# Patient Record
Sex: Female | Born: 1965
Health system: Southern US, Community
[De-identification: ages and names within clinical notes are randomized; demographics above are authoritative.]

## PROBLEM LIST (undated history)

## (undated) ENCOUNTER — Emergency Department (HOSPITAL_COMMUNITY): Discharge status: Against Medical Advice | Payer: Medicare Other

## (undated) DIAGNOSIS — I739 Peripheral vascular disease, unspecified: Secondary | ICD-10-CM

## (undated) DIAGNOSIS — F419 Anxiety disorder, unspecified: Secondary | ICD-10-CM

## (undated) DIAGNOSIS — K760 Fatty (change of) liver, not elsewhere classified: Secondary | ICD-10-CM

## (undated) DIAGNOSIS — J189 Pneumonia, unspecified organism: Secondary | ICD-10-CM

## (undated) DIAGNOSIS — R51 Headache: Secondary | ICD-10-CM

## (undated) DIAGNOSIS — K5792 Diverticulitis of intestine, part unspecified, without perforation or abscess without bleeding: Secondary | ICD-10-CM

## (undated) DIAGNOSIS — R451 Restlessness and agitation: Secondary | ICD-10-CM

## (undated) DIAGNOSIS — E119 Type 2 diabetes mellitus without complications: Secondary | ICD-10-CM

## (undated) DIAGNOSIS — T4145XA Adverse effect of unspecified anesthetic, initial encounter: Secondary | ICD-10-CM

## (undated) DIAGNOSIS — C801 Malignant (primary) neoplasm, unspecified: Secondary | ICD-10-CM

## (undated) DIAGNOSIS — K219 Gastro-esophageal reflux disease without esophagitis: Secondary | ICD-10-CM

## (undated) DIAGNOSIS — T8859XA Other complications of anesthesia, initial encounter: Secondary | ICD-10-CM

## (undated) DIAGNOSIS — E785 Hyperlipidemia, unspecified: Secondary | ICD-10-CM

## (undated) DIAGNOSIS — I10 Essential (primary) hypertension: Secondary | ICD-10-CM

## (undated) DIAGNOSIS — Z87442 Personal history of urinary calculi: Secondary | ICD-10-CM

## (undated) DIAGNOSIS — F32A Depression, unspecified: Secondary | ICD-10-CM

## (undated) DIAGNOSIS — J449 Chronic obstructive pulmonary disease, unspecified: Secondary | ICD-10-CM

## (undated) DIAGNOSIS — I639 Cerebral infarction, unspecified: Secondary | ICD-10-CM

## (undated) HISTORY — DX: Peripheral vascular disease, unspecified: I73.9

## (undated) HISTORY — PX: REPLACEMENT TOTAL KNEE BILATERAL: SUR1225

## (undated) HISTORY — PX: HERNIA REPAIR: SHX51

## (undated) HISTORY — PX: BACK SURGERY: SHX140

## (undated) HISTORY — DX: Hyperlipidemia, unspecified: E78.5

## (undated) HISTORY — PX: ABDOMINAL HYSTERECTOMY: SHX81

## (undated) HISTORY — PX: EYE SURGERY: SHX253

## (undated) HISTORY — PX: JOINT REPLACEMENT: SHX530

## (undated) HISTORY — DX: Depression, unspecified: F32.A

## (undated) SURGERY — Surgical Case
Anesthesia: *Unknown

## (undated) SURGICAL SUPPLY — 11 items
BALLN LUTONIX 018 6X40X130 (BALLOONS) ×1 IMPLANT
BALLN LUTONIX DCB 4X100X130 (BALLOONS) ×1 IMPLANT
BALLN LUTONIX DCB 5X80X130 (BALLOONS) ×1 IMPLANT
DRSG SURGICEL FIBRILLAR 1X2 (HEMOSTASIS) ×1 IMPLANT
ELECT REM PT RETURN 9FT ADLT (ELECTROSURGICAL) ×1 IMPLANT
GLOVE SURG SYN 8.0 (GLOVE) ×1 IMPLANT
GRAFT VASC PATCH XENOSURE 1X14 (Vascular Products) IMPLANT
KIT STIMULAN RAPID CURE 5CC (Orthopedic Implant) IMPLANT
NDL HYPO 18GX1.5 BLUNT FILL (NEEDLE) ×1 IMPLANT
SUT ETHILON 3-0 FS-10 30 BLK (SUTURE) ×1 IMPLANT
TAG SUTURE CLAMP YLW 5PR (MISCELLANEOUS) ×1 IMPLANT

---

## 1999-07-15 ENCOUNTER — Emergency Department (HOSPITAL_COMMUNITY): Admission: EM | Admit: 1999-07-15 | Discharge: 1999-07-15 | Payer: Self-pay | Admitting: Emergency Medicine

## 1999-07-15 ENCOUNTER — Encounter: Payer: Self-pay | Admitting: Emergency Medicine

## 2001-01-15 ENCOUNTER — Encounter: Payer: Self-pay | Admitting: Emergency Medicine

## 2001-01-15 ENCOUNTER — Emergency Department (HOSPITAL_COMMUNITY): Admission: EM | Admit: 2001-01-15 | Discharge: 2001-01-15 | Payer: Self-pay | Admitting: Emergency Medicine

## 2001-06-21 ENCOUNTER — Emergency Department (HOSPITAL_COMMUNITY): Admission: EM | Admit: 2001-06-21 | Discharge: 2001-06-21 | Payer: Self-pay | Admitting: Emergency Medicine

## 2001-06-21 ENCOUNTER — Encounter: Payer: Self-pay | Admitting: Emergency Medicine

## 2001-06-23 ENCOUNTER — Encounter: Payer: Self-pay | Admitting: Emergency Medicine

## 2001-06-23 ENCOUNTER — Emergency Department (HOSPITAL_COMMUNITY): Admission: EM | Admit: 2001-06-23 | Discharge: 2001-06-23 | Payer: Self-pay | Admitting: Emergency Medicine

## 2003-11-16 ENCOUNTER — Ambulatory Visit: Payer: Self-pay | Admitting: Internal Medicine

## 2003-12-14 ENCOUNTER — Ambulatory Visit: Payer: Self-pay | Admitting: Internal Medicine

## 2004-04-22 ENCOUNTER — Emergency Department: Payer: Self-pay | Admitting: Emergency Medicine

## 2005-03-27 ENCOUNTER — Inpatient Hospital Stay: Payer: Self-pay

## 2005-03-27 ENCOUNTER — Other Ambulatory Visit: Payer: Self-pay

## 2005-05-13 ENCOUNTER — Other Ambulatory Visit: Payer: Self-pay

## 2005-05-13 ENCOUNTER — Emergency Department: Payer: Self-pay | Admitting: Emergency Medicine

## 2005-10-29 ENCOUNTER — Emergency Department: Payer: Self-pay | Admitting: General Practice

## 2005-11-15 ENCOUNTER — Other Ambulatory Visit: Payer: Self-pay

## 2005-11-15 ENCOUNTER — Emergency Department: Payer: Self-pay | Admitting: Emergency Medicine

## 2006-02-26 ENCOUNTER — Encounter: Admission: RE | Admit: 2006-02-26 | Discharge: 2006-02-26 | Payer: Self-pay | Admitting: Family Medicine

## 2006-03-12 ENCOUNTER — Emergency Department: Payer: Self-pay | Admitting: Emergency Medicine

## 2006-04-09 ENCOUNTER — Encounter: Payer: Self-pay | Admitting: Neurosurgery

## 2006-04-14 ENCOUNTER — Encounter: Payer: Self-pay | Admitting: Neurosurgery

## 2006-06-30 ENCOUNTER — Encounter: Payer: Self-pay | Admitting: Orthopedic Surgery

## 2006-07-14 ENCOUNTER — Encounter: Payer: Self-pay | Admitting: Orthopedic Surgery

## 2006-08-26 ENCOUNTER — Ambulatory Visit: Payer: Self-pay | Admitting: Pain Medicine

## 2007-08-14 ENCOUNTER — Encounter: Payer: Self-pay | Admitting: *Deleted

## 2008-01-11 ENCOUNTER — Emergency Department (HOSPITAL_COMMUNITY): Admission: EM | Admit: 2008-01-11 | Discharge: 2008-01-11 | Payer: Self-pay | Admitting: Emergency Medicine

## 2008-04-06 ENCOUNTER — Inpatient Hospital Stay (HOSPITAL_COMMUNITY): Admission: RE | Admit: 2008-04-06 | Discharge: 2008-04-09 | Payer: Self-pay | Admitting: Neurosurgery

## 2008-07-05 ENCOUNTER — Encounter: Payer: Self-pay | Admitting: Neurosurgery

## 2008-07-13 ENCOUNTER — Encounter: Payer: Self-pay | Admitting: Neurosurgery

## 2008-07-20 ENCOUNTER — Encounter: Admission: RE | Admit: 2008-07-20 | Discharge: 2008-07-20 | Payer: Self-pay | Admitting: Neurosurgery

## 2008-09-13 ENCOUNTER — Encounter: Payer: Self-pay | Admitting: Neurosurgery

## 2008-10-13 ENCOUNTER — Encounter: Payer: Self-pay | Admitting: Neurosurgery

## 2009-03-02 ENCOUNTER — Encounter: Admission: RE | Admit: 2009-03-02 | Discharge: 2009-03-02 | Payer: Self-pay | Admitting: Neurosurgery

## 2009-06-01 ENCOUNTER — Ambulatory Visit (HOSPITAL_COMMUNITY): Admission: RE | Admit: 2009-06-01 | Discharge: 2009-06-01 | Payer: Self-pay | Admitting: Neurosurgery

## 2009-06-30 ENCOUNTER — Encounter: Admission: RE | Admit: 2009-06-30 | Discharge: 2009-06-30 | Payer: Self-pay | Admitting: Neurosurgery

## 2009-11-07 ENCOUNTER — Emergency Department: Payer: Self-pay | Admitting: Emergency Medicine

## 2010-01-16 ENCOUNTER — Encounter: Payer: Self-pay | Admitting: Anesthesiology

## 2010-01-24 ENCOUNTER — Ambulatory Visit: Payer: Self-pay

## 2010-01-28 ENCOUNTER — Ambulatory Visit: Payer: Self-pay | Admitting: Unknown Physician Specialty

## 2010-01-29 ENCOUNTER — Ambulatory Visit: Payer: Self-pay | Admitting: Unknown Physician Specialty

## 2010-02-03 ENCOUNTER — Encounter: Payer: Self-pay | Admitting: Neurosurgery

## 2010-02-13 ENCOUNTER — Encounter: Payer: Self-pay | Admitting: Anesthesiology

## 2010-03-14 ENCOUNTER — Encounter: Payer: Self-pay | Admitting: Surgery

## 2010-04-01 LAB — GLUCOSE, CAPILLARY: Glucose-Capillary: 370 mg/dL — ABNORMAL HIGH (ref 70–99)

## 2010-04-01 LAB — GLUCOSE, RANDOM: Glucose, Bld: 462 mg/dL — ABNORMAL HIGH (ref 70–99)

## 2010-04-14 ENCOUNTER — Encounter: Payer: Self-pay | Admitting: Surgery

## 2010-04-25 LAB — GLUCOSE, CAPILLARY
Glucose-Capillary: 120 mg/dL — ABNORMAL HIGH (ref 70–99)
Glucose-Capillary: 128 mg/dL — ABNORMAL HIGH (ref 70–99)
Glucose-Capillary: 149 mg/dL — ABNORMAL HIGH (ref 70–99)
Glucose-Capillary: 156 mg/dL — ABNORMAL HIGH (ref 70–99)
Glucose-Capillary: 177 mg/dL — ABNORMAL HIGH (ref 70–99)
Glucose-Capillary: 182 mg/dL — ABNORMAL HIGH (ref 70–99)
Glucose-Capillary: 218 mg/dL — ABNORMAL HIGH (ref 70–99)
Glucose-Capillary: 248 mg/dL — ABNORMAL HIGH (ref 70–99)
Glucose-Capillary: 84 mg/dL (ref 70–99)

## 2010-04-25 LAB — BASIC METABOLIC PANEL
Calcium: 8.4 mg/dL (ref 8.4–10.5)
Calcium: 9.3 mg/dL (ref 8.4–10.5)
Chloride: 97 mEq/L (ref 96–112)
Creatinine, Ser: 0.76 mg/dL (ref 0.4–1.2)
GFR calc Af Amer: >60 mL/min (ref >60)
GFR calc Af Amer: >60 mL/min (ref >60)
GFR calc non Af Amer: >60 mL/min (ref >60)
GFR calc non Af Amer: >60 mL/min (ref >60)
Sodium: 135 mEq/L (ref 135–145)

## 2010-04-25 LAB — DIFFERENTIAL
Basophils Absolute: 0.1 10*3/uL (ref 0.0–0.1)
Lymphocytes Relative: 35 % (ref 12–46)
Monocytes Absolute: 0.8 10*3/uL (ref 0.1–1.0)
Monocytes Relative: 6 % (ref 3–12)
Neutro Abs: 7.6 10*3/uL (ref 1.7–7.7)

## 2010-04-25 LAB — CBC
Hemoglobin: 14.9 g/dL (ref 12.0–15.0)
Platelets: 269 10*3/uL (ref 150–400)
RBC: 4.31 MIL/uL (ref 3.87–5.11)
RBC: 4.98 MIL/uL (ref 3.87–5.11)
RDW: 13.8 % (ref 11.5–15.5)
WBC: 16 10*3/uL — ABNORMAL HIGH (ref 4.0–10.5)

## 2010-04-25 LAB — ABO/RH: ABO/RH(D): A POS

## 2010-04-25 LAB — TYPE AND SCREEN: Antibody Screen: NEGATIVE

## 2010-05-28 NOTE — Op Note (Signed)
[name], [name]                ACCOUNT NO.:  [id number]   MEDICAL RECORD NO.:  [id number]          PATIENT TYPE:  INP   LOCATION:  3009                         FACILITY:  [facility]   PHYSICIAN:  Cristi Loron, M.D.DATE OF BIRTH:  November 17, 1965   DATE OF PROCEDURE:  04/06/2008  DATE OF DISCHARGE:                               OPERATIVE REPORT   BRIEF HISTORY:  The patient is a 45 year old white female who I have  seen in the past secondary to chronic back pain.  She failed extensive  nonsurgical management and was recently referred back to me for back  pain and ruptured disk at L4-L5.  I discussed the various treatments  with the patient including surgery.  She has weighed the risks,  benefits, and alternatives of surgery and decided to proceed with a L4-  L5 interbody fusion, instrumentation, etc.   PREOPERATIVE DIAGNOSES:  L4-L5 herniated nucleus pulposus, spinal  stenosis, degenerative disk disease, lumbar radiculopathy, and lumbago.   POSTOPERATIVE DIAGNOSES:  L4-L5 herniated nucleus pulposus, spinal  stenosis, degenerative disk disease, lumbar radiculopathy, and lumbago.   PROCEDURE:  Right L4-L5 transforaminal lumbar interbody fusion with  local morselized autograft bone and Actifuse bone graft extender;  insertion of L4-L5 interbody prosthesis (Capstone PEEK interbody  prosthesis); L4-L5 posterior nonsegmental instrumentation with legacy  titanium pedicle screws and rods; L4-L5 posterolateral arthrodesis with  local morselized autograft bone, Actifuse, and Vitoss bone graft  extenders.   SURGEON:  Cristi Loron, MD   ASSISTANT:  Hilda Lias, MD   ANESTHESIA:  General endotracheal.   ESTIMATED BLOOD LOSS:  200 mL.   SPECIMENS:  None.   DRAINS:  None.   COMPLICATIONS:  None.   DESCRIPTION OF PROCEDURE:  The patient was brought to the operating room  by the Anesthesia Team.  General endotracheal anesthesia was induced.  The patient was turned on the [place]  frame.  Her lumbosacral region was  then prepared with Betadine scrub and Betadine solution.  Sterile drapes  were applied.  I injected the area to be incised with Marcaine with  epinephrine solution.  I used a scalpel to make a linear midline  incision over the L4-L5 interspace.  I used electrocautery to perform a  bilateral subperiosteal dissection exposing the spinous process and  lamina of L3, L4, L5, and the upper sacrum.  We obtained intraoperative  radiograph to confirm our location.  We then inserted the first  retractor for exposure.   We then used a high-speed drill to perform a right L4 laminotomy.  We  removed the medial aspect of the right L4-L5 facet joint.  We removed  the L4-L5 ligamentum flavum.  We performed foraminotomies about the  right L4 and L5 nerve roots.  This gave [place] lateral exposure to the L4-L5  intervertebral disk.  We incised this with a #15 blade scalpel and  performed a partial intervertebral discectomy with pituitary forceps and  curettes.  We prepared the vertebral endplates for the fusion by using  curettes to remove the soft tissue.  We then used the trial spacers and  determined to use  a 10 x 26 mm PEEK interbody prosthesis.  We prefilled  this prosthesis with local morselized autograft bone and Actifuse bone  graft extender.  Dr. Jeral Fruit then gently retracted the thecal sac  medially and I inserted the prosthesis into the interspace.  I then  turned it laterally using the bone tamps.  I should also mention we  filled anterior, posterior, and laterally to the prosthesis with a  combination of local morselized autograft bone and Actifuse bone graft  extender.  This completed the posterior lumbar interbody fusion.   We now turned our attention to posterior instrumentation.  Under  fluoroscopic guidance, we cannulated the bilateral L4-L5 pedicles with  bone probe.  We tapped the pedicles with a 6.5 mm tap and then probed  inside the tapped pedicles with  a ball probe to rule out cortical  breaches.  We then inserted 7.5 x 45 mm pedicle screws bilaterally at  the L4 and L5 pedicles under fluoroscopic guidance.  We palpated along  the aspect of the L4-L5 pedicles and noticed no cortical breeches.  We  then connected the lateral pedicle screws with lordotic rod.  We then  secured the rod in place with caps which were tightened appropriately.  This completed the instrumentation.   We now turned our attention to the posterolateral arthrodesis.  We used  high-speed drill to decorticate the remainder of the left L4-L5 facets  pars transverse process center.  We laid a combination of local  morselized autograft bone and Actifuse bone graft extender and Vitoss  bone graft extender over these decorticated posterolateral structures.  This completed the posterolateral arthrodesis.   We then obtained hemostasis using bipolar electrocautery.  We irrigated  the wound out with bacitracin solution.  We then inspected the thecal  sac and right L4-L5 nerve roots and they were well decompressed.  We  then removed the retractor and reapproximated the patient's  thoracolumbar fascia with interrupted #1 Vicryl suture, subcutaneous  tissue with interrupted 2-0 Vicryl suture, and the skin with Steri-  Strips and benzoin.  The wound was then coated with bacitracin ointment.  A sterile dressing was applied.  The drapes were removed.  The patient  was subsequently returned to supine position where she was extubated by  the Anesthesia Team and transported to the Postanesthesia Care Unit in  stable condition.  All sponge, instrument, and needle counts were  correct at the end of this case.      Cristi Loron, M.D.  Electronically Signed     JDJ/MEDQ  D:  04/06/2008  T:  04/07/2008  Job:  [id number]

## 2010-05-28 NOTE — Discharge Summary (Signed)
[name], [name]                ACCOUNT NO.:  [id number]   MEDICAL RECORD NO.:  [id number]          PATIENT TYPE:  INP   LOCATION:  3009                         FACILITY:  [facility]   PHYSICIAN:  Payton Doughty, M.D.      DATE OF BIRTH:  24-Jan-1965   DATE OF ADMISSION:  04/06/2008  DATE OF DISCHARGE:  04/09/2008                               DISCHARGE SUMMARY   ADMITTING DIAGNOSIS:  Spondylosis at L4-5, L5-S1.   DISCHARGE DIAGNOSIS:  Spondylosis at L4-5, L5-S1.   OPERATIVE PROCEDURES:  L4-5 fusion.   ATTENDING DOCTOR:  Cristi Loron, M.D.   COMPLICATIONS:  None.   DISCHARGE STATUS:  Alive and well.   BODY OF TEXT:  This is a 45 year old girl's history and physical is  recounted in the chart by Dr. Lovell Sheehan.  She had spondylosis at L4-5.  She was admitted and after ascertaining normal laboratory values  underwent L4-5 laminectomy and fusion.  Postoperatively, she has had  some back pain and leg pain seems better.  Her strength is full.  Her  incision is dry.  She had small amount of redness at the top of it.  She  had little bit of hypotension probably due to excessive medication use  which resolved when her medications were revised.  She is going to be  discharged on Percocet and Valium, also small amount of ciprofloxacin  for skin coverage.  Her followup will be in the [facility] in a  week with Dr. Lovell Sheehan.           ______________________________  Payton Doughty, M.D.     MWR/MEDQ  D:  04/09/2008  T:  04/09/2008  Job:  [id number]

## 2010-07-01 ENCOUNTER — Emergency Department: Payer: Self-pay | Admitting: *Deleted

## 2010-07-05 ENCOUNTER — Emergency Department: Payer: Self-pay | Admitting: Emergency Medicine

## 2010-07-12 ENCOUNTER — Ambulatory Visit: Payer: Self-pay | Admitting: Gastroenterology

## 2010-07-16 ENCOUNTER — Emergency Department: Payer: Self-pay | Admitting: Internal Medicine

## 2010-08-01 ENCOUNTER — Ambulatory Visit: Payer: Self-pay | Admitting: Unknown Physician Specialty

## 2010-08-08 ENCOUNTER — Inpatient Hospital Stay: Payer: Self-pay | Admitting: Unknown Physician Specialty

## 2010-09-19 ENCOUNTER — Encounter: Payer: Self-pay | Admitting: Unknown Physician Specialty

## 2010-10-14 ENCOUNTER — Encounter: Payer: Self-pay | Admitting: Unknown Physician Specialty

## 2010-11-14 ENCOUNTER — Encounter: Payer: Self-pay | Admitting: Unknown Physician Specialty

## 2010-12-14 ENCOUNTER — Emergency Department: Payer: Self-pay | Admitting: Emergency Medicine

## 2011-02-03 ENCOUNTER — Emergency Department: Payer: Self-pay | Admitting: Emergency Medicine

## 2011-02-21 ENCOUNTER — Other Ambulatory Visit: Payer: Self-pay | Admitting: Neurosurgery

## 2011-02-21 DIAGNOSIS — M549 Dorsalgia, unspecified: Secondary | ICD-10-CM

## 2011-02-28 ENCOUNTER — Ambulatory Visit
Admission: RE | Admit: 2011-02-28 | Discharge: 2011-02-28 | Disposition: A | Discharge status: Home / Self Care | Payer: Medicare Other | Source: Ambulatory Visit | Attending: Neurosurgery | Admitting: Neurosurgery

## 2011-02-28 DIAGNOSIS — M549 Dorsalgia, unspecified: Secondary | ICD-10-CM

## 2011-02-28 MED ORDER — GADOBENATE DIMEGLUMINE 529 MG/ML IV SOLN
20.0000 mL | Freq: Once | INTRAVENOUS | Status: AC | PRN
  Administered 2011-02-28: 20 mL via INTRAVENOUS

## 2011-04-07 ENCOUNTER — Ambulatory Visit: Payer: Self-pay | Admitting: Anesthesiology

## 2011-04-07 LAB — BASIC METABOLIC PANEL
BUN: 11 mg/dL (ref 7–18)
Calcium, Total: 8.7 mg/dL (ref 8.5–10.1)
Co2: 28 mmol/L (ref 21–32)
Creatinine: 0.65 mg/dL (ref 0.60–1.30)
EGFR (African American): >60
Glucose: 298 mg/dL — ABNORMAL HIGH (ref 65–99)
Potassium: 4 mmol/L (ref 3.5–5.1)

## 2011-04-10 ENCOUNTER — Ambulatory Visit: Payer: Self-pay | Admitting: Unknown Physician Specialty

## 2011-05-22 ENCOUNTER — Encounter: Payer: Self-pay | Admitting: Unknown Physician Specialty

## 2011-05-23 ENCOUNTER — Ambulatory Visit: Payer: Self-pay | Admitting: Orthopedic Surgery

## 2011-06-21 ENCOUNTER — Emergency Department: Payer: Self-pay | Admitting: Unknown Physician Specialty

## 2011-06-26 ENCOUNTER — Encounter: Payer: Self-pay | Admitting: Unknown Physician Specialty

## 2011-07-15 ENCOUNTER — Encounter: Payer: Self-pay | Admitting: Unknown Physician Specialty

## 2011-07-29 ENCOUNTER — Ambulatory Visit: Payer: Self-pay | Admitting: Orthopedic Surgery

## 2011-08-14 ENCOUNTER — Encounter: Payer: Self-pay | Admitting: Unknown Physician Specialty

## 2011-08-25 ENCOUNTER — Ambulatory Visit: Payer: Self-pay | Admitting: Orthopedic Surgery

## 2011-08-25 DIAGNOSIS — I10 Essential (primary) hypertension: Secondary | ICD-10-CM

## 2011-08-25 LAB — BASIC METABOLIC PANEL
BUN: 10 mg/dL (ref 7–18)
Calcium, Total: 8.9 mg/dL (ref 8.5–10.1)
Chloride: 101 mmol/L (ref 98–107)
Creatinine: 0.8 mg/dL (ref 0.60–1.30)
EGFR (African American): >60
EGFR (Non-African Amer.): >60
Glucose: 177 mg/dL — ABNORMAL HIGH (ref 65–99)
Osmolality: 277 (ref 275–301)
Potassium: 3.9 mmol/L (ref 3.5–5.1)

## 2011-08-25 LAB — PROTIME-INR
INR: 0.9
Prothrombin Time: 12.5 secs (ref 11.5–14.7)

## 2011-08-25 LAB — MRSA PCR SCREENING

## 2011-08-25 LAB — CBC
HGB: 14.8 g/dL (ref 12.0–16.0)
MCH: 29.5 pg (ref 26.0–34.0)
RBC: 5 10*6/uL (ref 3.80–5.20)
WBC: 15.6 10*3/uL — ABNORMAL HIGH (ref 3.6–11.0)

## 2011-08-25 LAB — APTT: Activated PTT: 33.9 secs (ref 23.6–35.9)

## 2011-09-04 ENCOUNTER — Inpatient Hospital Stay: Payer: Self-pay

## 2011-09-05 LAB — BASIC METABOLIC PANEL
Anion Gap: 5 — ABNORMAL LOW (ref 7–16)
BUN: 3 mg/dL — ABNORMAL LOW (ref 7–18)
EGFR (African American): >60
EGFR (Non-African Amer.): >60
Glucose: 202 mg/dL — ABNORMAL HIGH (ref 65–99)
Osmolality: 269 (ref 275–301)
Potassium: 3.4 mmol/L — ABNORMAL LOW (ref 3.5–5.1)

## 2011-09-05 LAB — PLATELET COUNT: Platelet: 274 10*3/uL (ref 150–440)

## 2011-09-05 LAB — HEMOGLOBIN: HGB: 13 g/dL (ref 12.0–16.0)

## 2011-09-06 LAB — BASIC METABOLIC PANEL
BUN: 8 mg/dL (ref 7–18)
Creatinine: 0.84 mg/dL (ref 0.60–1.30)
EGFR (African American): >60
Glucose: 134 mg/dL — ABNORMAL HIGH (ref 65–99)
Potassium: 3.6 mmol/L (ref 3.5–5.1)
Sodium: 137 mmol/L (ref 136–145)

## 2012-01-05 ENCOUNTER — Ambulatory Visit: Payer: Self-pay | Admitting: Family Medicine

## 2012-03-03 ENCOUNTER — Other Ambulatory Visit: Payer: Self-pay | Admitting: Neurosurgery

## 2012-03-09 ENCOUNTER — Encounter (HOSPITAL_COMMUNITY)
Admission: RE | Admit: 2012-03-09 | Discharge: 2012-03-09 | Disposition: A | Discharge status: Home / Self Care | Payer: Medicare Other | Source: Ambulatory Visit | Attending: Neurosurgery | Admitting: Neurosurgery

## 2012-03-09 ENCOUNTER — Encounter (HOSPITAL_COMMUNITY): Payer: Self-pay

## 2012-03-09 HISTORY — DX: Adverse effect of unspecified anesthetic, initial encounter: T41.45XA

## 2012-03-09 HISTORY — DX: Anxiety disorder, unspecified: F41.9

## 2012-03-09 HISTORY — DX: Type 2 diabetes mellitus without complications: E11.9

## 2012-03-09 HISTORY — DX: Headache: R51

## 2012-03-09 HISTORY — DX: Chronic obstructive pulmonary disease, unspecified: J44.9

## 2012-03-09 HISTORY — DX: Other complications of anesthesia, initial encounter: T88.59XA

## 2012-03-09 HISTORY — DX: Restlessness and agitation: R45.1

## 2012-03-09 LAB — BASIC METABOLIC PANEL
BUN: 9 mg/dL (ref 6–23)
CO2: 24 mEq/L (ref 19–32)
Calcium: 9.6 mg/dL (ref 8.4–10.5)
Creatinine, Ser: 0.63 mg/dL (ref 0.50–1.10)
Glucose, Bld: 319 mg/dL — ABNORMAL HIGH (ref 70–99)

## 2012-03-09 LAB — CBC
MCH: 29.4 pg (ref 26.0–34.0)
MCV: 85.3 fL (ref 78.0–100.0)
Platelets: 342 10*3/uL (ref 150–400)
RDW: 13.9 % (ref 11.5–15.5)

## 2012-03-09 NOTE — Pre-Procedure Instructions (Signed)
ANEVAY CAMPANELLA  03/09/2012   Your procedure is scheduled on:  03/11/12  Report to [facility] at 1230 AM.  Call this number if you have problems the morning of surgery: [phone]   Remember:   Do not eat food or drink liquids after midnight.   Take these medicines the morning of surgery with A SIP OF WATER: hydrocodone,xanax,neurontin   Do not wear jewelry, make-up or nail polish.  Do not wear lotions, powders, or perfumes. You may wear deodorant.  Do not shave 48 hours prior to surgery. Men may shave face and neck.  Do not bring valuables to the hospital.  Contacts, dentures or bridgework may not be worn into surgery.  Leave suitcase in the car. After surgery it may be brought to your room.  For patients admitted to the hospital, checkout time is 11:00 AM the day of  discharge.   Patients discharged the day of surgery will not be allowed to drive  home.  Name and phone number of your driver: family  Special Instructions: Shower using CHG 2 nights before surgery and the night before surgery.  If you shower the day of surgery use CHG.  Use special wash - you have one bottle of CHG for all showers.  You should use approximately 1/3 of the bottle for each shower.   Please read over the following fact sheets that you were given: Pain Booklet, Coughing and Deep Breathing, MRSA Information and Surgical Site Infection Prevention

## 2012-03-10 MED ORDER — CEFAZOLIN SODIUM-DEXTROSE 2-3 GM-% IV SOLR: 2.0000 g | INTRAVENOUS | Status: DC

## 2012-03-10 MED ORDER — CEFAZOLIN SODIUM 10 G IJ SOLR
3.0000 g | INTRAMUSCULAR | Status: AC
  Administered 2012-03-11: 3 g via INTRAVENOUS
  Filled 2012-03-10: qty 3000

## 2012-03-10 NOTE — Progress Notes (Addendum)
Anesthesia chart review: Patient is a 47 year old female scheduled for C5-6 ACDF by Dr. Lovell Sheehan on 03/11/2012. History includes L4-5 fusion '10, morbid obesity, diabetes mellitus type 2, smoking, COPD, obstructive sleep apnea, anxiety, headaches.  EKG on 03/08/2012 showed normal sinus rhythm  Chest x-ray on 03/09/2012 showed no acute cardiopulmonary abnormality.  Preoperative labs noted. Nonfasting glucose 319, WBC 16.7.  I attempted to call patient to inquire more about her home fasting glucose readings, but the number listed is no longer in service.  Glucose and WBC results were called to Darl Pikes at [facility].  Patient will get a CBG on arrival.  Defer additional orders, if any, to Dr. Lovell Sheehan (Update: he requested a UA to be done on arrival).  If her glucose level tomorrow is reasonable then would anticipate she could proceed from an anesthesia standpoint.  Shonna Chock, PA-C 03/10/12 [phone]

## 2012-03-11 ENCOUNTER — Ambulatory Visit (HOSPITAL_COMMUNITY): Payer: Medicare Other | Admitting: Anesthesiology

## 2012-03-11 ENCOUNTER — Encounter (HOSPITAL_COMMUNITY)
Admission: RE | Disposition: A | Discharge status: Home / Self Care | Payer: Self-pay | Source: Ambulatory Visit | Attending: Neurosurgery

## 2012-03-11 ENCOUNTER — Inpatient Hospital Stay (HOSPITAL_COMMUNITY)
Admission: RE | Admit: 2012-03-11 | Discharge: 2012-03-12 | DRG: 473 | Disposition: A | Discharge status: Home / Self Care | Payer: Medicare Other | Source: Ambulatory Visit | Attending: Neurosurgery | Admitting: Neurosurgery

## 2012-03-11 ENCOUNTER — Encounter (HOSPITAL_COMMUNITY): Payer: Self-pay | Admitting: *Deleted

## 2012-03-11 ENCOUNTER — Encounter (HOSPITAL_COMMUNITY): Payer: Self-pay | Admitting: Vascular Surgery

## 2012-03-11 ENCOUNTER — Ambulatory Visit (HOSPITAL_COMMUNITY): Payer: Medicare Other

## 2012-03-11 DIAGNOSIS — M5000 Cervical disc disorder with myelopathy, unspecified cervical region: Secondary | ICD-10-CM

## 2012-03-11 DIAGNOSIS — J449 Chronic obstructive pulmonary disease, unspecified: Secondary | ICD-10-CM | POA: Diagnosis present

## 2012-03-11 DIAGNOSIS — M5020 Other cervical disc displacement, unspecified cervical region: Principal | ICD-10-CM | POA: Diagnosis present

## 2012-03-11 DIAGNOSIS — M47812 Spondylosis without myelopathy or radiculopathy, cervical region: Secondary | ICD-10-CM | POA: Diagnosis present

## 2012-03-11 DIAGNOSIS — Z01810 Encounter for preprocedural cardiovascular examination: Secondary | ICD-10-CM

## 2012-03-11 DIAGNOSIS — Z01812 Encounter for preprocedural laboratory examination: Secondary | ICD-10-CM

## 2012-03-11 DIAGNOSIS — Z79899 Other long term (current) drug therapy: Secondary | ICD-10-CM

## 2012-03-11 DIAGNOSIS — Z01818 Encounter for other preprocedural examination: Secondary | ICD-10-CM

## 2012-03-11 DIAGNOSIS — Z96659 Presence of unspecified artificial knee joint: Secondary | ICD-10-CM

## 2012-03-11 DIAGNOSIS — F17200 Nicotine dependence, unspecified, uncomplicated: Secondary | ICD-10-CM | POA: Diagnosis present

## 2012-03-11 DIAGNOSIS — G4730 Sleep apnea, unspecified: Secondary | ICD-10-CM | POA: Diagnosis present

## 2012-03-11 DIAGNOSIS — E119 Type 2 diabetes mellitus without complications: Secondary | ICD-10-CM | POA: Diagnosis present

## 2012-03-11 DIAGNOSIS — J4489 Other specified chronic obstructive pulmonary disease: Secondary | ICD-10-CM | POA: Diagnosis present

## 2012-03-11 DIAGNOSIS — M1990 Unspecified osteoarthritis, unspecified site: Secondary | ICD-10-CM | POA: Diagnosis present

## 2012-03-11 DIAGNOSIS — F411 Generalized anxiety disorder: Secondary | ICD-10-CM | POA: Diagnosis present

## 2012-03-11 HISTORY — PX: ANTERIOR CERVICAL DECOMP/DISCECTOMY FUSION: SHX1161

## 2012-03-11 LAB — GLUCOSE, CAPILLARY
Glucose-Capillary: 282 mg/dL — ABNORMAL HIGH (ref 70–99)
Glucose-Capillary: 351 mg/dL — ABNORMAL HIGH (ref 70–99)
Glucose-Capillary: 360 mg/dL — ABNORMAL HIGH (ref 70–99)

## 2012-03-11 LAB — URINE MICROSCOPIC-ADD ON

## 2012-03-11 LAB — URINALYSIS, ROUTINE W REFLEX MICROSCOPIC
Bilirubin Urine: NEGATIVE
Ketones, ur: 15 mg/dL — AB
Nitrite: NEGATIVE
Urobilinogen, UA: 0.2 mg/dL (ref 0.0–1.0)
pH: 5.5 (ref 5.0–8.0)

## 2012-03-11 SURGERY — ANTERIOR CERVICAL DECOMPRESSION/DISCECTOMY FUSION 1 LEVEL
Anesthesia: General | Site: Neck | Wound class: Clean

## 2012-03-11 MED ORDER — BACITRACIN 50000 UNITS IM SOLR
INTRAMUSCULAR | Status: AC
  Filled 2012-03-11: qty 1

## 2012-03-11 MED ORDER — THROMBIN 5000 UNITS EX SOLR
CUTANEOUS | Status: DC | PRN
  Administered 2012-03-11 (×2): 5000 [IU] via TOPICAL

## 2012-03-11 MED ORDER — ONDANSETRON HCL 4 MG/2ML IJ SOLN
INTRAMUSCULAR | Status: DC | PRN
  Administered 2012-03-11: 4 mg via INTRAVENOUS

## 2012-03-11 MED ORDER — 0.9 % SODIUM CHLORIDE (POUR BTL) OPTIME
TOPICAL | Status: DC | PRN
  Administered 2012-03-11: 1000 mL

## 2012-03-11 MED ORDER — OXYCODONE HCL 5 MG PO TABS
5.0000 mg | ORAL_TABLET | Freq: Once | ORAL | Status: AC | PRN
Start: 2012-03-11 — End: 2012-03-11
  Administered 2012-03-11: 5 mg via ORAL

## 2012-03-11 MED ORDER — HEMOSTATIC AGENTS (NO CHARGE) OPTIME
TOPICAL | Status: DC | PRN
  Administered 2012-03-11: 1 via TOPICAL

## 2012-03-11 MED ORDER — LIDOCAINE HCL 4 % MT SOLN
OROMUCOSAL | Status: DC | PRN
  Administered 2012-03-11: 4 mL via TOPICAL

## 2012-03-11 MED ORDER — OXYCODONE HCL 5 MG/5ML PO SOLN: 5.0000 mg | Freq: Once | ORAL | Status: AC | PRN

## 2012-03-11 MED ORDER — METFORMIN HCL 500 MG PO TABS
500.0000 mg | ORAL_TABLET | Freq: Two times a day (BID) | ORAL | Status: DC
  Administered 2012-03-12: 500 mg via ORAL
  Filled 2012-03-11 (×4): qty 1

## 2012-03-11 MED ORDER — GABAPENTIN 600 MG PO TABS
600.0000 mg | ORAL_TABLET | Freq: Four times a day (QID) | ORAL | Status: DC
  Administered 2012-03-11 – 2012-03-12 (×2): 600 mg via ORAL
  Filled 2012-03-11 (×5): qty 1

## 2012-03-11 MED ORDER — LIDOCAINE HCL (CARDIAC) 20 MG/ML IV SOLN
INTRAVENOUS | Status: DC | PRN
  Administered 2012-03-11: 100 mg via INTRAVENOUS

## 2012-03-11 MED ORDER — ALUM & MAG HYDROXIDE-SIMETH 200-200-20 MG/5ML PO SUSP: 30.0000 mL | Freq: Four times a day (QID) | ORAL | Status: DC | PRN

## 2012-03-11 MED ORDER — CEFAZOLIN SODIUM-DEXTROSE 2-3 GM-% IV SOLR
2.0000 g | Freq: Three times a day (TID) | INTRAVENOUS | Status: AC
  Administered 2012-03-11 – 2012-03-12 (×2): 2 g via INTRAVENOUS
  Filled 2012-03-11 (×2): qty 50

## 2012-03-11 MED ORDER — MORPHINE SULFATE 2 MG/ML IJ SOLN
1.0000 mg | INTRAMUSCULAR | Status: DC | PRN
  Administered 2012-03-11: 2 mg via INTRAVENOUS
  Administered 2012-03-11 – 2012-03-12 (×3): 4 mg via INTRAVENOUS
  Filled 2012-03-11 (×2): qty 2
  Filled 2012-03-11: qty 1
  Filled 2012-03-11: qty 2

## 2012-03-11 MED ORDER — NEOSTIGMINE METHYLSULFATE 1 MG/ML IJ SOLN
INTRAMUSCULAR | Status: DC | PRN
  Administered 2012-03-11: 4 mg via INTRAVENOUS

## 2012-03-11 MED ORDER — BUPIVACAINE-EPINEPHRINE PF 0.5-1:200000 % IJ SOLN
INTRAMUSCULAR | Status: DC | PRN
  Administered 2012-03-11: 10 mL

## 2012-03-11 MED ORDER — HYDROMORPHONE HCL PF 1 MG/ML IJ SOLN
INTRAMUSCULAR | Status: AC
  Administered 2012-03-11: 0.5 mg via INTRAVENOUS
  Filled 2012-03-11: qty 1

## 2012-03-11 MED ORDER — DIAZEPAM 5 MG PO TABS
ORAL_TABLET | ORAL | Status: AC
  Filled 2012-03-11: qty 1

## 2012-03-11 MED ORDER — HYDROMORPHONE HCL PF 1 MG/ML IJ SOLN
0.2500 mg | INTRAMUSCULAR | Status: DC | PRN
  Administered 2012-03-11 (×3): 0.5 mg via INTRAVENOUS

## 2012-03-11 MED ORDER — SODIUM CHLORIDE 0.9 % IV SOLN
INTRAVENOUS | Status: AC
  Filled 2012-03-11: qty 500

## 2012-03-11 MED ORDER — ALPRAZOLAM 0.5 MG PO TABS
1.0000 mg | ORAL_TABLET | Freq: Three times a day (TID) | ORAL | Status: DC | PRN
  Administered 2012-03-11 – 2012-03-12 (×2): 1 mg via ORAL
  Filled 2012-03-11 (×2): qty 2

## 2012-03-11 MED ORDER — HYDROMORPHONE HCL PF 1 MG/ML IJ SOLN
INTRAMUSCULAR | Status: AC
  Filled 2012-03-11: qty 1

## 2012-03-11 MED ORDER — DOCUSATE SODIUM 100 MG PO CAPS
100.0000 mg | ORAL_CAPSULE | Freq: Two times a day (BID) | ORAL | Status: DC
  Administered 2012-03-11 – 2012-03-12 (×2): 100 mg via ORAL
  Filled 2012-03-11 (×2): qty 1

## 2012-03-11 MED ORDER — PROPOFOL 10 MG/ML IV BOLUS
INTRAVENOUS | Status: DC | PRN
  Administered 2012-03-11: 200 mg via INTRAVENOUS

## 2012-03-11 MED ORDER — FENTANYL CITRATE 0.05 MG/ML IJ SOLN
INTRAMUSCULAR | Status: DC | PRN
  Administered 2012-03-11 (×2): 50 ug via INTRAVENOUS
  Administered 2012-03-11 (×2): 100 ug via INTRAVENOUS
  Administered 2012-03-11: 50 ug via INTRAVENOUS

## 2012-03-11 MED ORDER — ROCURONIUM BROMIDE 100 MG/10ML IV SOLN
INTRAVENOUS | Status: DC | PRN
  Administered 2012-03-11: 50 mg via INTRAVENOUS

## 2012-03-11 MED ORDER — INSULIN ASPART 100 UNIT/ML ~~LOC~~ SOLN: 5.0000 [IU] | Freq: Once | SUBCUTANEOUS | Status: AC

## 2012-03-11 MED ORDER — PROMETHAZINE HCL 25 MG/ML IJ SOLN: 6.2500 mg | INTRAMUSCULAR | Status: DC | PRN

## 2012-03-11 MED ORDER — DIAZEPAM 5 MG PO TABS
5.0000 mg | ORAL_TABLET | Freq: Four times a day (QID) | ORAL | Status: DC | PRN
  Administered 2012-03-11 – 2012-03-12 (×2): 5 mg via ORAL
  Filled 2012-03-11: qty 1

## 2012-03-11 MED ORDER — ACETAMINOPHEN 650 MG RE SUPP: 650.0000 mg | RECTAL | Status: DC | PRN

## 2012-03-11 MED ORDER — SIMVASTATIN 20 MG PO TABS
20.0000 mg | ORAL_TABLET | Freq: Every day | ORAL | Status: DC
  Administered 2012-03-11: 20 mg via ORAL
  Filled 2012-03-11 (×2): qty 1

## 2012-03-11 MED ORDER — LACTATED RINGERS IV SOLN
INTRAVENOUS | Status: DC | PRN
  Administered 2012-03-11 (×2): via INTRAVENOUS

## 2012-03-11 MED ORDER — SODIUM CHLORIDE 0.9 % IR SOLN
Status: DC | PRN
  Administered 2012-03-11: 16:00:00

## 2012-03-11 MED ORDER — ARTIFICIAL TEARS OP OINT
TOPICAL_OINTMENT | OPHTHALMIC | Status: DC | PRN
  Administered 2012-03-11: 1 via OPHTHALMIC

## 2012-03-11 MED ORDER — PHENOL 1.4 % MT LIQD
1.0000 | OROMUCOSAL | Status: DC | PRN
  Administered 2012-03-12 (×2): 1 via OROMUCOSAL
  Filled 2012-03-11: qty 177

## 2012-03-11 MED ORDER — MEPERIDINE HCL 25 MG/ML IJ SOLN: 6.2500 mg | INTRAMUSCULAR | Status: DC | PRN

## 2012-03-11 MED ORDER — LACTATED RINGERS IV SOLN: INTRAVENOUS | Status: DC

## 2012-03-11 MED ORDER — GLYBURIDE 2.5 MG PO TABS
2.5000 mg | ORAL_TABLET | Freq: Two times a day (BID) | ORAL | Status: DC
  Administered 2012-03-12: 2.5 mg via ORAL
  Filled 2012-03-11 (×4): qty 1

## 2012-03-11 MED ORDER — GLYCOPYRROLATE 0.2 MG/ML IJ SOLN
INTRAMUSCULAR | Status: DC | PRN
  Administered 2012-03-11: .4 mg via INTRAVENOUS

## 2012-03-11 MED ORDER — MIDAZOLAM HCL 5 MG/5ML IJ SOLN
INTRAMUSCULAR | Status: DC | PRN
  Administered 2012-03-11: 2 mg via INTRAVENOUS

## 2012-03-11 MED ORDER — GLYBURIDE-METFORMIN 2.5-500 MG PO TABS: 2.0000 | ORAL_TABLET | Freq: Two times a day (BID) | ORAL | Status: DC

## 2012-03-11 MED ORDER — MENTHOL 3 MG MT LOZG
1.0000 | LOZENGE | OROMUCOSAL | Status: DC | PRN
  Filled 2012-03-11: qty 9

## 2012-03-11 MED ORDER — DEXAMETHASONE SODIUM PHOSPHATE 4 MG/ML IJ SOLN
INTRAMUSCULAR | Status: DC | PRN
  Administered 2012-03-11: 4 mg via INTRAVENOUS

## 2012-03-11 MED ORDER — SIMVASTATIN 20 MG PO TABS: 20.0000 mg | ORAL_TABLET | Freq: Every day | ORAL | Status: DC

## 2012-03-11 MED ORDER — ONDANSETRON HCL 4 MG/2ML IJ SOLN: 4.0000 mg | INTRAMUSCULAR | Status: DC | PRN

## 2012-03-11 MED ORDER — ALBUTEROL SULFATE HFA 108 (90 BASE) MCG/ACT IN AERS: 2.0000 | INHALATION_SPRAY | Freq: Four times a day (QID) | RESPIRATORY_TRACT | Status: DC | PRN

## 2012-03-11 MED ORDER — OXYCODONE-ACETAMINOPHEN 5-325 MG PO TABS
1.0000 | ORAL_TABLET | ORAL | Status: DC | PRN
  Administered 2012-03-11: 1 via ORAL
  Administered 2012-03-12 (×3): 2 via ORAL
  Filled 2012-03-11 (×3): qty 2
  Filled 2012-03-11: qty 1

## 2012-03-11 MED ORDER — BACITRACIN ZINC 500 UNIT/GM EX OINT
TOPICAL_OINTMENT | CUTANEOUS | Status: DC | PRN
  Administered 2012-03-11: 1 via TOPICAL

## 2012-03-11 MED ORDER — HYDROCODONE-ACETAMINOPHEN 5-325 MG PO TABS: 1.0000 | ORAL_TABLET | ORAL | Status: DC | PRN

## 2012-03-11 MED ORDER — INSULIN ASPART 100 UNIT/ML ~~LOC~~ SOLN
SUBCUTANEOUS | Status: AC
  Administered 2012-03-11: 5 [IU] via SUBCUTANEOUS
  Filled 2012-03-11: qty 1

## 2012-03-11 MED ORDER — ACETAMINOPHEN 325 MG PO TABS: 650.0000 mg | ORAL_TABLET | ORAL | Status: DC | PRN

## 2012-03-11 MED ORDER — OXYCODONE HCL 5 MG PO TABS
ORAL_TABLET | ORAL | Status: AC
  Filled 2012-03-11: qty 1

## 2012-03-11 SURGICAL SUPPLY — 60 items
APL SKNCLS STERI-STRIP NONHPOA (GAUZE/BANDAGES/DRESSINGS) ×1
BAG DECANTER FOR FLEXI CONT (MISCELLANEOUS) ×2 IMPLANT
BENZOIN TINCTURE PRP APPL 2/3 (GAUZE/BANDAGES/DRESSINGS) ×2 IMPLANT
BIT DRILL NEURO 2X3.1 SFT TUCH (MISCELLANEOUS) ×1 IMPLANT
BLADE SURG 15 STRL LF DISP TIS (BLADE) ×1 IMPLANT
BLADE SURG 15 STRL SS (BLADE)
BLADE ULTRA TIP 2M (BLADE) ×2 IMPLANT
BRUSH SCRUB EZ PLAIN DRY (MISCELLANEOUS) ×2 IMPLANT
BUR BARREL STRAIGHT FLUTE 4.0 (BURR) ×2 IMPLANT
BUR MATCHSTICK NEURO 3.0X3.8 (BURR) ×1 IMPLANT
CANISTER SUCTION 2500CC (MISCELLANEOUS) ×2 IMPLANT
CLOTH BEACON ORANGE TIMEOUT ST (SAFETY) ×2 IMPLANT
CONT SPEC 4OZ CLIKSEAL STRL BL (MISCELLANEOUS) ×2 IMPLANT
COVER MAYO STAND STRL (DRAPES) ×2 IMPLANT
DRAPE LAPAROTOMY 100X72 PEDS (DRAPES) ×2 IMPLANT
DRAPE MICROSCOPE LEICA (MISCELLANEOUS) IMPLANT
DRAPE POUCH INSTRU U-SHP 10X18 (DRAPES) ×2 IMPLANT
DRAPE SURG 17X23 STRL (DRAPES) ×4 IMPLANT
DRILL NEURO 2X3.1 SOFT TOUCH (MISCELLANEOUS) ×2
ELECT REM PT RETURN 9FT ADLT (ELECTROSURGICAL) ×2
ELECTRODE REM PT RTRN 9FT ADLT (ELECTROSURGICAL) ×1 IMPLANT
GAUZE SPONGE 4X4 16PLY XRAY LF (GAUZE/BANDAGES/DRESSINGS) IMPLANT
GLOVE BIO SURGEON STRL SZ8.5 (GLOVE) ×2 IMPLANT
GLOVE BIOGEL PI IND STRL 8.5 (GLOVE) IMPLANT
GLOVE BIOGEL PI INDICATOR 8.5 (GLOVE) ×1
GLOVE EXAM NITRILE LRG STRL (GLOVE) IMPLANT
GLOVE EXAM NITRILE MD LF STRL (GLOVE) IMPLANT
GLOVE EXAM NITRILE XL STR (GLOVE) IMPLANT
GLOVE EXAM NITRILE XS STR PU (GLOVE) IMPLANT
GLOVE SS BIOGEL STRL SZ 8 (GLOVE) ×1 IMPLANT
GLOVE SUPERSENSE BIOGEL SZ 8 (GLOVE) ×1
GLOVE SURG SS PI 8.0 STRL IVOR (GLOVE) ×2 IMPLANT
GOWN BRE IMP SLV AUR LG STRL (GOWN DISPOSABLE) ×1 IMPLANT
GOWN BRE IMP SLV AUR XL STRL (GOWN DISPOSABLE) ×3 IMPLANT
KIT BASIN OR (CUSTOM PROCEDURE TRAY) ×2 IMPLANT
KIT ROOM TURNOVER OR (KITS) ×2 IMPLANT
MARKER SKIN DUAL TIP RULER LAB (MISCELLANEOUS) ×2 IMPLANT
NDL SPNL 18GX3.5 QUINCKE PK (NEEDLE) ×1 IMPLANT
NEEDLE HYPO 22GX1.5 SAFETY (NEEDLE) ×2 IMPLANT
NEEDLE SPNL 18GX3.5 QUINCKE PK (NEEDLE) ×2 IMPLANT
NS IRRIG 1000ML POUR BTL (IV SOLUTION) ×2 IMPLANT
PACK LAMINECTOMY NEURO (CUSTOM PROCEDURE TRAY) ×2 IMPLANT
PEEK VISTA 14X14X7MM (Peek) ×1 IMPLANT
PIN DISTRACTION 14MM (PIN) ×4 IMPLANT
PLATE ANT CERV XTEND 1 LV 14 (Plate) ×2 IMPLANT
PUTTY ABX ACTIFUSE 1.5ML (Putty) ×1 IMPLANT
RUBBERBAND STERILE (MISCELLANEOUS) IMPLANT
SCREW XTD VAR 4.2 SELF TAP 12 (Screw) ×4 IMPLANT
SPONGE GAUZE 4X4 12PLY (GAUZE/BANDAGES/DRESSINGS) ×2 IMPLANT
SPONGE INTESTINAL PEANUT (DISPOSABLE) ×4 IMPLANT
SPONGE SURGIFOAM ABS GEL SZ50 (HEMOSTASIS) ×2 IMPLANT
STRIP CLOSURE SKIN 1/2X4 (GAUZE/BANDAGES/DRESSINGS) ×2 IMPLANT
SUT VIC AB 0 CT1 27 (SUTURE) ×2
SUT VIC AB 0 CT1 27XBRD ANTBC (SUTURE) ×1 IMPLANT
SUT VIC AB 3-0 SH 8-18 (SUTURE) ×2 IMPLANT
SYR 20ML ECCENTRIC (SYRINGE) ×2 IMPLANT
TAPE CLOTH SURG 4X10 WHT LF (GAUZE/BANDAGES/DRESSINGS) ×2 IMPLANT
TOWEL OR 17X24 6PK STRL BLUE (TOWEL DISPOSABLE) ×2 IMPLANT
TOWEL OR 17X26 10 PK STRL BLUE (TOWEL DISPOSABLE) ×2 IMPLANT
WATER STERILE IRR 1000ML POUR (IV SOLUTION) ×2 IMPLANT

## 2012-03-11 NOTE — Progress Notes (Signed)
Subjective:  The patient is alert and pleasant. She is appropriately sore. She looks well.  Objective: Vital signs in last 24 hours: Temp:  [97.5 F (36.4 C)-97.6 F (36.4 C)] 97.6 F (36.4 C) (02/27 1635) Pulse Rate:  [93] 93 (02/27 1154) Resp:  [20] 20 (02/27 1154) BP: (125)/(88) 125/88 mmHg (02/27 1154) SpO2:  [95 %-98 %] 98 % (02/27 1635)  Intake/Output from previous day:   Intake/Output this shift: Total I/O In: 1000 [I.V.:1000] Out: 50 [Blood:50]  Physical exam the patient is alert and oriented. She is moving all 4 extremities well. Her dressing is clean and dry. There is no evidence of hematoma or shift.  Lab Results:  Recent Labs  03/09/12 1410  WBC 16.7*  HGB 16.8*  HCT 48.8*  PLT 342   BMET  Recent Labs  03/09/12 1410  NA 130*  K 4.1  CL 91*  CO2 24  GLUCOSE 319*  BUN 9  CREATININE 0.63  CALCIUM 9.6    Studies/Results: Dg Cervical Spine 2-3 Views  03/11/2012  *RADIOLOGY REPORT*  Clinical Data: Neck pain  CERVICAL SPINE - 2-3 VIEW  Comparison: Multiple priors  Findings: Film #1 demonstrates a needle at C3-C4.  Film #2 is insufficiently penetrated to show the surgical site.  IMPRESSION: As above.   Original Report Authenticated By: Davonna Belling, M.D.     Assessment/Plan: The patient is doing well.  LOS: 0 days     [name] D 03/11/2012, 4:58 PM

## 2012-03-11 NOTE — Progress Notes (Signed)
Patient blood sugar 379 upon arrival to short stay. Dr. Jacklynn Bue notified and orders received for 5 units of novolog SQ.

## 2012-03-11 NOTE — Anesthesia Postprocedure Evaluation (Signed)
  Anesthesia Post-op Note  Patient: [name]  Procedure(s) Performed: Procedure(s) with comments: ANTERIOR CERVICAL DECOMPRESSION/DISCECTOMY FUSION 1 LEVEL (N/A) - Cervical five-six anterior cervical decompression with fusion interbody prothesis plating and bonegraft  Patient Location: PACU  Anesthesia Type:General  Level of Consciousness: awake  Airway and Oxygen Therapy: Patient Spontanous Breathing  Post-op Pain: mild  Post-op Assessment: Post-op Vital signs reviewed  Post-op Vital Signs: stable  Complications: No apparent anesthesia complications

## 2012-03-11 NOTE — Preoperative (Signed)
Beta Blockers   Reason not to administer Beta Blockers:Not Applicable 

## 2012-03-11 NOTE — H&P (Signed)
Subjective: The patient is a 47 year old white female who has complained of neck and left arm pain. She has failed medical management and was worked up with a cervical MRI. This demonstrated a herniated disc at C5-6 on the left. I discussed the various treatment options with the patient including surgery. She has weighed the risks, benefits, and alternatives surgery and decided proceed with a C5-6 anterior cervical discectomy, fusion, and plating.   Past Medical History  Diagnosis Date  . Diabetes mellitus without complication   . Anxiety   . COPD (chronic obstructive pulmonary disease)   . Sleep apnea     neg test  . Restless   . Complication of anesthesia     restless,easily upset  . Headache     Past Surgical History  Procedure Laterality Date  . Abdominal hysterectomy    . Hernia repair    . Eye surgery    . Back surgery    . Joint replacement      bil knees    No Known Allergies  History  Substance Use Topics  . Smoking status: Current Every Day Smoker -- 0.50 packs/day for 20 years    Types: Cigarettes  . Smokeless tobacco: Not on file  . Alcohol Use: No    History reviewed. No pertinent family history. Prior to Admission medications   Medication Sig Start Date End Date Taking? Authorizing Provider  albuterol (PROVENTIL HFA;VENTOLIN HFA) 108 (90 BASE) MCG/ACT inhaler Inhale 2 puffs into the lungs every 6 (six) hours as needed for wheezing.   Yes Historical Provider, MD  ALPRAZolam Prudy Feeler) 1 MG tablet Take 1 mg by mouth 3 (three) times daily as needed for anxiety.   Yes Historical Provider, MD  gabapentin (NEURONTIN) 600 MG tablet Take 600 mg by mouth 4 (four) times daily.   Yes Historical Provider, MD  glyBURIDE-metformin (GLUCOVANCE) 2.5-500 MG per tablet Take 2 tablets by mouth 2 (two) times daily with a meal.   Yes Historical Provider, MD  HYDROcodone-acetaminophen (NORCO) 10-325 MG per tablet Take 1 tablet by mouth every 6 (six) hours as needed for pain.   Yes  Historical Provider, MD  ibuprofen (ADVIL,MOTRIN) 200 MG tablet Take 600 mg by mouth every 6 (six) hours as needed for pain or headache.   Yes Historical Provider, MD  simvastatin (ZOCOR) 20 MG tablet Take 20 mg by mouth every evening.   Yes Historical Provider, MD     Review of Systems  Positive ROS: As above  All other systems have been reviewed and were otherwise negative with the exception of those mentioned in the HPI and as above.  Objective: Vital signs in last 24 hours: Temp:  [97.5 F (36.4 C)] 97.5 F (36.4 C) (02/27 1154) Pulse Rate:  [93] 93 (02/27 1154) Resp:  [20] 20 (02/27 1154) BP: (125)/(88) 125/88 mmHg (02/27 1154) SpO2:  [95 %] 95 % (02/27 1154)  General Appearance: Alert, cooperative, no distress, appears stated age Head: Normocephalic, without obvious abnormality, atraumatic Eyes: PERRL, conjunctiva/corneas clear, EOM's intact, fundi benign, both eyes      Ears: Normal TM's and external ear canals, both ears Throat: Lips, mucosa, and tongue normal; teeth and gums normal Neck: Supple, symmetrical, trachea midline, no adenopathy; thyroid: No enlargement/tenderness/nodules; no carotid bruit or JVD Back: Symmetric, no curvature, ROM normal, no CVA tenderness. The patient's lumbar incision is well-healed. Lungs: Clear to auscultation bilaterally, respirations unlabored Heart: Regular rate and rhythm, S1 and S2 normal, no murmur, rub or gallop Abdomen: Soft, non-tender,  bowel sounds active all four quadrants, no masses, no organomegaly Extremities: Extremities normal, atraumatic, no cyanosis or edema Pulses: 2+ and symmetric all extremities Skin: Skin color, texture, turgor normal, no rashes or lesions  NEUROLOGIC:   Mental status: alert and oriented, no aphasia, good attention span, Fund of knowledge/ memory ok Motor Exam - grossly normal Sensory Exam - grossly normal Reflexes:  Coordination - grossly normal Gait - grossly normal Balance - grossly  normal Cranial Nerves: I: smell Not tested  II: visual acuity  OS: Normal    OD: Normal   II: visual fields Full to confrontation  II: pupils Equal, round, reactive to light  III,VII: ptosis None  III,IV,VI: extraocular muscles  Full ROM  V: mastication Normal  V: facial light touch sensation  Normal  V,VII: corneal reflex  Present  VII: facial muscle function - upper  Normal  VII: facial muscle function - lower Normal  VIII: hearing Not tested  IX: soft palate elevation  Normal  IX,X: gag reflex Present  XI: trapezius strength  5/5  XI: sternocleidomastoid strength 5/5  XI: neck flexion strength  5/5  XII: tongue strength  Normal    Data Review Lab Results  Component Value Date   WBC 16.7* 03/09/2012   HGB 16.8* 03/09/2012   HCT 48.8* 03/09/2012   MCV 85.3 03/09/2012   PLT 342 03/09/2012   Lab Results  Component Value Date   NA 130* 03/09/2012   K 4.1 03/09/2012   CL 91* 03/09/2012   CO2 24 03/09/2012   BUN 9 03/09/2012   CREATININE 0.63 03/09/2012   GLUCOSE 319* 03/09/2012   No results found for this basename: INR, PROTIME    Assessment/Plan: C5-6 herniated disc, cervicalgia, cervical radiculopathy: I discussed the situation with the patient. I have reviewed her imaging studies with her and pointed out the abnormalities. We have discussed the various treatment options including a C5-6 anterior cervical discectomy, fusion, and plating. I described the surgery to her. I've shown her surgical models. I discussed the risks, benefits, alternatives, and likelihood of achieving our goals with surgery. I have answered all the patient's questions. She has decided to proceed with surgery.   Seanpatrick Maisano D 03/11/2012 2:04 PM

## 2012-03-11 NOTE — Anesthesia Preprocedure Evaluation (Addendum)
Anesthesia Evaluation  Patient identified by MRN, date of birth, ID band Patient awake, Patient confused and Patient unresponsive    Reviewed: Allergy & Precautions, H&P , NPO status , Patient's Chart, lab work & pertinent test results  Airway Mallampati: I  Neck ROM: Full    Dental  (+) Edentulous Upper, Dental Advisory Given and Edentulous Lower   Pulmonary sleep apnea , COPDCurrent Smoker,  breath sounds clear to auscultation        Cardiovascular negative cardio ROS  Rhythm:Regular Rate:Normal     Neuro/Psych  Headaches, Anxiety  Neuromuscular disease (pain, weakness, numbness LUE>RUE)    GI/Hepatic negative GI ROS, Neg liver ROS,   Endo/Other  diabetes, Poorly Controlled, Oral Hypoglycemic AgentsMorbid obesity  Renal/GU negative Renal ROS     Musculoskeletal  (+) Arthritis -, Osteoarthritis,    Abdominal (+) + obese,   Peds  Hematology  (+) Blood dyscrasia, , polycythemic   Anesthesia Other Findings   Reproductive/Obstetrics                         Anesthesia Physical Anesthesia Plan  ASA: III  Anesthesia Plan: General   Post-op Pain Management:    Induction: Intravenous  Airway Management Planned: Oral ETT  Additional Equipment:   Intra-op Plan:   Post-operative Plan: Extubation in OR  Informed Consent: I have reviewed the patients History and Physical, chart, labs and discussed the procedure including the risks, benefits and alternatives for the proposed anesthesia with the patient or authorized representative who has indicated his/her understanding and acceptance.   Dental advisory given  Plan Discussed with:   Anesthesia Plan Comments:         Anesthesia Quick Evaluation

## 2012-03-11 NOTE — Anesthesia Procedure Notes (Signed)
Procedure Name: Intubation Date/Time: 03/11/2012 2:40 PM Performed by: Leona Singleton A Pre-anesthesia Checklist: Patient identified Patient Re-evaluated:Patient Re-evaluated prior to inductionOxygen Delivery Method: Circle system utilized Preoxygenation: Pre-oxygenation with 100% oxygen Intubation Type: IV induction Ventilation: Mask ventilation without difficulty Laryngoscope Size: Miller and 2 Grade View: Grade I Tube type: Oral Tube size: 7.0 mm Number of attempts: 1 Airway Equipment and Method: Stylet and LTA kit utilized Placement Confirmation: ETT inserted through vocal cords under direct vision,  positive ETCO2,  CO2 detector and breath sounds checked- equal and bilateral Secured at: 22 cm Tube secured with: Tape Dental Injury: Teeth and Oropharynx as per pre-operative assessment

## 2012-03-11 NOTE — Transfer of Care (Signed)
Immediate Anesthesia Transfer of Care Note  Patient: [name]  Procedure(s) Performed: Procedure(s) with comments: ANTERIOR CERVICAL DECOMPRESSION/DISCECTOMY FUSION 1 LEVEL (N/A) - Cervical five-six anterior cervical decompression with fusion interbody prothesis plating and bonegraft  Patient Location: PACU  Anesthesia Type:General  Level of Consciousness: awake, alert , oriented and patient cooperative  Airway & Oxygen Therapy: Patient Spontanous Breathing and Patient connected to face mask oxygen  Post-op Assessment: Report given to PACU RN, Post -op Vital signs reviewed and stable and Patient moving all extremities X 4  Post vital signs: Reviewed and stable  Complications: No apparent anesthesia complications

## 2012-03-11 NOTE — Plan of Care (Signed)
Problem: Consults Goal: Diagnosis - Spinal Surgery Outcome: Completed/Met Date Met:  03/11/12 Cervical Spine Fusion

## 2012-03-11 NOTE — Op Note (Signed)
Brief history: The patient is a 47 year old white female who has complained of neck and left arm pain. She has failed medical management and was worked up with a cervical MRI. This demonstrated a herniated disc at C5-C6. I discussed situation with the patient. She has weighed the risks, benefits, and alternatives surgery and decided proceed with a C5-6 anterior cervicectomy fusion and plating.  Preoperative diagnosis: C5-C6 herniated disc, cervical adenopathy, cervicalgia  Postoperative diagnosis: The same  Procedure: C5-6 Anterior cervical discectomy/decompression; C5-C6 interbody arthrodesis with local morcellized autograft bone and Actifuse bone graft extender; insertion of interbody prosthesis at C5-6 (Zimmer peek interbody prosthesis); anterior cervical plating from C5-6 with globus titanium plate  Surgeon: Dr. Delma Officer  Asst.: Dr. Orbie Hurst  Anesthesia: Gen. endotracheal  Estimated blood loss: 50 cc  Drains: None  Complications: None  Description of procedure: The patient was brought to the operating room by the anesthesia team. General endotracheal anesthesia was induced. A roll was placed under the patient's shoulders to keep the neck in the neutral position. The patient's anterior cervical region was then prepared with Betadine scrub and Betadine solution. Sterile drapes were applied.  The area to be incised was then injected with Marcaine with epinephrine solution. I then used a scalpel to make a transverse incision in the patient's left anterior neck. I used the Metzenbaum scissors to divide the platysmal muscle and then to dissect medial to the sternocleidomastoid muscle, jugular vein, and carotid artery. I carefully dissected down towards the anterior cervical spine identifying the esophagus and retracting it medially. Then using Kitner swabs to clear soft tissue from the anterior cervical spine. We then inserted a bent spinal needle into the upper exposed intervertebral disc  space. We then obtained intraoperative radiographs confirm our location.  I then used electrocautery to detach the medial border of the longus colli muscle bilaterally from the C5-6 intervertebral disc spaces. I then inserted the Caspar self-retaining retractor underneath the longus colli muscle bilaterally to provide exposure.  We then incised the intervertebral disc at C5-6. We then performed a partial intervertebral discectomy with a pituitary forceps and the Karlin curettes. I then inserted distraction screws into the vertebral bodies at C5 and C6. We then distracted the interspace. We then used the high-speed drill to decorticate the vertebral endplates at C5-6, to drill away the remainder of the intervertebral disc, to drill away some posterior spondylosis, and to thin out the posterior longitudinal ligament. I then incised ligament with the arachnoid knife. We then removed the ligament with a Kerrison punches undercutting the vertebral endplates and decompressing the thecal sac. We then performed foraminotomies about the bilateral C6 nerve roots. This completed the decompression at this level.   We now turned our to attention to the interbody fusion. We used the trial spacers to determine the appropriate size for the interbody prosthesis. We then pre-filled prosthesis with a combination of local morcellized autograft bone that we obtained during decompression as well as Actifuse bone graft extender. We then inserted the prosthesis into the distracted interspace at C5-6. We then removed the distraction screws. There was a good snug fit of the prosthesis in the interspace.   Having completed the fusion we now turned attention to the anterior spinal instrumentation. We used the high-speed drill to drill away some anterior spondylosis at the disc spaces so that the plate lay down flat. We selected the appropriate length titanium anterior cervical plate. We laid it along the anterior aspect of the  vertebral bodies from  C5-C6. We then drilled 12 mm holes at C5 and C6. We then secured the plate to the vertebral bodies by placing two 12 mm self-tapping screws at C5 and C6. We then obtained intraoperative radiograph. The demonstrating good position of the instrumentation. We therefore secured the screws the plate the locking each cam. This completed the instrumentation.  We then obtained hemostasis using bipolar electrocautery. We irrigated the wound out with bacitracin solution. We then removed the retractor. We inspected the esophagus for any damage. There was none apparent. We then reapproximated patient's platysmal muscle with interrupted 3-0 Vicryl suture. We then reapproximated the subcutaneous tissue with interrupted 3-0 Vicryl suture. The skin was reapproximated with Steri-Strips and benzoin. The wound was then covered with bacitracin ointment. A sterile dressing was applied. The drapes were removed. Patient was subsequently extubated by the anesthesia team and transported to the post anesthesia care unit in stable condition. All sponge instrument and needle counts were reportedly correct at the end of this case.

## 2012-03-12 LAB — GLUCOSE, CAPILLARY: Glucose-Capillary: 230 mg/dL — ABNORMAL HIGH (ref 70–99)

## 2012-03-12 MED ORDER — DSS 100 MG PO CAPS: 100.0000 mg | ORAL_CAPSULE | Freq: Two times a day (BID) | ORAL | Status: DC

## 2012-03-12 MED ORDER — OXYCODONE HCL 15 MG PO TABS: 15.0000 mg | ORAL_TABLET | ORAL | Status: DC | PRN

## 2012-03-12 MED ORDER — OXYCODONE HCL 5 MG PO TABS: 15.0000 mg | ORAL_TABLET | ORAL | Status: DC | PRN

## 2012-03-12 MED ORDER — DIAZEPAM 5 MG PO TABS: 5.0000 mg | ORAL_TABLET | Freq: Four times a day (QID) | ORAL | Status: DC | PRN

## 2012-03-12 NOTE — Discharge Summary (Signed)
Physician Discharge Summary  Patient ID: [name] MRN: [id number] DOB/AGE: 1965/10/05 47 y.o.  Admit date: 03/11/2012 Discharge date: 03/12/2012  Admission Diagnoses: C5-6 herniated disc, cervical decomp, cervicalgia  Discharge Diagnoses: The same Active Problems:   * No active hospital problems. *   Discharged Condition: good  Hospital Course: I admitted the patient to [facility] on 03/11/2012. On that day I performed a C5-6 anterior cervical discectomy, fusion, and plating. The surgery went well.  The patient's postoperative course was remarkable only for some difficulty with pain management. The patient has been on pain medications chronically and has a high tolerance for narcotics. We decided to send her home on Roxicodone.  On postop day #1 the patient requested discharge to home. She, and her husband, was given oral and discharge instructions. All her questions were answered.  Consults: None Significant Diagnostic Studies: None Treatments: C5-C6 anterior cervical discectomy, fusion, and plating Discharge Exam: Blood pressure 114/78, pulse 86, temperature 99.1 F (37.3 C), temperature source Oral, resp. rate 18, SpO2 99.00%. The patient is alert and pleasant. She looks well. Her dressing is clean and dry. There is no evidence of hematoma or shift. Her strength is normal. Her arm pain is gone.  Disposition: Home  Discharge Orders   Future Orders Complete By Expires     Call MD for:  difficulty breathing, headache or visual disturbances  As directed     Call MD for:  extreme fatigue  As directed     Call MD for:  hives  As directed     Call MD for:  persistant dizziness or light-headedness  As directed     Call MD for:  persistant nausea and vomiting  As directed     Call MD for:  redness, tenderness, or signs of infection (pain, swelling, redness, odor or green/yellow discharge around incision site)  As directed     Call MD for:  severe uncontrolled pain  As  directed     Call MD for:  temperature >100.4  As directed     Diet - low sodium heart healthy  As directed     Discharge instructions  As directed     Comments:      Call [phone] for a followup appointment. Take a stool softener while you are using pain medications.    Driving Restrictions  As directed     Comments:      Do not drive for 2 weeks.    Increase activity slowly  As directed     Lifting restrictions  As directed     Comments:      Do not lift more than 5 pounds. No excessive bending or twisting.    May shower / Bathe  As directed     Comments:      He may shower after the pain she is removed 3 days after surgery. Leave the incision alone.    Remove dressing in 48 hours  As directed     Comments:      Your stitches are under the scan and will dissolve by themselves. The Steri-Strips will fall off after you take a few showers. Do not rub back or pick at the wound, Leave the wound alone.        Medication List    STOP taking these medications       ALPRAZolam 1 MG tablet  Commonly known as:  XANAX     HYDROcodone-acetaminophen 10-325 MG per tablet  Commonly known  as:  NORCO     ibuprofen 200 MG tablet  Commonly known as:  ADVIL,MOTRIN      TAKE these medications       albuterol 108 (90 BASE) MCG/ACT inhaler  Commonly known as:  PROVENTIL HFA;VENTOLIN HFA  Inhale 2 puffs into the lungs every 6 (six) hours as needed for wheezing.     diazepam 5 MG tablet  Commonly known as:  VALIUM  Take 1 tablet (5 mg total) by mouth every 6 (six) hours as needed.     DSS 100 MG Caps  Take 100 mg by mouth 2 (two) times daily.     gabapentin 600 MG tablet  Commonly known as:  NEURONTIN  Take 600 mg by mouth 4 (four) times daily.     glyBURIDE-metformin 2.5-500 MG per tablet  Commonly known as:  GLUCOVANCE  Take 2 tablets by mouth 2 (two) times daily with a meal.     oxyCODONE 15 MG immediate release tablet  Commonly known as:  ROXICODONE  Take 1 tablet (15 mg  total) by mouth every 4 (four) hours as needed.     simvastatin 20 MG tablet  Commonly known as:  ZOCOR  Take 20 mg by mouth every evening.         SignedCristi Loron 03/12/2012, 7:52 AM

## 2012-03-12 NOTE — Progress Notes (Signed)
UR COMPLETED  

## 2012-03-14 ENCOUNTER — Encounter (HOSPITAL_COMMUNITY): Payer: Self-pay | Admitting: Neurosurgery

## 2012-03-16 ENCOUNTER — Inpatient Hospital Stay (HOSPITAL_COMMUNITY)
Admission: EM | Admit: 2012-03-16 | Discharge: 2012-03-17 | DRG: 392 | Disposition: A | Discharge status: Home / Self Care | Payer: Medicare Other | Source: Ambulatory Visit | Attending: Neurosurgery | Admitting: Neurosurgery

## 2012-03-16 ENCOUNTER — Ambulatory Visit (HOSPITAL_COMMUNITY)
Admission: EM | Admit: 2012-03-16 | Discharge: 2012-03-16 | Disposition: A | Discharge status: Home / Self Care | Payer: Medicare Other | Source: Ambulatory Visit | Attending: Neurosurgery | Admitting: Neurosurgery

## 2012-03-16 ENCOUNTER — Encounter (HOSPITAL_COMMUNITY): Payer: Self-pay | Admitting: Neurosurgery

## 2012-03-16 ENCOUNTER — Encounter (HOSPITAL_COMMUNITY): Payer: Self-pay

## 2012-03-16 ENCOUNTER — Other Ambulatory Visit (HOSPITAL_COMMUNITY): Payer: Self-pay | Admitting: Neurosurgery

## 2012-03-16 ENCOUNTER — Ambulatory Visit (HOSPITAL_COMMUNITY)
Admission: RE | Admit: 2012-03-16 | Discharge: 2012-03-16 | Disposition: A | Discharge status: Home / Self Care | Payer: Medicare Other | Source: Ambulatory Visit | Attending: Neurosurgery | Admitting: Neurosurgery

## 2012-03-16 ENCOUNTER — Other Ambulatory Visit: Payer: Self-pay | Admitting: Neurosurgery

## 2012-03-16 DIAGNOSIS — E119 Type 2 diabetes mellitus without complications: Secondary | ICD-10-CM | POA: Diagnosis present

## 2012-03-16 DIAGNOSIS — F411 Generalized anxiety disorder: Secondary | ICD-10-CM | POA: Diagnosis present

## 2012-03-16 DIAGNOSIS — T148XXA Other injury of unspecified body region, initial encounter: Secondary | ICD-10-CM

## 2012-03-16 DIAGNOSIS — R1310 Dysphagia, unspecified: Principal | ICD-10-CM | POA: Diagnosis present

## 2012-03-16 DIAGNOSIS — J4489 Other specified chronic obstructive pulmonary disease: Secondary | ICD-10-CM | POA: Diagnosis present

## 2012-03-16 DIAGNOSIS — G4730 Sleep apnea, unspecified: Secondary | ICD-10-CM | POA: Diagnosis present

## 2012-03-16 DIAGNOSIS — J449 Chronic obstructive pulmonary disease, unspecified: Secondary | ICD-10-CM | POA: Diagnosis present

## 2012-03-16 DIAGNOSIS — F17200 Nicotine dependence, unspecified, uncomplicated: Secondary | ICD-10-CM | POA: Diagnosis present

## 2012-03-16 NOTE — H&P (Signed)
[name] is an 47 y.o. female.   Chief Complaint: shortness of breath HPI: patient who had a cervical 5-6 fusion 5 days ago by dr Lovell Sheehan. She called me tonite about 945pm complaining of difficulty breathing, neck pain and swelling. By phone i was able to hear that she was hoarse and with changes in her voice. i brought her to the hospital and met her in the waiting area where a ct neck was done right away which showed prevertebral swelling but not hematoma. She is being admitted to the neuro icu  Past Medical History  Diagnosis Date  . Diabetes mellitus without complication   . Anxiety   . COPD (chronic obstructive pulmonary disease)   . Sleep apnea     neg test  . Restless   . Complication of anesthesia     restless,easily upset  . Headache     Past Surgical History  Procedure Laterality Date  . Abdominal hysterectomy    . Hernia repair    . Eye surgery    . Back surgery    . Joint replacement      bil knees  . Anterior cervical decomp/discectomy fusion N/A 03/11/2012    Procedure: ANTERIOR CERVICAL DECOMPRESSION/DISCECTOMY FUSION 1 LEVEL;  Surgeon: Cristi Loron, MD;  Location: [facility];  Service: Neurosurgery;  Laterality: N/A;  Cervical five-six anterior cervical decompression with fusion interbody prothesis plating and bonegraft    No family history on file. Social History:  reports that she has been smoking Cigarettes.  She has a 10 pack-year smoking history. She does not have any smokeless tobacco history on file. She reports that she does not drink alcohol or use illicit drugs.  Allergies: No Known Allergies   (Not in a hospital admission)  No results found for this or any previous visit (from the past 48 hour(s)). No results found.  Review of Systems  Constitutional: Negative.   HENT: Positive for neck pain.   Eyes: Negative.   Respiratory: Positive for cough, shortness of breath and stridor.   Cardiovascular: Negative.   Gastrointestinal:  Negative.   Genitourinary: Negative.   Skin: Negative.   Neurological: Negative.   Endo/Heme/Allergies: Negative.   Psychiatric/Behavioral: Negative.     There were no vitals taken for this visit. Physical Exam hent, nl. Neck some tenderness in operative site. Because of her obesity is difficult to feel a mass. Trachea in midline. Cv, nl. Lungs some rales. Abdomen, nl. Extremities, grade 1 edema. Neuro, normal. Ct neck shows prevertebral edema with open airway  Assessment/Plan To neuro icu for obsevation and treatment. No need for surgery. Spoke with her and son  Karn Cassis 03/16/2012, 11:47 PM

## 2012-03-17 ENCOUNTER — Encounter (HOSPITAL_COMMUNITY): Payer: Self-pay | Admitting: *Deleted

## 2012-03-17 MED ORDER — PHENOL 1.4 % MT LIQD: 1.0000 | OROMUCOSAL | Status: DC | PRN

## 2012-03-17 MED ORDER — DEXAMETHASONE 4 MG PO TABS
4.0000 mg | ORAL_TABLET | Freq: Four times a day (QID) | ORAL | Status: DC
  Administered 2012-03-17 (×2): 4 mg via ORAL
  Filled 2012-03-17 (×6): qty 1

## 2012-03-17 MED ORDER — ACETAMINOPHEN 325 MG PO TABS: 650.0000 mg | ORAL_TABLET | ORAL | Status: DC | PRN

## 2012-03-17 MED ORDER — DEXAMETHASONE SODIUM PHOSPHATE 4 MG/ML IJ SOLN
4.0000 mg | Freq: Four times a day (QID) | INTRAMUSCULAR | Status: DC
  Filled 2012-03-17 (×4): qty 1

## 2012-03-17 MED ORDER — OXYCODONE HCL 5 MG PO TABS
15.0000 mg | ORAL_TABLET | ORAL | Status: DC | PRN
  Administered 2012-03-17 (×2): 15 mg via ORAL
  Filled 2012-03-17 (×2): qty 3

## 2012-03-17 MED ORDER — SODIUM CHLORIDE 0.9 % IJ SOLN
3.0000 mL | Freq: Two times a day (BID) | INTRAMUSCULAR | Status: DC
  Administered 2012-03-17: 3 mL via INTRAVENOUS

## 2012-03-17 MED ORDER — SODIUM CHLORIDE 0.9 % IV SOLN
INTRAVENOUS | Status: DC
  Administered 2012-03-17: 02:00:00 via INTRAVENOUS

## 2012-03-17 MED ORDER — MENTHOL 3 MG MT LOZG: 1.0000 | LOZENGE | OROMUCOSAL | Status: DC | PRN

## 2012-03-17 MED ORDER — SODIUM CHLORIDE 0.9 % IV SOLN: 250.0000 mL | INTRAVENOUS | Status: DC

## 2012-03-17 MED ORDER — INSULIN ASPART 100 UNIT/ML ~~LOC~~ SOLN
0.0000 [IU] | Freq: Three times a day (TID) | SUBCUTANEOUS | Status: DC
  Administered 2012-03-17: 20 [IU] via SUBCUTANEOUS

## 2012-03-17 MED ORDER — SODIUM CHLORIDE 0.9 % IJ SOLN: 3.0000 mL | INTRAMUSCULAR | Status: DC | PRN

## 2012-03-17 MED ORDER — ACETAMINOPHEN 650 MG RE SUPP: 650.0000 mg | RECTAL | Status: DC | PRN

## 2012-03-17 MED ORDER — ONDANSETRON HCL 4 MG/2ML IJ SOLN: 4.0000 mg | INTRAMUSCULAR | Status: DC | PRN

## 2012-03-17 MED ORDER — MORPHINE SULFATE 2 MG/ML IJ SOLN
2.0000 mg | INTRAMUSCULAR | Status: DC | PRN
  Administered 2012-03-17: 2 mg via INTRAVENOUS
  Filled 2012-03-17: qty 1

## 2012-03-17 NOTE — Discharge Summary (Signed)
Physician Discharge Summary  Patient ID: [name] MRN: [id number] DOB/AGE: 09/05/1965 47 y.o.  Admit date: 03/16/2012 Discharge date: 03/17/2012  Admission Diagnoses: Dysphagia  Discharge Diagnoses: The same Active Problems:   * No active hospital problems. *   Discharged Condition: good  Hospital Course: Dr. Jeral Fruit admitted the patient to [facility] on 03/16/2012 with a diagnosis of dysphagia after A. anterior cervical discectomy fusion and plating. She was worked up with a neck CT which demonstrated some diffuse swelling but no evidence of infection or hematoma, etc.  The patient was observed in the ICU. On 03/17/2012 the patient looked and felt better. She requested discharge to home. She said her swallowing was much better. She was given oral and written discharge instructions. All her questions were answered.  Consults: None Significant Diagnostic Studies: Cervical CT Treatments: Observation Discharge Exam: Blood pressure 117/82, pulse 89, temperature 97.3 F (36.3 C), temperature source Oral, resp. rate 20, height 5\' 6"  (1.676 m), weight 128.7 kg (283 lb 11.7 oz), SpO2 94.00%. The patient is alert and pleasant. She looks well. Her wound is healing well without signs of infection. There is no evidence of hematoma or midline shift. Her strength is normal in all 4 extremities.  Disposition: Home  Discharge Orders   Future Orders Complete By Expires     Call MD for:  difficulty breathing, headache or visual disturbances  As directed     Call MD for:  extreme fatigue  As directed     Call MD for:  hives  As directed     Call MD for:  persistant dizziness or light-headedness  As directed     Call MD for:  persistant nausea and vomiting  As directed     Call MD for:  redness, tenderness, or signs of infection (pain, swelling, redness, odor or green/yellow discharge around incision site)  As directed     Call MD for:  severe uncontrolled pain  As directed     Call MD  for:  temperature >100.4  As directed     Diet - low sodium heart healthy  As directed     Discharge instructions  As directed     Comments:      Call [phone] for a followup appointment. Take a stool softener while you are using pain medications.    Driving Restrictions  As directed     Comments:      Do not drive for 2 weeks.    Increase activity slowly  As directed     Lifting restrictions  As directed     Comments:      Do not lift more than 5 pounds. No excessive bending or twisting.    May shower / Bathe  As directed     Comments:      He may shower after the pain she is removed 3 days after surgery. Leave the incision alone.    No dressing needed  As directed         Medication List    TAKE these medications       albuterol 108 (90 BASE) MCG/ACT inhaler  Commonly known as:  PROVENTIL HFA;VENTOLIN HFA  Inhale 2 puffs into the lungs every 6 (six) hours as needed for wheezing.     diazepam 5 MG tablet  Commonly known as:  VALIUM  Take 1 tablet (5 mg total) by mouth every 6 (six) hours as needed.     DSS 100 MG Caps  Take  100 mg by mouth 2 (two) times daily.     gabapentin 600 MG tablet  Commonly known as:  NEURONTIN  Take 600 mg by mouth 4 (four) times daily.     glyBURIDE-metformin 2.5-500 MG per tablet  Commonly known as:  GLUCOVANCE  Take 2 tablets by mouth 2 (two) times daily with a meal.     oxyCODONE 15 MG immediate release tablet  Commonly known as:  ROXICODONE  Take 1 tablet (15 mg total) by mouth every 4 (four) hours as needed.     simvastatin 20 MG tablet  Commonly known as:  ZOCOR  Take 20 mg by mouth every evening.         SignedCristi Loron 03/17/2012, 9:44 AM

## 2012-07-26 ENCOUNTER — Encounter: Payer: Self-pay | Admitting: Neurosurgery

## 2012-08-13 ENCOUNTER — Encounter: Payer: Self-pay | Admitting: Neurosurgery

## 2012-08-28 ENCOUNTER — Emergency Department: Payer: Self-pay | Admitting: Emergency Medicine

## 2012-08-28 LAB — BASIC METABOLIC PANEL
Calcium, Total: 9 mg/dL (ref 8.5–10.1)
Chloride: 98 mmol/L (ref 98–107)
Co2: 27 mmol/L (ref 21–32)
Creatinine: 0.77 mg/dL (ref 0.60–1.30)
EGFR (African American): >60
EGFR (Non-African Amer.): >60
Potassium: 3.9 mmol/L (ref 3.5–5.1)
Sodium: 130 mmol/L — ABNORMAL LOW (ref 136–145)

## 2012-08-28 LAB — CBC
HCT: 45.8 % (ref 35.0–47.0)
HGB: 15.9 g/dL (ref 12.0–16.0)
MCH: 29.6 pg (ref 26.0–34.0)
RBC: 5.37 10*6/uL — ABNORMAL HIGH (ref 3.80–5.20)
RDW: 14 % (ref 11.5–14.5)

## 2012-08-28 LAB — HEPATIC FUNCTION PANEL A (ARMC)
Albumin: 3.2 g/dL — ABNORMAL LOW (ref 3.4–5.0)
Bilirubin, Direct: <0.1 mg/dL (ref 0.00–0.20)
Total Protein: 7.8 g/dL (ref 6.4–8.2)

## 2012-08-28 LAB — PROTIME-INR: INR: 0.9

## 2012-08-28 LAB — LIPASE, BLOOD: Lipase: 140 U/L (ref 73–393)

## 2012-08-28 LAB — TROPONIN I: Troponin-I: <0.02 ng/mL

## 2012-08-28 LAB — CK TOTAL AND CKMB (NOT AT ARMC): CK, Total: 33 U/L (ref 21–215)

## 2012-09-22 ENCOUNTER — Other Ambulatory Visit: Payer: Self-pay | Admitting: Neurosurgery

## 2012-09-22 DIAGNOSIS — M542 Cervicalgia: Secondary | ICD-10-CM

## 2012-09-30 ENCOUNTER — Ambulatory Visit
Admission: RE | Admit: 2012-09-30 | Discharge: 2012-09-30 | Disposition: A | Discharge status: Home / Self Care | Payer: Medicare Other | Source: Ambulatory Visit | Attending: Neurosurgery | Admitting: Neurosurgery

## 2012-09-30 DIAGNOSIS — M542 Cervicalgia: Secondary | ICD-10-CM

## 2012-10-06 ENCOUNTER — Inpatient Hospital Stay: Payer: Self-pay | Admitting: Internal Medicine

## 2012-10-06 LAB — URINALYSIS, COMPLETE
Leukocyte Esterase: NEGATIVE
Nitrite: NEGATIVE
Protein: NEGATIVE
RBC,UR: 5 /HPF (ref 0–5)
Specific Gravity: 1.029 (ref 1.003–1.030)
Squamous Epithelial: 4
WBC UR: <1 /HPF (ref 0–5)

## 2012-10-06 LAB — COMPREHENSIVE METABOLIC PANEL
Anion Gap: 7 (ref 7–16)
BUN: 15 mg/dL (ref 7–18)
Bilirubin,Total: 0.4 mg/dL (ref 0.2–1.0)
Calcium, Total: 9.1 mg/dL (ref 8.5–10.1)
Chloride: 102 mmol/L (ref 98–107)
Creatinine: 0.7 mg/dL (ref 0.60–1.30)
EGFR (African American): >60
EGFR (Non-African Amer.): >60
Glucose: 181 mg/dL — ABNORMAL HIGH (ref 65–99)
SGOT(AST): 21 U/L (ref 15–37)
Sodium: 133 mmol/L — ABNORMAL LOW (ref 136–145)
Total Protein: 7.8 g/dL (ref 6.4–8.2)

## 2012-10-06 LAB — CBC
HGB: 16 g/dL (ref 12.0–16.0)
Platelet: 365 10*3/uL (ref 150–440)
RBC: 5.35 10*6/uL — ABNORMAL HIGH (ref 3.80–5.20)
RDW: 16.2 % — ABNORMAL HIGH (ref 11.5–14.5)

## 2012-10-06 LAB — TROPONIN I: Troponin-I: <0.02 ng/mL

## 2012-10-07 LAB — LIPID PANEL
HDL Cholesterol: 25 mg/dL — ABNORMAL LOW (ref 40–60)
Triglycerides: 273 mg/dL — ABNORMAL HIGH (ref 0–200)
VLDL Cholesterol, Calc: 55 mg/dL — ABNORMAL HIGH (ref 5–40)

## 2012-10-11 ENCOUNTER — Encounter (HOSPITAL_COMMUNITY): Payer: Self-pay | Admitting: Adult Health

## 2012-10-11 ENCOUNTER — Emergency Department (HOSPITAL_COMMUNITY): Payer: Medicare Other

## 2012-10-11 ENCOUNTER — Inpatient Hospital Stay (HOSPITAL_COMMUNITY)
Admission: EM | Admit: 2012-10-11 | Discharge: 2012-10-13 | DRG: 065 | Disposition: A | Discharge status: Home / Self Care | Payer: Medicare Other | Attending: Internal Medicine | Admitting: Internal Medicine

## 2012-10-11 DIAGNOSIS — E669 Obesity, unspecified: Secondary | ICD-10-CM | POA: Diagnosis present

## 2012-10-11 DIAGNOSIS — G4730 Sleep apnea, unspecified: Secondary | ICD-10-CM | POA: Diagnosis present

## 2012-10-11 DIAGNOSIS — F411 Generalized anxiety disorder: Secondary | ICD-10-CM | POA: Diagnosis present

## 2012-10-11 DIAGNOSIS — I6340 Cerebral infarction due to embolism of unspecified cerebral artery: Principal | ICD-10-CM | POA: Diagnosis present

## 2012-10-11 DIAGNOSIS — F1910 Other psychoactive substance abuse, uncomplicated: Secondary | ICD-10-CM | POA: Diagnosis present

## 2012-10-11 DIAGNOSIS — Z23 Encounter for immunization: Secondary | ICD-10-CM

## 2012-10-11 DIAGNOSIS — F17200 Nicotine dependence, unspecified, uncomplicated: Secondary | ICD-10-CM | POA: Diagnosis present

## 2012-10-11 DIAGNOSIS — J449 Chronic obstructive pulmonary disease, unspecified: Secondary | ICD-10-CM | POA: Diagnosis present

## 2012-10-11 DIAGNOSIS — Z96659 Presence of unspecified artificial knee joint: Secondary | ICD-10-CM

## 2012-10-11 DIAGNOSIS — I639 Cerebral infarction, unspecified: Secondary | ICD-10-CM

## 2012-10-11 DIAGNOSIS — E119 Type 2 diabetes mellitus without complications: Secondary | ICD-10-CM | POA: Diagnosis present

## 2012-10-11 DIAGNOSIS — G8929 Other chronic pain: Secondary | ICD-10-CM | POA: Diagnosis present

## 2012-10-11 DIAGNOSIS — M1990 Unspecified osteoarthritis, unspecified site: Secondary | ICD-10-CM | POA: Diagnosis present

## 2012-10-11 DIAGNOSIS — Z6841 Body Mass Index (BMI) 40.0 and over, adult: Secondary | ICD-10-CM

## 2012-10-11 DIAGNOSIS — G8190 Hemiplegia, unspecified affecting unspecified side: Secondary | ICD-10-CM | POA: Diagnosis present

## 2012-10-11 DIAGNOSIS — G4734 Idiopathic sleep related nonobstructive alveolar hypoventilation: Secondary | ICD-10-CM | POA: Diagnosis present

## 2012-10-11 DIAGNOSIS — Z720 Tobacco use: Secondary | ICD-10-CM | POA: Diagnosis present

## 2012-10-11 DIAGNOSIS — J4489 Other specified chronic obstructive pulmonary disease: Secondary | ICD-10-CM | POA: Diagnosis present

## 2012-10-11 DIAGNOSIS — Z794 Long term (current) use of insulin: Secondary | ICD-10-CM

## 2012-10-11 DIAGNOSIS — M549 Dorsalgia, unspecified: Secondary | ICD-10-CM | POA: Diagnosis present

## 2012-10-11 DIAGNOSIS — Z8673 Personal history of transient ischemic attack (TIA), and cerebral infarction without residual deficits: Secondary | ICD-10-CM

## 2012-10-11 DIAGNOSIS — G459 Transient cerebral ischemic attack, unspecified: Secondary | ICD-10-CM | POA: Diagnosis present

## 2012-10-11 DIAGNOSIS — E785 Hyperlipidemia, unspecified: Secondary | ICD-10-CM | POA: Diagnosis present

## 2012-10-11 DIAGNOSIS — Z79899 Other long term (current) drug therapy: Secondary | ICD-10-CM

## 2012-10-11 DIAGNOSIS — Z981 Arthrodesis status: Secondary | ICD-10-CM

## 2012-10-11 HISTORY — DX: Malignant (primary) neoplasm, unspecified: C80.1

## 2012-10-11 HISTORY — DX: Cerebral infarction, unspecified: I63.9

## 2012-10-11 LAB — CBC
HCT: 45.8 % (ref 36.0–46.0)
MCH: 29.1 pg (ref 26.0–34.0)
MCV: 85.9 fL (ref 78.0–100.0)
RBC: 5.33 MIL/uL — ABNORMAL HIGH (ref 3.87–5.11)
WBC: 15.7 10*3/uL — ABNORMAL HIGH (ref 4.0–10.5)

## 2012-10-11 LAB — DIFFERENTIAL
Eosinophils Absolute: 0.2 10*3/uL (ref 0.0–0.7)
Eosinophils Relative: 1 % (ref 0–5)
Lymphocytes Relative: 32 % (ref 12–46)
Lymphs Abs: 5 10*3/uL — ABNORMAL HIGH (ref 0.7–4.0)
Monocytes Absolute: 0.9 10*3/uL (ref 0.1–1.0)
Monocytes Relative: 5 % (ref 3–12)

## 2012-10-11 LAB — COMPREHENSIVE METABOLIC PANEL
ALT: 15 U/L (ref 0–35)
BUN: 15 mg/dL (ref 6–23)
CO2: 21 mEq/L (ref 19–32)
Calcium: 9.1 mg/dL (ref 8.4–10.5)
Creatinine, Ser: 0.62 mg/dL (ref 0.50–1.10)
GFR calc Af Amer: >90 mL/min (ref >90)
GFR calc non Af Amer: >90 mL/min (ref >90)
Glucose, Bld: 220 mg/dL — ABNORMAL HIGH (ref 70–99)
Sodium: 134 mEq/L — ABNORMAL LOW (ref 135–145)

## 2012-10-11 LAB — TROPONIN I: Troponin I: <0.3 ng/mL (ref <0.30)

## 2012-10-11 LAB — POCT I-STAT TROPONIN I

## 2012-10-11 MED ORDER — ENOXAPARIN SODIUM 40 MG/0.4ML ~~LOC~~ SOLN
40.0000 mg | SUBCUTANEOUS | Status: DC
  Administered 2012-10-11 – 2012-10-12 (×2): 40 mg via SUBCUTANEOUS
  Filled 2012-10-11 (×3): qty 0.4

## 2012-10-11 MED ORDER — PIOGLITAZONE HCL 45 MG PO TABS
45.0000 mg | ORAL_TABLET | Freq: Every day | ORAL | Status: DC
  Administered 2012-10-12 – 2012-10-13 (×2): 45 mg via ORAL
  Filled 2012-10-11 (×3): qty 1

## 2012-10-11 MED ORDER — FLUCONAZOLE 100 MG PO TABS
100.0000 mg | ORAL_TABLET | Freq: Every day | ORAL | Status: AC
  Administered 2012-10-12: 100 mg via ORAL
  Filled 2012-10-11: qty 1

## 2012-10-11 MED ORDER — GABAPENTIN 600 MG PO TABS
600.0000 mg | ORAL_TABLET | Freq: Four times a day (QID) | ORAL | Status: DC
  Administered 2012-10-11 – 2012-10-13 (×8): 600 mg via ORAL
  Filled 2012-10-11 (×10): qty 1

## 2012-10-11 MED ORDER — INSULIN ASPART 100 UNIT/ML ~~LOC~~ SOLN
0.0000 [IU] | Freq: Every day | SUBCUTANEOUS | Status: DC
  Administered 2012-10-11: 3 [IU] via SUBCUTANEOUS

## 2012-10-11 MED ORDER — ASPIRIN 325 MG PO TABS
325.0000 mg | ORAL_TABLET | Freq: Every day | ORAL | Status: DC
  Administered 2012-10-12 – 2012-10-13 (×2): 325 mg via ORAL
  Filled 2012-10-11 (×3): qty 1

## 2012-10-11 MED ORDER — PANTOPRAZOLE SODIUM 40 MG PO TBEC
40.0000 mg | DELAYED_RELEASE_TABLET | Freq: Every day | ORAL | Status: DC
  Administered 2012-10-12 – 2012-10-13 (×2): 40 mg via ORAL
  Filled 2012-10-11 (×2): qty 1

## 2012-10-11 MED ORDER — SITAGLIPTIN PHOS-METFORMIN HCL 50-1000 MG PO TABS: 1.0000 | ORAL_TABLET | Freq: Every day | ORAL | Status: DC

## 2012-10-11 MED ORDER — LINAGLIPTIN 5 MG PO TABS
5.0000 mg | ORAL_TABLET | Freq: Every day | ORAL | Status: DC
  Administered 2012-10-12 – 2012-10-13 (×2): 5 mg via ORAL
  Filled 2012-10-11 (×3): qty 1

## 2012-10-11 MED ORDER — SIMVASTATIN 20 MG PO TABS
20.0000 mg | ORAL_TABLET | Freq: Every evening | ORAL | Status: DC
  Administered 2012-10-11: 20 mg via ORAL
  Filled 2012-10-11 (×2): qty 1

## 2012-10-11 MED ORDER — METFORMIN HCL 500 MG PO TABS
1000.0000 mg | ORAL_TABLET | Freq: Every day | ORAL | Status: DC
  Administered 2012-10-13: 1000 mg via ORAL
  Filled 2012-10-11 (×3): qty 2

## 2012-10-11 MED ORDER — ALBUTEROL SULFATE HFA 108 (90 BASE) MCG/ACT IN AERS: 2.0000 | INHALATION_SPRAY | Freq: Four times a day (QID) | RESPIRATORY_TRACT | Status: DC | PRN

## 2012-10-11 MED ORDER — DAPAGLIFLOZIN PROPANEDIOL 10 MG PO TABS
10.0000 mg | ORAL_TABLET | Freq: Every day | ORAL | Status: DC
  Administered 2012-10-12 – 2012-10-13 (×2): 10 mg via ORAL
  Filled 2012-10-11 (×2): qty 1

## 2012-10-11 MED ORDER — HYDROMORPHONE HCL PF 1 MG/ML IJ SOLN
1.0000 mg | Freq: Once | INTRAMUSCULAR | Status: AC
  Administered 2012-10-11: 1 mg via INTRAVENOUS
  Filled 2012-10-11: qty 1

## 2012-10-11 MED ORDER — HYDROCODONE-ACETAMINOPHEN 10-325 MG PO TABS
1.0000 | ORAL_TABLET | Freq: Four times a day (QID) | ORAL | Status: DC
  Administered 2012-10-11 – 2012-10-12 (×3): 1 via ORAL
  Filled 2012-10-11 (×3): qty 1

## 2012-10-11 MED ORDER — INSULIN ASPART 100 UNIT/ML ~~LOC~~ SOLN
0.0000 [IU] | Freq: Three times a day (TID) | SUBCUTANEOUS | Status: DC
  Administered 2012-10-12: 3 [IU] via SUBCUTANEOUS
  Administered 2012-10-12: 5 [IU] via SUBCUTANEOUS
  Administered 2012-10-12: 3 [IU] via SUBCUTANEOUS
  Administered 2012-10-13: 5 [IU] via SUBCUTANEOUS
  Administered 2012-10-13: 8 [IU] via SUBCUTANEOUS

## 2012-10-11 MED ORDER — FLUCONAZOLE 100 MG PO TABS: 100.0000 mg | ORAL_TABLET | Freq: Every day | ORAL | Status: DC

## 2012-10-11 MED ORDER — INSULIN DETEMIR 100 UNIT/ML ~~LOC~~ SOLN
60.0000 [IU] | Freq: Every day | SUBCUTANEOUS | Status: DC
  Administered 2012-10-12 – 2012-10-13 (×2): 60 [IU] via SUBCUTANEOUS
  Filled 2012-10-11 (×2): qty 0.6

## 2012-10-11 MED ORDER — ALPRAZOLAM 0.5 MG PO TABS
1.0000 mg | ORAL_TABLET | Freq: Two times a day (BID) | ORAL | Status: DC
  Administered 2012-10-11 – 2012-10-13 (×4): 1 mg via ORAL
  Filled 2012-10-11 (×4): qty 2

## 2012-10-11 NOTE — ED Provider Notes (Signed)
CSN: [id number]     Arrival date & time 10/11/12  1505 History   First MD Initiated Contact with Patient 10/11/12 1528     Chief Complaint  Patient presents with  . Stroke Symptoms   (Consider location/radiation/quality/duration/timing/severity/associated sxs/prior Treatment) Patient is a 47 y.o. female presenting with neurologic complaint.  Neurologic Problem This is a recurrent problem. The current episode started today. The problem occurs constantly. The problem has been waxing and waning. Associated symptoms include numbness and weakness. Pertinent negatives include no abdominal pain, chest pain, chills, congestion, coughing, fever, nausea, rash, sore throat or vomiting. Nothing aggravates the symptoms. She has tried nothing for the symptoms. The treatment provided no relief.    Past Medical History  Diagnosis Date  . Diabetes mellitus without complication   . Anxiety   . COPD (chronic obstructive pulmonary disease)   . Sleep apnea     neg test  . Restless   . Complication of anesthesia     restless,easily upset  . Headache(784.0)   . Stroke   . Cancer     a spot on liver and treated    Past Surgical History  Procedure Laterality Date  . Abdominal hysterectomy    . Hernia repair    . Eye surgery    . Back surgery    . Joint replacement      bil knees  . Anterior cervical decomp/discectomy fusion N/A 03/11/2012    Procedure: ANTERIOR CERVICAL DECOMPRESSION/DISCECTOMY FUSION 1 LEVEL;  Surgeon: Cristi Loron, MD;  Location: [facility];  Service: Neurosurgery;  Laterality: N/A;  Cervical five-six anterior cervical decompression with fusion interbody prothesis plating and bonegraft   Family History  Problem Relation Age of Onset  . Diabetes Mother   . Hypertension Mother   . Hypertension Father    History  Substance Use Topics  . Smoking status: Current Every Day Smoker -- 0.50 packs/day for 20 years    Types: Cigarettes  . Smokeless tobacco: Not on file  .  Alcohol Use: No   OB History   Grav Para Term Preterm Abortions TAB SAB Ect Mult Living                 Review of Systems  Constitutional: Negative for fever and chills.  HENT: Negative for congestion, sore throat and rhinorrhea.   Eyes: Negative for photophobia and visual disturbance.  Respiratory: Negative for cough and shortness of breath.   Cardiovascular: Negative for chest pain and leg swelling.  Gastrointestinal: Negative for nausea, vomiting, abdominal pain, diarrhea and constipation.  Endocrine: Negative for polyphagia and polyuria.  Genitourinary: Negative for dysuria, flank pain, vaginal bleeding, vaginal discharge and enuresis.  Musculoskeletal: Negative for back pain and gait problem.  Skin: Negative for color change and rash.  Neurological: Positive for weakness and numbness. Negative for dizziness, syncope and light-headedness.  Hematological: Negative for adenopathy. Does not bruise/bleed easily.  All other systems reviewed and are negative.    Allergies  Tramadol  Home Medications   No current outpatient prescriptions on file. BP 97/63  Pulse 83  Temp(Src) 98.3 F (36.8 C) (Oral)  Resp 17  Ht 5\' 6"  (1.676 m)  Wt 250 lb (113.399 kg)  BMI 40.37 kg/m2  SpO2 100% Physical Exam  Vitals reviewed. Constitutional: She is oriented to person, place, and time. She appears well-developed and well-nourished.  HENT:  Head: Normocephalic and atraumatic.  Right Ear: External ear normal.  Left Ear: External ear normal.  Eyes: Conjunctivae and EOM  are normal. Pupils are equal, round, and reactive to light.  Neck: Normal range of motion. Neck supple.  Cardiovascular: Normal rate, regular rhythm, normal heart sounds and intact distal pulses.   Pulmonary/Chest: Effort normal and breath sounds normal.  Abdominal: Soft. Bowel sounds are normal. There is no tenderness.  Musculoskeletal: Normal range of motion.  Neurological: She is alert and oriented to person, place, and  time. A sensory deficit is present. No cranial nerve deficit. She exhibits abnormal muscle tone. GCS eye subscore is 4. GCS verbal subscore is 5. GCS motor subscore is 6.  Reflex Scores:      Patellar reflexes are 0 on the right side and 0 on the left side.      Achilles reflexes are 1+ on the right side and 1+ on the left side. Motor 3/5 on right upper and lower extremity, pt claims sensory loss throughout R face with some weakness, pupils PERL  Skin: Skin is warm and dry.    ED Course  Procedures (including critical care time) Labs Review Labs Reviewed  CBC - Abnormal; Notable for the following:    WBC 15.7 (*)    RBC 5.33 (*)    Hemoglobin 15.5 (*)    All other components within normal limits  DIFFERENTIAL - Abnormal; Notable for the following:    Neutro Abs 9.6 (*)    Lymphs Abs 5.0 (*)    All other components within normal limits  COMPREHENSIVE METABOLIC PANEL - Abnormal; Notable for the following:    Sodium 134 (*)    Glucose, Bld 220 (*)    Albumin 3.3 (*)    Total Bilirubin 0.1 (*)    All other components within normal limits  GLUCOSE, CAPILLARY - Abnormal; Notable for the following:    Glucose-Capillary 264 (*)    All other components within normal limits  PROTIME-INR  APTT  TROPONIN I  HEMOGLOBIN A1C  LIPID PANEL  URINE RAPID DRUG SCREEN (HOSP PERFORMED)  CBC  CREATININE, SERUM  POCT I-STAT TROPONIN I   Imaging Review Ct Head (brain) Wo Contrast  10/11/2012   *RADIOLOGY REPORT*  Clinical Data: code stroke:  patient had stroke seen at [facility] on wednesday with right side affected, complaining of right side tingling today  CT HEAD WITHOUT CONTRAST  Technique:  Contiguous axial images were obtained from the base of the skull through the vertex without contrast.  Comparison: None.  Findings: There is a 12 x 14 mm ovoid focus of low attenuation in the deep periventricular white matter on the left, adjacent to the body of the left lateral ventricle.  This abnormality  extends from the level of the posterior limb of the internal capsule cranially to nearly the upper edge of the ventricles.  There are no other areas of abnormal attenuation to suggest hemorrhage or infarct elsewhere.  There is no hydrocephalus.  There is mild diffuse atrophy.  The calvarium is intact.  IMPRESSION: Consistent with history, there is evidence of a subacute lacunar infarct in the left deep periventricular white matter.  Critical Value/emergent results were called by telephone at the time of interpretation on 10/11/2012 at 3:42 p.m. to Dr. Thad Ranger, who verbally acknowledged these results.   Original Report Authenticated By: Esperanza Heir, M.D.    MDM   1. CVA (cerebral infarction)   2. Brain TIA   3. Diabetes   4. Hyperlipidemia    47 y.o. female  with pertinent PMH of capsular CVA 5 days ago presents with increased R facial  numbness and weakness.  She has residual R sided weakness and sensory loss which are unchanged.  Code stroke called on arrival, no acute intervention necessitating, and medical admission recommended for rest of stroke wu, which was apparently not performed at the outside facility.  Exam as above without acute findings.  Consulted medicine and pt admitted. .    Labs and imaging as above reviewed by myself and attending,Dr. Ranae Palms, with whom case was discussed.   1. CVA (cerebral infarction)   2. Brain TIA   3. Diabetes   4. Hyperlipidemia         Noel Gerold, MD 10/11/12 [phone]

## 2012-10-11 NOTE — ED Notes (Signed)
MD at bedside. 

## 2012-10-11 NOTE — ED Notes (Signed)
Report called to Joyce Gross, RN unit 4N.

## 2012-10-11 NOTE — Consult Note (Signed)
Referring Physician: Ranae Palms    Chief Complaint: Stroke  HPI:                                                                                                                                         [name] is an 47 y.o. female who was recently seen on 10-06-12 in [facility] for right arm and leg weakness. Patient had confirmed left IC CVA.  While in [place] patient did have Carotid dopplers and blood tests but was discharged prior to having a Echocardiogram of her heart. Patient had residual right arm and leg weakness due to previous stroke, however at 11 AM today she noted decreased sensation of her right face which prompted patients husband to bring her to ED.  Currently she continues to have decreased sensation of her right face but no worsening of her right arm or leg weakness. tPA was not given due to recent CVA.  Patient was not on ASA prior to hospitalization in [place] and states she has been taking ASA daily since discharge.   In [place]  --LDL 127  Date last known well: Date: 10/11/2012 Time last known well: Time: 11:00 tPA Given: No: recent stroke on 10-06-12  Past Medical History  Diagnosis Date  . Diabetes mellitus without complication   . Anxiety   . COPD (chronic obstructive pulmonary disease)   . Sleep apnea     neg test  . Restless   . Complication of anesthesia     restless,easily upset  . RUEAVWUJ(811.9)     Past Surgical History  Procedure Laterality Date  . Abdominal hysterectomy    . Hernia repair    . Eye surgery    . Back surgery    . Joint replacement      bil knees  . Anterior cervical decomp/discectomy fusion N/A 03/11/2012    Procedure: ANTERIOR CERVICAL DECOMPRESSION/DISCECTOMY FUSION 1 LEVEL;  Surgeon: Cristi Loron, MD;  Location: [facility];  Service: Neurosurgery;  Laterality: N/A;  Cervical five-six anterior cervical decompression with fusion interbody prothesis plating and bonegraft    Family History  Problem Relation  Age of Onset  . Diabetes Mother   . Hypertension Mother   . Hypertension Father    Social History:  reports that she has been smoking Cigarettes.  She has a 10 pack-year smoking history. She does not have any smokeless tobacco history on file. She reports that she does not drink alcohol or use illicit drugs.  Allergies:  Allergies  Allergen Reactions  . Morphine And Related   . Tramadol     Medications:  No current facility-administered medications for this encounter.   Current Outpatient Prescriptions  Medication Sig Dispense Refill  . albuterol (PROVENTIL HFA;VENTOLIN HFA) 108 (90 BASE) MCG/ACT inhaler Inhale 2 puffs into the lungs every 6 (six) hours as needed for wheezing.      Marland Kitchen ALPRAZolam (XANAX) 1 MG tablet Take 1 mg by mouth 3 (three) times daily as needed for sleep or anxiety.      . diazepam (VALIUM) 5 MG tablet Take 1 tablet (5 mg total) by mouth every 6 (six) hours as needed.  50 tablet  1  . docusate sodium 100 MG CAPS Take 100 mg by mouth 2 (two) times daily.  60 capsule  1  . gabapentin (NEURONTIN) 600 MG tablet Take 600 mg by mouth 4 (four) times daily.      Marland Kitchen glyBURIDE-metformin (GLUCOVANCE) 2.5-500 MG per tablet Take 2 tablets by mouth 2 (two) times daily with a meal.      . oxyCODONE (ROXICODONE) 15 MG immediate release tablet Take 1 tablet (15 mg total) by mouth every 4 (four) hours as needed.  100 tablet  0  . simvastatin (ZOCOR) 20 MG tablet Take 20 mg by mouth every evening.         ROS:                                                                                                                                       History obtained from the patient  General ROS: negative for - chills, fatigue, fever, night sweats, weight gain or weight loss Psychological ROS: negative for - behavioral disorder, hallucinations, memory difficulties, mood  swings or suicidal ideation Ophthalmic ROS: negative for - blurry vision, double vision, eye pain or loss of vision ENT ROS: negative for - epistaxis, nasal discharge, oral lesions, sore throat, tinnitus or vertigo Allergy and Immunology ROS: negative for - hives or itchy/watery eyes Hematological and Lymphatic ROS: negative for - bleeding problems, bruising or swollen lymph nodes Endocrine ROS: negative for - galactorrhea, hair pattern changes, polydipsia/polyuria or temperature intolerance Respiratory ROS: negative for - cough, hemoptysis, shortness of breath or wheezing Cardiovascular ROS: negative for - chest pain, dyspnea on exertion, edema or irregular heartbeat Gastrointestinal ROS: negative for - abdominal pain, diarrhea, hematemesis, nausea/vomiting or stool incontinence Genito-Urinary ROS: negative for - dysuria, hematuria, incontinence or urinary frequency/urgency Musculoskeletal ROS: negative for - joint swelling or muscular weakness Neurological ROS: as noted in HPI Dermatological ROS: negative for rash and skin lesion changes  Neurologic Examination:  Blood pressure 116/76, pulse 108, temperature 98.4 F (36.9 C), temperature source Oral, resp. rate 21, weight 123.832 kg (273 lb), SpO2 96.00%.  Mental Status: Alert, oriented, thought content appropriate.  Speech fluent without evidence of aphasia.  Able to follow 3 step commands without difficulty. Cranial Nerves: II: Discs flat bilaterally; Visual fields grossly normal, pupils equal, round, reactive to light and accommodation III,IV, VI: ptosis not present, extra-ocular motions intact bilaterally V,VII: smile symmetric, facial light touch sensation decreased on the right VIII: hearing normal bilaterally IX,X: gag reflex present XI: bilateral shoulder shrug XII: midline tongue extension Motor: Right : Upper extremity    3/5    Left:     Upper extremity   5/5  Lower extremity   3/5     Lower extremity   5/5 --able to hold arm antigravity but no strength against resistance --difficulty taking part in heel to shin but able to bend leg at knee, not able keep leg of bed without falling to bed.  Tone and bulk:normal tone throughout; no atrophy noted Sensory: Pinprick and light touch intact throughout, bilaterally Deep Tendon Reflexes:  Right: Upper Extremity   Left: Upper extremity   biceps (C-5 to C-6) 2/4   biceps (C-5 to C-6) 2/4 tricep (C7) 2/4    triceps (C7) 2/4 Brachioradialis (C6) 2/4  Brachioradialis (C6) 2/4  Lower Extremity Lower Extremity  quadriceps (L-2 to L-4) 1/4   quadriceps (L-2 to L-4) 1/4 Achilles (S1) 0/4   Achilles (S1) 0/4  Plantars: Mute bilaterally Cerebellar: normal finger-to-nose on the left with dysmetria on the right secondary to strength,  normal heel-to-shin on the left but unable to perform on the right due to strength Gait: not assessed CV: pulses palpable throughout    No results found for this or any previous visit (from the past 48 hour(s)). Ct Head (brain) Wo Contrast  10/11/2012   *RADIOLOGY REPORT*  Clinical Data: code stroke:  patient had stroke seen at [facility] on wednesday with right side affected, complaining of right side tingling today  CT HEAD WITHOUT CONTRAST  Technique:  Contiguous axial images were obtained from the base of the skull through the vertex without contrast.  Comparison: None.  Findings: There is a 12 x 14 mm ovoid focus of low attenuation in the deep periventricular white matter on the left, adjacent to the body of the left lateral ventricle.  This abnormality extends from the level of the posterior limb of the internal capsule cranially to nearly the upper edge of the ventricles.  There are no other areas of abnormal attenuation to suggest hemorrhage or infarct elsewhere.  There is no hydrocephalus.  There is mild diffuse atrophy.  The calvarium is  intact.  IMPRESSION: Consistent with history, there is evidence of a subacute lacunar infarct in the left deep periventricular white matter.  Critical Value/emergent results were called by telephone at the time of interpretation on 10/11/2012 at 3:42 p.m. to Dr. Thad Ranger, who verbally acknowledged these results.   Original Report Authenticated By: Esperanza Heir, M.D.    Felicie Morn PA-C [facility] [phone]  10/11/2012, 4:02 PM   Patient seen and examined.  Clinical course and management discussed.  Necessary edits performed.  I agree with the above.  Assessment and plan of care developed and discussed below.  Assessment: 47 y.o. female with recent infarct, discharged from [facility] on 9/26 without full work up, who presents with right sided weakness. Not a tPA candidate due to recent infarct.  Patient at  high risk for a recurrent event.  Taking ASA at home, just recently started.  CT reviewed and shows evidence of her recent infarct but no acute changes.    Stroke Risk Factors - diabetes mellitus, hyperlipidemia, hypertension and smoking  Plan: 1.  MRI of the brain without contrast 2.  Echocardiogram 3.  Recent work up shows evidence of elevated lipids (patient on Zocor) and elevated blood sugar (recently started on insulin).   4.  Will need to follow up results of carotid doppler 5.  Smoking cessation couseling 6.  Continue ASA  Thana Farr, MD [facility] [phone]  10/11/2012  4:48 PM

## 2012-10-11 NOTE — H&P (Signed)
[facility] History and Physical  [name]:[id number] DOB: 10-Jan-1966    PCP:  None  Chief Complaint: Right facial numbness.  HPI: [name] is an 47 y.o. female with hx of tobacco abuse, hyperlipidemia, DM, sleep apnea, obesity, presents to the ER at [facility] complaining of right facial numbness today.  Of note, she was diagnosed with internal capsule CVA about 5 days ago and was admitted to [facility], where she reportedly had carotid US, but no cardiac ECHO, and discharged on ASA along with a statin.  She has been compliant with these medications. She has residual right sided weakness and this hadn't changed, but she experienced facial numbness with no slurred speech or worsening weakness.  She didn't have any HA.  Code stroke was called, but she wasn't a TPA candidate because of recent CVA.  She was seen in consultation with Dr Jodi Mourning of neurology and hospitalist was asked to admit for completing the stroke work up.  Rewiew of Systems:  Constitutional: Negative for malaise, fever and chills. No significant weight loss or weight gain Eyes: Negative for eye pain, redness and discharge, diplopia, visual changes, or flashes of light. ENMT: Negative for ear pain, hoarseness, nasal congestion, sinus pressure and sore throat. No headaches; tinnitus, drooling, or problem swallowing. Cardiovascular: Negative for chest pain, palpitations, diaphoresis, dyspnea and peripheral edema. ; No orthopnea, PND Respiratory: Negative for cough, hemoptysis, wheezing and stridor. No pleuritic chestpain. Gastrointestinal: Negative for nausea, vomiting, diarrhea, constipation, abdominal pain, melena, blood in stool, hematemesis, jaundice and rectal bleeding.    Genitourinary: Negative for frequency, dysuria, incontinence,flank pain and hematuria; Musculoskeletal: Negative for back pain and neck pain. Negative for swelling and trauma.;  Skin: . Negative for pruritus, rash, abrasions,  bruising and skin lesion.; ulcerations Neuro: Negative for headache, lightheadedness and neck stiffness. Negative for weakness, altered level of consciousness , altered mental status, burning feet, involuntary movement, seizure and syncope.  Psych: negative for depression, insomnia, tearfulness, panic attacks, hallucinations, paranoia, suicidal or homicidal ideation    Past Medical History  Diagnosis Date  . Diabetes mellitus without complication   . Anxiety   . COPD (chronic obstructive pulmonary disease)   . Sleep apnea     neg test  . Restless   . Complication of anesthesia     restless,easily upset  . NFAOZHYQ(657.8)     Past Surgical History  Procedure Laterality Date  . Abdominal hysterectomy    . Hernia repair    . Eye surgery    . Back surgery    . Joint replacement      bil knees  . Anterior cervical decomp/discectomy fusion N/A 03/11/2012    Procedure: ANTERIOR CERVICAL DECOMPRESSION/DISCECTOMY FUSION 1 LEVEL;  Surgeon: Cristi Loron, MD;  Location: [facility];  Service: Neurosurgery;  Laterality: N/A;  Cervical five-six anterior cervical decompression with fusion interbody prothesis plating and bonegraft    Medications:  HOME MEDS: Prior to Admission medications   Medication Sig Start Date End Date Taking? Authorizing Provider  albuterol (PROVENTIL HFA;VENTOLIN HFA) 108 (90 BASE) MCG/ACT inhaler Inhale 2 puffs into the lungs every 6 (six) hours as needed for wheezing.   Yes Historical Provider, MD  ALPRAZolam Prudy Feeler) 1 MG tablet Take 1 mg by mouth 2 (two) times daily.    Yes Historical Provider, MD  FARXIGA 10 MG TABS Take 10 mg by mouth daily. 09/29/12  Yes Historical Provider, MD  fluconazole (DIFLUCAN) 100 MG tablet Take 100 mg by mouth daily.  For 10 days, start 9.20.14 END 9.30.14 09/07/12  Yes Historical Provider, MD  gabapentin (NEURONTIN) 600 MG tablet Take 600 mg by mouth 4 (four) times daily.   Yes Historical Provider, MD  HYDROcodone-acetaminophen  (NORCO) 10-325 MG per tablet Take 1 tablet by mouth 4 (four) times daily.   Yes Historical Provider, MD  insulin detemir (LEVEMIR) 100 UNIT/ML injection Inject 60 Units into the skin daily.   Yes Historical Provider, MD  lisinopril (PRINIVIL,ZESTRIL) 5 MG tablet Take 5 mg by mouth daily.   Yes Historical Provider, MD  pantoprazole (PROTONIX) 40 MG tablet Take 40 mg by mouth daily. 09/29/12  Yes Historical Provider, MD  pioglitazone (ACTOS) 45 MG tablet Take 45 mg by mouth daily.   Yes Historical Provider, MD  simvastatin (ZOCOR) 20 MG tablet Take 20 mg by mouth every evening.   Yes Historical Provider, MD  sitaGLIPtin-metformin (JANUMET) 50-1000 MG per tablet Take 1 tablet by mouth daily.   Yes Historical Provider, MD     Allergies:  Allergies  Allergen Reactions  . Tramadol Nausea And Vomiting and Other (See Comments)    Increase blood pressure    Social History:   reports that she has been smoking Cigarettes.  She has a 10 pack-year smoking history. She does not have any smokeless tobacco history on file. She reports that she does not drink alcohol or use illicit drugs.  Family History: Family History  Problem Relation Age of Onset  . Diabetes Mother   . Hypertension Mother   . Hypertension Father      Physical Exam: Filed Vitals:   10/11/12 1930 10/11/12 1937 10/11/12 1945 10/11/12 2109  BP: 90/59  106/66 97/63  Pulse: 83  85 83  Temp:  97.8 F (36.6 C)  98.3 F (36.8 C)  TempSrc:    Oral  Resp: 12  16 17   Height:    5\' 6"  (1.676 m)  Weight:    113.399 kg (250 lb)  SpO2: 96%  96% 100%   Blood pressure 97/63, pulse 83, temperature 98.3 F (36.8 C), temperature source Oral, resp. rate 17, height 5\' 6"  (1.676 m), weight 113.399 kg (250 lb), SpO2 100.00%.  GEN:  Pleasant patient lying in the stretcher in no acute distress; cooperative with exam. PSYCH:  alert and oriented x4; does not appear anxious or depressed; affect is appropriate. HEENT: Mucous membranes pink and  anicteric; PERRLA; EOM intact; no cervical lymphadenopathy nor thyromegaly or carotid bruit; no JVD; There were no stridor. Neck is very supple. She has right facial droop. Breasts:: Not examined CHEST WALL: No tenderness CHEST: Normal respiration, clear to auscultation bilaterally.  HEART: Regular rate and rhythm.  There are no murmur, rub, or gallops.   BACK: No kyphosis or scoliosis; no CVA tenderness ABDOMEN: soft and non-tender; no masses, no organomegaly, normal abdominal bowel sounds; no pannus; no intertriginous candida. There is no rebound and no distention. Rectal Exam: Not done EXTREMITIES: No bone or joint deformity; age-appropriate arthropathy of the hands and knees; no edema; no ulcerations.  There is no calf tenderness. Genitalia: not examined PULSES: 2+ and symmetric SKIN: Normal hydration no rash or ulceration CNS: Cranial nerves 2-12 grossly intact no focal lateralizing neurologic deficit.  Speech is fluent; uvula elevated with phonation, facial asymmetry  and tongue midline. DTR are normal bilaterally, cerebella exam is intact, barbinski is negative and right side is significantly weaker than left.  Decreased in right facial area.   Labs on Admission:  Basic Metabolic Panel:  Recent Labs Lab 10/11/12 1542  NA 134*  K 3.9  CL 97  CO2 21  GLUCOSE 220*  BUN 15  CREATININE 0.62  CALCIUM 9.1   Liver Function Tests:  Recent Labs Lab 10/11/12 1542  AST 12  ALT 15  ALKPHOS 71  BILITOT 0.1*  PROT 7.6  ALBUMIN 3.3*   No results found for this basename: LIPASE, AMYLASE,  in the last 168 hours No results found for this basename: AMMONIA,  in the last 168 hours CBC:  Recent Labs Lab 10/11/12 1542  WBC 15.7*  NEUTROABS 9.6*  HGB 15.5*  HCT 45.8  MCV 85.9  PLT 279   Cardiac Enzymes:  Recent Labs Lab 10/11/12 1542  TROPONINI <0.30    CBG: No results found for this basename: GLUCAP,  in the last 168 hours   Radiological Exams on Admission: Ct Head  (brain) Wo Contrast  10/11/2012   *RADIOLOGY REPORT*  Clinical Data: code stroke:  patient had stroke seen at [facility] on wednesday with right side affected, complaining of right side tingling today  CT HEAD WITHOUT CONTRAST  Technique:  Contiguous axial images were obtained from the base of the skull through the vertex without contrast.  Comparison: None.  Findings: There is a 12 x 14 mm ovoid focus of low attenuation in the deep periventricular white matter on the left, adjacent to the body of the left lateral ventricle.  This abnormality extends from the level of the posterior limb of the internal capsule cranially to nearly the upper edge of the ventricles.  There are no other areas of abnormal attenuation to suggest hemorrhage or infarct elsewhere.  There is no hydrocephalus.  There is mild diffuse atrophy.  The calvarium is intact.  IMPRESSION: Consistent with history, there is evidence of a subacute lacunar infarct in the left deep periventricular white matter.  Critical Value/emergent results were called by telephone at the time of interpretation on 10/11/2012 at 3:42 p.m. to Dr. Thad Ranger, who verbally acknowledged these results.   Original Report Authenticated By: Esperanza Heir, M.D.    Assessment/Plan Present on Admission:  . Brain TIA . Hyperlipidemia . Sleep apnea . Diabetes . Tobacco abuse . COPD (chronic obstructive pulmonary disease)  PLAN:  Will admit her for completing the CVA work up.  As per recommendation of Dr Jodi Mourning, will obtain MRI and ECHO.  She had carotid US and please obtain result.  If she had another stroke, would consider changing ASA to Plavix.  She should also continue her statin, and I have recommended cig cessation.  For her DM, will continue her meds, and use SSI.  Will offer CPAP for her sleep apnea.  She is stable, full code, and will be admitted to [facility] service.  Thank you for asking me to participate in her care.  Other plans as per orders.  Code Status: FULL  Unk Lightning, MD. [facility] Pager [phone] 7pm to 7am.  10/11/2012, 10:14 PM

## 2012-10-11 NOTE — ED Notes (Signed)
Presents with right sided facial numbness and right sided facial droop. Reports, "I was admitted to [facility] on Weds and Dx with a stroke, I have right sided arm paralysis since then. The facial numbness has been off and on since weds but not as bed as it got today at 11am and now it is not going away" alert, oriented, answers all questions appropriately c/o headache. BP 116/76.

## 2012-10-11 NOTE — ED Notes (Signed)
Attempted to call report to unit 4N.

## 2012-10-11 NOTE — Code Documentation (Signed)
47 yo female arriving to the hospital via private vehicle at 1505.  Code Stroke called at 1527 for new onset right facial numbness.  Per patient, she was seen at [facility] last Wednesday for stroke, patient has paperwork at bedside.  Patient was discharged before her work up was completed with right sided weakness.  At 1100 this morning she had new onset of right facial numbness.  CT completed and shows "consistent with history, there is evidence of a subacute lacunar infarct in the left deep periventricular white matter".  Dr. Thad Ranger at bedside and aware of results.  NIHSS 4 on arrival for right sided weakness and decreased sensation on the right.  Patient is contraindicated for tPA administration due to recent stroke.  No acute stroke intervention at this time.  Patient and family updated on plan of care at bedside.  Handoff with ED RN Tresa Endo at bedside.

## 2012-10-12 ENCOUNTER — Observation Stay (HOSPITAL_COMMUNITY): Payer: Medicare Other

## 2012-10-12 ENCOUNTER — Encounter (HOSPITAL_COMMUNITY): Payer: Self-pay

## 2012-10-12 DIAGNOSIS — I6350 Cerebral infarction due to unspecified occlusion or stenosis of unspecified cerebral artery: Secondary | ICD-10-CM

## 2012-10-12 DIAGNOSIS — I517 Cardiomegaly: Secondary | ICD-10-CM

## 2012-10-12 DIAGNOSIS — E785 Hyperlipidemia, unspecified: Secondary | ICD-10-CM

## 2012-10-12 DIAGNOSIS — J449 Chronic obstructive pulmonary disease, unspecified: Secondary | ICD-10-CM

## 2012-10-12 DIAGNOSIS — E119 Type 2 diabetes mellitus without complications: Secondary | ICD-10-CM

## 2012-10-12 LAB — RAPID URINE DRUG SCREEN, HOSP PERFORMED
Barbiturates: NOT DETECTED
Benzodiazepines: NOT DETECTED
Cocaine: NOT DETECTED
Opiates: POSITIVE — AB

## 2012-10-12 LAB — LIPID PANEL
Cholesterol: 228 mg/dL — ABNORMAL HIGH (ref 0–200)
Total CHOL/HDL Ratio: 10.4 RATIO
VLDL: 75 mg/dL — ABNORMAL HIGH (ref 0–40)

## 2012-10-12 LAB — GLUCOSE, CAPILLARY
Glucose-Capillary: 183 mg/dL — ABNORMAL HIGH (ref 70–99)
Glucose-Capillary: 146 mg/dL — ABNORMAL HIGH (ref 70–99)

## 2012-10-12 LAB — HEMOGLOBIN A1C
Hgb A1c MFr Bld: 10.5 % — ABNORMAL HIGH (ref <5.7)
Mean Plasma Glucose: 255 mg/dL — ABNORMAL HIGH (ref <117)

## 2012-10-12 MED ORDER — LORAZEPAM 2 MG/ML IJ SOLN
1.0000 mg | Freq: Once | INTRAMUSCULAR | Status: AC
  Administered 2012-10-12: 1 mg via INTRAVENOUS
  Filled 2012-10-12: qty 1

## 2012-10-12 MED ORDER — OXYCODONE HCL 5 MG PO TABS
5.0000 mg | ORAL_TABLET | Freq: Four times a day (QID) | ORAL | Status: DC
  Administered 2012-10-12 (×2): 5 mg via ORAL
  Filled 2012-10-12 (×2): qty 1

## 2012-10-12 MED ORDER — NICOTINE 21 MG/24HR TD PT24
21.0000 mg | MEDICATED_PATCH | Freq: Every day | TRANSDERMAL | Status: DC
  Administered 2012-10-12 – 2012-10-13 (×2): 21 mg via TRANSDERMAL
  Filled 2012-10-12 (×2): qty 1

## 2012-10-12 MED ORDER — OXYCODONE HCL 5 MG PO TABS
5.0000 mg | ORAL_TABLET | Freq: Once | ORAL | Status: AC
  Administered 2012-10-12: 5 mg via ORAL
  Filled 2012-10-12: qty 1

## 2012-10-12 MED ORDER — LORAZEPAM BOLUS VIA INFUSION: 1.0000 mg | INTRAVENOUS | Status: DC | PRN

## 2012-10-12 MED ORDER — OXYCODONE-ACETAMINOPHEN 5-325 MG PO TABS
1.0000 | ORAL_TABLET | Freq: Four times a day (QID) | ORAL | Status: DC
  Administered 2012-10-12 (×2): 1 via ORAL
  Filled 2012-10-12 (×2): qty 1

## 2012-10-12 MED ORDER — LORAZEPAM 2 MG/ML IJ SOLN
1.0000 mg | INTRAMUSCULAR | Status: DC | PRN
  Administered 2012-10-12: 1 mg via INTRAVENOUS

## 2012-10-12 MED ORDER — OXYCODONE HCL 5 MG PO TABS: 5.0000 mg | ORAL_TABLET | Freq: Once | ORAL | Status: AC | PRN

## 2012-10-12 MED ORDER — PNEUMOCOCCAL VAC POLYVALENT 25 MCG/0.5ML IJ INJ
0.5000 mL | INJECTION | INTRAMUSCULAR | Status: AC
  Administered 2012-10-13: 0.5 mL via INTRAMUSCULAR
  Filled 2012-10-12: qty 0.5

## 2012-10-12 MED ORDER — SIMVASTATIN 40 MG PO TABS
40.0000 mg | ORAL_TABLET | Freq: Every evening | ORAL | Status: DC
  Administered 2012-10-12 – 2012-10-13 (×2): 40 mg via ORAL
  Filled 2012-10-12 (×2): qty 1

## 2012-10-12 NOTE — Progress Notes (Signed)
Chaplain responded to page to be with family and pt.  Upon arrival, pt was visibly emotional due to the chance that she might have to go to rehab.  Chaplain spoke with family and pt regarding her concerns with rehab and her condition.  CH also provided a meal ticket for pt's husband as it had been sometime since he had eaten.  CH administered emotional support, hospitality, grief support, and ministry of presence.  Visit was ended in prayer.    10/12/12 1130  Clinical Encounter Type  Visited With Patient and family together  Visit Type Initial;Spiritual support  Referral From Chaplain  Consult/Referral To Chaplain  Spiritual Encounters  Spiritual Needs Emotional;Grief support;Prayer  Stress Factors  Patient Stress Factors Major life changes;Financial concerns  Family Stress Factors Financial concerns  [facility] Sheral Apley  [phone]

## 2012-10-12 NOTE — Progress Notes (Signed)
*  PRELIMINARY RESULTS* Echocardiogram 2D Echocardiogram has been performed.  [name] 10/12/2012, 3:41 PM

## 2012-10-12 NOTE — Progress Notes (Signed)
Stroke Team Progress Note  HISTORY [name] is an 47 y.o. female who was recently seen on 10-06-12 in [place] [facility] for right arm and leg weakness. Patient had confirmed left IC CVA. While in [place] patient did have Carotid dopplers and blood tests but was discharged prior to having a Echocardiogram of her heart. Patient had residual right arm and leg weakness due to previous stroke, however at 11 AM today she noted decreased sensation of her right face which prompted patients husband to bring her to ED. Currently she continues to have decreased sensation of her right face but no worsening of her right arm or leg weakness. tPA was not given due to recent CVA. Patient was not on ASA prior to hospitalization in [place] and states she has been taking ASA daily since discharge. LDL at [facility] noted to be 127.   She was admitted for further evaluation and treatment.  SUBJECTIVE Her family is at the bedside.  Overall she feels her condition is gradually improving. She is tearful and does not want to thing about participating in any type of rehabilitation venue other than outpatient as was set up last week during her initial stroke.  OBJECTIVE Most recent Vital Signs: Filed Vitals:   10/12/12 0430 10/12/12 0700 10/12/12 0844 10/12/12 1335  BP: 105/66 106/79 115/66 101/62  Pulse: 87 98 82 75  Temp: 97.7 F (36.5 C) 97.9 F (36.6 C) 97.7 F (36.5 C) 98.1 F (36.7 C)  TempSrc: Oral Oral Oral Oral  Resp: 18 22 20 20   Height:      Weight:      SpO2: 96% 97% 97% 97%   CBG (last 3)   Recent Labs  10/11/12 2255 10/12/12 0651 10/12/12 1155  GLUCAP 264* 183* 242*    IV Fluid Intake:     MEDICATIONS  . ALPRAZolam  1 mg Oral BID  . aspirin  325 mg Oral Daily  . Dapagliflozin Propanediol  10 mg Oral Daily  . enoxaparin (LOVENOX) injection  40 mg Subcutaneous Q24H  . gabapentin  600 mg Oral QID  . HYDROcodone-acetaminophen  1 tablet Oral QID  . insulin aspart  0-15 Units Subcutaneous  TID WC  . insulin aspart  0-5 Units Subcutaneous QHS  . insulin detemir  60 Units Subcutaneous Daily  . linagliptin  5 mg Oral Q breakfast   And  . metFORMIN  1,000 mg Oral Q breakfast  . nicotine  21 mg Transdermal Daily  . pantoprazole  40 mg Oral Daily  . pioglitazone  45 mg Oral Q breakfast  . [START ON 10/13/2012] pneumococcal 23 valent vaccine  0.5 mL Intramuscular Tomorrow-1000  . simvastatin  40 mg Oral QPM   PRN:  albuterol, LORazepam  Diet:  Carb Control thin liquids Activity:  Bathroom privileges with assistance DVT Prophylaxis:  lovenox  CLINICALLY SIGNIFICANT STUDIES Basic Metabolic Panel:  Recent Labs Lab 10/11/12 1542  NA 134*  K 3.9  CL 97  CO2 21  GLUCOSE 220*  BUN 15  CREATININE 0.62  CALCIUM 9.1   Liver Function Tests:  Recent Labs Lab 10/11/12 1542  AST 12  ALT 15  ALKPHOS 71  BILITOT 0.1*  PROT 7.6  ALBUMIN 3.3*   CBC:  Recent Labs Lab 10/11/12 1542  WBC 15.7*  NEUTROABS 9.6*  HGB 15.5*  HCT 45.8  MCV 85.9  PLT 279   Coagulation:  Recent Labs Lab 10/11/12 1542  LABPROT 12.7  INR 0.97   Cardiac Enzymes:  Recent Labs  Lab 10/11/12 1542  TROPONINI <0.30   Urinalysis: No results found for this basename: COLORURINE, APPERANCEUR, LABSPEC, PHURINE, GLUCOSEU, HGBUR, BILIRUBINUR, KETONESUR, PROTEINUR, UROBILINOGEN, NITRITE, LEUKOCYTESUR,  in the last 168 hours Lipid Panel    Component Value Date/Time   CHOL 228* 10/12/2012 0845   TRIG 374* 10/12/2012 0845   HDL 22* 10/12/2012 0845   CHOLHDL 10.4 10/12/2012 0845   VLDL 75* 10/12/2012 0845   LDLCALC 131* 10/12/2012 0845   HgbA1C  No results found for this basename: HGBA1C    Urine Drug Screen:     Component Value Date/Time   LABOPIA POSITIVE* 10/12/2012 0731   COCAINSCRNUR NONE DETECTED 10/12/2012 0731   LABBENZ NONE DETECTED 10/12/2012 0731   AMPHETMU NONE DETECTED 10/12/2012 0731   THCU POSITIVE* 10/12/2012 0731   LABBARB NONE DETECTED 10/12/2012 0731    Alcohol Level: No results  found for this basename: ETH,  in the last 168 hours  Ct Head (brain) Wo Contrast 10/11/2012  Consistent with history, there is evidence of a subacute lacunar infarct in the left deep periventricular white matter.    MRI of the brain    MRA of the brain    2D Echocardiogram    Carotid Doppler    EKG  normal sinus rhythm.   Therapy Recommendations   Physical Exam   Alert, oriented, thought content appropriate. Speech fluent without evidence of aphasia. Able to follow 3 step commands without difficulty.  Cranial Nerves:  II: Discs flat bilaterally; Visual fields grossly normal, pupils equal, round, reactive to light and accommodation  III,IV, VI: ptosis not present, extra-ocular motions intact bilaterally  V,VII: smile symmetric, facial light touch sensation decreased on the right  VIII: hearing normal bilaterally  IX,X: gag reflex present  XI: bilateral shoulder shrug  XII: midline tongue extension  Motor:  Right : Upper extremity 3/5 Left: Upper extremity 5/5  Lower extremity 3/5 Lower extremity 4/5  --able to hold arm antigravity but no strength against resistance  --difficulty taking part in heel to shin but able to bend leg at knee, not able keep leg of bed without falling to bed.  Tone and bulk:normal tone throughout; no atrophy noted  Sensory: Pinprick and light touch intact throughout, bilaterally  Deep Tendon Reflexes:  Right: Upper Extremity Left: Upper extremity  biceps (C-5 to C-6) 2/4 biceps (C-5 to C-6) 2/4  tricep (C7) 2/4 triceps (C7) 2/4  Brachioradialis (C6) 2/4 Brachioradialis (C6) 2/4  Lower Extremity Lower Extremity  quadriceps (L-2 to L-4) 1/4 quadriceps (L-2 to L-4) 1/4  Achilles (S1) 0/4 Achilles (S1) 0/4  Plantars:  Mute bilaterally  Cerebellar:  normal finger-to-nose on the left with dysmetria on the right secondary to strength, normal heel-to-shin on the left but unable to perform on the right due to strength  Gait: not assessed  CV: pulses  palpable throughout  ASSESSMENT [name] is a 47 y.o. female presenting with decreased facial sensation. In addition, she has continued to have right sided hemiparesis from stroke last week (was at [facility]). Imaging confirms a subacute lacunar infarct in the left deep periventricular white matter. Infarct felt to be embolic secondary to unknown source.  On aspirin 325 mg orally every day prior to admission. Now on aspirin 325 mg orally every day for secondary stroke prevention. Patient with resultant right sided hemiparesis, right face sensory loss. Work up underway.   Diabetes mellitus, A1C pending.  Sleep apnea  Hyperlipidemia, LDL 131, goal < 70 in diabetics  Polysubstance abuse  Hospital day # 1   TREATMENT/PLAN  Continue aspirin 325 mg orally every day for secondary stroke prevention.  Statin therapy  Polysubstance abuse counseling  Chaplain counseling  Will ask for social work to see as well  MR/echo pending. Carotid doppler results pending.  Gwendolyn Lima. Manson Passey, [facility], MBA, MHA [facility] Pager: [phone] 10/12/2012 2:50 PM  I have personally obtained a history, examined the patient, evaluated imaging results, and formulated the assessment and plan of care. I agree with the above. Delia Heady, MD

## 2012-10-12 NOTE — Progress Notes (Signed)
Pt stated at 1630 that pt not happy about pain management and will like to see MD . MD notified.  New orders received for pain and noted out pharmacy also notified. Pt was medicated and verbalized some relief of pain rated 5/10at 1852  . Needed assist given no acute change in status .  MRI still pending as pt was not cooperative during imaging post 2 mg ativan given Iv

## 2012-10-12 NOTE — Evaluation (Signed)
Physical Therapy Evaluation Patient Details Name: [name] MRN: [id number] DOB: 1965/08/10 Today's Date: 10/12/2012 Time: 4098-1191 PT Time Calculation (min): 29 min  PT Assessment / Plan / Recommendation History of Present Illness  47 y.o. female admitted to [facility] on 10/11/12 with hx of tobacco abuse, hyperlipidemia, DM, sleep apnea, obesity, presents to the ER at [facility] complaining of right facial numbness today.  Of note, she was diagnosed with internal capsule CVA about 5 days ago and was admitted to [facility], where she reportedly had carotid US, but no cardiac ECHO, and discharged on ASA along with a statin.  She has been compliant with these medications. She has residual right sided weakness and this hadn't changed, but she experienced facial numbness with no slurred speech or worsening weakness.  She didn't have any HA.  Code stroke was called, but she wasn't a TPA candidate because of recent CVA.    Clinical Impression  The pt is moving suprisingly well despite right upper and lower extremity weakness.  She is very strong on her left arm and leg which is very helpful.  I believe she is ready for pre-gait and gait activities with 2 person assist and either hemi walker or R PFRW.  PT will follow acutely and recommend she resume HHPT and have HHOT ordered for d/c (she was active with PT, but not OT and I am not sure why-she is appropriate for HHOT as well).      PT Assessment  Patient needs continued PT services    Follow Up Recommendations  Home health PT;Supervision for mobility/OOB;Other (comment) (HHOT as well)    Does the patient have the potential to tolerate intense rehabilitation     Yes  Barriers to Discharge Other (comment) (None) None    Equipment Recommendations  None recommended by PT    Recommendations for Other Services   None  Frequency Min 4X/week    Precautions / Restrictions Precautions Precautions: Fall Precaution Comments: right hemiperesis    Pertinent Vitals/Pain See vitals flow sheet.      Mobility  Bed Mobility Bed Mobility: Supine to Sit;Sitting - Scoot to [place] of Bed;Sit to Supine Supine to Sit: 4: Min assist;With rails;HOB elevated Sitting - Scoot to [place] of Bed: 4: Min assist;With rail Sit to Supine: 4: Min assist;With rail;HOB flat Details for Bed Mobility Assistance: min assist to help progress right leg into and out of bed and to help pt protect her right arm during mobility.  She is able to pull her trunk upright with her left arm and the use of the railing.   Transfers Transfers: Sit to Stand;Stand to [employer] Transfers Sit to Stand: 3: Mod assist;With upper extremity assist;With armrests;From bed;From chair/3-in-1 Stand to Sit: 3: Mod assist;With upper extremity assist;With armrests;To bed;To chair/3-in-1 Stand Pivot Transfers: 3: Mod assist;From elevated surface;With armrests Details for Transfer Assistance: mod assist to steady pt for balance and provide her leverage to pull from with her left arm.  Pt was able once stabilized in standing to take a fewl pivotal steps around to both the right and the left sides to get to recliner, BSC, and back into the bed.  Verbal cues provided for her and her husband (ideas) to help make the transfers at home easier.   Ambulation/Gait Ambulation/Gait Assistance: Not tested (comment) (I believe she could with the right AD and +2) Modified Rankin (Stroke Patients Only) Pre-Morbid Rankin Score: Severe disability Modified Rankin: Severe disability  PT Diagnosis: Difficulty walking;Abnormality of gait;Generalized weakness;Acute pain;Hemiplegia dominant side  PT Problem List: Decreased strength;Decreased activity tolerance;Decreased balance;Decreased mobility;Decreased knowledge of use of DME;Impaired sensation;Obesity;Pain PT Treatment Interventions: DME instruction;Gait training;Functional mobility training;Therapeutic activities;Therapeutic exercise;Balance  training;Neuromuscular re-education;Patient/family education;Wheelchair mobility training;Modalities     PT Goals(Current goals can be found in the care plan section) Acute Rehab PT Goals Patient Stated Goal: to get stronger, more independent, start walking again.   PT Goal Formulation: With patient Time For Goal Achievement: 10/26/12 Potential to Achieve Goals: Good  Visit Information  Last PT Received On: 10/12/12 Assistance Needed: +2 (for gait, transfers +1) History of Present Illness: 47 y.o. female admitted to [facility] on 10/11/12 with hx of tobacco abuse, hyperlipidemia, DM, sleep apnea, obesity, presents to the ER at [facility] complaining of right facial numbness today.  Of note, she was diagnosed with internal capsule CVA about 5 days ago and was admitted to [facility], where she reportedly had carotid US, but no cardiac ECHO, and discharged on ASA along with a statin.  She has been compliant with these medications. She has residual right sided weakness and this hadn't changed, but she experienced facial numbness with no slurred speech or worsening weakness.  She didn't have any HA.  Code stroke was called, but she wasn't a TPA candidate because of recent CVA.         Prior Functioning  Home Living Family/patient expects to be discharged to:: Private residence Living Arrangements: Spouse/significant other Available Help at Discharge: Family;Available 24 hours/day Type of Home: House Home Equipment: Walker - 2 wheels;Cane - quad;Bedside commode;Shower seat;Wheelchair - manual Additional Comments: husband was assisting with all transfers.  She was active with HHPT, but not OT.   Prior Function Level of Independence: Needs assistance Gait / Transfers Assistance Needed: min assist to transfer into and out of WC, BSC at home ADL's / Homemaking Assistance Needed: max to total assist.   Communication / Swallowing Assistance Needed: none needed.   Communication Communication: No  difficulties    Cognition  Cognition Arousal/Alertness: Awake/alert Behavior During Therapy: WFL for tasks assessed/performed Overall Cognitive Status: Within Functional Limits for tasks assessed    Extremity/Trunk Assessment Upper Extremity Assessment Upper Extremity Assessment: Defer to OT evaluation Lower Extremity Assessment Lower Extremity Assessment: RLE deficits/detail RLE Deficits / Details: 0/5 ankle, 2+/5, knee 2+-3-/5 hip RLE Sensation: decreased light touch RLE Coordination: decreased fine motor;decreased gross motor (due to weakness) Cervical / Trunk Assessment Cervical / Trunk Assessment: Other exceptions Cervical / Trunk Exceptions: h/o cervical spine surgery with Dr. Lovell Sheehan.  It is really bothering her neck how much pushing and pulling she has to do with her left arm.     Balance Balance Balance Assessed: Yes Static Sitting Balance Static Sitting - Balance Support: Left upper extremity supported;Feet supported Static Sitting - Level of Assistance: 6: Modified independent (Device/Increase time) Static Standing Balance Static Standing - Balance Support: Left upper extremity supported Static Standing - Level of Assistance: 4: Min assist Dynamic Standing Balance Dynamic Standing - Balance Support: Left upper extremity supported Dynamic Standing - Level of Assistance: 3: Mod assist  End of Session PT - End of Session Equipment Utilized During Treatment: Gait belt Activity Tolerance: Patient limited by fatigue;Patient limited by pain Patient left: in bed;with call bell/phone within reach;with family/visitor present    Lurena Joiner B. Alle Difabio, PT, DPT [phone]   10/12/2012, 5:09 PM

## 2012-10-12 NOTE — Progress Notes (Signed)
[facility] PROGRESS NOTE  [name] AVW:[id number] DOB: December 15, 1965 DOA: 10/11/2012 PCP: Pcp Not In System  Assessment/Plan: Principal Problem:   CVA (cerebral infarction) Active Problems:   Brain TIA   Hyperlipidemia   Sleep apnea   Diabetes   Tobacco abuse   COPD (chronic obstructive pulmonary disease)    1. Brain TIA/Recent CVA: Patient was diagnosed with CVA on 10/08/12, at [facility]. She now represents, with new right facial dysesthesiae, concerning for recurrent CVA vs TIA. Head CT scan revealed evidence of a subacute lacunar infarct in the left deep periventricular white matter. Dr Thana Farr provided neurology consultation, and complete CVA work up is in progress. Managing with low dose ASA for now. Further management will depend on findings. Meanwhile, for PT/OT.  2. Hyperlipidemia: Lipid profile showed TC 228, TG 374, HDL 22., LDL 131. Patient was on Zocor 20 mg daily, pre-CVA. Will increase dose to 40 mg daily.  3. Query Sleep apnea: Per patient , she had a sleep study several years ago, which was reportedly negative. Unable to locate this in EMR. 4. Diabetes: Managing with pre-admission oral hypoglycemics, Levemir and SSI. HBA1C is pending.  5. Tobacco abuse: Patient smokes about a pack to a pack and half of cigarettes per day. Counseled and commenced on Nicoderm patch.  6. COPD (chronic obstructive pulmonary disease): Stable/Asymptomatic.     Code Status: Full Code.  Family Communication:  Disposition Plan: to be determined.    Brief narrative: 47 y.o. female with hx of tobacco abuse, anxiety, hyperlipidemia, DM-2, query OSA, OA, s/p bilateral knee replacements, DJD, s/p cervical diskectomy/fusion, presents to the ER at [facility] complaining of right facial numbness on 10/11/12. Of note, she was diagnosed with internal capsule CVA about 5 days ago and was admitted to [facility], where she reportedly had carotid US, but no cardiac ECHO,  and discharged on ASA along with a statin. She has been compliant with these medications. She has residual right sided weakness and this hadn't changed, but she experienced facial numbness with no slurred speech or worsening weakness. She didn't have any H/A. Code stroke was called, but she wasn't a TPA candidate because of recent CVA. Dr Thad Ranger provided neurology consultation, and hospitalist was asked to admit for completing the stroke work up.   Consultants:  Dr Thana Farr, neurologist.   Procedures:  Head CT Scan.   Antibiotics:  N/A.   HPI/Subjective: No new issues.   Objective: Vital signs in last 24 hours: Temp:  [97.7 F (36.5 C)-98.4 F (36.9 C)] 97.9 F (36.6 C) (09/30 0700) Pulse Rate:  [81-108] 98 (09/30 0700) Resp:  [9-22] 22 (09/30 0700) BP: (90-116)/(52-79) 106/79 mmHg (09/30 0700) SpO2:  [92 %-100 %] 97 % (09/30 0700) Weight:  [113.399 kg (250 lb)-123.832 kg (273 lb)] 113.399 kg (250 lb) (09/29 2109) Weight change:     Intake/Output from previous day: 09/29 0701 - 09/30 0700 In: 240 [P.O.:240] Out: -      Physical Exam: General: Comfortable, alert, communicative, fully oriented, not short of breath at rest.  HEENT:  No clinical pallor, no jaundice, no conjunctival injection or discharge. Hydration is fair. NECK:  Supple, JVP not seen, no carotid bruits, no palpable lymphadenopathy, no palpable goiter. CHEST:  Clinically clear to auscultation, no wheezes, no crackles. HEART:  Sounds 1 and 2 heard, normal, regular, no murmurs. ABDOMEN:  Morbidly obese, soft, non-tender, no palpable organomegaly, no palpable masses, normal bowel sounds. GENITALIA:  Not examined. LOWER EXTREMITIES:  No pitting edema, palpable peripheral pulses. MUSCULOSKELETAL SYSTEM:  Unremarkable. CENTRAL NERVOUS SYSTEM:  Mild right facial asymmetry,  And right-sided limb weakness.  Lab Results:  Recent Labs  10/11/12 1542  WBC 15.7*  HGB 15.5*  HCT 45.8  PLT 279     Recent Labs  10/11/12 1542  NA 134*  K 3.9  CL 97  CO2 21  GLUCOSE 220*  BUN 15  CREATININE 0.62  CALCIUM 9.1   No results found for this or any previous visit (from the past 240 hour(s)).   Studies/Results: Ct Head (brain) Wo Contrast  10/11/2012   *RADIOLOGY REPORT*  Clinical Data: code stroke:  patient had stroke seen at [facility] on wednesday with right side affected, complaining of right side tingling today  CT HEAD WITHOUT CONTRAST  Technique:  Contiguous axial images were obtained from the base of the skull through the vertex without contrast.  Comparison: None.  Findings: There is a 12 x 14 mm ovoid focus of low attenuation in the deep periventricular white matter on the left, adjacent to the body of the left lateral ventricle.  This abnormality extends from the level of the posterior limb of the internal capsule cranially to nearly the upper edge of the ventricles.  There are no other areas of abnormal attenuation to suggest hemorrhage or infarct elsewhere.  There is no hydrocephalus.  There is mild diffuse atrophy.  The calvarium is intact.  IMPRESSION: Consistent with history, there is evidence of a subacute lacunar infarct in the left deep periventricular white matter.  Critical Value/emergent results were called by telephone at the time of interpretation on 10/11/2012 at 3:42 p.m. to Dr. Thad Ranger, who verbally acknowledged these results.   Original Report Authenticated By: Esperanza Heir, M.D.    Medications: Scheduled Meds: . ALPRAZolam  1 mg Oral BID  . aspirin  325 mg Oral Daily  . Dapagliflozin Propanediol  10 mg Oral Daily  . enoxaparin (LOVENOX) injection  40 mg Subcutaneous Q24H  . fluconazole  100 mg Oral Daily  . gabapentin  600 mg Oral QID  . HYDROcodone-acetaminophen  1 tablet Oral QID  . insulin aspart  0-15 Units Subcutaneous TID WC  . insulin aspart  0-5 Units Subcutaneous QHS  . insulin detemir  60 Units Subcutaneous Daily  . linagliptin  5 mg Oral Q  breakfast   And  . metFORMIN  1,000 mg Oral Q breakfast  . oxyCODONE  5 mg Oral Once  . pantoprazole  40 mg Oral Daily  . pioglitazone  45 mg Oral Q breakfast  . [START ON 10/13/2012] pneumococcal 23 valent vaccine  0.5 mL Intramuscular Tomorrow-1000  . simvastatin  20 mg Oral QPM   Continuous Infusions:  PRN Meds:.albuterol    LOS: 1 day   Quantae Martel,CHRISTOPHER  [facility] Pager [phone]. If 8PM-8AM, please contact night-coverage at [email or web address], password [facility] 10/12/2012, 7:54 AM  LOS: 1 day

## 2012-10-12 NOTE — Progress Notes (Signed)
[facility]  Patient Status: Active (receiving services up to time of hospitalization)  [facility] is providing the following services: PT - seen x1 by PT at home before this admission  If patient discharges after hours, please call [phone].   Jodene Nam 10/12/2012, 7:44 PM

## 2012-10-13 DIAGNOSIS — G4730 Sleep apnea, unspecified: Secondary | ICD-10-CM

## 2012-10-13 DIAGNOSIS — F17200 Nicotine dependence, unspecified, uncomplicated: Secondary | ICD-10-CM

## 2012-10-13 LAB — BASIC METABOLIC PANEL
BUN: 15 mg/dL (ref 6–23)
CO2: 22 mEq/L (ref 19–32)
Calcium: 8.9 mg/dL (ref 8.4–10.5)
Chloride: 100 mEq/L (ref 96–112)
Creatinine, Ser: 0.66 mg/dL (ref 0.50–1.10)
Glucose, Bld: 175 mg/dL — ABNORMAL HIGH (ref 70–99)
Potassium: 3.8 mEq/L (ref 3.5–5.1)

## 2012-10-13 LAB — GLUCOSE, CAPILLARY: Glucose-Capillary: 273 mg/dL — ABNORMAL HIGH (ref 70–99)

## 2012-10-13 MED ORDER — OXYCODONE HCL 5 MG PO TABS
5.0000 mg | ORAL_TABLET | Freq: Four times a day (QID) | ORAL | Status: DC
  Administered 2012-10-13 (×3): 5 mg via ORAL
  Filled 2012-10-13 (×3): qty 1

## 2012-10-13 MED ORDER — OXYCODONE-ACETAMINOPHEN 5-325 MG PO TABS: 1.0000 | ORAL_TABLET | Freq: Four times a day (QID) | ORAL | Status: DC

## 2012-10-13 MED ORDER — ASPIRIN 325 MG PO TABS: 325.0000 mg | ORAL_TABLET | Freq: Every day | ORAL | Status: DC

## 2012-10-13 MED ORDER — LORAZEPAM 2 MG/ML IJ SOLN: 2.0000 mg | Freq: Once | INTRAMUSCULAR | Status: DC | PRN

## 2012-10-13 MED ORDER — SIMVASTATIN 40 MG PO TABS: 40.0000 mg | ORAL_TABLET | Freq: Every evening | ORAL | Status: DC

## 2012-10-13 MED ORDER — LORAZEPAM 2 MG/ML IJ SOLN: 3.0000 mg | Freq: Once | INTRAMUSCULAR | Status: DC | PRN

## 2012-10-13 MED ORDER — OXYCODONE-ACETAMINOPHEN 5-325 MG PO TABS
1.0000 | ORAL_TABLET | Freq: Four times a day (QID) | ORAL | Status: DC
  Administered 2012-10-13 (×3): 1 via ORAL
  Filled 2012-10-13 (×3): qty 1

## 2012-10-13 NOTE — Clinical Social Work Note (Signed)
CSW met with pt at bedside regarding consult received from RN. Pt stated that she is currently having difficulties at home with affording groceries on a weekly basis and rent. CSW offered emotional support to the pt. CSW offered to provide pt with local resources for the pt's community. Pt was receptive and accepting of information provided to pt. No other needs were expressed.  Darlyn Chamber, MSW, LCSWA Clinical Social Work [phone]

## 2012-10-13 NOTE — Discharge Summary (Signed)
Physician Discharge Summary  [name] ZOX:[id number] DOB: January 31, 1965 DOA: 10/11/2012  PCP: Pcp Not In System  Admit date: 10/11/2012 Discharge date: 10/13/2012  Time spent: > 35 minutes  Recommendations for Outpatient Follow-up:  1. Please be sure have patient obtain open MRI for further evaluation given her presenting complaint. 2. Also patient to f/u with Neurology (Dr. Pearlean Brownie) in 2 months  Discharge Diagnoses:  Principal Problem:   CVA (cerebral infarction) Active Problems:   Brain TIA   Hyperlipidemia   Sleep apnea   Diabetes   Tobacco abuse   COPD (chronic obstructive pulmonary disease)   Discharge Condition: stable  Diet recommendation: diabetic  Filed Weights   10/11/12 1513 10/11/12 2109  Weight: 123.832 kg (273 lb) 113.399 kg (250 lb)    History of present illness:  From original HPI:  [name] is an 47 y.o. female with hx of tobacco abuse, hyperlipidemia, DM, sleep apnea, obesity, presents to the ER at [facility] complaining of right facial numbness today. Of note, she was diagnosed with internal capsule CVA about 5 days ago and was admitted to [facility], where she reportedly had carotid US, but no cardiac ECHO, and discharged on ASA along with a statin. She has been compliant with these medications. She has residual right sided weakness and this hadn't changed, but she experienced facial numbness with no slurred speech or worsening weakness. She didn't have any HA. Code stroke was called, but she wasn't a TPA candidate because of recent CVA. She was seen in consultation with Dr Jodi Mourning of neurology and hospitalist was asked to admit for completing the stroke work up.  Hospital Course:   1. Brain TIA/Recent CVA: Patient was diagnosed with CVA on 10/08/12, at [facility]. She presented with complaint of new right facial dysesthesiae, concerning for recurrent CVA vs TIA. Head CT scan revealed evidence of a subacute lacunar infarct in the  left deep periventricular white matter.  - Evaluated by Neurology who recommended the following: Continue aspirin 325 mg orally every day for secondary stroke prevention. Statin therapy MRI. Patient unable to do because of claustrophobia. Will need to do with an open MRI scanner. Will need to see Dr. Pearlean Brownie as an outpatient within 2 months and this can be set up at that appointment.  2. Hyperlipidemia: Lipid profile showed TC 228, TG 374, HDL 22., LDL 131. Patient was on Zocor 20 mg daily, pre-CVA. - Increased dose to 40 mg daily.   3. Query Sleep apnea:  - Per patient , she had a sleep study several years ago, which was reportedly negative. Patient to follow up with her PCP/neurologist for further evaluation and recommendations.  4. Diabetes:  - diabetic diet - recommend continuing home regimen - Patient to continue monitoring blood sugars daily  5. Tobacco abuse:  - Patient has been recommended tobacco cessation.  6. COPD (chronic obstructive pulmonary disease):  - Stable/Asymptomatic.   7. Chronic pain - Patient states that she is out of her home medication for her chronic pain.  She is currently requesting percocet stating she took this medication before for her chronic back pain as well as discomfort related to the physical therapy sessions she has had. - I will provide a small amount of pain medication until patient is able to reach the physician she is to follow up with for her chronic pain medication evaluation and recommendations.  Procedures:  CT of head  Consultations:  Neurology: Dr. Pearlean Brownie  Discharge Exam: Filed Vitals:  10/13/12 1400  BP: 113/75  Pulse: 82  Temp: 98.5 F (36.9 C)  Resp: 20    General: Pt in NAD, Alert and Awake Cardiovascular: RRR, no MRG Respiratory: CTA BL, no wheezes  Discharge Instructions  Discharge Orders   Future Orders Complete By Expires   Call MD for:  persistant dizziness or light-headedness  As directed    Call MD for:   severe uncontrolled pain  As directed    Call MD for:  temperature >100.4  As directed    Diet - low sodium heart healthy  As directed    Discharge instructions  As directed    Comments:     Please follow up with your neurologist in 1-2 months. You will need to set up follow up appointment and plans should be to set up open MRI test for further evaluation given your recent hospitalization.   Increase activity slowly  As directed        Medication List    STOP taking these medications       fluconazole 100 MG tablet  Commonly known as:  DIFLUCAN     HYDROcodone-acetaminophen 10-325 MG per tablet  Commonly known as:  NORCO     lisinopril 5 MG tablet  Commonly known as:  PRINIVIL,ZESTRIL      TAKE these medications       albuterol 108 (90 BASE) MCG/ACT inhaler  Commonly known as:  PROVENTIL HFA;VENTOLIN HFA  Inhale 2 puffs into the lungs every 6 (six) hours as needed for wheezing.     ALPRAZolam 1 MG tablet  Commonly known as:  XANAX  Take 1 mg by mouth 2 (two) times daily.     aspirin 325 MG tablet  Take 1 tablet (325 mg total) by mouth daily.     FARXIGA 10 MG Tabs  Generic drug:  Dapagliflozin Propanediol  Take 10 mg by mouth daily.     gabapentin 600 MG tablet  Commonly known as:  NEURONTIN  Take 600 mg by mouth 4 (four) times daily.     insulin detemir 100 UNIT/ML injection  Commonly known as:  LEVEMIR  Inject 60 Units into the skin daily.     oxyCODONE-acetaminophen 5-325 MG per tablet  Commonly known as:  PERCOCET/ROXICET  Take 1 tablet by mouth every 6 (six) hours.     pantoprazole 40 MG tablet  Commonly known as:  PROTONIX  Take 40 mg by mouth daily.     pioglitazone 45 MG tablet  Commonly known as:  ACTOS  Take 45 mg by mouth daily.     simvastatin 40 MG tablet  Commonly known as:  ZOCOR  Take 40 mg by mouth every evening.     sitaGLIPtin-metformin 50-1000 MG per tablet  Commonly known as:  JANUMET  Take 1 tablet by mouth daily.        Allergies  Allergen Reactions  . Tramadol Nausea And Vomiting and Other (See Comments)    Increase blood pressure      The results of significant diagnostics from this hospitalization (including imaging, microbiology, ancillary and laboratory) are listed below for reference.    Significant Diagnostic Studies: Ct Head (brain) Wo Contrast  10/11/2012   *RADIOLOGY REPORT*  Clinical Data: code stroke:  patient had stroke seen at [facility] on wednesday with right side affected, complaining of right side tingling today  CT HEAD WITHOUT CONTRAST  Technique:  Contiguous axial images were obtained from the base of the skull through the vertex without contrast.  Comparison: None.  Findings: There is a 12 x 14 mm ovoid focus of low attenuation in the deep periventricular white matter on the left, adjacent to the body of the left lateral ventricle.  This abnormality extends from the level of the posterior limb of the internal capsule cranially to nearly the upper edge of the ventricles.  There are no other areas of abnormal attenuation to suggest hemorrhage or infarct elsewhere.  There is no hydrocephalus.  There is mild diffuse atrophy.  The calvarium is intact.  IMPRESSION: Consistent with history, there is evidence of a subacute lacunar infarct in the left deep periventricular white matter.  Critical Value/emergent results were called by telephone at the time of interpretation on 10/11/2012 at 3:42 p.m. to Dr. Thad Ranger, who verbally acknowledged these results.   Original Report Authenticated By: Esperanza Heir, M.D.   Mr Cervical Spine Wo Contrast  09/30/2012   CLINICAL DATA:  47 year old female with severe neck pain, left shoulder and upper extremity pain.  EXAM: MRI CERVICAL SPINE WITHOUT CONTRAST  TECHNIQUE: Multiplanar, multisequence MR imaging was performed. No intravenous contrast was administered.  COMPARISON:  [facility] cervical spine radiographs 04/06/2012.  FINDINGS: Sequelae of C5-C6  ACDF. Hardware susceptibility artifact. Vertebral height and alignment appears stable. No definite marrow edema or acute osseous abnormality.  Large body habitus. Visualized paraspinal soft tissues are within normal limits.  Cervicomedullary junction is within normal limits. Spinal cord signal is within normal limits at all visualized levels.  C2-C3: Negative.  C3-C4: Uncovertebral hypertrophy greater on the right. Mild right C4 foraminal stenosis.  C4-C5: Right uncovertebral hypertrophy. Mild to moderate right C5 foraminal stenosis.  C5-C6: ACDF. Ligament flavum hypertrophy. Borderline to mild left C6 foraminal stenosis related to facet and uncovertebral hypertrophy.  C6-C7: Negative.  C7-T1: Negative.  Minimal upper thoracic disc bulging.  IMPRESSION: 1. C5-C6 ACDF with no adverse features identified. Perhaps mild left C6 foraminal stenosis.  2. No cervical spinal stenosis.  3.  Mild to moderate right C4 and C5 foraminal stenosis.   Electronically Signed   By: Augusto Gamble M.D.   On: 09/30/2012 19:36    Microbiology: No results found for this or any previous visit (from the past 240 hour(s)).   Labs: Basic Metabolic Panel:  Recent Labs Lab 10/11/12 1542 10/13/12 0610  NA 134* 135  K 3.9 3.8  CL 97 100  CO2 21 22  GLUCOSE 220* 175*  BUN 15 15  CREATININE 0.62 0.66  CALCIUM 9.1 8.9   Liver Function Tests:  Recent Labs Lab 10/11/12 1542  AST 12  ALT 15  ALKPHOS 71  BILITOT 0.1*  PROT 7.6  ALBUMIN 3.3*   No results found for this basename: LIPASE, AMYLASE,  in the last 168 hours No results found for this basename: AMMONIA,  in the last 168 hours CBC:  Recent Labs Lab 10/11/12 1542  WBC 15.7*  NEUTROABS 9.6*  HGB 15.5*  HCT 45.8  MCV 85.9  PLT 279   Cardiac Enzymes:  Recent Labs Lab 10/11/12 1542  TROPONINI <0.30   BNP: BNP (last 3 results) No results found for this basename: PROBNP,  in the last 8760 hours CBG:  Recent Labs Lab 10/12/12 0651 10/12/12 1155  10/12/12 2309 10/13/12 0639 10/13/12 1105  GLUCAP 183* 242* 146* 239* 273*       Signed:  Penny Pia  [facility] 10/13/2012, 4:57 PM

## 2012-10-13 NOTE — Evaluation (Signed)
Occupational Therapy Evaluation Patient Details Name: [name] MRN: [id number] DOB: 03-Aug-1965 Today's Date: 10/13/2012 Time: 9147-8295 OT Time Calculation (min): 22 min  OT Assessment / Plan / Recommendation History of present illness 47 y.o. female admitted to [facility] on 10/11/12 with hx of tobacco abuse, hyperlipidemia, DM, sleep apnea, obesity, presents to the ER at [facility] complaining of right facial numbness today.  Of note, she was diagnosed with internal capsule CVA about 5 days ago and was admitted to [facility], where she reportedly had carotid US, but no cardiac ECHO, and discharged on ASA along with a statin.  She has been compliant with these medications. She has residual right sided weakness and this hadn't changed, but she experienced facial numbness with no slurred speech or worsening weakness.  She didn't have any HA.  Code stroke was called, but she wasn't a TPA candidate because of recent CVA.     Clinical Impression   PT admitted with internal capsule CVA. Pt currently with functional limitiations due to the deficits listed below (see OT problem list).  Pt will benefit from skilled OT to increase their independence and safety with adls and balance to allow discharge HHOT. Pt very concerned with financial impact and becomes upset with discussing equipment. Pt demonstrates ability to progress with platform RW and recommend for d/c.      OT Assessment  Patient needs continued OT Services    Follow Up Recommendations  Home health OT;Supervision/Assistance - 24 hour (pt can not transfer without (A))    Barriers to Discharge      Equipment Recommendations  None recommended by OT    Recommendations for Other Services    Frequency  Min 2X/week    Precautions / Restrictions Precautions Precautions: Fall Precaution Comments: right hemiperesis   Pertinent Vitals/Pain Reports cervical pain Very tearful    ADL  Eating/Feeding: Set up Where Assessed -  Eating/Feeding: Bed level (eating with non dominant hand) Upper Body Dressing: Moderate assistance Where Assessed - Upper Body Dressing: Unsupported sitting Lower Body Dressing: Moderate assistance Where Assessed - Lower Body Dressing: Supported sit to stand Equipment Used: Gait belt;Rolling walker Transfers/Ambulation Related to ADLs: Pt completed sit<>Stand from EOB with much encourage ment. Pt very tearful throughout session and states "i can't during session" Pt with incr step length with motivation to return to eOB sitting ADL Comments: Pt verbalized during session feeling anxious and very scared about Rt side of body not moving. Pt educated on the benefit of therapy and need to keep progressing. Pt fixated on the size of therapist and their ability to pick patient up off the floor. Pt terminating task after 2 steps forward in platform walker and taking two steps backwards. Pt reports needing pain medication. Pt s anxiety is a limiting factor to progression.     OT Diagnosis: Generalized weakness;Hemiplegia dominant side  OT Problem List: Decreased strength;Decreased activity tolerance;Impaired balance (sitting and/or standing);Decreased safety awareness;Decreased knowledge of use of DME or AE;Decreased knowledge of precautions;Impaired UE functional use;Pain;Obesity;Impaired sensation;Decreased range of motion OT Treatment Interventions: Self-care/ADL training;Therapeutic exercise;Neuromuscular education;DME and/or AE instruction;Therapeutic activities;Patient/family education;Balance training   OT Goals(Current goals can be found in the care plan section) Acute Rehab OT Goals Patient Stated Goal: to get stronger, more independent, start walking again.  She wants to be able to keep up with her grandson.   OT Goal Formulation: With patient Time For Goal Achievement: 10/27/12 Potential to Achieve Goals: Good ADL Goals Pt Will Perform Grooming: standing;with mod assist  Pt Will Transfer to  Toilet: with mod assist;bedside commode (using platform walker for transfer) Additional ADL Goal #1: Pt will demonstrate basic transfer with platform RW MIN (A) with patient initiating RT UE positioning  Visit Information  Last OT Received On: 10/13/12 Assistance Needed: +2 PT/OT Co-Evaluation/Treatment: Yes History of Present Illness: 47 y.o. female admitted to [facility] on 10/11/12 with hx of tobacco abuse, hyperlipidemia, DM, sleep apnea, obesity, presents to the ER at [facility] complaining of right facial numbness today.  Of note, she was diagnosed with internal capsule CVA about 5 days ago and was admitted to [facility], where she reportedly had carotid US, but no cardiac ECHO, and discharged on ASA along with a statin.  She has been compliant with these medications. She has residual right sided weakness and this hadn't changed, but she experienced facial numbness with no slurred speech or worsening weakness.  She didn't have any HA.  Code stroke was called, but she wasn't a TPA candidate because of recent CVA.         Prior Functioning     Home Living Family/patient expects to be discharged to:: Private residence Living Arrangements: Spouse/significant other Available Help at Discharge: Family;Available 24 hours/day Type of Home: House Home Equipment: Walker - 2 wheels;Cane - quad;Bedside commode;Shower seat;Wheelchair - manual Additional Comments: husband was assisting with all transfers.  She was active with HHPT, but not OT.   Prior Function Level of Independence: Needs assistance Gait / Transfers Assistance Needed: min assist to transfer into and out of WC, BSC at home ADL's / Homemaking Assistance Needed: max to total assist.   Communication / Swallowing Assistance Needed: none needed.   Communication Communication: No difficulties Dominant Hand: Right         Vision/Perception Vision - Assessment Vision Assessment: Vision not tested Additional Comments: Pts level of  anxiety with therapist on arrival visual assessment to be further addressed next session   Cognition  Cognition Arousal/Alertness: Awake/alert Behavior During Therapy: Anxious;Agitated Overall Cognitive Status: Within Functional Limits for tasks assessed    Extremity/Trunk Assessment Upper Extremity Assessment Upper Extremity Assessment: RUE deficits/detail RUE Deficits / Details: unable to shrug shoulder, no scapula elevation elbow flexion / extension 3 out 5  bicep 3 out 5 tricep 3- out 5 no wrist activation, grasp 2 out 5 RUE Sensation: decreased light touch RUE Coordination: decreased gross motor;decreased fine motor Lower Extremity Assessment Lower Extremity Assessment: Defer to PT evaluation Cervical / Trunk Assessment Cervical / Trunk Exceptions: h/o of cervical surg with incr pain with RT UE deficits     Mobility Bed Mobility Supine to Sit: 4: Min guard Sitting - Scoot to Edge of Bed: 4: Min assist Sit to Supine: 5: Supervision;With rail;HOB flat Details for Bed Mobility Assistance: Min guard and min assist to get to EOB to help progress right leg over and give a small boost with first scooting out.  PT using railing for leverage with her left arm.  She uses momentum to her advantage to move around to EOB.  It takes her multiple tries at times and encouragement, but she does well.  Used hook technique of her right leg with her stronger left leg and momentum to get back into the bed.   Transfers Sit to Stand: 4: Min assist;With upper extremity assist;With armrests;From bed Stand to Sit: 4: Min assist;With upper extremity assist;With armrests;To bed Details for Transfer Assistance: min assist to stabilize pt for balance and provide some support at her trunk for transitions.  Two people present for safety and assisting with positioning right arm in hemi walker once standing.       Exercise     Balance Balance Balance Assessed: Yes Static Sitting Balance Static Sitting -  Balance Support: Left upper extremity supported;Feet supported Static Sitting - Level of Assistance: 6: Modified independent (Device/Increase time) Static Standing Balance Static Standing - Balance Support: Bilateral upper extremity supported Static Standing - Level of Assistance: 1: +2 Total assist;Patient percentage (comment) (pt 70% ) Static Standing - Comment/# of Minutes: with R PFRW and legs supported against the bed.   Dynamic Standing Balance Dynamic Standing - Balance Support: Bilateral upper extremity supported Dynamic Standing - Level of Assistance: 1: +2 Total assist;Patient percentage (comment) (60%) Dynamic Standing - Comments: with R PFRW    End of Session OT - End of Session Activity Tolerance: Patient tolerated treatment well Patient left: in bed;with call bell/phone within reach Nurse Communication: Mobility status;Precautions  GO     Harolyn Rutherford 10/13/2012, 1:35 PM Pager: [phone]

## 2012-10-13 NOTE — Progress Notes (Signed)
Pt SBP 97 (<100), MD paged. Pt asym. Will continue to monitor

## 2012-10-13 NOTE — Progress Notes (Signed)
Physical Therapy Treatment Patient Details Name: [name] MRN: [id number] DOB: 1965-06-29 Today's Date: 10/13/2012 Time: 1012-1054 PT Time Calculation (min): 42 min  PT Assessment / Plan / Recommendation  History of Present Illness 47 y.o. female admitted to [facility] on 10/11/12 with hx of tobacco abuse, hyperlipidemia, DM, sleep apnea, obesity, presents to the ER at [facility] complaining of right facial numbness today.  Of note, she was diagnosed with internal capsule CVA about 5 days ago and was admitted to [facility], where she reportedly had carotid US, but no cardiac ECHO, and discharged on ASA along with a statin.  She has been compliant with these medications. She has residual right sided weakness and this hadn't changed, but she experienced facial numbness with no slurred speech or worsening weakness.  She didn't have any HA.  Code stroke was called, but she wasn't a TPA candidate because of recent CVA.     PT Comments   Pt continues to do well physically, but mentally is self limited by anxiety (fear of falling), grief (of the loss of her independence and function) and pain (chronic neck pain).  She was able to stand with the right PFRW today and take 2 steps forward and two steps back to the bed with two skilled therapists assisting (PT and OT).  She physically could have gone further, but her fear and anxiety kicked in and she refused to keep trying.  Her phrase of choice right now is, "I can't" even though she can do most things we ask of her with assist.  We will continue to follow pt acutely and recommend resumption of HHPT and addition of HHOT at discharge.    Follow Up Recommendations  Home health PT;Supervision for mobility/OOB     Does the patient have the potential to tolerate intense rehabilitation    Yes  Barriers to Discharge   None      Equipment Recommendations    None right now   Recommendations for Other Services   None  Frequency Min 4X/week   Progress  towards PT Goals Progress towards PT goals: Progressing toward goals  Plan Current plan remains appropriate    Precautions / Restrictions Precautions Precautions: Fall Precaution Comments: right hemiperesis   Pertinent Vitals/Pain See vitals flow sheet.     Mobility  Bed Mobility Supine to Sit: 4: Min guard Sitting - Scoot to Edge of Bed: 4: Min assist Sit to Supine: 5: Supervision;With rail;HOB flat Details for Bed Mobility Assistance: Min guard and min assist to get to EOB to help progress right leg over and give a small boost with first scooting out.  PT using railing for leverage with her left arm.  She uses momentum to her advantage to move around to EOB.  It takes her multiple tries at times and encouragement, but she does well.  Used hook technique of her right leg with her stronger left leg and momentum to get back into the bed.   Transfers Transfers: Sit to Stand;Stand to Sit Sit to Stand: 4: Min assist;With upper extremity assist;With armrests;From bed Stand to Sit: 4: Min assist;With upper extremity assist;With armrests;To bed Details for Transfer Assistance: min assist to stabilize pt for balance and provide some support at her trunk for transitions.  Two people present for safety and assisting with positioning right arm in hemi walker once standing.   Ambulation/Gait Ambulation/Gait Assistance: 1: +2 Total assist Ambulation/Gait: Patient Percentage: 60% Ambulation Distance (Feet): 4 Feet Assistive device: Right platform walker Ambulation/Gait  Assistance Details: assist needed to help progress right leg forward to take steps, to stabilize trunk for balance and support trunk when WB over right leg.  Pt very fearful of falling, max encouragement needed to participate and ultimately pt gave up before making it the the chair.  She took 2 steps forward and two steps backward with both legs.  She did better stepping back because her hip extensors are stronger than her hip flexors and  ankle DFs.   Gait Pattern: Step-to pattern;Decreased step length - right;Decreased stance time - right;Decreased hip/knee flexion - right;Decreased dorsiflexion - right;Decreased weight shift to right;Right steppage;Right genu recurvatum General Gait Details: Pt's anxiety, pain, and greiving of the loss of her independence is interfering with her ability to participate with PT/OT.  Long discussion had about her potential, her abilit to do what she says she can't do and having to stay focused on getting better.   Modified Rankin (Stroke Patients Only) Pre-Morbid Rankin Score: Severe disability Modified Rankin: Severe disability      PT Goals (current goals can now be found in the care plan section) Acute Rehab PT Goals Patient Stated Goal: to get stronger, more independent, start walking again.  She wants to be able to keep up with her grandson.    Visit Information  Last PT Received On: 10/13/12 Assistance Needed: +2 (for safety with gait) History of Present Illness: 47 y.o. female admitted to [facility] on 10/11/12 with hx of tobacco abuse, hyperlipidemia, DM, sleep apnea, obesity, presents to the ER at [facility] complaining of right facial numbness today.  Of note, she was diagnosed with internal capsule CVA about 5 days ago and was admitted to [facility], where she reportedly had carotid US, but no cardiac ECHO, and discharged on ASA along with a statin.  She has been compliant with these medications. She has residual right sided weakness and this hadn't changed, but she experienced facial numbness with no slurred speech or worsening weakness.  She didn't have any HA.  Code stroke was called, but she wasn't a TPA candidate because of recent CVA.      Subjective Data  Subjective: Pt continuing to report "I can't" throughout session.  She is very emotional, anxious because she doesn't believe she can walk.  She is also hurting and request that future visits be timed with when she gets her pain  medicine.   Patient Stated Goal: to get stronger, more independent, start walking again.  She wants to be able to keep up with her grandson.     Cognition  Cognition Arousal/Alertness: Awake/alert Behavior During Therapy: Anxious;Agitated Overall Cognitive Status: Within Functional Limits for tasks assessed    Balance  Balance Balance Assessed: Yes Static Sitting Balance Static Sitting - Balance Support: Left upper extremity supported;Feet supported Static Sitting - Level of Assistance: 6: Modified independent (Device/Increase time) Static Standing Balance Static Standing - Balance Support: Bilateral upper extremity supported Static Standing - Level of Assistance: 1: +2 Total assist;Patient percentage (comment) (pt 70% ) Static Standing - Comment/# of Minutes: with R PFRW and legs supported against the bed.   Dynamic Standing Balance Dynamic Standing - Balance Support: Bilateral upper extremity supported Dynamic Standing - Level of Assistance: 1: +2 Total assist;Patient percentage (comment) (60%) Dynamic Standing - Comments: with R PFRW   End of Session PT - End of Session Equipment Utilized During Treatment: Gait belt Activity Tolerance: Patient limited by fatigue;Patient limited by pain;Treatment limited secondary to agitation (limited by her own fear  and anxiety) Patient left: in bed;with call bell/phone within reach Nurse Communication: Mobility status     Lurena Joiner B. Geraldo Haris, PT, DPT [phone]   10/13/2012, 11:58 AM

## 2012-10-13 NOTE — Progress Notes (Signed)
Patient is discharged from room 4N22 at this time. Alert and in stable condition. IV site d/c'd and also tele. Instructions read to both patient and her husband and understanding verbalized. Left facility via wheelchair with belongings.

## 2012-10-13 NOTE — Progress Notes (Signed)
10/12/12 1700  PT G-Codes **NOT FOR INPATIENT CLASS**  Functional Assessment Tool Used assist level  Functional Limitation Mobility: Walking and moving around  Mobility: Walking and Moving Around Current Status (W0981) CK  Mobility: Walking and Moving Around Goal Status (X9147) CJ  late g-code entry for eval preformed 10/12/12.  See that note for details.   Rollene Rotunda Curtis Cain, PT, DPT [phone]

## 2012-10-13 NOTE — Progress Notes (Signed)
Stroke Team Progress Note  HISTORY [name] is an 47 y.o. female who was recently seen on 10-06-12 in [place] [facility] for right arm and leg weakness. Patient had confirmed left IC CVA. While in [place] patient did have Carotid dopplers and blood tests but was discharged prior to having a Echocardiogram of her heart. Patient had residual right arm and leg weakness due to previous stroke, however at 11 AM today she noted decreased sensation of her right face which prompted patients husband to bring her to ED. Currently she continues to have decreased sensation of her right face but no worsening of her right arm or leg weakness. tPA was not given due to recent CVA. Patient was not on ASA prior to hospitalization in [place] and states she has been taking ASA daily since discharge. LDL at [facility] noted to be 127.   She was admitted for further evaluation and treatment.  SUBJECTIVE Patient going to bathroom, family in room. Patient feels better. Less tearful  OBJECTIVE Most recent Vital Signs: Filed Vitals:   10/13/12 0201 10/13/12 0546 10/13/12 1020 10/13/12 1103  BP: 95/54 103/59 88/55 97/58   Pulse: 83 87 82 79  Temp: 97.8 F (36.6 C) 98.9 F (37.2 C) 98.5 F (36.9 C)   TempSrc: Oral Oral Oral   Resp: 18 18 19    Height:      Weight:      SpO2: 98% 96% 98%    CBG (last 3)   Recent Labs  10/12/12 2309 10/13/12 0639 10/13/12 1105  GLUCAP 146* 239* 273*    IV Fluid Intake:     MEDICATIONS  . ALPRAZolam  1 mg Oral BID  . aspirin  325 mg Oral Daily  . Dapagliflozin Propanediol  10 mg Oral Daily  . enoxaparin (LOVENOX) injection  40 mg Subcutaneous Q24H  . gabapentin  600 mg Oral QID  . insulin aspart  0-15 Units Subcutaneous TID WC  . insulin aspart  0-5 Units Subcutaneous QHS  . insulin detemir  60 Units Subcutaneous Daily  . linagliptin  5 mg Oral Q breakfast   And  . metFORMIN  1,000 mg Oral Q breakfast  . nicotine  21 mg Transdermal Daily  . oxyCODONE-acetaminophen   1 tablet Oral Q6H   And  . oxyCODONE  5 mg Oral Q6H  . pantoprazole  40 mg Oral Daily  . pioglitazone  45 mg Oral Q breakfast  . simvastatin  40 mg Oral QPM   PRN:  albuterol, LORazepam, LORazepam  Diet:  Carb Control thin liquids Activity:  Bathroom privileges with assistance DVT Prophylaxis:  lovenox  CLINICALLY SIGNIFICANT STUDIES Basic Metabolic Panel:   Recent Labs Lab 10/11/12 1542 10/13/12 0610  NA 134* 135  K 3.9 3.8  CL 97 100  CO2 21 22  GLUCOSE 220* 175*  BUN 15 15  CREATININE 0.62 0.66  CALCIUM 9.1 8.9   Liver Function Tests:   Recent Labs Lab 10/11/12 1542  AST 12  ALT 15  ALKPHOS 71  BILITOT 0.1*  PROT 7.6  ALBUMIN 3.3*   CBC:   Recent Labs Lab 10/11/12 1542  WBC 15.7*  NEUTROABS 9.6*  HGB 15.5*  HCT 45.8  MCV 85.9  PLT 279   Coagulation:   Recent Labs Lab 10/11/12 1542  LABPROT 12.7  INR 0.97   Cardiac Enzymes:   Recent Labs Lab 10/11/12 1542  TROPONINI <0.30   Urinalysis: No results found for this basename: COLORURINE, APPERANCEUR, LABSPEC, PHURINE, GLUCOSEU, HGBUR, BILIRUBINUR, KETONESUR,  PROTEINUR, UROBILINOGEN, NITRITE, LEUKOCYTESUR,  in the last 168 hours Lipid Panel    Component Value Date/Time   CHOL 228* 10/12/2012 0845   TRIG 374* 10/12/2012 0845   HDL 22* 10/12/2012 0845   CHOLHDL 10.4 10/12/2012 0845   VLDL 75* 10/12/2012 0845   LDLCALC 131* 10/12/2012 0845   HgbA1C  Lab Results  Component Value Date   HGBA1C 10.5* 10/12/2012    Urine Drug Screen:     Component Value Date/Time   LABOPIA POSITIVE* 10/12/2012 0731   COCAINSCRNUR NONE DETECTED 10/12/2012 0731   LABBENZ NONE DETECTED 10/12/2012 0731   AMPHETMU NONE DETECTED 10/12/2012 0731   THCU POSITIVE* 10/12/2012 0731   LABBARB NONE DETECTED 10/12/2012 0731    Alcohol Level: No results found for this basename: ETH,  in the last 168 hours  Ct Head (brain) Wo Contrast 10/11/2012  Consistent with history, there is evidence of a subacute lacunar infarct in the  left deep periventricular white matter.    MRI of the brain    MRA of the brain    2D Echocardiogram EF 55%, systolic function normal,   LA size normal.  Carotid Doppler    EKG  normal sinus rhythm.   Therapy Recommendations   Physical Exam   Alert, oriented, thought content appropriate. Speech fluent without evidence of aphasia. Able to follow 3 step commands without difficulty.  Cranial Nerves:  II: Discs flat bilaterally; Visual fields grossly normal, pupils equal, round, reactive to light and accommodation  III,IV, VI: ptosis not present, extra-ocular motions intact bilaterally  V,VII: smile symmetric, facial light touch sensation decreased on the right  VIII: hearing normal bilaterally  IX,X: gag reflex present  XI: bilateral shoulder shrug  XII: midline tongue extension  Motor:  Right : Upper extremity 3/5 Left: Upper extremity 5/5  Lower extremity 3/5 Lower extremity 4/5  --able to hold arm antigravity but no strength against resistance  --difficulty taking part in heel to shin but able to bend leg at knee, not able keep leg of bed without falling to bed.  Tone and bulk:normal tone throughout; no atrophy noted  Sensory: Pinprick and light touch intact throughout, bilaterally  Deep Tendon Reflexes:  Right: Upper Extremity Left: Upper extremity  biceps (C-5 to C-6) 2/4 biceps (C-5 to C-6) 2/4  tricep (C7) 2/4 triceps (C7) 2/4  Brachioradialis (C6) 2/4 Brachioradialis (C6) 2/4  Lower Extremity Lower Extremity  quadriceps (L-2 to L-4) 1/4 quadriceps (L-2 to L-4) 1/4  Achilles (S1) 0/4 Achilles (S1) 0/4  Plantars:  Mute bilaterally  Cerebellar:  normal finger-to-nose on the left with dysmetria on the right secondary to strength, normal heel-to-shin on the left but unable to perform on the right due to strength  Gait: not assessed  CV: pulses palpable throughout  ASSESSMENT [name] is a 47 y.o. female presenting with decreased facial sensation. In addition,  she has continued to have right sided hemiparesis from stroke last week (was at [facility]). Imaging confirms a subacute lacunar infarct in the left deep periventricular white matter. Infarct felt to be embolic secondary to unknown source.  On aspirin 325 mg orally every day prior to admission. Now on aspirin 325 mg orally every day for secondary stroke prevention. Patient with resultant right sided hemiparesis, right face sensory loss. Work up underway.   Diabetes mellitus, 10.5, goal < 6.5  Sleep apnea  Hyperlipidemia, LDL 131, goal < 70 in diabetics  Polysubstance abuse  Hospital day # 2   TREATMENT/PLAN  Continue  aspirin 325 mg orally every day for secondary stroke prevention.  Statin therapy  MRI. Patient unable to do because of claustrophobia. Will need to do with an open MRI scanner. Will need to see Dr. Pearlean Brownie as an outpatient within 2 months and this can be set up at that appointment.  Gwendolyn Lima. Manson Passey, [facility], MBA, MHA [facility] Pager: [phone] 10/13/2012 2:46 PM  I have personally obtained a history, examined the patient, evaluated imaging results, and formulated the assessment and plan of care. I agree with the above.  Delia Heady, MD

## 2012-10-13 NOTE — Progress Notes (Signed)
Pt O4x up in bed . Pt have chronic in neck , meds given. NIH 5, pt w/rt right side weakness. Will continue to monitor

## 2012-10-15 NOTE — ED Provider Notes (Signed)
I saw and evaluated the patient, reviewed the resident's note and I agree with the findings and plan. Pt eval'd by neuro on arrival. Appears to have worsening of previous deficits. VS stable. No tPA initiated. Medicine to admit.   Loren Racer, MD 10/15/12 [phone]

## 2012-10-18 LAB — GLUCOSE, CAPILLARY: Glucose-Capillary: 196 mg/dL — ABNORMAL HIGH (ref 70–99)

## 2012-11-08 ENCOUNTER — Emergency Department: Payer: Self-pay | Admitting: Emergency Medicine

## 2012-11-08 LAB — CBC
HGB: 15.5 g/dL (ref 12.0–16.0)
MCHC: 33.3 g/dL (ref 32.0–36.0)
RBC: 5.35 10*6/uL — ABNORMAL HIGH (ref 3.80–5.20)
RDW: 14.3 % (ref 11.5–14.5)
WBC: 16.2 10*3/uL — ABNORMAL HIGH (ref 3.6–11.0)

## 2012-11-08 LAB — CK TOTAL AND CKMB (NOT AT ARMC): CK-MB: <0.5 ng/mL — ABNORMAL LOW (ref 0.5–3.6)

## 2012-11-08 LAB — COMPREHENSIVE METABOLIC PANEL
Albumin: 3.2 g/dL — ABNORMAL LOW (ref 3.4–5.0)
Anion Gap: 9 (ref 7–16)
BUN: 18 mg/dL (ref 7–18)
Bilirubin,Total: 0.3 mg/dL (ref 0.2–1.0)
Calcium, Total: 9.4 mg/dL (ref 8.5–10.1)
Chloride: 106 mmol/L (ref 98–107)
Co2: 22 mmol/L (ref 21–32)
EGFR (African American): >60
Glucose: 116 mg/dL — ABNORMAL HIGH (ref 65–99)
SGPT (ALT): 17 U/L (ref 12–78)
Sodium: 137 mmol/L (ref 136–145)
Total Protein: 7.5 g/dL (ref 6.4–8.2)

## 2012-12-06 ENCOUNTER — Ambulatory Visit: Payer: Self-pay | Admitting: Unknown Physician Specialty

## 2013-03-24 ENCOUNTER — Encounter: Payer: Self-pay | Admitting: Family Medicine

## 2013-04-13 ENCOUNTER — Encounter: Payer: Self-pay | Admitting: Family Medicine

## 2013-05-13 ENCOUNTER — Encounter: Payer: Self-pay | Admitting: Family Medicine

## 2013-06-13 ENCOUNTER — Encounter: Payer: Self-pay | Admitting: Family Medicine

## 2013-07-13 ENCOUNTER — Ambulatory Visit: Payer: Self-pay | Admitting: Internal Medicine

## 2013-07-13 LAB — CBC CANCER CENTER
COMMENT - H1-COM1: NORMAL
Comment - H1-Com2: NORMAL
HCT: 42.9 % (ref 35.0–47.0)
HGB: 14.3 g/dL (ref 12.0–16.0)
Lymphocytes: 38 %
MCH: 29.6 pg (ref 26.0–34.0)
MCHC: 33.5 g/dL (ref 32.0–36.0)
MCV: 88 fL (ref 80–100)
MONOS PCT: 9 %
NRBC/100 WBC: 0 /100
Platelet: 356 x10 3/mm (ref 150–440)
RBC: 4.85 10*6/uL (ref 3.80–5.20)
RDW: 14.5 % (ref 11.5–14.5)
SEGMENTED NEUTROPHILS: 53 %
WBC: 12.8 x10 3/mm — ABNORMAL HIGH (ref 3.6–11.0)

## 2013-07-13 LAB — URINALYSIS, COMPLETE
BACTERIA: NONE SEEN
BILIRUBIN, UR: NEGATIVE
BLOOD: NEGATIVE
GLUCOSE, UR: NEGATIVE mg/dL (ref 0–75)
Ketone: NEGATIVE
Leukocyte Esterase: NEGATIVE
Nitrite: NEGATIVE
PH: 7 (ref 4.5–8.0)
PROTEIN: NEGATIVE
RBC,UR: 1 /HPF (ref 0–5)
Specific Gravity: 1.012 (ref 1.003–1.030)
Squamous Epithelial: 1
WBC UR: <1 /HPF (ref 0–5)

## 2013-07-13 LAB — SEDIMENTATION RATE: Erythrocyte Sed Rate: 17 mm/hr (ref 0–20)

## 2013-08-02 ENCOUNTER — Emergency Department: Payer: Self-pay | Admitting: Emergency Medicine

## 2013-08-02 LAB — CBC WITH DIFFERENTIAL/PLATELET
Basophil #: 0 10*3/uL (ref 0.0–0.1)
Basophil %: 0.3 %
Eosinophil #: 0.1 10*3/uL (ref 0.0–0.7)
Eosinophil %: 0.9 %
HCT: 44 % (ref 35.0–47.0)
HGB: 14.3 g/dL (ref 12.0–16.0)
LYMPHS PCT: 30.8 %
Lymphocyte #: 4.5 10*3/uL — ABNORMAL HIGH (ref 1.0–3.6)
MCH: 29.2 pg (ref 26.0–34.0)
MCHC: 32.5 g/dL (ref 32.0–36.0)
MCV: 90 fL (ref 80–100)
Monocyte #: 1.3 x10 3/mm — ABNORMAL HIGH (ref 0.2–0.9)
Monocyte %: 8.7 %
Neutrophil #: 8.6 10*3/uL — ABNORMAL HIGH (ref 1.4–6.5)
Neutrophil %: 59.3 %
Platelet: 287 10*3/uL (ref 150–440)
RBC: 4.92 10*6/uL (ref 3.80–5.20)
RDW: 14.7 % — AB (ref 11.5–14.5)
WBC: 14.5 10*3/uL — AB (ref 3.6–11.0)

## 2013-08-02 LAB — COMPREHENSIVE METABOLIC PANEL
ALBUMIN: 3 g/dL — AB (ref 3.4–5.0)
ALT: 16 U/L (ref 12–78)
AST: 20 U/L (ref 15–37)
Alkaline Phosphatase: 70 U/L
Anion Gap: 5 — ABNORMAL LOW (ref 7–16)
BUN: 12 mg/dL (ref 7–18)
Bilirubin,Total: 0.3 mg/dL (ref 0.2–1.0)
CO2: 29 mmol/L (ref 21–32)
Calcium, Total: 8.7 mg/dL (ref 8.5–10.1)
Chloride: 104 mmol/L (ref 98–107)
Creatinine: 0.91 mg/dL (ref 0.60–1.30)
EGFR (Non-African Amer.): >60
Glucose: 210 mg/dL — ABNORMAL HIGH (ref 65–99)
OSMOLALITY: 282 (ref 275–301)
POTASSIUM: 3.9 mmol/L (ref 3.5–5.1)
SODIUM: 138 mmol/L (ref 136–145)
TOTAL PROTEIN: 7.7 g/dL (ref 6.4–8.2)

## 2013-08-02 LAB — TROPONIN I: Troponin-I: <0.02 ng/mL

## 2013-08-02 LAB — URINALYSIS, COMPLETE
Bacteria: NONE SEEN
Bilirubin,UR: NEGATIVE
Blood: NEGATIVE
Glucose,UR: 50 mg/dL (ref 0–75)
Ketone: NEGATIVE
Leukocyte Esterase: NEGATIVE
NITRITE: NEGATIVE
PROTEIN: NEGATIVE
Ph: 5 (ref 4.5–8.0)
RBC,UR: 1 /HPF (ref 0–5)
Specific Gravity: 1.017 (ref 1.003–1.030)
WBC UR: 1 /HPF (ref 0–5)

## 2013-08-02 LAB — LIPASE, BLOOD: Lipase: 229 U/L (ref 73–393)

## 2013-08-13 ENCOUNTER — Ambulatory Visit: Payer: Self-pay | Admitting: Internal Medicine

## 2013-09-22 ENCOUNTER — Emergency Department: Payer: Self-pay | Admitting: Emergency Medicine

## 2013-09-22 LAB — CBC
HCT: 44.3 % (ref 35.0–47.0)
HGB: 14.6 g/dL (ref 12.0–16.0)
MCH: 29.6 pg (ref 26.0–34.0)
MCHC: 33 g/dL (ref 32.0–36.0)
MCV: 90 fL (ref 80–100)
Platelet: 300 10*3/uL (ref 150–440)
RBC: 4.93 10*6/uL (ref 3.80–5.20)
RDW: 13.8 % (ref 11.5–14.5)
WBC: 14.6 10*3/uL — ABNORMAL HIGH (ref 3.6–11.0)

## 2013-09-22 LAB — COMPREHENSIVE METABOLIC PANEL
Albumin: 2.6 g/dL — ABNORMAL LOW (ref 3.4–5.0)
Alkaline Phosphatase: 78 U/L
Anion Gap: 8 (ref 7–16)
BUN: 11 mg/dL (ref 7–18)
Bilirubin,Total: 0.2 mg/dL (ref 0.2–1.0)
Calcium, Total: 8.3 mg/dL — ABNORMAL LOW (ref 8.5–10.1)
Chloride: 100 mmol/L (ref 98–107)
Co2: 27 mmol/L (ref 21–32)
Creatinine: 1.05 mg/dL (ref 0.60–1.30)
EGFR (African American): >60
EGFR (Non-African Amer.): >60
Glucose: 335 mg/dL — ABNORMAL HIGH (ref 65–99)
Osmolality: 283 (ref 275–301)
Potassium: 3.8 mmol/L (ref 3.5–5.1)
SGOT(AST): 17 U/L (ref 15–37)
SGPT (ALT): 16 U/L
Sodium: 135 mmol/L — ABNORMAL LOW (ref 136–145)
Total Protein: 7.2 g/dL (ref 6.4–8.2)

## 2013-09-22 LAB — LIPASE, BLOOD: Lipase: 162 U/L (ref 73–393)

## 2013-09-23 LAB — URINALYSIS, COMPLETE
Bacteria: NONE SEEN
Bilirubin,UR: NEGATIVE
Blood: NEGATIVE
Glucose,UR: >=500 mg/dL (ref 0–75)
Ketone: NEGATIVE
LEUKOCYTE ESTERASE: NEGATIVE
NITRITE: NEGATIVE
PH: 5 (ref 4.5–8.0)
PROTEIN: NEGATIVE
SPECIFIC GRAVITY: 1.03 (ref 1.003–1.030)
Squamous Epithelial: <1

## 2013-09-28 ENCOUNTER — Emergency Department: Payer: Self-pay | Admitting: Emergency Medicine

## 2014-01-11 ENCOUNTER — Other Ambulatory Visit: Payer: Self-pay | Admitting: Pain Medicine

## 2014-01-11 DIAGNOSIS — M545 Low back pain, unspecified: Secondary | ICD-10-CM

## 2014-01-21 ENCOUNTER — Other Ambulatory Visit: Payer: Medicare Other

## 2014-02-01 ENCOUNTER — Ambulatory Visit
Admission: RE | Admit: 2014-02-01 | Discharge: 2014-02-01 | Disposition: A | Discharge status: Home / Self Care | Payer: Medicare Other | Source: Ambulatory Visit | Attending: Pain Medicine | Admitting: Pain Medicine

## 2014-02-01 DIAGNOSIS — M79604 Pain in right leg: Secondary | ICD-10-CM

## 2014-02-01 DIAGNOSIS — M545 Low back pain: Secondary | ICD-10-CM

## 2014-02-01 MED ORDER — GADOBENATE DIMEGLUMINE 529 MG/ML IV SOLN
20.0000 mL | Freq: Once | INTRAVENOUS | Status: AC | PRN
  Administered 2014-02-01: 20 mL via INTRAVENOUS

## 2014-03-30 ENCOUNTER — Emergency Department: Payer: Self-pay | Admitting: Emergency Medicine

## 2014-05-02 NOTE — Discharge Summary (Signed)
PATIENT NAME:  [name], [name] MR#:  [id number] DATE OF BIRTH:  08/31/1965  DATE OF ADMISSION:  09/04/2011 DATE OF DISCHARGE:  09/08/2011  ADMITTING DIAGNOSIS: Left knee osteoarthritis.   DISCHARGE DIAGNOSIS: Left knee osteoarthritis.   PROCEDURE: Left total knee replacement.   SURGEON: Laurene Footman, M.D.   ANESTHESIA: General.   ESTIMATED BLOOD LOSS: 50.   COMPLICATIONS: None.   SPECIMEN: Cut ends of bone.   TOURNIQUET TIME: 90 minutes at 300 mmHg.   HISTORY: The patient is 49 year old with significant left knee pain. She has been having locking and has fallen several times because of her knee pain. She has had significant osteoarthritis on x-rays and My Knee CT confirms this. Her My Knee CT was reviewed as well as surgical plan. She has debilitating pain and felt that she needed to have something done to continue being ambulatory.   PHYSICAL EXAMINATION: LUNGS: Occasional scattered wheezing. HEART: Regular rate and rhythm. HEENT: Remarkable for upper and lower dentures and pierced tongue. On exam range of motion is 10 to 100 without instability. There is some slight valgus abnormality. She has moderate effusion with [name] Baker's cyst present. She does have some diminished sensation to the foot. She does have trace dorsalis pedis pulse. Skin is intact about the knee.   HOSPITAL COURSE: The patient was admitted to the hospital on 09/04/2011. She had surgery that same day and was brought to the orthopedic floor from the PAC-U unit in stable condition. On postoperative day one the patient was in severe pain and was found to be hypokalemic. The patient's hemoglobin stayed stable and her glucose levels were monitored throughout her stay. Throughout the patient's stay, the patient continued to have increased pain. The patient's pain was slightly improved with oxycodone and then oxycodone extended release. On postoperative day four the patient had progressed well with physical therapy. She was  stable and ready for discharge to home with Home Health.   DISCHARGE INSTRUCTIONS:  1. The patient may gradually increase weightbearing on the affected extremity.  2. She is to elevate the affected foot and leg on 1 or 2 pillows with the foot higher than the knee. Knee-high TED hose on both legs and remove at bedtime. She is to replace when arising the next morning.  3. She may resume [name] regular diet.  4. She is to resume typical home medications and take Xarelto at 10 mg once [name] day until gone. She also can take for pain Tylenol 650 to 1000 mg every six hours as needed for pain and OxyContin 10 mg every 12 hours as needed for pain, oxycodone 5 to 10 mg every four hours as needed for pain.  5. Wound care: Continue using the Polar Care unit maintaining temperature between 40 and 50 degrees.  6. Do not get the dressing or bandage wet or dirty. Call [facility] orthopedics if the dressing gets water under it. Leave dressing on.  7. Call [facility] orthopedics if any of the following occur: Bright red bleeding from the incision wound, fever above 101.5 degrees, redness, swelling, or drainage at the incision. Call [facility] orthopedics if you experience any increased leg pain, numbness, or weakness in legs or bowel or bladder symptoms.  8. She is referred for home physical therapy. She needs to continue rehab program. Please call [facility] orthopedics if [name] therapist has not contacted her within 48 hours of her return home. [facility] nurse will evaluate need for additional services upon return  home.    FOLLOWUP APPOINTMENT: Call [facility] orthopedics for follow-up appointment in two weeks.   DISCHARGE MEDICATIONS:  1. Gabapentin 600 mg oral tablet, 1 tablet orally 3 times [name] day. 2. Glyburide/metformin 2.5 mg /500 mg oral tablet, 2 tabs orally 2 times [name] day. 3. Diclofenac sodium 75 mg oral delayed release tablet, 1 tablet orally 2 times [name] day.  4. Vitamin D3, 1000  international units oral tablet, 1 tablet orally every other day at bedtime. Alprazolam 1 mg oral tablet, 1 tablet orally 3 times [name] day. 5. Azithromycin five-day Dosepak, 250 mg oral tablet, 1 tablet orally once [name] day for 4 days Cheratussin AC 10 mg 100 mg per 5 mL, 10 mL oral every four hours as needed.  6. Ventolin HFA 2 puffs inhaled 3 times [name] day. 7. Diovan HCT 320 mg per 25 mg oral tablet, 1 tablet orally once [name] day.    ____________________________ Duanne Guess, PA-C tcg:bjt D: 09/11/2011 13:10:58 ET T: 09/12/2011 09:24:58 ET JOB#: [id number]  cc: Duanne Guess, PA-C, <Dictator> Duanne Guess PA ELECTRONICALLY SIGNED 09/22/2011 14:14

## 2014-05-02 NOTE — Op Note (Signed)
PATIENT NAME:  [name], [name] A MR#:  [id number] DATE OF BIRTH:  1965/07/19  DATE OF PROCEDURE:  09/04/2011  PREOPERATIVE DIAGNOSIS: Left knee osteoarthritis.   POSTOPERATIVE DIAGNOSIS: Left knee osteoarthritis.   PROCEDURE: Left total knee replacement.    SURGEON: Laurene Footman, MD.   ANESTHESIA: General.   DESCRIPTION OF PROCEDURE: The patient was brought to the Operating Room and after adequate general anesthesia was obtained, the left leg was prepped and draped in the usual sterile fashion after application of a sterile tourniquet to the upper thigh and the Alvarado leg holder. After prepping and draping in the usual sterile fashion, appropriate patient identification was carried out. The leg was exsanguinated with an Esmarch and the tourniquet raised to 300 mmHg. A midline skin incision was made followed by a medial parapatellar arthrotomy. Inspection revealed tricompartmental arthritis with no normal cartilage remaining and exposed bone in all compartments. The anterior cruciate ligament was excised along with the fat pad and the proximal tibia exposed to allow for application of the proximal tibia cutting guide. When this was adequately positioned, drill holes were made and the proximal tibia cut carried out. The femur was approached in a similar fashion with the distal cut made. The four cutting block applied. Anterior and posterior chamfer cuts were carried out. The four tibial trial was inserted in the appropriate rotation based on the initial cutting block and notch cut made after drilling the center hole. The 10 mm trial insert placed along with a 4 femur and full extension could be obtained with good mid flexion stability. The drill holes were made for the distal femur at this time along with a notch cut in the trochlea. These components were removed. The patella was cut with the cutting guide and measured a size two. Three drill holes were made for this as well. At this point, the bony  surfaces were thoroughly irrigated and dried, residual meniscus excised. The tibial component was cemented into place first after infiltration of the posterior knee with 30 mL of 0.25% Sensorcaine with epinephrine. Tibial component was cemented in place with excess cement removed followed by placement of the polyethylene component followed by the femoral component. The knee was held in extension while the knee set and the patellar button was clamped into place. Implants implanted were the Medacta GMK primary tibial tray fixed left four, a GMK primary femoral component size left four and standard, a size two resurfacing patella and a size four, 10-mm UC tibial insert. After all implants had been set and the knee thoroughly irrigated and excess cement removed, there was some lateral tracking of the patella and a lateral release was carried out. Ethibond was used to repair the medial retinaculum along with a heavy quill suture, 2-0 quill subcutaneously, skin staples, Xeroform, 4 x 4's, ABD, Webril, Ace wrap, and Polar Care. The patient was sent to the recovery room in stable condition.   ESTIMATED BLOOD LOSS: 50.  COMPLICATIONS: None.  SPECIMEN: Cut ends of bone.  TOURNIQUET TIME: 90 minutes at 300 mmHg.  ____________________________ Laurene Footman, MD mjm:ap D: 09/04/2011 21:06:57 ET T: 09/05/2011 09:00:48 ET JOB#: [id number]  cc: Laurene Footman, MD, <Dictator> Laurene Footman MD ELECTRONICALLY SIGNED 09/05/2011 10:00

## 2014-05-02 NOTE — Op Note (Signed)
PATIENT NAME:  [name], [name] MR#:  [id number] DATE OF BIRTH:  [birth year]  DATE OF PROCEDURE:  09/04/2011  PROCEDURE: Left femoral nerve block.   INDICATION: To help this patient with postoperative pain about to have [name] left total knee arthroplasty by Dr. Hessie Knows.   ATTENDING: Florencia Zaccaro P. Phineas Douglas, MD   DESCRIPTION OF PROCEDURE: The risks and benefits of general anesthesia, spinal block, and femoral nerve block were discussed with the patient in [facility] Day Surgery preoperatively and consent was obtained. She decided she wished to proceed with general anesthesia and [name] femoral nerve block. She states that she had this same type of anesthesia last year for her other knee and that she has had multilevel fusion in her back and does not want anybody doing anything to her back.   She was brought to the PAC-U preoperatively. The usual monitors were applied. She was placed on nasal cannula oxygen. She was given [name] total of 5 mg of Versed intravenously for [name] very mild sedation. She does take benzodiazepines at home. We did do [name] time-out. She had [name] Betadine prep of her left upper thigh and groin area x3 and [name] sterile technique was used. [name] Stimuplex monitor and [name] 22-gauge 2-inch Stimuplex needle was used with 0.7 mA of output. The left femoral artery was identified. The needle was advanced approximately 1.5 cm lateral to the left femoral artery pulsation. There was good left thigh movement on the first pass. There was no heme, CSF, or paresthesias obtained. She had [name] total of 30 mL 0.25% bupivacaine with 1:400,000 of epinephrine injected incrementally. She tolerated the block without problem or complication. I am hopeful that the block will give her significant pain relief for the duration of the block.   ____________________________ Werner Lean. Phineas Douglas, MD spp:drc D: 09/04/2011 09:40:51 ET T: 09/04/2011 10:05:07 ET JOB#: [id number]  cc: Nicki Reaper P. Phineas Douglas, MD, <Dictator> Lucilla Edin MD ELECTRONICALLY SIGNED 09/04/2011  12:01

## 2014-05-05 NOTE — Discharge Summary (Signed)
PATIENT NAME:  [name], [name] MR#:  [id number] DATE OF BIRTH:  [birth year]  DATE OF ADMISSION:  10/06/2012 DATE OF DISCHARGE:  10/07/2012  PRESENTING COMPLAINT: Right leg numbness and heaviness.   DISCHARGE DIAGNOSES: 1.  Acute cerebrovascular accident, left posterior limb of internal capsule cerebrovascular accident. 2.  Chronic pain syndrome.  3.  Chronic neuropathy.  4.  Hypertension.  5.  Chronic headaches.  6.  Chronic tobacco abuse.   CONDITION ON DISCHARGE: Fair.  CODE STATUS: FULL CODE.  DISCHARGE MEDICATIONS: 1.  Lisinopril 5 mg daily.  2.  Gabapentin 300 mg 2 tablets 4 times [name] day.  3.  Pioglitazone 45 mg daily.  4.  Pantoprazole 40 mg daily.  5.  Farxiga 10 mg p.o. daily.  6.  Levemir 60 units daily at bedtime.  7.  Acetaminophen/hydrocodone 325/10 mg 1/2 to 1 tablet every 6 hours as needed for pain.  8.  Glyburide/metformin 2.5/500 mg 2 tablets twice [name] day.  9.  Simvastatin 20 mg at bedtime.  10.  Aspirin 325 mg p.o. daily.  11.  Alprazolam 1 mg every 8 hours as needed.   DISCHARGE INSTRUCTIONS:   1.  Home health physical therapy.  2.  Follow up with Dr. Brunetta Genera in 1 to 2 weeks.   DISCHARGE DIET: Low sodium, low fat, low cholesterol, carbohydrate-controlled diet.   LABORATORY AND DIAGNOSTICS: Radiological studies: Ultrasound carotid Doppler: Minimal atherosclerotic disease. No evidence of hemodynamically significant stenosis. MRI of the brain shows focal acute infarct, posterior limb of left internal capsule. CT of the head shows focal area of ill-defined low attenuation on the posterior aspect of the basal ganglia on the left. This has the appearance of [name] subacute region of lacunar infarction. No further focal acute abnormalities appreciated.   Cholesterol is 207. HDL 25. LDL is 127. Cardiac enzymes x 3 negative. Glucose is 181 and creatinine is 0.7. LFTs within normal limits.   EKG shows normal sinus rhythm.   H and H are 16.0 and 43.6. White count is 15.2. UA  is negative for UTI.  CONSULTATIONS: Physical therapy.   BRIEF SUMMARY OF HOSPITAL COURSE: [name] is [name] with history of type 2 diabetes, hypertension and hyperlipidemia who came into the Emergency Room with gradual onset of weakness on the right side starting with right leg progressing to the arm following transient facial droop. The patient was admitted with:  1.  Acute CVA. She was started on aspirin. Her MRI showed left posterior limb of internal capsule acute CVA. Carotid Dopplers remain negative. She was continued on her statins. Her symptoms improved and physical therapy saw the patient and recommended rehab, however, the patient requested to go home with home health PT. The patient wanted to attend her son[place] the [place] over the weekend and hence requested to be released to go home. She did not have any progression in her neurological symptoms.  2.  Type 2 diabetes. Continued her home dose insulin.  3.  Hypertension. Initially her BP meds were held, permissive hypertension was allowed; however, prior to discharge she was resumed back on her blood pressure meds. 4.  DVT prophylaxis. SCDs were prescribed. The patient was ambulatory.  Hospital stay otherwise remained stable. The patient remained [name] FULL CODE.   TIME SPENT: 40 minutes. ____________________________ Hart Rochester Posey Pronto, MD sap:sb D: 10/08/2012 07:30:13 ET T: 10/08/2012 07:48:13 ET JOB#: [id number]  cc: [name]. Posey Pronto, MD, <Dictator> Ilda Basset MD ELECTRONICALLY SIGNED 10/12/2012 14:13

## 2014-05-05 NOTE — H&P (Signed)
PATIENT NAME:  [name], [name] MR#:  [id number] DATE OF BIRTH:  18-Nov-1965  DATE OF ADMISSION:  10/06/2012  REFERRING PHYSICIAN: Dr. Hinda Kehr.   PRIMARY CARE PHYSICIAN: Dr. Brunetta Genera.   Chief complaint: weakness  HISTORY OF PRESENT ILLNESS: Ms. Nessel is a 49 year old Caucasian female with a past medical history of COPD non-O2-dependent, hypertension, type 2 diabetes requiring insulin therapy, as well as cervical spondylitis status post neck surgery back in July. She is presenting with gradual weakness. She originally noted a right leg cramping sensation and weakness at 5 a.m. the day of admission when she awoke, stating the cramping was 2 to 4 out of 10 in intensity, nonradiating. No worsening or relieving factors. She went back to sleep feeling that her symptoms would reside. When she awoke later around 8 to 9 a.m., her leg cramping had indeed resolved but the weakness remained. She stated that her leg felt heavy, and it was somewhat difficult to walk. Symptoms progressed and around noon she note that she now had a right-handed clumsiness and difficulty performing fine motor skills as well as associated right-handed weakness and stating that her right arm was heavier and she was having difficulty moving it. Once again, she is now complaining of difficulty walking because of right leg heaviness. Her symptoms persisted, and she presented to [facility] Emergency Department for workup and evaluation. She was found to have a left basal ganglia area of low attenuation on CT head. Throughout this from around 9 a.m., she was complaining of a global headache which is similar to her chronic headaches. No worsening or relieving factors. Intensity was from around 2 out of 10 without radiation.    REVIEW OF SYSTEMS:  CONSTITUTIONAL: Denies fevers, chills, fatigue.  EYES: Denies blurred vision, double vision or eye pain.  ENT: Denies ear pain or discharge, dysphagia.  RESPIRATORY: Denies cough, wheeze, shortness of  breath.  CARDIOVASCULAR: Denies chest pain, palpitations, edema.  GASTROINTESTINAL: Denies nausea, vomiting, diarrhea, abdominal pain.  GENITOURINARY: Denies hematuria or dysuria.  ENDOCRINE: Denies nocturia or thyroid problems.  HEMATOLOGY AND LYMPHATIC: Denies easy bruising or bleeding.  SKIN: Denies any rashes or lesions.  MUSCULOSKELETAL: Leg cramping as described above and limited range of motion of right arm, hand and right leg as described above.  NEUROLOGIC: She is complaining of numbness and weakness as above. Denies any dysarthria or tremor or ataxia and headache as above.  PSYCHIATRIC: Denies any anxiety or depressive symptoms.   Otherwise, full review of systems performed by me is negative.   PAST MEDICAL HISTORY: Type 2 diabetes requiring insulin therapy, gastroesophageal reflux disease, COPD, cervical spondylitis status post neck surgery. She also has a history of knee osteoarthritis requiring replacements bilaterally and 2 back surgeries.   FAMILY HISTORY: She denies any cardiovascular or pulmonary disease. She denies any neurological disease including strokes.   SOCIAL HISTORY: She denies any alcohol usage, though she does smoke about 1-1/2 packs daily for many years.   ALLERGIES: MORPHINE, stating she cannot tolerate morphine, and TRAMADOL which causes nausea, vomiting and diarrhea.   HOME MEDICATIONS: Include acetaminophen/hydrocodone 325/10 mg 1 tab p.o. q.6 hours as needed for pain, Farxiga 10 mg p.o. daily, gabapentin 300 mg 2 tabs 4 times daily, glyburide/metformin 2.5/500 mg 2 tabs p.o. b.i.d., Levemir 60 units subcutaneous at bedtime, lisinopril 5 mg p.o. daily, pioglitazone 45 p.o. daily, simvastatin 25 mg p.o. at bedtime.   PHYSICAL EXAMINATION:  VITAL SIGNS: Temperature 97.6, heart rate 87, respirations 18, blood pressure 96/58, saturating 95%  on room air. Weight 123.4 kg, BMI of 43.9.  GENERAL: Well-nourished, obese, well-developed female in no acute distress.   HEAD: Normocephalic, atraumatic.  EYES: Pupils equal, round and reactive light. Extraocular muscles intact. There is no icterus.  MOUTH: Moist mucosal membranes. Dentition intact. No abscesses.  EARS, NOSE AND THROAT: Throat is clear without exudate. No external lesions noted.  NECK: Supple. No thyromegaly or nodules. No JVD.  CARDIOVASCULAR: S1, S2, regular rate and rhythm. No murmurs, rubs or gallops. No edema. Pedal pulses 2+ bilaterally.  PULMONARY: Clear to auscultation bilaterally without wheezes, rubs or rhonchi. No use of accessory muscles. Good respiratory effort.  CHEST: Nontender to palpation.  GASTROINTESTINAL: Soft, nontender, nondistended, obese. No masses palpated. Positive bowel sounds. No hepatosplenomegaly.  MUSCULOSKELETAL: There is no swelling, edema or clubbing. Range of motion limited on the right side in the upper and lower extremities. Otherwise, left extremities full range of motion.  NEUROLOGIC: She has a mild right-sided facial droop. Sensation is decreased on the right face, essentially entire right side of her body, right upper extremity, the lower extremity and the right face, more pronounced in the face than other regions. Once again, right-sided facial droop. Right upper extremity strength is 3 out of five in comparison to the left. Hand grip is 2 out of 5 in comparison to the left. Right lower extremity of all muscle groups strength is 2 out of 5 when compared to the left. Sensation is decreased as well.  PSYCHIATRIC: Mood and affect within normal limits. She is awake, alert and x 3. Insight and judgment are intact.   LABORATORY DATA: Sodium 133, potassium 4, chloride 102, bicarb 24, BUN 15, creatinine 0.7, glucose 181. Total protein 7.8, albumin 3.2, bili 0.4, alk phos 84, AST 21, ALT 18. Troponin I less than 0.02. WBC 15.2, hemoglobin 16, platelets 365. Urinalysis negative for infection. There is glucosuria greater than 500. EKG: Normal sinus rhythm, heart rate 94.  CT head with left basal ganglia area of low attenuation. Read as subacute lacunar infarct.    ASSESSMENT AND PLAN: A 49 year old female with history of chronic obstructive pulmonary disease, hypertension, type 2 diabetes who is insulin requiring, presenting with gradual onset of weakness, right-sided, starting with right leg, progressing to arm following facial droop. Stroke scale on arrival to the Emergency Room 7. Found to have left basal ganglia area of low attenuation on CT head.   1. Acute cerebrovascular accident: Give aspirin. Check lipids. Check an MRI of brain, carotid Dopplers. Also give statin for risk factor identification and modification. Every 4 hour neurologic checks as well as a bedside swallow evaluation. The patient is n.p.o. until that time.  2. Type 2 diabetes, insulin requiring: Give her 1/2 dose of her Levemir which would be 30 units at bedtime. Given her n.p.o. status, will hold all p.o. agents. Add insulin sliding scale. She may resume home dose Levemir when tolerating p.o.   3. Hypertension: She is currently relatively hypotensive. Will hold her ACE inhibitor. Allow systolic blood pressures up to 180 given acute cerebrovascular accident.  4. Deep vein thrombosis prophylaxis: Place on sequential compression devices.   She is FULL CODE.   TIME SPENT: 45 minutes.   ____________________________ Aaron Mose. Hower, MD dkh:gb D: 10/06/2012 23:08:23 ET T: 10/06/2012 23:28:57 ET JOB#: [id number]  cc: Aaron Mose. Hower, MD, <Dictator> DAVID Woodfin Ganja MD ELECTRONICALLY SIGNED 10/07/2012 0:11

## 2014-05-07 NOTE — Op Note (Signed)
PATIENT NAME:  [name], SORTINO A MR#:  [id number] DATE OF BIRTH:  06-May-1965  DATE OF PROCEDURE:  04/10/2011  PREOPERATIVE DIAGNOSIS: Internal derangement of left knee with early degenerative arthritis.   POSTOPERATIVE DIAGNOSES:  1. Complex tear of medial meniscus, left knee. 2. Complex tear of lateral meniscus, left knee.  3. Grade III chondral changes about the patellofemoral joint and medial compartment.   PROCEDURES PERFORMED: 1. Diagnostic and operative arthroscopy with partial medial and lateral meniscectomy, left knee.  2. Limited chondroplasty and shaving, left knee.   SURGEON: Alysia Penna. Youa Deloney, M.D.   ASSISTANT: None.  ANESTHESIA: General.   ESTIMATED BLOOD LOSS: Negligible.  BRIEF CLINICAL NOTE AND PATHOLOGY: The patient had persistent left knee pain with mechanical symptoms. Work-up showed evidence of early degenerative arthritis as well as internal derangement. Options, risks, and benefits were discussed. She elected to proceed with operative intervention. At the time of the procedure, partial medial and lateral meniscectomies were performed. Approximately 50% of the medial meniscus and 30% of the lateral meniscus were removed. There was very significant patellofemoral arthritis.   DESCRIPTION OF PROCEDURE: Preop antibiotics, adequate general anesthesia, supine position, routine prepping and draping. Appropriate time out was called. The arthroscope was introduced through the lateral joint line portal. Medial portal was made with the guidance of a spinal needle. The knee was inspected with the above-mentioned pathology found. Very limited chondroplasty and shaving was performed about the medial compartment. There were significant degenerative changes about the patellofemoral joint. Approximately 5 minutes was spent in this compartment.   The incision was thoroughly irrigated and reinspected. No other pathology was within the knee. The patella did track nicely.   The knee was again  thoroughly irrigated and drained of fluid. Portals were closed with subcuticular Vicryl. Skin glue was used.   The knee was injected with Marcaine, epinephrine, and morphine. A soft sterile dressing was applied. The patient was awakened and taken to the postanesthesia care unit having tolerated the procedure well.  ____________________________ Alysia Penna. Mauri Pole, MD jcc:slb D: 04/10/2011 10:40:15 ET T: 04/10/2011 11:29:26 ET JOB#: [id number]  cc: Alysia Penna. Mauri Pole, MD, <Dictator> Alysia Penna Aroush Chasse MD ELECTRONICALLY SIGNED 04/14/2011 23:44

## 2014-08-02 ENCOUNTER — Other Ambulatory Visit: Payer: Medicare Other

## 2014-08-02 ENCOUNTER — Ambulatory Visit: Payer: Medicare Other | Admitting: Internal Medicine

## 2014-08-02 ENCOUNTER — Inpatient Hospital Stay: Payer: Medicare Other | Admitting: Oncology

## 2014-08-02 ENCOUNTER — Inpatient Hospital Stay: Payer: Medicare Other

## 2014-09-08 ENCOUNTER — Inpatient Hospital Stay: Payer: Medicare Other | Admitting: Internal Medicine

## 2014-09-08 ENCOUNTER — Inpatient Hospital Stay: Payer: Medicare Other

## 2014-09-25 ENCOUNTER — Inpatient Hospital Stay: Payer: Medicare Other | Admitting: Internal Medicine

## 2014-09-25 ENCOUNTER — Inpatient Hospital Stay: Payer: Medicare Other | Attending: Internal Medicine

## 2014-10-15 ENCOUNTER — Encounter: Payer: Self-pay | Admitting: *Deleted

## 2014-10-16 ENCOUNTER — Telehealth: Payer: Self-pay | Admitting: *Deleted

## 2014-10-16 NOTE — Telephone Encounter (Signed)
Pt was last seen 07/27/13. Pt was no show 3 times for labs and 3 times for f/u appt 1 year later.  Due to this we have sent letter to pt. Closing her chart to our services. Mailed today 10/3

## 2015-07-26 ENCOUNTER — Other Ambulatory Visit: Payer: Self-pay | Admitting: Family Medicine

## 2015-07-26 DIAGNOSIS — M545 Low back pain: Secondary | ICD-10-CM

## 2015-08-09 ENCOUNTER — Other Ambulatory Visit: Payer: Self-pay | Admitting: Family Medicine

## 2015-08-09 DIAGNOSIS — M545 Low back pain: Secondary | ICD-10-CM

## 2015-08-09 DIAGNOSIS — M25551 Pain in right hip: Secondary | ICD-10-CM

## 2015-08-15 ENCOUNTER — Other Ambulatory Visit: Payer: Medicare Other

## 2015-08-15 ENCOUNTER — Other Ambulatory Visit: Payer: Self-pay | Admitting: Specialist

## 2015-08-15 DIAGNOSIS — M7989 Other specified soft tissue disorders: Secondary | ICD-10-CM

## 2015-08-21 ENCOUNTER — Other Ambulatory Visit: Payer: Medicare Other

## 2015-08-21 ENCOUNTER — Inpatient Hospital Stay
Admission: RE | Admit: 2015-08-21 | Discharge: 2015-08-21 | Disposition: A | Discharge status: Home / Self Care | Payer: Medicare Other | Source: Ambulatory Visit | Attending: Family Medicine | Admitting: Family Medicine

## 2015-08-29 ENCOUNTER — Other Ambulatory Visit: Payer: Medicare Other

## 2015-09-08 ENCOUNTER — Inpatient Hospital Stay: Admission: RE | Admit: 2015-09-08 | Payer: Medicare Other | Source: Ambulatory Visit

## 2015-09-08 ENCOUNTER — Other Ambulatory Visit: Payer: Medicare Other

## 2015-10-03 ENCOUNTER — Ambulatory Visit
Admission: RE | Admit: 2015-10-03 | Discharge: 2015-10-03 | Disposition: A | Discharge status: Home / Self Care | Payer: Medicare Other | Source: Ambulatory Visit | Attending: Family Medicine | Admitting: Family Medicine

## 2015-10-03 ENCOUNTER — Ambulatory Visit
Admission: RE | Admit: 2015-10-03 | Discharge: 2015-10-03 | Disposition: A | Discharge status: Home / Self Care | Payer: Medicare Other | Source: Ambulatory Visit | Attending: Specialist | Admitting: Specialist

## 2015-10-03 DIAGNOSIS — M545 Low back pain: Secondary | ICD-10-CM

## 2015-10-03 DIAGNOSIS — M7989 Other specified soft tissue disorders: Secondary | ICD-10-CM

## 2015-10-03 DIAGNOSIS — M25551 Pain in right hip: Secondary | ICD-10-CM

## 2015-11-07 ENCOUNTER — Other Ambulatory Visit: Payer: Self-pay | Admitting: Family Medicine

## 2015-11-07 DIAGNOSIS — M545 Low back pain, unspecified: Secondary | ICD-10-CM

## 2015-11-22 ENCOUNTER — Other Ambulatory Visit: Payer: Medicare Other

## 2015-11-29 ENCOUNTER — Other Ambulatory Visit: Payer: Medicare Other

## 2016-01-28 ENCOUNTER — Ambulatory Visit
Admission: RE | Admit: 2016-01-28 | Discharge: 2016-01-28 | Disposition: A | Discharge status: Home / Self Care | Payer: Medicare Other | Source: Ambulatory Visit | Attending: Family Medicine | Admitting: Family Medicine

## 2016-01-28 DIAGNOSIS — M545 Low back pain, unspecified: Secondary | ICD-10-CM

## 2016-01-28 MED ORDER — GADOBENATE DIMEGLUMINE 529 MG/ML IV SOLN
20.0000 mL | Freq: Once | INTRAVENOUS | Status: AC | PRN
  Administered 2016-01-28: 20 mL via INTRAVENOUS

## 2016-07-01 ENCOUNTER — Emergency Department
Admission: EM | Admit: 2016-07-01 | Discharge: 2016-07-01 | Disposition: A | Discharge status: Home / Self Care | Payer: Medicare Other | Attending: Emergency Medicine | Admitting: Emergency Medicine

## 2016-07-01 ENCOUNTER — Emergency Department: Payer: Medicare Other

## 2016-07-01 ENCOUNTER — Encounter: Payer: Self-pay | Admitting: Emergency Medicine

## 2016-07-01 DIAGNOSIS — E119 Type 2 diabetes mellitus without complications: Secondary | ICD-10-CM | POA: Diagnosis not present

## 2016-07-01 DIAGNOSIS — Z8505 Personal history of malignant neoplasm of liver: Secondary | ICD-10-CM | POA: Insufficient documentation

## 2016-07-01 DIAGNOSIS — Z79899 Other long term (current) drug therapy: Secondary | ICD-10-CM | POA: Insufficient documentation

## 2016-07-01 DIAGNOSIS — Z96653 Presence of artificial knee joint, bilateral: Secondary | ICD-10-CM | POA: Insufficient documentation

## 2016-07-01 DIAGNOSIS — Z8673 Personal history of transient ischemic attack (TIA), and cerebral infarction without residual deficits: Secondary | ICD-10-CM | POA: Insufficient documentation

## 2016-07-01 DIAGNOSIS — F17210 Nicotine dependence, cigarettes, uncomplicated: Secondary | ICD-10-CM | POA: Insufficient documentation

## 2016-07-01 DIAGNOSIS — Z7982 Long term (current) use of aspirin: Secondary | ICD-10-CM | POA: Diagnosis not present

## 2016-07-01 DIAGNOSIS — J441 Chronic obstructive pulmonary disease with (acute) exacerbation: Secondary | ICD-10-CM | POA: Insufficient documentation

## 2016-07-01 DIAGNOSIS — R0602 Shortness of breath: Secondary | ICD-10-CM | POA: Diagnosis present

## 2016-07-01 DIAGNOSIS — R079 Chest pain, unspecified: Secondary | ICD-10-CM

## 2016-07-01 LAB — BASIC METABOLIC PANEL
Anion gap: 10 (ref 5–15)
BUN: 15 mg/dL (ref 6–20)
CHLORIDE: 101 mmol/L (ref 101–111)
CO2: 24 mmol/L (ref 22–32)
CREATININE: 0.89 mg/dL (ref 0.44–1.00)
Calcium: 9.1 mg/dL (ref 8.9–10.3)
GFR calc Af Amer: >60 mL/min (ref >60)
GFR calc non Af Amer: >60 mL/min (ref >60)
GLUCOSE: 246 mg/dL — AB (ref 65–99)
POTASSIUM: 3.5 mmol/L (ref 3.5–5.1)
SODIUM: 135 mmol/L (ref 135–145)

## 2016-07-01 LAB — CBC
HCT: 43.6 % (ref 35.0–47.0)
Hemoglobin: 14.7 g/dL (ref 12.0–16.0)
MCH: 29.2 pg (ref 26.0–34.0)
MCHC: 33.7 g/dL (ref 32.0–36.0)
MCV: 86.7 fL (ref 80.0–100.0)
PLATELETS: 320 10*3/uL (ref 150–440)
RBC: 5.03 MIL/uL (ref 3.80–5.20)
RDW: 15 % — ABNORMAL HIGH (ref 11.5–14.5)
WBC: 11.9 10*3/uL — ABNORMAL HIGH (ref 3.6–11.0)

## 2016-07-01 LAB — TROPONIN I: Troponin I: <0.03 ng/mL (ref <0.03)

## 2016-07-01 MED ORDER — OXYCODONE-ACETAMINOPHEN 5-325 MG PO TABS
1.0000 | ORAL_TABLET | Freq: Once | ORAL | Status: AC
  Administered 2016-07-01: 1 via ORAL
  Filled 2016-07-01: qty 1

## 2016-07-01 NOTE — ED Triage Notes (Signed)
Pt arrived via ems from home. Pt reports have a stressful experience with her neighbor tonight that led to chest pain and shortness of breath. Upon arrival pt was on 4l of oxygen and ems reports giving 1 albuterol treatment in route. Pt's room air saturation 100%, alert & oriented x 4, and in no apparent distress.

## 2016-07-01 NOTE — ED Notes (Signed)
Pt. Verbalizes understanding of d/c instructions and follow-up. VS stable and pain controlled per pt.  Pt. In NAD at time of d/c and denies further concerns regarding this visit. Pt. Stable at the time of departure from the unit, departing unit by the safest and most appropriate manner per that pt condition and limitations. Pt advised to return to the ED at any time for emergent concerns, or for new/worsening symptoms.   

## 2016-07-01 NOTE — ED Provider Notes (Signed)
[facility] Emergency Department Provider Note    First MD Initiated Contact with Patient 07/01/16 [phone]     (approximate)  I have reviewed the triage vital signs and the nursing notes.   HISTORY  Chief Complaint Shortness of Breath and Chest Pain   HPI [name] is a 51 y.o. female presents with below list of chronic medical conditions presents to the emergency department with acute onset of shortness breath and chest pain while having a verbal altercation with her neighbor. Patient states that her neighbor has been "upsetting her all day and then tonight while having a verbal altercation with her neighbor she experienced acute onset of central 7 out of 10 nonradiating chest pain accompanied by nausea. Patient states since EMS arrival pain is improved currently 3 out of 10 still nonradiating. In addition the patient states her dyspnea is resolved along an albuterol breathing treatment while in route. Patient denies any previous history of heart disease. Patient denies any history of DVT or PE.   Past Medical History:  Diagnosis Date  . Anxiety   . Cancer ([place])    a spot on liver and treated   . Complication of anesthesia    restless,easily upset  . COPD (chronic obstructive pulmonary disease) ([place])   . Diabetes mellitus without complication ([place])   . Headache(784.0)   . Restless   . Sleep apnea    neg test  . Stroke [facility])     Patient Active Problem List   Diagnosis Date Noted  . CVA (cerebral infarction) 10/11/2012  . Brain TIA 10/11/2012  . Hyperlipidemia 10/11/2012  . Sleep apnea 10/11/2012  . Diabetes ([place]) 10/11/2012  . Tobacco abuse 10/11/2012  . COPD (chronic obstructive pulmonary disease) ([place]) 10/11/2012    Past Surgical History:  Procedure Laterality Date  . ABDOMINAL HYSTERECTOMY    . ANTERIOR CERVICAL DECOMP/DISCECTOMY FUSION N/A 03/11/2012   Procedure: ANTERIOR CERVICAL DECOMPRESSION/DISCECTOMY FUSION 1 LEVEL;  Surgeon:  Ophelia Charter, MD;  Location: [facility];  Service: Neurosurgery;  Laterality: N/A;  Cervical five-six anterior cervical decompression with fusion interbody prothesis plating and bonegraft  . BACK SURGERY    . EYE SURGERY    . HERNIA REPAIR    . JOINT REPLACEMENT     bil knees    Prior to Admission medications   Medication Sig Start Date End Date Taking? Authorizing Provider  albuterol (PROVENTIL HFA;VENTOLIN HFA) 108 (90 BASE) MCG/ACT inhaler Inhale 2 puffs into the lungs every 6 (six) hours as needed for wheezing.    [provider]  ALPRAZolam Duanne Moron) 1 MG tablet Take 1 mg by mouth 2 (two) times daily.     [provider]  aspirin 325 MG tablet Take 1 tablet (325 mg total) by mouth daily. 10/13/12   Velvet Bathe, MD  FARXIGA 10 MG TABS Take 10 mg by mouth daily. 09/29/12   [provider]  gabapentin (NEURONTIN) 600 MG tablet Take 600 mg by mouth 4 (four) times daily.    [provider]  insulin detemir (LEVEMIR) 100 UNIT/ML injection Inject 60 Units into the skin daily.    [provider]  oxyCODONE-acetaminophen (PERCOCET/ROXICET) 5-325 MG per tablet Take 1 tablet by mouth every 6 (six) hours. 10/13/12   Velvet Bathe, MD  pantoprazole (PROTONIX) 40 MG tablet Take 40 mg by mouth daily. 09/29/12   [provider]  pioglitazone (ACTOS) 45 MG tablet Take 45 mg by mouth daily.    [provider]  simvastatin (ZOCOR) 40 MG tablet Take 1 tablet (40 mg total) by mouth every evening. 10/13/12   Velvet Bathe, MD  sitaGLIPtin-metformin (JANUMET) 50-1000 MG per tablet Take 1 tablet by mouth daily.    [provider]    Allergies Tramadol and Augmentin [amoxicillin-pot clavulanate]  Family History  Problem Relation Age of Onset  . Diabetes Mother   . Hypertension Mother   . Hypertension Father     Social History Social History  Substance Use Topics  . Smoking status: Current Every Day Smoker    Packs/day: 0.50     Years: 20.00    Types: Cigarettes  . Smokeless tobacco: Never Used  . Alcohol use No    Review of Systems Constitutional: No fever/chills Eyes: No visual changes. ENT: No sore throat. Cardiovascular: Denies chest pain. Respiratory: Denies shortness of breath. Gastrointestinal: No abdominal pain.  No nausea, no vomiting.  No diarrhea.  No constipation. Genitourinary: Negative for dysuria. Musculoskeletal: Negative for neck pain.  Negative for back pain. Integumentary: Negative for rash. Neurological: Negative for headaches, focal weakness or numbness.   ____________________________________________   PHYSICAL EXAM:  VITAL SIGNS: ED Triage Vitals  Enc Vitals Group     BP 07/01/16 0051 (!) 148/94     Pulse Rate 07/01/16 0051 89     Resp 07/01/16 0051 13     Temp 07/01/16 0051 98.8 F (37.1 C)     Temp Source 07/01/16 0051 Oral     SpO2 07/01/16 0051 98 %     Weight 07/01/16 0050 127 kg (280 lb)     Height 07/01/16 0050 1.676 m (5\' 6" )     Head Circumference --      Peak Flow --      Pain Score --      Pain Loc --      Pain Edu? --      Excl. in [place]? --     Constitutional: Alert and oriented. Well appearing and in no acute distress. Eyes: Conjunctivae are normal. Head: Atraumatic. Mouth/Throat: Mucous membranes are moist.  Oropharynx non-erythematous. Neck: No stridor.   Cardiovascular: Normal rate, regular rhythm. Good peripheral circulation. Grossly normal heart sounds. Respiratory: Normal respiratory effort.  No retractions. Lungs CTAB. Gastrointestinal: Soft and nontender. No distention.  Musculoskeletal: No lower extremity tenderness nor edema. No gross deformities of extremities. Neurologic:  Normal speech and language. No gross focal neurologic deficits are appreciated.  Skin:  Skin is warm, dry and intact. No rash noted. Psychiatric: Mood and affect are normal. Speech and behavior are normal.  ____________________________________________   LABS (all labs  ordered are listed, but only abnormal results are displayed)  Labs Reviewed  CBC - Abnormal; Notable for the following:       Result Value   WBC 11.9 (*)    RDW 15.0 (*)    All other components within normal limits  BASIC METABOLIC PANEL - Abnormal; Notable for the following:    Glucose, Bld 246 (*)    All other components within normal limits  TROPONIN I  TROPONIN I   ____________________________________________  EKG  ED ECG REPORT I, [place] N Nathanael Krist, the attending physician, personally viewed and interpreted this ECG.   Date: 07/01/2016  EKG Time: 12:52 AM  Rate: 88  Rhythm: Normal sinus rhythm  Axis: Normal  Intervals: Normal  ST&T Change: None  ____________________________________________  RADIOLOGY I, [place] N Devona Holmes, personally viewed and evaluated these images (plain radiographs) as part of my medical decision making, as well  as reviewing the written report by the radiologist. CLINICAL DATA:  51 year old female with acute onset chest pain.  EXAM: PORTABLE CHEST 1 VIEW  COMPARISON:  Chest radiograph dated 03/30/2014  FINDINGS: The lungs are clear. There is no pleural effusion or pneumothorax. The cardiac silhouette is within normal limits. Lower cervical fixation plate and screws. No acute osseous pathology.  IMPRESSION: No active disease.   Electronically Signed   By: Anner Crete M.D.   On: 07/01/2016 01:15     Procedures   ____________________________________________   INITIAL IMPRESSION / ASSESSMENT AND PLAN / ED COURSE  Pertinent labs & imaging results that were available during my care of the patient were reviewed by me and considered in my medical decision making (see chart for details).   51 year old female presenting with above-mentioned symptoms. Troponin negative 2 EKG revealed no evidence of ischemia or infarction. Chest x-ray normal patient a symptomatically at this time      ____________________________________________  FINAL CLINICAL IMPRESSION(S) / ED DIAGNOSES  Final diagnoses:  Chest pain, unspecified type  COPD with acute exacerbation ([place])     MEDICATIONS GIVEN DURING THIS VISIT:  Medications  oxyCODONE-acetaminophen (PERCOCET/ROXICET) 5-325 MG per tablet 1 tablet (1 tablet Oral Given 07/01/16 0342)     NEW OUTPATIENT MEDICATIONS STARTED DURING THIS VISIT:  Discharge Medication List as of 07/01/2016  6:21 AM      Discharge Medication List as of 07/01/2016  6:21 AM      Discharge Medication List as of 07/01/2016  6:21 AM       Note:  This document was prepared using Dragon voice recognition software and may include unintentional dictation errors.    Gregor Hams, MD 07/04/16 [phone]

## 2016-08-21 ENCOUNTER — Ambulatory Visit: Payer: Medicare Other | Admitting: Physical Therapy

## 2016-08-26 ENCOUNTER — Ambulatory Visit: Payer: Medicare Other | Admitting: Physical Therapy

## 2016-08-28 ENCOUNTER — Ambulatory Visit: Payer: Medicare Other | Admitting: Physical Therapy

## 2016-09-04 ENCOUNTER — Ambulatory Visit: Payer: Medicare Other | Admitting: Physical Therapy

## 2016-09-09 ENCOUNTER — Encounter: Payer: Medicare Other | Admitting: Physical Therapy

## 2016-09-11 ENCOUNTER — Encounter: Payer: Medicare Other | Admitting: Physical Therapy

## 2017-03-26 ENCOUNTER — Other Ambulatory Visit: Payer: Self-pay | Admitting: Family Medicine

## 2017-03-26 DIAGNOSIS — R05 Cough: Secondary | ICD-10-CM

## 2017-03-26 DIAGNOSIS — R059 Cough, unspecified: Secondary | ICD-10-CM

## 2017-04-07 ENCOUNTER — Other Ambulatory Visit (HOSPITAL_COMMUNITY): Payer: Medicare Other

## 2017-04-07 ENCOUNTER — Observation Stay (HOSPITAL_COMMUNITY): Payer: Medicare Other

## 2017-04-07 ENCOUNTER — Observation Stay (HOSPITAL_COMMUNITY)
Admission: EM | Admit: 2017-04-07 | Discharge: 2017-04-08 | Disposition: A | Discharge status: Home / Self Care | Payer: Medicare Other | Attending: Internal Medicine | Admitting: Internal Medicine

## 2017-04-07 ENCOUNTER — Emergency Department (HOSPITAL_COMMUNITY): Payer: Medicare Other

## 2017-04-07 ENCOUNTER — Encounter (HOSPITAL_COMMUNITY): Payer: Self-pay | Admitting: Emergency Medicine

## 2017-04-07 ENCOUNTER — Other Ambulatory Visit: Payer: Self-pay

## 2017-04-07 DIAGNOSIS — I639 Cerebral infarction, unspecified: Secondary | ICD-10-CM | POA: Diagnosis present

## 2017-04-07 DIAGNOSIS — G459 Transient cerebral ischemic attack, unspecified: Principal | ICD-10-CM | POA: Diagnosis present

## 2017-04-07 DIAGNOSIS — E1159 Type 2 diabetes mellitus with other circulatory complications: Secondary | ICD-10-CM

## 2017-04-07 DIAGNOSIS — J449 Chronic obstructive pulmonary disease, unspecified: Secondary | ICD-10-CM | POA: Diagnosis not present

## 2017-04-07 DIAGNOSIS — R200 Anesthesia of skin: Secondary | ICD-10-CM

## 2017-04-07 DIAGNOSIS — F32A Depression, unspecified: Secondary | ICD-10-CM

## 2017-04-07 DIAGNOSIS — I6930 Unspecified sequelae of cerebral infarction: Secondary | ICD-10-CM

## 2017-04-07 DIAGNOSIS — Z7902 Long term (current) use of antithrombotics/antiplatelets: Secondary | ICD-10-CM | POA: Diagnosis not present

## 2017-04-07 DIAGNOSIS — G4734 Idiopathic sleep related nonobstructive alveolar hypoventilation: Secondary | ICD-10-CM | POA: Diagnosis present

## 2017-04-07 DIAGNOSIS — E785 Hyperlipidemia, unspecified: Secondary | ICD-10-CM | POA: Diagnosis present

## 2017-04-07 DIAGNOSIS — Z96653 Presence of artificial knee joint, bilateral: Secondary | ICD-10-CM | POA: Insufficient documentation

## 2017-04-07 DIAGNOSIS — Z79899 Other long term (current) drug therapy: Secondary | ICD-10-CM | POA: Insufficient documentation

## 2017-04-07 DIAGNOSIS — G43109 Migraine with aura, not intractable, without status migrainosus: Secondary | ICD-10-CM

## 2017-04-07 DIAGNOSIS — G43809 Other migraine, not intractable, without status migrainosus: Secondary | ICD-10-CM | POA: Diagnosis not present

## 2017-04-07 DIAGNOSIS — F419 Anxiety disorder, unspecified: Secondary | ICD-10-CM | POA: Diagnosis not present

## 2017-04-07 DIAGNOSIS — E119 Type 2 diabetes mellitus without complications: Secondary | ICD-10-CM | POA: Diagnosis not present

## 2017-04-07 DIAGNOSIS — F17210 Nicotine dependence, cigarettes, uncomplicated: Secondary | ICD-10-CM | POA: Insufficient documentation

## 2017-04-07 DIAGNOSIS — Z720 Tobacco use: Secondary | ICD-10-CM | POA: Diagnosis present

## 2017-04-07 DIAGNOSIS — Z8505 Personal history of malignant neoplasm of liver: Secondary | ICD-10-CM | POA: Diagnosis not present

## 2017-04-07 DIAGNOSIS — G4733 Obstructive sleep apnea (adult) (pediatric): Secondary | ICD-10-CM | POA: Insufficient documentation

## 2017-04-07 DIAGNOSIS — E1169 Type 2 diabetes mellitus with other specified complication: Secondary | ICD-10-CM

## 2017-04-07 DIAGNOSIS — Z8673 Personal history of transient ischemic attack (TIA), and cerebral infarction without residual deficits: Secondary | ICD-10-CM | POA: Diagnosis not present

## 2017-04-07 DIAGNOSIS — G43509 Persistent migraine aura without cerebral infarction, not intractable, without status migrainosus: Secondary | ICD-10-CM

## 2017-04-07 LAB — URINALYSIS, COMPLETE (UACMP) WITH MICROSCOPIC
Bilirubin Urine: NEGATIVE
GLUCOSE, UA: NEGATIVE mg/dL
Hgb urine dipstick: NEGATIVE
Ketones, ur: NEGATIVE mg/dL
Leukocytes, UA: NEGATIVE
Nitrite: NEGATIVE
Protein, ur: NEGATIVE mg/dL
Specific Gravity, Urine: >1.046 — ABNORMAL HIGH (ref 1.005–1.030)
pH: 5 (ref 5.0–8.0)

## 2017-04-07 LAB — COMPREHENSIVE METABOLIC PANEL
ALBUMIN: 3.3 g/dL — AB (ref 3.5–5.0)
ALK PHOS: 64 U/L (ref 38–126)
ALT: 12 U/L — AB (ref 14–54)
AST: 16 U/L (ref 15–41)
Anion gap: 11 (ref 5–15)
BUN: 13 mg/dL (ref 6–20)
CALCIUM: 8.8 mg/dL — AB (ref 8.9–10.3)
CO2: 25 mmol/L (ref 22–32)
CREATININE: 0.89 mg/dL (ref 0.44–1.00)
Chloride: 100 mmol/L — ABNORMAL LOW (ref 101–111)
GFR calc Af Amer: >60 mL/min (ref >60)
GFR calc non Af Amer: >60 mL/min (ref >60)
GLUCOSE: 217 mg/dL — AB (ref 65–99)
Potassium: 4.1 mmol/L (ref 3.5–5.1)
SODIUM: 136 mmol/L (ref 135–145)
Total Bilirubin: 0.4 mg/dL (ref 0.3–1.2)
Total Protein: 7.1 g/dL (ref 6.5–8.1)

## 2017-04-07 LAB — RAPID URINE DRUG SCREEN, HOSP PERFORMED
Amphetamines: NOT DETECTED
BENZODIAZEPINES: POSITIVE — AB
Barbiturates: NOT DETECTED
Cocaine: NOT DETECTED
Opiates: POSITIVE — AB
TETRAHYDROCANNABINOL: POSITIVE — AB

## 2017-04-07 LAB — GLUCOSE, CAPILLARY
GLUCOSE-CAPILLARY: 176 mg/dL — AB (ref 65–99)
Glucose-Capillary: 181 mg/dL — ABNORMAL HIGH (ref 65–99)
Glucose-Capillary: 187 mg/dL — ABNORMAL HIGH (ref 65–99)
Glucose-Capillary: 165 mg/dL — ABNORMAL HIGH (ref 65–99)
Glucose-Capillary: 174 mg/dL — ABNORMAL HIGH (ref 65–99)

## 2017-04-07 LAB — I-STAT TROPONIN, ED: Troponin i, poc: 0 ng/mL (ref 0.00–0.08)

## 2017-04-07 LAB — DIFFERENTIAL
Basophils Absolute: 0 10*3/uL (ref 0.0–0.1)
Basophils Relative: 0 %
Eosinophils Absolute: 0.2 10*3/uL (ref 0.0–0.7)
Eosinophils Relative: 2 %
LYMPHS PCT: 41 %
Lymphs Abs: 4.5 10*3/uL — ABNORMAL HIGH (ref 0.7–4.0)
Monocytes Absolute: 0.7 10*3/uL (ref 0.1–1.0)
Monocytes Relative: 7 %
NEUTROS ABS: 5.6 10*3/uL (ref 1.7–7.7)
NEUTROS PCT: 50 %

## 2017-04-07 LAB — T4, FREE: FREE T4: 0.68 ng/dL (ref 0.61–1.12)

## 2017-04-07 LAB — TSH: TSH: 5.336 u[IU]/mL — ABNORMAL HIGH (ref 0.350–4.500)

## 2017-04-07 LAB — I-STAT CHEM 8, ED
BUN: 15 mg/dL (ref 6–20)
CHLORIDE: 99 mmol/L — AB (ref 101–111)
Calcium, Ion: 1.17 mmol/L (ref 1.15–1.40)
Creatinine, Ser: 0.9 mg/dL (ref 0.44–1.00)
GLUCOSE: 198 mg/dL — AB (ref 65–99)
HCT: 45 % (ref 36.0–46.0)
Hemoglobin: 15.3 g/dL — ABNORMAL HIGH (ref 12.0–15.0)
Potassium: 4.2 mmol/L (ref 3.5–5.1)
Sodium: 138 mmol/L (ref 135–145)
TCO2: 29 mmol/L (ref 22–32)

## 2017-04-07 LAB — I-STAT BETA HCG BLOOD, ED (MC, WL, AP ONLY): I-stat hCG, quantitative: <5 m[IU]/mL (ref <5)

## 2017-04-07 LAB — CBC
HCT: 44 % (ref 36.0–46.0)
HEMOGLOBIN: 14.3 g/dL (ref 12.0–15.0)
MCH: 28.7 pg (ref 26.0–34.0)
MCHC: 32.5 g/dL (ref 30.0–36.0)
MCV: 88.2 fL (ref 78.0–100.0)
PLATELETS: 301 10*3/uL (ref 150–400)
RBC: 4.99 MIL/uL (ref 3.87–5.11)
RDW: 14.3 % (ref 11.5–15.5)
WBC: 11.1 10*3/uL — AB (ref 4.0–10.5)

## 2017-04-07 LAB — LIPID PANEL
CHOL/HDL RATIO: 4.4 ratio
Cholesterol: 136 mg/dL (ref 0–200)
HDL: 31 mg/dL — ABNORMAL LOW (ref >40)
LDL CALC: 67 mg/dL (ref 0–99)
TRIGLYCERIDES: 191 mg/dL — AB (ref <150)
VLDL: 38 mg/dL (ref 0–40)

## 2017-04-07 LAB — VITAMIN B12: VITAMIN B 12: 252 pg/mL (ref 180–914)

## 2017-04-07 LAB — PROTIME-INR
INR: 0.94
PROTHROMBIN TIME: 12.5 s (ref 11.4–15.2)

## 2017-04-07 LAB — CBG MONITORING, ED: GLUCOSE-CAPILLARY: 216 mg/dL — AB (ref 65–99)

## 2017-04-07 LAB — APTT: aPTT: 32 seconds (ref 24–36)

## 2017-04-07 LAB — HEMOGLOBIN A1C
Hgb A1c MFr Bld: 8.4 % — ABNORMAL HIGH (ref 4.8–5.6)
Mean Plasma Glucose: 194.38 mg/dL

## 2017-04-07 LAB — RPR: RPR Ser Ql: NONREACTIVE

## 2017-04-07 MED ORDER — DICYCLOMINE HCL 20 MG PO TABS
20.0000 mg | ORAL_TABLET | Freq: Two times a day (BID) | ORAL | Status: DC
  Administered 2017-04-07 – 2017-04-08 (×3): 20 mg via ORAL
  Filled 2017-04-07 (×3): qty 1

## 2017-04-07 MED ORDER — COLESEVELAM HCL 625 MG PO TABS: 1875.0000 mg | ORAL_TABLET | Freq: Two times a day (BID) | ORAL | Status: DC

## 2017-04-07 MED ORDER — SIMVASTATIN 40 MG PO TABS
40.0000 mg | ORAL_TABLET | Freq: Every day | ORAL | Status: DC
  Administered 2017-04-07: 40 mg via ORAL
  Filled 2017-04-07: qty 1

## 2017-04-07 MED ORDER — SENNOSIDES-DOCUSATE SODIUM 8.6-50 MG PO TABS
1.0000 | ORAL_TABLET | Freq: Every evening | ORAL | Status: DC | PRN
  Administered 2017-04-08: 1 via ORAL
  Filled 2017-04-07: qty 1

## 2017-04-07 MED ORDER — ENOXAPARIN SODIUM 40 MG/0.4ML ~~LOC~~ SOLN
40.0000 mg | SUBCUTANEOUS | Status: DC
  Administered 2017-04-07 – 2017-04-08 (×2): 40 mg via SUBCUTANEOUS
  Filled 2017-04-07 (×2): qty 0.4

## 2017-04-07 MED ORDER — TOPIRAMATE 25 MG PO TABS
50.0000 mg | ORAL_TABLET | Freq: Two times a day (BID) | ORAL | Status: DC
  Administered 2017-04-07 – 2017-04-08 (×2): 50 mg via ORAL
  Filled 2017-04-07 (×2): qty 2

## 2017-04-07 MED ORDER — GABAPENTIN 600 MG PO TABS
600.0000 mg | ORAL_TABLET | Freq: Four times a day (QID) | ORAL | Status: DC
  Administered 2017-04-07 – 2017-04-08 (×7): 600 mg via ORAL
  Filled 2017-04-07 (×7): qty 1

## 2017-04-07 MED ORDER — ONDANSETRON HCL 4 MG/2ML IJ SOLN: 4.0000 mg | Freq: Four times a day (QID) | INTRAMUSCULAR | Status: DC | PRN

## 2017-04-07 MED ORDER — IOPAMIDOL (ISOVUE-370) INJECTION 76%
INTRAVENOUS | Status: AC
  Administered 2017-04-07: 80 mL
  Filled 2017-04-07: qty 100

## 2017-04-07 MED ORDER — OXYCODONE HCL 5 MG PO TABS
10.0000 mg | ORAL_TABLET | Freq: Four times a day (QID) | ORAL | Status: DC | PRN
  Administered 2017-04-07 – 2017-04-08 (×4): 10 mg via ORAL
  Filled 2017-04-07 (×4): qty 2

## 2017-04-07 MED ORDER — CLOPIDOGREL BISULFATE 75 MG PO TABS: 75.0000 mg | ORAL_TABLET | Freq: Every day | ORAL | Status: DC

## 2017-04-07 MED ORDER — BISACODYL 10 MG RE SUPP: 10.0000 mg | Freq: Every day | RECTAL | Status: DC | PRN

## 2017-04-07 MED ORDER — INSULIN DETEMIR 100 UNIT/ML ~~LOC~~ SOLN: 60.0000 [IU] | Freq: Every day | SUBCUTANEOUS | Status: DC

## 2017-04-07 MED ORDER — IOPAMIDOL (ISOVUE-370) INJECTION 76%
50.0000 mL | Freq: Once | INTRAVENOUS | Status: AC | PRN
  Administered 2017-04-07: 50 mL via INTRAVENOUS

## 2017-04-07 MED ORDER — ASPIRIN 300 MG RE SUPP: 300.0000 mg | Freq: Every day | RECTAL | Status: DC

## 2017-04-07 MED ORDER — CLOPIDOGREL BISULFATE 75 MG PO TABS
75.0000 mg | ORAL_TABLET | Freq: Every day | ORAL | Status: DC
  Administered 2017-04-07 – 2017-04-08 (×2): 75 mg via ORAL
  Filled 2017-04-07 (×2): qty 1

## 2017-04-07 MED ORDER — ACETAMINOPHEN 650 MG RE SUPP: 650.0000 mg | Freq: Four times a day (QID) | RECTAL | Status: DC | PRN

## 2017-04-07 MED ORDER — OXYCODONE HCL ER 15 MG PO T12A
30.0000 mg | EXTENDED_RELEASE_TABLET | Freq: Two times a day (BID) | ORAL | Status: DC
  Administered 2017-04-07 – 2017-04-08 (×3): 30 mg via ORAL
  Filled 2017-04-07 (×3): qty 2

## 2017-04-07 MED ORDER — PROCHLORPERAZINE EDISYLATE 5 MG/ML IJ SOLN
10.0000 mg | Freq: Once | INTRAMUSCULAR | Status: AC
  Administered 2017-04-07: 10 mg via INTRAVENOUS
  Filled 2017-04-07: qty 2

## 2017-04-07 MED ORDER — DIPHENHYDRAMINE HCL 50 MG/ML IJ SOLN
25.0000 mg | Freq: Once | INTRAMUSCULAR | Status: AC
  Administered 2017-04-07: 25 mg via INTRAVENOUS
  Filled 2017-04-07: qty 1

## 2017-04-07 MED ORDER — INSULIN ASPART 100 UNIT/ML ~~LOC~~ SOLN: 0.0000 [IU] | Freq: Three times a day (TID) | SUBCUTANEOUS | Status: DC

## 2017-04-07 MED ORDER — CITALOPRAM HYDROBROMIDE 10 MG PO TABS: 40.0000 mg | ORAL_TABLET | Freq: Every day | ORAL | Status: DC

## 2017-04-07 MED ORDER — ASPIRIN EC 325 MG PO TBEC
325.0000 mg | DELAYED_RELEASE_TABLET | Freq: Every day | ORAL | Status: DC
  Administered 2017-04-07: 325 mg via ORAL
  Filled 2017-04-07: qty 1

## 2017-04-07 MED ORDER — LORAZEPAM 2 MG/ML IJ SOLN
1.0000 mg | Freq: Once | INTRAMUSCULAR | Status: AC
  Administered 2017-04-07: 1 mg via INTRAVENOUS
  Filled 2017-04-07: qty 1

## 2017-04-07 MED ORDER — STROKE: EARLY STAGES OF RECOVERY BOOK
Freq: Once | Status: AC
  Administered 2017-04-07: 1
  Filled 2017-04-07: qty 1

## 2017-04-07 MED ORDER — IOPAMIDOL (ISOVUE-370) INJECTION 76%
INTRAVENOUS | Status: AC
  Filled 2017-04-07: qty 50

## 2017-04-07 MED ORDER — ALPRAZOLAM 0.25 MG PO TABS
1.0000 mg | ORAL_TABLET | Freq: Three times a day (TID) | ORAL | Status: DC | PRN
  Administered 2017-04-07 – 2017-04-08 (×2): 1 mg via ORAL
  Filled 2017-04-07 (×2): qty 4

## 2017-04-07 MED ORDER — SIMVASTATIN 40 MG PO TABS: 40.0000 mg | ORAL_TABLET | Freq: Every evening | ORAL | Status: DC

## 2017-04-07 MED ORDER — NICOTINE 21 MG/24HR TD PT24
21.0000 mg | MEDICATED_PATCH | Freq: Every day | TRANSDERMAL | Status: DC
  Administered 2017-04-07 – 2017-04-08 (×2): 21 mg via TRANSDERMAL
  Filled 2017-04-07 (×2): qty 1

## 2017-04-07 MED ORDER — ACETAMINOPHEN 325 MG PO TABS
650.0000 mg | ORAL_TABLET | Freq: Four times a day (QID) | ORAL | Status: DC | PRN
  Administered 2017-04-07 – 2017-04-08 (×3): 650 mg via ORAL
  Filled 2017-04-07 (×3): qty 2

## 2017-04-07 MED ORDER — ATORVASTATIN CALCIUM 80 MG PO TABS: 80.0000 mg | ORAL_TABLET | Freq: Every day | ORAL | Status: DC

## 2017-04-07 MED ORDER — GABAPENTIN 600 MG PO TABS: 600.0000 mg | ORAL_TABLET | Freq: Four times a day (QID) | ORAL | Status: DC

## 2017-04-07 MED ORDER — HYDRALAZINE HCL 20 MG/ML IJ SOLN: 5.0000 mg | Freq: Three times a day (TID) | INTRAMUSCULAR | Status: DC | PRN

## 2017-04-07 MED ORDER — IPRATROPIUM-ALBUTEROL 0.5-2.5 (3) MG/3ML IN SOLN: 3.0000 mL | RESPIRATORY_TRACT | Status: DC | PRN

## 2017-04-07 MED ORDER — ONDANSETRON HCL 4 MG PO TABS: 4.0000 mg | ORAL_TABLET | Freq: Four times a day (QID) | ORAL | Status: DC | PRN

## 2017-04-07 MED ORDER — INSULIN ASPART 100 UNIT/ML ~~LOC~~ SOLN
0.0000 [IU] | Freq: Three times a day (TID) | SUBCUTANEOUS | Status: DC
  Administered 2017-04-07 (×2): 4 [IU] via SUBCUTANEOUS
  Administered 2017-04-08: 7 [IU] via SUBCUTANEOUS
  Administered 2017-04-08: 4 [IU] via SUBCUTANEOUS

## 2017-04-07 NOTE — ED Notes (Signed)
Cheral Marker, MD cancelled code stroke, changed to TIA alert. H[place], MD notified.

## 2017-04-07 NOTE — Evaluation (Signed)
Physical Therapy Evaluation Patient Details Name: [name] MRN: [id number] DOB: December 23, 1965 Today's Date: 04/07/2017   History of Present Illness  Patient is a 52 y/o female who presents with HA and right numbness/weakness. NIH:3. Head CT and MRI-unremarkable. PMH includes CVA with residual dysarthria, decreased sensation RUE/LE, DM, COPD.   Clinical Impression  Patient presents with headache, visual deficits, residual right sided weakness/numbness, impaired balance and impaired mobility s/p above. Tolerated gait training with Min guard-Min A for balance/safety. Pt with right ankle instability limiting safe mobility. Reports being ambulatory only household distances and uses w/c for community distances. Limited today secondary to worsened HA. Will follow acutely to maximize independence and mobility prior to return home.     Follow Up Recommendations Home health PT;Supervision - Intermittent    Equipment Recommendations  None recommended by PT    Recommendations for Other Services       Precautions / Restrictions Precautions Precautions: Fall Precaution Comments: Right ankle instability Restrictions Weight Bearing Restrictions: No      Mobility  Bed Mobility Overal bed mobility: Needs Assistance Bed Mobility: Supine to Sit;Sit to Supine     Supine to sit: Modified independent (Device/Increase time);HOB elevated Sit to supine: Modified independent (Device/Increase time);HOB elevated   General bed mobility comments: No assist needed.  Transfers Overall transfer level: Needs assistance Equipment used: None Transfers: Sit to/from Stand Sit to Stand: Min guard         General transfer comment: Min guard for safety. Stood from [employer], holding into rail for support.   Ambulation/Gait Ambulation/Gait assistance: Min guard;Min assist Ambulation Distance (Feet): 35 Feet Assistive device: Rolling walker (2 wheeled) Gait Pattern/deviations: Step-through pattern;Decreased  dorsiflexion - right;Trunk flexed;Decreased weight shift to right Gait velocity: decreased Gait velocity interpretation: Below normal speed for age/gender General Gait Details: Slow, unsteady gait with right ankle instability and foot drop- premorbid. Increased WB through [place].  Stairs            Wheelchair Mobility    Modified Rankin (Stroke Patients Only) Modified Rankin (Stroke Patients Only) Pre-Morbid Rankin Score: Moderate disability Modified Rankin: Moderate disability     Balance Overall balance assessment: Needs assistance Sitting-balance support: Feet supported;No upper extremity supported Sitting balance-Leahy Scale: Good     Standing balance support: During functional activity Standing balance-Leahy Scale: Fair Standing balance comment: Able to stand but requires at least 1 UE support.                              Pertinent Vitals/Pain Pain Assessment: Faces Faces Pain Scale: Hurts even more Pain Location: headache Pain Descriptors / Indicators: Headache Pain Intervention(s): Monitored during session;Repositioned;RN gave pain meds during session;Limited activity within patient's tolerance    Home Living Family/patient expects to be discharged to:: Private residence Living Arrangements: Spouse/significant other Available Help at Discharge: Family Type of Home: House Home Access: Level entry     Home Layout: One level Home Equipment: Environmental consultant - 2 wheels;Wheelchair - [employer];Shower seat;Cane - quad      Prior Function Level of Independence: Needs assistance   Gait / Transfers Assistance Needed: Walks short household distances, otherwise uses w/c for community ambulation.  ADL's / Homemaking Assistance Needed: Independent for ADLs. Spouse does all the cooking.        Hand Dominance   Dominant Hand: Right    Extremity/Trunk Assessment   Upper Extremity Assessment Upper Extremity Assessment: Defer to OT evaluation;RUE  deficits/detail RUE  Sensation: decreased light touch    Lower Extremity Assessment Lower Extremity Assessment: RLE deficits/detail RLE Deficits / Details: Grossly ~3+-4/5 throughout except 0/5 DF and eversion.  RLE Sensation: decreased light touch       Communication   Communication: No difficulties  Cognition Arousal/Alertness: Awake/alert Behavior During Therapy: WFL for tasks assessed/performed Overall Cognitive Status: Within Functional Limits for tasks assessed                                 General Comments: for basic mobility tasks, not formally assessed.      General Comments      Exercises     Assessment/Plan    PT Assessment Patient needs continued PT services  PT Problem List Decreased strength;Decreased mobility;Impaired sensation;Decreased balance;Decreased activity tolerance       PT Treatment Interventions Functional mobility training;Balance training;Patient/family education;Gait training;Therapeutic activities;Neuromuscular re-education;Therapeutic exercise    PT Goals (Current goals can be found in the Care Plan section)  Acute Rehab PT Goals Patient Stated Goal: to get my ankle to stop rolling PT Goal Formulation: With patient Time For Goal Achievement: 04/21/17 Potential to Achieve Goals: Good    Frequency Min 3X/week   Barriers to discharge        Co-evaluation               AM-PAC PT "6 Clicks" Daily Activity  Outcome Measure Difficulty turning over in bed (including adjusting bedclothes, sheets and blankets)?: None Difficulty moving from lying on back to sitting on the side of the bed? : None Difficulty sitting down on and standing up from a chair with arms (e.g., wheelchair, bedside commode, etc,.)?: None Help needed moving to and from a bed to chair (including a wheelchair)?: A Little Help needed walking in hospital room?: A Little Help needed climbing 3-5 steps with a railing? : A Lot 6 Click Score: 20    End  of Session Equipment Utilized During Treatment: Gait belt Activity Tolerance: Patient limited by pain(HA) Patient left: in bed;with call bell/phone within reach;with bed alarm set Nurse Communication: Mobility status PT Visit Diagnosis: Hemiplegia and hemiparesis;Difficulty in walking, not elsewhere classified (R26.2);Other abnormalities of gait and mobility (R26.89) Hemiplegia - Right/Left: Right Hemiplegia - dominant/non-dominant: Dominant    Time: 1341-1402 PT Time Calculation (min) (ACUTE ONLY): 21 min   Charges:   PT Evaluation $PT Eval Low Complexity: 1 Low     PT G CodesWray Kearns, PT, DPT [phone]    [name] 04/07/2017, 3:17 PM

## 2017-04-07 NOTE — ED Notes (Signed)
Per MRI, transporters coming for pt in 5 minutes.

## 2017-04-07 NOTE — Plan of Care (Signed)
  Problem: Education: Goal: Knowledge of General Education information will improve Outcome: Progressing   Problem: Health Behavior/Discharge Planning: Goal: Ability to manage health-related needs will improve Outcome: Progressing   Problem: Clinical Measurements: Goal: Ability to maintain clinical measurements within normal limits will improve Outcome: Progressing Goal: Will remain free from infection Outcome: Progressing Goal: Diagnostic test results will improve Outcome: Progressing Goal: Respiratory complications will improve Outcome: Progressing Goal: Cardiovascular complication will be avoided Outcome: Progressing   Problem: Activity: Goal: Risk for activity intolerance will decrease Outcome: Progressing   Problem: Nutrition: Goal: Adequate nutrition will be maintained Outcome: Progressing   Problem: Coping: Goal: Level of anxiety will decrease Outcome: Progressing   Problem: Elimination: Goal: Will not experience complications related to bowel motility Outcome: Progressing Goal: Will not experience complications related to urinary retention Outcome: Progressing   Problem: Pain Managment: Goal: General experience of comfort will improve Outcome: Progressing   Problem: Safety: Goal: Ability to remain free from injury will improve Outcome: Progressing   Problem: Skin Integrity: Goal: Risk for impaired skin integrity will decrease Outcome: Progressing   Problem: Education: Goal: Knowledge of disease or condition will improve Outcome: Progressing Goal: Knowledge of secondary prevention will improve Outcome: Progressing

## 2017-04-07 NOTE — ED Triage Notes (Signed)
Pt reports sudden onset of R sided numbness 2100. Pt also reports R sided weakness but does have hx of stroke and states she has some weakness from this.

## 2017-04-07 NOTE — H&P (Addendum)
History and Physical    [name]:[id number] DOB: 1965-05-17 DOA: 04/07/2017   PCP: Patient, No Pcp Per   Patient coming from:  Home    Chief Complaint: Right sided numbness  HPI: [name] is a 52 y.o. female with medical history significant for left basal ganglia-corona radiata lacunar infarction presenting with acute onset of worsening right sided weakness in a patient with prior right sided motor deficits.  Symptoms were present about 2 days ago, worse around 9:30 PM, for which she sought medical attention.  She also noted that there was tingling on the right side of her face, and blurred vision in the right eye.  She denies any speech difficulty, dysphagia, or other focal deficits.  Of note, the patient has a gait instability due to right lower extremity from her prior stroke 5 years ago.  She denies any chest pain or palpitations.  She does report a 2-day history of right-sided headache.  Of note, the patient does have a history of migraines, but she reports that this is different.  She denies any ataxia.  She denies any recent fever, chills, or neck stiffness.  She denies any recent infections.  She denies any shortness of breath or cough.  She continues to smoke about 1 pack of cigarettes a day.  No confusion is reported.  Patient denies any urine retention or incontinence.  Patient is compliant with her medications, she takes Plavix and aspirin on a daily basis.  She denies any alcohol or recreational drug use.  Was consulted by neurology, Dr. Cheral Marker.  No TPA was given, due to symptoms being in the same distribution as the deficits from a prior stroke.  She is NIH SS 3.   ED Course:  BP 132/79   Pulse 77   Temp 98.4 F (36.9 C)   Resp 19   Ht 5\' 6"  (1.676 m)   Wt 136.1 kg (300 lb)   SpO2 96%   BMI 48.42 kg/m   CT of the head without acute abnormalities.  Stable chronic left basal ganglia and corona radiata lacunar infarction noted. MRI of the brain no acute findings,  chronic small vessel ischemia, remote left corona radiata perforator infarct CT angios of the head and neck are pending Glucose 198 Creatinine 0.9 Troponin 0.0 PT 12.5, INR 0.94, PTT 32. White count 11.1 Hemoglobin 15.3 Platelets 301 Last 2D echo in 2014 showed EF 67-20%, normal systolic Other labs were ordered during the night by EDP, including TSH, RPR, urine drug screen, lipid panel, HIV, hemoglobin A 1C, B12, which are pending.  Review of Systems:  As per HPI otherwise all other systems reviewed and are negative  Past Medical History:  Diagnosis Date  . Anxiety   . Cancer ([place])    a spot on liver and treated   . Complication of anesthesia    restless,easily upset  . COPD (chronic obstructive pulmonary disease) ([place])   . Diabetes mellitus without complication ([place])   . Headache(784.0)   . Restless   . Sleep apnea    neg test  . Stroke [facility])     Past Surgical History:  Procedure Laterality Date  . ABDOMINAL HYSTERECTOMY    . ANTERIOR CERVICAL DECOMP/DISCECTOMY FUSION N/A 03/11/2012   Procedure: ANTERIOR CERVICAL DECOMPRESSION/DISCECTOMY FUSION 1 LEVEL;  Surgeon: Ophelia Charter, MD;  Location: [facility];  Service: Neurosurgery;  Laterality: N/A;  Cervical five-six anterior cervical decompression with fusion interbody prothesis plating and bonegraft  . BACK SURGERY    .  EYE SURGERY    . HERNIA REPAIR    . JOINT REPLACEMENT     bil knees    Social History Social History   Socioeconomic History  . Marital status: Divorced    Spouse name: Not on file  . Number of children: Not on file  . Years of education: Not on file  . Highest education level: Not on file  Occupational History  . Not on file  Social Needs  . Financial resource strain: Not on file  . Food insecurity:    Worry: Not on file    Inability: Not on file  . Transportation needs:    Medical: Not on file    Non-medical: Not on file  Tobacco Use  . Smoking status: Current Every Day Smoker     Packs/day: 0.50    Years: 20.00    Pack years: 10.00    Types: Cigarettes  . Smokeless tobacco: Never Used  Substance and Sexual Activity  . Alcohol use: No  . Drug use: Yes    Types: Marijuana  . Sexual activity: Not on file  Lifestyle  . Physical activity:    Days per week: Not on file    Minutes per session: Not on file  . Stress: Not on file  Relationships  . Social connections:    Talks on phone: Not on file    Gets together: Not on file    Attends religious service: Not on file    Active member of club or organization: Not on file    Attends meetings of clubs or organizations: Not on file    Relationship status: Not on file  . Intimate partner violence:    Fear of current or ex partner: Not on file    Emotionally abused: Not on file    Physically abused: Not on file    Forced sexual activity: Not on file  Other Topics Concern  . Not on file  Social History Narrative  . Not on file     Allergies  Allergen Reactions  . Tramadol Nausea And Vomiting and Other (See Comments)    Increase blood pressure  . Augmentin [Amoxicillin-Pot Clavulanate] Rash    Family History  Problem Relation Age of Onset  . Diabetes Mother   . Hypertension Mother   . Hypertension Father       Prior to Admission medications   Medication Sig Start Date End Date Taking? Authorizing Provider  ALPRAZolam Duanne Moron) 1 MG tablet Take 1 mg by mouth 3 (three) times daily as needed for anxiety.    Yes [provider]  BYDUREON BCISE 2 MG/0.85ML AUIJ Inject 2 mg into the skin once a week. On Thursday 03/03/17  Yes [provider]  citalopram (CELEXA) 40 MG tablet Take 40 mg by mouth daily. 03/23/17  Yes [provider]  clopidogrel (PLAVIX) 75 MG tablet Take 75 mg by mouth daily. 03/18/17  Yes [provider]  dicyclomine (BENTYL) 20 MG tablet Take 20 mg by mouth 2 (two) times daily. 03/06/17  Yes [provider]  gabapentin (NEURONTIN) 600 MG tablet Take  600 mg by mouth 4 (four) times daily.   Yes [provider]  linagliptin (TRADJENTA) 5 MG TABS tablet Take 5 mg by mouth daily. 07/08/13  Yes [provider]  MOVANTIK 25 MG TABS tablet Take 25 mg by mouth daily. 03/03/17  Yes [provider]  Oxycodone HCl 10 MG TABS Take 10 mg by mouth 4 (four) times daily. 03/15/17  Yes [provider]  OXYCONTIN 30 MG 12 hr tablet Take 30 mg by mouth 2 (two) times daily. 03/15/17  Yes [provider]  pioglitazone (ACTOS) 45 MG tablet Take 45 mg by mouth daily.   Yes [provider]  simvastatin (ZOCOR) 40 MG tablet Take 1 tablet (40 mg total) by mouth every evening. 10/13/12  Yes Velvet Bathe, MD  Vitamin D, Ergocalciferol, (DRISDOL) 50000 units CAPS capsule Take 50,000 Units by mouth every 7 (seven) days. On Wednesday   Yes [provider]  [facility] 625 MG tablet Take 1,875 mg by mouth 2 (two) times daily.  03/04/17  Yes [provider]  aspirin 325 MG tablet Take 1 tablet (325 mg total) by mouth daily. Patient not taking: Reported on 04/07/2017 10/13/12   Velvet Bathe, MD  insulin detemir (LEVEMIR) 100 UNIT/ML injection Inject 60 Units into the skin daily.    [provider]  oxyCODONE-acetaminophen (PERCOCET/ROXICET) 5-325 MG per tablet Take 1 tablet by mouth every 6 (six) hours. Patient not taking: Reported on 04/07/2017 10/13/12   Velvet Bathe, MD    Physical Exam:  Vitals:   04/07/17 0600 04/07/17 0615 04/07/17 0630 04/07/17 0645  BP: 123/79 130/77 136/81 132/79  Pulse: 75 76 77 77  Resp: 20 19 19 19   Temp:      TempSrc:      SpO2: 96% 96% 95% 96%  Weight:      Height:       Constitutional: NAD, calm, comfortable Eyes: PERRL,  conjunctivae normal, very mild right ptosis ENMT: Mucous membranes are moist, without exudate or lesions  Neck: normal, supple, no masses, no thyromegaly Respiratory: clear to auscultation bilaterally, no wheezing, no crackles. Normal respiratory  effort  Cardiovascular: Regular rate and rhythm,  murmur, rubs or gallops. No extremity edema. 2+ pedal pulses. No carotid bruits.  Abdomen: Severely obese soft, non tender, No hepatosplenomegaly. Bowel sounds positive.  Musculoskeletal: no clubbing / cyanosis. Moves all extremities Skin: no jaundice, No lesions.  Multiple tattoos Neurologic: Sensation decreased on the right, strength 5+ on the left, 4+ on the right  psychiatric:   Alert and oriented x 3. Normal mood.     Labs on Admission: I have personally reviewed following labs and imaging studies  CBC: Recent Labs  Lab 04/07/17 0051 04/07/17 0101  WBC 11.1*  --   NEUTROABS 5.6  --   HGB 14.3 15.3*  HCT 44.0 45.0  MCV 88.2  --   PLT 301  --     Basic Metabolic Panel: Recent Labs  Lab 04/07/17 0051 04/07/17 0101  NA 136 138  K 4.1 4.2  CL 100* 99*  CO2 25  --   GLUCOSE 217* 198*  BUN 13 15  CREATININE 0.89 0.90  CALCIUM 8.8*  --     GFR: Estimated Creatinine Clearance: 102.7 mL/min (by C-G formula based on SCr of 0.9 mg/dL).  Liver Function Tests: Recent Labs  Lab 04/07/17 0051  AST 16  ALT 12*  ALKPHOS 64  BILITOT 0.4  PROT 7.1  ALBUMIN 3.3*   No results for input(s): LIPASE, AMYLASE in the last 168 hours. No results for input(s): AMMONIA in the last 168 hours.  Coagulation Profile: Recent Labs  Lab 04/07/17 0051  INR 0.94    Cardiac Enzymes: No results for input(s): CKTOTAL, CKMB, CKMBINDEX, TROPONINI in the last 168 hours.  BNP (last 3 results) No results for input(s): PROBNP in the last 8760 hours.  HbA1C: No results for input(s):  HGBA1C in the last 72 hours.  CBG: Recent Labs  Lab 04/07/17 0041  GLUCAP 216*    Lipid Profile: No results for input(s): CHOL, HDL, LDLCALC, TRIG, CHOLHDL, LDLDIRECT in the last 72 hours.  Thyroid Function Tests: No results for input(s): TSH, T4TOTAL, FREET4, T3FREE, THYROIDAB in the last 72 hours.  Anemia Panel: No results for input(s):  VITAMINB12, FOLATE, FERRITIN, TIBC, IRON, RETICCTPCT in the last 72 hours.  Urine analysis:    Component Value Date/Time   COLORURINE Yellow 09/23/2013 0154   COLORURINE YELLOW 03/11/2012 1234   APPEARANCEUR Clear 09/23/2013 0154   LABSPEC 1.030 09/23/2013 0154   PHURINE 5.0 09/23/2013 0154   PHURINE 5.5 03/11/2012 1234   GLUCOSEU >=500 09/23/2013 0154   HGBUR Negative 09/23/2013 0154   HGBUR NEGATIVE 03/11/2012 1234   BILIRUBINUR Negative 09/23/2013 0154   KETONESUR Negative 09/23/2013 0154   KETONESUR 15 (A) 03/11/2012 1234   PROTEINUR Negative 09/23/2013 0154   PROTEINUR NEGATIVE 03/11/2012 1234   UROBILINOGEN 0.2 03/11/2012 1234   NITRITE Negative 09/23/2013 0154   NITRITE NEGATIVE 03/11/2012 1234   LEUKOCYTESUR Negative 09/23/2013 0154    Sepsis Labs: @LABRCNTIP (procalcitonin:4,lacticidven:4) )No results found for this or any previous visit (from the past 240 hour(s)).   Radiological Exams on Admission: Ct Head Code Stroke Wo Contrast  Result Date: 04/07/2017 CLINICAL DATA:  Code stroke. 52 year old female with right side numbness and weakness since 20/1 100 hours. EXAM: CT HEAD WITHOUT CONTRAST TECHNIQUE: Contiguous axial images were obtained from the base of the skull through the vertex without intravenous contrast. COMPARISON:  [facility] Head CTs without contrast 11/08/2012 and earlier. Brain MRI 10/07/2012. FINDINGS: Brain: Chronic lacunar infarct left corona radiata tracking to the posterior lentiform nuclei is stable since 2014. Gray-white matter differentiation elsewhere is stable and within normal limits. No midline shift, ventriculomegaly, mass effect, evidence of mass lesion, intracranial hemorrhage or evidence of cortically based acute infarction. No cortical encephalomalacia identified. Vascular: Mild Calcified atherosclerosis at the skull base. No suspicious intracranial vascular hyperdensity. Skull: Stable and negative. Sinuses/Orbits:  Visualized paranasal sinuses and mastoids are stable and well pneumatized. Other: Visualized orbits and scalp soft tissues are within normal limits. ASPECTS [facility] Stroke Program Early CT Score) 7 - Ganglionic level infarction (caudate, lentiform nuclei, internal capsule, insula, M1-M3 cortex): - Supraganglionic infarction (M4-M6 cortex): 3 Total score (0-10 with 10 being normal): 10 IMPRESSION: 1. Chronic small vessel disease in the left corona radiata and basal ganglia appears stable since 2014. 2. Stable and normal noncontrast CT appearance of the brain elsewhere. 3. ASPECTS is 10. 4. These results were communicated to Dr. Cheral Marker at [facility] 3/26/2019by text page via the [facility] messaging system. Electronically Signed   By: Genevie Ann M.D.   On: 04/07/2017 01:06    EKG: Independently reviewed.  Assessment/Plan Active Problems:   TIA (transient ischemic attack)   Cerebral infarction ([place])   Brain TIA   Hyperlipidemia   Sleep apnea   Diabetes ([place])   Tobacco abuse   COPD (chronic obstructive pulmonary disease) ([facility])   Anxiety    Acute on chronic R sided numbness in a patient with a History of stroke 5 years ago. LKN 9 pm  CT of the head without acute abnormalities.  Stable chronic left basal ganglia and corona radiata lacunar infarction noted. MRI of the brain no acute findings, chronic small vessel ischemia, remote left corona radiata perforator infarct CT angio of the head and neck are pending No TPA was given, due  to symptoms being in the same distribution as the deficits from a prior stroke.  She is NIH SS 3.   Admit to Tele Obs  Stroke order set  Follow CTA head and neck results  Allow permissive HTN  HH/Carb modified Echo  PT/OT/SLP  lipid panel A1C Aspirin  Lipitor 80 mg daily Neuro  Following, appreciate their involvement Other labs were ordered during the night by EDP, including TSH, RPR, urine drug screen, lipid panel, HIV, hemoglobin A 1C, B12, which are pending.  Type II  Diabetes with Neuropathy current blood sugar level is 198 Lab Results  Component Value Date   HGBA1C 10.5 (H) 10/12/2012  Hgb A1C Hold home oral diabetic medications.  SSI Continue Neurontin    Hypertension BP 119/71   Pulse 82    Patient was not on  home anti-hypertensive medications  Continue permissive HTN for now, and Hydralazine as above, for BP 210/110 Will need oral meds upon discharge   Hyperlipidemia Continue home statins Await lipid panel   Anxiety Continue home Xanax  Tobacco abuse with nicotine withdrawal   Nicotine patch   Counseled cessation  COPD without exacerbation Osats normal in RA   Continue nebs and O2 prn    OSA Continue CPAP nightly    DVT prophylaxis:  Lovenox Code Status:    Full  Family Communication:  Discussed with patient Disposition Plan: Expect patient to be discharged to home after condition improves Consults called:    Neuro, Dr. Cheral Marker Admission status: Tele Obs    Sharene Butters, PA-C [facility]   Amion text  [phone]   04/07/2017, 7:57 AM

## 2017-04-07 NOTE — Progress Notes (Addendum)
STROKE TEAM PROGRESS NOTE   SUBJECTIVE (INTERVAL HISTORY) No family is at the bedside.  She reports hx of HA x 2 days prior to worsening numbness and tingling on her L. She has a hx of migraines for which she takes ibuprofen and tylenol. She has never taken anything prophylactically and not sure she wants to. She wants to stop smoking. She finds it difficult to exercise. She has had OP sleep workup and has home O2 if she needs.   OBJECTIVE Vitals:   04/07/17 0630 04/07/17 0645 04/07/17 0825 04/07/17 1025  BP: 136/81 132/79 119/71 121/71  Pulse: 77 77 82 74  Resp: 19 19 16 18   Temp:   98.1 F (36.7 C) 98.4 F (36.9 C)  TempSrc:   Oral Oral  SpO2: 95% 96% 98% 96%  Weight:      Height:        CBC:  Recent Labs  Lab 04/07/17 0051 04/07/17 0101  WBC 11.1*  --   NEUTROABS 5.6  --   HGB 14.3 15.3*  HCT 44.0 45.0  MCV 88.2  --   PLT 301  --     Basic Metabolic Panel:  Recent Labs  Lab 04/07/17 0051 04/07/17 0101  NA 136 138  K 4.1 4.2  CL 100* 99*  CO2 25  --   GLUCOSE 217* 198*  BUN 13 15  CREATININE 0.89 0.90  CALCIUM 8.8*  --     Lipid Panel:     Component Value Date/Time   CHOL 136 04/07/2017 0828   CHOL 207 (H) 10/07/2012 0423   TRIG 191 (H) 04/07/2017 0828   TRIG 273 (H) 10/07/2012 0423   HDL 31 (L) 04/07/2017 0828   HDL 25 (L) 10/07/2012 0423   CHOLHDL 4.4 04/07/2017 0828   VLDL 38 04/07/2017 0828   VLDL 55 (H) 10/07/2012 0423   LDLCALC 67 04/07/2017 0828   LDLCALC 127 (H) 10/07/2012 0423   HgbA1c:  Lab Results  Component Value Date   HGBA1C 8.4 (H) 04/07/2017   Urine Drug Screen:     Component Value Date/Time   LABOPIA POSITIVE (A) 10/12/2012 0731   COCAINSCRNUR NONE DETECTED 10/12/2012 0731   LABBENZ NONE DETECTED 10/12/2012 0731   AMPHETMU NONE DETECTED 10/12/2012 0731   THCU POSITIVE (A) 10/12/2012 0731   LABBARB NONE DETECTED 10/12/2012 0731    Alcohol Level No results found for: ETH  IMAGING  Ct Angio Head W Or Wo  Contrast  Result Date: 04/07/2017 CLINICAL DATA:  Code stroke for right facial tingling and weakness EXAM: CT ANGIOGRAPHY HEAD AND NECK TECHNIQUE: Multidetector CT imaging of the head and neck was performed using the standard protocol during bolus administration of intravenous contrast. Multiplanar CT image reconstructions and MIPs were obtained to evaluate the vascular anatomy. Carotid stenosis measurements (when applicable) are obtained utilizing NASCET criteria, using the distal internal carotid diameter as the denominator. CONTRAST:  80mL ISOVUE-370 IOPAMIDOL (ISOVUE-370) INJECTION 76% COMPARISON:  Head CT and brain MRI from earlier today FINDINGS: CTA NECK FINDINGS Aortic arch: Mild atherosclerotic plaque. Three vessel branching. No acute finding. Right carotid system: Mild atheromatous plaque mainly at the bifurcation. No stenosis, ulceration, or beading. Left carotid system: Mild atheromatous plaque at the bifurcation. No stenosis, ulceration, or beading. Vertebral arteries: Atheromatous plaque without flow limiting stenosis at the proximal left subclavian. Strong right vertebral artery dominance. Most left vertebral flow is to the left PICA. Vertebral arteries are smooth and diffusely patent Skeleton: No acute or aggressive finding. C5-6 ACDF  with solid arthrodesis Other neck: No acute finding. Optic drusen. Right parotid calcification. Upper chest: Pulmonary nodules seemingly randomly distributed small measuring up to 6 mm. Multiple pulmonary nodules were described on a 2017 chest CT at [facility]. None of the described nodules measure larger than those seen today. Central airway thickening in this smoker. Saber shape of the trachea. Review of the MIP images confirms the above findings CTA HEAD FINDINGS Anterior circulation: The right ICA is larger than the left in the setting of asymmetric larger right posterior communicating artery. Mild atheromatous plaque on the carotid siphons. No branch occlusion, flow  limiting stenosis, or beading. Negative for aneurysm. Posterior circulation: Right vertebral artery dominance. Fetal type right PCA. Limited visualization of the PCAs due to venous contamination. There is likely mild to moderate atheromatous narrowing of the distal right P2 segment. Venous sinuses: Patent Anatomic variants: As above Delayed phase: No abnormal intracranial enhancement. Review of the MIP images confirms the above findings IMPRESSION: 1. Overall mild atherosclerosis in the head and neck without acute finding or flow limiting stenosis. 2. Multiple bilateral subcentimeter pulmonary nodules. Multiple pulmonary nodules and recommended follow-up chest CT were described on a [facility] chest CT report June 2017. Recommend PCP follow-up to ensure appropriate imaging has been obtained. Electronically Signed   By: Monte Fantasia M.D.   On: 04/07/2017 10:04   Ct Angio Neck W Or Wo Contrast  Result Date: 04/07/2017 CLINICAL DATA:  Code stroke for right facial tingling and weakness EXAM: CT ANGIOGRAPHY HEAD AND NECK TECHNIQUE: Multidetector CT imaging of the head and neck was performed using the standard protocol during bolus administration of intravenous contrast. Multiplanar CT image reconstructions and MIPs were obtained to evaluate the vascular anatomy. Carotid stenosis measurements (when applicable) are obtained utilizing NASCET criteria, using the distal internal carotid diameter as the denominator. CONTRAST:  16mL ISOVUE-370 IOPAMIDOL (ISOVUE-370) INJECTION 76% COMPARISON:  Head CT and brain MRI from earlier today FINDINGS: CTA NECK FINDINGS Aortic arch: Mild atherosclerotic plaque. Three vessel branching. No acute finding. Right carotid system: Mild atheromatous plaque mainly at the bifurcation. No stenosis, ulceration, or beading. Left carotid system: Mild atheromatous plaque at the bifurcation. No stenosis, ulceration, or beading. Vertebral arteries: Atheromatous plaque without flow limiting stenosis at  the proximal left subclavian. Strong right vertebral artery dominance. Most left vertebral flow is to the left PICA. Vertebral arteries are smooth and diffusely patent Skeleton: No acute or aggressive finding. C5-6 ACDF with solid arthrodesis Other neck: No acute finding. Optic drusen. Right parotid calcification. Upper chest: Pulmonary nodules seemingly randomly distributed small measuring up to 6 mm. Multiple pulmonary nodules were described on a 2017 chest CT at [facility]. None of the described nodules measure larger than those seen today. Central airway thickening in this smoker. Saber shape of the trachea. Review of the MIP images confirms the above findings CTA HEAD FINDINGS Anterior circulation: The right ICA is larger than the left in the setting of asymmetric larger right posterior communicating artery. Mild atheromatous plaque on the carotid siphons. No branch occlusion, flow limiting stenosis, or beading. Negative for aneurysm. Posterior circulation: Right vertebral artery dominance. Fetal type right PCA. Limited visualization of the PCAs due to venous contamination. There is likely mild to moderate atheromatous narrowing of the distal right P2 segment. Venous sinuses: Patent Anatomic variants: As above Delayed phase: No abnormal intracranial enhancement. Review of the MIP images confirms the above findings IMPRESSION: 1. Overall mild atherosclerosis in the head and neck without acute finding or flow limiting  stenosis. 2. Multiple bilateral subcentimeter pulmonary nodules. Multiple pulmonary nodules and recommended follow-up chest CT were described on a [facility] chest CT report June 2017. Recommend PCP follow-up to ensure appropriate imaging has been obtained. Electronically Signed   By: Monte Fantasia M.D.   On: 04/07/2017 10:04   Mr Brain Wo Contrast  Result Date: 04/07/2017 CLINICAL DATA:  TIA.  Acute onset of right facial paresthesia. EXAM: MRI HEAD WITHOUT CONTRAST TECHNIQUE: Multiplanar, multiecho pulse  sequences of the brain and surrounding structures were obtained without intravenous contrast. COMPARISON:  Head CT from earlier today.  Brain MRI 10/07/2012 FINDINGS: Brain: No acute infarction, hemorrhage, hydrocephalus, extra-axial collection or mass lesion. Remote perforator infarct in the left corona radiata posteriorly. Presumed mild chronic small vessel ischemic gliosis in the pons and cerebral white matter. Normal brain volume. Vascular: Normal flow voids. Skull and upper cervical spine: Normal marrow signal. Sinuses/Orbits: Negative. IMPRESSION: 1. No acute finding. 2. Chronic small vessel ischemia. Remote left corona radiata perforator infarct. Electronically Signed   By: Monte Fantasia M.D.   On: 04/07/2017 08:07   Ct Head Code Stroke Wo Contrast  Result Date: 04/07/2017 CLINICAL DATA:  Code stroke. 52 year old female with right side numbness and weakness since 20/1 100 hours. EXAM: CT HEAD WITHOUT CONTRAST TECHNIQUE: Contiguous axial images were obtained from the base of the skull through the vertex without intravenous contrast. COMPARISON:  [facility] Head CTs without contrast 11/08/2012 and earlier. Brain MRI 10/07/2012. FINDINGS: Brain: Chronic lacunar infarct left corona radiata tracking to the posterior lentiform nuclei is stable since 2014. Gray-white matter differentiation elsewhere is stable and within normal limits. No midline shift, ventriculomegaly, mass effect, evidence of mass lesion, intracranial hemorrhage or evidence of cortically based acute infarction. No cortical encephalomalacia identified. Vascular: Mild Calcified atherosclerosis at the skull base. No suspicious intracranial vascular hyperdensity. Skull: Stable and negative. Sinuses/Orbits: Visualized paranasal sinuses and mastoids are stable and well pneumatized. Other: Visualized orbits and scalp soft tissues are within normal limits. ASPECTS [facility] Stroke Program Early CT Score) 7 - Ganglionic level  infarction (caudate, lentiform nuclei, internal capsule, insula, M1-M3 cortex): - Supraganglionic infarction (M4-M6 cortex): 3 Total score (0-10 with 10 being normal): 10 IMPRESSION: 1. Chronic small vessel disease in the left corona radiata and basal ganglia appears stable since 2014. 2. Stable and normal noncontrast CT appearance of the brain elsewhere. 3. ASPECTS is 10. 4. These results were communicated to Dr. Cheral Marker at [facility] 3/26/2019by text page via the [facility] messaging system. Electronically Signed   By: Genevie Ann M.D.   On: 04/07/2017 01:06    PHYSICAL EXAM General: Morbidly obese HEENT: /AT Lungs: Respirations unlabored Ext: Warm and well perfused  Neurologic Examination: Mental Status: Intact to complex questions and commands Cranial Nerves: II:  Visual fields intact all 4 quadrants of each eye; no extinction to DSS. PERRL.  III,IV, VI: No ptosis. EOMI without nystagmus.   V,VII: Mildly decreased right NL fold at rest, with symmetric contraction bilaterally during smile and grimace. Decreased sensation R face VIII: hearing intact to voice IX,X: Palate rises symmetrically XI: Symmetric.  XII: midline tongue extension  Motor: Right :  Upper extremity   4+/5                                                Left:     Upper  extremity   5/5             Lower extremity   4+/5                                                Lower extremity   5/5 Normal tone throughout; no atrophy noted Sensory: Decreased temp and FT sensation RUE and RLE. No extinction to DSS.   Plantars: Right: Equivocal                                  Left: Equivocal Cerebellar: No ataxia disproportionate to weakness with FNF or HS on right - slowing noted.  Gait: Deferred   ASSESSMENT/PLAN [name] is a 52 y.o. female with history of stroke 5 yrs ago w/ resultant RLE weakness, dysarthria and sensory deficit, DB, OSA presenting with right facial paresthesia with sensory numbness, in conjunction with  worsening of her baseline RUE paresthesia, numbness and weakness and facial droop. She did not receive IV t-PA.   Complex Migraine, recrudescence of previous stroke symptoms, no stroke/TIA  Resultant  Subjective R facial numbness and tingling, R frontal/temporal HA  Code Stroke CT no acute stroke. Atrophy. Small vessel disease L CR and BG. Aspects 10.    MRI head no acute stroke. Small vessel disease. Old L CR infarct.  CTA head & neck mild atherosclerosis. Mult B subcentimeter pulm nodules.  2D Echo  pending   LDL 67  HgbA1c 8.4  Lovenox 40 mg sq daily for VTE prophylaxis  Fall precautions  Diet heart healthy/carb modified Room service appropriate? Yes; Fluid consistency: Thin  clopidogrel 75 mg daily prior to admission, now on aspirin 325 mg daily and clopidogrel 75 mg daily. No indication for dual antiplatelets. D/c home on plavix alone. Aspirin d/c'd  Patient counseled to be compliant with her antithrombotic medications  Given HA 2-3x week, started on topamax 50 bid   Ongoing aggressive stroke risk factor management  Therapy recommendations:  Assessment pending, no therapy needs identified  Disposition:  Return home   Hyperlipidemia  Home meds:  zocor 40, resumed in hospital  LDL 67, goal < 70  Continue statin at discharge  Diabetes type II  HgbA1c 8.4, goal < 7.0  Uncontrolled  Discussed better home control with patient  Other Stroke Risk Factors  Cigarette smoker 1.5 ppd. Patch one. Has access to free patches at home. Wants to quit, advised to stop smoking.  Denies ETOH use  UDS pending. Discussed risk of THC use (was positive during 2014 admission)  Morbid Obesity, Body mass index is 48.42 kg/m., recommend weight loss, diet and exercise as appropriate   Hx stroke/TIA  10/06/2012 L IC infarct d/t Small vessel disease.   obstructive sleep apnea. Has had formal testing. Has O2 for home use if needed.  Other Active Problems  COPD w/o  exacerbation  Hospital day # 0  Burnetta Sabin, NP [facility] [place] for Pager information 04/07/2017 2:01 PM   ATTENDING NOTE: I reviewed above note and agree with the assessment and plan. I have made any additions or clarifications directly to the above note. Pt was seen and examined.   52 year old female with history of diabetes, anxiety, COPD, cervical surgery C5-6 ACDF admitted for right-sided facial numbness and worsening  right upper extremity weakness and numbness with severe headache.  She had stroke in 09/2012 with right-sided weakness.  MRI showed left BG/CR infarct.  A1c 10.5 LDL 131.  Put on aspirin 325.   On this admission, patient stated that he had right facial numbness which was new, but also right-sided weakness worsened in the setting of an episode of severe headache.  She had a history of migraine headache with severe headache 2 times per months, photophobia, phonophobia, nausea vomiting and need to stay in a dark room lying down.  In addition, she has 3 days of mild headache every week.  She has been taking Tylenol, but no other preventive medication.  CT showed old left BG/CR infarct, MRI showed no acute infarct but old left BG/CR infarct.  CT head and neck no LVO but mild atherosclerosis.  LDL 67 and A1c 8.4.  UDS pending, 2D echo pending.  Patient current episode most likely due to complicated migraine versus recrudescence of old stroke in the setting of severe headache.  Patient does have migraine features, will need preventive medication due to frequency of headache episode.  She is obese, would recommend Topamax 50 mg twice daily.  Continue Plavix and Zocor for stroke prevention.  Stroke risk factor modification.  Neurology will sign off. Please call with questions. Pt will follow up with stroke clinic NP at [facility] in about 4 weeks. Thanks for the consult.   Rosalin Hawking, MD PhD Stroke Neurology 04/07/2017 4:03 PM   To contact Stroke Continuity provider,  please refer to [email or web address] After hours, contact General Neurology

## 2017-04-07 NOTE — ED Notes (Signed)
Patient transported to MRI 

## 2017-04-07 NOTE — ED Notes (Signed)
Nurse reported that pt.is in MRI AND WILL TRANSPORTED A ROOM UPSTAIRS

## 2017-04-07 NOTE — ED Notes (Signed)
Attempted to call report

## 2017-04-07 NOTE — Consult Note (Signed)
Referring Physician: Dr. Dina Rich    Chief Complaint: Right sided numbness.   HPI: [name] is an 52 y.o. female presenting with acute onset of right facial paresthesia with sensory numbness, in conjunction with worsening of her baseline RUE paresthesia, numbness and weakness. Symptom onset was 9 PM. Her husband also noted right facial droop, which prompted her to be seen in the ED. She denies vision changes, chewing/swallowing difficulty, confusion, difficulty with speech comprehension or new onset of ataxia.   At baseline she has an antalgic gait due to right lower extremity weakness from a prior stroke which occurred approximately 5 years ago. She also has mild dysarthria, RUE weakness and sensory disturbance at baseline from the prior stroke. She is on Plavix for secondary stroke prevention.   LSN: 9:00 PM tPA Given: No: New symptoms in the same distribution as deficits from prior stroke; relatively mild symptoms/signs with a risk/benefit profile not favoring tPA administration. Following discussion of risks/benefits, the patient endorsed that she felt that the risks of tPA administration outweighed potential benefits.   NIHSS: 3  Past Medical History:  Diagnosis Date  . Anxiety   . Cancer ([place])    a spot on liver and treated   . Complication of anesthesia    restless,easily upset  . COPD (chronic obstructive pulmonary disease) ([place])   . Diabetes mellitus without complication ([place])   . Headache(784.0)   . Restless   . Sleep apnea    neg test  . Stroke [facility])     Past Surgical History:  Procedure Laterality Date  . ABDOMINAL HYSTERECTOMY    . ANTERIOR CERVICAL DECOMP/DISCECTOMY FUSION N/A 03/11/2012   Procedure: ANTERIOR CERVICAL DECOMPRESSION/DISCECTOMY FUSION 1 LEVEL;  Surgeon: Ophelia Charter, MD;  Location: [place] [facility];  Service: Neurosurgery;  Laterality: N/A;  Cervical five-six anterior cervical decompression with fusion interbody prothesis plating and bonegraft  . BACK  SURGERY    . EYE SURGERY    . HERNIA REPAIR    . JOINT REPLACEMENT     bil knees    Family History  Problem Relation Age of Onset  . Diabetes Mother   . Hypertension Mother   . Hypertension Father    Social History:  reports that she has been smoking cigarettes.  She has a 10.00 pack-year smoking history. She has never used smokeless tobacco. She reports that she has current or past drug history. Drug: Marijuana. She reports that she does not drink alcohol.  Allergies:  Allergies  Allergen Reactions  . Tramadol Nausea And Vomiting and Other (See Comments)    Increase blood pressure  . Augmentin [Amoxicillin-Pot Clavulanate] Rash    Home Medications:   Per patient, she no longer takes aspirin  ROS: Positive for intermittent severe headache and neck pain. No CP or SOB. Has occasional abdominal discomfort.   Physical Examination: Blood pressure 122/77, pulse 76, temperature 98.4 F (36.9 C), resp. rate (!) 23, height 5' 6" (1.676 m), weight 136.1 kg (300 lb), SpO2 97 %.  General: Morbidly obese HEENT: [place]/AT Lungs: Respirations unlabored Ext: Warm and well perfused  Neurologic Examination: Mental Status: Intact to complex questions and commands Cranial Nerves: II:  Visual fields intact all 4 quadrants of each eye; no extinction to DSS. PERRL.  III,IV, VI: No ptosis. EOMI without nystagmus.   V,VII: Mildly decreased right NL fold, with symmetric contraction bilaterally during smile and grimace. Decreased temp sensation in V1-V3 on right.  VIII: hearing intact to voice IX,X: Palate rises symmetrically XI:  Symmetric.  XII: midline tongue extension  Motor: Right : Upper extremity   4+/5    Left:     Upper extremity   5/5  Lower extremity   4+/5    Lower extremity   5/5 Normal tone throughout; no atrophy noted Sensory: Decreased temp and FT sensation RUE and RLE. No extinction to DSS.   Deep Tendon Reflexes:  Right biceps and brachioradialis 3+ Left biceps and  brachioradialis 2+ Right patellar and achilles: 3+ Left patellar and achilles: 1+ Plantars: Right: Equivocal   Left: Equivocal Cerebellar: No ataxia disproportionate to weakness with FNF or HS on right - slowing noted.  Gait: Deferred   Results for orders placed or performed during the hospital encounter of 04/07/17 (from the past 48 hour(s))  CBG monitoring, ED     Status: Abnormal   Collection Time: 04/07/17 12:41 AM  Result Value Ref Range   Glucose-Capillary 216 (H) 65 - 99 mg/dL  Protime-INR     Status: None   Collection Time: 04/07/17 12:51 AM  Result Value Ref Range   Prothrombin Time 12.5 11.4 - 15.2 seconds   INR 0.94     Comment: Performed at [facility], [address] [place], [place] [address]  APTT     Status: None   Collection Time: 04/07/17 12:51 AM  Result Value Ref Range   aPTT 32 24 - 36 seconds    Comment: Performed at [facility], [address] [place], [place] [address]  CBC     Status: Abnormal   Collection Time: 04/07/17 12:51 AM  Result Value Ref Range   WBC 11.1 (H) 4.0 - 10.5 K/uL   RBC 4.99 3.87 - 5.11 MIL/uL   Hemoglobin 14.3 12.0 - 15.0 g/dL   HCT 44.0 36.0 - 46.0 %   MCV 88.2 78.0 - 100.0 fL   MCH 28.7 26.0 - 34.0 pg   MCHC 32.5 30.0 - 36.0 g/dL   RDW 14.3 11.5 - 15.5 %   Platelets 301 150 - 400 K/uL    Comment: Performed at [facility], [address] [place], [place] [address]  Differential     Status: Abnormal   Collection Time: 04/07/17 12:51 AM  Result Value Ref Range   Neutrophils Relative % 50 %   Neutro Abs 5.6 1.7 - 7.7 K/uL   Lymphocytes Relative 41 %   Lymphs Abs 4.5 (H) 0.7 - 4.0 K/uL   Monocytes Relative 7 %   Monocytes Absolute 0.7 0.1 - 1.0 K/uL   Eosinophils Relative 2 %   Eosinophils Absolute 0.2 0.0 - 0.7 K/uL   Basophils Relative 0 %   Basophils Absolute 0.0 0.0 - 0.1 K/uL    Comment: Performed at  Hospital [facility], [address] [place], [place] [address]  Comprehensive metabolic panel      Status: Abnormal   Collection Time: 04/07/17 12:51 AM  Result Value Ref Range   Sodium 136 135 - 145 mmol/L   Potassium 4.1 3.5 - 5.1 mmol/L   Chloride 100 (L) 101 - 111 mmol/L   CO2 25 22 - 32 mmol/L   Glucose, Bld 217 (H) 65 - 99 mg/dL   BUN 13 6 - 20 mg/dL   Creatinine, Ser 0.89 0.44 - 1.00 mg/dL   Calcium 8.8 (L) 8.9 - 10.3 mg/dL   Total Protein 7.1 6.5 - 8.1 g/dL   Albumin 3.3 (L) 3.5 - 5.0 g/dL   AST 16 15 - 41   U/L   ALT 12 (L) 14 - 54 U/L   Alkaline Phosphatase 64 38 - 126 U/L   Total Bilirubin 0.4 0.3 - 1.2 mg/dL   GFR calc non Af Amer >60 >60 mL/min   GFR calc Af Amer >60 >60 mL/min    Comment: (NOTE) The eGFR has been calculated using the CKD EPI equation. This calculation has not been validated in all clinical situations. eGFR's persistently <60 mL/min signify possible Chronic Kidney Disease.    Anion gap 11 5 - 15    Comment: Performed at [facility] [address] [place], [place] [address]  I-stat troponin, ED     Status: None   Collection Time: 04/07/17 12:59 AM  Result Value Ref Range   Troponin i, poc 0.00 0.00 - 0.08 ng/mL   Comment 3            Comment: Due to the release kinetics of cTnI, a negative result within the first hours of the onset of symptoms does not rule out myocardial infarction with certainty. If myocardial infarction is still suspected, repeat the test at appropriate intervals.   I-Stat beta hCG blood, ED     Status: None   Collection Time: 04/07/17 12:59 AM  Result Value Ref Range   I-stat hCG, quantitative <5.0 <5 mIU/mL   Comment 3            Comment:   GEST. AGE      CONC.  (mIU/mL)   <=1 WEEK        5 - 50     2 WEEKS       50 - 500     3 WEEKS       100 - 10,000     4 WEEKS     1,000 - 30,000        FEMALE AND NON-PREGNANT FEMALE:     LESS THAN 5 mIU/mL   I-Stat Chem 8, ED     Status: Abnormal   Collection Time: 04/07/17  1:01 AM  Result Value Ref Range   Sodium 138 135 - 145 mmol/L   Potassium 4.2 3.5 - 5.1  mmol/L   Chloride 99 (L) 101 - 111 mmol/L   BUN 15 6 - 20 mg/dL   Creatinine, Ser 0.90 0.44 - 1.00 mg/dL   Glucose, Bld 198 (H) 65 - 99 mg/dL   Calcium, Ion 1.17 1.15 - 1.40 mmol/L   TCO2 29 22 - 32 mmol/L   Hemoglobin 15.3 (H) 12.0 - 15.0 g/dL   HCT 45.0 36.0 - 46.0 %   Ct Head Code Stroke Wo Contrast  Result Date: 04/07/2017 CLINICAL DATA:  Code stroke. 52 year old female with right side numbness and weakness since 20/1 100 hours. EXAM: CT HEAD WITHOUT CONTRAST TECHNIQUE: Contiguous axial images were obtained from the base of the skull through the vertex without intravenous contrast. COMPARISON:  [facility] Head CTs without contrast 11/08/2012 and earlier. Brain MRI 10/07/2012. FINDINGS: Brain: Chronic lacunar infarct left corona radiata tracking to the posterior lentiform nuclei is stable since 2014. Gray-white matter differentiation elsewhere is stable and within normal limits. No midline shift, ventriculomegaly, mass effect, evidence of mass lesion, intracranial hemorrhage or evidence of cortically based acute infarction. No cortical encephalomalacia identified. Vascular: Mild Calcified atherosclerosis at the skull base. No suspicious intracranial vascular hyperdensity. Skull: Stable and negative. Sinuses/Orbits: Visualized paranasal sinuses and mastoids are stable and well pneumatized. Other: Visualized orbits and scalp soft tissues are within normal  limits. ASPECTS ([place] Stroke Program Early CT Score) 7 - Ganglionic level infarction (caudate, lentiform nuclei, internal capsule, insula, M1-M3 cortex): - Supraganglionic infarction (M4-M6 cortex): 3 Total score (0-10 with 10 being normal): 10 IMPRESSION: 1. Chronic small vessel disease in the left corona radiata and basal ganglia appears stable since 2014. 2. Stable and normal noncontrast CT appearance of the brain elsewhere. 3. ASPECTS is 10. 4. These results were communicated to Dr.  at 1:05 amon 3/26/2019by text  page via the [employer] messaging system. Electronically Signed   By: H  Hall M.D.   On: 04/07/2017 01:06    Assessment: 51 y.o. female presenting with acute onset of worsening of pre-existing deficits on her right side. Has a history of left basal ganglia/corona radiata lacunar infarction with right sided motor and sensory deficits.  1. Exam reveals some right sided sensory and motor deficits which are relatively mild, best localizable to the left cerebral hemisphere and possibly representing recrudescence of symptoms from her known prior lacunar infarction. Hyperreflexia on the right is most consistent with a chronic UMN deficit.  2. CT head: No acute abnormality. Stable chronic left basal ganglia and corona radiata lacunar infarction noted.  3. IV tPA Given - No: New symptoms in the same distribution as deficits from prior stroke; relatively mild symptoms/signs with a risk/benefit profile not favoring tPA administration. Following discussion of risks/benefits, the patient endorsed that she felt that the risks of tPA administration outweighed potential benefits.   4. Stroke Risk Factors - diabetes, prior stroke, morbid obesity, smoking  Recommendations: 1. HgbA1c, fasting lipid panel 2. MRI, MRA of the brain without contrast 3. PT consult, OT consult, Speech consult 4. Echocardiogram 5. MRA neck with contrast 6. Prophylactic therapy- Continue Plavix. May benefit from addition of ASA. Will defer to stroke team decision regarding possible DAPT following completion of stroke work up.  7. Risk factor modification 8. Telemetry monitoring 9. Frequent neuro checks 10. Permissive HTN x 24 hours 11. Glycemic control  12. Continue simvastatin  @Electronically signed: Dr.  @  04/07/2017, 2:43 AM    

## 2017-04-07 NOTE — ED Provider Notes (Signed)
[place] EMERGENCY DEPARTMENT Provider Note   CSN: [id number] Arrival date & time: 04/07/17  0028     History   Chief Complaint Chief Complaint  Patient presents with  . Code Stroke    HPI [name] is a 52 y.o. female.  HPI  12:46 AM Cleared for CT scan.  Reports severe headache and tingling of the right side of the face that began at 9:30 PM.  ABCs are intact.  This is a 52 year old female with a history of COPD, diabetes, stroke who presents with right facial numbness.  Patient reports at 9:30 PM she noted tingling over the right side of her upper and lower lip.  She wiped her mouth and it felt "different."  Since that time she has had progressive tingling of the whole right side of her face.  She denies any speech difficulty, extremity weakness, or other focal deficits.  Denies any chest pain.  She does report a 2-day history of headache.  She has a history of migraines.  Currently she rates her headache at 8 out of 10.  She has taken her normal pain medication with no relief.  She denies any recent fevers or neck stiffness.  Code stroke was initiated by triage nurse.  Patient evaluated by neurology.  Code stroke canceled given neuro exam.  Past Medical History:  Diagnosis Date  . Anxiety   . Cancer ([place])    a spot on liver and treated   . Complication of anesthesia    restless,easily upset  . COPD (chronic obstructive pulmonary disease) ([place])   . Diabetes mellitus without complication ([place])   . Headache(784.0)   . Restless   . Sleep apnea    neg test  . Stroke [facility])     Patient Active Problem List   Diagnosis Date Noted  . CVA (cerebral infarction) 10/11/2012  . Brain TIA 10/11/2012  . Hyperlipidemia 10/11/2012  . Sleep apnea 10/11/2012  . Diabetes ([place]) 10/11/2012  . Tobacco abuse 10/11/2012  . COPD (chronic obstructive pulmonary disease) ([place]) 10/11/2012    Past Surgical History:  Procedure Laterality Date  . ABDOMINAL  HYSTERECTOMY    . ANTERIOR CERVICAL DECOMP/DISCECTOMY FUSION N/A 03/11/2012   Procedure: ANTERIOR CERVICAL DECOMPRESSION/DISCECTOMY FUSION 1 LEVEL;  Surgeon: Ophelia Charter, MD;  Location: [place] [facility];  Service: Neurosurgery;  Laterality: N/A;  Cervical five-six anterior cervical decompression with fusion interbody prothesis plating and bonegraft  . BACK SURGERY    . EYE SURGERY    . HERNIA REPAIR    . JOINT REPLACEMENT     bil knees     OB History   None      Home Medications    Prior to Admission medications   Medication Sig Start Date End Date Taking? Authorizing Provider  ALPRAZolam Duanne Moron) 1 MG tablet Take 1 mg by mouth 3 (three) times daily as needed for anxiety.    Yes [provider]  BYDUREON BCISE 2 MG/0.85ML AUIJ Inject 2 mg into the skin once a week. On Thursday 03/03/17  Yes [provider]  citalopram (CELEXA) 40 MG tablet Take 40 mg by mouth daily. 03/23/17  Yes [provider]  clopidogrel (PLAVIX) 75 MG tablet Take 75 mg by mouth daily. 03/18/17  Yes [provider]  dicyclomine (BENTYL) 20 MG tablet Take 20 mg by mouth 2 (two) times daily. 03/06/17  Yes [provider]  gabapentin (NEURONTIN) 600 MG tablet Take 600 mg by mouth 4 (four) times  daily.   Yes [provider]  linagliptin (TRADJENTA) 5 MG TABS tablet Take 5 mg by mouth daily. 07/08/13  Yes [provider]  MOVANTIK 25 MG TABS tablet Take 25 mg by mouth daily. 03/03/17  Yes [provider]  Oxycodone HCl 10 MG TABS Take 10 mg by mouth 4 (four) times daily. 03/15/17  Yes [provider]  OXYCONTIN 30 MG 12 hr tablet Take 30 mg by mouth 2 (two) times daily. 03/15/17  Yes [provider]  pioglitazone (ACTOS) 45 MG tablet Take 45 mg by mouth daily.   Yes [provider]  simvastatin (ZOCOR) 40 MG tablet Take 1 tablet (40 mg total) by mouth every evening. 10/13/12  Yes Velvet Bathe, MD  Vitamin D, Ergocalciferol,  (DRISDOL) 50000 units CAPS capsule Take 50,000 Units by mouth every 7 (seven) days. On Wednesday   Yes [provider]  [facility] 625 MG tablet Take 1,875 mg by mouth 2 (two) times daily.  03/04/17  Yes [provider]  aspirin 325 MG tablet Take 1 tablet (325 mg total) by mouth daily. Patient not taking: Reported on 04/07/2017 10/13/12   Velvet Bathe, MD  insulin detemir (LEVEMIR) 100 UNIT/ML injection Inject 60 Units into the skin daily.    [provider]  oxyCODONE-acetaminophen (PERCOCET/ROXICET) 5-325 MG per tablet Take 1 tablet by mouth every 6 (six) hours. Patient not taking: Reported on 04/07/2017 10/13/12   Velvet Bathe, MD    Family History Family History  Problem Relation Age of Onset  . Diabetes Mother   . Hypertension Mother   . Hypertension Father     Social History Social History   Tobacco Use  . Smoking status: Current Every Day Smoker    Packs/day: 0.50    Years: 20.00    Pack years: 10.00    Types: Cigarettes  . Smokeless tobacco: Never Used  Substance Use Topics  . Alcohol use: No  . Drug use: Yes    Types: Marijuana     Allergies   Tramadol and Augmentin [amoxicillin-pot clavulanate]   Review of Systems Review of Systems  Constitutional: Negative for fever.  Respiratory: Negative for shortness of breath.   Cardiovascular: Negative for chest pain.  Gastrointestinal: Negative for abdominal pain, nausea and vomiting.  Genitourinary: Negative for dysuria.  Musculoskeletal: Negative for neck stiffness.  Neurological: Positive for numbness and headaches. Negative for weakness.  All other systems reviewed and are negative.    Physical Exam Updated Vital Signs BP 123/79   Pulse 75   Temp 98.4 F (36.9 C)   Resp 20   Ht 5\' 6"  (1.676 m)   Wt 136.1 kg (300 lb)   SpO2 96%   BMI 48.42 kg/m   Physical Exam  Constitutional: She is oriented to person, place, and time. She appears well-developed and well-nourished.  Morbidly  obese, smells of smoke  HENT:  Head: Normocephalic and atraumatic.  Cardiovascular: Normal rate, regular rhythm and normal heart sounds.  No murmur heard. Pulmonary/Chest: Effort normal. No respiratory distress. She has wheezes.  Abdominal: Soft. Bowel sounds are normal. There is no tenderness.  Neurological: She is alert and oriented to person, place, and time.  Cranial nerves II through XII intact, 5 out of 5 strength in all 4 extremities, no drift, subjective decreased sensation right face  Skin: Skin is warm and dry.  Psychiatric: She has a normal mood and affect.  Nursing note and vitals reviewed.    ED Treatments / Results  Labs (  all labs ordered are listed, but only abnormal results are displayed) Labs Reviewed  CBC - Abnormal; Notable for the following components:      Result Value   WBC 11.1 (*)    All other components within normal limits  DIFFERENTIAL - Abnormal; Notable for the following components:   Lymphs Abs 4.5 (*)    All other components within normal limits  COMPREHENSIVE METABOLIC PANEL - Abnormal; Notable for the following components:   Chloride 100 (*)    Glucose, Bld 217 (*)    Calcium 8.8 (*)    Albumin 3.3 (*)    ALT 12 (*)    All other components within normal limits  CBG MONITORING, ED - Abnormal; Notable for the following components:   Glucose-Capillary 216 (*)    All other components within normal limits  I-STAT CHEM 8, ED - Abnormal; Notable for the following components:   Chloride 99 (*)    Glucose, Bld 198 (*)    Hemoglobin 15.3 (*)    All other components within normal limits  PROTIME-INR  APTT  I-STAT TROPONIN, ED  I-STAT BETA HCG BLOOD, ED ([facility], [facility], [facility] ONLY)    EKG EKG Interpretation  Date/Time:  Tuesday April 07 2017 00:36:46 EDT Ventricular Rate:  87 PR Interval:  162 QRS Duration: 72 QT Interval:  380 QTC Calculation: 457 R Axis:   -7 Text Interpretation:  Normal sinus rhythm Normal ECG Confirmed by Thayer Jew [phone])  on 04/07/2017 12:45:17 AM   Radiology Ct Head Code Stroke Wo Contrast  Result Date: 04/07/2017 CLINICAL DATA:  Code stroke. 52 year old female with right side numbness and weakness since 20/1 100 hours. EXAM: CT HEAD WITHOUT CONTRAST TECHNIQUE: Contiguous axial images were obtained from the base of the skull through the vertex without intravenous contrast. COMPARISON:  [facility] Head CTs without contrast 11/08/2012 and earlier. Brain MRI 10/07/2012. FINDINGS: Brain: Chronic lacunar infarct left corona radiata tracking to the posterior lentiform nuclei is stable since 2014. Gray-white matter differentiation elsewhere is stable and within normal limits. No midline shift, ventriculomegaly, mass effect, evidence of mass lesion, intracranial hemorrhage or evidence of cortically based acute infarction. No cortical encephalomalacia identified. Vascular: Mild Calcified atherosclerosis at the skull base. No suspicious intracranial vascular hyperdensity. Skull: Stable and negative. Sinuses/Orbits: Visualized paranasal sinuses and mastoids are stable and well pneumatized. Other: Visualized orbits and scalp soft tissues are within normal limits. ASPECTS [facility] Stroke Program Early CT Score) 7 - Ganglionic level infarction (caudate, lentiform nuclei, internal capsule, insula, M1-M3 cortex): - Supraganglionic infarction (M4-M6 cortex): 3 Total score (0-10 with 10 being normal): 10 IMPRESSION: 1. Chronic small vessel disease in the left corona radiata and basal ganglia appears stable since 2014. 2. Stable and normal noncontrast CT appearance of the brain elsewhere. 3. ASPECTS is 10. 4. These results were communicated to Dr. Cheral Marker at [place] 3/26/2019by text page via the [facility] messaging system. Electronically Signed   By: Genevie Ann M.D.   On: 04/07/2017 01:06    Procedures Procedures (including critical care time)  Medications Ordered in ED Medications  LORazepam (ATIVAN) injection 1 mg (has  no administration in time range)  prochlorperazine (COMPAZINE) injection 10 mg (10 mg Intravenous Given 04/07/17 0228)  diphenhydrAMINE (BENADRYL) injection 25 mg (25 mg Intravenous Given 04/07/17 0227)     Initial Impression / Assessment and Plan / ED Course  I have reviewed the triage vital signs and the nursing notes.  Pertinent labs & imaging results that were available  during my care of the patient were reviewed by me and considered in my medical decision making (see chart for details).    Patient presents with right facial numbness and tingling.  She has had right-sided deficits from prior stroke.  She was evaluated initially as a code stroke.  Neurology evaluated patient.  Initially recommending MRI.  No TPA indicated.  Vital signs reassuring.  Patient also reports a headache.  I treated her for migraine.  No other red flags.  Basic lab work obtained and reassuring.  Patient is awaiting MRI.  Neurology consult note reviewed.  Actually recommending observation admission with stroke workup.  Will consult admitting hospitalist.  Final Clinical Impressions(s) / ED Diagnoses   Final diagnoses:  TIA (transient ischemic attack)  Numbness    ED Discharge Orders    None       Horton, Barbette Hair, MD 04/07/17 [phone]

## 2017-04-08 ENCOUNTER — Observation Stay (HOSPITAL_BASED_OUTPATIENT_CLINIC_OR_DEPARTMENT_OTHER): Payer: Medicare Other

## 2017-04-08 DIAGNOSIS — I6789 Other cerebrovascular disease: Secondary | ICD-10-CM | POA: Diagnosis not present

## 2017-04-08 DIAGNOSIS — E785 Hyperlipidemia, unspecified: Secondary | ICD-10-CM | POA: Diagnosis not present

## 2017-04-08 DIAGNOSIS — G459 Transient cerebral ischemic attack, unspecified: Secondary | ICD-10-CM | POA: Diagnosis not present

## 2017-04-08 DIAGNOSIS — G43109 Migraine with aura, not intractable, without status migrainosus: Secondary | ICD-10-CM | POA: Diagnosis not present

## 2017-04-08 DIAGNOSIS — Z8673 Personal history of transient ischemic attack (TIA), and cerebral infarction without residual deficits: Secondary | ICD-10-CM | POA: Diagnosis not present

## 2017-04-08 LAB — HIV ANTIBODY (ROUTINE TESTING W REFLEX): HIV SCREEN 4TH GENERATION: NONREACTIVE

## 2017-04-08 LAB — ECHOCARDIOGRAM COMPLETE
HEIGHTINCHES: 66 in
Weight: 4797.21 oz

## 2017-04-08 LAB — BASIC METABOLIC PANEL
ANION GAP: 9 (ref 5–15)
BUN: 13 mg/dL (ref 6–20)
CALCIUM: 8.6 mg/dL — AB (ref 8.9–10.3)
CHLORIDE: 105 mmol/L (ref 101–111)
CO2: 23 mmol/L (ref 22–32)
CREATININE: 0.83 mg/dL (ref 0.44–1.00)
GFR calc non Af Amer: >60 mL/min (ref >60)
Glucose, Bld: 205 mg/dL — ABNORMAL HIGH (ref 65–99)
Potassium: 4.2 mmol/L (ref 3.5–5.1)
Sodium: 137 mmol/L (ref 135–145)

## 2017-04-08 LAB — CBC
HCT: 44.9 % (ref 36.0–46.0)
HEMOGLOBIN: 14.4 g/dL (ref 12.0–15.0)
MCH: 28.7 pg (ref 26.0–34.0)
MCHC: 32.1 g/dL (ref 30.0–36.0)
MCV: 89.4 fL (ref 78.0–100.0)
PLATELETS: 266 10*3/uL (ref 150–400)
RBC: 5.02 MIL/uL (ref 3.87–5.11)
RDW: 14.3 % (ref 11.5–15.5)
WBC: 9 10*3/uL (ref 4.0–10.5)

## 2017-04-08 LAB — GLUCOSE, CAPILLARY
GLUCOSE-CAPILLARY: 200 mg/dL — AB (ref 65–99)
Glucose-Capillary: 229 mg/dL — ABNORMAL HIGH (ref 65–99)
Glucose-Capillary: 119 mg/dL — ABNORMAL HIGH (ref 65–99)

## 2017-04-08 MED ORDER — KETOROLAC TROMETHAMINE 30 MG/ML IJ SOLN
30.0000 mg | Freq: Once | INTRAMUSCULAR | Status: AC
  Administered 2017-04-08: 30 mg via INTRAVENOUS
  Filled 2017-04-08: qty 1

## 2017-04-08 MED ORDER — DIPHENHYDRAMINE HCL 50 MG/ML IJ SOLN
25.0000 mg | Freq: Once | INTRAMUSCULAR | Status: AC
  Administered 2017-04-08: 25 mg via INTRAVENOUS
  Filled 2017-04-08: qty 1

## 2017-04-08 MED ORDER — NICOTINE 21 MG/24HR TD PT24: 21.0000 mg | MEDICATED_PATCH | Freq: Every day | TRANSDERMAL | 0 refills | Status: DC

## 2017-04-08 MED ORDER — PROMETHAZINE HCL 25 MG/ML IJ SOLN
12.5000 mg | Freq: Once | INTRAMUSCULAR | Status: AC
  Administered 2017-04-08: 12.5 mg via INTRAVENOUS
  Filled 2017-04-08: qty 1

## 2017-04-08 MED ORDER — LINAGLIPTIN 5 MG PO TABS
5.0000 mg | ORAL_TABLET | Freq: Every day | ORAL | Status: DC
  Administered 2017-04-08: 5 mg via ORAL
  Filled 2017-04-08: qty 1

## 2017-04-08 MED ORDER — PERFLUTREN LIPID MICROSPHERE
1.0000 mL | INTRAVENOUS | Status: AC | PRN
  Filled 2017-04-08: qty 10

## 2017-04-08 MED ORDER — PROCHLORPERAZINE EDISYLATE 5 MG/ML IJ SOLN
10.0000 mg | Freq: Once | INTRAMUSCULAR | Status: AC
  Administered 2017-04-08: 10 mg via INTRAVENOUS
  Filled 2017-04-08: qty 2

## 2017-04-08 MED ORDER — OXYCODONE HCL 10 MG PO TABS: 10.0000 mg | ORAL_TABLET | Freq: Four times a day (QID) | ORAL | 0 refills | Status: DC | PRN

## 2017-04-08 MED ORDER — TOPIRAMATE 50 MG PO TABS: 50.0000 mg | ORAL_TABLET | Freq: Two times a day (BID) | ORAL | 0 refills | Status: DC

## 2017-04-08 MED ORDER — PIOGLITAZONE HCL 45 MG PO TABS
45.0000 mg | ORAL_TABLET | Freq: Every day | ORAL | Status: DC
  Administered 2017-04-08: 45 mg via ORAL
  Filled 2017-04-08: qty 1

## 2017-04-08 NOTE — Progress Notes (Signed)
SLP Cancellation Note  Patient Details Name: [name] MRN: [id number] DOB: Sep 02, 1965   Cancelled treatment:       Reason Eval/Treat Not Completed: SLP screened, no needs identified, will sign off   SLP interviewed pt with significant other present. Pt states that she has word finding difficulties but these are related to previous stroke and didn't inhibit her ability to communicate wants and needs during this screening. Pt's biggest compliant is headache, which nursing is aware of. No skilled ST identified by pt, significant other or SLP.    Opie Maclaughlin 04/08/2017, 1:52 PM

## 2017-04-08 NOTE — Discharge Summary (Signed)
Physician Discharge Summary  [name] ZYS:[id number] DOB: July 01, 1965 DOA: 04/07/2017  PCP: Remi Haggard, FNP  Admit date: 04/07/2017 Discharge date: 04/08/2017   Recommendations for Outpatient Follow-Up:   1. Neurology follow up for migraines 2. Home health   Discharge Diagnosis:   Active Problems:   Cerebral infarction ([place])   Brain TIA   Hyperlipidemia   Sleep apnea   Diabetes (HCC)   Tobacco abuse   COPD (chronic obstructive pulmonary disease) (HCC)   TIA (transient ischemic attack)   Anxiety   History of stroke   Complicated migraine   Discharge disposition:  Home  Discharge Condition: Improved.  Diet recommendation: Low sodium, heart healthy.  Carbohydrate-modified  Wound care: None.   History of Present Illness:   [name] is a 52 y.o. female with a Past Medical History of CVA in 9/15; OSA; DM; and COPD who presents with R-sided facial numbness and severe headache.  Code stroke was called in the ER and canceled after neurology consult.  Negative MRI for new stroke.  On exam, patient was pleasant and conversant in NAD.  Neuro exam significant for right-sided facial numbness in all CN 5 distributions and chronic R-sided weakness (difficult to tell if new weakness superimposed).  CTA also negative.   Hospital Course by Problem:   Per neuro: 52 year old female with history of diabetes, anxiety, COPD, cervical surgery C5-6 ACDF admitted for right-sided facial numbness and worsening right upper extremity weakness and numbness with severe headache.  She had stroke in 09/2012 with right-sided weakness.  MRI showed left BG/CR infarct.  A1c 10.5 LDL 131.  Put on aspirin 325.   On this admission, patient stated that he had right facial numbness which was new, but also right-sided weakness worsened in the setting of an episode of severe headache.  She had a history of migraine headache with severe headache 2 times per months, photophobia, phonophobia,  nausea vomiting and need to stay in a dark room lying down.  In addition, she has 3 days of mild headache every week.  She has been taking Tylenol, but no other preventive medication.  CT showed old left BG/CR infarct, MRI showed no acute infarct but old left BG/CR infarct.  CT head and neck no LVO but mild atherosclerosis.  LDL 67 and A1c 8.4.  UDS positive for benzos/opiates and THC, 2D echo stable  Patient current episode most likely due to complicated migraine versus recrudescence of old stroke in the setting of severe headache.  Patient does have migraine features, will need preventive medication due to frequency of headache episode.  She is obese, would recommend Topamax 50 mg twice daily.  Continue Plavix and Zocor for stroke prevention.  Stroke risk factor modification.      Medical Consultants:    Neuro   Discharge Exam:   Vitals:   04/08/17 0841 04/08/17 1215  BP: 116/60 109/86  Pulse: 73 81  Resp: 18   Temp: (!) 97.5 F (36.4 C) 97.9 F (36.6 C)  SpO2: 94% 94%   Vitals:   04/08/17 0012 04/08/17 0442 04/08/17 0841 04/08/17 1215  BP: 110/65 118/67 116/60 109/86  Pulse: 79 84 73 81  Resp: 18 18 18    Temp: 98.1 F (36.7 C) 98.6 F (37 C) (!) 97.5 F (36.4 C) 97.9 F (36.6 C)  TempSrc: Oral Oral Oral Oral  SpO2: 98% 98% 94% 94%  Weight: 136 kg (299 lb 13.2 oz)     Height:  Gen:  NAD    The results of significant diagnostics from this hospitalization (including imaging, microbiology, ancillary and laboratory) are listed below for reference.     Procedures and Diagnostic Studies:   Ct Angio Head W Or Wo Contrast  Result Date: 04/07/2017 CLINICAL DATA:  Code stroke for right facial tingling and weakness EXAM: CT ANGIOGRAPHY HEAD AND NECK TECHNIQUE: Multidetector CT imaging of the head and neck was performed using the standard protocol during bolus administration of intravenous contrast. Multiplanar CT image reconstructions and MIPs were obtained to  evaluate the vascular anatomy. Carotid stenosis measurements (when applicable) are obtained utilizing NASCET criteria, using the distal internal carotid diameter as the denominator. CONTRAST:  35mL ISOVUE-370 IOPAMIDOL (ISOVUE-370) INJECTION 76% COMPARISON:  Head CT and brain MRI from earlier today FINDINGS: CTA NECK FINDINGS Aortic arch: Mild atherosclerotic plaque. Three vessel branching. No acute finding. Right carotid system: Mild atheromatous plaque mainly at the bifurcation. No stenosis, ulceration, or beading. Left carotid system: Mild atheromatous plaque at the bifurcation. No stenosis, ulceration, or beading. Vertebral arteries: Atheromatous plaque without flow limiting stenosis at the proximal left subclavian. Strong right vertebral artery dominance. Most left vertebral flow is to the left PICA. Vertebral arteries are smooth and diffusely patent Skeleton: No acute or aggressive finding. C5-6 ACDF with solid arthrodesis Other neck: No acute finding. Optic drusen. Right parotid calcification. Upper chest: Pulmonary nodules seemingly randomly distributed small measuring up to 6 mm. Multiple pulmonary nodules were described on a 2017 chest CT at [facility] None of the described nodules measure larger than those seen today. Central airway thickening in this smoker. Saber shape of the trachea. Review of the MIP images confirms the above findings CTA HEAD FINDINGS Anterior circulation: The right ICA is larger than the left in the setting of asymmetric larger right posterior communicating artery. Mild atheromatous plaque on the carotid siphons. No branch occlusion, flow limiting stenosis, or beading. Negative for aneurysm. Posterior circulation: Right vertebral artery dominance. Fetal type right PCA. Limited visualization of the PCAs due to venous contamination. There is likely mild to moderate atheromatous narrowing of the distal right P2 segment. Venous sinuses: Patent Anatomic variants: As above Delayed phase: No  abnormal intracranial enhancement. Review of the MIP images confirms the above findings IMPRESSION: 1. Overall mild atherosclerosis in the head and neck without acute finding or flow limiting stenosis. 2. Multiple bilateral subcentimeter pulmonary nodules. Multiple pulmonary nodules and recommended follow-up chest CT were described on a [facility] chest CT report June 2017. Recommend PCP follow-up to ensure appropriate imaging has been obtained. Electronically Signed   By: Monte Fantasia M.D.   On: 04/07/2017 10:04   Ct Angio Neck W Or Wo Contrast  Result Date: 04/07/2017 CLINICAL DATA:  Code stroke for right facial tingling and weakness EXAM: CT ANGIOGRAPHY HEAD AND NECK TECHNIQUE: Multidetector CT imaging of the head and neck was performed using the standard protocol during bolus administration of intravenous contrast. Multiplanar CT image reconstructions and MIPs were obtained to evaluate the vascular anatomy. Carotid stenosis measurements (when applicable) are obtained utilizing NASCET criteria, using the distal internal carotid diameter as the denominator. CONTRAST:  48mL ISOVUE-370 IOPAMIDOL (ISOVUE-370) INJECTION 76% COMPARISON:  Head CT and brain MRI from earlier today FINDINGS: CTA NECK FINDINGS Aortic arch: Mild atherosclerotic plaque. Three vessel branching. No acute finding. Right carotid system: Mild atheromatous plaque mainly at the bifurcation. No stenosis, ulceration, or beading. Left carotid system: Mild atheromatous plaque at the bifurcation. No stenosis, ulceration, or beading. Vertebral arteries: Atheromatous  plaque without flow limiting stenosis at the proximal left subclavian. Strong right vertebral artery dominance. Most left vertebral flow is to the left PICA. Vertebral arteries are smooth and diffusely patent Skeleton: No acute or aggressive finding. C5-6 ACDF with solid arthrodesis Other neck: No acute finding. Optic drusen. Right parotid calcification. Upper chest: Pulmonary nodules  seemingly randomly distributed small measuring up to 6 mm. Multiple pulmonary nodules were described on a 2017 chest CT at [facility] None of the described nodules measure larger than those seen today. Central airway thickening in this smoker. Saber shape of the trachea. Review of the MIP images confirms the above findings CTA HEAD FINDINGS Anterior circulation: The right ICA is larger than the left in the setting of asymmetric larger right posterior communicating artery. Mild atheromatous plaque on the carotid siphons. No branch occlusion, flow limiting stenosis, or beading. Negative for aneurysm. Posterior circulation: Right vertebral artery dominance. Fetal type right PCA. Limited visualization of the PCAs due to venous contamination. There is likely mild to moderate atheromatous narrowing of the distal right P2 segment. Venous sinuses: Patent Anatomic variants: As above Delayed phase: No abnormal intracranial enhancement. Review of the MIP images confirms the above findings IMPRESSION: 1. Overall mild atherosclerosis in the head and neck without acute finding or flow limiting stenosis. 2. Multiple bilateral subcentimeter pulmonary nodules. Multiple pulmonary nodules and recommended follow-up chest CT were described on a [facility] chest CT report June 2017. Recommend PCP follow-up to ensure appropriate imaging has been obtained. Electronically Signed   By: Monte Fantasia M.D.   On: 04/07/2017 10:04   Mr Brain Wo Contrast  Result Date: 04/07/2017 CLINICAL DATA:  TIA.  Acute onset of right facial paresthesia. EXAM: MRI HEAD WITHOUT CONTRAST TECHNIQUE: Multiplanar, multiecho pulse sequences of the brain and surrounding structures were obtained without intravenous contrast. COMPARISON:  Head CT from earlier today.  Brain MRI 10/07/2012 FINDINGS: Brain: No acute infarction, hemorrhage, hydrocephalus, extra-axial collection or mass lesion. Remote perforator infarct in the left corona radiata posteriorly. Presumed mild chronic  small vessel ischemic gliosis in the pons and cerebral white matter. Normal brain volume. Vascular: Normal flow voids. Skull and upper cervical spine: Normal marrow signal. Sinuses/Orbits: Negative. IMPRESSION: 1. No acute finding. 2. Chronic small vessel ischemia. Remote left corona radiata perforator infarct. Electronically Signed   By: Monte Fantasia M.D.   On: 04/07/2017 08:07   Ct Head Code Stroke Wo Contrast  Result Date: 04/07/2017 CLINICAL DATA:  Code stroke. 52 year old female with right side numbness and weakness since 20/1 100 hours. EXAM: CT HEAD WITHOUT CONTRAST TECHNIQUE: Contiguous axial images were obtained from the base of the skull through the vertex without intravenous contrast. COMPARISON:  [facility] Head CTs without contrast 11/08/2012 and earlier. Brain MRI 10/07/2012. FINDINGS: Brain: Chronic lacunar infarct left corona radiata tracking to the posterior lentiform nuclei is stable since 2014. Gray-white matter differentiation elsewhere is stable and within normal limits. No midline shift, ventriculomegaly, mass effect, evidence of mass lesion, intracranial hemorrhage or evidence of cortically based acute infarction. No cortical encephalomalacia identified. Vascular: Mild Calcified atherosclerosis at the skull base. No suspicious intracranial vascular hyperdensity. Skull: Stable and negative. Sinuses/Orbits: Visualized paranasal sinuses and mastoids are stable and well pneumatized. Other: Visualized orbits and scalp soft tissues are within normal limits. ASPECTS [facility] Stroke Program Early CT Score) 7 - Ganglionic level infarction (caudate, lentiform nuclei, internal capsule, insula, M1-M3 cortex): - Supraganglionic infarction (M4-M6 cortex): 3 Total score (0-10 with 10 being normal): 10 IMPRESSION: 1.  Chronic small vessel disease in the left corona radiata and basal ganglia appears stable since 2014. 2. Stable and normal noncontrast CT appearance of the brain  elsewhere. 3. ASPECTS is 10. 4. These results were communicated to Dr. Cheral Marker at [place] 3/26/2019by text page via the [facility] messaging system. Electronically Signed   By: Genevie Ann M.D.   On: 04/07/2017 01:06     Labs:   Basic Metabolic Panel: Recent Labs  Lab 04/07/17 0051 04/07/17 0101 04/08/17 0845  NA 136 138 137  K 4.1 4.2 4.2  CL 100* 99* 105  CO2 25  --  23  GLUCOSE 217* 198* 205*  BUN 13 15 13   CREATININE 0.89 0.90 0.83  CALCIUM 8.8*  --  8.6*   GFR Estimated Creatinine Clearance: 111.4 mL/min (by C-G formula based on SCr of 0.83 mg/dL). Liver Function Tests: Recent Labs  Lab 04/07/17 0051  AST 16  ALT 12*  ALKPHOS 64  BILITOT 0.4  PROT 7.1  ALBUMIN 3.3*   No results for input(s): LIPASE, AMYLASE in the last 168 hours. No results for input(s): AMMONIA in the last 168 hours. Coagulation profile Recent Labs  Lab 04/07/17 0051  INR 0.94    CBC: Recent Labs  Lab 04/07/17 0051 04/07/17 0101 04/08/17 0845  WBC 11.1*  --  9.0  NEUTROABS 5.6  --   --   HGB 14.3 15.3* 14.4  HCT 44.0 45.0 44.9  MCV 88.2  --  89.4  PLT 301  --  266   Cardiac Enzymes: No results for input(s): CKTOTAL, CKMB, CKMBINDEX, TROPONINI in the last 168 hours. BNP: Invalid input(s): POCBNP CBG: Recent Labs  Lab 04/07/17 1605 04/07/17 1743 04/07/17 2135 04/08/17 0603 04/08/17 1135  GLUCAP 176* 165* 174* 200* 229*   D-Dimer No results for input(s): DDIMER in the last 72 hours. Hgb A1c Recent Labs    04/07/17 0828  HGBA1C 8.4*   Lipid Profile Recent Labs    04/07/17 0828  CHOL 136  HDL 31*  LDLCALC 67  TRIG 191*  CHOLHDL 4.4   Thyroid function studies Recent Labs    04/07/17 0828  TSH 5.336*   Anemia work up Recent Labs    04/07/17 [place] 252   Microbiology No results found for this or any previous visit (from the past 240 hour(s)).   Discharge Instructions:   Discharge Instructions    Ambulatory referral to Neurology   Complete by:   As directed    Follow up with stroke clinic NP (Zymere Patlan Vanschaick or Cecille Rubin, if both not available, consider Zachery Dauer, or [place]) at [facility] in about 4 weeks. Thanks.   Diet - low sodium heart healthy   Complete by:  As directed    Diet Carb Modified   Complete by:  As directed    Discharge instructions   Complete by:  As directed    Headache diary-- bring to next neurology appointment Consider speaking with PCP if last sleep study was prior to weight gain   Increase activity slowly   Complete by:  As directed      Allergies as of 04/08/2017      Reactions   Tramadol Nausea And Vomiting, Other (See Comments)   Increase blood pressure   Augmentin [amoxicillin-pot Clavulanate] Rash      Medication List    STOP taking these medications   aspirin 325 MG tablet   oxyCODONE-acetaminophen 5-325 MG tablet Commonly known as:  PERCOCET/ROXICET  TAKE these medications   ALPRAZolam 1 MG tablet Commonly known as:  XANAX Take 1 mg by mouth 3 (three) times daily as needed for anxiety.   BYDUREON BCISE 2 MG/0.85ML [place] Generic drug:  Exenatide ER Inject 2 mg into the skin once a week. On Thursday   citalopram 40 MG tablet Commonly known as:  CELEXA Take 40 mg by mouth daily.   clopidogrel 75 MG tablet Commonly known as:  PLAVIX Take 75 mg by mouth daily.   dicyclomine 20 MG tablet Commonly known as:  BENTYL Take 20 mg by mouth 2 (two) times daily.   gabapentin 600 MG tablet Commonly known as:  NEURONTIN Take 600 mg by mouth 4 (four) times daily.   linagliptin 5 MG Tabs tablet Commonly known as:  TRADJENTA Take 5 mg by mouth daily.   MOVANTIK 25 MG Tabs tablet Generic drug:  naloxegol oxalate Take 25 mg by mouth daily.   nicotine 21 mg/24hr patch Commonly known as:  NICODERM CQ - dosed in mg/24 hours Place 1 patch (21 mg total) onto the skin daily. Start taking on:  04/09/2017   OXYCONTIN 30 MG 12 hr tablet Generic drug:  oxyCODONE Take 30 mg by mouth  2 (two) times daily. What changed:  Another medication with the same name was changed. Make sure you understand how and when to take each.   Oxycodone HCl 10 MG Tabs Take 1 tablet (10 mg total) by mouth 4 (four) times daily as needed (pain). What changed:    when to take this  reasons to take this   pioglitazone 45 MG tablet Commonly known as:  ACTOS Take 45 mg by mouth daily.   simvastatin 40 MG tablet Commonly known as:  ZOCOR Take 1 tablet (40 mg total) by mouth every evening.   topiramate 50 MG tablet Commonly known as:  TOPAMAX Take 1 tablet (50 mg total) by mouth 2 (two) times daily.   Vitamin D (Ergocalciferol) 50000 units Caps capsule Commonly known as:  DRISDOL Take 50,000 Units by mouth every 7 (seven) days. On Wednesday   WELCHOL 625 MG tablet Generic drug:  colesevelam Take 1,875 mg by mouth 2 (two) times daily.      Follow-up Information    [facility] Follow up in 4 week(s).   Specialty:  Neurology Why:  for headache management Contact information: [place] [phone]       Remi Haggard, FNP Follow up in 1 week(s).   Specialty:  Family Medicine Contact information: [place] [address] [phone]            Time coordinating discharge: 35 min  Signed:  Geradine Girt   [facility] 04/08/2017, 2:50 PM

## 2017-04-08 NOTE — Progress Notes (Signed)
Patient seen for new lack of movement of RLE per nursing.   Exam:  Ment: Drowsy with fluent speech and intact comprehension.  CN: Unchanged.  Motor: She is able to wiggle toes, dorsiflex and plantar flex bilaterally, extend at knee and flex at knee and hip bilaterally, with 5/5 strength on the left and variable strength on the right with positive Hoover's sign, effort dependence and distractability.  Sensory: Endorses decreased sensation on the right to FT  A/R: Headache with right sided weakness 1. Has old lacunar infarction with associated right sided weakness 2. Subjectively worse RLE strength per patient in the context of headache. She stated that the worsening of her right lower extremity strength is typical of one of her complicated migraines, and she has a history of such. Exam is with variable effort on the right; no definite findings to suggest a new stroke.  3. Phenergan 12. 5 mg IV x 1 for headache.  4. Continue to monitor closely.   Electronically signed: Dr. Kerney Elbe

## 2017-04-08 NOTE — Progress Notes (Signed)
PT Cancellation Note  Patient Details Name: [name] MRN: [id number] DOB: 1965-07-08   Cancelled Treatment:    Reason Eval/Treat Not Completed: Patient at procedure or test/unavailable. Patient off unit receiving Echo. Will follow up as schedule allows.  Ellamae Sia, PT, DPT Acute Rehabilitation Services  Pager: [phone]    [name] 04/08/2017, 12:53 PM

## 2017-04-08 NOTE — Progress Notes (Signed)
Inpatient Diabetes Program Recommendations  AACE/ADA: New Consensus Statement on Inpatient Glycemic Control (2015)  Target Ranges:  Prepandial:   less than 140 mg/dL      Peak postprandial:   less than 180 mg/dL (1-2 hours)      Critically ill patients:  140 - 180 mg/dL   Lab Results  Component Value Date   GLUCAP 200 (H) 04/08/2017   HGBA1C 8.4 (H) 04/07/2017    Review of Glycemic Control Results for [name], [name] (MRN [id number]) as of 04/08/2017 11:26  Ref. Range 04/07/2017 11:48 04/07/2017 16:05 04/07/2017 17:43 04/07/2017 21:35 04/08/2017 06:03  Glucose-Capillary Latest Ref Range: 65 - 99 mg/dL 187 (H) 176 (H) 165 (H) 174 (H) 200 (H)   Diabetes history: Type 2 DM Outpatient Diabetes medications: Tradjenta 5 mg daily, Actos 45 mg daily  Current orders for Inpatient glycemic control:  Novolog resistant tid with meals, Actos 45 mg daily, Tradjenta 5 mg daily Inpatient Diabetes Program Recommendations:   May consider adding Lantus 15 units daily.   Thanks,  Adah Perl, RN, BC-ADM Inpatient Diabetes Coordinator Pager [phone] (8a-5p)

## 2017-04-08 NOTE — Progress Notes (Signed)
On assessment, noted rt LE weakness, Pt states, weakness is new, not present prior to admission. Paged to inform Neurology  on-call- MD E. Cheral Marker

## 2017-04-08 NOTE — Progress Notes (Signed)
Pt being discharged from hospital per orders from MD. Pt educated on discharge instructions. Pt verbalized understanding of instructions. All questions and concerns were addressed. Pt's IV was removed prior to discharge. Pt exited hospital via wheelchair accompanied by staff. 

## 2017-04-08 NOTE — Progress Notes (Signed)
Physical Therapy Treatment and d/c Patient Details Name: [name] MRN: [id number] DOB: [birth year] Today's Date: 04/08/2017    History of Present Illness Patient is a 52 y/o female who presents with HA and right numbness/weakness. NIH:3. Head CT and MRI-unremarkable. PMH includes CVA with residual dysarthria, decreased sensation RUE/LE, DM, COPD.     PT Comments    Patient currently at baseline in regards to functional mobility including bed mobility, transfers, and ambulation. Patient continues with right ankle instability and decreased RLE weightbearing during gait secondary to decreased eversion and dorsiflexion. Patient states she had a brace 5 years ago (from report, sounds like an AFO), but it no longer fits her. Patient could benefit from increased medial/lateral support for improved balance during gait. No further acute care PT needs at this time. Patient and patient husband have no further questions or concerns. Please re-consult if status changes.    Follow Up Recommendations  Home health PT;Supervision - Intermittent     Equipment Recommendations  None recommended by PT    Recommendations for Other Services       Precautions / Restrictions Precautions Precautions: Fall Precaution Comments: Right ankle instability Restrictions Weight Bearing Restrictions: No    Mobility  Bed Mobility Overal bed mobility: Needs Assistance Bed Mobility: Supine to Sit;Sit to Supine     Supine to sit: Supervision Sit to supine: Min assist;Supervision   General bed mobility comments: Increased time required for supine to sit  Transfers Overall transfer level: Needs assistance Equipment used: Rolling walker (2 wheeled) Transfers: Sit to/from Stand Sit to Stand: Supervision         General transfer comment: Supervision for sit to stand from bed and toilet  Ambulation/Gait Ambulation/Gait assistance: Supervision Ambulation Distance (Feet): 15 Feet Assistive device: Rolling  walker (2 wheeled) Gait Pattern/deviations: Step-through pattern;Decreased dorsiflexion - right;Trunk flexed;Decreased weight shift to right Gait velocity: decreased Gait velocity interpretation: Below normal speed for age/gender General Gait Details: Slow, unsteady gait with right ankle instability - premorbid. Increased WB through BUEs and decreased WB through RLE.   Stairs            Wheelchair Mobility    Modified Rankin (Stroke Patients Only)       Balance Overall balance assessment: Needs assistance Sitting-balance support: Feet supported;No upper extremity supported Sitting balance-Leahy Scale: Good     Standing balance support: During functional activity Standing balance-Leahy Scale: Fair Standing balance comment: Able to stand without UE support at sink                            Cognition Arousal/Alertness: Awake/alert Behavior During Therapy: WFL for tasks assessed/performed Overall Cognitive Status: Within Functional Limits for tasks assessed                                        Exercises Other Exercises Other Exercises: 3 sit to stands with bilateral hand support with focus on eccentric control upon descent Other Exercises: Seated ankle eversion AROM x 10 Other Exercises: Seated resisted ankle eversion AROM x 10 (orange theraband) Other Exercises: Seated ankle dorsiflexion AROM x 10 Other Exercises: Seated resisted ankle dorsiflexion x 10 (orange theraband)    General Comments General comments (skin integrity, edema, etc.): Husband present at beginning of session      Pertinent Vitals/Pain Pain Assessment: No/denies pain Pain Location: headache Pain Descriptors /  Indicators: Headache    Home Living                      Prior Function            PT Goals (current goals can now be found in the care plan section) Acute Rehab PT Goals Patient Stated Goal: Get back to normal PT Goal Formulation: With  patient Time For Goal Achievement: 04/21/17 Potential to Achieve Goals: Good Progress towards PT goals: Goals met/education completed, patient discharged from PT    Frequency    Min 3X/week      PT Plan Current plan remains appropriate    Co-evaluation              AM-PAC PT "6 Clicks" Daily Activity  Outcome Measure  Difficulty turning over in bed (including adjusting bedclothes, sheets and blankets)?: None Difficulty moving from lying on back to sitting on the side of the bed? : None Difficulty sitting down on and standing up from a chair with arms (e.g., wheelchair, bedside commode, etc,.)?: None Help needed moving to and from a bed to chair (including a wheelchair)?: A Little Help needed walking in hospital room?: A Little Help needed climbing 3-5 steps with a railing? : A Lot 6 Click Score: 20    End of Session   Activity Tolerance: Patient tolerated treatment well Patient left: in bed;with call bell/phone within reach;with bed alarm set   PT Visit Diagnosis: Hemiplegia and hemiparesis;Difficulty in walking, not elsewhere classified (R26.2);Other abnormalities of gait and mobility (R26.89) Hemiplegia - Right/Left: Right Hemiplegia - dominant/non-dominant: Dominant     Time: 1413-1430 PT Time Calculation (min) (ACUTE ONLY): 17 min  Charges:  $Therapeutic Exercise: 8-22 mins                    G Codes:       Ellamae Sia, PT, DPT Acute Rehabilitation Services  Pager: [phone]    Willy Eddy 04/08/2017, 3:28 PM

## 2017-04-08 NOTE — Evaluation (Signed)
Occupational Therapy Evaluation Patient Details Name: [name] MRN: [id number] DOB: 01-24-1965 Today's Date: 04/08/2017    History of Present Illness Patient is a 52 y/o female who presents with HA and right numbness/weakness. NIH:3. Head CT and MRI-unremarkable. PMH includes CVA with residual dysarthria, decreased sensation RUE/LE, DM, COPD.    Clinical Impression   PTA, pt was living with her husband and was independent with ADLs and husband completed all IADLs. Upon arrival, pt with urinary incontinence in bed. Pt performing functional mobility to and from bathroom with Min Guard and RW. Pt performing sponge bathe and toileting with Min A. Pt presenting with blurry vision in right eye, generalized weakness with residual right sided weakness. Pt and husband planning on moving at the beginning of April into a larger home to increase pt's mobility in home. Pt would benefit from further acute OT to facilitate safe dc. Recommend dc to home with HHOT for further OT to optimize safety, independence with ADLs, and return to PLOF.      Follow Up Recommendations  Home health OT;Supervision/Assistance - 24 hour ; follow up with eye MD for blurry vision   Equipment Recommendations  None recommended by OT    Recommendations for Other Services PT consult     Precautions / Restrictions Precautions Precautions: Fall Precaution Comments: Right ankle instability Restrictions Weight Bearing Restrictions: No      Mobility Bed Mobility Overal bed mobility: Needs Assistance Bed Mobility: Supine to Sit;Sit to Supine     Supine to sit: Min assist Sit to supine: Min assist   General bed mobility comments: Husband holding onto pt's arm and helping pull her into sitting. When asked, pt and husband reports that they do this at home and she has a trapeze bar at home  Transfers Overall transfer level: Needs assistance Equipment used: Rolling walker (2 wheeled) Transfers: Sit to/from Stand Sit  to Stand: Min assist;From elevated surface         General transfer comment: MIn A to power up into standing    Balance Overall balance assessment: Needs assistance Sitting-balance support: Feet supported;No upper extremity supported Sitting balance-Leahy Scale: Good     Standing balance support: During functional activity Standing balance-Leahy Scale: Fair Standing balance comment: Able to stand without UE support at sink                           ADL either performed or assessed with clinical judgement   ADL Overall ADL's : Needs assistance/impaired Eating/Feeding: Set up;Sitting   Grooming: Min guard;Wash/dry hands;Standing Grooming Details (indicate cue type and reason): Pt performing hand hygiene at sink with MIn Guard for safety Upper Body Bathing: Minimal assistance;Sitting Upper Body Bathing Details (indicate cue type and reason): MIn A for sponge bath and cleaning back. Pt able to complete rest of sponge bath while seated Lower Body Bathing: Minimal assistance;Sit to/from stand Lower Body Bathing Details (indicate cue type and reason): Min A for washing right side of bottom due to decreased ROM of RUE. Able to complete all other areas of LEs Upper Body Dressing : Set up;Sitting Upper Body Dressing Details (indicate cue type and reason): don new gown Lower Body Dressing: Min guard;Sit to/from stand Lower Body Dressing Details (indicate cue type and reason): Pt able to adjust socks by bringing ankles to knees. MI nGuard for safety in standing Toilet Transfer: Minimal assistance;Regular Toilet;Ambulation;Grab bars;RW Armed forces technical officer Details (indicate cue type and reason): Min A to power  up into standing from regular height toilet.  Toileting- Clothing Manipulation and Hygiene: Minimal assistance;Sit to/from stand Toileting - Clothing Manipulation Details (indicate cue type and reason): MIn A for cleaning right side. Pt able to complete clothing management and  clean peri area     Functional mobility during ADLs: Min guard;Rolling walker General ADL Comments: Pt performing ADLs at [employer] A level. Pt stating she feels weak and fatigued. When asked, pt states she feels is at 60% due to weakness     Vision Baseline Vision/History: Wears glasses Wears Glasses: Distance only Patient Visual Report: Blurring of vision;Other (comment)(Right eye only) Vision Assessment?: Yes Eye Alignment: Within Functional Limits Ocular Range of Motion: Within Functional Limits Alignment/Gaze Preference: Within Defined Limits Tracking/Visual Pursuits: Able to track stimulus in all quads without difficulty Convergence: Within functional limits Visual Fields: No apparent deficits Additional Comments: Pt able to perform tracking, convergence, occular ROM, and peripheral vision WFL. Reports right eye blurriness. Blurriness continues when she covered left eye and goes away when she covered right eye.      Perception     Praxis      Pertinent Vitals/Pain Pain Assessment: 0-10 Pain Score: 8  Pain Location: headache Pain Descriptors / Indicators: Headache Pain Intervention(s): Monitored during session;Repositioned;Patient requesting pain meds-RN notified     Hand Dominance Right   Extremity/Trunk Assessment Upper Extremity Assessment Upper Extremity Assessment: RUE deficits/detail RUE Deficits / Details: Residual weakness in RUE including grasp.  RUE Sensation: decreased light touch RUE Coordination: decreased gross motor;decreased fine motor   Lower Extremity Assessment Lower Extremity Assessment: Defer to PT evaluation   Cervical / Trunk Assessment Cervical / Trunk Assessment: Other exceptions Cervical / Trunk Exceptions: Increased body habitus   Communication Communication Communication: No difficulties   Cognition Arousal/Alertness: Awake/alert Behavior During Therapy: WFL for tasks assessed/performed Overall Cognitive Status: Within  Functional Limits for tasks assessed                                 General Comments: Upon arrival, pt having urinary incontiance at bed. Pt and husband reporting that pt has been very tired and hasnt slept well; feel she urinated in her sleep.   General Comments  Husband present throughout session    Exercises     Shoulder Instructions      Home Living Family/patient expects to be discharged to:: Private residence Living Arrangements: Spouse/significant other Available Help at Discharge: Family;Available 24 hours/day Type of Home: Mobile home Home Access: Level entry     Home Layout: One level     Bathroom Shower/Tub: Tub/shower unit   Bathroom Toilet: Standard(Uses BCS)     Home Equipment: Walker - 2 wheels;Wheelchair - manual;Bedside commode;Cane - quad;Tub bench          Prior Functioning/Environment Level of Independence: Needs assistance  Gait / Transfers Assistance Needed: Walks short household distances, otherwise uses w/c for community ambulation. ADL's / Homemaking Assistance Needed: Independent for ADLs. Spouse does all IADLs            OT Problem List: Decreased strength;Decreased range of motion;Decreased activity tolerance;Impaired balance (sitting and/or standing);Impaired vision/perception;Decreased cognition;Decreased safety awareness;Pain      OT Treatment/Interventions: Self-care/ADL training;Therapeutic exercise;Energy conservation;DME and/or AE instruction;Therapeutic activities;Patient/family education    OT Goals(Current goals can be found in the care plan section) Acute Rehab OT Goals Patient Stated Goal: Get back to normal OT Goal Formulation: With patient/family Time For  Goal Achievement: 04/22/17 Potential to Achieve Goals: Good  OT Frequency: Min 2X/week   Barriers to D/C:            Co-evaluation              AM-PAC PT "6 Clicks" Daily Activity     Outcome Measure Help from another person eating meals?:  None Help from another person taking care of personal grooming?: A Little Help from another person toileting, which includes using toliet, bedpan, or urinal?: A Little Help from another person bathing (including washing, rinsing, drying)?: A Little Help from another person to put on and taking off regular upper body clothing?: A Little Help from another person to put on and taking off regular lower body clothing?: A Little 6 Click Score: 19   End of Session Equipment Utilized During Treatment: Rolling walker Nurse Communication: Mobility status;Patient requests pain meds  Activity Tolerance: Patient tolerated treatment well Patient left: in bed;with call bell/phone within reach;with bed alarm set;with family/visitor present  OT Visit Diagnosis: Unsteadiness on feet (R26.81);Other abnormalities of gait and mobility (R26.89);Muscle weakness (generalized) (M62.81);Pain Pain - Right/Left: Right Pain - part of body: (Headache)                Time: 7915-0569 OT Time Calculation (min): 30 min Charges:  OT General Charges $OT Visit: 1 Visit OT Evaluation $OT Eval Moderate Complexity: 1 Mod OT Treatments $Self Care/Home Management : 8-22 mins G-Codes:     [place], OTR/L Acute Rehab Pager: [phone] Office: [place] 04/08/2017, 8:45 AM

## 2017-04-08 NOTE — Care Management Obs Status (Signed)
[facility] NOTIFICATION   Patient Details  Name: [name] MRN: [id number] Date of Birth: 1965/04/26   Medicare Observation Status Notification Given:  Yes    Pollie Friar, RN 04/08/2017, 11:45 AM

## 2017-04-08 NOTE — Progress Notes (Signed)
On assessment, noted rt LE weakness, Pt states, weakness is new, not present prior to admission. Paged to inform provider on-call- K. Schorr

## 2017-04-08 NOTE — Care Management Note (Signed)
Case Management Note  Patient Details  Name: DIAHANN GUAJARDO MRN: [id number] Date of Birth: [birth year]  Subjective/Objective:     Pt in with [facility]. She is from home with her spouse.              Action/Plan: Pt with orders for [facility] services. CM provided choice and they selected [facility]. Sharmon Revere with [facility] notified and accepted the referral.  Pt has supervision at home and transportation home.  Her PCP is Threasa Alpha, NP with [facility].   Expected Discharge Date:                  Expected Discharge Plan:  [place]  In-House Referral:     Discharge planning Services  CM Consult  Post Acute Care Choice:  Home Health Choice offered to:  Patient, Spouse  DME Arranged:    DME Agency:     HH Arranged:  PT, OT HH Agency:  [place]  Status of Service:  Completed, signed off  If discussed at [place] of Stay Meetings, dates discussed:    Additional Comments:  Pollie Friar, RN 04/08/2017, 11:43 AM

## 2017-04-08 NOTE — Progress Notes (Signed)
  Echocardiogram 2D Echocardiogram has been performed.  [name] 04/08/2017, 10:53 AM

## 2017-05-13 ENCOUNTER — Ambulatory Visit: Payer: Self-pay | Admitting: Adult Health

## 2017-05-13 ENCOUNTER — Encounter: Payer: Self-pay | Admitting: Adult Health

## 2017-05-14 ENCOUNTER — Telehealth: Payer: Self-pay

## 2017-05-14 NOTE — Telephone Encounter (Signed)
Pt no show for appt on 05/13/2017.

## 2017-05-20 ENCOUNTER — Ambulatory Visit (INDEPENDENT_AMBULATORY_CARE_PROVIDER_SITE_OTHER): Payer: Medicare Other | Admitting: Adult Health

## 2017-05-20 ENCOUNTER — Encounter: Payer: Self-pay | Admitting: Adult Health

## 2017-05-20 VITALS — BP 123/79 | HR 91 | Ht 66.0 in | Wt 321.0 lb

## 2017-05-20 DIAGNOSIS — E785 Hyperlipidemia, unspecified: Secondary | ICD-10-CM

## 2017-05-20 DIAGNOSIS — Z720 Tobacco use: Secondary | ICD-10-CM | POA: Diagnosis not present

## 2017-05-20 DIAGNOSIS — E1159 Type 2 diabetes mellitus with other circulatory complications: Secondary | ICD-10-CM | POA: Diagnosis not present

## 2017-05-20 DIAGNOSIS — I639 Cerebral infarction, unspecified: Secondary | ICD-10-CM

## 2017-05-20 DIAGNOSIS — G43611 Persistent migraine aura with cerebral infarction, intractable, with status migrainosus: Secondary | ICD-10-CM

## 2017-05-20 MED ORDER — TOPIRAMATE 100 MG PO TABS: 100.0000 mg | ORAL_TABLET | Freq: Two times a day (BID) | ORAL | 4 refills | Status: DC

## 2017-05-20 MED ORDER — GABAPENTIN 600 MG PO TABS: 600.0000 mg | ORAL_TABLET | Freq: Four times a day (QID) | ORAL | 3 refills | Status: DC

## 2017-05-20 NOTE — Progress Notes (Addendum)
[facility] [address] [place]. [place] [address] [phone]       OFFICE FOLLOW UP NOTE  [name] Date of Birth:  11/09/1965 Medical Record Number:  [id number]   Reason for Referral:  hospital TIA vs complicated migraine follow up  CHIEF COMPLAINT:  Chief Complaint  Patient presents with  . New Patient (Initial Visit)    stroke follow up. Patient reports that she still has headaches.     HPI: [name] is being seen today for initial visit in the office for TIA vs complicated migraine on 02/25/06. History obtained from patient, husband and chart review. Reviewed all radiology images and labs personally.  [name] is a 52 y.o. female with history of stroke 5 yrs ago w/ resultant RLE weakness, dysarthria and sensory deficit, DM, and OSA, presenting with right facial paresthesia with sensory numbness, in conjunction with worsening of her baseline RUE paresthesia, numbness and weakness and facial droop. She did not receive IV t-PA.  CT head reviewed and did show small vessel disease but negative for acute abnormality.  MRI head reviewed and showed no acute stroke.  CTA head and neck showed mild atherosclerosis and multiple bilateral subcentimeter pulmonary nodules.  2D echo showed an EF of 60 to 65%.  LDL 67 and A1c 8.4.  Patient was on Plavix 75 mg PTA and recommended to be discharged on Plavix.  Recommended to continue Zocor 40 mg for cholesterol management.  Discussed better home control for uncontrolled diabetes.  This current episode most likely due to complicated migraine versus recurrence of old stroke in the setting of severe headache.  Patient did have migraine features, and will need preventative medication due to frequency of headache episodes.  Recommended Topamax 50 mg twice daily.  Patient discharged home in stable condition.  Since discharge, she has been continuing to have daily headaches.  She was recently prescribed Maxalt by her PCP  and has taken approximately 4 in the past month.  She has been compliant with Topamax taking 50 mg twice a day.  She does however use BC powder 2 times per day.  Headaches are accompanied by photophobia, phonophobia, blurry vision and nausea/vomiting.  These headaches are debilitating to patient as she has to lay down in a dark room and close her eyes.  Patient does have a history of migraines as a teenager but states she has not been having an issue with these in the past 5 years.  For the past month, these headaches have been daily and getting worse.  Per patient, insurance is refusing to continue to pay for gabapentin as she was previously on 600 mg 4 times daily.  PCP recently cut this back to 1200 mg total.  Per patient, her diabetic neuropathy has worsened where she has increased in pain, numbness and tingling.  She has been receiving home PT.  She is currently sitting in a wheelchair she is unable to ambulate far distances due to residual right hemiparesis from previous stroke in 2014.  Patient has not undergone sleep study to assess for OSA at this time but is interested in having a sleep study done.  Patient does admit to snoring, daytime fatigue, apneic episodes heard by partner, obese, smoker and insomnia.   ROS:   14 system review of systems performed and negative with exception of fever/chills, fatigue, swelling legs, hearing loss, spinning sensation, trouble swallowing, moles, shortness of breath, coughing, snoring, incontinence, easy bruising, feeling hot, feeling cold,  increased thirst, joint pain, memory loss, confusion, headache, numbness, difficulty swallowing, depression, anxiety, not enough sleep, insomnia and snoring  PMH:  Past Medical History:  Diagnosis Date  . Anxiety   . Cancer ([place])    a spot on liver and treated   . Complication of anesthesia    restless,easily upset  . COPD (chronic obstructive pulmonary disease) ([place])   . Diabetes mellitus without complication ([place])   .  Headache(784.0)   . Restless   . Sleep apnea    neg test  . Stroke [facility])     PSH:  Past Surgical History:  Procedure Laterality Date  . ABDOMINAL HYSTERECTOMY    . ANTERIOR CERVICAL DECOMP/DISCECTOMY FUSION N/A 03/11/2012   Procedure: ANTERIOR CERVICAL DECOMPRESSION/DISCECTOMY FUSION 1 LEVEL;  Surgeon: Ophelia Charter, MD;  Location: [facility];  Service: Neurosurgery;  Laterality: N/A;  Cervical five-six anterior cervical decompression with fusion interbody prothesis plating and bonegraft  . BACK SURGERY    . EYE SURGERY    . HERNIA REPAIR    . JOINT REPLACEMENT     bil knees    Social History:  Social History   Socioeconomic History  . Marital status: Divorced    Spouse name: Not on file  . Number of children: Not on file  . Years of education: Not on file  . Highest education level: Not on file  Occupational History  . Not on file  Social Needs  . Financial resource strain: Not on file  . Food insecurity:    Worry: Not on file    Inability: Not on file  . Transportation needs:    Medical: Not on file    Non-medical: Not on file  Tobacco Use  . Smoking status: Current Every Day Smoker    Packs/day: 0.50    Years: 20.00    Pack years: 10.00    Types: Cigarettes  . Smokeless tobacco: Never Used  Substance and Sexual Activity  . Alcohol use: No  . Drug use: Yes    Types: Marijuana  . Sexual activity: Not on file  Lifestyle  . Physical activity:    Days per week: Not on file    Minutes per session: Not on file  . Stress: Not on file  Relationships  . Social connections:    Talks on phone: Not on file    Gets together: Not on file    Attends religious service: Not on file    Active member of club or organization: Not on file    Attends meetings of clubs or organizations: Not on file    Relationship status: Not on file  . Intimate partner violence:    Fear of current or ex partner: Not on file    Emotionally abused: Not on file    Physically abused:  Not on file    Forced sexual activity: Not on file  Other Topics Concern  . Not on file  Social History Narrative  . Not on file    Family History:  Family History  Problem Relation Age of Onset  . Diabetes Mother   . Hypertension Mother   . Hypertension Father     Medications:   Current Outpatient Medications on File Prior to Visit  Medication Sig Dispense Refill  . ALPRAZolam (XANAX) 1 MG tablet Take 1 mg by mouth 3 (three) times daily as needed for anxiety.     Marland Kitchen BYDUREON BCISE 2 MG/0.85ML AUIJ Inject 2 mg into the skin once a week.  On Thursday    . citalopram (CELEXA) 40 MG tablet Take 40 mg by mouth daily.    . clopidogrel (PLAVIX) 75 MG tablet Take 75 mg by mouth daily.    Marland Kitchen dicyclomine (BENTYL) 20 MG tablet Take 20 mg by mouth 2 (two) times daily.    Marland Kitchen linagliptin (TRADJENTA) 5 MG TABS tablet Take 5 mg by mouth daily.    Marland Kitchen MOVANTIK 25 MG TABS tablet Take 25 mg by mouth daily.    . nicotine (NICODERM CQ - DOSED IN MG/24 HOURS) 21 mg/24hr patch Place 1 patch (21 mg total) onto the skin daily. 28 patch 0  . Oxycodone HCl 10 MG TABS Take 1 tablet (10 mg total) by mouth 4 (four) times daily as needed (pain).  0  . OXYCONTIN 30 MG 12 hr tablet Take 30 mg by mouth 2 (two) times daily.    . pioglitazone (ACTOS) 45 MG tablet Take 45 mg by mouth daily.    . rizatriptan (MAXALT-MLT) 10 MG disintegrating tablet Take 10 mg by mouth as needed for migraine. May repeat in 2 hours if needed    . simvastatin (ZOCOR) 40 MG tablet Take 1 tablet (40 mg total) by mouth every evening. 30 tablet 0  . Vitamin D, Ergocalciferol, (DRISDOL) 50000 units CAPS capsule Take 50,000 Units by mouth every 7 (seven) days. On Wednesday    . WELCHOL 625 MG tablet Take 1,875 mg by mouth 2 (two) times daily.      No current facility-administered medications on file prior to visit.     Allergies:   Allergies  Allergen Reactions  . Tramadol Nausea And Vomiting and Other (See Comments)    Increase blood  pressure  . Augmentin [Amoxicillin-Pot Clavulanate] Rash     Physical Exam  Vitals:   05/20/17 1517  BP: 123/79  Pulse: 91  Weight: (!) 321 lb (145.6 kg)  Height: 5\' 6"  (1.676 m)   Body mass index is 51.81 kg/m. No exam data present  General: well developed, pleasant middle-aged obese Caucasian female, well nourished, seated, in no evident distress Head: head normocephalic and atraumatic.   Neck: supple with no carotid or supraclavicular bruits Cardiovascular: regular rate and rhythm, no murmurs Musculoskeletal: no deformity Skin:  no rash/petichiae Vascular:  Normal pulses all extremities  Neurologic Exam Mental Status: Awake and fully alert. Oriented to place and time. Recent and remote memory intact. Attention span, concentration and fund of knowledge appropriate. Mood and affect appropriate.  Cranial Nerves: Fundoscopic exam reveals sharp disc margins. Pupils equal, briskly reactive to light. Extraocular movements full without nystagmus. Visual fields full to confrontation. Hearing intact. Facial sensation intact. Face, tongue, palate moves normally and symmetrically.  Motor: Normal bulk and tone. Normal strength in all tested extremity muscles.  Mild chronic decreased sensation right hemiparesis  Sensory.:  Decreased sensation to temperature, vibratory and pinprick distally on both right upper and lower extremity Coordination: Rapid alternating movements normal in all extremities. Finger-to-nose and heel-to-shin performed accurately bilaterally.  Left hand orbital right hand. Gait and Station: Arises from chair without difficulty. Stance is normal. Gait demonstrates normal stride length and balance .  Patient currently sitting in wheelchair and is unable to ambulate long distances.  She is able to ambulate short distances without use of cane and at times walker. Reflexes: 1+ and symmetric. Toes downgoing.    NIHSS  1 Modified Rankin  2   Diagnostic Data (Labs, Imaging,  Testing)  CT HEAD WO CONTRAST Study date: 04/07/2017 IMPRESSION:  1. Chronic small vessel disease in the left corona radiata and basal ganglia appears stable since 2014. 2. Stable and normal noncontrast CT appearance of the brain elsewhere. 3. ASPECTS is 10. 4. These results were communicated to Dr. Cheral Marker at [facility] 3/26/2019by text page via the [facility] messaging system.  MR BRAIN WO CONTRAST Study date: 04/07/2017 IMPRESSION: 1. No acute finding. 2. Chronic small vessel ischemia. Remote left corona radiata perforator infarct.  CT ANGIO NECK W OR WO CONTRAST CT ANGIO HEAD W OR WO CONTRAST Study date: 04/07/2017 IMPRESSION: 1. Overall mild atherosclerosis in the head and neck without acute finding or flow limiting stenosis. 2. Multiple bilateral subcentimeter pulmonary nodules. Multiple pulmonary nodules and recommended follow-up chest CT were described on a [facility] chest CT report June 2017. Recommend PCP follow-up to ensure appropriate imaging has been obtained.  ECHO COMPLETE Study date: 04/08/2017 Impressions: - Normal LV size with EF 60-65%. Normal RV size and systolic   function. No significant valvular abnormalities    ASSESSMENT: [name] is a 52 y.o. year old female here with complicated migraine versus TIA on 04/07/2017. Vascular risk factors include HTN, HLD, smoking and previous stroke.    PLAN: -Continue clopidogrel 75 mg daily  and Zocor for secondary stroke prevention -Referral placed for sleep study -Increase Topamax from 50 mg twice daily 100 mg twice daily due to continuation of headaches -Increase gabapentin to 2400 mg total daily for increased neuropathy pain -Continue home PT -Advised patient to no longer use BC powder as this can cause rebound headaches.  Advised her to use Tylenol if needed for breakthrough pain -Advised patient to perform stress relaxation techniques -F/u with PCP regarding your HLD, HTN and DM management -continue to monitor BP  at home -Maintain strict control of hypertension with blood pressure goal below 130/90, diabetes with hemoglobin A1c goal below 6.5% and cholesterol with LDL cholesterol (bad cholesterol) goal below 70 mg/dL. I also advised the patient to eat a healthy diet with plenty of whole grains, cereals, fruits and vegetables, exercise regularly and maintain ideal body weight.  Follow up in 1 month or call earlier if needed  Greater than 50% time during this 25 minute consultation visit was spent on counseling and coordination of care about HTN, DM and HLD, discussion about risk benefit of anticoagulation and answering questions.   Venancio Poisson, AGNP-BC  [facility] [address] [place], [place] [address]  Phone [phone] Fax [phone]  I reviewed the above note and documentation by the Nurse Practitioner and agree with the history, physical exam, assessment and plan as outlined above. I was immediately available for face-to-face consultation. Star Age, MD, PhD [facility])

## 2017-05-20 NOTE — Patient Instructions (Signed)
Continue clopidogrel 75 mg daily  and zocor  for secondary stroke prevention  Continue to follow up with PCP regarding HLD, DM and HTN management   Referral for sleep study - we will call you to set up an appointment  Increase topamax from 50mg  twice a day to 100mg  twice a day  Increase Neurontin from 600mg  3 times per day to 600mg  4 times per day  Practice relaxation techniques  Continue to monitor blood pressure at home  Maintain strict control of hypertension with blood pressure goal below 130/90, diabetes with hemoglobin A1c goal below 6.5% and cholesterol with LDL cholesterol (bad cholesterol) goal below 70 mg/dL. I also advised the patient to eat a healthy diet with plenty of whole grains, cereals, fruits and vegetables, exercise regularly and maintain ideal body weight.  Followup in the future with me in 1 month or call earlier if needed       Thank you for coming to see [place] at [facility]. I hope we have been able to provide you high quality care today.  You may receive a patient satisfaction survey over the next few weeks. We would appreciate your feedback and comments so that we may continue to improve ourselves and the health of our patients.

## 2017-06-22 ENCOUNTER — Ambulatory Visit (INDEPENDENT_AMBULATORY_CARE_PROVIDER_SITE_OTHER): Payer: Medicare Other | Admitting: Adult Health

## 2017-06-22 ENCOUNTER — Encounter: Payer: Self-pay | Admitting: Adult Health

## 2017-06-22 VITALS — BP 124/78 | HR 84 | Ht 66.0 in | Wt 320.8 lb

## 2017-06-22 DIAGNOSIS — G459 Transient cerebral ischemic attack, unspecified: Secondary | ICD-10-CM

## 2017-06-22 DIAGNOSIS — G43109 Migraine with aura, not intractable, without status migrainosus: Secondary | ICD-10-CM

## 2017-06-22 DIAGNOSIS — E1159 Type 2 diabetes mellitus with other circulatory complications: Secondary | ICD-10-CM

## 2017-06-22 DIAGNOSIS — Z720 Tobacco use: Secondary | ICD-10-CM | POA: Diagnosis not present

## 2017-06-22 DIAGNOSIS — E785 Hyperlipidemia, unspecified: Secondary | ICD-10-CM | POA: Diagnosis not present

## 2017-06-22 NOTE — Progress Notes (Signed)
I agree with the above plan 

## 2017-06-22 NOTE — Progress Notes (Signed)
[facility] [address] [place]. [place] [address] [phone]       OFFICE FOLLOW UP NOTE  Ms. [name] Date of Birth:  1965/06/06 Medical Record Number:  [id number]   Reason for Referral:  hospital TIA vs complicated migraine follow up  CHIEF COMPLAINT:  Chief Complaint  Patient presents with  . Follow-up    Sroke follow up, pt is with Sonia Side husband, Heaven neice     HPI: [name] is being seen today for initial visit in the office for TIA vs complicated migraine on 0/08/67. History obtained from patient, husband and chart review. Reviewed all radiology images and labs personally.  [name] is a 52 y.o. female with history of stroke 5 yrs ago w/ resultant RLE weakness, dysarthria and sensory deficit, DM, and OSA, presenting with right facial paresthesia with sensory numbness, in conjunction with worsening of her baseline RUE paresthesia, numbness and weakness and facial droop. She did not receive IV t-PA.  CT head reviewed and did show small vessel disease but negative for acute abnormality.  MRI head reviewed and showed no acute stroke.  CTA head and neck showed mild atherosclerosis and multiple bilateral subcentimeter pulmonary nodules.  2D echo showed an EF of 60 to 65%.  LDL 67 and A1c 8.4.  Patient was on Plavix 75 mg PTA and recommended to be discharged on Plavix.  Recommended to continue Zocor 40 mg for cholesterol management.  Discussed better home control for uncontrolled diabetes.  This current episode most likely due to complicated migraine versus recurrence of old stroke in the setting of severe headache.  Patient did have migraine features, and will need preventative medication due to frequency of headache episodes.  Recommended Topamax 50 mg twice daily.  Patient discharged home in stable condition.   05/20/17 visit: Since discharge, she has been continuing to have daily headaches.  She was recently prescribed Maxalt by her PCP and has  taken approximately 4 in the past month.  She has been compliant with Topamax taking 50 mg twice a day.  She does however use BC powder 2 times per day.  Headaches are accompanied by photophobia, phonophobia, blurry vision and nausea/vomiting.  These headaches are debilitating to patient as she has to lay down in a dark room and close her eyes.  Patient does have a history of migraines as a teenager but states she has not been having an issue with these in the past 5 years.  For the past month, these headaches have been daily and getting worse.  Per patient, insurance is refusing to continue to pay for gabapentin as she was previously on 600 mg 4 times daily.  PCP recently cut this back to 1200 mg total.  Per patient, her diabetic neuropathy has worsened where she has increased in pain, numbness and tingling.  She has been receiving home PT.  She is currently sitting in a wheelchair she is unable to ambulate far distances due to residual right hemiparesis from previous stroke in 2014.  Patient has not undergone sleep study to assess for OSA at this time but is interested in having a sleep study done.  Patient does admit to snoring, daytime fatigue, apneic episodes heard by partner, obese, smoker and insomnia.   06/22/17 UPDATE: Patient returns today for one-month follow-up.  She did increase Neurontin up to 2400 mg daily in divided doses throughout the day which did help minimize nerve pain.  Also states with increase in Topamax,  her headaches have decreased to 4 headaches per week where 1 month ago, they were daily.  Her PCP recently started her on Aimovig 1 month ago and after this appointment she will be going to pick up her second injection.  She is unsure if she had positive effect from this medication but the patient was advised that it may take a couple administrations.  Her migraines continue to consist of photophobia, phonophobia and nausea and not debilitating.  She does have occasional dull headaches on  the days that she does not have migraines but they are not debilitating.  Since previous appointment, she has used [facility] powder 2 times but only when her headache was bad enough (previous appointment, BC powder use 3 times daily).  She did schedule appointment with Dr. Brett Fairy on 07/30/2017 to be assessed for sleep apnea.  She continues to take Plavix with mild bruising but no bleeding.  Continues to take Zocor without side effects of myalgias.  Blood pressure today satisfactory 124/78.  Patient denies new or worsening stroke/TIA symptoms.    ROS:   14 system review of systems performed and negative with exception of fatigue, ear pain, trouble swallowing, light sensitivity, cough, wheezing, shortness of breath, cold intolerance, excessive thirst, snoring, joint pain, back pain, neck pain, bruise easily, memory loss, dizziness, headache, agitation, depression, and nervous/anxious  PMH:  Past Medical History:  Diagnosis Date  . Anxiety   . Cancer ([place])    a spot on liver and treated   . Complication of anesthesia    restless,easily upset  . COPD (chronic obstructive pulmonary disease) ([place])   . Diabetes mellitus without complication ([place])   . Headache(784.0)   . Restless   . Sleep apnea    neg test  . Stroke [facility])     PSH:  Past Surgical History:  Procedure Laterality Date  . ABDOMINAL HYSTERECTOMY    . ANTERIOR CERVICAL DECOMP/DISCECTOMY FUSION N/A 03/11/2012   Procedure: ANTERIOR CERVICAL DECOMPRESSION/DISCECTOMY FUSION 1 LEVEL;  Surgeon: Ophelia Charter, MD;  Location: [place] [facility];  Service: Neurosurgery;  Laterality: N/A;  Cervical five-[name] anterior cervical decompression with fusion interbody prothesis plating and bonegraft  . BACK SURGERY    . EYE SURGERY    . HERNIA REPAIR    . JOINT REPLACEMENT     bil knees  . REPLACEMENT TOTAL KNEE BILATERAL      Social History:  Social History   Socioeconomic History  . Marital status: Divorced    Spouse name: Not on file  . Number  of children: Not on file  . Years of education: Not on file  . Highest education level: Not on file  Occupational History  . Not on file  Social Needs  . Financial resource strain: Not on file  . Food insecurity:    Worry: Not on file    Inability: Not on file  . Transportation needs:    Medical: Not on file    Non-medical: Not on file  Tobacco Use  . Smoking status: Current Every Day Smoker    Packs/day: 0.50    Years: 20.00    Pack years: 10.00    Types: Cigarettes  . Smokeless tobacco: Never Used  Substance and Sexual Activity  . Alcohol use: No  . Drug use: Yes    Types: Marijuana    Comment: occasionally  . Sexual activity: Not on file  Lifestyle  . Physical activity:    Days per week: Not on file  Minutes per session: Not on file  . Stress: Not on file  Relationships  . Social connections:    Talks on phone: Not on file    Gets together: Not on file    Attends religious service: Not on file    Active member of club or organization: Not on file    Attends meetings of clubs or organizations: Not on file    Relationship status: Not on file  . Intimate partner violence:    Fear of current or ex partner: Not on file    Emotionally abused: Not on file    Physically abused: Not on file    Forced sexual activity: Not on file  Other Topics Concern  . Not on file  Social History Narrative  . Not on file    Family History:  Family History  Problem Relation Age of Onset  . Diabetes Mother   . Hypertension Mother   . Hypertension Father     Medications:   Current Outpatient Medications on File Prior to Visit  Medication Sig Dispense Refill  . ALPRAZolam (XANAX) 1 MG tablet Take 1 mg by mouth 3 (three) times daily as needed for anxiety.     Marland Kitchen BYDUREON BCISE 2 MG/0.85ML AUIJ Inject 2 mg into the skin once a week. On Thursday    . citalopram (CELEXA) 40 MG tablet Take 40 mg by mouth daily.    . clopidogrel (PLAVIX) 75 MG tablet Take 75 mg by mouth daily.    Marland Kitchen  dicyclomine (BENTYL) 20 MG tablet Take 20 mg by mouth 2 (two) times daily.    Eduard Roux (AIMOVIG) 70 MG/ML SOAJ Inject 70 mg into the skin.    . furosemide (LASIX) 40 MG tablet Take 40 mg by mouth daily.     Marland Kitchen gabapentin (NEURONTIN) 600 MG tablet Take 1 tablet (600 mg total) by mouth 4 (four) times daily. 360 tablet 3  . insulin aspart (NOVOLOG) 100 UNIT/ML FlexPen Inject 30 Units into the skin at bedtime.    Marland Kitchen linagliptin (TRADJENTA) 5 MG TABS tablet Take 5 mg by mouth daily.    Marland Kitchen nystatin (MYCOSTATIN/NYSTOP) powder     . Oxycodone HCl 10 MG TABS Take 1 tablet (10 mg total) by mouth 4 (four) times daily as needed (pain).  0  . OXYCONTIN 30 MG 12 hr tablet Take 30 mg by mouth 2 (two) times daily.    . pantoprazole (PROTONIX) 20 MG tablet     . pioglitazone (ACTOS) 45 MG tablet Take 45 mg by mouth daily.    . rizatriptan (MAXALT-MLT) 10 MG disintegrating tablet Take 10 mg by mouth as needed for migraine. May repeat in 2 hours if needed    . simvastatin (ZOCOR) 40 MG tablet Take 1 tablet (40 mg total) by mouth every evening. 30 tablet 0  . topiramate (TOPAMAX) 100 MG tablet Take 1 tablet (100 mg total) by mouth 2 (two) times daily. 180 tablet 4  . VENTOLIN HFA 108 (90 Base) MCG/ACT inhaler     . Vitamin D, Ergocalciferol, (DRISDOL) 50000 units CAPS capsule Take 50,000 Units by mouth every 7 (seven) days. On Wednesday    . WELCHOL 625 MG tablet Take 1,875 mg by mouth 2 (two) times daily.      No current facility-administered medications on file prior to visit.     Allergies:   Allergies  Allergen Reactions  . Tramadol Nausea And Vomiting and Other (See Comments)    Increase blood pressure  .  Augmentin [Amoxicillin-Pot Clavulanate] Rash     Physical Exam  Vitals:   06/22/17 1433  BP: 124/78  Pulse: 84  Weight: (!) 320 lb 12.8 oz (145.5 kg)  Height: 5\' 6"  (1.676 m)   Body mass index is 51.78 kg/m. No exam data present  General: well developed, pleasant middle-aged obese  Caucasian female, well nourished, seated, in no evident distress Head: head normocephalic and atraumatic.   Neck: supple with no carotid or supraclavicular bruits Cardiovascular: regular rate and rhythm, no murmurs Musculoskeletal: no deformity Skin:  no rash/petichiae Vascular:  Normal pulses all extremities  Neurologic Exam Mental Status: Awake and fully alert. Oriented to place and time. Recent and remote memory intact. Attention span, concentration and fund of knowledge appropriate. Mood and affect appropriate.  Cranial Nerves: Fundoscopic exam reveals sharp disc margins. Pupils equal, briskly reactive to light. Extraocular movements full without nystagmus. Visual fields full to confrontation. Hearing intact. Facial sensation intact. Face, tongue, palate moves normally and symmetrically.  Motor: Normal bulk and tone. Normal strength in all tested extremity muscles.  Mild chronic right hemiparesis  Sensory.:  Decreased sensation to temperature, vibratory and pinprick distally on both right upper and lower extremity Coordination: Rapid alternating movements normal in all extremities. Finger-to-nose and heel-to-shin performed accurately bilaterally.  Left hand orbital right hand. Gait and Station: Arises from chair without difficulty. Stance is normal. Gait demonstrates normal stride length and balance .  Patient currently sitting in wheelchair and is unable to ambulate long distances.  She is able to ambulate short distances without use of cane and at times walker. Reflexes: 1+ and symmetric. Toes downgoing.     Diagnostic Data (Labs, Imaging, Testing)  CT HEAD WO CONTRAST Study date: 04/07/2017 IMPRESSION: 1. Chronic small vessel disease in the left corona radiata and basal ganglia appears stable since 2014. 2. Stable and normal noncontrast CT appearance of the brain elsewhere. 3. ASPECTS is 10. 4. These results were communicated to Dr. Cheral Marker at [place] 3/26/2019by text page via  the [facility] messaging system.  MR BRAIN WO CONTRAST Study date: 04/07/2017 IMPRESSION: 1. No acute finding. 2. Chronic small vessel ischemia. Remote left corona radiata perforator infarct.  CT ANGIO NECK W OR WO CONTRAST CT ANGIO HEAD W OR WO CONTRAST Study date: 04/07/2017 IMPRESSION: 1. Overall mild atherosclerosis in the head and neck without acute finding or flow limiting stenosis. 2. Multiple bilateral subcentimeter pulmonary nodules. Multiple pulmonary nodules and recommended follow-up chest CT were described on a [facility] chest CT report June 2017. Recommend PCP follow-up to ensure appropriate imaging has been obtained.  ECHO COMPLETE Study date: 04/08/2017 Impressions: - Normal LV size with EF 60-65%. Normal RV size and systolic   function. No significant valvular abnormalities    ASSESSMENT: [name] is a 52 y.o. year old female here with complicated migraine versus TIA on 04/07/2017. Vascular risk factors include HTN, HLD, smoking and previous stroke.    PLAN: -Continue clopidogrel 75 mg daily  and Zocor for secondary stroke prevention -f/u with Dr. Brett Fairy on 07/30/2017 for work-up of possible OSA -requested patient be placed on cancellation list -Continue current doses of Topamax and gabapentin for neuropathy and migraines -Continued to advised to avoid BC powder and use Tylenol as needed for breakthrough migraine -Advised patient to perform stress relaxation techniques -F/u with PCP regarding your HLD, HTN and DM management -continue to monitor BP at home -Maintain strict control of hypertension with blood pressure goal below 130/90, diabetes with hemoglobin A1c goal below  6.5% and cholesterol with LDL cholesterol (bad cholesterol) goal below 70 mg/dL. I also advised the patient to eat a healthy diet with plenty of whole grains, cereals, fruits and vegetables, exercise regularly and maintain ideal body weight.  Follow up as needed as patient will be following with Dr.  Brett Fairy or call earlier if needed  Greater than 50% time during this 25 minute consultation visit was spent on counseling and coordination of care about HTN, DM and HLD, discussion about risk benefit of anticoagulation and answering questions.   Venancio Poisson, AGNP-BC  [facility] [address] [place], [place] [address]  Phone [phone] Fax [phone]

## 2017-06-22 NOTE — Patient Instructions (Signed)
Continue clopidogrel 75 mg daily  and Zocor  for secondary stroke prevention  Continue to follow up with PCP regarding migraines,  HLD, DM and HTN management   Continue current dose of topamax and gabapentin for migraines and neuropathy pain  Continue to avoid use of BC powder  Follow up appointment with Dr. Brett Fairy 07/30/16 for sleep study consult  Continue to monitor blood pressure at home  Maintain strict control of hypertension with blood pressure goal below 130/90, diabetes with hemoglobin A1c goal below 6.5% and cholesterol with LDL cholesterol (bad cholesterol) goal below 70 mg/dL. I also advised the patient to eat a healthy diet with plenty of whole grains, cereals, fruits and vegetables, exercise regularly and maintain ideal body weight.  Followup in the future with me as needed or call earlier if needed          Thank you for coming to see [place] at [facility]. I hope we have been able to provide you high quality care today.  You may receive a patient satisfaction survey over the next few weeks. We would appreciate your feedback and comments so that we may continue to improve ourselves and the health of our patients.

## 2017-07-09 ENCOUNTER — Institutional Professional Consult (permissible substitution): Payer: Medicare Other | Admitting: Neurology

## 2017-07-29 ENCOUNTER — Other Ambulatory Visit: Payer: Self-pay | Admitting: Family Medicine

## 2017-07-29 DIAGNOSIS — Z1231 Encounter for screening mammogram for malignant neoplasm of breast: Secondary | ICD-10-CM

## 2017-07-30 ENCOUNTER — Encounter: Payer: Self-pay | Admitting: Neurology

## 2017-07-30 ENCOUNTER — Ambulatory Visit (INDEPENDENT_AMBULATORY_CARE_PROVIDER_SITE_OTHER): Payer: Medicare Other | Admitting: Neurology

## 2017-07-30 ENCOUNTER — Encounter

## 2017-07-30 VITALS — BP 139/84 | HR 85 | Ht 66.0 in | Wt 309.0 lb

## 2017-07-30 DIAGNOSIS — Z720 Tobacco use: Secondary | ICD-10-CM

## 2017-07-30 DIAGNOSIS — I639 Cerebral infarction, unspecified: Secondary | ICD-10-CM

## 2017-07-30 DIAGNOSIS — G4733 Obstructive sleep apnea (adult) (pediatric): Secondary | ICD-10-CM | POA: Diagnosis not present

## 2017-07-30 DIAGNOSIS — E662 Morbid (severe) obesity with alveolar hypoventilation: Secondary | ICD-10-CM

## 2017-07-30 DIAGNOSIS — Z9989 Dependence on other enabling machines and devices: Secondary | ICD-10-CM

## 2017-07-30 DIAGNOSIS — G459 Transient cerebral ischemic attack, unspecified: Secondary | ICD-10-CM

## 2017-07-30 DIAGNOSIS — G4734 Idiopathic sleep related nonobstructive alveolar hypoventilation: Secondary | ICD-10-CM

## 2017-07-30 DIAGNOSIS — G43611 Persistent migraine aura with cerebral infarction, intractable, with status migrainosus: Secondary | ICD-10-CM | POA: Diagnosis not present

## 2017-07-30 NOTE — Patient Instructions (Signed)
Obesity Hypoventilation Syndrome Obesity hypoventilation syndrome (OHS) means that you are not breathing well enough to get air in and out of your lungs efficiently (ventilation). This causes a low oxygen level and a high carbon dioxide level in your blood (hypoventilation). Having too much total body fat (obesity) is a significant risk factor for developing OHS. OHS makes it harder for your heart to pump oxygen-rich blood to your body. It can cause sleep disturbances and make you feel sleepy during the day. Over time, OHS can increase your risk for:  Heart disease.  High blood pressure (hypertension).  Reduced ability to absorb sugar from the bloodstream (insulin resistance).  Heart failure. Over time, OHS weakens your heart and can lead to heart failure.  What are the causes? The exact cause of OHS is not known. Possible causes include:  Pressure on the lungs from excess body weight.  Obesity-related changes in how much air the lungs can hold (lung capacity) and how much they can expand (lung compliance).  Failure of the brain to regulate oxygen and carbon dioxide levels properly.  Chemicals (hormones) produced by excess fat cells interfering with breathing regulation.  A breathing condition in which breathing pauses or becomes shallow during sleep (sleep apnea). This condition can eventually cause the body to ventilate poorly and to hold onto carbon dioxide during the day.  What increases the risk? You may have a greater risk for OHS if you:  Have a BMI of 30 or higher. BMI is an estimate of body fat that is calculated from height and weight. For adults, a BMI of 30 or higher is considered obese.  Are 40?54 years old.  Carry most of your excess weight around your waist.  Experience moderate symptoms of sleep apnea.  What are the signs or symptoms? The most common symptoms of OHS are:  Daytime sleepiness.  Lack of energy.  Shortness of breath.  Morning  headaches.  Sleep apnea.  Trouble concentrating.  Irritability, mood swings, or depression.  Swollen veins in the neck.  Swelling of the legs.  How is this diagnosed? Your health care provider may suspect OHS if you are obese and have poor breathing during the day and at night. Your health care provider will also do a physical exam. You may have tests to:  Measure your BMI.  Measure your blood oxygen level with a sensor placed on your finger (pulse oximetry).  Measure blood oxygen and carbon dioxide in a blood sample.  Measure the amount of red blood cells in a blood sample. OHS causes the number of red blood cells you have to increase (polycythemia).  Check your breathing ability (pulmonary function testing).  Check your breathing ability, breathing patterns, and oxygen level while you sleep (sleep study).  You may also have a chest X-ray to rule out other breathing problems. You may have an electrocardiogram (ECG) and or echocardiogram to check for signs of heart failure. How is this treated? Weight loss is the most important part of treatment for OHS, and it may be the only treatment that you need. Other treatments may include:  Using a device to open your airway while you sleep, such as a continuous positive airway pressure (CPAP) machine that delivers oxygen to your airway through a mask.  Surgery (gastric bypass surgery) to lower your BMI. This may be needed if: ? You are very obese. ? Other treatments have not worked for you. ? Your OHS is very severe and is causing organ damage, such as   heart failure.  Follow these instructions at home: Medicines  Take over-the-counter and prescription medicines only as told by your health care provider.  Ask your health care provider what medicines are safe for you. You may be told to avoid medicines that can impair breathing and make OHS worse, such as sedatives and narcotics. Sleeping habits  If you are prescribed a CPAP  machine, make sure you understand and use the machine as directed.  Try to get 8 hours of sleep every night.  Go to bed at the same time every night, and get up at the same time every day. General instructions  Work with your health care provider to make a diet and exercise plan that helps you reach and maintain a healthy weight.  Eat a healthy diet.  Avoid smoking.  Exercise regularly as told by your health care provider.  During the evening, do not drink caffeine and do not eat heavy meals.  Keep all follow-up visits as told by your health care provider. This is important. Contact a health care provider if:   You experience new or worsening shortness of breath.  You have chest pain.  You have an irregular heartbeat (palpitations).  You have dizziness.  You faint.  You develop a cough.  You have a fever.  You have chest pain when you breathe (pleurisy). This information is not intended to replace advice given to you by your health care provider. Make sure you discuss any questions you have with your health care provider. Document Released: 06/11/2015 Document Revised: 07/20/2015 Document Reviewed: 06/11/2015 [employer] Interactive Patient Education  2018 [employer]  

## 2017-07-30 NOTE — Progress Notes (Signed)
SLEEP MEDICINE CLINIC   Provider:  Larey Seat, M.D.   Primary Care Physician:  Remi Haggard, FNP   Referring Provider: Antony Contras, MD and Venancio Poisson ,NP.   Chief Complaint  Patient presents with  . New Patient (Initial Visit)    RM 59, with husband. pt has had a sleep study many years ago but has never had a cpap. pt endorses snoring.    HPI:  [name] is a 52 y.o. female , seen here  in a referral from Dr. Leonie Man for sleep related headaches, super-obeisty, tobacco abuse while carrying a diagnosis of COPD.  The patient has suffered a second  Stroke in March / 2019 without new deficits. Lives with her partner- witnessed her stopping breathing, wheezing, snoring.   Dr Leonie Man considered the patient suffering complicated Migraine , here is his assessment.    CHIEF COMPLAINT:  Chief Complaint  Patient presents with  . Follow-up    Sroke follow up, pt is with Sonia Side husband, Heaven neice     HPI: [name] is being seen today for initial visit in the office for TIA vs complicated migraine on 1/61/09. History obtained from patient, husband and chart review. Reviewed all radiology images and labs personally.  [name] is a 52 y.o. female with history of stroke 5 yrs ago w/ resultant RLE weakness, dysarthria and sensory deficit, DM, and OSA, presenting with right facial paresthesia with sensory numbness, in conjunction with worsening of her baseline RUE paresthesia, numbness and weakness and facial droop. She did not receive IV t-PA.  CT head reviewed and did show small vessel disease but negative for acute abnormality.  MRI head reviewed and showed no acute stroke.  CTA head and neck showed mild atherosclerosis and multiple bilateral subcentimeter pulmonary nodules.  2D echo showed an EF of 60 to 65%.  LDL 67 and A1c 8.4.  Patient was on Plavix 75 mg PTA and recommended to be discharged on Plavix.  Recommended to continue Zocor 40 mg for cholesterol  management.  Discussed better home control for uncontrolled diabetes.  This current episode most likely due to complicated migraine versus recurrence of old stroke in the setting of severe headache.  Patient did have migraine features, and will need preventative medication due to frequency of headache episodes.  Recommended Topamax 50 mg twice daily.  Patient discharged home in stable condition.   05/20/17 visit: Since discharge, she has been continuing to have daily headaches.  She was recently prescribed Maxalt by her PCP and has taken approximately 4 in the past month.  She has been compliant with Topamax taking 50 mg twice a day.  She does however use BC powder 2 times per day.  Headaches are accompanied by photophobia, phonophobia, blurry vision and nausea/vomiting.  These headaches are debilitating to patient as she has to lay down in a dark room and close her eyes.  Patient does have a history of migraines as a teenager but states she has not been having an issue with these in the past 5 years.  For the past month, these headaches have been daily and getting worse.  Per patient, insurance is refusing to continue to pay for gabapentin as she was previously on 600 mg 4 times daily.  PCP recently cut this back to 1200 mg total.  Per patient, her diabetic neuropathy has worsened where she has increased in pain, numbness and tingling.  She has been receiving home PT.  She is  currently sitting in a wheelchair she is unable to ambulate far distances due to residual right hemiparesis from previous stroke in 2014.  Patient has not undergone sleep study to assess for OSA at this time but is interested in having a sleep study done.  Patient does admit to snoring, daytime fatigue, apneic episodes heard by partner, obese, smoker and insomnia in the setting of CVA, DM, HTN and deprssion/ anxiety.    .  Sleep habits are as follows: no bedtime routine, sleep hygiene.  She may go to bed after 4 AM and other nights lays  awake for hours- chronic insomnia related to chronic pain.  The patient reports that her insomnia slowly progressed over the last for 5 years and has gotten worse.  She feels that she does not get enough sleep as she often initiate sleep so late in the morning hours that she will be up again after only for 5 hours of rest. Is there is a TV in the bedroom and the patient also states this was a current warm weather the bedroom is not nearly as cool as she likes it.  Once the TV is off she would consider the bedroom more conducive to sleep being quiet and dark.  She sleeps supine.  With one pillow for head support.  She uses an adjustable hospital bed.  She keeps the bed flat to be able to turn from side to side.  She reports a lot of pain and discomfort and pressure on her hips back and joints.  Sometimes she is able to sleep headaches all other times she will wake up with a headache or still carries a same headache she went to bed with.  The headache is worse in the morning and slightly improved as the day goes on.  There is a high suspicion that this could be a CO2 retention headache based on this prescription.  She had a proven cerebral infarction by MRI and status migrainosus.  Sleep medical history and family sleep history:  COPD, super obese, onh going smoking, vascular disease, CVA, DM and anxiety/ depression.    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  Lives with her partner- witnessed her stopping breathing, wheezing, snoring.    Review of Systems: Out of a complete 14 system review, the patient complains of only the following symptoms, and all other reviewed systems are negative.  Apnea, sleep, headaches,    Epworth score 2 , Fatigue severity score 33 , depression score N/a   Social History   Socioeconomic History  . Marital status: Divorced    Spouse name: Not on  file  . Number of children: Not on file  . Years of education: Not on file  . Highest education level: Not on file  Occupational History  . Not on file  Social Needs  . Financial resource strain: Not on file  . Food insecurity:    Worry: Not on file    Inability: Not on file  . Transportation needs:    Medical: Not on file    Non-medical: Not on file  Tobacco Use  . Smoking status: Current Every Day Smoker    Packs/day: 0.50    Years: 20.00    Pack years: 10.00    Types: Cigarettes  . Smokeless tobacco: Never Used  Substance and Sexual Activity  . Alcohol use: No  .  Drug use: Yes    Types: Marijuana    Comment: occasionally  . Sexual activity: Not on file  Lifestyle  . Physical activity:    Days per week: Not on file    Minutes per session: Not on file  . Stress: Not on file  Relationships  . Social connections:    Talks on phone: Not on file    Gets together: Not on file    Attends religious service: Not on file    Active member of club or organization: Not on file    Attends meetings of clubs or organizations: Not on file    Relationship status: Not on file  . Intimate partner violence:    Fear of current or ex partner: Not on file    Emotionally abused: Not on file    Physically abused: Not on file    Forced sexual activity: Not on file  Other Topics Concern  . Not on file  Social History Narrative  . Not on file    Family History  Problem Relation Age of Onset  . Diabetes Mother   . Hypertension Mother   . Hypertension Father     Past Medical History:  Diagnosis Date  . Anxiety   . Cancer ([place])    a spot on liver and treated   . Complication of anesthesia    restless,easily upset  . COPD (chronic obstructive pulmonary disease) ([place])   . Diabetes mellitus without complication ([place])   . Headache(784.0)   . Restless   . Sleep apnea    neg test  . Stroke [facility])     Past Surgical History:  Procedure Laterality Date  . ABDOMINAL HYSTERECTOMY      . ANTERIOR CERVICAL DECOMP/DISCECTOMY FUSION N/A 03/11/2012   Procedure: ANTERIOR CERVICAL DECOMPRESSION/DISCECTOMY FUSION 1 LEVEL;  Surgeon: Ophelia Charter, MD;  Location: [place] [facility];  Service: Neurosurgery;  Laterality: N/A;  Cervical five-six anterior cervical decompression with fusion interbody prothesis plating and bonegraft  . BACK SURGERY    . EYE SURGERY    . HERNIA REPAIR    . JOINT REPLACEMENT     bil knees  . REPLACEMENT TOTAL KNEE BILATERAL      Current Outpatient Medications  Medication Sig Dispense Refill  . ALPRAZolam (XANAX) 1 MG tablet Take 1 mg by mouth 3 (three) times daily as needed for anxiety.     Marland Kitchen BYDUREON BCISE 2 MG/0.85ML AUIJ Inject 2 mg into the skin once a week. On Thursday    . citalopram (CELEXA) 40 MG tablet Take 40 mg by mouth daily.    . clopidogrel (PLAVIX) 75 MG tablet Take 75 mg by mouth daily.    Marland Kitchen dicyclomine (BENTYL) 20 MG tablet Take 20 mg by mouth 2 (two) times daily.    Eduard Roux (AIMOVIG) 70 MG/ML SOAJ Inject 70 mg into the skin.    . furosemide (LASIX) 40 MG tablet Take 40 mg by mouth daily.     Marland Kitchen gabapentin (NEURONTIN) 600 MG tablet Take 1 tablet (600 mg total) by mouth 4 (four) times daily. 360 tablet 3  . insulin aspart (NOVOLOG) 100 UNIT/ML FlexPen Inject 30 Units into the skin at bedtime.    Marland Kitchen linagliptin (TRADJENTA) 5 MG TABS tablet Take 5 mg by mouth daily.    Marland Kitchen nystatin (MYCOSTATIN/NYSTOP) powder     . Oxycodone HCl 10 MG TABS Take 1 tablet (10 mg total) by mouth 4 (four) times daily as needed (pain).  0  .  OXYCONTIN 30 MG 12 hr tablet Take 30 mg by mouth 2 (two) times daily.    . pantoprazole (PROTONIX) 20 MG tablet     . pioglitazone (ACTOS) 45 MG tablet Take 45 mg by mouth daily.    . rizatriptan (MAXALT-MLT) 10 MG disintegrating tablet Take 10 mg by mouth as needed for migraine. May repeat in 2 hours if needed    . simvastatin (ZOCOR) 40 MG tablet Take 1 tablet (40 mg total) by mouth every evening. 30 tablet 0  .  topiramate (TOPAMAX) 100 MG tablet Take 1 tablet (100 mg total) by mouth 2 (two) times daily. 180 tablet 4  . VENTOLIN HFA 108 (90 Base) MCG/ACT inhaler     . Vitamin D, Ergocalciferol, (DRISDOL) 50000 units CAPS capsule Take 50,000 Units by mouth every 7 (seven) days. On Wednesday    . WELCHOL 625 MG tablet Take 1,875 mg by mouth 2 (two) times daily.      No current facility-administered medications for this visit.     Allergies as of 07/30/2017 - Review Complete 07/30/2017  Allergen Reaction Noted  . Tramadol Nausea And Vomiting and Other (See Comments) 10/11/2012  . Augmentin [amoxicillin-pot clavulanate] Rash 07/01/2016    Vitals: BP 139/84   Pulse 85   Ht 5\' 6"  (1.676 m)   Wt (!) 309 lb (140.2 kg)   BMI 49.87 kg/m  Last Weight:  Wt Readings from Last 1 Encounters:  07/30/17 (!) 309 lb (140.2 kg)   NFA:OZHY mass index is 49.87 kg/m.     Last Height:   Ht Readings from Last 1 Encounters:  07/30/17 5\' 6"  (1.676 m)    Physical exam:  General: The patient is awake, alert and appears not in acute distress. The patient is not well  groomed. Head: Normocephalic, atraumatic. Neck is supple. Mallampati 5- poor dentition,  neck circumference:17.5. Nasal airflow patent , hoarse ,  Retrognathia is seen.  Cardiovascular:  Regular rate and rhythm , without  murmurs or carotid bruit, and without distended neck veins. Respiratory: Lungs are clear to auscultation. Skin:  Without evidence of edema, or rash Trunk: BMI is 50. The patient is seated in a wheelchair.   Neurologic exam : The patient is awake and alert, oriented to place and time.    Attention span & concentration ability appears normal.  Speech is fluent,  with dysarthria, dysphonia or aphasia.  Mood and affect are appropriate.  Cranial nerves: Pupils are equal and briskly reactive to light. Funduscopic exam without  evidence of pallor or edema. blurred vision with migraine.  Extraocular movements  in vertical and  horizontal planes intact and without nystagmus.  Visual fields by finger perimetry are intact. Hearing to finger rub intact.   Facial sensation intact to fine touch.  Facial motor strength is symmetric and tongue  midline. Shoulder shrug was symmetrical.   Gait and station: Patient is seated in a wheelchair.  Deep tendon reflexes: in the upper and lower extremities are attenuated.  Babinski maneuver response is equivocal    Assessment:  After physical and neurologic examination, review of laboratory studies,  Personal review of imaging studies, reports of other /same  Imaging studies, results of polysomnography and / or neurophysiology testing and pre-existing records as far as provided in visit., my assessment is   1) reviewing Mrs. cheeks past medical history and current list of diagnoses I have to agree that she is at high risk of having obstructive sleep apnea.  Obstructive sleep apnea with hypercapnia  and hypoxemia is suspected, given that she has an established diagnosis of COPD and likely a component of obesity hypoventilation.  She has been witnessed to snore and to pause in her breathing at night.  She wakes up with dull headaches that were not present and she went to bed and then alleviate  during the day. She has suffered a stroke documented by brain imaging.  Daily headaches were initially a complaint that these have been a little bit improved on the topiramate, however she reminds this restless legs, diabetic neuropathy, fragmented sleep numbness and tingling, the inability to initiate sleep often into the morning hours, the inability to stay asleep.     If Mrs. cifelli would be able to initiate sleep around midnight it would be better to invite her for sleep study at the sleep lab.  That would also have the benefit of giving [place] an opportunity to treat with CPAP the same night if necessary, and if CPAP would not alleviate hypoxemia and hypercapnia we could give additionally oxygen.  The  alternative would be a home sleep test test to screen her for apnea of the degree of apnea and associated hypoxemia but it would not allow me to establish a diagnosis of hypoventilation and CO2 retention.   Both are valid options of home sleep test usually is followed by a CPAP titration if apnea with hypoxemia is found.  I like for her to elevate the head of the bed just by about 10 degrees as it may help her to breathe deeper.    I think that part of the chronic insomnia is anxiety or depression related and that chronic pain certainly plays a role in her inability to sleep longer and have restorative and refreshing sleep quality.    The patient was advised of the nature of the diagnosed disorder , the treatment options and the  risks for general health and wellness arising from not treating the condition.   I spent more than 45 minutes of face to face time with the patient.  Greater than 50% of time was spent in counseling and coordination of care. We have discussed the diagnosis and differential and I answered the patient's questions.    Plan:  Treatment plan and additional workup :  Patient will come to [facility] sleep for SPLIT night PSG with capnography. A SPLI at AHI 40/h. If hypoxemia is present and not alleviated by CPAP, give oxygen.!! Patient asks for a fan to be placed in the bedroom.     Larey Seat, MD 1/61/0960, 4:54 PM  Certified in Neurology by [employer]BP[employer] Certified in [place] by [facility] [address], [place], [place] [address]

## 2017-08-07 ENCOUNTER — Ambulatory Visit (INDEPENDENT_AMBULATORY_CARE_PROVIDER_SITE_OTHER): Payer: Medicare Other | Admitting: Neurology

## 2017-08-07 DIAGNOSIS — Z9989 Dependence on other enabling machines and devices: Secondary | ICD-10-CM

## 2017-08-07 DIAGNOSIS — G4733 Obstructive sleep apnea (adult) (pediatric): Secondary | ICD-10-CM

## 2017-08-07 DIAGNOSIS — G4734 Idiopathic sleep related nonobstructive alveolar hypoventilation: Secondary | ICD-10-CM

## 2017-08-07 DIAGNOSIS — I639 Cerebral infarction, unspecified: Secondary | ICD-10-CM

## 2017-08-07 DIAGNOSIS — E662 Morbid (severe) obesity with alveolar hypoventilation: Secondary | ICD-10-CM

## 2017-08-07 DIAGNOSIS — G43611 Persistent migraine aura with cerebral infarction, intractable, with status migrainosus: Secondary | ICD-10-CM

## 2017-08-07 DIAGNOSIS — G459 Transient cerebral ischemic attack, unspecified: Secondary | ICD-10-CM

## 2017-08-07 DIAGNOSIS — Z720 Tobacco use: Secondary | ICD-10-CM

## 2017-08-08 ENCOUNTER — Inpatient Hospital Stay
Admission: EM | Admit: 2017-08-08 | Discharge: 2017-08-11 | DRG: 493 | Disposition: A | Discharge status: Home Health | Payer: Medicare Other | Attending: Internal Medicine | Admitting: Internal Medicine

## 2017-08-08 ENCOUNTER — Emergency Department: Payer: Medicare Other

## 2017-08-08 DIAGNOSIS — Z881 Allergy status to other antibiotic agents status: Secondary | ICD-10-CM

## 2017-08-08 DIAGNOSIS — T148XXA Other injury of unspecified body region, initial encounter: Secondary | ICD-10-CM

## 2017-08-08 DIAGNOSIS — S82853A Displaced trimalleolar fracture of unspecified lower leg, initial encounter for closed fracture: Secondary | ICD-10-CM | POA: Diagnosis present

## 2017-08-08 DIAGNOSIS — G43109 Migraine with aura, not intractable, without status migrainosus: Secondary | ICD-10-CM | POA: Diagnosis present

## 2017-08-08 DIAGNOSIS — Z886 Allergy status to analgesic agent status: Secondary | ICD-10-CM

## 2017-08-08 DIAGNOSIS — Z6841 Body Mass Index (BMI) 40.0 and over, adult: Secondary | ICD-10-CM

## 2017-08-08 DIAGNOSIS — E1142 Type 2 diabetes mellitus with diabetic polyneuropathy: Secondary | ICD-10-CM | POA: Diagnosis present

## 2017-08-08 DIAGNOSIS — F329 Major depressive disorder, single episode, unspecified: Secondary | ICD-10-CM | POA: Diagnosis present

## 2017-08-08 DIAGNOSIS — S82851A Displaced trimalleolar fracture of right lower leg, initial encounter for closed fracture: Secondary | ICD-10-CM | POA: Diagnosis not present

## 2017-08-08 DIAGNOSIS — F419 Anxiety disorder, unspecified: Secondary | ICD-10-CM | POA: Diagnosis present

## 2017-08-08 DIAGNOSIS — F17210 Nicotine dependence, cigarettes, uncomplicated: Secondary | ICD-10-CM | POA: Diagnosis present

## 2017-08-08 DIAGNOSIS — Z7902 Long term (current) use of antithrombotics/antiplatelets: Secondary | ICD-10-CM

## 2017-08-08 DIAGNOSIS — M25579 Pain in unspecified ankle and joints of unspecified foot: Secondary | ICD-10-CM

## 2017-08-08 DIAGNOSIS — E785 Hyperlipidemia, unspecified: Secondary | ICD-10-CM | POA: Diagnosis present

## 2017-08-08 DIAGNOSIS — Z794 Long term (current) use of insulin: Secondary | ICD-10-CM

## 2017-08-08 DIAGNOSIS — Z79899 Other long term (current) drug therapy: Secondary | ICD-10-CM

## 2017-08-08 DIAGNOSIS — I10 Essential (primary) hypertension: Secondary | ICD-10-CM | POA: Diagnosis present

## 2017-08-08 DIAGNOSIS — Z79891 Long term (current) use of opiate analgesic: Secondary | ICD-10-CM

## 2017-08-08 DIAGNOSIS — Z96653 Presence of artificial knee joint, bilateral: Secondary | ICD-10-CM | POA: Diagnosis present

## 2017-08-08 DIAGNOSIS — J449 Chronic obstructive pulmonary disease, unspecified: Secondary | ICD-10-CM | POA: Diagnosis present

## 2017-08-08 DIAGNOSIS — W010XXA Fall on same level from slipping, tripping and stumbling without subsequent striking against object, initial encounter: Secondary | ICD-10-CM | POA: Diagnosis present

## 2017-08-08 DIAGNOSIS — Z833 Family history of diabetes mellitus: Secondary | ICD-10-CM

## 2017-08-08 DIAGNOSIS — M25571 Pain in right ankle and joints of right foot: Secondary | ICD-10-CM | POA: Diagnosis not present

## 2017-08-08 DIAGNOSIS — Z8673 Personal history of transient ischemic attack (TIA), and cerebral infarction without residual deficits: Secondary | ICD-10-CM

## 2017-08-08 MED ORDER — MORPHINE SULFATE (PF) 4 MG/ML IV SOLN
4.0000 mg | Freq: Once | INTRAVENOUS | Status: AC
  Administered 2017-08-09: 4 mg via INTRAVENOUS
  Filled 2017-08-08: qty 1

## 2017-08-08 MED ORDER — ONDANSETRON HCL 4 MG/2ML IJ SOLN
4.0000 mg | Freq: Once | INTRAMUSCULAR | Status: AC
  Administered 2017-08-09: 4 mg via INTRAVENOUS
  Filled 2017-08-08: qty 2

## 2017-08-08 NOTE — ED Provider Notes (Signed)
[facility] Emergency Department Provider Note   ____________________________________________   First MD Initiated Contact with Patient 08/08/17 2334     (approximate)  I have reviewed the triage vital signs and the nursing notes.   HISTORY  Chief Complaint Ankle Injury    HPI [name] is a 52 y.o. female who comes into the hospital today with some right ankle pain and deformity.  The patient was stepping into a vehicle.  She stepped in with her left leg and was about to bring in her right leg when her leg buckled beneath her and she felt a pop.  She now has some ankle pain and deformity.  She is never had this happen before.  The patient states that she has some right leg weakness from previous stroke.  She denies hitting her head.  She received 100 mcg of fentanyl from EMS.  She rates her pain a 7 out of 10 in intensity.   Past Medical History:  Diagnosis Date  . Anxiety   . Cancer ([place])    a spot on liver and treated   . Complication of anesthesia    restless,easily upset  . COPD (chronic obstructive pulmonary disease) ([place])   . Diabetes mellitus without complication ([place])   . Headache(784.0)   . Restless   . Sleep apnea    neg test  . Stroke [facility])     Patient Active Problem List   Diagnosis Date Noted  . Trimalleolar fracture 08/09/2017  . TIA (transient ischemic attack) 04/07/2017  . Anxiety 04/07/2017  . History of stroke   . Complicated migraine   . Cerebral infarction ([place]) 10/11/2012  . Brain TIA 10/11/2012  . Hyperlipidemia 10/11/2012  . Sleep apnea 10/11/2012  . Diabetes ([place]) 10/11/2012  . Tobacco abuse 10/11/2012  . COPD (chronic obstructive pulmonary disease) ([place]) 10/11/2012    Past Surgical History:  Procedure Laterality Date  . ABDOMINAL HYSTERECTOMY    . ANTERIOR CERVICAL DECOMP/DISCECTOMY FUSION N/A 03/11/2012   Procedure: ANTERIOR CERVICAL DECOMPRESSION/DISCECTOMY FUSION 1 LEVEL;  Surgeon: Ophelia Charter,  MD;  Location: [facility];  Service: Neurosurgery;  Laterality: N/A;  Cervical five-six anterior cervical decompression with fusion interbody prothesis plating and bonegraft  . BACK SURGERY    . EYE SURGERY    . HERNIA REPAIR    . JOINT REPLACEMENT     bil knees  . REPLACEMENT TOTAL KNEE BILATERAL      Prior to Admission medications   Medication Sig Start Date End Date Taking? Authorizing Provider  ALPRAZolam Duanne Moron) 1 MG tablet Take 1 mg by mouth 3 (three) times daily as needed for anxiety.     [provider]  BYDUREON BCISE 2 MG/0.85ML AUIJ Inject 2 mg into the skin once a week. On Thursday 03/03/17   [provider]  citalopram (CELEXA) 40 MG tablet Take 40 mg by mouth daily. 03/23/17   [provider]  clopidogrel (PLAVIX) 75 MG tablet Take 75 mg by mouth daily. 03/18/17   [provider]  dicyclomine (BENTYL) 20 MG tablet Take 20 mg by mouth 2 (two) times daily. 03/06/17   [provider]  Erenumab-aooe (AIMOVIG) 70 MG/ML SOAJ Inject 70 mg into the skin.    [provider]  furosemide (LASIX) 40 MG tablet Take 40 mg by mouth daily.  06/11/17   [provider]  gabapentin (NEURONTIN) 600 MG tablet Take 1 tablet (600 mg total) by mouth 4 (four) times daily. 05/20/17  Venancio Poisson, NP  insulin aspart (NOVOLOG) 100 UNIT/ML FlexPen Inject 30 Units into the skin at bedtime.    [provider]  linagliptin (TRADJENTA) 5 MG TABS tablet Take 5 mg by mouth daily. 07/08/13   [provider]  nystatin (MYCOSTATIN/NYSTOP) powder  06/16/17   [provider]  Oxycodone HCl 10 MG TABS Take 1 tablet (10 mg total) by mouth 4 (four) times daily as needed (pain). 04/08/17   Geradine Girt, DO  OXYCONTIN 30 MG 12 hr tablet Take 30 mg by mouth 2 (two) times daily. 03/15/17   [provider]  pantoprazole (PROTONIX) 20 MG tablet  06/16/17   [provider]  pioglitazone (ACTOS) 45 MG tablet Take 45 mg by  mouth daily.    [provider]  rizatriptan (MAXALT-MLT) 10 MG disintegrating tablet Take 10 mg by mouth as needed for migraine. May repeat in 2 hours if needed    [provider]  simvastatin (ZOCOR) 40 MG tablet Take 1 tablet (40 mg total) by mouth every evening. 10/13/12   Velvet Bathe, MD  topiramate (TOPAMAX) 100 MG tablet Take 1 tablet (100 mg total) by mouth 2 (two) times daily. 05/20/17   Venancio Poisson, NP  VENTOLIN HFA 108 (90 Base) MCG/ACT inhaler  06/16/17   [provider]  Vitamin D, Ergocalciferol, (DRISDOL) 50000 units CAPS capsule Take 50,000 Units by mouth every 7 (seven) days. On Wednesday    [provider]  [facility] 625 MG tablet Take 1,875 mg by mouth 2 (two) times daily.  03/04/17   [provider]    Allergies Tramadol and Augmentin [amoxicillin-pot clavulanate]  Family History  Problem Relation Age of Onset  . Diabetes Mother   . Hypertension Mother   . Hypertension Father     Social History Social History   Tobacco Use  . Smoking status: Current Every Day Smoker    Packs/day: 0.50    Years: 20.00    Pack years: 10.00    Types: Cigarettes  . Smokeless tobacco: Never Used  Substance Use Topics  . Alcohol use: No  . Drug use: Yes    Types: Marijuana    Comment: occasionally    Review of Systems  Constitutional: No fever/chills Eyes: No visual changes. ENT: No sore throat. Cardiovascular: Denies chest pain. Respiratory: Denies shortness of breath. Gastrointestinal: No abdominal pain.  No nausea, no vomiting.  No diarrhea.  No constipation. Genitourinary: Negative for dysuria. Musculoskeletal: Right ankle pain and deformity  Skin: Negative for rash. Neurological: Negative for headaches, focal weakness or numbness.   ____________________________________________   PHYSICAL EXAM:  VITAL SIGNS: ED Triage Vitals                    08/08/17  2352  Enc Vitals Group     BP 132/77     Pulse 93     Resp  19     Temp 98.6     Temp src      SpO2 96%     Weight      Height      Head Circumference      Peak Flow      Pain Score      Pain Loc      Pain Edu?      Excl. in [place]?     Constitutional: Alert and oriented. Well appearing and in moderate distress. Eyes: Conjunctivae are normal. PERRL. EOMI. Head: Atraumatic. Nose: No congestion/rhinnorhea. Mouth/Throat: Mucous membranes  are moist.  Oropharynx non-erythematous. Cardiovascular: Normal rate, regular rhythm. Grossly normal heart sounds.  Good peripheral circulation. Respiratory: Normal respiratory effort.  No retractions. Lungs CTAB. Gastrointestinal: Soft and nontender. No distention.  Positive bowel sounds Musculoskeletal: right ankle deformity and tenderness to palpation, Dopplerable pulse on the right DP, motion intact and some mildly intact sensation given the patient's history of neuropathy Neurologic:  Normal speech and language. No gross focal neurologic deficits are appreciated. No gait instability. Skin:  Skin is warm, dry and intact.  Psychiatric: Mood and affect are normal.   ____________________________________________   LABS (all labs ordered are listed, but only abnormal results are displayed)  Labs Reviewed  BASIC METABOLIC PANEL - Abnormal; Notable for the following components:      Result Value   Sodium 134 (*)    Glucose, Bld 349 (*)    Calcium 8.7 (*)    All other components within normal limits  CBC - Abnormal; Notable for the following components:   WBC 11.7 (*)    All other components within normal limits  GLUCOSE, CAPILLARY - Abnormal; Notable for the following components:   Glucose-Capillary 327 (*)    All other components within normal limits  TSH  HEMOGLOBIN A1C   ____________________________________________  EKG  ED ECG REPORT I, Loney Hering, the attending physician, personally viewed and interpreted this ECG.   Date: 08/08/2017  EKG Time: 2345  Rate: 94  Rhythm: normal sinus  rhythm  Axis: normal  Intervals:none  ST&T Change: none  ____________________________________________  RADIOLOGY  ED MD interpretation:  Right ankle xray  Official radiology report(s): Dg Ankle Complete Right  Result Date: 08/09/2017 CLINICAL DATA:  Fall today with right ankle pain and deformity. EXAM: RIGHT ANKLE - COMPLETE 3+ VIEW COMPARISON:  None. FINDINGS: Comminuted and displaced fracture of the distal tibia involves the posterior and medial malleolus and extends into the tibial talar joint. Mildly displaced distal fibular fracture just proximal to the ankle mortise. There is diffuse soft tissue edema about the ankle. IMPRESSION: Comminuted and displaced trimalleolar fracture. Electronically Signed   By: Jeb Levering M.D.   On: 08/09/2017 00:59    ____________________________________________   PROCEDURES  Procedure(s) performed: None  .Sedation Date/Time: 08/09/2017 4:20 AM Performed by: Loney Hering, MD Authorized by: Loney Hering, MD   Consent:    Consent obtained:  Written (electronic informed consent)   Consent given by:  Patient   Risks discussed:  Allergic reaction, dysrhythmia, inadequate sedation, nausea, vomiting, respiratory compromise necessitating ventilatory assistance and intubation, prolonged sedation necessitating reversal and prolonged hypoxia resulting in organ damage   Alternatives discussed:  Analgesia without sedation Universal protocol:    Procedure explained and questions answered to patient or proxy's satisfaction: yes     Relevant documents present and verified: yes     Test results available and properly labeled: yes     Imaging studies available: yes     Required blood products, implants, devices, and special equipment available: yes     Immediately prior to procedure a time out was called: yes     Patient identity confirmation method:  Arm band and verbally with patient Indications:    Procedure performed:  Fracture  reduction   Procedure necessitating sedation performed by:  Physician performing sedation   Intended level of sedation:  Moderate (conscious sedation) Pre-sedation assessment:    Time since last food or drink:  1700   ASA classification: class 3 - patient with severe systemic disease  Neck mobility: normal     Mouth opening:  2 finger widths   Thyromental distance:  3 finger widths   Mallampati score:  III - soft palate, base of uvula visible   Pre-sedation assessments completed and reviewed: airway patency, cardiovascular function, mental status, pain level and respiratory function     Pre-sedation assessments completed and reviewed: nausea/vomiting not reviewed     Pre-sedation assessment completed:  08/09/2017 3:20 AM Immediate pre-procedure details:    Reassessment: Patient reassessed immediately prior to procedure     Reviewed: vital signs, relevant labs/tests and NPO status     Verified: bag valve mask available, emergency equipment available, intubation equipment available, IV patency confirmed, oxygen available, reversal medications available and suction available   Procedure details (see MAR for exact dosages):    Preoxygenation:  Nasal cannula   Sedation:  Etomidate   Analgesia:  Hydromorphone   Intra-procedure monitoring:  Blood pressure monitoring, continuous pulse oximetry, cardiac monitor, frequent vital sign checks, frequent LOC assessments and continuous capnometry   Intra-procedure events: none     Total Provider sedation time (minutes):  8 Post-procedure details:    Post-sedation assessment completed:  08/09/2017 5:27 AM   Attendance: Constant attendance by certified staff until patient recovered     Recovery: Patient returned to pre-procedure baseline     Post-sedation assessments completed and reviewed: airway patency, cardiovascular function, hydration status, mental status and respiratory function     Patient is stable for discharge or admission: yes     Patient  tolerance:  Tolerated well, no immediate complications .Splint Application Date/Time: 5/70/1779 4:28 AM Performed by: Loney Hering, MD Authorized by: Loney Hering, MD   Consent:    Consent obtained:  Verbal   Consent given by:  Patient   Risks discussed:  Discoloration, numbness and pain Pre-procedure details:    Sensation:  Numbness   Skin color:  Pink Procedure details:    Laterality:  Right   Location:  Ankle   Cast type:  Short leg   Splint type:  Sugar tong and short leg   Supplies:  Ortho-Glass Post-procedure details:    Pain:  Unchanged   Sensation:  Numbness   Skin color:  Pink   Patient tolerance of procedure:  Tolerated well, no immediate complications    Critical Care performed: No  ____________________________________________   INITIAL IMPRESSION / ASSESSMENT AND PLAN / ED COURSE  As part of my medical decision making, I reviewed the following data within the electronic MEDICAL RECORD NUMBER Notes from prior ED visits and [place] Controlled Substance Database   This is a 52 year old female who comes into the hospital today with a fall injuring her right ankle.  Looking at the deformity I am concerned that the patient has a fracture with some possible dislocation.  I will send the patient for an x-ray and check some blood work.  She will be reassessed.  I will check some blood work as well as give the patient some morphine and Zofran.  I contacted Dr. Donivan Scull who is on-call for orthopedic surgery.  He did recommend reducing the fracture and sending the patient home to follow-up with orthopedic surgery.  We did perform a sedation and attempted to reduce the dislocation but given the fracture it is hard to keep it in place.  We did splint the area.  The patient did receive multiple doses of morphine as well as a dose of Dilaudid for her pain.  The patient only received 3 mL's  of etomidate.  Since the patient is still in pain and having discomfort we will admit her  to the hospitalist service for an orthopedic consult in the morning.      ____________________________________________   FINAL CLINICAL IMPRESSION(S) / ED DIAGNOSES  Final diagnoses:  Closed trimalleolar fracture of right ankle, initial encounter     ED Discharge Orders    None       Note:  This document was prepared using Dragon voice recognition software and may include unintentional dictation errors.    Loney Hering, MD 08/09/17 [phone]

## 2017-08-08 NOTE — ED Triage Notes (Signed)
Pt brought in by [facility] from son's house.  Per EMS, pt went to get into car to leave and put all of weight on R leg and then felt R ankle pop.  Noted deformity.  Pt received 173mcg of fentanyl en route with EMS.  Pt is A&Ox4, in pain, but NAD.

## 2017-08-09 ENCOUNTER — Other Ambulatory Visit: Payer: Self-pay

## 2017-08-09 ENCOUNTER — Encounter: Payer: Self-pay | Admitting: Anesthesiology

## 2017-08-09 ENCOUNTER — Encounter
Admission: EM | Disposition: A | Discharge status: Home Health | Payer: Self-pay | Source: Home / Self Care | Attending: Internal Medicine

## 2017-08-09 ENCOUNTER — Observation Stay: Payer: Medicare Other

## 2017-08-09 DIAGNOSIS — S82853A Displaced trimalleolar fracture of unspecified lower leg, initial encounter for closed fracture: Secondary | ICD-10-CM | POA: Diagnosis present

## 2017-08-09 LAB — CBC
HEMATOCRIT: 42.5 % (ref 35.0–47.0)
Hemoglobin: 14.2 g/dL (ref 12.0–16.0)
MCH: 29.3 pg (ref 26.0–34.0)
MCHC: 33.5 g/dL (ref 32.0–36.0)
MCV: 87.6 fL (ref 80.0–100.0)
Platelets: 285 10*3/uL (ref 150–440)
RBC: 4.85 MIL/uL (ref 3.80–5.20)
RDW: 14.5 % (ref 11.5–14.5)
WBC: 11.7 10*3/uL — ABNORMAL HIGH (ref 3.6–11.0)

## 2017-08-09 LAB — GLUCOSE, CAPILLARY
Glucose-Capillary: 327 mg/dL — ABNORMAL HIGH (ref 70–99)
Glucose-Capillary: 324 mg/dL — ABNORMAL HIGH (ref 70–99)
Glucose-Capillary: 300 mg/dL — ABNORMAL HIGH (ref 70–99)
Glucose-Capillary: 298 mg/dL — ABNORMAL HIGH (ref 70–99)
Glucose-Capillary: 290 mg/dL — ABNORMAL HIGH (ref 70–99)

## 2017-08-09 LAB — BASIC METABOLIC PANEL
ANION GAP: 7 (ref 5–15)
BUN: 14 mg/dL (ref 6–20)
CHLORIDE: 101 mmol/L (ref 98–111)
CO2: 26 mmol/L (ref 22–32)
Calcium: 8.7 mg/dL — ABNORMAL LOW (ref 8.9–10.3)
Creatinine, Ser: 0.76 mg/dL (ref 0.44–1.00)
GFR calc Af Amer: >60 mL/min (ref >60)
GLUCOSE: 349 mg/dL — AB (ref 70–99)
POTASSIUM: 4.1 mmol/L (ref 3.5–5.1)
Sodium: 134 mmol/L — ABNORMAL LOW (ref 135–145)

## 2017-08-09 LAB — TSH: TSH: 1.086 u[IU]/mL (ref 0.350–4.500)

## 2017-08-09 LAB — HEMOGLOBIN A1C
Hgb A1c MFr Bld: 9.2 % — ABNORMAL HIGH (ref 4.8–5.6)
Mean Plasma Glucose: 217.34 mg/dL

## 2017-08-09 SURGERY — OPEN REDUCTION INTERNAL FIXATION (ORIF) ANKLE FRACTURE
Anesthesia: General

## 2017-08-09 SURGERY — Surgical Case
Anesthesia: *Unknown

## 2017-08-09 MED ORDER — TETRACAINE HCL 0.5 % OP SOLN
OPHTHALMIC | Status: AC
  Administered 2017-08-09: 05:00:00
  Filled 2017-08-09: qty 4

## 2017-08-09 MED ORDER — ENOXAPARIN SODIUM 40 MG/0.4ML ~~LOC~~ SOLN: 40.0000 mg | Freq: Two times a day (BID) | SUBCUTANEOUS | Status: DC

## 2017-08-09 MED ORDER — ETOMIDATE 2 MG/ML IV SOLN
INTRAVENOUS | Status: AC
  Filled 2017-08-09: qty 10

## 2017-08-09 MED ORDER — INSULIN ASPART 100 UNIT/ML ~~LOC~~ SOLN
0.0000 [IU] | Freq: Three times a day (TID) | SUBCUTANEOUS | Status: DC
  Administered 2017-08-09: 3 [IU] via SUBCUTANEOUS
  Administered 2017-08-10: 8 [IU] via SUBCUTANEOUS
  Administered 2017-08-10: 5 [IU] via SUBCUTANEOUS
  Administered 2017-08-10 – 2017-08-11 (×2): 8 [IU] via SUBCUTANEOUS
  Administered 2017-08-11: 5 [IU] via SUBCUTANEOUS
  Filled 2017-08-09 (×6): qty 1

## 2017-08-09 MED ORDER — INSULIN ASPART 100 UNIT/ML ~~LOC~~ SOLN
4.0000 [IU] | Freq: Three times a day (TID) | SUBCUTANEOUS | Status: DC
  Administered 2017-08-09 – 2017-08-11 (×3): 4 [IU] via SUBCUTANEOUS
  Filled 2017-08-09 (×3): qty 1

## 2017-08-09 MED ORDER — HYDROMORPHONE HCL 1 MG/ML IJ SOLN
INTRAMUSCULAR | Status: AC
  Filled 2017-08-09: qty 1

## 2017-08-09 MED ORDER — ETOMIDATE 2 MG/ML IV SOLN: 0.1000 mg/kg | Freq: Once | INTRAVENOUS | Status: DC

## 2017-08-09 MED ORDER — HYDROMORPHONE HCL 1 MG/ML IJ SOLN
1.0000 mg | Freq: Once | INTRAMUSCULAR | Status: AC
  Administered 2017-08-09: 1 mg via INTRAVENOUS

## 2017-08-09 MED ORDER — INSULIN ASPART 100 UNIT/ML ~~LOC~~ SOLN
0.0000 [IU] | Freq: Three times a day (TID) | SUBCUTANEOUS | Status: DC
  Administered 2017-08-09: 7 [IU] via SUBCUTANEOUS
  Administered 2017-08-09: 5 [IU] via SUBCUTANEOUS
  Filled 2017-08-09 (×2): qty 1

## 2017-08-09 MED ORDER — DOCUSATE SODIUM 100 MG PO CAPS
100.0000 mg | ORAL_CAPSULE | Freq: Two times a day (BID) | ORAL | Status: DC
  Administered 2017-08-09 – 2017-08-10 (×3): 100 mg via ORAL
  Filled 2017-08-09 (×3): qty 1

## 2017-08-09 MED ORDER — INSULIN ASPART 100 UNIT/ML ~~LOC~~ SOLN
0.0000 [IU] | Freq: Every day | SUBCUTANEOUS | Status: DC
  Administered 2017-08-09: 3 [IU] via SUBCUTANEOUS
  Filled 2017-08-09: qty 1

## 2017-08-09 MED ORDER — ACETAMINOPHEN 650 MG RE SUPP: 650.0000 mg | Freq: Four times a day (QID) | RECTAL | Status: DC | PRN

## 2017-08-09 MED ORDER — ACETAMINOPHEN 325 MG PO TABS: 650.0000 mg | ORAL_TABLET | Freq: Four times a day (QID) | ORAL | Status: DC | PRN

## 2017-08-09 MED ORDER — POLYVINYL ALCOHOL 1.4 % OP SOLN
1.0000 [drp] | OPHTHALMIC | Status: DC | PRN
  Filled 2017-08-09: qty 15

## 2017-08-09 MED ORDER — ALPRAZOLAM 1 MG PO TABS
1.0000 mg | ORAL_TABLET | Freq: Three times a day (TID) | ORAL | Status: DC | PRN
  Administered 2017-08-09 – 2017-08-11 (×3): 1 mg via ORAL
  Filled 2017-08-09 (×3): qty 1

## 2017-08-09 MED ORDER — CITALOPRAM HYDROBROMIDE 20 MG PO TABS
40.0000 mg | ORAL_TABLET | Freq: Every day | ORAL | Status: DC
  Administered 2017-08-09 – 2017-08-11 (×3): 40 mg via ORAL
  Filled 2017-08-09 (×3): qty 2

## 2017-08-09 MED ORDER — ONDANSETRON HCL 4 MG/2ML IJ SOLN: 4.0000 mg | Freq: Four times a day (QID) | INTRAMUSCULAR | Status: DC | PRN

## 2017-08-09 MED ORDER — CLOPIDOGREL BISULFATE 75 MG PO TABS
75.0000 mg | ORAL_TABLET | Freq: Every day | ORAL | Status: DC
  Administered 2017-08-09: 75 mg via ORAL
  Filled 2017-08-09: qty 1

## 2017-08-09 MED ORDER — ETOMIDATE 2 MG/ML IV SOLN
INTRAVENOUS | Status: AC | PRN
  Administered 2017-08-09: 6 mg via INTRAVENOUS

## 2017-08-09 MED ORDER — FLUORESCEIN SODIUM 1 MG OP STRP
ORAL_STRIP | OPHTHALMIC | Status: AC
  Administered 2017-08-09: 05:00:00
  Filled 2017-08-09: qty 1

## 2017-08-09 MED ORDER — ONDANSETRON HCL 4 MG PO TABS: 4.0000 mg | ORAL_TABLET | Freq: Four times a day (QID) | ORAL | Status: DC | PRN

## 2017-08-09 MED ORDER — MORPHINE SULFATE (PF) 4 MG/ML IV SOLN
4.0000 mg | Freq: Once | INTRAVENOUS | Status: AC
  Administered 2017-08-09: 4 mg via INTRAVENOUS
  Filled 2017-08-09: qty 1

## 2017-08-09 MED ORDER — MORPHINE SULFATE (PF) 4 MG/ML IV SOLN
4.0000 mg | INTRAVENOUS | Status: DC | PRN
Start: 2017-08-09 — End: 2017-08-10
  Administered 2017-08-09 – 2017-08-10 (×5): 4 mg via INTRAVENOUS
  Filled 2017-08-09 (×5): qty 1

## 2017-08-09 MED ORDER — OXYCODONE HCL 5 MG PO TABS
5.0000 mg | ORAL_TABLET | Freq: Four times a day (QID) | ORAL | Status: DC | PRN
  Administered 2017-08-09 – 2017-08-10 (×2): 5 mg via ORAL
  Filled 2017-08-09 (×2): qty 1

## 2017-08-09 MED ORDER — OXYCODONE HCL ER 15 MG PO T12A
15.0000 mg | EXTENDED_RELEASE_TABLET | Freq: Two times a day (BID) | ORAL | Status: DC
  Administered 2017-08-09 (×2): 15 mg via ORAL
  Filled 2017-08-09 (×2): qty 1

## 2017-08-09 SURGICAL SUPPLY — 33 items
BANDAGE ACE 4X5 VEL STRL LF (GAUZE/BANDAGES/DRESSINGS) ×4 IMPLANT
BNDG COHESIVE 4X5 TAN STRL (GAUZE/BANDAGES/DRESSINGS) ×4 IMPLANT
CANISTER SUCT 1200ML W/VALVE (MISCELLANEOUS) ×4 IMPLANT
COOLER POLAR GLACIER W/PUMP (MISCELLANEOUS) ×3 IMPLANT
CUFF TOURN 24 STER (MISCELLANEOUS) IMPLANT
CUFF TOURN 30 STER DUAL PORT (MISCELLANEOUS) IMPLANT
DRAPE FLUOR MINI C-ARM 54X84 (DRAPES) ×4 IMPLANT
DRSG DERMACEA 8X12 NADH (GAUZE/BANDAGES/DRESSINGS) ×4 IMPLANT
DURAPREP 26ML APPLICATOR (WOUND CARE) ×3 IMPLANT
ELECT CAUTERY BLADE 6.4 (BLADE) ×4 IMPLANT
ELECT REM PT RETURN 9FT ADLT (ELECTROSURGICAL) ×3
ELECTRODE REM PT RTRN 9FT ADLT (ELECTROSURGICAL) ×1 IMPLANT
GAUZE SPONGE 4X4 12PLY STRL (GAUZE/BANDAGES/DRESSINGS) ×4 IMPLANT
GLOVE BIO SURGEON STRL SZ8.5 (GLOVE) ×4 IMPLANT
GLOVE BIOGEL PI IND STRL 8.5 (GLOVE) ×2 IMPLANT
GLOVE BIOGEL PI INDICATOR 8.5 (GLOVE) ×2
GOWN STRL REUS W/ TWL LRG LVL3 (GOWN DISPOSABLE) ×4 IMPLANT
GOWN STRL REUS W/TWL LRG LVL3 (GOWN DISPOSABLE) ×6
IMMOBILIZER SHDR MD LX WHT (SOFTGOODS) ×12 IMPLANT
KIT TURNOVER KIT A (KITS) ×4 IMPLANT
LABEL OR SOLS (LABEL) ×4 IMPLANT
PACK EXTREMITY ARMC (MISCELLANEOUS) ×3 IMPLANT
PAD ABD DERMACEA PRESS 5X9 (GAUZE/BANDAGES/DRESSINGS) ×3 IMPLANT
PAD PREP 24X41 OB/GYN DISP (PERSONAL CARE ITEMS) ×4 IMPLANT
PAD WRAPON POLAR ANKLE (MISCELLANEOUS) ×2 IMPLANT
SPLINT CAST 1 STEP 5X30 WHT (MISCELLANEOUS) ×4 IMPLANT
SPONGE LAP 18X18 RF (DISPOSABLE) ×4 IMPLANT
STAPLER SKIN PROX 35W (STAPLE) ×4 IMPLANT
STOCKINETTE STRL 6IN 960660 (GAUZE/BANDAGES/DRESSINGS) ×4 IMPLANT
SUT VIC AB 0 CT1 36 (SUTURE) ×8 IMPLANT
SUT VIC AB 2-0 SH 27 (SUTURE) ×3
SUT VIC AB 2-0 SH 27XBRD (SUTURE) ×2 IMPLANT
WRAPON POLAR PAD ANKLE (MISCELLANEOUS) ×3

## 2017-08-09 NOTE — Consult Note (Signed)
ORTHOPAEDIC CONSULTATION  PATIENT NAME: [name] DOB: August 14, 1965  MRN: [id number]  REQUESTING PHYSICIAN: Nicholes Mango, MD  Chief Complaint: fracture dislocation right ankle  HPI: [name] is a 52 y.o. female who was trying to get into her vehicle and somehow lost her footing and fell out of her car. She sustained a right ankle fracture dislocation. Patient has history of stroke and COPD and diabetes. She has morbid obesity and currently weighs 309 pounds. Patient was brought to the ER at [facility]. X-rays showed evidence of right ankle fracture dislocation. Patient underwent closed reduction in the ER and splinting.  Past Medical History:  Diagnosis Date  . Anxiety   . Cancer ([place])    a spot on liver and treated   . Complication of anesthesia    restless,easily upset  . COPD (chronic obstructive pulmonary disease) ([place])   . Diabetes mellitus without complication ([place])   . Headache(784.0)   . Restless   . Sleep apnea    neg test  . Stroke [facility])    Past Surgical History:  Procedure Laterality Date  . ABDOMINAL HYSTERECTOMY    . ANTERIOR CERVICAL DECOMP/DISCECTOMY FUSION N/A 03/11/2012   Procedure: ANTERIOR CERVICAL DECOMPRESSION/DISCECTOMY FUSION 1 LEVEL;  Surgeon: Ophelia Charter, MD;  Location: [place] [facility];  Service: Neurosurgery;  Laterality: N/A;  Cervical five-six anterior cervical decompression with fusion interbody prothesis plating and bonegraft  . BACK SURGERY    . EYE SURGERY    . HERNIA REPAIR    . JOINT REPLACEMENT     bil knees  . REPLACEMENT TOTAL KNEE BILATERAL     Social History   Socioeconomic History  . Marital status: Divorced    Spouse name: Not on file  . Number of children: Not on file  . Years of education: Not on file  . Highest education level: Not on file  Occupational History  . Not on file  Social Needs  . Financial resource strain: Not on file  . Food insecurity:    Worry: Not on file    Inability: Not  on file  . Transportation needs:    Medical: Not on file    Non-medical: Not on file  Tobacco Use  . Smoking status: Current Every Day Smoker    Packs/day: 0.50    Years: 20.00    Pack years: 10.00    Types: Cigarettes  . Smokeless tobacco: Never Used  Substance and Sexual Activity  . Alcohol use: No  . Drug use: Yes    Types: Marijuana    Comment: occasionally  . Sexual activity: Not on file  Lifestyle  . Physical activity:    Days per week: Not on file    Minutes per session: Not on file  . Stress: Not on file  Relationships  . Social connections:    Talks on phone: Not on file    Gets together: Not on file    Attends religious service: Not on file    Active member of club or organization: Not on file    Attends meetings of clubs or organizations: Not on file    Relationship status: Not on file  Other Topics Concern  . Not on file  Social History Narrative  . Not on file   Family History  Problem Relation Age of Onset  . Diabetes Mother   . Hypertension Mother   . Hypertension Father    Allergies  Allergen Reactions  . Tramadol Nausea And Vomiting  and Other (See Comments)    Increase blood pressure  . Augmentin [Amoxicillin-Pot Clavulanate] Rash   Prior to Admission medications   Medication Sig Start Date End Date Taking? Authorizing Provider  ALPRAZolam Duanne Moron) 1 MG tablet Take 1 mg by mouth 3 (three) times daily as needed for anxiety.     [provider]  BYDUREON BCISE 2 MG/0.85ML AUIJ Inject 2 mg into the skin once a week. On Thursday 03/03/17   [provider]  citalopram (CELEXA) 40 MG tablet Take 40 mg by mouth daily. 03/23/17   [provider]  clopidogrel (PLAVIX) 75 MG tablet Take 75 mg by mouth daily. 03/18/17   [provider]  dicyclomine (BENTYL) 20 MG tablet Take 20 mg by mouth 2 (two) times daily. 03/06/17   [provider]  Erenumab-aooe (AIMOVIG) 70 MG/ML SOAJ Inject 70 mg into the skin.    [provider]  furosemide (LASIX) 40 MG tablet Take 40 mg by mouth daily.  06/11/17   [provider]  gabapentin (NEURONTIN) 600 MG tablet Take 1 tablet (600 mg total) by mouth 4 (four) times daily. 05/20/17   Venancio Poisson, NP  insulin aspart (NOVOLOG) 100 UNIT/ML FlexPen Inject 30 Units into the skin at bedtime.    [provider]  linagliptin (TRADJENTA) 5 MG TABS tablet Take 5 mg by mouth daily. 07/08/13   [provider]  nystatin (MYCOSTATIN/NYSTOP) powder  06/16/17   [provider]  Oxycodone HCl 10 MG TABS Take 1 tablet (10 mg total) by mouth 4 (four) times daily as needed (pain). 04/08/17   Geradine Girt, DO  OXYCONTIN 30 MG 12 hr tablet Take 30 mg by mouth 2 (two) times daily. 03/15/17   [provider]  pantoprazole (PROTONIX) 20 MG tablet  06/16/17   [provider]  pioglitazone (ACTOS) 45 MG tablet Take 45 mg by mouth daily.    [provider]  rizatriptan (MAXALT-MLT) 10 MG disintegrating tablet Take 10 mg by mouth as needed for migraine. May repeat in 2 hours if needed    [provider]  simvastatin (ZOCOR) 40 MG tablet Take 1 tablet (40 mg total) by mouth every evening. 10/13/12   Velvet Bathe, MD  topiramate (TOPAMAX) 100 MG tablet Take 1 tablet (100 mg total) by mouth 2 (two) times daily. 05/20/17   Venancio Poisson, NP  VENTOLIN HFA 108 (90 Base) MCG/ACT inhaler  06/16/17   [provider]  Vitamin D, Ergocalciferol, (DRISDOL) 50000 units CAPS capsule Take 50,000 Units by mouth every 7 (seven) days. On Wednesday    [provider]  [facility] 625 MG tablet Take 1,875 mg by mouth 2 (two) times daily.  03/04/17   [provider]   Dg Ankle Complete Right  Result Date: 08/09/2017 CLINICAL DATA:  Fall today with right ankle pain and deformity. EXAM: RIGHT ANKLE - COMPLETE 3+ VIEW COMPARISON:  None. FINDINGS: Comminuted and displaced fracture of the distal tibia involves the posterior and  medial malleolus and extends into the tibial talar joint. Mildly displaced distal fibular fracture just proximal to the ankle mortise. There is diffuse soft tissue edema about the ankle. IMPRESSION: Comminuted and displaced trimalleolar fracture. Electronically Signed   By: Jeb Levering M.D.   On: 08/09/2017 00:59    Positive ROS: All other systems have been reviewed and were otherwise negative with the exception of those mentioned in the HPI and as above.  Physical Exam: General: Well developed, well nourished female  seen in no acute distress. HEENT: Atraumatic and normocephalic. Sclera are clear. Extraocular motion is intact. Oropharynx is clear with moist mucosa. Neck: Supple, nontender, good range of motion. No JVD or carotid bruits. Lungs: Clear to auscultation bilaterally. Cardiovascular: Regular rate and rhythm with normal S1 and S2. No murmurs. No gallops or rubs. Pedal pulses are palpable bilaterally. Homans test is negative bilaterally. No significant pretibial or ankle edema. Abdomen: Soft, nontender, and nondistended. Bowel sounds are present. Skin: No lesions in the area of chief complaint Neurologic: Awake, alert, and oriented. Sensory function is grossly intact. Motor strength is felt to be 5 over 5 bilaterally. No clonus or tremor. Good motor coordination. Lymphatic: No axillary or cervical lymphadenopathy  MUSCULOSKELETAL: right lower extremity is in a sugar tong splint. Patient maintains flexion and extension off her toes. There is no pain with passive range of motion off her toes.  Assessment: 52 years old female status post fall with right ankle fracture dislocation with medial and lateral malleolus fracture status post closed reduction in the ER.  Plan: 52 years old female with obesity status post fall with right ankle fracture dislocation. Patient underwent closed reduction in the ER.x-rays done this morning show maintenance of reduction. However based on the nature of  the injury patient is at risk of recurrent dislocation. Open reduction internal fixation of the medial and lateral malleolus is recommended.I described the procedure in detail as well as the postoperative recovery process. Patient agrees with the plan. She understands that she will be nonweightbearing on her right lower extremity for 6 weeks postoperatively.  Creig Hines,  M.D.

## 2017-08-09 NOTE — Care Management Note (Signed)
Case Management Note  Patient Details  Name: [name] MRN: [id number] Date of Birth: 1965-11-09  Subjective/Objective:  Patient admitted to [facility] under observation status for a trimalleolar fracture. RNCM consulted on patient to provide MOON letter and complete assessment. Patient currently lives at home with her husband [name] [phone]. Patient uses a wheelchair most of the time at home, also has a walker. Spouse able to assist with activities if daily living. Currently open with [facility] for PT. Spouse provides transport. PCP is Threasa Alpha, obtains medications from [facility] without issue.                    Action/Plan: RNCM to continue to follow for any needs. Will notify [facility] of admission.   Expected Discharge Date:  08/11/17               Expected Discharge Plan:     In-House Referral:     Discharge planning Services     Post Acute Care Choice:    Choice offered to:     DME Arranged:    DME Agency:     HH Arranged:    HH Agency:     Status of Service:     If discussed at [employer], dates discussed:    Additional Comments:  Latanya Maudlin, RN 08/09/2017, 8:44 AM

## 2017-08-09 NOTE — Care Management Obs Status (Signed)
[place] NOTIFICATION   Patient Details  Name: [name] MRN: [id number] Date of Birth: May 14, 1965   Medicare Observation Status Notification Given:  Yes    Kevona Lupinacci A Jerine Surles, RN 08/09/2017, 8:35 AM

## 2017-08-09 NOTE — Plan of Care (Signed)

## 2017-08-09 NOTE — ED Notes (Signed)
Assisted Iris, rn and dr. Dahlia Client with conscious sedation.

## 2017-08-09 NOTE — Progress Notes (Signed)
Lovenox changed to 40 mg BID for BMI >40 and CrCl >30. 

## 2017-08-09 NOTE — Plan of Care (Signed)
  Problem: Education: Goal: Knowledge of General Education information will improve Description Including pain rating scale, medication(s)/side effects and non-pharmacologic comfort measures 08/09/2017 1901 by Verdene Rio, RN Outcome: Progressing 08/09/2017 1854 by Verdene Rio, RN Outcome: Progressing   Problem: Health Behavior/Discharge Planning: Goal: Ability to manage health-related needs will improve 08/09/2017 1901 by Verdene Rio, RN Outcome: Progressing 08/09/2017 1854 by Verdene Rio, RN Outcome: Progressing   Problem: Clinical Measurements: Goal: Ability to maintain clinical measurements within normal limits will improve 08/09/2017 1901 by Verdene Rio, RN Outcome: Progressing 08/09/2017 1854 by Verdene Rio, RN Outcome: Progressing Goal: Will remain free from infection 08/09/2017 1901 by Verdene Rio, RN Outcome: Progressing 08/09/2017 1854 by Verdene Rio, RN Outcome: Progressing Goal: Diagnostic test results will improve 08/09/2017 1901 by Verdene Rio, RN Outcome: Progressing 08/09/2017 1854 by Verdene Rio, RN Outcome: Progressing Goal: Respiratory complications will improve 08/09/2017 1901 by Verdene Rio, RN Outcome: Progressing 08/09/2017 1854 by Verdene Rio, RN Outcome: Progressing Goal: Cardiovascular complication will be avoided 08/09/2017 1901 by Verdene Rio, RN Outcome: Progressing 08/09/2017 1854 by Verdene Rio, RN Outcome: Progressing   Problem: Activity: Goal: Risk for activity intolerance will decrease 08/09/2017 1901 by Verdene Rio, RN Outcome: Progressing 08/09/2017 1854 by Verdene Rio, RN Outcome: Progressing   Problem: Nutrition: Goal: Adequate nutrition will be maintained 08/09/2017 1901 by Verdene Rio, RN Outcome: Progressing 08/09/2017 1854 by Verdene Rio, RN Outcome: Progressing   Problem: Coping: Goal: Level of anxiety will decrease 08/09/2017 1901 by Verdene Rio, RN Outcome: Progressing 08/09/2017 1854 by Verdene Rio,  RN Outcome: Progressing   Problem: Elimination: Goal: Will not experience complications related to bowel motility 08/09/2017 1901 by Verdene Rio, RN Outcome: Progressing 08/09/2017 1854 by Verdene Rio, RN Outcome: Progressing Goal: Will not experience complications related to urinary retention 08/09/2017 1901 by Verdene Rio, RN Outcome: Progressing 08/09/2017 1854 by Verdene Rio, RN Outcome: Progressing   Problem: Pain Managment: Goal: General experience of comfort will improve 08/09/2017 1901 by Verdene Rio, RN Outcome: Progressing 08/09/2017 1854 by Verdene Rio, RN Outcome: Progressing   Problem: Safety: Goal: Ability to remain free from injury will improve 08/09/2017 1901 by Verdene Rio, RN Outcome: Progressing 08/09/2017 1854 by Verdene Rio, RN Outcome: Progressing   Problem: Skin Integrity: Goal: Risk for impaired skin integrity will decrease 08/09/2017 1901 by Verdene Rio, RN Outcome: Progressing 08/09/2017 1854 by Verdene Rio, RN Outcome: Progressing

## 2017-08-09 NOTE — ED Notes (Signed)
Pt alert and awake at this time.

## 2017-08-09 NOTE — H&P (Signed)
[name] is an 52 y.o. female.   Chief Complaint: Fall HPI: The patient with past medical history of stroke, COPD and diabetes presents to the emergency department after suffering a fall.  The patient states that she was getting into her vehicle when somehow she lost her footing and fell out of the car.  The patient denies loss of consciousness.  She immediately felt pain in her right ankle which she felt buckle underneath her weight.  X-ray of the family in the emergency department demonstrated severe fracture which prompted the emergency department staff to call the hospitalist service for admission.  Past Medical History:  Diagnosis Date  . Anxiety   . Cancer ([place])    a spot on liver and treated   . Complication of anesthesia    restless,easily upset  . COPD (chronic obstructive pulmonary disease) ([place])   . Diabetes mellitus without complication ([place])   . Headache(784.0)   . Restless   . Sleep apnea    neg test  . Stroke [facility])     Past Surgical History:  Procedure Laterality Date  . ABDOMINAL HYSTERECTOMY    . ANTERIOR CERVICAL DECOMP/DISCECTOMY FUSION N/A 03/11/2012   Procedure: ANTERIOR CERVICAL DECOMPRESSION/DISCECTOMY FUSION 1 LEVEL;  Surgeon: Ophelia Charter, MD;  Location: [facility];  Service: Neurosurgery;  Laterality: N/A;  Cervical five-six anterior cervical decompression with fusion interbody prothesis plating and bonegraft  . BACK SURGERY    . EYE SURGERY    . HERNIA REPAIR    . JOINT REPLACEMENT     bil knees  . REPLACEMENT TOTAL KNEE BILATERAL      Family History  Problem Relation Age of Onset  . Diabetes Mother   . Hypertension Mother   . Hypertension Father    Social History:  reports that she has been smoking cigarettes.  She has a 10.00 pack-year smoking history. She has never used smokeless tobacco. She reports that she has current or past drug history. Drug: Marijuana. She reports that she does not drink alcohol.  Allergies:  Allergies   Allergen Reactions  . Tramadol Nausea And Vomiting and Other (See Comments)    Increase blood pressure  . Augmentin [Amoxicillin-Pot Clavulanate] Rash    Medications Prior to Admission  Medication Sig Dispense Refill  . ALPRAZolam (XANAX) 1 MG tablet Take 1 mg by mouth 3 (three) times daily as needed for anxiety.     Marland Kitchen BYDUREON BCISE 2 MG/0.85ML AUIJ Inject 2 mg into the skin once a week. On Thursday    . citalopram (CELEXA) 40 MG tablet Take 40 mg by mouth daily.    . clopidogrel (PLAVIX) 75 MG tablet Take 75 mg by mouth daily.    Marland Kitchen dicyclomine (BENTYL) 20 MG tablet Take 20 mg by mouth 2 (two) times daily.    Eduard Roux (AIMOVIG) 70 MG/ML SOAJ Inject 70 mg into the skin.    . furosemide (LASIX) 40 MG tablet Take 40 mg by mouth daily.     Marland Kitchen gabapentin (NEURONTIN) 600 MG tablet Take 1 tablet (600 mg total) by mouth 4 (four) times daily. 360 tablet 3  . insulin aspart (NOVOLOG) 100 UNIT/ML FlexPen Inject 30 Units into the skin at bedtime.    Marland Kitchen linagliptin (TRADJENTA) 5 MG TABS tablet Take 5 mg by mouth daily.    Marland Kitchen nystatin (MYCOSTATIN/NYSTOP) powder     . Oxycodone HCl 10 MG TABS Take 1 tablet (10 mg total) by mouth 4 (four) times daily as needed (pain).  0  . OXYCONTIN 30 MG 12 hr tablet Take 30 mg by mouth 2 (two) times daily.    . pantoprazole (PROTONIX) 20 MG tablet     . pioglitazone (ACTOS) 45 MG tablet Take 45 mg by mouth daily.    . rizatriptan (MAXALT-MLT) 10 MG disintegrating tablet Take 10 mg by mouth as needed for migraine. May repeat in 2 hours if needed    . simvastatin (ZOCOR) 40 MG tablet Take 1 tablet (40 mg total) by mouth every evening. 30 tablet 0  . topiramate (TOPAMAX) 100 MG tablet Take 1 tablet (100 mg total) by mouth 2 (two) times daily. 180 tablet 4  . VENTOLIN HFA 108 (90 Base) MCG/ACT inhaler     . Vitamin D, Ergocalciferol, (DRISDOL) 50000 units CAPS capsule Take 50,000 Units by mouth every 7 (seven) days. On Wednesday    . WELCHOL 625 MG tablet Take  1,875 mg by mouth 2 (two) times daily.       Results for orders placed or performed during the hospital encounter of 08/08/17 (from the past 48 hour(s))  Basic metabolic panel     Status: Abnormal   Collection Time: 08/09/17  2:15 AM  Result Value Ref Range   Sodium 134 (L) 135 - 145 mmol/L   Potassium 4.1 3.5 - 5.1 mmol/L   Chloride 101 98 - 111 mmol/L   CO2 26 22 - 32 mmol/L   Glucose, Bld 349 (H) 70 - 99 mg/dL   BUN 14 6 - 20 mg/dL   Creatinine, Ser 0.76 0.44 - 1.00 mg/dL   Calcium 8.7 (L) 8.9 - 10.3 mg/dL   GFR calc non Af Amer >60 >60 mL/min   GFR calc Af Amer >60 >60 mL/min    Comment: (NOTE) The eGFR has been calculated using the CKD EPI equation. This calculation has not been validated in all clinical situations. eGFR's persistently <60 mL/min signify possible Chronic Kidney Disease.    Anion gap 7 5 - 15    Comment: Performed at [facility], [address] [place], [place] [address]  CBC     Status: Abnormal   Collection Time: 08/09/17  2:15 AM  Result Value Ref Range   WBC 11.7 (H) 3.6 - 11.0 K/uL   RBC 4.85 3.80 - 5.20 MIL/uL   Hemoglobin 14.2 12.0 - 16.0 g/dL   HCT 42.5 35.0 - 47.0 %   MCV 87.6 80.0 - 100.0 fL   MCH 29.3 26.0 - 34.0 pg   MCHC 33.5 32.0 - 36.0 g/dL   RDW 14.5 11.5 - 14.5 %   Platelets 285 150 - 440 K/uL    Comment: Performed at [facility], [address] [place], [place] [address]  TSH     Status: None   Collection Time: 08/09/17  2:15 AM  Result Value Ref Range   TSH 1.086 0.350 - 4.500 uIU/mL    Comment: Performed by a 3rd Generation assay with a functional sensitivity of <=0.01 uIU/mL. Performed at [facility], [address] [place], [place] [address]    Dg Ankle Complete Right  Result Date: 08/09/2017 CLINICAL DATA:  Fall today with right ankle pain and deformity. EXAM: RIGHT ANKLE - COMPLETE 3+ VIEW COMPARISON:  None. FINDINGS: Comminuted and displaced fracture of the distal tibia involves the  posterior and medial malleolus and extends into the tibial talar joint. Mildly displaced distal fibular fracture just proximal to the ankle mortise. There is diffuse soft tissue edema about the ankle.  IMPRESSION: Comminuted and displaced trimalleolar fracture. Electronically Signed   By: Jeb Levering M.D.   On: 08/09/2017 00:59    Review of Systems  Constitutional: Negative for chills and fever.  HENT: Negative for sore throat and tinnitus.   Eyes: Negative for blurred vision and redness.  Respiratory: Negative for cough and shortness of breath.   Cardiovascular: Negative for chest pain, palpitations, orthopnea and PND.  Gastrointestinal: Negative for abdominal pain, diarrhea, nausea and vomiting.  Genitourinary: Negative for dysuria, frequency and urgency.  Musculoskeletal: Positive for falls and joint pain. Negative for myalgias.  Skin: Negative for rash.       No lesions  Neurological: Negative for speech change, focal weakness and weakness.  Endo/Heme/Allergies: Does not bruise/bleed easily.       No temperature intolerance  Psychiatric/Behavioral: Negative for depression and suicidal ideas.    Blood pressure 111/71, pulse 93, temperature 97.8 F (36.6 C), temperature source Oral, resp. rate 18, height _0  (1.676 m), weight (!) 147.6 kg (325 lb 6.4 oz), SpO2 96 %. Physical Exam  Vitals reviewed. Constitutional: She is oriented to person, place, and time. She appears well-developed and well-nourished. No distress.  HENT:  Head: Normocephalic and atraumatic.  Mouth/Throat: Oropharynx is clear and moist.  Eyes: Pupils are equal, round, and reactive to light. Conjunctivae and EOM are normal. No scleral icterus.  Neck: Normal range of motion. Neck supple. No JVD present. No tracheal deviation present. No thyromegaly present.  Cardiovascular: Normal rate, regular rhythm and normal heart sounds. Exam reveals no gallop and no friction rub.  No murmur heard. Respiratory: Effort  normal and breath sounds normal.  GI: Soft. Bowel sounds are normal. She exhibits no distension. There is no tenderness.  Musculoskeletal: She exhibits edema and tenderness.  Swelling tenderness and decreased range of motion due to pain of right ankle  Lymphadenopathy:    She has no cervical adenopathy.  Neurological: She is alert and oriented to person, place, and time. No cranial nerve deficit. She exhibits normal muscle tone.  Skin: Skin is warm and dry. No rash noted. No erythema.  Psychiatric: She has a normal mood and affect. Her behavior is normal. Judgment and thought content normal.     Assessment/Plan This is a 52 year old female admitted for ankle fracture. 1.  Right ankle fracture: Trimalleolar; manage severe pain with IV morphine.  Orthopedic surgery to reduce and immobilize joint. 2.  Diabetes mellitus type 2: Hold oral hypoglycemic agents.  Sliding scale insulin while hospitalized 3.  Cerebrovascular disease: History of stroke; continue Plavix.  She is always had residual weakness on her right side since her stroke. 4.  Depression: Stable; continue Celexa and Ativan as needed 5.  DVT prophylaxis: Lovenox 6.  GI prophylaxis: None The patient is a full code.  Time spent on admission orders and patient care approximately 45 minutes  Harrie Foreman, MD 08/09/2017, 7:01 AM

## 2017-08-09 NOTE — Progress Notes (Addendum)
[facility] [place] NAME: [name]    MR#:  [id number]  DATE OF BIRTH:  [birth year]  SUBJECTIVE:  CHIEF COMPLAINT: Patient is resting comfortably.  Pain is okay.  Husband at bedside  REVIEW OF SYSTEMS:  CONSTITUTIONAL: No fever, fatigue or weakness.  EYES: No blurred or double vision.  EARS, NOSE, AND THROAT: No tinnitus or ear pain.  RESPIRATORY: No cough, shortness of breath, wheezing or hemoptysis.  CARDIOVASCULAR: No chest pain, orthopnea, edema.  GASTROINTESTINAL: No nausea, vomiting, diarrhea or abdominal pain.  GENITOURINARY: No dysuria, hematuria.  ENDOCRINE: No polyuria, nocturia,  HEMATOLOGY: No anemia, easy bruising or bleeding SKIN: No rash or lesion. MUSCULOSKELETAL: Right ankle pain  NEUROLOGIC: No tingling, numbness, weakness.  PSYCHIATRY: No anxiety or depression.   DRUG ALLERGIES:   Allergies  Allergen Reactions  . Tramadol Nausea And Vomiting and Other (See Comments)    Increase blood pressure  . Augmentin [Amoxicillin-Pot Clavulanate] Rash    VITALS:  Blood pressure 128/80, pulse 100, temperature 98.2 F (36.8 C), temperature source Oral, resp. rate 20, height 5\' 6"  (1.676 m), weight (!) 147.6 kg (325 lb 6.4 oz), SpO2 90 %.  PHYSICAL EXAMINATION:  GENERAL:  52 y.o.-year-old patient lying in the bed with no acute distress.  EYES: Pupils equal, round, reactive to light and accommodation. No scleral icterus. Extraocular muscles intact.  HEENT: Head atraumatic, normocephalic. Oropharynx and nasopharynx clear.  NECK:  Supple, no jugular venous distention. No thyroid enlargement, no tenderness.  LUNGS: Normal breath sounds bilaterally, no wheezing, rales,rhonchi or crepitation. No use of accessory muscles of respiration.  CARDIOVASCULAR: S1, S2 normal. No murmurs, rubs, or gallops.  ABDOMEN: Soft, nontender, nondistended. Bowel sounds present. No organomegaly or mass.  EXTREMITIES: Right ankle is tender in  the splint no pedal edema, cyanosis, or clubbing.  NEUROLOGIC: Cranial nerves II through XII are intact. Sensation intact. Gait not checked.  PSYCHIATRIC: The patient is alert and oriented x 3.  SKIN: No obvious rash, lesion, or ulcer.    LABORATORY PANEL:   CBC Recent Labs  Lab 08/09/17 0215  WBC 11.7*  HGB 14.2  HCT 42.5  PLT 285   ------------------------------------------------------------------------------------------------------------------  Chemistries  Recent Labs  Lab 08/09/17 0215  NA 134*  K 4.1  CL 101  CO2 26  GLUCOSE 349*  BUN 14  CREATININE 0.76  CALCIUM 8.7*   ------------------------------------------------------------------------------------------------------------------  Cardiac Enzymes No results for input(s): TROPONINI in the last 168 hours. ------------------------------------------------------------------------------------------------------------------  RADIOLOGY:  Dg Ankle 2 Views Right  Result Date: 08/09/2017 CLINICAL DATA:  Postreduction EXAM: RIGHT ANKLE - 2 VIEW COMPARISON:  Right ankle radiographs from 1 day prior FINDINGS: Overlying cast obscures fine bone detail. Nondisplaced lateral malleolus fracture. No significant displacement at the posterior or medial malleolar fractures. Slight widening at the medial ankle mortise without significant subluxation. No suspicious focal osseous lesions. No radiopaque foreign body. IMPRESSION: Nondisplaced trimalleolar right ankle fractures. Slight widening at the medial ankle mortise without significant subluxation. Electronically Signed   By: Ilona Sorrel M.D.   On: 08/09/2017 09:32   Dg Ankle Complete Right  Result Date: 08/09/2017 CLINICAL DATA:  Fall today with right ankle pain and deformity. EXAM: RIGHT ANKLE - COMPLETE 3+ VIEW COMPARISON:  None. FINDINGS: Comminuted and displaced fracture of the distal tibia involves the posterior and medial malleolus and extends into the tibial talar joint. Mildly  displaced distal fibular fracture just proximal to the ankle mortise. There is diffuse soft tissue edema  about the ankle. IMPRESSION: Comminuted and displaced trimalleolar fracture. Electronically Signed   By: Jeb Levering M.D.   On: 08/09/2017 00:59   Ct Ankle Right Wo Contrast  Result Date: 08/09/2017 CLINICAL DATA:  Evaluate right ankle fractures. EXAM: CT OF THE RIGHT ANKLE WITHOUT CONTRAST TECHNIQUE: Multidetector CT imaging of the right ankle was performed according to the standard protocol. Multiplanar CT image reconstructions were also generated. COMPARISON:  Radiographs 08/09/2017 FINDINGS: Advanced osteoporosis for the patient's age. Comminuted intra-articular fractures of the tibia. There are longitudinal and oblique components extending down to the articular surface. Maximum separation of the articular fragments is 6.5 mm. Slight depression of the posterior malleolar fracture up to 2.5 mm. Oblique fracture through the base of the medial malleolus without significant displacement. There is also a small avulsion type fracture involving the lateral aspect of the tibial epiphysis. Nondisplaced fracture involving the distal fibular shaft 3 cm above the ankle mortise. No fracture of the talar dome or neck. There is a small avulsion fracture along the lateral aspect of the talus. The ankle mortise is fairly well maintained. The subtalar joints are maintained. Os trigonum is noted. The sinus tarsi appears normal. IMPRESSION: 1. Complex comminuted intra-articular fractures of the distal tibia as described above. 2. Nondisplaced transverse fracture through the distal fibular shaft. 3. Small avulsion fracture involving the lateral talus. 4. Advanced osteoporosis for age. Electronically Signed   By: Marijo Sanes M.D.   On: 08/09/2017 11:01    EKG:   Orders placed or performed during the hospital encounter of 08/08/17  . ED EKG  . ED EKG  . EKG 12-Lead  . EKG 12-Lead    ASSESSMENT AND PLAN:     This is a 52 year old female admitted for ankle fracture. # .  Right ankle fracture: Trimalleolar; status post closed reduction in the emergency department manage severe pain with IV morphine.   Orthopedic surgery dr.Durrani is following the patient, Dr. Marry Guan might consider doing the surgery tomorrow as per my discussion with Dr. Donivan Scull. Patient's Plavix and Lovenox subcu are on hold CT scan has revealed 1. Complex comminuted intra-articular fractures of the distal tibia as described above. 2. Nondisplaced transverse fracture through the distal fibular shaft. 3. Small avulsion fracture involving the lateral talus.  # Diabetes mellitus type 2: Hold oral hypoglycemic agents.  Sliding scale insulin while hospitalized  #.  Cerebrovascular disease: History of stroke; holding  Plavix for now, will resume soon after surgery.  She is always had residual weakness on her right side since her stroke.  4.  Depression: Stable; continue Celexa and Ativan as needed  5.  DVT prophylaxis: scd  6.  GI prophylaxis: None The patient is a full code    All the records are reviewed and case discussed with Care Management/Social Workerr. Management plans discussed with the patient, family and they are in agreement.  CODE STATUS: Full code  TOTAL TIME TAKING CARE OF THIS PATIENT: 85minutes.   POSSIBLE D/C IN 2 DAYS, DEPENDING ON CLINICAL CONDITION.  Note: This dictation was prepared with Dragon dictation along with smaller phrase technology. Any transcriptional errors that result from this process are unintentional.   Nicholes Mango M.D on 08/09/2017 at 1:56 PM  Between 7am to 6pm - Pager - [phone] After 6pm go to [email or web address] - password [email or web address] [facility]  Office  [phone]  CC: Primary care physician; Remi Haggard, FNP

## 2017-08-10 ENCOUNTER — Observation Stay: Payer: Medicare Other | Admitting: Certified Registered Nurse Anesthetist

## 2017-08-10 ENCOUNTER — Encounter
Admission: EM | Disposition: A | Discharge status: Home Health | Payer: Self-pay | Source: Home / Self Care | Attending: Internal Medicine

## 2017-08-10 ENCOUNTER — Observation Stay: Payer: Medicare Other

## 2017-08-10 HISTORY — PX: EXTERNAL FIXATION LEG: SHX1549

## 2017-08-10 LAB — SURGICAL PCR SCREEN
MRSA, PCR: NEGATIVE
STAPHYLOCOCCUS AUREUS: POSITIVE — AB

## 2017-08-10 LAB — GLUCOSE, CAPILLARY
Glucose-Capillary: 289 mg/dL — ABNORMAL HIGH (ref 70–99)
Glucose-Capillary: 299 mg/dL — ABNORMAL HIGH (ref 70–99)
Glucose-Capillary: 237 mg/dL — ABNORMAL HIGH (ref 70–99)
Glucose-Capillary: 258 mg/dL — ABNORMAL HIGH (ref 70–99)

## 2017-08-10 SURGERY — EXTERNAL FIXATION, LOWER EXTREMITY
Anesthesia: General | Laterality: Right

## 2017-08-10 MED ORDER — FENTANYL CITRATE (PF) 100 MCG/2ML IJ SOLN
INTRAMUSCULAR | Status: DC | PRN
  Administered 2017-08-10: 50 ug via INTRAVENOUS
  Administered 2017-08-10: 100 ug via INTRAVENOUS
  Administered 2017-08-10: 50 ug via INTRAVENOUS

## 2017-08-10 MED ORDER — INSULIN GLARGINE 100 UNIT/ML ~~LOC~~ SOLN
15.0000 [IU] | Freq: Every day | SUBCUTANEOUS | Status: DC
  Filled 2017-08-10 (×2): qty 0.15

## 2017-08-10 MED ORDER — PROPOFOL 10 MG/ML IV BOLUS
INTRAVENOUS | Status: DC | PRN
  Administered 2017-08-10: 200 mg via INTRAVENOUS

## 2017-08-10 MED ORDER — FENTANYL CITRATE (PF) 100 MCG/2ML IJ SOLN
INTRAMUSCULAR | Status: AC
  Administered 2017-08-10: 25 ug via INTRAVENOUS
  Filled 2017-08-10: qty 2

## 2017-08-10 MED ORDER — PROPOFOL 10 MG/ML IV BOLUS
INTRAVENOUS | Status: AC
  Filled 2017-08-10: qty 20

## 2017-08-10 MED ORDER — ONDANSETRON HCL 4 MG/2ML IJ SOLN: 4.0000 mg | Freq: Once | INTRAMUSCULAR | Status: DC | PRN

## 2017-08-10 MED ORDER — ONDANSETRON HCL 4 MG/2ML IJ SOLN
INTRAMUSCULAR | Status: DC | PRN
  Administered 2017-08-10: 4 mg via INTRAVENOUS

## 2017-08-10 MED ORDER — FENTANYL CITRATE (PF) 100 MCG/2ML IJ SOLN
25.0000 ug | INTRAMUSCULAR | Status: AC | PRN
  Administered 2017-08-10 (×6): 25 ug via INTRAVENOUS

## 2017-08-10 MED ORDER — OXYCODONE HCL ER 15 MG PO T12A
30.0000 mg | EXTENDED_RELEASE_TABLET | Freq: Two times a day (BID) | ORAL | Status: DC
  Administered 2017-08-10 – 2017-08-11 (×3): 30 mg via ORAL
  Filled 2017-08-10 (×3): qty 2

## 2017-08-10 MED ORDER — MIDAZOLAM HCL 2 MG/2ML IJ SOLN
INTRAMUSCULAR | Status: AC
  Filled 2017-08-10: qty 2

## 2017-08-10 MED ORDER — SUGAMMADEX SODIUM 200 MG/2ML IV SOLN
INTRAVENOUS | Status: DC | PRN
  Administered 2017-08-10: 300 mg via INTRAVENOUS

## 2017-08-10 MED ORDER — LABETALOL HCL 5 MG/ML IV SOLN
INTRAVENOUS | Status: DC | PRN
  Administered 2017-08-10: 5 mg via INTRAVENOUS

## 2017-08-10 MED ORDER — CLINDAMYCIN PHOSPHATE 600 MG/50ML IV SOLN
600.0000 mg | INTRAVENOUS | Status: DC
  Filled 2017-08-10: qty 50

## 2017-08-10 MED ORDER — CLINDAMYCIN PHOSPHATE 600 MG/50ML IV SOLN
INTRAVENOUS | Status: DC | PRN
  Administered 2017-08-10: 600 mg via INTRAVENOUS

## 2017-08-10 MED ORDER — ALBUTEROL SULFATE HFA 108 (90 BASE) MCG/ACT IN AERS
INHALATION_SPRAY | RESPIRATORY_TRACT | Status: DC | PRN
  Administered 2017-08-10 (×2): 4 via RESPIRATORY_TRACT

## 2017-08-10 MED ORDER — MORPHINE SULFATE (PF) 4 MG/ML IV SOLN
4.0000 mg | Freq: Four times a day (QID) | INTRAVENOUS | Status: DC | PRN
  Administered 2017-08-10: 4 mg via INTRAVENOUS
  Filled 2017-08-10: qty 1

## 2017-08-10 MED ORDER — FENTANYL CITRATE (PF) 100 MCG/2ML IJ SOLN
INTRAMUSCULAR | Status: AC
  Filled 2017-08-10: qty 2

## 2017-08-10 MED ORDER — INSULIN GLARGINE 100 UNIT/ML ~~LOC~~ SOLN
30.0000 [IU] | Freq: Every day | SUBCUTANEOUS | Status: DC
  Filled 2017-08-10: qty 0.3

## 2017-08-10 MED ORDER — ALBUTEROL SULFATE (2.5 MG/3ML) 0.083% IN NEBU
INHALATION_SOLUTION | RESPIRATORY_TRACT | Status: AC
  Administered 2017-08-10: 2.5 mg via RESPIRATORY_TRACT
  Filled 2017-08-10: qty 3

## 2017-08-10 MED ORDER — ROCURONIUM BROMIDE 100 MG/10ML IV SOLN
INTRAVENOUS | Status: DC | PRN
  Administered 2017-08-10: 5 mg via INTRAVENOUS

## 2017-08-10 MED ORDER — ALBUTEROL SULFATE (2.5 MG/3ML) 0.083% IN NEBU
2.5000 mg | INHALATION_SOLUTION | Freq: Once | RESPIRATORY_TRACT | Status: AC
  Administered 2017-08-10: 2.5 mg via RESPIRATORY_TRACT

## 2017-08-10 MED ORDER — LACTATED RINGERS IV SOLN
INTRAVENOUS | Status: DC | PRN
  Administered 2017-08-10: 21:00:00 via INTRAVENOUS

## 2017-08-10 MED ORDER — MUPIROCIN 2 % EX OINT
1.0000 "application " | TOPICAL_OINTMENT | Freq: Two times a day (BID) | CUTANEOUS | Status: DC
  Administered 2017-08-10 – 2017-08-11 (×2): 1 via NASAL
  Filled 2017-08-10: qty 22

## 2017-08-10 MED ORDER — MIDAZOLAM HCL 2 MG/2ML IJ SOLN
INTRAMUSCULAR | Status: DC | PRN
  Administered 2017-08-10: 2 mg via INTRAVENOUS

## 2017-08-10 SURGICAL SUPPLY — 44 items
6 pin clamp ×1 IMPLANT
BANDAGE ACE 4X5 VEL STRL LF (GAUZE/BANDAGES/DRESSINGS) ×2 IMPLANT
BNDG COHESIVE 4X5 TAN STRL (GAUZE/BANDAGES/DRESSINGS) ×2 IMPLANT
BNDG ESMARK 6X12 TAN STRL LF (GAUZE/BANDAGES/DRESSINGS) ×1 IMPLANT
CLAMP LG COMBINATION (Clamp) ×8 IMPLANT
CLAMP PIN LRG 6 POSITION (Clamp) ×1 IMPLANT
CUFF TOURN 24 STER (MISCELLANEOUS) IMPLANT
CUFF TOURN 30 STER DUAL PORT (MISCELLANEOUS) IMPLANT
DRAPE FLUOR MINI C-ARM 54X84 (DRAPES) ×1 IMPLANT
DRAPE SHEET LG 3/4 BI-LAMINATE (DRAPES) ×2 IMPLANT
DRSG DERMACEA 8X12 NADH (GAUZE/BANDAGES/DRESSINGS) ×2 IMPLANT
DURAPREP 26ML APPLICATOR (WOUND CARE) ×2 IMPLANT
ELECT CAUTERY BLADE 6.4 (BLADE) IMPLANT
ELECT REM PT RETURN 9FT ADLT (ELECTROSURGICAL)
ELECTRODE REM PT RTRN 9FT ADLT (ELECTROSURGICAL) ×1 IMPLANT
GAUZE SPONGE 4X4 12PLY STRL (GAUZE/BANDAGES/DRESSINGS) ×1 IMPLANT
GLOVE BIOGEL M STRL SZ7.5 (GLOVE) ×4 IMPLANT
GLOVE INDICATOR 8.0 STRL GRN (GLOVE) ×4 IMPLANT
GOWN STRL REUS W/ TWL LRG LVL3 (GOWN DISPOSABLE) ×2 IMPLANT
GOWN STRL REUS W/TWL LRG LVL3 (GOWN DISPOSABLE) ×4
KIT TURNOVER KIT A (KITS) ×2 IMPLANT
LABEL OR SOLS (LABEL) ×1 IMPLANT
NS IRRIG 500ML POUR BTL (IV SOLUTION) ×2 IMPLANT
OUTRIG POST STR 11MM (MISCELLANEOUS) ×4
PACK EXTREMITY ARMC (MISCELLANEOUS) ×2 IMPLANT
PAD ABD DERMACEA PRESS 5X9 (GAUZE/BANDAGES/DRESSINGS) ×1 IMPLANT
PAD CAST CTTN 4X4 STRL (SOFTGOODS) IMPLANT
PAD PREP 24X41 OB/GYN DISP (PERSONAL CARE ITEMS) ×2 IMPLANT
PADDING CAST COTTON 4X4 STRL (SOFTGOODS) ×4
PIN STEINMAN THR 5.0 (PIN) ×1 IMPLANT
POST OUTRIGGER STR 11MM (MISCELLANEOUS) ×2 IMPLANT
SCREW SHANZ 5.0X175MM (Screw) ×2 IMPLANT
SLEEVE SCD COMPRESS THIGH MED (MISCELLANEOUS) ×1 IMPLANT
SOL PREP PVP 2OZ (MISCELLANEOUS)
SOLUTION PREP PVP 2OZ (MISCELLANEOUS) ×1 IMPLANT
SPONGE LAP 18X18 RF (DISPOSABLE) ×1 IMPLANT
STAPLER SKIN PROX 35W (STAPLE) IMPLANT
STOCKINETTE STRL 6IN 960660 (GAUZE/BANDAGES/DRESSINGS) ×2 IMPLANT
SUT VIC AB 0 CT1 36 (SUTURE) IMPLANT
SUT VIC AB 2-0 SH 27 (SUTURE)
SUT VIC AB 2-0 SH 27XBRD (SUTURE) IMPLANT
central thread transfixing pin ×2 IMPLANT
large combination clamp ×2 IMPLANT
straight outrigger clamp ×2 IMPLANT

## 2017-08-10 NOTE — Progress Notes (Signed)
[facility] [place] NAME: [name]    MR#:  [id number]  DATE OF BIRTH:  [birth year]  SUBJECTIVE:  CHIEF COMPLAINT: Patient is resting comfortably.  Pain is okay.  OxyContin 30 mg her more at home medication is added to the regimen   REVIEW OF SYSTEMS:  CONSTITUTIONAL: No fever, fatigue or weakness.  EYES: No blurred or double vision.  EARS, NOSE, AND THROAT: No tinnitus or ear pain.  RESPIRATORY: No cough, shortness of breath, wheezing or hemoptysis.  CARDIOVASCULAR: No chest pain, orthopnea, edema.  GASTROINTESTINAL: No nausea, vomiting, diarrhea or abdominal pain.  GENITOURINARY: No dysuria, hematuria.  ENDOCRINE: No polyuria, nocturia,  HEMATOLOGY: No anemia, easy bruising or bleeding SKIN: No rash or lesion. MUSCULOSKELETAL: Right ankle pain  NEUROLOGIC: No tingling, numbness, weakness.  PSYCHIATRY: No anxiety or depression.   DRUG ALLERGIES:   Allergies  Allergen Reactions  . Tramadol Nausea And Vomiting and Other (See Comments)    Increase blood pressure  . Augmentin [Amoxicillin-Pot Clavulanate] Rash    VITALS:  Blood pressure 129/84, pulse (!) 102, temperature 98.6 F (37 C), temperature source Oral, resp. rate 20, height 5\' 6"  (1.676 m), weight (!) 148 kg (326 lb 4.5 oz), SpO2 94 %.  PHYSICAL EXAMINATION:  GENERAL:  52 y.o.-year-old patient lying in the bed with no acute distress.  EYES: Pupils equal, round, reactive to light and accommodation. No scleral icterus. Extraocular muscles intact.  HEENT: Head atraumatic, normocephalic. Oropharynx and nasopharynx clear.  NECK:  Supple, no jugular venous distention. No thyroid enlargement, no tenderness.  LUNGS: Normal breath sounds bilaterally, no wheezing, rales,rhonchi or crepitation. No use of accessory muscles of respiration.  CARDIOVASCULAR: S1, S2 normal. No murmurs, rubs, or gallops.  ABDOMEN: Soft, nontender, nondistended. Bowel sounds present. No  organomegaly or mass.  EXTREMITIES: Right ankle is tender in the splint no pedal edema, cyanosis, or clubbing.  NEUROLOGIC: Cranial nerves II through XII are intact. Sensation intact. Gait not checked.  PSYCHIATRIC: The patient is alert and oriented x 3.  SKIN: No obvious rash, lesion, or ulcer.    LABORATORY PANEL:   CBC Recent Labs  Lab 08/09/17 0215  WBC 11.7*  HGB 14.2  HCT 42.5  PLT 285   ------------------------------------------------------------------------------------------------------------------  Chemistries  Recent Labs  Lab 08/09/17 0215  NA 134*  K 4.1  CL 101  CO2 26  GLUCOSE 349*  BUN 14  CREATININE 0.76  CALCIUM 8.7*   ------------------------------------------------------------------------------------------------------------------  Cardiac Enzymes No results for input(s): TROPONINI in the last 168 hours. ------------------------------------------------------------------------------------------------------------------  RADIOLOGY:  Dg Ankle 2 Views Right  Result Date: 08/09/2017 CLINICAL DATA:  Postreduction EXAM: RIGHT ANKLE - 2 VIEW COMPARISON:  Right ankle radiographs from 1 day prior FINDINGS: Overlying cast obscures fine bone detail. Nondisplaced lateral malleolus fracture. No significant displacement at the posterior or medial malleolar fractures. Slight widening at the medial ankle mortise without significant subluxation. No suspicious focal osseous lesions. No radiopaque foreign body. IMPRESSION: Nondisplaced trimalleolar right ankle fractures. Slight widening at the medial ankle mortise without significant subluxation. Electronically Signed   By: Ilona Sorrel M.D.   On: 08/09/2017 09:32   Dg Ankle Complete Right  Result Date: 08/09/2017 CLINICAL DATA:  Fall today with right ankle pain and deformity. EXAM: RIGHT ANKLE - COMPLETE 3+ VIEW COMPARISON:  None. FINDINGS: Comminuted and displaced fracture of the distal tibia involves the posterior and  medial malleolus and extends into the tibial talar joint. Mildly displaced distal fibular fracture  just proximal to the ankle mortise. There is diffuse soft tissue edema about the ankle. IMPRESSION: Comminuted and displaced trimalleolar fracture. Electronically Signed   By: Jeb Levering M.D.   On: 08/09/2017 00:59   Ct Ankle Right Wo Contrast  Result Date: 08/09/2017 CLINICAL DATA:  Evaluate right ankle fractures. EXAM: CT OF THE RIGHT ANKLE WITHOUT CONTRAST TECHNIQUE: Multidetector CT imaging of the right ankle was performed according to the standard protocol. Multiplanar CT image reconstructions were also generated. COMPARISON:  Radiographs 08/09/2017 FINDINGS: Advanced osteoporosis for the patient's age. Comminuted intra-articular fractures of the tibia. There are longitudinal and oblique components extending down to the articular surface. Maximum separation of the articular fragments is 6.5 mm. Slight depression of the posterior malleolar fracture up to 2.5 mm. Oblique fracture through the base of the medial malleolus without significant displacement. There is also a small avulsion type fracture involving the lateral aspect of the tibial epiphysis. Nondisplaced fracture involving the distal fibular shaft 3 cm above the ankle mortise. No fracture of the talar dome or neck. There is a small avulsion fracture along the lateral aspect of the talus. The ankle mortise is fairly well maintained. The subtalar joints are maintained. Os trigonum is noted. The sinus tarsi appears normal. IMPRESSION: 1. Complex comminuted intra-articular fractures of the distal tibia as described above. 2. Nondisplaced transverse fracture through the distal fibular shaft. 3. Small avulsion fracture involving the lateral talus. 4. Advanced osteoporosis for age. Electronically Signed   By: Marijo Sanes M.D.   On: 08/09/2017 11:01    EKG:   Orders placed or performed during the hospital encounter of 08/08/17  . ED EKG  . ED EKG   . EKG 12-Lead  . EKG 12-Lead    ASSESSMENT AND PLAN:    This is a 52 year old female admitted for ankle fracture. # .  Right ankle fracture: Trimalleolar; status post closed reduction in the emergency department manage severe pain with IV morphine as needed  Orthopedic surgery dr.Durrani is following the patient, Dr. Marry Guan is planning to do the surgery tomorrow  Patient's Plavix and Lovenox subcu are on hold OxyContin 30 mg p.o. every 12 hours is added to the regimen which is at home medication CT scan has revealed 1. Complex comminuted intra-articular fractures of the distal tibia as described above. 2. Nondisplaced transverse fracture through the distal fibular shaft. 3. Small avulsion fracture involving the lateral talus.  # Diabetes mellitus type 2: Hold oral hypoglycemic agents.  Sliding scale insulin while hospitalized.  Lantus 15 units is added to the regimen for basal coverage while patient is n.p.o. after midnight for surgery in a.m.  #.  Cerebrovascular disease: History of stroke; holding  Plavix for now, will resume soon after surgery.  She is always had residual weakness on her right side since her stroke.  4.  Depression: Stable; continue Celexa and Ativan as needed  5.  DVT prophylaxis: scd  6.  GI prophylaxis: None The patient is a full code    All the records are reviewed and case discussed with Care Management/Social Workerr. Management plans discussed with the patient, family and they are in agreement.  CODE STATUS: Full code  TOTAL TIME TAKING CARE OF THIS PATIENT: 56minutes.   POSSIBLE D/C IN 2 DAYS, DEPENDING ON CLINICAL CONDITION.  Note: This dictation was prepared with Dragon dictation along with smaller phrase technology. Any transcriptional errors that result from this process are unintentional.   Nicholes Mango M.D on 08/10/2017 at 3:27 PM  Between 7am to 6pm - Pager - [phone] After 6pm go to [email or web address] - password [email or web address] [facility]  Office  [phone]  CC: Primary care physician; Remi Haggard, FNP

## 2017-08-10 NOTE — Progress Notes (Signed)
Subjective :Patient is a 2 admission 4 fracture dislocation right ankle. Patient reports pain as moderate to severe.   no nausea and no vomiting Denies any chest pains or shortness of breath.  Also having some pain to the left foot now.    Objective: Vital signs in last 24 hours: Temp:  [98.6 F (37 C)-99.6 F (37.6 C)] 98.6 F (37 C) (07/29 0834) Pulse Rate:  [98-104] 102 (07/29 0834) Resp:  [18-20] 20 (07/28 2059) BP: (122-132)/(77-84) 129/84 (07/29 0834) SpO2:  [90 %-94 %] 94 % (07/29 0834) Weight:  [148 kg (326 lb 4.5 oz)] 148 kg (326 lb 4.5 oz) (07/29 0500) Patient right lower extremity is elevated on 2 pillows.  Ice packs initially were not applied but are there now.  Patient does have diabetic peripheral neuropathy.  Moving toes well.  Capillary refill appeared to be within normal limits.  Intake/Output from previous day: 07/28 0701 - 07/29 0700 In: -  Out: 1575  Intake/Output this shift: No intake/output data recorded.  Recent Labs    08/09/17 0215  HGB 14.2   Recent Labs    08/09/17 0215  WBC 11.7*  RBC 4.85  HCT 42.5  PLT 285   Recent Labs    08/09/17 0215  NA 134*  K 4.1  CL 101  CO2 26  BUN 14  CREATININE 0.76  GLUCOSE 349*  CALCIUM 8.7*   No results for input(s): LABPT, INR in the last 72 hours.  Neurologically intact Neurovascular intact Sensation intact distally Intact pulses distally Dorsiflexion/Plantar flexion intact Compartment soft  Assessment/Plan: Dr. Marry Guan has contacted Dr. Ginette Pitman agrees for trauma specialist who is excepted to see this patient on Wednesday.  The meantime will apply a external fixation either today or tomorrow.  Patient has been notified of this. Case management to assist with discharge planning Plan to discharge either tomorrow or Wednesday.   WOLFE,JON R. 08/10/2017, 10:53 AM

## 2017-08-10 NOTE — Op Note (Signed)
OPERATIVE NOTE  DATE OF SURGERY:  08/10/2017  PATIENT NAME:  [name]   DOB: [birth year]  MRN: [id number]   PRE-OPERATIVE DIAGNOSIS: Right trimalleolar/pilon ankle fracture  POST-OPERATIVE DIAGNOSIS:  Same  PROCEDURE: Closed reduction and external fixation of the right trimalleolar/pilon ankle fracture  SURGEON:  Marciano Sequin., M.D.   ANESTHESIA: general  ESTIMATED BLOOD LOSS: Minimal  FLUIDS REPLACED: 500 mL of crystalloid  IMPLANTS UTILIZED: Synthes large external fixator  INDICATIONS FOR SURGERY: [name] is a 51 y.o. year old female who fell and sustained a comminuted right trimalleolar ankle fracture with CT scan documenting intra-articular extension consistent with the pilon fracture.. After discussion of the risks and benefits of surgical intervention, the patient expressed understanding of the risks benefits and agree with plans for closed reduction and external fixation of the right ankle.   PROCEDURE IN DETAIL: The patient was brought into the operating room and, after adequate general endotracheal anesthesia was achieved, a timeout was performed as per usual protocol.  The patient's right lower extremity was cleaned and prepped with alcohol and DuraPrep and draped usual sterile fashion.  A 5.0 mm transfixion pin was inserted through the calcaneus with position confirmed using C arm.  Next, 2 5.0 mm Schanz pins were inserted into the antero-medial tibia.  A delta frame was constructed and the fracture was reduced.  Good reduction was appreciated both AP and lateral views using the C arm.  The locking screws were tightened accordingly.  A dressing was applied.  Patient tolerated procedure well.  She was transported to the recovery room in stable condition.     James P. Holley Bouche M.D.

## 2017-08-10 NOTE — Anesthesia Post-op Follow-up Note (Signed)
Anesthesia QCDR form completed.        

## 2017-08-10 NOTE — Anesthesia Procedure Notes (Signed)
Procedure Name: Intubation Date/Time: 08/10/2017 8:47 PM Performed by: Lendon Colonel, CRNA Pre-anesthesia Checklist: Patient identified, Patient being monitored, Timeout performed, Emergency Drugs available and Suction available Patient Re-evaluated:Patient Re-evaluated prior to induction Oxygen Delivery Method: Circle system utilized Preoxygenation: Pre-oxygenation with 100% oxygen Induction Type: IV induction Ventilation: Mask ventilation without difficulty Laryngoscope Size: Miller and 2 Grade View: Grade II Tube type: Oral Tube size: 7.0 mm Number of attempts: 1 Airway Equipment and Method: Stylet Placement Confirmation: ETT inserted through vocal cords under direct vision,  positive ETCO2 and breath sounds checked- equal and bilateral Secured at: 21 cm Tube secured with: Tape Dental Injury: Teeth and Oropharynx as per pre-operative assessment

## 2017-08-10 NOTE — Progress Notes (Signed)
Subjective :Patient is day 2 fracture dislocation right ankle Patient reports pain as moderate to severe.   no nausea and no vomiting Denies any chest pains or shortness of breath    Objective: Vital signs in last 24 hours: Temp:  [98.6 F (37 C)-99.6 F (37.6 C)] 98.6 F (37 C) (07/29 0834) Pulse Rate:  [98-104] 102 (07/29 0834) Resp:  [18-20] 20 (07/28 2059) BP: (122-132)/(77-84) 129/84 (07/29 0834) SpO2:  [90 %-94 %] 94 % (07/29 0834) Weight:  [148 kg (326 lb 4.5 oz)] 148 kg (326 lb 4.5 oz) (07/29 0500) Right lower extremity is elevated on 2 pillows.  Ice packs are in place.  Moving toes well.  Appears to have normal sensation to touch.  Capillary refill appear to be intact and within normal limits.  Intake/Output from previous day: 07/28 0701 - 07/29 0700 In: -  Out: 1575  Intake/Output this shift: No intake/output data recorded.  Recent Labs    08/09/17 0215  HGB 14.2   Recent Labs    08/09/17 0215  WBC 11.7*  RBC 4.85  HCT 42.5  PLT 285   Recent Labs    08/09/17 0215  NA 134*  K 4.1  CL 101  CO2 26  BUN 14  CREATININE 0.76  GLUCOSE 349*  CALCIUM 8.7*   No results for input(s): LABPT, INR in the last 72 hours.  Neurologically intact Neurovascular intact Sensation intact distally Intact pulses distally Compartment soft  Assessment/Plan: Dr. Marry Guan has talked to Dr. Ginette Pitman traumatologist in [place] who is excepted to see this patient on Wednesday to discuss surgery. Patient is cleared to be discharged from the hospital.  Our office will contact her in regards to what time she needs to see Dr. Ginette Pitman in [place].  In the meantime she is to keep her leg elevated and ice.  Nonweightbearing She is not to take any anticoagulation medication Case management to assist with discharge planning Plan to discharge anytime from orthopedic standpoint.   Magdelene Ruark R. 08/10/2017, 11:00 AM

## 2017-08-10 NOTE — Progress Notes (Signed)
Inpatient Diabetes Program Recommendations  AACE/ADA: New Consensus Statement on Inpatient Glycemic Control (2015)  Target Ranges:  Prepandial:   less than 140 mg/dL      Peak postprandial:   less than 180 mg/dL (1-2 hours)      Critically ill patients:  140 - 180 mg/dL   Results for [name], [name] (MRN [id number]) as of 08/10/2017 14:12  Ref. Range 08/09/2017 07:55 08/09/2017 12:16 08/09/2017 13:24 08/09/2017 16:54 08/09/2017 21:43  Glucose-Capillary Latest Ref Range: 70 - 99 mg/dL 327 (H) 324 (H) 300 (H) 298 (H) 290 (H)   Results for [name], [name] (MRN [id number]) as of 08/10/2017 14:12  Ref. Range 08/10/2017 08:37 08/10/2017 12:24  Glucose-Capillary Latest Ref Range: 70 - 99 mg/dL 289 (H) 299 (H)   Review of Glycemic Control  Diabetes history: Type 2 DM  Outpatient Diabetes medications: Basaglar 30 units daily            Actos 45 mg daily            Tradjenta 5 mg daily            Bydureon 2 mg Qweek  Current orders for Inpatient glycemic control: Novolog Moderate Correction Scale/ SSI (0-15 units) TID AC + HS        Novolog 4 units TID with meals     Inpatient Diabetes Program Recommendations:   MD- Please consider starting Lantus 30 units QHS (please start tonight)    --Will follow patient during hospitalization--  Wyn Quaker RN, MSN, CDE Diabetes Coordinator Inpatient Glycemic Control Team Team Pager: [phone] (8a-5p)

## 2017-08-10 NOTE — Anesthesia Preprocedure Evaluation (Signed)
Anesthesia Evaluation  Patient identified by MRN, date of birth, ID band Patient awake    Reviewed: Allergy & Precautions, H&P , NPO status , Patient's Chart, lab work & pertinent test results, reviewed documented beta blocker date and time   History of Anesthesia Complications (+) history of anesthetic complications  Airway Mallampati: III   Neck ROM: full    Dental  (+) Poor Dentition, Teeth Intact   Pulmonary neg pulmonary ROS, sleep apnea and Continuous Positive Airway Pressure Ventilation , COPD,  COPD inhaler, Current Smoker,    Pulmonary exam normal        Cardiovascular Exercise Tolerance: Poor negative cardio ROS Normal cardiovascular exam Rhythm:regular Rate:Normal     Neuro/Psych  Headaches, Anxiety TIACVA negative neurological ROS  negative psych ROS   GI/Hepatic negative GI ROS, Neg liver ROS,   Endo/Other  negative endocrine ROSdiabetes  Renal/GU negative Renal ROS  negative genitourinary   Musculoskeletal   Abdominal   Peds  Hematology negative hematology ROS (+)   Anesthesia Other Findings Past Medical History: No date: Anxiety No date: Cancer [facility])     Comment:  a spot on liver and treated  No date: Complication of anesthesia     Comment:  restless,easily upset No date: COPD (chronic obstructive pulmonary disease) (HCC) No date: Diabetes mellitus without complication ([place]) No date: Headache(784.0) No date: Restless No date: Sleep apnea     Comment:  neg test No date: Stroke [facility]) Past Surgical History: No date: ABDOMINAL HYSTERECTOMY 03/11/2012: ANTERIOR CERVICAL DECOMP/DISCECTOMY FUSION; N/A     Comment:  Procedure: ANTERIOR CERVICAL DECOMPRESSION/DISCECTOMY               FUSION 1 LEVEL;  Surgeon: Ophelia Charter, MD;                Location: [facility];  Service: Neurosurgery;                Laterality: N/A;  Cervical five-six anterior cervical               decompression with  fusion interbody prothesis plating and              bonegraft No date: BACK SURGERY No date: EYE SURGERY No date: HERNIA REPAIR No date: JOINT REPLACEMENT     Comment:  bil knees No date: REPLACEMENT TOTAL KNEE BILATERAL BMI    Body Mass Index:  52.66 kg/m     Reproductive/Obstetrics negative OB ROS                             Anesthesia Physical Anesthesia Plan  ASA: IV and emergent  Anesthesia Plan: General   Post-op Pain Management:    Induction:   PONV Risk Score and Plan:   Airway Management Planned:   Additional Equipment:   Intra-op Plan:   Post-operative Plan:   Informed Consent: I have reviewed the patients History and Physical, chart, labs and discussed the procedure including the risks, benefits and alternatives for the proposed anesthesia with the patient or authorized representative who has indicated his/her understanding and acceptance.   Dental Advisory Given  Plan Discussed with: CRNA  Anesthesia Plan Comments:         Anesthesia Quick Evaluation

## 2017-08-10 NOTE — Transfer of Care (Signed)
Immediate Anesthesia Transfer of Care Note  Patient: [name]  Procedure(s) Performed: EXTERNAL FIXATION ANKLE (Right )  Patient Location: PACU  Anesthesia Type:General  Level of Consciousness: sedated and patient cooperative  Airway & Oxygen Therapy: Patient Spontanous Breathing and Patient connected to face mask oxygen  Post-op Assessment: Report given to RN and Post -op Vital signs reviewed and stable  Post vital signs: Reviewed and stable  Last Vitals:  Vitals Value Taken Time  BP 138/76 08/10/2017 10:35 PM  Temp 36.7 C 08/10/2017 10:35 PM  Pulse 98 08/10/2017 10:41 PM  Resp 19 08/10/2017 10:41 PM  SpO2 100 % 08/10/2017 10:41 PM  Vitals shown include unvalidated device data.  Last Pain:  Vitals:   08/10/17 1731  TempSrc: Oral  PainSc:       Patients Stated Pain Goal: 1 (48/62/82 [id number])  Complications: No apparent anesthesia complications

## 2017-08-11 DIAGNOSIS — W010XXA Fall on same level from slipping, tripping and stumbling without subsequent striking against object, initial encounter: Secondary | ICD-10-CM | POA: Diagnosis present

## 2017-08-11 DIAGNOSIS — M25571 Pain in right ankle and joints of right foot: Secondary | ICD-10-CM | POA: Diagnosis present

## 2017-08-11 DIAGNOSIS — Z794 Long term (current) use of insulin: Secondary | ICD-10-CM | POA: Diagnosis not present

## 2017-08-11 DIAGNOSIS — F17210 Nicotine dependence, cigarettes, uncomplicated: Secondary | ICD-10-CM | POA: Diagnosis present

## 2017-08-11 DIAGNOSIS — Z6841 Body Mass Index (BMI) 40.0 and over, adult: Secondary | ICD-10-CM | POA: Diagnosis not present

## 2017-08-11 DIAGNOSIS — S82851A Displaced trimalleolar fracture of right lower leg, initial encounter for closed fracture: Secondary | ICD-10-CM | POA: Diagnosis present

## 2017-08-11 DIAGNOSIS — I10 Essential (primary) hypertension: Secondary | ICD-10-CM | POA: Diagnosis present

## 2017-08-11 DIAGNOSIS — G43109 Migraine with aura, not intractable, without status migrainosus: Secondary | ICD-10-CM | POA: Diagnosis present

## 2017-08-11 DIAGNOSIS — Z79899 Other long term (current) drug therapy: Secondary | ICD-10-CM | POA: Diagnosis not present

## 2017-08-11 DIAGNOSIS — J449 Chronic obstructive pulmonary disease, unspecified: Secondary | ICD-10-CM | POA: Diagnosis present

## 2017-08-11 DIAGNOSIS — Z7902 Long term (current) use of antithrombotics/antiplatelets: Secondary | ICD-10-CM | POA: Diagnosis not present

## 2017-08-11 DIAGNOSIS — Z8673 Personal history of transient ischemic attack (TIA), and cerebral infarction without residual deficits: Secondary | ICD-10-CM | POA: Diagnosis not present

## 2017-08-11 DIAGNOSIS — F329 Major depressive disorder, single episode, unspecified: Secondary | ICD-10-CM | POA: Diagnosis present

## 2017-08-11 DIAGNOSIS — F419 Anxiety disorder, unspecified: Secondary | ICD-10-CM | POA: Diagnosis present

## 2017-08-11 DIAGNOSIS — Z96653 Presence of artificial knee joint, bilateral: Secondary | ICD-10-CM | POA: Diagnosis present

## 2017-08-11 DIAGNOSIS — Z886 Allergy status to analgesic agent status: Secondary | ICD-10-CM | POA: Diagnosis not present

## 2017-08-11 DIAGNOSIS — E785 Hyperlipidemia, unspecified: Secondary | ICD-10-CM | POA: Diagnosis present

## 2017-08-11 DIAGNOSIS — E1142 Type 2 diabetes mellitus with diabetic polyneuropathy: Secondary | ICD-10-CM | POA: Diagnosis present

## 2017-08-11 DIAGNOSIS — Z833 Family history of diabetes mellitus: Secondary | ICD-10-CM | POA: Diagnosis not present

## 2017-08-11 DIAGNOSIS — Z881 Allergy status to other antibiotic agents status: Secondary | ICD-10-CM | POA: Diagnosis not present

## 2017-08-11 DIAGNOSIS — Z79891 Long term (current) use of opiate analgesic: Secondary | ICD-10-CM | POA: Diagnosis not present

## 2017-08-11 LAB — GLUCOSE, CAPILLARY
Glucose-Capillary: 286 mg/dL — ABNORMAL HIGH (ref 70–99)
Glucose-Capillary: 254 mg/dL — ABNORMAL HIGH (ref 70–99)
Glucose-Capillary: 240 mg/dL — ABNORMAL HIGH (ref 70–99)

## 2017-08-11 MED ORDER — ONDANSETRON HCL 4 MG PO TABS: 4.0000 mg | ORAL_TABLET | Freq: Four times a day (QID) | ORAL | Status: DC | PRN

## 2017-08-11 MED ORDER — MAGNESIUM HYDROXIDE 400 MG/5ML PO SUSP
30.0000 mL | Freq: Every day | ORAL | Status: DC | PRN
Start: 2017-08-11 — End: 2017-08-11

## 2017-08-11 MED ORDER — PHENOL 1.4 % MT LIQD
1.0000 | OROMUCOSAL | Status: DC | PRN
  Administered 2017-08-11: 1 via OROMUCOSAL
  Filled 2017-08-11: qty 177

## 2017-08-11 MED ORDER — NYSTATIN 100000 UNIT/GM EX POWD
1.0000 g | Freq: Two times a day (BID) | CUTANEOUS | Status: DC
  Administered 2017-08-11 (×2): 1 g via TOPICAL
  Filled 2017-08-11: qty 15

## 2017-08-11 MED ORDER — HYDROCHLOROTHIAZIDE 25 MG PO TABS
25.0000 mg | ORAL_TABLET | Freq: Two times a day (BID) | ORAL | Status: DC
  Administered 2017-08-11: 25 mg via ORAL
  Filled 2017-08-11 (×2): qty 1

## 2017-08-11 MED ORDER — MUPIROCIN 2 % EX OINT: 1.0000 "application " | TOPICAL_OINTMENT | Freq: Two times a day (BID) | CUTANEOUS | 0 refills | Status: DC

## 2017-08-11 MED ORDER — ENOXAPARIN SODIUM 40 MG/0.4ML ~~LOC~~ SOLN: 40.0000 mg | Freq: Two times a day (BID) | SUBCUTANEOUS | Status: DC

## 2017-08-11 MED ORDER — PANTOPRAZOLE SODIUM 40 MG PO TBEC
40.0000 mg | DELAYED_RELEASE_TABLET | Freq: Every day | ORAL | Status: DC
  Administered 2017-08-11: 40 mg via ORAL
  Filled 2017-08-11: qty 1

## 2017-08-11 MED ORDER — SIMVASTATIN 20 MG PO TABS: 40.0000 mg | ORAL_TABLET | Freq: Every evening | ORAL | Status: DC

## 2017-08-11 MED ORDER — METOCLOPRAMIDE HCL 5 MG/ML IJ SOLN: 5.0000 mg | Freq: Three times a day (TID) | INTRAMUSCULAR | Status: DC | PRN

## 2017-08-11 MED ORDER — ACETAMINOPHEN 650 MG RE SUPP: 650.0000 mg | Freq: Four times a day (QID) | RECTAL | 0 refills | Status: DC | PRN

## 2017-08-11 MED ORDER — COLESEVELAM HCL 625 MG PO TABS
1875.0000 mg | ORAL_TABLET | Freq: Two times a day (BID) | ORAL | Status: DC
  Administered 2017-08-11 (×2): 1875 mg via ORAL
  Filled 2017-08-11 (×3): qty 3

## 2017-08-11 MED ORDER — TOPIRAMATE 100 MG PO TABS
100.0000 mg | ORAL_TABLET | Freq: Two times a day (BID) | ORAL | Status: DC
  Administered 2017-08-11 (×3): 100 mg via ORAL
  Filled 2017-08-11 (×3): qty 1

## 2017-08-11 MED ORDER — OXYCODONE HCL 10 MG PO TABS: 10.0000 mg | ORAL_TABLET | ORAL | 0 refills | Status: DC | PRN

## 2017-08-11 MED ORDER — DEXTROSE 5 % IV SOLN
3.0000 g | Freq: Four times a day (QID) | INTRAVENOUS | Status: AC
  Administered 2017-08-11 (×3): 3 g via INTRAVENOUS
  Filled 2017-08-11 (×3): qty 3

## 2017-08-11 MED ORDER — ALBUTEROL SULFATE (2.5 MG/3ML) 0.083% IN NEBU: 2.5000 mg | INHALATION_SOLUTION | Freq: Four times a day (QID) | RESPIRATORY_TRACT | Status: DC | PRN

## 2017-08-11 MED ORDER — FLEET ENEMA 7-19 GM/118ML RE ENEM: 1.0000 | ENEMA | Freq: Once | RECTAL | Status: DC | PRN

## 2017-08-11 MED ORDER — FLEET ENEMA 7-19 GM/118ML RE ENEM
1.0000 | ENEMA | Freq: Once | RECTAL | Status: AC
  Administered 2017-08-11: 1 via RECTAL

## 2017-08-11 MED ORDER — HYDROMORPHONE HCL 1 MG/ML IJ SOLN
1.0000 mg | INTRAMUSCULAR | Status: DC | PRN
Start: 2017-08-11 — End: 2017-08-11
  Administered 2017-08-11 (×2): 1 mg via INTRAVENOUS
  Filled 2017-08-11 (×2): qty 1

## 2017-08-11 MED ORDER — FUROSEMIDE 40 MG PO TABS
40.0000 mg | ORAL_TABLET | Freq: Every day | ORAL | Status: DC
  Administered 2017-08-11: 40 mg via ORAL
  Filled 2017-08-11: qty 1

## 2017-08-11 MED ORDER — MENTHOL 3 MG MT LOZG
1.0000 | LOZENGE | OROMUCOSAL | Status: DC | PRN
  Filled 2017-08-11: qty 9

## 2017-08-11 MED ORDER — OXYCODONE HCL 5 MG PO TABS
5.0000 mg | ORAL_TABLET | ORAL | Status: DC | PRN
Start: 2017-08-11 — End: 2017-08-11

## 2017-08-11 MED ORDER — ACETAMINOPHEN 325 MG PO TABS
ORAL_TABLET | ORAL | Status: AC
  Administered 2017-08-11: 325 mg
  Filled 2017-08-11: qty 1

## 2017-08-11 MED ORDER — PIOGLITAZONE HCL 30 MG PO TABS
45.0000 mg | ORAL_TABLET | Freq: Every day | ORAL | Status: DC
  Administered 2017-08-11: 45 mg via ORAL
  Filled 2017-08-11: qty 1

## 2017-08-11 MED ORDER — ACETAMINOPHEN 325 MG PO TABS
650.0000 mg | ORAL_TABLET | Freq: Four times a day (QID) | ORAL | Status: DC
  Administered 2017-08-11 (×3): 650 mg via ORAL
  Filled 2017-08-11 (×3): qty 2

## 2017-08-11 MED ORDER — METOCLOPRAMIDE HCL 10 MG PO TABS: 5.0000 mg | ORAL_TABLET | Freq: Three times a day (TID) | ORAL | Status: DC | PRN

## 2017-08-11 MED ORDER — VITAMIN D (ERGOCALCIFEROL) 1.25 MG (50000 UNIT) PO CAPS: 50000.0000 [IU] | ORAL_CAPSULE | ORAL | Status: DC

## 2017-08-11 MED ORDER — SODIUM CHLORIDE 0.9 % IV SOLN
INTRAVENOUS | Status: DC
  Administered 2017-08-11: 01:00:00 via INTRAVENOUS

## 2017-08-11 MED ORDER — MORPHINE SULFATE (PF) 2 MG/ML IV SOLN
2.0000 mg | INTRAVENOUS | Status: DC | PRN
  Administered 2017-08-11: 4 mg via INTRAVENOUS
  Administered 2017-08-11 (×2): 2 mg via INTRAVENOUS
  Filled 2017-08-11: qty 2
  Filled 2017-08-11: qty 1
  Filled 2017-08-11: qty 2

## 2017-08-11 MED ORDER — GABAPENTIN 600 MG PO TABS
600.0000 mg | ORAL_TABLET | Freq: Four times a day (QID) | ORAL | Status: DC
  Administered 2017-08-11 (×2): 600 mg via ORAL
  Filled 2017-08-11 (×4): qty 1

## 2017-08-11 MED ORDER — ONDANSETRON HCL 4 MG/2ML IJ SOLN
4.0000 mg | Freq: Four times a day (QID) | INTRAMUSCULAR | Status: DC | PRN
  Administered 2017-08-11: 4 mg via INTRAVENOUS
  Filled 2017-08-11: qty 2

## 2017-08-11 MED ORDER — DICYCLOMINE HCL 20 MG PO TABS
20.0000 mg | ORAL_TABLET | Freq: Two times a day (BID) | ORAL | Status: DC
  Administered 2017-08-11 (×3): 20 mg via ORAL
  Filled 2017-08-11 (×3): qty 1

## 2017-08-11 MED ORDER — BISACODYL 10 MG RE SUPP: 10.0000 mg | Freq: Every day | RECTAL | Status: DC | PRN

## 2017-08-11 NOTE — Discharge Instructions (Signed)
Follow Up with primary care physician in 3 days Dr. Marry Guan in 2 weeks  ortho are communicating with Dr. Linton Ham office and agrees for the traumatologist for appointment on on Wednesday. Continue to elevate right lower extremity and apply ice at all times.  Is to be nonweightbearing to the left leg.

## 2017-08-11 NOTE — Discharge Summary (Signed)
[facility] [place] NAME: [name]    MR#:  [id number]  DATE OF BIRTH:  10/21/1965  DATE OF ADMISSION:  08/08/2017 ADMITTING PHYSICIAN: Harrie Foreman, MD  DATE OF DISCHARGE:  08/11/17  PRIMARY CARE PHYSICIAN: Remi Haggard, FNP    ADMISSION DIAGNOSIS:  Closed trimalleolar fracture of right ankle, initial encounter [S82.851A]  DISCHARGE DIAGNOSIS:  Active Problems:   Trimalleolar fracture   SECONDARY DIAGNOSIS:   Past Medical History:  Diagnosis Date  . Anxiety   . Cancer ([place])    a spot on liver and treated   . Complication of anesthesia    restless,easily upset  . COPD (chronic obstructive pulmonary disease) ([place])   . Diabetes mellitus without complication ([place])   . Headache(784.0)   . Restless   . Sleep apnea    neg test  . Stroke [facility])     HOSPITAL COURSE:   HPI: The patient with past medical history of stroke, COPD and diabetes presents to the emergency department after suffering a fall.  The patient states that she was getting into her vehicle when somehow she lost her footing and fell out of the car.  The patient denies loss of consciousness.  She immediately felt pain in her right ankle which she felt buckle underneath her weight.  X-ray of the family in the emergency department demonstrated severe fracture which prompted the emergency department staff to call the hospitalist service for admission.  # . Right ankle fracture: Trimalleolar; status post closed reduction in the emergency department Post op day #1 Patient's Plavix and Lovenox subcu are on hold due to the surgery.  Will resume Plavix OxyContin 30 mg p.o. every 12 hours is added to the regimen which is at home medication DVT Prophylaxis - Foot Pumps and TED hose to right leg Weight-Bearing as tolerated to right leg Patient had a bowel movement Okay to discharge to home from Ortho standpoint.  They are communicating with Dr. Linton Ham  office and agrees for the traumatologist for appointment on on Wednesday. Continue to elevate right lower extremity and apply ice at all times.  Is to be nonweightbearing to the left leg.  CT scan has revealed 1. Complex comminuted intra-articular fractures of the distal tibia as described above. 2. Nondisplaced transverse fracture through the distal fibular shaft. 3. Small avulsion fracture involving the lateral talus.  # Diabetes mellitus type 2: resume  oral hypoglycemic agents. Sliding scale insulin while hospitalized.    # HTN blood pressure is soft hold hydrochlorothiazide  #. Cerebrovascular disease: History of stroke; resume Plavix She is always had residual weakness on her right side since her stroke.  4. Depression: Stable; continue Celexa and Ativan as needed  5. DVT prophylaxis:  Foot Pumps and TED hose to right leg    6. GI prophylaxis: None     DISCHARGE CONDITIONS:   fair  CONSULTS OBTAINED:  Treatment Team:  Creig Hines, MD Dereck Leep, MD   PROCEDURES EXTERNAL FIXATION ANKLE (Right    DRUG ALLERGIES:   Allergies  Allergen Reactions  . Tramadol Nausea And Vomiting and Other (See Comments)    Increase blood pressure  . Augmentin [Amoxicillin-Pot Clavulanate] Rash    DISCHARGE MEDICATIONS:   Allergies as of 08/11/2017      Reactions   Tramadol Nausea And Vomiting, Other (See Comments)   Increase blood pressure   Augmentin [amoxicillin-pot Clavulanate] Rash      Medication List  STOP taking these medications   hydrochlorothiazide 25 MG tablet Commonly known as:  HYDRODIURIL     TAKE these medications   acetaminophen 650 MG suppository Commonly known as:  TYLENOL Place 1 suppository (650 mg total) rectally every 6 (six) hours as needed for mild pain (or Fever >/= 101).   AIMOVIG 70 MG/ML Soaj Generic drug:  Erenumab-aooe Inject 70 mg into the skin every 30 (thirty) days.   ALPRAZolam 1 MG tablet Commonly  known as:  XANAX Take 1 mg by mouth 3 (three) times daily as needed for anxiety.   BASAGLAR KWIKPEN 100 UNIT/ML Sopn Inject 30 Units into the skin daily.   BYDUREON BCISE 2 MG/0.85ML Auij Generic drug:  Exenatide ER Inject 2 mg into the skin once a week. On Thursday   citalopram 40 MG tablet Commonly known as:  CELEXA Take 40 mg by mouth daily.   clopidogrel 75 MG tablet Commonly known as:  PLAVIX Take 75 mg by mouth daily.   dicyclomine 20 MG tablet Commonly known as:  BENTYL Take 20 mg by mouth 2 (two) times daily.   furosemide 40 MG tablet Commonly known as:  LASIX Take 40 mg by mouth daily.   gabapentin 600 MG tablet Commonly known as:  NEURONTIN Take 1 tablet (600 mg total) by mouth 4 (four) times daily.   linagliptin 5 MG Tabs tablet Commonly known as:  TRADJENTA Take 5 mg by mouth daily.   mupirocin ointment 2 % Commonly known as:  BACTROBAN Place 1 application into the nose 2 (two) times daily.   nystatin powder Commonly known as:  MYCOSTATIN/NYSTOP Apply 1 g topically 2 (two) times daily.   OXYCONTIN 30 MG 12 hr tablet Generic drug:  oxyCODONE Take 30 mg by mouth 2 (two) times daily. What changed:  Another medication with the same name was changed. Make sure you understand how and when to take each.   Oxycodone HCl 10 MG Tabs Take 1 tablet (10 mg total) by mouth every 4 (four) hours as needed (pain). What changed:  when to take this   pantoprazole 40 MG tablet Commonly known as:  PROTONIX Take 40 mg by mouth daily.   pioglitazone 45 MG tablet Commonly known as:  ACTOS Take 45 mg by mouth daily.   rizatriptan 10 MG disintegrating tablet Commonly known as:  MAXALT-MLT Take 10 mg by mouth as needed for migraine. May repeat in 2 hours if needed   simvastatin 40 MG tablet Commonly known as:  ZOCOR Take 1 tablet (40 mg total) by mouth every evening.   topiramate 100 MG tablet Commonly known as:  TOPAMAX Take 1 tablet (100 mg total) by mouth 2  (two) times daily.   VENTOLIN HFA 108 (90 Base) MCG/ACT inhaler Generic drug:  albuterol Inhale 1-2 puffs into the lungs every 6 (six) hours as needed for wheezing or shortness of breath.   Vitamin D (Ergocalciferol) 50000 units Caps capsule Commonly known as:  DRISDOL Take 50,000 Units by mouth every 7 (seven) days. On Wednesday   WELCHOL 625 MG tablet Generic drug:  colesevelam Take 1,875 mg by mouth 2 (two) times daily.        DISCHARGE INSTRUCTIONS:   Follow Up with primary care physician in 3 days Dr. Marry Guan in 2 weeks  ortho are communicating with Dr. Linton Ham office and agrees for the traumatologist for appointment on on Wednesday. Continue to elevate right lower extremity and apply ice at all times.  Is to be nonweightbearing to the  left leg.    DIET:  Cardiac diet and Diabetic diet  DISCHARGE CONDITION:  Fair  ACTIVITY:  Activity as tolerated per ortho recs  OXYGEN:  Home Oxygen: No.   Oxygen Delivery: room air  DISCHARGE LOCATION:  home   If you experience worsening of your admission symptoms, develop shortness of breath, life threatening emergency, suicidal or homicidal thoughts you must seek medical attention immediately by calling 911 or calling your MD immediately  if symptoms less severe.  You Must read complete instructions/literature along with all the possible adverse reactions/side effects for all the Medicines you take and that have been prescribed to you. Take any new Medicines after you have completely understood and accpet all the possible adverse reactions/side effects.   Please note  You were cared for by a hospitalist during your hospital stay. If you have any questions about your discharge medications or the care you received while you were in the hospital after you are discharged, you can call the unit and asked to speak with the hospitalist on call if the hospitalist that took care of you is not available. Once you are discharged, your  primary care physician will handle any further medical issues. Please note that NO REFILLS for any discharge medications will be authorized once you are discharged, as it is imperative that you return to your primary care physician (or establish a relationship with a primary care physician if you do not have one) for your aftercare needs so that they can reassess your need for medications and monitor your lab values.     Today  Chief Complaint  Patient presents with  . Ankle Injury    Patient's pain is manageable okay to discharge patient from orthopedic standpoint.  Patient refused to go to rehab center ,prefers going home with home health ROS:  CONSTITUTIONAL: Denies fevers, chills. Denies any fatigue, weakness.  EYES: Denies blurry vision, double vision, eye pain. EARS, NOSE, THROAT: Denies tinnitus, ear pain, hearing loss. RESPIRATORY: Denies cough, wheeze, shortness of breath.  CARDIOVASCULAR: Denies chest pain, palpitations, edema.  GASTROINTESTINAL: Denies nausea, vomiting, diarrhea, abdominal pain. Denies bright red blood per rectum. GENITOURINARY: Denies dysuria, hematuria. ENDOCRINE: Denies nocturia or thyroid problems. HEMATOLOGIC AND LYMPHATIC: Denies easy bruising or bleeding. SKIN: Denies rash or lesion. MUSCULOSKELETAL: Right ankle pain  nEUROLOGIC: Denies paralysis, paresthesias.  PSYCHIATRIC: Denies anxiety or depressive symptoms.   VITAL SIGNS:  Blood pressure 109/67, pulse 84, temperature 97.7 F (36.5 C), temperature source Oral, resp. rate 20, height 5\' 6"  (1.676 m), weight (!) 145.8 kg (321 lb 6.9 oz), SpO2 98 %.  I/O:    Intake/Output Summary (Last 24 hours) at 08/11/2017 1524 Last data filed at 08/11/2017 1500 Gross per 24 hour  Intake 2118.33 ml  Output 1505 ml  Net 613.33 ml    PHYSICAL EXAMINATION:  GENERAL:  52 y.o.-year-old patient lying in the bed with no acute distress.  EYES: Pupils equal, round, reactive to light and accommodation. No  scleral icterus. Extraocular muscles intact.  HEENT: Head atraumatic, normocephalic. Oropharynx and nasopharynx clear.  NECK:  Supple, no jugular venous distention. No thyroid enlargement, no tenderness.  LUNGS: Normal breath sounds bilaterally, no wheezing, rales,rhonchi or crepitation. No use of accessory muscles of respiration.  CARDIOVASCULAR: S1, S2 normal. No murmurs, rubs, or gallops.  ABDOMEN: Soft, non-tender, non-distended. Bowel sounds present. No organomegaly or mass.  EXTREMITIES: Right lower extremity status post surgery no pedal edema, cyanosis, or clubbing.  NEUROLOGIC: Cranial nerves II through XII are intact.  Sensation intact. Gait not checked.  PSYCHIATRIC: The patient is alert and oriented x 3.  SKIN: No obvious rash, lesion, or ulcer.   DATA REVIEW:   CBC Recent Labs  Lab 08/09/17 0215  WBC 11.7*  HGB 14.2  HCT 42.5  PLT 285    Chemistries  Recent Labs  Lab 08/09/17 0215  NA 134*  K 4.1  CL 101  CO2 26  GLUCOSE 349*  BUN 14  CREATININE 0.76  CALCIUM 8.7*    Cardiac Enzymes No results for input(s): TROPONINI in the last 168 hours.  Microbiology Results  Results for orders placed or performed during the hospital encounter of 08/08/17  Surgical PCR screen     Status: Abnormal   Collection Time: 08/10/17 12:33 AM  Result Value Ref Range Status   MRSA, PCR NEGATIVE NEGATIVE Final   Staphylococcus aureus POSITIVE (A) NEGATIVE Final    Comment: (NOTE) The Xpert SA Assay (FDA approved for NASAL specimens in patients 32 years of age and older), is one component of a comprehensive surveillance program. It is not intended to diagnose infection nor to guide or monitor treatment. Performed at [facility], [address] [place], [place] [address]     RADIOLOGY:  Dg Ankle 2 Views Right  Result Date: 08/10/2017 CLINICAL DATA:  External fixator of right ankle.  Ankle fracture. EXAM: RIGHT ANKLE - 2 VIEW; DG C-ARM 61-120 MIN COMPARISON:   Preoperative imaging. FINDINGS: Two fluoroscopic spot views obtained in the operating room demonstrate external fixator through the foot, visualized only in AP. A single lateral view of the upper tibia and fibula without fixator pin definitively seen on included image. Distal tibia and fibular fractures faintly visualized. Total fluoroscopy time 21 seconds. IMPRESSION: Two fluoroscopic spot views during external fixator placement for distal tibia and fibular fractures. Electronically Signed   By: Jeb Levering M.D.   On: 08/10/2017 23:39   Dg Ankle 2 Views Right  Result Date: 08/09/2017 CLINICAL DATA:  Postreduction EXAM: RIGHT ANKLE - 2 VIEW COMPARISON:  Right ankle radiographs from 1 day prior FINDINGS: Overlying cast obscures fine bone detail. Nondisplaced lateral malleolus fracture. No significant displacement at the posterior or medial malleolar fractures. Slight widening at the medial ankle mortise without significant subluxation. No suspicious focal osseous lesions. No radiopaque foreign body. IMPRESSION: Nondisplaced trimalleolar right ankle fractures. Slight widening at the medial ankle mortise without significant subluxation. Electronically Signed   By: Ilona Sorrel M.D.   On: 08/09/2017 09:32   Dg Ankle Complete Right  Result Date: 08/09/2017 CLINICAL DATA:  Fall today with right ankle pain and deformity. EXAM: RIGHT ANKLE - COMPLETE 3+ VIEW COMPARISON:  None. FINDINGS: Comminuted and displaced fracture of the distal tibia involves the posterior and medial malleolus and extends into the tibial talar joint. Mildly displaced distal fibular fracture just proximal to the ankle mortise. There is diffuse soft tissue edema about the ankle. IMPRESSION: Comminuted and displaced trimalleolar fracture. Electronically Signed   By: Jeb Levering M.D.   On: 08/09/2017 00:59   Ct Ankle Right Wo Contrast  Result Date: 08/09/2017 CLINICAL DATA:  Evaluate right ankle fractures. EXAM: CT OF THE RIGHT ANKLE  WITHOUT CONTRAST TECHNIQUE: Multidetector CT imaging of the right ankle was performed according to the standard protocol. Multiplanar CT image reconstructions were also generated. COMPARISON:  Radiographs 08/09/2017 FINDINGS: Advanced osteoporosis for the patient's age. Comminuted intra-articular fractures of the tibia. There are longitudinal and oblique components extending down to the articular surface. Maximum separation  of the articular fragments is 6.5 mm. Slight depression of the posterior malleolar fracture up to 2.5 mm. Oblique fracture through the base of the medial malleolus without significant displacement. There is also a small avulsion type fracture involving the lateral aspect of the tibial epiphysis. Nondisplaced fracture involving the distal fibular shaft 3 cm above the ankle mortise. No fracture of the talar dome or neck. There is a small avulsion fracture along the lateral aspect of the talus. The ankle mortise is fairly well maintained. The subtalar joints are maintained. Os trigonum is noted. The sinus tarsi appears normal. IMPRESSION: 1. Complex comminuted intra-articular fractures of the distal tibia as described above. 2. Nondisplaced transverse fracture through the distal fibular shaft. 3. Small avulsion fracture involving the lateral talus. 4. Advanced osteoporosis for age. Electronically Signed   By: Marijo Sanes M.D.   On: 08/09/2017 11:01   Dg C-arm 1-60 Min  Result Date: 08/10/2017 CLINICAL DATA:  External fixator of right ankle.  Ankle fracture. EXAM: RIGHT ANKLE - 2 VIEW; DG C-ARM 61-120 MIN COMPARISON:  Preoperative imaging. FINDINGS: Two fluoroscopic spot views obtained in the operating room demonstrate external fixator through the foot, visualized only in AP. A single lateral view of the upper tibia and fibula without fixator pin definitively seen on included image. Distal tibia and fibular fractures faintly visualized. Total fluoroscopy time 21 seconds. IMPRESSION: Two  fluoroscopic spot views during external fixator placement for distal tibia and fibular fractures. Electronically Signed   By: Jeb Levering M.D.   On: 08/10/2017 23:39    EKG:   Orders placed or performed during the hospital encounter of 08/08/17  . ED EKG  . ED EKG  . EKG 12-Lead  . EKG 12-Lead      Management plans discussed with the patient, family and they are in agreement.  CODE STATUS:     Code Status Orders  (From admission, onward)        Start     Ordered   08/09/17 0534  Full code  Continuous     08/09/17 0533    Code Status History    Date Active Date Inactive Code Status Order ID Comments User Context   04/07/2017 0750 04/08/2017 2227 Full Code [id number]  Elease Hashimoto ED   10/11/2012 2109 10/13/2012 2123 Full Code [id number]  Orvan Falconer, MD Inpatient   03/11/2012 1846 03/12/2012 1442 Full Code [id number]  Ophelia Charter, MD Inpatient      TOTAL TIME TAKING CARE OF THIS PATIENT: 45 minutes.   Note: This dictation was prepared with Dragon dictation along with smaller phrase technology. Any transcriptional errors that result from this process are unintentional.   @[facility] @  on 08/11/2017 at 3:24 PM  Between 7am to 6pm - Pager - [phone]  After 6pm go to [email or web address] - password [email or web address] [facility]  Office  [phone]  CC: Primary care physician; Remi Haggard, FNP

## 2017-08-11 NOTE — Progress Notes (Signed)
   Subjective: 1 Day Post-Op Procedure(s) (LRB): EXTERNAL FIXATION ANKLE (Right) day 3 of admission Patient reports pain as moderate.   Patient is well, and has had no acute complaints or problems We will start therapy today.  Plan is to go Home after hospital stay. no nausea and no vomiting Patient denies any chest pains or shortness of breath. Objective: Vital signs in last 24 hours: Temp:  [98.1 F (36.7 C)-99.1 F (37.3 C)] 98.9 F (37.2 C) (07/30 0352) Pulse Rate:  [88-102] 88 (07/30 0352) Resp:  [13-25] 18 (07/30 0352) BP: (105-138)/(56-88) 110/56 (07/30 0352) SpO2:  [90 %-100 %] 96 % (07/30 0352) Weight:  [145.8 kg (321 lb 6.9 oz)] 145.8 kg (321 lb 6.9 oz) (07/30 0418) Heels are non tender and elevated off the bed using rolled towels Intake/Output from previous day: 07/29 0701 - 07/30 0700 In: 628.3 [P.O.:30; I.V.:548.3; IV Piggyback:50] Out: 785 [Urine:650; Blood:5] Intake/Output this shift: No intake/output data recorded.  Recent Labs    08/09/17 0215  HGB 14.2   Recent Labs    08/09/17 0215  WBC 11.7*  RBC 4.85  HCT 42.5  PLT 285   Recent Labs    08/09/17 0215  NA 134*  K 4.1  CL 101  CO2 26  BUN 14  CREATININE 0.76  GLUCOSE 349*  CALCIUM 8.7*   No results for input(s): LABPT, INR in the last 72 hours.  EXAM General - Patient is Alert, Appropriate and Oriented Extremity - Neurologically intact Neurovascular intact Sensation intact distally Intact pulses distally Compartment soft Dressing - dressing C/D/I Motor Function - intact, moving foot and toes well on exam.    Past Medical History:  Diagnosis Date  . Anxiety   . Cancer ([place])    a spot on liver and treated   . Complication of anesthesia    restless,easily upset  . COPD (chronic obstructive pulmonary disease) ([place])   . Diabetes mellitus without complication ([place])   . Headache(784.0)   . Restless   . Sleep apnea    neg test  . Stroke ([facility])     Assessment/Plan: 1 Day  Post-Op Procedure(s) (LRB): EXTERNAL FIXATION ANKLE (Right) Active Problems:   Trimalleolar fracture  Estimated body mass index is 51.88 kg/m as calculated from the following:   Height as of this encounter: 5\' 6"  (1.676 m).   Weight as of this encounter: 145.8 kg (321 lb 6.9 oz). Advance diet Up with therapy D/C IV fluids  Labs: None DVT Prophylaxis - Foot Pumps and TED hose to right leg Weight-Bearing as tolerated to right leg Patient needs a bowel movement Okay to discharge to home.  We are communicating with Dr. Linton Ham office and agrees for the traumatologist for appointment on on Wednesday. Continue to elevate right lower extremity and apply ice at all times.  Is to be nonweightbearing to the left leg.  [name]. [place] 08/11/2017, 7:22 AM

## 2017-08-11 NOTE — Progress Notes (Signed)
Anticoagulation monitoring(Lovenox):  52yo  female ordered Lovenox 40 mg Q24h  Filed Weights   08/09/17 0331 08/10/17 0500 08/11/17 0418  Weight: (!) 325 lb 6.4 oz (147.6 kg) (!) 326 lb 4.5 oz (148 kg) (!) 321 lb 6.9 oz (145.8 kg)   BMI 51.7   Lab Results  Component Value Date   CREATININE 0.76 08/09/2017   CREATININE 0.83 04/08/2017   CREATININE 0.90 04/07/2017   Estimated Creatinine Clearance: 119.2 mL/min (by C-G formula based on SCr of 0.76 mg/dL). Hemoglobin & Hematocrit     Component Value Date/Time   HGB 14.2 08/09/2017 0215   HGB 14.6 09/22/2013 2251   HCT 42.5 08/09/2017 0215   HCT 44.3 09/22/2013 2251     Per Protocol for Patient with estCrcl > 30 ml/min and BMI > 40, will transition to Lovenox 40 mg BID.

## 2017-08-11 NOTE — Evaluation (Signed)
Physical Therapy Evaluation Patient Details Name: [name] MRN: [id number] DOB: 22-Sep-1965 Today's Date: 08/11/2017   History of Present Illness  Patient is 52 yo female s/p  closed reduction and external fixation of the right trimalleolar/pilon R ankle fracture NWB post fall 08/08/17. PMH of stroke, COPD, DM, anxiety, smoker  Clinical Impression  Patient A&Ox4 at start of session, reports 7/10 pain of RLE. Patient reports prior to admission that she lives with her husband and a roommate in a 1 story home with level entry. States that depending the day, for ambulation/mobility she will use her SPC, RW, or WC, and just recently got a hoverround. States that she needs assistance with bathing and dressing, husband performs IADLs. The patient demonstrated bed mobility with minAx1 for RLE. The patient was able to sit EOB with b/l UE support and supervision for several minutes during session. Sit <> stand with maxAx1 and nursing staff with stabilization of walker attempted. Patient unable to adhere to NWB precautions. Stand pivot transfer to chair with RW, maxAx2 and standby assist performed, patient able to make it to chair. Patient had significant pain with task, and fatigued very quickly and lowered into chair by PT staff.  The patient would benefit from further skilled PT to address deficits in mobility, balance, strength, endurance and for further education about condition, precautions, and safety. Current recommendation at this time is STR due to these limitations and assistance needed for mobility. Patient's husband may only be able to provide minimal physical assistance.     Follow Up Recommendations SNF    Equipment Recommendations  Other (comment)(sliding board)    Recommendations for Other Services       Precautions / Restrictions Precautions Precautions: Fall Restrictions Weight Bearing Restrictions: Yes RLE Weight Bearing: Non weight bearing Other Position/Activity Restrictions:  external fixator RLE      Mobility  Bed Mobility Overal bed mobility: Needs Assistance Bed Mobility: Supine to Sit     Supine to sit: Min assist     General bed mobility comments: min Ax1 for LE management  Transfers Overall transfer level: Needs assistance Equipment used: Rolling walker (2 wheeled) Transfers: Stand Pivot Transfers;Sit to/from Stand Sit to Stand: Max assist Stand pivot transfers: +2 physical assistance;Max assist          Ambulation/Gait             General Gait Details: unable/unsafe  Stairs            Wheelchair Mobility    Modified Rankin (Stroke Patients Only)       Balance Overall balance assessment: Needs assistance Sitting-balance support: Bilateral upper extremity supported Sitting balance-Leahy Scale: Fair       Standing balance-Leahy Scale: Poor                               Pertinent Vitals/Pain Pain Assessment: 0-10 Pain Score: 7  Pain Location: RLE Pain Intervention(s): Monitored during session;Limited activity within patient's tolerance;Repositioned    Home Living Family/patient expects to be discharged to:: Private residence Living Arrangements: Spouse/significant other;Non-relatives/Friends Available Help at Discharge: Family;Available 24 hours/day Type of Home: Mobile home Home Access: Level entry     Home Layout: One level Home Equipment: Bedside commode;Wheelchair - Comptroller - 2 wheels;Cane - single point;Tub bench(hoverround)      Prior Function Level of Independence: Needs assistance   Gait / Transfers Assistance Needed: Patient reports using cane, RW, and WC at  home for mobility. States it depends on the day which one she uses.  ADL's / Homemaking Assistance Needed: Patient reports minAx1 for ADLs, spouse performs all IADLS        Hand Dominance   Dominant Hand: Right    Extremity/Trunk Assessment   Upper Extremity Assessment Upper Extremity  Assessment: Overall WFL for tasks assessed    Lower Extremity Assessment Lower Extremity Assessment: Generalized weakness;RLE deficits/detail;LLE deficits/detail RLE Deficits / Details: hip flexion: 2+, knee extension: 2+ RLE: Unable to fully assess due to immobilization LLE Deficits / Details: at least 3/5       Communication   Communication: No difficulties  Cognition Arousal/Alertness: Awake/alert Behavior During Therapy: WFL for tasks assessed/performed Overall Cognitive Status: Within Functional Limits for tasks assessed                                        General Comments      Exercises     Assessment/Plan    PT Assessment Patient needs continued PT services  PT Problem List Decreased knowledge of use of DME;Decreased range of motion;Decreased strength;Obesity;Decreased activity tolerance;Decreased balance;Decreased knowledge of precautions;Decreased mobility       PT Treatment Interventions DME instruction;Balance training;Neuromuscular re-education;Gait training;Stair training;Functional mobility training;Patient/family education;Therapeutic activities;Therapeutic exercise    PT Goals (Current goals can be found in the Care Plan section)  Acute Rehab PT Goals Patient Stated Goal: Patient wants to go home PT Goal Formulation: With patient Time For Goal Achievement: 08/25/17 Potential to Achieve Goals: Fair    Frequency BID   Barriers to discharge        Co-evaluation               AM-PAC PT "6 Clicks" Daily Activity  Outcome Measure Difficulty turning over in bed (including adjusting bedclothes, sheets and blankets)?: A Little Difficulty moving from lying on back to sitting on the side of the bed? : Unable Difficulty sitting down on and standing up from a chair with arms (e.g., wheelchair, bedside commode, etc,.)?: Unable Help needed moving to and from a bed to chair (including a wheelchair)?: A Lot Help needed walking in hospital  room?: Total Help needed climbing 3-5 steps with a railing? : Total 6 Click Score: 9    End of Session Equipment Utilized During Treatment: Gait belt Activity Tolerance: Patient limited by fatigue Patient left: in chair;with chair alarm set;with call bell/phone within reach Nurse Communication: Mobility status PT Visit Diagnosis: Unsteadiness on feet (R26.81);Difficulty in walking, not elsewhere classified (R26.2);Other abnormalities of gait and mobility (R26.89);Muscle weakness (generalized) (M62.81)    Time: 0076-2263 PT Time Calculation (min) (ACUTE ONLY): 56 min   Charges:   PT Evaluation $PT Eval Moderate Complexity: 1 Mod PT Treatments $Therapeutic Activity: 38-52 mins      Lieutenant Diego PT, DPT 12:50 PM,08/11/17 [phone]

## 2017-08-11 NOTE — Discharge Planning (Signed)
Patient IV x3 removed.  RN assessment and VS revealed stability for DC to home.  Discharge papers given explained and educated.  Informed of suggested FU appts and appts made.  Paper pain script signed and given.  EMS contacted to transport, Waiting on arrival.

## 2017-08-11 NOTE — Clinical Social Work Note (Signed)
Clinical Social Work Assessment  Patient Details  Name: [name] MRN: [id number] Date of Birth: [birth year]  Date of referral:  08/11/17               Reason for consult:  Facility Placement                Permission sought to share information with:    Permission granted to share information::     Name::        Agency::     Relationship::     Contact Information:     Housing/Transportation Living arrangements for the past 2 months:  Single Family Home Source of Information:  Patient Patient Interpreter Needed:  None Criminal Activity/Legal Involvement Pertinent to Current Situation/Hospitalization:  No - Comment as needed Significant Relationships:  Spouse Lives with:  Spouse Do you feel safe going back to the place where you live?  Yes Need for family participation in patient care:  Yes (Comment)  Care giving concerns:  Patient lives in [place] with her husband.    Social Worker assessment / plan:  Holiday representative (CSW) received verbal consult from PT that recommendation is for SNF. CSW met with patient alone at bedside to discuss D/C plan. Patient was alert and oriented X4 and was sitting up in the bed. CSW introduced self and explained role of CSW department. Per patient she lives in [place] with her husband and they have a roommate. Per patient her 2 friends live next door to her. Patient reported that she has a strong support system. CSW explained that PT is recommending SNF. Per patient she does not want to go to SNF. Per patient she wants to go home and is agreeable to home health. Per patient she was suppose to start with [facility] home [facility] today and would like to continue with them. Patient reported that she would consider going home via EMS today and will speak with her husband about it. RN case Freight forwarder, RN and MD aware of above. CSW will continue to follow and assist as needed.   Employment status:  Disabled (Comment on whether or not currently receiving  Disability) Insurance information:  Medicare, Medicaid In [place] PT Recommendations:  [place] / Referral to community resources:  Other (Comment Required)(Patient is refusing SNF. )  Patient/Family's Response to care:  Patient refused SNF and wants to go home.   Patient/Family's Understanding of and Emotional Response to Diagnosis, Current Treatment, and Prognosis:  Patient was very pleasant and thanked CSW for assistance.   Emotional Assessment Appearance:  Appears stated age Attitude/Demeanor/Rapport:    Affect (typically observed):  Accepting, Adaptable Orientation:  Oriented to Self, Oriented to Place, Oriented to  Time, Oriented to Situation Alcohol / Substance use:  Not Applicable Psych involvement (Current and /or in the community):  No (Comment)  Discharge Needs  Concerns to be addressed:  Discharge Planning Concerns Readmission within the last 30 days:  No Current discharge risk:  Dependent with Mobility Barriers to Discharge:  No Barriers Identified   Autumn Pruitt, Veronia Beets, LCSW 08/11/2017, 2:49 PM

## 2017-08-12 ENCOUNTER — Encounter: Payer: Self-pay | Admitting: Orthopedic Surgery

## 2017-08-12 ENCOUNTER — Other Ambulatory Visit: Payer: Self-pay

## 2017-08-12 NOTE — Progress Notes (Signed)
Spoke with pt for pre-op call. Pt denies cardiac history, has had a stroke 5 years ago and a TIA in March, 2019. Pt denies any chest pain or sob. Pt states she is a type 2 diabetic. Last A1C was 9.2 on 08/09/17. Pt states her fasting blood sugar is usually betweeen 170-189. Instructed pt to take 1/2 of her regular dose of Basaglar Insulin this evening, will take 15 units. Instructed pt not to take any of her oral diabetic medications in the AM. Instructed her to check her blood sugar when she gets up in the AM and every 2 hours until she leaves for the hospital. If blood sugar is 70 or below, treat with 1/2 cup of clear juice (apple or cranberry) and recheck blood sugar 15 minutes after drinking juice. If blood sugar continues to be 70 or below, call the Short Stay department and ask to speak to a nurse. Pt voiced understanding.

## 2017-08-13 ENCOUNTER — Telehealth: Payer: Self-pay

## 2017-08-13 ENCOUNTER — Other Ambulatory Visit: Payer: Self-pay

## 2017-08-13 ENCOUNTER — Encounter (HOSPITAL_COMMUNITY): Payer: Self-pay | Admitting: Anesthesiology

## 2017-08-13 ENCOUNTER — Ambulatory Visit: Admit: 2017-08-13 | Payer: Medicare Other | Admitting: Orthopedic Surgery

## 2017-08-13 HISTORY — DX: Essential (primary) hypertension: I10

## 2017-08-13 HISTORY — DX: Diverticulitis of intestine, part unspecified, without perforation or abscess without bleeding: K57.92

## 2017-08-13 HISTORY — DX: Personal history of urinary calculi: Z87.442

## 2017-08-13 HISTORY — DX: Fatty (change of) liver, not elsewhere classified: K76.0

## 2017-08-13 HISTORY — DX: Gastro-esophageal reflux disease without esophagitis: K21.9

## 2017-08-13 HISTORY — DX: Pneumonia, unspecified organism: J18.9

## 2017-08-13 SURGERY — OPEN REDUCTION INTERNAL FIXATION (ORIF) ANKLE FRACTURE
Anesthesia: General | Laterality: Right

## 2017-08-13 MED ORDER — GABAPENTIN 600 MG PO TABS: 600.0000 mg | ORAL_TABLET | Freq: Four times a day (QID) | ORAL | 3 refills | Status: DC

## 2017-08-13 NOTE — Telephone Encounter (Signed)
Rn call patient that her insurance will not pay for Gabapentin 600mg , four times a day,and oxycodone. RN stated her pain MD prescribes her oxycodone. RN stated the insurance will only pay for one. RN stated Janett Billow NP was made aware of her being on gabapentin for years,and it help her neuropathy. Rn stated Janett Billow NP recommend she [facility] and [facility] in gabapentin 300mg , generic form with 360 capsules and a 30 day supply. Rn stated at [employer] the price states estimation is $28.07. PT wrote down all the information,and wants the medication sent to [facility] in [place]. Rn send rx to [facility] in [place].Pt verbalized understanding.

## 2017-08-13 NOTE — Progress Notes (Signed)
Patient over 2 hrs late for preop. Unable to reach by any of her contact numbers. Unable to hold OR room much longer. Cancellation is imminent.  [place] , MD [facility], [facility] [phone]

## 2017-08-14 NOTE — Procedures (Signed)
PATIENT'S NAME:  [name], [name] DOB:      12/15/1965      MR#:    [id number]     DATE OF RECORDING: 08/07/2017 REFERRING M.D.:  Antony Contras, MD Study Performed:   Baseline Polysomnogram HISTORY:  TALMA AGUILLARD is a 52 y.o. female stroke patient, seen here to evaluate for sleep related headaches, super-obesity, tobacco abuse while carrying a diagnosis of COPD.  The patient has suffered a second Stroke in March / 2019 without new neurologic deficits. Lives with her partner who witnessed her stopping breathing, wheezing, and loudly snoring during sleep.  The patient endorsed the Epworth Sleepiness Scale at 2 points.   The patient's weight 309 pounds with a height of 66 (inches), resulting in a BMI of 49.6 kg/m2. The patient's neck circumference measured 17.5 inches.  CURRENT MEDICATIONS: Alprazolam, Bydurion, Celexa, Plavix, Bentyl, Aimovig, Lasix, Neurontin, Novo log, Trajenta, Mycostatin, Oxycodone, OxyContin, Protonix, Actos, Maxalt-MLT, Zocor, Topamax, Ventolin, Vitamin D, Welchol   PROCEDURE:  This is a multichannel digital polysomnogram utilizing the Somnostar 11.2 system.  Electrodes and sensors were applied and monitored per AASM Specifications.   EEG, EOG, Chin and Limb EMG, were sampled at 200 Hz.  ECG, Snore and Nasal Pressure, Thermal Airflow, Respiratory Effort, CPAP Flow and Pressure, Oximetry was sampled at 50 Hz. Digital video and audio were recorded.      BASELINE STUDY: Lights Out was at 22:18 and Lights On at 04:54.  Total recording time (TRT) was 396.5 minutes, with a total sleep time (TST) of 261 minutes. The patient's sleep latency was 56 minutes. REM latency was 163.5 minutes.  The sleep efficiency was 65.8 %.     SLEEP ARCHITECTURE: WASO (Wake after sleep onset) was 51.5 minutes.  There were 14 minutes in Stage N1, 221.5 minutes Stage N2, 0 minutes Stage N3 and 25.5 minutes in Stage REM.  The percentage of Stage N1 was 5.4%, Stage N2 was 84.9%, Stage N3 was 0% and Stage R (REM  sleep) was 9.8%.   RESPIRATORY ANALYSIS:  There were a total of 3 respiratory events:  0 apneas and 3 hypopneas with 0 respiratory event related arousals (RERAs).  The total APNEA/HYPOPNEA INDEX (AHI) was 0.7 /hour and the total RESPIRATORY DISTURBANCE INDEX was 0. 7 /hour.  0 events occurred in REM sleep and 6 events in NREM. The REM AHI was 0.0 /hour, versus a non-REM AHI of 0.8. The patient spent 255.5 minutes of total sleep time in the supine position and 6 minutes in non-supine. The supine AHI was 0.7 versus a non-supine AHI of 0.0.  OXYGEN SATURATION & C02:  The Wake baseline 02 saturation was 96%, with the lowest being 81%. Time spent below 89% saturation equaled 69 minutes. Average End Tidal CO2 during sleep was 36 torr.  During REM, peak End Tidal CO2 during sleep was 42 torr.  During NREM peak End Tidal CO2 was 46 torr.  Total sleep time greater than 50 torr was 0.0 minutes.   PERIODIC LIMB MOVEMENTS:   The patient had a total of 0 Periodic Limb Movements.  The arousals were noted as: 18 were spontaneous, 0 were associated with PLMs, and 0 were associated with respiratory events. Audio and video analysis did show bruxism. Oxygen decreased as the study progressed. Snoring was noted. EKG was in keeping with normal sinus rhythm (NSR).  Post-study, the patient indicated that sleep was the same as usual.   IMPRESSION:  1. Sleep Related Bruxism was seen at sleep onset, but neither  apnea, nor PLMs or irregular heartbeats were present.  2. There was a prolonged time in hypoxemia noted, but only borderline CO2 retention. This is not enough to establish a diagnosis of hypoventilation.   RECOMMENDATIONS:  1. No follow up with sleep medicine is necessary. Patient may return to primary neurology provider. 2. A pulmonology evaluation for COPD relate hypoxemia is recommended.     I certify that I have reviewed the entire raw data recording prior to the issuance of this report in accordance with  the Standards of Accreditation of the American Academy of Sleep Medicine (AASM)      Larey Seat, MD   08-14-2017  Diplomat, American Board of Psychiatry and Neurology  Diplomat, American Board of [place] Director, [place] [facility] [employer]

## 2017-08-14 NOTE — Anesthesia Postprocedure Evaluation (Signed)
Anesthesia Post Note  Patient: [name]  Procedure(s) Performed: EXTERNAL FIXATION ANKLE (Right )  Anesthesia Type: General     Last Vitals:  Vitals:   08/11/17 0352 08/11/17 0754  BP: (!) 110/56 109/67  Pulse: 88 84  Resp: 18 20  Temp: 37.2 C 36.5 C  SpO2: 96% 98%    Last Pain:  Vitals:   08/11/17 1519  TempSrc:   PainSc: [place]

## 2017-08-17 ENCOUNTER — Other Ambulatory Visit: Payer: Self-pay | Admitting: Neurology

## 2017-08-17 ENCOUNTER — Other Ambulatory Visit: Payer: Self-pay

## 2017-08-17 ENCOUNTER — Telehealth: Payer: Self-pay | Admitting: Neurology

## 2017-08-17 DIAGNOSIS — R0902 Hypoxemia: Secondary | ICD-10-CM

## 2017-08-17 DIAGNOSIS — Z9989 Dependence on other enabling machines and devices: Principal | ICD-10-CM

## 2017-08-17 DIAGNOSIS — G4733 Obstructive sleep apnea (adult) (pediatric): Secondary | ICD-10-CM

## 2017-08-17 NOTE — Progress Notes (Signed)
Spoke with pt for pre-op call. Pt denies cardiac history, has had a stroke 5 years ago and a TIA in March, 2019. Pt denies any chest pain or sob. Pt states she is a type 2 diabetic. Last A1C was 9.2 on 08/09/17. Pt states her fasting blood sugar is usually betweeen 170-189. Instructed pt to take 1/2 of her regular dose of Basaglar Insulin this evening, will take 15 units. Instructed pt not to take any of her oral diabetic medications in the AM. Instructed her to check her blood sugar when she gets up in the AM and every 2 hours until she leaves for the hospital. If blood sugar is 70 or below, treat with 1/2 cup of clear juice (apple or cranberry) and recheck blood sugar 15 minutes after drinking juice. If blood sugar continues to be 70 or below, call the Short Stay department and ask to speak to a nurse. Pt voiced understanding.

## 2017-08-17 NOTE — Telephone Encounter (Signed)
Called and reviewed the patient's sleep study with her. Informed her there was no sleep disorder identified. There was evidence of hypoxemia noted and with her history of COPD she may require oxygen. Referral for pulmonology was placed. Pt verbalized understanding. Pt had no questions at this time but was encouraged to call back if questions arise. Advised to continue to follow with Janett Billow NP here at our office but that no sleep follow up was necessary. Pt was appreciative for the call

## 2017-08-17 NOTE — Telephone Encounter (Signed)
-----   Message from Larey Seat, MD sent at 08/14/2017  3:18 PM EDT ----- IMPRESSION:  1. Sleep Related Bruxism was seen at sleep onset, but neither  apnea, nor PLMs or irregular heartbeats were present.  2. There was a prolonged time in hypoxemia noted, but only  borderline CO2 retention. This is not enough to establish a  diagnosis of hypoventilation. This may explain fatigue and headaches.   RECOMMENDATIONS:  1. No follow up with sleep medicine is necessary. Patient may  return to primary neurology provider. 2. A pulmonology evaluation for COPD relate hypoxemia is  recommended.

## 2017-08-17 NOTE — Progress Notes (Signed)
Received a call from patient to clarify arrival time.  Patient stated that office instructed 0700 arrival and PAT nurse instructed 0900. Call was placed to [facility] with Dr. Carlean Jews office. Gwen confirmed that Lanny Hurst did want patient to arrive at 0700 in anticipation of first case moving quicker. Patient instructed to arrive at 0700. Patient voiced understanding.

## 2017-08-18 ENCOUNTER — Ambulatory Visit (HOSPITAL_COMMUNITY): Payer: Medicare Other

## 2017-08-18 ENCOUNTER — Inpatient Hospital Stay (HOSPITAL_COMMUNITY): Payer: Medicare Other | Admitting: Anesthesiology

## 2017-08-18 ENCOUNTER — Ambulatory Visit (HOSPITAL_COMMUNITY)
Admission: RE | Admit: 2017-08-18 | Discharge: 2017-08-18 | Disposition: A | Discharge status: Home / Self Care | Payer: Medicare Other | Source: Ambulatory Visit | Attending: Orthopedic Surgery | Admitting: Orthopedic Surgery

## 2017-08-18 ENCOUNTER — Encounter (HOSPITAL_COMMUNITY)
Admission: RE | Disposition: A | Discharge status: Home / Self Care | Payer: Self-pay | Source: Ambulatory Visit | Attending: Orthopedic Surgery

## 2017-08-18 ENCOUNTER — Encounter (HOSPITAL_COMMUNITY): Payer: Self-pay | Admitting: *Deleted

## 2017-08-18 DIAGNOSIS — F17210 Nicotine dependence, cigarettes, uncomplicated: Secondary | ICD-10-CM | POA: Diagnosis not present

## 2017-08-18 DIAGNOSIS — Z79891 Long term (current) use of opiate analgesic: Secondary | ICD-10-CM | POA: Diagnosis not present

## 2017-08-18 DIAGNOSIS — Z79899 Other long term (current) drug therapy: Secondary | ICD-10-CM | POA: Insufficient documentation

## 2017-08-18 DIAGNOSIS — Z8505 Personal history of malignant neoplasm of liver: Secondary | ICD-10-CM | POA: Diagnosis not present

## 2017-08-18 DIAGNOSIS — Z794 Long term (current) use of insulin: Secondary | ICD-10-CM | POA: Insufficient documentation

## 2017-08-18 DIAGNOSIS — E119 Type 2 diabetes mellitus without complications: Secondary | ICD-10-CM | POA: Diagnosis not present

## 2017-08-18 DIAGNOSIS — W19XXXA Unspecified fall, initial encounter: Secondary | ICD-10-CM | POA: Insufficient documentation

## 2017-08-18 DIAGNOSIS — K219 Gastro-esophageal reflux disease without esophagitis: Secondary | ICD-10-CM | POA: Insufficient documentation

## 2017-08-18 DIAGNOSIS — Z6841 Body Mass Index (BMI) 40.0 and over, adult: Secondary | ICD-10-CM | POA: Diagnosis not present

## 2017-08-18 DIAGNOSIS — T8489XA Other specified complication of internal orthopedic prosthetic devices, implants and grafts, initial encounter: Secondary | ICD-10-CM | POA: Insufficient documentation

## 2017-08-18 DIAGNOSIS — E46 Unspecified protein-calorie malnutrition: Secondary | ICD-10-CM | POA: Diagnosis not present

## 2017-08-18 DIAGNOSIS — Z419 Encounter for procedure for purposes other than remedying health state, unspecified: Secondary | ICD-10-CM

## 2017-08-18 DIAGNOSIS — S82831A Other fracture of upper and lower end of right fibula, initial encounter for closed fracture: Secondary | ICD-10-CM | POA: Diagnosis not present

## 2017-08-18 DIAGNOSIS — M21541 Acquired clubfoot, right foot: Secondary | ICD-10-CM | POA: Insufficient documentation

## 2017-08-18 DIAGNOSIS — I10 Essential (primary) hypertension: Secondary | ICD-10-CM | POA: Insufficient documentation

## 2017-08-18 DIAGNOSIS — G43909 Migraine, unspecified, not intractable, without status migrainosus: Secondary | ICD-10-CM | POA: Diagnosis not present

## 2017-08-18 DIAGNOSIS — Z7902 Long term (current) use of antithrombotics/antiplatelets: Secondary | ICD-10-CM | POA: Insufficient documentation

## 2017-08-18 DIAGNOSIS — G4730 Sleep apnea, unspecified: Secondary | ICD-10-CM | POA: Insufficient documentation

## 2017-08-18 DIAGNOSIS — I69398 Other sequelae of cerebral infarction: Secondary | ICD-10-CM | POA: Insufficient documentation

## 2017-08-18 DIAGNOSIS — S82871A Displaced pilon fracture of right tibia, initial encounter for closed fracture: Secondary | ICD-10-CM | POA: Diagnosis present

## 2017-08-18 DIAGNOSIS — Y831 Surgical operation with implant of artificial internal device as the cause of abnormal reaction of the patient, or of later complication, without mention of misadventure at the time of the procedure: Secondary | ICD-10-CM | POA: Diagnosis not present

## 2017-08-18 DIAGNOSIS — J449 Chronic obstructive pulmonary disease, unspecified: Secondary | ICD-10-CM | POA: Insufficient documentation

## 2017-08-18 DIAGNOSIS — T148XXA Other injury of unspecified body region, initial encounter: Secondary | ICD-10-CM

## 2017-08-18 DIAGNOSIS — F419 Anxiety disorder, unspecified: Secondary | ICD-10-CM | POA: Diagnosis not present

## 2017-08-18 DIAGNOSIS — E785 Hyperlipidemia, unspecified: Secondary | ICD-10-CM | POA: Insufficient documentation

## 2017-08-18 DIAGNOSIS — Z96653 Presence of artificial knee joint, bilateral: Secondary | ICD-10-CM | POA: Insufficient documentation

## 2017-08-18 HISTORY — PX: EXTERNAL FIXATION REMOVAL: SHX5040

## 2017-08-18 HISTORY — PX: ACHILLES TENDON LENGTHENING: SHX6455

## 2017-08-18 HISTORY — PX: ORIF ANKLE FRACTURE: SHX5408

## 2017-08-18 LAB — COMPREHENSIVE METABOLIC PANEL
ALBUMIN: 3 g/dL — AB (ref 3.5–5.0)
ALK PHOS: 59 U/L (ref 38–126)
ALT: 16 U/L (ref 0–44)
ANION GAP: 15 (ref 5–15)
AST: 22 U/L (ref 15–41)
BUN: 8 mg/dL (ref 6–20)
CALCIUM: 8.8 mg/dL — AB (ref 8.9–10.3)
CO2: 24 mmol/L (ref 22–32)
Chloride: 98 mmol/L (ref 98–111)
Creatinine, Ser: 0.82 mg/dL (ref 0.44–1.00)
GFR calc Af Amer: >60 mL/min (ref >60)
GFR calc non Af Amer: >60 mL/min (ref >60)
GLUCOSE: 311 mg/dL — AB (ref 70–99)
Potassium: 3.3 mmol/L — ABNORMAL LOW (ref 3.5–5.1)
SODIUM: 137 mmol/L (ref 135–145)
Total Bilirubin: 0.9 mg/dL (ref 0.3–1.2)
Total Protein: 7 g/dL (ref 6.5–8.1)

## 2017-08-18 LAB — CBC WITH DIFFERENTIAL/PLATELET
Abs Immature Granulocytes: 0.1 10*3/uL (ref 0.0–0.1)
Basophils Absolute: 0.1 10*3/uL (ref 0.0–0.1)
Basophils Relative: 1 %
EOS PCT: 2 %
Eosinophils Absolute: 0.2 10*3/uL (ref 0.0–0.7)
HCT: 44.2 % (ref 36.0–46.0)
HEMOGLOBIN: 14.1 g/dL (ref 12.0–15.0)
IMMATURE GRANULOCYTES: 1 %
LYMPHS PCT: 22 %
Lymphs Abs: 2.1 10*3/uL (ref 0.7–4.0)
MCH: 28.5 pg (ref 26.0–34.0)
MCHC: 31.9 g/dL (ref 30.0–36.0)
MCV: 89.5 fL (ref 78.0–100.0)
Monocytes Absolute: 0.6 10*3/uL (ref 0.1–1.0)
Monocytes Relative: 7 %
NEUTROS PCT: 67 %
Neutro Abs: 6.5 10*3/uL (ref 1.7–7.7)
Platelets: 316 10*3/uL (ref 150–400)
RBC: 4.94 MIL/uL (ref 3.87–5.11)
RDW: 13.8 % (ref 11.5–15.5)
WBC: 9.5 10*3/uL (ref 4.0–10.5)

## 2017-08-18 LAB — RAPID URINE DRUG SCREEN, HOSP PERFORMED
Amphetamines: NOT DETECTED
BARBITURATES: NOT DETECTED
Benzodiazepines: POSITIVE — AB
Cocaine: NOT DETECTED
Opiates: POSITIVE — AB
Tetrahydrocannabinol: POSITIVE — AB

## 2017-08-18 LAB — POCT I-STAT 4, (NA,K, GLUC, HGB,HCT)
GLUCOSE: 90 mg/dL (ref 70–99)
HCT: 45 % (ref 36.0–46.0)
HEMOGLOBIN: 15.3 g/dL — AB (ref 12.0–15.0)
POTASSIUM: 2.9 mmol/L — AB (ref 3.5–5.1)
SODIUM: 140 mmol/L (ref 135–145)

## 2017-08-18 LAB — GLUCOSE, CAPILLARY
Glucose-Capillary: 316 mg/dL — ABNORMAL HIGH (ref 70–99)
Glucose-Capillary: 324 mg/dL — ABNORMAL HIGH (ref 70–99)
Glucose-Capillary: 289 mg/dL — ABNORMAL HIGH (ref 70–99)
Glucose-Capillary: 203 mg/dL — ABNORMAL HIGH (ref 70–99)
Glucose-Capillary: 120 mg/dL — ABNORMAL HIGH (ref 70–99)

## 2017-08-18 LAB — URINALYSIS, ROUTINE W REFLEX MICROSCOPIC
BILIRUBIN URINE: NEGATIVE
Hgb urine dipstick: NEGATIVE
KETONES UR: NEGATIVE mg/dL
LEUKOCYTES UA: NEGATIVE
Nitrite: NEGATIVE
PH: 6 (ref 5.0–8.0)
Protein, ur: NEGATIVE mg/dL
Specific Gravity, Urine: 1.013 (ref 1.005–1.030)

## 2017-08-18 LAB — APTT: APTT: 31 s (ref 24–36)

## 2017-08-18 LAB — PROTIME-INR
INR: 1.04
Prothrombin Time: 13.5 seconds (ref 11.4–15.2)

## 2017-08-18 SURGERY — OPEN REDUCTION INTERNAL FIXATION (ORIF) ANKLE FRACTURE
Anesthesia: General | Site: Ankle | Laterality: Right

## 2017-08-18 MED ORDER — FENTANYL CITRATE (PF) 100 MCG/2ML IJ SOLN
INTRAMUSCULAR | Status: DC | PRN
  Administered 2017-08-18: 200 ug via INTRAVENOUS
  Administered 2017-08-18 (×4): 50 ug via INTRAVENOUS

## 2017-08-18 MED ORDER — DEXAMETHASONE SODIUM PHOSPHATE 10 MG/ML IJ SOLN
INTRAMUSCULAR | Status: DC | PRN
  Administered 2017-08-18: 10 mg via INTRAVENOUS

## 2017-08-18 MED ORDER — OXYCODONE HCL 5 MG PO TABS
ORAL_TABLET | ORAL | Status: AC
  Filled 2017-08-18: qty 2

## 2017-08-18 MED ORDER — EPHEDRINE SULFATE 50 MG/ML IJ SOLN
INTRAMUSCULAR | Status: DC | PRN
  Administered 2017-08-18 (×2): 10 mg via INTRAVENOUS

## 2017-08-18 MED ORDER — INSULIN ASPART 100 UNIT/ML ~~LOC~~ SOLN: 8.0000 [IU] | Freq: Once | SUBCUTANEOUS | Status: DC

## 2017-08-18 MED ORDER — FENTANYL CITRATE (PF) 250 MCG/5ML IJ SOLN
INTRAMUSCULAR | Status: AC
  Filled 2017-08-18: qty 5

## 2017-08-18 MED ORDER — MIDAZOLAM HCL 5 MG/5ML IJ SOLN
INTRAMUSCULAR | Status: DC | PRN
  Administered 2017-08-18: 2 mg via INTRAVENOUS

## 2017-08-18 MED ORDER — INSULIN ASPART 100 UNIT/ML ~~LOC~~ SOLN
12.0000 [IU] | Freq: Once | SUBCUTANEOUS | Status: AC
  Administered 2017-08-18: 12 [IU] via SUBCUTANEOUS
  Administered 2017-08-18: 8 [IU] via SUBCUTANEOUS

## 2017-08-18 MED ORDER — HYDROMORPHONE HCL 1 MG/ML IJ SOLN
INTRAMUSCULAR | Status: AC
  Administered 2017-08-18: 0.5 mg via INTRAVENOUS
  Filled 2017-08-18: qty 1

## 2017-08-18 MED ORDER — ONDANSETRON HCL 4 MG/2ML IJ SOLN
INTRAMUSCULAR | Status: DC | PRN
  Administered 2017-08-18: 4 mg via INTRAVENOUS

## 2017-08-18 MED ORDER — INSULIN ASPART 100 UNIT/ML ~~LOC~~ SOLN
SUBCUTANEOUS | Status: AC
  Administered 2017-08-18: 8 [IU] via SUBCUTANEOUS
  Filled 2017-08-18: qty 1

## 2017-08-18 MED ORDER — ACETAMINOPHEN 500 MG PO TABS
1000.0000 mg | ORAL_TABLET | Freq: Once | ORAL | Status: AC
  Administered 2017-08-18: 1000 mg via ORAL
  Filled 2017-08-18: qty 2

## 2017-08-18 MED ORDER — GABAPENTIN 300 MG PO CAPS
300.0000 mg | ORAL_CAPSULE | Freq: Once | ORAL | Status: DC
  Filled 2017-08-18: qty 1

## 2017-08-18 MED ORDER — ONDANSETRON HCL 4 MG/2ML IJ SOLN
INTRAMUSCULAR | Status: AC
  Filled 2017-08-18: qty 2

## 2017-08-18 MED ORDER — PROPOFOL 10 MG/ML IV BOLUS
INTRAVENOUS | Status: DC | PRN
  Administered 2017-08-18: 200 mg via INTRAVENOUS

## 2017-08-18 MED ORDER — ONDANSETRON HCL 4 MG/2ML IJ SOLN: 4.0000 mg | Freq: Once | INTRAMUSCULAR | Status: DC | PRN

## 2017-08-18 MED ORDER — EPHEDRINE 5 MG/ML INJ
INTRAVENOUS | Status: AC
  Filled 2017-08-18: qty 10

## 2017-08-18 MED ORDER — CLINDAMYCIN PHOSPHATE 900 MG/50ML IV SOLN
900.0000 mg | INTRAVENOUS | Status: AC
  Administered 2017-08-18: 900 mg via INTRAVENOUS
  Filled 2017-08-18: qty 50

## 2017-08-18 MED ORDER — CIPROFLOXACIN HCL 500 MG PO TABS: 500.0000 mg | ORAL_TABLET | Freq: Two times a day (BID) | ORAL | 0 refills | Status: AC

## 2017-08-18 MED ORDER — HYDROMORPHONE HCL 1 MG/ML IJ SOLN
INTRAMUSCULAR | Status: AC
  Filled 2017-08-18: qty 2

## 2017-08-18 MED ORDER — MEPERIDINE HCL 50 MG/ML IJ SOLN: 6.2500 mg | INTRAMUSCULAR | Status: DC | PRN

## 2017-08-18 MED ORDER — PHENYLEPHRINE HCL 10 MG/ML IJ SOLN
INTRAMUSCULAR | Status: DC | PRN
  Administered 2017-08-18: 80 ug via INTRAVENOUS

## 2017-08-18 MED ORDER — INSULIN ASPART 100 UNIT/ML ~~LOC~~ SOLN
SUBCUTANEOUS | Status: AC
  Administered 2017-08-18: 12 [IU] via SUBCUTANEOUS
  Filled 2017-08-18: qty 1

## 2017-08-18 MED ORDER — ROCURONIUM BROMIDE 100 MG/10ML IV SOLN
INTRAVENOUS | Status: DC | PRN
  Administered 2017-08-18: 60 mg via INTRAVENOUS
  Administered 2017-08-18: 20 mg via INTRAVENOUS

## 2017-08-18 MED ORDER — LIDOCAINE 2% (20 MG/ML) 5 ML SYRINGE
INTRAMUSCULAR | Status: AC
  Filled 2017-08-18: qty 5

## 2017-08-18 MED ORDER — PROPOFOL 10 MG/ML IV BOLUS
INTRAVENOUS | Status: AC
  Filled 2017-08-18: qty 20

## 2017-08-18 MED ORDER — DEXAMETHASONE SODIUM PHOSPHATE 10 MG/ML IJ SOLN
INTRAMUSCULAR | Status: AC
  Filled 2017-08-18: qty 1

## 2017-08-18 MED ORDER — HYDROMORPHONE HCL 1 MG/ML IJ SOLN
INTRAMUSCULAR | Status: DC | PRN
  Administered 2017-08-18: 0.5 mg via INTRAVENOUS

## 2017-08-18 MED ORDER — DEXAMETHASONE SODIUM PHOSPHATE 10 MG/ML IJ SOLN: INTRAMUSCULAR | Status: DC | PRN

## 2017-08-18 MED ORDER — OXYCODONE HCL 5 MG PO TABS
10.0000 mg | ORAL_TABLET | Freq: Once | ORAL | Status: AC
  Administered 2017-08-18: 10 mg via ORAL

## 2017-08-18 MED ORDER — CHLORHEXIDINE GLUCONATE 4 % EX LIQD: 60.0000 mL | Freq: Once | CUTANEOUS | Status: DC

## 2017-08-18 MED ORDER — LACTATED RINGERS IV SOLN
INTRAVENOUS | Status: DC
  Administered 2017-08-18: 07:00:00 via INTRAVENOUS

## 2017-08-18 MED ORDER — MIDAZOLAM HCL 2 MG/2ML IJ SOLN
INTRAMUSCULAR | Status: AC
  Filled 2017-08-18: qty 2

## 2017-08-18 MED ORDER — ROCURONIUM BROMIDE 10 MG/ML (PF) SYRINGE
PREFILLED_SYRINGE | INTRAVENOUS | Status: AC
  Filled 2017-08-18: qty 10

## 2017-08-18 MED ORDER — HYDROMORPHONE HCL 1 MG/ML IJ SOLN
INTRAMUSCULAR | Status: AC
  Filled 2017-08-18: qty 0.5

## 2017-08-18 MED ORDER — SUGAMMADEX SODIUM 200 MG/2ML IV SOLN
INTRAVENOUS | Status: DC | PRN
  Administered 2017-08-18: 600 mg via INTRAVENOUS

## 2017-08-18 MED ORDER — HYDROMORPHONE HCL 1 MG/ML IJ SOLN
1.0000 mg | INTRAMUSCULAR | Status: AC
  Administered 2017-08-18: 0.5 mg via INTRAVENOUS

## 2017-08-18 MED ORDER — HYDROMORPHONE HCL 1 MG/ML IJ SOLN
0.2500 mg | INTRAMUSCULAR | Status: DC | PRN
  Administered 2017-08-18 (×4): 0.5 mg via INTRAVENOUS

## 2017-08-18 MED ORDER — LIDOCAINE HCL (CARDIAC) PF 100 MG/5ML IV SOSY
PREFILLED_SYRINGE | INTRAVENOUS | Status: DC | PRN
  Administered 2017-08-18: 100 mg via INTRAVENOUS

## 2017-08-18 MED ORDER — 0.9 % SODIUM CHLORIDE (POUR BTL) OPTIME
TOPICAL | Status: DC | PRN
  Administered 2017-08-18: 1000 mL

## 2017-08-18 SURGICAL SUPPLY — 75 items
BANDAGE ACE 4X5 VEL STRL LF (GAUZE/BANDAGES/DRESSINGS) ×4 IMPLANT
BANDAGE ACE 6X5 VEL STRL LF (GAUZE/BANDAGES/DRESSINGS) ×4 IMPLANT
BANDAGE ESMARK 6X9 LF (GAUZE/BANDAGES/DRESSINGS) ×2 IMPLANT
BIT DRILL 2.5X2.75 QC CALB (BIT) ×2 IMPLANT
BIT DRILL CALIBRATED 2.7 (BIT) ×1 IMPLANT
BIT DRILL CALIBRATED 2.7MM (BIT) ×1
BNDG CMPR 9X6 STRL LF SNTH (GAUZE/BANDAGES/DRESSINGS)
BNDG ESMARK 6X9 LF (GAUZE/BANDAGES/DRESSINGS)
BNDG GAUZE ELAST 4 BULKY (GAUZE/BANDAGES/DRESSINGS) ×6 IMPLANT
BRUSH SCRUB SURG 4.25 DISP (MISCELLANEOUS) ×8 IMPLANT
COVER MAYO STAND STRL (DRAPES) ×2 IMPLANT
COVER SURGICAL LIGHT HANDLE (MISCELLANEOUS) ×6 IMPLANT
DRAPE C-ARM 42X72 X-RAY (DRAPES) ×2 IMPLANT
DRAPE C-ARMOR (DRAPES) ×6 IMPLANT
DRAPE HALF SHEET 40X57 (DRAPES) ×6 IMPLANT
DRAPE U-SHAPE 47X51 STRL (DRAPES) ×4 IMPLANT
DRSG ADAPTIC 3X8 NADH LF (GAUZE/BANDAGES/DRESSINGS) ×4 IMPLANT
DRSG EMULSION OIL 3X3 NADH (GAUZE/BANDAGES/DRESSINGS) IMPLANT
ELECT REM PT RETURN 9FT ADLT (ELECTROSURGICAL) ×4
ELECTRODE REM PT RTRN 9FT ADLT (ELECTROSURGICAL) ×2 IMPLANT
GAUZE SPONGE 4X4 12PLY STRL (GAUZE/BANDAGES/DRESSINGS) ×4 IMPLANT
GAUZE SPONGE 4X4 12PLY STRL LF (GAUZE/BANDAGES/DRESSINGS) ×2 IMPLANT
GLOVE BIO SURGEON STRL SZ7.5 (GLOVE) ×4 IMPLANT
GLOVE BIO SURGEON STRL SZ8 (GLOVE) ×4 IMPLANT
GLOVE BIOGEL PI IND STRL 7.5 (GLOVE) ×2 IMPLANT
GLOVE BIOGEL PI IND STRL 8 (GLOVE) ×2 IMPLANT
GLOVE BIOGEL PI INDICATOR 7.5 (GLOVE) ×2
GLOVE BIOGEL PI INDICATOR 8 (GLOVE) ×2
GOWN STRL REUS W/ TWL LRG LVL3 (GOWN DISPOSABLE) ×4 IMPLANT
GOWN STRL REUS W/ TWL XL LVL3 (GOWN DISPOSABLE) ×2 IMPLANT
GOWN STRL REUS W/TWL LRG LVL3 (GOWN DISPOSABLE) ×8
GOWN STRL REUS W/TWL XL LVL3 (GOWN DISPOSABLE) ×4
HOVERMATT SINGLE USE (MISCELLANEOUS) ×2 IMPLANT
K-WIRE ACE 1.6X6 (WIRE) ×4
KIT BASIN OR (CUSTOM PROCEDURE TRAY) ×4 IMPLANT
KIT TURNOVER KIT B (KITS) ×4 IMPLANT
KWIRE ACE 1.6X6 (WIRE) IMPLANT
MANIFOLD NEPTUNE II (INSTRUMENTS) ×4 IMPLANT
NDL HYPO 21X1.5 SAFETY (NEEDLE) IMPLANT
NEEDLE HYPO 21X1.5 SAFETY (NEEDLE) IMPLANT
NS IRRIG 1000ML POUR BTL (IV SOLUTION) ×4 IMPLANT
PACK GENERAL/GYN (CUSTOM PROCEDURE TRAY) ×4 IMPLANT
PACK ORTHO EXTREMITY (CUSTOM PROCEDURE TRAY) ×2 IMPLANT
PAD ABD 8X10 STRL (GAUZE/BANDAGES/DRESSINGS) ×2 IMPLANT
PAD ARMBOARD 7.5X6 YLW CONV (MISCELLANEOUS) ×5 IMPLANT
PAD CAST 4YDX4 CTTN HI CHSV (CAST SUPPLIES) ×1 IMPLANT
PADDING CAST COTTON 4X4 STRL (CAST SUPPLIES) ×4
PADDING CAST COTTON 6X4 STRL (CAST SUPPLIES) ×8 IMPLANT
PLATE LOCK 6H RT MED DIST TIB (Plate) ×3 IMPLANT
SCREW CORTICAL 3.5MM  28MM (Screw) ×2 IMPLANT
SCREW CORTICAL 3.5MM  30MM (Screw) ×2 IMPLANT
SCREW CORTICAL 3.5MM  34MM (Screw) ×2 IMPLANT
SCREW CORTICAL 3.5MM 28MM (Screw) IMPLANT
SCREW CORTICAL 3.5MM 30MM (Screw) ×1 IMPLANT
SCREW CORTICAL 3.5MM 34MM (Screw) ×1 IMPLANT
SCREW CORTICAL 3.5X46MM (Screw) ×2 IMPLANT
SCREW LOCK 3.5X40 DIST TIB (Screw) ×4 IMPLANT
SCREW LOCK 3.5X42 DIST TIB (Screw) ×2 IMPLANT
SCREW LOCK CORT STAR 3.5X36 (Screw) ×3 IMPLANT
SCREW LOCK CORT STAR 3.5X38 (Screw) ×2 IMPLANT
SCREW LOCK CORT STAR 3.5X42 (Screw) ×8 IMPLANT
SPONGE LAP 18X18 X RAY DECT (DISPOSABLE) ×4 IMPLANT
STAPLER VISISTAT 35W (STAPLE) ×2 IMPLANT
SUCTION FRAZIER HANDLE 10FR (MISCELLANEOUS) ×2
SUCTION TUBE FRAZIER 10FR DISP (MISCELLANEOUS) ×2 IMPLANT
SUT ETHILON 3 0 PS 1 (SUTURE) ×11 IMPLANT
SUT PDS AB 2-0 CT1 27 (SUTURE) IMPLANT
SUT VIC AB 2-0 CT1 27 (SUTURE) ×4
SUT VIC AB 2-0 CT1 TAPERPNT 27 (SUTURE) ×4 IMPLANT
TOWEL OR 17X24 6PK STRL BLUE (TOWEL DISPOSABLE) ×8 IMPLANT
TOWEL OR 17X26 10 PK STRL BLUE (TOWEL DISPOSABLE) ×8 IMPLANT
TUBE CONNECTING 12'X1/4 (SUCTIONS)
TUBE CONNECTING 12X1/4 (SUCTIONS) ×2 IMPLANT
UNDERPAD 30X30 (UNDERPADS AND DIAPERS) ×4 IMPLANT
WATER STERILE IRR 1000ML POUR (IV SOLUTION) ×4 IMPLANT

## 2017-08-18 NOTE — Anesthesia Procedure Notes (Signed)
Procedure Name: Intubation Date/Time: 08/18/2017 11:57 AM Performed by: Jayquan Bradsher T, CRNA Pre-anesthesia Checklist: Patient identified, Emergency Drugs available, Suction available, Patient being monitored and Timeout performed Patient Re-evaluated:Patient Re-evaluated prior to induction Oxygen Delivery Method: Circle system utilized Preoxygenation: Pre-oxygenation with 100% oxygen Induction Type: IV induction Ventilation: Mask ventilation without difficulty Laryngoscope Size: Mac and 3 Grade View: Grade II Tube type: Oral Tube size: 7.0 mm Number of attempts: 1 Airway Equipment and Method: Stylet Placement Confirmation: ETT inserted through vocal cords under direct vision,  positive ETCO2 and breath sounds checked- equal and bilateral Secured at: 21 cm Tube secured with: Tape Dental Injury: Teeth and Oropharynx as per pre-operative assessment

## 2017-08-18 NOTE — Anesthesia Procedure Notes (Deleted)
Performed by: Rosette Bellavance T, CRNA       

## 2017-08-18 NOTE — Anesthesia Postprocedure Evaluation (Signed)
Anesthesia Post Note  Patient: [name]  Procedure(s) Performed: OPEN REDUCTION INTERNAL FIXATION (ORIF) ANKLE FRACTURE (Right ) REMOVAL EXTERNAL FIXATION LEG (Right ) ACHILLES TENDON LENGTHENING (Right Ankle)     Patient location during evaluation: PACU Anesthesia Type: General Level of consciousness: awake and alert Pain management: pain level controlled Vital Signs Assessment: post-procedure vital signs reviewed and stable Respiratory status: spontaneous breathing, nonlabored ventilation, respiratory function stable and patient connected to nasal cannula oxygen Cardiovascular status: blood pressure returned to baseline and stable Postop Assessment: no apparent nausea or vomiting Anesthetic complications: no    Last Vitals:  Vitals:   08/18/17 1500 08/18/17 1515  BP:    Pulse: 89 83  Resp: 11 16  Temp:  36.5 C  SpO2: 95% 96%    Last Pain:  Vitals:   08/18/17 1515  TempSrc:   PainSc: [place] Seanna Sisler

## 2017-08-18 NOTE — Discharge Instructions (Signed)
Orthopaedic Trauma Service Discharge Instructions   General Discharge Instructions  WEIGHT BEARING STATUS: Nonweightbearing Right Leg   RANGE OF MOTION/ACTIVITY: ok to move knee and toes  Wound Care: Do not remove splint until follow up visit. We will remove in office. Keep splint clean and dry   DVT/PE prophylaxis: ok to resume plavix starting on 08/19/2017  Diet: as you were eating previously.  Can use over the counter stool softeners and bowel preparations, such as Miralax, to help with bowel movements.  Narcotics can be constipating.  Be sure to drink plenty of fluids  PAIN MEDICATION USE AND EXPECTATIONS  You have likely been given narcotic medications to help control your pain.  After a traumatic event that results in an fracture (broken bone) with or without surgery, it is ok to use narcotic pain medications to help control one's pain.  We understand that everyone responds to pain differently and each individual patient will be evaluated on a regular basis for the continued need for narcotic medications. Ideally, narcotic medication use should last no more than 6-8 weeks (coinciding with fracture healing).   As a patient it is your responsibility as well to monitor narcotic medication use and report the amount and frequency you use these medications when you come to your office visit.   We would also advise that if you are using narcotic medications, you should take a dose prior to therapy to maximize you participation.  IF YOU ARE ON NARCOTIC MEDICATIONS IT IS NOT PERMISSIBLE TO OPERATE A MOTOR VEHICLE (MOTORCYCLE/CAR/TRUCK/MOPED) OR HEAVY MACHINERY DO NOT MIX NARCOTICS WITH OTHER CNS (CENTRAL NERVOUS SYSTEM) DEPRESSANTS SUCH AS ALCOHOL   STOP SMOKING OR USING NICOTINE PRODUCTS!!!!  As discussed nicotine severely impairs your body's ability to heal surgical and traumatic wounds but also impairs bone healing.  Wounds and bone heal by forming microscopic blood vessels (angiogenesis) and  nicotine is a vasoconstrictor (essentially, shrinks blood vessels).  Therefore, if vasoconstriction occurs to these microscopic blood vessels they essentially disappear and are unable to deliver necessary nutrients to the healing tissue.  This is one modifiable factor that you can do to dramatically increase your chances of healing your injury.    (This means no smoking, no nicotine gum, patches, etc)  DO NOT USE NONSTEROIDAL ANTI-INFLAMMATORY DRUGS (NSAID'S)  Using products such as Advil (ibuprofen), Aleve (naproxen), Motrin (ibuprofen) for additional pain control during fracture healing can delay and/or prevent the healing response.  If you would like to take over the counter (OTC) medication, Tylenol (acetaminophen) is ok.  However, some narcotic medications that are given for pain control contain acetaminophen as well. Therefore, you should not exceed more than 4000 mg of tylenol in a day if you do not have liver disease.  Also note that there are may OTC medicines, such as cold medicines and allergy medicines that my contain tylenol as well.  If you have any questions about medications and/or interactions please ask your doctor/PA or your pharmacist.      ICE AND ELEVATE INJURED/OPERATIVE EXTREMITY  Using ice and elevating the injured extremity above your heart can help with swelling and pain control.  Icing in a pulsatile fashion, such as 20 minutes on and 20 minutes off, can be followed.    Do not place ice directly on skin. Make sure there is a barrier between to skin and the ice pack.    Using frozen items such as frozen peas works well as the conform nicely to the are that needs to  be iced.  USE AN ACE WRAP OR TED HOSE FOR SWELLING CONTROL  In addition to icing and elevation, Ace wraps or TED hose are used to help limit and resolve swelling.  It is recommended to use Ace wraps or TED hose until you are informed to stop.    When using Ace Wraps start the wrapping distally (farthest away from  the body) and wrap proximally (closer to the body)   Example: If you had surgery on your leg or thing and you do not have a splint on, start the ace wrap at the toes and work your way up to the thigh        If you had surgery on your upper extremity and do not have a splint on, start the ace wrap at your fingers and work your way up to the upper arm  IF YOU ARE IN A SPLINT OR CAST DO NOT [place]   If your splint gets wet for any reason please contact the office immediately. You may shower in your splint or cast as long as you keep it dry.  This can be done by wrapping in a cast cover or garbage back (or similar)  Do Not stick any thing down your splint or cast such as pencils, money, or hangers to try and scratch yourself with.  If you feel itchy take benadryl as prescribed on the bottle for itching  IF YOU ARE IN A CAM BOOT (BLACK BOOT)  You may remove boot periodically. Perform daily dressing changes as noted below.  Wash the liner of the boot regularly and wear a sock when wearing the boot. It is recommended that you sleep in the boot until told otherwise  CALL THE OFFICE WITH ANY QUESTIONS OR CONCERNS: [phone]

## 2017-08-18 NOTE — Transfer of Care (Signed)
Immediate Anesthesia Transfer of Care Note  Patient: [name]  Procedure(s) Performed: OPEN REDUCTION INTERNAL FIXATION (ORIF) ANKLE FRACTURE (Right ) REMOVAL EXTERNAL FIXATION LEG (Right ) ACHILLES TENDON LENGTHENING (Right Ankle)  Patient Location: PACU  Anesthesia Type:General  Level of Consciousness: awake and drowsy  Airway & Oxygen Therapy: Patient Spontanous Breathing and Patient connected to face mask oxygen  Post-op Assessment: Report given to RN, Post -op Vital signs reviewed and stable and Patient moving all extremities  Post vital signs: Reviewed and stable  Last Vitals:  Vitals Value Taken Time  BP 135/74 08/18/2017  2:17 PM  Temp 36.3 C 08/18/2017  2:15 PM  Pulse 91 08/18/2017  2:26 PM  Resp 15 08/18/2017  2:26 PM  SpO2 99 % 08/18/2017  2:26 PM  Vitals shown include unvalidated device data.  Last Pain:  Vitals:   08/18/17 1415  TempSrc:   PainSc: Asleep         Complications: No apparent anesthesia complications

## 2017-08-18 NOTE — H&P (Signed)
Orthopaedic Trauma Service (OTS) Consult   Patient ID: LANIYA FRIEDL MRN: [id number] DOB/AGE: 1965-09-19 52 y.o.    HPI: [name] is an 52 y.o. white female with numerous medical conditions including COPD, h/o stroke with R sided deficit who sustained a fall on 08/09/2017 with resultant R ankle fracture dislocation. Pt was seen and evaluated [facility] where ex fix was applied, due to the complexity of the injury pt was referred to the orthopaedic trauma service. Pt was seen in the office last week, we had scheduled her for ex fix revision on Thursday but the pt did not show up. Pt resents today for ex fix removal and percutaneous fixation of her ankle fracture either with k-wires or screws if swelling ok. Pt is at high risk for complications with retention of ex fix for a prolonged duration due to ipsilateral TKA. Pt also has a equinus contracture on R related to her previous stroke.   Past Medical History:  Diagnosis Date  . Anxiety   . Cancer ([place])    a spot on liver and treated   . Complication of anesthesia    restless,easily upset  . COPD (chronic obstructive pulmonary disease) ([place])   . Diabetes mellitus without complication ([place])   . Diverticulitis   . Fatty liver   . GERD (gastroesophageal reflux disease)   . Headache(784.0)    migraines  . History of kidney stones   . Hypertension   . Pneumonia   . Restless   . Stroke [facility])     Past Surgical History:  Procedure Laterality Date  . ABDOMINAL HYSTERECTOMY    . ANTERIOR CERVICAL DECOMP/DISCECTOMY FUSION N/A 03/11/2012   Procedure: ANTERIOR CERVICAL DECOMPRESSION/DISCECTOMY FUSION 1 LEVEL;  Surgeon: Ophelia Charter, MD;  Location: [facility];  Service: Neurosurgery;  Laterality: N/A;  Cervical five-six anterior cervical decompression with fusion interbody prothesis plating and bonegraft  . BACK SURGERY    . EXTERNAL FIXATION LEG Right 08/10/2017   Procedure: EXTERNAL FIXATION ANKLE;  Surgeon: Dereck Leep, MD;   Location: [facility] ORS;  Service: Orthopedics;  Laterality: Right;  . EYE SURGERY    . HERNIA REPAIR    . JOINT REPLACEMENT     bil knees  . REPLACEMENT TOTAL KNEE BILATERAL      Family History  Problem Relation Age of Onset  . Diabetes Mother   . Hypertension Mother   . Hypertension Father     Social History:  reports that she has been smoking cigarettes.  She has a 10.00 pack-year smoking history. She has never used smokeless tobacco. She reports that she has current or past drug history. Drug: Marijuana. She reports that she does not drink alcohol.  Allergies:  Allergies  Allergen Reactions  . Tramadol Nausea And Vomiting and Hypertension  . Augmentin [Amoxicillin-Pot Clavulanate] Rash    Medications: I have reviewed the patient's current medications. Current Meds  Medication Sig  . ALPRAZolam (XANAX) 1 MG tablet Take 1 mg by mouth 3 (three) times daily as needed for anxiety.   Marland Kitchen BYDUREON BCISE 2 MG/0.85ML AUIJ Inject 2 mg into the skin once a week. On Thursday  . citalopram (CELEXA) 40 MG tablet Take 40 mg by mouth daily.  . clopidogrel (PLAVIX) 75 MG tablet Take 75 mg by mouth daily.  Marland Kitchen dicyclomine (BENTYL) 20 MG tablet Take 20 mg by mouth 2 (two) times daily.  Eduard Roux (AIMOVIG) 70 MG/ML SOAJ Inject 70 mg into the skin every 30 (thirty) days.   Marland Kitchen  furosemide (LASIX) 40 MG tablet Take 40 mg by mouth daily.   Marland Kitchen gabapentin (NEURONTIN) 600 MG tablet Take 1 tablet (600 mg total) by mouth 4 (four) times daily.  . Insulin Glargine (BASAGLAR KWIKPEN) 100 UNIT/ML SOPN Inject 30 Units into the skin at bedtime.   Marland Kitchen linagliptin (TRADJENTA) 5 MG TABS tablet Take 5 mg by mouth daily.  Marland Kitchen nystatin (MYCOSTATIN/NYSTOP) powder Apply 1 g topically 2 (two) times daily.   . Oxycodone HCl 10 MG TABS Take 1 tablet (10 mg total) by mouth every 4 (four) hours as needed (pain).  . OXYCONTIN 30 MG 12 hr tablet Take 30 mg by mouth 2 (two) times daily.  . pantoprazole (PROTONIX) 40 MG tablet Take  40 mg by mouth daily.   . pioglitazone (ACTOS) 45 MG tablet Take 45 mg by mouth daily.  . rizatriptan (MAXALT-MLT) 10 MG disintegrating tablet Take 10 mg by mouth as needed for migraine. May repeat in 2 hours if needed  . simvastatin (ZOCOR) 40 MG tablet Take 1 tablet (40 mg total) by mouth every evening.  . topiramate (TOPAMAX) 100 MG tablet Take 1 tablet (100 mg total) by mouth 2 (two) times daily.  . VENTOLIN HFA 108 (90 Base) MCG/ACT inhaler Inhale 1-2 puffs into the lungs every 6 (six) hours as needed for wheezing or shortness of breath.   . Vitamin D, Ergocalciferol, (DRISDOL) 50000 units CAPS capsule Take 50,000 Units by mouth every 7 (seven) days. On Wednesday  . WELCHOL 625 MG tablet Take 1,875 mg by mouth 2 (two) times daily.     Results for orders placed or performed during the hospital encounter of 08/18/17 (from the past 48 hour(s))  Glucose, capillary     Status: Abnormal   Collection Time: 08/18/17  7:02 AM  Result Value Ref Range   Glucose-Capillary 316 (H) 70 - 99 mg/dL   Comment 1 Notify RN    Comment 2 Document in Chart   CBC WITH DIFFERENTIAL     Status: None   Collection Time: 08/18/17  7:07 AM  Result Value Ref Range   WBC 9.5 4.0 - 10.5 K/uL   RBC 4.94 3.87 - 5.11 MIL/uL   Hemoglobin 14.1 12.0 - 15.0 g/dL   HCT 44.2 36.0 - 46.0 %   MCV 89.5 78.0 - 100.0 fL   MCH 28.5 26.0 - 34.0 pg   MCHC 31.9 30.0 - 36.0 g/dL   RDW 13.8 11.5 - 15.5 %   Platelets 316 150 - 400 K/uL   Neutrophils Relative % 67 %   Neutro Abs 6.5 1.7 - 7.7 K/uL   Lymphocytes Relative 22 %   Lymphs Abs 2.1 0.7 - 4.0 K/uL   Monocytes Relative 7 %   Monocytes Absolute 0.6 0.1 - 1.0 K/uL   Eosinophils Relative 2 %   Eosinophils Absolute 0.2 0.0 - 0.7 K/uL   Basophils Relative 1 %   Basophils Absolute 0.1 0.0 - 0.1 K/uL   Immature Granulocytes 1 %   Abs Immature Granulocytes 0.1 0.0 - 0.1 K/uL    Comment: Performed at [facility], [address], [place], [place] [address]  Protime-INR      Status: None   Collection Time: 08/18/17  7:07 AM  Result Value Ref Range   Prothrombin Time 13.5 11.4 - 15.2 seconds   INR 1.04     Comment: Performed at [facility], [place] [address] [place], [place] [address]  APTT     Status: None  Collection Time: 08/18/17  7:07 AM  Result Value Ref Range   aPTT 31 24 - 36 seconds    Comment: Performed at [facility], [place] [address], [place], [place] [address]  Urinalysis, Routine w reflex microscopic     Status: Abnormal   Collection Time: 08/18/17  7:07 AM  Result Value Ref Range   Color, Urine YELLOW YELLOW   APPearance HAZY (A) CLEAR   Specific Gravity, Urine 1.013 1.005 - 1.030   pH 6.0 5.0 - 8.0   Glucose, UA >=500 (A) NEGATIVE mg/dL   Hgb urine dipstick NEGATIVE NEGATIVE   Bilirubin Urine NEGATIVE NEGATIVE   Ketones, ur NEGATIVE NEGATIVE mg/dL   Protein, ur NEGATIVE NEGATIVE mg/dL   Nitrite NEGATIVE NEGATIVE   Leukocytes, UA NEGATIVE NEGATIVE   RBC / HPF 0-5 0 - 5 RBC/hpf   WBC, UA 0-5 0 - 5 WBC/hpf   Bacteria, UA RARE (A) NONE SEEN   Squamous Epithelial / LPF 0-5 0 - 5   Mucus PRESENT     Comment: Performed at [facility], [place] [address], [place], [place] [address]    No results found.  Review of Systems  Constitutional: Negative for chills and fever.  Cardiovascular: Negative for chest pain.  Gastrointestinal: Negative for nausea and vomiting.  Neurological:       Right sided deficit due to stroke  Psychiatric/Behavioral: Positive for substance abuse.   Blood pressure (!) 148/58, pulse 80, temperature 99.1 F (37.3 C), temperature source Oral, resp. rate 20, height 5\' 6"  (1.676 m), weight (!) 144.2 kg (318 lb), SpO2 94 %. Physical Exam  Constitutional:  Obese white female Older than stated age   Cardiovascular: Normal rate and regular rhythm.  Pulmonary/Chest: No respiratory distress.  Musculoskeletal:  Right Lower Extremity  Moderate swelling to R ankle Ext warm + DP pulse + equinovarus  position of foot  No DCT Healed TKA incision  Ex fix stable pinsites look ok      Assessment/Plan:  52 y/o female with multiple medical comorbid conditions with R trimalleolar ankle fracture dislocation s/p ex fix   -R trimall ankle fracture dislocation s/p ex fix  OR for perc pinning vs screw fixation   NWB x 8 weeks post op   May consider achilles lengthening to improve overall function  outpt procedure  Risks and benefits reviewed, pt wishes to proceed   - DVT/PE prophylaxis  Lovenox x 28 days post op   - Impediments to fracture healing:  COPD  Nicotine use  Marijuana use   Malnutrition   - Dispo:  OR for removal of ex fix and fixation of R trimalleolar ankle fracture     Jari Pigg, PA-C [facility] [phone] (C) [phone] (O) 08/18/2017, 8:20 AM

## 2017-08-18 NOTE — Progress Notes (Signed)
DC instructions reviewed with patient and husband at bedside, paper prescription for cipro given to husband, DC IV per order and protocol, DC instructions sent home with husband, no questions or concerns from patient or husband.  Rowe Pavy, RN

## 2017-08-18 NOTE — Progress Notes (Signed)
Inpatient Diabetes Program Recommendations  AACE/ADA: New Consensus Statement on Inpatient Glycemic Control (2015)  Target Ranges:  Prepandial:   less than 140 mg/dL      Peak postprandial:   less than 180 mg/dL (1-2 hours)      Critically ill patients:  140 - 180 mg/dL   Results for DAMIRA, KEM (MRN [id number]) as of 08/18/2017 14:26  Ref. Range 08/18/2017 07:02 08/18/2017 08:18 08/18/2017 09:27 08/18/2017 10:23  Glucose-Capillary Latest Ref Range: 70 - 99 mg/dL 316 (H) 324 (H)  12 units NOVOLOG  289 (H)  8 units NOVOLOG  203 (H)    Admit: Ankle Fracture  History: DM  Home DM Meds: Actos 45 mg daily       Bydureon 2 mg Qweek        Basaglar 30 units QHS        Tradjenta 5 mg daily  Current Orders: None yet      Patient received 10 mg Decadron X 1 dose.  Surgery this AM.    MD- Please consider the following in-hospital insulin adjustments after surgery:  1. Start Lantus 20 units QHS (70% total home dose)-- Please start tonight  2. Please start Novolog Moderate Correction Scale/ SSI (0-15 units) TID AC + HS     --Will follow patient during hospitalization--  Wyn Quaker RN, MSN, CDE Diabetes Coordinator Inpatient Glycemic Control Team Team Pager: [phone] (8a-5p)

## 2017-08-18 NOTE — Brief Op Note (Signed)
08/18/2017  2:22 PM  PATIENT:  [name]  52 y.o. female  PRE-OPERATIVE DIAGNOSIS:   1. RIGHT ANKLE PILON FRACTURE, TIBIA AND FIBULA 2. CHRONIC EQUINOVARUS RIGHT ANKLE CONTRACTURE S/P STROKE 3. EXTERNAL FIXATOR WITH ULCERATED PIN SITES  POST-OPERATIVE DIAGNOSIS:   1. RIGHT ANKLE PILON FRACTURE, TIBIA AND FIBULA 2. CHRONIC EQUINOVARUS RIGHT ANKLE CONTRACTURE S/P STROKE 3. EXTERNAL FIXATOR WITH ULCERATED PIN SITES  PROCEDURE:  Procedure(s): 1. OPEN REDUCTION INTERNAL FIXATION (ORIF) RIGHT PILON FRACTURE, TIBIA ONLY 2. REMOVAL EXTERNAL FIXATION LEG (Right) 3. ACHILLES TENDON LENGTHENING (Right) 4. CURETTAGE AND DEBRIDEMENT OF ULCERATED PIN SITES TIBIA AND CALCANEUS  SURGEON:  Surgeon(s) and Role:    * Altamese [place], MD - Primary  PHYSICIAN ASSISTANT: Ainsley Spinner, PA-C  ANESTHESIA:   general  EBL:  MINIMAL  BLOOD ADMINISTERED:none  DRAINS: none   LOCAL MEDICATIONS USED:  NONE  SPECIMEN:  No Specimen  DISPOSITION OF SPECIMEN:  N/A  COUNTS:  YES  TOURNIQUET:  * No tourniquets in log *  DICTATION: .Other Dictation: Dictation Number [id number]  PLAN OF CARE: Discharge to home after PACU  PATIENT DISPOSITION:  PACU - hemodynamically stable.   Delay start of Pharmacological VTE agent (>24hrs) due to surgical blood loss or risk of bleeding: no

## 2017-08-18 NOTE — Anesthesia Preprocedure Evaluation (Addendum)
Anesthesia Evaluation  Patient identified by MRN, date of birth, ID band Patient awake    Reviewed: Allergy & Precautions, NPO status , Patient's Chart, lab work & pertinent test results  History of Anesthesia Complications (+) history of anesthetic complications  Airway Mallampati: II  TM Distance: >3 FB Neck ROM: Full    Dental  (+) Edentulous Upper, Edentulous Lower   Pulmonary sleep apnea and Continuous Positive Airway Pressure Ventilation , pneumonia, resolved, COPD,  COPD inhaler, Current Smoker,    Pulmonary exam normal breath sounds clear to auscultation       Cardiovascular hypertension, Pt. on medications Normal cardiovascular exam+ dysrhythmias  Rhythm:Regular Rate:Normal     Neuro/Psych  Headaches, PSYCHIATRIC DISORDERS Anxiety TIACVA, No Residual Symptoms    GI/Hepatic Neg liver ROS, GERD  Medicated and Controlled,  Endo/Other  diabetes, Poorly Controlled, Type 2, Insulin Dependent, Oral Hypoglycemic AgentsMorbid obesityHyperlipidemia  Renal/GU negative Renal ROS  negative genitourinary   Musculoskeletal   Abdominal (+) + obese,   Peds  Hematology   Anesthesia Other Findings   Reproductive/Obstetrics                            Anesthesia Physical Anesthesia Plan  ASA: III  Anesthesia Plan: General   Post-op Pain Management:    Induction: Intravenous and Cricoid pressure planned  PONV Risk Score and Plan: 3 and Midazolam, Ondansetron, Treatment may vary due to age or medical condition and Scopolamine patch - Pre-op  Airway Management Planned: Oral ETT  Additional Equipment:   Intra-op Plan:   Post-operative Plan: Extubation in OR  Informed Consent: I have reviewed the patients History and Physical, chart, labs and discussed the procedure including the risks, benefits and alternatives for the proposed anesthesia with the patient or authorized representative who has  indicated his/her understanding and acceptance.   Dental advisory given  Plan Discussed with: CRNA and Surgeon  Anesthesia Plan Comments:         Anesthesia Quick Evaluation

## 2017-08-19 ENCOUNTER — Encounter (HOSPITAL_COMMUNITY): Payer: Self-pay | Admitting: Orthopedic Surgery

## 2017-08-19 LAB — CALCITRIOL (1,25 DI-OH VIT D): VIT D 1 25 DIHYDROXY: 81.1 pg/mL — AB (ref 19.9–79.3)

## 2017-08-19 LAB — VITAMIN D 25 HYDROXY (VIT D DEFICIENCY, FRACTURES): VIT D 25 HYDROXY: 20 ng/mL — AB (ref 30.0–100.0)

## 2017-08-27 NOTE — Op Note (Signed)
NAME: [name], [name]. MEDICAL RECORD TL:[id number] ACCOUNT [id number] DATE OF BIRTH:09/26/1965 FACILITY: [facility] LOCATION: [facility] PHYSICIAN:Pearly Bartosik H. Vipul Cafarelli, MD  OPERATIVE REPORT  DATE OF PROCEDURE:  08/18/2017  PREOPERATIVE DIAGNOSES: 1.  Right ankle pilon fracture, tibia and fibula. 2.  Chronic equinovarus contracture of the right ankle, status post stroke. 3.  Retained external fixator with ulcerated pin sites.  POSTOPERATIVE DIAGNOSES: 1.  Right ankle pilon fracture, tibia and fibula. 2.  Chronic equinovarus contracture of the right ankle, status post stroke. 3.  Retained external fixator with ulcerated pin sites.  PROCEDURES: 1.  Open reduction internal fixation of right tibial pilon fracture, tibia only. 2.  Removal of external fixator under anesthesia. 3.  Achilles tendon lengthening. 4.  Curettage and debridement of ulcerative pin sites, tibia and calcaneus.  SURGEON:  Altamese Houghton, MD  ASSISTANT:  Ainsley Spinner PA-C  ANESTHESIA:  General.  SPECIMENS:  None.  DISPOSITION:  To PACU.  CONDITION:  Stable.  BRIEF INDICATIONS FOR PROCEDURE:  The patient is a 52 year old female with multiple medical problems including cerebrovascular disease and diabetes.  She sustained a pilon fracture dislocation involving the tibia and fibula and was treated with a  spanning external fixator.  The pin sites were in close proximity to her total knee tibial stem, and as ulceration progressed, these did present risk for a deep infection that could jeopardize the total knee implant.  Furthermore, the patient had  progressive worsening of her equinovarus contracture that was present from a prior stroke.  I discussed with her the risks and benefits of surgical repair, including the very real possibility of deep infection that could result in limb loss, DVT, PE,  recurrence of her contracture, failure to correct her contracture and need for further surgery, among others.  We also discussed  arthritis and loss of motion.  She strongly wished to proceed.  BRIEF SUMMARY OF PROCEDURE:  The patient was taken to the operating room where general anesthesia was induced.  She did receive preoperative antibiotics.  Her lower extremity was prepped and draped in the usual sterile fashion.  No tourniquet was placed  about the thigh.  A chlorhexidine scrub, Betadine scrub, and paint were used and draping performed.   The external fixator was removed, and then aggressive curettage was performed of the pin sites at the tibia and calcaneus to bring the skin edges,  subcutaneous tissue, muscle, fascia and deep bone.  These were irrigated thoroughly with saline.  They were then dressed after application of new drapes and a surgical glove for the team.  On an editorial note     I began by retaining the external fixator and performing a closed reduction maneuver. while my assistant held the fixator and leg proximally, I applied maximal distraction and derotation in order to bring the fracture fragment out to length.  This was  combined with some extension and derotation as well as abduction of the foot and provided outstanding realignment of the fracture.  I then made a medial incision and took the medial plate from [employer] and was able to slide it subcutaneously along the  tibial pilon fracture.  I first applied a standard fixation to maximally oppose the plate to the bone and buttress the articular segment of the fracture.  This was followed by additional fixation using standard screws and lastly by locked fixation,  making sure to gain purchase back into the posterior malleolus where most of her fracture displacement had been.  This appeared to reproduce anatomic reconstruction  at the articular surface.  The fibula remained nondisplaced, and so no separate fixation  was placed there.  I then was able to proceed with removal of the external fixator as outlined below.    I then turned my attention to the  Achilles and marked 3 longitudinal cuts on each side of the tendon doing hemi-tendon transverse cuts spaced approximately 2 cm apart and then gently applied pressure and was able to lengthen the Achilles tendon and bring  the foot up into a plantigrade anatomic position at the ankle.  My assistant then irrigated the wound while I held the leg aloft.  He performed suture repair, and then we worked together to dress and close the remaining surgical wounds.  A posterior and  stirrup splint were applied with the patient's foot in maximal extension.  She was taken to the PACU in stable condition.  Again, Ainsley Spinner, PA-C, did assist me throughout.  An assistant was necessary for this technically demanding case.  PROGNOSIS:  The patient will be nonweightbearing with ice and elevation.  We will plan to see her back for removal of her splint in 2 weeks.  She remains at elevated risk for infection and arthritis as well as recurrent loss of motion or contracture  because of her underlying diabetes and prior stroke.  LN/NUANCE  D:08/27/2017 T:08/27/2017 JOB:[id number]

## 2017-09-23 ENCOUNTER — Ambulatory Visit (INDEPENDENT_AMBULATORY_CARE_PROVIDER_SITE_OTHER): Payer: Medicare Other | Admitting: Emergency Medicine

## 2017-09-23 ENCOUNTER — Other Ambulatory Visit (INDEPENDENT_AMBULATORY_CARE_PROVIDER_SITE_OTHER): Payer: Medicare Other

## 2017-09-23 ENCOUNTER — Encounter: Payer: Self-pay | Admitting: Emergency Medicine

## 2017-09-23 VITALS — BP 130/82 | HR 86 | Ht 66.0 in | Wt 309.0 lb

## 2017-09-23 DIAGNOSIS — G43611 Persistent migraine aura with cerebral infarction, intractable, with status migrainosus: Secondary | ICD-10-CM | POA: Diagnosis not present

## 2017-09-23 DIAGNOSIS — J449 Chronic obstructive pulmonary disease, unspecified: Secondary | ICD-10-CM

## 2017-09-23 DIAGNOSIS — R918 Other nonspecific abnormal finding of lung field: Secondary | ICD-10-CM

## 2017-09-23 DIAGNOSIS — I639 Cerebral infarction, unspecified: Secondary | ICD-10-CM

## 2017-09-23 DIAGNOSIS — Z720 Tobacco use: Secondary | ICD-10-CM

## 2017-09-23 DIAGNOSIS — G4734 Idiopathic sleep related nonobstructive alveolar hypoventilation: Secondary | ICD-10-CM | POA: Diagnosis not present

## 2017-09-23 DIAGNOSIS — J9611 Chronic respiratory failure with hypoxia: Secondary | ICD-10-CM | POA: Insufficient documentation

## 2017-09-23 LAB — BASIC METABOLIC PANEL
BUN: 11 mg/dL (ref 6–23)
CALCIUM: 9.2 mg/dL (ref 8.4–10.5)
CO2: 27 mEq/L (ref 19–32)
CREATININE: 0.71 mg/dL (ref 0.40–1.20)
Chloride: 98 mEq/L (ref 96–112)
GFR: 91.12 mL/min (ref >60.00)
GLUCOSE: 306 mg/dL — AB (ref 70–99)
Potassium: 4 mEq/L (ref 3.5–5.1)
Sodium: 133 mEq/L — ABNORMAL LOW (ref 135–145)

## 2017-09-23 MED ORDER — TIOTROPIUM BROMIDE-OLODATEROL 2.5-2.5 MCG/ACT IN AERS: 2.0000 | INHALATION_SPRAY | Freq: Every day | RESPIRATORY_TRACT | 0 refills | Status: DC

## 2017-09-23 NOTE — Patient Instructions (Addendum)
We will perform full pulmonary function testing. Please start Stiolto 2 puffs once daily.  If you benefit from this medication we will plan to continue it and order it through your pharmacy. Keep albuterol available to use 2 puffs up to every 4 hours as needed for shortness of breath, chest tightness, wheezing. Please start using oxygen at night 2 L/min. We will arrange for an overnight oximetry on 2 L/min to confirm that this flow rate is adequate. We will order a CT scan of your chest with contrast to evaluate your pulmonary nodules. Please start working on decreasing your smoking.  We set a goal today to cut down to 1 pack daily.  We will discuss further next visit. Follow with [name] in 1 month or next available to review your testing.

## 2017-09-23 NOTE — Assessment & Plan Note (Signed)
Documented on polysomnogram.  She already has home oxygen so I do not need to confirm to order.  Start 2 L/min.  We will perform oximetry on 2 L to confirm this is adequate in a couple weeks.

## 2017-09-23 NOTE — Assessment & Plan Note (Signed)
Found spuriously on a CT scan of the abdomen.  Her most recent CT chest was done in June 2017 without any follow-up.  She needs a repeat CT scan of the chest now.  We will do this with contrast and compare to her prior.

## 2017-09-23 NOTE — Assessment & Plan Note (Signed)
Multifactorial, due to her COPD, obesity hypoventilation.  Documented desaturations at night.  Unclear whether she will desaturate with exertion.  At this time she has a broken right ankle and cannot ambulate for me.  We will check this again later when she is mobile, start oxygen with exertion if indicated.

## 2017-09-23 NOTE — Assessment & Plan Note (Signed)
Presumed COPD, severity unclear.  She has had exacerbations in the past.  She is not on scheduled bronchodilators at this time.  Would like to do a trial of Stiolto to see if she benefits.  We will also check pulmonary function testing to assess degree of obstruction.

## 2017-09-23 NOTE — Assessment & Plan Note (Signed)
Discussed cessation with her in detail today.  We talked about strategies to cut down and agreed that she would get down to 1 pack/day as her next goal.

## 2017-09-23 NOTE — Progress Notes (Signed)
Subjective:    Patient ID: [name], female    DOB: 1965/02/11, 52 y.o.   MRN: [id number]  HPI  52 year old obese woman, smoker (64 pk-yrs) with a history of diabetes, hypertension, GERD, CVA, migraine headaches, recent orthopedic surgery for R ankle fracture.  She was evaluated by sleep study 08/07/2017.  This did not show any definitive obstructive sleep apnea but did confirm nocturnal hypoxemia. She already has an O2 concentrator because she has desats in the past when she was ill. Smokes about 1.5 pk/day currently.   She has pulmonary nodules noted on CT chest from 07/13/15 >> multiple pulmonary nodules. No follow up has been done.   She has albuterol to ues prn, uses at least 3x a day.   TTE 04/08/17 >> no evidence PAH     Review of Systems  Constitutional: Negative for fever and unexpected weight change.  HENT: Positive for congestion and postnasal drip. Negative for dental problem, ear pain, nosebleeds, rhinorrhea, sinus pressure, sneezing, sore throat and trouble swallowing.   Eyes: Negative for redness and itching.  Respiratory: Positive for shortness of breath and wheezing. Negative for cough and chest tightness.   Cardiovascular: Negative for palpitations.  Gastrointestinal: Positive for nausea and vomiting.  Genitourinary: Negative for dysuria.  Musculoskeletal: Negative for joint swelling.  Skin: Negative for rash.  Allergic/Immunologic: Negative.  Negative for environmental allergies, food allergies and immunocompromised state.  Neurological: Positive for headaches.  Hematological: Bruises/bleeds easily.  Psychiatric/Behavioral: Negative for dysphoric mood. The patient is nervous/anxious.    Past Medical History:  Diagnosis Date  . Anxiety   . Cancer ([place])    a spot on liver and treated   . Complication of anesthesia    restless,easily upset  . COPD (chronic obstructive pulmonary disease) ([place])   . Diabetes mellitus without complication ([place])   .  Diverticulitis   . Fatty liver   . GERD (gastroesophageal reflux disease)   . Headache(784.0)    migraines  . History of kidney stones   . Hypertension   . Pneumonia   . Restless   . Stroke [facility])      Family History  Problem Relation Age of Onset  . Diabetes Mother   . Hypertension Mother   . Hypertension Father      Social History   Socioeconomic History  . Marital status: Married    Spouse name: Not on file  . Number of children: Not on file  . Years of education: Not on file  . Highest education level: Not on file  Occupational History  . Not on file  Social Needs  . Financial resource strain: Not on file  . Food insecurity:    Worry: Not on file    Inability: Not on file  . Transportation needs:    Medical: Not on file    Non-medical: Not on file  Tobacco Use  . Smoking status: Current Every Day Smoker    Packs/day: 1.50    Years: 20.00    Pack years: 30.00    Types: Cigarettes  . Smokeless tobacco: Never Used  Substance and Sexual Activity  . Alcohol use: No  . Drug use: Yes    Types: Marijuana    Comment: last smoked 2 days ago  8/4  . Sexual activity: Not on file  Lifestyle  . Physical activity:    Days per week: Not on file    Minutes per session: Not on file  . Stress: Not on file  Relationships  .  Social connections:    Talks on phone: Not on file    Gets together: Not on file    Attends religious service: Not on file    Active member of club or organization: Not on file    Attends meetings of clubs or organizations: Not on file    Relationship status: Not on file  . Intimate partner violence:    Fear of current or ex partner: Not on file    Emotionally abused: Not on file    Physically abused: Not on file    Forced sexual activity: Not on file  Other Topics Concern  . Not on file  Social History Narrative  . Not on file  has worked Charity fundraiser, Administrator, sports, communications.  Lived in [place].  No inhaled exposures.   Allergies  Allergen  Reactions  . Tramadol Nausea And Vomiting and Hypertension  . Augmentin [Amoxicillin-Pot Clavulanate] Rash     Outpatient Medications Prior to Visit  Medication Sig Dispense Refill  . acetaminophen (TYLENOL) 650 MG suppository Place 1 suppository (650 mg total) rectally every 6 (six) hours as needed for mild pain (or Fever >/= 101). 12 suppository 0  . ALPRAZolam (XANAX) 1 MG tablet Take 1 mg by mouth 3 (three) times daily as needed for anxiety.     Marland Kitchen BYDUREON BCISE 2 MG/0.85ML AUIJ Inject 2 mg into the skin once a week. On Thursday    . citalopram (CELEXA) 40 MG tablet Take 40 mg by mouth daily.    . clopidogrel (PLAVIX) 75 MG tablet Take 75 mg by mouth daily.    Marland Kitchen dicyclomine (BENTYL) 20 MG tablet Take 20 mg by mouth 2 (two) times daily.    Eduard Roux (AIMOVIG) 70 MG/ML SOAJ Inject 70 mg into the skin every 30 (thirty) days.     . furosemide (LASIX) 40 MG tablet Take 40 mg by mouth daily.     . Insulin Glargine (BASAGLAR KWIKPEN) 100 UNIT/ML SOPN Inject 30 Units into the skin at bedtime.     Marland Kitchen linagliptin (TRADJENTA) 5 MG TABS tablet Take 5 mg by mouth daily.    . mupirocin ointment (BACTROBAN) 2 % Place 1 application into the nose 2 (two) times daily. 22 g 0  . nystatin (MYCOSTATIN/NYSTOP) powder Apply 1 g topically 2 (two) times daily.     . Oxycodone HCl 10 MG TABS Take 1 tablet (10 mg total) by mouth every 4 (four) hours as needed (pain). 30 tablet 0  . OXYCONTIN 30 MG 12 hr tablet Take 30 mg by mouth 2 (two) times daily.    . pantoprazole (PROTONIX) 40 MG tablet Take 40 mg by mouth daily.     . pioglitazone (ACTOS) 45 MG tablet Take 45 mg by mouth daily.    . rizatriptan (MAXALT-MLT) 10 MG disintegrating tablet Take 10 mg by mouth as needed for migraine. May repeat in 2 hours if needed    . simvastatin (ZOCOR) 40 MG tablet Take 1 tablet (40 mg total) by mouth every evening. 30 tablet 0  . topiramate (TOPAMAX) 100 MG tablet Take 1 tablet (100 mg total) by mouth 2 (two) times  daily. 180 tablet 4  . VENTOLIN HFA 108 (90 Base) MCG/ACT inhaler Inhale 1-2 puffs into the lungs every 6 (six) hours as needed for wheezing or shortness of breath.     . Vitamin D, Ergocalciferol, (DRISDOL) 50000 units CAPS capsule Take 50,000 Units by mouth every 7 (seven) days. On Wednesday    . WELCHOL 625  MG tablet Take 1,875 mg by mouth 2 (two) times daily.     Marland Kitchen gabapentin (NEURONTIN) 600 MG tablet Take 1 tablet (600 mg total) by mouth 4 (four) times daily. 360 tablet 3   No facility-administered medications prior to visit.          Objective:   Physical Exam Vitals:   09/23/17 1517  BP: 130/82  Pulse: 86  SpO2: 96%  Weight: (!) 309 lb (140.2 kg)  Height: 5\' 6"  (1.676 m)   Gen: Pleasant, obese woman, in no distress,  normal affect  ENT: No lesions,  mouth clear,  oropharynx clear, no postnasal drip  Neck: No JVD, no stridor  Lungs: No use of accessory muscles, coarse B, decreased at bases, some wheeze on forced exp  Cardiovascular: RRR, heart sounds normal, no murmur or gallops, no peripheral edema  Musculoskeletal: No deformities, no cyanosis or clubbing  Neuro: alert, non focal  Skin: Warm, no lesions or rash      Assessment & Plan:  Nocturnal hypoxemia Documented on polysomnogram.  She already has home oxygen so I do not need to confirm to order.  Start 2 L/min.  We will perform oximetry on 2 L to confirm this is adequate in a couple weeks.  COPD (chronic obstructive pulmonary disease) ([place]) Presumed COPD, severity unclear.  She has had exacerbations in the past.  She is not on scheduled bronchodilators at this time.  Would like to do a trial of Stiolto to see if she benefits.  We will also check pulmonary function testing to assess degree of obstruction.  Tobacco abuse Discussed cessation with her in detail today.  We talked about strategies to cut down and agreed that she would get down to 1 pack/day as her next goal.  Chronic hypoxemic respiratory failure  ([facility]) Multifactorial, due to her COPD, obesity hypoventilation.  Documented desaturations at night.  Unclear whether she will desaturate with exertion.  At this time she has a broken right ankle and cannot ambulate for me.  We will check this again later when she is mobile, start oxygen with exertion if indicated.  Pulmonary nodules Found spuriously on a CT scan of the abdomen.  Her most recent CT chest was done in June 2017 without any follow-up.  She needs a repeat CT scan of the chest now.  We will do this with contrast and compare to her prior.  Baltazar Apo, MD, PhD 09/23/2017, 3:59 PM [facility] [phone] or if no answer [phone]

## 2017-10-01 ENCOUNTER — Ambulatory Visit: Admission: RE | Admit: 2017-10-01 | Payer: Medicare Other | Source: Ambulatory Visit

## 2017-10-09 ENCOUNTER — Ambulatory Visit
Admission: RE | Admit: 2017-10-09 | Discharge: 2017-10-09 | Disposition: A | Discharge status: Home / Self Care | Payer: Medicare Other | Source: Ambulatory Visit | Attending: Emergency Medicine | Admitting: Emergency Medicine

## 2017-10-09 DIAGNOSIS — J439 Emphysema, unspecified: Secondary | ICD-10-CM | POA: Diagnosis not present

## 2017-10-09 DIAGNOSIS — R918 Other nonspecific abnormal finding of lung field: Secondary | ICD-10-CM

## 2017-10-09 DIAGNOSIS — I2510 Atherosclerotic heart disease of native coronary artery without angina pectoris: Secondary | ICD-10-CM | POA: Insufficient documentation

## 2017-10-09 MED ORDER — IOPAMIDOL (ISOVUE-300) INJECTION 61%
75.0000 mL | Freq: Once | INTRAVENOUS | Status: AC | PRN
  Administered 2017-10-09: 75 mL via INTRAVENOUS

## 2017-10-27 ENCOUNTER — Telehealth: Payer: Self-pay | Admitting: Emergency Medicine

## 2017-10-27 NOTE — Telephone Encounter (Signed)
Spoke with pt's family member, he advised he would let pt know and have her call us back. Will await return call.

## 2017-10-27 NOTE — Telephone Encounter (Signed)
Please let her know that her Ct scan shows that her pulmonary nodules are still present, are all small. They have not significantly changed compared with report from her last scan in 2017. This is good news. Most important thing for her to do is STOP SMOKING. She will need a repeat CT chest in 1 year to insure stability.

## 2017-10-27 NOTE — Telephone Encounter (Signed)
CT scan performed 9/27 which I am not seeing it resulted yet by Dr. Lamonte Sakai.  Called and spoke with pt letting her know that once RB reviews the results, we would let her know the results.  Pt stated she was supposed to see RB tomorrow, 10/16 for a follow up but pt stated she rescheduled the OV due to her thinking she has the flu.  Pt is not scheduled to come back for a follow up with RB until November 19 with a PFT prior.  Dr.Byrum, please advise on the results of CT for pt. Thanks!

## 2017-10-28 ENCOUNTER — Ambulatory Visit: Payer: Medicare Other | Admitting: Emergency Medicine

## 2017-10-28 NOTE — Telephone Encounter (Signed)
Called and spoke with husband Sonia Side, (on [place]), informed of results. Voiced understanding. Nothing further is needed at this time.

## 2017-10-29 ENCOUNTER — Encounter: Payer: Self-pay | Admitting: Emergency Medicine

## 2017-12-01 ENCOUNTER — Ambulatory Visit: Payer: Medicare Other | Admitting: Emergency Medicine

## 2018-05-25 ENCOUNTER — Other Ambulatory Visit: Payer: Self-pay | Admitting: Neurosurgery

## 2018-05-25 DIAGNOSIS — G8929 Other chronic pain: Secondary | ICD-10-CM

## 2018-05-25 DIAGNOSIS — M5441 Lumbago with sciatica, right side: Secondary | ICD-10-CM

## 2018-05-31 ENCOUNTER — Other Ambulatory Visit: Payer: Self-pay | Admitting: Neurosurgery

## 2018-05-31 DIAGNOSIS — M5441 Lumbago with sciatica, right side: Secondary | ICD-10-CM

## 2018-05-31 DIAGNOSIS — G8929 Other chronic pain: Secondary | ICD-10-CM

## 2018-06-03 ENCOUNTER — Ambulatory Visit: Payer: No Typology Code available for payment source

## 2018-06-19 ENCOUNTER — Other Ambulatory Visit: Payer: Medicare Other

## 2018-07-14 ENCOUNTER — Ambulatory Visit
Admission: RE | Admit: 2018-07-14 | Discharge: 2018-07-14 | Disposition: A | Discharge status: Home / Self Care | Payer: Medicare Other | Source: Ambulatory Visit | Attending: Neurosurgery | Admitting: Neurosurgery

## 2018-07-14 DIAGNOSIS — G8929 Other chronic pain: Secondary | ICD-10-CM

## 2018-07-14 MED ORDER — GADOBENATE DIMEGLUMINE 529 MG/ML IV SOLN
20.0000 mL | Freq: Once | INTRAVENOUS | Status: AC | PRN
  Administered 2018-07-14: 20 mL via INTRAVENOUS

## 2018-07-22 ENCOUNTER — Other Ambulatory Visit: Payer: Self-pay | Admitting: *Deleted

## 2018-07-22 NOTE — Telephone Encounter (Signed)
Received request for refill request for topiramate.  Pt discharged from our care (only prn).  To f/u with pcp for migraines.  Faxed request to pts pcp [phone].

## 2019-03-27 IMAGING — RF DG C-ARM 61-120 MIN
1 series · 6 of 6 positions shown · non-contrast
Comparison: 08/10/2017

CLINICAL DATA: Right ankle fracture

EXAM:
RIGHT ANKLE - COMPLETE 3+ VIEW; DG C-ARM 61-120 MIN

[Series 1: run · 6 of 6 slices shown]
[im 1/6]
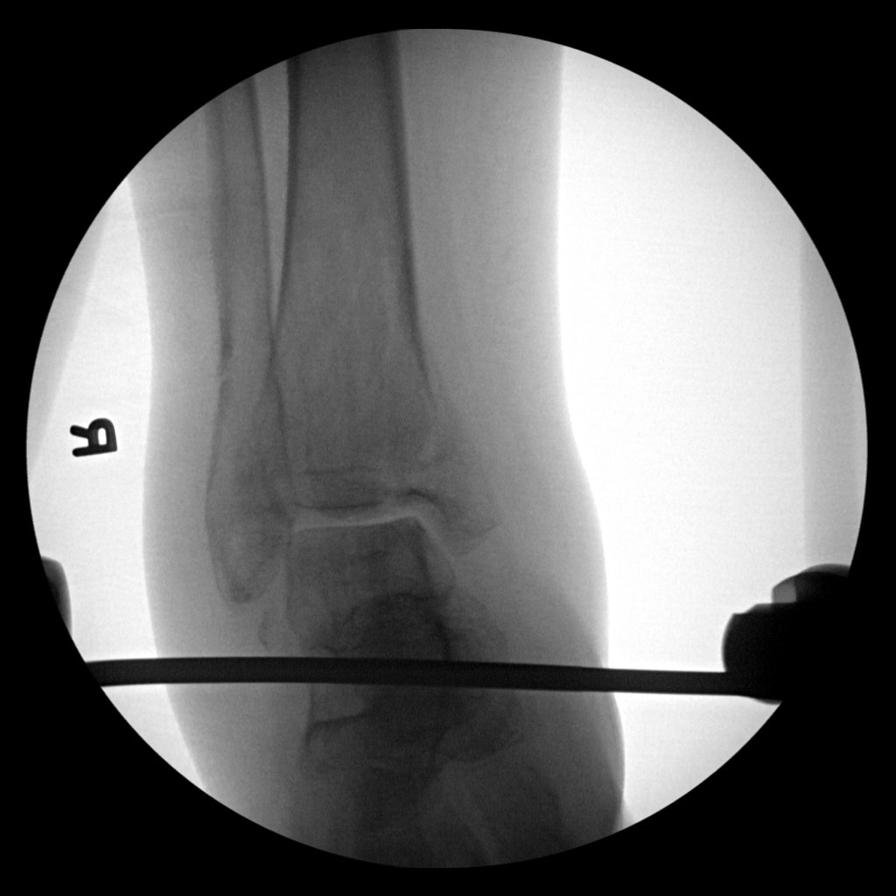
[im 2/6]
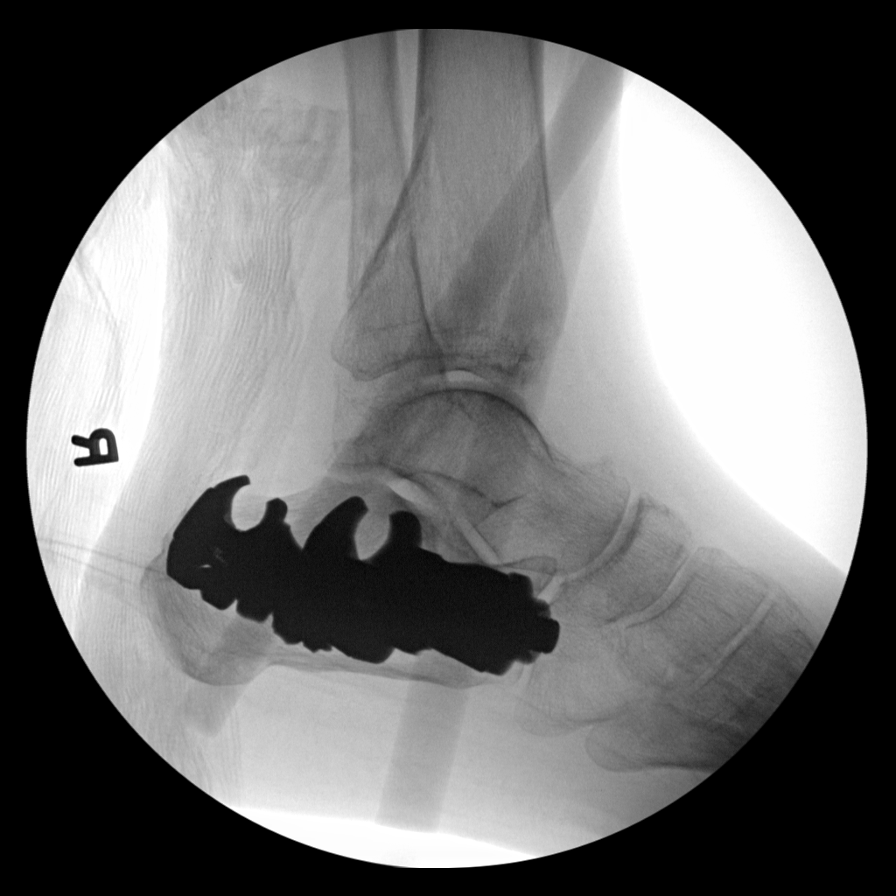
[im 3/6]
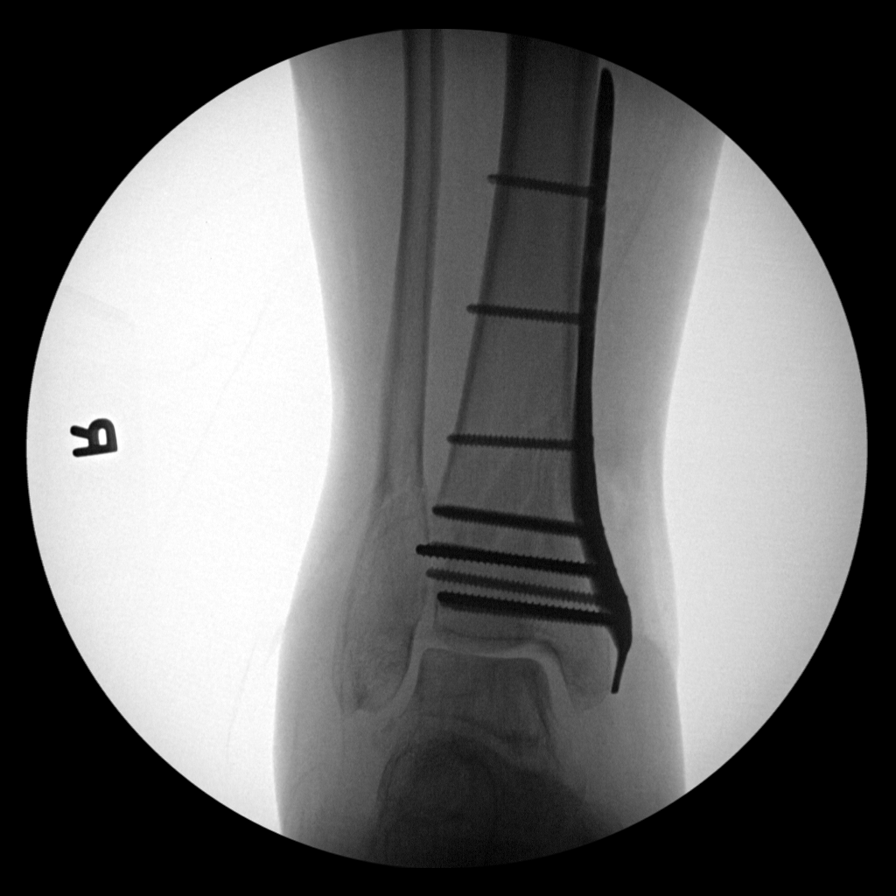
[im 4/6]
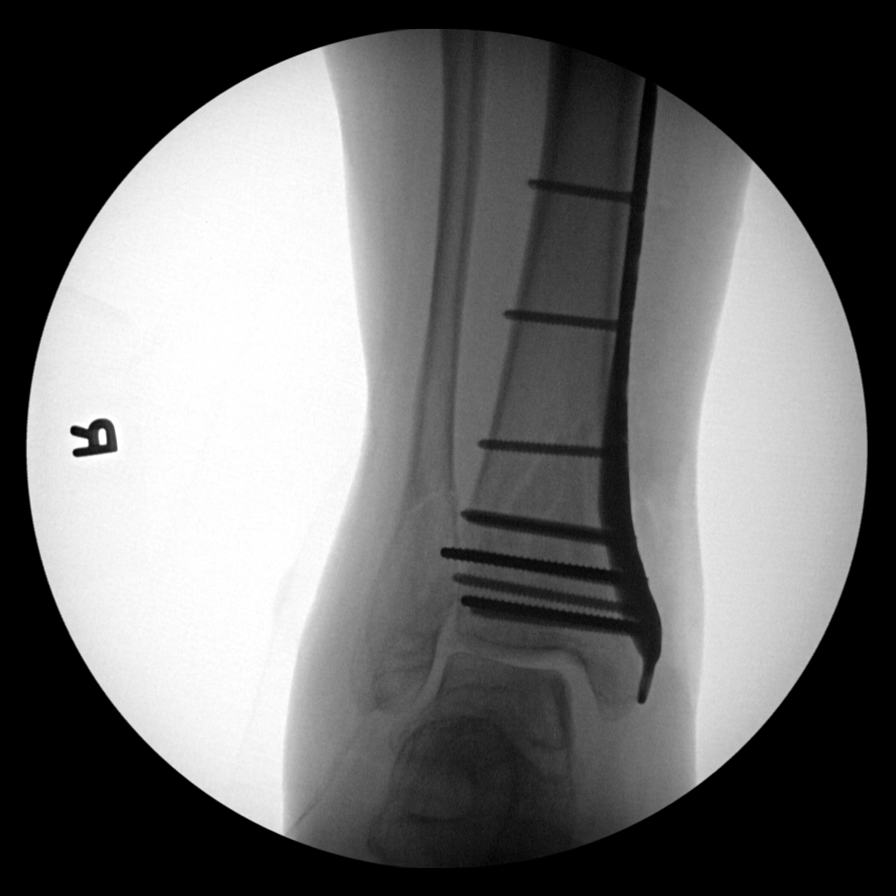
[im 5/6]
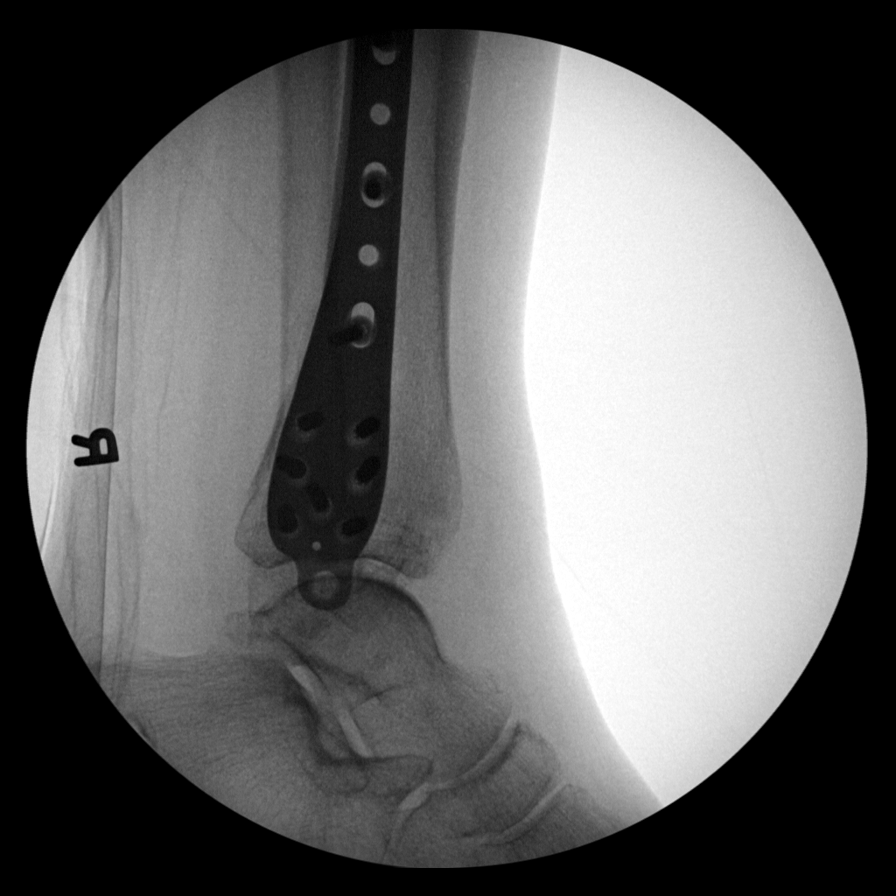
[im 6/6]
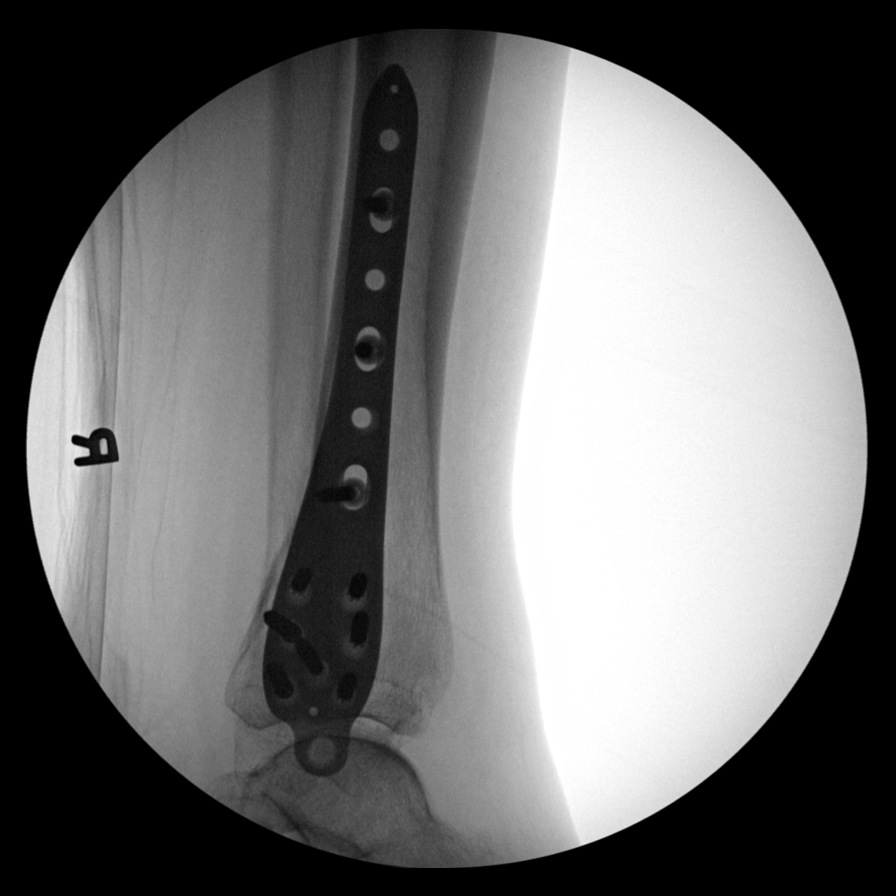

[6 of 6 positions shown; findings below may reference images not displayed]

FLUOROSCOPY TIME:  Fluoroscopy Time:  51 seconds

Radiation Exposure Index (if provided by the fluoroscopic device):
Not available

Number of Acquired Spot Images: 5
FINDINGS: Previously placed external fixator has been removed. The distal
fibular and tibial fractures are again seen. Station sideplate with
multiple fixation screws are noted along the distal tibia. Fracture
fragments are in near anatomic alignment.
IMPRESSION: Status post ORIF of right ankle fracture.

## 2019-03-27 IMAGING — DX DG ANKLE COMPLETE 3+V*R*
3 series · 3 of 3 positions shown · non-contrast
Comparison: Intraoperative images obtained earlier in the day; CT
right ankle August 09, 2017

CLINICAL DATA: Open reduction internal fixation for fractures

EXAM:
RIGHT ANKLE - COMPLETE 3+ VIEW

[ankle obl]
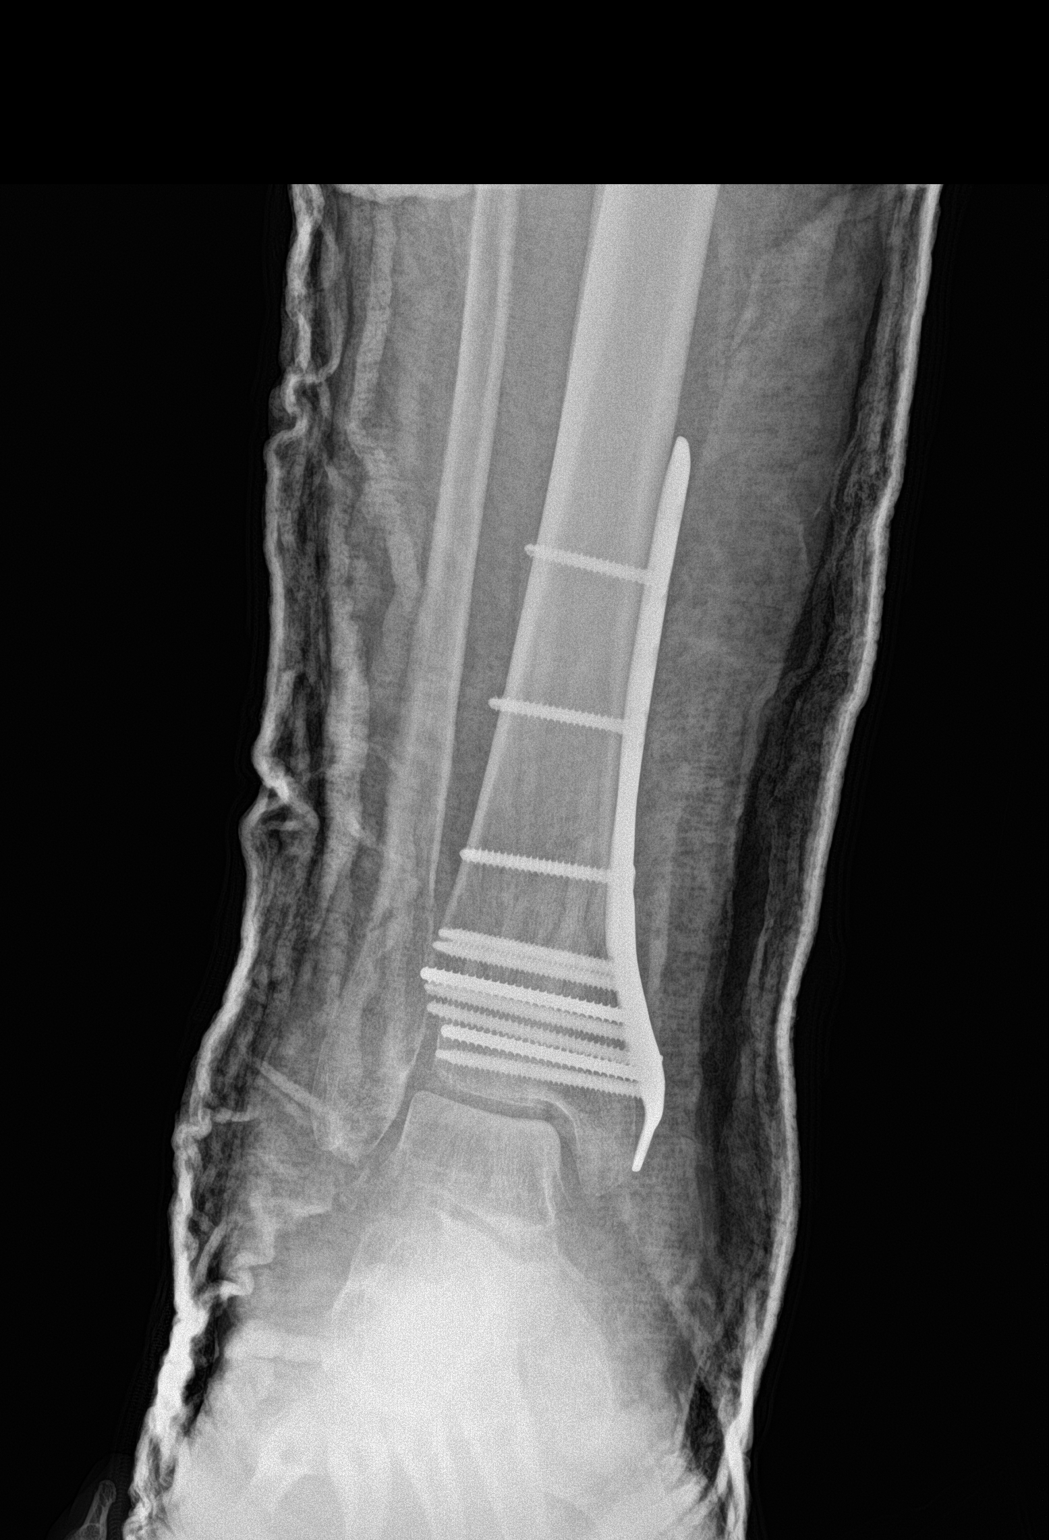

[ankle lat (1 of 2)]
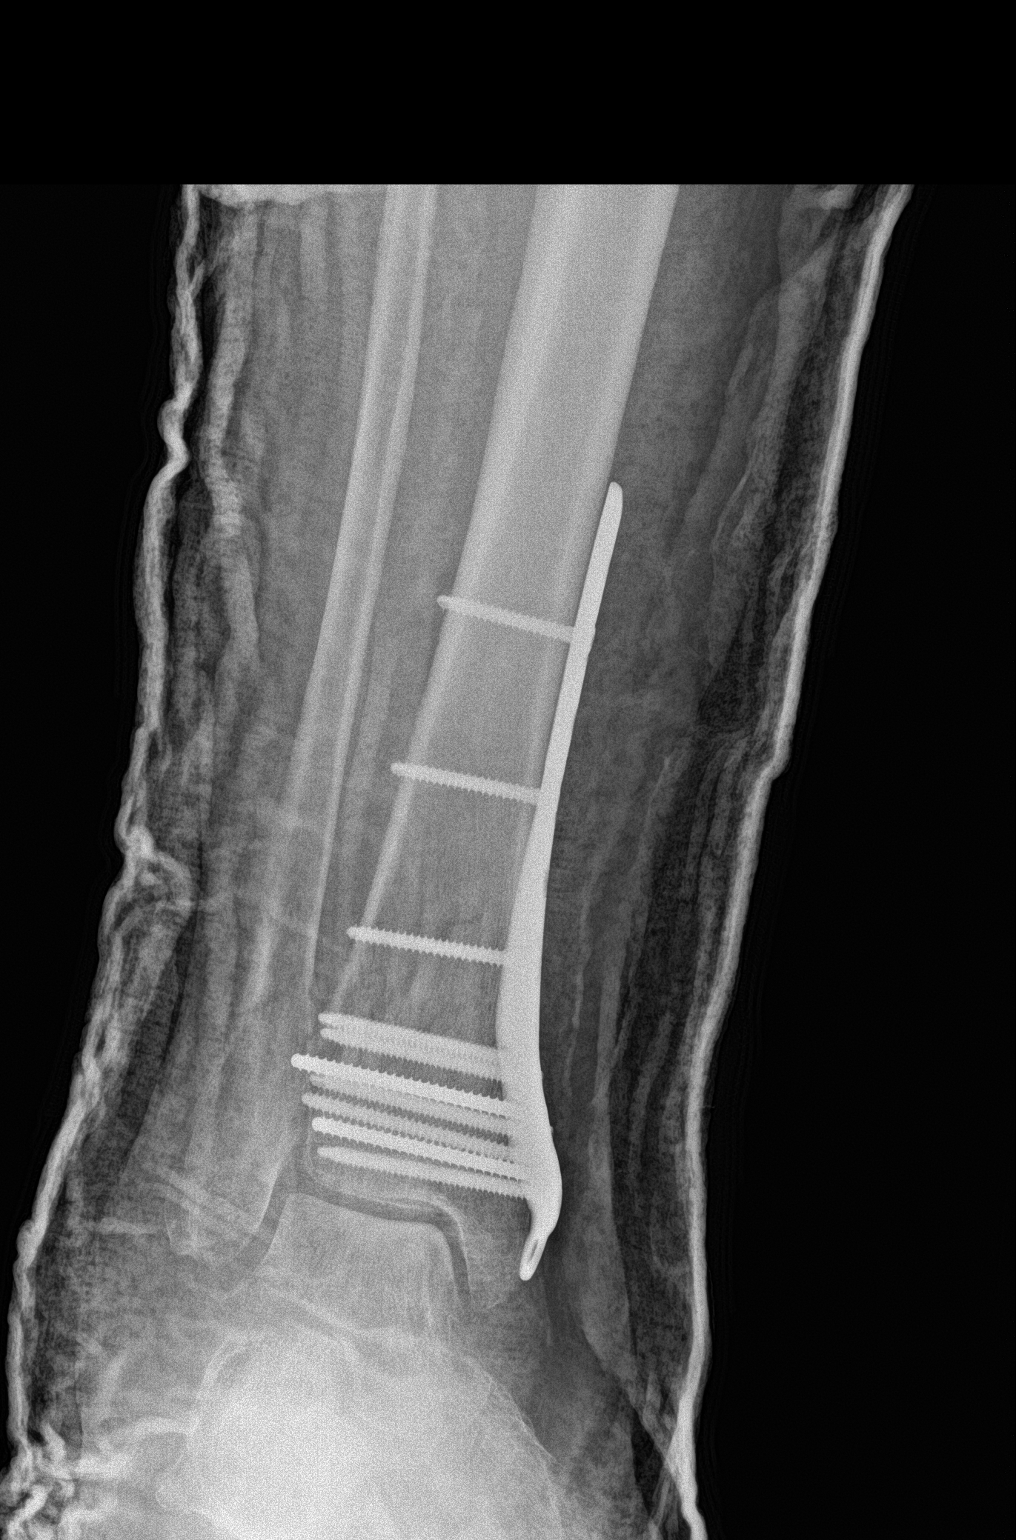

[ankle lat (2 of 2)]
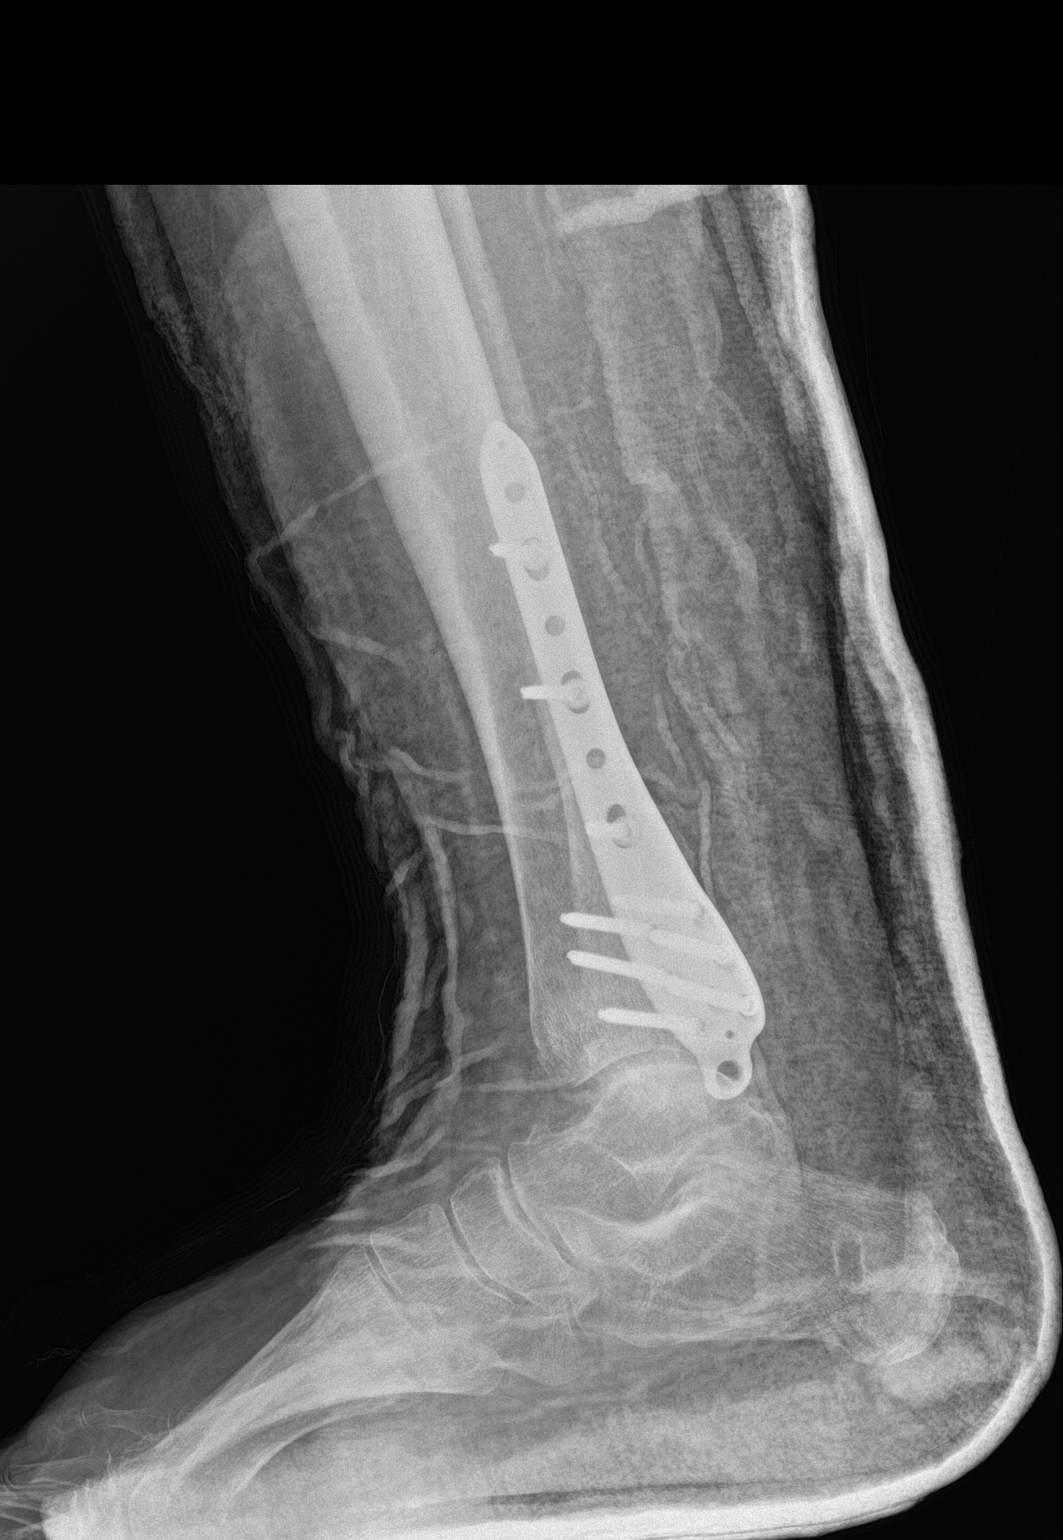

[3 of 3 positions shown; findings below may reference images not displayed]

FINDINGS: Frontal, oblique, and lateral views were obtained with the ankle in
plaster. There is screw and plate fixation through a comminuted
fracture of the distal tibia with fracture fragments in essentially
anatomic alignment. Comminuted fracture distal fibula appears in
near anatomic alignment. Ankle mortise appears intact. No new
fractures are evident.
IMPRESSION: Screw and plate fixation through comminuted fracture distal tibia
with alignment near anatomic. Fracture distal fibula with alignment
near anatomic. Ankle mortise appears intact.

## 2020-05-17 ENCOUNTER — Ambulatory Visit: Payer: Medicare Other | Admitting: *Deleted

## 2020-05-23 DIAGNOSIS — E119 Type 2 diabetes mellitus without complications: Secondary | ICD-10-CM | POA: Diagnosis not present

## 2020-05-23 DIAGNOSIS — Z049 Encounter for examination and observation for unspecified reason: Secondary | ICD-10-CM | POA: Diagnosis not present

## 2020-05-23 DIAGNOSIS — G990 Autonomic neuropathy in diseases classified elsewhere: Secondary | ICD-10-CM | POA: Diagnosis not present

## 2020-05-29 DIAGNOSIS — Z79891 Long term (current) use of opiate analgesic: Secondary | ICD-10-CM | POA: Diagnosis not present

## 2020-05-29 DIAGNOSIS — Z720 Tobacco use: Secondary | ICD-10-CM | POA: Diagnosis not present

## 2020-05-29 DIAGNOSIS — M25561 Pain in right knee: Secondary | ICD-10-CM | POA: Diagnosis not present

## 2020-05-29 DIAGNOSIS — M545 Low back pain, unspecified: Secondary | ICD-10-CM | POA: Diagnosis not present

## 2020-05-29 DIAGNOSIS — I69351 Hemiplegia and hemiparesis following cerebral infarction affecting right dominant side: Secondary | ICD-10-CM | POA: Diagnosis not present

## 2020-05-29 DIAGNOSIS — M961 Postlaminectomy syndrome, not elsewhere classified: Secondary | ICD-10-CM | POA: Diagnosis not present

## 2020-05-29 DIAGNOSIS — G894 Chronic pain syndrome: Secondary | ICD-10-CM | POA: Diagnosis not present

## 2020-06-09 ENCOUNTER — Encounter: Payer: Self-pay | Admitting: Emergency Medicine

## 2020-06-09 ENCOUNTER — Ambulatory Visit
Admission: EM | Admit: 2020-06-09 | Discharge: 2020-06-09 | Disposition: A | Discharge status: Home / Self Care | Payer: Medicare Other | Attending: Sports Medicine | Admitting: Sports Medicine

## 2020-06-09 ENCOUNTER — Other Ambulatory Visit: Payer: Self-pay

## 2020-06-09 DIAGNOSIS — H938X3 Other specified disorders of ear, bilateral: Secondary | ICD-10-CM | POA: Diagnosis not present

## 2020-06-09 DIAGNOSIS — R059 Cough, unspecified: Secondary | ICD-10-CM | POA: Diagnosis not present

## 2020-06-09 DIAGNOSIS — J441 Chronic obstructive pulmonary disease with (acute) exacerbation: Secondary | ICD-10-CM | POA: Insufficient documentation

## 2020-06-09 DIAGNOSIS — Z79899 Other long term (current) drug therapy: Secondary | ICD-10-CM | POA: Diagnosis not present

## 2020-06-09 DIAGNOSIS — R062 Wheezing: Secondary | ICD-10-CM | POA: Diagnosis not present

## 2020-06-09 DIAGNOSIS — Z20822 Contact with and (suspected) exposure to covid-19: Secondary | ICD-10-CM | POA: Insufficient documentation

## 2020-06-09 DIAGNOSIS — R0789 Other chest pain: Secondary | ICD-10-CM | POA: Insufficient documentation

## 2020-06-09 LAB — RESP PANEL BY RT-PCR (FLU A&B, COVID) ARPGX2
Influenza A by PCR: NEGATIVE
Influenza B by PCR: NEGATIVE
SARS Coronavirus 2 by RT PCR: NEGATIVE

## 2020-06-09 MED ORDER — FLUTICASONE PROPIONATE 50 MCG/ACT NA SUSP: 2.0000 | Freq: Every day | NASAL | 0 refills | Status: DC

## 2020-06-09 MED ORDER — AZITHROMYCIN 250 MG PO TABS: 250.0000 mg | ORAL_TABLET | Freq: Every day | ORAL | 0 refills | Status: DC

## 2020-06-09 NOTE — Discharge Instructions (Addendum)
Your respiratory panel showed you are negative for COVID and negative for influenza. Given your history of COPD, your continued smoking, and the wheeze on exam I will treat you for COPD exacerbation.  We will send in an antibiotic, something for your nasal congestion.  I considered sending in a short course of prednisone, however you have diabetes and the last blood sugar I see was 306, so I am not comfortable doing that.  I want to continue with your inhaler and your other medications. If symptoms persist please see your primary care provider. If symptoms worsen please go to the ER. Please see educational handouts.

## 2020-06-09 NOTE — ED Triage Notes (Signed)
Patient c/o cough and chest congestion for a week.  Patient denies fevers.  °

## 2020-06-09 NOTE — ED Provider Notes (Signed)
[facility]    CSN: [id number] Arrival date & time: 06/09/20  1434      History   Chief Complaint Chief Complaint  Patient presents with  . Cough    HPI Deanna MACIOLEK is a 55 y.o. female.   Patient is a 55 year old female who presents for evaluation of the above issues.  Her primary care physician is in [place].  She does not work outside the home.  She is on disability.  She reports 1 week of cough, chest congestion, nasal congestion, ear pressure, chest tightness and some wheeze.  Complicating her situation is she has a diagnosis of COPD and asthma.  She continues to smoke.  She denies any fever shakes chills.  No nausea vomiting or diarrhea.  No chest pain or significant shortness of breath.  She denies any abdominal or urinary symptoms.  She has been vaccinated against COVID and has received the booster.  She is also received her flu shot.  She denies any COVID exposure.  She did have COVID back in August 2021.  No red flag signs or symptoms elicited on history.     Past Medical History:  Diagnosis Date  . Anxiety   . Cancer ([place])    a spot on liver and treated   . Complication of anesthesia    restless,easily upset  . COPD (chronic obstructive pulmonary disease) ([place])   . Diabetes mellitus without complication ([place])   . Diverticulitis   . Fatty liver   . GERD (gastroesophageal reflux disease)   . Headache(784.0)    migraines  . History of kidney stones   . Hypertension   . Pneumonia   . Restless   . Stroke [facility])     Patient Active Problem List   Diagnosis Date Noted  . Chronic hypoxemic respiratory failure ([place]) 09/23/2017  . Pulmonary nodules 09/23/2017  . Trimalleolar fracture 08/09/2017  . TIA (transient ischemic attack) 04/07/2017  . Anxiety 04/07/2017  . History of stroke   . Complicated migraine   . Cerebral infarction ([place]) 10/11/2012  . Brain TIA 10/11/2012  . Hyperlipidemia 10/11/2012  . Nocturnal hypoxemia 10/11/2012  . Diabetes  ([place]) 10/11/2012  . Tobacco abuse 10/11/2012  . COPD (chronic obstructive pulmonary disease) ([place]) 10/11/2012    Past Surgical History:  Procedure Laterality Date  . ABDOMINAL HYSTERECTOMY    . ACHILLES TENDON LENGTHENING Right 08/18/2017   Procedure: ACHILLES TENDON LENGTHENING;  Surgeon: Altamese Cobalt, MD;  Location: [place];  Service: Orthopedics;  Laterality: Right;  . ANTERIOR CERVICAL DECOMP/DISCECTOMY FUSION N/A 03/11/2012   Procedure: ANTERIOR CERVICAL DECOMPRESSION/DISCECTOMY FUSION 1 LEVEL;  Surgeon: Ophelia Charter, MD;  Location: [place] [facility];  Service: Neurosurgery;  Laterality: N/A;  Cervical five-six anterior cervical decompression with fusion interbody prothesis plating and bonegraft  . BACK SURGERY    . EXTERNAL FIXATION LEG Right 08/10/2017   Procedure: EXTERNAL FIXATION ANKLE;  Surgeon: Dereck Leep, MD;  Location: [facility];  Service: Orthopedics;  Laterality: Right;  . EXTERNAL FIXATION REMOVAL Right 08/18/2017   Procedure: REMOVAL EXTERNAL FIXATION LEG;  Surgeon: Altamese Minnehaha, MD;  Location: [place];  Service: Orthopedics;  Laterality: Right;  . EYE SURGERY    . HERNIA REPAIR    . JOINT REPLACEMENT     bil knees  . ORIF ANKLE FRACTURE Right 08/18/2017   Procedure: OPEN REDUCTION INTERNAL FIXATION (ORIF) ANKLE FRACTURE;  Surgeon: Altamese Dorrance, MD;  Location: [place];  Service: Orthopedics;  Laterality: Right;  . REPLACEMENT TOTAL  KNEE BILATERAL      OB History   No obstetric history on file.      Home Medications    Prior to Admission medications   Medication Sig Start Date End Date Taking? Authorizing Provider  atorvastatin (LIPITOR) 40 MG tablet Take by mouth. 05/02/20 05/02/21 Yes [provider]  azithromycin (ZITHROMAX) 250 MG tablet Take 1 tablet (250 mg total) by mouth daily. Take first 2 tablets together, then 1 every day until finished. 06/09/20  Yes Verda Cumins, MD  citalopram (CELEXA) 40 MG tablet Take 40 mg by mouth daily. 03/23/17  Yes  [provider]  clopidogrel (PLAVIX) 75 MG tablet Take 75 mg by mouth daily. 03/18/17  Yes [provider]  dicyclomine (BENTYL) 20 MG tablet Take 20 mg by mouth 2 (two) times daily. 03/06/17  Yes [provider]  fluticasone (FLONASE) 50 MCG/ACT nasal spray Place 2 sprays into both nostrils daily. 06/09/20  Yes Verda Cumins, MD  insulin degludec (TRESIBA FLEXTOUCH) 200 UNIT/ML FlexTouch Pen Inject into the skin. 04/23/20  Yes [provider]  linagliptin (TRADJENTA) 5 MG TABS tablet Take 5 mg by mouth daily. 07/08/13  Yes [provider]  metFORMIN (GLUCOPHAGE) 1000 MG tablet Take by mouth. 04/25/20  Yes [provider]  Oxycodone HCl 10 MG TABS Take 1 tablet (10 mg total) by mouth every 4 (four) hours as needed (pain). 08/11/17  Yes Gouru, Aruna, MD  pantoprazole (PROTONIX) 40 MG tablet Take 40 mg by mouth daily.  06/16/17  Yes [provider]  pioglitazone (ACTOS) 45 MG tablet Take 45 mg by mouth daily.   Yes [provider]  rizatriptan (MAXALT-MLT) 10 MG disintegrating tablet Take 10 mg by mouth as needed for migraine. May repeat in 2 hours if needed   Yes [provider]  topiramate (TOPAMAX) 100 MG tablet Take 1 tablet (100 mg total) by mouth 2 (two) times daily. 05/20/17  Yes McCue, Janett Billow, NP  VENTOLIN HFA 108 (90 Base) MCG/ACT inhaler Inhale 1-2 puffs into the lungs every 6 (six) hours as needed for wheezing or shortness of breath.  06/16/17  Yes [provider]  Vitamin D, Ergocalciferol, (DRISDOL) 50000 units CAPS capsule Take 50,000 Units by mouth every 7 (seven) days. On Wednesday   Yes [provider]  XTAMPZA ER 9 MG C12A Take 1 capsule by mouth every 12 (twelve) hours. 05/29/20  Yes [provider]  acetaminophen (TYLENOL) 650 MG suppository Place 1 suppository (650 mg total) rectally every 6 (six) hours as needed for mild pain (or Fever >/= 101). 08/11/17   Gouru, Illene Silver, MD  ALPRAZolam  Duanne Moron) 1 MG tablet Take 1 mg by mouth 3 (three) times daily as needed for anxiety.     [provider]  BYDUREON BCISE 2 MG/0.85ML AUIJ Inject 2 mg into the skin once a week. On Thursday 03/03/17   [provider]  Erenumab-aooe 70 MG/ML SOAJ Inject 70 mg into the skin every 30 (thirty) days.     [provider]  furosemide (LASIX) 40 MG tablet Take 40 mg by mouth daily.  06/11/17   [provider]  gabapentin (NEURONTIN) 600 MG tablet Take 600 mg by mouth 4 (four) times daily. 05/29/20   [provider]  Insulin Glargine (BASAGLAR KWIKPEN) 100 UNIT/ML SOPN Inject 30 Units into the skin at bedtime.     [provider]  mupirocin ointment (BACTROBAN) 2 % Place 1 application into the nose 2 (two) times daily. 08/11/17  Gouru, Illene Silver, MD  nystatin (MYCOSTATIN/NYSTOP) powder Apply 1 g topically 2 (two) times daily.  06/16/17   [provider]  OXYCONTIN 30 MG 12 hr tablet Take 30 mg by mouth 2 (two) times daily. 03/15/17   [provider]  simvastatin (ZOCOR) 40 MG tablet Take 1 tablet (40 mg total) by mouth every evening. 10/13/12   Velvet Bathe, MD  Tiotropium Bromide-Olodaterol (STIOLTO RESPIMAT) 2.5-2.5 MCG/ACT AERS Inhale 2 puffs into the lungs daily. 09/23/17   Collene Gobble, MD  WELCHOL 625 MG tablet Take 1,875 mg by mouth 2 (two) times daily.  03/04/17   [provider]    Family History Family History  Problem Relation Age of Onset  . Diabetes Mother   . Hypertension Mother   . Hypertension Father     Social History Social History   Tobacco Use  . Smoking status: Current Every Day Smoker    Packs/day: 1.50    Years: 20.00    Pack years: 30.00    Types: Cigarettes  . Smokeless tobacco: Never Used  Vaping Use  . Vaping Use: Former  Substance Use Topics  . Alcohol use: No  . Drug use: Yes    Types: Marijuana    Comment: last smoked 2 days ago  8/4     Allergies   Tramadol and Augmentin  [amoxicillin-pot clavulanate]   Review of Systems Review of Systems  Constitutional: Negative for appetite change, chills, diaphoresis, fatigue and fever.  HENT: Positive for congestion and ear pain. Negative for ear discharge, postnasal drip, rhinorrhea, sinus pressure, sinus pain, sneezing and sore throat.   Eyes: Negative for pain.  Respiratory: Positive for cough, chest tightness and wheezing. Negative for shortness of breath.   Cardiovascular: Negative for chest pain and palpitations.  Gastrointestinal: Negative for abdominal pain, diarrhea, nausea and vomiting.  Genitourinary: Negative for dysuria.  Musculoskeletal: Negative for arthralgias, back pain, myalgias and neck pain.  Skin: Negative for color change, pallor, rash and wound.  Neurological: Negative for dizziness, light-headedness and headaches.  All other systems reviewed and are negative.    Physical Exam Triage Vital Signs ED Triage Vitals  Enc Vitals Group     BP 06/09/20 1450 116/73     Pulse Rate 06/09/20 1450 85     Resp 06/09/20 1450 15     Temp 06/09/20 1450 98.5 F (36.9 C)     Temp Source 06/09/20 1450 Oral     SpO2 06/09/20 1450 95 %     Weight 06/09/20 1443 255 lb (115.7 kg)     Height 06/09/20 1443 5\' 6"  (1.676 m)     Head Circumference --      Peak Flow --      Pain Score 06/09/20 1443 0     Pain Loc --      Pain Edu? --      Excl. in [place]? --    No data found.  Updated Vital Signs BP 116/73 (BP Location: Left Arm)   Pulse 85   Temp 98.5 F (36.9 C) (Oral)   Resp 15   Ht 5\' 6"  (1.676 m)   Wt 115.7 kg   SpO2 95%   BMI 41.16 kg/m   Visual Acuity Right Eye Distance:   Left Eye Distance:   Bilateral Distance:    Right Eye Near:   Left Eye Near:    Bilateral Near:     Physical Exam Vitals and nursing note reviewed.  Constitutional:  General: She is not in acute distress.    Appearance: Normal appearance. She is not ill-appearing, toxic-appearing or diaphoretic.  HENT:      Head: Normocephalic and atraumatic.     Nose: Congestion present. No rhinorrhea.     Mouth/Throat:     Mouth: Mucous membranes are moist.  Eyes:     General: No scleral icterus.       Right eye: No discharge.        Left eye: No discharge.     Extraocular Movements: Extraocular movements intact.     Conjunctiva/sclera: Conjunctivae normal.     Pupils: Pupils are equal, round, and reactive to light.  Cardiovascular:     Rate and Rhythm: Normal rate and regular rhythm.     Pulses: Normal pulses.     Heart sounds: Normal heart sounds. No murmur heard. No friction rub. No gallop.   Pulmonary:     Effort: Pulmonary effort is normal. No respiratory distress.     Breath sounds: No stridor. Wheezing present. No rhonchi or rales.  Musculoskeletal:     Cervical back: Normal range of motion and neck supple. No rigidity.  Lymphadenopathy:     Cervical: No cervical adenopathy.  Skin:    General: Skin is warm and dry.     Capillary Refill: Capillary refill takes less than 2 seconds.     Coloration: Skin is not jaundiced.     Findings: No bruising, erythema, lesion or rash.  Neurological:     General: No focal deficit present.     Mental Status: She is alert and oriented to person, place, and time.      UC Treatments / Results  Labs (all labs ordered are listed, but only abnormal results are displayed) Labs Reviewed  RESP PANEL BY RT-PCR (FLU A&B, COVID) [id number]    EKG   Radiology No results found.  Procedures Procedures (including critical care time)  Medications Ordered in UC Medications - No data to display  Initial Impression / Assessment and Plan / UC Course  I have reviewed the triage vital signs and the nursing notes.  Pertinent labs & imaging results that were available during my care of the patient were reviewed by me and considered in my medical decision making (see chart for details).  Clinical impression: Cough, chest tightness, wheeze, bilateral ear pressure.   Concerning for potentially URI with possible COVID or influenza.  Her symptoms have been present for about a week.  Complicating her situation as she has COPD and asthma and continues to smoke.  Treatment plan: 1.  The findings and treatment plan were discussed in detail with the patient.  Patient was in agreement. 2.  Recommended getting a respiratory panel.  It was negative for COVID and influenza. 3.  We will treat her for a COPD exacerbation.  Will prescribe a Z-Pak.  I did want to give her steroids but she does have diabetes and her blood sugars not well controlled.  We will hold on the steroids for now. 4.  Given her nasal congestion we will go ahead and prescribe Flonase. 5.  Plenty of rest, plenty of fluids, Tylenol or Motrin for any fever or discomfort which she currently does not have. 6.  If her symptoms persist I want her to get into her primary care provider or pulmonologist. 7.  If her symptoms were to worsen she needs to go to the ER. 8.  She was discharged from care in stable condition and she will follow-up  here as needed.    Final Clinical Impressions(s) / UC Diagnoses   Final diagnoses:  COPD exacerbation (HCC)  Cough  Wheeze  Chest tightness  Ear pressure, bilateral     Discharge Instructions     Your respiratory panel showed you are negative for COVID and negative for influenza. Given your history of COPD, your continued smoking, and the wheeze on exam I will treat you for COPD exacerbation.  We will send in an antibiotic, something for your nasal congestion.  I considered sending in a short course of prednisone, however you have diabetes and the last blood sugar I see was 306, so I am not comfortable doing that.  I want to continue with your inhaler and your other medications. If symptoms persist please see your primary care provider. If symptoms worsen please go to the ER. Please see educational handouts.    ED Prescriptions    Medication Sig Dispense Auth.  Provider   azithromycin (ZITHROMAX) 250 MG tablet Take 1 tablet (250 mg total) by mouth daily. Take first 2 tablets together, then 1 every day until finished. 6 tablet Verda Cumins, MD   fluticasone [facility]) 50 MCG/ACT nasal spray Place 2 sprays into both nostrils daily. 15.8 mL Verda Cumins, MD     PDMP not reviewed this encounter.   Verda Cumins, MD 06/09/20 2136

## 2020-06-12 ENCOUNTER — Ambulatory Visit: Payer: Self-pay

## 2020-06-12 ENCOUNTER — Other Ambulatory Visit: Payer: Self-pay

## 2020-06-12 ENCOUNTER — Emergency Department (HOSPITAL_COMMUNITY): Payer: Medicare Other

## 2020-06-12 ENCOUNTER — Encounter (HOSPITAL_COMMUNITY): Payer: Self-pay

## 2020-06-12 ENCOUNTER — Emergency Department (HOSPITAL_COMMUNITY)
Admission: EM | Admit: 2020-06-12 | Discharge: 2020-06-12 | Disposition: A | Discharge status: Home / Self Care | Payer: Medicare Other | Attending: Medical | Admitting: Medical

## 2020-06-12 DIAGNOSIS — H9209 Otalgia, unspecified ear: Secondary | ICD-10-CM | POA: Insufficient documentation

## 2020-06-12 DIAGNOSIS — R0602 Shortness of breath: Secondary | ICD-10-CM | POA: Diagnosis not present

## 2020-06-12 DIAGNOSIS — Z5321 Procedure and treatment not carried out due to patient leaving prior to being seen by health care provider: Secondary | ICD-10-CM | POA: Diagnosis not present

## 2020-06-12 DIAGNOSIS — R519 Headache, unspecified: Secondary | ICD-10-CM | POA: Diagnosis not present

## 2020-06-12 DIAGNOSIS — R6889 Other general symptoms and signs: Secondary | ICD-10-CM | POA: Diagnosis not present

## 2020-06-12 DIAGNOSIS — R059 Cough, unspecified: Secondary | ICD-10-CM | POA: Diagnosis not present

## 2020-06-12 NOTE — Telephone Encounter (Signed)
Pt. Seen at [facility] 06/09/20. Given Z-pak. Still coughing, shortness of breath. "I feel like I have pneumonia." "They didn't do a chest x-ray at [facility]." Has a rescue inhaler she uses. Used a pulse oximeter Sunday - 95%.States "I feel worse." Instructed to go to ED. Requests to be seen sooner in the practice to be established. Please advise.   Reason for Disposition . Patient sounds very sick or weak to the triager  Answer Assessment - Initial Assessment Questions 1. RESPIRATORY STATUS: "Describe your breathing?" (e.g., wheezing, shortness of breath, unable to speak, severe coughing)      Shortness of breath 2. ONSET: "When did this breathing problem begin?"      Last week 3. PATTERN "Does the difficult breathing come and go, or has it been constant since it started?"      Comes and goes 4. SEVERITY: "How bad is your breathing?" (e.g., mild, moderate, severe)    - MILD: No SOB at rest, mild SOB with walking, speaks normally in sentences, can lie down, no retractions, pulse < 100.    - MODERATE: SOB at rest, SOB with minimal exertion and prefers to sit, cannot lie down flat, speaks in phrases, mild retractions, audible wheezing, pulse 100-120.    - SEVERE: Very SOB at rest, speaks in single words, struggling to breathe, sitting hunched forward, retractions, pulse > 120      Moderate 5. RECURRENT SYMPTOM: "Have you had difficulty breathing before?" If Yes, ask: "When was the last time?" and "What happened that time?"      Yes 6. CARDIAC HISTORY: "Do you have any history of heart disease?" (e.g., heart attack, angina, bypass surgery, angioplasty)      CVA 7. LUNG HISTORY: "Do you have any history of lung disease?"  (e.g., pulmonary embolus, asthma, emphysema)     COPD 8. CAUSE: "What do you think is causing the breathing problem?"      COPD 9. OTHER SYMPTOMS: "Do you have any other symptoms? (e.g., dizziness, runny nose, cough, chest pain, fever)     Sinus pressure 10. O2 SATURATION MONITOR:  "Do you  use an oxygen saturation monitor (pulse oximeter) at home?" If Yes, "What is your reading (oxygen level) today?" "What is your usual oxygen saturation reading?" (e.g., 95%)       Yes - Sunday 95% 11. PREGNANCY: "Is there any chance you are pregnant?" "When was your last menstrual period?"       No 12. TRAVEL: "Have you traveled out of the country in the last month?" (e.g., travel history, exposures)       No  Protocols used: BREATHING DIFFICULTY-A-AH

## 2020-06-12 NOTE — ED Notes (Signed)
Pt left. Pt didn't want to wait any longer.

## 2020-06-12 NOTE — ED Provider Notes (Signed)
Emergency Medicine Provider Triage Evaluation Note  [name] , a 55 y.o. female  was evaluated in triage.  Pt complains of continued sinus pressure, bilateral ear pain, dry cough. Reports she was around her grandson who had a cough and 2 days later became symptomatic. Seen at [facility] and prescribed abx; has 1 day left of zpack without improvement. Tested negative for COVID and flu. Mild fever.   Review of Systems  Positive: + cough, sinus pressure, bilateral ear pain Negative: - body aches  Physical Exam  BP 105/65 (BP Location: Right Arm)   Pulse 93   Temp 97.9 F (36.6 C) (Oral)   Resp 18   Ht 5\' 6"  (1.676 m)   Wt 115.7 kg   SpO2 91%   BMI 41.16 kg/m  Gen:   Awake, no distress   Resp:  Normal effort  MSK:   Moves extremities without difficulty  Other:  + bilateral frontal and maxillary sinus TTP  Medical Decision Making  Medically screening exam initiated at 9:28 PM.  Appropriate orders placed.  [name] was informed that the remainder of the evaluation will be completed by another provider, this initial triage assessment does not replace that evaluation, and the importance of remaining in the ED until their evaluation is complete.     Eustaquio Maize, PA-C 06/12/20 2131    Daleen Bo, MD 06/12/20 2258

## 2020-06-12 NOTE — Telephone Encounter (Signed)
Yes I agree. Best advice is to reconsider returning to other Urgent Care or hospital ED if severely ill. She may need imaging and other testing.  Nobie Putnam, DO [facility] 06/12/2020, 1:50 PM

## 2020-06-12 NOTE — Telephone Encounter (Signed)
Copied from [place] [id number]. Topic: Appointment Scheduling - Scheduling Inquiry for Clinic >> Jun 12, 2020 11:35 AM Erick Blinks wrote: Reason for CRM: Pt called reporting that she is extremely sick, she has tested negative for covid/flu however she says she feels terrible and has been battling whatever she is experiencing for 2 weeks. Warm Transferred to triage. Patient is requesting to be seen sooner than upcoming appt  Best contact: [phone]   I called the patient and informed her unfortunately Dr. Raliegh Ip doesn't have any sooner new patient appt, I recommended that she be seen at the Urgent Care. The pt informed me that she was seen there over the weekend and currently she feels worse. She said she was going to go to the [facility] Emergency room.

## 2020-06-12 NOTE — ED Triage Notes (Signed)
Patient reports she has been having a SOB exacerbation for about 1 week, reports ear pain and sinus congestion, was swabbed for COVID and Flu and was negative at [facility], states she is still not feeling better, was given a zpack without relief

## 2020-06-25 DIAGNOSIS — E119 Type 2 diabetes mellitus without complications: Secondary | ICD-10-CM | POA: Diagnosis not present

## 2020-06-25 DIAGNOSIS — G990 Autonomic neuropathy in diseases classified elsewhere: Secondary | ICD-10-CM | POA: Diagnosis not present

## 2020-06-25 DIAGNOSIS — Z049 Encounter for examination and observation for unspecified reason: Secondary | ICD-10-CM | POA: Diagnosis not present

## 2020-06-26 DIAGNOSIS — M961 Postlaminectomy syndrome, not elsewhere classified: Secondary | ICD-10-CM | POA: Diagnosis not present

## 2020-06-26 DIAGNOSIS — M545 Low back pain, unspecified: Secondary | ICD-10-CM | POA: Diagnosis not present

## 2020-06-26 DIAGNOSIS — Z720 Tobacco use: Secondary | ICD-10-CM | POA: Diagnosis not present

## 2020-06-26 DIAGNOSIS — Z87891 Personal history of nicotine dependence: Secondary | ICD-10-CM | POA: Diagnosis not present

## 2020-06-26 DIAGNOSIS — Z79891 Long term (current) use of opiate analgesic: Secondary | ICD-10-CM | POA: Diagnosis not present

## 2020-06-29 DIAGNOSIS — G894 Chronic pain syndrome: Secondary | ICD-10-CM | POA: Diagnosis not present

## 2020-06-29 DIAGNOSIS — M961 Postlaminectomy syndrome, not elsewhere classified: Secondary | ICD-10-CM | POA: Diagnosis not present

## 2020-06-29 DIAGNOSIS — M25561 Pain in right knee: Secondary | ICD-10-CM | POA: Diagnosis not present

## 2020-07-10 ENCOUNTER — Encounter: Payer: Self-pay | Admitting: Family Medicine

## 2020-07-10 ENCOUNTER — Other Ambulatory Visit: Payer: Self-pay

## 2020-07-10 ENCOUNTER — Ambulatory Visit (INDEPENDENT_AMBULATORY_CARE_PROVIDER_SITE_OTHER): Payer: Medicare Other | Admitting: Family Medicine

## 2020-07-10 VITALS — BP 143/83 | HR 96 | Ht 66.0 in | Wt 255.0 lb

## 2020-07-10 DIAGNOSIS — G43711 Chronic migraine without aura, intractable, with status migrainosus: Secondary | ICD-10-CM | POA: Diagnosis not present

## 2020-07-10 DIAGNOSIS — R490 Dysphonia: Secondary | ICD-10-CM

## 2020-07-10 DIAGNOSIS — F331 Major depressive disorder, recurrent, moderate: Secondary | ICD-10-CM

## 2020-07-10 DIAGNOSIS — G894 Chronic pain syndrome: Secondary | ICD-10-CM

## 2020-07-10 DIAGNOSIS — Z794 Long term (current) use of insulin: Secondary | ICD-10-CM

## 2020-07-10 DIAGNOSIS — I6930 Unspecified sequelae of cerebral infarction: Secondary | ICD-10-CM | POA: Diagnosis not present

## 2020-07-10 DIAGNOSIS — J432 Centrilobular emphysema: Secondary | ICD-10-CM

## 2020-07-10 DIAGNOSIS — E1169 Type 2 diabetes mellitus with other specified complication: Secondary | ICD-10-CM

## 2020-07-10 DIAGNOSIS — Z7689 Persons encountering health services in other specified circumstances: Secondary | ICD-10-CM | POA: Diagnosis not present

## 2020-07-10 MED ORDER — CITALOPRAM HYDROBROMIDE 40 MG PO TABS: 40.0000 mg | ORAL_TABLET | Freq: Every day | ORAL | 1 refills | Status: DC

## 2020-07-10 MED ORDER — BREZTRI AEROSPHERE 160-9-4.8 MCG/ACT IN AERO: 2.0000 | INHALATION_SPRAY | Freq: Two times a day (BID) | RESPIRATORY_TRACT | 11 refills | Status: DC

## 2020-07-10 MED ORDER — TOPIRAMATE 100 MG PO TABS: 100.0000 mg | ORAL_TABLET | Freq: Two times a day (BID) | ORAL | 1 refills | Status: DC

## 2020-07-10 MED ORDER — ALBUTEROL SULFATE HFA 108 (90 BASE) MCG/ACT IN AERS: 2.0000 | INHALATION_SPRAY | RESPIRATORY_TRACT | 3 refills | Status: DC | PRN

## 2020-07-10 NOTE — Progress Notes (Signed)
Subjective:    Patient ID: [name], female    DOB: 28-Jul-1965, 55 y.o.   MRN: [id number]  [name] is a 55 y.o. female presenting on 07/10/2020 for Establish Care  Here with spouse Ysidro Evert  HPI  Centrilobular Emphysema History of COVID19 in 08/2019 Tobacco Abuse - Currently smoking 2ppd for 40-50 years. - History on mass on L lung No maintenance therapy Has Albuterol PRN  Major Depression Anxiety, panic attacks Previously on Alprazolam in past, came off due to opiate pain medication, has been off for years. - Followed by therapist - Burnett Kanaris On Citalopram 40mg  daily needs refill  Type 2 Diabetes Diabetic Neuropathy Followed by Dr Gabriel Carina [facility] - Gabapentin 600mg  4 times a day Managed on Tresiba insulin, Tradjenta oral, ACTOS 45mg  oral, Metformin 1000mg  BID  Chronic Pain Syndrome Pain Management - [employer] in [place] because she is on pain medication. On Xtampza ER 9mg  BID, on Oxycodone 10mg  TID PRN for migraine  Migraine Headaches Chronic problem Off topamax  previously on for prevention 100mg  BID on triptan as well in past.  History of CVA 8 years ago Difficulty with recall    Depression screen [facility] 2/9 07/10/2020  Decreased Interest 2  Down, Depressed, Hopeless 1  PHQ - 2 Score 3  Altered sleeping 3  Tired, decreased energy 3  Change in appetite 0  Feeling bad or failure about yourself  0  Trouble concentrating 0  Moving slowly or fidgety/restless 0  Suicidal thoughts 0  PHQ-9 Score 9  Difficult doing work/chores Not difficult at all   GAD 7 : Generalized Anxiety Score 07/10/2020  Nervous, Anxious, on Edge 1  Control/stop worrying 1  Worry too much - different things 1  Trouble relaxing 3  Restless 1  Easily annoyed or irritable 3  Afraid - awful might happen 0  Total GAD 7 Score 10  Anxiety Difficulty Somewhat difficult    Past Medical History:  Diagnosis Date   Anxiety    Cancer ([place])    a spot on  liver and treated    Complication of anesthesia    restless,easily upset   COPD (chronic obstructive pulmonary disease) ([place])    Depression    Diabetes mellitus without complication (HCC)    Diverticulitis    Fatty liver    GERD (gastroesophageal reflux disease)    Headache(784.0)    migraines   History of kidney stones    Hyperlipidemia    Hypertension    Pneumonia    Restless    Stroke [facility])    Past Surgical History:  Procedure Laterality Date   ABDOMINAL HYSTERECTOMY     ACHILLES TENDON LENGTHENING Right 08/18/2017   Procedure: ACHILLES TENDON LENGTHENING;  Surgeon: Altamese Kingston, MD;  Location: [place];  Service: Orthopedics;  Laterality: Right;   ANTERIOR CERVICAL DECOMP/DISCECTOMY FUSION N/A 03/11/2012   Procedure: ANTERIOR CERVICAL DECOMPRESSION/DISCECTOMY FUSION 1 LEVEL;  Surgeon: Ophelia Charter, MD;  Location: [place] [facility];  Service: Neurosurgery;  Laterality: N/A;  Cervical five-six anterior cervical decompression with fusion interbody prothesis plating and bonegraft   BACK SURGERY     EXTERNAL FIXATION LEG Right 08/10/2017   Procedure: EXTERNAL FIXATION ANKLE;  Surgeon: Dereck Leep, MD;  Location: [facility];  Service: Orthopedics;  Laterality: Right;   EXTERNAL FIXATION REMOVAL Right 08/18/2017   Procedure: REMOVAL EXTERNAL FIXATION LEG;  Surgeon: Altamese Latta, MD;  Location: [place];  Service: Orthopedics;  Laterality: Right;   EYE SURGERY  HERNIA REPAIR     JOINT REPLACEMENT     bil knees   ORIF ANKLE FRACTURE Right 08/18/2017   Procedure: OPEN REDUCTION INTERNAL FIXATION (ORIF) ANKLE FRACTURE;  Surgeon: Altamese Coker, MD;  Location: [place];  Service: Orthopedics;  Laterality: Right;   REPLACEMENT TOTAL KNEE BILATERAL     Social History   Socioeconomic History   Marital status: Married    Spouse name: Not on file   Number of children: Not on file   Years of education: Not on file   Highest education level: Not on file  Occupational History   Not on file   Tobacco Use   Smoking status: Every Day    Packs/day: 1.50    Years: 20.00    Pack years: 30.00    Types: Cigarettes   Smokeless tobacco: Never  Vaping Use   Vaping Use: Former  Substance and Sexual Activity   Alcohol use: No   Drug use: Yes    Types: Marijuana    Comment: last smoked 2 days ago  8/4   Sexual activity: Not on file  Other Topics Concern   Not on file  Social History Narrative   Not on file   Social Determinants of Health   Financial Resource Strain: Not on file  Food Insecurity: Not on file  Transportation Needs: Not on file  Physical Activity: Not on file  Stress: Not on file  Social Connections: Not on file  Intimate Partner Violence: Not on file   Family History  Problem Relation Age of Onset   Diabetes Mother    Hypertension Mother    Hypertension Father    Current Outpatient Medications on File Prior to Visit  Medication Sig   acetaminophen (TYLENOL) 650 MG suppository Place 1 suppository (650 mg total) rectally every 6 (six) hours as needed for mild pain (or Fever >/= 101).   atorvastatin (LIPITOR) 40 MG tablet Take 40 mg by mouth at bedtime.   clopidogrel (PLAVIX) 75 MG tablet Take 75 mg by mouth daily.   fluticasone (FLONASE) 50 MCG/ACT nasal spray Place 2 sprays into both nostrils daily.   gabapentin (NEURONTIN) 600 MG tablet Take 600 mg by mouth 4 (four) times daily.   insulin degludec (TRESIBA FLEXTOUCH) 200 UNIT/ML FlexTouch Pen Inject into the skin.   linagliptin (TRADJENTA) 5 MG TABS tablet Take 5 mg by mouth daily.   metFORMIN (GLUCOPHAGE) 1000 MG tablet Take 1,000 mg by mouth 2 (two) times daily with a meal.   mupirocin ointment (BACTROBAN) 2 % Place 1 application into the nose 2 (two) times daily.   nystatin (MYCOSTATIN/NYSTOP) powder Apply 1 g topically 2 (two) times daily.    pantoprazole (PROTONIX) 40 MG tablet Take 40 mg by mouth daily.    pioglitazone (ACTOS) 45 MG tablet Take 45 mg by mouth daily.   Vitamin D, Ergocalciferol,  (DRISDOL) 50000 units CAPS capsule Take 50,000 Units by mouth every 7 (seven) days. On Wednesday   WELCHOL 625 MG tablet Take 1,875 mg by mouth 2 (two) times daily.    XTAMPZA ER 9 MG C12A Take 1 capsule by mouth every 12 (twelve) hours.   Oxycodone HCl 10 MG TABS Take 1 tablet (10 mg total) by mouth 3 (three) times daily as needed (pain).   No current facility-administered medications on file prior to visit.    Review of Systems Per HPI unless specifically indicated above     Objective:    BP (!) 143/83   Pulse 96  Ht 5\' 6"  (1.676 m)   Wt 255 lb (115.7 kg)   SpO2 97%   BMI 41.16 kg/m   Wt Readings from Last 3 Encounters:  07/10/20 255 lb (115.7 kg)  06/12/20 255 lb (115.7 kg)  06/09/20 255 lb (115.7 kg)    Physical Exam Vitals and nursing note reviewed.  Constitutional:      General: She is not in acute distress.    Appearance: She is well-developed. She is obese. She is not diaphoretic.     Comments: Well-appearing, comfortable, cooperative  HENT:     Head: Normocephalic and atraumatic.  Eyes:     General:        Right eye: No discharge.        Left eye: No discharge.     Conjunctiva/sclera: Conjunctivae normal.  Neck:     Thyroid: No thyromegaly.  Cardiovascular:     Rate and Rhythm: Normal rate and regular rhythm.     Heart sounds: Normal heart sounds. No murmur heard. Pulmonary:     Effort: Pulmonary effort is normal. No respiratory distress.     Breath sounds: No wheezing or rales.     Comments: Cough productive. Reduced air movement overall. No focal abnormal breath sounds. Musculoskeletal:        General: Normal range of motion.     Cervical back: Normal range of motion and neck supple.     Comments: In wheelchair  Lymphadenopathy:     Cervical: No cervical adenopathy.  Skin:    General: Skin is warm and dry.     Findings: No erythema or rash.  Neurological:     Mental Status: She is alert and oriented to person, place, and time.  Psychiatric:         Behavior: Behavior normal.     Comments: Well groomed, good eye contact, normal speech and thoughts   Results for orders placed or performed during the hospital encounter of 06/09/20  Resp Panel by RT-PCR (Flu A&B, Covid) Nasopharyngeal Swab   Specimen: Nasopharyngeal Swab; Nasopharyngeal(NP) swabs in vial transport medium  Result Value Ref Range   SARS Coronavirus 2 by RT PCR NEGATIVE NEGATIVE   Influenza A by PCR NEGATIVE NEGATIVE   Influenza B by PCR NEGATIVE NEGATIVE      Assessment & Plan:   Problem List Items Addressed This Visit     Centrilobular emphysema ([place]) - Primary   Relevant Medications   albuterol (VENTOLIN HFA) 108 (90 Base) MCG/ACT inhaler   Budeson-Glycopyrrol-Formoterol (BREZTRI AEROSPHERE) 160-9-4.8 MCG/ACT AERO   Other Visit Diagnoses     Encounter to establish care with new doctor       Moderate episode of recurrent major depressive disorder (HCC)       Relevant Medications   citalopram (CELEXA) 40 MG tablet   Chronic pain syndrome       Relevant Medications   citalopram (CELEXA) 40 MG tablet   Oxycodone HCl 10 MG TABS   topiramate (TOPAMAX) 100 MG tablet   Intractable persistent migraine aura with cerebral infarction and status migrainosus (HCC)       Relevant Medications   citalopram (CELEXA) 40 MG tablet   Oxycodone HCl 10 MG TABS   topiramate (TOPAMAX) 100 MG tablet   Hoarseness of voice          Centrilobular Emphysema (COPD) Without acute flare, but has some chronic persistent now subacute worsening symptoms with productive cough. No wheezing or other symptoms, but unable to resolve from prior illness respiatory  cough - Inadequately controlled COPD - Active heavy tobacco abuse 1.5 to 2ppd 40-50 yr - Refill Albuterol proair PRN use - START Breztri sample 2 puff BID, 7 day inhaler given, copay card, she can let us know if preferred to use this med or switch to other, we can order when requested - Will need referral to Pulmonology given  severity of her COPD and they can discuss further treatments and testing and management - Smoking cessation is going to be critical - Hoarse voice can be from smoking and cough, f/u if unresolved - consider ENT  Chronic Pain Syndrome OA/DJD history of traumatic arthritis - multiple joints Followed by Pain Management in [place] Reviewed rx medications Continue on current therapy per pain management  Chronic MIgraine Without headache now Re order Topamax, titrate up to prior dose Consider re order triptan PRN (Rizatriptan)  History CVA with residual cognitive deficit    Meds ordered this encounter  Medications   albuterol (VENTOLIN HFA) 108 (90 Base) MCG/ACT inhaler    Sig: Inhale 2 puffs into the lungs every 4 (four) hours as needed for wheezing or shortness of breath.    Dispense:  8 g    Refill:  3   citalopram (CELEXA) 40 MG tablet    Sig: Take 1 tablet (40 mg total) by mouth daily.    Dispense:  90 tablet    Refill:  1   topiramate (TOPAMAX) 100 MG tablet    Sig: Take 1 tablet (100 mg total) by mouth 2 (two) times daily.    Dispense:  180 tablet    Refill:  1   Budeson-Glycopyrrol-Formoterol (BREZTRI AEROSPHERE) 160-9-4.8 MCG/ACT AERO    Sig: Inhale 2 puffs into the lungs 2 (two) times daily.    Dispense:  10.7 g    Refill:  11      Follow up plan: Return in about 6 weeks (around 08/21/2020) for 6 week follow-up COPD inhaler/pulm ref, hoarse voice, DM updates.  Nobie Putnam, [facility] 07/10/2020, 3:44 PM

## 2020-07-10 NOTE — Patient Instructions (Addendum)
Thank you for coming to the office today.  For COPD We will refer you to the local Lung Doctors  [facility] [address], [place], [place] Phone: [phone]  (Let us know when the insurance is updated, and we can refer)  Start sample inhaler Breztri 2 puffs twice a day - 7 day, call back or request which inhaler we can proceed w/ if the same one Breztri or possible Trelegy.  We can consider ENT for hoarse voice if unresolved.   Please schedule a Follow-up Appointment to: Return in about 6 weeks (around 08/21/2020) for 6 week follow-up COPD inhaler/pulm ref, hoarse voice, DM updates.  If you have any other questions or concerns, please feel free to call the office or send a message through [place]. You may also schedule an earlier appointment if necessary.  Additionally, you may be receiving a survey about your experience at our office within a few days to 1 week by e-mail or mail. We value your feedback.  Nobie Putnam, DO [place]

## 2020-07-19 ENCOUNTER — Other Ambulatory Visit: Payer: Self-pay

## 2020-07-19 ENCOUNTER — Telehealth (INDEPENDENT_AMBULATORY_CARE_PROVIDER_SITE_OTHER): Payer: Medicare Other | Admitting: Family Medicine

## 2020-07-19 DIAGNOSIS — Z91199 Patient's noncompliance with other medical treatment and regimen due to unspecified reason: Secondary | ICD-10-CM

## 2020-07-19 DIAGNOSIS — Z5329 Procedure and treatment not carried out because of patient's decision for other reasons: Secondary | ICD-10-CM

## 2020-07-19 NOTE — Progress Notes (Signed)
No show for virtual appointment today

## 2020-07-23 ENCOUNTER — Ambulatory Visit (INDEPENDENT_AMBULATORY_CARE_PROVIDER_SITE_OTHER): Payer: Medicare Other | Admitting: Family Medicine

## 2020-07-23 ENCOUNTER — Other Ambulatory Visit: Payer: Self-pay

## 2020-07-23 ENCOUNTER — Encounter: Payer: Self-pay | Admitting: Family Medicine

## 2020-07-23 VITALS — Ht 66.0 in | Wt 255.0 lb

## 2020-07-23 DIAGNOSIS — M961 Postlaminectomy syndrome, not elsewhere classified: Secondary | ICD-10-CM | POA: Diagnosis not present

## 2020-07-23 DIAGNOSIS — B370 Candidal stomatitis: Secondary | ICD-10-CM

## 2020-07-23 DIAGNOSIS — I69351 Hemiplegia and hemiparesis following cerebral infarction affecting right dominant side: Secondary | ICD-10-CM | POA: Diagnosis not present

## 2020-07-23 DIAGNOSIS — Z87891 Personal history of nicotine dependence: Secondary | ICD-10-CM | POA: Diagnosis not present

## 2020-07-23 DIAGNOSIS — Z029 Encounter for administrative examinations, unspecified: Secondary | ICD-10-CM | POA: Diagnosis not present

## 2020-07-23 DIAGNOSIS — J432 Centrilobular emphysema: Secondary | ICD-10-CM

## 2020-07-23 DIAGNOSIS — Z720 Tobacco use: Secondary | ICD-10-CM | POA: Diagnosis not present

## 2020-07-23 DIAGNOSIS — Z79891 Long term (current) use of opiate analgesic: Secondary | ICD-10-CM | POA: Diagnosis not present

## 2020-07-23 MED ORDER — TRELEGY ELLIPTA 100-62.5-25 MCG/INH IN AEPB: 1.0000 | INHALATION_SPRAY | Freq: Every day | RESPIRATORY_TRACT | 11 refills | Status: DC

## 2020-07-23 MED ORDER — FLUCONAZOLE 200 MG PO TABS: 200.0000 mg | ORAL_TABLET | Freq: Every day | ORAL | 1 refills | Status: DC

## 2020-07-23 NOTE — Progress Notes (Signed)
Virtual Visit via Telephone The purpose of this virtual visit is to provide medical care while limiting exposure to the novel coronavirus (COVID19) for both patient and office staff.  Consent was obtained for phone visit:  Yes.   Answered questions that patient had about telehealth interaction:  Yes.   I discussed the limitations, risks, security and privacy concerns of performing an evaluation and management service by telephone. I also discussed with the patient that there may be a patient responsible charge related to this service. The patient expressed understanding and agreed to proceed.  Patient Location: Home Provider Location: [facility] (Office)  Participants in virtual visit: - Patient: [name] - CMA: Orinda Kenner, [place] - Provider: Dr Parks Ranger  ---------------------------------------------------------------------- Chief Complaint  Patient presents with   Oral Pain    S: Reviewed CMA documentation. I have called patient and gathered additional HPI as follows:  Oral Thrush Reports new problem recently with oral pain tongue cheeks ad gums, with some white patches similar to previous thrush. Asking for treatment. Food causes pain and she has had reduced PO intake recently. She has diabetes and uses inhaled steroid inhaler that can cause this too  Centrilobular Emphysema History of COVID19 in 08/2019 Tobacco Abuse - Currently smoking 2ppd for 40-50 years. - History on mass on L lung Has Albuterol PRN Last visit given sample Breztri inhaler for 7 days, some notable improvement on this, she is interested in new rx insurance will cover either [employer] or Trelegy.  Her medicaid card should have been changed now. Says we can call [facility] to verify for referrals.  Denies any high risk travel to areas of current concern for COVID19. Denies any known or suspected exposure to person with or possibly with COVID19.  Denies any fevers, chills, sweats,  body ache, cough, shortness of breath, sinus pain or pressure, headache, abdominal pain, diarrhea  Past Medical History:  Diagnosis Date   Anxiety    Cancer ([place])    a spot on liver and treated    Complication of anesthesia    restless,easily upset   COPD (chronic obstructive pulmonary disease) ([place])    Depression    Diabetes mellitus without complication ([place])    Diverticulitis    Fatty liver    GERD (gastroesophageal reflux disease)    Headache(784.0)    migraines   History of kidney stones    Hyperlipidemia    Hypertension    Pneumonia    Restless    Stroke [facility])    Social History   Tobacco Use   Smoking status: Every Day    Packs/day: 1.50    Years: 20.00    Pack years: 30.00    Types: Cigarettes   Smokeless tobacco: Never  Vaping Use   Vaping Use: Former  Substance Use Topics   Alcohol use: No   Drug use: Yes    Types: Marijuana    Comment: last smoked 2 days ago  8/4    Current Outpatient Medications:    fluconazole (DIFLUCAN) 200 MG tablet, Take 1 tablet (200 mg total) by mouth daily. For 7 days, may repeat course if unresolved w/ refill, Disp: 7 tablet, Rfl: 1   TRELEGY ELLIPTA 100-62.5-25 MCG/INH AEPB, Inhale 1 puff into the lungs daily., Disp: 60 each, Rfl: 11   acetaminophen (TYLENOL) 650 MG suppository, Place 1 suppository (650 mg total) rectally every 6 (six) hours as needed for mild pain (or Fever >/= 101)., Disp: 12 suppository, Rfl: 0  albuterol (VENTOLIN HFA) 108 (90 Base) MCG/ACT inhaler, Inhale 2 puffs into the lungs every 4 (four) hours as needed for wheezing or shortness of breath., Disp: 8 g, Rfl: 3   atorvastatin (LIPITOR) 40 MG tablet, Take 40 mg by mouth at bedtime., Disp: , Rfl:    citalopram (CELEXA) 40 MG tablet, Take 1 tablet (40 mg total) by mouth daily., Disp: 90 tablet, Rfl: 1   clopidogrel (PLAVIX) 75 MG tablet, Take 75 mg by mouth daily., Disp: , Rfl:    fluticasone (FLONASE) 50 MCG/ACT nasal spray, Place 2 sprays into both nostrils  daily., Disp: 15.8 mL, Rfl: 0   gabapentin (NEURONTIN) 600 MG tablet, Take 600 mg by mouth 4 (four) times daily., Disp: , Rfl:    insulin degludec (TRESIBA FLEXTOUCH) 200 UNIT/ML FlexTouch Pen, Inject into the skin., Disp: , Rfl:    linagliptin (TRADJENTA) 5 MG TABS tablet, Take 5 mg by mouth daily., Disp: , Rfl:    metFORMIN (GLUCOPHAGE) 1000 MG tablet, Take 1,000 mg by mouth 2 (two) times daily with a meal., Disp: , Rfl:    mupirocin ointment (BACTROBAN) 2 %, Place 1 application into the nose 2 (two) times daily., Disp: 22 g, Rfl: 0   nystatin (MYCOSTATIN/NYSTOP) powder, Apply 1 g topically 2 (two) times daily. , Disp: , Rfl:    Oxycodone HCl 10 MG TABS, Take 1 tablet (10 mg total) by mouth 3 (three) times daily as needed (pain)., Disp: 30 tablet, Rfl: 0   pantoprazole (PROTONIX) 40 MG tablet, Take 40 mg by mouth daily. , Disp: , Rfl:    pioglitazone (ACTOS) 45 MG tablet, Take 45 mg by mouth daily., Disp: , Rfl:    topiramate (TOPAMAX) 100 MG tablet, Take 1 tablet (100 mg total) by mouth 2 (two) times daily., Disp: 180 tablet, Rfl: 1   Vitamin D, Ergocalciferol, (DRISDOL) 50000 units CAPS capsule, Take 50,000 Units by mouth every 7 (seven) days. On Wednesday, Disp: , Rfl:    WELCHOL 625 MG tablet, Take 1,875 mg by mouth 2 (two) times daily. , Disp: , Rfl:    XTAMPZA ER 9 MG C12A, Take 1 capsule by mouth every 12 (twelve) hours., Disp: , Rfl:   Depression screen [facility] 2/9 07/10/2020  Decreased Interest 2  Down, Depressed, Hopeless 1  PHQ - 2 Score 3  Altered sleeping 3  Tired, decreased energy 3  Change in appetite 0  Feeling bad or failure about yourself  0  Trouble concentrating 0  Moving slowly or fidgety/restless 0  Suicidal thoughts 0  PHQ-9 Score 9  Difficult doing work/chores Not difficult at all    GAD 7 : Generalized Anxiety Score 07/10/2020  Nervous, Anxious, on Edge 1  Control/stop worrying 1  Worry too much - different things 1  Trouble relaxing 3  Restless 1  Easily  annoyed or irritable 3  Afraid - awful might happen 0  Total GAD 7 Score 10  Anxiety Difficulty Somewhat difficult    -------------------------------------------------------------------------- O: No physical exam performed due to remote telephone encounter.  Lab results reviewed.  Recent Results (from the past 2160 hour(s))  Resp Panel by RT-PCR (Flu A&B, Covid) Nasopharyngeal Swab     Status: None   Collection Time: 06/09/20  2:52 PM   Specimen: Nasopharyngeal Swab; Nasopharyngeal(NP) swabs in vial transport medium  Result Value Ref Range   SARS Coronavirus 2 by RT PCR NEGATIVE NEGATIVE    Comment: (NOTE) SARS-CoV-2 target nucleic acids are NOT DETECTED.  The SARS-CoV-2 RNA  is generally detectable in upper respiratory specimens during the acute phase of infection. The lowest concentration of SARS-CoV-2 viral copies this assay can detect is 138 copies/mL. A negative result does not preclude SARS-Cov-2 infection and should not be used as the sole basis for treatment or other patient management decisions. A negative result may occur with  improper specimen collection/handling, submission of specimen other than nasopharyngeal swab, presence of viral mutation(s) within the areas targeted by this assay, and inadequate number of viral copies(<138 copies/mL). A negative result must be combined with clinical observations, patient history, and epidemiological information. The expected result is Negative.  Fact Sheet for Patients:  EntrepreneurPulse.com.au  Fact Sheet for Healthcare Providers:  IncredibleEmployment.be  This test is no t yet approved or cleared by the [place] FDA and  has been authorized for detection and/or diagnosis of SARS-CoV-2 by FDA under an Emergency Use Authorization (EUA). This EUA will remain  in effect (meaning this test can be used) for the duration of the COVID-19 declaration under Section 564(b)(1) of the Act,  21 U.S.C.section 360bbb-3(b)(1), unless the authorization is terminated  or revoked sooner.       Influenza A by PCR NEGATIVE NEGATIVE   Influenza B by PCR NEGATIVE NEGATIVE    Comment: (NOTE) The Xpert Xpress SARS-CoV-2/FLU/RSV plus assay is intended as an aid in the diagnosis of influenza from Nasopharyngeal swab specimens and should not be used as a sole basis for treatment. Nasal washings and aspirates are unacceptable for Xpert Xpress SARS-CoV-2/FLU/RSV testing.  Fact Sheet for Patients: EntrepreneurPulse.com.au  Fact Sheet for Healthcare Providers: IncredibleEmployment.be  This test is not yet approved or cleared by the [place] FDA and has been authorized for detection and/or diagnosis of SARS-CoV-2 by FDA under an Emergency Use Authorization (EUA). This EUA will remain in effect (meaning this test can be used) for the duration of the COVID-19 declaration under Section 564(b)(1) of the Act, 21 U.S.C. section 360bbb-3(b)(1), unless the authorization is terminated or revoked.  Performed at [facility], [address], [place], [place] [address]     -------------------------------------------------------------------------- A&P:  Problem List Items Addressed This Visit     Centrilobular emphysema ([place]) - Primary   Relevant Medications   TRELEGY ELLIPTA 100-62.5-25 MCG/INH AEPB   Other Visit Diagnoses     Oral thrush       Relevant Medications   fluconazole (DIFLUCAN) 200 MG tablet      Centrilobular Emphysema Chronic problem Improved on Breztri temporarily We will switch to approved Trelegy option 1 puff daily, new rx sent to pharmacy. Advised rinse wash mouth out after usage to avoid worse thrush We can do referral to Pulmonology when ready, she can message me sooner or trial Trelegy for 1-2 weeks to see if effective first and may delay referral. We should be good to refer by that point.  Oral  Thrush Trial on Diflucan oral 200mg  daily x 7 days, repeat if unresolved.   Meds ordered this encounter  Medications   TRELEGY ELLIPTA 100-62.5-25 MCG/INH AEPB    Sig: Inhale 1 puff into the lungs daily.    Dispense:  60 each    Refill:  11   fluconazole (DIFLUCAN) 200 MG tablet    Sig: Take 1 tablet (200 mg total) by mouth daily. For 7 days, may repeat course if unresolved w/ refill    Dispense:  7 tablet    Refill:  1    Follow-up: - Return as needed  Patient verbalizes understanding  with the above medical recommendations including the limitation of remote medical advice.  Specific follow-up and call-back criteria were given for patient to follow-up or seek medical care more urgently if needed.   - Time spent in direct consultation with patient on phone: 8 minutes  Nobie Putnam, [facility] 07/23/2020, 4:39 PM

## 2020-07-23 NOTE — Patient Instructions (Addendum)
Thank you for coming to the office today.  Diflucan 200mg  daily for 7 days, for thrush. Can repeat course with refill if it returns or is unresolved max is 14 days  Stop Breztri, start new Trelegy inhaler rx, 1 puff a day for COPD.  After 2-4 weeks trial on Trelegy if you feel like it is helping a lot, we can proceed and if ready we can refer to Pulmonology for more evaluation. If it is not helping please message me sooner and we will proceed w/ referral. Let me know  Please schedule a Follow-up Appointment to: Return if symptoms worsen or fail to improve.  If you have any other questions or concerns, please feel free to call the office or send a message through [place]. You may also schedule an earlier appointment if necessary.  Additionally, you may be receiving a survey about your experience at our office within a few days to 1 week by e-mail or mail. We value your feedback.  Nobie Putnam, DO [place]

## 2020-07-24 ENCOUNTER — Other Ambulatory Visit: Payer: Self-pay | Admitting: Family Medicine

## 2020-07-24 DIAGNOSIS — J432 Centrilobular emphysema: Secondary | ICD-10-CM

## 2020-07-24 DIAGNOSIS — B370 Candidal stomatitis: Secondary | ICD-10-CM

## 2020-07-24 NOTE — Telephone Encounter (Signed)
last RF 07/23/20 #7 1 RF

## 2020-07-24 NOTE — Telephone Encounter (Signed)
Notes to clinic:  Looks like medications were sent yesterday Decline is appropriate   Requested Prescriptions  Pending Prescriptions Disp [place] 100-62.5-25 MCG/INH AEPB [Pharmacy Med Name: TRELEGY ELLIPTA 100-62.5MCG INH 30P] 60 each 11    Sig: Inhale 1 puff into the lungs daily.      Off-Protocol Failed - 07/24/2020  1:02 PM      Failed - Medication not assigned to a protocol, review manually.      Passed - Valid encounter within last 12 months    Recent Outpatient Visits           Yesterday Centrilobular emphysema [facility])   [place], DO   5 days ago No-show for appointment   [facility], Devonne Doughty, DO   2 weeks ago Centrilobular emphysema [facility])   [place], DO       Future Appointments             In 4 weeks [place], Devonne Doughty, DO [facility], [facility]               fluconazole (DIFLUCAN) 200 MG tablet [Pharmacy Med Name: FLUCONAZOLE 200MG  TABLETS] 7 tablet 1    Sig: TAKE 1 TABLET(200 MG) BY MOUTH DAILY FOR 7 DAYS. MAY REPEAT COURSE IF UNRESOLVED WITH REFILL      Off-Protocol Failed - 07/24/2020  1:02 PM      Failed - Medication not assigned to a protocol, review manually.      Passed - Valid encounter within last 12 months    Recent Outpatient Visits           Yesterday Centrilobular emphysema [facility])   [place], DO   5 days ago No-show for appointment   [place], DO   2 weeks ago Centrilobular emphysema [facility])   [facility] [place], Devonne Doughty, DO       Future Appointments             In 4 weeks [place], Devonne Doughty, DO [facility] [place] 100-62.5-25 MCG/INH AEPB [Pharmacy Med Name: TRELEGY ELLIPTA 100-62.5MCG INH 30P] 60 each 11    Sig: Inhale  1 puff into the lungs daily.      Off-Protocol Failed - 07/24/2020  1:02 PM      Failed - Medication not assigned to a protocol, review manually.      Passed - Valid encounter within last 12 months    Recent Outpatient Visits           Yesterday Centrilobular emphysema [facility])   [place], DO   5 days ago No-show for appointment   [facility], Devonne Doughty, DO   2 weeks ago Centrilobular emphysema [facility])   [place], DO       Future Appointments             In 4 weeks [place], Devonne Doughty, DO [facility], [facility]               fluconazole (DIFLUCAN) 200 MG tablet [Pharmacy Med Name: FLUCONAZOLE 200MG  TABLETS] 7 tablet 1    Sig: TAKE 1 TABLET(200 MG) BY MOUTH DAILY FOR 7 DAYS. MAY REPEAT  COURSE IF UNRESOLVED WITH REFILL      Off-Protocol Failed - 07/24/2020  1:02 PM      Failed - Medication not assigned to a protocol, review manually.      Passed - Valid encounter within last 12 months    Recent Outpatient Visits           Yesterday Centrilobular emphysema [facility])   [place], DO   5 days ago No-show for appointment   [place], DO   2 weeks ago Centrilobular emphysema [facility])   [facility] [place], Devonne Doughty, DO       Future Appointments             In 4 weeks [place], Devonne Doughty, DO [facility], [facility]

## 2020-07-27 ENCOUNTER — Encounter: Payer: Self-pay | Admitting: Family Medicine

## 2020-07-27 DIAGNOSIS — R32 Unspecified urinary incontinence: Secondary | ICD-10-CM | POA: Insufficient documentation

## 2020-07-29 DIAGNOSIS — G894 Chronic pain syndrome: Secondary | ICD-10-CM | POA: Diagnosis not present

## 2020-07-29 DIAGNOSIS — M25561 Pain in right knee: Secondary | ICD-10-CM | POA: Diagnosis not present

## 2020-07-29 DIAGNOSIS — M961 Postlaminectomy syndrome, not elsewhere classified: Secondary | ICD-10-CM | POA: Diagnosis not present

## 2020-08-06 ENCOUNTER — Ambulatory Visit (INDEPENDENT_AMBULATORY_CARE_PROVIDER_SITE_OTHER): Payer: Medicare Other | Admitting: Family Medicine

## 2020-08-06 ENCOUNTER — Other Ambulatory Visit: Payer: Self-pay

## 2020-08-06 ENCOUNTER — Encounter: Payer: Self-pay | Admitting: Family Medicine

## 2020-08-06 VITALS — BP 125/70 | HR 124 | Ht 66.0 in | Wt 250.0 lb

## 2020-08-06 DIAGNOSIS — F331 Major depressive disorder, recurrent, moderate: Secondary | ICD-10-CM

## 2020-08-06 MED ORDER — BUPROPION HCL ER (XL) 150 MG PO TB24: 150.0000 mg | ORAL_TABLET | Freq: Every day | ORAL | 2 refills | Status: DC

## 2020-08-06 NOTE — Patient Instructions (Addendum)
Thank you for coming to the office today.  Keep on current meds including CItalopram  Added new med Wellbutrin XL '150mg'$  daily, to help with depression may take 1-2 weeks to take effect, take in morning.  Future we can consider phasing off Citalopram and on to something else such as Zoloft, Cymbalta Lexapro or other if needed  We can consider psychiatry as well if need other input.  Please schedule a Follow-up Appointment to: Return in about 6 weeks (around 09/17/2020) for re-schedule 8/8 for 6 weeks from now, follow-up DM updates, COPD/pulm?, Mood/meds.  If you have any other questions or concerns, please feel free to call the office or send a message through [place]. You may also schedule an earlier appointment if necessary.  Additionally, you may be receiving a survey about your experience at our office within a few days to 1 week by e-mail or mail. We value your feedback.  Nobie Putnam, DO [place]

## 2020-08-06 NOTE — Progress Notes (Signed)
Subjective:    Patient ID: [name], female    DOB: July 09, 1965, 55 y.o.   MRN: [id number]  [name] is a 55 y.o. female presenting on 08/06/2020 for Depression  Here with husband.  HPI  Major Depression, recurrent moderate Insomnia Anxiety, panic attacks  Last visit with me 07/10/20 initial visit Previously on Alprazolam in past, came off due to opiate pain medication, has been off for years. - Followed by therapist - Burnett Kanaris On Citalopram '40mg'$  daily - we renewed this last visit 6/28, she has been on Citalopram since her grandfather passed away about 14-15 years ago.  Interval update, her therapist Corene Cornea (through [place], through pain management) recommended to speak with me about medication change for depression  Today she reports feeling that her quality of life is declined and she feels down in a "funk"  She does get joy when she is with her grandchildren and they help her mood.  She admits insomnia difficulty sleeping. She tried Trazodone '150mg'$  x 2 weeks at top dose, from prior PCP. Says sleep is primarily difficult due to pain   Depression screen [facility] 2/9 08/06/2020 07/10/2020  Decreased Interest 1 2  Down, Depressed, Hopeless 1 1  PHQ - 2 Score 2 3  Altered sleeping 1 3  Tired, decreased energy 1 3  Change in appetite 1 0  Feeling bad or failure about yourself  1 0  Trouble concentrating 1 0  Moving slowly or fidgety/restless 0 0  Suicidal thoughts 0 0  PHQ-9 Score 7 9  Difficult doing work/chores Not difficult at all Not difficult at all   GAD 7 : Generalized Anxiety Score 08/06/2020 07/10/2020  Nervous, Anxious, on Edge 1 1  Control/stop worrying 1 1  Worry too much - different things 1 1  Trouble relaxing 1 3  Restless 0 1  Easily annoyed or irritable 0 3  Afraid - awful might happen 0 0  Total GAD 7 Score 4 10  Anxiety Difficulty Not difficult at all Somewhat difficult      Social History   Tobacco Use   Smoking status:  Every Day    Packs/day: 1.50    Years: 20.00    Pack years: 30.00    Types: Cigarettes   Smokeless tobacco: Never  Vaping Use   Vaping Use: Former  Substance Use Topics   Alcohol use: No   Drug use: Yes    Types: Marijuana    Comment: last smoked 2 days ago  8/4    Review of Systems Per HPI unless specifically indicated above     Objective:    BP 125/70   Pulse (!) 124   Ht '5\' 6"'$  (1.676 m)   Wt 250 lb (113.4 kg)   SpO2 96%   BMI 40.35 kg/m   Wt Readings from Last 3 Encounters:  08/06/20 250 lb (113.4 kg)  07/23/20 255 lb (115.7 kg)  07/10/20 255 lb (115.7 kg)    Physical Exam Vitals and nursing note reviewed.  Constitutional:      General: She is not in acute distress.    Appearance: Normal appearance. She is well-developed. She is obese. She is not diaphoretic.     Comments: Well-appearing, comfortable, cooperative  HENT:     Head: Normocephalic and atraumatic.  Eyes:     General:        Right eye: No discharge.        Left eye: No discharge.  Conjunctiva/sclera: Conjunctivae normal.  Cardiovascular:     Rate and Rhythm: Normal rate.  Pulmonary:     Effort: Pulmonary effort is normal.  Skin:    General: Skin is warm and dry.     Findings: No erythema or rash.  Neurological:     Mental Status: She is alert and oriented to person, place, and time.  Psychiatric:        Mood and Affect: Mood normal.        Behavior: Behavior normal.        Thought Content: Thought content normal.     Comments: Well groomed, good eye contact, normal speech and thoughts   Results for orders placed or performed during the hospital encounter of 06/09/20  Resp Panel by RT-PCR (Flu A&B, Covid) Nasopharyngeal Swab   Specimen: Nasopharyngeal Swab; Nasopharyngeal(NP) swabs in vial transport medium  Result Value Ref Range   SARS Coronavirus 2 by RT PCR NEGATIVE NEGATIVE   Influenza A by PCR NEGATIVE NEGATIVE   Influenza B by PCR NEGATIVE NEGATIVE      Assessment & Plan:    Problem List Items Addressed This Visit     Moderate episode of recurrent major depressive disorder ([place]) - Primary   Relevant Medications   buPROPion (WELLBUTRIN XL) 150 MG 24 hr tablet    Clinical depression, major recurrent chronic - moderate severity Associated with anxiety, insomnia, chronic pain Followed by Therapist currently  Continue chronic SSRI Citalopram '40mg'$  daily, for past 14-15 years, seems no other SSRI or SNRI tried, but she is hesitant to change this one, has tried coming off in past and did not do well.  Will add adjunct therapy with Wellbutrin XL '150mg'$  daily today, counsel on benefits side effect, may adjust dose in future, may take 1-2 weeks to take effect or longer possibly.  Consider Trazodone for mood/insomnia, has already tried up to '150mg'$  dose in past, however she started at high dose and stopped after 2 weeks. Counseling may reconsider low dose titration appropriate time interval.  Consider taper off SSRI Citalopram and switch to other SSRI or SNRI such as Zoloft, Cymbalta etc. We can consider this in future  Or Referral to Psychiatry.  Meds ordered this encounter  Medications   buPROPion (WELLBUTRIN XL) 150 MG 24 hr tablet    Sig: Take 1 tablet (150 mg total) by mouth daily.    Dispense:  30 tablet    Refill:  2      Follow up plan: Return in about 6 weeks (around 09/17/2020) for re-schedule 8/8 for 6 weeks from now, follow-up DM updates, COPD/pulm?, Mood/meds.   Nobie Putnam, [facility] 08/06/2020, 3:04 PM

## 2020-08-08 ENCOUNTER — Encounter: Payer: Self-pay | Admitting: Family Medicine

## 2020-08-08 DIAGNOSIS — K219 Gastro-esophageal reflux disease without esophagitis: Secondary | ICD-10-CM

## 2020-08-09 MED ORDER — PANTOPRAZOLE SODIUM 40 MG PO TBEC: 40.0000 mg | DELAYED_RELEASE_TABLET | Freq: Every day | ORAL | 3 refills | Status: DC

## 2020-08-09 NOTE — Addendum Note (Signed)
Addended by: Olin Hauser on: 08/09/2020 12:47 PM   Modules accepted: Orders

## 2020-08-17 ENCOUNTER — Telehealth: Payer: Self-pay | Admitting: Pharmacist

## 2020-08-17 NOTE — Telephone Encounter (Signed)
  Chronic Care Management   Outreach Note  08/17/2020 Name: [name] MRN: [id number] DOB: 04/09/1965  Referred by: Olin Hauser, DO Reason for referral : No chief complaint on file.   The CCM team was consulted for assistance with chronic disease management and care coordination needs related to medication adherence.   I reached out to patient by phone today to discuss patient's medication adherence to atorvastatin. From review of chart, note atorvastatin 40 mg Rx last refilled for 90 day supply on 05/02/2020.    Was unable to reach patient via telephone today and have left HIPAA compliant voicemail asking patient to return my call.      Follow Up Plan: CM Pharmacist will attempt to reach patient by phone again within the next 14 days   Wallace Cullens, PharmD, [facility] [phone]

## 2020-08-17 NOTE — Telephone Encounter (Signed)
  Chronic Care Management   Outreach Note  08/17/2020 Name: [name] MRN: [id number] DOB: 1965-01-25  Referred by: Olin Hauser, DO Reason for referral : No chief complaint on file.   Receive call back from patient. Reports she had a death in the family last night and would prefer to speak next week.  Follow Up Plan: CM Pharmacist will outreach to patient again by telephone next week.  Harlow Asa, PharmD, [place] [facility] [phone]

## 2020-08-20 ENCOUNTER — Telehealth: Payer: Self-pay

## 2020-08-20 DIAGNOSIS — Z720 Tobacco use: Secondary | ICD-10-CM | POA: Diagnosis not present

## 2020-08-20 DIAGNOSIS — Z79891 Long term (current) use of opiate analgesic: Secondary | ICD-10-CM | POA: Diagnosis not present

## 2020-08-20 DIAGNOSIS — I69351 Hemiplegia and hemiparesis following cerebral infarction affecting right dominant side: Secondary | ICD-10-CM | POA: Diagnosis not present

## 2020-08-20 DIAGNOSIS — M961 Postlaminectomy syndrome, not elsewhere classified: Secondary | ICD-10-CM | POA: Diagnosis not present

## 2020-08-20 DIAGNOSIS — Z87891 Personal history of nicotine dependence: Secondary | ICD-10-CM | POA: Diagnosis not present

## 2020-08-20 DIAGNOSIS — Z029 Encounter for administrative examinations, unspecified: Secondary | ICD-10-CM | POA: Diagnosis not present

## 2020-08-21 ENCOUNTER — Ambulatory Visit: Payer: Medicare Other | Admitting: Family Medicine

## 2020-08-22 ENCOUNTER — Ambulatory Visit (INDEPENDENT_AMBULATORY_CARE_PROVIDER_SITE_OTHER): Payer: Medicare Other | Admitting: Pharmacist

## 2020-08-22 ENCOUNTER — Encounter: Payer: Self-pay | Admitting: Family Medicine

## 2020-08-22 DIAGNOSIS — K219 Gastro-esophageal reflux disease without esophagitis: Secondary | ICD-10-CM

## 2020-08-22 DIAGNOSIS — F331 Major depressive disorder, recurrent, moderate: Secondary | ICD-10-CM

## 2020-08-22 DIAGNOSIS — J432 Centrilobular emphysema: Secondary | ICD-10-CM | POA: Diagnosis not present

## 2020-08-22 DIAGNOSIS — E559 Vitamin D deficiency, unspecified: Secondary | ICD-10-CM | POA: Insufficient documentation

## 2020-08-22 DIAGNOSIS — Z794 Long term (current) use of insulin: Secondary | ICD-10-CM | POA: Diagnosis not present

## 2020-08-22 DIAGNOSIS — E1169 Type 2 diabetes mellitus with other specified complication: Secondary | ICD-10-CM | POA: Diagnosis not present

## 2020-08-22 NOTE — Chronic Care Management (AMB) (Signed)
Chronic Care Management Pharmacy Note  08/22/2020 Name:  [name] MRN:  [id number] DOB:  [birth year]   Subjective: [name] is an 55 y.o. year old female who is a primary patient of Olin Hauser, DO.  The CCM team was consulted for assistance with disease management and care coordination needs.    Engaged with patient by telephone for follow up visit in response to provider referral for pharmacy case management and/or care coordination services.   Consent to Services:  The patient was given information about Chronic Care Management services, agreed to services, and gave verbal consent prior to initiation of services.  Please see initial visit note for detailed documentation.   Patient Care Team: Olin Hauser, DO as PCP - General (Family Medicine) Curley Spice Virl Diamond, RPH-CPP as Pharmacist  Recent office visits: Office Visit with PCP on 7/25 for depression  Hospital visits: None in previous 6 months  Objective:  Lab Results  Component Value Date   CREATININE 0.71 09/23/2017   CREATININE 0.82 08/18/2017   CREATININE 0.76 08/09/2017    Lab Results  Component Value Date   HGBA1C 9.2 (H) 08/09/2017  Latest A1C per shared record with [facility]: 12.5% on 04/23/2020  Last diabetic Eye exam: No results found for: HMDIABEYEEXA  Last diabetic Foot exam: No results found for: HMDIABFOOTEX      Component Value Date/Time   CHOL 136 04/07/2017 0828   CHOL 207 (H) 10/07/2012 0423   TRIG 191 (H) 04/07/2017 0828   TRIG 273 (H) 10/07/2012 0423   HDL 31 (L) 04/07/2017 0828   HDL 25 (L) 10/07/2012 0423   CHOLHDL 4.4 04/07/2017 0828   VLDL 38 04/07/2017 0828   VLDL 55 (H) 10/07/2012 0423   LDLCALC 67 04/07/2017 0828   LDLCALC 127 (H) 10/07/2012 0423  Latest LDL per shared record with [place] [facility]: 126 mg/dL on 04/23/2020  Lab Results  Component Value Date/Time   VD25OH 20.0 (L) 08/18/2017 07:29 AM     Clinical ASCVD: Yes  The ASCVD Risk score Mikey Bussing DC Jr., et al., 2013) failed to calculate for the following reasons:   Cannot find a previous HDL lab   Cannot find a previous total cholesterol lab     Social History   Tobacco Use  Smoking Status Every Day   Packs/day: 1.50   Years: 20.00   Pack years: 30.00   Types: Cigarettes  Smokeless Tobacco Never   BP Readings from Last 3 Encounters:  08/06/20 125/70  07/10/20 (!) 143/83  06/12/20 105/65   Pulse Readings from Last 3 Encounters:  08/06/20 (!) 124  07/10/20 96  06/12/20 93   Wt Readings from Last 3 Encounters:  08/06/20 250 lb (113.4 kg)  07/23/20 255 lb (115.7 kg)  07/10/20 255 lb (115.7 kg)    Assessment: Review of patient past medical history, allergies, medications, health status, including review of consultants reports, laboratory and other test data, was performed as part of comprehensive evaluation and provision of chronic care management services.   SDOH:  (Social Determinants of Health) assessments and interventions performed: yes SDOH Interventions    Flowsheet Row Most Recent Value  SDOH Interventions   SDOH Interventions for the Following Domains Physical Activity  Physical Activity Interventions Other (Comments)  [Encouraged patient to follow up for physical therapy referral to help with ability to exercise]       CCM Care Plan  Allergies  Allergen Reactions   Tramadol Nausea And  Vomiting and Hypertension   Augmentin [Amoxicillin-Pot Clavulanate] Rash    Medications Reviewed Today     Reviewed by Rennis Petty, RPH-CPP (Pharmacist) on 08/22/20 at 1548  Med List Status: <None>   Medication Order Taking? Sig Documenting Provider Last Dose Status Informant  albuterol (VENTOLIN HFA) 108 (90 Base) MCG/ACT inhaler [id number] Yes Inhale 2 puffs into the lungs every 4 (four) hours as needed for wheezing or shortness of breath. Olin Hauser, DO Taking Active   atorvastatin  (LIPITOR) 40 MG tablet [id number] Yes Take 40 mg by mouth at bedtime. [provider] Taking Active   buPROPion (WELLBUTRIN XL) 150 MG 24 hr tablet [id number] Yes Take 1 tablet (150 mg total) by mouth daily. Olin Hauser, DO Taking Active   citalopram (CELEXA) 40 MG tablet [id number] Yes Take 1 tablet (40 mg total) by mouth daily. Olin Hauser, DO Taking Active   clopidogrel (PLAVIX) 75 MG tablet [id number] Yes Take 75 mg by mouth daily. [provider] Taking Active Pharmacy Records           Med Note Stan Head, Ceasar Lund Aug 12, 2017  6:52 PM) Has not started it back since injury.  fluconazole (DIFLUCAN) 200 MG tablet [id number] No Take 1 tablet (200 mg total) by mouth daily. For 7 days, may repeat course if unresolved w/ refill  Patient not taking: Reported on 08/22/2020   Olin Hauser, DO Not Taking Active   fluticasone [facility]) 50 MCG/ACT nasal spray [id number] Yes Place 2 sprays into both nostrils daily. Verda Cumins, MD Taking Active   gabapentin (NEURONTIN) 600 MG tablet [id number] Yes Take 600 mg by mouth 4 (four) times daily. [provider] Taking Active   insulin degludec (TRESIBA FLEXTOUCH) 200 UNIT/ML FlexTouch Pen [id number] Yes Inject 14 Units into the skin at bedtime. [provider] Taking Active   linagliptin (TRADJENTA) 5 MG TABS tablet [id number] Yes Take 5 mg by mouth daily. [provider] Taking Active Pharmacy Records  metFORMIN (GLUCOPHAGE) 1000 MG tablet [id number] Yes Take 1,000 mg by mouth 2 (two) times daily with a meal. [provider] Taking Active   nystatin (MYCOSTATIN/NYSTOP) powder [id number] Yes Apply 1 g topically 2 (two) times daily.  [provider] Taking Active Pharmacy Records  Oxycodone HCl 10 MG TABS [id number] Yes Take 1 tablet (10 mg total) by mouth 3 (three) times daily as needed (pain). Olin Hauser, DO Taking Active   pantoprazole (PROTONIX) 40  MG tablet [id number] Yes Take 1 tablet (40 mg total) by mouth daily before breakfast. Olin Hauser, DO Taking Active   pioglitazone (ACTOS) 45 MG tablet [id number] Yes Take 45 mg by mouth daily. [provider] Taking Active Pharmacy Records  topiramate (TOPAMAX) 100 MG tablet [id number] Yes Take 1 tablet (100 mg total) by mouth 2 (two) times daily. Olin Hauser, DO Taking Active   TRELEGY ELLIPTA 100-62.5-25 MCG/INH AEPB [id number] Yes Inhale 1 puff into the lungs daily. Olin Hauser, DO Taking Active   Vitamin D, Ergocalciferol, (DRISDOL) 50000 units CAPS capsule [id number] No Take 50,000 Units by mouth every 7 (seven) days. On Wednesday  Patient not taking: Reported on 08/22/2020   [provider] Not Taking Active Pharmacy Records  XTAMPZA ER 9 [facility] C12A [id number] Yes Take 1 capsule by mouth every 12 (twelve) hours. [provider] Taking Active             Patient Active Problem List   Diagnosis Date  Noted   Urinary incontinence 07/27/2020   Centrilobular emphysema ([place]) 07/10/2020   Moderate episode of recurrent major depressive disorder ([place]) 07/10/2020   Chronic pain syndrome 07/10/2020   Intractable chronic migraine without aura and with status migrainosus 07/10/2020   Chronic hypoxemic respiratory failure ([place]) 09/23/2017   Pulmonary nodules 09/23/2017   Trimalleolar fracture 08/09/2017   TIA (transient ischemic attack) 04/07/2017   Anxiety 04/07/2017   History of cerebrovascular accident (CVA) with residual deficit    Complicated migraine    Brain TIA 10/11/2012   Hyperlipidemia 10/11/2012   Nocturnal hypoxemia 10/11/2012   Type 2 diabetes mellitus with other specified complication ([place]) [id number]   Tobacco abuse 10/11/2012   COPD (chronic obstructive pulmonary disease) ([place]) 10/11/2012    Immunization History  Administered Date(s) Administered   Influenza, Seasonal, Injecte, Preservative Fre 09/23/2007    Influenza,inj,Quad PF,6+ Mos 10/09/2014   Pneumococcal Polysaccharide-23 08/18/2007, 10/13/2012    Conditions to be addressed/monitored: COPD, depression, T2DM, HLD, tobacco abuse  Care Plan : PharmD - Medication Adherence/T2DM Management  Updates made by Rennis Petty, RPH-CPP since 08/22/2020 12:00 AM     Problem: Disease Progression      Long-Range Goal: Disease Progression Prevented or Minimized   Start Date: 08/22/2020  Expected End Date: 11/20/2020  This Visit's Progress: On track  Priority: High  Note:   Current Barriers:  Unable to achieve control of A1C  Lack of blood sugar results for clinical team  Pharmacist Clinical Goal(s):  Over the next 90 days, patient will achieve adherence to monitoring guidelines and medication adherence to achieve therapeutic efficacy through collaboration with PharmD and provider.    Interventions: 1:1 collaboration with Olin Hauser, DO regarding development and update of comprehensive plan of care as evidenced by provider attestation and co-signature Inter-disciplinary care team collaboration (see longitudinal plan of care) Perform chart review. Patient seen for Office Visit with PCP on 7/25 for depression. Provider advised patient: Start Wellbutrin XL '150mg'$  daily, to help with depression may take 1-2 weeks to take effect, take in morning Comprehensive medication review performed; medication list updated in electronic medical record Reports starting to see improvement in mood over past few days - still having trouble with sleep, but also is better. Attributes change to addition of bupropion ER 150 mg daily to her citalopram 40 mg daily by PCP. Caution patient for dizziness and sedation with medications including Xtampza ER, oxycodone IR, gabapentin and topiramate, particularly when taken in combination Patient denies having trouble with dizziness or sedation Reports Xtampza ER and oxycodone IR managed by Pain Management  Specialist Reports taking topiramate for migraine prevention Reports history of Vitamin D deficiency and previously on Vitamin D2 50,000 IU every 7 days as prescribed by previous PCP until ~1 month ago Reports planning to follow up with PCP at upcoming visit to request follow up Vitamin D level Will collaborate with PCP  Type 2 Diabetes: Uncontrolled; current treatment: Metformin 1000 mg twice daily Tradjenta 5 mg daily Tresiba 14 units into skin daily at bedtime Denies recently monitoring home blood sugar Encourage patient to restart home blood sugar monitoring Counsel on techniques to improve comfort with blood sugar checks Denies hypoglycemic symptoms Current meal patterns: Breakfast: bacon, egg and cheese biscuit; lunch: often skips; supper: chicken pot pie or baked chicken/pork chop with beans or green beans; late night snack: ice cream at bedtime; drinks: Diet Pepsi, sweet tea and some water Reports has been working on drinking more water, rather than soda and tea Encourage  patient to review nutrition labels for carbohydrate content Discuss impact of diet and exercise on blood sugar control Encourage patient to work on limiting carbohydrate portion sizes and to work on having regular well-balanced meals throughout the day Encourage patient to try Glucerna supplement as reports often has difficulty in eating breakfast Current exercise: Reports currently limited by right side deficit due to stroke and previous injury to right ankle from a fall Reports planning to follow up with ankle surgeon to request further physical therapy for ankle  Hyperlipidemia/Medication Adherence: Uncontrolled Assess adherence to atorvastatin 40 mg?(as identified by health plan).   Reports missing ~ 1 dose/week  Review dispensing history in chart. Rx last refilled for 90 day supply on 05/02/2020.  Counsel patient on importance of medication adherence and advise to start using weekly pillbox Counsel on using  health plan OTC benefit to obtain weekly pillbox Patient reports will order and start using   Chronic Obstructive Pulmonary Disease: Current treatment: Trelegy - 1 puff daily Albuterol as needed Confirms using Trelegy daily consistently and rinsing out mouth after each use   Patient Goals/Self-Care Activities Over the next 90 days, patient will:  - focus on medication adherence by using weekly pillbox - check glucose, document, and provide at future appointments - attend medical appointment as scheduled  Next appointment with PCP on 9/6  Follow Up Plan: Telephone follow up appointment with care management team member scheduled for: 10/08/2020 at 2:30 PM      Patient's preferred pharmacy is:  [facility] [place], [place] [address] Phone: [phone] Fax: [phone]   Follow Up:  Patient agrees to Care Plan and Follow-up.  Wallace Cullens, PharmD, [place], CPP Clinical Pharmacist [facility] [phone]

## 2020-08-22 NOTE — Patient Instructions (Signed)
Visit Information  PATIENT GOALS:  Goals Addressed             This Visit's Progress    Pharmacy Goals       Our goal A1c is less than 7%. This corresponds with fasting sugars less than 130 and 2 hour after meal sugars less than 180. Please check your blood sugar, keep a log of the results and have this for [place] to review during our next telephone appointment  Our goal bad cholesterol, or LDL, is less than 70 . This is why it is important to continue taking your atorvastatin   Feel free to call me with any questions or concerns. I look forward to our next call!  Wallace Cullens, PharmD, [place], CPP Clinical Pharmacist [facility] [phone]        The patient verbalized understanding of instructions, educational materials, and care plan provided today and declined offer to receive copy of patient instructions, educational materials, and care plan.   Telephone follow up appointment with care management team member scheduled for: 10/08/2020 at 2:30 PM

## 2020-08-24 ENCOUNTER — Telehealth: Payer: Self-pay | Admitting: Family Medicine

## 2020-08-24 NOTE — Telephone Encounter (Signed)
Left message for patient to call back and schedule Medicare Annual Wellness Visit (AWV) to be done virtually or by telephone.  No hx of AWV eligible as of 07/14/2018  Please schedule at anytime with [facility].      40 Minutes appointment   Any questions, please call me at [phone]

## 2020-08-29 ENCOUNTER — Encounter: Payer: Self-pay | Admitting: Family Medicine

## 2020-08-29 DIAGNOSIS — M961 Postlaminectomy syndrome, not elsewhere classified: Secondary | ICD-10-CM | POA: Diagnosis not present

## 2020-08-29 DIAGNOSIS — G894 Chronic pain syndrome: Secondary | ICD-10-CM | POA: Diagnosis not present

## 2020-08-29 DIAGNOSIS — M25561 Pain in right knee: Secondary | ICD-10-CM | POA: Diagnosis not present

## 2020-09-10 ENCOUNTER — Ambulatory Visit: Payer: Self-pay | Admitting: *Deleted

## 2020-09-10 NOTE — Telephone Encounter (Signed)
Husband has Covid. Provided patient with health interventions including frequent hand washing, both wearing mask if it's necessary to be near each other-he is her caretaker. Ensure disinfecting of all common areas often. First sign of symptoms call pcp. Monitor your breathing for any changes as she has COPD.      Reason for Disposition  Health Information question, no triage required and triager able to answer question  Answer Assessment - Initial Assessment Questions 1. REASON FOR CALL or QUESTION: "What is your reason for calling today?" or "How can I best help you?" or "What question do you have that I can help answer?"     Husband has Covid. Requesting information to help prevent getting Covid.  Protocols used: Information Only Call - No Triage-A-AH

## 2020-09-12 ENCOUNTER — Telehealth: Payer: Self-pay | Admitting: Family Medicine

## 2020-09-12 ENCOUNTER — Other Ambulatory Visit: Payer: Self-pay | Admitting: Family Medicine

## 2020-09-12 DIAGNOSIS — B370 Candidal stomatitis: Secondary | ICD-10-CM

## 2020-09-12 NOTE — Telephone Encounter (Signed)
Needs a RX for a new wheelchair / pt called insurance and was advised that the place to fax the Rx is [facility] / fax# [phone]/phone# [phone] / pt has had wheelchair for 8 years and wheel has come off/ please advise / pt stated she is a bigger lady and needs a bigger wheel chair

## 2020-09-12 NOTE — Telephone Encounter (Signed)
Requested medications are due for refill today.  unknown  Requested medications are on the active medications list.  yes  Last refill. 07/23/2020  Future visit scheduled.   yes  Notes to clinic.  No protocol assigned. Per note of 08/22/2020 pt is no longer taking this medication.

## 2020-09-12 NOTE — Telephone Encounter (Signed)
Typically in my experience all wheelchair orders require a "Mobility Visit" doctors visit in person or video virtual where we complete the mobility form and document why wheelchair is needed.  We will need them to provide any specifics for the wheelchair such as measurements, size, types of cushion/seat, leg rests, pads etc.  Usually it is a detailed order form based on the company. They usually do that and then bring [place] the documentation so we can complete the Mobility Assessement.  If they need [place] to do this visit first and then just write "Wheelchair" on a rx pad and send it that is fine too, then they would likely do the fitting and give [place] the specific order form.  So she needs an appointment, or we need the detailed order form.  Nobie Putnam, [facility] 09/12/2020, 6:42 PM

## 2020-09-18 ENCOUNTER — Ambulatory Visit: Payer: Medicare Other | Admitting: Family Medicine

## 2020-09-18 DIAGNOSIS — Z720 Tobacco use: Secondary | ICD-10-CM | POA: Diagnosis not present

## 2020-09-18 DIAGNOSIS — Z79891 Long term (current) use of opiate analgesic: Secondary | ICD-10-CM | POA: Diagnosis not present

## 2020-09-18 DIAGNOSIS — Z87891 Personal history of nicotine dependence: Secondary | ICD-10-CM | POA: Diagnosis not present

## 2020-09-18 DIAGNOSIS — M961 Postlaminectomy syndrome, not elsewhere classified: Secondary | ICD-10-CM | POA: Diagnosis not present

## 2020-09-18 DIAGNOSIS — M25561 Pain in right knee: Secondary | ICD-10-CM | POA: Diagnosis not present

## 2020-09-18 DIAGNOSIS — I69351 Hemiplegia and hemiparesis following cerebral infarction affecting right dominant side: Secondary | ICD-10-CM | POA: Diagnosis not present

## 2020-09-18 NOTE — Telephone Encounter (Signed)
Pt coming in for an appt tomorrow. Will discuss during that time.

## 2020-09-21 ENCOUNTER — Other Ambulatory Visit: Payer: Self-pay

## 2020-09-21 ENCOUNTER — Encounter: Payer: Self-pay | Admitting: Family Medicine

## 2020-09-21 ENCOUNTER — Ambulatory Visit (INDEPENDENT_AMBULATORY_CARE_PROVIDER_SITE_OTHER): Payer: Medicare Other | Admitting: Family Medicine

## 2020-09-21 VITALS — BP 122/71 | HR 103 | Temp 97.1°F | Resp 17 | Ht 66.0 in | Wt 255.0 lb

## 2020-09-21 DIAGNOSIS — I6930 Unspecified sequelae of cerebral infarction: Secondary | ICD-10-CM | POA: Diagnosis not present

## 2020-09-21 DIAGNOSIS — L97511 Non-pressure chronic ulcer of other part of right foot limited to breakdown of skin: Secondary | ICD-10-CM | POA: Diagnosis not present

## 2020-09-21 DIAGNOSIS — E11621 Type 2 diabetes mellitus with foot ulcer: Secondary | ICD-10-CM | POA: Diagnosis not present

## 2020-09-21 DIAGNOSIS — B379 Candidiasis, unspecified: Secondary | ICD-10-CM | POA: Diagnosis not present

## 2020-09-21 DIAGNOSIS — I69951 Hemiplegia and hemiparesis following unspecified cerebrovascular disease affecting right dominant side: Secondary | ICD-10-CM | POA: Diagnosis not present

## 2020-09-21 MED ORDER — CLINDAMYCIN HCL 300 MG PO CAPS: 300.0000 mg | ORAL_CAPSULE | Freq: Three times a day (TID) | ORAL | 0 refills | Status: DC

## 2020-09-21 MED ORDER — LEVOFLOXACIN 500 MG PO TABS: 500.0000 mg | ORAL_TABLET | Freq: Every day | ORAL | 0 refills | Status: AC

## 2020-09-21 MED ORDER — MUPIROCIN 2 % EX OINT: 1.0000 "application " | TOPICAL_OINTMENT | Freq: Two times a day (BID) | CUTANEOUS | 0 refills | Status: DC | PRN

## 2020-09-21 MED ORDER — FLUCONAZOLE 150 MG PO TABS: ORAL_TABLET | ORAL | 1 refills | Status: DC

## 2020-09-21 NOTE — Patient Instructions (Addendum)
Thank you for coming to the office today.  Start antibiotic and medications for toe  Stop neosporin, start bactroban Can continue soaks as you are in evening  Will fax wheelchair rx to [facility], stay tuned for updates.  If not improving you may need to return for re-evaluation. But if more severe worsening such as spreading redness or streaking redness, significantly larger size, persistent drainage of pus, increased pain, fevers/chills, nausea vomiting and cannot take antibiotic. If significantly worse symptoms or most of these symptoms, would recommend going straight to Hospital Emergency Dept as you may require IV antibiotics instead.   Please schedule a Follow-up Appointment to: Return if symptoms worsen or fail to improve.  If you have any other questions or concerns, please feel free to call the office or send a message through [place]. You may also schedule an earlier appointment if necessary.  Additionally, you may be receiving a survey about your experience at our office within a few days to 1 week by e-mail or mail. We value your feedback.  Nobie Putnam, DO [place]

## 2020-09-21 NOTE — Progress Notes (Signed)
Subjective:    Patient ID: [name], female    DOB: [birth year], 55 y.o.   MRN: [id number]  [name] is a 55 y.o. female presenting on 09/21/2020 for Nail Problem (Rt great toenail fungus x3 weeks ) and History of stroke (Pt requesting a new wheeler chair)   HPI   Right Great Toe, infection Diabetic toe ulcer Since mid 08/2020, has had persistent issue, non healing ulceration scab. Now some improved. Using topical antibiotic neosporin, warm water soaks. Not on antibiotic. No fever chills drainage of pus no spreading redness.  Right sided weakness History of CVA   Wheelchair Mobility Assessment  - Patient has difficulty with transferring - from one area in house to other, and from out of house transfer to car and out and to outside locations such as doctors office or store.  She has limited mobility due to history of stroke CVA in past with residual R sided upper and lower extremity hemiparesis weakness.  - Unable to use cane or walker regularly due to weakness with reduced strength 3 out of 5 lower extremity and 4 out of 5 upper extremity Right sided. Risk of falling due to assisted device, due to weakness and lack of support  She is capable of using manual wheelchair due to maintained strength in Left upper extremity and has family to support with mobility pushing wheelchair.       Depression screen [facility] 2/9 08/06/2020 07/10/2020  Decreased Interest 1 2  Down, Depressed, Hopeless 1 1  PHQ - 2 Score 2 3  Altered sleeping 1 3  Tired, decreased energy 1 3  Change in appetite 1 0  Feeling bad or failure about yourself  1 0  Trouble concentrating 1 0  Moving slowly or fidgety/restless 0 0  Suicidal thoughts 0 0  PHQ-9 Score 7 9  Difficult doing work/chores Not difficult at all Not difficult at all    Social History   Tobacco Use   Smoking status: Every Day    Packs/day: 1.50    Years: 20.00    Pack years: 30.00    Types: Cigarettes   Smokeless tobacco:  Never  Vaping Use   Vaping Use: Former  Substance Use Topics   Alcohol use: No   Drug use: Yes    Types: Marijuana    Comment: last smoked 2 days ago  8/4    Review of Systems Per HPI unless specifically indicated above     Objective:    BP 122/71 (BP Location: Right Arm, Patient Position: Sitting, Cuff Size: Large)   Pulse (!) 103   Temp (!) 97.1 F (36.2 C) (Temporal)   Resp 17   Ht '5\' 6"'$  (1.676 m)   Wt 255 lb (115.7 kg)   SpO2 98%   BMI 41.16 kg/m   Wt Readings from Last 3 Encounters:  09/21/20 255 lb (115.7 kg)  08/06/20 250 lb (113.4 kg)  07/23/20 255 lb (115.7 kg)    -  O2 Saturation = Unavailable data. - Edema = negative - Pressure Sores = None actively - Ability to Shift Weight = Able to shift weight, but unable to transfer due to lack of strength  Physical Exam Vitals and nursing note reviewed.  Constitutional:      General: She is not in acute distress.    Appearance: Normal appearance. She is well-developed. She is obese. She is not diaphoretic.     Comments: Well-appearing, comfortable, cooperative  HENT:     Head:  Normocephalic and atraumatic.  Eyes:     General:        Right eye: No discharge.        Left eye: No discharge.     Conjunctiva/sclera: Conjunctivae normal.  Cardiovascular:     Rate and Rhythm: Normal rate.  Pulmonary:     Effort: Pulmonary effort is normal.  Musculoskeletal:     Right lower leg: No edema.     Left lower leg: No edema.     Comments: Wheelchair bound.  Skin:    General: Skin is warm and dry.     Findings: No erythema or rash.  Neurological:     Mental Status: She is alert and oriented to person, place, and time.  Psychiatric:        Mood and Affect: Mood normal.        Behavior: Behavior normal.        Thought Content: Thought content normal.     Comments: Well groomed, good eye contact, normal speech and thoughts    Upper and Lower Extremity Assessment RUE - Strength - 4 out of 5 - Pain - 2 out of 10 -  Range of Motion - Mostly full range   LUE - Strength - 5 out of 5 - Pain - 0 out of 10 - Range of Motion - mostly full range   RLE - Strength 3 out of 5 S/p hemiparesis weakness Right lower extremity - Pain - 3 out of 10 - Range of Motion - able to flex and extend, reduced range of motion   LLE - Strength - 4 out of 5 - Pain - 1 out of 10 - Range of Motion -full range   - Gait Pattern: Unable to assess. Non ambulatory. S/p hemiparesis weakness Right lower extremity  Results for orders placed or performed during the hospital encounter of 06/09/20  Resp Panel by RT-PCR (Flu A&B, Covid) Nasopharyngeal Swab   Specimen: Nasopharyngeal Swab; Nasopharyngeal(NP) swabs in vial transport medium  Result Value Ref Range   SARS Coronavirus 2 by RT PCR NEGATIVE NEGATIVE   Influenza A by PCR NEGATIVE NEGATIVE   Influenza B by PCR NEGATIVE NEGATIVE      Assessment & Plan:   Problem List Items Addressed This Visit     History of cerebrovascular accident (CVA) with residual deficit   Hemiparesis of right dominant side as late effect of cerebrovascular disease ([place])   Other Visit Diagnoses     Diabetic ulcer of toe of right foot associated with type 2 diabetes mellitus, limited to breakdown of skin (HCC)    -  Primary   Relevant Medications   levofloxacin (LEVAQUIN) 500 MG tablet   mupirocin ointment (BACTROBAN) 2 %   clindamycin (CLEOCIN) 300 MG capsule   Antibiotic-induced yeast infection       Relevant Medications   fluconazole (DIFLUCAN) 150 MG tablet   mupirocin ointment (BACTROBAN) 2 %   clindamycin (CLEOCIN) 300 MG capsule      Diabetic ulceration R great toe Improving Will cover with systemic antibiotics oral Clindamycin 300 TID x 7 days and levaquin 500 daily for 7 days, add topical Mupirocin ointment instead of neosporin. Return criteria given for follow-up Diflucan ordered for potential antibiotic yeast infection    Wheelchair Mobility Assessment See above HPI for  data regarding medical necessity for this device  She would benefit from manual wheelchair to improve mobility at home and transfers out of home, allow to perform ADLs, improve function, reduce fall  risk. She has limitations as documented with strength and weakness and ability to transfer that limit from other assisted devices such as cane or walker.  Patient has normal cognition, without memory loss or decline. They have the mental capacity to operate a manual wheelchair.  Handwritten rx today for manual wheelchair, wide, with cushioned leg rests, signed to be faxed to [facility] in [place]. Patient given rx as well.    ----------------------------------------------------   Additional Medical Conditions: - The primary medical condition that impacts patient's mobility need is status of hemiparesis weakness Right lower extremity following past cva stroke.   Meds ordered this encounter  Medications   levofloxacin (LEVAQUIN) 500 MG tablet    Sig: Take 1 tablet (500 mg total) by mouth daily for 7 days.    Dispense:  7 tablet    Refill:  0   DISCONTD: clindamycin (CLEOCIN) 300 MG capsule    Sig: Take 1 capsule (300 mg total) by mouth 3 (three) times daily.    Dispense:  21 capsule    Refill:  0   fluconazole (DIFLUCAN) 150 MG tablet    Sig: Take one tablet by mouth on Day 1. Repeat dose 2nd tablet on Day 3.    Dispense:  2 tablet    Refill:  1   mupirocin ointment (BACTROBAN) 2 %    Sig: Apply 1 application topically 2 (two) times daily as needed. For up to 7-10 days as needed for skin infection    Dispense:  22 g    Refill:  0   clindamycin (CLEOCIN) 300 MG capsule    Sig: Take 1 capsule (300 mg total) by mouth 3 (three) times daily.    Dispense:  21 capsule    Refill:  0      Follow up plan: Return if symptoms worsen or fail to improve.   Nobie Putnam, [facility] 09/21/2020, 4:25 PM

## 2020-09-26 DIAGNOSIS — I69951 Hemiplegia and hemiparesis following unspecified cerebrovascular disease affecting right dominant side: Secondary | ICD-10-CM | POA: Diagnosis not present

## 2020-09-29 DIAGNOSIS — M961 Postlaminectomy syndrome, not elsewhere classified: Secondary | ICD-10-CM | POA: Diagnosis not present

## 2020-09-29 DIAGNOSIS — G894 Chronic pain syndrome: Secondary | ICD-10-CM | POA: Diagnosis not present

## 2020-09-29 DIAGNOSIS — M25561 Pain in right knee: Secondary | ICD-10-CM | POA: Diagnosis not present

## 2020-10-01 ENCOUNTER — Ambulatory Visit: Payer: Medicare Other | Admitting: Family Medicine

## 2020-10-08 ENCOUNTER — Ambulatory Visit (INDEPENDENT_AMBULATORY_CARE_PROVIDER_SITE_OTHER): Payer: Medicare Other | Admitting: Pharmacist

## 2020-10-08 DIAGNOSIS — J432 Centrilobular emphysema: Secondary | ICD-10-CM

## 2020-10-08 DIAGNOSIS — E1169 Type 2 diabetes mellitus with other specified complication: Secondary | ICD-10-CM

## 2020-10-08 DIAGNOSIS — Z794 Long term (current) use of insulin: Secondary | ICD-10-CM

## 2020-10-08 NOTE — Patient Instructions (Signed)
Visit Information  PATIENT GOALS:  Goals Addressed             This Visit's Progress    Pharmacy Goals       Our goal A1c is less than 7%. This corresponds with fasting sugars less than 130 and 2 hour after meal sugars less than 180. Please check your blood sugar, keep a log of the results and have this for [place] to review during our next telephone appointment  Our goal bad cholesterol, or LDL, is less than 70 . This is why it is important to continue taking your atorvastatin  Please follow up with [place] [facility] for visit with a nutritionist as recommended by Endocrinologist  Feel free to call me with any questions or concerns. I look forward to our next call!  Wallace Cullens, PharmD, [place], CPP Clinical Pharmacist [facility] [phone]        The patient verbalized understanding of instructions, educational materials, and care plan provided today and declined offer to receive copy of patient instructions, educational materials, and care plan.   Telephone follow up appointment with care management team member scheduled for: 11/07/2020 at 1:15 PM

## 2020-10-08 NOTE — Chronic Care Management (AMB) (Signed)
Chronic Care Management Pharmacy Note  10/08/2020 Name:  [name] MRN:  [id number] DOB:  1965-12-02   Subjective: [name] is an 55 y.o. year old female who is a primary patient of Olin Hauser, DO.  The CCM team was consulted for assistance with disease management and care coordination needs.    Engaged with patient by telephone for follow up visit in response to provider referral for pharmacy case management and/or care coordination services.   Consent to Services:  The patient was given information about Chronic Care Management services, agreed to services, and gave verbal consent prior to initiation of services.  Please see initial visit note for detailed documentation.   Patient Care Team: Olin Hauser, DO as PCP - General (Family Medicine) Curley Spice Virl Diamond, RPH-CPP as Pharmacist  Recent office visits: Office Visit with PCP on 9/9  Hospital visits: None in previous 6 months  Objective:  Lab Results  Component Value Date   CREATININE 0.71 09/23/2017   CREATININE 0.82 08/18/2017   CREATININE 0.76 08/09/2017    Lab Results  Component Value Date   HGBA1C 9.2 (H) 08/09/2017   Last diabetic Eye exam: No results found for: HMDIABEYEEXA  Last diabetic Foot exam: No results found for: HMDIABFOOTEX      Component Value Date/Time   CHOL 136 04/07/2017 0828   CHOL 207 (H) 10/07/2012 0423   TRIG 191 (H) 04/07/2017 0828   TRIG 273 (H) 10/07/2012 0423   HDL 31 (L) 04/07/2017 0828   HDL 25 (L) 10/07/2012 0423   CHOLHDL 4.4 04/07/2017 0828   VLDL 38 04/07/2017 0828   VLDL 55 (H) 10/07/2012 0423   LDLCALC 67 04/07/2017 0828   LDLCALC 127 (H) 10/07/2012 0423    Hepatic Function Latest Ref Rng & Units 08/18/2017 04/07/2017 09/22/2013  Total Protein 6.5 - 8.1 g/dL 7.0 7.1 7.2  Albumin 3.5 - 5.0 g/dL 3.0(L) 3.3(L) 2.6(L)  AST 15 - 41 U/L $Remo'22 16 17  'WLXaV$ ALT 0 - 44 U/L 16 12(L) 16  Alk Phosphatase 38 - 126 U/L 59 64 78  Total Bilirubin 0.3 -  1.2 mg/dL 0.9 0.4 0.2    Social History   Tobacco Use  Smoking Status Every Day   Packs/day: 1.50   Years: 20.00   Pack years: 30.00   Types: Cigarettes  Smokeless Tobacco Never   BP Readings from Last 3 Encounters:  09/21/20 122/71  08/06/20 125/70  07/10/20 (!) 143/83   Pulse Readings from Last 3 Encounters:  09/21/20 (!) 103  08/06/20 (!) 124  07/10/20 96   Wt Readings from Last 3 Encounters:  09/21/20 255 lb (115.7 kg)  08/06/20 250 lb (113.4 kg)  07/23/20 255 lb (115.7 kg)    Assessment: Review of patient past medical history, allergies, medications, health status, including review of consultants reports, laboratory and other test data, was performed as part of comprehensive evaluation and provision of chronic care management services.   SDOH:  (Social Determinants of Health) assessments and interventions performed: none   CCM Care Plan  Allergies  Allergen Reactions   Tramadol Nausea And Vomiting and Hypertension   Augmentin [Amoxicillin-Pot Clavulanate] Rash    Medications Reviewed Today     Reviewed by Olin Hauser, DO (Physician) on 09/21/20 at [facility] List Status: <None>   Medication Order Taking? Sig Documenting Provider Last Dose Status Informant  albuterol (VENTOLIN HFA) 108 (90 Base) MCG/ACT inhaler [id number] Yes Inhale 2 puffs into the lungs every  4 (four) hours as needed for wheezing or shortness of breath. Olin Hauser, DO Taking Active   atorvastatin (LIPITOR) 40 MG tablet [id number] Yes Take 40 mg by mouth at bedtime. [provider] Taking Active   buPROPion (WELLBUTRIN XL) 150 MG 24 hr tablet [id number] Yes Take 1 tablet (150 mg total) by mouth daily. Olin Hauser, DO Taking Active   citalopram (CELEXA) 40 MG tablet [id number] Yes Take 1 tablet (40 mg total) by mouth daily. Olin Hauser, DO Taking Active   clopidogrel (PLAVIX) 75 MG tablet [id number] Yes Take 75 mg by mouth daily. [provider] Taking Active Pharmacy Records           Med Note Stan Head, Ceasar Lund Aug 12, 2017  6:52 PM) Has not started it back since injury.  gabapentin (NEURONTIN) 600 MG tablet [id number] Yes Take 600 mg by mouth 4 (four) times daily. [provider] Taking Active   insulin degludec (TRESIBA FLEXTOUCH) 200 UNIT/ML FlexTouch Pen [id number] Yes Inject 14 Units into the skin at bedtime. [provider] Taking Active   linagliptin (TRADJENTA) 5 MG TABS tablet [id number] Yes Take 5 mg by mouth daily. [provider] Taking Active Pharmacy Records  metFORMIN (GLUCOPHAGE) 1000 MG tablet [id number] Yes Take 1,000 mg by mouth 2 (two) times daily with a meal. [provider] Taking Active   nystatin (MYCOSTATIN/NYSTOP) powder [id number] Yes Apply 1 g topically 2 (two) times daily.  [provider] Taking Active Pharmacy Records  oxyCODONE (ROXICODONE) 15 MG immediate release tablet [id number] Yes Take 15 mg by mouth every 6 (six) hours as needed. [provider] Taking Active   pantoprazole (PROTONIX) 40 MG tablet [id number] Yes Take 1 tablet (40 mg total) by mouth daily before breakfast. Olin Hauser, DO Taking Active   pioglitazone (ACTOS) 45 MG tablet [id number] Yes Take 45 mg by mouth daily. [provider] Taking Active Pharmacy Records  topiramate (TOPAMAX) 100 MG tablet [id number] Yes Take 1 tablet (100 mg total) by mouth 2 (two) times daily. Olin Hauser, DO Taking Active   TRELEGY ELLIPTA 100-62.5-25 MCG/INH AEPB [id number] Yes Inhale 1 puff into the lungs daily. Olin Hauser, DO Taking Active   Vitamin D, Ergocalciferol, (DRISDOL) 50000 units CAPS capsule [id number] Yes Take 50,000 Units by mouth every 7 (seven) days. On Wednesday [provider] Taking Active             Patient Active Problem List   Diagnosis Date Noted   Hemiparesis of right dominant side as late effect of  cerebrovascular disease ([place]) 09/21/2020   Vitamin D deficiency 08/22/2020   Urinary incontinence 07/27/2020   Centrilobular emphysema ([place]) 07/10/2020   Moderate episode of recurrent major depressive disorder ([place]) 07/10/2020   Chronic pain syndrome 07/10/2020   Intractable chronic migraine without aura and with status migrainosus 07/10/2020   Chronic hypoxemic respiratory failure ([place]) 09/23/2017   Pulmonary nodules 09/23/2017   Trimalleolar fracture 08/09/2017   TIA (transient ischemic attack) 04/07/2017   Anxiety 04/07/2017   History of cerebrovascular accident (CVA) with residual deficit    Complicated migraine    Brain TIA 10/11/2012   Hyperlipidemia 10/11/2012   Nocturnal hypoxemia 10/11/2012   Type 2 diabetes mellitus with other specified complication ([place]) 00/34/9179   Tobacco abuse 10/11/2012   COPD (chronic obstructive pulmonary disease) ([place]) 10/11/2012    Immunization History  Administered Date(s) Administered   Influenza, Seasonal, Injecte, Preservative Fre 09/23/2007   Influenza,inj,Quad PF,6+  Mos 10/09/2014   Pneumococcal Polysaccharide-23 08/18/2007, 10/13/2012    Conditions to be addressed/monitored: COPD, depression, T2DM, HLD, tobacco abuse  Care Plan : PharmD - Medication Adherence/T2DM Management  Updates made by Rennis Petty, RPH-CPP since 10/08/2020 12:00 AM     Problem: Disease Progression      Long-Range Goal: Disease Progression Prevented or Minimized   Start Date: 08/22/2020  Expected End Date: 11/20/2020  Recent Progress: On track  Priority: High  Note:   Current Barriers:  Unable to achieve control of A1C  Lack of blood sugar results for clinical team  Pharmacist Clinical Goal(s):  Over the next 90 days, patient will achieve adherence to monitoring guidelines and medication adherence to achieve therapeutic efficacy through collaboration with PharmD and provider.    Interventions: 1:1 collaboration with Olin Hauser,  DO regarding development and update of comprehensive plan of care as evidenced by provider attestation and co-signature Inter-disciplinary care team collaboration (see longitudinal plan of care) Perform chart review. Patient seen for Office Visit with PCP on 9/9. Provider advised: Patient to start systemic antibiotics oral Clindamycin 300 TID x 7 days and levaquin 500 daily for 7 days, add topical Mupirocin ointment instead of neosporin for diabetic ulceration R great toe Diflucan ordered for potential antibiotic yeast infection Rx for wheelchair to be faxed to [facility] in [place] Today reports completed course of clindamycin and Levaquin and used mupirocin ointment as directed and toe was doing better until yesterday when stubbed toe again.  Reports planning to follow up with PCP on 9/28  Type 2 Diabetes: Uncontrolled; current treatment: Metformin 1000 mg twice daily Tradjenta 5 mg daily Tresiba 14 units into skin daily at bedtime Pioglitazone 45 mg daily Reports has been monitoring home blood sugars but does not have record to review today.  Recalls recent non-fasting blood sugars ranging: 300s-400s Encourage patient to check morning fasting readings Discuss impact of diet and exercise on blood sugar control Encourage patient to work on limiting carbohydrate portion sizes and to work on having regular well-balanced meals throughout the day Patient reports planning to try Glucerna supplement as reports often has difficulty eating breakfast Reports working on drinking more water, rather than soda and tea Current exercise: Reports currently limited by right side deficit due to stroke and previous injury to right ankle from a fall Encourage patient to follow up with ankle surgeon to request further physical therapy for ankle Encourage patient to follow up with [place] [facility] to schedule visit with nutritionist as previously recommended by Endocrinologist Reports has  reschedule previously missed appointment with [facility] in 10/2020 Will collaborate with office to send patient a blood sugar log as requested  Medication Adherence: Reports medication adherence improved since started using weekly pillbox   Hyperlipidemia: Current treatment: atorvastatin 40 mg daily  Chronic Obstructive Pulmonary Disease: Current treatment: Trelegy - 1 puff daily Albuterol as needed Confirms using Trelegy daily consistently and rinsing out mouth after each use   Patient Goals/Self-Care Activities Over the next 90 days, patient will:  - focus on medication adherence by using weekly pillbox - check glucose, document, and provide at future appointments - attend medical appointment as scheduled  Next appointment with PCP on 9/6  Follow Up Plan: Telephone follow up appointment with care management team member scheduled for: 11/07/2020 at 1:15 PM     Patient's preferred pharmacy is:  [facility] [address]  [place] [address] Phone: [phone] Fax: [phone]   Follow Up:  Patient agrees to Care Plan and Follow-up.  Wallace Cullens, PharmD, [place], CPP Clinical Pharmacist [facility] [phone]

## 2020-10-09 ENCOUNTER — Other Ambulatory Visit: Payer: Self-pay | Admitting: Family Medicine

## 2020-10-09 DIAGNOSIS — J432 Centrilobular emphysema: Secondary | ICD-10-CM

## 2020-10-10 ENCOUNTER — Ambulatory Visit: Payer: Medicare Other | Admitting: Family Medicine

## 2020-10-12 DIAGNOSIS — J432 Centrilobular emphysema: Secondary | ICD-10-CM | POA: Diagnosis not present

## 2020-10-12 DIAGNOSIS — Z794 Long term (current) use of insulin: Secondary | ICD-10-CM

## 2020-10-12 DIAGNOSIS — E1169 Type 2 diabetes mellitus with other specified complication: Secondary | ICD-10-CM

## 2020-10-15 DIAGNOSIS — M545 Low back pain, unspecified: Secondary | ICD-10-CM | POA: Diagnosis not present

## 2020-10-15 DIAGNOSIS — I69351 Hemiplegia and hemiparesis following cerebral infarction affecting right dominant side: Secondary | ICD-10-CM | POA: Diagnosis not present

## 2020-10-15 DIAGNOSIS — M961 Postlaminectomy syndrome, not elsewhere classified: Secondary | ICD-10-CM | POA: Diagnosis not present

## 2020-10-15 DIAGNOSIS — M25561 Pain in right knee: Secondary | ICD-10-CM | POA: Diagnosis not present

## 2020-10-15 DIAGNOSIS — Z87891 Personal history of nicotine dependence: Secondary | ICD-10-CM | POA: Diagnosis not present

## 2020-10-15 DIAGNOSIS — Z720 Tobacco use: Secondary | ICD-10-CM | POA: Diagnosis not present

## 2020-10-15 DIAGNOSIS — Z79891 Long term (current) use of opiate analgesic: Secondary | ICD-10-CM | POA: Diagnosis not present

## 2020-10-19 ENCOUNTER — Ambulatory Visit: Payer: Medicare Other | Admitting: Family Medicine

## 2020-10-22 ENCOUNTER — Other Ambulatory Visit: Payer: Self-pay

## 2020-10-22 ENCOUNTER — Ambulatory Visit (INDEPENDENT_AMBULATORY_CARE_PROVIDER_SITE_OTHER): Payer: Medicare Other | Admitting: Family Medicine

## 2020-10-22 ENCOUNTER — Encounter: Payer: Self-pay | Admitting: Family Medicine

## 2020-10-22 ENCOUNTER — Ambulatory Visit
Admission: RE | Admit: 2020-10-22 | Discharge: 2020-10-22 | Disposition: A | Discharge status: Home / Self Care | Payer: Medicare Other | Source: Ambulatory Visit | Attending: Family Medicine | Admitting: Family Medicine

## 2020-10-22 ENCOUNTER — Ambulatory Visit
Admission: RE | Admit: 2020-10-22 | Discharge: 2020-10-22 | Disposition: A | Discharge status: Home / Self Care | Payer: Medicare Other | Attending: Family Medicine | Admitting: Family Medicine

## 2020-10-22 VITALS — BP 120/97 | HR 94 | Ht 66.0 in | Wt 255.0 lb

## 2020-10-22 DIAGNOSIS — J040 Acute laryngitis: Secondary | ICD-10-CM

## 2020-10-22 DIAGNOSIS — M79671 Pain in right foot: Secondary | ICD-10-CM | POA: Diagnosis not present

## 2020-10-22 DIAGNOSIS — K121 Other forms of stomatitis: Secondary | ICD-10-CM | POA: Diagnosis not present

## 2020-10-22 DIAGNOSIS — E11621 Type 2 diabetes mellitus with foot ulcer: Secondary | ICD-10-CM

## 2020-10-22 DIAGNOSIS — B370 Candidal stomatitis: Secondary | ICD-10-CM | POA: Diagnosis not present

## 2020-10-22 DIAGNOSIS — L97511 Non-pressure chronic ulcer of other part of right foot limited to breakdown of skin: Secondary | ICD-10-CM | POA: Diagnosis not present

## 2020-10-22 DIAGNOSIS — R490 Dysphonia: Secondary | ICD-10-CM

## 2020-10-22 MED ORDER — NYSTATIN 100000 UNIT/ML MT SUSP: 5.0000 mL | Freq: Four times a day (QID) | OROMUCOSAL | 0 refills | Status: DC

## 2020-10-22 MED ORDER — MAGIC MOUTHWASH W/LIDOCAINE: ORAL | 0 refills | Status: DC

## 2020-10-22 NOTE — Progress Notes (Signed)
Subjective:    Patient ID: [name], female    DOB: [birth year], 55 y.o.   MRN: [id number]  [name] is a 55 y.o. female presenting on 10/22/2020 for Toe Pain and Hoarse   HPI  Right Great Toe, infection Diabetic toe ulcer Since mid 08/2020, has had persistent issue, non healing ulceration scab. Now some improved. Using topical antibiotic neosporin, warm water soaks. Not on antibiotic. No fever chills drainage of pus no spreading redness.  09/21/20 - given oral antibiotic course Clindamycin and Levaquin and topical Mupirocin, has helped but not resolved. Still has superficial ulceration on R great toe, loss of nail. Some drainage and bleeding oozing.  R Foot Pain / Bruising 4 days ago fall injury accidental, injured R forefoot some mild bruising around middle toes  Laryngitis, recurrent Hoarse voice Tobacco Abuse history prior ENT eval with direct laryngoscopy She is active smoker over 1ppd She has episodic hoarse voice 2-3 times per year, not sick currently home covid test negative. No other symptoms.  Admits oral ulcers multiple locations, previous thrush treatment has worked  Depression screen [facility] 2/9 08/06/2020 07/10/2020  Decreased Interest 1 2  Down, Depressed, Hopeless 1 1  PHQ - 2 Score 2 3  Altered sleeping 1 3  Tired, decreased energy 1 3  Change in appetite 1 0  Feeling bad or failure about yourself  1 0  Trouble concentrating 1 0  Moving slowly or fidgety/restless 0 0  Suicidal thoughts 0 0  PHQ-9 Score 7 9  Difficult doing work/chores Not difficult at all Not difficult at all    Social History   Tobacco Use   Smoking status: Every Day    Packs/day: 1.50    Years: 20.00    Pack years: 30.00    Types: Cigarettes   Smokeless tobacco: Never  Vaping Use   Vaping Use: Former  Substance Use Topics   Alcohol use: No   Drug use: Yes    Types: Marijuana    Comment: last smoked 2 days ago  8/4    Review of Systems Per HPI unless specifically  indicated above     Objective:    BP (!) 120/97   Pulse 94   Ht 5\' 6"  (1.676 m)   Wt 255 lb (115.7 kg)   SpO2 97%   BMI 41.16 kg/m   Wt Readings from Last 3 Encounters:  10/22/20 255 lb (115.7 kg)  09/21/20 255 lb (115.7 kg)  08/06/20 250 lb (113.4 kg)    Physical Exam Vitals and nursing note reviewed.  Constitutional:      General: She is not in acute distress.    Appearance: Normal appearance. She is well-developed. She is obese. She is not diaphoretic.     Comments: Well-appearing, comfortable, cooperative  HENT:     Head: Normocephalic and atraumatic.  Eyes:     General:        Right eye: No discharge.        Left eye: No discharge.     Conjunctiva/sclera: Conjunctivae normal.  Cardiovascular:     Rate and Rhythm: Normal rate.  Pulmonary:     Effort: Pulmonary effort is normal.  Musculoskeletal:     Comments: Wheelchair bound unable to weight bear on R foot  No ecchymosis, some mild edema forefoot  R Great toe with superficial skin loss ulceration, missing nail. Some erythema, no extension, no drainage of pus or abscess  Skin:    General: Skin is warm and  dry.     Findings: No erythema or rash.  Neurological:     Mental Status: She is alert and oriented to person, place, and time.  Psychiatric:        Mood and Affect: Mood normal.        Behavior: Behavior normal.        Thought Content: Thought content normal.     Comments: Well groomed, good eye contact, normal speech and thoughts     Results for orders placed or performed during the hospital encounter of 06/09/20  Resp Panel by RT-PCR (Flu A&B, Covid) Nasopharyngeal Swab   Specimen: Nasopharyngeal Swab; Nasopharyngeal(NP) swabs in vial transport medium  Result Value Ref Range   SARS Coronavirus 2 by RT PCR NEGATIVE NEGATIVE   Influenza A by PCR NEGATIVE NEGATIVE   Influenza B by PCR NEGATIVE NEGATIVE      Assessment & Plan:   Problem List Items Addressed This Visit   None Visit Diagnoses      Diabetic ulcer of toe of right foot associated with type 2 diabetes mellitus, limited to breakdown of skin ([place])    -  Primary   Relevant Orders   Ambulatory referral to Wound Clinic   Laryngitis       Relevant Orders   Ambulatory referral to ENT   Right foot pain       Relevant Orders   DG Foot Complete Right (Completed)   Oral ulcer       Relevant Medications   magic mouthwash w/lidocaine SOLN   nystatin (MYCOSTATIN) 100000 UNIT/ML suspension   Other Relevant Orders   Ambulatory referral to ENT   Oral thrush       Relevant Medications   magic mouthwash w/lidocaine SOLN   nystatin (MYCOSTATIN) 100000 UNIT/ML suspension   Hoarseness of voice       Relevant Orders   Ambulatory referral to ENT       R Foot Pain, acute injury Fall Onset 4 day ago, per HPI Concern for possible fracture vs strain She is unable to bear weight ambulate on R Foot, using wheelchair Will check X-ray today STAT follow up results.  Hoarseness Voice / Laryngitis recurrent Chronic recurrent problem, 2-3 times per year, no known trigger ,no assoc symptoms Tobacco use and heavy smoker at risk of multiple complications Also with recurrent oral ulcers - will order printed magic mouthwash vs nystatin wash can pick preference Referral to [facility] for direct laryngoscopy and management of chronic recurrent hoarse voice in smoker  Diabetic Toe Ulceration non healing R great toe Previous treatment with oral and topical antibiotics temporary relief but still remains poor healing. Will refer to [facility] next - may benefit from home health wound care in future if indicated.   Orders Placed This Encounter  Procedures   DG Foot Complete Right    Standing Status:   Future    Number of Occurrences:   1    Standing Expiration Date:   10/22/2021    Order Specific Question:   Reason for Exam (SYMPTOM  OR DIAGNOSIS REQUIRED)    Answer:   fall injury R foot pain and bruising at distal forefoot dorsal,  rule out fracture    Order Specific Question:   Is patient pregnant?    Answer:   No    Order Specific Question:   Preferred imaging location?    Answer:   [facility]   Ambulatory referral to Wound Clinic    Referral Priority:  Routine    Referral Type:   Consultation    Referral Reason:   Specialty Services Required    Requested Specialty:   Wound Care    Number of Visits Requested:   1   Ambulatory referral to ENT    Referral Priority:   Routine    Referral Type:   Consultation    Referral Reason:   Specialty Services Required    Requested Specialty:   Otolaryngology    Number of Visits Requested:   1     Meds ordered this encounter  Medications   magic mouthwash w/lidocaine SOLN    Sig: Swish, gargle, and spit one to two teaspoonfuls every six hours as needed. Shake well before using.    Dispense:  120 mL    Refill:  0    1 Part viscous lidocaine 2%  1 Part Maalox  1 Part diphenhydramine 12.5 mg per 5 ml elixir 1 Part Nystatin   nystatin (MYCOSTATIN) 100000 UNIT/ML suspension    Sig: Take 5 mLs (500,000 Units total) by mouth 4 (four) times daily. For 7-10 days    Dispense:  60 mL    Refill:  0       Follow up plan: Return if symptoms worsen or fail to improve.    Nobie Putnam, DO [facility] 10/22/2020, 4:09 PM

## 2020-10-22 NOTE — Patient Instructions (Addendum)
Thank you for coming to the office today.  Stay tuned referrals will call you with apts  [facility] [address], [place], [place], [place] [address] [phone]  Hopefully wound care can even eventually come out to the house to help.   [facility] [address]  [place], [place] [address] Ph: [phone]  Pick which mouthwash you want to use.  Please schedule a Follow-up Appointment to: Return if symptoms worsen or fail to improve.  If you have any other questions or concerns, please feel free to call the office or send a message through [facility]. You may also schedule an earlier appointment if necessary.  Additionally, you may be receiving a survey about your experience at our office within a few days to 1 week by e-mail or mail. We value your feedback.  Nobie Putnam, DO [facility]

## 2020-10-26 DIAGNOSIS — I69951 Hemiplegia and hemiparesis following unspecified cerebrovascular disease affecting right dominant side: Secondary | ICD-10-CM | POA: Diagnosis not present

## 2020-10-29 DIAGNOSIS — G894 Chronic pain syndrome: Secondary | ICD-10-CM | POA: Diagnosis not present

## 2020-10-29 DIAGNOSIS — M961 Postlaminectomy syndrome, not elsewhere classified: Secondary | ICD-10-CM | POA: Diagnosis not present

## 2020-10-29 DIAGNOSIS — M25561 Pain in right knee: Secondary | ICD-10-CM | POA: Diagnosis not present

## 2020-11-06 ENCOUNTER — Telehealth: Payer: Self-pay | Admitting: Family Medicine

## 2020-11-06 DIAGNOSIS — T3695XA Adverse effect of unspecified systemic antibiotic, initial encounter: Secondary | ICD-10-CM

## 2020-11-06 DIAGNOSIS — B3731 Acute candidiasis of vulva and vagina: Secondary | ICD-10-CM

## 2020-11-06 DIAGNOSIS — B370 Candidal stomatitis: Secondary | ICD-10-CM

## 2020-11-06 DIAGNOSIS — B379 Candidiasis, unspecified: Secondary | ICD-10-CM

## 2020-11-06 MED ORDER — FLUCONAZOLE 150 MG PO TABS: 150.0000 mg | ORAL_TABLET | Freq: Every day | ORAL | 1 refills | Status: DC

## 2020-11-06 NOTE — Telephone Encounter (Signed)
Medication Refill - Medication: Diflucan  Has the patient contacted their pharmacy? No. Pt states no refills for this medication. She states that she has a yeast infection in mouth and in vaginal area. She states that the magic mouth wash is not helping her. Please advise.  (Agent: If no, request that the patient contact the pharmacy for the refill. If patient does not wish to contact the pharmacy document the reason why and proceed with request.) (Agent: If yes, when and what did the pharmacy advise?)  Preferred Pharmacy (with phone number or street name):  Has the patient been seen for an appointment in the last year [facility]  [place] [address]  Phone: [phone] Fax: [phone]  Hours: Not open 24 hours  R does the patient have an upcoming appointment? Yes.    Agent: Please be advised that RX refills may take up to 3 business days. We ask that you follow-up with your pharmacy.

## 2020-11-06 NOTE — Telephone Encounter (Signed)
Please notify patient new rx Diflucan sent to pharmacy. I changed dose now to 150mg  one pill daily for 7 days. Added 1 refill if need in future. This is a stronger dose, hopefully will resolve the issue.  Nobie Putnam, [facility] 11/06/2020, 2:11 PM

## 2020-11-07 ENCOUNTER — Ambulatory Visit: Payer: Medicare Other | Admitting: Internal Medicine

## 2020-11-07 ENCOUNTER — Telehealth: Payer: Medicare Other

## 2020-11-10 ENCOUNTER — Other Ambulatory Visit: Payer: Self-pay | Admitting: Family Medicine

## 2020-11-10 DIAGNOSIS — F331 Major depressive disorder, recurrent, moderate: Secondary | ICD-10-CM

## 2020-11-13 DIAGNOSIS — Z79891 Long term (current) use of opiate analgesic: Secondary | ICD-10-CM | POA: Diagnosis not present

## 2020-11-13 DIAGNOSIS — Z87891 Personal history of nicotine dependence: Secondary | ICD-10-CM | POA: Diagnosis not present

## 2020-11-13 DIAGNOSIS — Z720 Tobacco use: Secondary | ICD-10-CM | POA: Diagnosis not present

## 2020-11-13 DIAGNOSIS — I69351 Hemiplegia and hemiparesis following cerebral infarction affecting right dominant side: Secondary | ICD-10-CM | POA: Diagnosis not present

## 2020-11-13 DIAGNOSIS — M25561 Pain in right knee: Secondary | ICD-10-CM | POA: Diagnosis not present

## 2020-11-13 DIAGNOSIS — M961 Postlaminectomy syndrome, not elsewhere classified: Secondary | ICD-10-CM | POA: Diagnosis not present

## 2020-11-14 ENCOUNTER — Telehealth: Payer: Medicare Other

## 2020-11-14 ENCOUNTER — Telehealth: Payer: Self-pay | Admitting: Pharmacist

## 2020-11-14 NOTE — Telephone Encounter (Signed)
  Chronic Care Management   Outreach Note  11/14/2020 Name: [name] MRN: [id number] DOB: [birth year]  Referred by: Olin Hauser, DO Reason for referral : No chief complaint on file.   Was unable to reach patient via telephone today and have left HIPAA compliant voicemail asking patient to return my call.    Follow Up Plan: Will collaborate with Care Guide to outreach to schedule follow up with me  Wallace Cullens, PharmD, [place] [facility] [phone]

## 2020-11-20 ENCOUNTER — Ambulatory Visit: Payer: Medicare Other | Admitting: Physician Assistant

## 2020-11-21 ENCOUNTER — Other Ambulatory Visit: Payer: Self-pay | Admitting: Family Medicine

## 2020-11-21 DIAGNOSIS — L304 Erythema intertrigo: Secondary | ICD-10-CM

## 2020-11-21 NOTE — Telephone Encounter (Signed)
Pt has been rescheduled. 

## 2020-11-21 NOTE — Telephone Encounter (Signed)
Requested medications are due for refill today.  yes  Requested medications are on the active medications list.  yes  Last refill. 06/16/2017  Future visit scheduled.   yes  Notes to clinic.  Historical medication/ provider. Medication not assigned to a protocol.

## 2020-11-23 DIAGNOSIS — Z5321 Procedure and treatment not carried out due to patient leaving prior to being seen by health care provider: Secondary | ICD-10-CM | POA: Diagnosis not present

## 2020-11-23 DIAGNOSIS — R109 Unspecified abdominal pain: Secondary | ICD-10-CM | POA: Diagnosis not present

## 2020-11-23 DIAGNOSIS — R1110 Vomiting, unspecified: Secondary | ICD-10-CM | POA: Diagnosis not present

## 2020-11-26 DIAGNOSIS — I69951 Hemiplegia and hemiparesis following unspecified cerebrovascular disease affecting right dominant side: Secondary | ICD-10-CM | POA: Diagnosis not present

## 2020-11-28 ENCOUNTER — Ambulatory Visit (INDEPENDENT_AMBULATORY_CARE_PROVIDER_SITE_OTHER): Payer: Medicare Other | Admitting: Family Medicine

## 2020-11-28 ENCOUNTER — Encounter: Payer: Self-pay | Admitting: Family Medicine

## 2020-11-28 ENCOUNTER — Other Ambulatory Visit: Payer: Self-pay

## 2020-11-28 VITALS — BP 122/71 | HR 98 | Ht 66.0 in | Wt 255.0 lb

## 2020-11-28 DIAGNOSIS — Z23 Encounter for immunization: Secondary | ICD-10-CM | POA: Diagnosis not present

## 2020-11-28 DIAGNOSIS — F5104 Psychophysiologic insomnia: Secondary | ICD-10-CM

## 2020-11-28 DIAGNOSIS — E782 Mixed hyperlipidemia: Secondary | ICD-10-CM

## 2020-11-28 MED ORDER — ATORVASTATIN CALCIUM 40 MG PO TABS: 40.0000 mg | ORAL_TABLET | Freq: Every day | ORAL | 3 refills | Status: DC

## 2020-11-28 MED ORDER — TRAZODONE HCL 100 MG PO TABS: ORAL_TABLET | ORAL | 2 refills | Status: DC

## 2020-11-28 MED ORDER — ZOLPIDEM TARTRATE 5 MG PO TABS: 5.0000 mg | ORAL_TABLET | Freq: Every evening | ORAL | 1 refills | Status: DC | PRN

## 2020-11-28 NOTE — Progress Notes (Signed)
Subjective:    Patient ID: [name], female    DOB: February 14, 1965, 55 y.o.   MRN: [id number]  [name] is a 55 y.o. female presenting on 11/28/2020 for Hyperlipidemia and Insomnia   HPI  Chronic Insomnia Difficulty with sleep onset. On Wellbutrin Interested in new sleep rx  Hyperlipidemia On Atorvastatin. Due for refill. Needs new order.  Recent history of GI Stomach Bug - now resolved.  Oral Thrush Recently treated 1 month ago, cleared w/ Diflucan. Has 1 more refill not picked up yet. Mouth breaking out again, thrush similar to before.  Health Maintenance:  Due for Flu Shot, will receive today   Shingrix shingles vaccine - 2 doses, 2-6 months apart, done through pharmacy - after Jan 2023 will be covered by Medicare.  COVID19 updated omicron bivalent booster - free of charge any pharmacy, 2 months after last booster.   Depression screen [facility] 2/9 08/06/2020 07/10/2020  Decreased Interest 1 2  Down, Depressed, Hopeless 1 1  PHQ - 2 Score 2 3  Altered sleeping 1 3  Tired, decreased energy 1 3  Change in appetite 1 0  Feeling bad or failure about yourself  1 0  Trouble concentrating 1 0  Moving slowly or fidgety/restless 0 0  Suicidal thoughts 0 0  PHQ-9 Score 7 9  Difficult doing work/chores Not difficult at all Not difficult at all    Social History   Tobacco Use   Smoking status: Every Day    Packs/day: 1.50    Years: 20.00    Pack years: 30.00    Types: Cigarettes   Smokeless tobacco: Never  Vaping Use   Vaping Use: Former  Substance Use Topics   Alcohol use: No   Drug use: Yes    Types: Marijuana    Comment: last smoked 2 days ago  8/4    Review of Systems Per HPI unless specifically indicated above     Objective:    BP 122/71   Pulse 98   Ht 5\' 6"  (1.676 m)   Wt 255 lb (115.7 kg)   SpO2 97%   BMI 41.16 kg/m   Wt Readings from Last 3 Encounters:  11/28/20 255 lb (115.7 kg)  10/22/20 255 lb (115.7 kg)  09/21/20 255 lb (115.7 kg)     Physical Exam Vitals and nursing note reviewed.  Constitutional:      General: She is not in acute distress.    Appearance: Normal appearance. She is well-developed. She is obese. She is not diaphoretic.     Comments: Well-appearing, comfortable, cooperative  HENT:     Head: Normocephalic and atraumatic.  Eyes:     General:        Right eye: No discharge.        Left eye: No discharge.     Conjunctiva/sclera: Conjunctivae normal.  Cardiovascular:     Rate and Rhythm: Normal rate.  Pulmonary:     Effort: Pulmonary effort is normal.  Skin:    General: Skin is warm and dry.     Findings: No erythema or rash.  Neurological:     Mental Status: She is alert and oriented to person, place, and time.  Psychiatric:        Mood and Affect: Mood normal.        Behavior: Behavior normal.        Thought Content: Thought content normal.     Comments: Well groomed, good eye contact, normal speech and thoughts  Results for orders placed or performed during the hospital encounter of 06/09/20  Resp Panel by RT-PCR (Flu A&B, Covid) Nasopharyngeal Swab   Specimen: Nasopharyngeal Swab; Nasopharyngeal(NP) swabs in vial transport medium  Result Value Ref Range   SARS Coronavirus 2 by RT PCR NEGATIVE NEGATIVE   Influenza A by PCR NEGATIVE NEGATIVE   Influenza B by PCR NEGATIVE NEGATIVE      Assessment & Plan:   Problem List Items Addressed This Visit     Hyperlipidemia   Relevant Medications   atorvastatin (LIPITOR) 40 MG tablet   Other Visit Diagnoses     Psychophysiological insomnia    -  Primary   Relevant Medications   traZODone (DESYREL) 100 MG tablet   zolpidem (AMBIEN) 5 MG tablet   Needs flu shot       Relevant Orders   Flu Vaccine QUAD 47mo+IM (Fluarix, Fluzone & Alfiuria Quad PF)       HLD Refill Atorvastatin 40mg  daily  Flu Shot today  Insomnia Chronic problem Secondary to mental health problem Start Trazodone 100mg  nightly (HALF tab for 50mg  nightly at first  2-4 weeks) titrate dose up to 100mg  Start Zolpidem 5mg  nightly PRN ONLY  Meds ordered this encounter  Medications   atorvastatin (LIPITOR) 40 MG tablet    Sig: Take 1 tablet (40 mg total) by mouth at bedtime.    Dispense:  90 tablet    Refill:  3   traZODone (DESYREL) 100 MG tablet    Sig: Start with half tablet for dose 50mg  nightly at bedtime for 2-4 weeks, then increase to 1 whole tab (100mg ) dose nightly    Dispense:  30 tablet    Refill:  2   zolpidem (AMBIEN) 5 MG tablet    Sig: Take 1 tablet (5 mg total) by mouth at bedtime as needed for sleep.    Dispense:  15 tablet    Refill:  1      Follow up plan: Return in about 6 weeks (around 01/09/2021) for 6 week follow-up Virtual Telephone for Insomnia sleep medicine.  Nobie Putnam, [facility] 11/28/2020, 2:36 PM

## 2020-11-28 NOTE — Patient Instructions (Addendum)
Thank you for coming to the office today.  Flu Shot today  Shingrix shingles vaccine - 2 doses, 2-6 months apart, done through pharmacy - after Jan 2023 will be covered by Medicare.  COVID19 updated omicron bivalent booster - free of charge any pharmacy, 2 months after last booster.  Start with Trazodone HALF pill = 50mg  nightly for 2-4 weeks, after week 3-4 you can increase to 1 whole pill 100mg  if you decide  Also take the Ambien 5mg  nightly AS NEEDED ONLY. Prefer not every night. But can take a few nights in a row to help improve the slep cycle. Do skip some nights in between. We can adjust this in the future.  Please schedule a Follow-up Appointment to: Return in about 6 weeks (around 01/09/2021) for 6 week follow-up Virtual Telephone for Insomnia sleep medicine.  If you have any other questions or concerns, please feel free to call the office or send a message through [facility]. You may also schedule an earlier appointment if necessary.  Additionally, you may be receiving a survey about your experience at our office within a few days to 1 week by e-mail or mail. We value your feedback.  Nobie Putnam, DO [place]

## 2020-11-29 DIAGNOSIS — M961 Postlaminectomy syndrome, not elsewhere classified: Secondary | ICD-10-CM | POA: Diagnosis not present

## 2020-11-29 DIAGNOSIS — M25561 Pain in right knee: Secondary | ICD-10-CM | POA: Diagnosis not present

## 2020-11-29 DIAGNOSIS — G894 Chronic pain syndrome: Secondary | ICD-10-CM | POA: Diagnosis not present

## 2020-12-11 DIAGNOSIS — I69351 Hemiplegia and hemiparesis following cerebral infarction affecting right dominant side: Secondary | ICD-10-CM | POA: Diagnosis not present

## 2020-12-11 DIAGNOSIS — M25561 Pain in right knee: Secondary | ICD-10-CM | POA: Diagnosis not present

## 2020-12-11 DIAGNOSIS — M961 Postlaminectomy syndrome, not elsewhere classified: Secondary | ICD-10-CM | POA: Diagnosis not present

## 2020-12-11 DIAGNOSIS — Z79891 Long term (current) use of opiate analgesic: Secondary | ICD-10-CM | POA: Diagnosis not present

## 2020-12-11 DIAGNOSIS — Z87891 Personal history of nicotine dependence: Secondary | ICD-10-CM | POA: Diagnosis not present

## 2020-12-11 DIAGNOSIS — Z720 Tobacco use: Secondary | ICD-10-CM | POA: Diagnosis not present

## 2020-12-12 ENCOUNTER — Other Ambulatory Visit: Payer: Self-pay

## 2020-12-12 ENCOUNTER — Encounter: Payer: Medicare Other | Attending: Internal Medicine | Admitting: Internal Medicine

## 2020-12-12 DIAGNOSIS — E11621 Type 2 diabetes mellitus with foot ulcer: Secondary | ICD-10-CM | POA: Insufficient documentation

## 2020-12-12 DIAGNOSIS — J449 Chronic obstructive pulmonary disease, unspecified: Secondary | ICD-10-CM | POA: Diagnosis not present

## 2020-12-12 DIAGNOSIS — L03115 Cellulitis of right lower limb: Secondary | ICD-10-CM | POA: Insufficient documentation

## 2020-12-12 DIAGNOSIS — E1140 Type 2 diabetes mellitus with diabetic neuropathy, unspecified: Secondary | ICD-10-CM | POA: Insufficient documentation

## 2020-12-12 DIAGNOSIS — L97511 Non-pressure chronic ulcer of other part of right foot limited to breakdown of skin: Secondary | ICD-10-CM | POA: Diagnosis not present

## 2020-12-13 NOTE — Progress Notes (Signed)
Deanna, [name] ([id number]) Visit Report for 12/12/2020 Chief Complaint Document Details Patient Name: [name], [name] Date of Service: 12/12/2020 12:45 PM Medical Record Number: [id number] Patient Account Number: [id number] Date of Birth/Sex: 07-12-1965 (55 y.o. F) Treating RN: Cornell Barman Primary Care Provider: Nobie Putnam Other Clinician: Referring Provider: Nobie Putnam Treating Provider/Extender: Yaakov Guthrie in Treatment: 0 Information Obtained from: Patient Chief Complaint Right foot wound - 1st toe Electronic Signature(s) Signed: 12/12/2020 2:23:55 PM By: Kalman Shan DO Entered By: Kalman Shan on 12/12/2020 14:12:08 [name], Deanna. ([id number]) -------------------------------------------------------------------------------- HPI Details Patient Name: [name], [name] Date of Service: 12/12/2020 12:45 PM Medical Record Number: [id number] Patient Account Number: [id number] Date of Birth/Sex: 07/13/1965 (55 y.o. F) Treating RN: Cornell Barman Primary Care Provider: Nobie Putnam Other Clinician: Referring Provider: Nobie Putnam Treating Provider/Extender: Yaakov Guthrie in Treatment: 0 History of Present Illness HPI Description: Admission 12/12/2020 [name] is a 55 year old female with a past medical history of uncontrolled insulin-dependent type 2 diabetes with most recent hemoglobin A1c of 12 and COPD that presents to the clinic for a right great toe wound. She has had issues with her right great toe since August 2022. She has been evaluated by her primary care physician for this issue and been treated for wound infection with clindamycin and Levaquin and topical mupirocin on 09/21/2020. She had a right foot x-ray due to a sprained ankle injury on 10/10 that showed no bony abnormality. She presents today because she states that her right great toe Has become red and swollen over the past week. She has had  drainage from the toe but no open wound. Electronic Signature(s) Signed: 12/12/2020 2:23:55 PM By: Kalman Shan DO Entered By: Kalman Shan on 12/12/2020 14:14:15 [name], [name] ([id number]) -------------------------------------------------------------------------------- Physical Exam Details Patient Name: [name], [name] Date of Service: 12/12/2020 12:45 PM Medical Record Number: [id number] Patient Account Number: [id number] Date of Birth/Sex: 1965-08-03 (55 y.o. F) Treating RN: Cornell Barman Primary Care Provider: Nobie Putnam Other Clinician: Referring Provider: Nobie Putnam Treating Provider/Extender: Yaakov Guthrie in Treatment: 0 Constitutional . Cardiovascular . Psychiatric . Notes Right foot: To the right great toe there are areas of skin breakdown. The toe is warm and erythematous. No purulent drainage. Mild pain on exam. Electronic Signature(s) Signed: 12/12/2020 2:23:55 PM By: Kalman Shan DO Entered By: Kalman Shan on 12/12/2020 14:14:49 [name], Deanna AMarland Kitchen ([id number]) -------------------------------------------------------------------------------- Physician Orders Details Patient Name: [name], [name] Date of Service: 12/12/2020 12:45 PM Medical Record Number: [id number] Patient Account Number: [id number] Date of Birth/Sex: September 27, 1965 (55 y.o. F) Treating RN: Cornell Barman Primary Care Provider: Nobie Putnam Other Clinician: Referring Provider: Nobie Putnam Treating Provider/Extender: Yaakov Guthrie in Treatment: 0 Verbal / Phone Orders: No Diagnosis Coding ICD-10 Coding Code Description E11.621 Type 2 diabetes mellitus with foot ulcer L97.511 Non-pressure chronic ulcer of other part of right foot limited to breakdown of skin J44.9 Chronic obstructive pulmonary disease, unspecified L03.115 Cellulitis of right lower limb Follow-up Appointments Wound #1 Right,Circumferential Toe Great o Return  Appointment in 1 week. Bathing/ Shower/ Hygiene o May shower; gently cleanse wound with antibacterial soap, rinse and pat dry prior to dressing wounds Medications-Please add to medication list. o P.O. Antibiotics Wound Treatment Wound #1 - Toe Great Wound Laterality: Right, Circumferential Primary Dressing: Silvercel Small 2x2 (in/in) Discharge Instructions: Apply Silvercel Small 2x2 (in/in) as instructed Secondary Dressing: Gauze Discharge Instructions: As directed: dry, moistened with saline or moistened with Dakins Solution Secured With: Conforming Stretch Gauze Bandage 2x75 (  in/in) Discharge Instructions: Apply as directed Patient Medications Allergies: Augmentin, Toradol Notifications Medication Indication Start End clindamycin HCl 12/12/2020 DOSE 1 - oral 300 mg capsule - 1 capsule oral Q6H x 7 days Electronic Signature(s) Signed: 12/12/2020 2:27:47 PM By: Kalman Shan DO Signed: 12/13/2020 11:53:14 AM By: Gretta Cool, BSN, RN, CWS, Kim RN, BSN Previous Signature: 12/12/2020 2:23:55 PM Version By: Kalman Shan DO Previous Signature: 12/12/2020 2:20:43 PM Version By: Kalman Shan DO Entered By: Gretta Cool BSN, RN, CWS, Kim on 12/12/2020 14:23:54 [name], [name] ([id number]) -------------------------------------------------------------------------------- Problem List Details Patient Name: [name], [name] Date of Service: 12/12/2020 12:45 PM Medical Record Number: [id number] Patient Account Number: [id number] Date of Birth/Sex: 1965-09-09 (55 y.o. F) Treating RN: Cornell Barman Primary Care Provider: Nobie Putnam Other Clinician: Referring Provider: Nobie Putnam Treating Provider/Extender: Yaakov Guthrie in Treatment: 0 Active Problems ICD-10 Encounter Code Description Active Date MDM Diagnosis E11.621 Type 2 diabetes mellitus with foot ulcer 12/12/2020 No Yes L97.511 Non-pressure chronic ulcer of other part of right foot limited to 12/12/2020  No Yes breakdown of skin J44.9 Chronic obstructive pulmonary disease, unspecified 12/12/2020 No Yes L03.115 Cellulitis of right lower limb 12/12/2020 No Yes Inactive Problems Resolved Problems Electronic Signature(s) Signed: 12/12/2020 2:23:55 PM By: Kalman Shan DO Entered By: Kalman Shan on 12/12/2020 14:11:49 [name], [name] ([id number]) -------------------------------------------------------------------------------- Progress Note Details Patient Name: [name], [name] Date of Service: 12/12/2020 12:45 PM Medical Record Number: [id number] Patient Account Number: [id number] Date of Birth/Sex: Feb 24, 1965 (55 y.o. F) Treating RN: Cornell Barman Primary Care Provider: Nobie Putnam Other Clinician: Referring Provider: Nobie Putnam Treating Provider/Extender: Yaakov Guthrie in Treatment: 0 Subjective Chief Complaint Information obtained from Patient Right foot wound - 1st toe History of Present Illness (HPI) Admission 12/12/2020 Ms. Tsering Leaman is a 55 year old female with a past medical history of uncontrolled insulin-dependent type 2 diabetes with most recent hemoglobin A1c of 12 and COPD that presents to the clinic for a right great toe wound. She has had issues with her right great toe since August 2022. She has been evaluated by her primary care physician for this issue and been treated for wound infection with clindamycin and Levaquin and topical mupirocin on 09/21/2020. She had a right foot x-ray due to a sprained ankle injury on 10/10 that showed no bony abnormality. She presents today because she states that her right great toe Has become red and swollen over the past week. She has had drainage from the toe but no open wound. Patient History Information obtained from Patient, Chart. Allergies Augmentin (Reaction: blisters in mouth), Toradol (Reaction: elevated BP) Social History Current every day smoker, Marital Status - Married, Alcohol Use -  Never, Drug Use - Prior History, Caffeine Use - Never. Medical History Respiratory Patient has history of Chronic Obstructive Pulmonary Disease (COPD) Endocrine Patient has history of Type II Diabetes Musculoskeletal Patient has history of Osteoarthritis Neurologic Patient has history of Neuropathy - feet, right arm Patient is treated with Oral Agents. Blood sugar is tested. Blood sugar results noted at the following times: Breakfast - 314. Medical And Surgical History Notes Neurologic Stroke 2013 Review of Systems (ROS) Constitutional Symptoms (General Health) Denies complaints or symptoms of Fatigue, Fever, Chills, Marked Weight Change. Eyes Complains or has symptoms of Glasses / Contacts - vision. Ear/Nose/Mouth/Throat Denies complaints or symptoms of Difficult clearing ears, Sinusitis. Hematologic/Lymphatic Denies complaints or symptoms of Bleeding / Clotting Disorders, Human Immunodeficiency Virus. Respiratory Complains or has symptoms of Shortness of Breath. Genitourinary Complains or has symptoms of  Incontinence/dribbling. Immunological Denies complaints or symptoms of Hives, Itching. Integumentary (Skin) Complains or has symptoms of Wounds, Bleeding or bruising tendency. Musculoskeletal Complains or has symptoms of Muscle Pain, Muscle Weakness. Psychiatric Complains or has symptoms of Anxiety. [name], [name] ([id number]) Objective Constitutional Vitals Time Taken: 12:55 PM, Height: 66 in, Weight: 255 lbs, BMI: 41.2, Temperature: 98.3 F, Pulse: 94 bpm, Respiratory Rate: 18 breaths/min, Blood Pressure: 127/81 mmHg. General Notes: Right foot: To the right great toe there are areas of skin breakdown. The toe is warm and erythematous. No purulent drainage. Mild pain on exam. Integumentary (Hair, Skin) Wound #1 status is Open. Original cause of wound was Gradually Appeared. The date acquired was: 10/22/2020. The wound is located on the Right,Circumferential Toe Great.  The wound measures 0.1cm length x 0.1cm width x 0.1cm depth; 0.008cm^2 area and 0.001cm^3 volume. There is a medium amount of serous drainage noted. Assessment Active Problems ICD-10 Type 2 diabetes mellitus with foot ulcer Non-pressure chronic ulcer of other part of right foot limited to breakdown of skin Chronic obstructive pulmonary disease, unspecified Cellulitis of right lower limb Patient presents with a 41-month history of ongoing issues with her right great toe. She has an area of skin breakdown and I recommended using silver alginate to this. Its red and swollen and I will treat her with clindamycin for potential cellulitis. She denies ever having an open wound to the area. She has a history of uncontrolled diabetes and is a current smoker with diagnosis of COPD. We discussed the importance of glycemic control and smoking cessation in wound healing. She expressed understanding. She knows she is at a high risk of future amputations due to these factors. If there is no improvement with clindamycin I may obtain an x-ray. If that is normal she would likely need to be worked up for gout by her pcp. 47 minutes was spent on the encounter including face-to-face, EMR review and coordination of care Plan The following medication(s) was prescribed: clindamycin HCl oral 300 mg capsule 1 1 capsule oral Q6H x 7 days starting 12/12/2020 1. Clindamycin 2. Silver alginate Daily 3. Follow-up in 1 week Electronic Signature(s) Signed: 12/12/2020 2:23:55 PM By: Kalman Shan DO Entered By: Kalman Shan on 12/12/2020 14:22:30 [name], [name] ([id number]) -------------------------------------------------------------------------------- ROS/PFSH Details Patient Name: [name], [name] Date of Service: 12/12/2020 12:45 PM Medical Record Number: [id number] Patient Account Number: [id number] Date of Birth/Sex: [birth year] (55 y.o. F) Treating RN: Cornell Barman Primary Care Provider: Nobie Putnam  Other Clinician: Referring Provider: Nobie Putnam Treating Provider/Extender: Yaakov Guthrie in Treatment: 0 Information Obtained From Patient Chart Constitutional Symptoms (General Health) Complaints and Symptoms: Negative for: Fatigue; Fever; Chills; Marked Weight Change Eyes Complaints and Symptoms: Positive for: Glasses / Contacts - vision Ear/Nose/Mouth/Throat Complaints and Symptoms: Negative for: Difficult clearing ears; Sinusitis Hematologic/Lymphatic Complaints and Symptoms: Negative for: Bleeding / Clotting Disorders; Human Immunodeficiency Virus Respiratory Complaints and Symptoms: Positive for: Shortness of Breath Medical History: Positive for: Chronic Obstructive Pulmonary Disease (COPD) Genitourinary Complaints and Symptoms: Positive for: Incontinence/dribbling Immunological Complaints and Symptoms: Negative for: Hives; Itching Integumentary (Skin) Complaints and Symptoms: Positive for: Wounds; Bleeding or bruising tendency Musculoskeletal Complaints and Symptoms: Positive for: Muscle Pain; Muscle Weakness Medical History: Positive for: Osteoarthritis Psychiatric Complaints and Symptoms: Positive for: Anxiety [name], [name] ([id number]) Endocrine Medical History: Positive for: Type II Diabetes Time with diabetes: 10 years Treated with: Oral agents Blood sugar tested every day: Yes Tested : 2 Blood sugar testing results: Breakfast: 314 Neurologic Medical History:  Positive for: Neuropathy - feet, right arm Past Medical History Notes: Stroke 2013 Oncologic Immunizations Pneumococcal Vaccine: Received Pneumococcal Vaccination: Yes Received Pneumococcal Vaccination On or After 60th Birthday: No Tetanus Vaccine: Last tetanus shot: 01/14/2011 Implantable Devices None Family and Social History Current every day smoker; Marital Status - Married; Alcohol Use: Never; Drug Use: Prior History; Caffeine Use: Never Electronic  Signature(s) Signed: 12/12/2020 2:23:55 PM By: Kalman Shan DO Signed: 12/13/2020 11:53:14 AM By: Gretta Cool, BSN, RN, CWS, Kim RN, BSN Entered By: Gretta Cool, BSN, RN, CWS, Kim on 12/12/2020 13:05:33 Pingley, [name] Care ([id number]) -------------------------------------------------------------------------------- SuperBill Details Patient Name: [name], [name] Date of Service: 12/12/2020 Medical Record Number: [id number] Patient Account Number: [id number] Date of Birth/Sex: Aug 21, 1965 (55 y.o. F) Treating RN: Cornell Barman Primary Care Provider: Nobie Putnam Other Clinician: Referring Provider: Nobie Putnam Treating Provider/Extender: Yaakov Guthrie in Treatment: 0 Diagnosis Coding ICD-10 Codes Code Description [phone] Type 2 diabetes mellitus with foot ulcer L97.511 Non-pressure chronic ulcer of other part of right foot limited to breakdown of skin J44.9 Chronic obstructive pulmonary disease, unspecified L03.115 Cellulitis of right lower limb Facility Procedures CPT4 Code: [id number] Description: 99214 - WOUND CARE VISIT-LEV 4 EST PT Modifier: Quantity: 1 Physician Procedures CPT4 Code: [id number] Description: 86381 - WC PHYS LEVEL 4 - NEW PT Modifier: Quantity: 1 CPT4 Code: Description: ICD-10 Diagnosis Description E11.621 Type 2 diabetes mellitus with foot ulcer L97.511 Non-pressure chronic ulcer of other part of right foot limited to breakd L03.115 Cellulitis of right lower limb J44.9 Chronic obstructive pulmonary  disease, unspecified Modifier: own of skin Quantity: Electronic Signature(s) Signed: 12/12/2020 2:33:32 PM By: Kalman Shan DO Signed: 12/13/2020 11:53:14 AM By: Gretta Cool, BSN, RN, CWS, Kim RN, BSN Previous Signature: 12/12/2020 2:23:55 PM Version By: Kalman Shan DO Entered By: Gretta Cool, BSN, RN, CWS, Kim on 12/12/2020 14:29:00

## 2020-12-13 NOTE — Progress Notes (Signed)
Reposa, Issa A. ([id number]) Visit Report for 12/12/2020 Abuse/Suicide Risk Screen Details Patient Name: [name], [name] Date of Service: 12/12/2020 12:45 PM Medical Record Number: [id number] Patient Account Number: [id number] Date of Birth/Sex: 1965-08-07 (55 y.o. F) Treating RN: Cornell Barman Primary Care Rejina Odle: Nobie Putnam Other Clinician: Referring Shaneen Reeser: Nobie Putnam Treating Neoma Uhrich/Extender: Yaakov Guthrie in Treatment: 0 Abuse/Suicide Risk Screen Items Answer ABUSE RISK SCREEN: Has anyone close to you tried to hurt or harm you recentlyo No Do you feel uncomfortable with anyone in your familyo No Has anyone forced you do things that you didnot want to doo No Electronic Signature(s) Signed: 12/13/2020 11:53:14 AM By: Gretta Cool, BSN, RN, CWS, Kim RN, BSN Entered By: Gretta Cool, BSN, RN, CWS, Kim on 12/12/2020 13:05:39 Giarratano, Davisha A. ([id number]) -------------------------------------------------------------------------------- Activities of Daily Living Details Patient Name: [name], [name] Date of Service: 12/12/2020 12:45 PM Medical Record Number: [id number] Patient Account Number: [id number] Date of Birth/Sex: [birth year] (55 y.o. F) Treating RN: Cornell Barman Primary Care Berdene Askari: Nobie Putnam Other Clinician: Referring Meili Kleckley: Nobie Putnam Treating Roxas Clymer/Extender: Yaakov Guthrie in Treatment: 0 Activities of Daily Living Items Answer Activities of Daily Living (Please select one for each item) Drive Automobile Not Able Take Medications Completely Able Use Telephone Completely Able Care for Appearance Completely Able Use Toilet Completely Able Bath / Shower Completely Able Dress Self Completely Able Feed Self Completely Able Walk Completely Able Get In / Out Bed Completely Able Housework Need Assistance Prepare Meals Need Assistance Handle Money Completely Able Shop for Self Need Assistance Electronic  Signature(s) Signed: 12/13/2020 11:53:14 AM By: Gretta Cool, BSN, RN, CWS, Kim RN, BSN Entered By: Gretta Cool, BSN, RN, CWS, Kim on 12/12/2020 13:06:18 Tennison, Toy Care ([id number]) -------------------------------------------------------------------------------- Education Screening Details Patient Name: [name], [name] Date of Service: 12/12/2020 12:45 PM Medical Record Number: [id number] Patient Account Number: [id number] Date of Birth/Sex: 10/22/1965 (55 y.o. F) Treating RN: Cornell Barman Primary Care Janara Klett: Nobie Putnam Other Clinician: Referring Tyhesha Dutson: Nobie Putnam Treating Saraann Enneking/Extender: Yaakov Guthrie in Treatment: 0 Primary Learner Assessed: Patient Learning Preferences/Education Level/Primary Language Learning Preference: Explanation, Demonstration Highest Education Level: High School Preferred Language: English Cognitive Barrier Language Barrier: No Translator Needed: No Memory Deficit: No Emotional Barrier: No Cultural/Religious Beliefs Affecting Medical Care: No Physical Barrier Impaired Vision: No Impaired Hearing: No Decreased Hand dexterity: No Knowledge/Comprehension Knowledge Level: High Comprehension Level: High Ability to understand written instructions: High Ability to understand verbal instructions: High Motivation Anxiety Level: Calm Cooperation: Cooperative Education Importance: Acknowledges Need Interest in Health Problems: Asks Questions Perception: Coherent Willingness to Engage in Self-Management High Activities: Readiness to Engage in Self-Management High Activities: Electronic Signature(s) Signed: 12/13/2020 11:53:14 AM By: Gretta Cool, BSN, RN, CWS, Kim RN, BSN Entered By: Gretta Cool, BSN, RN, CWS, Kim on 12/12/2020 13:06:47 Quade, Toy Care ([id number]) -------------------------------------------------------------------------------- Fall Risk Assessment Details Patient Name: [name], [name] Date of Service: 12/12/2020 12:45  PM Medical Record Number: [id number] Patient Account Number: [id number] Date of Birth/Sex: [birth year] (55 y.o. F) Treating RN: Cornell Barman Primary Care Erman Thum: Nobie Putnam Other Clinician: Referring Chrisy Hillebrand: Nobie Putnam Treating Melrose Kearse/Extender: Yaakov Guthrie in Treatment: 0 Fall Risk Assessment Items Have you had 2 or more falls in the last 12 monthso 0 No Have you had any fall that resulted in injury in the last 12 monthso 0 No FALLS RISK SCREEN History of falling - immediate or within 3 months 0 No Secondary diagnosis (Do you have 2 or more medical diagnoseso) 0 No Ambulatory aid None/bed rest/wheelchair/nurse 0 No Crutches/cane/walker 15  Yes Furniture 0 No Intravenous therapy Access/Saline/Heparin Lock 0 No Gait/Transferring Normal/ bed rest/ wheelchair 0 No Weak (short steps with or without shuffle, stooped but able to lift head while walking, may 10 Yes seek support from furniture) Impaired (short steps with shuffle, may have difficulty arising from chair, head down, impaired 0 No balance) Mental Status Oriented to own ability 0 Yes Electronic Signature(s) Signed: 12/13/2020 11:53:14 AM By: Gretta Cool, BSN, RN, CWS, Kim RN, BSN Entered By: Gretta Cool, BSN, RN, CWS, Kim on 12/12/2020 13:07:39 Stgermaine, Larinda A. ([id number]) -------------------------------------------------------------------------------- Foot Assessment Details Patient Name: [name], [name] Date of Service: 12/12/2020 12:45 PM Medical Record Number: [id number] Patient Account Number: [id number] Date of Birth/Sex: 1965/07/23 (55 y.o. F) Treating RN: Cornell Barman Primary Care Jeliyah Middlebrooks: Nobie Putnam Other Clinician: Referring Raeden Belzer: Nobie Putnam Treating Gissel Keilman/Extender: Yaakov Guthrie in Treatment: 0 Foot Assessment Items Site Locations + = Sensation present, - = Sensation absent, C = Callus, U = Ulcer R = Redness, W = Warmth, M = Maceration, PU =  Pre-ulcerative lesion F = Fissure, S = Swelling, D = Dryness Assessment Right: Left: Other Deformity: No No Prior Foot Ulcer: No No Prior Amputation: No No Charcot Joint: No No Ambulatory Status: Ambulatory With Help Assistance Device: Walker GaitEnergy manager) Signed: 12/13/2020 11:53:14 AM By: Gretta Cool, BSN, RN, CWS, Kim RN, BSN Entered By: Gretta Cool, BSN, RN, CWS, Kim on 12/12/2020 13:20:11 [name], [name] ([id number]) -------------------------------------------------------------------------------- Nutrition Risk Screening Details Patient Name: [name], [name] Date of Service: 12/12/2020 12:45 PM Medical Record Number: [id number] Patient Account Number: [id number] Date of Birth/Sex: May 01, 1965 (55 y.o. F) Treating RN: Cornell Barman Primary Care Jahzaria Vary: Nobie Putnam Other Clinician: Referring Sydelle Sherfield: Nobie Putnam Treating Shalona Harbour/Extender: Yaakov Guthrie in Treatment: 0 Height (in): 66 Weight (lbs): 255 Body Mass Index (BMI): 41.2 Nutrition Risk Screening Items Score Screening NUTRITION RISK SCREEN: I have an illness or condition that made me change the kind and/or amount of food I eat 0 No I eat fewer than two meals per day 0 No I eat few fruits and vegetables, or milk products 0 No I have three or more drinks of beer, liquor or wine almost every day 0 No I have tooth or mouth problems that make it hard for me to eat 0 No I don't always have enough money to buy the food I need 0 No I eat alone most of the time 0 No I take three or more different prescribed or over-the-counter drugs a day 1 Yes Without wanting to, I have lost or gained 10 pounds in the last six months 0 No I am not always physically able to shop, cook and/or feed myself 0 No Nutrition Protocols Good Risk Protocol Moderate Risk Protocol 0 Provide education on nutrition High Risk Proctocol Risk Level: Good Risk Score: 1 Electronic Signature(s) Signed: 12/13/2020  11:53:14 AM By: Gretta Cool, BSN, RN, CWS, Kim RN, BSN Entered By: Gretta Cool, BSN, RN, CWS, Kim on 12/12/2020 13:08:25

## 2020-12-13 NOTE — Progress Notes (Signed)
AHMAYA, OSTERMILLER ([id number]) Visit Report for 12/12/2020 Allergy List Details Patient Name: [name], [name] Date of Service: 12/12/2020 12:45 PM Medical Record Number: [id number] Patient Account Number: [id number] Date of Birth/Sex: 30-Jun-1965 (55 y.o. F) Treating RN: Cornell Barman Primary [name]: Nobie Putnam Other Clinician: Referring [name]: Nobie Putnam Treating Burnette Sautter/Extender: Yaakov Guthrie in Treatment: 0 Allergies Active Allergies Augmentin Reaction: blisters in mouth Toradol Reaction: elevated BP Allergy Notes Electronic Signature(s) Signed: 12/13/2020 11:53:14 AM By: Gretta Cool, BSN, RN, CWS, Kim RN, BSN Entered By: Gretta Cool, BSN, RN, CWS, Kim on 12/12/2020 12:59:59 [name], [name] ([id number]) -------------------------------------------------------------------------------- Arrival Information Details Patient Name: [name], [name] Date of Service: 12/12/2020 12:45 PM Medical Record Number: [id number] Patient Account Number: [id number] Date of Birth/Sex: [birth year] (55 y.o. F) Treating RN: Cornell Barman Primary [name] [name]: Nobie Putnam Other Clinician: Referring [name]: Nobie Putnam Treating Fines Kimberlin/Extender: Yaakov Guthrie in Treatment: 0 Visit Information Patient Arrived: Wheel Chair Arrival Time: 12:53 Accompanied By: self Transfer Assistance: Manual Patient Identification Verified: Yes Secondary Verification Process Completed: Yes Patient Requires Transmission-Based No Precautions: Patient Has Alerts: Yes Patient Alerts: Patient on Blood Thinner Plavix Type II Diabetic 12/12/20 Patient [place] >220 Right Leg Electronic Signature(s) Signed: 12/13/2020 11:53:14 AM By: Gretta Cool, BSN, RN, CWS, Kim RN, BSN Entered By: Gretta Cool, BSN, RN, CWS, Kim on 12/12/2020 13:27:18 [name], [name] ([id number]) -------------------------------------------------------------------------------- Clinic Level of [name] Assessment  Details Patient Name: [name], [name] Date of Service: 12/12/2020 12:45 PM Medical Record Number: [id number] Patient Account Number: [id number] Date of Birth/Sex: 08/18/1965 (55 y.o. F) Treating RN: Cornell Barman Primary [name]: Nobie Putnam Other Clinician: Referring [name]: Nobie Putnam Treating [name]/Extender: Yaakov Guthrie in Treatment: 0 Clinic Level of [name] Assessment Items TOOL 2 Quantity Score []  - Use when only an EandM is performed on the INITIAL visit 0 ASSESSMENTS - Nursing Assessment / Reassessment X - General Physical Exam (combine w/ comprehensive assessment (listed just below) when performed on new 1 20 pt. evals) X- 1 25 Comprehensive Assessment (HX, ROS, Risk Assessments, Wounds Hx, etc.) ASSESSMENTS - Wound and Skin Assessment / Reassessment X - Simple Wound Assessment / Reassessment - one wound 1 5 []  - 0 Complex Wound Assessment / Reassessment - multiple wounds []  - 0 Dermatologic / Skin Assessment (not related to wound area) ASSESSMENTS - Ostomy and/or Continence Assessment and [name] []  - Incontinence Assessment and Management 0 []  - 0 Ostomy [name] Assessment and Management (repouching, etc.) PROCESS - Coordination of [name] X - Simple Patient / Family Education for ongoing [name] 1 15 []  - 0 Complex (extensive) Patient / Family Education for ongoing [name] X- 1 10 Staff obtains Programmer, systems, Records, Test Results / Process Orders []  - 0 Staff telephones HHA, Nursing Homes / Clarify orders / etc []  - 0 Routine Transfer to another Facility (non-emergent condition) []  - 0 Routine Hospital Admission (non-emergent condition) X- 1 15 New Admissions / Biomedical engineer / Ordering NPWT, Apligraf, etc. []  - 0 Emergency Hospital Admission (emergent condition) X- 1 10 Simple Discharge Coordination []  - 0 Complex (extensive) Discharge Coordination PROCESS - Special Needs []  - Pediatric / Minor Patient Management 0 []  -  0 Isolation Patient Management []  - 0 Hearing / Language / Visual special needs []  - 0 Assessment of Community assistance (transportation, D/C planning, etc.) []  - 0 Additional assistance / Altered mentation []  - 0 Support Surface(s) Assessment (bed, cushion, seat, etc.) INTERVENTIONS - Wound Cleansing / Measurement X - Wound Imaging (photographs - any number of wounds) 1  5 []  - 0 Wound Tracing (instead of photographs) X- 1 5 Simple Wound Measurement - one wound []  - 0 Complex Wound Measurement - multiple wounds [name], [name] ([id number]) X- 1 5 Simple Wound Cleansing - one wound []  - 0 Complex Wound Cleansing - multiple wounds INTERVENTIONS - Wound Dressings X - Small Wound Dressing one or multiple wounds 1 10 []  - 0 Medium Wound Dressing one or multiple wounds []  - 0 Large Wound Dressing one or multiple wounds []  - 0 Application of Medications - injection INTERVENTIONS - Miscellaneous []  - External ear exam 0 []  - 0 Specimen Collection (cultures, biopsies, blood, body fluids, etc.) []  - 0 Specimen(s) / Culture(s) sent or taken to Lab for analysis []  - 0 Patient Transfer (multiple staff / Civil Service fast streamer / Similar devices) []  - 0 Simple Staple / Suture removal (25 or less) []  - 0 Complex Staple / Suture removal (26 or more) []  - 0 Hypo / Hyperglycemic Management (close monitor of Blood Glucose) X- 1 15 Ankle / Brachial Index (ABI) - do not check if billed separately Has the patient been seen at the hospital within the last three years: Yes Total Score: 140 Level Of [name]: New/Established - Level 4 Electronic Signature(s) Signed: 12/13/2020 11:53:14 AM By: Gretta Cool, BSN, RN, CWS, Kim RN, BSN Entered By: Gretta Cool, BSN, RN, CWS, Kim on 12/12/2020 14:27:34 [name], [name] ([id number]) -------------------------------------------------------------------------------- Encounter Discharge Information Details Patient Name: [name], [name] Date of Service: 12/12/2020 12:45  PM Medical Record Number: [id number] Patient Account Number: [id number] Date of Birth/Sex: 10-09-1965 (55 y.o. F) Treating RN: Cornell Barman Primary [name] Brittian Renaldo: Nobie Putnam Other Clinician: Referring Kayia Billinger: Nobie Putnam Treating Beyza Bellino/Extender: Yaakov Guthrie in Treatment: 0 Encounter Discharge Information Items Discharge Condition: Stable Ambulatory Status: Wheelchair Discharge Destination: Home Transportation: Private Auto Accompanied By: self Schedule Follow-up Appointment: Yes Clinical Summary of [name]: Electronic Signature(s) Signed: 12/13/2020 11:53:14 AM By: Gretta Cool, BSN, RN, CWS, Kim RN, BSN Entered By: Gretta Cool, BSN, RN, CWS, Kim on 12/12/2020 14:33:36 Grealish, [name] ([id number]) -------------------------------------------------------------------------------- Lower Extremity Assessment Details Patient Name: [name], [name] Date of Service: 12/12/2020 12:45 PM Medical Record Number: [id number] Patient Account Number: [id number] Date of Birth/Sex: 11/03/1965 (55 y.o. F) Treating RN: Cornell Barman Primary [name] Kaileena Obi: Nobie Putnam Other Clinician: Referring Trichelle Lehan: Nobie Putnam Treating Avary Eichenberger/Extender: Yaakov Guthrie in Treatment: 0 Edema Assessment Assessed: Shirlyn Goltz: No] Patrice Paradise: No] [Left: Edema] [Right: :] Calf Left: Right: Point of Measurement: 29 cm From Medial Instep 36 cm Ankle Left: Right: Point of Measurement: 11 cm From Medial Instep 24 cm Vascular Assessment Pulses: Dorsalis Pedis Palpable: [Right:Yes] Doppler Audible: [Right:Yes] Posterior Tibial Palpable: [Right:Yes] Notes Patient [place] >220 Electronic Signature(s) Signed: 12/13/2020 11:53:14 AM By: Gretta Cool, BSN, RN, CWS, Kim RN, BSN Entered By: Gretta Cool, BSN, RN, CWS, Kim on 12/12/2020 13:26:49 Rettig, Shirell A. ([id number]) -------------------------------------------------------------------------------- Multi Wound Chart Details Patient Name: [name],  [name] Date of Service: 12/12/2020 12:45 PM Medical Record Number: [id number] Patient Account Number: [id number] Date of Birth/Sex: 09/22/1965 (55 y.o. 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 Comorbid History: Chronic Obstructive Pulmonary N/A N/A Disease (COPD), Type II Diabetes, Osteoarthritis,  Neuropathy Date Acquired: 10/22/2020 N/A N/A Weeks of Treatment: 0 N/A N/A Wound Status: Open N/A N/A Measurements L x W x D (cm) 0.1x0.1x0.1 N/A N/A Area (cm) : 0.008 N/A N/A Volume (cm) : 0.001 N/A N/A % Reduction in Area: 0.00% N/A N/A % Reduction in Volume: 0.00% N/A N/A Classification: Unable to visualize wound bed N/A N/A Exudate Amount: Medium N/A N/A Exudate Type: Serous N/A N/A Exudate Color: amber N/A N/A Jesson, Kelsha AMarland Kitchen ([id number]) Treatment Notes Electronic Signature(s) Signed: 12/13/2020 11:53:14 AM By: Gretta Cool, BSN, RN, CWS, Kim RN, BSN Entered By: Gretta Cool, BSN, RN, CWS, Kim on 12/12/2020 14:20:35 Chow, [name] ([id number]) -------------------------------------------------------------------------------- Multi-Disciplinary [name] Plan Details Patient Name: [name], [name] Date of Service: 12/12/2020 12:45 PM Medical Record Number: [id number] Patient Account Number: [id number] Date of Birth/Sex: 01/24/1965 (55 y.o. F) Treating RN: Cornell Barman Primary [name] Cianni Manny: Nobie Putnam Other Clinician: Referring Ellizabeth Dacruz: Nobie Putnam Treating  Drayton Tieu/Extender: Yaakov Guthrie in Treatment: 0 Active Inactive Orientation to the Wound [name] Program Nursing Diagnoses: Knowledge deficit related to the wound healing center program Goals: Patient/caregiver will verbalize understanding of the [place] Program Date Initiated: 12/12/2020 Target Resolution Date: 12/12/2020 Goal Status: Active Interventions: Provide education on orientation to the wound center Notes: Wound/Skin Impairment Nursing Diagnoses: Impaired tissue integrity Goals: Patient/caregiver will verbalize understanding of skin [name] regimen Date Initiated: 12/12/2020 Target Resolution Date: 12/12/2020 Goal Status: Active Interventions: Assess ulceration(s) every visit Provide education on smoking Treatment Activities: Topical wound management initiated : 12/12/2020 Notes: Electronic Signature(s) Signed: 12/13/2020 11:53:14 AM By: Gretta Cool, BSN, RN, CWS, Kim RN, BSN Entered By: Gretta Cool, BSN, RN, CWS, Kim on 12/12/2020 14:18:20 Urbanek, Brittan A. ([id number]) -------------------------------------------------------------------------------- Pain Assessment Details Patient Name: [name], [name] Date of Service: 12/12/2020 12:45 PM Medical Record Number: [id number] Patient Account Number: [id number] Date of Birth/Sex: 1965-11-29 (55 y.o. F) Treating RN: Cornell Barman Primary [name] Stepfanie Yott: Nobie Putnam Other Clinician: Referring Excell Neyland: Nobie Putnam Treating Margaux Engen/Extender: Yaakov Guthrie in Treatment: 0 Active Problems Location of Pain Severity and Description of Pain Patient Has Paino Yes Site Locations Pain Location: Generalized Pain Pain Management and Medication Current Pain Management: Notes Patient states she hurts all over. Occasional pain in wound. Electronic Signature(s) Signed: 12/13/2020 11:53:14 AM By: Gretta Cool, BSN, RN, CWS, Kim RN, BSN Entered By: Gretta Cool, BSN, RN, CWS, Kim on 12/12/2020 12:58:35 Skellenger,  [name] ([id number]) -------------------------------------------------------------------------------- Patient/Caregiver Education Details Patient Name: [name], [name] Date of Service: 12/12/2020 12:45 PM Medical Record Number: [id number] Patient Account Number: [id number] Date of Birth/Gender: July 12, 1965 (55 y.o. F) Treating RN: Cornell Barman Primary [name] Physician: Nobie Putnam Other Clinician: Referring Physician: Nobie Putnam Treating Physician/Extender: Yaakov Guthrie in Treatment: 0 Education Assessment Education Provided To: Patient Education Topics Provided Smoking and Wound Healing: Handouts: Smoking and Wound Healing Methods: Explain/Verbal Responses: State content correctly Welcome To [facility]: Handouts: Welcome To [facility] Methods: Explain/Verbal Responses: State content correctly Electronic Signature(s) Signed: 12/13/2020 11:53:14 AM By: Gretta Cool, BSN, RN, CWS, Kim RN, BSN Entered By: Gretta Cool, BSN, RN, CWS, Kim on 12/12/2020 14:32:36 [name], [name] ([id number]) -------------------------------------------------------------------------------- Wound Assessment Details Patient Name: [name], [name] Date of Service: 12/12/2020 12:45 PM Medical Record Number: [id number] Patient Account Number: [id number] Date of Birth/Sex: April 16, 1965 (55 y.o. F) Treating RN: Cornell Barman Primary [name] Elvi Leventhal: Nobie Putnam Other Clinician: Referring Ramata Strothman: Nobie Putnam Treating Zayah Keilman/Extender: Yaakov Guthrie in Treatment: 0 Wound Status Wound Number: 1 Primary Diabetic Wound/Ulcer of the Lower Extremity Etiology: Wound Location: Right,  Circumferential Toe Great Wound Open Wounding Event: Gradually Appeared Status: Date Acquired: 10/22/2020 Comorbid Chronic Obstructive Pulmonary Disease (COPD), Type II Weeks Of Treatment: 0 History: Diabetes, Osteoarthritis, Neuropathy Clustered Wound: No Photos Wound  Measurements Length: (cm) 0.1 Width: (cm) 0.1 Depth: (cm) 0.1 Area: (cm) 0.008 Volume: (cm) 0.001 % Reduction in Area: 0% % Reduction in Volume: 0% Wound Description Classification: Unable to visualize wound bed Exudate Amount: Medium Exudate Type: Serous Exudate Color: amber Treatment Notes Wound #1 (Toe Great) Wound Laterality: Right, Circumferential Cleanser Peri-Wound [name] Topical Primary Dressing Silvercel Small 2x2 (in/in) Discharge Instruction: Apply Silvercel Small 2x2 (in/in) as instructed Secondary Dressing Gauze Discharge Instruction: As directed: dry, moistened with saline or moistened with Dakins Solution Secured With Conforming Stretch Gauze Bandage 2x75 (in/in) Sylvestre, Irmgard A. ([id number]) Discharge Instruction: Apply as directed Compression Wrap Compression Stockings Add-Ons Electronic Signature(s) Signed: 12/13/2020 11:53:14 AM By: Gretta Cool, BSN, RN, CWS, Kim RN, BSN Entered By: Gretta Cool, BSN, RN, CWS, Kim on 12/12/2020 13:23:14 Fadness, Akshita A. ([id number]) -------------------------------------------------------------------------------- Vitals Details Patient Name: [name], [name] Date of Service: 12/12/2020 12:45 PM Medical Record Number: [id number] Patient Account Number: [id number] Date of Birth/Sex: [birth year] (55 y.o. F) Treating RN: Cornell Barman Primary [name] Verbena Boeding: Nobie Putnam Other Clinician: Referring Judah Carchi: Nobie Putnam Treating Eliberto Sole/Extender: Yaakov Guthrie in Treatment: 0 Vital Signs Time Taken: 12:55 Temperature (F): 98.3 Height (in): 66 Pulse (bpm): 94 Weight (lbs): 255 Respiratory Rate (breaths/min): 18 Body Mass Index (BMI): 41.2 Blood Pressure (mmHg): 127/81 Reference Range: 80 - 120 mg / dl Electronic Signature(s) Signed: 12/13/2020 11:53:14 AM By: Gretta Cool, BSN, RN, CWS, Kim RN, BSN Entered By: Gretta Cool, BSN, RN, CWS, Kim on 12/12/2020 12:59:07

## 2020-12-19 ENCOUNTER — Encounter: Payer: Medicare Other | Attending: Internal Medicine | Admitting: Internal Medicine

## 2020-12-19 ENCOUNTER — Other Ambulatory Visit: Payer: Self-pay

## 2020-12-19 DIAGNOSIS — E11621 Type 2 diabetes mellitus with foot ulcer: Secondary | ICD-10-CM | POA: Insufficient documentation

## 2020-12-19 DIAGNOSIS — E1151 Type 2 diabetes mellitus with diabetic peripheral angiopathy without gangrene: Secondary | ICD-10-CM | POA: Diagnosis not present

## 2020-12-19 DIAGNOSIS — L03115 Cellulitis of right lower limb: Secondary | ICD-10-CM | POA: Diagnosis not present

## 2020-12-19 DIAGNOSIS — E1140 Type 2 diabetes mellitus with diabetic neuropathy, unspecified: Secondary | ICD-10-CM | POA: Diagnosis not present

## 2020-12-19 DIAGNOSIS — L97511 Non-pressure chronic ulcer of other part of right foot limited to breakdown of skin: Secondary | ICD-10-CM | POA: Insufficient documentation

## 2020-12-19 NOTE — Progress Notes (Signed)
BELLANY, ELBAUM ([id number]) Visit Report for 12/19/2020 Arrival Information Details Patient Name: Reihl, Franchesca A. Date of Service: 12/19/2020 1:00 PM Medical Record Number: [id number] Patient Account Number: [id number] Date of Birth/Sex: [birth year] (55 y.o. F) Treating RN: Donnamarie Poag Primary Care Ezrael Sam: Nobie Putnam Other Clinician: Referring Kardell Virgil: Nobie Putnam Treating Sarit Sparano/Extender: Yaakov Guthrie in Treatment: 1 Visit Information History Since Last Visit Added or deleted any medications: No Patient Arrived: Wheel Chair Had a fall or experienced change in No Arrival Time: 13:12 activities of daily living that may affect Accompanied By: husband risk of falls: Transfer Assistance: None Hospitalized since last visit: No Patient Identification Verified: Yes Has Dressing in Place as Prescribed: Yes Secondary Verification Process Completed: Yes Pain Present Now: Yes Patient Requires Transmission-Based No Precautions: Patient Has Alerts: Yes Patient Alerts: Patient on Blood Thinner Plavix Type II Diabetic 12/12/20 Patient [place] >220 Right Leg Electronic Signature(s) Signed: 12/19/2020 4:15:55 PM By: Donnamarie Poag Entered By: Donnamarie Poag on 12/19/2020 13:14:44 Mccanless, Seriah A. ([id number]) -------------------------------------------------------------------------------- Clinic Level of Care Assessment Details Patient Name: Stanislaw, Maloree A. Date of Service: 12/19/2020 1:00 PM Medical Record Number: [id number] Patient Account Number: [id number] Date of Birth/Sex: 04/13/1965 (55 y.o. F) Treating RN: Donnamarie Poag Primary Care Rafaela Dinius: Nobie Putnam Other Clinician: Referring Jyron Turman: Nobie Putnam Treating Darshawn Boateng/Extender: Yaakov Guthrie in Treatment: 1 Clinic Level of Care Assessment Items TOOL 4 Quantity Score _0  - Use when only an EandM is performed on FOLLOW-UP visit 0 ASSESSMENTS - Nursing Assessment /  Reassessment _1  - Reassessment of Co-morbidities (includes updates in patient status) 0 _2  - 0 Reassessment of Adherence to Treatment Plan ASSESSMENTS - Wound and Skin Assessment / Reassessment _3  - Simple Wound Assessment / Reassessment - one wound 0 _4  - 0 Complex Wound Assessment / Reassessment - multiple wounds _5  - 0 Dermatologic / Skin Assessment (not related to wound area) ASSESSMENTS - Focused Assessment _6  - Circumferential Edema Measurements - multi extremities 0 _7  - 0 Nutritional Assessment / Counseling / Intervention _8  - 0 Lower Extremity Assessment (monofilament, tuning fork, pulses) _9  - 0 Peripheral Arterial Disease Assessment (using hand held doppler) ASSESSMENTS - Ostomy and/or Continence Assessment and Care _10  - Incontinence Assessment and Management 0 _11  - 0 Ostomy Care Assessment and Management (repouching, etc.) PROCESS - Coordination of Care X - Simple Patient / Family Education for ongoing care 1 15 _12  - 0 Complex (extensive) Patient / Family Education for ongoing care _13  - 0 Staff obtains Programmer, systems, Records, Test Results / Process Orders _14  - 0 Staff telephones HHA, Nursing Homes / Clarify orders / etc _15  - 0 Routine Transfer to another Facility (non-emergent condition) _16  - 0 Routine Hospital Admission (non-emergent condition) _17  - 0 New Admissions / Biomedical engineer / Ordering NPWT, Apligraf, etc. _18  - 0 Emergency Hospital Admission (emergent condition) X- 1 10 Simple Discharge Coordination _19  - 0 Complex (extensive) Discharge Coordination PROCESS - Special Needs _20  - Pediatric / Minor Patient Management 0 _21  - 0 Isolation Patient Management _22  - 0 Hearing / Language / Visual special needs _23  - 0 Assessment of Community assistance (transportation, D/C planning, etc.) _24  - 0 Additional assistance / Altered mentation _25  - 0 Support Surface(s) Assessment (bed, cushion, seat, etc.) INTERVENTIONS - Wound Cleansing /  Measurement Luscher, Jamiee A. ([id number]) X- 1 5 Simple Wound Cleansing - one wound _26  - 0 Complex Wound Cleansing - multiple wounds X- 1 5 Wound Imaging (photographs - any number of wounds) _27  - 0 Wound Tracing (instead of photographs)  X- 1 5 Simple Wound Measurement - one wound _0  - 0 Complex Wound Measurement - multiple wounds INTERVENTIONS - Wound Dressings X - Small Wound Dressing one or multiple wounds 1 10 _1  - 0 Medium Wound Dressing one or multiple wounds _2  - 0 Large Wound Dressing one or multiple wounds X- 1 5 Application of Medications - topical <VFIEPPIRJJOACZYS>_0<\/YTKZSWFUXNATFTDD>_2  - 0 Application of Medications - injection INTERVENTIONS - Miscellaneous _4  - External ear exam 0 _5  - 0 Specimen Collection (cultures, biopsies, blood, body fluids, etc.) _6  - 0 Specimen(s) / Culture(s) sent or taken to Lab for analysis _7  - 0 Patient Transfer (multiple staff / Civil Service fast streamer / Similar devices) _8  - 0 Simple Staple / Suture removal (25 or less) _9  - 0 Complex Staple / Suture removal (26 or more) _10  - 0 Hypo / Hyperglycemic Management (close monitor of Blood Glucose) _11  - 0 Ankle / Brachial Index (ABI) - do not check if billed separately X- 1 5 Vital Signs Has the patient been seen at the hospital within the last three years: Yes Total Score: 60 Level Of Care: New/Established - Level 2 Electronic Signature(s) Signed: 12/19/2020 4:15:55 PM By: Donnamarie Poag Entered By: Donnamarie Poag on 12/19/2020 13:54:55 Henry, Trena A. ([id number]) -------------------------------------------------------------------------------- Encounter Discharge Information Details Patient Name: Ege, Loy A. Date of Service: 12/19/2020 1:00 PM Medical Record Number: [id number] Patient Account Number: [id number] Date of Birth/Sex: Oct 03, 1965 (55 y.o. F) Treating RN: Donnamarie Poag Primary Care Hodge Stachnik: Nobie Putnam Other Clinician: Referring Nioka Thorington: Nobie Putnam Treating Little Winton/Extender: Yaakov Guthrie in Treatment: 1 Encounter Discharge Information Items Post Procedure Vitals Discharge Condition: Stable Temperature (F): 98.3 Ambulatory Status: Wheelchair Pulse (bpm): 90 Discharge Destination: Home Respiratory Rate (breaths/min): 16 Transportation: Private Auto Blood Pressure (mmHg): 100/70 Accompanied By: husband Schedule Follow-up Appointment: Yes Clinical Summary of Care: Electronic Signature(s) Signed: 12/19/2020 4:15:55 PM By: Donnamarie Poag Entered By: Donnamarie Poag on 12/19/2020 13:54:28 Yazdani, Wenona A. ([id number]) -------------------------------------------------------------------------------- Lower Extremity Assessment Details Patient Name: Northrop, Meiko A. Date of Service: 12/19/2020 1:00 PM Medical Record Number: [id number] Patient Account Number: [id number] Date of Birth/Sex: 07/02/1965 (55 y.o. F) Treating RN: Donnamarie Poag Primary Care Courtez Twaddle: Nobie Putnam Other Clinician: Referring Paije Goodhart: Nobie Putnam Treating Emmajo Bennette/Extender: Yaakov Guthrie in Treatment: 1 Edema Assessment Assessed: [Left: No] Patrice Paradise: No] [Left: Edema] [Right: :] Calf Left: Right: Point of Measurement: 29 cm From Medial Instep 35 cm Ankle Left: Right: Point of Measurement: 11 cm From Medial Instep 24 cm Vascular Assessment Pulses: Dorsalis Pedis Palpable: [Right:Yes] Electronic Signature(s) Signed: 12/19/2020 4:15:55 PM By: Donnamarie Poag Entered By: Donnamarie Poag on 12/19/2020 13:26:03 Rieser, Sharon A. ([id number]) -------------------------------------------------------------------------------- Multi Wound Chart Details Patient Name: Ogletree, Marlys A. Date of Service: 12/19/2020 1:00 PM Medical Record Number: [id number] Patient Account Number: [id number] Date of Birth/Sex: 1965-07-30 (55 y.o. 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 Comorbid History: Chronic Obstructive Pulmonary N/A N/A Disease (COPD), Type II Diabetes, Osteoarthritis, Neuropathy Date Acquired: 10/22/2020 N/A N/A Weeks of Treatment: 1 N/A N/A Wound Status: Open N/A N/A Measurements L x W x D (cm) 0.1x0.1x0.1 N/A N/A Area (cm) : 0.008 N/A N/A Volume (cm) :  0.001 N/A N/A % Reduction in Area: 0.00% N/A N/A % Reduction in Volume: 0.00% N/A N/A Classification: Unable to visualize wound bed N/A N/A Exudate Amount: Medium N/A N/A Exudate Type: Serous N/A N/A Exudate Color: amber N/A N/A Granulation Amount: Large (67-100%) N/A N/A Granulation Quality: Red, Pink N/A N/A Treatment Notes Electronic Signature(s) Signed: 12/19/2020 4:15:55 PM By: Donnamarie Poag Entered By: Donnamarie Poag on 12/19/2020 13:41:13 Limon, [place] A. ([id number]) Chunn, Cherrise AMarland Kitchen ([id number]) -------------------------------------------------------------------------------- Multi-Disciplinary Care Plan Details Patient Name: Minihan, Ashani A. Date of Service: 12/19/2020 1:00 PM Medical Record Number: [id number] Patient Account Number: [id number] Date of Birth/Sex: [birth year] (55 y.o. F) Treating RN: Donnamarie Poag Primary Care Silvano Garofano: Nobie Putnam Other Clinician: Referring Trenese Haft: Nobie Putnam Treating Susanna Benge/Extender: Yaakov Guthrie in Treatment: 1 Active Inactive Wound/Skin Impairment Nursing Diagnoses: Impaired tissue integrity Goals: Patient/caregiver will verbalize understanding of skin care regimen Date Initiated:  12/12/2020 Target Resolution Date: 12/12/2020 Goal Status: Active Interventions: Assess ulceration(s) every visit Provide education on smoking Treatment Activities: Topical wound management initiated : 12/12/2020 Notes: Electronic Signature(s) Signed: 12/19/2020 4:15:55 PM By: Donnamarie Poag Entered By: Donnamarie Poag on 12/19/2020 13:41:01 Mckamey, Randall A. ([id number]) -------------------------------------------------------------------------------- Pain Assessment Details Patient Name: Fullen, Kierstin A. Date of Service: 12/19/2020 1:00 PM Medical Record Number: [id number] Patient Account Number: [id number] Date of Birth/Sex: 1965-04-24 (55 y.o. F) Treating RN: Donnamarie Poag Primary Care Pecola Haxton: Nobie Putnam Other Clinician: Referring Mong Neal: Nobie Putnam Treating Chamya Hunton/Extender: Yaakov Guthrie in Treatment: 1 Active Problems Location of Pain Severity and Description of Pain Patient Has Paino Yes Site Locations Pain Location: Generalized Pain Rate the pain. Current Pain Level: 8 Pain Management and Medication Current Pain Management: Electronic Signature(s) Signed: 12/19/2020 4:15:55 PM By: Donnamarie Poag Entered By: Donnamarie Poag on 12/19/2020 13:20:34 Hanway, Sama A. ([id number]) -------------------------------------------------------------------------------- Patient/Caregiver Education Details Patient Name: Tanimoto, Jabree A. Date of Service: 12/19/2020 1:00 PM Medical Record Number: [id number] Patient Account Number: [id number] Date of Birth/Gender: March 09, 1965 (55 y.o. F) Treating RN: Donnamarie Poag Primary Care Physician: Nobie Putnam Other Clinician: Referring Physician: Nobie Putnam Treating Physician/Extender: Yaakov Guthrie in Treatment: 1 Education Assessment Education Provided To: Patient Education Topics Provided Basic Hygiene: Infection: Smoking and Wound Healing: Wound/Skin Impairment: Electronic  Signature(s) Signed: 12/19/2020 4:15:55 PM By: Donnamarie Poag Entered By: Donnamarie Poag on 12/19/2020 13:52:26 Freas, Ashia A. ([id number]) -------------------------------------------------------------------------------- Wound Assessment Details Patient Name: Mccalip, Jaynee A. Date of Service: 12/19/2020 1:00 PM Medical Record Number: [id number] Patient Account Number: [id number] Date of Birth/Sex: 09/23/1965 (55 y.o. F) Treating RN: Donnamarie Poag Primary Care Ross Hefferan: Nobie Putnam Other Clinician: Referring Caralina Nop: Nobie Putnam Treating Cheyene Hamric/Extender: Yaakov Guthrie in Treatment: 1 Wound Status Wound Number: 1 Primary Diabetic Wound/Ulcer of the Lower Extremity Etiology: Wound Location: Right, Circumferential Toe Great Wound Open Wounding Event: Gradually Appeared Status: Date Acquired: 10/22/2020 Comorbid Chronic Obstructive Pulmonary Disease (COPD), Type II Weeks Of Treatment: 1 History: Diabetes, Osteoarthritis, Neuropathy Clustered Wound: No Photos Wound Measurements Length: (cm) 0.1 Width: (cm) 0.1 Depth: (cm) 0.1 Area: (cm) 0.008 Volume: (cm) 0.001 % Reduction in Area: 0% % Reduction in Volume: 0% Tunneling: No Undermining: No Wound Description Classification: Unable to visualize wound bed Exudate Amount: Medium Exudate Type: Serous Exudate Color: amber Foul Odor After Cleansing: No Slough/Fibrino No Wound Bed Granulation Amount: Large (67-100%) Granulation Quality: Red, Pink Treatment Notes Wound #1 (Toe Great) Wound Laterality: Right, Circumferential Cleanser [place] Ancillary Kit - 15 Day Supply Discharge Instruction: Use supplies as instructed; Kit contains: (15) Saline Bullets; (15) 3x3 Gauze; 15 pr Gloves  Normal Saline Discharge Instruction: Wash your hands with soap and water. Remove old dressing, discard into plastic bag and place into trash. Cleanse the wound with Normal Saline prior to applying a clean dressing using gauze  sponges, not tissues or cotton balls. Do not scrub or use excessive force. Pat dry using gauze sponges, not tissue or cotton balls. Peri-Wound Care Topical Maring, Isabellamarie A. ([id number]) Gentamicin Discharge Instruction: Apply as directed by Folasade Mooty. Primary Dressing Gauze Secondary Dressing [place] Roll-Medium Discharge Instruction: Apply Conforming Stretch Guaze Bandage as directed Secured With 62M Medipore H Soft Cloth Surgical Tape, 2x2 (in/yd) Compression Wrap Compression Stockings Add-Ons Electronic Signature(s) Signed: 12/19/2020 4:15:55 PM By: Donnamarie Poag Entered By: Donnamarie Poag on 12/19/2020 13:24:02 Ruben, Pima A. ([id number]) -------------------------------------------------------------------------------- Vitals Details Patient Name: Swor, Merleen A. Date of Service: 12/19/2020 1:00 PM Medical Record Number: [id number] Patient Account Number: [id number] Date of Birth/Sex: 02-Apr-1965 (55 y.o. F) Treating RN: Donnamarie Poag Primary Care Jemell [place]: Nobie Putnam Other Clinician: Referring Cash Duce: Nobie Putnam Treating Karmelo Bass/Extender: Yaakov Guthrie in Treatment: 1 Vital Signs Time Taken: 15:15 Temperature (F): 98.3 Height (in): 66 Pulse (bpm): 90 Weight (lbs): 255 Respiratory Rate (breaths/min): 16 Body Mass Index (BMI): 41.2 Blood Pressure (mmHg): 100/70 Reference Range: 80 - 120 mg / dl Electronic Signature(s) Signed: 12/19/2020 4:15:55 PM By: Donnamarie Poag Entered ByDonnamarie Poag on 12/19/2020 13:20:09

## 2020-12-19 NOTE — Progress Notes (Signed)
Deanna, [name] ([id number]) Visit Report for 12/19/2020 Chief Complaint Document Details Patient Name: [name], [name] Date of Service: 12/19/2020 1:00 PM Medical Record Number: [id number] Patient Account Number: [id number] Date of Birth/Sex: 09/11/1965 (55 y.o. F) Treating RN: Donnamarie Poag Primary Care Provider: Nobie Putnam Other Clinician: Referring Provider: Nobie Putnam Treating Provider/Extender: Yaakov Guthrie in Treatment: 1 Information Obtained from: Patient Chief Complaint Right foot wound - 1st toe Electronic Signature(s) Signed: 12/19/2020 1:56:55 PM By: Kalman Shan DO Entered By: Kalman Shan on 12/19/2020 13:38:53 Elliott, Alainah A. ([id number]) -------------------------------------------------------------------------------- Debridement Details Patient Name: [name], [name] Date of Service: 12/19/2020 1:00 PM Medical Record Number: [id number] Patient Account Number: [id number] Date of Birth/Sex: 1965-02-04 (55 y.o. F) Treating RN: Donnamarie Poag Primary Care Provider: Nobie Putnam Other Clinician: Referring Provider: Nobie Putnam Treating Provider/Extender: Yaakov Guthrie in Treatment: 1 Debridement Performed for Wound #1 Right,Circumferential Toe Great Assessment: Performed By: Physician Kalman Shan, MD Debridement Type: Chemical/Enzymatic/Mechanical Agent Used: saline gauze Severity of Tissue Pre Debridement: Limited to breakdown of skin Level of Consciousness (Pre- Awake and Alert procedure): Pre-procedure Verification/Time Out Yes - 13:35 Taken: Start Time: 13:36 Pain Control: Lidocaine Instrument: Other : saline gauze Bleeding: None Response to Treatment: Procedure was tolerated well Level of Consciousness (Post- Awake and Alert procedure): Post Debridement Measurements of Total Wound Length: (cm) 0.1 Width: (cm) 0.1 Depth: (cm) 0.1 Volume: (cm) 0.001 Character of Wound/Ulcer Post  Debridement: Improved Severity of Tissue Post Debridement: Limited to breakdown of skin Post Procedure Diagnosis Same as Pre-procedure Electronic Signature(s) Signed: 12/19/2020 1:56:55 PM By: Kalman Shan DO Signed: 12/19/2020 4:15:55 PM By: Donnamarie Poag Entered By: Donnamarie Poag on 12/19/2020 13:42:05 Calixto, Shaterra A. ([id number]) -------------------------------------------------------------------------------- HPI Details Patient Name: [name], [name] Date of Service: 12/19/2020 1:00 PM Medical Record Number: [id number] Patient Account Number: [id number] Date of Birth/Sex: November 25, 1965 (55 y.o. F) Treating RN: Donnamarie Poag Primary Care Provider: Nobie Putnam Other Clinician: Referring Provider: Nobie Putnam Treating Provider/Extender: Yaakov Guthrie in Treatment: 1 History of Present Illness HPI Description: Admission 12/12/2020 Ms. Varie Machamer is a 55 year old female with a past medical history of uncontrolled insulin-dependent type 2 diabetes with most recent hemoglobin A1c of 12 and COPD that presents to the clinic for a right great toe wound. She has had issues with her right great toe since August 2022. She has been evaluated by her primary care physician for this issue and been treated for wound infection with clindamycin and Levaquin and topical mupirocin on 09/21/2020. She had a right foot x-ray due to a sprained ankle injury on 10/10 that showed no bony abnormality. She presents today because she states that her right great toe Has become red and swollen over the past week. She has had drainage from the toe but no open wound. 12/7; patient presents for 1 week follow-up. She is taken 4 days worth of clindamycin at this point. She reports stability in her symptoms. She has been using Neosporin to the wound bed. She is not sure how the silver alginate works so she has not been putting it on. Electronic Signature(s) Signed: 12/19/2020 1:56:55 PM By: Kalman Shan DO Entered By: Kalman Shan on 12/19/2020 13:39:50 Pozzi, Krystel A. ([id number]) -------------------------------------------------------------------------------- Physical Exam Details Patient Name: [name], [name] Date of Service: 12/19/2020 1:00 PM Medical Record Number: [id number] Patient Account Number: [id number] Date of Birth/Sex: 08-01-1965 (55 y.o. F) Treating RN: Donnamarie Poag Primary Care Provider: Nobie Putnam Other Clinician: Referring Provider: Nobie Putnam Treating Provider/Extender: Yaakov Guthrie in  Treatment: 1 Constitutional . Cardiovascular . Psychiatric . Notes Right foot: To the right great toe there are areas of skin breakdown. The toe is erythematous and swollen. No purulent drainage. No tenderness on exam. Electronic Signature(s) Signed: 12/19/2020 1:56:55 PM By: Kalman Shan DO Entered By: Kalman Shan on 12/19/2020 13:40:39 Boateng, Lahoma A. ([id number]) -------------------------------------------------------------------------------- Physician Orders Details Patient Name: [name], [name] Date of Service: 12/19/2020 1:00 PM Medical Record Number: [id number] Patient Account Number: [id number] Date of Birth/Sex: 08-17-1965 (55 y.o. F) Treating RN: Donnamarie Poag Primary Care Provider: Nobie Putnam Other Clinician: Referring Provider: Nobie Putnam Treating Provider/Extender: Yaakov Guthrie in Treatment: 1 Verbal / Phone Orders: No Diagnosis Coding ICD-10 Coding Code Description E11.621 Type 2 diabetes mellitus with foot ulcer L97.511 Non-pressure chronic ulcer of other part of right foot limited to breakdown of skin J44.9 Chronic obstructive pulmonary disease, unspecified L03.115 Cellulitis of right lower limb Follow-up Appointments Wound #1 Right,Circumferential Toe Great o Return Appointment in 1 week. Bathing/ Shower/ Hygiene o May shower; gently cleanse wound with antibacterial  soap, rinse and pat dry prior to dressing wounds o No tub bath. Anesthetic (Use 'Patient Medications' Section for Anesthetic Order Entry) o Lidocaine applied to wound bed Edema Control - Lymphedema / Segmental Compressive Device / Other o Elevate leg(s) parallel to the floor when sitting. o DO YOUR BEST to sleep in the bed at night. DO NOT sleep in your recliner. Long hours of sitting in a recliner leads to swelling of the legs and/or potential wounds on your backside. Off-Loading o Open toe surgical shoe Additional Orders / Instructions o Follow Nutritious Diet and Increase Protein Intake Medications-Please add to medication list. o P.O. Antibiotics - continue as prescribed until finished Wound Treatment Wound #1 - Toe Great Wound Laterality: Right, Circumferential Cleanser: Byram Ancillary Kit - 15 Day Supply (DME) (Generic) 1 x Per Day/15 Days Discharge Instructions: Use supplies as instructed; Kit contains: (15) Saline Bullets; (15) 3x3 Gauze; 15 pr Gloves Cleanser: Normal Saline 1 x Per Day/15 Days Discharge Instructions: Wash your hands with soap and water. Remove old dressing, discard into plastic bag and place into trash. Cleanse the wound with Normal Saline prior to applying a clean dressing using gauze sponges, not tissues or cotton balls. Do not scrub or use excessive force. Pat dry using gauze sponges, not tissue or cotton balls. Topical: Gentamicin 1 x Per Day/15 Days Discharge Instructions: Apply as directed by provider. Primary Dressing: Gauze (DME) (Generic) 1 x Per Day/15 Days Secondary Dressing: Conforming Guaze Roll-Medium (DME) (Generic) 1 x Per ORV/61 Days Discharge Instructions: Apply Conforming Stretch Guaze Bandage as directed Secured With: 67M Medipore H Soft Cloth Surgical Tape, 2x2 (in/yd) (DME) (Generic) 1 x Per Day/15 Days Lagman, Linsi A. ([id number]) Radiology o X-ray, foot - Take paper to outpatient for xray right foot - (ICD10 L97.511 -  Non-pressure chronic ulcer of other part of right foot limited to breakdown of skin) Notes Pick up the Gentamycin ointment from your pharmacy to apply as directed Electronic Signature(s) Signed: 12/19/2020 1:56:55 PM By: Kalman Shan DO Previous Signature: 12/19/2020 1:44:28 PM Version By: Kalman Shan DO Entered By: Kalman Shan on 12/19/2020 13:55:21 Derhammer, [place]. ([id number]) -------------------------------------------------------------------------------- Problem List Details Patient Name: Frenkel, Brooklin A. Date of Service: 12/19/2020 1:00 PM Medical Record Number: [id number] Patient Account Number: [id number] Date of Birth/Sex: December 31, 1965 (55 y.o. F) Treating RN: Donnamarie Poag Primary Care Provider: Nobie Putnam Other Clinician: Referring Provider: Nobie Putnam Treating Provider/Extender: Yaakov Guthrie in Treatment: 1 Active  Problems ICD-10 Encounter Code Description Active Date MDM Diagnosis E11.621 Type 2 diabetes mellitus with foot ulcer 12/12/2020 No Yes L97.511 Non-pressure chronic ulcer of other part of right foot limited to 12/12/2020 No Yes breakdown of skin J44.9 Chronic obstructive pulmonary disease, unspecified 12/12/2020 No Yes L03.115 Cellulitis of right lower limb 12/12/2020 No Yes Inactive Problems Resolved Problems Electronic Signature(s) Signed: 12/19/2020 1:56:55 PM By: Kalman Shan DO Entered By: Kalman Shan on 12/19/2020 13:38:42 Rosasco, [place]. ([id number]) -------------------------------------------------------------------------------- Progress Note Details Patient Name: Cessna, Jasira A. Date of Service: 12/19/2020 1:00 PM Medical Record Number: [id number] Patient Account Number: [id number] Date of Birth/Sex: [birth year] (55 y.o. F) Treating RN: Donnamarie Poag Primary Care Provider: Nobie Putnam Other Clinician: Referring Provider: Nobie Putnam Treating Provider/Extender: Yaakov Guthrie in Treatment: 1 Subjective Chief Complaint Information obtained from Patient Right foot wound - 1st toe History of Present Illness (HPI) Admission 12/12/2020 Ms. Keilee Denman is a 55 year old female with a past medical history of uncontrolled insulin-dependent type 2 diabetes with most recent hemoglobin A1c of 12 and COPD that presents to the clinic for a right great toe wound. She has had issues with her right great toe since August 2022. She has been evaluated by her primary care physician for this issue and been treated for wound infection with clindamycin and Levaquin and topical mupirocin on 09/21/2020. She had a right foot x-ray due to a sprained ankle injury on 10/10 that showed no bony abnormality. She presents today because she states that her right great toe Has become red and swollen over the past week. She has had drainage from the toe but no open wound. 12/7; patient presents for 1 week follow-up. She is taken 4 days worth of clindamycin at this point. She reports stability in her symptoms. She has been using Neosporin to the wound bed. She is not sure how the silver alginate works so she has not been putting it on. Objective Constitutional Vitals Time Taken: 3:15 PM, Height: 66 in, Weight: 255 lbs, BMI: 41.2, Temperature: 98.3 F, Pulse: 90 bpm, Respiratory Rate: 16 breaths/min, Blood Pressure: 100/70 mmHg. General Notes: Right foot: To the right great toe there are areas of skin breakdown. The toe is erythematous and swollen. No purulent drainage. No tenderness on exam. Integumentary (Hair, Skin) Wound #1 status is Open. Original cause of wound was Gradually Appeared. The date acquired was: 10/22/2020. The wound has been in treatment 1 weeks. The wound is located on the Right,Circumferential Toe Great. The wound measures 0.1cm length x 0.1cm width x 0.1cm depth; 0.008cm^2 area and 0.001cm^3 volume. There is no tunneling or undermining noted. There is a medium  amount of serous drainage noted. There is large (67-100%) red, pink granulation within the wound bed. Assessment Active Problems ICD-10 Type 2 diabetes mellitus with foot ulcer Non-pressure chronic ulcer of other part of right foot limited to breakdown of skin Chronic obstructive pulmonary disease, unspecified Cellulitis of right lower limb Patient's toe is stable. There is mostly epithelialization to the areas of skin breakdown. At this time I recommended using gentamicin cream and obtaining an x-ray since there is still swelling and erythema. Follow-up in 1 week. Khawaja, Fidelia A. ([id number]) Plan Follow-up Appointments: Wound #1 Right,Circumferential Toe Great: Return Appointment in 1 week. Bathing/ Shower/ Hygiene: May shower; gently cleanse wound with antibacterial soap, rinse and pat dry prior to dressing wounds No tub bath. Anesthetic (Use 'Patient Medications' Section for Anesthetic Order Entry): Lidocaine applied to wound bed Edema Control - Lymphedema /  Segmental Compressive Device / Other: Elevate leg(s) parallel to the floor when sitting. DO YOUR BEST to sleep in the bed at night. DO NOT sleep in your recliner. Long hours of sitting in a recliner leads to swelling of the legs and/or potential wounds on your backside. Off-Loading: Open toe surgical shoe Additional Orders / Instructions: Follow Nutritious Diet and Increase Protein Intake Medications-Please add to medication list.: P.O. Antibiotics - continue as prescribed until finished Radiology ordered were: X-ray, foot - Take paper to outpatient for xray right foot General Notes: Pick up the Gentamycin ointment from your pharmacy to apply as directed WOUND #1: - Toe Great Wound Laterality: Right, Circumferential Cleanser: Byram Ancillary Kit - 15 Day Supply (DME) (Generic) 1 x Per Day/15 Days Discharge Instructions: Use supplies as instructed; Kit contains: (15) Saline Bullets; (15) 3x3 Gauze; 15 pr Gloves Cleanser:  Normal Saline 1 x Per Day/15 Days Discharge Instructions: Wash your hands with soap and water. Remove old dressing, discard into plastic bag and place into trash. Cleanse the wound with Normal Saline prior to applying a clean dressing using gauze sponges, not tissues or cotton balls. Do not scrub or use excessive force. Pat dry using gauze sponges, not tissue or cotton balls. Topical: Gentamicin 1 x Per Day/15 Days Discharge Instructions: Apply as directed by provider. Primary Dressing: Gauze (DME) (Generic) 1 x Per Day/15 Days Secondary Dressing: Conforming Guaze Roll-Medium (DME) (Generic) 1 x Per Day/15 Days Discharge Instructions: Apply Conforming Stretch Guaze Bandage as directed Secured With: 3M Medipore H Soft Cloth Surgical Tape, 2x2 (in/yd) (DME) (Generic) 1 x Per Day/15 Days 1. Gentamicin ointment 2. Right foot x-ray 3. Follow-up in 1 week Electronic Signature(s) Signed: 12/19/2020 1:56:55 PM By: Kalman Shan DO Entered By: Kalman Shan on 12/19/2020 13:56:26 Ishaq, Rickiya A. ([id number]) -------------------------------------------------------------------------------- SuperBill Details Patient Name: Flow, Aleese A. Date of Service: 12/19/2020 Medical Record Number: [id number] Patient Account Number: [id number] Date of Birth/Sex: November 04, 1965 (55 y.o. F) Treating RN: Donnamarie Poag Primary Care Provider: Nobie Putnam Other Clinician: Referring Provider: Nobie Putnam Treating Provider/Extender: Yaakov Guthrie in Treatment: 1 Diagnosis Coding ICD-10 Codes Code Description [phone] Type 2 diabetes mellitus with foot ulcer L97.511 Non-pressure chronic ulcer of other part of right foot limited to breakdown of skin J44.9 Chronic obstructive pulmonary disease, unspecified L03.115 Cellulitis of right lower limb Facility Procedures CPT4 Code: [id number] Description: [id number] - WOUND CARE VISIT-LEV 2 NEW PT Modifier: Quantity: 1 Physician Procedures CPT4  Code: [id number] Description: 99213 - WC PHYS LEVEL 3 - EST PT Modifier: Quantity: 1 CPT4 Code: Description: ICD-10 Diagnosis Description L97.511 Non-pressure chronic ulcer of other part of right foot limited to break E11.621 Type 2 diabetes mellitus with foot ulcer L03.115 Cellulitis of right lower limb Modifier: down of skin Quantity: Electronic Signature(s) Signed: 12/19/2020 1:56:55 PM By: Kalman Shan DO Signed: 12/19/2020 4:15:55 PM By: Donnamarie Poag Entered ByDonnamarie Poag on 12/19/2020 13:55:09

## 2020-12-20 DIAGNOSIS — L97511 Non-pressure chronic ulcer of other part of right foot limited to breakdown of skin: Secondary | ICD-10-CM | POA: Diagnosis not present

## 2020-12-20 DIAGNOSIS — E11621 Type 2 diabetes mellitus with foot ulcer: Secondary | ICD-10-CM | POA: Diagnosis not present

## 2020-12-24 ENCOUNTER — Telehealth: Payer: Self-pay

## 2020-12-24 DIAGNOSIS — I6930 Unspecified sequelae of cerebral infarction: Secondary | ICD-10-CM

## 2020-12-24 MED ORDER — CLOPIDOGREL BISULFATE 75 MG PO TABS: 75.0000 mg | ORAL_TABLET | Freq: Every day | ORAL | 3 refills | Status: DC

## 2020-12-24 NOTE — Telephone Encounter (Signed)
Ok refilled  Nobie Putnam, [facility] 12/24/2020, 4:27 PM

## 2020-12-24 NOTE — Telephone Encounter (Signed)
Copied from [place] [phone]. Topic: General - Inquiry >> Dec 24, 2020  3:09 PM Loma [place] wrote: clopidogrel (PLAVIX) 75 MG tablet   03/18/2017   Sig - Route: Take 75 mg by mouth daily. - Oral  Class: Historical Med [id number] - [place], [place] AT [facility] [address] [place] [address]  Pt states has taken for last 54 years and wants Dr Raliegh Ip to add to med list and refill /not on pinned drugs   It was last prescribed in 2019

## 2020-12-26 ENCOUNTER — Other Ambulatory Visit: Payer: Self-pay

## 2020-12-26 ENCOUNTER — Encounter (HOSPITAL_BASED_OUTPATIENT_CLINIC_OR_DEPARTMENT_OTHER): Payer: Medicare Other | Admitting: Internal Medicine

## 2020-12-26 DIAGNOSIS — E11621 Type 2 diabetes mellitus with foot ulcer: Secondary | ICD-10-CM

## 2020-12-26 DIAGNOSIS — L97511 Non-pressure chronic ulcer of other part of right foot limited to breakdown of skin: Secondary | ICD-10-CM

## 2020-12-26 DIAGNOSIS — I69951 Hemiplegia and hemiparesis following unspecified cerebrovascular disease affecting right dominant side: Secondary | ICD-10-CM | POA: Diagnosis not present

## 2020-12-26 DIAGNOSIS — L03115 Cellulitis of right lower limb: Secondary | ICD-10-CM | POA: Diagnosis not present

## 2020-12-26 DIAGNOSIS — E1151 Type 2 diabetes mellitus with diabetic peripheral angiopathy without gangrene: Secondary | ICD-10-CM | POA: Diagnosis not present

## 2020-12-26 DIAGNOSIS — E1140 Type 2 diabetes mellitus with diabetic neuropathy, unspecified: Secondary | ICD-10-CM | POA: Diagnosis not present

## 2020-12-26 DIAGNOSIS — J449 Chronic obstructive pulmonary disease, unspecified: Secondary | ICD-10-CM

## 2020-12-26 NOTE — Progress Notes (Signed)
WILLMA, OBANDO ([id number]) Visit Report for 12/26/2020 Arrival Information Details Patient Name: [name], [name]. Date of Service: 12/26/2020 1:45 PM Medical Record Number: [id number] Patient Account Number: [id number] Date of Birth/Sex: 1965-01-24 (55 y.o. F) Treating RN: Donnamarie Poag Primary Care : Nobie Putnam Other Clinician: Referring : Nobie Putnam Treating /Extender: Yaakov Guthrie in Treatment: 2 Visit Information History Since Last Visit Added or deleted any medications: No Patient Arrived: Wheel Chair Had [name] fall or experienced change in No Arrival Time: 13:55 activities of daily living that may affect Accompanied By: self risk of falls: Transfer Assistance: EasyPivot Patient Lift Hospitalized since last visit: No Patient Identification Verified: Yes Has Dressing in Place as Prescribed: Yes Secondary Verification Process Completed: Yes Has Footwear/Offloading in Place as Prescribed: Yes Patient Requires Transmission-Based No Left: Other: Precautions: Pain Present Now: Yes Patient Has Alerts: Yes Patient Alerts: Patient on Blood Thinner Plavix Type II Diabetic 12/12/20 Patient [place] >220 Right Leg Electronic Signature(s) Signed: 12/26/2020 2:22:35 PM By: Donnamarie Poag Entered By: Donnamarie Poag on 12/26/2020 13:57:08 [name], [name]. ([id number]) -------------------------------------------------------------------------------- Clinic Level of Care Assessment Details Patient Name: [name]. Date of Service: 12/26/2020 1:45 PM Medical Record Number: [id number] Patient Account Number: [id number] Date of Birth/Sex: [birth year] (55 y.o. F) Treating RN: Donnamarie Poag Primary Care : Nobie Putnam Other Clinician: Referring : Nobie Putnam Treating /Extender: Yaakov Guthrie in Treatment: 2 Clinic Level of Care Assessment Items TOOL 4 Quantity Score [] - Use when only an  EandM is performed on FOLLOW-UP visit 0 ASSESSMENTS - Nursing Assessment / Reassessment [] - Reassessment of Co-morbidities (includes updates in patient status) 0 [] - 0 Reassessment of Adherence to Treatment Plan ASSESSMENTS - Wound and Skin Assessment / Reassessment X - Simple Wound Assessment / Reassessment - one wound 1 5 [] - 0 Complex Wound Assessment / Reassessment - multiple wounds [] - 0 Dermatologic / Skin Assessment (not related to wound area) ASSESSMENTS - Focused Assessment [] - Circumferential Edema Measurements - multi extremities 0 [] - 0 Nutritional Assessment / Counseling / Intervention [] - 0 Lower Extremity Assessment (monofilament, tuning fork, pulses) [] - 0 Peripheral Arterial Disease Assessment (using hand held doppler) ASSESSMENTS - Ostomy and/or Continence Assessment and Care [] - Incontinence Assessment and Management 0 [] - 0 Ostomy Care Assessment and Management (repouching, etc.) PROCESS - Coordination of Care X - Simple Patient / Family Education for ongoing care 1 15 [] - 0 Complex (extensive) Patient / Family Education for ongoing care [] - 0 Staff obtains Programmer, systems, Records, Test Results / Process Orders [] - 0 Staff telephones HHA, Nursing Homes / Clarify orders / etc [] - 0 Routine Transfer to another Facility (non-emergent condition) [] - 0 Routine Hospital Admission (non-emergent condition) [] - 0 New Admissions / Biomedical engineer / Ordering NPWT, Apligraf, etc. [] - 0 Emergency Hospital Admission (emergent condition) X- 1 10 Simple Discharge Coordination [] - 0 Complex (extensive) Discharge Coordination PROCESS - Special Needs [] - Pediatric / Minor Patient Management 0 [] - 0 Isolation Patient Management [] - 0 Hearing / Language / Visual special needs [] - 0 Assessment of Community assistance (transportation, D/C planning, etc.) [] - 0 Additional assistance / Altered mentation [] - 0 Support Surface(s) Assessment  (bed, cushion, seat, etc.) INTERVENTIONS - Wound Cleansing / Measurement [name]. ([id number]) X- 1 5 Simple Wound Cleansing - one wound [] - 0 Complex Wound Cleansing - multiple wounds X- 1 5 Wound Imaging (photographs -  any number of wounds) [] - 0 Wound Tracing (instead of photographs) X- 1 5 Simple Wound Measurement - one wound [] - 0 Complex Wound Measurement - multiple wounds INTERVENTIONS - Wound Dressings X - Small Wound Dressing one or multiple wounds 1 10 [] - 0 Medium Wound Dressing one or multiple wounds [] - 0 Large Wound Dressing one or multiple wounds X- 1 5 Application of Medications - topical [] - 0 Application of Medications - injection INTERVENTIONS - Miscellaneous [] - External ear exam 0 [] - 0 Specimen Collection (cultures, biopsies, blood, body fluids, etc.) [] - 0 Specimen(s) / Culture(s) sent or taken to Lab for analysis [] - 0 Patient Transfer (multiple staff / Civil Service fast streamer / Similar devices) [] - 0 Simple Staple / Suture removal (25 or less) [] - 0 Complex Staple / Suture removal (26 or more) [] - 0 Hypo / Hyperglycemic Management (close monitor of Blood Glucose) [] - 0 Ankle / Brachial Index (ABI) - do not check if billed separately X- 1 5 Vital Signs Has the patient been seen at the hospital within the last three years: Yes Total Score: 65 Level Of Care: New/Established - Level 2 Electronic Signature(s) Signed: 12/26/2020 2:22:35 PM By: Donnamarie Poag Entered By: Donnamarie Poag on 12/26/2020 14:08:09 [name], [name]. ([id number]) -------------------------------------------------------------------------------- Encounter Discharge Information Details Patient Name: [name]. Date of Service: 12/26/2020 1:45 PM Medical Record Number: [id number] Patient Account Number: [id number] Date of Birth/Sex: April 13, 1965 (55 y.o. F) Treating RN: Donnamarie Poag Primary Care : Nobie Putnam Other Clinician: Referring :  Nobie Putnam Treating /Extender: Yaakov Guthrie in Treatment: 2 Encounter Discharge Information Items Discharge Condition: Stable Ambulatory Status: Wheelchair Discharge Destination: Home Transportation: Private Auto Accompanied By: self Schedule Follow-up Appointment: Yes Clinical Summary of Care: Electronic Signature(s) Signed: 12/26/2020 2:22:35 PM By: Donnamarie Poag Entered By: Donnamarie Poag on 12/26/2020 14:15:51 [name], [name]. ([id number]) -------------------------------------------------------------------------------- Lower Extremity Assessment Details Patient Name: [name], [name]. Date of Service: 12/26/2020 1:45 PM Medical Record Number: [id number] Patient Account Number: [id number] Date of Birth/Sex: February 21, 1965 (55 y.o. F) Treating RN: Donnamarie Poag Primary Care : Nobie Putnam Other Clinician: Referring : Nobie Putnam Treating /Extender: Yaakov Guthrie in Treatment: 2 Edema Assessment Assessed: Shirlyn Goltz: Yes] [Right: No] Edema: [Left: N] [Right: o] Vascular Assessment Pulses: Dorsalis Pedis Palpable: [Left:Yes] Electronic Signature(s) Signed: 12/26/2020 2:22:35 PM By: Donnamarie Poag Entered By: Donnamarie Poag on 12/26/2020 14:02:01 [name], [name]. ([id number]) -------------------------------------------------------------------------------- Multi Wound Chart Details Patient Name: [name], [name]. Date of Service: 12/26/2020 1:45 PM Medical Record Number: [id number] Patient Account Number: [id number] Date of Birth/Sex: [birth year] (55 y.o. 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 Comorbid History: Chronic Obstructive Pulmonary N/[name] N/[name] Disease (COPD), Type II Diabetes, Osteoarthritis, Neuropathy Date Acquired: 10/22/2020 N/[name] N/[name] Weeks of Treatment: 2 N/[name] N/[name] Wound Status: Open N/[name] N/[name] Measurements L x W x D (cm) 0.1x0.1x0.1 N/[name] N/[name] Area (cm) : 0.008 N/[name] N/[name] Volume (cm) : 0.001 N/[name] N/[name] % Reduction in Area: 0.00% N/[name] N/[name] % Reduction in Volume: 0.00% N/[name] N/[name] Classification: Unable to visualize wound bed N/[name] N/[name] Exudate Amount: Medium N/[name] N/[name]  Exudate Type: Serous N/[name] N/[name] Exudate Color: amber N/[name] N/[name] Granulation Amount: Large (67-100%) N/[name] N/[name] Granulation Quality: Red, Pink N/[name] N/[name] Necrotic Amount: Small (1-33%) N/[name] N/[name] Necrotic Tissue: Eschar N/[name] N/[name] Treatment Notes Wound #1 (Toe Great) Wound Laterality: Right Cleanser [place] Ancillary Kit - 15 Day Supply Discharge Instruction: Use supplies as instructed; Kit contains: (15) Saline Bullets; (15) 3x3 Gauze; 15 pr Gloves Normal Saline Discharge Instruction: Wash your hands with soap and water. Remove old dressing, discard into plastic bag and place into trash. Cleanse the wound with Normal Saline prior to applying [name] clean dressing using gauze sponges, not tissues or cotton balls. Do not scrub or use excessive force. Pat dry using gauze sponges, not tissue or cotton balls. Peri-Wound Care Topical Gentamicin Molenda, Jodel [name]. ([id number]) Discharge Instruction: Apply as directed by . Primary Dressing Gauze Secondary Dressing [place] Roll-Medium Discharge Instruction: Apply Conforming Stretch Guaze Bandage as directed Secured With 43M Medipore H Soft Cloth Surgical Tape, 2x2 (in/yd) Compression Wrap Compression Stockings Add-Ons Electronic Signature(s) Signed: 12/26/2020 2:19:58 PM  By: Kalman Shan DO Entered By: Kalman Shan on 12/26/2020 14:18:28 [name], Deanna AMarland Kitchen ([id number]) -------------------------------------------------------------------------------- Multi-Disciplinary Care Plan Details Patient Name: Litton, Babe [name]. Date of Service: 12/26/2020 1:45 PM Medical Record Number: [id number] Patient Account Number: [id number] Date of Birth/Sex: [birth year] (55 y.o. F) Treating RN: Donnamarie Poag Primary Care : Nobie Putnam Other Clinician: Referring : Nobie Putnam Treating /Extender: Yaakov Guthrie in Treatment: 2 Active Inactive Wound/Skin Impairment Nursing Diagnoses: Impaired tissue integrity Goals: Patient/caregiver will verbalize understanding of skin care regimen Date Initiated: 12/12/2020 Target Resolution Date: 12/12/2020 Goal Status: Active Interventions: Assess ulceration(s) every visit Provide education on smoking Treatment Activities: Topical wound management initiated : 12/12/2020 Notes: Electronic Signature(s) Signed: 12/26/2020 2:22:35 PM By: Donnamarie Poag Entered By: Donnamarie Poag on 12/26/2020 14:02:30 [name], [name] ([id number]) -------------------------------------------------------------------------------- Pain Assessment Details Patient Name: [name], [name]. Date of Service: 12/26/2020 1:45 PM Medical Record Number: [id number] Patient Account Number: [id number] Date of Birth/Sex: 1965-09-05 (55 y.o. F) Treating RN: Donnamarie Poag Primary Care : Nobie Putnam Other Clinician: Referring : Nobie Putnam Treating /Extender: Yaakov Guthrie in Treatment: 2 Active Problems Location of Pain Severity and Description of Pain Patient Has Paino Yes Site Locations Pain Location: Generalized Pain, Pain in Ulcers Rate the pain. Current Pain Level: 7 Pain Management and Medication Current Pain Management: Electronic Signature(s) Signed:  12/26/2020 2:22:35 PM By: Donnamarie Poag Entered By: Donnamarie Poag on 12/26/2020 13:57:39 [name], [name]. ([id number]) -------------------------------------------------------------------------------- Patient/Caregiver Education Details Patient Name: [name], [name]. Date of Service: 12/26/2020 1:45 PM Medical Record Number: [id number] Patient Account Number: [id number] Date of Birth/Gender: 1965-12-31 (55 y.o. F) Treating RN: Donnamarie Poag Primary Care Physician: Nobie Putnam Other Clinician: Referring Physician: Nobie Putnam Treating Physician/Extender: Yaakov Guthrie in Treatment: 2 Education Assessment Education Provided To: Patient Education Topics Provided Basic Hygiene: Elevated Blood Sugar/ Impact on Healing: Smoking and Wound Healing: Wound/Skin Impairment: Electronic Signature(s) Signed: 12/26/2020 2:22:35 PM By: Donnamarie Poag Entered By: Donnamarie Poag on 12/26/2020 14:08:28 [name], [name]. ([id number]) -------------------------------------------------------------------------------- Wound Assessment Details Patient Name: [name], Kaimana [name]. Date of Service: 12/26/2020 1:45 PM Medical Record Number: [id number] Patient Account Number: [id number] Date of Birth/Sex: [birth year] (55 y.o. F) Treating RN: Donnamarie Poag Primary Care : Nobie Putnam Other Clinician: Referring : Nobie Putnam Treating /Extender: Yaakov Guthrie in Treatment: 2 Wound Status Wound Number: 1 Primary Diabetic Wound/Ulcer of the Lower Extremity Etiology: Wound Location: Right, Circumferential Toe Great Wound Open Wounding Event: Gradually  Appeared Status: Date Acquired: 10/22/2020 Comorbid Chronic Obstructive Pulmonary Disease (COPD), Type II Weeks Of Treatment: 2 History: Diabetes, Osteoarthritis, Neuropathy Clustered Wound: No Photos Wound Measurements Length: (cm) 0.1 Width: (cm) 0.1 Depth: (cm) 0.1 Area: (cm) 0.008 Volume:  (cm) 0.001 % Reduction in Area: 0% % Reduction in Volume: 0% Tunneling: No Undermining: No Wound Description Classification: Unable to visualize wound bed Exudate Amount: Medium Exudate Type: Serous Exudate Color: amber Foul Odor After Cleansing: No Slough/Fibrino No Wound Bed Granulation Amount: Large (67-100%) Granulation Quality: Red, Pink Necrotic Amount: Small (1-33%) Necrotic Quality: Eschar Electronic Signature(s) Signed: 12/26/2020 2:22:35 PM By: Donnamarie Poag Entered By: Donnamarie Poag on 12/26/2020 14:01:16 Karren, Zaryah [name]. ([id number]) -------------------------------------------------------------------------------- Vitals Details Patient Name: Mcguiness, Hetty [name]. Date of Service: 12/26/2020 1:45 PM Medical Record Number: [id number] Patient Account Number: [id number] Date of Birth/Sex: May 14, 1965 (55 y.o. F) Treating RN: Donnamarie Poag Primary Care Arish Redner: Nobie Putnam Other Clinician: Referring Braxston Quinter: Nobie Putnam Treating Krystl Wickware/Extender: Yaakov Guthrie in Treatment: 2 Vital Signs Time Taken: 13:56 Temperature (F): 97.6 Height (in): 66 Pulse (bpm): 84 Weight (lbs): 255 Respiratory Rate (breaths/min): 16 Body Mass Index (BMI): 41.2 Blood Pressure (mmHg): 146/90 Reference Range: 80 - 120 mg / dl Electronic Signature(s) Signed: 12/26/2020 2:22:35 PM By: Donnamarie Poag Entered ByDonnamarie Poag on 12/26/2020 13:57:23

## 2020-12-26 NOTE — Progress Notes (Addendum)
MESSINA, KOSINSKI ([id number]) Visit Report for 12/26/2020 Chief Complaint Document Details Patient Name: [name], [name]. Date of Service: 12/26/2020 1:45 PM Medical Record Number: [id number] Patient Account Number: [id number] Date of Birth/Sex: Mar 21, 1965 (55 y.o. F) Treating RN: Donnamarie Poag Primary Care Provider: Nobie Putnam Other Clinician: Referring Provider: Nobie Putnam Treating Provider/Extender: Yaakov Guthrie in Treatment: 2 Information Obtained from: Patient Chief Complaint Right foot wound - 1st toe Electronic Signature(s) Signed: 12/26/2020 2:19:58 PM By: Kalman Shan DO Entered By: Kalman Shan on 12/26/2020 14:09:15 [name], Deanna. ([id number]) -------------------------------------------------------------------------------- HPI Details Patient Name: [name], Areana [name]. Date of Service: 12/26/2020 1:45 PM Medical Record Number: [id number] Patient Account Number: [id number] Date of Birth/Sex: 07-Jun-1965 (55 y.o. F) Treating RN: Donnamarie Poag Primary Care Provider: Nobie Putnam Other Clinician: Referring Provider: Nobie Putnam Treating Provider/Extender: Yaakov Guthrie in Treatment: 2 History of Present Illness HPI Description: Admission 12/12/2020 Ms. [name] Dorough is [name] 55 year old female with [name] past medical history of uncontrolled insulin-dependent type 2 diabetes with most recent hemoglobin A1c of 12 and COPD that presents to the clinic for [name] right great toe wound. She has had issues with her right great toe since August 2022. She has been evaluated by her primary care physician for this issue and been treated for wound infection with clindamycin and Levaquin and topical mupirocin on 09/21/2020. She had [name] right foot x-ray due to [name] sprained ankle injury on 10/10 that showed no bony abnormality. She presents today because she states that her right great toe Has become red and swollen over the past week. She has had  drainage from the toe but no open wound. 12/7; patient presents for 1 week follow-up. She is taken 4 days worth of clindamycin at this point. She reports stability in her symptoms. She has been using Neosporin to the wound bed. She is not sure how the silver alginate works so she has not been putting it on. 12/14; patient presents for 1 week follow-up. She has been using gentamicin cream to the wound beds. She finished clindamycin. She has not been able to obtain the right foot x-ray. She currently denies signs of infection. She continues to have chronic pain. Electronic Signature(s) Signed: 12/26/2020 2:19:58 PM By: Kalman Shan DO Entered By: Kalman Shan on 12/26/2020 14:09:48 Rumsey, Glema AMarland Kitchen ([id number]) -------------------------------------------------------------------------------- Physical Exam Details Patient Name: [name], Khalia [name]. Date of Service: 12/26/2020 1:45 PM Medical Record Number: [id number] Patient Account Number: [id number] Date of Birth/Sex: [birth year] (55 y.o. F) Treating RN: Donnamarie Poag Primary Care Provider: Nobie Putnam Other Clinician: Referring Provider: Nobie Putnam Treating Provider/Extender: Yaakov Guthrie in Treatment: 2 Constitutional . Cardiovascular . Psychiatric . Notes Right foot: To the right great toe there are areas of skin breakdown. Electronic Signature(s) Signed: 12/26/2020 2:19:58 PM By: Kalman Shan DO Entered By: Kalman Shan on 12/26/2020 14:10:18 Soderquist, [place] AMarland Kitchen ([id number]) -------------------------------------------------------------------------------- Physician Orders Details Patient Name: [name], Rithika [name]. Date of Service: 12/26/2020 1:45 PM Medical Record Number: [id number] Patient Account Number: [id number] Date of Birth/Sex: 09/06/1965 (55 y.o. F) Treating RN: Donnamarie Poag Primary Care Provider: Nobie Putnam Other Clinician: Referring Provider: Nobie Putnam Treating  Provider/Extender: Yaakov Guthrie in Treatment: 2 Verbal / Phone Orders: No Diagnosis Coding Follow-up Appointments Wound #1 Right Toe Great o Return Appointment in 1 week. o Other: - Recommend go get foot xray as transport available Bathing/ Shower/ Hygiene o May shower; gently cleanse wound with antibacterial soap, rinse and pat dry prior to dressing wounds o No tub bath. Anesthetic (  Use 'Patient Medications' Section for Anesthetic Order Entry) o Lidocaine applied to wound bed Edema Control - Lymphedema / Segmental Compressive Device / Other o Elevate leg(s) parallel to the floor when sitting. o DO YOUR BEST to sleep in the bed at night. DO NOT sleep in your recliner. Long hours of sitting in [name] recliner leads to swelling of the legs and/or potential wounds on your backside. Off-Loading o Open toe surgical shoe - keep pressure off of irritated wound areas Additional Orders / Instructions o Decrease/Stop Smoking o Follow Nutritious Diet and Increase Protein Intake Medications-Please add to medication list. o P.O. Antibiotics - continue as prescribed until finished Wound Treatment Wound #1 - Toe Great Wound Laterality: Right Cleanser: Byram Ancillary Kit - 15 Day Supply (Generic) 1 x Per Day/15 Days Discharge Instructions: Use supplies as instructed; Kit contains: (15) Saline Bullets; (15) 3x3 Gauze; 15 pr Gloves Cleanser: Normal Saline 1 x Per Day/15 Days Discharge Instructions: Wash your hands with soap and water. Remove old dressing, discard into plastic bag and place into trash. Cleanse the wound with Normal Saline prior to applying [name] clean dressing using gauze sponges, not tissues or cotton balls. Do not scrub or use excessive force. Pat dry using gauze sponges, not tissue or cotton balls. Topical: Gentamicin 1 x Per Day/15 Days Discharge Instructions: Apply as directed by provider. Primary Dressing: Gauze (Generic) 1 x Per KWI/09 Days Secondary  Dressing: Conforming Guaze Roll-Medium (Generic) 1 x Per Day/15 Days Discharge Instructions: Apply Conforming Stretch Guaze Bandage as directed Secured With: 29M Medipore H Soft Cloth Surgical Tape, 2x2 (in/yd) (Generic) 1 x Per Day/15 Days Patient Medications Allergies: Augmentin, Toradol Notifications Medication Indication Start End gentamicin 12/27/2020 DOSE 1 - topical 0.1 % cream - 1 application daily Sinatra, Mita [name]. ([id number]) Electronic Signature(s) Signed: 12/27/2020 2:33:17 PM By: Kalman Shan DO Previous Signature: 12/26/2020 2:19:58 PM Version By: Kalman Shan DO Entered By: Kalman Shan on 12/27/2020 14:33:16 Babula, [place]. ([id number]) -------------------------------------------------------------------------------- Problem List Details Patient Name: Pemberton, Ixchel [name]. Date of Service: 12/26/2020 1:45 PM Medical Record Number: [id number] Patient Account Number: [id number] Date of Birth/Sex: 05-09-1965 (55 y.o. F) Treating RN: Donnamarie Poag Primary Care Provider: Nobie Putnam Other Clinician: Referring Provider: Nobie Putnam Treating Provider/Extender: Yaakov Guthrie in Treatment: 2 Active Problems ICD-10 Encounter Code Description Active Date MDM Diagnosis L97.511 Non-pressure chronic ulcer of other part of right foot limited to 12/12/2020 No Yes breakdown of skin E11.621 Type 2 diabetes mellitus with foot ulcer 12/12/2020 No Yes J44.9 Chronic obstructive pulmonary disease, unspecified 12/12/2020 No Yes Inactive Problems Resolved Problems ICD-10 Code Description Active Date Resolved Date L03.115 Cellulitis of right lower limb 12/12/2020 12/12/2020 Electronic Signature(s) Signed: 12/26/2020 2:19:58 PM By: Kalman Shan DO Entered By: Kalman Shan on 12/26/2020 14:09:10 Nahar, Guys. ([id number]) -------------------------------------------------------------------------------- Progress Note Details Patient Name: Werntz,  Asta [name]. Date of Service: 12/26/2020 1:45 PM Medical Record Number: [id number] Patient Account Number: [id number] Date of Birth/Sex: [birth year] (55 y.o. F) Treating RN: Donnamarie Poag Primary Care Provider: Nobie Putnam Other Clinician: Referring Provider: Nobie Putnam Treating Provider/Extender: Yaakov Guthrie in Treatment: 2 Subjective Chief Complaint Information obtained from Patient Right foot wound - 1st toe History of Present Illness (HPI) Admission 12/12/2020 Ms. Ciaira Natividad is [name] 55 year old female with [name] past medical history of uncontrolled insulin-dependent type 2 diabetes with most recent hemoglobin A1c of 12 and COPD that presents to the clinic for [name] right great toe wound. She has had issues with her right great toe since  August 2022. She has been evaluated by her primary care physician for this issue and been treated for wound infection with clindamycin and Levaquin and topical mupirocin on 09/21/2020. She had [name] right foot x-ray due to [name] sprained ankle injury on 10/10 that showed no bony abnormality. She presents today because she states that her right great toe Has become red and swollen over the past week. She has had drainage from the toe but no open wound. 12/7; patient presents for 1 week follow-up. She is taken 4 days worth of clindamycin at this point. She reports stability in her symptoms. She has been using Neosporin to the wound bed. She is not sure how the silver alginate works so she has not been putting it on. 12/14; patient presents for 1 week follow-up. She has been using gentamicin cream to the wound beds. She finished clindamycin. She has not been able to obtain the right foot x-ray. She currently denies signs of infection. She continues to have chronic pain. Patient History Information obtained from Patient, Chart. Social History Current every day smoker, Marital Status - Married, Alcohol Use - Never, Drug Use - Prior History, Caffeine  Use - Never. Medical History Respiratory Patient has history of Chronic Obstructive Pulmonary Disease (COPD) Endocrine Patient has history of Type II Diabetes Musculoskeletal Patient has history of Osteoarthritis Neurologic Patient has history of Neuropathy - feet, right arm Medical And Surgical History Notes Neurologic Stroke 2013 Objective Constitutional Vitals Time Taken: 1:56 PM, Height: 66 in, Weight: 255 lbs, BMI: 41.2, Temperature: 97.6 F, Pulse: 84 bpm, Respiratory Rate: 16 breaths/min, Blood Pressure: 146/90 mmHg. General Notes: Right foot: To the right great toe there are areas of skin breakdown. Integumentary (Hair, Skin) Wound #1 status is Open. Original cause of wound was Gradually Appeared. The date acquired was: 10/22/2020. The wound has been in treatment 2 weeks. The wound is located on the Right Toe Great. The wound measures 0.1cm length x 0.1cm width x 0.1cm depth; 0.008cm^2 Thelen, Kimberley [name]. ([id number]) area and 0.001cm^3 volume. There is no tunneling or undermining noted. There is [name] medium amount of serous drainage noted. There is large (67-100%) red, pink granulation within the wound bed. There is [name] small (1-33%) amount of necrotic tissue within the wound bed including Eschar. Assessment Active Problems ICD-10 Non-pressure chronic ulcer of other part of right foot limited to breakdown of skin Type 2 diabetes mellitus with foot ulcer Chronic obstructive pulmonary disease, unspecified Patient's wounds have shown improvement in size and appearance since last clinic visit. No signs of infection on exam. At this time I recommended continuing gentamicin ointment. She states she will obtain the right foot x-ray this week. Plan Follow-up Appointments: Wound #1 Right Toe Great: Return Appointment in 1 week. Other: - Recommend go get foot xray as transport available Bathing/ Shower/ Hygiene: May shower; gently cleanse wound with antibacterial soap, rinse and pat dry  prior to dressing wounds No tub bath. Anesthetic (Use 'Patient Medications' Section for Anesthetic Order Entry): Lidocaine applied to wound bed Edema Control - Lymphedema / Segmental Compressive Device / Other: Elevate leg(s) parallel to the floor when sitting. DO YOUR BEST to sleep in the bed at night. DO NOT sleep in your recliner. Long hours of sitting in [name] recliner leads to swelling of the legs and/or potential wounds on your backside. Off-Loading: Open toe surgical shoe - keep pressure off of irritated wound areas Additional Orders / Instructions: Decrease/Stop Smoking Follow Nutritious Diet and Increase Protein Intake Medications-Please add to  medication list.: P.O. Antibiotics - continue as prescribed until finished WOUND #1: - Toe Great Wound Laterality: Right Cleanser: Byram Ancillary Kit - 15 Day Supply (Generic) 1 x Per Day/15 Days Discharge Instructions: Use supplies as instructed; Kit contains: (15) Saline Bullets; (15) 3x3 Gauze; 15 pr Gloves Cleanser: Normal Saline 1 x Per Day/15 Days Discharge Instructions: Wash your hands with soap and water. Remove old dressing, discard into plastic bag and place into trash. Cleanse the wound with Normal Saline prior to applying [name] clean dressing using gauze sponges, not tissues or cotton balls. Do not scrub or use excessive force. Pat dry using gauze sponges, not tissue or cotton balls. Topical: Gentamicin 1 x Per Day/15 Days Discharge Instructions: Apply as directed by provider. Primary Dressing: Gauze (Generic) 1 x Per Day/15 Days Secondary Dressing: Conforming Guaze Roll-Medium (Generic) 1 x Per Day/15 Days Discharge Instructions: Apply Conforming Stretch Guaze Bandage as directed Secured With: 43M Medipore H Soft Cloth Surgical Tape, 2x2 (in/yd) (Generic) 1 x Per Day/15 Days 1. Gentamicin ointment 2. Follow-up in 1 week Electronic Signature(s) Signed: 12/26/2020 2:19:58 PM By: Kalman Shan DO Entered By: Kalman Shan on  12/26/2020 14:19:18 Gerding, Shakeera [name]. ([id number]) -------------------------------------------------------------------------------- ROS/PFSH Details Patient Name: Guarnieri, Moet [name]. Date of Service: 12/26/2020 1:45 PM Medical Record Number: [id number] Patient Account Number: [id number] Date of Birth/Sex: March 07, 1965 (55 y.o. F) Treating RN: Donnamarie Poag Primary Care Provider: Nobie Putnam Other Clinician: Referring Provider: Nobie Putnam Treating Provider/Extender: Yaakov Guthrie in Treatment: 2 Information Obtained From Patient Chart Respiratory Medical History: Positive for: Chronic Obstructive Pulmonary Disease (COPD) Endocrine Medical History: Positive for: Type II Diabetes Time with diabetes: 10 years Treated with: Oral agents Blood sugar tested every day: Yes Tested : 2 Blood sugar testing results: Breakfast: 314 Musculoskeletal Medical History: Positive for: Osteoarthritis Neurologic Medical History: Positive for: Neuropathy - feet, right arm Past Medical History Notes: Stroke 2013 Immunizations Pneumococcal Vaccine: Received Pneumococcal Vaccination: Yes Received Pneumococcal Vaccination On or After 60th Birthday: No Tetanus Vaccine: Last tetanus shot: 01/14/2011 Implantable Devices None Family and Social History Current every day smoker; Marital Status - Married; Alcohol Use: Never; Drug Use: Prior History; Caffeine Use: Never Electronic Signature(s) Signed: 12/26/2020 2:19:58 PM By: Kalman Shan DO Signed: 12/26/2020 2:22:35 PM By: Donnamarie Poag Entered By: Kalman Shan on 12/26/2020 14:18:19 Strandberg, Vaanya [name]. ([id number]) -------------------------------------------------------------------------------- SuperBill Details Patient Name: Tarquinio, Michayla [name]. Date of Service: 12/26/2020 Medical Record Number: [id number] Patient Account Number: [id number] Date of Birth/Sex: 08-Apr-1965 (55 y.o. F) Treating RN: Donnamarie Poag Primary Care  Provider: Nobie Putnam Other Clinician: Referring Provider: Nobie Putnam Treating Provider/Extender: Yaakov Guthrie in Treatment: 2 Diagnosis Coding ICD-10 Codes Code Description [phone] Type 2 diabetes mellitus with foot ulcer L97.511 Non-pressure chronic ulcer of other part of right foot limited to breakdown of skin J44.9 Chronic obstructive pulmonary disease, unspecified L03.115 Cellulitis of right lower limb Facility Procedures CPT4 Code: [id number] Description: [phone] - WOUND CARE VISIT-LEV 2 EST PT Modifier: Quantity: 1 Physician Procedures CPT4 Code: [id number] Description: 25638 - WC PHYS LEVEL 3 - EST PT Modifier: Quantity: 1 CPT4 Code: Description: ICD-10 Diagnosis Description L97.511 Non-pressure chronic ulcer of other part of right foot limited to break E11.621 Type 2 diabetes mellitus with foot ulcer J44.9 Chronic obstructive pulmonary disease, unspecified Modifier: down of skin Quantity: Electronic Signature(s) Signed: 12/26/2020 2:19:58 PM By: Kalman Shan DO Entered By: Kalman Shan on 12/26/2020 14:17:46

## 2020-12-29 DIAGNOSIS — M961 Postlaminectomy syndrome, not elsewhere classified: Secondary | ICD-10-CM | POA: Diagnosis not present

## 2020-12-29 DIAGNOSIS — M25561 Pain in right knee: Secondary | ICD-10-CM | POA: Diagnosis not present

## 2020-12-29 DIAGNOSIS — G894 Chronic pain syndrome: Secondary | ICD-10-CM | POA: Diagnosis not present

## 2020-12-31 ENCOUNTER — Ambulatory Visit: Payer: Self-pay

## 2020-12-31 ENCOUNTER — Ambulatory Visit (INDEPENDENT_AMBULATORY_CARE_PROVIDER_SITE_OTHER): Payer: Medicare Other | Admitting: Pharmacist

## 2020-12-31 DIAGNOSIS — E1169 Type 2 diabetes mellitus with other specified complication: Secondary | ICD-10-CM

## 2020-12-31 NOTE — Patient Instructions (Signed)
Visit Information  Thank you for taking time to visit with me today. Please don't hesitate to contact me if I can be of assistance to you before our next scheduled telephone appointment.  Following are the goals we discussed today:   Goals Addressed             This Visit's Progress    Pharmacy Goals       Our goal A1c is less than 7%. This corresponds with fasting sugars less than 130 and 2 hour after meal sugars less than 180. Please check your blood sugar, keep a log of the results and have this for [place] to review during our next telephone appointment  Our goal bad cholesterol, or LDL, is less than 70 . This is why it is important to continue taking your atorvastatin   Feel free to call me with any questions or concerns. I look forward to our next call!  Wallace Cullens, PharmD, Para [place], CPP Clinical Pharmacist [facility] [phone]         Our next appointment is by telephone on 02/11/2021 at 1:45 pm  Please call the care guide team at [phone] if you need to cancel or reschedule your appointment.    The patient verbalized understanding of instructions, educational materials, and care plan provided today and declined offer to receive copy of patient instructions, educational materials, and care plan.

## 2020-12-31 NOTE — Chronic Care Management (AMB) (Signed)
Chronic Care Management CCM Pharmacy Note  12/31/2020 Name:  [name] MRN:  [id number] DOB:  1965/07/11   Subjective: [name] is an 55 y.o. year old female who is a primary patient of Olin Hauser, DO.  The CCM team was consulted for assistance with disease management and care coordination needs.    Engaged with patient by telephone for follow up visit for pharmacy case management and/or care coordination services.   Objective:  Medications Reviewed Today     Reviewed by Rennis Petty, RPH-CPP (Pharmacist) on 12/31/20 at [place] List Status: <None>   Medication Order Taking? Sig Documenting Provider Last Dose Status Informant  albuterol (VENTOLIN HFA) 108 (90 Base) MCG/ACT inhaler [id number]  INHALE 2 PUFFS INTO THE LUNGS EVERY 4 HOURS AS NEEDED FOR WHEEZING OR SHORTNESS OF BREATH Karamalegos, Devonne Doughty, DO  Active   atorvastatin (LIPITOR) 40 MG tablet [id number]  Take 1 tablet (40 mg total) by mouth at bedtime. Olin Hauser, DO  Active   buPROPion (WELLBUTRIN XL) 150 MG 24 hr tablet [id number] Yes TAKE 1 TABLET(150 MG) BY MOUTH DAILY [place], Devonne Doughty, DO Taking Active   citalopram (CELEXA) 40 MG tablet [id number]  Take 1 tablet (40 mg total) by mouth daily. Olin Hauser, DO  Active   clopidogrel (PLAVIX) 75 MG tablet [id number]  Take 1 tablet (75 mg total) by mouth daily. Olin Hauser, DO  Active   fluconazole (DIFLUCAN) 150 MG tablet [id number]  Take 1 tablet (150 mg total) by mouth daily. For 7 days, may repeat in future if needed. Karamalegos, Devonne Doughty, DO  Active   gabapentin (NEURONTIN) 600 MG tablet [id number]  Take 600 mg by mouth 4 (four) times daily. [provider]  Active   insulin degludec (TRESIBA FLEXTOUCH) 200 UNIT/ML FlexTouch Pen [id number] Yes Inject 14 Units into the skin at bedtime. [provider] Taking Active   linagliptin (TRADJENTA) 5 MG TABS tablet [id number] Yes Take 5 mg  by mouth daily. [provider] Taking Active Pharmacy Records  metFORMIN (GLUCOPHAGE) 1000 MG tablet [id number] Yes Take 1,000 mg by mouth 2 (two) times daily with a meal. [provider] Taking Active   mupirocin ointment (BACTROBAN) 2 % [id number]  Apply 1 application topically 2 (two) times daily as needed. For up to 7-10 days as needed for skin infection Olin Hauser, DO  Active   nystatin (MYCOSTATIN) 100000 UNIT/ML suspension [id number]  Take 5 mLs (500,000 Units total) by mouth 4 (four) times daily. For 7-10 days Andee Poles  Active   NYSTATIN powder [id number]  APPLY TOPICALLY A SMALL AMOUNT TWICE DAILY AS NEEDED [place], Devonne Doughty, DO  Active   oxyCODONE (ROXICODONE) 15 MG immediate release tablet [id number]  Take 15 mg by mouth every 6 (six) hours as needed. [provider]  Active   pantoprazole (PROTONIX) 40 MG tablet [id number] Yes Take 1 tablet (40 mg total) by mouth daily before breakfast. Olin Hauser, DO Taking Active   pioglitazone (ACTOS) 45 MG tablet [id number] Yes Take 45 mg by mouth daily. [provider] Taking Active Pharmacy Records  topiramate (TOPAMAX) 100 MG tablet [id number]  Take 1 tablet (100 mg total) by mouth 2 (two) times daily. Olin Hauser, DO  Active   traZODone (DESYREL) 100 MG tablet [id number] No Start with half tablet for dose 50mg  nightly at bedtime for 2-4 weeks, then increase to 1 whole tab (100mg ) dose nightly  Patient not  taking: Reported on 12/31/2020   Olin Hauser, DO Not Taking Active   TRELEGY ELLIPTA 100-62.5-25 MCG/INH AEPB [id number] Yes Inhale 1 puff into the lungs daily. Olin Hauser, DO Taking Active   Vitamin D, Ergocalciferol, (DRISDOL) 50000 units CAPS capsule [id number]  Take 50,000 Units by mouth every 7 (seven) days. On Wednesday [provider]  Active   zolpidem (AMBIEN) 5 MG tablet [id number] No Take 1 tablet (5 mg total)  by mouth at bedtime as needed for sleep.  Patient not taking: Reported on 12/31/2020   Olin Hauser, DO Not Taking Active             Pertinent Labs:  Lab Results  Component Value Date   HGBA1C 9.2 (H) 08/09/2017   Lab Results  Component Value Date   CHOL 136 04/07/2017   HDL 31 (L) 04/07/2017   LDLCALC 67 04/07/2017   TRIG 191 (H) 04/07/2017   CHOLHDL 4.4 04/07/2017   Lab Results  Component Value Date   CREATININE 0.71 09/23/2017   BUN 11 09/23/2017   NA 133 (L) 09/23/2017   K 4.0 09/23/2017   CL 98 09/23/2017   CO2 27 09/23/2017    SDOH:  (Social Determinants of Health) assessments and interventions performed:    [place]  Review of patient past medical history, allergies, medications, health status, including review of consultants reports, laboratory and other test data, was performed as part of comprehensive evaluation and provision of chronic care management services.   Care Plan : PharmD - Medication Adherence/T2DM Management  Updates made by Rennis Petty, RPH-CPP since 12/31/2020 12:00 AM     Problem: Disease Progression      Long-Range Goal: Disease Progression Prevented or Minimized   Start Date: 08/22/2020  Expected End Date: 11/20/2020  This Visit's Progress: On track  Recent Progress: On track  Priority: High  Note:   Current Barriers:  Unable to achieve control of A1C  Lack of blood sugar results for clinical team  Pharmacist Clinical Goal(s):  Over the next 90 days, patient will achieve adherence to monitoring guidelines and medication adherence to achieve therapeutic efficacy through collaboration with PharmD and provider.    Interventions: 1:1 collaboration with Olin Hauser, DO regarding development and update of comprehensive plan of care as evidenced by provider attestation and co-signature Inter-disciplinary care team collaboration (see longitudinal plan of care) Perform chart review.  Patient seen  for Office Visit with [place] on 12/14 Office Visit with PCP on 11/16 for HLD and insomnia. Provider advised patient to: Start with Trazodone HALF pill = 50mg  nightly for 2-4 weeks, after week 3-4 you can increase to 1 whole pill 100mg  if you decide Also take the Ambien 5mg  nightly AS NEEDED ONLY. Prefer not every night. But can take a few nights in a row to help improve the slep cycle. Do skip some nights in between. We can adjust this in the future Today reports tried both trazodone and zolpidem, but no longer taking either as did not notice a benefit Reports recent difficulty with transportation since car no longer working. Interested in referral to care guide for help with understanding transportation options/benefit  Type 2 Diabetes: Uncontrolled; current treatment: Metformin 1000 mg twice daily Tradjenta 5 mg daily Tresiba 14 units into skin daily at bedtime Pioglitazone 45 mg daily Recalls recent morning blood sugars ranging: 180-228 Denies symptoms of hypoglycemia Have discussed impact of diet and exercise on blood sugar control Have encouraged  patient to work on limiting carbohydrate portion sizes and to work on having regular well-balanced meals throughout the day Today reports has had significantly limited appetite/felt sick on her stomach for ~2 months.  Reports originally thought she was having a stomach virus, but symptoms continued Reports ~1 month ago had vomiting for 2 days. Went to [facility] on 11/23/2020, but left without being seen. Denies vomiting since, but still stays nauseated.  Denies other symptoms Does not have a scale at home, so not sure if has lost weight Denies any changes in medications at time that symptoms started Reports planning to follow up with PCP for evaluation. Encourage patient to call office today Current exercise: limited by right side deficit due to stroke and previous injury to right ankle from a fall Have  encouraged patient to follow up with ankle surgeon to request further physical therapy for ankle Reports planning to call [facility] Endocrinology in 1st week of January to schedule follow up appointment  Medication Adherence: Reports medication adherence improved since started using weekly pillbox   Hyperlipidemia: Current treatment: atorvastatin 40 mg daily  Chronic Obstructive Pulmonary Disease: Current treatment: Trelegy - 1 puff daily Albuterol as needed Confirms using Trelegy daily consistently and rinsing out mouth after each use   Patient Goals/Self-Care Activities Over the next 90 days, patient will:  - focus on medication adherence by using weekly pillbox - check glucose, document, and provide at future appointments - attend medical appointment as scheduled  Follow Up Plan: Telephone follow up appointment with care management team member scheduled for: 02/11/2021 at 1:45 pm      Wallace Cullens, PharmD, BCACP, CPP Clinical Pharmacist [facility] [phone]

## 2020-12-31 NOTE — Telephone Encounter (Signed)
Per agent: "Patient met with the pharmacist today and was advised to follow up with PCP first available to discuss her feeling nausea and no appetite for several months. Patient prefers afternoon appointment due to her sleep pattern, patient scheduled for 01/18/2021 and placed on the wait list. Seeking clinical advice prior to appointment."    Chief Complaint: Nausea, no appetite Symptoms: Nausea, vomiting at times, last episode 11/23/20. No appetite, unintentional weight loss, occasional constipation..  Frequency: Onset 2 months ago Pertinent Negatives: Patient denies fever, no abdominal pain, no diarrhea. Disposition: _0 ED /_1 Urgent Care (no appt availability in office) / _2 Appointment(In office/virtual)/ _3  [place] [facility]/ _4 Home Care/ _5 Refused Recommended Disposition  Additional Notes: Agent secured appt prior to triage. 01/19/20,on wait list. Care advise given per protocol, verbalizes understanding.  Pt requesting med for nausea be called in as appt not until 01/19/20.  CB# [phone]      Reason for Disposition  Nausea is a chronic symptom (recurrent or ongoing AND present > 4 weeks)  Answer Assessment - Initial Assessment Questions 1. NAUSEA SEVERITY: "How bad is the nausea?" (e.g., mild, moderate, severe; dehydration, weight loss)   - MILD: loss of appetite without change in eating habits   - MODERATE: decreased oral intake without significant weight loss, dehydration, or malnutrition   - SEVERE: inadequate caloric or fluid intake, significant weight loss, symptoms of dehydration     Moderate, no appetite.No weight loss known. "People say I look like i'Ve lost weight."  2. ONSET: "When did the nausea begin?"     2 months ago 3. VOMITING: "Any vomiting?" If Yes, ask: "How many times today?"     At times, some constipation at times 4. RECURRENT SYMPTOM: "Have you had nausea before?" If Yes, ask: "When was the last time?" "What happened that time?"     HAd diverticulitis  flare up years ago 5. CAUSE: "What do you think is causing the nausea?" Unsure  Protocols used: Nausea-A-AH

## 2021-01-01 NOTE — Telephone Encounter (Signed)
[name]. We can discuss on 01/19/20.  Things for her to consider would be medication side effect if any new medications that seem to have caused this issue for nausea.  It can be related to elevated blood sugar. [name] and if blood sugar remains elevated that will certainly contribute to nausea and issues with this. Could cause diabetic gastroparesis and nausea vomiting.  [name]. We can review that at visit  If severe nausea vomiting unable to keep fluid or food down and severe symptoms, needs to consider hospital evaluation more immediately due to risk of dehydration and complications.  Deanna Putnam, DO [facility] 01/01/2021, 5:11 PM

## 2021-01-02 ENCOUNTER — Ambulatory Visit: Payer: Medicare Other | Admitting: Internal Medicine

## 2021-01-03 ENCOUNTER — Telehealth: Payer: Self-pay

## 2021-01-03 NOTE — Telephone Encounter (Signed)
° °  Telephone encounter was:  Unsuccessful.  01/03/2021 Name: [name] MRN: [id number] DOB: May 27, 1965  Unsuccessful outbound call made today to assist with:  Transportation Needs   Outreach Attempt:  1st Attempt  A HIPAA compliant voice message was left requesting a return call.  Instructed patient to call back at [phone] at their earliest convenience.  The home number [phone], system stated was not in service and disconnected call. I called pt's cell number [phone] and spouse's number [phone] and left a voicemail for both numbers instructing pt to return my call regarding transportation.  [place] management  [place], [place]  Main Phone: [phone]   E-mail: [email or web address]  Website: [email or web address]

## 2021-01-08 ENCOUNTER — Telehealth: Payer: Self-pay

## 2021-01-08 NOTE — Telephone Encounter (Signed)
° °  Telephone encounter was:  Unsuccessful.  01/08/2021 Name: [name] MRN: [id number] DOB: [birth year]  Unsuccessful outbound call made today to assist with:  Transportation Needs   Outreach Attempt:  2nd Attempt  A HIPAA compliant voice message was left requesting a return call.  Instructed patient to call back at [phone] at their earliest convenience.  [place] management  [place], [place]  Main Phone: [phone]   E-mail: [email or web address]  Website: [email or web address]

## 2021-01-09 ENCOUNTER — Other Ambulatory Visit: Payer: Self-pay | Admitting: Family Medicine

## 2021-01-09 ENCOUNTER — Ambulatory Visit: Payer: Medicare Other | Admitting: Internal Medicine

## 2021-01-09 DIAGNOSIS — F331 Major depressive disorder, recurrent, moderate: Secondary | ICD-10-CM

## 2021-01-09 DIAGNOSIS — G43711 Chronic migraine without aura, intractable, with status migrainosus: Secondary | ICD-10-CM

## 2021-01-09 NOTE — Telephone Encounter (Signed)
Requested medication (s) are due for refill today: yes  Requested medication (s) are on the active medication list: yes  Last refill:  07/10/20 #180 1 refills  Future visit scheduled: yes 1 week  Notes to clinic:  not delegated per protocol . Last labs 09/22/2013. Do you want to refill Rx?     Requested Prescriptions  Pending Prescriptions Disp Refills   topiramate (TOPAMAX) 100 MG tablet [Pharmacy Med Name: TOPIRAMATE 100MG  TABLETS] 180 tablet 1    Sig: TAKE 1 TABLET(100 MG) BY MOUTH TWICE DAILY     Not Delegated - Neurology: Anticonvulsants - topiramate & zonisamide Failed - 01/09/2021  3:14 AM      Failed - This refill cannot be delegated      Failed - Cr in normal range and within 360 days    Creatinine  Date Value Ref Range Status  09/22/2013 1.05 0.60 - 1.30 mg/dL Final   Creatinine, Ser  Date Value Ref Range Status  09/23/2017 0.71 0.40 - 1.20 mg/dL Final          Failed - CO2 in normal range and within 360 days    CO2  Date Value Ref Range Status  09/23/2017 27 19 - 32 mEq/L Final   Co2  Date Value Ref Range Status  09/22/2013 27 21 - 32 mmol/L Final          Passed - Valid encounter within last 12 months    Recent Outpatient Visits           1 month ago Psychophysiological insomnia   [place], DO   2 months ago Diabetic ulcer of toe of right foot associated with type 2 diabetes mellitus, limited to breakdown of skin [facility])   [place], DO   3 months ago Diabetic ulcer of toe of right foot associated with type 2 diabetes mellitus, limited to breakdown of skin [facility])   [place], DO   5 months ago Moderate episode of recurrent major depressive disorder [facility])   [facility] Olin Hauser, DO   5 months ago Centrilobular emphysema ([place])   [facility] [place], Devonne Doughty, DO        Future Appointments             In 1 week [place], Devonne Doughty, DO [facility]            Signed Prescriptions Disp Refills   citalopram (CELEXA) 40 MG tablet 90 tablet 0    Sig: TAKE 1 TABLET(40 MG) BY MOUTH DAILY     Psychiatry:  Antidepressants - SSRI Passed - 01/09/2021  3:14 AM      Passed - Completed PHQ-2 or PHQ-9 in the last 360 days      Passed - Valid encounter within last 6 months    Recent Outpatient Visits           1 month ago Psychophysiological insomnia   [place], DO   2 months ago Diabetic ulcer of toe of right foot associated with type 2 diabetes mellitus, limited to breakdown of skin [facility])   [place], DO   3 months ago Diabetic ulcer of toe of right foot associated with type 2 diabetes mellitus, limited to breakdown of skin [facility])   [facility] [place], Devonne Doughty, DO  5 months ago Moderate episode of recurrent major depressive disorder [facility])   [place], DO   5 months ago Centrilobular emphysema [facility])   [facility] Parks [place], Devonne Doughty, DO       Future Appointments             In 1 week [place], Devonne Doughty, DO [facility], [facility]

## 2021-01-09 NOTE — Telephone Encounter (Signed)
Requested Prescriptions  Pending Prescriptions Disp Refills   topiramate (TOPAMAX) 100 MG tablet [Pharmacy Med Name: TOPIRAMATE 100MG  TABLETS] 180 tablet 1    Sig: TAKE 1 TABLET(100 MG) BY MOUTH TWICE DAILY     Not Delegated - Neurology: Anticonvulsants - topiramate & zonisamide Failed - 01/09/2021  3:14 AM      Failed - This refill cannot be delegated      Failed - Cr in normal range and within 360 days    Creatinine  Date Value Ref Range Status  09/22/2013 1.05 0.60 - 1.30 mg/dL Final   Creatinine, Ser  Date Value Ref Range Status  09/23/2017 0.71 0.40 - 1.20 mg/dL Final         Failed - CO2 in normal range and within 360 days    CO2  Date Value Ref Range Status  09/23/2017 27 19 - 32 mEq/L Final   Co2  Date Value Ref Range Status  09/22/2013 27 21 - 32 mmol/L Final         Passed - Valid encounter within last 12 months    Recent Outpatient Visits          1 month ago Psychophysiological insomnia   [place], DO   2 months ago Diabetic ulcer of toe of right foot associated with type 2 diabetes mellitus, limited to breakdown of skin [facility])   [place], DO   3 months ago Diabetic ulcer of toe of right foot associated with type 2 diabetes mellitus, limited to breakdown of skin [facility])   [place], DO   5 months ago Moderate episode of recurrent major depressive disorder [facility])   [facility] Olin Hauser, DO   5 months ago Centrilobular emphysema ([place])   [facility] [place], Devonne Doughty, DO      Future Appointments            In 1 week [place], Devonne Doughty, DO [facility], [place]            citalopram (CELEXA) 40 MG tablet [Pharmacy Med Name: CITALOPRAM 40MG  TABLETS] 90 tablet 0    Sig: TAKE 1 TABLET(40 MG) BY MOUTH DAILY     Psychiatry:  Antidepressants - SSRI Passed -  01/09/2021  3:14 AM      Passed - Completed PHQ-2 or PHQ-9 in the last 360 days      Passed - Valid encounter within last 6 months    Recent Outpatient Visits          1 month ago Psychophysiological insomnia   [place], DO   2 months ago Diabetic ulcer of toe of right foot associated with type 2 diabetes mellitus, limited to breakdown of skin [facility])   [facility], Devonne Doughty, DO   3 months ago Diabetic ulcer of toe of right foot associated with type 2 diabetes mellitus, limited to breakdown of skin [facility])   [place], DO   5 months ago Moderate episode of recurrent major depressive disorder [facility])   [facility] Olin Hauser, DO   5 months ago Centrilobular emphysema [facility])   [facility] Olin Hauser, DO      Future Appointments            In 1 week [place] Devonne Doughty, DO  [facility], [facility]

## 2021-01-10 ENCOUNTER — Telehealth: Payer: Self-pay

## 2021-01-10 NOTE — Telephone Encounter (Signed)
Copied from [place] [phone]. Topic: General - Other >> Jan 09, 2021  1:18 PM P[place]lus, Brayton Layman A wrote: Reason for CRM: Pt stated she spoke with [facility] transportation who informed her that the office needs to arrange a ride to her appt for her, please advise. >> Jan 10, 2021  8:41 AM Ihor Gully, CMA wrote: Patient can call [facility]) at [phone] to arrange a ride to this appointment. Can you please call this patient with this information.

## 2021-01-10 NOTE — Telephone Encounter (Signed)
I called and left a detail message on the patient personal vm that we do not arrange transportation. I also left [facility] information on the patient vm for her to call and schedule an appt.

## 2021-01-11 ENCOUNTER — Telehealth: Payer: Self-pay

## 2021-01-11 NOTE — Telephone Encounter (Signed)
° °  Telephone encounter was:  Successful.  01/11/2021 Name: [name] MRN: [id number] DOB: Dec 23, 1965  [name] is a 55 y.o. year old female who is a primary care patient of Olin Hauser, DO . The community resource team was consulted for assistance with Transportation Needs   Care guide performed the following interventions:  Patient called in advising she has not heard from transportation at this time. A follow up e-mail was sent out for a status update.  Follow Up Plan:   Waiting on confirmation  [place], [place]  Main Phone: [phone]   E-mail: [email or web address]  Website: [email or web address]

## 2021-01-11 NOTE — Telephone Encounter (Signed)
   Telephone encounter was:  Successful.  01/11/2021 Name: [name] MRN: [id number] DOB: Sep 11, 1965  [name] is a 55 y.o. year old female who is a primary care patient of Olin Hauser, DO . The community resource team was consulted for assistance with Transportation Needs   Care guide performed the following interventions:  Transportation contacted me back advising ride was scheduled for pt and that they are in the process of making their calls. Pt has been advised. Pt is also aware that she can start using [employer] transportation on 01/13/21 per our 3-way call with [facility]. Pt will call [employer] on 01/14/21 or 01/15/21 to book her ride for 1/6 appt.  Follow Up Plan:  No further follow up planned at this time. The patient has been provided with needed resources. Patient is aware of processes and knows to call me if she has any issues contacting transportation.  [place] management  [place], [place]  Main Phone: [phone]  E-mail: [email or web address]  Website: [email or web address]

## 2021-01-15 DIAGNOSIS — I69351 Hemiplegia and hemiparesis following cerebral infarction affecting right dominant side: Secondary | ICD-10-CM | POA: Diagnosis not present

## 2021-01-15 DIAGNOSIS — Z029 Encounter for administrative examinations, unspecified: Secondary | ICD-10-CM | POA: Diagnosis not present

## 2021-01-15 DIAGNOSIS — M961 Postlaminectomy syndrome, not elsewhere classified: Secondary | ICD-10-CM | POA: Diagnosis not present

## 2021-01-15 DIAGNOSIS — Z87891 Personal history of nicotine dependence: Secondary | ICD-10-CM | POA: Diagnosis not present

## 2021-01-15 DIAGNOSIS — Z79891 Long term (current) use of opiate analgesic: Secondary | ICD-10-CM | POA: Diagnosis not present

## 2021-01-15 DIAGNOSIS — Z720 Tobacco use: Secondary | ICD-10-CM | POA: Diagnosis not present

## 2021-01-16 ENCOUNTER — Encounter: Payer: Medicare Other | Attending: Internal Medicine | Admitting: Internal Medicine

## 2021-01-16 ENCOUNTER — Other Ambulatory Visit: Payer: Self-pay

## 2021-01-16 DIAGNOSIS — J449 Chronic obstructive pulmonary disease, unspecified: Secondary | ICD-10-CM | POA: Insufficient documentation

## 2021-01-16 DIAGNOSIS — Z8631 Personal history of diabetic foot ulcer: Secondary | ICD-10-CM | POA: Insufficient documentation

## 2021-01-16 DIAGNOSIS — L97511 Non-pressure chronic ulcer of other part of right foot limited to breakdown of skin: Secondary | ICD-10-CM | POA: Diagnosis not present

## 2021-01-16 DIAGNOSIS — E11621 Type 2 diabetes mellitus with foot ulcer: Secondary | ICD-10-CM | POA: Diagnosis not present

## 2021-01-16 DIAGNOSIS — Z09 Encounter for follow-up examination after completed treatment for conditions other than malignant neoplasm: Secondary | ICD-10-CM | POA: Diagnosis present

## 2021-01-16 NOTE — Progress Notes (Addendum)
MYCAH, MCDOUGALL ([id number]) Visit Report for 01/16/2021 Arrival Information Details Patient Name: [name], [name] Date of Service: 01/16/2021 3:00 PM Medical Record Number: [id number] Patient Account Number: [id number] Date of Birth/Sex: 11-26-1965 (56 y.o. F) Treating RN: [name] Primary [name] [name]: [name] Other Clinician: Referring [name]: [name] Treating [name]/Extender: [name] in Treatment: 5 Visit Information History Since Last Visit Added or deleted any medications: No Patient Arrived: Wheel Chair Any new allergies or adverse reactions: No Arrival Time: 15:30 Had a fall or experienced change in No Accompanied By: self activities of daily living that may affect Transfer Assistance: EasyPivot Patient Lift risk of falls: Patient Identification Verified: Yes Hospitalized since last visit: No Secondary Verification Process Completed: Yes Has Dressing in Place as Prescribed: Yes Patient Requires Transmission-Based No Has Footwear/Offloading in Place as Prescribed: Yes Precautions: Right: Surgical Shoe with Pressure Relief Patient Has Alerts: Yes Insole Patient Alerts: Patient on Blood Thinner Pain Present Now: Yes Plavix Type II Diabetic 12/12/20 Patient [place] >220 Right Leg Electronic Signature(s) Signed: 01/16/2021 4:31:39 PM By: [name] Entered By: [name] on 01/16/2021 15:37:36 [name], [name] ([id number]) -------------------------------------------------------------------------------- Clinic Level of [name] Assessment Details Patient Name: [name], [name] Date of Service: 01/16/2021 3:00 PM Medical Record Number: [id number] Patient Account Number: [id number] Date of Birth/Sex: 07-02-1965 (56 y.o. F) Treating RN: [name] Primary [name] [name]: [name] Other Clinician: Referring [name]: [name] Treating Khadeejah Castner/Extender: [name] in Treatment:  5 Clinic Level of [name] Assessment Items TOOL 4 Quantity Score X - Use when only an EandM is performed on FOLLOW-UP visit 1 0 ASSESSMENTS - Nursing Assessment / Reassessment []  - Reassessment of Co-morbidities (includes updates in patient status) 0 []  - 0 Reassessment of Adherence to Treatment Plan ASSESSMENTS - Wound and Skin Assessment / Reassessment X - Simple Wound Assessment / Reassessment - one wound 1 5 []  - 0 Complex Wound Assessment / Reassessment - multiple wounds []  - 0 Dermatologic / Skin Assessment (not related to wound area) ASSESSMENTS - Focused Assessment []  - Circumferential Edema Measurements - multi extremities 0 []  - 0 Nutritional Assessment / Counseling / Intervention []  - 0 Lower Extremity Assessment (monofilament, tuning fork, pulses) []  - 0 Peripheral Arterial Disease Assessment (using hand held doppler) ASSESSMENTS - Ostomy and/or Continence Assessment and [name] []  - Incontinence Assessment and Management 0 []  - 0 Ostomy [name] Assessment and Management (repouching, etc.) PROCESS - Coordination of [name] X - Simple Patient / Family Education for ongoing [name] 1 15 []  - 0 Complex (extensive) Patient / Family Education for ongoing [name] []  - 0 Staff obtains Programmer, systems, Records, Test Results / Process Orders []  - 0 Staff telephones HHA, Nursing Homes / Clarify orders / etc []  - 0 Routine Transfer to another Facility (non-emergent condition) []  - 0 Routine Hospital Admission (non-emergent condition) []  - 0 New Admissions / Biomedical engineer / Ordering NPWT, Apligraf, etc. []  - 0 Emergency Hospital Admission (emergent condition) X- 1 10 Simple Discharge Coordination []  - 0 Complex (extensive) Discharge Coordination PROCESS - Special Needs []  - Pediatric / Minor Patient Management 0 []  - 0 Isolation Patient Management []  - 0 Hearing / Language / Visual special needs []  - 0 Assessment of Community assistance (transportation, D/C planning, etc.) []   - 0 Additional assistance / Altered mentation []  - 0 Support Surface(s) Assessment (bed, cushion, seat, etc.) INTERVENTIONS - Wound Cleansing / Measurement [name], [name] ([id number]) []  - 0 Simple Wound Cleansing - one wound []  - 0  Complex Wound Cleansing - multiple wounds []  - 0 Wound Imaging (photographs - any number of wounds) []  - 0 Wound Tracing (instead of photographs) []  - 0 Simple Wound Measurement - one wound []  - 0 Complex Wound Measurement - multiple wounds INTERVENTIONS - Wound Dressings []  - Small Wound Dressing one or multiple wounds 0 []  - 0 Medium Wound Dressing one or multiple wounds []  - 0 Large Wound Dressing one or multiple wounds []  - 0 Application of Medications - topical []  - 0 Application of Medications - injection INTERVENTIONS - Miscellaneous []  - External ear exam 0 []  - 0 Specimen Collection (cultures, biopsies, blood, body fluids, etc.) []  - 0 Specimen(s) / Culture(s) sent or taken to Lab for analysis []  - 0 Patient Transfer (multiple staff / Civil Service fast streamer / Similar devices) []  - 0 Simple Staple / Suture removal (25 or less) []  - 0 Complex Staple / Suture removal (26 or more) []  - 0 Hypo / Hyperglycemic Management (close monitor of Blood Glucose) []  - 0 Ankle / Brachial Index (ABI) - do not check if billed separately X- 1 5 Vital Signs Has the patient been seen at the hospital within the last three years: Yes Total Score: 35 Level Of [name]: New/Established - Level 1 Electronic Signature(s) Signed: 01/21/2021 9:27:28 AM By: [name] Entered By: [name] on 01/16/2021 16:33:33 [name], [name] ([id number]) -------------------------------------------------------------------------------- Encounter Discharge Information Details Patient Name: [name], [name] Date of Service: 01/16/2021 3:00 PM Medical Record Number: [id number] Patient Account Number: [id number] Date of Birth/Sex: 21-Apr-1965 (56 y.o. F) Treating RN: [name] Primary [name] Devlon Dosher: [name] Other Clinician: Referring Aronda Burford: [name] Treating Taji Sather/Extender: [name] in Treatment: 5 Encounter Discharge Information Items Discharge Condition: Stable Ambulatory Status: Wheelchair Discharge Destination: Home Transportation: Private Auto Accompanied By: self/son Schedule Follow-up Appointment: No Clinical Summary of [name]: Notes pt to call PRN, wound healed Electronic Signature(s) Signed: 01/16/2021 4:35:10 PM By: [name] Entered By: [name] on 01/16/2021 16:35:10 [name], [name] ([id number]) -------------------------------------------------------------------------------- Lower Extremity Assessment Details Patient Name: Eisinger, Mylisa A. Date of Service: 01/16/2021 3:00 PM Medical Record Number: [id number] Patient Account Number: [id number] Date of Birth/Sex: Jul 21, 1965 (56 y.o. F) Treating RN: [name] Primary [name] Marisal Swarey: [name] Other Clinician: Referring Sahana Boyland: [name] Treating Stan Cantave/Extender: [name] in Treatment: 5 Vascular Assessment Pulses: Dorsalis Pedis Palpable: [Right:Yes] Posterior Tibial Palpable: [Right:Yes] Electronic Signature(s) Signed: 01/16/2021 4:31:39 PM By: [name] Entered By: [name] on 01/16/2021 15:46:53 [name], [name] ([id number]) -------------------------------------------------------------------------------- Multi Wound Chart Details Patient Name: Nelligan, Kerrilynn A. Date of Service: 01/16/2021 3:00 PM Medical Record Number: [id number] Patient Account Number: [id number] Date of Birth/Sex: [birth year] (56 y.o. 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 Comorbid History: Chronic Obstructive Pulmonary N/A N/A Disease (COPD), Type II Diabetes, Osteoarthritis, Neuropathy Date Acquired: 10/22/2020 N/A N/A Weeks of Treatment: 5 N/A N/A Wound Status: Healed - Epithelialized N/A N/A Measurements L x W x D (cm) 0x0x0 N/A N/A Area (cm) : 0 N/A N/A Volume (cm) : 0 N/A N/A % Reduction in Area: 100.00% N/A N/A % Reduction in Volume: 100.00% N/A N/A Classification:  Grade 0 N/A N/A Exudate Amount: None Present N/A N/A Granulation Amount: None Present (0%) N/A N/A Necrotic Amount: None Present (0%) N/A N/A Epithelialization: Large (67-100%) N/A N/A Assessment Notes: no open areas noted upon N/A N/A assessment Treatment Notes Electronic Signature(s) Signed: 01/16/2021 4:31:39 PM By: [name] Entered By: [name] on 01/16/2021 16:08:53 [name], [name] ([id number]) -------------------------------------------------------------------------------- [place] Details Patient Name: [name], [name] Date of Service: 01/16/2021 3:00 PM Medical Record Number: [id number] Patient Account Number: [id number] Date of Birth/Sex: 10/31/1965 (56 y.o. F) Treating RN: [name] Primary [name] Ellison Leisure: [name] Other Clinician: Referring Seleen Walter: [name] Treating Mariaceleste Herrera/Extender: [name] in Treatment: 5 Active Inactive Electronic Signature(s) Signed: 01/16/2021 4:31:39 PM By: [name] Entered By: [name] on 01/16/2021 16:08:39 [name], [name] ([id number]) -------------------------------------------------------------------------------- Pain Assessment Details Patient Name: [name],  [name] Date of Service: 01/16/2021 3:00 PM Medical Record Number: [id number] Patient Account Number: [id number] Date of Birth/Sex: 1965/12/31 (56 y.o. F) Treating RN: [name] Primary [name] Concha Sudol: [name] Other Clinician: Referring Charese Abundis: [name] Treating Jorah Hua/Extender: [name] in Treatment: 5 Active Problems Location of Pain Severity and Description of Pain Patient Has Paino Yes Site Locations Rate the pain. Current Pain Level: 4 Pain Management and Medication Current Pain Management: Notes patient states 4/10 pain in right foot and 6/10 generalized pain Electronic Signature(s) Signed: 01/16/2021 4:31:39 PM By: [name] Entered By: [name] on 01/16/2021 15:40:58 [name], [name] ([id number]) -------------------------------------------------------------------------------- Patient/Caregiver Education Details Patient Name: [name], [name] Date of Service: 01/16/2021 3:00 PM Medical Record Number: [id number] Patient Account Number: [id number] Date of Birth/Gender: [birth year] (57 y.o. F) Treating RN: [name] Primary [name] Physician: [name] Other Clinician: Referring Physician: Nobie [name] Treating Physician/Extender: [name] in Treatment: 5 Education Assessment Education Provided To: Patient Education Topics Provided Wound/Skin Impairment: Handouts: Caring for Your Ulcer Methods: Explain/Verbal Responses: State content correctly Electronic Signature(s) Signed: 01/21/2021 9:27:28 AM By: [name] Entered By: [name] on 01/16/2021 16:34:07 [name], [name] ([id number]) -------------------------------------------------------------------------------- Wound Assessment Details Patient Name: [name], [name] Date of Service: 01/16/2021 3:00 PM Medical Record Number: [id number] Patient Account Number: [id number] Date of Birth/Sex: Nov 03, 1965 (56 y.o.  F) Treating RN: [name] Primary [name] Marna Weniger: [name] Other Clinician: Referring Michaeljames Milnes: [name] Treating Deneise Getty/Extender: [name] in Treatment: 5 Wound Status Wound Number: 1 Primary Diabetic Wound/Ulcer of the Lower Extremity Etiology: Wound Location: Right Toe Great Wound Healed - Epithelialized Wounding Event: Gradually Appeared Status: Date Acquired: 10/22/2020 Comorbid Chronic Obstructive Pulmonary Disease (COPD), Type II Weeks Of Treatment: 5 History: Diabetes, Osteoarthritis, Neuropathy Clustered Wound: No Photos Wound Measurements Length: (cm) 0 % R Width: (cm) 0 % R Depth: (cm) 0 Epi Area: (cm) 0 Tu Volume: (cm) 0 Un eduction in Area: 100% eduction in Volume: 100% thelialization: Large (67-100%) nneling: No dermining: No Wound Description Classification: Grade 0 Fou Exudate Amount: None Present Slo l Odor After Cleansing: No ugh/Fibrino No Wound Bed Granulation Amount: None Present (0%) Necrotic Amount: None Present (0%) Assessment Notes no open areas noted upon assessment Treatment Notes Wound #1 (Toe Great) Wound Laterality: Right Cleanser Peri-Wound [name] Topical Primary Dressing Secondary Dressing Kornegay, Aolani A. ([id number]) Secured With Compression Wrap Compression Stockings Add-Ons Electronic Signature(s) Signed: 01/16/2021 4:31:39 PM By: [name] Entered By: [name] on 01/16/2021 16:08:14 Bagdasarian, Makayela A. ([id number]) -------------------------------------------------------------------------------- Vitals Details Patient Name: Shake, Nickisha A. Date of Service: 01/16/2021 3:00 PM Medical Record Number: [id number] Patient Account Number: [id number] Date  of Birth/Sex: October 17, 1965 (56 y.o. F) Treating RN: [name] Primary [name] Pariss Hommes: [name] Other Clinician: Referring Boniface Goffe: [name] Treating Melvena Vink/Extender: [name]  in Treatment: 5 Vital Signs Time Taken: 13:37 Temperature (F): 98.9 Height (in): 66 Pulse (bpm): 96 Weight (lbs): 255 Respiratory Rate (breaths/min): 18 Body Mass Index (BMI): 41.2 Blood Pressure (mmHg): 116/79 Reference Range: 80 - 120 mg / dl Electronic Signature(s) Signed: 01/16/2021 4:31:39 PM By: [name] Entered By: [name] on 01/16/2021 15:40:25

## 2021-01-16 NOTE — Progress Notes (Signed)
KARIAH, LOREDO ([id number]) Visit Report for 01/16/2021 Chief Complaint Document Details Patient Name: [name], [name] Date of Service: 01/16/2021 3:00 PM Medical Record Number: [id number] Patient Account Number: [id number] Date of Birth/Sex: 08/18/1965 (56 y.o. F) Treating RN: Levora Dredge Primary Care Provider: Nobie Putnam Other Clinician: Referring Provider: Nobie Putnam Treating Provider/Extender: Yaakov Guthrie in Treatment: 5 Information Obtained from: Patient Chief Complaint Right foot wound - 1st toe Electronic Signature(s) Signed: 01/16/2021 4:22:15 PM By: Kalman Shan DO Entered By: Kalman Shan on 01/16/2021 16:18:08 [place], [place]. ([id number]) -------------------------------------------------------------------------------- HPI Details Patient Name: Earlywine, Ceniya A. Date of Service: 01/16/2021 3:00 PM Medical Record Number: [id number] Patient Account Number: [id number] Date of Birth/Sex: Feb 07, 1965 (56 y.o. F) Treating RN: Levora Dredge Primary Care Provider: Nobie Putnam Other Clinician: Referring Provider: Nobie Putnam Treating Provider/Extender: Yaakov Guthrie in Treatment: 5 History of Present Illness HPI Description: Admission 12/12/2020 [name] is a 56 year old female with a past medical history of uncontrolled insulin-dependent type 2 diabetes with most recent hemoglobin A1c of 12 and COPD that presents to the clinic for a right great toe wound. She has had issues with her right great toe since August 2022. She has been evaluated by her primary care physician for this issue and been treated for wound infection with clindamycin and Levaquin and topical mupirocin on 09/21/2020. She had a right foot x-ray due to a sprained ankle injury on 10/10 that showed no bony abnormality. She presents today because she states that her right great toe Has become red and swollen over the past week. She has had  drainage from the toe but no open wound. 12/7; patient presents for 1 week follow-up. She is taken 4 days worth of clindamycin at this point. She reports stability in her symptoms. She has been using Neosporin to the wound bed. She is not sure how the silver alginate works so she has not been putting it on. 12/14; patient presents for 1 week follow-up. She has been using gentamicin cream to the wound beds. She finished clindamycin. She has not been able to obtain the right foot x-ray. She currently denies signs of infection. She continues to have chronic pain. 1/4; patient presents for follow-up. She was previously using gentamicin cream to the wound beds. She reports that her wounds are healed. She has no issues or complaints today. Electronic Signature(s) Signed: 01/16/2021 4:22:15 PM By: Kalman Shan DO Entered By: Kalman Shan on 01/16/2021 16:18:44 [name], [name] AMarland Kitchen ([id number]) -------------------------------------------------------------------------------- Physical Exam Details Patient Name: Burciaga, Mariha A. Date of Service: 01/16/2021 3:00 PM Medical Record Number: [id number] Patient Account Number: [id number] Date of Birth/Sex: [birth year] (56 y.o. F) Treating RN: Levora Dredge Primary Care Provider: Nobie Putnam Other Clinician: Referring Provider: Nobie Putnam Treating Provider/Extender: Yaakov Guthrie in Treatment: 5 Constitutional . Cardiovascular . Psychiatric . Notes Right foot: Epithelialization to previous wound sites Electronic Signature(s) Signed: 01/16/2021 4:22:15 PM By: Kalman Shan DO Entered By: Kalman Shan on 01/16/2021 16:19:03 [name], [name] ([id number]) -------------------------------------------------------------------------------- Physician Orders Details Patient Name: [name], [name] Date of Service: 01/16/2021 3:00 PM Medical Record Number: [id number] Patient Account Number: [id number] Date of Birth/Sex:  1965/10/15 (56 y.o. F) Treating RN: Levora Dredge Primary Care Provider: Nobie Putnam Other Clinician: Referring Provider: Nobie Putnam Treating Provider/Extender: Yaakov Guthrie in Treatment: 5 Verbal / Phone Orders: No Diagnosis Coding ICD-10 Coding Code Description L97.511 Non-pressure chronic ulcer of other part of right foot limited to breakdown of skin E11.621 Type 2 diabetes mellitus with foot  ulcer J44.9 Chronic obstructive pulmonary disease, unspecified Electronic Signature(s) Signed: 01/16/2021 4:22:15 PM By: Kalman Shan DO Entered By: Kalman Shan on 01/16/2021 16:21:55 [name], [name] ([id number]) -------------------------------------------------------------------------------- Problem List Details Patient Name: [name], [name] Date of Service: 01/16/2021 3:00 PM Medical Record Number: [id number] Patient Account Number: [id number] Date of Birth/Sex: 1965/04/04 (56 y.o. F) Treating RN: Levora Dredge Primary Care Provider: Nobie Putnam Other Clinician: Referring Provider: Nobie Putnam Treating Provider/Extender: Yaakov Guthrie in Treatment: 5 Active Problems ICD-10 Encounter Code Description Active Date MDM Diagnosis L97.511 Non-pressure chronic ulcer of other part of right foot limited to 12/12/2020 No Yes breakdown of skin E11.621 Type 2 diabetes mellitus with foot ulcer 12/12/2020 No Yes J44.9 Chronic obstructive pulmonary disease, unspecified 12/12/2020 No Yes Inactive Problems Resolved Problems ICD-10 Code Description Active Date Resolved Date L03.115 Cellulitis of right lower limb 12/12/2020 12/12/2020 Electronic Signature(s) Signed: 01/16/2021 4:22:15 PM By: Kalman Shan DO Entered By: Kalman Shan on 01/16/2021 16:18:01 [name], [place]. ([id number]) -------------------------------------------------------------------------------- Progress Note Details Patient Name: [name], [name] Date  of Service: 01/16/2021 3:00 PM Medical Record Number: [id number] Patient Account Number: [id number] Date of Birth/Sex: [birth year] (56 y.o. F) Treating RN: Levora Dredge Primary Care Provider: Nobie Putnam Other Clinician: Referring Provider: Nobie Putnam Treating Provider/Extender: Yaakov Guthrie in Treatment: 5 Subjective Chief Complaint Information obtained from Patient Right foot wound - 1st toe History of Present Illness (HPI) Admission 12/12/2020 Ms. Taviana Westergren is a 56 year old female with a past medical history of uncontrolled insulin-dependent type 2 diabetes with most recent hemoglobin A1c of 12 and COPD that presents to the clinic for a right great toe wound. She has had issues with her right great toe since August 2022. She has been evaluated by her primary care physician for this issue and been treated for wound infection with clindamycin and Levaquin and topical mupirocin on 09/21/2020. She had a right foot x-ray due to a sprained ankle injury on 10/10 that showed no bony abnormality. She presents today because she states that her right great toe Has become red and swollen over the past week. She has had drainage from the toe but no open wound. 12/7; patient presents for 1 week follow-up. She is taken 4 days worth of clindamycin at this point. She reports stability in her symptoms. She has been using Neosporin to the wound bed. She is not sure how the silver alginate works so she has not been putting it on. 12/14; patient presents for 1 week follow-up. She has been using gentamicin cream to the wound beds. She finished clindamycin. She has not been able to obtain the right foot x-ray. She currently denies signs of infection. She continues to have chronic pain. 1/4; patient presents for follow-up. She was previously using gentamicin cream to the wound beds. She reports that her wounds are healed. She has no issues or complaints  today. Objective Constitutional Vitals Time Taken: 1:37 PM, Height: 66 in, Weight: 255 lbs, BMI: 41.2, Temperature: 98.9 F, Pulse: 96 bpm, Respiratory Rate: 18 breaths/min, Blood Pressure: 116/79 mmHg. General Notes: Right foot: Epithelialization to previous wound sites Integumentary (Hair, Skin) Wound #1 status is Healed - Epithelialized. Original cause of wound was Gradually Appeared. The date acquired was: 10/22/2020. The wound has been in treatment 5 weeks. The wound is located on the Right Toe Great. The wound measures 0cm length x 0cm width x 0cm depth; 0cm^2 area and 0cm^3 volume. There is no tunneling or undermining noted. There is a none present amount of  drainage noted. There is no granulation within the wound bed. There is no necrotic tissue within the wound bed. General Notes: no open areas noted upon assessment Assessment Active Problems ICD-10 Non-pressure chronic ulcer of other part of right foot limited to breakdown of skin Type 2 diabetes mellitus with foot ulcer Chronic obstructive pulmonary disease, unspecified [name], [name] ([id number]) Patient did well with gentamicin cream. She has no open wounds. She may follow-up as needed. Plan 1. Discharge from wound center due to closed wound 2. Follow-up as needed Electronic Signature(s) Signed: 01/16/2021 4:22:15 PM By: Kalman Shan DO Entered By: Kalman Shan on 01/16/2021 16:19:35 [name], [name] ([id number]) -------------------------------------------------------------------------------- SuperBill Details Patient Name: [name], [name] Date of Service: 01/16/2021 Medical Record Number: [id number] Patient Account Number: [id number] Date of Birth/Sex: September 07, 1965 (56 y.o. F) Treating RN: Levora Dredge Primary Care Provider: Nobie Putnam Other Clinician: Referring Provider: Nobie Putnam Treating Provider/Extender: Yaakov Guthrie in Treatment: 5 Diagnosis Coding ICD-10 Codes Code  Description [phone] Non-pressure chronic ulcer of other part of right foot limited to breakdown of skin E11.621 Type 2 diabetes mellitus with foot ulcer J44.9 Chronic obstructive pulmonary disease, unspecified Facility Procedures CPT4 Code: [id number] Description: [phone] - WOUND CARE VISIT-LEV 1 EST PT Modifier: Quantity: 1 Physician Procedures CPT4 Code: [id number] Description: 99213 - WC PHYS LEVEL 3 - EST PT Modifier: Quantity: 1 CPT4 Code: Description: ICD-10 Diagnosis Description L97.511 Non-pressure chronic ulcer of other part of right foot limited to break E11.621 Type 2 diabetes mellitus with foot ulcer J44.9 Chronic obstructive pulmonary disease, unspecified Modifier: down of skin Quantity: Electronic Signature(s) Signed: 01/16/2021 4:33:46 PM By: Levora Dredge Previous Signature: 01/16/2021 4:22:15 PM Version By: Kalman Shan DO Entered By: Levora Dredge on 01/16/2021 16:33:46

## 2021-01-18 ENCOUNTER — Encounter: Payer: Self-pay | Admitting: Family Medicine

## 2021-01-18 ENCOUNTER — Telehealth: Payer: Self-pay

## 2021-01-18 ENCOUNTER — Other Ambulatory Visit: Payer: Self-pay

## 2021-01-18 ENCOUNTER — Ambulatory Visit (INDEPENDENT_AMBULATORY_CARE_PROVIDER_SITE_OTHER): Payer: Medicare Other | Admitting: Family Medicine

## 2021-01-18 DIAGNOSIS — Z794 Long term (current) use of insulin: Secondary | ICD-10-CM

## 2021-01-18 DIAGNOSIS — E1169 Type 2 diabetes mellitus with other specified complication: Secondary | ICD-10-CM | POA: Diagnosis not present

## 2021-01-18 DIAGNOSIS — K3184 Gastroparesis: Secondary | ICD-10-CM

## 2021-01-18 DIAGNOSIS — K420 Umbilical hernia with obstruction, without gangrene: Secondary | ICD-10-CM

## 2021-01-18 DIAGNOSIS — R112 Nausea with vomiting, unspecified: Secondary | ICD-10-CM

## 2021-01-18 DIAGNOSIS — E1143 Type 2 diabetes mellitus with diabetic autonomic (poly)neuropathy: Secondary | ICD-10-CM

## 2021-01-18 MED ORDER — METOCLOPRAMIDE HCL 5 MG PO TABS: 5.0000 mg | ORAL_TABLET | Freq: Three times a day (TID) | ORAL | 2 refills | Status: DC

## 2021-01-18 MED ORDER — ONDANSETRON 4 MG PO TBDP: 4.0000 mg | ORAL_TABLET | Freq: Three times a day (TID) | ORAL | 2 refills | Status: DC | PRN

## 2021-01-18 NOTE — Patient Instructions (Addendum)
Thank you for coming to the office today.  [facility] [address] [place],  [place]  [address] Phone: [phone]  Stay tuned for apt from surgeons  May try either Zofran dissolving tab or can try the Metoclopramide (reglan) for nausea  I think it is likely Diabetic Gastroparesis.  If not improving with sugar control, then we can refer to GI.  Please schedule a Follow-up Appointment to: Return if symptoms worsen or fail to improve.  Gastroparesis Gastroparesis is a condition in which food takes longer than normal to empty from the stomach. This condition is also known as delayed gastric emptying. It is usually a long-term (chronic) condition. There is no cure, but there are treatments and things that you can do at home to help relieve symptoms. Treating the underlying condition that causes gastroparesis can also help relieve symptoms. What are the causes? In many cases, the cause of this condition is not known. Possible causes include: A hormone (endocrine) disorder, such as hypothyroidism or diabetes. A nervous system disease, such as Parkinson's disease or multiple sclerosis. Cancer, infection, or surgery that affects the stomach or vagus nerve. The vagus nerve runs from your chest, through your neck, and to the lower part of your brain. A connective tissue disorder, such as scleroderma. Certain medicines. What increases the risk? You are more likely to develop this condition if: You have certain disorders or diseases. These may include: An endocrine disorder. An eating disorder. Amyloidosis. Scleroderma. Parkinson's disease. Multiple sclerosis. Cancer or infection of the stomach or the vagus nerve. You have had surgery on your stomach or vagus nerve. You take certain medicines. You are female. What are the signs or symptoms? Symptoms of this condition include: Feeling full after eating very little or a loss of appetite. Nausea,  vomiting, or heartburn. Bloating of your abdomen. Inconsistent blood sugar (glucose) levels on blood tests. Unexplained weight loss. Acid from the stomach coming up into the esophagus (gastroesophageal reflux). Sudden tightening (spasm) of the stomach, which can be painful. Symptoms may come and go. Some people may not notice any symptoms. How is this diagnosed? This condition is diagnosed with tests, such as: Tests that check how long it takes food to move through the stomach and intestines. These tests include: Upper gastrointestinal (GI) series. For this test, you drink a liquid that shows up well on X-rays, and then X-rays are taken of your intestines. Gastric emptying scintigraphy. For this test, you eat food that contains a small amount of radioactive material, and then scans are taken. Wireless capsule GI monitoring system. For this test, you swallow a pill (capsule) that records information about how foods and fluid move through your stomach. Gastric manometry. For this test, a tube is passed down your throat and into your stomach to measure electrical and muscular activity. Endoscopy. For this test, a long, thin tube with a camera and light on the end is passed down your throat and into your stomach to check for problems in your stomach lining. Ultrasound. This test uses sound waves to create images of the inside of your body. This can help rule out gallbladder disease or pancreatitis as a cause of your symptoms. How is this treated? There is no cure for this condition, but treatment and home care may relieve symptoms. Treatment may include: Treating the underlying cause. Managing your symptoms by making changes to your diet and exercise habits. Taking medicines to control nausea and vomiting and to stimulate stomach muscles. Getting food  through a feeding tube in the hospital. This may be done in severe cases. Having surgery to insert a device called a gastric electrical stimulator  into your body. This device helps improve stomach emptying and control nausea and vomiting. Follow these instructions at home: Take over-the-counter and prescription medicines only as told by your health care provider. Follow instructions from your health care provider about eating or drinking restrictions. Your health care provider may recommend that you: Eat smaller meals more often. Eat low-fat foods. Eat low-fiber forms of high-fiber foods. For example, eat cooked vegetables instead of raw vegetables. Have only liquid foods instead of solid foods. Liquid foods are easier to digest. Drink enough fluid to keep your urine pale yellow. Exercise as often as told by your health care provider. Keep all follow-up visits. This is important. Contact a health care provider if you: Notice that your symptoms do not improve with treatment. Have new symptoms. Get help right away if you: Have severe pain in your abdomen that does not improve with treatment. Have nausea that is severe or does not go away. Vomit every time you drink fluids. Summary Gastroparesis is a long-term (chronic) condition in which food takes longer than normal to empty from the stomach. Symptoms include nausea, vomiting, heartburn, bloating of your abdomen, and loss of appetite. Eating smaller portions, low-fat foods, and low-fiber forms of high-fiber foods may help you manage your symptoms. Get help right away if you have severe pain in your abdomen. This information is not intended to replace advice given to you by your health care provider. Make sure you discuss any questions you have with your health care provider. Document Revised: 05/09/2019 Document Reviewed: 05/09/2019 Elsevier Patient Education  2022 [employer].   If you have any other questions or concerns, please feel free to call the office or send a message through [facility]. You may also schedule an earlier appointment if necessary.  Additionally, you may be  receiving a survey about your experience at our office within a few days to 1 week by e-mail or mail. We value your feedback.  Nobie Putnam, DO [facility]

## 2021-01-18 NOTE — Telephone Encounter (Signed)
° °  Telephone encounter was:  Successful.  01/18/2021 Name: ZEYA BALLES MRN: [id number] DOB: 31-Aug-1965  Gwenith Spitz is a 56 y.o. year old female who is a primary care patient of Olin Hauser, DO . The community resource team was consulted for assistance with Transportation Needs   Care guide performed the following interventions:  Pt called in advising she has not heard anything from [facility] regarding her ride today. [employer] was contacted and ride was confirmed. Ride ID number is [id number].  Follow Up Plan:  No further follow up planned at this time. The patient has been provided with needed resources.  [facility] management  [place], [place]  Main Phone: [phone]   E-mail: [email or web address]  Website: [email or web address]

## 2021-01-18 NOTE — Progress Notes (Signed)
Virtual Visit via Telephone The purpose of this virtual visit is to provide medical care while limiting exposure to the novel coronavirus (COVID19) for both patient and office staff.  Consent was obtained for phone visit:  Yes.   Answered questions that patient had about telehealth interaction:  Yes.   I discussed the limitations, risks, security and privacy concerns of performing an evaluation and management service by telephone. I also discussed with the patient that there may be a patient responsible charge related to this service. The patient expressed understanding and agreed to proceed.  Patient Location: Home Provider Location: [facility] (Office)  Participants in virtual visit: - Patient: [name] - CMA: Orinda Kenner, [place] - Provider: Dr Parks Ranger  ---------------------------------------------------------------------- Chief Complaint  Patient presents with   Nausea   Diarrhea    S: Reviewed CMA documentation. I have called patient and gathered additional HPI as follows:  Nausea / reduced appetite Uncontrolled Type 2 Diabetes Morbid Obesity Last A1c >12 (04/2020) Reports symptoms onset 1.5 months, some improved. But she has fullness early satiety. Not on nausea medication. No other new triggers at that time. Admits some abnormal bowels and diarrhea as well.  Umbilical vs Ventral Hernia Large increasing, present >5 years, not causing pain but is bothersome to her. She requests it to be evaluated.  Diabetic Ulcer R Toe Recently graduated from [employer]. Resolving  Denies any fevers, chills, sweats, body ache, cough, shortness of breath, sinus pain or pressure, headache, abdominal pain  Past Medical History:  Diagnosis Date   Anxiety    Cancer ([place])    a spot on liver and treated    Complication of anesthesia    restless,easily upset   COPD (chronic obstructive pulmonary disease) ([place])    Depression    Diverticulitis    Fatty  liver    GERD (gastroesophageal reflux disease)    Headache(784.0)    migraines   History of kidney stones    Hyperlipidemia    Hypertension    Pneumonia    Restless    Stroke [facility])    Social History   Tobacco Use   Smoking status: Every Day    Packs/day: 1.50    Years: 20.00    Pack years: 30.00    Types: Cigarettes   Smokeless tobacco: Never  Vaping Use   Vaping Use: Former  Substance Use Topics   Alcohol use: No   Drug use: Yes    Types: Marijuana    Comment: last smoked 2 days ago  8/4    Current Outpatient Medications:    albuterol (VENTOLIN HFA) 108 (90 Base) MCG/ACT inhaler, INHALE 2 PUFFS INTO THE LUNGS EVERY 4 HOURS AS NEEDED FOR WHEEZING OR SHORTNESS OF BREATH, Disp: 6.7 g, Rfl: 5   atorvastatin (LIPITOR) 40 MG tablet, Take 1 tablet (40 mg total) by mouth at bedtime., Disp: 90 tablet, Rfl: 3   buPROPion (WELLBUTRIN XL) 150 MG 24 hr tablet, TAKE 1 TABLET(150 MG) BY MOUTH DAILY, Disp: 90 tablet, Rfl: 0   citalopram (CELEXA) 40 MG tablet, TAKE 1 TABLET(40 MG) BY MOUTH DAILY, Disp: 90 tablet, Rfl: 0   clopidogrel (PLAVIX) 75 MG tablet, Take 1 tablet (75 mg total) by mouth daily., Disp: 90 tablet, Rfl: 3   fluconazole (DIFLUCAN) 150 MG tablet, Take 1 tablet (150 mg total) by mouth daily. For 7 days, may repeat in future if needed., Disp: 7 tablet, Rfl: 1   gabapentin (NEURONTIN) 600 MG tablet, Take 600  mg by mouth 4 (four) times daily., Disp: , Rfl:    insulin degludec (TRESIBA FLEXTOUCH) 200 UNIT/ML FlexTouch Pen, Inject 14 Units into the skin at bedtime., Disp: , Rfl:    linagliptin (TRADJENTA) 5 MG TABS tablet, Take 5 mg by mouth daily., Disp: , Rfl:    metFORMIN (GLUCOPHAGE) 1000 MG tablet, Take 1,000 mg by mouth 2 (two) times daily with a meal., Disp: , Rfl:    metoCLOPramide (REGLAN) 5 MG tablet, Take 1 tablet (5 mg total) by mouth 3 (three) times daily before meals., Disp: 90 tablet, Rfl: 2   mupirocin ointment (BACTROBAN) 2 %, Apply 1 application topically 2  (two) times daily as needed. For up to 7-10 days as needed for skin infection, Disp: 22 g, Rfl: 0   nystatin (MYCOSTATIN) 100000 UNIT/ML suspension, Take 5 mLs (500,000 Units total) by mouth 4 (four) times daily. For 7-10 days, Disp: 60 mL, Rfl: 0   NYSTATIN powder, APPLY TOPICALLY A SMALL AMOUNT TWICE DAILY AS NEEDED, Disp: 60 g, Rfl: 2   ondansetron (ZOFRAN-ODT) 4 MG disintegrating tablet, Take 1 tablet (4 mg total) by mouth every 8 (eight) hours as needed for nausea or vomiting., Disp: 30 tablet, Rfl: 2   oxyCODONE (ROXICODONE) 15 MG immediate release tablet, Take 15 mg by mouth every 6 (six) hours as needed., Disp: , Rfl:    pantoprazole (PROTONIX) 40 MG tablet, Take 1 tablet (40 mg total) by mouth daily before breakfast., Disp: 90 tablet, Rfl: 3   pioglitazone (ACTOS) 45 MG tablet, Take 45 mg by mouth daily., Disp: , Rfl:    topiramate (TOPAMAX) 100 MG tablet, TAKE 1 TABLET(100 MG) BY MOUTH TWICE DAILY, Disp: 180 tablet, Rfl: 1   traZODone (DESYREL) 100 MG tablet, Start with half tablet for dose 50mg  nightly at bedtime for 2-4 weeks, then increase to 1 whole tab (100mg ) dose nightly, Disp: 30 tablet, Rfl: 2   TRELEGY ELLIPTA 100-62.5-25 MCG/INH AEPB, Inhale 1 puff into the lungs daily., Disp: 60 each, Rfl: 11   Vitamin D, Ergocalciferol, (DRISDOL) 50000 units CAPS capsule, Take 50,000 Units by mouth every 7 (seven) days. On Wednesday, Disp: , Rfl:    zolpidem (AMBIEN) 5 MG tablet, Take 1 tablet (5 mg total) by mouth at bedtime as needed for sleep., Disp: 15 tablet, Rfl: 1  Depression screen [facility] 2/9 01/18/2021 08/06/2020 07/10/2020  Decreased Interest 0 1 2  Down, Depressed, Hopeless 0 1 1  PHQ - 2 Score 0 2 3  Altered sleeping 1 1 3   Tired, decreased energy 1 1 3   Change in appetite 3 1 0  Feeling bad or failure about yourself  0 1 0  Trouble concentrating 0 1 0  Moving slowly or fidgety/restless 0 0 0  Suicidal thoughts 0 0 0  PHQ-9 Score 5 7 9   Difficult doing work/chores Not difficult at  all Not difficult at all Not difficult at all    GAD 7 : Generalized Anxiety Score 01/18/2021 08/06/2020 07/10/2020  Nervous, Anxious, on Edge 0 1 1  Control/stop worrying 0 1 1  Worry too much - different things 0 1 1  Trouble relaxing 1 1 3   Restless - 0 1  Easily annoyed or irritable 1 0 3  Afraid - awful might happen 0 0 0  Total GAD 7 Score - 4 10  Anxiety Difficulty Not difficult at all Not difficult at all Somewhat difficult    -------------------------------------------------------------------------- O: No physical exam performed due to remote telephone encounter.  Lab results reviewed.  No results found for this or any previous visit (from the past 2160 hour(s)).  -------------------------------------------------------------------------- A&P:  Problem List Items Addressed This Visit     Type 2 diabetes mellitus with other specified complication ([place]) - Primary   Other Visit Diagnoses     Diabetic gastroparesis ([place])       Relevant Medications   metoCLOPramide (REGLAN) 5 MG tablet   Umbilical hernia with obstruction, without gangrene       Relevant Orders   Ambulatory referral to General Surgery   Nausea and vomiting, unspecified vomiting type       Relevant Medications   ondansetron (ZOFRAN-ODT) 4 MG disintegrating tablet      Type 2 DM Uncontrolled Suspected DM Gastroparesis Last A1c >12 in 04/2020 She is re-scheduling to get in with Endocrinology now. Previously [facility]. Will order Zofran ODT and also discussed Reglan option, she can try this 5mg  TID PRN for now to see if helpful for likely gastroparesis. Ultimately if sugar can be controlled this should assist with this digestive issue. Next steps would require GI consultation and gastric emptying study.  Umbilical vs Ventral Hernia Large >5 years present. Not appearing to be complicated based on her history Will refer to Gen Surgery for consultation / triage.   Meds ordered this encounter   Medications   metoCLOPramide (REGLAN) 5 MG tablet    Sig: Take 1 tablet (5 mg total) by mouth 3 (three) times daily before meals.    Dispense:  90 tablet    Refill:  2   ondansetron (ZOFRAN-ODT) 4 MG disintegrating tablet    Sig: Take 1 tablet (4 mg total) by mouth every 8 (eight) hours as needed for nausea or vomiting.    Dispense:  30 tablet    Refill:  2    Follow-up: - Return as needed  Patient verbalizes understanding with the above medical recommendations including the limitation of remote medical advice.  Specific follow-up and call-back criteria were given for patient to follow-up or seek medical care more urgently if needed.   - Time spent in direct consultation with patient on phone: 11 minutes  Nobie Putnam, [facility] 01/18/2021, 1:32 PM

## 2021-01-26 DIAGNOSIS — I69951 Hemiplegia and hemiparesis following unspecified cerebrovascular disease affecting right dominant side: Secondary | ICD-10-CM | POA: Diagnosis not present

## 2021-01-28 DIAGNOSIS — Z20822 Contact with and (suspected) exposure to covid-19: Secondary | ICD-10-CM | POA: Diagnosis not present

## 2021-01-29 DIAGNOSIS — Z20822 Contact with and (suspected) exposure to covid-19: Secondary | ICD-10-CM | POA: Diagnosis not present

## 2021-01-29 DIAGNOSIS — M25561 Pain in right knee: Secondary | ICD-10-CM | POA: Diagnosis not present

## 2021-01-29 DIAGNOSIS — M961 Postlaminectomy syndrome, not elsewhere classified: Secondary | ICD-10-CM | POA: Diagnosis not present

## 2021-01-29 DIAGNOSIS — G894 Chronic pain syndrome: Secondary | ICD-10-CM | POA: Diagnosis not present

## 2021-01-30 DIAGNOSIS — Z20822 Contact with and (suspected) exposure to covid-19: Secondary | ICD-10-CM | POA: Diagnosis not present

## 2021-02-01 ENCOUNTER — Ambulatory Visit: Payer: Medicare Other | Admitting: Surgery

## 2021-02-04 ENCOUNTER — Encounter: Payer: Self-pay | Admitting: Surgery

## 2021-02-04 ENCOUNTER — Other Ambulatory Visit: Payer: Self-pay

## 2021-02-04 ENCOUNTER — Ambulatory Visit (INDEPENDENT_AMBULATORY_CARE_PROVIDER_SITE_OTHER): Payer: Medicare Other | Admitting: Surgery

## 2021-02-04 VITALS — BP 85/59 | HR 99 | Temp 98.3°F | Ht 66.0 in | Wt 255.0 lb

## 2021-02-04 DIAGNOSIS — K432 Incisional hernia without obstruction or gangrene: Secondary | ICD-10-CM

## 2021-02-04 DIAGNOSIS — R1084 Generalized abdominal pain: Secondary | ICD-10-CM | POA: Diagnosis not present

## 2021-02-04 NOTE — Patient Instructions (Addendum)
We are going to get you scheduled for a CT scan of your abdomen and pelvis with contrast to better assess your hernia. We will call you about this appointment.   Call to get scheduled with Endocrinology. Call sooner rather than later as it may take a while to get you in to be seen. We need to have your sugars more under control in order for you to heal well.   Continue to work on quitting smoking.   We will have you follow up in 3 months for a recheck.    Ventral Hernia A ventral hernia is a bulge of tissue from inside the abdomen that pushes through a weak area of the muscles that form the front wall of the abdomen. The tissues inside the abdomen are inside a sac (peritoneum). These tissues include the small intestine, large intestine, and the fatty tissue that covers the intestines (omentum). Sometimes, the bulge that forms a hernia contains intestines. Other hernias contain only fat. Ventral hernias do not go away without surgical treatment. There are several types of ventral hernias. You may have: A hernia at an incision site from previous abdominal surgery (incisional hernia). A hernia just above the belly button (epigastric hernia), or at the belly button (umbilical hernia). These types of hernias can develop from heavy lifting or straining. A hernia that comes and goes (reducible hernia). It may be visible only when you lift or strain. This type of hernia can be pushed back into the abdomen (reduced). A hernia that traps abdominal tissue inside the hernia (incarcerated hernia). This type of hernia does not reduce. A hernia that cuts off blood flow to the tissues inside the hernia (strangulated hernia). The tissues can start to die if this happens. This is a very painful bulge that cannot be reduced. A strangulated hernia is a medical emergency. What are the causes? This condition is caused by abdominal tissue putting pressure on an area of weakness in the abdominal muscles. What increases  the risk? The following factors may make you more likely to develop this condition: Being age 58 or older. Being overweight or obese. Having had previous abdominal surgery, especially if there was an infection after surgery. Having had an injury to the abdominal wall. Frequently lifting or pushing heavy objects. Having had several pregnancies. Having a buildup of fluid inside the abdomen (ascites). Straining to have a bowel movement or to urinate. Having frequent coughing episodes. What are the signs or symptoms? The only symptom of a ventral hernia may be a painless bulge in the abdomen. A reducible hernia may be visible only when you strain, cough, or lift. Other symptoms may include: Dull pain. A feeling of pressure. Signs and symptoms of a strangulated hernia may include: Increasing pain. Nausea and vomiting. Pain when pressing on the hernia. The skin over the hernia turning red or purple. Constipation. Blood in the stool (feces). How is this diagnosed? This condition may be diagnosed based on: Your symptoms. Your medical history. A physical exam. You may be asked to cough or strain while standing. These actions increase the pressure inside your abdomen and force the hernia through the opening in your muscles. Your health care provider may try to reduce the hernia by gently pushing the hernia back in. Imaging studies, such as an ultrasound or CT scan. How is this treated? This condition is treated with surgery. If you have a strangulated hernia, surgery is done as soon as possible. If your hernia is small and not incarcerated,  you may be asked to lose some weight before surgery. Follow these instructions at home: Follow instructions from your health care provider about eating or drinking restrictions. If you are overweight, your health care provider may recommend that you increase your activity level and eat a healthier diet. Do not lift anything that is heavier than 10 lb (4.5  kg), or the limit that you are told, until your health care provider says that it is safe. Return to your normal activities as told by your health care provider. Ask your health care provider what activities are safe for you. You may need to avoid activities that increase pressure on your hernia. Take over-the-counter and prescription medicines only as told by your health care provider. Keep all follow-up visits. This is important. Contact a health care provider if: Your hernia gets larger. Your hernia becomes painful. Get help right away if: Your hernia becomes increasingly painful. You have pain along with any of the following: Changes in skin color in the area of the hernia. Nausea. Vomiting. Fever. These symptoms may represent a serious problem that is an emergency. Do not wait to see if the symptoms will go away. Get medical help right away. Call your local emergency services (911 in the [place] Do not drive yourself to the hospital. Summary A ventral hernia is a bulge of tissue from inside the abdomen that pushes through a weak area of the muscles that form the front wall of the abdomen. This condition is treated with surgery, which may be urgent depending on your hernia. Do not lift anything that is heavier than 10 lb (4.5 kg), and follow activity instructions from your health care provider. This information is not intended to replace advice given to you by your health care provider. Make sure you discuss any questions you have with your health care provider. Document Revised: 08/19/2019 Document Reviewed: 08/19/2019 Elsevier Patient Education  [place].

## 2021-02-04 NOTE — Progress Notes (Signed)
02/04/2021  Reason for Visit:  Recurrent ventral hernia  Requesting Provider: Nobie Putnam, DO  History of Present Illness: [name] is a 56 y.o. female presenting for evaluation for an incisional ventral hernia.  The patient reports that she has noticed this hernia for a few years, probably between 3 to 5 years.  However it has been more recently in the last few months that she has noticed some more discomfort associated with it.  The patient reports that she had many years ago and open hernia repair of her abdominal wall at [facility] and she had 2 drains after that with no complications.  She reports that the surgery was probably about 20 years ago.  The patient reports that more recently she is having pain at the superior portion of this incision with bulging sensation.  Sometimes she has nausea.  The pain is localized in the area of bulging and has mild pulling sensation in the surrounding area immediately adjacent.  The pain is not diffuse.  The pain is not constant and varies depending on what she is doing.  The patient does report some nausea, but no vomiting, no chest pain, no shortness of breath, no diarrhea but some constipation.  Of note, the patient does have a significant medical history significant for COPD, uncontrolled diabetes with an A1c of 12.5 a year ago.  She is trying to set up an appointment at the [facility] clinic endocrinology but is having issues with transportation set up.  She also smokes but reports that is cutting down.  She used to smoke 2 packs a day and now is down to about half a pack a day.  She also has morbid obesity with a BMI of 41.  She also has had issues with wound infections and just recently completed treatment for a right foot diabetic ulcer.  She has trouble ambulating due to history of stroke in the past as well as a right lower extremity fracture and uses a wheelchair.  Past Medical History: Past Medical History:  Diagnosis Date   Anxiety     Cancer ([place])    a spot on liver and treated    Complication of anesthesia    restless,easily upset   COPD (chronic obstructive pulmonary disease) (HCC)    Depression    Diverticulitis    Fatty liver    GERD (gastroesophageal reflux disease)    Headache(784.0)    migraines   History of kidney stones    Hyperlipidemia    Hypertension    Pneumonia    Restless    Stroke [facility])      Past Surgical History: Past Surgical History:  Procedure Laterality Date   ABDOMINAL HYSTERECTOMY     ACHILLES TENDON LENGTHENING Right 08/18/2017   Procedure: ACHILLES TENDON LENGTHENING;  Surgeon: Altamese Roberts, MD;  Location: [place];  Service: Orthopedics;  Laterality: Right;   ANTERIOR CERVICAL DECOMP/DISCECTOMY FUSION N/A 03/11/2012   Procedure: ANTERIOR CERVICAL DECOMPRESSION/DISCECTOMY FUSION 1 LEVEL;  Surgeon: Ophelia Charter, MD;  Location: [place] NEURO ORS;  Service: Neurosurgery;  Laterality: N/A;  Cervical five-six anterior cervical decompression with fusion interbody prothesis plating and bonegraft   BACK SURGERY     EXTERNAL FIXATION LEG Right 08/10/2017   Procedure: EXTERNAL FIXATION ANKLE;  Surgeon: Dereck Leep, MD;  Location: [facility] ORS;  Service: Orthopedics;  Laterality: Right;   EXTERNAL FIXATION REMOVAL Right 08/18/2017   Procedure: REMOVAL EXTERNAL FIXATION LEG;  Surgeon: Altamese Felton, MD;  Location: [place];  Service: Orthopedics;  Laterality: Right;   EYE SURGERY     HERNIA REPAIR     JOINT REPLACEMENT     bil knees   ORIF ANKLE FRACTURE Right 08/18/2017   Procedure: OPEN REDUCTION INTERNAL FIXATION (ORIF) ANKLE FRACTURE;  Surgeon: Altamese [place], MD;  Location: [place];  Service: Orthopedics;  Laterality: Right;   REPLACEMENT TOTAL KNEE BILATERAL      Home Medications: Prior to Admission medications   Medication Sig Start Date End Date Taking? Authorizing Provider  albuterol (VENTOLIN HFA) 108 (90 Base) MCG/ACT inhaler INHALE 2 PUFFS INTO THE LUNGS EVERY 4 HOURS AS NEEDED FOR WHEEZING  OR SHORTNESS OF BREATH 10/09/20  Yes Karamalegos, Devonne Doughty, DO  atorvastatin (LIPITOR) 40 MG tablet Take 1 tablet (40 mg total) by mouth at bedtime. 11/28/20 11/28/21 Yes Karamalegos, Devonne Doughty, DO  buPROPion (WELLBUTRIN XL) 150 MG 24 hr tablet TAKE 1 TABLET(150 MG) BY MOUTH DAILY 11/10/20  Yes Karamalegos, Devonne Doughty, DO  citalopram (CELEXA) 40 MG tablet TAKE 1 TABLET(40 MG) BY MOUTH DAILY 01/09/21  Yes Karamalegos, Devonne Doughty, DO  clopidogrel (PLAVIX) 75 MG tablet Take 1 tablet (75 mg total) by mouth daily. 12/24/20  Yes Karamalegos, Devonne Doughty, DO  gabapentin (NEURONTIN) 600 MG tablet Take 600 mg by mouth 4 (four) times daily. 05/29/20  Yes [provider]  insulin degludec (TRESIBA FLEXTOUCH) 200 UNIT/ML FlexTouch Pen Inject 14 Units into the skin at bedtime. 04/23/20  Yes [provider]  linagliptin (TRADJENTA) 5 MG TABS tablet Take 5 mg by mouth daily. 07/08/13  Yes [provider]  metFORMIN (GLUCOPHAGE) 1000 MG tablet Take 1,000 mg by mouth 2 (two) times daily with a meal. 04/25/20  Yes [provider]  metoCLOPramide (REGLAN) 5 MG tablet Take 1 tablet (5 mg total) by mouth 3 (three) times daily before meals. 01/18/21  Yes Karamalegos, Devonne Doughty, DO  NYSTATIN powder APPLY TOPICALLY A SMALL AMOUNT TWICE DAILY AS NEEDED 11/22/20  Yes Karamalegos, Devonne Doughty, DO  ondansetron (ZOFRAN-ODT) 4 MG disintegrating tablet Take 1 tablet (4 mg total) by mouth every 8 (eight) hours as needed for nausea or vomiting. 01/18/21  Yes Karamalegos, Devonne Doughty, DO  oxyCODONE (ROXICODONE) 15 MG immediate release tablet Take 15 mg by mouth every 6 (six) hours as needed. 09/18/20  Yes [provider]  pantoprazole (PROTONIX) 40 MG tablet Take 1 tablet (40 mg total) by mouth daily before breakfast. 08/09/20  Yes Karamalegos, Devonne Doughty, DO  pioglitazone (ACTOS) 45 MG tablet Take 45 mg by mouth daily.   Yes [provider]  topiramate (TOPAMAX) 100 MG tablet TAKE 1  TABLET(100 MG) BY MOUTH TWICE DAILY 01/09/21  Yes Karamalegos, Devonne Doughty, DO  traZODone (DESYREL) 100 MG tablet Start with half tablet for dose 50mg  nightly at bedtime for 2-4 weeks, then increase to 1 whole tab (100mg ) dose nightly 11/28/20  Yes Karamalegos, Alexander J, DO  TRELEGY ELLIPTA 100-62.5-25 MCG/INH AEPB Inhale 1 puff into the lungs daily. 07/23/20  Yes Karamalegos, Devonne Doughty, DO  Vitamin D, Ergocalciferol, (DRISDOL) 50000 units CAPS capsule Take 50,000 Units by mouth every 7 (seven) days. On Wednesday   Yes [provider]  zolpidem (AMBIEN) 5 MG tablet Take 1 tablet (5 mg total) by mouth at bedtime as needed for sleep. 11/28/20  Yes Karamalegos, Devonne Doughty, DO  nystatin (MYCOSTATIN) 100000 UNIT/ML suspension Take 5 mLs (500,000 Units total) by mouth 4 (four) times daily. For 7-10 days 10/22/20   Olin Hauser, DO    Allergies:  Allergies  Allergen Reactions   Tramadol Nausea And Vomiting and Hypertension   Augmentin [Amoxicillin-Pot Clavulanate] Rash    Social History:  reports that she has been smoking cigarettes. She has a 30.00 pack-year smoking history. She has never used smokeless tobacco. She reports current drug use. Drug: Marijuana. She reports that she does not drink alcohol.   Family History: Family History  Problem Relation Age of Onset   Diabetes Mother    Hypertension Mother    Hypertension Father     Review of Systems: Review of Systems  Constitutional:  Negative for chills and fever.  HENT:  Negative for hearing loss.   Respiratory:  Negative for shortness of breath.   Cardiovascular:  Negative for chest pain.  Gastrointestinal:  Positive for abdominal pain, constipation and nausea. Negative for diarrhea and vomiting.  Genitourinary:  Negative for dysuria.  Musculoskeletal:  Negative for myalgias.  Skin:  Negative for rash.  Neurological:  Negative for dizziness.  Psychiatric/Behavioral:  Negative for depression.    Physical  Exam BP (!) 85/59    Pulse 99    Temp 98.3 F (36.8 C)    Ht 5\' 6"  (1.676 m)    Wt 255 lb (115.7 kg)    SpO2 94%    BMI 41.16 kg/m  CONSTITUTIONAL: No acute distress HEENT:  Normocephalic, atraumatic, extraocular motion intact. NECK: Trachea is midline, and there is no jugular venous distension.  RESPIRATORY:  Lungs are clear, and breath sounds are equal bilaterally. Normal respiratory effort without pathologic use of accessory muscles. CARDIOVASCULAR: Heart is regular without murmurs, gallops, or rubs. GI: The abdomen is soft, nondistended, with some mild tenderness to palpation at the patient's hernia site.  Patient has a midline incision consistent with a prior surgical hernia repair.  At the superior portion of this incision, the patient does have an area of about 5 cm of bulging with what appears to be colon.  This is reducible although with some discomfort with palpation.  Cannot discern if there are other hernias in her midline, at least does not appear to have discrete defects. MUSCULOSKELETAL:  No peripheral edema or cyanosis. SKIN: Skin turgor is normal. There are no pathologic skin lesions.  NEUROLOGIC:  Cranial nerves are grossly intact. PSYCH:  Alert and oriented to person, place and time. Affect is normal.  Laboratory Analysis: Labs from 11/23/2020: Sodium 135, potassium 3.6, chloride 100, CO2 20, BUN 10, creatinine 0.5, glucose 463.  Total bilirubin 0.5, AST 17, ALT 16, alkaline phosphatase 107, lipase 37, albumin 4.1.  WBC 11.9, hemoglobin 15.9, hematocrit 47.6, platelets 404.  Imaging: CT abdomen/pelvis on 08/18/2018 [facility]): FINDINGS:  LOWER THORAX: Normal.  HEPATOBILIARY: No focal hepatic lesions. The gallbladder is present and otherwise unremarkable. No biliary dilatation.  SPLEEN: Unremarkable.  PANCREAS: Unremarkable.  ADRENALS: Unremarkable.  KIDNEYS/URETERS: Unremarkable.  BLADDER: Unremarkable.  PELVIC/REPRODUCTIVE ORGANS: Uterus is surgically absent. No suspicious  adnexal masses.  GI TRACT: Colonic diverticulosis without evidence of diverticulitis. Appendix not visualized. History of  appendectomy  PERITONEUM/RETROPERITONEUM AND MESENTERY: No free air or fluid.  LYMPH NODES: No enlarged lymph nodes.  VESSELS: The aorta is normal in caliber.  No significant calcified atherosclerotic disease. The portal venous system is patent. The hepatic vein and IVC are unremarkable.  BONES AND SOFT TISSUES: Bilateral fat-containing inguinal hernias. Small rectus diastases. Unchanged sequelae of L4-L5 posterior fusion. No evidence of acute hardware abnormality. Sequela of ventral hernia repair.   IMPRESSION:  No CT evidence of intra-abdominal pathology.  Assessment and Plan:  This is a 56 y.o. female with a recurrent ventral hernia.  - Patient reports that she had open hernia repair at [facility] which had 2 drains in place after surgery many years ago.  I am unable to see any records from [facility] showing this.  If there were drains involved, this probably was an anterior component separation with mesh but again I do not have any documentation of this.  The patient had a CT scan of abdomen/pelvis at [facility] on 08/18/2018 which does not report any ventral hernias, but I am unable to see any imaging.  Nonetheless, this image study is 2.58 yrs old and we would need a new CT scan to better evaluate her abdominal wall anatomy.  Discussed with her that we would order a CT scan to evaluate. --My main concern is with regards to her other medical issues.  She does have COPD, uncontrolled diabetes, smokes, and has morbid obesity.  These are all factors that would increase the risk of intraoperative and postoperative complications.  Discussed with her that she definitely needs to have her diabetes better controlled and needs to set up an appointment with her endocrinologist to bring her A1c down.  Also encouraged her to continue cutting down her smoking and quit smoking to help with wound healing in the  future. - At this point, would not be rushing into surgery and we will have her follow-up with me in about 3 months to evaluate her progress.  I spent 60 minutes dedicated to the care of this patient on the date of this encounter to include pre-visit review of records, face-to-face time with the patient discussing diagnosis and management, and any post-visit coordination of care.   Melvyn Neth, [facility]

## 2021-02-06 ENCOUNTER — Telehealth: Payer: Self-pay

## 2021-02-06 NOTE — Telephone Encounter (Addendum)
Message left for the patient to call back about her CT scan.  The patient is scheduled for a CT at [facility] on Monday January 30th. She will need to arrive at the [place] at 10:00 am and will have nothing to eat or drink for 4 hours before her scan. She will need to pick up a prep kit before her scan day. She may do this at [facility] [place] entrance, at the Radiology desk. She also needs to get lab work done and can do this when she picks up her prep, just let the check in desk know she needs labs done.

## 2021-02-07 ENCOUNTER — Other Ambulatory Visit: Payer: Self-pay

## 2021-02-07 DIAGNOSIS — R1084 Generalized abdominal pain: Secondary | ICD-10-CM

## 2021-02-07 DIAGNOSIS — K432 Incisional hernia without obstruction or gangrene: Secondary | ICD-10-CM

## 2021-02-07 NOTE — Telephone Encounter (Signed)
Spoke with the patient and she is aware of date, time, and instructions.

## 2021-02-11 ENCOUNTER — Telehealth: Payer: Medicare Other

## 2021-02-11 ENCOUNTER — Telehealth: Payer: Self-pay | Admitting: Pharmacist

## 2021-02-11 ENCOUNTER — Ambulatory Visit: Admission: RE | Admit: 2021-02-11 | Payer: Medicare Other | Source: Ambulatory Visit

## 2021-02-11 NOTE — Telephone Encounter (Signed)
°  Chronic Care Management   Outreach Note  02/11/2021 Name: [name] MRN: [id number] DOB: 1965/08/22  Referred by: Olin Hauser, DO Reason for referral : No chief complaint on file.   Was unable to reach patient via telephone today and have left HIPAA compliant voicemail asking patient to return my call.   Follow Up Plan: Will collaborate with Care Guide to outreach to schedule follow up with me  Wallace Cullens, PharmD, [facility] [phone]

## 2021-02-12 ENCOUNTER — Other Ambulatory Visit: Payer: Self-pay | Admitting: Family Medicine

## 2021-02-12 DIAGNOSIS — Z87891 Personal history of nicotine dependence: Secondary | ICD-10-CM | POA: Diagnosis not present

## 2021-02-12 DIAGNOSIS — M25561 Pain in right knee: Secondary | ICD-10-CM | POA: Diagnosis not present

## 2021-02-12 DIAGNOSIS — E1169 Type 2 diabetes mellitus with other specified complication: Secondary | ICD-10-CM

## 2021-02-12 DIAGNOSIS — I69351 Hemiplegia and hemiparesis following cerebral infarction affecting right dominant side: Secondary | ICD-10-CM | POA: Diagnosis not present

## 2021-02-12 DIAGNOSIS — M542 Cervicalgia: Secondary | ICD-10-CM | POA: Diagnosis not present

## 2021-02-12 DIAGNOSIS — Z79891 Long term (current) use of opiate analgesic: Secondary | ICD-10-CM | POA: Diagnosis not present

## 2021-02-12 DIAGNOSIS — M961 Postlaminectomy syndrome, not elsewhere classified: Secondary | ICD-10-CM | POA: Diagnosis not present

## 2021-02-12 DIAGNOSIS — M545 Low back pain, unspecified: Secondary | ICD-10-CM | POA: Diagnosis not present

## 2021-02-12 DIAGNOSIS — Z720 Tobacco use: Secondary | ICD-10-CM | POA: Diagnosis not present

## 2021-02-12 NOTE — Telephone Encounter (Signed)
Medication Refill - Medication: metFORMIN (GLUCOPHAGE) 1000 MG tablet/ Dr. Raliegh Ip has not filled this medication for this pt yet and pt has changed pharmacies and was advised to call office to request refill   Has the patient contacted their pharmacy? Yes.   (Agent: If no, request that the patient contact the pharmacy for the refill. If patient does not wish to contact the pharmacy document the reason why and proceed with request.) (Agent: If yes, when and what did the pharmacy advise?) call pcp  Preferred Pharmacy (with phone number or street name): [facility] [place], [place] [address]  [place], [place] [address]  Phone:  [phone]  Fax:  [phone] Has the patient been seen for an appointment in the last year OR does the patient have an upcoming appointment? Yes.    Agent: Please be advised that RX refills may take up to 3 business days. We ask that you follow-up with your pharmacy.

## 2021-02-12 NOTE — Telephone Encounter (Signed)
° °Last refill:  by Historical Provider, no date  ° °Future visit scheduled: no, just seen 01/18/21 ° °Notes to clinic:  Historical Provider, please assess. ° °Requested Prescriptions  °Pending Prescriptions Disp Refills  ° metFORMIN (GLUCOPHAGE) 1000 MG tablet    °  Sig: Take 1 tablet (1,000 mg total) by mouth 2 (two) times daily with a meal.  °  ° Endocrinology:  Diabetes - Biguanides Failed - 02/12/2021  5:37 PM  °  °  Failed - Cr in normal range and within 360 days  °  Creatinine  °Date Value Ref Range Status  °09/22/2013 1.05 0.60 - 1.30 mg/dL Final  ° °Creatinine, Ser  °Date Value Ref Range Status  °09/23/2017 0.71 0.40 - 1.20 mg/dL Final  °  °  °  °  Failed - HBA1C is between 0 and 7.9 and within 180 days  °  Hgb A1c MFr Bld  °Date Value Ref Range Status  °08/09/2017 9.2 (H) 4.8 - 5.6 % Final  °  Comment:  °  (NOTE) °Pre diabetes:          5.7%-6.4% °Diabetes:              >6.4% °Glycemic control for   <7.0% °adults with diabetes °  °  °  °  °  Failed - eGFR in normal range and within 360 days  °  EGFR (African American)  °Date Value Ref Range Status  °09/22/2013 >60  Final  ° °GFR calc Af Amer  °Date Value Ref Range Status  °08/18/2017 >60 >60 mL/min Final  °  Comment:  °  (NOTE) °The eGFR has been calculated using the CKD EPI equation. °This calculation has not been validated in all clinical situations. °eGFR's persistently <60 mL/min signify possible Chronic Kidney °Disease. °  ° °EGFR (Non-African Amer.)  °Date Value Ref Range Status  °09/22/2013 >60  Final  °  Comment:  °  eGFR values <60mL/min/1.73 m2 may be an indication of chronic °kidney disease (CKD). °Calculated eGFR is useful in patients with stable renal function. °The eGFR calculation will not be reliable in acutely ill patients °when serum creatinine is changing rapidly. It is not useful in  °patients on dialysis. The eGFR calculation may not be applicable °to patients at the low and high extremes of body sizes, pregnant °women, and  vegetarians. °  ° °GFR calc non Af Amer  °Date Value Ref Range Status  °08/18/2017 >60 >60 mL/min Final  ° °GFR  °Date Value Ref Range Status  °09/23/2017 91.12 >60.00 mL/min Final  °  °  °  °  Failed - B12 Level in normal range and within 720 days  °  Vitamin B-12  °Date Value Ref Range Status  °04/07/2017 252 180 - 914 pg/mL Final  °  Comment:  °  (NOTE) °This assay is not validated for testing neonatal or °myeloproliferative syndrome specimens for Vitamin B12 levels. °Performed at [facility], [address] [place], [place] °[address] °  °  °  °  °  Failed - CBC within normal limits and completed in the last 12 months  °  WBC  °Date Value Ref Range Status  °08/18/2017 9.5 4.0 - 10.5 K/uL Final  ° °RBC  °Date Value Ref Range Status  °08/18/2017 4.94 3.87 - 5.11 MIL/uL Final  ° °Hemoglobin  °Date Value Ref Range Status  °08/18/2017 15.3 (H) 12.0 - 15.0 g/dL Final  ° °HGB  °Date Value Ref Range Status  °  Status  09/22/2013 14.6 12.0 - 16.0 g/dL Final   HCT  Date Value Ref Range Status  08/18/2017 45.0 36.0 - 46.0 % Final  09/22/2013 44.3 35.0 - 47.0 % Final   MCHC  Date Value Ref Range Status  08/18/2017 31.9 30.0 - 36.0 g/dL Final   [facility]  Date Value Ref Range Status  08/18/2017 28.5 26.0 - 34.0 pg Final   MCV  Date Value Ref Range Status  08/18/2017 89.5 78.0 - 100.0 fL Final  09/22/2013 90 80 - 100 fL Final   No results found for: PLTCOUNTKUC, LABPLAT, POCPLA RDW  Date Value Ref Range Status  08/18/2017 13.8 11.5 - 15.5 % Final  09/22/2013 13.8 11.5 - 14.5 % Final         Passed - Valid encounter within last 6 months    Recent Outpatient Visits           3 weeks ago Type 2 diabetes mellitus with other specified complication, with long-term current use of insulin ([place])   [place], DO   2 months ago Psychophysiological insomnia   [place], DO   3 months ago Diabetic ulcer of toe of right foot  associated with type 2 diabetes mellitus, limited to breakdown of skin [facility])   [facility], Devonne Doughty, DO   4 months ago Diabetic ulcer of toe of right foot associated with type 2 diabetes mellitus, limited to breakdown of skin [facility])   [place], DO   6 months ago Moderate episode of recurrent major depressive disorder [facility])   [place], Devonne Doughty, DO

## 2021-02-13 MED ORDER — METFORMIN HCL 1000 MG PO TABS: 1000.0000 mg | ORAL_TABLET | Freq: Two times a day (BID) | ORAL | 1 refills | Status: DC

## 2021-02-15 ENCOUNTER — Telehealth: Payer: Self-pay

## 2021-02-15 NOTE — Chronic Care Management (AMB) (Signed)
°  Care Management   Note  02/15/2021 Name: CACEY WILLOW MRN: [id number] DOB: 1965/06/05  Gwenith Spitz is a 56 y.o. year old female who is a primary care patient of Olin Hauser, DO and is actively engaged with the care management team. I reached out to Gwenith Spitz by phone today to assist with re-scheduling a follow up visit with the Pharmacist  Follow up plan: Unsuccessful telephone outreach attempt made. A HIPAA compliant phone message was left for the patient providing contact information and requesting a return call.  The care management team will reach out to the patient again over the next 7 days.  If patient returns call to provider office, please advise to call Kelly  at [place], [place], [place], [place] [address] Direct Dial: [phone] [email or web address] Website: [email or web address]

## 2021-02-21 ENCOUNTER — Ambulatory Visit
Admission: RE | Admit: 2021-02-21 | Discharge: 2021-02-21 | Disposition: A | Discharge status: Home / Self Care | Payer: Medicare Other | Source: Ambulatory Visit | Attending: Surgery | Admitting: Surgery

## 2021-02-21 ENCOUNTER — Other Ambulatory Visit: Payer: Self-pay

## 2021-02-21 DIAGNOSIS — R109 Unspecified abdominal pain: Secondary | ICD-10-CM | POA: Diagnosis not present

## 2021-02-21 DIAGNOSIS — R1084 Generalized abdominal pain: Secondary | ICD-10-CM | POA: Diagnosis not present

## 2021-02-21 DIAGNOSIS — K432 Incisional hernia without obstruction or gangrene: Secondary | ICD-10-CM | POA: Diagnosis not present

## 2021-02-21 DIAGNOSIS — I700 Atherosclerosis of aorta: Secondary | ICD-10-CM | POA: Diagnosis not present

## 2021-02-21 LAB — POCT I-STAT CREATININE: Creatinine, Ser: 0.7 mg/dL (ref 0.44–1.00)

## 2021-02-21 MED ORDER — IOHEXOL 300 MG/ML  SOLN
100.0000 mL | Freq: Once | INTRAMUSCULAR | Status: AC | PRN
  Administered 2021-02-21: 100 mL via INTRAVENOUS

## 2021-02-21 NOTE — Chronic Care Management (AMB) (Signed)
°  Care Management   Note  02/21/2021 Name: [name] MRN: [id number] DOB: 10-22-1965  [name] is a 56 y.o. year old female who is a primary care patient of Olin Hauser, DO and is actively engaged with the care management team. I reached out to [name] by phone today to assist with re-scheduling a follow up visit with the Pharmacist  Follow up plan: Unsuccessful telephone outreach attempt made. A HIPAA compliant phone message was left for the patient providing contact information and requesting a return call.  The care management team will reach out to the patient again over the next 7 days.  If patient returns call to provider office, please advise to call [place] at [phone]   Noreene Larsson, [place], [place], [place] [address] Direct Dial: [phone] [email or web address] Website: [email or web address]

## 2021-02-22 ENCOUNTER — Other Ambulatory Visit: Payer: Self-pay | Admitting: Family Medicine

## 2021-02-22 DIAGNOSIS — F331 Major depressive disorder, recurrent, moderate: Secondary | ICD-10-CM

## 2021-02-22 NOTE — Chronic Care Management (AMB) (Signed)
°  Care Management   Note  02/22/2021 Name: [name] MRN: [id number] DOB: 12-28-1965  [name] is a 56 y.o. year old female who is a primary care patient of Olin Hauser, DO and is actively engaged with the care management team. I reached out to [name] by phone today to assist with re-scheduling a follow up visit with the Pharmacist  Follow up plan: Telephone appointment with care management team member scheduled for:03/04/2021  Noreene Larsson, [place], [place], [place] [address] Direct Dial: [phone] [email or web address] Website: [email or web address]

## 2021-02-22 NOTE — Telephone Encounter (Signed)
Requested medication (s) are due for refill today: yes  Requested medication (s) are on the active medication list: yes    Last refill: 11/10/20  #90  0 refills  Future visit scheduled no  Notes to clinic:Failed due to labs. Please review.  Requested Prescriptions  Pending Prescriptions Disp Refills   buPROPion (WELLBUTRIN XL) 150 MG 24 hr tablet [Pharmacy Med Name: BUPROPION XL 150MG  TABLETS (24 H)] 90 tablet 0    Sig: TAKE 1 TABLET(150 MG) BY MOUTH DAILY     Psychiatry: Antidepressants - bupropion Failed - 02/22/2021  9:26 AM      Failed - AST in normal range and within 360 days    AST  Date Value Ref Range Status  08/18/2017 22 15 - 41 U/L Final   SGOT(AST)  Date Value Ref Range Status  09/22/2013 17 15 - 37 Unit/L Final          Failed - ALT in normal range and within 360 days    ALT  Date Value Ref Range Status  08/18/2017 16 0 - 44 U/L Final   SGPT (ALT)  Date Value Ref Range Status  09/22/2013 16 U/L Final    Comment:    14-63 NOTE: New Reference Range 08/02/13           Failed - Last BP in normal range    BP Readings from Last 1 Encounters:  02/04/21 (!) 85/59          Passed - Cr in normal range and within 360 days    Creatinine  Date Value Ref Range Status  09/22/2013 1.05 0.60 - 1.30 mg/dL Final   Creatinine, Ser  Date Value Ref Range Status  02/21/2021 0.70 0.44 - 1.00 mg/dL Final          Passed - Completed PHQ-2 or PHQ-9 in the last 360 days      Passed - Valid encounter within last 6 months    Recent Outpatient Visits           1 month ago Type 2 diabetes mellitus with other specified complication, with long-term current use of insulin ([place])   [place], DO   2 months ago Psychophysiological insomnia   [place], DO   4 months ago Diabetic ulcer of toe of right foot associated with type 2 diabetes mellitus, limited to breakdown of skin [facility])    [facility], Devonne Doughty, DO   5 months ago Diabetic ulcer of toe of right foot associated with type 2 diabetes mellitus, limited to breakdown of skin [facility])   [place], DO   6 months ago Moderate episode of recurrent major depressive disorder [facility])   [facility], Devonne Doughty, DO

## 2021-02-26 DIAGNOSIS — I69951 Hemiplegia and hemiparesis following unspecified cerebrovascular disease affecting right dominant side: Secondary | ICD-10-CM | POA: Diagnosis not present

## 2021-02-26 NOTE — Progress Notes (Signed)
02/26/21 CT scan images viewed and reviewed.  Patient has two areas of potential hernia defects along the midline, in the umbilicus and just inferior to it.  No evidence of strangulation.  Results released to patient via MyChart.  Patient has follow up with me in two months to check on her diabetes and smoking cessation.  Olean Ree, MD

## 2021-02-27 ENCOUNTER — Ambulatory Visit: Payer: Self-pay | Admitting: *Deleted

## 2021-02-27 NOTE — Telephone Encounter (Signed)
Summary: stomach discomfort / rx request   The patient shares that they're experiencing stomach discomfort   The patient shares that they were previously seen by a surgeon who pressed on their stomach, the patient has a previous history of hernias and stomach discomfort   The patient would like to be re-prescribed dicyclomine (BENTYL) tablet 20 mg to help with their discomfort   Please contact further     Reason for Disposition  [1] MODERATE pain (e.g., interferes with normal activities) AND [2] pain comes and goes (cramps) AND [3] present > 24 hours  (Exception: pain with Vomiting or Diarrhea - see that Guideline)  Answer Assessment - Initial Assessment Questions 1. LOCATION: "Where does it hurt?"      middle 2. RADIATION: "Does the pain shoot anywhere else?" (e.g., chest, back)     When peaked- travels to area of LLQ- emptying bowel helps pain ease 3. ONSET: "When did the pain begin?" (e.g., minutes, hours or days ago)      Chronic 4. SUDDEN: "Gradual or sudden onset?"     Gradual- after abdominal exam with surgeon- did have dark constipated stool after exam- has cleared  5. PATTERN "Does the pain come and go, or is it constant?"    - If constant: "Is it getting better, staying the same, or worsening?"      (Note: Constant means the pain never goes away completely; most serious pain is constant and it progresses)     - If intermittent: "How long does it last?" "Do you have pain now?"     (Note: Intermittent means the pain goes away completely between bouts)     Comes and goes- always some sort of discomfort 6. SEVERITY: "How bad is the pain?"  (e.g., Scale 1-10; mild, moderate, or severe)   - MILD (1-3): doesn't interfere with normal activities, abdomen soft and not tender to touch    - MODERATE (4-7): interferes with normal activities or awakens from sleep, abdomen tender to touch    - SEVERE (8-10): excruciating pain, doubled over, unable to do any normal activities       moderate 7. RECURRENT SYMPTOM: "Have you ever had this type of stomach pain before?" If Yes, ask: "When was the last time?" and "What happened that time?"      chronic 8. CAUSE: "What do you think is causing the stomach pain?"     unsure 9. RELIEVING/AGGRAVATING FACTORS: "What makes it better or worse?" (e.g., movement, antacids, bowel movement)     BM helped 10. OTHER SYMPTOMS: "Do you have any other symptoms?" (e.g., back pain, diarrhea, fever, urination pain, vomiting)       No - patient had vomiting  2 weeks ago- it did improve- nausea present 11. PREGNANCY: "Is there any chance you are pregnant?" "When was your last menstrual period?"       *No Answer*  Protocols used: Abdominal Pain - [facility]

## 2021-02-27 NOTE — Telephone Encounter (Signed)
°  Chief Complaint: abdominal discomfort- medication request Symptoms: abdominal discomfort, comes and goes, somewhat relieved by BM, nausea Frequency: chronic- but worse since 1/23 Pertinent Negatives: Patient denies  back pain, diarrhea, fever, urination pain Disposition: [] ED /[] Urgent Care (no appt availability in office) / [] Appointment(In office/virtual)/ []  [facility]/ [] Home Care/ [] Refused Recommended Disposition /[] [facility]/ []  Follow-up with PCP Additional Notes: Patient is requesting Rx- Bentyl and has been scheduled for appointment Monday. Per triage- patient has 24 hour disposition. Patient thinks the medication might help her discomfort. No appointment available and follow up with surgeon is not until April.

## 2021-03-01 DIAGNOSIS — M961 Postlaminectomy syndrome, not elsewhere classified: Secondary | ICD-10-CM | POA: Diagnosis not present

## 2021-03-01 DIAGNOSIS — G894 Chronic pain syndrome: Secondary | ICD-10-CM | POA: Diagnosis not present

## 2021-03-01 DIAGNOSIS — M25561 Pain in right knee: Secondary | ICD-10-CM | POA: Diagnosis not present

## 2021-03-04 ENCOUNTER — Ambulatory Visit (INDEPENDENT_AMBULATORY_CARE_PROVIDER_SITE_OTHER): Payer: Medicare Other | Admitting: Family Medicine

## 2021-03-04 ENCOUNTER — Encounter: Payer: Self-pay | Admitting: Family Medicine

## 2021-03-04 ENCOUNTER — Telehealth: Payer: Medicare Other

## 2021-03-04 ENCOUNTER — Ambulatory Visit (INDEPENDENT_AMBULATORY_CARE_PROVIDER_SITE_OTHER): Payer: Medicare Other

## 2021-03-04 ENCOUNTER — Other Ambulatory Visit: Payer: Self-pay

## 2021-03-04 VITALS — BP 101/58 | HR 79 | Temp 97.1°F | Resp 18 | Ht 66.0 in | Wt 231.0 lb

## 2021-03-04 DIAGNOSIS — R109 Unspecified abdominal pain: Secondary | ICD-10-CM

## 2021-03-04 DIAGNOSIS — Z1231 Encounter for screening mammogram for malignant neoplasm of breast: Secondary | ICD-10-CM | POA: Diagnosis not present

## 2021-03-04 DIAGNOSIS — K420 Umbilical hernia with obstruction, without gangrene: Secondary | ICD-10-CM | POA: Diagnosis not present

## 2021-03-04 DIAGNOSIS — Z794 Long term (current) use of insulin: Secondary | ICD-10-CM

## 2021-03-04 DIAGNOSIS — Z0000 Encounter for general adult medical examination without abnormal findings: Secondary | ICD-10-CM

## 2021-03-04 DIAGNOSIS — B370 Candidal stomatitis: Secondary | ICD-10-CM | POA: Diagnosis not present

## 2021-03-04 DIAGNOSIS — E1169 Type 2 diabetes mellitus with other specified complication: Secondary | ICD-10-CM

## 2021-03-04 MED ORDER — FLUCONAZOLE 150 MG PO TABS: 150.0000 mg | ORAL_TABLET | Freq: Every day | ORAL | 1 refills | Status: DC

## 2021-03-04 MED ORDER — DICYCLOMINE HCL 20 MG PO TABS: 20.0000 mg | ORAL_TABLET | Freq: Three times a day (TID) | ORAL | 2 refills | Status: DC

## 2021-03-04 NOTE — Progress Notes (Signed)
Subjective:    Patient ID: [name], female    DOB: 01-25-1965, 56 y.o.   MRN: [id number]  [name] is a 56 y.o. female presenting on 03/04/2021 for Abdominal Pain  Patient has seen Donnie Mesa CMA today for AMW  HPI  Recurrent Ventral Hernia Abdominal pain Recent history with work up by Gen Surgery and has had CT. See below. She admits worsening abdominal pain after evaluation. She is working on A1c and quit smoking in order to be able to proceed w/ surgery She has history of abdominal cramping and bloating gas, improved on Bentyl PRN 20mg  Needs new order  Oral Thrush Due to inhaler still recurrent flares. Improved on diflucan in past.  T2DM She is trying to schedule to return with Dr Gabriel Carina for [facility] for her diabetes management  Depression screen [facility] 2/9 03/04/2021 01/18/2021 08/06/2020  Decreased Interest 1 0 1  Down, Depressed, Hopeless 1 0 1  PHQ - 2 Score 2 0 2  Altered sleeping 2 1 1   Tired, decreased energy 3 1 1   Change in appetite 2 3 1   Feeling bad or failure about yourself  0 0 1  Trouble concentrating 1 0 1  Moving slowly or fidgety/restless 0 0 0  Suicidal thoughts 0 0 0  PHQ-9 Score 10 5 7   Difficult doing work/chores Not difficult at all Not difficult at all Not difficult at all    Social History   Tobacco Use   Smoking status: Every Day    Packs/day: 1.50    Years: 20.00    Pack years: 30.00    Types: Cigarettes   Smokeless tobacco: Never  Vaping Use   Vaping Use: Former  Substance Use Topics   Alcohol use: No   Drug use: Yes    Types: Marijuana    Comment: last smoked 2 days ago  8/4    Review of Systems Per HPI unless specifically indicated above     Objective:    There were no vitals taken for this visit.  Wt Readings from Last 3 Encounters:  03/04/21 231 lb (104.8 kg)  02/04/21 255 lb (115.7 kg)  11/28/20 255 lb (115.7 kg)    Physical Exam Vitals and nursing note reviewed.  Constitutional:      General: She is  not in acute distress.    Appearance: Normal appearance. She is well-developed. She is obese. She is not diaphoretic.     Comments: Well-appearing, comfortable, cooperative  HENT:     Head: Normocephalic and atraumatic.  Eyes:     General:        Right eye: No discharge.        Left eye: No discharge.     Conjunctiva/sclera: Conjunctivae normal.  Cardiovascular:     Rate and Rhythm: Normal rate.  Pulmonary:     Effort: Pulmonary effort is normal.  Abdominal:     General: Bowel sounds are increased.     Palpations: Abdomen is soft.     Tenderness: There is generalized abdominal tenderness.  Skin:    General: Skin is warm and dry.     Findings: No erythema or rash.  Neurological:     Mental Status: She is alert and oriented to person, place, and time.  Psychiatric:        Mood and Affect: Mood normal.        Behavior: Behavior normal.        Thought Content: Thought content normal.  Comments: Well groomed, good eye contact, normal speech and thoughts     I have personally reviewed the radiology report from 03/04/21 CT Abdomen.   CT Abdomen Pelvis W ContrastPerformed 02/21/2021 Final result  Study Result CLINICAL DATA: Ventral hernia and abdominal pain sent for preoperative surgical planning.  EXAM: CT ABDOMEN AND PELVIS WITH CONTRAST  TECHNIQUE: Multidetector CT imaging of the abdomen and pelvis was performed using the standard protocol following bolus administration of intravenous contrast.  RADIATION DOSE REDUCTION: This exam was performed according to the departmental dose-optimization program which includes automated exposure control, adjustment of the mA and/or kV according to patient size and/or use of iterative reconstruction technique.  CONTRAST: 153mL OMNIPAQUE IOHEXOL 300 MG/ML SOLN  COMPARISON: September 23, 2013  FINDINGS: Lower chest: No acute abnormality.  Hepatobiliary: No focal liver abnormality is seen. No gallstones, gallbladder wall  thickening, or biliary dilatation.  Pancreas: Unremarkable. No pancreatic ductal dilatation or surrounding inflammatory changes.  Spleen: Normal in size without focal abnormality.  Adrenals/Urinary Tract: Adrenal glands are unremarkable. Kidneys are normal, without renal calculi, focal lesion, or hydronephrosis. Bladder is unremarkable.  Stomach/Bowel: Stomach is within normal limits. The appendix is poorly visualized. No evidence of bowel wall thickening, distention, or inflammatory changes.  Vascular/Lymphatic: Aortic atherosclerosis. No enlarged abdominal or pelvic lymph nodes.  Reproductive: Status post hysterectomy. No adnexal masses.  Other: A 3.0 cm x 7.2 cm ventral hernia is noted along the midline of the lower abdominal, with a 5.8 cm x 1.8 cm component seen along the midline of the pelvic wall. This area measures approximately 13.2 cm in length (sagittal reformatted image 75, CT series 6).  No abdominopelvic ascites.  Musculoskeletal: Bilateral metallic density pedicle screws are seen at the levels of L4 and L5, with operative material noted within the L4-L5 intervertebral disc space.  IMPRESSION: 1. Ventral hernia along the midline of the lower abdominal and pelvic walls, as described above. 2. Postoperative changes within the lower lumbar spine. 3. Aortic atherosclerosis.  Aortic Atherosclerosis (ICD10-I70.0).   Electronically Signed By: Virgina Norfolk M.D. On: 02/22/2021 20:52   This result contains additional information. Click to view the full report. Notes recorded by [facility] on 02/26/2021 at 4:02 PM EST 02/26/21 CT scan images viewed and reviewed. Patient has two areas of potential hernia defects along the midline, in the umbilicus and just inferior to it. No evidence of strangulation. Results released to patient via MyChart. Patient has follow up with me in two months to check on her diabetes and smoking cessation. Olean Ree, MD         Results for orders placed or performed during the hospital encounter of 02/21/21  I-STAT creatinine  Result Value Ref Range   Creatinine, Ser 0.70 0.44 - 1.00 mg/dL      Assessment & Plan:   Problem List Items Addressed This Visit   None Visit Diagnoses     Umbilical hernia with obstruction, without gangrene    -  Primary   Abdominal cramping       Relevant Medications   dicyclomine (BENTYL) 20 MG tablet   Oral thrush       Relevant Medications   fluconazole (DIFLUCAN) 150 MG tablet        Glucerna protein/meal supplement for diabetic patients, low sugar.  Rx - bentyl dicyclomine 20mg  as needed for abdominal cramping.  Keep up with Dr Hampton Abbot for upcoming surgery evaluation ventral hernia  Re-schedule w Dr Gabriel Carina for A1c  Quit smoking  Rx Diflucan for  yeast oral thrush, daily dose x 1 week  Meds ordered this encounter  Medications   dicyclomine (BENTYL) 20 MG tablet    Sig: Take 1 tablet (20 mg total) by mouth 4 (four) times daily -  before meals and at bedtime. As needed for abdominal pain cramping    Dispense:  60 tablet    Refill:  2   fluconazole (DIFLUCAN) 150 MG tablet    Sig: Take 1 tablet (150 mg total) by mouth daily.    Dispense:  7 tablet    Refill:  1    No orders of the defined types were placed in this encounter.    Follow up plan: Return if symptoms worsen or fail to improve.   Nobie Putnam, [facility] 03/04/2021, 2:45 PM

## 2021-03-04 NOTE — Progress Notes (Addendum)
HPI:  Patient presents to clinic today for their subsequent annual Medicare wellness exam.  Past Medical History:  Diagnosis Date   Anxiety    Cancer ([place])    a spot on liver and treated    Complication of anesthesia    restless,easily upset   COPD (chronic obstructive pulmonary disease) ([place])    Depression    Diverticulitis    Fatty liver    GERD (gastroesophageal reflux disease)    Headache(784.0)    migraines   History of kidney stones    Hyperlipidemia    Hypertension    Pneumonia    Restless    Stroke [facility])     Current Outpatient Medications  Medication Sig Dispense Refill   albuterol (VENTOLIN HFA) 108 (90 Base) MCG/ACT inhaler INHALE 2 PUFFS INTO THE LUNGS EVERY 4 HOURS AS NEEDED FOR WHEEZING OR SHORTNESS OF BREATH 6.7 g 5   atorvastatin (LIPITOR) 40 MG tablet Take 1 tablet (40 mg total) by mouth at bedtime. 90 tablet 3   buPROPion (WELLBUTRIN XL) 150 MG 24 hr tablet TAKE 1 TABLET(150 MG) BY MOUTH DAILY 90 tablet 1   citalopram (CELEXA) 40 MG tablet TAKE 1 TABLET(40 MG) BY MOUTH DAILY 90 tablet 0   clopidogrel (PLAVIX) 75 MG tablet Take 1 tablet (75 mg total) by mouth daily. 90 tablet 3   gabapentin (NEURONTIN) 600 MG tablet Take 600 mg by mouth 4 (four) times daily.     linagliptin (TRADJENTA) 5 MG TABS tablet Take 5 mg by mouth daily.     metFORMIN (GLUCOPHAGE) 1000 MG tablet Take 1 tablet (1,000 mg total) by mouth 2 (two) times daily with a meal. 180 tablet 1   metoCLOPramide (REGLAN) 5 MG tablet Take 1 tablet (5 mg total) by mouth 3 (three) times daily before meals. (Patient taking differently: Take 5 mg by mouth 3 (three) times daily as needed.) 90 tablet 2   nystatin (MYCOSTATIN) 100000 UNIT/ML suspension Take 5 mLs (500,000 Units total) by mouth 4 (four) times daily. For 7-10 days 60 mL 0   dicyclomine (BENTYL) 20 MG tablet Take 1 tablet (20 mg total) by mouth 4 (four) times daily -  before meals and at bedtime. As needed for abdominal pain cramping 60 tablet 2    fluconazole (DIFLUCAN) 150 MG tablet Take 1 tablet (150 mg total) by mouth daily. 7 tablet 1   NYSTATIN powder APPLY TOPICALLY A SMALL AMOUNT TWICE DAILY AS NEEDED 60 g 2   ondansetron (ZOFRAN-ODT) 4 MG disintegrating tablet Take 1 tablet (4 mg total) by mouth every 8 (eight) hours as needed for nausea or vomiting. 30 tablet 2   oxyCODONE (ROXICODONE) 15 MG immediate release tablet Take 15 mg by mouth every 6 (six) hours as needed.     pantoprazole (PROTONIX) 40 MG tablet Take 1 tablet (40 mg total) by mouth daily before breakfast. 90 tablet 3   pioglitazone (ACTOS) 45 MG tablet Take 45 mg by mouth daily.     topiramate (TOPAMAX) 100 MG tablet TAKE 1 TABLET(100 MG) BY MOUTH TWICE DAILY 180 tablet 1   traZODone (DESYREL) 100 MG tablet Start with half tablet for dose 50mg  nightly at bedtime for 2-4 weeks, then increase to 1 whole tab (100mg ) dose nightly 30 tablet 2   TRELEGY ELLIPTA 100-62.5-25 MCG/INH AEPB Inhale 1 puff into the lungs daily. 60 each 11   Vitamin D, Ergocalciferol, (DRISDOL) 50000 units CAPS capsule Take 50,000 Units by mouth every 7 (seven) days. On Wednesday  zolpidem (AMBIEN) 5 MG tablet Take 1 tablet (5 mg total) by mouth at bedtime as needed for sleep. 15 tablet 1   No current facility-administered medications for this visit.    Allergies  Allergen Reactions   Tramadol Nausea And Vomiting and Hypertension   Augmentin [Amoxicillin-Pot Clavulanate] Rash    Family History  Problem Relation Age of Onset   Diabetes Mother    Hypertension Mother    Hypertension Father     Social History   Socioeconomic History   Marital status: Married    Spouse name: Not on file   Number of children: Not on file   Years of education: Not on file   Highest education level: Not on file  Occupational History   Not on file  Tobacco Use   Smoking status: Every Day    Packs/day: 1.50    Years: 20.00    Pack years: 30.00    Types: Cigarettes   Smokeless tobacco: Never  Vaping  Use   Vaping Use: Former  Substance and Sexual Activity   Alcohol use: No   Drug use: Yes    Types: Marijuana    Comment: last smoked 2 days ago  8/4   Sexual activity: Not on file  Other Topics Concern   Not on file  Social History Narrative   Not on file   Social Determinants of Health   Financial Resource Strain: Medium Risk   Difficulty of Paying Living Expenses: Somewhat hard  Food Insecurity: No Food Insecurity   Worried About Charity fundraiser in the Last Year: Never true   Ran Out of Food in the Last Year: Never true  Transportation Needs: No Transportation Needs   Lack of Transportation (Medical): No   Lack of Transportation (Non-Medical): No  Physical Activity: Not on file  Stress: Stress Concern Present   Feeling of Stress : To some extent  Social Connections: Moderately Isolated   Frequency of Communication with Friends and Family: More than three times a week   Frequency of Social Gatherings with Friends and Family: More than three times a week   Attends Religious Services: Never   Marine scientist or Organizations: No   Attends Archivist Meetings: Never   Marital Status: Married  Human resources officer Violence: Not on file    Hospitiliaztions: No hospitalization in the past 12 mths.   Health Maintenance:   Flu: 11/28/2020 Tetanus: Pt notified that she can get at her pharmacy.  Pneumovax: 10/13/2012  Shingrix: Pt notified that she can get at her pharmacy.  Covid:  Mammogram: No mammograms, Order placed   Colon Screening: pt declined  Eye Doctor: Recommended annual eye exam  Dental Exam: Recommended annual dental exam    Providers:   PCP: Nobie Putnam, DO  Endocrinologist: Lucilla Lame, MD   I have personally reviewed and have noted:  1. The patient's medical and social history 2. Their use of alcohol, tobacco or illicit drugs 3. Their current medications and supplements 4. The patient's functional ability including ADL's,  fall risks, home safety risks and hearing or visual impairment. 5. Diet and physical activities 6. Evidence for depression or mood disorder  Objective:  PE:   BP (!) 101/58 (BP Location: Left Arm, Patient Position: Sitting, Cuff Size: Normal)    Pulse 79    Temp (!) 97.1 F (36.2 C) (Temporal)    Resp 18    Ht 5\' 6"  (1.676 m)    Wt 231 lb (104.8 kg)  SpO2 99%    BMI 37.28 kg/m  Wt Readings from Last 3 Encounters:  03/04/21 231 lb (104.8 kg)  02/04/21 255 lb (115.7 kg)  11/28/20 255 lb (115.7 kg)     BMET    Component Value Date/Time   NA 133 (L) 09/23/2017 1627   NA 135 (L) 09/22/2013 2251   K 4.0 09/23/2017 1627   K 3.8 09/22/2013 2251   CL 98 09/23/2017 1627   CL 100 09/22/2013 2251   CO2 27 09/23/2017 1627   CO2 27 09/22/2013 2251   GLUCOSE 306 (H) 09/23/2017 1627   GLUCOSE 335 (H) 09/22/2013 2251   BUN 11 09/23/2017 1627   BUN 11 09/22/2013 2251   CREATININE 0.70 02/21/2021 1456   CREATININE 1.05 09/22/2013 2251   CALCIUM 9.2 09/23/2017 1627   CALCIUM 8.3 (L) 09/22/2013 2251   GFRNONAA >60 08/18/2017 0707   GFRNONAA >60 09/22/2013 2251   GFRAA >60 08/18/2017 0707   GFRAA >60 09/22/2013 2251    Lipid Panel     Component Value Date/Time   CHOL 136 04/07/2017 0828   CHOL 207 (H) 10/07/2012 0423   TRIG 191 (H) 04/07/2017 0828   TRIG 273 (H) 10/07/2012 0423   HDL 31 (L) 04/07/2017 0828   HDL 25 (L) 10/07/2012 0423   CHOLHDL 4.4 04/07/2017 0828   VLDL 38 04/07/2017 0828   VLDL 55 (H) 10/07/2012 0423   LDLCALC 67 04/07/2017 0828   LDLCALC 127 (H) 10/07/2012 0423    CBC    Component Value Date/Time   WBC 9.5 08/18/2017 0707   RBC 4.94 08/18/2017 0707   HGB 15.3 (H) 08/18/2017 1319   HGB 14.6 09/22/2013 2251   HCT 45.0 08/18/2017 1319   HCT 44.3 09/22/2013 2251   PLT 316 08/18/2017 0707   PLT 300 09/22/2013 2251   MCV 89.5 08/18/2017 0707   MCV 90 09/22/2013 2251   MCH 28.5 08/18/2017 0707   MCHC 31.9 08/18/2017 0707   RDW 13.8 08/18/2017 0707    RDW 13.8 09/22/2013 2251   LYMPHSABS 2.1 08/18/2017 0707   LYMPHSABS 4.5 (H) 08/02/2013 2118   MONOABS 0.6 08/18/2017 0707   MONOABS 1.3 (H) 08/02/2013 2118   EOSABS 0.2 08/18/2017 0707   EOSABS 0.1 08/02/2013 2118   BASOSABS 0.1 08/18/2017 0707   BASOSABS 0.0 08/02/2013 2118    Hgb A1C Lab Results  Component Value Date   HGBA1C 9.2 (H) 08/09/2017      Assessment and Plan:   Medicare Annual Wellness Visit:  Diet: DM if diabetic Physical activity: Sedentary Depression/mood screen: Negative,  Flowsheet Row Clinical Support from 03/04/2021 in [place]  PHQ-9 Total Score 10      Hearing: Intact to whispered voice Visual acuity: Grossly normal, performs annual eye exam  ADLs: Capable Fall risk: None Home safety: Good Cognitive evaluation:  6CIT Screen 03/04/2021  What Year? 0 points  What month? 0 points  What time? 0 points  Count back from 20 0 points  Months in reverse 0 points  Repeat phrase 0 points  Total Score 0  A1C- 12.5% 77mths ago Goals:  A1C less than 7% EOL planning: No Adv directives, full code/ I agree  Nurses Note : Patient will need 25 modifier   Next appointment: f/u 1 yr for medicare wellness visit.   Wilson Singer, CMA

## 2021-03-04 NOTE — Patient Instructions (Addendum)
Thank you for coming to the office today.  Glucerna protein/meal supplement for diabetic patients, low sugar.  Use bentyl dicyclomine as needed for abdominal cramping.  Keep up with Dr Hampton Abbot for upcoming surgery  Re-schedule w Dr Gabriel Carina for A1c  Diflucan for yeast oral thrush  Please schedule a Follow-up Appointment to: Return if symptoms worsen or fail to improve.  If you have any other questions or concerns, please feel free to call the office or send a message through [facility]. You may also schedule an earlier appointment if necessary.  Additionally, you may be receiving a survey about your experience at our office within a few days to 1 week by e-mail or mail. We value your feedback.  Nobie Putnam, DO [place]

## 2021-03-05 LAB — MICROALBUMIN, URINE: Microalb, Ur: 3.4 mg/dL

## 2021-03-07 NOTE — Addendum Note (Signed)
Addended by: Wilson Singer on: 03/07/2021 04:11 PM   Modules accepted: Level of Service

## 2021-03-11 DIAGNOSIS — M25561 Pain in right knee: Secondary | ICD-10-CM | POA: Diagnosis not present

## 2021-03-11 DIAGNOSIS — Z720 Tobacco use: Secondary | ICD-10-CM | POA: Diagnosis not present

## 2021-03-11 DIAGNOSIS — M961 Postlaminectomy syndrome, not elsewhere classified: Secondary | ICD-10-CM | POA: Diagnosis not present

## 2021-03-11 DIAGNOSIS — I69351 Hemiplegia and hemiparesis following cerebral infarction affecting right dominant side: Secondary | ICD-10-CM | POA: Diagnosis not present

## 2021-03-11 DIAGNOSIS — Z87891 Personal history of nicotine dependence: Secondary | ICD-10-CM | POA: Diagnosis not present

## 2021-03-11 DIAGNOSIS — Z79891 Long term (current) use of opiate analgesic: Secondary | ICD-10-CM | POA: Diagnosis not present

## 2021-03-11 DIAGNOSIS — M545 Low back pain, unspecified: Secondary | ICD-10-CM | POA: Diagnosis not present

## 2021-03-11 DIAGNOSIS — Z029 Encounter for administrative examinations, unspecified: Secondary | ICD-10-CM | POA: Diagnosis not present

## 2021-03-13 ENCOUNTER — Ambulatory Visit (INDEPENDENT_AMBULATORY_CARE_PROVIDER_SITE_OTHER): Payer: Medicare Other | Admitting: Pharmacist

## 2021-03-13 DIAGNOSIS — Z794 Long term (current) use of insulin: Secondary | ICD-10-CM

## 2021-03-13 DIAGNOSIS — E1169 Type 2 diabetes mellitus with other specified complication: Secondary | ICD-10-CM

## 2021-03-13 NOTE — Chronic Care Management (AMB) (Signed)
Chronic Care Management CCM Pharmacy Note  03/13/2021 Name:  [name] MRN:  [id number] DOB:  08/11/1965   Subjective: [name] is an 56 y.o. year old female who is a primary patient of Olin Hauser, DO.  The CCM team was consulted for assistance with disease management and care coordination needs.    Engaged with patient by telephone for follow up visit for pharmacy case management and/or care coordination services.   Objective:  Medications Reviewed Today     Reviewed by Rennis Petty, RPH-CPP (Pharmacist) on 03/13/21 at 1700  Med List Status: <None>   Medication Order Taking? Sig Documenting Provider Last Dose Status Informant  albuterol (VENTOLIN HFA) 108 (90 Base) MCG/ACT inhaler [id number]  INHALE 2 PUFFS INTO THE LUNGS EVERY 4 HOURS AS NEEDED FOR WHEEZING OR SHORTNESS OF BREATH Karamalegos, Devonne Doughty, DO  Active   atorvastatin (LIPITOR) 40 MG tablet [id number]  Take 1 tablet (40 mg total) by mouth at bedtime. Olin Hauser, DO  Active   buPROPion (WELLBUTRIN XL) 150 MG 24 hr tablet [id number]  TAKE 1 TABLET(150 MG) BY MOUTH DAILY Olin Hauser, DO  Active   citalopram (CELEXA) 40 MG tablet [id number]  TAKE 1 TABLET(40 MG) BY MOUTH DAILY Parks Ranger, Devonne Doughty, DO  Active   clopidogrel (PLAVIX) 75 MG tablet [id number]  Take 1 tablet (75 mg total) by mouth daily. Karamalegos, Devonne Doughty, DO  Active   dicyclomine (BENTYL) 20 MG tablet [id number]  Take 1 tablet (20 mg total) by mouth 4 (four) times daily -  before meals and at bedtime. As needed for abdominal pain cramping Olin Hauser, DO  Active   fluconazole (DIFLUCAN) 150 MG tablet [id number]  Take 1 tablet (150 mg total) by mouth daily. Karamalegos, Devonne Doughty, DO  Active   gabapentin (NEURONTIN) 600 MG tablet [id number]  Take 600 mg by mouth 4 (four) times daily. [provider]  Active   linagliptin (TRADJENTA) 5 MG TABS tablet [id number] Yes Take 5 mg by mouth  daily. [provider] Taking Active Pharmacy Records  metFORMIN (GLUCOPHAGE) 1000 MG tablet [id number] Yes Take 1 tablet (1,000 mg total) by mouth 2 (two) times daily with a meal. Parks Ranger, Devonne Doughty, DO Taking Active   metoCLOPramide (REGLAN) 5 MG tablet [id number]  Take 1 tablet (5 mg total) by mouth 3 (three) times daily before meals.  Patient taking differently: Take 5 mg by mouth 3 (three) times daily as needed.   Karamalegos, Devonne Doughty, DO  Active   nystatin (MYCOSTATIN) 100000 UNIT/ML suspension [id number]  Take 5 mLs (500,000 Units total) by mouth 4 (four) times daily. For 7-10 days Andee Poles  Active   NYSTATIN powder [id number]  APPLY TOPICALLY A SMALL AMOUNT TWICE DAILY AS NEEDED Parks Ranger, Devonne Doughty, DO  Active   ondansetron (ZOFRAN-ODT) 4 MG disintegrating tablet [id number]  Take 1 tablet (4 mg total) by mouth every 8 (eight) hours as needed for nausea or vomiting. Karamalegos, Devonne Doughty, DO  Active   oxyCODONE (ROXICODONE) 15 MG immediate release tablet [id number]  Take 15 mg by mouth every 6 (six) hours as needed. [provider]  Active   pantoprazole (PROTONIX) 40 MG tablet [id number]  Take 1 tablet (40 mg total) by mouth daily before breakfast. Olin Hauser, DO  Active   pioglitazone (ACTOS) 45 MG tablet [id number] Yes Take 45 mg by mouth daily. [provider] Taking Active Pharmacy Records  topiramate (TOPAMAX) 100 MG tablet [id number]  TAKE 1 TABLET(100 MG) BY MOUTH TWICE DAILY Olin Hauser, DO  Active   traZODone (DESYREL) 100 MG tablet [id number]  Start with half tablet for dose 50mg  nightly at bedtime for 2-4 weeks, then increase to 1 whole tab (100mg ) dose nightly Olin Hauser, DO  Active   TRELEGY ELLIPTA 100-62.5-25 MCG/INH AEPB [id number] Yes Inhale 1 puff into the lungs daily. Olin Hauser, DO Taking Active   Vitamin D, Ergocalciferol, (DRISDOL) 50000 units CAPS capsule [id number]   Take 50,000 Units by mouth every 7 (seven) days. On Wednesday [provider]  Active   zolpidem (AMBIEN) 5 MG tablet [id number]  Take 1 tablet (5 mg total) by mouth at bedtime as needed for sleep. Olin Hauser, DO  Active             Pertinent Labs:  Lab Results  Component Value Date   HGBA1C 9.2 (H) 08/09/2017  Latest A1C per shared record from [place]: 12.5% on 04/23/2020    SDOH:  (Social Determinants of Health) assessments and interventions performed:    [employer]  Review of patient past medical history, allergies, medications, health status, including review of consultants reports, laboratory and other test data, was performed as part of comprehensive evaluation and provision of chronic care management services.   Care Plan : PharmD - Medication Adherence/T2DM Management  Updates made by Rennis Petty, RPH-CPP since 03/13/2021 12:00 AM     Problem: Disease Progression      Long-Range Goal: Disease Progression Prevented or Minimized   Start Date: 08/22/2020  Expected End Date: 11/20/2020  Recent Progress: On track  Priority: High  Note:   Current Barriers:  Unable to achieve control of A1C  Lack of blood sugar results for clinical team  Pharmacist Clinical Goal(s):  Over the next 90 days, patient will achieve adherence to monitoring guidelines and medication adherence to achieve therapeutic efficacy through collaboration with PharmD and provider.    Interventions: 1:1 collaboration with Olin Hauser, DO regarding development and update of comprehensive plan of care as evidenced by provider attestation and co-signature Inter-disciplinary care team collaboration (see longitudinal plan of care) Perform chart review.  Patient seen for Office Visit with PCP on 03/04/2021 for abdominal pain. Provider advised patient: Try Glucerna protein/meal supplement for diabetic patients, low sugar Keep up with Dr Hampton Abbot  for upcoming surgery Re-schedule w Dr Gabriel Carina for A1c Take Diflucan as prescribed for yeast oral thrush  Type 2 Diabetes: Uncontrolled; current treatment: Metformin 1000 mg twice daily Tradjenta 5 mg daily Pioglitazone 45 mg daily Today reports she self-discontinued Tresiba (previous dose from Endocrinologist: 14 units daily) ~ 2 months ago. States she stopped [place] because she attributed it to having headaches and says "I didn't want to own up to the fact that I have diabetes Recalls recent blood sugars ranging 200s-400s 2/28 before breakfast: 274 2/27 before breakfast: 260 Denies symptoms of hypoglycemia Current dietary patterns: breakfast: bacon egg and cheese McMuffin with diet Pepsi; lunch: skips; supper: Chili beans and hamburger with crackers; drinks: Diet Pepsi; snacks: peanut butter crackers before supper, cups of apple sauce and Graham crackers Encourage to have regular well-balanced meals throughout the day, while controlling carbohydrate portion sizes Patient reports will try having Glucerna or similar meal supplement for diabetic patients at lunch time Will send patient handout on healthy meal planning via MyChart as requested Current exercise: limited by right side deficit due to stroke and previous injury to right ankle from  a fall Reports has lost weight; reports weight last week: 233 lbs Reports has now scheduled appointment to return to [facility] Endocr[facility]ology for: 04/16/2021 Collaborate with PCP Advise patient to restart Tresiba at dose of 10 units daily Patient verbalizes understanding via teach back Patient to monitor fasting blood sugars, keep a log of results and have this record to review in 1 week Counsel patient on s/s of low blood sugar and how to treat lows Review rules of 15s - importance of using 15 grams of sugar to treat low, recheck blood sugar in 15 minutes, treat again if remains low or, if back to normal, having meal if mealtime or snack  Review  examples of sources of 15 grams of sugar Encourage to pick up glucose tablets  Medication Adherence: Reports medication adherence improved since started using weekly pillbox   Hyperlipidemia: Current treatment: atorvastatin 40 mg daily  Chronic Obstructive Pulmonary Disease: Current treatment: Trelegy - 1 puff daily Albuterol as needed Confirms using Trelegy daily consistently and rinsing out mouth after each use  Tobacco Use: Reports has reduced smoking to 5 cigarettes/week Motivation: Wanting to have abdominal surgery, quality of life, playing with grandkids Reports has noticed some improvement in her breathing since reduced smoking Planning to use nicotine patches and gum when ready to set quite date Counsel on nicotine replacement administration Encourage patient to contact [place] [employer] for support   Patient Goals/Self-Care Activities Over the next 90 days, patient will:  - focus on medication adherence by using weekly pillbox - check glucose, document, and provide at future appointments - attend medical appointment as scheduled  Follow Up Plan: Telephone follow up appointment with care management team member scheduled for: 03/20/2021 at 1:45 PM      Wallace Cullens, PharmD, Para [place], [facility] [place] [phone]

## 2021-03-13 NOTE — Patient Instructions (Signed)
Visit Information ? ?Thank you for taking time to visit with me today. Please don't hesitate to contact me if I can be of assistance to you before our next scheduled telephone appointment. ? ?Following are the goals we discussed today:  ? Goals Addressed   ? ?  ?  ?  ?  ? This Visit's Progress  ?  Pharmacy Goals     ?  Our goal A1c is less than 7%. This corresponds with fasting sugars less than 130 and 2 hour after meal sugars less than 180. Please check your blood sugar, keep a log of the results and have this for [place] to review during our next telephone appointment ? ?Our goal bad cholesterol, or LDL, is less than 70 . This is why it is important to continue taking your atorvastatin ? ?Feel free to call me with any questions or concerns. I look forward to our next call! ? ?Wallace Cullens, PharmD, BCACP, CPP ?Clinical Pharmacist ?[facility] ?[place] ?[phone] ?  ? ?  ? ? ? ?Our next appointment is by telephone on 03/20/2021 at 1:45 PM ? ?Please call the care guide team at [phone] if you need to cancel or reschedule your appointment.  ? ? ?Patient verbalizes understanding of instructions and care plan provided today and agrees to view in [place]. Active MyChart status confirmed with patient.   ? ?

## 2021-03-15 ENCOUNTER — Other Ambulatory Visit: Payer: Self-pay | Admitting: Family Medicine

## 2021-03-15 DIAGNOSIS — L97511 Non-pressure chronic ulcer of other part of right foot limited to breakdown of skin: Secondary | ICD-10-CM

## 2021-03-15 DIAGNOSIS — E11621 Type 2 diabetes mellitus with foot ulcer: Secondary | ICD-10-CM

## 2021-03-15 NOTE — Telephone Encounter (Signed)
Requested medication (s) are due for refill today: No, not on current med list ? ?Requested medication (s) are on the active medication list: NO ? ?Last refill:  09/21/20 ? ?Future visit scheduled: no ? ?Notes to clinic:  This med is not on current med list, there is no protocol to follow, please assess. ? ?  ? ?Requested Prescriptions  ?Pending Prescriptions Disp Refills  ? mupirocin ointment (BACTROBAN) 2 % [Pharmacy Med Name: MUPIROCIN 2% OINTMENT 22GM] 22 g 0  ?  Sig: APPLY TOPICALLY TO THE AFFECTED AREA TWICE DAILY FOR UP TO 7 TO 10 DAYS AS NEEDED FOR SKIN INFECTION  ?  ? Off-Protocol Failed - 03/15/2021  5:00 PM  ?  ?  Failed - Medication not assigned to a protocol, review manually.  ?  ?  Passed - Valid encounter within last 12 months  ?  Recent Outpatient Visits   ? ?      ? 1 week ago Umbilical hernia with obstruction, without gangrene  ? [place], DO  ? 1 month ago Type 2 diabetes mellitus with other specified complication, with long-term current use of insulin ([place])  ? [place], DO  ? 3 months ago Psychophysiological insomnia  ? [facility], Devonne Doughty, DO  ? 4 months ago Diabetic ulcer of toe of right foot associated with type 2 diabetes mellitus, limited to breakdown of skin ([place])  ? [place], DO  ? 5 months ago Diabetic ulcer of toe of right foot associated with type 2 diabetes mellitus, limited to breakdown of skin ([place])  ? [place], DO  ? ?  ?  ? ?  ?  ?  ? ? ?

## 2021-03-20 ENCOUNTER — Telehealth: Payer: Self-pay | Admitting: Pharmacist

## 2021-03-20 ENCOUNTER — Telehealth: Payer: Medicare Other

## 2021-03-20 NOTE — Telephone Encounter (Signed)
?  Chronic Care Management  ? ?Outreach Note ? ?03/20/2021 ?Name: [name] MRN: [id number] DOB: 08-31-1965 ? ?Referred by: Olin Hauser, DO ?Reason for referral : No chief complaint on file. ? ? ?Was unable to reach patient via telephone today and have left HIPAA compliant voicemail asking patient to return my call.  ? ? ?Follow Up Plan: CM Pharmacist will attempt to reach patient by telephone again in the next 14 days ? ?Wallace Cullens, PharmD, BCACP ?Clinical Pharmacist ?[facility] ?[phone] ? ?

## 2021-03-26 DIAGNOSIS — I69951 Hemiplegia and hemiparesis following unspecified cerebrovascular disease affecting right dominant side: Secondary | ICD-10-CM | POA: Diagnosis not present

## 2021-03-27 ENCOUNTER — Ambulatory Visit: Payer: Medicare Other | Admitting: Pharmacist

## 2021-03-27 DIAGNOSIS — E1169 Type 2 diabetes mellitus with other specified complication: Secondary | ICD-10-CM

## 2021-03-27 NOTE — Patient Instructions (Signed)
Visit Information ? ?Thank you for taking time to visit with me today. Please don't hesitate to contact me if I can be of assistance to you before our next scheduled telephone appointment. ? ?Following are the goals we discussed today:  ? Goals Addressed   ? ?  ?  ?  ?  ? This Visit's Progress  ?  Pharmacy Goals     ?  Our goal A1c is less than 7%. This corresponds with fasting sugars less than 130 and 2 hour after meal sugars less than 180. Please check your blood sugar, keep a log of the results and have this for [place] to review during our next telephone appointment ? ?Our goal bad cholesterol, or LDL, is less than 70 . This is why it is important to continue taking your atorvastatin ? ?Feel free to call me with any questions or concerns. I look forward to our next call! ? ? ?Wallace Cullens, PharmD, BCACP, CPP ?Clinical Pharmacist ?[facility] ?[place] ?[phone] ?  ? ?  ? ? ? ?Our next appointment is by telephone on 3/29 at 10 am ? ?Please call the care guide team at [phone] if you need to cancel or reschedule your appointment.  ? ? ?Patient verbalizes understanding of instructions and care plan provided today and agrees to view in [place]. Active MyChart status confirmed with patient.   ? ?

## 2021-03-27 NOTE — Chronic Care Management (AMB) (Signed)
? ?Chronic Care Management ?CCM Pharmacy Note ? ?03/27/2021 ?Name:  [name] MRN:  [id number] DOB:  1965/03/26 ? ? ?Subjective: ?[name] is an 56 y.o. year old female who is a primary patient of Olin Hauser, DO.  The CCM team was consulted for assistance with disease management and care coordination needs.   ? ?Engaged with patient by telephone for follow up visit for pharmacy case management and/or care coordination services.  ? ?Objective: ? ?Medications Reviewed Today   ? ? Reviewed by Rennis Petty, RPH-CPP (Pharmacist) on 03/13/21 at 1700  Med List Status: <None>  ? ?Medication Order Taking? Sig Documenting Provider Last Dose Status Informant  ?albuterol (VENTOLIN HFA) 108 (90 Base) MCG/ACT inhaler [id number]  INHALE 2 PUFFS INTO THE LUNGS EVERY 4 HOURS AS NEEDED FOR WHEEZING OR SHORTNESS OF BREATH Karamalegos, Devonne Doughty, DO  Active   ?atorvastatin (LIPITOR) 40 MG tablet [id number]  Take 1 tablet (40 mg total) by mouth at bedtime. Olin Hauser, DO  Active   ?buPROPion (WELLBUTRIN XL) 150 MG 24 hr tablet [id number]  TAKE 1 TABLET(150 MG) BY MOUTH DAILY [place] Ranger, Devonne Doughty, DO  Active   ?citalopram (CELEXA) 40 MG tablet [id number]  TAKE 1 TABLET(40 MG) BY MOUTH DAILY Parks Ranger, Devonne Doughty, DO  Active   ?clopidogrel (PLAVIX) 75 MG tablet [id number]  Take 1 tablet (75 mg total) by mouth daily. Olin Hauser, DO  Active   ?dicyclomine (BENTYL) 20 MG tablet [id number]  Take 1 tablet (20 mg total) by mouth 4 (four) times daily -  before meals and at bedtime. As needed for abdominal pain cramping Olin Hauser, DO  Active   ?fluconazole (DIFLUCAN) 150 MG tablet [id number]  Take 1 tablet (150 mg total) by mouth daily. Olin Hauser, DO  Active   ?gabapentin (NEURONTIN) 600 MG tablet [id number]  Take 600 mg by mouth 4 (four) times daily. [provider]  Active   ?linagliptin (TRADJENTA) 5 MG TABS tablet [id number] Yes Take 5 mg by mouth  daily. [provider] Taking Active Pharmacy Records  ?metFORMIN (GLUCOPHAGE) 1000 MG tablet [id number] Yes Take 1 tablet (1,000 mg total) by mouth 2 (two) times daily with a meal. Parks Ranger, Devonne Doughty, DO Taking Active   ?metoCLOPramide (REGLAN) 5 MG tablet [id number]  Take 1 tablet (5 mg total) by mouth 3 (three) times daily before meals.  ?Patient taking differently: Take 5 mg by mouth 3 (three) times daily as needed.  ? Olin Hauser, DO  Active   ?nystatin (MYCOSTATIN) 100000 UNIT/ML suspension [id number]  Take 5 mLs (500,000 Units total) by mouth 4 (four) times daily. For 7-10 days Olin Hauser, DO  Active   ?NYSTATIN powder [id number]  APPLY TOPICALLY A SMALL AMOUNT TWICE DAILY AS NEEDED Parks Ranger, Devonne Doughty, DO  Active   ?ondansetron (ZOFRAN-ODT) 4 MG disintegrating tablet [id number]  Take 1 tablet (4 mg total) by mouth every 8 (eight) hours as needed for nausea or vomiting. Olin Hauser, DO  Active   ?oxyCODONE (ROXICODONE) 15 MG immediate release tablet [id number]  Take 15 mg by mouth every 6 (six) hours as needed. [provider]  Active   ?pantoprazole (PROTONIX) 40 MG tablet [id number]  Take 1 tablet (40 mg total) by mouth daily before breakfast. Olin Hauser, DO  Active   ?pioglitazone (ACTOS) 45 MG tablet [id number] Yes Take 45 mg by mouth daily. [provider] Taking Active Pharmacy Records  ?topiramate (TOPAMAX) 100 MG tablet [id number]  TAKE 1 TABLET(100 MG) BY MOUTH TWICE DAILY Parks Ranger, Devonne Doughty, DO  Active   ?traZODone (DESYREL) 100 MG tablet [id number]  Start with half tablet for dose '50mg'$  nightly at bedtime for 2-4 weeks, then increase to 1 whole tab ('100mg'$ ) dose nightly Olin Hauser, DO  Active   ?TRELEGY ELLIPTA 100-62.5-25 MCG/INH AEPB [id number] Yes Inhale 1 puff into the lungs daily. Olin Hauser, DO Taking Active   ?Vitamin D, Ergocalciferol, (DRISDOL) 50000 units CAPS capsule [id number]   Take 50,000 Units by mouth every 7 (seven) days. On Wednesday [provider]  Active   ?zolpidem (AMBIEN) 5 MG tablet [id number]  Take 1 tablet (5 mg total) by mouth at bedtime as needed for sleep. Olin Hauser, DO  Active   ? ?  ?  ? ?  ? ? ?Pertinent Labs:  ?Lab Results  ?Component Value Date  ? HGBA1C 9.2 (H) 08/09/2017  ? ?Lab Results  ?Component Value Date  ? CHOL 136 04/07/2017  ? HDL 31 (L) 04/07/2017  ? [place] 67 04/07/2017  ? TRIG 191 (H) 04/07/2017  ? CHOLHDL 4.4 04/07/2017  ? ?Lab Results  ?Component Value Date  ? CREATININE 0.70 02/21/2021  ? BUN 11 09/23/2017  ? NA 133 (L) 09/23/2017  ? K 4.0 09/23/2017  ? CL 98 09/23/2017  ? CO2 27 09/23/2017  ? ? ?SDOH:  (Social Determinants of Health) assessments and interventions performed:  ? ? ?CCM Care Plan ? ?Review of patient past medical history, allergies, medications, health status, including review of consultants reports, laboratory and other test data, was performed as part of comprehensive evaluation and provision of chronic care management services.  ? ?Care Plan : PharmD - Medication Adherence/T2DM Management  ?Updates made by Rennis Petty, RPH-CPP since 03/27/2021 12:00 AM  ?  ? ?Problem: Disease Progression   ?  ? ?Long-Range Goal: Disease Progression Prevented or Minimized   ?Start Date: 08/22/2020  ?Expected End Date: 11/20/2020  ?Recent Progress: On track  ?Priority: High  ?Note:   ?Current Barriers:  ?Unable to achieve control of A1C  ?Lack of blood sugar results for clinical team ? ?Pharmacist Clinical Goal(s):  ?Over the next 90 days, patient will achieve adherence to monitoring guidelines and medication adherence to achieve therapeutic efficacy through collaboration with PharmD and provider.  ? ? ?Interventions: ?1:1 collaboration with Olin Hauser, DO regarding development and update of comprehensive plan of care as evidenced by provider attestation and co-signature ?Inter-disciplinary care team  collaboration (see longitudinal plan of care) ?Today reports has continued to have abdominal cramping, nausea and occasionally vomiting ?Reports started on Relistor 450 mg once daily by pain management specialist and that this has helped with constipation ?From review of chart, note patient seen by PCP on 2/20 for abdominal pain and advised by provider: ?Start glucerna protein/meal supplement for diabetic patients, low sugar. ?Rx sent for- bentyl dicyclomine '20mg'$  as needed for abdominal cramping. ?Keep up with Dr Hampton Abbot for upcoming surgery evaluation ventral hernia ?Quit smoking ?Encourage patient to follow advice from PCP and to follow up with PCP as needed for further symptoms ?Reports no longer taking trazodone or zolpidem as did not feel like helped her sleep ? ?Type 2 Diabetes: ?Uncontrolled; current treatment: ?Metformin 1000 mg twice daily ?Tradjenta 5 mg daily ?Pioglitazone 45 mg daily ?Tresiba 10 units daily (restarted on ~3/2) ?Denies checking blood sugar recently ?Discuss importance of blood sugar monitoring ?Denies symptoms of hypoglycemia ?Encourage to have regular well-balanced meals throughout the day,  while controlling carbohydrate portion sizes ?Have encouraged patient to try Glucerna or similar meal supplement for diabetic patients as recommended by PCP ?Encourage patient to review handout on healthy meal planning sent via MyChart as requested ?Current exercise: limited by right side deficit due to stroke and previous injury to right ankle from a fall ?Reports has lost weight; last checked: 233 lbs ?Encourage patient to keep appointment to return to [facility] Endocr[facility]ology on 04/16/2021 ?Advise patient to restart monitoring fasting blood sugars, keep a log of results and have this record to review in 2 week ?Have counseled patient on s/s of low blood sugar and how to treat lows ?Review rules of 15s - importance of using 15 grams of sugar to treat low, recheck blood sugar in 15 minutes, treat  again if remains low or, if back to normal, having meal if mealtime or snack  ?Review examples of sources of 15 grams of sugar ?Reported pick up glucose tablets to carry with her ? ?Medication Adherence: ?Repo

## 2021-03-29 DIAGNOSIS — M25561 Pain in right knee: Secondary | ICD-10-CM | POA: Diagnosis not present

## 2021-03-29 DIAGNOSIS — M961 Postlaminectomy syndrome, not elsewhere classified: Secondary | ICD-10-CM | POA: Diagnosis not present

## 2021-03-29 DIAGNOSIS — G894 Chronic pain syndrome: Secondary | ICD-10-CM | POA: Diagnosis not present

## 2021-04-01 ENCOUNTER — Ambulatory Visit: Payer: Self-pay

## 2021-04-01 NOTE — Telephone Encounter (Signed)
3rd attempt, Patient called, left VM to return the call to the office to discuss symptoms with a nurse. ? ?Summary: not sleeping  ? Pt states she has discussed a sleeping med with Dr Raliegh Ip in the past.  She has tried several meds he has prescribed, and nothing has worked. Pt states this has been going on about 6 months.  ?Pt knows she isn't supposed to take others med, but her sister gave her a Seroquel and that worked, she slept!  No appts until 3/28/  was hoping for something sooner. Please advise   ?  ? ?

## 2021-04-01 NOTE — Telephone Encounter (Signed)
Second attempt. Left message to call back. ?

## 2021-04-01 NOTE — Telephone Encounter (Signed)
Patient called, left VM to return the call to the office to discuss symptoms with a nurse. ? ?Summary: not sleeping  ? Pt states she has discussed a sleeping med with Dr Raliegh Ip in the past.  She has tried several meds he has prescribed, and nothing has worked. Pt states this has been going on about 6 months.  ?Pt knows she isn't supposed to take others med, but her sister gave her a Seroquel and that worked, she slept!  No appts until 3/28/  was hoping for something sooner. Please advise   ?  ? ?

## 2021-04-02 NOTE — Telephone Encounter (Signed)
Pt called in, she stated that no one called her back yesterday. I advised that multiple nurses called back. Pt reports that she found old bottle of seroquel she had and took one and that is the only thing that has helped her sleep. She is requesting it be sent in. I advised her that Dr. Raliegh Ip is out until Monday 04/08/21 and could schedule appt with regina, NP. Pt was ok scheduling with regina, NP and I also advised if she continues to have no sleep and would want to do VUC visit she can and attempted to schedule for that today but pt preferred to wait. Advised her on how to schedule VUC herself and just to call back to cancel appt if needed.  ?

## 2021-04-02 NOTE — Telephone Encounter (Signed)
Unfortunately no earlier apt since I will be out of office leaving later today 3/21 and will return on 3/27 ? ?So if she has apt on 3/28 that is about the soonest I could see her to discuss this change of medication ? ?Nobie Putnam, DO ?[facility] ?[facility] ?04/02/2021, 10:18 AM ? ?

## 2021-04-02 NOTE — Telephone Encounter (Signed)
Noted, will discuss at upcoming appt 

## 2021-04-05 ENCOUNTER — Telehealth: Payer: Medicare Other | Admitting: Internal Medicine

## 2021-04-08 ENCOUNTER — Ambulatory Visit: Payer: Medicare Other | Admitting: Physician Assistant

## 2021-04-08 ENCOUNTER — Ambulatory Visit: Payer: Self-pay

## 2021-04-08 DIAGNOSIS — Z87891 Personal history of nicotine dependence: Secondary | ICD-10-CM | POA: Diagnosis not present

## 2021-04-08 DIAGNOSIS — I69351 Hemiplegia and hemiparesis following cerebral infarction affecting right dominant side: Secondary | ICD-10-CM | POA: Diagnosis not present

## 2021-04-08 DIAGNOSIS — Z720 Tobacco use: Secondary | ICD-10-CM | POA: Diagnosis not present

## 2021-04-08 DIAGNOSIS — Z79891 Long term (current) use of opiate analgesic: Secondary | ICD-10-CM | POA: Diagnosis not present

## 2021-04-08 DIAGNOSIS — M545 Low back pain, unspecified: Secondary | ICD-10-CM | POA: Diagnosis not present

## 2021-04-08 NOTE — Telephone Encounter (Addendum)
PT returned call. Pt will not be able to come to appointment today.Pt has a pain clinic appointment in [place] today. Pt will go to ED ASAP. ? ?Chief Complaint: Swollen and painful foot ?Symptoms: Same foot as before ?Frequency: Since Thursday ?Pertinent Negatives: Patient denies Fever. ?Disposition: '[]'$ ED /'[]'$ Urgent Care (no appt availability in office) / '[x]'$ Appointment(In office/virtual)/ '[]'$  [facility]/ '[]'$ Home Care/ '[]'$ Refused Recommended Disposition /'[]'$ [facility]/ '[]'$  Follow-up with PCP ?Additional Notes: Pt states that this is the same foot that needed treatment by the wound center. Pt sates that foot is very swollen and painful.  Pt's blood glucose is 290 this morning. ? ? ?Reason for Disposition ? [1] Ulcer, wound, blister, sore, or red area AND [2] new or increasing ? ?Answer Assessment - Initial Assessment Questions ?1. SYMPTOM: "What's the main symptom you're concerned about?" (e.g., rash, sore, callus, drainage, numbness) ?    Cold toes - swollen ?2. LOCATION: "Where is the swelling located?" (e.g., foot/toe, top/bottom, left/right) ?    Right foot ?3. APPEARANCE: "What does the area look like?" (e.g., normal, red, swollen; size) ?    swollen ?4. ONSET: "When did the  swelling  start?" ?    Thursday ?5. PAIN: "Is there any pain?" If Yes, ask: "How bad is it?" (Scale: 1-10; mild, moderate, severe) ?    pain ?6. CAUSE: "What do you think is causing the symptoms?" ?    infection ?7. OTHER SYMPTOMS: "Do you have any other symptoms?" (e.g., fever, weakness) ?    na ?8. PREGNANCY: "Is there any chance you are pregnant?" "When was your last menstrual period?" ?    na ? ?Protocols used: Diabetes - Foot Problems and Questions-A-AH ? ?

## 2021-04-10 ENCOUNTER — Telehealth: Payer: Medicare Other

## 2021-04-10 ENCOUNTER — Telehealth: Payer: Self-pay | Admitting: Pharmacist

## 2021-04-10 NOTE — Telephone Encounter (Signed)
?  Chronic Care Management  ? ?Outreach Note ? ?04/10/2021 ?Name: [name] MRN: [id number] DOB: [birth year] ? ?Referred by: Olin Hauser, DO ?Reason for referral : No chief complaint on file. ? ? ?Was unable to reach patient via telephone today and have left HIPAA compliant voicemail asking patient to return my call.  ? ? ?Follow Up Plan: Will attempt to reach patient by telephone again within the next 30 days ? ?Wallace Cullens, PharmD, BCACP ?Clinical Pharmacist ?[facility] ?[phone] ? ?

## 2021-04-12 ENCOUNTER — Other Ambulatory Visit: Payer: Self-pay | Admitting: Family Medicine

## 2021-04-12 DIAGNOSIS — E1159 Type 2 diabetes mellitus with other circulatory complications: Secondary | ICD-10-CM

## 2021-04-12 DIAGNOSIS — Z794 Long term (current) use of insulin: Secondary | ICD-10-CM

## 2021-04-12 DIAGNOSIS — J449 Chronic obstructive pulmonary disease, unspecified: Secondary | ICD-10-CM

## 2021-04-12 DIAGNOSIS — E785 Hyperlipidemia, unspecified: Secondary | ICD-10-CM

## 2021-04-12 DIAGNOSIS — F17210 Nicotine dependence, cigarettes, uncomplicated: Secondary | ICD-10-CM

## 2021-04-12 DIAGNOSIS — E1169 Type 2 diabetes mellitus with other specified complication: Secondary | ICD-10-CM | POA: Diagnosis not present

## 2021-04-12 DIAGNOSIS — F331 Major depressive disorder, recurrent, moderate: Secondary | ICD-10-CM

## 2021-04-15 NOTE — Telephone Encounter (Signed)
Requested Prescriptions  ?Pending Prescriptions Disp Refills  ?? citalopram (CELEXA) 40 MG tablet [Pharmacy Med Name: CITALOPRAM '40MG'$  TABLETS] 90 tablet 0  ?  Sig: TAKE 1 TABLET(40 MG) BY MOUTH DAILY  ?  ? Psychiatry:  Antidepressants - SSRI Passed - 04/12/2021  5:04 PM  ?  ?  Passed - Completed PHQ-2 or PHQ-9 in the last 360 days  ?  ?  Passed - Valid encounter within last 6 months  ?  Recent Outpatient Visits   ?      ? 1 month ago Umbilical hernia with obstruction, without gangrene  ? [facility], Devonne Doughty, DO  ? 2 months ago Type 2 diabetes mellitus with other specified complication, with long-term current use of insulin ([place])  ? [place], DO  ? 4 months ago Psychophysiological insomnia  ? [place], DO  ? 5 months ago Diabetic ulcer of toe of right foot associated with type 2 diabetes mellitus, limited to breakdown of skin ([place])  ? [place], DO  ? 6 months ago Diabetic ulcer of toe of right foot associated with type 2 diabetes mellitus, limited to breakdown of skin ([place])  ? [place], DO  ?  ?  ?Future Appointments   ?        ? In 2 days [place] Devonne Doughty, DO [facility] [place]  ?  ? ?  ?  ?  ? ? ?

## 2021-04-17 ENCOUNTER — Encounter: Payer: Self-pay | Admitting: Family Medicine

## 2021-04-17 ENCOUNTER — Ambulatory Visit (INDEPENDENT_AMBULATORY_CARE_PROVIDER_SITE_OTHER): Payer: Medicare Other | Admitting: Family Medicine

## 2021-04-17 VITALS — BP 136/82 | HR 92 | Ht 66.0 in | Wt 231.0 lb

## 2021-04-17 DIAGNOSIS — L97511 Non-pressure chronic ulcer of other part of right foot limited to breakdown of skin: Secondary | ICD-10-CM

## 2021-04-17 DIAGNOSIS — H1033 Unspecified acute conjunctivitis, bilateral: Secondary | ICD-10-CM | POA: Diagnosis not present

## 2021-04-17 DIAGNOSIS — F5104 Psychophysiologic insomnia: Secondary | ICD-10-CM

## 2021-04-17 DIAGNOSIS — T148XXA Other injury of unspecified body region, initial encounter: Secondary | ICD-10-CM

## 2021-04-17 DIAGNOSIS — E11621 Type 2 diabetes mellitus with foot ulcer: Secondary | ICD-10-CM

## 2021-04-17 MED ORDER — POLYMYXIN B-TRIMETHOPRIM 10000-0.1 UNIT/ML-% OP SOLN: 1.0000 [drp] | Freq: Four times a day (QID) | OPHTHALMIC | 0 refills | Status: DC

## 2021-04-17 MED ORDER — QUETIAPINE FUMARATE 50 MG PO TABS: 50.0000 mg | ORAL_TABLET | Freq: Every day | ORAL | 2 refills | Status: DC

## 2021-04-17 MED ORDER — MUPIROCIN 2 % EX OINT: TOPICAL_OINTMENT | CUTANEOUS | 2 refills | Status: DC

## 2021-04-17 NOTE — Progress Notes (Signed)
? ?Subjective:  ? ? Patient ID: [name], female    DOB: September 23, 1965, 56 y.o.   MRN: [id number] ? ?ALLETTA MATTOS is a 56 y.o. female presenting on 04/17/2021 for Insomnia ? ? ?HPI ? ?Chronic Insomnia ?Difficulty with sleep onset primarily. ?On Wellbutrin ?Last visit with me for this same issue 11/2020, we discussed same problem, seems to have issue with secondary to mental health causing her insomnia. ?Previous rx Trazodone '50mg'$  up to '100mg'$  nightly without success. ?Also trial on Zolpidem '5mg'$  in past without success ?Interested in new sleep rx. She used to take Seroquel in the past, and it worked very well before. ? ?Conjunctivitis, bilateral ?R>L ?With reported crusty discharge from eyes at night worse, past 1 week. ? ?Abdominal Abrasion vs Burn ?Superifical wound that is healing but has scab on it still. ?No spreading redness or drainage ? ?Chronic Diabetic Ulcer Toe, R foot ?Followed by [facility] ?Asking for referral to Podiatrist. ? ? ?  04/17/2021  ?  2:35 PM 03/04/2021  ?  2:30 PM 01/18/2021  ?  1:10 PM  ?Depression screen PHQ 2/9  ?Decreased Interest 2 1 0  ?Down, Depressed, Hopeless 0 1 0  ?PHQ - 2 Score 2 2 0  ?Altered sleeping '3 2 1  '$ ?Tired, decreased energy '3 3 1  '$ ?Change in appetite '3 2 3  '$ ?Feeling bad or failure about yourself  0 0 0  ?Trouble concentrating 2 1 0  ?Moving slowly or fidgety/restless 2 0 0  ?Suicidal thoughts 0 0 0  ?PHQ-9 Score '15 10 5  '$ ?Difficult doing work/chores Very difficult Not difficult at all Not difficult at all  ? ? ?Social History  ? ?Tobacco Use  ? Smoking status: Every Day  ?  Packs/day: 1.50  ?  Years: 20.00  ?  Pack years: 30.00  ?  Types: Cigarettes  ? Smokeless tobacco: Never  ?Vaping Use  ? Vaping Use: Former  ?Substance Use Topics  ? Alcohol use: No  ? Drug use: Yes  ?  Types: Marijuana  ?  Comment: last smoked 2 days ago  8/4  ? ? ?Review of Systems ?Per HPI unless specifically indicated above ? ?   ?Objective:  ?  ?BP 136/82   Pulse 92   Ht '5\' 6"'$  (1.676 m)   Wt  231 lb (104.8 kg)   SpO2 99%   BMI 37.28 kg/m?   ?Wt Readings from Last 3 Encounters:  ?04/17/21 231 lb (104.8 kg)  ?03/04/21 231 lb (104.8 kg)  ?02/04/21 255 lb (115.7 kg)  ?  ?Physical Exam ?Vitals and nursing note reviewed.  ?Constitutional:   ?   General: She is not in acute distress. ?   Appearance: Normal appearance. She is well-developed. She is obese. She is not diaphoretic.  ?   Comments: Well-appearing, comfortable, cooperative  ?HENT:  ?   Head: Normocephalic and atraumatic.  ?Eyes:  ?   General:     ?   Right eye: No discharge.     ?   Left eye: No discharge.  ?   Conjunctiva/sclera: Conjunctivae normal.  ?Cardiovascular:  ?   Rate and Rhythm: Normal rate.  ?Pulmonary:  ?   Effort: Pulmonary effort is normal.  ?Musculoskeletal:  ?   Comments: In wheelchair  ?Skin: ?   General: Skin is warm and dry.  ?   Findings: Lesion (2.5 cm round superficial eschar healing on abdomen. no extending erythema) present. No erythema or rash.  ?Neurological:  ?  Mental Status: She is alert and oriented to person, place, and time.  ?Psychiatric:     ?   Mood and Affect: Mood normal.     ?   Behavior: Behavior normal.     ?   Thought Content: Thought content normal.  ?   Comments: Well groomed, good eye contact, normal speech and thoughts  ? ?Results for orders placed or performed in visit on 03/04/21  ?Microalbumin, urine  ?Result Value Ref Range  ? Microalb, Ur 3.4 mg/dL  ? RAM    ? ?   ?Assessment & Plan:  ? ?Problem List Items Addressed This Visit   ?None ?Visit Diagnoses   ? ? Psychophysiological insomnia    -  Primary  ? Relevant Medications  ? QUEtiapine (SEROQUEL) 50 MG tablet  ? Acute conjunctivitis of both eyes, unspecified acute conjunctivitis type      ? Relevant Medications  ? trimethoprim-polymyxin b (POLYTRIM) ophthalmic solution  ? Abrasion of skin      ? Relevant Medications  ? mupirocin ointment (BACTROBAN) 2 %  ? Diabetic ulcer of toe of right foot associated with type 2 diabetes mellitus, limited to  breakdown of skin ([place])      ? Relevant Medications  ? mupirocin ointment (BACTROBAN) 2 %  ? Other Relevant Orders  ? Ambulatory referral to Podiatry  ? ?  ?  ?Insomnia ?Secondary to depression / anxiety ?Failed Trazodone, Zolpidem '5mg'$  QHS PRN ? ?START Seroquel '50mg'$  QHS - can use half or double in future depending on dose needed. Notify us if need re order. Or adjust/refill ?Continues on Wellbutrin XL '150mg'$  daily, Citalopram '40mg'$  dailyi ? ?Abrasion vs Burn superficial abdomen ?Healing skin with some mild eschar ?No sign of secondary infection ?Trial on topical Mupirocin ointment BID 7-10 day ? ?R Foot DM Toe ulcer ?Return to Wound care as scheduled ?Referral to Podiatry as well. ? ?Conjunctivitis, bilateral ?Questionable if viral vs allergic, will empiric cover for bacterial with Polytrim eye drops ? ? ?Orders Placed This Encounter  ?Procedures  ? Ambulatory referral to Podiatry  ?  Referral Priority:   Routine  ?  Referral Type:   Consultation  ?  Referral Reason:   Specialty Services Required  ?  Requested Specialty:   Podiatry  ?  Number of Visits Requested:   1  ? ? ? ?Meds ordered this encounter  ?Medications  ? QUEtiapine (SEROQUEL) 50 MG tablet  ?  Sig: Take 1 tablet (50 mg total) by mouth at bedtime.  ?  Dispense:  30 tablet  ?  Refill:  2  ? trimethoprim-polymyxin b (POLYTRIM) ophthalmic solution  ?  Sig: Place 1 drop into both eyes every 6 (six) hours.  ?  Dispense:  10 mL  ?  Refill:  0  ? mupirocin ointment (BACTROBAN) 2 %  ?  Sig: APPLY TOPICALLY TO THE AFFECTED AREA TWICE DAILY FOR UP TO 7 TO 10 DAYS AS NEEDED FOR SKIN INFECTION  ?  Dispense:  22 g  ?  Refill:  2  ? ? ? ? ?Follow up plan: ?Return if symptoms worsen or fail to improve. ? ? ?Nobie Putnam, DO ?[facility] ?[facility] ?04/17/2021, 2:39 PM ?

## 2021-04-17 NOTE — Patient Instructions (Addendum)
Thank you for coming to the office today. ? ?Start Seroquel can start HALF tab = '25mg'$  daily at bedtime, eventually go up to 1 whole tab = '50mg'$ , and then again if need can increase to 2 tablets = '100mg'$ . Let me know what dose works well for you. ? ?Use topical Mupirocin antibiotic on the abdomen. ? ?[facility] ?Address: [address] [place], [place] [address] ?Hours: Open 8AM-5PM ?Phone: [phone] ? ?Conjunctivitis use antibiotic eye drops 1 drop in each eye every 6 hours or 4 times a day. For 7-10 days ?If does not help, likely allergic or other irritation causing it, can switch to OTC Anti histamine or allergy eye drops ? ?Return to Endocrinology. ? ?[facility] ?[address] ?[place], [place]  [address] ?Phone: [phone] ? ? ?Please schedule a Follow-up Appointment to: Return if symptoms worsen or fail to improve. ? ?If you have any other questions or concerns, please feel free to call the office or send a message through [facility]. You may also schedule an earlier appointment if necessary. ? ?Additionally, you may be receiving a survey about your experience at our office within a few days to 1 week by e-mail or mail. We value your feedback. ? ?Nobie Putnam, DO ?[facility] ?

## 2021-04-26 ENCOUNTER — Ambulatory Visit: Payer: Medicare Other | Admitting: Physician Assistant

## 2021-04-26 DIAGNOSIS — I69951 Hemiplegia and hemiparesis following unspecified cerebrovascular disease affecting right dominant side: Secondary | ICD-10-CM | POA: Diagnosis not present

## 2021-05-01 ENCOUNTER — Telehealth: Payer: Medicare Other

## 2021-05-01 ENCOUNTER — Telehealth: Payer: Self-pay | Admitting: Pharmacist

## 2021-05-01 NOTE — Telephone Encounter (Signed)
?  Chronic Care Management  ? ?Outreach Note ? ?05/01/2021 ?Name: [name] MRN: [id number] DOB: 11-05-1965 ? ?Referred by: Olin Hauser, DO ?Reason for referral : No chief complaint on file. ? ? ?Was unable to reach patient via telephone today and have left HIPAA compliant voicemail asking patient to return my call. (unsuccessful outreach #2) ? ? ?Follow Up Plan: CM Pharmacist will attempt to reach patient by telephone again next month ? ?Wallace Cullens, PharmD, BCACP ?Clinical Pharmacist ?[facility] ?[phone] ? ?

## 2021-05-06 DIAGNOSIS — I69351 Hemiplegia and hemiparesis following cerebral infarction affecting right dominant side: Secondary | ICD-10-CM | POA: Diagnosis not present

## 2021-05-06 DIAGNOSIS — Z79891 Long term (current) use of opiate analgesic: Secondary | ICD-10-CM | POA: Diagnosis not present

## 2021-05-06 DIAGNOSIS — M961 Postlaminectomy syndrome, not elsewhere classified: Secondary | ICD-10-CM | POA: Diagnosis not present

## 2021-05-07 ENCOUNTER — Ambulatory Visit (INDEPENDENT_AMBULATORY_CARE_PROVIDER_SITE_OTHER): Payer: Medicare Other | Admitting: Podiatry

## 2021-05-07 DIAGNOSIS — I70229 Atherosclerosis of native arteries of extremities with rest pain, unspecified extremity: Secondary | ICD-10-CM | POA: Diagnosis not present

## 2021-05-07 NOTE — Progress Notes (Signed)
? ?HPI: 56 y.o. female presenting today as a new patient with her husband for evaluation of pain to the right foot with swelling.  Past social history smoking 1.5 ppd, 30 pack years.  She says that she experiences pain and tenderness to the right foot especially at night when she sleeps.  She does have a recent history of a wound to the right great toe which fortunately healed with management at the wound care center.  ? ?Past Medical History:  ?Diagnosis Date  ? Anxiety   ? Cancer [facility])   ? a spot on liver and treated   ? Complication of anesthesia   ? restless,easily upset  ? COPD (chronic obstructive pulmonary disease) ([place])   ? Depression   ? Diverticulitis   ? Fatty liver   ? GERD (gastroesophageal reflux disease)   ? Headache(784.0)   ? migraines  ? History of kidney stones   ? Hyperlipidemia   ? Hypertension   ? Pneumonia   ? Restless   ? Stroke [facility])   ? ? ?Past Surgical History:  ?Procedure Laterality Date  ? ABDOMINAL HYSTERECTOMY    ? ACHILLES TENDON LENGTHENING Right 08/18/2017  ? Procedure: ACHILLES TENDON LENGTHENING;  Surgeon: Altamese Evarts, MD;  Location: [facility];  Service: Orthopedics;  Laterality: Right;  ? ANTERIOR CERVICAL DECOMP/DISCECTOMY FUSION N/A 03/11/2012  ? Procedure: ANTERIOR CERVICAL DECOMPRESSION/DISCECTOMY FUSION 1 LEVEL;  Surgeon: Ophelia Charter, MD;  Location: [facility];  Service: Neurosurgery;  Laterality: N/A;  Cervical five-six anterior cervical decompression with fusion interbody prothesis plating and bonegraft  ? BACK SURGERY    ? EXTERNAL FIXATION LEG Right 08/10/2017  ? Procedure: EXTERNAL FIXATION ANKLE;  Surgeon: Dereck Leep, MD;  Location: [facility] ORS;  Service: Orthopedics;  Laterality: Right;  ? EXTERNAL FIXATION REMOVAL Right 08/18/2017  ? Procedure: REMOVAL EXTERNAL FIXATION LEG;  Surgeon: Altamese Bellwood, MD;  Location: [facility];  Service: Orthopedics;  Laterality: Right;  ? EYE SURGERY    ? HERNIA REPAIR    ? JOINT REPLACEMENT    ? bil knees  ? ORIF ANKLE FRACTURE Right  08/18/2017  ? Procedure: OPEN REDUCTION INTERNAL FIXATION (ORIF) ANKLE FRACTURE;  Surgeon: Altamese Avonia, MD;  Location: [facility];  Service: Orthopedics;  Laterality: Right;  ? REPLACEMENT TOTAL KNEE BILATERAL    ? ? ?Allergies  ?Allergen Reactions  ? Tramadol Nausea And Vomiting and Hypertension  ? Augmentin [Amoxicillin-Pot Clavulanate] Rash  ? ?  ?Physical Exam: ?General: The patient is alert and oriented x3 in no acute distress. ? ?Dermatology: Skin is warm, dry and supple bilateral lower extremities. Negative for open lesions or macerations.  No open wounds.  Absence of the right hallux nail plate ? ?Vascular: Skin is cool to touch.  Unable to palpate PT or DP pulses right lower extremity.  Moderate edema noted ? ?Neurological: Light touch and protective diminished intact ? ?Musculoskeletal Exam: No pedal deformities noted ? ? ?Assessment: ?1.  Rest pain RLE ?2.  Suspect ischemic pain RLE ? ? ?Plan of Care:  ?1. Patient evaluated.  ?2.  Order placed for VAS [place] ABI w/wo TBI RLE ?3.  Recommend referral to vascular if results are abnormal ?4.  Return to clinic as needed ?  ?  ?Edrick Kins, DPM ?[place] ? ?Dr. Edrick Kins, DPM  ?  ?[address]                                        ?  [place], [place] [address]                ?Office [phone]  ?Fax [phone] ? ? ? ?Per ?

## 2021-05-08 ENCOUNTER — Ambulatory Visit (INDEPENDENT_AMBULATORY_CARE_PROVIDER_SITE_OTHER): Payer: Medicare Other | Admitting: Surgery

## 2021-05-08 ENCOUNTER — Encounter: Payer: Self-pay | Admitting: Surgery

## 2021-05-08 VITALS — BP 115/74 | HR 111 | Temp 98.3°F | Wt 233.0 lb

## 2021-05-08 DIAGNOSIS — K432 Incisional hernia without obstruction or gangrene: Secondary | ICD-10-CM

## 2021-05-08 NOTE — Patient Instructions (Addendum)
Speak with your primary care doctor as soon as possible about getting your diabetes under control. They may be able to get you into Endocrinology sooner or may be able to monitor your diabetes and labs.  ? ? ?Follow up here in 3 months. We will send you a letter about this appointment.  ? ? ? ?Ventral Hernia ? ?A ventral hernia is a bulge of tissue from inside the abdomen that pushes through a weak area of the muscles that form the front wall of the abdomen. The tissues inside the abdomen are inside a sac (peritoneum). These tissues include the small intestine, large intestine, and the fatty tissue that covers the intestines (omentum). Sometimes, the bulge that forms a hernia contains intestines. Other hernias contain only fat. Ventral hernias do not go away without surgical treatment. ?There are several types of ventral hernias. You may have: ?A hernia at an incision site from previous abdominal surgery (incisional hernia). ?A hernia just above the belly button (epigastric hernia), or at the belly button (umbilical hernia). These types of hernias can develop from heavy lifting or straining. ?A hernia that comes and goes (reducible hernia). It may be visible only when you lift or strain. This type of hernia can be pushed back into the abdomen (reduced). ?A hernia that traps abdominal tissue inside the hernia (incarcerated hernia). This type of hernia does not reduce. ?A hernia that cuts off blood flow to the tissues inside the hernia (strangulated hernia). The tissues can start to die if this happens. This is a very painful bulge that cannot be reduced. A strangulated hernia is a medical emergency. ?What are the causes? ?This condition is caused by abdominal tissue putting pressure on an area of weakness in the abdominal muscles. ?What increases the risk? ?The following factors may make you more likely to develop this condition: ?Being age 58 or older. ?Being overweight or obese. ?Having had previous abdominal  surgery, especially if there was an infection after surgery. ?Having had an injury to the abdominal wall. ?Frequently lifting or pushing heavy objects. ?Having had several pregnancies. ?Having a buildup of fluid inside the abdomen (ascites). ?Straining to have a bowel movement or to urinate. ?Having frequent coughing episodes. ?What are the signs or symptoms? ?The only symptom of a ventral hernia may be a painless bulge in the abdomen. A reducible hernia may be visible only when you strain, cough, or lift. Other symptoms may include: ?Dull pain. ?A feeling of pressure. ?Signs and symptoms of a strangulated hernia may include: ?Increasing pain. ?Nausea and vomiting. ?Pain when pressing on the hernia. ?The skin over the hernia turning red or purple. ?Constipation. ?Blood in the stool (feces). ?How is this diagnosed? ?This condition may be diagnosed based on: ?Your symptoms. ?Your medical history. ?A physical exam. You may be asked to cough or strain while standing. These actions increase the pressure inside your abdomen and force the hernia through the opening in your muscles. Your health care provider may try to reduce the hernia by gently pushing the hernia back in. ?Imaging studies, such as an ultrasound or CT scan. ?How is this treated? ?This condition is treated with surgery. If you have a strangulated hernia, surgery is done as soon as possible. If your hernia is small and not incarcerated, you may be asked to lose some weight before surgery. ?Follow these instructions at home: ?Follow instructions from your health care provider about eating or drinking restrictions. ?If you are overweight, your health care provider may recommend that  you increase your activity level and eat a healthier diet. ?Do not lift anything that is heavier than 10 lb (4.5 kg), or the limit that you are told, until your health care provider says that it is safe. ?Return to your normal activities as told by your health care provider. Ask  your health care provider what activities are safe for you. You may need to avoid activities that increase pressure on your hernia. ?Take over-the-counter and prescription medicines only as told by your health care provider. ?Keep all follow-up visits. This is important. ?Contact a health care provider if: ?Your hernia gets larger. ?Your hernia becomes painful. ?Get help right away if: ?Your hernia becomes increasingly painful. ?You have pain along with any of the following: ?Changes in skin color in the area of the hernia. ?Nausea. ?Vomiting. ?Fever. ?These symptoms may represent a serious problem that is an emergency. Do not wait to see if the symptoms will go away. Get medical help right away. Call your local emergency services (911 in the [place] Do not drive yourself to the hospital. ?Summary ?A ventral hernia is a bulge of tissue from inside the abdomen that pushes through a weak area of the muscles that form the front wall of the abdomen. ?This condition is treated with surgery, which may be urgent depending on your hernia. ?Do not lift anything that is heavier than 10 lb (4.5 kg), and follow activity instructions from your health care provider. ?This information is not intended to replace advice given to you by your health care provider. Make sure you discuss any questions you have with your health care provider. ?Document Revised: 08/19/2019 Document Reviewed: 08/19/2019 ?Elsevier Patient Education ? [place]. ? ?

## 2021-05-08 NOTE — Progress Notes (Signed)
?05/08/2021 ? ?History of Present Illness: ?[name] is a 56 y.o. female presenting for follow up of a recurrent ventral hernia.  The patient was last seen about 3 months ago as initial visit.  At that time, a CT scan was ordered for evaluation of her anatomy, and was encouraged to control her diabetes better given a very high HgA1c > 12.  The patient was set to have an endocrinology appointment with the [facility], but patient reports that she missed her appointment and was dropped.  Unfortunately there have otherwise been no changes in her diabetes control and she reports her glucose is in the 200s to 300s.  She also continues to smoke and smokes about 1 pack per day.  Her mobility continues to be an issues due to her ankle fracture and history of stroke in the past.   ? ?CT scan from 02/21/21 was personally viewed.  There's concern for two possible hernias, one that is above the umbilicus, and one below the umbilicus.  However, when viewing the images, I feel that there is a layer of fascia that is intact, but tenting outwards similar to diastasis.  This could perhaps be the hernia sac itself or old mesh. ? ?Past Medical History: ?Past Medical History:  ?Diagnosis Date  ? Anxiety   ? Cancer [facility])   ? a spot on liver and treated   ? Complication of anesthesia   ? restless,easily upset  ? COPD (chronic obstructive pulmonary disease) ([place])   ? Depression   ? Diverticulitis   ? Fatty liver   ? GERD (gastroesophageal reflux disease)   ? Headache(784.0)   ? migraines  ? History of kidney stones   ? Hyperlipidemia   ? Hypertension   ? Pneumonia   ? Restless   ? Stroke [facility])   ?  ? ?Past Surgical History: ?Past Surgical History:  ?Procedure Laterality Date  ? ABDOMINAL HYSTERECTOMY    ? ACHILLES TENDON LENGTHENING Right 08/18/2017  ? Procedure: ACHILLES TENDON LENGTHENING;  Surgeon: Altamese Pinehurst, MD;  Location: [place];  Service: Orthopedics;  Laterality: Right;  ? ANTERIOR CERVICAL DECOMP/DISCECTOMY FUSION N/A  03/11/2012  ? Procedure: ANTERIOR CERVICAL DECOMPRESSION/DISCECTOMY FUSION 1 LEVEL;  Surgeon: Ophelia Charter, MD;  Location: [place] [facility]URO [facility]S;  Service: Neurosurgery;  Laterality: N/A;  Cervical five-six anterior cervical decompression with fusion interbody prothesis plating and bonegraft  ? BACK SURGERY    ? EXTERNAL FIXATION LEG Right 08/10/2017  ? Procedure: EXTERNAL FIXATION ANKLE;  Surgeon: Dereck Leep, MD;  Location: [facility] ORS;  Service: Orthopedics;  Laterality: Right;  ? EXTERNAL FIXATION REMOVAL Right 08/18/2017  ? Procedure: REMOVAL EXTERNAL FIXATION LEG;  Surgeon: Altamese Zebulon, MD;  Location: [place];  Service: Orthopedics;  Laterality: Right;  ? EYE SURGERY    ? HERNIA REPAIR    ? JOINT REPLACEMENT    ? bil knees  ? ORIF ANKLE FRACTURE Right 08/18/2017  ? Procedure: OPEN REDUCTION INTERNAL FIXATION (ORIF) ANKLE FRACTURE;  Surgeon: Altamese Clyde, MD;  Location: [place];  Service: Orthopedics;  Laterality: Right;  ? REPLACEMENT TOTAL KNEE BILATERAL    ? ? ?Home Medications: ?Prior to Admission medications   ?Medication Sig Start Date End Date Taking? Authorizing Provider  ?albuterol (VENTOLIN HFA) 108 (90 Base) MCG/ACT inhaler INHALE 2 PUFFS INTO THE LUNGS EVERY 4 HOURS AS NEEDED FOR WHEEZING OR SHORTNESS OF BREATH 10/09/20  Yes Karamalegos, Devonne Doughty, DO  ?atorvastatin (LIPITOR) 40 MG tablet Take 1 tablet (40 mg total) by  mouth at bedtime. 11/28/20 11/28/21 Yes Karamalegos, Devonne Doughty, DO  ?buPROPion (WELLBUTRIN XL) 150 MG 24 hr tablet TAKE 1 TABLET(150 MG) BY MOUTH DAILY 02/22/21  Yes Karamalegos, Devonne Doughty, DO  ?citalopram (CELEXA) 40 MG tablet TAKE 1 TABLET(40 MG) BY MOUTH DAILY 04/15/21  Yes Karamalegos, Devonne Doughty, DO  ?clopidogrel (PLAVIX) 75 MG tablet Take 1 tablet (75 mg total) by mouth daily. 12/24/20  Yes Karamalegos, Devonne Doughty, DO  ?dicyclomine (BENTYL) 20 MG tablet Take 1 tablet (20 mg total) by mouth 4 (four) times daily -  before meals and at bedtime. As needed for abdominal pain cramping  03/04/21  Yes Karamalegos, Devonne Doughty, DO  ?gabapentin (NEURONTIN) 600 MG tablet Take 600 mg by mouth 4 (four) times daily. 05/29/20  Yes [provider]  ?insulin degludec (TRESIBA FLEXTOUCH) 100 UNIT/ML FlexTouch Pen Inject 10 Units into the skin daily.   Yes [provider]  ?linagliptin (TRADJENTA) 5 MG TABS tablet Take 5 mg by mouth daily. 07/08/13  Yes [provider]  ?metFORMIN (GLUCOPHAGE) 1000 MG tablet Take 1 tablet (1,000 mg total) by mouth 2 (two) times daily with a meal. 02/13/21  Yes Karamalegos, Devonne Doughty, DO  ?metoCLOPramide (REGLAN) 5 MG tablet Take 1 tablet (5 mg total) by mouth 3 (three) times daily before meals. 01/18/21  Yes Karamalegos, Devonne Doughty, DO  ?mupirocin ointment (BACTROBAN) 2 % APPLY TOPICALLY TO THE AFFECTED AREA TWICE DAILY FOR UP TO 7 TO 10 DAYS AS NEEDED FOR SKIN INFECTION 04/17/21  Yes Karamalegos, Devonne Doughty, DO  ?nystatin (MYCOSTATIN) 100000 UNIT/ML suspension Take 5 mLs (500,000 Units total) by mouth 4 (four) times daily. For 7-10 days 10/22/20  Yes Karamalegos, Devonne Doughty, DO  ?NYSTATIN powder APPLY TOPICALLY A SMALL AMOUNT TWICE DAILY AS NEEDED 11/22/20  Yes Karamalegos, Devonne Doughty, DO  ?ondansetron (ZOFRAN-ODT) 4 MG disintegrating tablet Take 1 tablet (4 mg total) by mouth every 8 (eight) hours as needed for nausea or vomiting. 01/18/21  Yes Karamalegos, Devonne Doughty, DO  ?oxyCODONE (ROXICODONE) 15 MG immediate release tablet Take 15 mg by mouth every 6 (six) hours as needed. 09/18/20  Yes [provider]  ?pantoprazole (PROTONIX) 40 MG tablet Take 1 tablet (40 mg total) by mouth daily before breakfast. 08/09/20  Yes Karamalegos, Devonne Doughty, DO  ?pioglitazone (ACTOS) 45 MG tablet Take 45 mg by mouth daily.   Yes [provider]  ?QUEtiapine (SEROQUEL) 50 MG tablet Take 1 tablet (50 mg total) by mouth at bedtime. 04/17/21  Yes Karamalegos, Devonne Doughty, DO  ?RELISTOR 150 MG TABS Take 3 tablets by mouth daily. 03/15/21  Yes [provider]  ?topiramate (TOPAMAX) 100 MG tablet TAKE 1 TABLET(100 MG) BY MOUTH TWICE DAILY 01/09/21  Yes Karamalegos, Alexander J, DO  ?TRELEGY ELLIPTA 100-62.5-25 MCG/INH AEPB Inhale 1 puff into the lungs daily. 07/23/20  Yes Karamalegos, Devonne Doughty, DO  ?trimethoprim-polymyxin b (POLYTRIM) ophthalmic solution Place 1 drop into both eyes every 6 (six) hours. 04/17/21  Yes Olin Hauser, DO  ?Vitamin D, Ergocalciferol, (DRISDOL) 50000 units CAPS capsule Take 50,000 Units by mouth every 7 (seven) days. On Wednesday   Yes [provider]  ? ? ?Allergies: ?Allergies  ?Allergen Reactions  ? Tramadol Nausea And Vomiting and Hypertension  ? Augmentin [Amoxicillin-Pot Clavulanate] Rash  ? ? ?Review of Systems: ?Review of Systems  ?Constitutional:  Negative for chills and fever.  ?Respiratory:  Negative for shortness of breath.   ?Cardiovascular:  Negative for chest pain.  ?Gastrointestinal:  Positive  for abdominal pain. Negative for nausea and vomiting.  ?Skin:  Negative for rash.  ? ?Physical Exam ?BP 115/74   Pulse (!) 111   Temp 98.3 ?F (36.8 ?C)   Wt 233 lb (105.7 kg)   SpO2 94%   BMI 37.61 kg/m?  ?CONSTITUTIONAL: No acute distress, cigarette smell in the room. ?HEENT:  Normocephalic, atraumatic, extraocular motion intact. ?RESPIRATORY:  Normal respiratory effort without pathologic use of accessory muscles. ?CARDIOVASCULAR: Regular rhythm and rate. ?GI: The abdomen is soft, obese, non-distended, with some tenderness in the upper hernia area just above the umbilicus.  The area is soft and can be pushed inwards, but I do not feel a distinguishable hernia edges/defect.  Same with the lower portion, but is less tender.  ?MUSCULOSKELETAL:  Patient is on a wheelchair and has difficulty getting from wheelchair to exam bed. ?NEUROLOGIC:  Motor and sensation is grossly normal.  Cranial nerves are grossly intact. ?PSYCH:  Alert and oriented to person, place and time. Affect is  normal. ? ?Labs/Imaging: ?CT scan abdomen/pelvis on 02/21/21: ?IMPRESSION: ?1. Ventral hernia along the midline of the lower abdominal and ?pelvic walls, as described above. ?2. Postoperative changes within the lower lumbar spine. ?3. Aortic ath

## 2021-05-11 ENCOUNTER — Other Ambulatory Visit: Payer: Self-pay | Admitting: Family Medicine

## 2021-05-11 DIAGNOSIS — K219 Gastro-esophageal reflux disease without esophagitis: Secondary | ICD-10-CM

## 2021-05-12 DIAGNOSIS — E1169 Type 2 diabetes mellitus with other specified complication: Secondary | ICD-10-CM | POA: Diagnosis not present

## 2021-05-13 DIAGNOSIS — M961 Postlaminectomy syndrome, not elsewhere classified: Secondary | ICD-10-CM | POA: Diagnosis not present

## 2021-05-13 DIAGNOSIS — Z79891 Long term (current) use of opiate analgesic: Secondary | ICD-10-CM | POA: Diagnosis not present

## 2021-05-13 DIAGNOSIS — M25561 Pain in right knee: Secondary | ICD-10-CM | POA: Diagnosis not present

## 2021-05-13 DIAGNOSIS — M545 Low back pain, unspecified: Secondary | ICD-10-CM | POA: Diagnosis not present

## 2021-05-13 DIAGNOSIS — I69351 Hemiplegia and hemiparesis following cerebral infarction affecting right dominant side: Secondary | ICD-10-CM | POA: Diagnosis not present

## 2021-05-13 NOTE — Telephone Encounter (Signed)
Requested Prescriptions  ?Pending Prescriptions Disp Refills  ?? pantoprazole (PROTONIX) 40 MG tablet [Pharmacy Med Name: PANTOPRAZOLE '40MG'$  TABLETS] 90 tablet 0  ?  Sig: TAKE 1 TABLET BY MOUTH EVERY DAY  ?  ? Gastroenterology: Proton Pump Inhibitors Passed - 05/11/2021  8:09 AM  ?  ?  Passed - Valid encounter within last 12 months  ?  Recent Outpatient Visits   ?      ? 3 weeks ago Psychophysiological insomnia  ? [place], DO  ? 2 months ago Umbilical hernia with obstruction, without gangrene  ? [place], DO  ? 3 months ago Type 2 diabetes mellitus with other specified complication, with long-term current use of insulin ([place])  ? [place], DO  ? 5 months ago Psychophysiological insomnia  ? [place], DO  ? 6 months ago Diabetic ulcer of toe of right foot associated with type 2 diabetes mellitus, limited to breakdown of skin ([place])  ? [place], DO  ?  ?  ?Future Appointments   ?        ? In 2 days [place] Ranger Devonne Doughty, DO [facility] [place]  ?  ? ?  ?  ?  ? ?

## 2021-05-15 ENCOUNTER — Ambulatory Visit (INDEPENDENT_AMBULATORY_CARE_PROVIDER_SITE_OTHER): Payer: Medicare Other | Admitting: Family Medicine

## 2021-05-15 ENCOUNTER — Encounter: Payer: Self-pay | Admitting: Family Medicine

## 2021-05-15 VITALS — BP 112/62 | HR 75 | Temp 97.3°F | Wt 233.0 lb

## 2021-05-15 DIAGNOSIS — L309 Dermatitis, unspecified: Secondary | ICD-10-CM

## 2021-05-15 DIAGNOSIS — J321 Chronic frontal sinusitis: Secondary | ICD-10-CM

## 2021-05-15 DIAGNOSIS — G894 Chronic pain syndrome: Secondary | ICD-10-CM

## 2021-05-15 DIAGNOSIS — E1169 Type 2 diabetes mellitus with other specified complication: Secondary | ICD-10-CM

## 2021-05-15 DIAGNOSIS — H6983 Other specified disorders of Eustachian tube, bilateral: Secondary | ICD-10-CM | POA: Diagnosis not present

## 2021-05-15 DIAGNOSIS — Z794 Long term (current) use of insulin: Secondary | ICD-10-CM | POA: Diagnosis not present

## 2021-05-15 MED ORDER — FLUTICASONE PROPIONATE 50 MCG/ACT NA SUSP: 2.0000 | Freq: Every day | NASAL | 3 refills | Status: DC

## 2021-05-15 MED ORDER — GABAPENTIN 600 MG PO TABS: 600.0000 mg | ORAL_TABLET | Freq: Four times a day (QID) | ORAL | 0 refills | Status: DC

## 2021-05-15 MED ORDER — TRIAMCINOLONE ACETONIDE 0.1 % EX CREA: 1.0000 "application " | TOPICAL_CREAM | Freq: Two times a day (BID) | CUTANEOUS | 2 refills | Status: DC

## 2021-05-15 MED ORDER — MOUNJARO 2.5 MG/0.5ML ~~LOC~~ SOAJ: 2.5000 mg | SUBCUTANEOUS | 0 refills | Status: DC

## 2021-05-15 NOTE — Patient Instructions (Addendum)
Thank you for coming to the office today. ? ?[facility]   ?Address: [address] [place], [place] [address] ?Phone: [phone]  ?Website: [email or web address] ? ?Stop Tradjenta ?Start Mounjaro sample 2.'5mg'$  weekly for 4 weeks ?Before you run out, call us in advance to request a new order. The Mounjaro '5mg'$  weekly, can order 90 day ? ?Labs A1c ? ?Keep on Actos and Metformin for now. Future will increase Mounjaro more and decrease Actos ? ?Cream Triamcinolone for Thumb. ? ?Start nasal steroid Flonase 2 sprays in each nostril daily for 4-6 weeks, may repeat course seasonally or as needed ? ? ?Please schedule a Follow-up Appointment to: Return in about 3 months (around 08/15/2021) for 3 month DM A1c. ? ?If you have any other questions or concerns, please feel free to call the office or send a message through [facility]. You may also schedule an earlier appointment if necessary. ? ?Additionally, you may be receiving a survey about your experience at our office within a few days to 1 week by e-mail or mail. We value your feedback. ? ?Nobie Putnam, DO ?[place] ?

## 2021-05-15 NOTE — Progress Notes (Signed)
? ?Subjective:  ? ? Patient ID: [name], female    DOB: 1965/01/24, 56 y.o.   MRN: [id number] ? ?[name] is a 56 y.o. female presenting on 05/15/2021 for No chief complaint on file. ? ? ?HPI ? ?CHRONIC DM, Type 2: ?Reports concerns ?Avg 240 ?Prior A1c 12.5 (04/2020) ?Meds: Tradjenta '5mg'$  daily, Actos '45mg'$  daily, Metformin '1000mg'$  BID ?- Not on Insulin anymore. Previously on Tresiba ?Reports not good compliance, but now taking meds ?Recommend return to [facility] yearly for DM eye exam ?Denies hypoglycemia, polyuria, visual changes, numbness or tingling. ? ?Hoarseness / Sore Throat ?Sinusitis ?Reports onset for past few days worse with hoarse voice but has had cough and drainage ear pressure for while now. ?Active smoker ? ?Podiatry has ordered ABI vascular US on lower extremity. If abnormal then they would pursue vascular referral. ?Issue with diabetic ulcer ? ? ? ?  04/17/2021  ?  2:35 PM 03/04/2021  ?  2:30 PM 01/18/2021  ?  1:10 PM  ?Depression screen PHQ 2/9  ?Decreased Interest 2 1 0  ?Down, Depressed, Hopeless 0 1 0  ?PHQ - 2 Score 2 2 0  ?Altered sleeping '3 2 1  '$ ?Tired, decreased energy '3 3 1  '$ ?Change in appetite '3 2 3  '$ ?Feeling bad or failure about yourself  0 0 0  ?Trouble concentrating 2 1 0  ?Moving slowly or fidgety/restless 2 0 0  ?Suicidal thoughts 0 0 0  ?PHQ-9 Score '15 10 5  '$ ?Difficult doing work/chores Very difficult Not difficult at all Not difficult at all  ? ? ?Social History  ? ?Tobacco Use  ? Smoking status: Every Day  ?  Packs/day: 1.00  ?  Years: 20.00  ?  Pack years: 20.00  ?  Types: Cigarettes  ? Smokeless tobacco: Never  ?Vaping Use  ? Vaping Use: Former  ?Substance Use Topics  ? Alcohol use: No  ? Drug use: Yes  ?  Types: Marijuana  ?  Comment: last smoked 2 days ago  8/4  ? ? ?Review of Systems ?Per HPI unless specifically indicated above ? ?   ?Objective:  ?  ?BP 112/62 (BP Location: Right Arm, Patient Position: Sitting, Cuff Size: Large)   Pulse 75   Temp (!) 97.3 ?F (36.3 ?C)  (Temporal)   Wt 233 lb (105.7 kg) Comment: Per pt.  SpO2 99%   BMI 37.61 kg/m?   ?Wt Readings from Last 3 Encounters:  ?05/15/21 233 lb (105.7 kg)  ?05/08/21 233 lb (105.7 kg)  ?04/17/21 231 lb (104.8 kg)  ?  ?Physical Exam ?Vitals and nursing note reviewed.  ?Constitutional:   ?   General: She is not in acute distress. ?   Appearance: She is well-developed. She is obese. She is not diaphoretic.  ?   Comments: Well-appearing, comfortable, cooperative  ?HENT:  ?   Head: Normocephalic and atraumatic.  ?Eyes:  ?   General:     ?   Right eye: No discharge.     ?   Left eye: No discharge.  ?   Conjunctiva/sclera: Conjunctivae normal.  ?Neck:  ?   Thyroid: No thyromegaly.  ?Cardiovascular:  ?   Rate and Rhythm: Normal rate and regular rhythm.  ?   Heart sounds: Normal heart sounds. No murmur heard. ?Pulmonary:  ?   Effort: Pulmonary effort is normal. No respiratory distress.  ?   Breath sounds: Normal breath sounds. No wheezing or rales.  ?Musculoskeletal:     ?  General: Normal range of motion.  ?   Cervical back: Normal range of motion and neck supple.  ?Lymphadenopathy:  ?   Cervical: No cervical adenopathy.  ?Skin: ?   General: Skin is warm and dry.  ?   Findings: No erythema or rash.  ?Neurological:  ?   Mental Status: She is alert and oriented to person, place, and time.  ?Psychiatric:     ?   Behavior: Behavior normal.  ?   Comments: Well groomed, good eye contact, normal speech and thoughts  ? ?Results for orders placed or performed in visit on 03/04/21  ?Microalbumin, urine  ?Result Value Ref Range  ? Microalb, Ur 3.4 mg/dL  ? RAM    ? ?   ?Assessment & Plan:  ? ?Problem List Items Addressed This Visit   ? ? Type 2 diabetes mellitus with other specified complication ([place]) - Primary  ? Relevant Medications  ? MOUNJARO 2.5 MG/0.5ML Pen  ? Other Relevant Orders  ? Hemoglobin C6C  ? BASIC METABOLIC PANEL WITH GFR  ? Chronic pain syndrome  ? Relevant Medications  ? ZUBSOLV 5.7-1.4 MG SUBL  ? gabapentin (NEURONTIN)  600 MG tablet  ? ?Other Visit Diagnoses   ? ? Chronic frontal sinusitis      ? Relevant Medications  ? fluticasone (FLONASE) 50 MCG/ACT nasal spray  ? Eustachian tube dysfunction, bilateral      ? Relevant Medications  ? fluticasone (FLONASE) 50 MCG/ACT nasal spray  ? Eczema, unspecified type      ? Relevant Medications  ? triamcinolone cream (KENALOG) 0.1 %  ? ?  ?  ?Diabetes, uncontrolled ?Complications DM ulcer, hyperlipidemia, obesity  ?Unable to return to local Endocrinology due to no shows ?>1 yr since A1c, >12 range, need labs and further management. ?She admits not adhering to therapy and plan now ready to pursue ? ?Needs DM Eye exam ?[facility]   ?Address: [address] [place], [place] [address] ?Phone: [phone]  ?Website: [email or web address] ? ?Stop Tradjenta ?Start Mounjaro sample 2.'5mg'$  weekly for 4 weeks - given today ?Before you run out, call us in advance to request a new order. The Mounjaro '5mg'$  weekly, can order 90 day ? ?Labs A1c today ? ?Keep on Actos and Metformin for now. Future will increase Mounjaro more and decrease Actos ? ?Cream Triamcinolone for Thumb. ? ?Start nasal steroid Flonase 2 sprays in each nostril daily for 4-6 weeks, may repeat course seasonally or as needed ? ?Podiatry referring to ABI studies and may warrant vascular. ? ?Meds ordered this encounter  ?Medications  ? fluticasone (FLONASE) 50 MCG/ACT nasal spray  ?  Sig: Place 2 sprays into both nostrils daily. Use for 4-6 weeks then stop and use seasonally or as needed.  ?  Dispense:  16 g  ?  Refill:  3  ? triamcinolone cream (KENALOG) 0.1 %  ?  Sig: Apply 1 application. topically 2 (two) times daily.  ?  Dispense:  30 g  ?  Refill:  2  ? MOUNJARO 2.5 MG/0.5ML Pen  ?  Sig: Inject 2.5 mg into the skin once a week.  ?  Dispense:  2 mL  ?  Refill:  0  ? gabapentin (NEURONTIN) 600 MG tablet  ?  Sig: Take 1 tablet (600 mg total) by mouth 4 (four) times daily.  ?  Dispense:  360 tablet  ?  Refill:  0  ? ? ? ? ?Follow up  plan: ?Return in about 3 months (  around 08/15/2021) for 3 month DM A1c. ? ? ?Nobie Putnam, DO ?[facility] ?[facility] ?05/15/2021, 1:28 PM ?

## 2021-05-16 LAB — BASIC METABOLIC PANEL WITH GFR
BUN: 13 mg/dL (ref 7–25)
CO2: 29 mmol/L (ref 20–32)
Calcium: 8.6 mg/dL (ref 8.6–10.4)
Chloride: 102 mmol/L (ref 98–110)
Creat: 0.62 mg/dL (ref 0.50–1.03)
Glucose, Bld: 261 mg/dL — ABNORMAL HIGH (ref 65–139)
Potassium: 4.9 mmol/L (ref 3.5–5.3)
Sodium: 139 mmol/L (ref 135–146)
eGFR: 104 mL/min/{1.73_m2} (ref >=60)

## 2021-05-16 LAB — HEMOGLOBIN A1C
Hgb A1c MFr Bld: 9.7 % of total Hgb — ABNORMAL HIGH (ref <5.7)
Mean Plasma Glucose: 232 mg/dL
eAG (mmol/L): 12.8 mmol/L

## 2021-05-17 ENCOUNTER — Ambulatory Visit (INDEPENDENT_AMBULATORY_CARE_PROVIDER_SITE_OTHER): Payer: Medicare Other | Admitting: Pharmacist

## 2021-05-17 DIAGNOSIS — E1169 Type 2 diabetes mellitus with other specified complication: Secondary | ICD-10-CM

## 2021-05-17 NOTE — Chronic Care Management (AMB) (Signed)
? ?Chronic Care Management ?CCM Pharmacy Note ? ?05/17/2021 ?Name:  [name] MRN:  [id number] DOB:  Apr 16, 1965 ? ?Subjective: ?[name] is an 56 y.o. year old female who is a primary patient of Olin Hauser, DO.  The CCM team was consulted for assistance with disease management and care coordination needs.   ? ?Engaged with patient by telephone for follow up visit for pharmacy case management and/or care coordination services.  ? ?Objective: ? ?Medications   ? ?  Med List Status: <None>  ? ?Medication Order Taking? Sig Documenting Provider Last Dose Status Informant  ?albuterol (VENTOLIN HFA) 108 (90 Base) MCG/ACT inhaler [id number]  INHALE 2 PUFFS INTO THE LUNGS EVERY 4 HOURS AS NEEDED FOR WHEEZING OR SHORTNESS OF BREATH Karamalegos, Devonne Doughty, DO  Active   ?atorvastatin (LIPITOR) 40 MG tablet [id number]  Take 1 tablet (40 mg total) by mouth at bedtime. Olin Hauser, DO  Active   ?buPROPion (WELLBUTRIN XL) 150 MG 24 hr tablet [id number]  TAKE 1 TABLET(150 MG) BY MOUTH DAILY Parks Ranger, Devonne Doughty, DO  Active   ?citalopram (CELEXA) 40 MG tablet [id number]  TAKE 1 TABLET(40 MG) BY MOUTH DAILY Parks Ranger, Devonne Doughty, DO  Active   ?clopidogrel (PLAVIX) 75 MG tablet [id number]  Take 1 tablet (75 mg total) by mouth daily. Olin Hauser, DO  Active   ?dicyclomine (BENTYL) 20 MG tablet [id number]  Take 1 tablet (20 mg total) by mouth 4 (four) times daily -  before meals and at bedtime. As needed for abdominal pain cramping Olin Hauser, DO  Active   ?fluticasone (FLONASE) 50 MCG/ACT nasal spray [id number]  Place 2 sprays into both nostrils daily. Use for 4-6 weeks then stop and use seasonally or as needed. Olin Hauser, DO  Active   ?gabapentin (NEURONTIN) 600 MG tablet [id number]  Take 1 tablet (600 mg total) by mouth 4 (four) times daily. Olin Hauser, DO  Active   ?insulin degludec (TRESIBA FLEXTOUCH) 100 UNIT/ML FlexTouch Pen [id number]  Inject  10 Units into the skin daily.  ?Patient not taking: Reported on 05/15/2021  ? [provider]  Active   ?metFORMIN (GLUCOPHAGE) 1000 MG tablet [id number] Yes Take 1 tablet (1,000 mg total) by mouth 2 (two) times daily with a meal. Parks Ranger, Devonne Doughty, DO Taking Active   ?metoCLOPramide (REGLAN) 5 MG tablet [id number]  Take 1 tablet (5 mg total) by mouth 3 (three) times daily before meals. Olin Hauser, DO  Active   ?MOUNJARO 2.5 MG/0.5ML Pen [id number] Yes Inject 2.5 mg into the skin once a week. Olin Hauser, DO Taking Active   ?mupirocin ointment (BACTROBAN) 2 % [id number]  APPLY TOPICALLY TO THE AFFECTED AREA TWICE DAILY FOR UP TO 7 TO 10 DAYS AS NEEDED FOR SKIN INFECTION Olin Hauser, DO  Active   ?nystatin (MYCOSTATIN) 100000 UNIT/ML suspension [id number]  Take 5 mLs (500,000 Units total) by mouth 4 (four) times daily. For 7-10 days Olin Hauser, DO  Active   ?NYSTATIN powder [id number]  APPLY TOPICALLY A SMALL AMOUNT TWICE DAILY AS NEEDED Parks Ranger, Devonne Doughty, DO  Active   ?ondansetron (ZOFRAN-ODT) 4 MG disintegrating tablet [id number]  Take 1 tablet (4 mg total) by mouth every 8 (eight) hours as needed for nausea or vomiting. Olin Hauser, DO  Active   ?oxyCODONE (ROXICODONE) 15 MG immediate release tablet [id number]  Take 15 mg by mouth every 6 (six) hours as needed.  ?Patient not taking: Reported on 05/15/2021  ?  [provider]  Active   ?pantoprazole (PROTONIX) 40 MG tablet [id number]  TAKE 1 TABLET BY MOUTH EVERY DAY Karamalegos, Devonne Doughty, DO  Active   ?pioglitazone (ACTOS) 45 MG tablet [id number] Yes Take 45 mg by mouth daily. [provider] Taking Active Pharmacy Records  ?QUEtiapine (SEROQUEL) 50 MG tablet [id number]  Take 1 tablet (50 mg total) by mouth at bedtime. Olin Hauser, DO  Active   ?RELISTOR 150 MG TABS [id number]  Take 3 tablets by mouth daily. [provider]  Active   ?topiramate  (TOPAMAX) 100 MG tablet [id number]  TAKE 1 TABLET(100 MG) BY MOUTH TWICE DAILY Parks Ranger, Devonne Doughty, DO  Active   ?TRELEGY ELLIPTA 100-62.5-25 MCG/INH AEPB [id number]  Inhale 1 puff into the lungs daily. Olin Hauser, DO  Active   ?triamcinolone cream (KENALOG) 0.1 % [id number]  Apply 1 application. topically 2 (two) times daily. Olin Hauser, DO  Active   ?trimethoprim-polymyxin b (POLYTRIM) ophthalmic solution [id number]  Place 1 drop into both eyes every 6 (six) hours. Olin Hauser, DO  Active   ?Vitamin D, Ergocalciferol, (DRISDOL) 50000 units CAPS capsule [id number]  Take 50,000 Units by mouth every 7 (seven) days. On Wednesday [provider]  Active   ?ZUBSOLV 5.7-1.4 MG SUBL [id number]  Place 1 tablet under the tongue 2 (two) times daily. [provider]  Active   ? ?  ?  ? ?  ? ? ?Pertinent Labs:  ?Lab Results  ?Component Value Date  ? HGBA1C 9.7 (H) 05/15/2021  ? ?Lab Results  ?Component Value Date  ? CHOL 136 04/07/2017  ? HDL 31 (L) 04/07/2017  ? [place] 67 04/07/2017  ? TRIG 191 (H) 04/07/2017  ? CHOLHDL 4.4 04/07/2017  ? ?Lab Results  ?Component Value Date  ? CREATININE 0.62 05/15/2021  ? BUN 13 05/15/2021  ? NA 139 05/15/2021  ? K 4.9 05/15/2021  ? CL 102 05/15/2021  ? CO2 29 05/15/2021  ? ? ?SDOH:  (Social Determinants of Health) assessments and interventions performed:  ? ? ?CCM Care Plan ? ?Review of patient past medical history, allergies, medications, health status, including review of consultants reports, laboratory and other test data, was performed as part of comprehensive evaluation and provision of chronic care management services.  ? ?Care Plan : PharmD - Medication Adherence/T2DM Management  ?Updates made by Rennis Petty, RPH-CPP since 05/17/2021 12:00 AM  ?  ? ?Problem: Disease Progression   ?  ? ?Long-Range Goal: Disease Progression Prevented or Minimized   ?Start Date: 08/22/2020  ?Expected End Date: 11/20/2020  ?Recent Progress: On  track  ?Priority: High  ?Note:   ?Current Barriers:  ?Unable to achieve control of A1C  ?Lack of blood sugar results for clinical team ? ?Pharmacist Clinical Goal(s):  ?Over the next 90 days, patient will achieve adherence to monitoring guidelines and medication adherence to achieve therapeutic efficacy through collaboration with PharmD and provider.  ? ? ?Interventions: ?1:1 collaboration with Olin Hauser, DO regarding development and update of comprehensive plan of care as evidenced by provider attestation and co-signature ?Inter-disciplinary care team collaboration (see longitudinal plan of care) ?Perform chart review. Patient seen for Office Visit with PCP on 5/3. Provider advised patient: ?Stop Tradjenta ?Start Mounjaro sample 2.'5mg'$  weekly for 4 weeks ?Before you run out, call us in advance to request a new order. [facility] '5mg'$  weekly, can order 90 day ?Keep on Actos and Metformin for now. Future will increase Mounjaro more and decrease Actos ?  Start nasal steroid Flonase 2 sprays in each nostril daily for 4-6 weeks, may repeat course seasonally or as needed ?Needs DM Eye exam ?Receive a message from patient requesting a call back ?Speak with patient briefly today. Declines to complete medication review today. Will plan to complete during upcoming telephone appointment ?Today patient reports again having constipation over the past week. Notes that constipation has been a chronic problem since she has been on pain medication ?Reports taking Relistor 150 mg - 3 tablets (450 mg) daily as prescribed by pain management provider for constipation ?Discuss importance of hydration ?Patient states will contact pain management specialist or PCP regarding constipation if needed ? ?Type 2 Diabetes: ?Uncontrolled; current treatment: ?Metformin 1000 mg twice daily ?Pioglitazone 45 mg daily ?Mounjaro 2.5 mg weekly on Wednesdays (started on 5/3) ?Confirms stopped Tradjenta ?Reports no longer taking  [place] ?Reports recent blood sugar readings: ?5/4: morning fasting: 242; 2 hours after supper: 198 ?Denies symptoms of hypoglycemia ?Encourage to have regular well-balanced meals throughout the day, while controlling

## 2021-05-17 NOTE — Patient Instructions (Signed)
Visit Information ? ?Thank you for taking time to visit with me today. Please don't hesitate to contact me if I can be of assistance to you before our next scheduled telephone appointment. ? ?Following are the goals we discussed today:  ? Goals Addressed   ? ?  ?  ?  ?  ? This Visit's Progress  ?  Pharmacy Goals     ?  Our goal A1c is less than 7%. This corresponds with fasting sugars less than 130 and 2 hour after meal sugars less than 180. Please check your blood sugar, keep a log of the results and have this for [place] to review during our next telephone appointment ? ?Our goal bad cholesterol, or LDL, is less than 70 . This is why it is important to continue taking your atorvastatin ? ?Feel free to call me with any questions or concerns. I look forward to our next call! ? ?Wallace Cullens, PharmD, BCACP, CPP ?Clinical Pharmacist ?[facility] ?[place] ?[phone] ?  ? ?  ? ? ? ?Our next appointment is by telephone on 06/05/21 at 11:15 am ? ?Please call the care guide team at [phone] if you need to cancel or reschedule your appointment.  ? ? ?Patient verbalizes understanding of instructions and care plan provided today and agrees to view in [place]. Active MyChart status confirmed with patient.   ? ?

## 2021-05-20 DIAGNOSIS — M545 Low back pain, unspecified: Secondary | ICD-10-CM | POA: Diagnosis not present

## 2021-05-20 DIAGNOSIS — M961 Postlaminectomy syndrome, not elsewhere classified: Secondary | ICD-10-CM | POA: Diagnosis not present

## 2021-05-20 DIAGNOSIS — I69351 Hemiplegia and hemiparesis following cerebral infarction affecting right dominant side: Secondary | ICD-10-CM | POA: Diagnosis not present

## 2021-05-20 DIAGNOSIS — M25561 Pain in right knee: Secondary | ICD-10-CM | POA: Diagnosis not present

## 2021-05-20 DIAGNOSIS — Z79891 Long term (current) use of opiate analgesic: Secondary | ICD-10-CM | POA: Diagnosis not present

## 2021-05-22 ENCOUNTER — Other Ambulatory Visit: Payer: Self-pay | Admitting: Family Medicine

## 2021-05-22 DIAGNOSIS — B370 Candidal stomatitis: Secondary | ICD-10-CM

## 2021-05-23 NOTE — Telephone Encounter (Signed)
Requested medication (s) are due for refill today:   No ? ?Requested medication (s) are on the active medication list:   No ? ?Future visit scheduled:   No ? ? ?Last ordered: Discontinued due to course being completed on 03/27/2021 ? ?Returned because not on med list.  Been discontinued  ? ?Requested Prescriptions  ?Pending Prescriptions Disp Refills  ? fluconazole (DIFLUCAN) 150 MG tablet [Pharmacy Med Name: FLUCONAZOLE '150MG'$  TABLETS] 7 tablet 1  ?  Sig: TAKE 1 TABLET BY MOUTH DAILY  ?  ? Off-Protocol Failed - 05/22/2021  8:15 PM  ?  ?  Failed - Medication not assigned to a protocol, review manually.  ?  ?  Passed - Valid encounter within last 12 months  ?  Recent Outpatient Visits   ? ?      ? 1 week ago Type 2 diabetes mellitus with other specified complication, with long-term current use of insulin ([place])  ? [place], DO  ? 1 month ago Psychophysiological insomnia  ? [place], DO  ? 2 months ago Umbilical hernia with obstruction, without gangrene  ? [place], DO  ? 4 months ago Type 2 diabetes mellitus with other specified complication, with long-term current use of insulin ([place])  ? [place], DO  ? 5 months ago Psychophysiological insomnia  ? [place], DO  ? ?  ?  ? ? ?  ?  ?  ? ?

## 2021-05-25 ENCOUNTER — Other Ambulatory Visit: Payer: Self-pay | Admitting: Family Medicine

## 2021-05-25 DIAGNOSIS — J432 Centrilobular emphysema: Secondary | ICD-10-CM

## 2021-05-26 DIAGNOSIS — I69951 Hemiplegia and hemiparesis following unspecified cerebrovascular disease affecting right dominant side: Secondary | ICD-10-CM | POA: Diagnosis not present

## 2021-05-27 DIAGNOSIS — I69351 Hemiplegia and hemiparesis following cerebral infarction affecting right dominant side: Secondary | ICD-10-CM | POA: Diagnosis not present

## 2021-05-27 DIAGNOSIS — Z79891 Long term (current) use of opiate analgesic: Secondary | ICD-10-CM | POA: Diagnosis not present

## 2021-05-27 DIAGNOSIS — M25561 Pain in right knee: Secondary | ICD-10-CM | POA: Diagnosis not present

## 2021-05-27 DIAGNOSIS — M961 Postlaminectomy syndrome, not elsewhere classified: Secondary | ICD-10-CM | POA: Diagnosis not present

## 2021-05-28 ENCOUNTER — Ambulatory Visit (INDEPENDENT_AMBULATORY_CARE_PROVIDER_SITE_OTHER): Payer: Medicare Other | Admitting: Family Medicine

## 2021-05-28 ENCOUNTER — Encounter: Payer: Self-pay | Admitting: Family Medicine

## 2021-05-28 VITALS — Ht 66.0 in | Wt 233.0 lb

## 2021-05-28 DIAGNOSIS — J029 Acute pharyngitis, unspecified: Secondary | ICD-10-CM | POA: Diagnosis not present

## 2021-05-28 DIAGNOSIS — J040 Acute laryngitis: Secondary | ICD-10-CM | POA: Diagnosis not present

## 2021-05-28 DIAGNOSIS — J432 Centrilobular emphysema: Secondary | ICD-10-CM | POA: Diagnosis not present

## 2021-05-28 DIAGNOSIS — R490 Dysphonia: Secondary | ICD-10-CM | POA: Diagnosis not present

## 2021-05-28 DIAGNOSIS — B370 Candidal stomatitis: Secondary | ICD-10-CM | POA: Diagnosis not present

## 2021-05-28 MED ORDER — FLUCONAZOLE 150 MG PO TABS: 150.0000 mg | ORAL_TABLET | ORAL | 1 refills | Status: DC

## 2021-05-28 NOTE — Telephone Encounter (Signed)
Requested Prescriptions  ?Pending Prescriptions Disp Refills  ?? albuterol (VENTOLIN HFA) 108 (90 Base) MCG/ACT inhaler [Pharmacy Med Name: ALBUTEROL HFA INH (200 PUFFS) 6.7GM] 6.7 g 5  ?  Sig: INHALE 2 PUFFS INTO THE LUNGS EVERY 4 HOURS AS NEEDED FOR WHEEZING OR SHORTNESS OF BREATH  ?  ? Pulmonology:  Beta Agonists 2 Passed - 05/25/2021 12:35 PM  ?  ?  Passed - Last BP in normal range  ?  BP Readings from Last 1 Encounters:  ?05/15/21 112/62  ?   ?  ?  Passed - Last Heart Rate in normal range  ?  Pulse Readings from Last 1 Encounters:  ?05/15/21 75  ?   ?  ?  Passed - Valid encounter within last 12 months  ?  Recent Outpatient Visits   ?      ? 1 week ago Type 2 diabetes mellitus with other specified complication, with long-term current use of insulin ([place])  ? [place], DO  ? 1 month ago Psychophysiological insomnia  ? [place], DO  ? 2 months ago Umbilical hernia with obstruction, without gangrene  ? [place], DO  ? 4 months ago Type 2 diabetes mellitus with other specified complication, with long-term current use of insulin ([place])  ? [place], DO  ? 6 months ago Psychophysiological insomnia  ? [place], DO  ?  ?  ?Future Appointments   ?        ? Today Parks Ranger, Devonne Doughty, DO [facility], [place]  ?  ? ?  ?  ?  ? ?

## 2021-05-28 NOTE — Progress Notes (Signed)
Virtual Visit via Telephone ?The purpose of this virtual visit is to provide medical care while limiting exposure to the novel coronavirus (COVID19) for both patient and office staff. ? ?Consent was obtained for phone visit:  Yes.   ?Answered questions that patient had about telehealth interaction:  Yes.   ?I discussed the limitations, risks, security and privacy concerns of performing an evaluation and management service by telephone. I also discussed with the patient that there may be a patient responsible charge related to this service. The patient expressed understanding and agreed to proceed. ? ?Patient Location: Home ?Provider Location: [facility] (Office) ? ?Participants in virtual visit: ?- Patient: [name] ?- CMA: Orinda Kenner, CMA ?- Provider: Dr Parks Ranger ? ?---------------------------------------------------------------------- ?Chief Complaint  ?Patient presents with  ? Sore Throat  ? ? ?S: Reviewed CMA documentation. I have called patient and gathered additional HPI as follows: ? ?Laryngitis / Hoarse Voice recurrent ?Oral Thrush ?Tobacco Abuse ?She is active smoker over 1ppd ?She has episodic hoarse voice 2-3 times per year w laryngitis ?Admits episodic oral thrush symptoms ?Sore throat interfering with her swallowing and breathing at times. ?She has COPD ?Has been treated for thrush with Diflucan a few months ago ? ?Prior ENT referral 10/2020 no show unable to keep apt. ? ?Denies any known or suspected exposure to person with or possibly with COVID19. ? ?Denies any fevers, chills, sweats, body ache, cough, shortness of breath, sinus pain or pressure, headache, abdominal pain, diarrhea ? ?Past Medical History:  ?Diagnosis Date  ? Anxiety   ? Cancer [facility])   ? a spot on liver and treated   ? Complication of anesthesia   ? restless,easily upset  ? COPD (chronic obstructive pulmonary disease) ([place])   ? Depression   ? Diverticulitis   ? Fatty liver   ? GERD (gastroesophageal  reflux disease)   ? Headache(784.0)   ? migraines  ? History of kidney stones   ? Hyperlipidemia   ? Hypertension   ? Pneumonia   ? Restless   ? Stroke [facility])   ? ?Social History  ? ?Tobacco Use  ? Smoking status: Every Day  ?  Packs/day: 1.00  ?  Years: 20.00  ?  Pack years: 20.00  ?  Types: Cigarettes  ? Smokeless tobacco: Never  ?Vaping Use  ? Vaping Use: Former  ?Substance Use Topics  ? Alcohol use: No  ? Drug use: Yes  ?  Types: Marijuana  ?  Comment: last smoked 2 days ago  8/4  ? ? ?Current Outpatient Medications:  ?  albuterol (VENTOLIN HFA) 108 (90 Base) MCG/ACT inhaler, INHALE 2 PUFFS INTO THE LUNGS EVERY 4 HOURS AS NEEDED FOR WHEEZING OR SHORTNESS OF BREATH, Disp: 6.7 g, Rfl: 5 ?  atorvastatin (LIPITOR) 40 MG tablet, Take 1 tablet (40 mg total) by mouth at bedtime., Disp: 90 tablet, Rfl: 3 ?  buPROPion (WELLBUTRIN XL) 150 MG 24 hr tablet, TAKE 1 TABLET(150 MG) BY MOUTH DAILY, Disp: 90 tablet, Rfl: 1 ?  citalopram (CELEXA) 40 MG tablet, TAKE 1 TABLET(40 MG) BY MOUTH DAILY, Disp: 90 tablet, Rfl: 0 ?  clopidogrel (PLAVIX) 75 MG tablet, Take 1 tablet (75 mg total) by mouth daily., Disp: 90 tablet, Rfl: 3 ?  dicyclomine (BENTYL) 20 MG tablet, Take 1 tablet (20 mg total) by mouth 4 (four) times daily -  before meals and at bedtime. As needed for abdominal pain cramping, Disp: 60 tablet, Rfl: 2 ?  fluconazole (DIFLUCAN) 150  MG tablet, Take 1 tablet (150 mg total) by mouth every other day. For approximately 2 weeks, or 7 doses. May repeat course in future if needed again., Disp: 7 tablet, Rfl: 1 ?  fluticasone (FLONASE) 50 MCG/ACT nasal spray, Place 2 sprays into both nostrils daily. Use for 4-6 weeks then stop and use seasonally or as needed., Disp: 16 g, Rfl: 3 ?  gabapentin (NEURONTIN) 600 MG tablet, Take 1 tablet (600 mg total) by mouth 4 (four) times daily., Disp: 360 tablet, Rfl: 0 ?  metFORMIN (GLUCOPHAGE) 1000 MG tablet, Take 1 tablet (1,000 mg total) by mouth 2 (two) times daily with a meal., Disp: 180  tablet, Rfl: 1 ?  metoCLOPramide (REGLAN) 5 MG tablet, Take 1 tablet (5 mg total) by mouth 3 (three) times daily before meals., Disp: 90 tablet, Rfl: 2 ?  MOUNJARO 2.5 MG/0.5ML Pen, Inject 2.5 mg into the skin once a week., Disp: 2 mL, Rfl: 0 ?  mupirocin ointment (BACTROBAN) 2 %, APPLY TOPICALLY TO THE AFFECTED AREA TWICE DAILY FOR UP TO 7 TO 10 DAYS AS NEEDED FOR SKIN INFECTION, Disp: 22 g, Rfl: 2 ?  nystatin (MYCOSTATIN) 100000 UNIT/ML suspension, Take 5 mLs (500,000 Units total) by mouth 4 (four) times daily. For 7-10 days, Disp: 60 mL, Rfl: 0 ?  NYSTATIN powder, APPLY TOPICALLY A SMALL AMOUNT TWICE DAILY AS NEEDED, Disp: 60 g, Rfl: 2 ?  ondansetron (ZOFRAN-ODT) 4 MG disintegrating tablet, Take 1 tablet (4 mg total) by mouth every 8 (eight) hours as needed for nausea or vomiting., Disp: 30 tablet, Rfl: 2 ?  oxyCODONE (ROXICODONE) 15 MG immediate release tablet, Take 15 mg by mouth every 6 (six) hours as needed., Disp: , Rfl:  ?  pantoprazole (PROTONIX) 40 MG tablet, TAKE 1 TABLET BY MOUTH EVERY DAY, Disp: 90 tablet, Rfl: 0 ?  pioglitazone (ACTOS) 45 MG tablet, Take 45 mg by mouth daily., Disp: , Rfl:  ?  QUEtiapine (SEROQUEL) 50 MG tablet, Take 1 tablet (50 mg total) by mouth at bedtime., Disp: 30 tablet, Rfl: 2 ?  RELISTOR 150 MG TABS, Take 3 tablets by mouth daily., Disp: , Rfl:  ?  topiramate (TOPAMAX) 100 MG tablet, TAKE 1 TABLET(100 MG) BY MOUTH TWICE DAILY, Disp: 180 tablet, Rfl: 1 ?  TRELEGY ELLIPTA 100-62.5-25 MCG/INH AEPB, Inhale 1 puff into the lungs daily., Disp: 60 each, Rfl: 11 ?  triamcinolone cream (KENALOG) 0.1 %, Apply 1 application. topically 2 (two) times daily., Disp: 30 g, Rfl: 2 ?  trimethoprim-polymyxin b (POLYTRIM) ophthalmic solution, Place 1 drop into both eyes every 6 (six) hours., Disp: 10 mL, Rfl: 0 ?  Vitamin D, Ergocalciferol, (DRISDOL) 50000 units CAPS capsule, Take 50,000 Units by mouth every 7 (seven) days. On Wednesday, Disp: , Rfl:  ?  ZUBSOLV 5.7-1.4 MG SUBL, Place 1 tablet  under the tongue 2 (two) times daily., Disp: , Rfl:  ? ? ?  05/15/2021  ?  1:59 PM 04/17/2021  ?  2:35 PM 03/04/2021  ?  2:30 PM  ?Depression screen PHQ 2/9  ?Decreased Interest '2 2 1  '$ ?Down, Depressed, Hopeless 0 0 1  ?PHQ - 2 Score '2 2 2  '$ ?Altered sleeping '3 3 2  '$ ?Tired, decreased energy '3 3 3  '$ ?Change in appetite '3 3 2  '$ ?Feeling bad or failure about yourself  0 0 0  ?Trouble concentrating '2 2 1  '$ ?Moving slowly or fidgety/restless 2 2 0  ?Suicidal thoughts 0 0 0  ?PHQ-9 Score 15 15  10  ?Difficult doing work/chores Very difficult Very difficult Not difficult at all  ? ? ? ?  05/15/2021  ?  1:59 PM 04/17/2021  ?  2:35 PM 01/18/2021  ?  1:11 PM 08/06/2020  ?  2:49 PM  ?GAD 7 : Generalized Anxiety Score  ?Nervous, Anxious, on Edge 2 2 0 1  ?Control/stop worrying 0 0 0 1  ?Worry too much - different things 2 1 0 1  ?Trouble relaxing '3 3 1 1  '$ ?Restless 3 3  0  ?Easily annoyed or irritable '3 3 1 '$ 0  ?Afraid - awful might happen 0 0 0 0  ?Total GAD 7 Score '13 12  4  '$ ?Anxiety Difficulty Somewhat difficult Not difficult at all Not difficult at all Not difficult at all  ? ? ?-------------------------------------------------------------------------- ?O: No physical exam performed due to remote telephone encounter. ? ?Lab results reviewed. ? ?Recent Results (from the past 2160 hour(s))  ?Microalbumin, urine     Status: None  ? Collection Time: 03/04/21  4:09 PM  ?Result Value Ref Range  ? Microalb, Ur 3.4 mg/dL  ?  Comment: Reference Range ?Not established ?  ? RAM    ?  Comment: . ?The ADA defines abnormalities in albumin ?excretion as follows: ?. ?Albuminuria Category         Result (mcg/mg creatinine) ?Marland Kitchen ?Normal to Mildly increased    <30 ?Moderately increased          30-299  ?Severely increased            > OR = 300 ?. ?The ADA recommends that at least two of three ?specimens collected within a 3-6 month period be ?abnormal before considering a patient to be ?within a diagnostic category. ?  ?Hemoglobin A1c     Status: Abnormal  ?  Collection Time: 05/15/21  1:51 PM  ?Result Value Ref Range  ? Hgb A1c MFr Bld 9.7 (H) <5.7 % of total Hgb  ?  Comment: For someone without known diabetes, a hemoglobin A1c ?value of 6.5% or greater indicates

## 2021-05-28 NOTE — Patient Instructions (Addendum)
Two locations for [facility] ? ?They should call you w apt ? ?[facility] ?[id number]  ?[place], [place] [address] ?Ph: [phone] ? ?[facility] ?[id number]  ?[place], [place] [address] ?Ph: [phone] ? ?Please schedule a Follow-up Appointment to: Return if symptoms worsen or fail to improve. ? ?If you have any other questions or concerns, please feel free to call the office or send a message through [place]. You may also schedule an earlier appointment if necessary. ? ?Additionally, you may be receiving a survey about your experience at our office within a few days to 1 week by e-mail or mail. We value your feedback. ? ?Nobie Putnam, DO ?[place] ?

## 2021-05-30 ENCOUNTER — Telehealth: Payer: Self-pay | Admitting: Podiatry

## 2021-05-30 NOTE — Telephone Encounter (Signed)
Pt called the vein & vascular center and was told that they needed another referral faxed over for her to be seen.  Please advise.

## 2021-05-31 ENCOUNTER — Other Ambulatory Visit: Payer: Self-pay | Admitting: Family Medicine

## 2021-05-31 ENCOUNTER — Other Ambulatory Visit: Payer: Self-pay | Admitting: Podiatry

## 2021-05-31 ENCOUNTER — Telehealth (INDEPENDENT_AMBULATORY_CARE_PROVIDER_SITE_OTHER): Payer: Self-pay

## 2021-05-31 ENCOUNTER — Telehealth: Payer: Self-pay | Admitting: Family Medicine

## 2021-05-31 DIAGNOSIS — F5104 Psychophysiologic insomnia: Secondary | ICD-10-CM

## 2021-05-31 DIAGNOSIS — I70229 Atherosclerosis of native arteries of extremities with rest pain, unspecified extremity: Secondary | ICD-10-CM

## 2021-05-31 DIAGNOSIS — E1169 Type 2 diabetes mellitus with other specified complication: Secondary | ICD-10-CM

## 2021-05-31 NOTE — Telephone Encounter (Signed)
Medication Refill - Medication: MOUNJARO 2.5 MG/0.5ML Pen [[id number]]   Has the patient contacted their pharmacy? Yes.   (Agent: If no, request that the patient contact the pharmacy for the refill. If patient does not wish to contact the pharmacy document the reason why and proceed with request.) (Agent: If yes, when and what did the pharmacy advise?)  Preferred Pharmacy (with phone number or street name):  [facility] [place], [place] [address]  [place] [address]  Phone: [phone] Fax: [phone]  Hours: Not open 24 hours   Has the patient been seen for an appointment in the last year OR does the patient have an upcoming appointment? Yes.    Agent: Please be advised that RX refills may take up to 3 business days. We ask that you follow-up with your pharmacy.

## 2021-05-31 NOTE — Telephone Encounter (Signed)
I just placed a new order for vascular referral in her chart.  Please print and fax over to vein and vascular.  Thanks, Dr. Amalia Hailey

## 2021-05-31 NOTE — Telephone Encounter (Signed)
LVM for pt TCB and schedule appt  np. abi. lab only. referred by evans, brent.

## 2021-05-31 NOTE — Telephone Encounter (Signed)
Pt is calling to let Dr. Raliegh Ip know that she is having to double up on her QUEtiapine (SEROQUEL) 50 MG tablet [[id number]]  Script. Pt reports that she she has one refill left at the pharmacy. However, that is only enough for 15 days. Please advise Cb- [phone]

## 2021-06-03 DIAGNOSIS — M961 Postlaminectomy syndrome, not elsewhere classified: Secondary | ICD-10-CM | POA: Diagnosis not present

## 2021-06-03 DIAGNOSIS — M545 Low back pain, unspecified: Secondary | ICD-10-CM | POA: Diagnosis not present

## 2021-06-03 DIAGNOSIS — Z79891 Long term (current) use of opiate analgesic: Secondary | ICD-10-CM | POA: Diagnosis not present

## 2021-06-03 DIAGNOSIS — I69351 Hemiplegia and hemiparesis following cerebral infarction affecting right dominant side: Secondary | ICD-10-CM | POA: Diagnosis not present

## 2021-06-03 MED ORDER — MOUNJARO 5 MG/0.5ML ~~LOC~~ SOAJ: 5.0000 mg | SUBCUTANEOUS | 2 refills | Status: DC

## 2021-06-03 MED ORDER — QUETIAPINE FUMARATE 100 MG PO TABS: 100.0000 mg | ORAL_TABLET | Freq: Every day | ORAL | 2 refills | Status: DC

## 2021-06-03 NOTE — Telephone Encounter (Signed)
Requested medication (s) are due for refill today:   Not sure   This was given as a sample  Requested medication (s) are on the active medication list:   Yes as a sample  Future visit scheduled:   Yes   Last ordered: 05/15/2021 2 ml, 0 refills  Returned because this was given as a sample.  Requested Prescriptions  Pending Prescriptions Disp Refills   MOUNJARO 2.5 MG/0.5ML Pen 2 mL 0    Sig: Inject 2.5 mg into the skin once a week.     Off-Protocol Failed - 05/31/2021  4:52 PM      Failed - Medication not assigned to a protocol, review manually.      Passed - Valid encounter within last 12 months    Recent Outpatient Visits           6 days ago Hoarseness of voice   [facility] [place], Devonne Doughty, DO   2 weeks ago Type 2 diabetes mellitus with other specified complication, with long-term current use of insulin [facility])   [facility] Olin Hauser, DO   1 month ago Psychophysiological insomnia   [place], DO   3 months ago Umbilical hernia with obstruction, without gangrene   [place], DO   4 months ago Type 2 diabetes mellitus with other specified complication, with long-term current use of insulin [facility])   [place], Devonne Doughty, [place]

## 2021-06-04 NOTE — Progress Notes (Deleted)
Office Note     CC:  Rest pain BLE Requesting Provider:  Nobie Putnam *  HPI: [name] is a 56 y.o. (24-Sep-1965) female presenting at the request of .Parks Ranger, Devonne Doughty, DO ***  The pt is *** on a statin for cholesterol management.  The pt is *** on a daily aspirin.   Other AC:  *** The pt is *** on medication for hypertension.   The pt is *** diabetic.  Tobacco hx:  ***  Past Medical History:  Diagnosis Date   Anxiety    Cancer ([place])    a spot on liver and treated    Complication of anesthesia    restless,easily upset   COPD (chronic obstructive pulmonary disease) (HCC)    Depression    Diverticulitis    Fatty liver    GERD (gastroesophageal reflux disease)    Headache(784.0)    migraines   History of kidney stones    Hyperlipidemia    Hypertension    Pneumonia    Restless    Stroke [facility])     Past Surgical History:  Procedure Laterality Date   ABDOMINAL HYSTERECTOMY     ACHILLES TENDON LENGTHENING Right 08/18/2017   Procedure: ACHILLES TENDON LENGTHENING;  Surgeon: Altamese Donahue, MD;  Location: [place];  Service: Orthopedics;  Laterality: Right;   ANTERIOR CERVICAL DECOMP/DISCECTOMY FUSION N/A 03/11/2012   Procedure: ANTERIOR CERVICAL DECOMPRESSION/DISCECTOMY FUSION 1 LEVEL;  Surgeon: Ophelia Charter, MD;  Location: [place] [facility];  Service: Neurosurgery;  Laterality: N/A;  Cervical five-six anterior cervical decompression with fusion interbody prothesis plating and bonegraft   BACK SURGERY     EXTERNAL FIXATION LEG Right 08/10/2017   Procedure: EXTERNAL FIXATION ANKLE;  Surgeon: Dereck Leep, MD;  Location: [facility]S;  Service: Orthopedics;  Laterality: Right;   EXTERNAL FIXATION REMOVAL Right 08/18/2017   Procedure: REMOVAL EXTERNAL FIXATION LEG;  Surgeon: Altamese [place], MD;  Location: [place];  Service: Orthopedics;  Laterality: Right;   EYE SURGERY     HERNIA REPAIR     JOINT REPLACEMENT     bil knees   ORIF ANKLE FRACTURE Right 08/18/2017    Procedure: OPEN REDUCTION INTERNAL FIXATION (ORIF) ANKLE FRACTURE;  Surgeon: Altamese [place], MD;  Location: [place];  Service: Orthopedics;  Laterality: Right;   REPLACEMENT TOTAL KNEE BILATERAL      Social History   Socioeconomic History   Marital status: Married    Spouse name: Not on file   Number of children: Not on file   Years of education: Not on file   Highest education level: Not on file  Occupational History   Not on file  Tobacco Use   Smoking status: Every Day    Packs/day: 1.00    Years: 20.00    Pack years: 20.00    Types: Cigarettes   Smokeless tobacco: Never  Vaping Use   Vaping Use: Former  Substance and Sexual Activity   Alcohol use: No   Drug use: Yes    Types: Marijuana    Comment: last smoked 2 days ago  8/4   Sexual activity: Not on file  Other Topics Concern   Not on file  Social History Narrative   Not on file   Social Determinants of Health   Financial Resource Strain: Medium Risk   Difficulty of Paying Living Expenses: Somewhat hard  Food Insecurity: No Food Insecurity   Worried About Running Out of Food in the Last Year: Never true   Ran Out  of Food in the Last Year: Never true  Transportation Needs: No Transportation Needs   Lack of Transportation (Medical): No   Lack of Transportation (Non-Medical): No  Physical Activity: Not on file  Stress: Stress Concern Present   Feeling of Stress : To some extent  Social Connections: Moderately Isolated   Frequency of Communication with Friends and Family: More than three times a week   Frequency of Social Gatherings with Friends and Family: More than three times a week   Attends Religious Services: Never   Marine scientist or Organizations: No   Attends Music therapist: Never   Marital Status: Married  Human resources officer Violence: Not on file   *** Family History  Problem Relation Age of Onset   Diabetes Mother    Hypertension Mother    Hypertension Father     Current  Outpatient Medications  Medication Sig Dispense Refill   albuterol (VENTOLIN HFA) 108 (90 Base) MCG/ACT inhaler INHALE 2 PUFFS INTO THE LUNGS EVERY 4 HOURS AS NEEDED FOR WHEEZING OR SHORTNESS OF BREATH 6.7 g 5   atorvastatin (LIPITOR) 40 MG tablet Take 1 tablet (40 mg total) by mouth at bedtime. 90 tablet 3   buPROPion (WELLBUTRIN XL) 150 MG 24 hr tablet TAKE 1 TABLET(150 MG) BY MOUTH DAILY 90 tablet 1   citalopram (CELEXA) 40 MG tablet TAKE 1 TABLET(40 MG) BY MOUTH DAILY 90 tablet 0   clopidogrel (PLAVIX) 75 MG tablet Take 1 tablet (75 mg total) by mouth daily. 90 tablet 3   dicyclomine (BENTYL) 20 MG tablet Take 1 tablet (20 mg total) by mouth 4 (four) times daily -  before meals and at bedtime. As needed for abdominal pain cramping 60 tablet 2   fluconazole (DIFLUCAN) 150 MG tablet Take 1 tablet (150 mg total) by mouth every other day. For approximately 2 weeks, or 7 doses. May repeat course in future if needed again. 7 tablet 1   fluticasone (FLONASE) 50 MCG/ACT nasal spray Place 2 sprays into both nostrils daily. Use for 4-6 weeks then stop and use seasonally or as needed. 16 g 3   gabapentin (NEURONTIN) 600 MG tablet Take 1 tablet (600 mg total) by mouth 4 (four) times daily. 360 tablet 0   metFORMIN (GLUCOPHAGE) 1000 MG tablet Take 1 tablet (1,000 mg total) by mouth 2 (two) times daily with a meal. 180 tablet 1   metoCLOPramide (REGLAN) 5 MG tablet Take 1 tablet (5 mg total) by mouth 3 (three) times daily before meals. 90 tablet 2   MOUNJARO 5 MG/0.5ML Pen Inject 5 mg into the skin once a week. 2 mL 2   mupirocin ointment (BACTROBAN) 2 % APPLY TOPICALLY TO THE AFFECTED AREA TWICE DAILY FOR UP TO 7 TO 10 DAYS AS NEEDED FOR SKIN INFECTION 22 g 2   nystatin (MYCOSTATIN) 100000 UNIT/ML suspension Take 5 mLs (500,000 Units total) by mouth 4 (four) times daily. For 7-10 days 60 mL 0   NYSTATIN powder APPLY TOPICALLY A SMALL AMOUNT TWICE DAILY AS NEEDED 60 g 2   ondansetron (ZOFRAN-ODT) 4 MG  disintegrating tablet Take 1 tablet (4 mg total) by mouth every 8 (eight) hours as needed for nausea or vomiting. 30 tablet 2   oxyCODONE (ROXICODONE) 15 MG immediate release tablet Take 15 mg by mouth every 6 (six) hours as needed.     pantoprazole (PROTONIX) 40 MG tablet TAKE 1 TABLET BY MOUTH EVERY DAY 90 tablet 0   pioglitazone (ACTOS) 45 MG tablet  Take 45 mg by mouth daily.     QUEtiapine (SEROQUEL) 100 MG tablet Take 1 tablet (100 mg total) by mouth at bedtime. 30 tablet 2   RELISTOR 150 MG TABS Take 3 tablets by mouth daily.     topiramate (TOPAMAX) 100 MG tablet TAKE 1 TABLET(100 MG) BY MOUTH TWICE DAILY 180 tablet 1   TRELEGY ELLIPTA 100-62.5-25 MCG/INH AEPB Inhale 1 puff into the lungs daily. 60 each 11   triamcinolone cream (KENALOG) 0.1 % Apply 1 application. topically 2 (two) times daily. 30 g 2   trimethoprim-polymyxin b (POLYTRIM) ophthalmic solution Place 1 drop into both eyes every 6 (six) hours. 10 mL 0   Vitamin D, Ergocalciferol, (DRISDOL) 50000 units CAPS capsule Take 50,000 Units by mouth every 7 (seven) days. On Wednesday     ZUBSOLV 5.7-1.4 MG SUBL Place 1 tablet under the tongue 2 (two) times daily.     No current facility-administered medications for this visit.    Allergies  Allergen Reactions   Tramadol Nausea And Vomiting and Hypertension   Augmentin [Amoxicillin-Pot Clavulanate] Rash     REVIEW OF SYSTEMS:  *** '[X]'$  denotes positive finding, '[ ]'$  denotes negative finding Cardiac  Comments:  Chest pain or chest pressure:    Shortness of breath upon exertion:    Short of breath when lying flat:    Irregular heart rhythm:        Vascular    Pain in calf, thigh, or hip brought on by ambulation:    Pain in feet at night that wakes you up from your sleep:     Blood clot in your veins:    Leg swelling:         Pulmonary    Oxygen at home:    Productive cough:     Wheezing:         Neurologic    Sudden weakness in arms or legs:     Sudden numbness in  arms or legs:     Sudden onset of difficulty speaking or slurred speech:    Temporary loss of vision in one eye:     Problems with dizziness:         Gastrointestinal    Blood in stool:     Vomited blood:         Genitourinary    Burning when urinating:     Blood in urine:        Psychiatric    Major depression:         Hematologic    Bleeding problems:    Problems with blood clotting too easily:        Skin    Rashes or ulcers:        Constitutional    Fever or chills:      PHYSICAL EXAMINATION:  There were no vitals filed for this visit.  General:  WDWN in NAD; vital signs documented above Gait: Not observed HENT: WNL, normocephalic Pulmonary: normal non-labored breathing , without wheezing Cardiac: {Desc; regular/irreg:14544} HR, bruit*** Abdomen: soft, NT, no masses Skin: {With/Without:20273} rashes Vascular Exam/Pulses:  Right Left  Radial {Exam; arterial pulse strength 0-4:30167} {Exam; arterial pulse strength 0-4:30167}  Ulnar {Exam; arterial pulse strength 0-4:30167} {Exam; arterial pulse strength 0-4:30167}  Femoral {Exam; arterial pulse strength 0-4:30167} {Exam; arterial pulse strength 0-4:30167}  Popliteal {Exam; arterial pulse strength 0-4:30167} {Exam; arterial pulse strength 0-4:30167}  DP {Exam; arterial pulse strength 0-4:30167} {Exam; arterial pulse strength 0-4:30167}  PT {Exam; arterial pulse strength 0-4:30167} {  Exam; arterial pulse strength 0-4:30167}   Extremities: {With/Without:20273} ischemic changes, {With/Without:20273} Gangrene , {With/Without:20273} cellulitis; {With/Without:20273} open wounds;  Musculoskeletal: no muscle wasting or atrophy  Neurologic: A&O X 3;  No focal weakness or paresthesias are detected Psychiatric:  The pt has {Desc; normal/abnormal:11317::"Normal"} affect.   Non-Invasive Vascular Imaging:   ***    ASSESSMENT/PLAN: [name] is a 56 y.o. female presenting with ***   ***   Broadus John,  MD [facility] [phone]

## 2021-06-04 NOTE — Telephone Encounter (Signed)
Patient has already been scheduled for V& V, [place], no prior authorization is required for study,ref # [id number]: [name]. Contacted V&V([place]) with this information.

## 2021-06-05 ENCOUNTER — Ambulatory Visit: Payer: Medicare Other | Admitting: Pharmacist

## 2021-06-05 DIAGNOSIS — J432 Centrilobular emphysema: Secondary | ICD-10-CM

## 2021-06-05 DIAGNOSIS — E1169 Type 2 diabetes mellitus with other specified complication: Secondary | ICD-10-CM

## 2021-06-05 NOTE — Patient Instructions (Signed)
Visit Information  Thank you for taking time to visit with me today. Please don't hesitate to contact me if I can be of assistance to you before our next scheduled telephone appointment.  Following are the goals we discussed today:   Goals Addressed             This Visit's Progress    Pharmacy Goals       Our goal A1c is less than 7%. This corresponds with fasting sugars less than 130 and 2 hour after meal sugars less than 180. Please check your blood sugar, keep a log of the results and have this for [place] to review during our next telephone appointment  Our goal bad cholesterol, or LDL, is less than 70 . This is why it is important to continue taking your atorvastatin  Feel free to call me with any questions or concerns. I look forward to our next call!   Wallace Cullens, PharmD, Para [place], CPP Clinical Pharmacist [facility] [phone]         Our next appointment is by telephone on 07/03/2021 at 1:30 PM  Please call the care guide team at [phone] if you need to cancel or reschedule your appointment.    Patient verbalizes understanding of instructions and care plan provided today and agrees to view in [place]. Active MyChart status and patient understanding of how to access instructions and care plan via MyChart confirmed with patient.

## 2021-06-05 NOTE — Chronic Care Management (AMB) (Signed)
Chronic Care Management CCM Pharmacy Note  06/05/2021 Name:  [name] MRN:  [id number] DOB:  10-25-1965   Subjective: [name] is an 56 y.o. year old female who is a primary patient of Olin Hauser, DO.  The CCM team was consulted for assistance with disease management and care coordination needs.    Engaged with patient by telephone for follow up visit for pharmacy case management and/or care coordination services.   Objective:  Medications Reviewed Today     Reviewed by Rennis Petty, RPH-CPP (Pharmacist) on 06/05/21 at 1154  Med List Status: <None>   Medication Order Taking? Sig Documenting Provider Last Dose Status Informant  albuterol (VENTOLIN HFA) 108 (90 Base) MCG/ACT inhaler [id number] Yes INHALE 2 PUFFS INTO THE LUNGS EVERY 4 HOURS AS NEEDED FOR WHEEZING OR SHORTNESS OF BREATH Karamalegos, Devonne Doughty, DO Taking Active   atorvastatin (LIPITOR) 40 MG tablet [id number] Yes Take 1 tablet (40 mg total) by mouth at bedtime. Olin Hauser, DO Taking Active   Buprenorphine HCl-Naloxone HCl 8-2 MG FILM [id number] Yes Place under the tongue 3 (three) times daily. [provider] Taking Active   buPROPion (WELLBUTRIN XL) 150 MG 24 hr tablet [id number] Yes TAKE 1 TABLET(150 MG) BY MOUTH DAILY Olin Hauser, DO Taking Active   citalopram (CELEXA) 40 MG tablet [id number] Yes TAKE 1 TABLET(40 MG) BY MOUTH DAILY [place], Devonne Doughty, DO Taking Active   clopidogrel (PLAVIX) 75 MG tablet [id number] Yes Take 1 tablet (75 mg total) by mouth daily. Olin Hauser, DO Taking Active   dicyclomine (BENTYL) 20 MG tablet [id number] Yes Take 1 tablet (20 mg total) by mouth 4 (four) times daily -  before meals and at bedtime. As needed for abdominal pain cramping Olin Hauser, DO Taking Active   fluconazole (DIFLUCAN) 150 MG tablet [id number] Yes Take 1 tablet (150 mg total) by mouth every other day. For approximately 2 weeks, or  7 doses. May repeat course in future if needed again. Olin Hauser, DO Taking Active   fluticasone (FLONASE) 50 MCG/ACT nasal spray [id number] Yes Place 2 sprays into both nostrils daily. Use for 4-6 weeks then stop and use seasonally or as needed. Olin Hauser, DO Taking Active   gabapentin (NEURONTIN) 600 MG tablet [id number] Yes Take 1 tablet (600 mg total) by mouth 4 (four) times daily. Olin Hauser, DO Taking Active   metFORMIN (GLUCOPHAGE) 1000 MG tablet [id number] Yes Take 1 tablet (1,000 mg total) by mouth 2 (two) times daily with a meal. Olin Hauser, DO Taking Active   MOUNJARO 5 MG/0.5ML Pen [id number] Yes Inject 5 mg into the skin once a week. Olin Hauser, DO Taking Active   mupirocin ointment (BACTROBAN) 2 % [id number] No APPLY TOPICALLY TO THE AFFECTED AREA TWICE DAILY FOR UP TO 7 TO 10 DAYS AS NEEDED FOR SKIN INFECTION  Patient not taking: Reported on 06/05/2021   Olin Hauser, DO Not Taking Active   Naldemedine Tosylate [facility]) 0.2 MG TABS [id number] Yes Take 1 tablet by mouth daily. [provider] Taking Active   NYSTATIN powder [id number] Yes APPLY TOPICALLY A SMALL AMOUNT TWICE DAILY AS NEEDED Parks Ranger, Devonne Doughty, DO Taking Active   ondansetron (ZOFRAN-ODT) 4 MG disintegrating tablet [id number] No Take 1 tablet (4 mg total) by mouth every 8 (eight) hours as needed for nausea or vomiting.  Patient not taking: Reported on 06/05/2021   Olin Hauser, DO Not Taking Active  pantoprazole (PROTONIX) 40 MG tablet [id number] Yes TAKE 1 TABLET BY MOUTH EVERY DAY Karamalegos, Devonne Doughty, DO Taking Active   pioglitazone (ACTOS) 45 MG tablet [id number] Yes Take 45 mg by mouth daily. [provider] Taking Active Pharmacy Records  QUEtiapine (SEROQUEL) 100 MG tablet [id number] Yes Take 1 tablet (100 mg total) by mouth at bedtime. Olin Hauser, DO Taking Active   topiramate (TOPAMAX) 100  MG tablet [id number] Yes TAKE 1 TABLET(100 MG) BY MOUTH TWICE DAILY Karamalegos, Alexander Lenna Sciara, DO Taking Active   TRELEGY ELLIPTA 100-62.5-25 MCG/INH AEPB [id number] Yes Inhale 1 puff into the lungs daily. Olin Hauser, DO Taking Active   triamcinolone cream (KENALOG) 0.1 % [id number] No Apply 1 application. topically 2 (two) times daily.  Patient not taking: Reported on 06/05/2021   Olin Hauser, DO Not Taking Active   Vitamin D, Ergocalciferol, (DRISDOL) 50000 units CAPS capsule [id number] No Take 50,000 Units by mouth every 7 (seven) days. On Wednesday  Patient not taking: Reported on 06/05/2021   [provider] Not Taking Active             Pertinent Labs:  Lab Results  Component Value Date   HGBA1C 9.7 (H) 05/15/2021   Lab Results  Component Value Date   CHOL 136 04/07/2017   HDL 31 (L) 04/07/2017   LDLCALC 67 04/07/2017   TRIG 191 (H) 04/07/2017   CHOLHDL 4.4 04/07/2017   Lab Results  Component Value Date   CREATININE 0.62 05/15/2021   BUN 13 05/15/2021   NA 139 05/15/2021   K 4.9 05/15/2021   CL 102 05/15/2021   CO2 29 05/15/2021    SDOH:  (Social Determinants of Health) assessments and interventions performed:    CCM Care Plan  Review of patient past medical history, allergies, medications, health status, including review of consultants reports, laboratory and other test data, was performed as part of comprehensive evaluation and provision of chronic care management services.   Care Plan : PharmD - Medication Adherence/T2DM Management  Updates made by Rennis Petty, RPH-CPP since 06/05/2021 12:00 AM     Problem: Disease Progression      Long-Range Goal: Disease Progression Prevented or Minimized   Start Date: 08/22/2020  Expected End Date: 11/20/2020  Recent Progress: On track  Priority: High  Note:   Current Barriers:  Unable to achieve control of A1C  Lack of blood sugar results for clinical team  Pharmacist  Clinical Goal(s):  Over the next 90 days, patient will achieve adherence to monitoring guidelines and medication adherence to achieve therapeutic efficacy through collaboration with PharmD and provider.    Interventions: 1:1 collaboration with Olin Hauser, DO regarding development and update of comprehensive plan of care as evidenced by provider attestation and co-signature Inter-disciplinary care team collaboration (see longitudinal plan of care) Perform chart review. Patient had Virtual Visit with PCP on 5/16 related to sore throat. Provider advised patient: Referral placed for ENT evaluation Rx sent for Diflucan 150 every other day dosing for 2 weeks Comprehensive medication review performed; medication list updated in electronic medical record Counsel on risk of anticholinergic side effects with umeclidinium (from Trelegy inhaler), quetiapine and dicyclomine, particularly when used in combination. Patient admits to dry mouth and constipation. However, reports at this time prefers to manage side effects rather than discuss medication change. Notes that she only uses dicyclomine as needed Notes that constipation has been a chronic problem since she has been on pain medication Reports pain management specialist  recently switched her from Relistor to Symproic (started taking today) Discuss importance of hydration Patient states will contact pain management specialist or PCP regarding constipation if needed Will collaborate with PCP Caution patient for risk of dizziness/sedation with buprenorphine, quetiapine, topiramate and gabapentin, particularly when taken in combination Patient to follow up with provider for new or worsening medical concerns/symptoms  Type 2 Diabetes: Uncontrolled; current treatment: Metformin 1000 mg twice daily Pioglitazone 45 mg daily Mounjaro 2.5 mg weekly on Wednesdays Reports tolerating well Reports picked up and plans to start new dose of Mounjaro 5  mg weekly today Previous therapies tried: Genevieve Norlander Reports recent fasting blood sugar readings ranging 152-164; this morning: 154 Encourage to have regular well-balanced meals throughout the day, while controlling carbohydrate portion sizes Have encouraged patient to try Glucerna or similar meal supplement for diabetic patients as recommended by PCP Current exercise: limited by right side deficit due to stroke and previous injury to right ankle from a fall Encourage patient to monitor blood sugars, keep a log of results and have this record to review at medical appointments  Patient Goals/Self-Care Activities Over the next 90 days, patient will:  - focus on medication adherence by using weekly pillbox - check glucose, document, and provide at future appointments - attend medical appointment as scheduled  Follow Up Plan: Telephone follow up appointment with care management team member scheduled for: 07/03/2021 at 1:30 PM      Wallace Cullens, PharmD, Para March, [place] [phone]

## 2021-06-06 ENCOUNTER — Encounter: Payer: Medicare Other | Admitting: Vascular Surgery

## 2021-06-06 ENCOUNTER — Encounter (HOSPITAL_COMMUNITY): Payer: Medicare Other

## 2021-06-06 ENCOUNTER — Other Ambulatory Visit: Payer: Self-pay

## 2021-06-06 DIAGNOSIS — I739 Peripheral vascular disease, unspecified: Secondary | ICD-10-CM

## 2021-06-11 DIAGNOSIS — E1169 Type 2 diabetes mellitus with other specified complication: Secondary | ICD-10-CM | POA: Diagnosis not present

## 2021-06-12 DIAGNOSIS — E1169 Type 2 diabetes mellitus with other specified complication: Secondary | ICD-10-CM

## 2021-06-12 DIAGNOSIS — Z794 Long term (current) use of insulin: Secondary | ICD-10-CM

## 2021-06-17 DIAGNOSIS — M25561 Pain in right knee: Secondary | ICD-10-CM | POA: Diagnosis not present

## 2021-06-17 DIAGNOSIS — I69351 Hemiplegia and hemiparesis following cerebral infarction affecting right dominant side: Secondary | ICD-10-CM | POA: Diagnosis not present

## 2021-06-17 DIAGNOSIS — Z79891 Long term (current) use of opiate analgesic: Secondary | ICD-10-CM | POA: Diagnosis not present

## 2021-06-17 DIAGNOSIS — M961 Postlaminectomy syndrome, not elsewhere classified: Secondary | ICD-10-CM | POA: Diagnosis not present

## 2021-06-20 ENCOUNTER — Other Ambulatory Visit: Payer: Self-pay | Admitting: Family Medicine

## 2021-06-23 ENCOUNTER — Other Ambulatory Visit: Payer: Self-pay | Admitting: Family Medicine

## 2021-06-23 DIAGNOSIS — J432 Centrilobular emphysema: Secondary | ICD-10-CM

## 2021-06-25 NOTE — Telephone Encounter (Signed)
Requested medication (s) are due for refill today:   Yes  Requested medication (s) are on the active medication list:   Yes  Future visit scheduled:   No   Seen 4 wks ago   Last ordered: 07/23/2020 #60, 11 refills  Returned because no protocol assigned to this medication   Requested Prescriptions  Pending Prescriptions Disp [place] 100-62.5-25 MCG/ACT AEPB [Pharmacy Med Name: TRELEGY ELLIPTA 100-62.5MCG INH 30P] 60 each 11    Sig: INHALE 1 PUFF INTO THE LUNGS DAILY     Off-Protocol Failed - 06/23/2021  1:04 PM      Failed - Medication not assigned to a protocol, review manually.      Passed - Valid encounter within last 12 months    Recent Outpatient Visits           4 weeks ago Hoarseness of voice   [facility] [place], Devonne Doughty, DO   1 month ago Type 2 diabetes mellitus with other specified complication, with long-term current use of insulin [facility])   [place], DO   2 months ago Psychophysiological insomnia   [place], DO   3 months ago Umbilical hernia with obstruction, without gangrene   [place], DO   5 months ago Type 2 diabetes mellitus with other specified complication, with long-term current use of insulin [facility])   [place], Devonne Doughty, [place]

## 2021-06-26 DIAGNOSIS — I69951 Hemiplegia and hemiparesis following unspecified cerebrovascular disease affecting right dominant side: Secondary | ICD-10-CM | POA: Diagnosis not present

## 2021-06-27 NOTE — Progress Notes (Deleted)
[facility] [place]  ASSESSMENT / PLAN: [name] is a 56 y.o. female with atherosclerosis of *** native arteries of *** causing {Chronic PAD levels:25303}.  Patient counseled {pad risk2:26283}  WIfI score calculated based on clinical exam and non-invasive measurements. {WIFIvascular:26096}  Recommend the following which can slow the progression of atherosclerosis and reduce the risk of major adverse cardiac / limb events:  Complete cessation from all tobacco products. Blood glucose control with goal A1c < 7%. Blood pressure control with goal blood pressure < 140/90 mmHg. Lipid reduction therapy with goal LDL-C <100 mg/dL (<70 if symptomatic from PAD).  Aspirin '81mg'$  PO QD.  *** Clopidogrel '75mg'$  PO QD. *** Rivaroxaban 2.'5mg'$  PO BID. *** Cilostozal '100mg'$  PO BID for intermittent claudication without evidence of heart failure. Atorvastatin 40-'80mg'$  PO QD (or other "high intensity" statin therapy). *** Daily walking to and past the point of discomfort. Patient counseled to keep a log of exercise distance. *** Adequate hydration (at least 2 liters / day) if patient's heart and kidney function is adequate.  Plan *** lower extremity angiogram with possible intervention via *** approach in cath lab ***.    CHIEF COMPLAINT: ***  HISTORY OF PRESENT ILLNESS: [name] is a 56 y.o. female ***  VASCULAR SURGICAL HISTORY: ***  VASCULAR RISK FACTORS: {FINDINGS; POSITIVE [phone]} history of stroke / transient ischemic attack. {FINDINGS; POSITIVE [phone]} history of coronary artery disease. *** history of PCI. *** history of CABG.  {FINDINGS; POSITIVE [phone]} history of diabetes mellitus. Last A1c ***. {FINDINGS; POSITIVE [phone]} history of smoking. *** actively smoking. {FINDINGS; POSITIVE [phone]} history of hypertension. *** drug regimen with *** control. {FINDINGS; POSITIVE [phone]}  history of chronic kidney disease.  Last GFR ***. CKD {stage:30421363}. {FINDINGS; POSITIVE [phone]} history of chronic obstructive pulmonary disease, treated with ***.  FUNCTIONAL STATUS: ECOG performance status: {findings; ecog performance status:31780} Ambulatory status: {TNHAmbulation:25868}  Past Medical History:  Diagnosis Date   Anxiety    Cancer ([place])    a spot on liver and treated    Complication of anesthesia    restless,easily upset   COPD (chronic obstructive pulmonary disease) (HCC)    Depression    Diverticulitis    Fatty liver    GERD (gastroesophageal reflux disease)    Headache(784.0)    migraines   History of kidney stones    Hyperlipidemia    Hypertension    Pneumonia    Restless    Stroke [facility])     Past Surgical History:  Procedure Laterality Date   ABDOMINAL HYSTERECTOMY     ACHILLES TENDON LENGTHENING Right 08/18/2017   Procedure: ACHILLES TENDON LENGTHENING;  Surgeon: Altamese Chamita, MD;  Location: [place];  Service: Orthopedics;  Laterality: Right;   ANTERIOR CERVICAL DECOMP/DISCECTOMY FUSION N/A 03/11/2012   Procedure: ANTERIOR CERVICAL DECOMPRESSION/DISCECTOMY FUSION 1 LEVEL;  Surgeon: Ophelia Charter, MD;  Location: [facility];  Service: Neurosurgery;  Laterality: N/A;  Cervical five-six anterior cervical decompression with fusion interbody prothesis plating and bonegraft   BACK SURGERY     EXTERNAL FIXATION LEG Right 08/10/2017   Procedure: EXTERNAL FIXATION ANKLE;  Surgeon: Dereck Leep, MD;  Location: [facility];  Service: Orthopedics;  Laterality: Right;   EXTERNAL FIXATION REMOVAL Right 08/18/2017   Procedure: REMOVAL EXTERNAL FIXATION LEG;  Surgeon: Altamese Mason, MD;  Location: [place];  Service: Orthopedics;  Laterality: Right;   EYE SURGERY     HERNIA REPAIR     JOINT REPLACEMENT     bil  knees   ORIF ANKLE FRACTURE Right 08/18/2017   Procedure: OPEN REDUCTION INTERNAL FIXATION (ORIF) ANKLE FRACTURE;  Surgeon: Altamese Chambersburg, MD;   Location: [facility];  Service: Orthopedics;  Laterality: Right;   REPLACEMENT TOTAL KNEE BILATERAL      Family History  Problem Relation Age of Onset   Diabetes Mother    Hypertension Mother    Hypertension Father     Social History   Socioeconomic History   Marital status: Married    Spouse name: Not on file   Number of children: Not on file   Years of education: Not on file   Highest education level: Not on file  Occupational History   Not on file  Tobacco Use   Smoking status: Every Day    Packs/day: 1.00    Years: 20.00    Total pack years: 20.00    Types: Cigarettes   Smokeless tobacco: Never  Vaping Use   Vaping Use: Former  Substance and Sexual Activity   Alcohol use: No   Drug use: Yes    Types: Marijuana    Comment: last smoked 2 days ago  8/4   Sexual activity: Not on file  Other Topics Concern   Not on file  Social History Narrative   Not on file   Social Determinants of Health   Financial Resource Strain: Medium Risk (03/04/2021)   Overall Financial Resource Strain (CARDIA)    Difficulty of Paying Living Expenses: Somewhat hard  Food Insecurity: No Food Insecurity (03/04/2021)   Hunger Vital Sign    Worried About Running Out of Food in the Last Year: Never true    Ran Out of Food in the Last Year: Never true  Transportation Needs: No Transportation Needs (03/04/2021)   PRAPARE - Hydrologist (Medical): No    Lack of Transportation (Non-Medical): No  Physical Activity: Not on file  Stress: Stress Concern Present (03/04/2021)   Ch[place]ion    Feeling of Stress : To some extent  Social Connections: Moderately Isolated (03/04/2021)   Social Connection and Isolation Panel [NHANES]    Frequency of Communication with Friends and Family: More than three times a week    Frequency of Social Gatherings with Friends and Family: More than three times a week    Attends  Religious Services: Never    Marine scientist or Organizations: No    Attends Music therapist: Never    Marital Status: Married  Human resources officer Violence: Not on file    Allergies  Allergen Reactions   Tramadol Nausea And Vomiting and Hypertension   Augmentin [Amoxicillin-Pot Clavulanate] Rash    Current Outpatient Medications  Medication Sig Dispense Refill   albuterol (VENTOLIN HFA) 108 (90 Base) MCG/ACT inhaler INHALE 2 PUFFS INTO THE LUNGS EVERY 4 HOURS AS NEEDED FOR WHEEZING OR SHORTNESS OF BREATH 6.7 g 5   atorvastatin (LIPITOR) 40 MG tablet Take 1 tablet (40 mg total) by mouth at bedtime. 90 tablet 3   Buprenorphine HCl-Naloxone HCl 8-2 MG FILM Place under the tongue 3 (three) times daily.     buPROPion (WELLBUTRIN XL) 150 MG 24 hr tablet TAKE 1 TABLET(150 MG) BY MOUTH DAILY 90 tablet 1   citalopram (CELEXA) 40 MG tablet TAKE 1 TABLET(40 MG) BY MOUTH DAILY 90 tablet 0   clopidogrel (PLAVIX) 75 MG tablet Take 1 tablet (75 mg total) by mouth daily. 90 tablet 3   dicyclomine (  BENTYL) 20 MG tablet Take 1 tablet (20 mg total) by mouth 4 (four) times daily -  before meals and at bedtime. As needed for abdominal pain cramping 60 tablet 2   fluconazole (DIFLUCAN) 150 MG tablet Take 1 tablet (150 mg total) by mouth every other day. For approximately 2 weeks, or 7 doses. May repeat course in future if needed again. 7 tablet 1   fluticasone (FLONASE) 50 MCG/ACT nasal spray Place 2 sprays into both nostrils daily. Use for 4-6 weeks then stop and use seasonally or as needed. 16 g 3   gabapentin (NEURONTIN) 600 MG tablet Take 1 tablet (600 mg total) by mouth 4 (four) times daily. 360 tablet 0   metFORMIN (GLUCOPHAGE) 1000 MG tablet Take 1 tablet (1,000 mg total) by mouth 2 (two) times daily with a meal. 180 tablet 1   MOUNJARO 5 MG/0.5ML Pen Inject 5 mg into the skin once a week. 2 mL 2   mupirocin ointment (BACTROBAN) 2 % APPLY TOPICALLY TO THE AFFECTED AREA TWICE DAILY FOR  UP TO 7 TO 10 DAYS AS NEEDED FOR SKIN INFECTION (Patient not taking: Reported on 06/05/2021) 22 g 2   Naldemedine Tosylate (SYMPROIC) 0.2 MG TABS Take 1 tablet by mouth daily.     NYSTATIN powder APPLY TOPICALLY A SMALL AMOUNT TWICE DAILY AS NEEDED 60 g 2   ondansetron (ZOFRAN-ODT) 4 MG disintegrating tablet Take 1 tablet (4 mg total) by mouth every 8 (eight) hours as needed for nausea or vomiting. (Patient not taking: Reported on 06/05/2021) 30 tablet 2   pantoprazole (PROTONIX) 40 MG tablet TAKE 1 TABLET BY MOUTH EVERY DAY 90 tablet 0   pioglitazone (ACTOS) 45 MG tablet Take 45 mg by mouth daily.     QUEtiapine (SEROQUEL) 100 MG tablet Take 1 tablet (100 mg total) by mouth at bedtime. 30 tablet 2   topiramate (TOPAMAX) 100 MG tablet TAKE 1 TABLET(100 MG) BY MOUTH TWICE DAILY 180 tablet 1   TRELEGY ELLIPTA 100-62.5-25 MCG/ACT AEPB INHALE 1 PUFF INTO THE LUNGS DAILY 60 each 11   triamcinolone cream (KENALOG) 0.1 % Apply 1 application. topically 2 (two) times daily. (Patient not taking: Reported on 06/05/2021) 30 g 2   Vitamin D, Ergocalciferol, (DRISDOL) 50000 units CAPS capsule Take 50,000 Units by mouth every 7 (seven) days. On Wednesday (Patient not taking: Reported on 06/05/2021)     No current facility-administered medications for this visit.    PHYSICAL EXAM There were no vitals filed for this visit.  Constitutional: *** appearing. *** distress. Appears *** nourished.  Neurologic: CN ***. *** focal findings. *** sensory loss. Psychiatric: *** Mood and affect symmetric and appropriate. Eyes: *** No icterus. No conjunctival pallor. Ears, nose, throat: *** mucous membranes moist. Midline trachea.  Cardiac: *** rate and rhythm.  Respiratory: *** unlabored. Abdominal: *** soft, non-tender, non-distended.  Peripheral vascular: *** Extremity: *** edema. *** cyanosis. *** pallor.  Skin: *** gangrene. *** ulceration.  Lymphatic: *** Stemmer's sign. *** palpable lymphadenopathy.  PERTINENT  LABORATORY AND RADIOLOGIC DATA  Most recent CBC    Latest Ref Rng & Units 08/18/2017    1:19 PM 08/18/2017    7:07 AM 08/09/2017    2:15 AM  CBC  WBC 4.0 - 10.5 K/uL  9.5  11.7   Hemoglobin 12.0 - 15.0 g/dL 15.3  14.1  14.2   Hematocrit 36.0 - 46.0 % 45.0  44.2  42.5   Platelets 150 - 400 K/uL  316  285  Most recent CMP    Latest Ref Rng & Units 05/15/2021    1:51 PM 02/21/2021    2:56 PM 09/23/2017    4:27 PM  CMP  Glucose 65 - 139 mg/dL 261   306   BUN 7 - 25 mg/dL 13   11   Creatinine 0.50 - 1.03 mg/dL 0.62  0.70  0.71   Sodium 135 - 146 mmol/L 139   133   Potassium 3.5 - 5.3 mmol/L 4.9   4.0   Chloride 98 - 110 mmol/L 102   98   CO2 20 - 32 mmol/L 29   27   Calcium 8.6 - 10.4 mg/dL 8.6   9.2     Renal function CrCl cannot be calculated (Patient's most recent lab result is older than the maximum 21 days allowed.).  Hgb A1c MFr Bld (% of total Hgb)  Date Value  05/15/2021 9.7 (H)    Ldl Cholesterol, Calc  Date Value Ref Range Status  10/07/2012 127 (H) 0 - 100 mg/dL Final   LDL Cholesterol  Date Value Ref Range Status  04/07/2017 67 0 - 99 mg/dL Final    Comment:           Total Cholesterol/HDL:CHD Risk Coronary Heart Disease Risk Table                     Men   Women  1/2 Average Risk   3.4   3.3  Average Risk       5.0   4.4  2 X Average Risk   9.6   7.1  3 X Average Risk  23.4   11.0        Use the calculated Patient Ratio above and the CHD Risk Table to determine the patient's CHD Risk.        ATP III CLASSIFICATION (LDL):  <100     mg/dL   Optimal  100-129  mg/dL   Near or Above                    Optimal  130-159  mg/dL   Borderline  160-189  mg/dL   High  >190     mg/dL   Very High Performed at [facility] [address], [place], [place] [address]      Vascular Imaging: ***  Yevonne Aline. Stanford Breed, MD [facility] Phone Number: [phone] 06/27/2021 12:24 PM  Total time spent on preparing  this encounter including chart review, data review, collecting history, examining the patient, coordinating care for this {tnhtimebilling:26202}  Portions of this report may have been transcribed using voice recognition software.  Every effort has been made to ensure accuracy; however, inadvertent computerized transcription errors may still be present.

## 2021-06-28 ENCOUNTER — Ambulatory Visit (HOSPITAL_COMMUNITY)
Admission: RE | Admit: 2021-06-28 | Discharge: 2021-06-28 | Disposition: A | Discharge status: Home / Self Care | Payer: Medicare Other | Source: Ambulatory Visit | Attending: Vascular Surgery | Admitting: Vascular Surgery

## 2021-06-28 ENCOUNTER — Encounter: Payer: Medicare Other | Admitting: Vascular Surgery

## 2021-06-28 DIAGNOSIS — I739 Peripheral vascular disease, unspecified: Secondary | ICD-10-CM

## 2021-07-03 ENCOUNTER — Telehealth: Payer: Medicare Other

## 2021-07-03 ENCOUNTER — Telehealth: Payer: Self-pay | Admitting: Pharmacist

## 2021-07-03 NOTE — Telephone Encounter (Signed)
  Chronic Care Management   Outreach Note  07/03/2021 Name: [name] MRN: [id number] DOB: [birth year]  Referred by: Olin Hauser, DO Reason for referral : No chief complaint on file.   Was unable to reach patient via telephone today and have left HIPAA compliant voicemail asking patient to return my call.    Follow Up Plan: Will collaborate with Care Guide to outreach to schedule follow up with me  Wallace Cullens, PharmD, [facility] [phone]

## 2021-07-04 ENCOUNTER — Telehealth: Payer: Self-pay

## 2021-07-04 NOTE — Chronic Care Management (AMB) (Signed)
  Chronic Care Management Note  07/04/2021 Name: OYINKANSOLA TRUAX MRN: [id number] DOB: October 06, 1965  [name] is a 56 y.o. year old female who is a primary care patient of Olin Hauser, DO and is actively engaged with the care management team. I reached out to [name] by phone today to assist with re-scheduling a follow up visit with the Pharmacist  Follow up plan: Telephone appointment with care management team member scheduled for:07/17/2021  Noreene Larsson, [place], [place], [place] [address] Direct Dial: [phone] [email or web address]

## 2021-07-04 NOTE — Chronic Care Management (AMB) (Signed)
  Chronic Care Management Note  07/04/2021 Name: [name] MRN: [id number] DOB: [birth year]  [name] is a 56 y.o. year old female who is a primary care patient of Olin Hauser, DO and is actively engaged with the care management team. I reached out to [name] by phone today to assist with re-scheduling a follow up visit with the Pharmacist  Follow up plan: Unsuccessful telephone outreach attempt made. A HIPAA compliant phone message was left for the patient providing contact information and requesting a return call.  The care management team will reach out to the patient again over the next 7 days.  If patient returns call to provider office, please advise to call [facility]  at [place], [place], [place], [place] [address] Direct Dial: [phone] [name] Website: [email or web address]

## 2021-07-08 DIAGNOSIS — M961 Postlaminectomy syndrome, not elsewhere classified: Secondary | ICD-10-CM | POA: Diagnosis not present

## 2021-07-08 DIAGNOSIS — I69351 Hemiplegia and hemiparesis following cerebral infarction affecting right dominant side: Secondary | ICD-10-CM | POA: Diagnosis not present

## 2021-07-09 ENCOUNTER — Telehealth: Payer: Self-pay

## 2021-07-09 NOTE — Chronic Care Management (AMB) (Signed)
  Chronic Care Management Note  07/09/2021 Name: [name] MRN: [id number] DOB: 13-Nov-1965  [name] is a 56 y.o. year old female who is a primary care patient of Olin Hauser, DO and is actively engaged with the care management team. I reached out to [name] by phone today to assist with re-scheduling a follow up visit with the Pharmacist  Follow up plan: Unsuccessful telephone outreach attempt made. A HIPAA compliant phone message was left for the patient providing contact information and requesting a return call.  The care management team will reach out to the patient again over the next 7 days.  If patient returns call to provider office, please advise to call [facility]  at [place], [place], [place], [place] [address] Direct Dial: [phone] [email or web address] Website: [email or web address]

## 2021-07-10 ENCOUNTER — Other Ambulatory Visit: Payer: Self-pay | Admitting: Family Medicine

## 2021-07-10 DIAGNOSIS — J321 Chronic frontal sinusitis: Secondary | ICD-10-CM

## 2021-07-10 DIAGNOSIS — J432 Centrilobular emphysema: Secondary | ICD-10-CM

## 2021-07-10 DIAGNOSIS — H6983 Other specified disorders of Eustachian tube, bilateral: Secondary | ICD-10-CM

## 2021-07-10 DIAGNOSIS — H6993 Unspecified Eustachian tube disorder, bilateral: Secondary | ICD-10-CM

## 2021-07-10 DIAGNOSIS — F5104 Psychophysiologic insomnia: Secondary | ICD-10-CM

## 2021-07-10 DIAGNOSIS — G43711 Chronic migraine without aura, intractable, with status migrainosus: Secondary | ICD-10-CM

## 2021-07-10 DIAGNOSIS — F331 Major depressive disorder, recurrent, moderate: Secondary | ICD-10-CM

## 2021-07-11 MED ORDER — QUETIAPINE FUMARATE 100 MG PO TABS: 100.0000 mg | ORAL_TABLET | Freq: Every day | ORAL | 1 refills | Status: DC

## 2021-07-11 NOTE — Telephone Encounter (Signed)
Requested medication (s) are due for refill today: yes  Requested medication (s) are on the active medication list: yes  Last refill:  05/27/21,05/15/21  Future visit scheduled: yes  Notes to clinic:  Unable to refill per protocol, cannot delegate.Prescriptions for Albuterol and Fluticasone maybe too soon to refill, routing for review.     Requested Prescriptions  Pending Prescriptions Disp Refills   QUEtiapine (SEROQUEL) 50 MG tablet [Pharmacy Med Name: QUEtiapine Fumarate 50 MG Oral Tablet] 30 tablet 11    Sig: TAKE 1 TABLET BY MOUTH AT  BEDTIME     Not Delegated - Psychiatry:  Antipsychotics - Second Generation (Atypical) - quetiapine Failed - 07/10/2021  6:03 PM      Failed - This refill cannot be delegated      Failed - TSH in normal range and within 360 days    TSH  Date Value Ref Range Status  08/09/2017 1.086 0.350 - 4.500 uIU/mL Final    Comment:    Performed by a 3rd Generation assay with a functional sensitivity of <=0.01 uIU/mL. Performed at [facility], [address] [place], [place] [address]          Failed - Lipid Panel in normal range within the last 12 months    Cholesterol  Date Value Ref Range Status  04/07/2017 136 0 - 200 mg/dL Final  10/07/2012 207 (H) 0 - 200 mg/dL Final   Ldl Cholesterol, Calc  Date Value Ref Range Status  10/07/2012 127 (H) 0 - 100 mg/dL Final   LDL Cholesterol  Date Value Ref Range Status  04/07/2017 67 0 - 99 mg/dL Final    Comment:           Total Cholesterol/HDL:CHD Risk Coronary Heart Disease Risk Table                     Men   Women  1/2 Average Risk   3.4   3.3  Average Risk       5.0   4.4  2 X Average Risk   9.6   7.1  3 X Average Risk  23.4   11.0        Use the calculated Patient Ratio above and the CHD Risk Table to determine the patient's CHD Risk.        ATP III CLASSIFICATION (LDL):  <100     mg/dL   Optimal  100-129  mg/dL   Near or Above                    Optimal  130-159  mg/dL    Borderline  160-189  mg/dL   High  >190     mg/dL   Very High Performed at [facility] [address] [place], [place] [address]    HDL Cholesterol  Date Value Ref Range Status  10/07/2012 25 (L) 40 - 60 mg/dL Final   HDL  Date Value Ref Range Status  04/07/2017 31 (L) >40 mg/dL Final   Triglycerides  Date Value Ref Range Status  04/07/2017 191 (H) <150 mg/dL Final  10/07/2012 273 (H) 0 - 200 mg/dL Final         Failed - CBC within normal limits and completed in the last 12 months    WBC  Date Value Ref Range Status  08/18/2017 9.5 4.0 - 10.5 K/uL Final   RBC  Date Value Ref Range Status  08/18/2017 4.94 3.87 - 5.11 MIL/uL  Final   Hemoglobin  Date Value Ref Range Status  08/18/2017 15.3 (H) 12.0 - 15.0 g/dL Final   HGB  Date Value Ref Range Status  09/22/2013 14.6 12.0 - 16.0 g/dL Final   HCT  Date Value Ref Range Status  08/18/2017 45.0 36.0 - 46.0 % Final  09/22/2013 44.3 35.0 - 47.0 % Final   MCHC  Date Value Ref Range Status  08/18/2017 31.9 30.0 - 36.0 g/dL Final   [facility]  Date Value Ref Range Status  08/18/2017 28.5 26.0 - 34.0 pg Final   MCV  Date Value Ref Range Status  08/18/2017 89.5 78.0 - 100.0 fL Final  09/22/2013 90 80 - 100 fL Final   No results found for: "PLTCOUNTKUC", "LABPLAT", "POCPLA" RDW  Date Value Ref Range Status  08/18/2017 13.8 11.5 - 15.5 % Final  09/22/2013 13.8 11.5 - 14.5 % Final         Failed - CMP within normal limits and completed in the last 12 months    Albumin  Date Value Ref Range Status  08/18/2017 3.0 (L) 3.5 - 5.0 g/dL Final  09/22/2013 2.6 (L) 3.4 - 5.0 g/dL Final   Alkaline Phosphatase  Date Value Ref Range Status  08/18/2017 59 38 - 126 U/L Final  09/22/2013 78 Unit/L Final    Comment:    46-116 NOTE: New Reference Range 08/02/13    ALT  Date Value Ref Range Status  08/18/2017 16 0 - 44 U/L Final   SGPT (ALT)  Date Value Ref Range Status  09/22/2013 16 U/L Final    Comment:     14-63 NOTE: New Reference Range 08/02/13    AST  Date Value Ref Range Status  08/18/2017 22 15 - 41 U/L Final   SGOT(AST)  Date Value Ref Range Status  09/22/2013 17 15 - 37 Unit/L Final   BUN  Date Value Ref Range Status  05/15/2021 13 7 - 25 mg/dL Final  09/22/2013 11 7 - 18 mg/dL Final   Calcium  Date Value Ref Range Status  05/15/2021 8.6 8.6 - 10.4 mg/dL Final   Calcium, Total  Date Value Ref Range Status  09/22/2013 8.3 (L) 8.5 - 10.1 mg/dL Final   Calcium, Ion  Date Value Ref Range Status  04/07/2017 1.17 1.15 - 1.40 mmol/L Final   CO2  Date Value Ref Range Status  05/15/2021 29 20 - 32 mmol/L Final   Co2  Date Value Ref Range Status  09/22/2013 27 21 - 32 mmol/L Final   TCO2  Date Value Ref Range Status  04/07/2017 29 22 - 32 mmol/L Final   Creat  Date Value Ref Range Status  05/15/2021 0.62 0.50 - 1.03 mg/dL Final   Glucose  Date Value Ref Range Status  09/22/2013 335 (H) 65 - 99 mg/dL Final   Glucose, Bld  Date Value Ref Range Status  05/15/2021 261 (H) 65 - 139 mg/dL Final    Comment:    .        Non-fasting reference interval .    Glucose-Capillary  Date Value Ref Range Status  08/18/2017 120 (H) 70 - 99 mg/dL Final   Potassium  Date Value Ref Range Status  05/15/2021 4.9 3.5 - 5.3 mmol/L Final  09/22/2013 3.8 3.5 - 5.1 mmol/L Final   Sodium  Date Value Ref Range Status  05/15/2021 139 135 - 146 mmol/L Final  09/22/2013 135 (L) 136 - 145 mmol/L Final   Total Bilirubin  Date Value Ref  Range Status  08/18/2017 0.9 0.3 - 1.2 mg/dL Final   Bilirubin,Total  Date Value Ref Range Status  09/22/2013 0.2 0.2 - 1.0 mg/dL Final   Bilirubin, Direct  Date Value Ref Range Status  08/28/2012 < 0.1 0.00 - 0.20 mg/dL Final   Protein, ur  Date Value Ref Range Status  08/18/2017 NEGATIVE NEGATIVE mg/dL Final   Total Protein  Date Value Ref Range Status  08/18/2017 7.0 6.5 - 8.1 g/dL Final  09/22/2013 7.2 6.4 - 8.2 g/dL Final    EGFR (African American)  Date Value Ref Range Status  09/22/2013 >60  Final   GFR calc Af Amer  Date Value Ref Range Status  08/18/2017 >60 >60 mL/min Final    Comment:    (NOTE) The eGFR has been calculated using the CKD EPI equation. This calculation has not been validated in all clinical situations. eGFR's persistently <60 mL/min signify possible Chronic Kidney Disease.    GFR  Date Value Ref Range Status  09/23/2017 91.12 >60.00 mL/min Final   eGFR  Date Value Ref Range Status  05/15/2021 104 > OR = 60 mL/min/1.25m Final    Comment:    The eGFR is based on the CKD-EPI 2021 equation. To calculate  the new eGFR from a previous Creatinine or Cystatin C result, go to https://www.kidney.org/professionals/ kdoqi/gfr%5Fcalculator    EGFR (Non-African Amer.)  Date Value Ref Range Status  09/22/2013 >60  Final    Comment:    eGFR values <612mmin/1.73 m2 may be an indication of chronic kidney disease (CKD). Calculated eGFR is useful in patients with stable renal function. The eGFR calculation will not be reliable in acutely ill patients when serum creatinine is changing rapidly. It is not useful in  patients on dialysis. The eGFR calculation may not be applicable to patients at the low and high extremes of body sizes, pregnant women, and vegetarians.    GFR calc non Af Amer  Date Value Ref Range Status  08/18/2017 >60 >60 mL/min Final         Passed - Completed PHQ-2 or PHQ-9 in the last 360 days      Passed - Last BP in normal range    BP Readings from Last 1 Encounters:  05/15/21 112/62         Passed - Last Heart Rate in normal range    Pulse Readings from Last 1 Encounters:  05/15/21 75         Passed - Valid encounter within last 6 months    Recent Outpatient Visits           1 month ago Hoarseness of voice   SoWalkerAlDevonne DoughtyDO   1 month ago Type 2 diabetes mellitus with other specified complication, with  long-term current use of insulin ([facility]   2 months ago Psychophysiological insomnia   So[place]DO   4 months ago Umbilical hernia with obstruction, without gangrene   So[place]DO   5 months ago Type 2 diabetes mellitus with other specified complication, with long-term current use of insulin ([place]  SoWillardAlDevonne DoughtyDO       Future Appointments             In 6 days Ka[place] RangerAlDevonne DoughtyDO [facility]  fluticasone (FLONASE) 50 MCG/ACT nasal spray [Pharmacy Med Name: Fluticasone Propionate 50 MCG/ACT Nasal Suspension] 48 g 0    Sig: USE 2 SPRAYS IN BOTH NOSTRILS  DAILY . USE FOR 4 TO 6 WEEKS,  THEN STOP AND USE SEASONALLY OR  AS NEEDED     Ear, Nose, and Throat: Nasal Preparations - Corticosteroids Passed - 07/10/2021  6:03 PM      Passed - Valid encounter within last 12 months    Recent Outpatient Visits           1 month ago Hoarseness of voice   [facility] [place], Devonne Doughty, DO   1 month ago Type 2 diabetes mellitus with other specified complication, with long-term current use of insulin ([place])   [facility] Olin Hauser, DO   2 months ago Psychophysiological insomnia   [place], DO   4 months ago Umbilical hernia with obstruction, without gangrene   [place], DO   5 months ago Type 2 diabetes mellitus with other specified complication, with long-term current use of insulin ([place])   [place], DO       Future Appointments             In 6 days [place], Devonne Doughty, DO [facility]             albuterol (VENTOLIN HFA) 108 (90 Base) MCG/ACT  inhaler [Pharmacy Med Name: ALBUTEROL HFA 90MCG/ACT (PV)] 26.8 g 4    Sig: USE 2 INHALATIONS BY MOUTH EVERY 4 HOURS AS NEEDED FOR WHEEZING  OR SHORTNESS OF BREATH     Pulmonology:  Beta Agonists 2 Passed - 07/10/2021  6:03 PM      Passed - Last BP in normal range    BP Readings from Last 1 Encounters:  05/15/21 112/62         Passed - Last Heart Rate in normal range    Pulse Readings from Last 1 Encounters:  05/15/21 75         Passed - Valid encounter within last 12 months    Recent Outpatient Visits           1 month ago Hoarseness of voice   [place], Devonne Doughty, DO   1 month ago Type 2 diabetes mellitus with other specified complication, with long-term current use of insulin ([place])   [facility] [place], Devonne Doughty, DO   2 months ago Psychophysiological insomnia   [place], DO   4 months ago Umbilical hernia with obstruction, without gangrene   [place], DO   5 months ago Type 2 diabetes mellitus with other specified complication, with long-term current use of insulin ([place])   [place], DO       Future Appointments             In 6 days [place], Devonne Doughty, DO [facility]            Signed Prescriptions Disp Refills   topiramate (TOPAMAX) 100 MG tablet 180 tablet 0    Sig: TAKE 1 TABLET BY MOUTH TWICE  DAILY     Neurology: Anticonvulsants - topiramate & zonisamide Failed - 07/10/2021  6:03 PM      Failed - ALT in normal  range and within 360 days    ALT  Date Value Ref Range Status  08/18/2017 16 0 - 44 U/L Final   SGPT (ALT)  Date Value Ref Range Status  09/22/2013 16 U/L Final    Comment:    14-63 NOTE: New Reference Range 08/02/13          Failed - AST in normal range and within 360 days    AST  Date Value Ref Range Status  08/18/2017 22 15 -  41 U/L Final   SGOT(AST)  Date Value Ref Range Status  09/22/2013 17 15 - 37 Unit/L Final         Passed - Cr in normal range and within 360 days    Creat  Date Value Ref Range Status  05/15/2021 0.62 0.50 - 1.03 mg/dL Final         Passed - CO2 in normal range and within 360 days    CO2  Date Value Ref Range Status  05/15/2021 29 20 - 32 mmol/L Final   Co2  Date Value Ref Range Status  09/22/2013 27 21 - 32 mmol/L Final         Passed - Completed PHQ-2 or PHQ-9 in the last 360 days      Passed - Valid encounter within last 12 months    Recent Outpatient Visits           1 month ago Hoarseness of voice   [place], Devonne Doughty, DO   1 month ago Type 2 diabetes mellitus with other specified complication, with long-term current use of insulin ([place])   [facility] [place], Devonne Doughty, DO   2 months ago Psychophysiological insomnia   [place], DO   4 months ago Umbilical hernia with obstruction, without gangrene   [place], DO   5 months ago Type 2 diabetes mellitus with other specified complication, with long-term current use of insulin ([place])   [place], DO       Future Appointments             In 6 days [place], Devonne Doughty, DO [facility]             buPROPion (WELLBUTRIN XL) 150 MG 24 hr tablet 90 tablet 0    Sig: TAKE 1 TABLET BY MOUTH DAILY     Psychiatry: Antidepressants - bupropion Failed - 07/10/2021  6:03 PM      Failed - AST in normal range and within 360 days    AST  Date Value Ref Range Status  08/18/2017 22 15 - 41 U/L Final   SGOT(AST)  Date Value Ref Range Status  09/22/2013 17 15 - 37 Unit/L Final         Failed - ALT in normal range and within 360 days    ALT  Date Value Ref Range Status  08/18/2017 16 0 - 44 U/L Final   SGPT  (ALT)  Date Value Ref Range Status  09/22/2013 16 U/L Final    Comment:    14-63 NOTE: New Reference Range 08/02/13          Passed - Cr in normal range and within 360 days    Creat  Date Value Ref Range Status  05/15/2021 0.62 0.50 - 1.03 mg/dL Final         Passed - Completed PHQ-2  or PHQ-9 in the last 360 days      Passed - Last BP in normal range    BP Readings from Last 1 Encounters:  05/15/21 112/62         Passed - Valid encounter within last 6 months    Recent Outpatient Visits           1 month ago Hoarseness of voice   [facility] [place], Devonne Doughty, DO   1 month ago Type 2 diabetes mellitus with other specified complication, with long-term current use of insulin [facility])   [facility] Olin Hauser, DO   2 months ago Psychophysiological insomnia   [place], DO   4 months ago Umbilical hernia with obstruction, without gangrene   [place], DO   5 months ago Type 2 diabetes mellitus with other specified complication, with long-term current use of insulin ([place])   [place], Devonne Doughty, DO       Future Appointments             In 6 days [place], Devonne Doughty, DO [facility]

## 2021-07-11 NOTE — Chronic Care Management (AMB) (Signed)
  Chronic Care Management Note  07/11/2021 Name: LETZY GULLICKSON MRN: [id number] DOB: Jul 29, 1965  Gwenith Spitz is a 56 y.o. year old female who is a primary care patient of Olin Hauser, DO and is actively engaged with the care management team. I reached out to Gwenith Spitz by phone today to assist with re-scheduling a follow up visit with the Pharmacist  Follow up plan: Unsuccessful telephone outreach attempt made. A HIPAA compliant phone message was left for the patient providing contact information and requesting a return call.  The care management team will reach out to the patient again over the next 7 days.  If patient returns call to provider office, please advise to call [facility]  at [place], [place], [place], [place] [address] Direct Dial: [phone] [email or web address]: [email or web address]

## 2021-07-11 NOTE — Telephone Encounter (Signed)
Requested Prescriptions  Pending Prescriptions Disp Refills  . QUEtiapine (SEROQUEL) 50 MG tablet [Pharmacy Med Name: QUEtiapine Fumarate 50 MG Oral Tablet] 30 tablet 11    Sig: TAKE 1 TABLET BY MOUTH AT  BEDTIME     Not Delegated - Psychiatry:  Antipsychotics - Second Generation (Atypical) - quetiapine Failed - 07/10/2021  6:03 PM      Failed - This refill cannot be delegated      Failed - TSH in normal range and within 360 days    TSH  Date Value Ref Range Status  08/09/2017 1.086 0.350 - 4.500 uIU/mL Final    Comment:    Performed by a 3rd Generation assay with a functional sensitivity of <=0.01 uIU/mL. Performed at [facility], [address] [place], [place] [address]          Failed - Lipid Panel in normal range within the last 12 months    Cholesterol  Date Value Ref Range Status  04/07/2017 136 0 - 200 mg/dL Final  10/07/2012 207 (H) 0 - 200 mg/dL Final   Ldl Cholesterol, Calc  Date Value Ref Range Status  10/07/2012 127 (H) 0 - 100 mg/dL Final   LDL Cholesterol  Date Value Ref Range Status  04/07/2017 67 0 - 99 mg/dL Final    Comment:           Total Cholesterol/HDL:CHD Risk Coronary Heart Disease Risk Table                     Men   Women  1/2 Average Risk   3.4   3.3  Average Risk       5.0   4.4  2 X Average Risk   9.6   7.1  3 X Average Risk  23.4   11.0        Use the calculated Patient Ratio above and the CHD Risk Table to determine the patient's CHD Risk.        ATP III CLASSIFICATION (LDL):  <100     mg/dL   Optimal  100-129  mg/dL   Near or Above                    Optimal  130-159  mg/dL   Borderline  160-189  mg/dL   High  >190     mg/dL   Very High Performed at [facility] [address], [place], [place] [address]    HDL Cholesterol  Date Value Ref Range Status  10/07/2012 25 (L) 40 - 60 mg/dL Final   HDL  Date Value Ref Range Status  04/07/2017 31 (L) >40 mg/dL Final   Triglycerides  Date Value Ref Range Status   04/07/2017 191 (H) <150 mg/dL Final  10/07/2012 273 (H) 0 - 200 mg/dL Final         Failed - CBC within normal limits and completed in the last 12 months    WBC  Date Value Ref Range Status  08/18/2017 9.5 4.0 - 10.5 K/uL Final   RBC  Date Value Ref Range Status  08/18/2017 4.94 3.87 - 5.11 MIL/uL Final   Hemoglobin  Date Value Ref Range Status  08/18/2017 15.3 (H) 12.0 - 15.0 g/dL Final   HGB  Date Value Ref Range Status  09/22/2013 14.6 12.0 - 16.0 g/dL Final   HCT  Date Value Ref Range Status  08/18/2017 45.0 36.0 - 46.0 % Final  09/22/2013 44.3 35.0 -  47.0 % Final   MCHC  Date Value Ref Range Status  08/18/2017 31.9 30.0 - 36.0 g/dL Final   [facility]  Date Value Ref Range Status  08/18/2017 28.5 26.0 - 34.0 pg Final   MCV  Date Value Ref Range Status  08/18/2017 89.5 78.0 - 100.0 fL Final  09/22/2013 90 80 - 100 fL Final   No results found for: "PLTCOUNTKUC", "LABPLAT", "POCPLA" RDW  Date Value Ref Range Status  08/18/2017 13.8 11.5 - 15.5 % Final  09/22/2013 13.8 11.5 - 14.5 % Final         Failed - CMP within normal limits and completed in the last 12 months    Albumin  Date Value Ref Range Status  08/18/2017 3.0 (L) 3.5 - 5.0 g/dL Final  09/22/2013 2.6 (L) 3.4 - 5.0 g/dL Final   Alkaline Phosphatase  Date Value Ref Range Status  08/18/2017 59 38 - 126 U/L Final  09/22/2013 78 Unit/L Final    Comment:    46-116 NOTE: New Reference Range 08/02/13    ALT  Date Value Ref Range Status  08/18/2017 16 0 - 44 U/L Final   SGPT (ALT)  Date Value Ref Range Status  09/22/2013 16 U/L Final    Comment:    14-63 NOTE: New Reference Range 08/02/13    AST  Date Value Ref Range Status  08/18/2017 22 15 - 41 U/L Final   SGOT(AST)  Date Value Ref Range Status  09/22/2013 17 15 - 37 Unit/L Final   BUN  Date Value Ref Range Status  05/15/2021 13 7 - 25 mg/dL Final  09/22/2013 11 7 - 18 mg/dL Final   Calcium  Date Value Ref Range Status   05/15/2021 8.6 8.6 - 10.4 mg/dL Final   Calcium, Total  Date Value Ref Range Status  09/22/2013 8.3 (L) 8.5 - 10.1 mg/dL Final   Calcium, Ion  Date Value Ref Range Status  04/07/2017 1.17 1.15 - 1.40 mmol/L Final   CO2  Date Value Ref Range Status  05/15/2021 29 20 - 32 mmol/L Final   Co2  Date Value Ref Range Status  09/22/2013 27 21 - 32 mmol/L Final   TCO2  Date Value Ref Range Status  04/07/2017 29 22 - 32 mmol/L Final   Creat  Date Value Ref Range Status  05/15/2021 0.62 0.50 - 1.03 mg/dL Final   Glucose  Date Value Ref Range Status  09/22/2013 335 (H) 65 - 99 mg/dL Final   Glucose, Bld  Date Value Ref Range Status  05/15/2021 261 (H) 65 - 139 mg/dL Final    Comment:    .        Non-fasting reference interval .    Glucose-Capillary  Date Value Ref Range Status  08/18/2017 120 (H) 70 - 99 mg/dL Final   Potassium  Date Value Ref Range Status  05/15/2021 4.9 3.5 - 5.3 mmol/L Final  09/22/2013 3.8 3.5 - 5.1 mmol/L Final   Sodium  Date Value Ref Range Status  05/15/2021 139 135 - 146 mmol/L Final  09/22/2013 135 (L) 136 - 145 mmol/L Final   Total Bilirubin  Date Value Ref Range Status  08/18/2017 0.9 0.3 - 1.2 mg/dL Final   Bilirubin,Total  Date Value Ref Range Status  09/22/2013 0.2 0.2 - 1.0 mg/dL Final   Bilirubin, Direct  Date Value Ref Range Status  08/28/2012 < 0.1 0.00 - 0.20 mg/dL Final   Protein, ur  Date Value Ref Range Status  08/18/2017  NEGATIVE NEGATIVE mg/dL Final   Total Protein  Date Value Ref Range Status  08/18/2017 7.0 6.5 - 8.1 g/dL Final  09/22/2013 7.2 6.4 - 8.2 g/dL Final   EGFR (African American)  Date Value Ref Range Status  09/22/2013 >60  Final   GFR calc Af Amer  Date Value Ref Range Status  08/18/2017 >60 >60 mL/min Final    Comment:    (NOTE) The eGFR has been calculated using the CKD EPI equation. This calculation has not been validated in all clinical situations. eGFR's persistently <60 mL/min  signify possible Chronic Kidney Disease.    GFR  Date Value Ref Range Status  09/23/2017 91.12 >60.00 mL/min Final   eGFR  Date Value Ref Range Status  05/15/2021 104 > OR = 60 mL/min/1.47m2 Final    Comment:    The eGFR is based on the CKD-EPI 2021 equation. To calculate  the new eGFR from a previous Creatinine or Cystatin C result, go to https://www.kidney.org/professionals/ kdoqi/gfr%5Fcalculator    EGFR (Non-African Amer.)  Date Value Ref Range Status  09/22/2013 >60  Final    Comment:    eGFR values <48mL/min/1.73 m2 may be an indication of chronic kidney disease (CKD). Calculated eGFR is useful in patients with stable renal function. The eGFR calculation will not be reliable in acutely ill patients when serum creatinine is changing rapidly. It is not useful in  patients on dialysis. The eGFR calculation may not be applicable to patients at the low and high extremes of body sizes, pregnant women, and vegetarians.    GFR calc non Af Amer  Date Value Ref Range Status  08/18/2017 >60 >60 mL/min Final         Passed - Completed PHQ-2 or PHQ-9 in the last 360 days      Passed - Last BP in normal range    BP Readings from Last 1 Encounters:  05/15/21 112/62         Passed - Last Heart Rate in normal range    Pulse Readings from Last 1 Encounters:  05/15/21 75         Passed - Valid encounter within last 6 months    Recent Outpatient Visits          1 month ago Hoarseness of voice   [place], Devonne Doughty, DO   1 month ago Type 2 diabetes mellitus with other specified complication, with long-term current use of insulin [facility])   [facility] Olin Hauser, DO   2 months ago Psychophysiological insomnia   [place], DO   4 months ago Umbilical hernia with obstruction, without gangrene   [place], DO   5 months ago Type 2  diabetes mellitus with other specified complication, with long-term current use of insulin ([place])   [place], Devonne Doughty, DO      Future Appointments            In 6 days [place], Devonne Doughty, DO [facility], [place]           . fluticasone (FLONASE) 50 MCG/ACT nasal spray [Pharmacy Med Name: Fluticasone Propionate 50 MCG/ACT Nasal Suspension] 48 g 0    Sig: USE 2 SPRAYS IN BOTH NOSTRILS  DAILY . USE FOR 4 TO 6 WEEKS,  THEN STOP AND USE SEASONALLY OR  AS NEEDED     Ear, Nose, and Throat: Nasal Preparations -  Corticosteroids Passed - 07/10/2021  6:03 PM      Passed - Valid encounter within last 12 months    Recent Outpatient Visits          1 month ago Hoarseness of voice   [facility] [place], Devonne Doughty, DO   1 month ago Type 2 diabetes mellitus with other specified complication, with long-term current use of insulin ([place])   [facility] [place], Devonne Doughty, DO   2 months ago Psychophysiological insomnia   [place], DO   4 months ago Umbilical hernia with obstruction, without gangrene   [place], DO   5 months ago Type 2 diabetes mellitus with other specified complication, with long-term current use of insulin ([place])   [place], DO      Future Appointments            In 6 days [place], Devonne Doughty, DO [facility], [facility]           . topiramate (TOPAMAX) 100 MG tablet [Pharmacy Med Name: Topiramate 100 MG Oral Tablet] 180 tablet 0    Sig: TAKE 1 TABLET BY MOUTH TWICE  DAILY     Neurology: Anticonvulsants - topiramate & zonisamide Failed - 07/10/2021  6:03 PM      Failed - ALT in normal range and within 360 days    ALT  Date Value Ref Range Status  08/18/2017 16 0 - 44 U/L Final   SGPT (ALT)  Date Value Ref Range Status  09/22/2013 16  U/L Final    Comment:    14-63 NOTE: New Reference Range 08/02/13          Failed - AST in normal range and within 360 days    AST  Date Value Ref Range Status  08/18/2017 22 15 - 41 U/L Final   SGOT(AST)  Date Value Ref Range Status  09/22/2013 17 15 - 37 Unit/L Final         Passed - Cr in normal range and within 360 days    Creat  Date Value Ref Range Status  05/15/2021 0.62 0.50 - 1.03 mg/dL Final         Passed - CO2 in normal range and within 360 days    CO2  Date Value Ref Range Status  05/15/2021 29 20 - 32 mmol/L Final   Co2  Date Value Ref Range Status  09/22/2013 27 21 - 32 mmol/L Final         Passed - Completed PHQ-2 or PHQ-9 in the last 360 days      Passed - Valid encounter within last 12 months    Recent Outpatient Visits          1 month ago Hoarseness of voice   [place], Devonne Doughty, DO   1 month ago Type 2 diabetes mellitus with other specified complication, with long-term current use of insulin ([place])   [facility] Olin Hauser, DO   2 months ago Psychophysiological insomnia   [place], DO   4 months ago Umbilical hernia with obstruction, without gangrene   [place], DO   5 months ago Type 2 diabetes mellitus with other specified complication, with long-term current use of insulin [facility])   [place], Devonne Doughty, DO  Future Appointments            In 6 days [place], Devonne Doughty, DO [facility], [place]           . albuterol (VENTOLIN HFA) 108 (90 Base) MCG/ACT inhaler [Pharmacy Med Name: ALBUTEROL HFA 90MCG/ACT (PV)] 26.8 g 4    Sig: USE 2 INHALATIONS BY MOUTH EVERY 4 HOURS AS NEEDED FOR WHEEZING  OR SHORTNESS OF BREATH     Pulmonology:  Beta Agonists 2 Passed - 07/10/2021  6:03 PM      Passed - Last BP in normal range    BP Readings from  Last 1 Encounters:  05/15/21 112/62         Passed - Last Heart Rate in normal range    Pulse Readings from Last 1 Encounters:  05/15/21 75         Passed - Valid encounter within last 12 months    Recent Outpatient Visits          1 month ago Hoarseness of voice   [place], Devonne Doughty, DO   1 month ago Type 2 diabetes mellitus with other specified complication, with long-term current use of insulin ([place])   [facility] [place], Devonne Doughty, DO   2 months ago Psychophysiological insomnia   [place], DO   4 months ago Umbilical hernia with obstruction, without gangrene   [place], DO   5 months ago Type 2 diabetes mellitus with other specified complication, with long-term current use of insulin ([place])   [place], DO      Future Appointments            In 6 days [place], Devonne Doughty, DO [facility], [facility]           . buPROPion (WELLBUTRIN XL) 150 MG 24 hr tablet [Pharmacy Med Name: buPROPion HCl ER (XL) 150 MG Oral Tablet Extended Release 24 Hour] 90 tablet 0    Sig: TAKE 1 TABLET BY MOUTH DAILY     Psychiatry: Antidepressants - bupropion Failed - 07/10/2021  6:03 PM      Failed - AST in normal range and within 360 days    AST  Date Value Ref Range Status  08/18/2017 22 15 - 41 U/L Final   SGOT(AST)  Date Value Ref Range Status  09/22/2013 17 15 - 37 Unit/L Final         Failed - ALT in normal range and within 360 days    ALT  Date Value Ref Range Status  08/18/2017 16 0 - 44 U/L Final   SGPT (ALT)  Date Value Ref Range Status  09/22/2013 16 U/L Final    Comment:    14-63 NOTE: New Reference Range 08/02/13          Passed - Cr in normal range and within 360 days    Creat  Date Value Ref Range Status  05/15/2021 0.62 0.50 - 1.03 mg/dL Final         Passed -  Completed PHQ-2 or PHQ-9 in the last 360 days      Passed - Last BP in normal range    BP Readings from Last 1 Encounters:  05/15/21 112/62         Passed - Valid encounter within last 6 months    Recent Outpatient Visits  1 month ago Hoarseness of voice   [place], DO   1 month ago Type 2 diabetes mellitus with other specified complication, with long-term current use of insulin [facility])   [place], DO   2 months ago Psychophysiological insomnia   [place], DO   4 months ago Umbilical hernia with obstruction, without gangrene   [place], DO   5 months ago Type 2 diabetes mellitus with other specified complication, with long-term current use of insulin ([place])   [place], Devonne Doughty, DO      Future Appointments            In 6 days [place], Devonne Doughty, DO [facility], [facility]

## 2021-07-17 ENCOUNTER — Telehealth: Payer: Medicare Other

## 2021-07-17 ENCOUNTER — Ambulatory Visit: Payer: Medicare Other | Admitting: Family Medicine

## 2021-07-17 NOTE — Chronic Care Management (AMB) (Signed)
  Chronic Care Management Note  07/17/2021 Name: [name] MRN: [id number] DOB: [birth year]  [name] is a 56 y.o. year old female who is a primary care patient of Olin Hauser, DO and is actively engaged with the care management team. I reached out to [name] by phone today to assist with re-scheduling a follow up visit with the Pharmacist  Follow up plan: Telephone appointment with care management team member scheduled for:07/26/2021  Noreene Larsson, [place], [place], [place] [address] Direct Dial: [phone] [name]

## 2021-07-19 ENCOUNTER — Other Ambulatory Visit: Payer: Self-pay | Admitting: Family Medicine

## 2021-07-19 DIAGNOSIS — F331 Major depressive disorder, recurrent, moderate: Secondary | ICD-10-CM

## 2021-07-19 MED ORDER — CITALOPRAM HYDROBROMIDE 40 MG PO TABS: 40.0000 mg | ORAL_TABLET | Freq: Every day | ORAL | 1 refills | Status: DC

## 2021-07-22 ENCOUNTER — Other Ambulatory Visit: Payer: Self-pay | Admitting: Family Medicine

## 2021-07-22 DIAGNOSIS — F331 Major depressive disorder, recurrent, moderate: Secondary | ICD-10-CM

## 2021-07-23 NOTE — Telephone Encounter (Signed)
Refill sent to mail order as requested by pt. Requested Prescriptions  Pending Prescriptions Disp Refills  . citalopram (CELEXA) 40 MG tablet [Pharmacy Med Name: CITALOPRAM '40MG'$  TABLETS] 90 tablet 1    Sig: TAKE 1 TABLET(40 MG) BY MOUTH DAILY     Psychiatry:  Antidepressants - SSRI Passed - 07/22/2021 11:55 AM      Passed - Completed PHQ-2 or PHQ-9 in the last 360 days      Passed - Valid encounter within last 6 months    Recent Outpatient Visits          1 month ago Hoarseness of voice   [facility] [place], Devonne Doughty, DO   2 months ago Type 2 diabetes mellitus with other specified complication, with long-term current use of insulin [facility])   [facility] Olin Hauser, DO   3 months ago Psychophysiological insomnia   [place], DO   4 months ago Umbilical hernia with obstruction, without gangrene   [place], DO   6 months ago Type 2 diabetes mellitus with other specified complication, with long-term current use of insulin [facility])   [place], Devonne Doughty, DO

## 2021-07-23 NOTE — Telephone Encounter (Signed)
Pt is happy with the rx going to mail order as it did, I will refuse this one.

## 2021-07-24 ENCOUNTER — Ambulatory Visit (INDEPENDENT_AMBULATORY_CARE_PROVIDER_SITE_OTHER): Payer: Medicare Other | Admitting: Vascular Surgery

## 2021-07-24 ENCOUNTER — Encounter: Payer: Self-pay | Admitting: Vascular Surgery

## 2021-07-24 ENCOUNTER — Ambulatory Visit (HOSPITAL_COMMUNITY)
Admission: RE | Admit: 2021-07-24 | Discharge: 2021-07-24 | Disposition: A | Discharge status: Home / Self Care | Payer: Medicare Other | Source: Ambulatory Visit | Attending: Vascular Surgery | Admitting: Vascular Surgery

## 2021-07-24 VITALS — BP 92/59 | HR 86 | Temp 98.2°F | Resp 20 | Ht 66.0 in | Wt 233.0 lb

## 2021-07-24 DIAGNOSIS — I739 Peripheral vascular disease, unspecified: Secondary | ICD-10-CM

## 2021-07-24 NOTE — Progress Notes (Signed)
Patient ID: [name], female   DOB: Mar 17, 1965, 56 y.o.   MRN: [id number]  Reason for Consult: New Patient (Initial Visit)   Referred by Nobie Putnam *  Subjective:     HPI:  [name] is a 56 y.o. female with history of uncontrolled diabetes recently A1c was 13 now down to around 9.  She also has hyperlipidemia and hypertension recently started on a statin and she also takes Plavix.  She is a current everyday smoker and has a diagnosis of COPD.  She has a history of a stroke which limits her ability to walk with weakness in her right lower extremity mostly she uses a motorized wheelchair.  She did have a wound on her right great toe this healed under the care of Dr. Amalia Hailey with podiatry.  She does have pain in her bilateral legs mostly in the thighs at night also in the knees where she has previous knee replacements.  Currently does not have any tissue loss or ulceration.  Past Medical History:  Diagnosis Date   Anxiety    Cancer ([place])    a spot on liver and treated    Complication of anesthesia    restless,easily upset   COPD (chronic obstructive pulmonary disease) (HCC)    Depression    Diabetes mellitus without complication (HCC)    Diverticulitis    Fatty liver    GERD (gastroesophageal reflux disease)    Headache(784.0)    migraines   History of kidney stones    Hyperlipidemia    Hypertension    Pneumonia    Restless    Stroke ([place])    Family History  Problem Relation Age of Onset   Diabetes Mother    Hypertension Mother    Hypertension Father    Past Surgical History:  Procedure Laterality Date   ABDOMINAL HYSTERECTOMY     ACHILLES TENDON LENGTHENING Right 08/18/2017   Procedure: ACHILLES TENDON LENGTHENING;  Surgeon: Altamese Newell, MD;  Location: [facility];  Service: Orthopedics;  Laterality: Right;   ANTERIOR CERVICAL DECOMP/DISCECTOMY FUSION N/A 03/11/2012   Procedure: ANTERIOR CERVICAL DECOMPRESSION/DISCECTOMY FUSION 1 LEVEL;  Surgeon: Ophelia Charter, MD;  Location: [facility];  Service: Neurosurgery;  Laterality: N/A;  Cervical five-six anterior cervical decompression with fusion interbody prothesis plating and bonegraft   BACK SURGERY     EXTERNAL FIXATION LEG Right 08/10/2017   Procedure: EXTERNAL FIXATION ANKLE;  Surgeon: Dereck Leep, MD;  Location: [facility] ORS;  Service: Orthopedics;  Laterality: Right;   EXTERNAL FIXATION REMOVAL Right 08/18/2017   Procedure: REMOVAL EXTERNAL FIXATION LEG;  Surgeon: Altamese Iroquois, MD;  Location: [facility];  Service: Orthopedics;  Laterality: Right;   EYE SURGERY     HERNIA REPAIR     JOINT REPLACEMENT     bil knees   ORIF ANKLE FRACTURE Right 08/18/2017   Procedure: OPEN REDUCTION INTERNAL FIXATION (ORIF) ANKLE FRACTURE;  Surgeon: Altamese McCrory, MD;  Location: [facility];  Service: Orthopedics;  Laterality: Right;   REPLACEMENT TOTAL KNEE BILATERAL      Short Social History:  Social History   Tobacco Use   Smoking status: Every Day    Packs/day: 1.00    Years: 20.00    Total pack years: 20.00    Types: Cigarettes   Smokeless tobacco: Never  Substance Use Topics   Alcohol use: No    Allergies  Allergen Reactions   Tramadol Nausea And Vomiting and Hypertension   Augmentin [Amoxicillin-Pot Clavulanate] Rash  Current Outpatient Medications  Medication Sig Dispense Refill   albuterol (VENTOLIN HFA) 108 (90 Base) MCG/ACT inhaler USE 2 INHALATIONS BY MOUTH EVERY 4 HOURS AS NEEDED FOR WHEEZING  OR SHORTNESS OF BREATH 26.8 g 4   atorvastatin (LIPITOR) 40 MG tablet Take 1 tablet (40 mg total) by mouth at bedtime. 90 tablet 3   Buprenorphine HCl-Naloxone HCl 8-2 MG FILM Place under the tongue 3 (three) times daily.     buPROPion (WELLBUTRIN XL) 150 MG 24 hr tablet TAKE 1 TABLET BY MOUTH DAILY 90 tablet 0   citalopram (CELEXA) 40 MG tablet Take 1 tablet (40 mg total) by mouth daily. 90 tablet 1   clopidogrel (PLAVIX) 75 MG tablet Take 1 tablet (75 mg total) by mouth daily. 90 tablet 3    dicyclomine (BENTYL) 20 MG tablet Take 1 tablet (20 mg total) by mouth 4 (four) times daily -  before meals and at bedtime. As needed for abdominal pain cramping 60 tablet 2   fluticasone (FLONASE) 50 MCG/ACT nasal spray USE 2 SPRAYS IN BOTH NOSTRILS  DAILY . USE FOR 4 TO 6 WEEKS,  THEN STOP AND USE SEASONALLY OR  AS NEEDED 48 g 0   gabapentin (NEURONTIN) 600 MG tablet Take 1 tablet (600 mg total) by mouth 4 (four) times daily. 360 tablet 0   metFORMIN (GLUCOPHAGE) 1000 MG tablet Take 1 tablet (1,000 mg total) by mouth 2 (two) times daily with a meal. 180 tablet 1   MOUNJARO 5 MG/0.5ML Pen Inject 5 mg into the skin once a week. 2 mL 2   mupirocin ointment (BACTROBAN) 2 % APPLY TOPICALLY TO THE AFFECTED AREA TWICE DAILY FOR UP TO 7 TO 10 DAYS AS NEEDED FOR SKIN INFECTION 22 g 2   NYSTATIN powder APPLY TOPICALLY A SMALL AMOUNT TWICE DAILY AS NEEDED 60 g 2   ondansetron (ZOFRAN-ODT) 4 MG disintegrating tablet Take 1 tablet (4 mg total) by mouth every 8 (eight) hours as needed for nausea or vomiting. 30 tablet 2   pantoprazole (PROTONIX) 40 MG tablet TAKE 1 TABLET BY MOUTH EVERY DAY 90 tablet 0   pioglitazone (ACTOS) 45 MG tablet Take 45 mg by mouth daily.     QUEtiapine (SEROQUEL) 100 MG tablet Take 1 tablet (100 mg total) by mouth at bedtime. 90 tablet 1   topiramate (TOPAMAX) 100 MG tablet TAKE 1 TABLET BY MOUTH TWICE  DAILY 180 tablet 0   TRELEGY ELLIPTA 100-62.5-25 MCG/ACT AEPB INHALE 1 PUFF INTO THE LUNGS DAILY 60 each 11   triamcinolone cream (KENALOG) 0.1 % Apply 1 application. topically 2 (two) times daily. 30 g 2   Vitamin D, Ergocalciferol, (DRISDOL) 50000 units CAPS capsule Take 50,000 Units by mouth every 7 (seven) days. On Wednesday     No current facility-administered medications for this visit.    Review of Systems  Constitutional:  Constitutional negative. HENT: HENT negative.  Eyes: Eyes negative.  Respiratory: Respiratory negative.  Cardiovascular: Positive for claudication.   GI: Gastrointestinal negative.  Musculoskeletal: Positive for back pain, leg pain and joint pain.  Neurological: Neurological negative. Hematologic: Hematologic/lymphatic negative.  Psychiatric: Psychiatric negative.        Objective:  Objective   Vitals:   07/24/21 1541  BP: (!) 92/59  Pulse: 86  Resp: 20  Temp: 98.2 F (36.8 C)  SpO2: 95%  Weight: 233 lb (105.7 kg)  Height: '5\' 6"'$  (1.676 m)   Body mass index is 37.61 kg/m.  Physical Exam HENT:  Head: Normocephalic.     Nose: Nose normal.     Mouth/Throat:     Mouth: Mucous membranes are moist.  Eyes:     Pupils: Pupils are equal, round, and reactive to light.  Cardiovascular:     Pulses:          Popliteal pulses are 0 on the right side and 0 on the left side.       Dorsalis pedis pulses are 0 on the right side and 0 on the left side.       Posterior tibial pulses are 0 on the right side and 0 on the left side.     Comments: I cannot reliably feel femoral pulses likely due to body habitus Pulmonary:     Effort: Pulmonary effort is normal.  Abdominal:     General: Abdomen is flat.     Palpations: Abdomen is soft.  Musculoskeletal:     Right lower leg: No edema.     Left lower leg: No edema.  Skin:    General: Skin is warm and dry.     Capillary Refill: Capillary refill takes less than 2 seconds.  Neurological:     General: No focal deficit present.     Mental Status: She is alert.  Psychiatric:        Mood and Affect: Mood normal.        Behavior: Behavior normal.        Thought Content: Thought content normal.        Judgment: Judgment normal.     Data: ABI Findings:  +---------+------------------+-----+----------+--------+  Right    Rt Pressure (mmHg)IndexWaveform  Comment   +---------+------------------+-----+----------+--------+  Brachial 110                                        +---------+------------------+-----+----------+--------+  PTA      46                0.41  monophasic          +---------+------------------+-----+----------+--------+  DP       58                0.52 monophasic          +---------+------------------+-----+----------+--------+  Great Toe48                0.43 Abnormal            +---------+------------------+-----+----------+--------+   +---------+------------------+-----+----------+-------+  Left     Lt Pressure (mmHg)IndexWaveform  Comment  +---------+------------------+-----+----------+-------+  Brachial 111                                       +---------+------------------+-----+----------+-------+  PTA      77                0.69 monophasic         +---------+------------------+-----+----------+-------+  DP       47                0.42 monophasic         +---------+------------------+-----+----------+-------+  Great Toe74                0.67 Abnormal           +---------+------------------+-----+----------+-------+   +-------+-----------+-----------+------------+------------+  ABI/TBIToday's ABIToday's TBIPrevious ABIPrevious TBI  +-------+-----------+-----------+------------+------------+  Right  0.52       0.43                                 +-------+-----------+-----------+------------+------------+  Left   0.69       0.67                                 +-------+-----------+-----------+------------+------------+           Summary:  Right: Resting right ankle-brachial index indicates moderate right lower  extremity arterial disease. The right toe-brachial index is abnormal.   Left: Resting left ankle-brachial index indicates moderate left lower  extremity arterial disease. The left toe-brachial index is abnormal.      Assessment/Plan:    56 year old female with moderately depressed ABIs bilaterally at this time really appears to be asymptomatic.  I discussed with her the need for smoking cessation as well as protecting her feet from causing any  wounds.  She likely would need a CT angio prior to any further evaluation given that I believe she is inflow disease although her exam is limited by body habitus.  She may also have SFA disease bilaterally.  Either way I would not want any endovascular treatment while the patient continues to smoke with uncontrolled diabetes and I discussed this plan with her.  I will see her back in 6 months with repeat ABIs unless she has issues prior that requiring additional evaluation.     Waynetta Sandy MD [facility]

## 2021-07-26 ENCOUNTER — Ambulatory Visit (INDEPENDENT_AMBULATORY_CARE_PROVIDER_SITE_OTHER): Payer: Medicare Other | Admitting: Pharmacist

## 2021-07-26 ENCOUNTER — Other Ambulatory Visit: Payer: Self-pay

## 2021-07-26 DIAGNOSIS — J432 Centrilobular emphysema: Secondary | ICD-10-CM

## 2021-07-26 DIAGNOSIS — E1169 Type 2 diabetes mellitus with other specified complication: Secondary | ICD-10-CM

## 2021-07-26 NOTE — Patient Instructions (Signed)
Visit Information  Thank you for taking time to visit with me today. Please don't hesitate to contact me if I can be of assistance to you before our next scheduled telephone appointment.  Following are the goals we discussed today:   Goals Addressed             This Visit's Progress    Pharmacy Goals       Our goal A1c is less than 7%. This corresponds with fasting sugars less than 130 and 2 hour after meal sugars less than 180. Please check your blood sugar, keep a log of the results and have this for [place] to review during our next telephone appointment  Our goal bad cholesterol, or LDL, is less than 70 . This is why it is important to continue taking your atorvastatin  Feel free to call me with any questions or concerns. I look forward to our next call!   Wallace Cullens, PharmD, [place], CPP Clinical Pharmacist [facility] [phone]         Our next appointment is by telephone on 08/28/2021 at 1:45 pm  Please call the care guide team at [phone] if you need to cancel or reschedule your appointment.    Patient verbalizes understanding of instructions and care plan provided today and agrees to view in [place]. Active MyChart status and patient understanding of how to access instructions and care plan via MyChart confirmed with patient.

## 2021-07-26 NOTE — Chronic Care Management (AMB) (Signed)
Chronic Care Management CCM Pharmacy Note  07/26/2021 Name:  [name] MRN:  [id number] DOB:  May 07, 1965   Subjective: [name] is an 56 y.o. year old female who is a primary patient of Olin Hauser, DO.  The CCM team was consulted for assistance with disease management and care coordination needs.    Engaged with patient by telephone for follow up visit for pharmacy case management and/or care coordination services.   Objective:  Medications Reviewed Today     Reviewed by Rennis Petty, RPH-CPP (Pharmacist) on 07/26/21 at 1248  Med List Status: <None>   Medication Order Taking? Sig Documenting Provider Last Dose Status Informant  albuterol (VENTOLIN HFA) 108 (90 Base) MCG/ACT inhaler [id number]  USE 2 INHALATIONS BY MOUTH EVERY 4 HOURS AS NEEDED FOR WHEEZING  OR SHORTNESS OF BREATH Karamalegos, Devonne Doughty, DO  Active   atorvastatin (LIPITOR) 40 MG tablet [id number] Yes Take 1 tablet (40 mg total) by mouth at bedtime. Olin Hauser, DO Taking Active   Buprenorphine HCl-Naloxone HCl 8-2 MG FILM [id number]  Place under the tongue 3 (three) times daily. [provider]  Active   buPROPion (WELLBUTRIN XL) 150 MG 24 hr tablet [id number]  TAKE 1 TABLET BY MOUTH DAILY Karamalegos, Devonne Doughty, DO  Active   citalopram (CELEXA) 40 MG tablet [id number]  Take 1 tablet (40 mg total) by mouth daily. Olin Hauser, DO  Active   clopidogrel (PLAVIX) 75 MG tablet [id number]  Take 1 tablet (75 mg total) by mouth daily. Karamalegos, Devonne Doughty, DO  Active   dicyclomine (BENTYL) 20 MG tablet [id number]  Take 1 tablet (20 mg total) by mouth 4 (four) times daily -  before meals and at bedtime. As needed for abdominal pain cramping Olin Hauser, DO  Active   fluticasone (FLONASE) 50 MCG/ACT nasal spray [id number]  USE 2 SPRAYS IN BOTH NOSTRILS  DAILY . USE FOR 4 TO 6 WEEKS,  THEN STOP AND USE SEASONALLY OR  AS NEEDED Karamalegos, Devonne Doughty, DO   Active   gabapentin (NEURONTIN) 600 MG tablet [id number]  Take 1 tablet (600 mg total) by mouth 4 (four) times daily. Olin Hauser, DO  Active   metFORMIN (GLUCOPHAGE) 1000 MG tablet [id number] Yes Take 1 tablet (1,000 mg total) by mouth 2 (two) times daily with a meal. Olin Hauser, DO Taking Active   MOUNJARO 5 MG/0.5ML Pen [id number] Yes Inject 5 mg into the skin once a week. Olin Hauser, DO Taking Active   mupirocin ointment (BACTROBAN) 2 % [id number]  APPLY TOPICALLY TO THE AFFECTED AREA TWICE DAILY FOR UP TO 7 TO 10 DAYS AS NEEDED FOR SKIN INFECTION Olin Hauser, DO  Active   NYSTATIN powder [id number]  APPLY TOPICALLY A SMALL AMOUNT TWICE DAILY AS NEEDED [place], Devonne Doughty, DO  Active   ondansetron (ZOFRAN-ODT) 4 MG disintegrating tablet [id number]  Take 1 tablet (4 mg total) by mouth every 8 (eight) hours as needed for nausea or vomiting. Olin Hauser, DO  Active   pantoprazole (PROTONIX) 40 MG tablet [id number]  TAKE 1 TABLET BY MOUTH EVERY DAY Karamalegos, Alexander Lenna Sciara, DO  Active   pioglitazone (ACTOS) 45 MG tablet [id number] Yes Take 45 mg by mouth daily. [provider] Taking Active Pharmacy Records  QUEtiapine (SEROQUEL) 100 MG tablet [id number]  Take 1 tablet (100 mg total) by mouth at bedtime. Olin Hauser, DO  Active   RELISTOR 150 MG TABS [id number] Yes Take  3 tablets by mouth every morning. [provider] Taking Active   topiramate (TOPAMAX) 100 MG tablet [id number]  TAKE 1 TABLET BY MOUTH TWICE  DAILY Olin Hauser, DO  Active   TRELEGY ELLIPTA 100-62.5-25 MCG/ACT AEPB [id number] Yes INHALE 1 PUFF INTO THE LUNGS DAILY Olin Hauser, DO Taking Active   triamcinolone cream (KENALOG) 0.1 % [id number]  Apply 1 application. topically 2 (two) times daily. Olin Hauser, DO  Active   Vitamin D, Ergocalciferol, (DRISDOL) 50000 units CAPS capsule [id number]  Take 50,000 Units  by mouth every 7 (seven) days. On Wednesday [provider]  Active             Pertinent Labs:  Lab Results  Component Value Date   HGBA1C 9.7 (H) 05/15/2021   Lab Results  Component Value Date   CHOL 136 04/07/2017   HDL 31 (L) 04/07/2017   LDLCALC 67 04/07/2017   TRIG 191 (H) 04/07/2017   CHOLHDL 4.4 04/07/2017   Lab Results  Component Value Date   CREATININE 0.62 05/15/2021   BUN 13 05/15/2021   NA 139 05/15/2021   K 4.9 05/15/2021   CL 102 05/15/2021   CO2 29 05/15/2021    SDOH:  (Social Determinants of Health) assessments and interventions performed:    [place]  Review of patient past medical history, allergies, medications, health status, including review of consultants reports, laboratory and other test data, was performed as part of comprehensive evaluation and provision of chronic care management services.   Care Plan : PharmD - Medication Adherence/T2DM Management  Updates made by Rennis Petty, RPH-CPP since 07/26/2021 12:00 AM     Problem: Disease Progression      Long-Range Goal: Disease Progression Prevented or Minimized   Start Date: 08/22/2020  Expected End Date: 11/20/2020  Recent Progress: On track  Priority: High  Note:   Current Barriers:  Unable to achieve control of A1C  Lack of blood sugar results for clinical team  Pharmacist Clinical Goal(s):  Over the next 90 days, patient will achieve adherence to monitoring guidelines and medication adherence to achieve therapeutic efficacy through collaboration with PharmD and provider.    Interventions: 1:1 collaboration with Olin Hauser, DO regarding development and update of comprehensive plan of care as evidenced by provider attestation and co-signature Inter-disciplinary care team collaboration (see longitudinal plan of care) Perform chart review. Patient seen for Office Visit with [facility] on 07/24/2021 Today patient states  that she has a question for Dr. Donzetta Matters regarding results of a procedure that she had at latest office visit. Confirms has phone number for his office and will call to follow up Reports constipation improved; currently having bowel movement ~once daily.  Working with pain management specialist on pain control and constipation. Reports provider stopped Symproic and restarted her on Relistor  Type 2 Diabetes: Uncontrolled; current treatment: Metformin 1000 mg twice daily Pioglitazone 45 mg daily Mounjaro 5 mg weekly on Wednesdays Reports tolerating well Previous therapies tried: Genevieve Norlander Reports recent fasting blood sugar readings ranging 130-170 Encourage to have regular well-balanced meals throughout the day, while controlling carbohydrate portion sizes Current exercise: limited by right side deficit due to stroke and previous injury to right ankle from a fall Statin: atorvastatin 40 mg daily Encourage patient to monitor blood sugars, keep a log of results and have this record to review at medical appointments  Tobacco Use: Reports smoking ~ 1 pack/day Motivation: quality of life, playing  with grandkids Not ready to set quit date today; reports smoking increased recently as pain has not been well controlled Next follow up with Pain Management Specialist on 7/26 Encourage patient to contact [place] Quitline for support  Medication Adherence: Reports medication adherence improved since started using weekly pillbox   Patient Goals/Self-Care Activities Over the next 90 days, patient will:  - focus on medication adherence by using weekly pillbox - check glucose, document, and provide at future appointments - attend medical appointment as scheduled  Follow Up Plan: Telephone follow up appointment with care management team member scheduled for: 08/28/2021 at 1:45 pm      Wallace Cullens, PharmD, BCACP, [place] [phone]

## 2021-08-05 DIAGNOSIS — M961 Postlaminectomy syndrome, not elsewhere classified: Secondary | ICD-10-CM | POA: Diagnosis not present

## 2021-08-05 DIAGNOSIS — I69351 Hemiplegia and hemiparesis following cerebral infarction affecting right dominant side: Secondary | ICD-10-CM | POA: Diagnosis not present

## 2021-08-05 DIAGNOSIS — M545 Low back pain, unspecified: Secondary | ICD-10-CM | POA: Diagnosis not present

## 2021-08-05 DIAGNOSIS — Z029 Encounter for administrative examinations, unspecified: Secondary | ICD-10-CM | POA: Diagnosis not present

## 2021-08-05 DIAGNOSIS — F17210 Nicotine dependence, cigarettes, uncomplicated: Secondary | ICD-10-CM | POA: Diagnosis not present

## 2021-08-05 DIAGNOSIS — Z79891 Long term (current) use of opiate analgesic: Secondary | ICD-10-CM | POA: Diagnosis not present

## 2021-08-06 ENCOUNTER — Telehealth: Payer: Self-pay

## 2021-08-06 NOTE — Telephone Encounter (Signed)
Pt called stating she had an important question for Dr Donzetta Matters.  Reviewed pt's chart, returned pt's call, no answer, lf vm stating if she had an urgent matter to call after hours line. If it could wait until tomorrow, she will be called again or she could call back.

## 2021-08-09 ENCOUNTER — Other Ambulatory Visit: Payer: Self-pay | Admitting: Family Medicine

## 2021-08-09 DIAGNOSIS — G894 Chronic pain syndrome: Secondary | ICD-10-CM

## 2021-08-09 NOTE — Telephone Encounter (Signed)
Requested Prescriptions  Pending Prescriptions Disp Refills  . gabapentin (NEURONTIN) 600 MG tablet [Pharmacy Med Name: GABAPENTIN '600MG'$  TABLETS] 360 tablet 0    Sig: TAKE 1 TABLET(600 MG) BY MOUTH FOUR TIMES DAILY     Neurology: Anticonvulsants - gabapentin Passed - 08/09/2021 10:55 AM      Passed - Cr in normal range and within 360 days    Creat  Date Value Ref Range Status  05/15/2021 0.62 0.50 - 1.03 mg/dL Final         Passed - Completed PHQ-2 or PHQ-9 in the last 360 days      Passed - Valid encounter within last 12 months    Recent Outpatient Visits          2 months ago Hoarseness of voice   [facility] [place], Devonne Doughty, DO   2 months ago Type 2 diabetes mellitus with other specified complication, with long-term current use of insulin [facility])   [place], DO   3 months ago Psychophysiological insomnia   [place], DO   5 months ago Umbilical hernia with obstruction, without gangrene   [place], DO   6 months ago Type 2 diabetes mellitus with other specified complication, with long-term current use of insulin ([place])   [place], Devonne Doughty, DO      Future Appointments            In 4 days [place], Devonne Doughty, DO [facility], [facility]

## 2021-08-12 DIAGNOSIS — J432 Centrilobular emphysema: Secondary | ICD-10-CM

## 2021-08-12 DIAGNOSIS — E1169 Type 2 diabetes mellitus with other specified complication: Secondary | ICD-10-CM

## 2021-08-13 ENCOUNTER — Ambulatory Visit: Payer: Medicare Other | Admitting: Family Medicine

## 2021-08-23 ENCOUNTER — Other Ambulatory Visit: Payer: Self-pay | Admitting: Family Medicine

## 2021-08-23 DIAGNOSIS — E1169 Type 2 diabetes mellitus with other specified complication: Secondary | ICD-10-CM

## 2021-08-23 NOTE — Telephone Encounter (Signed)
Requested medication (s) are due for refill today: yes  Requested medication (s) are on the active medication list: yes  Last refill:  06/03/21  Future visit scheduled:yes  Notes to clinic:  Unable to refill per protocol, routing for review.     Requested Prescriptions  Pending Prescriptions Disp Refills   MOUNJARO 5 MG/0.5ML Pen [Pharmacy Med Name: MOUNJARO '5MG'$ /0.5ML PF PEN INJ] 2 mL 2    Sig: ADMINISTER 5 MG UNDER THE SKIN 1 TIME A WEEK     Off-Protocol Failed - 08/23/2021 10:17 AM      Failed - Medication not assigned to a protocol, review manually.      Passed - Valid encounter within last 12 months    Recent Outpatient Visits           2 months ago Hoarseness of voice   [facility] [place], Devonne Doughty, DO   3 months ago Type 2 diabetes mellitus with other specified complication, with long-term current use of insulin [facility])   [facility] Olin Hauser, DO   4 months ago Psychophysiological insomnia   [place], DO   5 months ago Umbilical hernia with obstruction, without gangrene   [place], DO   7 months ago Type 2 diabetes mellitus with other specified complication, with long-term current use of insulin [facility])   [place], Devonne Doughty, [place]

## 2021-08-28 ENCOUNTER — Telehealth: Payer: Self-pay | Admitting: Pharmacist

## 2021-08-28 ENCOUNTER — Telehealth: Payer: Medicare Other

## 2021-08-28 NOTE — Telephone Encounter (Signed)
  Chronic Care Management   Outreach Note  08/28/2021 Name: [name] MRN: [id number] DOB: [birth year]  Referred by: Olin Hauser, DO Reason for referral : No chief complaint on file.   Was unable to reach patient via telephone today and have left HIPAA compliant voicemail asking patient to return my call.    Follow Up Plan: Will collaborate with Care Guide to outreach to schedule follow up with me  Wallace Cullens, PharmD, [place] Management [phone]

## 2021-09-05 DIAGNOSIS — M545 Low back pain, unspecified: Secondary | ICD-10-CM | POA: Diagnosis not present

## 2021-09-05 DIAGNOSIS — I69351 Hemiplegia and hemiparesis following cerebral infarction affecting right dominant side: Secondary | ICD-10-CM | POA: Diagnosis not present

## 2021-09-05 DIAGNOSIS — Z029 Encounter for administrative examinations, unspecified: Secondary | ICD-10-CM | POA: Diagnosis not present

## 2021-09-05 DIAGNOSIS — M961 Postlaminectomy syndrome, not elsewhere classified: Secondary | ICD-10-CM | POA: Diagnosis not present

## 2021-09-05 DIAGNOSIS — F17210 Nicotine dependence, cigarettes, uncomplicated: Secondary | ICD-10-CM | POA: Diagnosis not present

## 2021-09-05 DIAGNOSIS — Z79891 Long term (current) use of opiate analgesic: Secondary | ICD-10-CM | POA: Diagnosis not present

## 2021-09-11 ENCOUNTER — Ambulatory Visit: Payer: Medicare Other | Admitting: Pharmacist

## 2021-09-11 DIAGNOSIS — E1169 Type 2 diabetes mellitus with other specified complication: Secondary | ICD-10-CM

## 2021-09-11 NOTE — Chronic Care Management (AMB) (Signed)
  Chronic Care Management   Outreach Note  09/11/2021 Name: AMADA HALLISEY MRN: [id number] DOB: 02/28/1965  Referred by: Olin Hauser, DO Reason for referral : No chief complaint on file.   Connect with patient by telephone and verify identity, but then connection is lost.   Unable to reach patient by telephone again today.  Follow Up Plan: Will attempt to reach patient by telephone again within the next 14 days  Wallace Cullens, PharmD, [place] Management [phone]

## 2021-09-12 ENCOUNTER — Ambulatory Visit: Payer: Self-pay | Admitting: Licensed Clinical Social Worker

## 2021-09-12 DIAGNOSIS — I69351 Hemiplegia and hemiparesis following cerebral infarction affecting right dominant side: Secondary | ICD-10-CM | POA: Diagnosis not present

## 2021-09-12 DIAGNOSIS — F17210 Nicotine dependence, cigarettes, uncomplicated: Secondary | ICD-10-CM | POA: Diagnosis not present

## 2021-09-12 DIAGNOSIS — Z79891 Long term (current) use of opiate analgesic: Secondary | ICD-10-CM | POA: Diagnosis not present

## 2021-09-12 DIAGNOSIS — M545 Low back pain, unspecified: Secondary | ICD-10-CM | POA: Diagnosis not present

## 2021-09-12 DIAGNOSIS — M961 Postlaminectomy syndrome, not elsewhere classified: Secondary | ICD-10-CM | POA: Diagnosis not present

## 2021-09-12 NOTE — Chronic Care Management (AMB) (Signed)
    Clinical Social Work  Care Management   Phone Outreach    09/12/2021 Name: [name] MRN: [id number] DOB: 1965-01-19  [name] is a 56 y.o. year old female who is a primary care patient of Olin Hauser, DO .   Patient was not contacted during this encounter. Per chart review, pt is not currently receiving CCM Social Work services. Pt is receiving f/up through [place].  Plan: No CCM LCSW f/up required  Review of patient status, including review of consultants reports, relevant laboratory and other test results, and collaboration with appropriate care team members and the patient's provider was performed as part of comprehensive patient evaluation and provision of care management services.     Christa See, MSW, [place] [facility] Care Management [email or web address] Phone [phone] 1:54 PM

## 2021-09-19 ENCOUNTER — Other Ambulatory Visit: Payer: Self-pay | Admitting: Family Medicine

## 2021-09-19 DIAGNOSIS — Z794 Long term (current) use of insulin: Secondary | ICD-10-CM

## 2021-09-19 DIAGNOSIS — E1169 Type 2 diabetes mellitus with other specified complication: Secondary | ICD-10-CM

## 2021-09-23 NOTE — Telephone Encounter (Signed)
(labs are from out of system but present) Requested Prescriptions  Pending Prescriptions Disp Refills  . metFORMIN (GLUCOPHAGE) 1000 MG tablet [Pharmacy Med Name: METFORMIN $RemoveBeforeD'1000MG'[id number]$  TABLETS] 180 tablet 0    Sig: TAKE 1 TABLET(1000 MG) BY MOUTH TWICE DAILY WITH A MEAL     Endocrinology:  Diabetes - Biguanides Failed - 09/19/2021  4:56 PM      Failed - HBA1C is between 0 and 7.9 and within 180 days    Hgb A1c MFr Bld  Date Value Ref Range Status  05/15/2021 9.7 (H) <5.7 % of total Hgb Final    Comment:    For someone without known diabetes, a hemoglobin A1c value of 6.5% or greater indicates that they may have  diabetes and this should be confirmed with a follow-up  test. . For someone with known diabetes, a value <7% indicates  that their diabetes is well controlled and a value  greater than or equal to 7% indicates suboptimal  control. A1c targets should be individualized based on  duration of diabetes, age, comorbid conditions, and  other considerations. . Currently, no consensus exists regarding use of hemoglobin A1c for diagnosis of diabetes for children. .          Failed - B12 Level in normal range and within 720 days    Vitamin B-12  Date Value Ref Range Status  04/07/2017 252 180 - 914 pg/mL Final    Comment:    (NOTE) This assay is not validated for testing neonatal or myeloproliferative syndrome specimens for Vitamin B12 levels. Performed at [facility], [place] [address] [place], [place] [address]          Failed - CBC within normal limits and completed in the last 12 months    WBC  Date Value Ref Range Status  08/18/2017 9.5 4.0 - 10.5 K/uL Final   RBC  Date Value Ref Range Status  08/18/2017 4.94 3.87 - 5.11 MIL/uL Final   Hemoglobin  Date Value Ref Range Status  08/18/2017 15.3 (H) 12.0 - 15.0 g/dL Final   HGB  Date Value Ref Range Status  09/22/2013 14.6 12.0 - 16.0 g/dL Final   HCT  Date Value Ref Range Status  08/18/2017 45.0 36.0 - 46.0  % Final  09/22/2013 44.3 35.0 - 47.0 % Final   MCHC  Date Value Ref Range Status  08/18/2017 31.9 30.0 - 36.0 g/dL Final   [facility]  Date Value Ref Range Status  08/18/2017 28.5 26.0 - 34.0 pg Final   MCV  Date Value Ref Range Status  08/18/2017 89.5 78.0 - 100.0 fL Final  09/22/2013 90 80 - 100 fL Final   No results found for: "PLTCOUNTKUC", "LABPLAT", "POCPLA" RDW  Date Value Ref Range Status  08/18/2017 13.8 11.5 - 15.5 % Final  09/22/2013 13.8 11.5 - 14.5 % Final         Passed - Cr in normal range and within 360 days    Creat  Date Value Ref Range Status  05/15/2021 0.62 0.50 - 1.03 mg/dL Final         Passed - eGFR in normal range and within 360 days    EGFR (African American)  Date Value Ref Range Status  09/22/2013 >60  Final   GFR calc Af Amer  Date Value Ref Range Status  08/18/2017 >60 >60 mL/min Final    Comment:    (NOTE) The eGFR has been calculated using the CKD EPI equation. This calculation has not  been validated in all clinical situations. eGFR's persistently <60 mL/min signify possible Chronic Kidney Disease.    EGFR (Non-African Amer.)  Date Value Ref Range Status  09/22/2013 >60  Final    Comment:    eGFR values <9mL/min/1.73 m2 may be an indication of chronic kidney disease (CKD). Calculated eGFR is useful in patients with stable renal function. The eGFR calculation will not be reliable in acutely ill patients when serum creatinine is changing rapidly. It is not useful in  patients on dialysis. The eGFR calculation may not be applicable to patients at the low and high extremes of body sizes, pregnant women, and vegetarians.    GFR calc non Af Amer  Date Value Ref Range Status  08/18/2017 >60 >60 mL/min Final   GFR  Date Value Ref Range Status  09/23/2017 91.12 >60.00 mL/min Final   eGFR  Date Value Ref Range Status  05/15/2021 104 > OR = 60 mL/min/1.72m2 Final    Comment:    The eGFR is based on the CKD-EPI 2021 equation. To  calculate  the new eGFR from a previous Creatinine or Cystatin C result, go to https://www.kidney.org/professionals/ kdoqi/gfr%5Fcalculator          Passed - Valid encounter within last 6 months    Recent Outpatient Visits          3 months ago Hoarseness of voice   [place], DO   4 months ago Type 2 diabetes mellitus with other specified complication, with long-term current use of insulin [facility])   [facility] Olin Hauser, DO   5 months ago Psychophysiological insomnia   [place], DO   6 months ago Umbilical hernia with obstruction, without gangrene   [place], DO   8 months ago Type 2 diabetes mellitus with other specified complication, with long-term current use of insulin [facility])   [facility], Devonne Doughty, DO               .

## 2021-09-26 DIAGNOSIS — M961 Postlaminectomy syndrome, not elsewhere classified: Secondary | ICD-10-CM | POA: Diagnosis not present

## 2021-09-26 DIAGNOSIS — F17210 Nicotine dependence, cigarettes, uncomplicated: Secondary | ICD-10-CM | POA: Diagnosis not present

## 2021-09-26 DIAGNOSIS — M545 Low back pain, unspecified: Secondary | ICD-10-CM | POA: Diagnosis not present

## 2021-09-26 DIAGNOSIS — M25561 Pain in right knee: Secondary | ICD-10-CM | POA: Diagnosis not present

## 2021-09-26 DIAGNOSIS — I69351 Hemiplegia and hemiparesis following cerebral infarction affecting right dominant side: Secondary | ICD-10-CM | POA: Diagnosis not present

## 2021-09-26 DIAGNOSIS — Z79891 Long term (current) use of opiate analgesic: Secondary | ICD-10-CM | POA: Diagnosis not present

## 2021-09-30 ENCOUNTER — Ambulatory Visit: Payer: Medicare Other | Admitting: Internal Medicine

## 2021-09-30 NOTE — Progress Notes (Deleted)
Subjective:    Patient ID: [name], female    DOB: September 18, 1965, 56 y.o.   MRN: [id number]  HPI  Patient presents to clinic today with complaint of headache and dizziness.  She has history of chronic migraines and has had TIAs in the past.  She is taking Topamax as prescribed.  Review of Systems     Past Medical History:  Diagnosis Date   Anxiety    Cancer ([place])    a spot on liver and treated    Complication of anesthesia    restless,easily upset   COPD (chronic obstructive pulmonary disease) (HCC)    Depression    Diabetes mellitus without complication (HCC)    Diverticulitis    Fatty liver    GERD (gastroesophageal reflux disease)    Headache(784.0)    migraines   History of kidney stones    Hyperlipidemia    Hypertension    Pneumonia    Restless    Stroke [facility])     Current Outpatient Medications  Medication Sig Dispense Refill   albuterol (VENTOLIN HFA) 108 (90 Base) MCG/ACT inhaler USE 2 INHALATIONS BY MOUTH EVERY 4 HOURS AS NEEDED FOR WHEEZING  OR SHORTNESS OF BREATH 26.8 g 4   atorvastatin (LIPITOR) 40 MG tablet Take 1 tablet (40 mg total) by mouth at bedtime. 90 tablet 3   Buprenorphine HCl-Naloxone HCl 8-2 MG FILM Place under the tongue 3 (three) times daily.     buPROPion (WELLBUTRIN XL) 150 MG 24 hr tablet TAKE 1 TABLET BY MOUTH DAILY 90 tablet 0   citalopram (CELEXA) 40 MG tablet Take 1 tablet (40 mg total) by mouth daily. 90 tablet 1   clopidogrel (PLAVIX) 75 MG tablet Take 1 tablet (75 mg total) by mouth daily. 90 tablet 3   dicyclomine (BENTYL) 20 MG tablet Take 1 tablet (20 mg total) by mouth 4 (four) times daily -  before meals and at bedtime. As needed for abdominal pain cramping 60 tablet 2   fluticasone (FLONASE) 50 MCG/ACT nasal spray USE 2 SPRAYS IN BOTH NOSTRILS  DAILY . USE FOR 4 TO 6 WEEKS,  THEN STOP AND USE SEASONALLY OR  AS NEEDED 48 g 0   gabapentin (NEURONTIN) 600 MG tablet TAKE 1 TABLET(600 MG) BY MOUTH FOUR TIMES DAILY 360 tablet 0    metFORMIN (GLUCOPHAGE) 1000 MG tablet TAKE 1 TABLET(1000 MG) BY MOUTH TWICE DAILY WITH A MEAL 180 tablet 0   MOUNJARO 5 MG/0.5ML Pen ADMINISTER 5 MG UNDER THE SKIN 1 TIME A WEEK 2 mL 2   mupirocin ointment (BACTROBAN) 2 % APPLY TOPICALLY TO THE AFFECTED AREA TWICE DAILY FOR UP TO 7 TO 10 DAYS AS NEEDED FOR SKIN INFECTION 22 g 2   NYSTATIN powder APPLY TOPICALLY A SMALL AMOUNT TWICE DAILY AS NEEDED 60 g 2   ondansetron (ZOFRAN-ODT) 4 MG disintegrating tablet Take 1 tablet (4 mg total) by mouth every 8 (eight) hours as needed for nausea or vomiting. 30 tablet 2   pantoprazole (PROTONIX) 40 MG tablet TAKE 1 TABLET BY MOUTH EVERY DAY 90 tablet 0   pioglitazone (ACTOS) 45 MG tablet Take 45 mg by mouth daily.     QUEtiapine (SEROQUEL) 100 MG tablet Take 1 tablet (100 mg total) by mouth at bedtime. 90 tablet 1   RELISTOR 150 MG TABS Take 3 tablets by mouth every morning.     topiramate (TOPAMAX) 100 MG tablet TAKE 1 TABLET BY MOUTH TWICE  DAILY 180 tablet 0  TRELEGY ELLIPTA 100-62.5-25 MCG/ACT AEPB INHALE 1 PUFF INTO THE LUNGS DAILY 60 each 11   triamcinolone cream (KENALOG) 0.1 % Apply 1 application. topically 2 (two) times daily. 30 g 2   Vitamin D, Ergocalciferol, (DRISDOL) 50000 units CAPS capsule Take 50,000 Units by mouth every 7 (seven) days. On Wednesday     No current facility-administered medications for this visit.    Allergies  Allergen Reactions   Tramadol Nausea And Vomiting and Hypertension   Augmentin [Amoxicillin-Pot Clavulanate] Rash    Family History  Problem Relation Age of Onset   Diabetes Mother    Hypertension Mother    Hypertension Father     Social History   Socioeconomic History   Marital status: Married    Spouse name: Not on file   Number of children: Not on file   Years of education: Not on file   Highest education level: Not on file  Occupational History   Not on file  Tobacco Use   Smoking status: Every Day    Packs/day: 1.00    Years: 20.00     Total pack years: 20.00    Types: Cigarettes   Smokeless tobacco: Never  Vaping Use   Vaping Use: Former  Substance and Sexual Activity   Alcohol use: No   Drug use: Yes    Types: Marijuana    Comment: last smoked 2 days ago  8/4   Sexual activity: Not on file  Other Topics Concern   Not on file  Social History Narrative   Not on file   Social Determinants of Health   Financial Resource Strain: Medium Risk (03/04/2021)   Overall Financial Resource Strain (CARDIA)    Difficulty of Paying Living Expenses: Somewhat hard  Food Insecurity: No Food Insecurity (03/04/2021)   Hunger Vital Sign    Worried About Running Out of Food in the Last Year: Never true    Ran Out of Food in the Last Year: Never true  Transportation Needs: No Transportation Needs (03/04/2021)   PRAPARE - Hydrologist (Medical): No    Lack of Transportation (Non-Medical): No  Physical Activity: Not on file  Stress: Stress Concern Present (03/04/2021)   [place]    Feeling of Stress : To some extent  Social Connections: Moderately Isolated (03/04/2021)   Social Connection and Isolation Panel [NHANES]    Frequency of Communication with Friends and Family: More than three times a week    Frequency of Social Gatherings with Friends and Family: More than three times a week    Attends Religious Services: Never    Marine scientist or Organizations: No    Attends Archivist Meetings: Never    Marital Status: Married  Human resources officer Violence: Not on file     Constitutional: Patient reports headaches denies fever, malaise, fatigue, or abrupt weight changes.  HEENT: Denies eye pain, eye redness, ear pain, ringing in the ears, wax buildup, runny nose, nasal congestion, bloody nose, or sore throat. Respiratory: Denies difficulty breathing, shortness of breath, cough or sputum production.   Cardiovascular: Denies  chest pain, chest tightness, palpitations or swelling in the hands or feet.  Gastrointestinal: Denies abdominal pain, bloating, constipation, diarrhea or blood in the stool.  GU: Denies urgency, frequency, pain with urination, burning sensation, blood in urine, odor or discharge. Musculoskeletal: Denies decrease in range of motion, difficulty with gait, muscle pain or joint pain and swelling.  Skin: Denies redness, rashes, lesions or ulcercations.  Neurological: Patient reports intermittent dizziness.  Denies difficulty with memory, difficulty with speech or problems with balance and coordination.  Psych: Denies anxiety, depression, SI/HI.  No other specific complaints in a complete review of systems (except as listed in HPI above).  Objective:   Physical Exam   There were no vitals taken for this visit. Wt Readings from Last 3 Encounters:  07/24/21 233 lb (105.7 kg)  05/28/21 233 lb (105.7 kg)  05/15/21 233 lb (105.7 kg)    General: Appears their stated age, well developed, well nourished in NAD. Skin: Warm, dry and intact. No rashes, lesions or ulcerations noted. HEENT: Head: normal shape and size; Eyes: sclera white, no icterus, conjunctiva pink, PERRLA and EOMs intact; Ears: Tm's gray and intact, normal light reflex; Nose: mucosa pink and moist, septum midline; Throat/Mouth: Teeth present, mucosa pink and moist, no exudate, lesions or ulcerations noted.  Neck:  Neck supple, trachea midline. No masses, lumps or thyromegaly present.  Cardiovascular: Normal rate and rhythm. S1,S2 noted.  No murmur, rubs or gallops noted. No JVD or BLE edema. No carotid bruits noted. Pulmonary/Chest: Normal effort and positive vesicular breath sounds. No respiratory distress. No wheezes, rales or ronchi noted.  Abdomen: Soft and nontender. Normal bowel sounds. No distention or masses noted. Liver, spleen and kidneys non palpable. Musculoskeletal: Normal range of motion. No signs of joint swelling. No  difficulty with gait.  Neurological: Alert and oriented. Cranial nerves II-XII grossly intact. Coordination normal.  Psychiatric: Mood and affect normal. Behavior is normal. Judgment and thought content normal.    BMET    Component Value Date/Time   NA 139 05/15/2021 1351   NA 135 (L) 09/22/2013 2251   K 4.9 05/15/2021 1351   K 3.8 09/22/2013 2251   CL 102 05/15/2021 1351   CL 100 09/22/2013 2251   CO2 29 05/15/2021 1351   CO2 27 09/22/2013 2251   GLUCOSE 261 (H) 05/15/2021 1351   GLUCOSE 335 (H) 09/22/2013 2251   BUN 13 05/15/2021 1351   BUN 11 09/22/2013 2251   CREATININE 0.62 05/15/2021 1351   CALCIUM 8.6 05/15/2021 1351   CALCIUM 8.3 (L) 09/22/2013 2251   GFRNONAA >60 08/18/2017 0707   GFRNONAA >60 09/22/2013 2251   GFRAA >60 08/18/2017 0707   GFRAA >60 09/22/2013 2251    Lipid Panel     Component Value Date/Time   CHOL 136 04/07/2017 0828   CHOL 207 (H) 10/07/2012 0423   TRIG 191 (H) 04/07/2017 0828   TRIG 273 (H) 10/07/2012 0423   HDL 31 (L) 04/07/2017 0828   HDL 25 (L) 10/07/2012 0423   CHOLHDL 4.4 04/07/2017 0828   VLDL 38 04/07/2017 0828   VLDL 55 (H) 10/07/2012 0423   LDLCALC 67 04/07/2017 0828   LDLCALC 127 (H) 10/07/2012 0423    CBC    Component Value Date/Time   WBC 9.5 08/18/2017 0707   RBC 4.94 08/18/2017 0707   HGB 15.3 (H) 08/18/2017 1319   HGB 14.6 09/22/2013 2251   HCT 45.0 08/18/2017 1319   HCT 44.3 09/22/2013 2251   PLT 316 08/18/2017 0707   PLT 300 09/22/2013 2251   MCV 89.5 08/18/2017 0707   MCV 90 09/22/2013 2251   MCH 28.5 08/18/2017 0707   MCHC 31.9 08/18/2017 0707   RDW 13.8 08/18/2017 0707   RDW 13.8 09/22/2013 2251   LYMPHSABS 2.1 08/18/2017 0707   LYMPHSABS 4.5 (H) 08/02/2013 2118   MONOABS 0.6 08/18/2017  4627   MONOABS 1.3 (H) 08/02/2013 2118   EOSABS 0.2 08/18/2017 0707   EOSABS 0.1 08/02/2013 2118   BASOSABS 0.1 08/18/2017 0707   BASOSABS 0.0 08/02/2013 2118    Hgb A1C Lab Results  Component Value Date    HGBA1C 9.7 (H) 05/15/2021           Assessment & Plan:    Follow-up with your PCP as previously scheduled. Webb Silversmith, NP

## 2021-10-03 ENCOUNTER — Ambulatory Visit: Payer: Medicare Other | Admitting: Internal Medicine

## 2021-10-03 NOTE — Progress Notes (Deleted)
Subjective:    Patient ID: [name], female    DOB: Dec 03, 1965, 56 y.o.   MRN: [id number]  HPI  Patient presents to clinic today with complaint of headaches and dizziness.  She has a history of chronic headaches, managed on Topamax.  MRI brain from 2019 showed:IMPRESSION: 1. No acute finding. 2. Chronic small vessel ischemia. Remote left corona radiata perforator infarct.   Review of Systems     Past Medical History:  Diagnosis Date   Anxiety    Cancer ([place])    a spot on liver and treated    Complication of anesthesia    restless,easily upset   COPD (chronic obstructive pulmonary disease) (HCC)    Depression    Diabetes mellitus without complication (HCC)    Diverticulitis    Fatty liver    GERD (gastroesophageal reflux disease)    Headache(784.0)    migraines   History of kidney stones    Hyperlipidemia    Hypertension    Pneumonia    Restless    Stroke [facility])     Current Outpatient Medications  Medication Sig Dispense Refill   albuterol (VENTOLIN HFA) 108 (90 Base) MCG/ACT inhaler USE 2 INHALATIONS BY MOUTH EVERY 4 HOURS AS NEEDED FOR WHEEZING  OR SHORTNESS OF BREATH 26.8 g 4   atorvastatin (LIPITOR) 40 MG tablet Take 1 tablet (40 mg total) by mouth at bedtime. 90 tablet 3   Buprenorphine HCl-Naloxone HCl 8-2 MG FILM Place under the tongue 3 (three) times daily.     buPROPion (WELLBUTRIN XL) 150 MG 24 hr tablet TAKE 1 TABLET BY MOUTH DAILY 90 tablet 0   citalopram (CELEXA) 40 MG tablet Take 1 tablet (40 mg total) by mouth daily. 90 tablet 1   clopidogrel (PLAVIX) 75 MG tablet Take 1 tablet (75 mg total) by mouth daily. 90 tablet 3   dicyclomine (BENTYL) 20 MG tablet Take 1 tablet (20 mg total) by mouth 4 (four) times daily -  before meals and at bedtime. As needed for abdominal pain cramping 60 tablet 2   fluticasone (FLONASE) 50 MCG/ACT nasal spray USE 2 SPRAYS IN BOTH NOSTRILS  DAILY . USE FOR 4 TO 6 WEEKS,  THEN STOP AND USE SEASONALLY OR  AS NEEDED 48 g 0    gabapentin (NEURONTIN) 600 MG tablet TAKE 1 TABLET(600 MG) BY MOUTH FOUR TIMES DAILY 360 tablet 0   metFORMIN (GLUCOPHAGE) 1000 MG tablet TAKE 1 TABLET(1000 MG) BY MOUTH TWICE DAILY WITH A MEAL 180 tablet 0   MOUNJARO 5 MG/0.5ML Pen ADMINISTER 5 MG UNDER THE SKIN 1 TIME A WEEK 2 mL 2   mupirocin ointment (BACTROBAN) 2 % APPLY TOPICALLY TO THE AFFECTED AREA TWICE DAILY FOR UP TO 7 TO 10 DAYS AS NEEDED FOR SKIN INFECTION 22 g 2   NYSTATIN powder APPLY TOPICALLY A SMALL AMOUNT TWICE DAILY AS NEEDED 60 g 2   ondansetron (ZOFRAN-ODT) 4 MG disintegrating tablet Take 1 tablet (4 mg total) by mouth every 8 (eight) hours as needed for nausea or vomiting. 30 tablet 2   pantoprazole (PROTONIX) 40 MG tablet TAKE 1 TABLET BY MOUTH EVERY DAY 90 tablet 0   pioglitazone (ACTOS) 45 MG tablet Take 45 mg by mouth daily.     QUEtiapine (SEROQUEL) 100 MG tablet Take 1 tablet (100 mg total) by mouth at bedtime. 90 tablet 1   RELISTOR 150 MG TABS Take 3 tablets by mouth every morning.     topiramate (TOPAMAX) 100 MG tablet TAKE  1 TABLET BY MOUTH TWICE  DAILY 180 tablet 0   TRELEGY ELLIPTA 100-62.5-25 MCG/ACT AEPB INHALE 1 PUFF INTO THE LUNGS DAILY 60 each 11   triamcinolone cream (KENALOG) 0.1 % Apply 1 application. topically 2 (two) times daily. 30 g 2   Vitamin D, Ergocalciferol, (DRISDOL) 50000 units CAPS capsule Take 50,000 Units by mouth every 7 (seven) days. On Wednesday     No current facility-administered medications for this visit.    Allergies  Allergen Reactions   Tramadol Nausea And Vomiting and Hypertension   Augmentin [Amoxicillin-Pot Clavulanate] Rash    Family History  Problem Relation Age of Onset   Diabetes Mother    Hypertension Mother    Hypertension Father     Social History   Socioeconomic History   Marital status: Married    Spouse name: Not on file   Number of children: Not on file   Years of education: Not on file   Highest education level: Not on file  Occupational History    Not on file  Tobacco Use   Smoking status: Every Day    Packs/day: 1.00    Years: 20.00    Total pack years: 20.00    Types: Cigarettes   Smokeless tobacco: Never  Vaping Use   Vaping Use: Former  Substance and Sexual Activity   Alcohol use: No   Drug use: Yes    Types: Marijuana    Comment: last smoked 2 days ago  8/4   Sexual activity: Not on file  Other Topics Concern   Not on file  Social History Narrative   Not on file   Social Determinants of Health   Financial Resource Strain: Medium Risk (03/04/2021)   Overall Financial Resource Strain (CARDIA)    Difficulty of Paying Living Expenses: Somewhat hard  Food Insecurity: No Food Insecurity (03/04/2021)   Hunger Vital Sign    Worried About Running Out of Food in the Last Year: Never true    Ran Out of Food in the Last Year: Never true  Transportation Needs: No Transportation Needs (03/04/2021)   PRAPARE - Hydrologist (Medical): No    Lack of Transportation (Non-Medical): No  Physical Activity: Not on file  Stress: Stress Concern Present (03/04/2021)   [place]    Feeling of Stress : To some extent  Social Connections: Moderately Isolated (03/04/2021)   Social Connection and Isolation Panel [NHANES]    Frequency of Communication with Friends and Family: More than three times a week    Frequency of Social Gatherings with Friends and Family: More than three times a week    Attends Religious Services: Never    Marine scientist or Organizations: No    Attends Archivist Meetings: Never    Marital Status: Married  Human resources officer Violence: Not on file     Constitutional: Patient reports headaches.  Denies fever, malaise, fatigue, or abrupt weight changes.  HEENT: Denies eye pain, eye redness, ear pain, ringing in the ears, wax buildup, runny nose, nasal congestion, bloody nose, or sore throat. Respiratory:  Denies difficulty breathing, shortness of breath, cough or sputum production.   Cardiovascular: Denies chest pain, chest tightness, palpitations or swelling in the hands or feet.  Gastrointestinal: Denies abdominal pain, bloating, constipation, diarrhea or blood in the stool.  GU: Denies urgency, frequency, pain with urination, burning sensation, blood in urine, odor or discharge. Musculoskeletal: Denies decrease in range  of motion, difficulty with gait, muscle pain or joint pain and swelling.  Skin: Denies redness, rashes, lesions or ulcercations.  Neurological: Patient reports intermittent dizziness.  Denies difficulty with memory, difficulty with speech or problems with balance and coordination.  Psych: Denies anxiety, depression, SI/HI.  No other specific complaints in a complete review of systems (except as listed in HPI above).  Objective:   Physical Exam   There were no vitals taken for this visit. Wt Readings from Last 3 Encounters:  07/24/21 233 lb (105.7 kg)  05/28/21 233 lb (105.7 kg)  05/15/21 233 lb (105.7 kg)    General: Appears their stated age, well developed, well nourished in NAD. Skin: Warm, dry and intact. No rashes, lesions or ulcerations noted. HEENT: Head: normal shape and size; Eyes: sclera white, no icterus, conjunctiva pink, PERRLA and EOMs intact; Ears: Tm's gray and intact, normal light reflex; Nose: mucosa pink and moist, septum midline; Throat/Mouth: Teeth present, mucosa pink and moist, no exudate, lesions or ulcerations noted.  Neck:  Neck supple, trachea midline. No masses, lumps or thyromegaly present.  Cardiovascular: Normal rate and rhythm. S1,S2 noted.  No murmur, rubs or gallops noted. No JVD or BLE edema. No carotid bruits noted. Pulmonary/Chest: Normal effort and positive vesicular breath sounds. No respiratory distress. No wheezes, rales or ronchi noted.  Abdomen: Soft and nontender. Normal bowel sounds. No distention or masses noted. Liver, spleen  and kidneys non palpable. Musculoskeletal: Normal range of motion. No signs of joint swelling. No difficulty with gait.  Neurological: Alert and oriented. Cranial nerves II-XII grossly intact. Coordination normal.  Psychiatric: Mood and affect normal. Behavior is normal. Judgment and thought content normal.    BMET    Component Value Date/Time   NA 139 05/15/2021 1351   NA 135 (L) 09/22/2013 2251   K 4.9 05/15/2021 1351   K 3.8 09/22/2013 2251   CL 102 05/15/2021 1351   CL 100 09/22/2013 2251   CO2 29 05/15/2021 1351   CO2 27 09/22/2013 2251   GLUCOSE 261 (H) 05/15/2021 1351   GLUCOSE 335 (H) 09/22/2013 2251   BUN 13 05/15/2021 1351   BUN 11 09/22/2013 2251   CREATININE 0.62 05/15/2021 1351   CALCIUM 8.6 05/15/2021 1351   CALCIUM 8.3 (L) 09/22/2013 2251   GFRNONAA >60 08/18/2017 0707   GFRNONAA >60 09/22/2013 2251   GFRAA >60 08/18/2017 0707   GFRAA >60 09/22/2013 2251    Lipid Panel     Component Value Date/Time   CHOL 136 04/07/2017 0828   CHOL 207 (H) 10/07/2012 0423   TRIG 191 (H) 04/07/2017 0828   TRIG 273 (H) 10/07/2012 0423   HDL 31 (L) 04/07/2017 0828   HDL 25 (L) 10/07/2012 0423   CHOLHDL 4.4 04/07/2017 0828   VLDL 38 04/07/2017 0828   VLDL 55 (H) 10/07/2012 0423   LDLCALC 67 04/07/2017 0828   LDLCALC 127 (H) 10/07/2012 0423    CBC    Component Value Date/Time   WBC 9.5 08/18/2017 0707   RBC 4.94 08/18/2017 0707   HGB 15.3 (H) 08/18/2017 1319   HGB 14.6 09/22/2013 2251   HCT 45.0 08/18/2017 1319   HCT 44.3 09/22/2013 2251   PLT 316 08/18/2017 0707   PLT 300 09/22/2013 2251   MCV 89.5 08/18/2017 0707   MCV 90 09/22/2013 2251   MCH 28.5 08/18/2017 0707   MCHC 31.9 08/18/2017 0707   RDW 13.8 08/18/2017 0707   RDW 13.8 09/22/2013 2251   LYMPHSABS 2.1 08/18/2017  6950   LYMPHSABS 4.5 (H) 08/02/2013 2118   MONOABS 0.6 08/18/2017 0707   MONOABS 1.3 (H) 08/02/2013 2118   EOSABS 0.2 08/18/2017 0707   EOSABS 0.1 08/02/2013 2118   BASOSABS 0.1  08/18/2017 0707   BASOSABS 0.0 08/02/2013 2118    Hgb A1C Lab Results  Component Value Date   HGBA1C 9.7 (H) 05/15/2021           Assessment & Plan:   Follow-up with your PCP as previously scheduled. Webb Silversmith, NP

## 2021-10-08 ENCOUNTER — Ambulatory Visit: Payer: Self-pay

## 2021-10-08 NOTE — Telephone Encounter (Signed)
  Chief Complaint: sinus congestion Symptoms: sinus congestion, HA, dizziness, ear pain bilat but L is worse.  Frequency: 4-5 days Pertinent Negatives: Patient denies SOB, or drainage Disposition: '[]'$ ED /'[]'$ Urgent Care (no appt availability in office) / '[x]'$ Appointment(In office/virtual)/ '[]'$  [facility]/ '[]'$ Home Care/ '[]'$ Refused Recommended Disposition /'[]'$ [facility]/ '[]'$  Follow-up with PCP Additional Notes: rescheduled pt's appt for VV on 10/10/21 with Rollene Fare, NP for first available. Pt isnt taking any meds OTC for sx. Advised to try Sudafed or Tylenol products. Pt also wanted to note that dizziness feels like when she had vertigo before.   Summary: Congestion/Headache/Dizziness   Patient called in stating she has been congested and has ear pain and it sounds like she is underwater when she swallows. She also has a headache and dizziness. She has already been scheduled per her request for next Friday with Dr Parks Ranger. She was offered sooner but wanted to wait to see her provider on Friday. Please assist patient further      Reason for Disposition  [1] Sinus pain (not just congestion) AND [2] fever  Answer Assessment - Initial Assessment Questions 1. LOCATION: "Where does it hurt?"      HA 2. ONSET: "When did the sinus pain start?"  (e.g., hours, days)      4-5 days 3. SEVERITY: "How bad is the pain?"   (Scale 1-10; mild, moderate or severe)   - MILD (1-3): doesn't interfere with normal activities    - MODERATE (4-7): interferes with normal activities (e.g., work or school) or awakens from sleep   - SEVERE (8-10): excruciating pain and patient unable to do any normal activities        7/10 5. NASAL CONGESTION: "Is the nose blocked?" If Yes, ask: "Can you open it or must you breathe through your mouth?"     Stuffy nose 7. FEVER: "Do you have a fever?" If Yes, ask: "What is it, how was it measured, and when did it start?"      If so low grade 8. OTHER SYMPTOMS: "Do you  have any other symptoms?" (e.g., sore throat, cough, earache, difficulty breathing)     HA, dizziness, congestion, bilat ear pain (L worse than R)  Protocols used: Sinus Pain or Congestion-A-AH

## 2021-10-10 ENCOUNTER — Telehealth (INDEPENDENT_AMBULATORY_CARE_PROVIDER_SITE_OTHER): Payer: Medicare Other | Admitting: Internal Medicine

## 2021-10-10 ENCOUNTER — Encounter: Payer: Self-pay | Admitting: Internal Medicine

## 2021-10-10 DIAGNOSIS — B9689 Other specified bacterial agents as the cause of diseases classified elsewhere: Secondary | ICD-10-CM

## 2021-10-10 DIAGNOSIS — G8929 Other chronic pain: Secondary | ICD-10-CM | POA: Diagnosis not present

## 2021-10-10 DIAGNOSIS — Z79891 Long term (current) use of opiate analgesic: Secondary | ICD-10-CM | POA: Diagnosis not present

## 2021-10-10 DIAGNOSIS — M961 Postlaminectomy syndrome, not elsewhere classified: Secondary | ICD-10-CM | POA: Diagnosis not present

## 2021-10-10 DIAGNOSIS — J019 Acute sinusitis, unspecified: Secondary | ICD-10-CM | POA: Diagnosis not present

## 2021-10-10 DIAGNOSIS — R42 Dizziness and giddiness: Secondary | ICD-10-CM | POA: Diagnosis not present

## 2021-10-10 DIAGNOSIS — I69351 Hemiplegia and hemiparesis following cerebral infarction affecting right dominant side: Secondary | ICD-10-CM | POA: Diagnosis not present

## 2021-10-10 DIAGNOSIS — F17210 Nicotine dependence, cigarettes, uncomplicated: Secondary | ICD-10-CM | POA: Diagnosis not present

## 2021-10-10 DIAGNOSIS — M545 Low back pain, unspecified: Secondary | ICD-10-CM | POA: Diagnosis not present

## 2021-10-10 DIAGNOSIS — M25561 Pain in right knee: Secondary | ICD-10-CM | POA: Diagnosis not present

## 2021-10-10 MED ORDER — AZITHROMYCIN 250 MG PO TABS: ORAL_TABLET | ORAL | 0 refills | Status: AC

## 2021-10-10 MED ORDER — MECLIZINE HCL 25 MG PO TABS: 25.0000 mg | ORAL_TABLET | Freq: Three times a day (TID) | ORAL | 0 refills | Status: DC | PRN

## 2021-10-10 MED ORDER — PREDNISONE 10 MG PO TABS: 10.0000 mg | ORAL_TABLET | Freq: Every day | ORAL | 0 refills | Status: DC

## 2021-10-10 NOTE — Progress Notes (Signed)
Virtual Visit via Video Note  I connected with [name] on 10/10/21 at 11:20 AM EDT by a video enabled telemedicine application and verified that I am speaking with the correct person using two identifiers.  Location: Patient: Home Provider: Office  Persons participating in this video call: Webb Silversmith, NP and Noralee Chars.   I discussed the limitations of evaluation and management by telemedicine and the availability of in person appointments. The patient expressed understanding and agreed to proceed.  History of Present Illness:  Patient reports headache, nasal congestion, ear pain and dizziness.  She reports this started a little over a week ago.  The headache is located in her forehead.  She describes the pain as pressure. She denies vision changes,sensitivity to light and sound.  She reports the dizziness is a sensation that the room is spinning.  It seems worse with position changes and head movements.  She has had vertigo in the past and reports this feels the same.  She reports she is blowing yellow mucus out of her nose.  She reports her ears feel like she is underwater but she also has a sharp stabbing pain in her left ear.  She denies runny nose, sore throat, cough or shortness of breath at this time.  She has a history of COPD.  She has not had sick contacts that she is aware of.  She has not taken a COVID test.  She has not tried any medications OTC for this.   Past Medical History:  Diagnosis Date   Anxiety    Cancer ([place])    a spot on liver and treated    Complication of anesthesia    restless,easily upset   COPD (chronic obstructive pulmonary disease) (HCC)    Depression    Diabetes mellitus without complication (HCC)    Diverticulitis    Fatty liver    GERD (gastroesophageal reflux disease)    Headache(784.0)    migraines   History of kidney stones    Hyperlipidemia    Hypertension    Pneumonia    Restless    Stroke [facility])     Current Outpatient  Medications  Medication Sig Dispense Refill   albuterol (VENTOLIN HFA) 108 (90 Base) MCG/ACT inhaler USE 2 INHALATIONS BY MOUTH EVERY 4 HOURS AS NEEDED FOR WHEEZING  OR SHORTNESS OF BREATH 26.8 g 4   atorvastatin (LIPITOR) 40 MG tablet Take 1 tablet (40 mg total) by mouth at bedtime. 90 tablet 3   Buprenorphine HCl-Naloxone HCl 8-2 MG FILM Place under the tongue 3 (three) times daily.     buPROPion (WELLBUTRIN XL) 150 MG 24 hr tablet TAKE 1 TABLET BY MOUTH DAILY 90 tablet 0   citalopram (CELEXA) 40 MG tablet Take 1 tablet (40 mg total) by mouth daily. 90 tablet 1   clopidogrel (PLAVIX) 75 MG tablet Take 1 tablet (75 mg total) by mouth daily. 90 tablet 3   dicyclomine (BENTYL) 20 MG tablet Take 1 tablet (20 mg total) by mouth 4 (four) times daily -  before meals and at bedtime. As needed for abdominal pain cramping 60 tablet 2   fluticasone (FLONASE) 50 MCG/ACT nasal spray USE 2 SPRAYS IN BOTH NOSTRILS  DAILY . USE FOR 4 TO 6 WEEKS,  THEN STOP AND USE SEASONALLY OR  AS NEEDED 48 g 0   gabapentin (NEURONTIN) 600 MG tablet TAKE 1 TABLET(600 MG) BY MOUTH FOUR TIMES DAILY 360 tablet 0   metFORMIN (GLUCOPHAGE) 1000 MG tablet TAKE 1 TABLET(1000  MG) BY MOUTH TWICE DAILY WITH A MEAL 180 tablet 0   MOUNJARO 5 MG/0.5ML Pen ADMINISTER 5 MG UNDER THE SKIN 1 TIME A WEEK 2 mL 2   mupirocin ointment (BACTROBAN) 2 % APPLY TOPICALLY TO THE AFFECTED AREA TWICE DAILY FOR UP TO 7 TO 10 DAYS AS NEEDED FOR SKIN INFECTION 22 g 2   NYSTATIN powder APPLY TOPICALLY A SMALL AMOUNT TWICE DAILY AS NEEDED 60 g 2   ondansetron (ZOFRAN-ODT) 4 MG disintegrating tablet Take 1 tablet (4 mg total) by mouth every 8 (eight) hours as needed for nausea or vomiting. 30 tablet 2   pantoprazole (PROTONIX) 40 MG tablet TAKE 1 TABLET BY MOUTH EVERY DAY 90 tablet 0   pioglitazone (ACTOS) 45 MG tablet Take 45 mg by mouth daily.     QUEtiapine (SEROQUEL) 100 MG tablet Take 1 tablet (100 mg total) by mouth at bedtime. 90 tablet 1   RELISTOR 150  MG TABS Take 3 tablets by mouth every morning.     topiramate (TOPAMAX) 100 MG tablet TAKE 1 TABLET BY MOUTH TWICE  DAILY 180 tablet 0   TRELEGY ELLIPTA 100-62.5-25 MCG/ACT AEPB INHALE 1 PUFF INTO THE LUNGS DAILY 60 each 11   triamcinolone cream (KENALOG) 0.1 % Apply 1 application. topically 2 (two) times daily. 30 g 2   Vitamin D, Ergocalciferol, (DRISDOL) 50000 units CAPS capsule Take 50,000 Units by mouth every 7 (seven) days. On Wednesday     No current facility-administered medications for this visit.    Allergies  Allergen Reactions   Tramadol Nausea And Vomiting and Hypertension   Augmentin [Amoxicillin-Pot Clavulanate] Rash    Family History  Problem Relation Age of Onset   Diabetes Mother    Hypertension Mother    Hypertension Father     Social History   Socioeconomic History   Marital status: Married    Spouse name: Not on file   Number of children: Not on file   Years of education: Not on file   Highest education level: Not on file  Occupational History   Not on file  Tobacco Use   Smoking status: Every Day    Packs/day: 1.00    Years: 20.00    Total pack years: 20.00    Types: Cigarettes   Smokeless tobacco: Never  Vaping Use   Vaping Use: Former  Substance and Sexual Activity   Alcohol use: No   Drug use: Yes    Types: Marijuana    Comment: last smoked 2 days ago  8/4   Sexual activity: Not on file  Other Topics Concern   Not on file  Social History Narrative   Not on file   Social Determinants of Health   Financial Resource Strain: Medium Risk (03/04/2021)   Overall Financial Resource Strain (CARDIA)    Difficulty of Paying Living Expenses: Somewhat hard  Food Insecurity: No Food Insecurity (03/04/2021)   Hunger Vital Sign    Worried About Running Out of Food in the Last Year: Never true    Ran Out of Food in the Last Year: Never true  Transportation Needs: No Transportation Needs (03/04/2021)   PRAPARE - Radiographer, therapeutic (Medical): No    Lack of Transportation (Non-Medical): No  Physical Activity: Not on file  Stress: Stress Concern Present (03/04/2021)   [place]    Feeling of Stress : To some extent  Social Connections: Moderately Isolated (03/04/2021)  Social Licensed conveyancer [NHANES]    Frequency of Communication with Friends and Family: More than three times a week    Frequency of Social Gatherings with Friends and Family: More than three times a week    Attends Religious Services: Never    Marine scientist or Organizations: No    Attends Archivist Meetings: Never    Marital Status: Married  Human resources officer Violence: Not on file     Constitutional: Patient reports headache.  Denies fever, malaise, fatigue, or abrupt weight changes.  HEENT: Patient reports ear pain and nasal congestion.  Denies eye pain, eye redness, ringing in the ears, wax buildup, runny nose, bloody nose, or sore throat. Respiratory: Denies difficulty breathing, shortness of breath, cough or sputum production.   Cardiovascular: Denies chest pain, chest tightness, palpitations or swelling in the hands or feet.  Gastrointestinal: Denies abdominal pain, bloating, constipation, diarrhea or blood in the stool.  Neurological: Patient reports dizziness.  Denies difficulty with memory, difficulty with speech or problems with balance and coordination.  .  No other specific complaints in a complete review of systems (except as listed in HPI above).    Observations/Objective:   Wt Readings from Last 3 Encounters:  07/24/21 233 lb (105.7 kg)  05/28/21 233 lb (105.7 kg)  05/15/21 233 lb (105.7 kg)    General: Appears her stated age, chronically ill-appearing, in NAD. HEENT:  Nose: Congestion noted; Throat/Mouth: Hoarseness noted.  Pulmonary/Chest: Normal effort. No respiratory distress.  Neurological: Alert and  oriented.  Coordination normal.     BMET    Component Value Date/Time   NA 139 05/15/2021 1351   NA 135 (L) 09/22/2013 2251   K 4.9 05/15/2021 1351   K 3.8 09/22/2013 2251   CL 102 05/15/2021 1351   CL 100 09/22/2013 2251   CO2 29 05/15/2021 1351   CO2 27 09/22/2013 2251   GLUCOSE 261 (H) 05/15/2021 1351   GLUCOSE 335 (H) 09/22/2013 2251   BUN 13 05/15/2021 1351   BUN 11 09/22/2013 2251   CREATININE 0.62 05/15/2021 1351   CALCIUM 8.6 05/15/2021 1351   CALCIUM 8.3 (L) 09/22/2013 2251   GFRNONAA >60 08/18/2017 0707   GFRNONAA >60 09/22/2013 2251   GFRAA >60 08/18/2017 0707   GFRAA >60 09/22/2013 2251    Lipid Panel     Component Value Date/Time   CHOL 136 04/07/2017 0828   CHOL 207 (H) 10/07/2012 0423   TRIG 191 (H) 04/07/2017 0828   TRIG 273 (H) 10/07/2012 0423   HDL 31 (L) 04/07/2017 0828   HDL 25 (L) 10/07/2012 0423   CHOLHDL 4.4 04/07/2017 0828   VLDL 38 04/07/2017 0828   VLDL 55 (H) 10/07/2012 0423   LDLCALC 67 04/07/2017 0828   LDLCALC 127 (H) 10/07/2012 0423    CBC    Component Value Date/Time   WBC 9.5 08/18/2017 0707   RBC 4.94 08/18/2017 0707   HGB 15.3 (H) 08/18/2017 1319   HGB 14.6 09/22/2013 2251   HCT 45.0 08/18/2017 1319   HCT 44.3 09/22/2013 2251   PLT 316 08/18/2017 0707   PLT 300 09/22/2013 2251   MCV 89.5 08/18/2017 0707   MCV 90 09/22/2013 2251   MCH 28.5 08/18/2017 0707   MCHC 31.9 08/18/2017 0707   RDW 13.8 08/18/2017 0707   RDW 13.8 09/22/2013 2251   LYMPHSABS 2.1 08/18/2017 0707   LYMPHSABS 4.5 (H) 08/02/2013 2118   MONOABS 0.6 08/18/2017 0707   MONOABS 1.3 (H)  08/02/2013 2118   EOSABS 0.2 08/18/2017 0707   EOSABS 0.1 08/02/2013 2118   BASOSABS 0.1 08/18/2017 0707   BASOSABS 0.0 08/02/2013 2118    Hgb A1C Lab Results  Component Value Date   HGBA1C 9.7 (H) 05/15/2021       Assessment and Plan:  Acute Headache, Nasal Congestion, Ear Fullness, Dizziness, COPD:  No need to test for COVID at this time as it has been  too long to treat with antivirals Sinusitis versus URI Rx for Prednisone 10 mg daily x5 days-monitor sugars and expect elevations Rx for Azithromycin 250 mg x 5 days Rx for Meclizine 25 mg 3 times daily as needed  If symptoms persist or worsen, we would recommend that you follow-up in the office with PCP  Follow Up Instructions:    I discussed the assessment and treatment plan with the patient. The patient was provided an opportunity to ask questions and all were answered. The patient agreed with the plan and demonstrated an understanding of the instructions.   The patient was advised to call back or seek an in-person evaluation if the symptoms worsen or if the condition fails to improve as anticipated.   Webb Silversmith, NP

## 2021-10-10 NOTE — Patient Instructions (Signed)
Vertigo Vertigo is the feeling that you or the things around you are moving when they are not. This feeling can come and go at any time. Vertigo often goes away on its own. This condition can be dangerous if it happens when you are doing activities like driving or working with machines. Your doctor will do tests to find the cause of your vertigo. These tests will also help your doctor decide on the best treatment for you. Follow these instructions at home: Eating and drinking     Drink enough fluid to keep your pee (urine) pale yellow. Do not drink alcohol. Activity Return to your normal activities when your doctor says that it is safe. In the morning, first sit up on the side of the bed. When you feel okay, stand slowly while you hold onto something until you know that your balance is fine. Move slowly. Avoid sudden body or head movements or certain positions, as told by your doctor. Use a cane if you have trouble standing or walking. Sit down right away if you feel dizzy. Avoid doing any tasks or activities that can cause danger to you or others if you get dizzy. Avoid bending down if you feel dizzy. Place items in your home so that they are easy for you to reach without bending or leaning over. Do not drive or use machinery if you feel dizzy. General instructions Take over-the-counter and prescription medicines only as told by your doctor. Keep all follow-up visits. Contact a doctor if: Your medicine does not help your vertigo. Your problems get worse or you have new symptoms. You have a fever. You feel like you may vomit (nauseous), or this feeling gets worse. You start to vomit. Your family or friends see changes in how you act. You lose feeling (have numbness) in part of your body. You feel prickling and tingling in a part of your body. Get help right away if: You are always dizzy. You faint. You get very bad headaches. You get a stiff neck. Bright light starts to bother  you. You have trouble moving or talking. You feel weak in your hands, arms, or legs. You have changes in your hearing or in how you see (vision). These symptoms may be an emergency. Get help right away. Call your local emergency services (911 in the [place] Do not wait to see if the symptoms will go away. Do not drive yourself to the hospital. Summary Vertigo is the feeling that you or the things around you are moving when they are not. Your doctor will do tests to find the cause of your vertigo. You may be told to avoid some tasks, positions, or movements. Contact a doctor if your medicine is not helping, or if you have a fever, new symptoms, or a change in how you act. Get help right away if you get very bad headaches, or if you have changes in how you speak, hear, or see. This information is not intended to replace advice given to you by your health care provider. Make sure you discuss any questions you have with your health care provider. Document Revised: 11/30/2019 Document Reviewed: 11/30/2019 Elsevier Patient Education  2023 Elsevier Inc.  

## 2021-10-14 ENCOUNTER — Telehealth: Payer: Self-pay | Admitting: Pharmacist

## 2021-10-14 NOTE — Telephone Encounter (Signed)
  Chronic Care Management   Outreach Note  10/14/2021 Name: [name] MRN: [id number] DOB: 1965-03-21  Referred by: Olin Hauser, DO Reason for referral : No chief complaint on file.   Was unable to reach patient via telephone today and have left HIPAA compliant voicemail asking patient to return my call.   Follow Up Plan: Will collaborate with Care Guide to outreach to schedule follow up with me  Wallace Cullens, PharmD, [facility] [phone]

## 2021-10-16 ENCOUNTER — Other Ambulatory Visit: Payer: Self-pay | Admitting: Family Medicine

## 2021-10-16 DIAGNOSIS — F5104 Psychophysiologic insomnia: Secondary | ICD-10-CM

## 2021-10-16 DIAGNOSIS — H6993 Unspecified Eustachian tube disorder, bilateral: Secondary | ICD-10-CM

## 2021-10-16 DIAGNOSIS — E782 Mixed hyperlipidemia: Secondary | ICD-10-CM

## 2021-10-16 DIAGNOSIS — I6930 Unspecified sequelae of cerebral infarction: Secondary | ICD-10-CM

## 2021-10-16 DIAGNOSIS — G43711 Chronic migraine without aura, intractable, with status migrainosus: Secondary | ICD-10-CM

## 2021-10-16 DIAGNOSIS — K219 Gastro-esophageal reflux disease without esophagitis: Secondary | ICD-10-CM

## 2021-10-16 DIAGNOSIS — F331 Major depressive disorder, recurrent, moderate: Secondary | ICD-10-CM

## 2021-10-16 DIAGNOSIS — J321 Chronic frontal sinusitis: Secondary | ICD-10-CM

## 2021-10-16 NOTE — Telephone Encounter (Signed)
Requested Prescriptions  Pending Prescriptions Disp Refills  . atorvastatin (LIPITOR) 40 MG tablet [Pharmacy Med Name: Atorvastatin Calcium 40 MG Oral Tablet] 90 tablet 3    Sig: TAKE 1 TABLET BY MOUTH AT  BEDTIME     Cardiovascular:  Antilipid - Statins Failed - 10/16/2021  2:56 PM      Failed - Lipid Panel in normal range within the last 12 months    Cholesterol  Date Value Ref Range Status  04/07/2017 136 0 - 200 mg/dL Final  10/07/2012 207 (H) 0 - 200 mg/dL Final   Ldl Cholesterol, Calc  Date Value Ref Range Status  10/07/2012 127 (H) 0 - 100 mg/dL Final   LDL Cholesterol  Date Value Ref Range Status  04/07/2017 67 0 - 99 mg/dL Final    Comment:           Total Cholesterol/HDL:CHD Risk Coronary Heart Disease Risk Table                     Men   Women  1/2 Average Risk   3.4   3.3  Average Risk       5.0   4.4  2 X Average Risk   9.6   7.1  3 X Average Risk  23.4   11.0        Use the calculated Patient Ratio above and the CHD Risk Table to determine the patient's CHD Risk.        ATP III CLASSIFICATION (LDL):  <100     mg/dL   Optimal  100-129  mg/dL   Near or Above                    Optimal  130-159  mg/dL   Borderline  160-189  mg/dL   High  >190     mg/dL   Very High Performed at [facility] [address] [place], [place] [address]    HDL Cholesterol  Date Value Ref Range Status  10/07/2012 25 (L) 40 - 60 mg/dL Final   HDL  Date Value Ref Range Status  04/07/2017 31 (L) >40 mg/dL Final   Triglycerides  Date Value Ref Range Status  04/07/2017 191 (H) <150 mg/dL Final  10/07/2012 273 (H) 0 - 200 mg/dL Final         Passed - Patient is not pregnant      Passed - Valid encounter within last 12 months    Recent Outpatient Visits          6 days ago Acute bacterial sinusitis   [facility] [place], Coralie Keens, NP   4 months ago Hoarseness of voice   [facility] [place], Devonne Doughty, DO   5 months ago Type 2  diabetes mellitus with other specified complication, with long-term current use of insulin [facility])   [facility] Olin Hauser, DO   6 months ago Psychophysiological insomnia   [place], DO   7 months ago Umbilical hernia with obstruction, without gangrene   [facility], Devonne Doughty, DO             . QUEtiapine (SEROQUEL) 100 MG tablet [Pharmacy Med Name: QUEtiapine Fumarate 100 MG Oral Tablet] 90 tablet 3    Sig: TAKE 1 TABLET BY MOUTH AT  BEDTIME     Not Delegated - Psychiatry:  Antipsychotics - Second Generation (Atypical) -  quetiapine Failed - 10/16/2021  2:56 PM      Failed - This refill cannot be delegated      Failed - TSH in normal range and within 360 days    TSH  Date Value Ref Range Status  08/09/2017 1.086 0.350 - 4.500 uIU/mL Final    Comment:    Performed by a 3rd Generation assay with a functional sensitivity of <=0.01 uIU/mL. Performed at [facility], [address] [place], [place] [address]          Failed - Lipid Panel in normal range within the last 12 months    Cholesterol  Date Value Ref Range Status  04/07/2017 136 0 - 200 mg/dL Final  10/07/2012 207 (H) 0 - 200 mg/dL Final   Ldl Cholesterol, Calc  Date Value Ref Range Status  10/07/2012 127 (H) 0 - 100 mg/dL Final   LDL Cholesterol  Date Value Ref Range Status  04/07/2017 67 0 - 99 mg/dL Final    Comment:           Total Cholesterol/HDL:CHD Risk Coronary Heart Disease Risk Table                     Men   Women  1/2 Average Risk   3.4   3.3  Average Risk       5.0   4.4  2 X Average Risk   9.6   7.1  3 X Average Risk  23.4   11.0        Use the calculated Patient Ratio above and the CHD Risk Table to determine the patient's CHD Risk.        ATP III CLASSIFICATION (LDL):  <100     mg/dL   Optimal  100-129  mg/dL   Near or Above                    Optimal  130-159  mg/dL    Borderline  160-189  mg/dL   High  >190     mg/dL   Very High Performed at [facility] [address], [place], [place] [address]    HDL Cholesterol  Date Value Ref Range Status  10/07/2012 25 (L) 40 - 60 mg/dL Final   HDL  Date Value Ref Range Status  04/07/2017 31 (L) >40 mg/dL Final   Triglycerides  Date Value Ref Range Status  04/07/2017 191 (H) <150 mg/dL Final  10/07/2012 273 (H) 0 - 200 mg/dL Final         Failed - CBC within normal limits and completed in the last 12 months    WBC  Date Value Ref Range Status  08/18/2017 9.5 4.0 - 10.5 K/uL Final   RBC  Date Value Ref Range Status  08/18/2017 4.94 3.87 - 5.11 MIL/uL Final   Hemoglobin  Date Value Ref Range Status  08/18/2017 15.3 (H) 12.0 - 15.0 g/dL Final   HGB  Date Value Ref Range Status  09/22/2013 14.6 12.0 - 16.0 g/dL Final   HCT  Date Value Ref Range Status  08/18/2017 45.0 36.0 - 46.0 % Final  09/22/2013 44.3 35.0 - 47.0 % Final   MCHC  Date Value Ref Range Status  08/18/2017 31.9 30.0 - 36.0 g/dL Final   [facility]  Date Value Ref Range Status  08/18/2017 28.5 26.0 - 34.0 pg Final   MCV  Date Value Ref Range Status  08/18/2017 89.5 78.0 - 100.0 fL  Final  09/22/2013 90 80 - 100 fL Final   No results found for: "PLTCOUNTKUC", "LABPLAT", "POCPLA" RDW  Date Value Ref Range Status  08/18/2017 13.8 11.5 - 15.5 % Final  09/22/2013 13.8 11.5 - 14.5 % Final         Failed - CMP within normal limits and completed in the last 12 months    Albumin  Date Value Ref Range Status  08/18/2017 3.0 (L) 3.5 - 5.0 g/dL Final  09/22/2013 2.6 (L) 3.4 - 5.0 g/dL Final   Alkaline Phosphatase  Date Value Ref Range Status  08/18/2017 59 38 - 126 U/L Final  09/22/2013 78 Unit/L Final    Comment:    46-116 NOTE: New Reference Range 08/02/13    ALT  Date Value Ref Range Status  08/18/2017 16 0 - 44 U/L Final   SGPT (ALT)  Date Value Ref Range Status  09/22/2013 16 U/L Final    Comment:     14-63 NOTE: New Reference Range 08/02/13    AST  Date Value Ref Range Status  08/18/2017 22 15 - 41 U/L Final   SGOT(AST)  Date Value Ref Range Status  09/22/2013 17 15 - 37 Unit/L Final   BUN  Date Value Ref Range Status  05/15/2021 13 7 - 25 mg/dL Final  09/22/2013 11 7 - 18 mg/dL Final   Calcium  Date Value Ref Range Status  05/15/2021 8.6 8.6 - 10.4 mg/dL Final   Calcium, Total  Date Value Ref Range Status  09/22/2013 8.3 (L) 8.5 - 10.1 mg/dL Final   Calcium, Ion  Date Value Ref Range Status  04/07/2017 1.17 1.15 - 1.40 mmol/L Final   CO2  Date Value Ref Range Status  05/15/2021 29 20 - 32 mmol/L Final   Co2  Date Value Ref Range Status  09/22/2013 27 21 - 32 mmol/L Final   TCO2  Date Value Ref Range Status  04/07/2017 29 22 - 32 mmol/L Final   Creat  Date Value Ref Range Status  05/15/2021 0.62 0.50 - 1.03 mg/dL Final   Glucose  Date Value Ref Range Status  09/22/2013 335 (H) 65 - 99 mg/dL Final   Glucose, Bld  Date Value Ref Range Status  05/15/2021 261 (H) 65 - 139 mg/dL Final    Comment:    .        Non-fasting reference interval .    Glucose-Capillary  Date Value Ref Range Status  08/18/2017 120 (H) 70 - 99 mg/dL Final   Potassium  Date Value Ref Range Status  05/15/2021 4.9 3.5 - 5.3 mmol/L Final  09/22/2013 3.8 3.5 - 5.1 mmol/L Final   Sodium  Date Value Ref Range Status  05/15/2021 139 135 - 146 mmol/L Final  09/22/2013 135 (L) 136 - 145 mmol/L Final   Total Bilirubin  Date Value Ref Range Status  08/18/2017 0.9 0.3 - 1.2 mg/dL Final   Bilirubin,Total  Date Value Ref Range Status  09/22/2013 0.2 0.2 - 1.0 mg/dL Final   Bilirubin, Direct  Date Value Ref Range Status  08/28/2012 < 0.1 0.00 - 0.20 mg/dL Final   Protein, ur  Date Value Ref Range Status  08/18/2017 NEGATIVE NEGATIVE mg/dL Final   Total Protein  Date Value Ref Range Status  08/18/2017 7.0 6.5 - 8.1 g/dL Final  09/22/2013 7.2 6.4 - 8.2 g/dL Final    EGFR (African American)  Date Value Ref Range Status  09/22/2013 >60  Final   GFR calc Af Wyvonnia Lora  Date Value Ref Range Status  08/18/2017 >60 >60 mL/min Final    Comment:    (NOTE) The eGFR has been calculated using the CKD EPI equation. This calculation has not been validated in all clinical situations. eGFR's persistently <60 mL/min signify possible Chronic Kidney Disease.    GFR  Date Value Ref Range Status  09/23/2017 91.12 >60.00 mL/min Final   eGFR  Date Value Ref Range Status  05/15/2021 104 > OR = 60 mL/min/1.75m2 Final    Comment:    The eGFR is based on the CKD-EPI 2021 equation. To calculate  the new eGFR from a previous Creatinine or Cystatin C result, go to https://www.kidney.org/professionals/ kdoqi/gfr%5Fcalculator    EGFR (Non-African Amer.)  Date Value Ref Range Status  09/22/2013 >60  Final    Comment:    eGFR values <59mL/min/1.73 m2 may be an indication of chronic kidney disease (CKD). Calculated eGFR is useful in patients with stable renal function. The eGFR calculation will not be reliable in acutely ill patients when serum creatinine is changing rapidly. It is not useful in  patients on dialysis. The eGFR calculation may not be applicable to patients at the low and high extremes of body sizes, pregnant women, and vegetarians.    GFR calc non Af Amer  Date Value Ref Range Status  08/18/2017 >60 >60 mL/min Final         Passed - Completed PHQ-2 or PHQ-9 in the last 360 days      Passed - Last BP in normal range    BP Readings from Last 1 Encounters:  07/24/21 (!) 92/59         Passed - Last Heart Rate in normal range    Pulse Readings from Last 1 Encounters:  07/24/21 86         Passed - Valid encounter within last 6 months    Recent Outpatient Visits          6 days ago Acute bacterial sinusitis   [facility] [place], Coralie Keens, NP   4 months ago Hoarseness of voice   [place], DO   5 months ago Type 2 diabetes mellitus with other specified complication, with long-term current use of insulin [facility])   [facility] Olin Hauser, DO   6 months ago Psychophysiological insomnia   [place], DO   7 months ago Umbilical hernia with obstruction, without gangrene   [facility], Devonne Doughty, DO             . pantoprazole (Harris) 40 MG tablet [Pharmacy Med Name: Pantoprazole Sodium 40 MG Oral Tablet Delayed Release] 90 tablet 0    Sig: TAKE 1 TABLET BY MOUTH DAILY     Gastroenterology: Proton Pump Inhibitors Passed - 10/16/2021  2:56 PM      Passed - Valid encounter within last 12 months    Recent Outpatient Visits          6 days ago Acute bacterial sinusitis   [facility] [place], Coralie Keens, NP   4 months ago Hoarseness of voice   [place], DO   5 months ago Type 2 diabetes mellitus with other specified complication, with long-term current use of insulin [facility])   [facility] Olin Haus[facility], DO   6 months ago Psychophysiological insomnia   [facility] [place], [place]ard Coil  J, DO   7 months ago Umbilical hernia with obstruction, without gangrene   [place], DO             . clopidogrel (PLAVIX) 75 MG tablet [Pharmacy Med Name: Clopidogrel Bisulfate 75 MG Oral Tablet] 100 tablet 0    Sig: TAKE 1 TABLET BY MOUTH DAILY     Hematology: Antiplatelets - clopidogrel Failed - 10/16/2021  2:56 PM      Failed - HCT in normal range and within 180 days    HCT  Date Value Ref Range Status  08/18/2017 45.0 36.0 - 46.0 % Final  09/22/2013 44.3 35.0 - 47.0 % Final         Failed - HGB in normal range and within 180 days    Hemoglobin  Date Value Ref Range Status  08/18/2017 15.3 (H) 12.0 - 15.0 g/dL Final   HGB   Date Value Ref Range Status  09/22/2013 14.6 12.0 - 16.0 g/dL Final         Failed - PLT in normal range and within 180 days    Platelets  Date Value Ref Range Status  08/18/2017 316 150 - 400 K/uL Final   Platelet  Date Value Ref Range Status  09/22/2013 300 150 - 440 x10 3/mm 3 Final         Passed - Cr in normal range and within 360 days    Creat  Date Value Ref Range Status  05/15/2021 0.62 0.50 - 1.03 mg/dL Final         Passed - Valid encounter within last 6 months    Recent Outpatient Visits          6 days ago Acute bacterial sinusitis   [facility] [place], Coralie Keens, NP   4 months ago Hoarseness of voice   [facility] Olin Hauser, DO   5 months ago Type 2 diabetes mellitus with other specified complication, with long-term current use of insulin ([place])   [facility] [place], Devonne Doughty, DO   6 months ago Psychophysiological insomnia   [place], Devonne Doughty, DO   7 months ago Umbilical hernia with obstruction, without gangrene   [facility], Devonne Doughty, DO             . topiramate (TOPAMAX) 100 MG tablet [Pharmacy Med Name: Topiramate 100 MG Oral Tablet] 180 tablet 0    Sig: TAKE 1 TABLET BY MOUTH TWICE  DAILY     Neurology: Anticonvulsants - topiramate & zonisamide Failed - 10/16/2021  2:56 PM      Failed - ALT in normal range and within 360 days    ALT  Date Value Ref Range Status  08/18/2017 16 0 - 44 U/L Final   SGPT (ALT)  Date Value Ref Range Status  09/22/2013 16 U/L Final    Comment:    14-63 NOTE: New Reference Range 08/02/13          Failed - AST in normal range and within 360 days    AST  Date Value Ref Range Status  08/18/2017 22 15 - 41 U/L Final   SGOT(AST)  Date Value Ref Range Status  09/22/2013 17 15 - 37 Unit/L Final         Passed - Cr in normal range and within 360 days    Creat  Date Value Ref  Range Status  05/15/2021  0.62 0.50 - 1.03 mg/dL Final         Passed - CO2 in normal range and within 360 days    CO2  Date Value Ref Range Status  05/15/2021 29 20 - 32 mmol/L Final   Co2  Date Value Ref Range Status  09/22/2013 27 21 - 32 mmol/L Final         Passed - Completed PHQ-2 or PHQ-9 in the last 360 days      Passed - Valid encounter within last 12 months    Recent Outpatient Visits          6 days ago Acute bacterial sinusitis   [facility] [place], Coralie Keens, NP   4 months ago Hoarseness of voice   [facility] [place], Devonne Doughty, DO   5 months ago Type 2 diabetes mellitus with other specified complication, with long-term current use of insulin ([place])   [facility] [place], Devonne Doughty, DO   6 months ago Psychophysiological insomnia   [place], DO   7 months ago Umbilical hernia with obstruction, without gangrene   [place], DO             . buPROPion (WELLBUTRIN XL) 150 MG 24 hr tablet [Pharmacy Med Name: buPROPion HCl ER (XL) 150 MG Oral Tablet Extended Release 24 Hour] 90 tablet 0    Sig: TAKE 1 TABLET BY MOUTH DAILY     Psychiatry: Antidepressants - bupropion Failed - 10/16/2021  2:56 PM      Failed - AST in normal range and within 360 days    AST  Date Value Ref Range Status  08/18/2017 22 15 - 41 U/L Final   SGOT(AST)  Date Value Ref Range Status  09/22/2013 17 15 - 37 Unit/L Final         Failed - ALT in normal range and within 360 days    ALT  Date Value Ref Range Status  08/18/2017 16 0 - 44 U/L Final   SGPT (ALT)  Date Value Ref Range Status  09/22/2013 16 U/L Final    Comment:    14-63 NOTE: New Reference Range 08/02/13          Passed - Cr in normal range and within 360 days    Creat  Date Value Ref Range Status  05/15/2021 0.62 0.50 - 1.03 mg/dL Final         Passed - Completed PHQ-2  or PHQ-9 in the last 360 days      Passed - Last BP in normal range    BP Readings from Last 1 Encounters:  07/24/21 (!) 92/59         Passed - Valid encounter within last 6 months    Recent Outpatient Visits          6 days ago Acute bacterial sinusitis   [facility] [place], Coralie Keens, NP   4 months ago Hoarseness of voice   [facility] Olin Hauser, DO   5 months ago Type 2 diabetes mellitus with other specified complication, with long-term current use of insulin [facility])   [place], DO   6 months ago Psychophysiological insomnia   [place], DO   7 months ago Umbilical hernia with obstruction, without gangrene   [place], DO             .  fluticasone (FLONASE) 50 MCG/ACT nasal spray [Pharmacy Med Name: Fluticasone Propionate 50 MCG/ACT Nasal Suspension] 48 g 0    Sig: USE 2 SPRAYS IN BOTH NOSTRILS  DAILY USE FOR 4 TO 6 WEEKS, THEN STOP AND USE SEASONALLY OR AS  NEEDED     Ear, Nose, and Throat: Nasal Preparations - Corticosteroids Passed - 10/16/2021  2:56 PM      Passed - Valid encounter within last 12 months    Recent Outpatient Visits          6 days ago Acute bacterial sinusitis   [facility] [place], Coralie Keens, NP   4 months ago Hoarseness of voice   [place], DO   5 months ago Type 2 diabetes mellitus with other specified complication, with long-term current use of insulin [facility])   [place], DO   6 months ago Psychophysiological insomnia   [place], DO   7 months ago Umbilical hernia with obstruction, without gangrene   [place], Devonne Doughty, DO

## 2021-10-16 NOTE — Telephone Encounter (Signed)
Unable to refill per protocol, rx request too soon. Last RF for Lipitor 11/28/20 for 90 and 3 RF. Seroquel last RF was 07/10/21 for 90 and 1 RF. Will refuse request.   Requested Prescriptions  Pending Prescriptions Disp Refills  . atorvastatin (LIPITOR) 40 MG tablet [Pharmacy Med Name: Atorvastatin Calcium 40 MG Oral Tablet] 90 tablet 3    Sig: TAKE 1 TABLET BY MOUTH AT  BEDTIME     Cardiovascular:  Antilipid - Statins Failed - 10/16/2021  2:56 PM      Failed - Lipid Panel in normal range within the last 12 months    Cholesterol  Date Value Ref Range Status  04/07/2017 136 0 - 200 mg/dL Final  10/07/2012 207 (H) 0 - 200 mg/dL Final   Ldl Cholesterol, Calc  Date Value Ref Range Status  10/07/2012 127 (H) 0 - 100 mg/dL Final   LDL Cholesterol  Date Value Ref Range Status  04/07/2017 67 0 - 99 mg/dL Final    Comment:           Total Cholesterol/HDL:CHD Risk Coronary Heart Disease Risk Table                     Men   Women  1/2 Average Risk   3.4   3.3  Average Risk       5.0   4.4  2 X Average Risk   9.6   7.1  3 X Average Risk  23.4   11.0        Use the calculated Patient Ratio above and the CHD Risk Table to determine the patient's CHD Risk.        ATP III CLASSIFICATION (LDL):  <100     mg/dL   Optimal  100-129  mg/dL   Near or Above                    Optimal  130-159  mg/dL   Borderline  160-189  mg/dL   High  >190     mg/dL   Very High Performed at [facility] [address], [place], [place] [address]    HDL Cholesterol  Date Value Ref Range Status  10/07/2012 25 (L) 40 - 60 mg/dL Final   HDL  Date Value Ref Range Status  04/07/2017 31 (L) >40 mg/dL Final   Triglycerides  Date Value Ref Range Status  04/07/2017 191 (H) <150 mg/dL Final  10/07/2012 273 (H) 0 - 200 mg/dL Final         Passed - Patient is not pregnant      Passed - Valid encounter within last 12 months    Recent Outpatient Visits          6 days ago Acute bacterial sinusitis    [facility], Coralie Keens, NP   4 months ago Hoarseness of voice   [facility] [place], Devonne Doughty, DO   5 months ago Type 2 diabetes mellitus with other specified complication, with long-term current use of insulin [facility])   [facility] Olin Hauser, DO   6 months ago Psychophysiological insomnia   [place], DO   7 months ago Umbilical hernia with obstruction, without gangrene   [place], DO             . QUEtiapine (SEROQUEL) 100 MG tablet [Pharmacy Med Name: QUEtiapine Fumarate  100 MG Oral Tablet] 90 tablet 3    Sig: TAKE 1 TABLET BY MOUTH AT  BEDTIME     Not Delegated - Psychiatry:  Antipsychotics - Second Generation (Atypical) - quetiapine Failed - 10/16/2021  2:56 PM      Failed - This refill cannot be delegated      Failed - TSH in normal range and within 360 days    TSH  Date Value Ref Range Status  08/09/2017 1.086 0.350 - 4.500 uIU/mL Final    Comment:    Performed by a 3rd Generation assay with a functional sensitivity of <=0.01 uIU/mL. Performed at [facility], [address] [place], [place] [address]          Failed - Lipid Panel in normal range within the last 12 months    Cholesterol  Date Value Ref Range Status  04/07/2017 136 0 - 200 mg/dL Final  10/07/2012 207 (H) 0 - 200 mg/dL Final   Ldl Cholesterol, Calc  Date Value Ref Range Status  10/07/2012 127 (H) 0 - 100 mg/dL Final   LDL Cholesterol  Date Value Ref Range Status  04/07/2017 67 0 - 99 mg/dL Final    Comment:           Total Cholesterol/HDL:CHD Risk Coronary Heart Disease Risk Table                     Men   Women  1/2 Average Risk   3.4   3.3  Average Risk       5.0   4.4  2 X Average Risk   9.6   7.1  3 X Average Risk  23.4   11.0        Use the calculated Patient Ratio above and the CHD Risk Table to determine the  patient's CHD Risk.        ATP III CLASSIFICATION (LDL):  <100     mg/dL   Optimal  100-129  mg/dL   Near or Above                    Optimal  130-159  mg/dL   Borderline  160-189  mg/dL   High  >190     mg/dL   Very High Performed at [facility] [address] [place], [place] [address]    HDL Cholesterol  Date Value Ref Range Status  10/07/2012 25 (L) 40 - 60 mg/dL Final   HDL  Date Value Ref Range Status  04/07/2017 31 (L) >40 mg/dL Final   Triglycerides  Date Value Ref Range Status  04/07/2017 191 (H) <150 mg/dL Final  10/07/2012 273 (H) 0 - 200 mg/dL Final         Failed - CBC within normal limits and completed in the last 12 months    WBC  Date Value Ref Range Status  08/18/2017 9.5 4.0 - 10.5 K/uL Final   RBC  Date Value Ref Range Status  08/18/2017 4.94 3.87 - 5.11 MIL/uL Final   Hemoglobin  Date Value Ref Range Status  08/18/2017 15.3 (H) 12.0 - 15.0 g/dL Final   HGB  Date Value Ref Range Status  09/22/2013 14.6 12.0 - 16.0 g/dL Final   HCT  Date Value Ref Range Status  08/18/2017 45.0 36.0 - 46.0 % Final  09/22/2013 44.3 35.0 - 47.0 % Final   MCHC  Date Value Ref Range Status  08/18/2017 31.9 30.0 - 36.0 g/dL  Final   MCH  Date Value Ref Range Status  08/18/2017 28.5 26.0 - 34.0 pg Final   MCV  Date Value Ref Range Status  08/18/2017 89.5 78.0 - 100.0 fL Final  09/22/2013 90 80 - 100 fL Final   No results found for: "PLTCOUNTKUC", "LABPLAT", "POCPLA" RDW  Date Value Ref Range Status  08/18/2017 13.8 11.5 - 15.5 % Final  09/22/2013 13.8 11.5 - 14.5 % Final         Failed - CMP within normal limits and completed in the last 12 months    Albumin  Date Value Ref Range Status  08/18/2017 3.0 (L) 3.5 - 5.0 g/dL Final  09/22/2013 2.6 (L) 3.4 - 5.0 g/dL Final   Alkaline Phosphatase  Date Value Ref Range Status  08/18/2017 59 38 - 126 U/L Final  09/22/2013 78 Unit/L Final    Comment:    46-116 NOTE: New Reference  Range 08/02/13    ALT  Date Value Ref Range Status  08/18/2017 16 0 - 44 U/L Final   SGPT (ALT)  Date Value Ref Range Status  09/22/2013 16 U/L Final    Comment:    14-63 NOTE: New Reference Range 08/02/13    AST  Date Value Ref Range Status  08/18/2017 22 15 - 41 U/L Final   SGOT(AST)  Date Value Ref Range Status  09/22/2013 17 15 - 37 Unit/L Final   BUN  Date Value Ref Range Status  05/15/2021 13 7 - 25 mg/dL Final  09/22/2013 11 7 - 18 mg/dL Final   Calcium  Date Value Ref Range Status  05/15/2021 8.6 8.6 - 10.4 mg/dL Final   Calcium, Total  Date Value Ref Range Status  09/22/2013 8.3 (L) 8.5 - 10.1 mg/dL Final   Calcium, Ion  Date Value Ref Range Status  04/07/2017 1.17 1.15 - 1.40 mmol/L Final   CO2  Date Value Ref Range Status  05/15/2021 29 20 - 32 mmol/L Final   Co2  Date Value Ref Range Status  09/22/2013 27 21 - 32 mmol/L Final   TCO2  Date Value Ref Range Status  04/07/2017 29 22 - 32 mmol/L Final   Creat  Date Value Ref Range Status  05/15/2021 0.62 0.50 - 1.03 mg/dL Final   Glucose  Date Value Ref Range Status  09/22/2013 335 (H) 65 - 99 mg/dL Final   Glucose, Bld  Date Value Ref Range Status  05/15/2021 261 (H) 65 - 139 mg/dL Final    Comment:    .        Non-fasting reference interval .    Glucose-Capillary  Date Value Ref Range Status  08/18/2017 120 (H) 70 - 99 mg/dL Final   Potassium  Date Value Ref Range Status  05/15/2021 4.9 3.5 - 5.3 mmol/L Final  09/22/2013 3.8 3.5 - 5.1 mmol/L Final   Sodium  Date Value Ref Range Status  05/15/2021 139 135 - 146 mmol/L Final  09/22/2013 135 (L) 136 - 145 mmol/L Final   Total Bilirubin  Date Value Ref Range Status  08/18/2017 0.9 0.3 - 1.2 mg/dL Final   Bilirubin,Total  Date Value Ref Range Status  09/22/2013 0.2 0.2 - 1.0 mg/dL Final   Bilirubin, Direct  Date Value Ref Range Status  08/28/2012 < 0.1 0.00 - 0.20 mg/dL Final   Protein, ur  Date Value Ref Range  Status  08/18/2017 NEGATIVE NEGATIVE mg/dL Final   Total Protein  Date Value Ref Range Status  08/18/2017 7.0 6.5 -  8.1 g/dL Final  09/22/2013 7.2 6.4 - 8.2 g/dL Final   EGFR (African American)  Date Value Ref Range Status  09/22/2013 >60  Final   GFR calc Af Amer  Date Value Ref Range Status  08/18/2017 >60 >60 mL/min Final    Comment:    (NOTE) The eGFR has been calculated using the CKD EPI equation. This calculation has not been validated in all clinical situations. eGFR's persistently <60 mL/min signify possible Chronic Kidney Disease.    GFR  Date Value Ref Range Status  09/23/2017 91.12 >60.00 mL/min Final   eGFR  Date Value Ref Range Status  05/15/2021 104 > OR = 60 mL/min/1.10m2 Final    Comment:    The eGFR is based on the CKD-EPI 2021 equation. To calculate  the new eGFR from a previous Creatinine or Cystatin C result, go to https://www.kidney.org/professionals/ kdoqi/gfr%5Fcalculator    EGFR (Non-African Amer.)  Date Value Ref Range Status  09/22/2013 >60  Final    Comment:    eGFR values <21mL/min/1.73 m2 may be an indication of chronic kidney disease (CKD). Calculated eGFR is useful in patients with stable renal function. The eGFR calculation will not be reliable in acutely ill patients when serum creatinine is changing rapidly. It is not useful in  patients on dialysis. The eGFR calculation may not be applicable to patients at the low and high extremes of body sizes, pregnant women, and vegetarians.    GFR calc non Af Amer  Date Value Ref Range Status  08/18/2017 >60 >60 mL/min Final         Passed - Completed PHQ-2 or PHQ-9 in the last 360 days      Passed - Last BP in normal range    BP Readings from Last 1 Encounters:  07/24/21 (!) 92/59         Passed - Last Heart Rate in normal range    Pulse Readings from Last 1 Encounters:  07/24/21 86         Passed - Valid encounter within last 6 months    Recent Outpatient Visits           6 days ago Acute bacterial sinusitis   [facility] [place], Coralie Keens, NP   4 months ago Hoarseness of voice   [place], DO   5 months ago Type 2 diabetes mellitus with other specified complication, with long-term current use of insulin [facility])   [facility] Olin Hauser, DO   6 months ago Psychophysiological insomnia   [place], DO   7 months ago Umbilical hernia with obstruction, without gangrene   [place], DO             Signed Prescriptions Disp Refills   pantoprazole (PROTONIX) 40 MG tablet 90 tablet 0    Sig: TAKE 1 TABLET BY MOUTH DAILY     Gastroenterology: Proton Pump Inhibitors Passed - 10/16/2021  2:56 PM      Passed - Valid encounter within last 12 months    Recent Outpatient Visits          6 days ago Acute bacterial sinusitis   [facility] [place], Coralie Keens, NP   4 months ago Hoarseness of voice   [place], DO   5 months ago Type 2 diabetes mellitus with other specified complication, with long-term current use  of insulin (HCC)   [facility] [place], Netta Neat, DO   6 months ago Psychophysiological insomnia   [facility] [place], Netta Neat, DO   7 months ago Umbilical hernia with obstruction, without gangrene   [facility] [place], Netta Neat, DO              clopidogrel (PLAVIX) 75 MG tablet 100 tablet 0    Sig: TAKE 1 TABLET BY MOUTH DAILY     Hematology: Antiplatelets - clopidogrel Failed - 10/16/2021  2:56 PM      Failed - HCT in normal range and within 180 days    HCT  Date Value Ref Range Status  08/18/2017 45.0 36.0 - 46.0 % Final  09/22/2013 44.3 35.0 - 47.0 % Final         Failed - HGB in normal range and within 180 days    Hemoglobin  Date Value Ref  Range Status  08/18/2017 15.3 (H) 12.0 - 15.0 g/dL Final   HGB  Date Value Ref Range Status  09/22/2013 14.6 12.0 - 16.0 g/dL Final         Failed - PLT in normal range and within 180 days    Platelets  Date Value Ref Range Status  08/18/2017 316 150 - 400 K/uL Final   Platelet  Date Value Ref Range Status  09/22/2013 300 150 - 440 x10 3/mm 3 Final         Passed - Cr in normal range and within 360 days    Creat  Date Value Ref Range Status  05/15/2021 0.62 0.50 - 1.03 mg/dL Final         Passed - Valid encounter within last 6 months    Recent Outpatient Visits          6 days ago Acute bacterial sinusitis   [facility] [place], Salvadore [place], NP   4 months ago Hoarseness of voice   [facility] Smitty Cords, DO   5 months ago Type 2 diabetes mellitus with other specified complication, with long-term current use of insulin (HCC)   [facility] Smitty Cords, DO   6 months ago Psychophysiological insomnia   [facility] [place], Netta Neat, DO   7 months ago Umbilical hernia with obstruction, without gangrene   [facility], Netta Neat, DO              topiramate (TOPAMAX) 100 MG tablet 180 tablet 0    Sig: TAKE 1 TABLET BY MOUTH TWICE  DAILY     Neurology: Anticonvulsants - topiramate & zonisamide Failed - 10/16/2021  2:56 PM      Failed - ALT in normal range and within 360 days    ALT  Date Value Ref Range Status  08/18/2017 16 0 - 44 U/L Final   SGPT (ALT)  Date Value Ref Range Status  09/22/2013 16 U/L Final    Comment:    14-63 NOTE: New Reference Range 08/02/13          Failed - AST in normal range and within 360 days    AST  Date Value Ref Range Status  08/18/2017 22 15 - 41 U/L Final   SGOT(AST)  Date Value Ref Range Status  09/22/2013 17 15 - 37 Unit/L Final         Passed - Cr in normal range and within 360 days  Creat  Date  Value Ref Range Status  05/15/2021 0.62 0.50 - 1.03 mg/dL Final         Passed - CO2 in normal range and within 360 days    CO2  Date Value Ref Range Status  05/15/2021 29 20 - 32 mmol/L Final   Co2  Date Value Ref Range Status  09/22/2013 27 21 - 32 mmol/L Final         Passed - Completed PHQ-2 or PHQ-9 in the last 360 days      Passed - Valid encounter within last 12 months    Recent Outpatient Visits          6 days ago Acute bacterial sinusitis   [facility] [place], Coralie Keens, NP   4 months ago Hoarseness of voice   [facility] [place], Devonne Doughty, DO   5 months ago Type 2 diabetes mellitus with other specified complication, with long-term current use of insulin ([place])   [facility] [place], Devonne Doughty, DO   6 months ago Psychophysiological insomnia   [place], DO   7 months ago Umbilical hernia with obstruction, without gangrene   [place], DO              buPROPion (WELLBUTRIN XL) 150 MG 24 hr tablet 90 tablet 0    Sig: TAKE 1 TABLET BY MOUTH DAILY     Psychiatry: Antidepressants - bupropion Failed - 10/16/2021  2:56 PM      Failed - AST in normal range and within 360 days    AST  Date Value Ref Range Status  08/18/2017 22 15 - 41 U/L Final   SGOT(AST)  Date Value Ref Range Status  09/22/2013 17 15 - 37 Unit/L Final         Failed - ALT in normal range and within 360 days    ALT  Date Value Ref Range Status  08/18/2017 16 0 - 44 U/L Final   SGPT (ALT)  Date Value Ref Range Status  09/22/2013 16 U/L Final    Comment:    14-63 NOTE: New Reference Range 08/02/13          Passed - Cr in normal range and within 360 days    Creat  Date Value Ref Range Status  05/15/2021 0.62 0.50 - 1.03 mg/dL Final         Passed - Completed PHQ-2 or PHQ-9 in the last 360 days      Passed - Last BP in normal range    BP  Readings from Last 1 Encounters:  07/24/21 (!) 92/59         Passed - Valid encounter within last 6 months    Recent Outpatient Visits          6 days ago Acute bacterial sinusitis   [facility] [place], Coralie Keens, NP   4 months ago Hoarseness of voice   [facility] Olin Hauser, DO   5 months ago Type 2 diabetes mellitus with other specified complication, with long-term current use of insulin [facility])   [place], DO   6 months ago Psychophysiological insomnia   [place], DO   7 months ago Umbilical hernia with obstruction, without gangrene   [place], Devonne Doughty, DO  fluticasone (FLONASE) 50 MCG/ACT nasal spray 48 g 0    Sig: USE 2 SPRAYS IN BOTH NOSTRILS  DAILY USE FOR 4 TO 6 WEEKS, THEN STOP AND USE SEASONALLY OR AS  NEEDED     Ear, Nose, and Throat: Nasal Preparations - Corticosteroids Passed - 10/16/2021  2:56 PM      Passed - Valid encounter within last 12 months    Recent Outpatient Visits          6 days ago Acute bacterial sinusitis   [facility] [place], Coralie Keens, NP   4 months ago Hoarseness of voice   [place], DO   5 months ago Type 2 diabetes mellitus with other specified complication, with long-term current use of insulin [facility])   [place], DO   6 months ago Psychophysiological insomnia   [place], DO   7 months ago Umbilical hernia with obstruction, without gangrene   [place], DO

## 2021-10-17 ENCOUNTER — Ambulatory Visit: Payer: Self-pay

## 2021-10-17 NOTE — Telephone Encounter (Signed)
  Chief Complaint: dizziness Symptoms: hears water in ears when swallowing Frequency: 2 weeks ago Pertinent Negatives: Patient denies  fever, chest pain, vomiting, diarrhea, bleeding Disposition: '[]'$ ED /'[]'$ Urgent Care (no appt availability in office) / '[]'$ Appointment(In office/virtual)/ '[]'$  [place] [facility]/ '[]'$ Home Care/ '[]'$ Refused Recommended Disposition /'[]'$ [facility]/ '[x]'$  Follow-up with PCP Additional Notes: per last OV , pt advised to come into office is sx persist. Pt stated the abx, meclizine and steroids did not improve sx. Advised pt will send note to office to see if can work pt in.  Reason for Disposition  [1] MODERATE dizziness (e.g., interferes with normal activities) AND [2] has been evaluated by doctor (or NP/PA) for this  Answer Assessment - Initial Assessment Questions 1. DESCRIPTION: "Describe your dizziness."     Feels faint in certain positions 2. LIGHTHEADED: "Do you feel lightheaded?" (e.g., somewhat faint, woozy, weak upon standing)     faint 3. VERTIGO: "Do you feel like either you or the room is spinning or tilting?" (i.e. vertigo)     no 4. SEVERITY: "How bad is it?"  "Do you feel like you are going to faint?" "Can you stand and walk?"   - MILD: Feels slightly dizzy, but walking normally.   - MODERATE: Feels unsteady when walking, but not falling; interferes with normal activities (e.g., school, work).   - SEVERE: Unable to walk without falling, or requires assistance to walk without falling; feels like passing out now.      W/C 5. ONSET:  "When did the dizziness begin?"     2 weeks 6. AGGRAVATING FACTORS: "Does anything make it worse?" (e.g., standing, change in head position)     Laying down , moving head side to side 7. HEART RATE: "Can you tell me your heart rate?" "How many beats in 15 seconds?"  (Note: not all patients can do this)       84 8. CAUSE: "What do you think is causing the dizziness?"     Ear feels like sloshing or when swallowing  feels like can hear water in ears 9. RECURRENT SYMPTOM: "Have you had dizziness before?" If Yes, ask: "When was the last time?" "What happened that time?"     Vertigo-  10. OTHER SYMPTOMS: "Do you have any other symptoms?" (e.g., fever, chest pain, vomiting, diarrhea, bleeding)       ndee 11. PREGNANCY: "Is there any chance you are pregnant?" "When was your last menstrual period?"       N/a  Protocols used: Dizziness - Lightheadedness-A-AH

## 2021-10-17 NOTE — Telephone Encounter (Signed)
Please schedule her for acute visit with me in person tomorrow Friday [place], [employer] 10/17/2021, 2:06 PM

## 2021-10-17 NOTE — Telephone Encounter (Signed)
LMTCB 10/17/2021.  PEC please schedule pt when she calls back.  (She will need to be seen in person)    Thanks,   -Mickel Baas

## 2021-10-18 ENCOUNTER — Ambulatory Visit (INDEPENDENT_AMBULATORY_CARE_PROVIDER_SITE_OTHER): Payer: Medicare Other | Admitting: Family Medicine

## 2021-10-18 ENCOUNTER — Encounter: Payer: Self-pay | Admitting: Family Medicine

## 2021-10-18 ENCOUNTER — Ambulatory Visit: Payer: Medicare Other | Admitting: Family Medicine

## 2021-10-18 VITALS — BP 119/77 | HR 89 | Ht 66.0 in | Wt 233.0 lb

## 2021-10-18 DIAGNOSIS — H6993 Unspecified Eustachian tube disorder, bilateral: Secondary | ICD-10-CM

## 2021-10-18 DIAGNOSIS — Z23 Encounter for immunization: Secondary | ICD-10-CM | POA: Diagnosis not present

## 2021-10-18 DIAGNOSIS — R112 Nausea with vomiting, unspecified: Secondary | ICD-10-CM | POA: Diagnosis not present

## 2021-10-18 DIAGNOSIS — J0110 Acute frontal sinusitis, unspecified: Secondary | ICD-10-CM

## 2021-10-18 MED ORDER — LEVOFLOXACIN 500 MG PO TABS: 500.0000 mg | ORAL_TABLET | Freq: Every day | ORAL | 0 refills | Status: DC

## 2021-10-18 MED ORDER — ONDANSETRON 4 MG PO TBDP: 4.0000 mg | ORAL_TABLET | Freq: Three times a day (TID) | ORAL | 2 refills | Status: DC | PRN

## 2021-10-18 NOTE — Progress Notes (Signed)
Subjective:    Patient ID: [name], female    DOB: 16-Oct-1965, 56 y.o.   MRN: [id number]  [name] is a 56 y.o. female presenting on 10/18/2021 for Dizziness and Sinus Problem   HPI  Sinusitis Vertigo Last visit 10/10/21 virtual treated for sinusitis with Zpak and Meclizine for vertigo Admits lightheadedness, dizziness, vertigo with room spinning. She describes symptoms worse with change of position with getting up or laying down triggering same with both Left and Right triggering her dizziness and vertigo. She was started on Meclizine and it does not slow it down much. Persistent sinus congestion pressure pain and yellow drainage. Not in chest, she is breathing well currently. In past required other antibiotics to resolve similar Has had history of vertigo before - Denies fever chills dyspnea numbness tingling weakness   Health Maintenance: Flu Shot today     05/15/2021    1:59 PM 04/17/2021    2:35 PM 03/04/2021    2:30 PM  Depression screen PHQ 2/9  Decreased Interest _0 Down, Depressed, Hopeless 0 0 1  PHQ - 2 Score _1 Altered sleeping _2 Tired, decreased energy _3 Change in appetite _4 Feeling bad or failure about yourself  0 0 0  Trouble concentrating _5 Moving slowly or fidgety/restless 2 2 0  Suicidal thoughts 0 0 0  PHQ-9 Score _6 Difficult doing work/chores Very difficult Very difficult Not difficult at all    Social History   Tobacco Use   Smoking status: Every Day    Packs/day: 1.00    Years: 20.00    Total pack years: 20.00    Types: Cigarettes   Smokeless tobacco: Never  Vaping Use   Vaping Use: Former  Substance Use Topics   Alcohol use: No   Drug use: Yes    Types: Marijuana    Comment: last smoked 2 days ago  8/4    Review of Systems Per HPI unless specifically indicated above     Objective:    BP 119/77   Pulse 89   Ht _7  (1.676 m)   Wt 233 lb (105.7 kg)   SpO2 95%   BMI 37.61 kg/m    Wt Readings from Last 3 Encounters:  10/18/21 233 lb (105.7 kg)  07/24/21 233 lb (105.7 kg)  05/28/21 233 lb (105.7 kg)    Physical Exam Vitals and nursing note reviewed.  Constitutional:      General: She is not in acute distress.    Appearance: Normal appearance. She is well-developed. She is obese. She is not diaphoretic.     Comments: Well-appearing, comfortable, cooperative  HENT:     Head: Normocephalic and atraumatic.     Right Ear: There is no impacted cerumen.     Left Ear: There is no impacted cerumen.     Ears:     Comments: R>L TM effusion opaque, no erythema, fullness w some bulging    Nose: Congestion present.  Eyes:     General:        Right eye: No discharge.        Left eye: No discharge.     Conjunctiva/sclera: Conjunctivae normal.  Cardiovascular:     Rate and Rhythm: Normal rate.  Pulmonary:     Effort: Pulmonary effort is normal.  Musculoskeletal:     Comments: Wheelchair bound  Skin:  General: Skin is warm and dry.     Findings: No erythema or rash.  Neurological:     Mental Status: She is alert and oriented to person, place, and time.  Psychiatric:        Mood and Affect: Mood normal.        Behavior: Behavior normal.        Thought Content: Thought content normal.     Comments: Well groomed, good eye contact, normal speech and thoughts    Results for orders placed or performed in visit on 05/15/21  Hemoglobin A1c  Result Value Ref Range   Hgb A1c MFr Bld 9.7 (H) <5.7 % of total Hgb   Mean Plasma Glucose 232 mg/dL   eAG (mmol/L) 12.8 mmol/L  BASIC METABOLIC PANEL WITH GFR  Result Value Ref Range   Glucose, Bld 261 (H) 65 - 139 mg/dL   BUN 13 7 - 25 mg/dL   Creat 0.62 0.50 - 1.03 mg/dL   eGFR 104 > OR = 60 mL/min/1.93m   BUN/Creatinine Ratio NOT APPLICABLE 6 - 22 (calc)   Sodium 139 135 - 146 mmol/L   Potassium 4.9 3.5 - 5.3 mmol/L   Chloride 102 98 - 110 mmol/L   CO2 29 20 - 32 mmol/L   Calcium 8.6 8.6 - 10.4 mg/dL       Assessment & Plan:   Problem List Items Addressed This Visit   None Visit Diagnoses     Acute non-recurrent frontal sinusitis    -  Primary   Relevant Medications   levofloxacin (LEVAQUIN) 500 MG tablet   Nausea and vomiting, unspecified vomiting type       Relevant Medications   ondansetron (ZOFRAN-ODT) 4 MG disintegrating tablet   Needs flu shot       Relevant Orders   Flu Vaccine QUAD 631moM (Fluarix, Fluzone & Alfiuria Quad PF) (Completed)   Eustachian tube dysfunction, bilateral           Sinusitis Secondary vertigo Eustachian tube dysfunction w/ effusion No evidence of lower respiratory infection Afebrile  Failed Zpak, Prednisone. Not wheezing or COPD flare ALlergy to Augmentin Start taking Levaquin antibiotic 50041maily x 7 days Start Flonase w/ eustachian tube dysfunction May use meclizine still Re order Zofran given nausea from vertigo [facility] and printed  Meds ordered this encounter  Medications   ondansetron (ZOFRAN-ODT) 4 MG disintegrating tablet    Sig: Take 1 tablet (4 mg total) by mouth every 8 (eight) hours as needed for nausea or vomiting.    Dispense:  30 tablet    Refill:  2   levofloxacin (LEVAQUIN) 500 MG tablet    Sig: Take 1 tablet (500 mg total) by mouth daily. For 7 days    Dispense:  7 tablet    Refill:  0      Follow up plan: Return if symptoms worsen or fail to improve.    AleNobie PutnamO [facility] 10/18/2021, 4:30 PM

## 2021-10-18 NOTE — Patient Instructions (Addendum)
Thank you for coming to the office today.  Start taking Levaquin antibiotic '500mg'$  daily x 7 days  Use Nasal spray, Flonase 2 sprays each nostril once a day for about 4+ weeks  Zofran as needed.  1. You have symptoms of Vertigo (Benign Paroxysmal Positional Vertigo) - This is commonly caused by inner ear fluid imbalance, sometimes can be worsened by allergies and sinus symptoms, otherwise it can occur randomly sometimes and we may never discover the exact cause. - To treat this, try the Epley Manuever (see diagrams/instructions below) at home up to 3 times a day for 1-2 weeks or until symptoms resolve - You may take Meclizine as needed up to 3 times a day for dizziness, this will not cure symptoms but may help. Caution may make you drowsy.  If you develop significant worsening episode with vertigo that does not improve and you get severe headache, loss of vision, arm or leg weakness, slurred speech, or other concerning symptoms please seek immediate medical attention at Emergency Department.  Please schedule a follow-up appointment with Dr Parks Ranger within 4 weeks if Vertigo not improving, and will consider Referral to Vestibular Rehab  See the next page for images describing the Epley Manuever.     ----------------------------------------------------------------------------------------------------------------------         Please schedule a Follow-up Appointment to: Return if symptoms worsen or fail to improve.  If you have any other questions or concerns, please feel free to call the office or send a message through [place]. You may also schedule an earlier appointment if necessary.  Additionally, you may be receiving a survey about your experience at our office within a few days to 1 week by e-mail or mail. We value your feedback.  Nobie Putnam, DO [place]

## 2021-10-25 ENCOUNTER — Telehealth: Payer: Self-pay

## 2021-10-25 ENCOUNTER — Encounter: Payer: Self-pay | Admitting: Family Medicine

## 2021-10-25 DIAGNOSIS — I739 Peripheral vascular disease, unspecified: Secondary | ICD-10-CM

## 2021-10-25 DIAGNOSIS — B379 Candidiasis, unspecified: Secondary | ICD-10-CM

## 2021-10-25 DIAGNOSIS — J0110 Acute frontal sinusitis, unspecified: Secondary | ICD-10-CM

## 2021-10-25 MED ORDER — AZELASTINE HCL 0.1 % NA SOLN: 2.0000 | Freq: Two times a day (BID) | NASAL | 12 refills | Status: DC

## 2021-10-25 MED ORDER — FLUCONAZOLE 150 MG PO TABS: ORAL_TABLET | ORAL | 0 refills | Status: DC

## 2021-10-25 NOTE — Addendum Note (Signed)
Addended by: Olin Hauser on: 10/25/2021 02:13 PM   Modules accepted: Orders

## 2021-10-25 NOTE — Telephone Encounter (Signed)
Pt called requesting return call.  Reviewed pt's chart, returned call for clarification, two identifiers used. Pt states that her legs hurt and when she stands for more than a few min, she can't get her feet to move. She denies any open wounds. She admits that her R foot stays cooler than her left, but she has full ROM and sensitivity. Appts for [place] and [facility] scheduled. Confirmed understanding.

## 2021-10-28 ENCOUNTER — Ambulatory Visit: Payer: Medicare Other | Admitting: Pharmacist

## 2021-10-28 DIAGNOSIS — Z720 Tobacco use: Secondary | ICD-10-CM

## 2021-10-28 DIAGNOSIS — E1169 Type 2 diabetes mellitus with other specified complication: Secondary | ICD-10-CM

## 2021-10-28 NOTE — Chronic Care Management (AMB) (Signed)
10/28/2021 Name: [name] MRN: [id number] DOB: [birth year]  No chief complaint on file.   [name] is a 56 y.o. year old female who presented for a telephone visit.   They were referred to the pharmacist by their PCP for assistance in managing diabetes and complex medication management.   Subjective:  Care Team: Primary Care Provider: Olin Hauser, DO  [facility] [place]; Next Scheduled Visit: 11/20/2021 Pain Management: [employer] in [place]  Today patient states her "legs are not working"/having more difficulty with supporting herself to stand. Reports that she contacted [facility] to report problem with her legs and now has appointments scheduled for follow up ultrasound and provider appointment on 11/20/2021. Reports also on waitlist for appointment.    Medication Access/Adherence  Current Pharmacy:  [facility] [place], [address] [place] [address] Phone: [phone] Fax: [place], [address] [place] [address] Phone: [phone] Fax: [phone]   Patient reports affordability concerns with their medications: No  Patient reports access/transportation concerns to their pharmacy: No  Patient reports adherence concerns with their medications:  No     Diabetes:  Current medications:  Metformin 1000 mg twice daily Pioglitazone 45 mg daily Mounjaro 5 mg weekly on Wednesdays  Medications tried in the past: Tradjenta, Tresiba  Current glucose readings: Reports recent morning fasting readings ranging 168-190  Current meal patterns:  - Breakfast: couple of pieces of toast and a fried or scrambled egg and sometimes fruit - Lunch: yogurt and fruit - Supper: chicken baked pork chop and green beans - Snacks: yogurt and fruit - Drinks water  and sweet tea (2 x 8 oz glasses/day)  Current physical activity: previously reported limited by right side deficit due to stroke and previous injury to right ankle from a fall; now reports further limited by current trouble with her legs  Tobacco Abuse: Currently smoking ~ 1 pack/day Motivation: quality of life, playing with grandkids, desire to be eligible for endovascular treatment  Not ready to set quit date today   Health Maintenance  Health Maintenance Due  Topic Date Due   COVID-19 Vaccine (1) Never done   FOOT EXAM  Never done   OPHTHALMOLOGY EXAM  Never done   Hepatitis C Screening  Never done   TETANUS/TDAP  Never done   PAP SMEAR-Modifier  Never done   COLONOSCOPY (Pts 45-47yr Insurance coverage will need to be confirmed)  Never done   MAMMOGRAM  Never done   Zoster Vaccines- Shingrix (1 of 2) Never done   Diabetic kidney evaluation - Urine ACR  04/23/2021     Objective: Lab Results  Component Value Date   HGBA1C 9.7 (H) 05/15/2021    Lab Results  Component Value Date   CREATININE 0.62 05/15/2021   BUN 13 05/15/2021   NA 139 05/15/2021   K 4.9 05/15/2021   CL 102 05/15/2021   CO2 29 05/15/2021    Lab Results  Component Value Date   CHOL 136 04/07/2017   HDL 31 (L) 04/07/2017   LDLCALC 67 04/07/2017   TRIG 191 (H) 04/07/2017   CHOLHDL 4.4 04/07/2017    Medications Reviewed Today     Reviewed by DRennis Petty RPH-CPP (Pharmacist) on 10/28/21 at 1548  Med List Status: <None>  Medication Order Taking? Sig Documenting Provider Last Dose Status Informant  albuterol (VENTOLIN HFA) 108 (90 Base) MCG/ACT inhaler [id number] Yes USE 2 INHALATIONS BY MOUTH EVERY 4 HOURS AS NEEDED FOR WHEEZING  OR SHORTNESS OF BREATH Karamalegos, Devonne Doughty, DO Taking Active   atorvastatin (LIPITOR) 40 MG tablet [id number] Yes Take 1 tablet (40 mg total) by mouth at bedtime. Olin Hauser, DO Taking Active   azelastine (ASTELIN) 0.1 % nasal spray [id number]   Place 2 sprays into both nostrils 2 (two) times daily. Olin Hauser, DO  Active   Buprenorphine HCl-Naloxone HCl 8-2 MG FILM [id number] Yes Place under the tongue 3 (three) times daily. [provider] Taking Active   buPROPion (WELLBUTRIN XL) 150 MG 24 hr tablet [id number] Yes TAKE 1 TABLET BY MOUTH DAILY Karamalegos, Devonne Doughty, DO Taking Active   citalopram (CELEXA) 40 MG tablet [id number] Yes Take 1 tablet (40 mg total) by mouth daily. Olin Hauser, DO Taking Active   clopidogrel (PLAVIX) 75 MG tablet [id number] Yes TAKE 1 TABLET BY MOUTH DAILY Karamalegos, Devonne Doughty, DO Taking Active   dicyclomine (BENTYL) 20 MG tablet [id number] Yes Take 1 tablet (20 mg total) by mouth 4 (four) times daily -  before meals and at bedtime. As needed for abdominal pain cramping Olin Hauser, DO Taking Active   fluconazole (DIFLUCAN) 150 MG tablet [id number]  Take one tablet by mouth on Day 1. Repeat dose 2nd tablet on Day 3. Parks Ranger, Devonne Doughty, DO  Active   fluticasone (FLONASE) 50 MCG/ACT nasal spray [id number] Yes USE 2 SPRAYS IN BOTH NOSTRILS  DAILY USE FOR 4 TO 6 WEEKS, THEN STOP AND USE SEASONALLY OR AS  NEEDED Parks Ranger, Devonne Doughty, DO Taking Active   gabapentin (NEURONTIN) 600 MG tablet [id number] Yes TAKE 1 TABLET(600 MG) BY MOUTH FOUR TIMES DAILY Karamalegos, Devonne Doughty, DO Taking Active   meclizine (ANTIVERT) 25 MG tablet [id number] Yes Take 1 tablet (25 mg total) by mouth 3 (three) times daily as needed for dizziness. Jearld Fenton, NP Taking Active   metFORMIN (GLUCOPHAGE) 1000 MG tablet [id number] Yes TAKE 1 TABLET(1000 MG) BY MOUTH TWICE DAILY WITH A MEAL Olin Hauser, DO Taking Active   MOUNJARO 5 MG/0.5ML Pen [id number] Yes ADMINISTER 5 MG UNDER THE SKIN 1 TIME A WEEK Olin Hauser, DO Taking Active   NYSTATIN powder [id number] Yes APPLY TOPICALLY A SMALL AMOUNT TWICE DAILY AS NEEDED Parks Ranger, Devonne Doughty, DO Taking Active    ondansetron (ZOFRAN-ODT) 4 MG disintegrating tablet [id number] Yes Take 1 tablet (4 mg total) by mouth every 8 (eight) hours as needed for nausea or vomiting. Olin Hauser, DO Taking Active   pantoprazole (PROTONIX) 40 MG tablet [id number] Yes TAKE 1 TABLET BY MOUTH DAILY Karamalegos, Devonne Doughty, DO Taking Active   pioglitazone (ACTOS) 45 MG tablet [id number] Yes Take 45 mg by mouth daily. [provider] Taking Active Pharmacy Records  QUEtiapine (SEROQUEL) 100 MG tablet [id number] Yes Take 1 tablet (100 mg total) by mouth at bedtime. Olin Hauser, DO Taking Active   RELISTOR 150 MG TABS [id number] Yes Take 3 tablets by mouth every morning. [provider] Taking Active   topiramate (TOPAMAX) 100 MG tablet [id number] Yes TAKE 1 TABLET BY MOUTH TWICE  DAILY Olin Hauser, DO Taking Active   TRELEGY ELLIPTA 100-62.5-25 MCG/ACT AEPB [id number] Yes INHALE 1 PUFF INTO THE LUNGS DAILY Olin Hauser, DO Taking Active   triamcinolone cream (KENALOG) 0.1 % [id number]  Apply  1 application. topically 2 (two) times daily. Olin Hauser, DO  Active   Vitamin D, Ergocalciferol, (DRISDOL) 50000 units CAPS capsule [id number] No Take 50,000 Units by mouth every 7 (seven) days. On Wednesday  Patient not taking: Reported on 10/28/2021   [provider] Not Taking Active               Assessment/Plan:   Diabetes: - Currently uncontrolled - Have reviewed long term cardiovascular and renal outcomes of uncontrolled blood sugar - Encourage to have regular well-balanced meals throughout the day, while controlling carbohydrate portion sizes - Reviewed dietary modifications including: reduce sugary beverage consumption, changing to a higher protein content yogurt, such as [place] yogurt - Reviewed lifestyle modifications including: increasing movement when able - Recommend to contact PCP office to schedule follow up appointment/A1C  lab/Vitamin D lab  - Recommend to check glucose, keep log of results and have this record to review at future appointments   Tobacco Abuse - Currently uncontrolled - Have provided information on 1 800 QUIT NOW support program   Follow Up Plan: Clinical Pharmacist will follow up with patient by telephone on 11/27/2021 at 1:45 pm  Wallace Cullens, PharmD, Para [place], [place] [phone]

## 2021-10-28 NOTE — Patient Instructions (Signed)
Goals Addressed             This Visit's Progress    Pharmacy Goals       Our goal A1c is less than 7%. This corresponds with fasting sugars less than 130 and 2 hour after meal sugars less than 180. Please check your blood sugar, keep a log of the results and have this for us to review during our next telephone appointment  Our goal bad cholesterol, or LDL, is less than 70 . This is why it is important to continue taking your atorvastatin  Please consider calling the [place] Quitline for their help with quitting smoking.  The [place] Quitline phone number is: 1-800-784-8669  Feel free to call me with any questions or concerns. I look forward to our next call!  Kimberlyn Quiocho Declan Mier, PharmD, BCACP, CPP Clinical Pharmacist [facility] [place] [phone]        

## 2021-11-04 ENCOUNTER — Ambulatory Visit: Payer: Medicare Other | Admitting: Surgery

## 2021-11-11 ENCOUNTER — Other Ambulatory Visit: Payer: Self-pay | Admitting: Family Medicine

## 2021-11-11 ENCOUNTER — Encounter (INDEPENDENT_AMBULATORY_CARE_PROVIDER_SITE_OTHER): Payer: Self-pay

## 2021-11-11 DIAGNOSIS — G894 Chronic pain syndrome: Secondary | ICD-10-CM

## 2021-11-11 DIAGNOSIS — Z79891 Long term (current) use of opiate analgesic: Secondary | ICD-10-CM | POA: Diagnosis not present

## 2021-11-11 DIAGNOSIS — M961 Postlaminectomy syndrome, not elsewhere classified: Secondary | ICD-10-CM | POA: Diagnosis not present

## 2021-11-11 DIAGNOSIS — I69351 Hemiplegia and hemiparesis following cerebral infarction affecting right dominant side: Secondary | ICD-10-CM | POA: Diagnosis not present

## 2021-11-11 DIAGNOSIS — M545 Low back pain, unspecified: Secondary | ICD-10-CM | POA: Diagnosis not present

## 2021-11-12 NOTE — Telephone Encounter (Signed)
Requested Prescriptions  Pending Prescriptions Disp Refills  . gabapentin (NEURONTIN) 600 MG tablet [Pharmacy Med Name: GABAPENTIN '600MG'$  TABLETS] 360 tablet 3    Sig: TAKE 1 TABLET(600 MG) BY MOUTH FOUR TIMES DAILY     Neurology: Anticonvulsants - gabapentin Passed - 11/11/2021  4:45 PM      Passed - Cr in normal range and within 360 days    Creat  Date Value Ref Range Status  05/15/2021 0.62 0.50 - 1.03 mg/dL Final         Passed - Completed PHQ-2 or PHQ-9 in the last 360 days      Passed - Valid encounter within last 12 months    Recent Outpatient Visits          2 weeks ago Type 2 diabetes mellitus with other specified complication, without long-term current use of insulin ([place])   [facility], Grayland Ormond A, RPH-CPP   3 weeks ago Acute non-recurrent frontal sinusitis   [facility] [place], Devonne Doughty, DO   1 month ago Acute bacterial sinusitis   [facility] [place], Coralie Keens, NP   5 months ago Hoarseness of voice   [facility] [place], Devonne Doughty, DO   6 months ago Type 2 diabetes mellitus with other specified complication, with long-term current use of insulin [facility])   [place], Devonne Doughty, DO

## 2021-11-13 ENCOUNTER — Telehealth: Payer: Self-pay | Admitting: Family Medicine

## 2021-11-13 ENCOUNTER — Ambulatory Visit: Payer: Self-pay | Admitting: *Deleted

## 2021-11-13 DIAGNOSIS — H6993 Unspecified Eustachian tube disorder, bilateral: Secondary | ICD-10-CM

## 2021-11-13 DIAGNOSIS — R42 Dizziness and giddiness: Secondary | ICD-10-CM

## 2021-11-13 NOTE — Telephone Encounter (Addendum)
Summary: ear pain, dizziness   Pt states she has taken 2 rounds of antibiotics for ear pain and fluid and it has not helped   Pt states she is now experiencing dizziness   Please fu w/ pt     Attempted to contact patient- left message to call office.

## 2021-11-13 NOTE — Telephone Encounter (Signed)
Referral Request - Has patient seen PCP for this complaint? Yes.    Referral for which specialty: ENT  Reason for referral: ear pain and fluid

## 2021-11-13 NOTE — Telephone Encounter (Signed)
  Chief Complaint: dizziness Symptoms: dizziness, ear pain, eye pain Frequency: 3 weeks Pertinent Negatives: Patient denies   Disposition: '[]'$ ED /'[]'$ Urgent Care (no appt availability in office) / '[]'$ Appointment(In office/virtual)/ '[]'$  [facility]/ '[]'$ Home Care/ '[x]'$ Refused Recommended Disposition /'[]'$ [facility]/ '[]'$  Follow-up with PCP Additional Notes: Patient is requesting referral to ENT specialist- patient states ATB, meclizine are not helping- declines appointment today- advised call back is she has changes or gets worse.  Reason for Disposition  [1] MODERATE dizziness (e.g., interferes with normal activities) AND [2] has been evaluated by doctor (or NP/PA) for this  Answer Assessment - Initial Assessment Questions 1. DESCRIPTION: "Describe your dizziness."     Feels faint- like passing out 2. LIGHTHEADED: "Do you feel lightheaded?" (e.g., somewhat faint, woozy, weak upon standing)     faint 3. VERTIGO: "Do you feel like either you or the room is spinning or tilting?" (i.e. vertigo)      Yes- patient feels like she is spinning 4. SEVERITY: "How bad is it?"  "Do you feel like you are going to faint?" "Can you stand and walk?"   - MILD: Feels slightly dizzy, but walking normally.   - MODERATE: Feels unsteady when walking, but not falling; interferes with normal activities (e.g., school, work).   - SEVERE: Unable to walk without falling, or requires assistance to walk without falling; feels like passing out now.      severe 5. ONSET:  "When did the dizziness begin?"     3 weeks 6. AGGRAVATING FACTORS: "Does anything make it worse?" (e.g., standing, change in head position)     Sitting up, laying down 7. HEART RATE: "Can you tell me your heart rate?" "How many beats in 15 seconds?"  (Note: not all patients can do this)       normal 8. CAUSE: "What do you think is causing the dizziness?"     Unsure- inner ear 9. RECURRENT SYMPTOM: "Have you had dizziness before?" If Yes,  ask: "When was the last time?" "What happened that time?"     Yes- medications not working 10. OTHER SYMPTOMS: "Do you have any other symptoms?" (e.g., fever, chest pain, vomiting, diarrhea, bleeding)       Pain R eye, ear pain-both ears(R worse)  Protocols used: Dizziness - Lightheadedness-A-AH

## 2021-11-13 NOTE — Telephone Encounter (Signed)
Already referred. Duplicate message  Nobie Putnam, [facility] 11/13/2021, 5:03 PM

## 2021-11-13 NOTE — Telephone Encounter (Signed)
Referral submitted  Nobie Putnam, [facility] 11/13/2021, 5:02 PM

## 2021-11-13 NOTE — Addendum Note (Signed)
Addended by: Olin Hauser on: 11/13/2021 05:02 PM   Modules accepted: Orders

## 2021-11-14 ENCOUNTER — Telehealth: Payer: Self-pay | Admitting: Family Medicine

## 2021-11-14 DIAGNOSIS — E1169 Type 2 diabetes mellitus with other specified complication: Secondary | ICD-10-CM

## 2021-11-14 NOTE — Telephone Encounter (Signed)
Requested medication (s) are due for refill today: yes  Requested medication (s) are on the active medication list: yes  Last refill:  08/23/21  Future visit scheduled: yes  Notes to clinic:  Medication not assigned to a protocol, review manually.      Requested Prescriptions  Pending Prescriptions Disp Refills   MOUNJARO 5 MG/0.5ML Pen [Pharmacy Med Name: MOUNJARO '5MG'$ /0.5ML PF PEN INJ] 2 mL 2    Sig: INJECT '5MG'$  UNDER THE SKIN ONCE A WEEK     Off-Protocol Failed - 11/14/2021  2:15 PM      Failed - Medication not assigned to a protocol, review manually.      Passed - Valid encounter within last 12 months    Recent Outpatient Visits           2 weeks ago Type 2 diabetes mellitus with other specified complication, without long-term current use of insulin ([place])   [facility], Grayland Ormond A, RPH-CPP   3 weeks ago Acute non-recurrent frontal sinusitis   [facility] [place], Devonne Doughty, DO   1 month ago Acute bacterial sinusitis   [facility] [place], Coralie Keens, NP   5 months ago Hoarseness of voice   [facility] Olin Hauser, DO   6 months ago Type 2 diabetes mellitus with other specified complication, with long-term current use of insulin [facility])   [place], Devonne Doughty, DO

## 2021-11-19 ENCOUNTER — Other Ambulatory Visit: Payer: Self-pay | Admitting: Family Medicine

## 2021-11-19 DIAGNOSIS — H6993 Unspecified Eustachian tube disorder, bilateral: Secondary | ICD-10-CM

## 2021-11-19 DIAGNOSIS — F5104 Psychophysiologic insomnia: Secondary | ICD-10-CM

## 2021-11-19 DIAGNOSIS — K219 Gastro-esophageal reflux disease without esophagitis: Secondary | ICD-10-CM

## 2021-11-19 DIAGNOSIS — G43711 Chronic migraine without aura, intractable, with status migrainosus: Secondary | ICD-10-CM

## 2021-11-19 DIAGNOSIS — J321 Chronic frontal sinusitis: Secondary | ICD-10-CM

## 2021-11-19 DIAGNOSIS — E782 Mixed hyperlipidemia: Secondary | ICD-10-CM

## 2021-11-19 DIAGNOSIS — I6930 Unspecified sequelae of cerebral infarction: Secondary | ICD-10-CM

## 2021-11-19 DIAGNOSIS — F331 Major depressive disorder, recurrent, moderate: Secondary | ICD-10-CM

## 2021-11-19 NOTE — Telephone Encounter (Signed)
Pt called to ask the dr if she can get her refills asap.  Pt states she is out and desperately needs 2 of her meds.  I offered to ask for a short supply to be sent to her pharmacy, pt declined.

## 2021-11-19 NOTE — Telephone Encounter (Signed)
Requested Prescriptions  Pending Prescriptions Disp Refills   atorvastatin (LIPITOR) 40 MG tablet [Pharmacy Med Name: Atorvastatin Calcium 40 MG Oral Tablet] 90 tablet 0    Sig: TAKE 1 TABLET BY MOUTH AT  BEDTIME     Cardiovascular:  Antilipid - Statins Failed - 11/19/2021  2:19 PM      Failed - Lipid Panel in normal range within the last 12 months    Cholesterol  Date Value Ref Range Status  04/07/2017 136 0 - 200 mg/dL Final  10/07/2012 207 (H) 0 - 200 mg/dL Final   Ldl Cholesterol, Calc  Date Value Ref Range Status  10/07/2012 127 (H) 0 - 100 mg/dL Final   LDL Cholesterol  Date Value Ref Range Status  04/07/2017 67 0 - 99 mg/dL Final    Comment:           Total Cholesterol/HDL:CHD Risk Coronary Heart Disease Risk Table                     Men   Women  1/2 Average Risk   3.4   3.3  Average Risk       5.0   4.4  2 X Average Risk   9.6   7.1  3 X Average Risk  23.4   11.0        Use the calculated Patient Ratio above and the CHD Risk Table to determine the patient's CHD Risk.        ATP III CLASSIFICATION (LDL):  <100     mg/dL   Optimal  100-129  mg/dL   Near or Above                    Optimal  130-159  mg/dL   Borderline  160-189  mg/dL   High  >190     mg/dL   Very High Performed at [facility] [address] [place], [place] [address]    HDL Cholesterol  Date Value Ref Range Status  10/07/2012 25 (L) 40 - 60 mg/dL Final   HDL  Date Value Ref Range Status  04/07/2017 31 (L) >40 mg/dL Final   Triglycerides  Date Value Ref Range Status  04/07/2017 191 (H) <150 mg/dL Final  10/07/2012 273 (H) 0 - 200 mg/dL Final         Passed - Patient is not pregnant      Passed - Valid encounter within last 12 months    Recent Outpatient Visits           3 weeks ago Type 2 diabetes mellitus with other specified complication, without long-term current use of insulin ([place])   [facility], Grayland Ormond A, RPH-CPP   1 month ago Acute  non-recurrent frontal sinusitis   [facility] [place], Devonne Doughty, DO   1 month ago Acute bacterial sinusitis   [facility] [place], Coralie Keens, NP   5 months ago Hoarseness of voice   [facility] [place], Devonne Doughty, DO   6 months ago Type 2 diabetes mellitus with other specified complication, with long-term current use of insulin [facility])   [place], DO               topiramate (TOPAMAX) 100 MG tablet [Pharmacy Med Name: Topiramate 100 MG Oral Tablet] 180 tablet 3    Sig: TAKE 1 TABLET BY MOUTH TWICE  DAILY  Neurology: Anticonvulsants - topiramate & zonisamide Failed - 11/19/2021  2:19 PM      Failed - ALT in normal range and within 360 days    ALT  Date Value Ref Range Status  08/18/2017 16 0 - 44 U/L Final   SGPT (ALT)  Date Value Ref Range Status  09/22/2013 16 U/L Final    Comment:    14-63 NOTE: New Reference Range 08/02/13          Failed - AST in normal range and within 360 days    AST  Date Value Ref Range Status  08/18/2017 22 15 - 41 U/L Final   SGOT(AST)  Date Value Ref Range Status  09/22/2013 17 15 - 37 Unit/L Final         Passed - Cr in normal range and within 360 days    Creat  Date Value Ref Range Status  05/15/2021 0.62 0.50 - 1.03 mg/dL Final         Passed - CO2 in normal range and within 360 days    CO2  Date Value Ref Range Status  05/15/2021 29 20 - 32 mmol/L Final   Co2  Date Value Ref Range Status  09/22/2013 27 21 - 32 mmol/L Final         Passed - Completed PHQ-2 or PHQ-9 in the last 360 days      Passed - Valid encounter within last 12 months    Recent Outpatient Visits           3 weeks ago Type 2 diabetes mellitus with other specified complication, without long-term current use of insulin ([place])   [place], Grayland Ormond A, RPH-CPP   1 month ago Acute non-recurrent frontal sinusitis   [facility] [place], Devonne Doughty, DO   1 month ago Acute bacterial sinusitis   [facility] [place], Coralie Keens, NP   5 months ago Hoarseness of voice   [facility] [place], Devonne Doughty, DO   6 months ago Type 2 diabetes mellitus with other specified complication, with long-term current use of insulin ([place])   [facility], Devonne Doughty, DO               QUEtiapine (SEROQUEL) 100 MG tablet [Pharmacy Med Name: QUEtiapine Fumarate 100 MG Oral Tablet] 90 tablet 3    Sig: TAKE 1 TABLET BY MOUTH AT  BEDTIME     Not Delegated - Psychiatry:  Antipsychotics - Second Generation (Atypical) - quetiapine Failed - 11/19/2021  2:19 PM      Failed - This refill cannot be delegated      Failed - TSH in normal range and within 360 days    TSH  Date Value Ref Range Status  08/09/2017 1.086 0.350 - 4.500 uIU/mL Final    Comment:    Performed by a 3rd Generation assay with a functional sensitivity of <=0.01 uIU/mL. Performed at [facility], [address] [place], [place] [address]          Failed - Lipid Panel in normal range within the last 12 months    Cholesterol  Date Value Ref Range Status  04/07/2017 136 0 - 200 mg/dL Final  10/07/2012 207 (H) 0 - 200 mg/dL Final   Ldl Cholesterol, Calc  Date Value Ref Range Status  10/07/2012 127 (H) 0 - 100 mg/dL Final   LDL Cholesterol  Date Value Ref Range Status  04/07/2017  67 0 - 99 mg/dL Final    Comment:           Total Cholesterol/HDL:CHD Risk Coronary Heart Disease Risk Table                     Men   Women  1/2 Average Risk   3.4   3.3  Average Risk       5.0   4.4  2 X Average Risk   9.6   7.1  3 X Average Risk  23.4   11.0        Use the calculated Patient Ratio above and the CHD Risk Table to determine the patient's CHD Risk.        ATP III CLASSIFICATION (LDL):  <100     mg/dL   Optimal  100-129  mg/dL   Near or Above                    Optimal   130-159  mg/dL   Borderline  160-189  mg/dL   High  >190     mg/dL   Very High Performed at [facility] [address], [place], [place] [address]    HDL Cholesterol  Date Value Ref Range Status  10/07/2012 25 (L) 40 - 60 mg/dL Final   HDL  Date Value Ref Range Status  04/07/2017 31 (L) >40 mg/dL Final   Triglycerides  Date Value Ref Range Status  04/07/2017 191 (H) <150 mg/dL Final  10/07/2012 273 (H) 0 - 200 mg/dL Final         Failed - CBC within normal limits and completed in the last 12 months    WBC  Date Value Ref Range Status  08/18/2017 9.5 4.0 - 10.5 K/uL Final   RBC  Date Value Ref Range Status  08/18/2017 4.94 3.87 - 5.11 MIL/uL Final   Hemoglobin  Date Value Ref Range Status  08/18/2017 15.3 (H) 12.0 - 15.0 g/dL Final   HGB  Date Value Ref Range Status  09/22/2013 14.6 12.0 - 16.0 g/dL Final   HCT  Date Value Ref Range Status  08/18/2017 45.0 36.0 - 46.0 % Final  09/22/2013 44.3 35.0 - 47.0 % Final   MCHC  Date Value Ref Range Status  08/18/2017 31.9 30.0 - 36.0 g/dL Final   [facility]  Date Value Ref Range Status  08/18/2017 28.5 26.0 - 34.0 pg Final   MCV  Date Value Ref Range Status  08/18/2017 89.5 78.0 - 100.0 fL Final  09/22/2013 90 80 - 100 fL Final   No results found for: "PLTCOUNTKUC", "LABPLAT", "POCPLA" RDW  Date Value Ref Range Status  08/18/2017 13.8 11.5 - 15.5 % Final  09/22/2013 13.8 11.5 - 14.5 % Final         Failed - CMP within normal limits and completed in the last 12 months    Albumin  Date Value Ref Range Status  08/18/2017 3.0 (L) 3.5 - 5.0 g/dL Final  09/22/2013 2.6 (L) 3.4 - 5.0 g/dL Final   Alkaline Phosphatase  Date Value Ref Range Status  08/18/2017 59 38 - 126 U/L Final  09/22/2013 78 Unit/L Final    Comment:    46-116 NOTE: New Reference Range 08/02/13    ALT  Date Value Ref Range Status  08/18/2017 16 0 - 44 U/L Final   SGPT (ALT)  Date Value Ref Range Status  09/22/2013 16 U/L Final     Comment:  14-63 NOTE: New Reference Range 08/02/13    AST  Date Value Ref Range Status  08/18/2017 22 15 - 41 U/L Final   SGOT(AST)  Date Value Ref Range Status  09/22/2013 17 15 - 37 Unit/L Final   BUN  Date Value Ref Range Status  05/15/2021 13 7 - 25 mg/dL Final  09/22/2013 11 7 - 18 mg/dL Final   Calcium  Date Value Ref Range Status  05/15/2021 8.6 8.6 - 10.4 mg/dL Final   Calcium, Total  Date Value Ref Range Status  09/22/2013 8.3 (L) 8.5 - 10.1 mg/dL Final   Calcium, Ion  Date Value Ref Range Status  04/07/2017 1.17 1.15 - 1.40 mmol/L Final   CO2  Date Value Ref Range Status  05/15/2021 29 20 - 32 mmol/L Final   Co2  Date Value Ref Range Status  09/22/2013 27 21 - 32 mmol/L Final   TCO2  Date Value Ref Range Status  04/07/2017 29 22 - 32 mmol/L Final   Creat  Date Value Ref Range Status  05/15/2021 0.62 0.50 - 1.03 mg/dL Final   Glucose  Date Value Ref Range Status  09/22/2013 335 (H) 65 - 99 mg/dL Final   Glucose, Bld  Date Value Ref Range Status  05/15/2021 261 (H) 65 - 139 mg/dL Final    Comment:    .        Non-fasting reference interval .    Glucose-Capillary  Date Value Ref Range Status  08/18/2017 120 (H) 70 - 99 mg/dL Final   Potassium  Date Value Ref Range Status  05/15/2021 4.9 3.5 - 5.3 mmol/L Final  09/22/2013 3.8 3.5 - 5.1 mmol/L Final   Sodium  Date Value Ref Range Status  05/15/2021 139 135 - 146 mmol/L Final  09/22/2013 135 (L) 136 - 145 mmol/L Final   Total Bilirubin  Date Value Ref Range Status  08/18/2017 0.9 0.3 - 1.2 mg/dL Final   Bilirubin,Total  Date Value Ref Range Status  09/22/2013 0.2 0.2 - 1.0 mg/dL Final   Bilirubin, Direct  Date Value Ref Range Status  08/28/2012 < 0.1 0.00 - 0.20 mg/dL Final   Protein, ur  Date Value Ref Range Status  08/18/2017 NEGATIVE NEGATIVE mg/dL Final   Total Protein  Date Value Ref Range Status  08/18/2017 7.0 6.5 - 8.1 g/dL Final  09/22/2013 7.2 6.4 - 8.2 g/dL  Final   EGFR (African American)  Date Value Ref Range Status  09/22/2013 >60  Final   GFR calc Af Amer  Date Value Ref Range Status  08/18/2017 >60 >60 mL/min Final    Comment:    (NOTE) The eGFR has been calculated using the CKD EPI equation. This calculation has not been validated in all clinical situations. eGFR's persistently <60 mL/min signify possible Chronic Kidney Disease.    GFR  Date Value Ref Range Status  09/23/2017 91.12 >60.00 mL/min Final   eGFR  Date Value Ref Range Status  05/15/2021 104 > OR = 60 mL/min/1.92m Final    Comment:    The eGFR is based on the CKD-EPI 2021 equation. To calculate  the new eGFR from a previous Creatinine or Cystatin C result, go to https://www.kidney.org/professionals/ kdoqi/gfr%5Fcalculator    EGFR (Non-African Amer.)  Date Value Ref Range Status  09/22/2013 >60  Final    Comment:    eGFR values <6108mmin/1.73 m2 may be an indication of chronic kidney disease (CKD). Calculated eGFR is useful in patients with stable renal function. The eGFR calculation  will not be reliable in acutely ill patients when serum creatinine is changing rapidly. It is not useful in  patients on dialysis. The eGFR calculation may not be applicable to patients at the low and high extremes of body sizes, pregnant women, and vegetarians.    GFR calc non Af Amer  Date Value Ref Range Status  08/18/2017 >60 >60 mL/min Final         Passed - Completed PHQ-2 or PHQ-9 in the last 360 days      Passed - Last BP in normal range    BP Readings from Last 1 Encounters:  10/18/21 119/77         Passed - Last Heart Rate in normal range    Pulse Readings from Last 1 Encounters:  10/18/21 89         Passed - Valid encounter within last 6 months    Recent Outpatient Visits           3 weeks ago Type 2 diabetes mellitus with other specified complication, without long-term current use of insulin ([place])   [facility] [place], Grayland Ormond  A, RPH-CPP   1 month ago Acute non-recurrent frontal sinusitis   [facility] [place], Devonne Doughty, DO   1 month ago Acute bacterial sinusitis   [facility] [place], [place] W, NP   5 months ago Hoarseness of voice   [facility] [place], Devonne Doughty, DO   6 months ago Type 2 diabetes mellitus with other specified complication, with long-term current use of insulin ([place])   [facility], Devonne Doughty, DO               fluticasone [facility]) 50 MCG/ACT nasal spray [Pharmacy Med Name: Fluticasone Propionate 50 MCG/ACT Nasal Suspension] 48 g 0    Sig: USE 2 SPRAYS IN BOTH NOSTRILS  DAILY USE FOR 4 TO 6 WEEKS, THEN STOP AND USE SEASONALLY OR AS  NEEDED     Ear, Nose, and Throat: Nasal Preparations - Corticosteroids Passed - 11/19/2021  2:19 PM      Passed - Valid encounter within last 12 months    Recent Outpatient Visits           3 weeks ago Type 2 diabetes mellitus with other specified complication, without long-term current use of insulin ([place])   [facility], Grayland Ormond A, RPH-CPP   1 month ago Acute non-recurrent frontal sinusitis   [facility] [place], Devonne Doughty, DO   1 month ago Acute bacterial sinusitis   [facility] [place], Coralie Keens, NP   5 months ago Hoarseness of voice   [facility] [place], Devonne Doughty, DO   6 months ago Type 2 diabetes mellitus with other specified complication, with long-term current use of insulin ([place])   [facility], Devonne Doughty, DO               citalopram (CELEXA) 40 MG tablet [Pharmacy Med Name: Citalopram Hydrobromide 40 MG Oral Tablet] 90 tablet 3    Sig: TAKE 1 TABLET BY MOUTH DAILY     Psychiatry:  Antidepressants - SSRI Passed - 11/19/2021  2:19 PM      Passed - Completed PHQ-2 or PHQ-9 in the last 360 days      Passed - Valid encounter within last 6  months    Recent Outpatient Visits  3 weeks ago Type 2 diabetes mellitus with other specified complication, without long-term current use of insulin ([place])   [facility] [place], Grayland Ormond A, RPH-CPP   1 month ago Acute non-recurrent frontal sinusitis   [facility] [place], Devonne Doughty, DO   1 month ago Acute bacterial sinusitis   [facility] [place], Coralie Keens, NP   5 months ago Hoarseness of voice   [facility] Olin Hauser, DO   6 months ago Type 2 diabetes mellitus with other specified complication, with long-term current use of insulin ([place])   [place], DO               pantoprazole ([place]) 40 MG tablet [Pharmacy Med Name: Pantoprazole Sodium 40 MG Oral Tablet Delayed Release] 90 tablet 3    Sig: TAKE 1 TABLET BY MOUTH DAILY     Gastroenterology: Proton Pump Inhibitors Passed - 11/19/2021  2:19 PM      Passed - Valid encounter within last 12 months    Recent Outpatient Visits           3 weeks ago Type 2 diabetes mellitus with other specified complication, without long-term current use of insulin ([place])   [facility] [place], Grayland Ormond A, RPH-CPP   1 month ago Acute non-recurrent frontal sinusitis   [facility] [place], Devonne Doughty, DO   1 month ago Acute bacterial sinusitis   [facility] [place], Coralie Keens, NP   5 months ago Hoarseness of voice   [facility] [place], Devonne Doughty, DO   6 months ago Type 2 diabetes mellitus with other specified complication, with long-term current use of insulin ([place])   [facility], Devonne Doughty, DO               buPROPion (WELLBUTRIN XL) 150 MG 24 hr tablet [Pharmacy Med Name: buPROPion HCl ER (XL) 150 MG Oral Tablet Extended Release 24 Hour] 90 tablet 3    Sig: TAKE 1 TABLET BY MOUTH DAILY     Psychiatry:  Antidepressants - bupropion Failed - 11/19/2021  2:19 PM      Failed - AST in normal range and within 360 days    AST  Date Value Ref Range Status  08/18/2017 22 15 - 41 U/L Final   SGOT(AST)  Date Value Ref Range Status  09/22/2013 17 15 - 37 Unit/L Final         Failed - ALT in normal range and within 360 days    ALT  Date Value Ref Range Status  08/18/2017 16 0 - 44 U/L Final   SGPT (ALT)  Date Value Ref Range Status  09/22/2013 16 U/L Final    Comment:    14-63 NOTE: New Reference Range 08/02/13          Passed - Cr in normal range and within 360 days    Creat  Date Value Ref Range Status  05/15/2021 0.62 0.50 - 1.03 mg/dL Final         Passed - Completed PHQ-2 or PHQ-9 in the last 360 days      Passed - Last BP in normal range    BP Readings from Last 1 Encounters:  10/18/21 119/77         Passed - Valid encounter within last 6 months    Recent Outpatient Visits  3 weeks ago Type 2 diabetes mellitus with other specified complication, without long-term current use of insulin ([place])   [facility] [place], Grayland Ormond A, RPH-CPP   1 month ago Acute non-recurrent frontal sinusitis   [facility] [place], Devonne Doughty, DO   1 month ago Acute bacterial sinusitis   [facility] [place], Coralie Keens, NP   5 months ago Hoarseness of voice   [facility] Olin Hauser, DO   6 months ago Type 2 diabetes mellitus with other specified complication, with long-term current use of insulin ([place])   [facility], Devonne Doughty, DO               clopidogrel (PLAVIX) 75 MG tablet [Pharmacy Med Name: Clopidogrel Bisulfate 75 MG Oral Tablet] 100 tablet 2    Sig: TAKE 1 TABLET BY MOUTH DAILY     Hematology: Antiplatelets - clopidogrel Failed - 11/19/2021  2:19 PM      Failed - HCT in normal range and within 180 days    HCT  Date Value Ref Range Status  08/18/2017 45.0  36.0 - 46.0 % Final  09/22/2013 44.3 35.0 - 47.0 % Final         Failed - HGB in normal range and within 180 days    Hemoglobin  Date Value Ref Range Status  08/18/2017 15.3 (H) 12.0 - 15.0 g/dL Final   HGB  Date Value Ref Range Status  09/22/2013 14.6 12.0 - 16.0 g/dL Final         Failed - PLT in normal range and within 180 days    Platelets  Date Value Ref Range Status  08/18/2017 316 150 - 400 K/uL Final   Platelet  Date Value Ref Range Status  09/22/2013 300 150 - 440 x10 3/mm 3 Final         Passed - Cr in normal range and within 360 days    Creat  Date Value Ref Range Status  05/15/2021 0.62 0.50 - 1.03 mg/dL Final         Passed - Valid encounter within last 6 months    Recent Outpatient Visits           3 weeks ago Type 2 diabetes mellitus with other specified complication, without long-term current use of insulin ([place])   [place], Grayland Ormond A, RPH-CPP   1 month ago Acute non-recurrent frontal sinusitis   [facility] Olin Hauser, DO   1 month ago Acute bacterial sinusitis   [facility] [place], Coralie Keens, NP   5 months ago Hoarseness of voice   [facility] Olin Hauser, DO   6 months ago Type 2 diabetes mellitus with other specified complication, with long-term current use of insulin [facility])   [place], Devonne Doughty, DO

## 2021-11-19 NOTE — Telephone Encounter (Signed)
Requested medication (s) are due for refill today: no   Requested medication (s) are on the active medication list: yes  Last refill:  multiple dates  Future visit scheduled: no  Notes to clinic:  Unable to refill per protocol, cannot delegate Seroquel. Other medication are too soon to refill, routing for review.      Requested Prescriptions  Pending Prescriptions Disp Refills   topiramate (TOPAMAX) 100 MG tablet [Pharmacy Med Name: Topiramate 100 MG Oral Tablet] 180 tablet 3    Sig: TAKE 1 TABLET BY MOUTH TWICE  DAILY     Neurology: Anticonvulsants - topiramate & zonisamide Failed - 11/19/2021  2:19 PM      Failed - ALT in normal range and within 360 days    ALT  Date Value Ref Range Status  08/18/2017 16 0 - 44 U/L Final   SGPT (ALT)  Date Value Ref Range Status  09/22/2013 16 U/L Final    Comment:    14-63 NOTE: New Reference Range 08/02/13          Failed - AST in normal range and within 360 days    AST  Date Value Ref Range Status  08/18/2017 22 15 - 41 U/L Final   SGOT(AST)  Date Value Ref Range Status  09/22/2013 17 15 - 37 Unit/L Final         Passed - Cr in normal range and within 360 days    Creat  Date Value Ref Range Status  05/15/2021 0.62 0.50 - 1.03 mg/dL Final         Passed - CO2 in normal range and within 360 days    CO2  Date Value Ref Range Status  05/15/2021 29 20 - 32 mmol/L Final   Co2  Date Value Ref Range Status  09/22/2013 27 21 - 32 mmol/L Final         Passed - Completed PHQ-2 or PHQ-9 in the last 360 days      Passed - Valid encounter within last 12 months    Recent Outpatient Visits           3 weeks ago Type 2 diabetes mellitus with other specified complication, without long-term current use of insulin ([place])   [place], Grayland Ormond A, RPH-CPP   1 month ago Acute non-recurrent frontal sinusitis   [facility] Olin Hauser, DO   1 month ago Acute bacterial sinusitis    [facility] [place], Coralie Keens, NP   5 months ago Hoarseness of voice   [facility] [place], Devonne Doughty, DO   6 months ago Type 2 diabetes mellitus with other specified complication, with long-term current use of insulin ([place])   [facility], Devonne Doughty, DO               QUEtiapine (SEROQUEL) 100 MG tablet [Pharmacy Med Name: QUEtiapine Fumarate 100 MG Oral Tablet] 90 tablet 3    Sig: TAKE 1 TABLET BY MOUTH AT  BEDTIME     Not Delegated - Psychiatry:  Antipsychotics - Second Generation (Atypical) - quetiapine Failed - 11/19/2021  2:19 PM      Failed - This refill cannot be delegated      Failed - TSH in normal range and within 360 days    TSH  Date Value Ref Range Status  08/09/2017 1.086 0.350 - 4.500 uIU/mL Final    Comment:    Performed by a 3rd  Generation assay with a functional sensitivity of <=0.01 uIU/mL. Performed at [facility], [address] [place], [place] [address]          Failed - Lipid Panel in normal range within the last 12 months    Cholesterol  Date Value Ref Range Status  04/07/2017 136 0 - 200 mg/dL Final  10/07/2012 207 (H) 0 - 200 mg/dL Final   Ldl Cholesterol, Calc  Date Value Ref Range Status  10/07/2012 127 (H) 0 - 100 mg/dL Final   LDL Cholesterol  Date Value Ref Range Status  04/07/2017 67 0 - 99 mg/dL Final    Comment:           Total Cholesterol/HDL:CHD Risk Coronary Heart Disease Risk Table                     Men   Women  1/2 Average Risk   3.4   3.3  Average Risk       5.0   4.4  2 X Average Risk   9.6   7.1  3 X Average Risk  23.4   11.0        Use the calculated Patient Ratio above and the CHD Risk Table to determine the patient's CHD Risk.        ATP III CLASSIFICATION (LDL):  <100     mg/dL   Optimal  100-129  mg/dL   Near or Above                    Optimal  130-159  mg/dL   Borderline  160-189  mg/dL   High  >190     mg/dL   Very  High Performed at [place] [address], [place], [place] [address]    HDL Cholesterol  Date Value Ref Range Status  10/07/2012 25 (L) 40 - 60 mg/dL Final   HDL  Date Value Ref Range Status  04/07/2017 31 (L) >40 mg/dL Final   Triglycerides  Date Value Ref Range Status  04/07/2017 191 (H) <150 mg/dL Final  10/07/2012 273 (H) 0 - 200 mg/dL Final         Failed - CBC within normal limits and completed in the last 12 months    WBC  Date Value Ref Range Status  08/18/2017 9.5 4.0 - 10.5 K/uL Final   RBC  Date Value Ref Range Status  08/18/2017 4.94 3.87 - 5.11 MIL/uL Final   Hemoglobin  Date Value Ref Range Status  08/18/2017 15.3 (H) 12.0 - 15.0 g/dL Final   HGB  Date Value Ref Range Status  09/22/2013 14.6 12.0 - 16.0 g/dL Final   HCT  Date Value Ref Range Status  08/18/2017 45.0 36.0 - 46.0 % Final  09/22/2013 44.3 35.0 - 47.0 % Final   MCHC  Date Value Ref Range Status  08/18/2017 31.9 30.0 - 36.0 g/dL Final   [facility]  Date Value Ref Range Status  08/18/2017 28.5 26.0 - 34.0 pg Final   MCV  Date Value Ref Range Status  08/18/2017 89.5 78.0 - 100.0 fL Final  09/22/2013 90 80 - 100 fL Final   No results found for: "PLTCOUNTKUC", "LABPLAT", "POCPLA" RDW  Date Value Ref Range Status  08/18/2017 13.8 11.5 - 15.5 % Final  09/22/2013 13.8 11.5 - 14.5 % Final         Failed - CMP within normal limits and completed in the last 12 months  Albumin  Date Value Ref Range Status  08/18/2017 3.0 (L) 3.5 - 5.0 g/dL Final  09/22/2013 2.6 (L) 3.4 - 5.0 g/dL Final   Alkaline Phosphatase  Date Value Ref Range Status  08/18/2017 59 38 - 126 U/L Final  09/22/2013 78 Unit/L Final    Comment:    46-116 NOTE: New Reference Range 08/02/13    ALT  Date Value Ref Range Status  08/18/2017 16 0 - 44 U/L Final   SGPT (ALT)  Date Value Ref Range Status  09/22/2013 16 U/L Final    Comment:    14-63 NOTE: New Reference Range 08/02/13    AST  Date  Value Ref Range Status  08/18/2017 22 15 - 41 U/L Final   SGOT(AST)  Date Value Ref Range Status  09/22/2013 17 15 - 37 Unit/L Final   BUN  Date Value Ref Range Status  05/15/2021 13 7 - 25 mg/dL Final  09/22/2013 11 7 - 18 mg/dL Final   Calcium  Date Value Ref Range Status  05/15/2021 8.6 8.6 - 10.4 mg/dL Final   Calcium, Total  Date Value Ref Range Status  09/22/2013 8.3 (L) 8.5 - 10.1 mg/dL Final   Calcium, Ion  Date Value Ref Range Status  04/07/2017 1.17 1.15 - 1.40 mmol/L Final   CO2  Date Value Ref Range Status  05/15/2021 29 20 - 32 mmol/L Final   Co2  Date Value Ref Range Status  09/22/2013 27 21 - 32 mmol/L Final   TCO2  Date Value Ref Range Status  04/07/2017 29 22 - 32 mmol/L Final   Creat  Date Value Ref Range Status  05/15/2021 0.62 0.50 - 1.03 mg/dL Final   Glucose  Date Value Ref Range Status  09/22/2013 335 (H) 65 - 99 mg/dL Final   Glucose, Bld  Date Value Ref Range Status  05/15/2021 261 (H) 65 - 139 mg/dL Final    Comment:    .        Non-fasting reference interval .    Glucose-Capillary  Date Value Ref Range Status  08/18/2017 120 (H) 70 - 99 mg/dL Final   Potassium  Date Value Ref Range Status  05/15/2021 4.9 3.5 - 5.3 mmol/L Final  09/22/2013 3.8 3.5 - 5.1 mmol/L Final   Sodium  Date Value Ref Range Status  05/15/2021 139 135 - 146 mmol/L Final  09/22/2013 135 (L) 136 - 145 mmol/L Final   Total Bilirubin  Date Value Ref Range Status  08/18/2017 0.9 0.3 - 1.2 mg/dL Final   Bilirubin,Total  Date Value Ref Range Status  09/22/2013 0.2 0.2 - 1.0 mg/dL Final   Bilirubin, Direct  Date Value Ref Range Status  08/28/2012 < 0.1 0.00 - 0.20 mg/dL Final   Protein, ur  Date Value Ref Range Status  08/18/2017 NEGATIVE NEGATIVE mg/dL Final   Total Protein  Date Value Ref Range Status  08/18/2017 7.0 6.5 - 8.1 g/dL Final  09/22/2013 7.2 6.4 - 8.2 g/dL Final   EGFR (African American)  Date Value Ref Range Status   09/22/2013 >60  Final   GFR calc Af Amer  Date Value Ref Range Status  08/18/2017 >60 >60 mL/min Final    Comment:    (NOTE) The eGFR has been calculated using the CKD EPI equation. This calculation has not been validated in all clinical situations. eGFR's persistently <60 mL/min signify possible Chronic Kidney Disease.    GFR  Date Value Ref Range Status  09/23/2017 91.12 >60.00 mL/min Final  eGFR  Date Value Ref Range Status  05/15/2021 104 > OR = 60 mL/min/1.30m Final    Comment:    The eGFR is based on the CKD-EPI 2021 equation. To calculate  the new eGFR from a previous Creatinine or Cystatin C result, go to https://www.kidney.org/professionals/ kdoqi/gfr%5Fcalculator    EGFR (Non-African Amer.)  Date Value Ref Range Status  09/22/2013 >60  Final    Comment:    eGFR values <634mmin/1.73 m2 may be an indication of chronic kidney disease (CKD). Calculated eGFR is useful in patients with stable renal function. The eGFR calculation will not be reliable in acutely ill patients when serum creatinine is changing rapidly. It is not useful in  patients on dialysis. The eGFR calculation may not be applicable to patients at the low and high extremes of body sizes, pregnant women, and vegetarians.    GFR calc non Af Amer  Date Value Ref Range Status  08/18/2017 >60 >60 mL/min Final         Passed - Completed PHQ-2 or PHQ-9 in the last 360 days      Passed - Last BP in normal range    BP Readings from Last 1 Encounters:  10/18/21 119/77         Passed - Last Heart Rate in normal range    Pulse Readings from Last 1 Encounters:  10/18/21 89         Passed - Valid encounter within last 6 months    Recent Outpatient Visits           3 weeks ago Type 2 diabetes mellitus with other specified complication, without long-term current use of insulin ([place]  [facility] ElGrayland Ormond, RPH-CPP   1 month ago Acute non-recurrent frontal sinusitis    [facility] DoughtyDO   1 month ago Acute bacterial sinusitis   [facility]   5 months ago Hoarseness of voice   [facility]   6 months ago Type 2 diabetes mellitus with other specified complication, with long-term current use of insulin (HCBal[place]  [facility]oughtyDO               fluticasone ([facility]50 MCG/ACT nasal spray [Pharmacy Med Name: Fluticasone Propionate 50 MCG/ACT Nasal Suspension] 48 g 0    Sig: USE 2 SPRAYS IN BOTH NOSTRILS  DAILY USE FOR 4 TO 6 WEEKS, THEN STOP AND USE SEASONALLY OR AS  NEEDED     Ear, Nose, and Throat: Nasal Preparations - Corticosteroids Passed - 11/19/2021  2:19 PM      Passed - Valid encounter within last 12 months    Recent Outpatient Visits           3 weeks ago Type 2 diabetes mellitus with other specified complication, without long-term current use of insulin ([place]CF[place]  [facility] Ormond, RPH-CPP   1 month ago Acute non-recurrent frontal sinusitis   [facility] DoughtyDO   1 month ago Acute bacterial sinusitis   [facility] CarylReCoralie KeensNP   5 months ago Hoarseness of voice   SoB[place]DO   6 months ago Type 2 diabetes mellitus with other specified complication, with long-term current use of insulin ([facility]  SoFl[facility] CenterAlDevonne Doughty  DO               citalopram (CELEXA) 40 MG tablet [Pharmacy Med Name: Citalopram Hydrobromide 40 MG Oral Tablet] 90 tablet 3    Sig: TAKE 1 TABLET BY MOUTH DAILY     Psychiatry:  Antidepressants - SSRI Passed - 11/19/2021  2:19 PM      Passed - Completed PHQ-2 or PHQ-9 in the last 360 days      Passed - Valid encounter within last 6 months    Recent Outpatient Visits           3 weeks ago Type 2  diabetes mellitus with other specified complication, without long-term current use of insulin ([place])   [facility] [place], Grayland Ormond A, RPH-CPP   1 month ago Acute non-recurrent frontal sinusitis   [facility] [place], Devonne Doughty, DO   1 month ago Acute bacterial sinusitis   [facility] [place], Coralie Keens, NP   5 months ago Hoarseness of voice   [facility] [place], Devonne Doughty, DO   6 months ago Type 2 diabetes mellitus with other specified complication, with long-term current use of insulin ([place])   [facility], Devonne Doughty, DO               pantoprazole ([place]) 40 MG tablet [Pharmacy Med Name: Pantoprazole Sodium 40 MG Oral Tablet Delayed Release] 90 tablet 3    Sig: TAKE 1 TABLET BY MOUTH DAILY     Gastroenterology: Proton Pump Inhibitors Passed - 11/19/2021  2:19 PM      Passed - Valid encounter within last 12 months    Recent Outpatient Visits           3 weeks ago Type 2 diabetes mellitus with other specified complication, without long-term current use of insulin ([place])   [facility] [place], Grayland Ormond A, RPH-CPP   1 month ago Acute non-recurrent frontal sinusitis   [facility] [place], Devonne Doughty, DO   1 month ago Acute bacterial sinusitis   [facility] [place], Coralie Keens, NP   5 months ago Hoarseness of voice   [facility] [place], Devonne Doughty, DO   6 months ago Type 2 diabetes mellitus with other specified complication, with long-term current use of insulin ([place])   [facility], Devonne Doughty, DO               buPROPion (WELLBUTRIN XL) 150 MG 24 hr tablet [Pharmacy Med Name: buPROPion HCl ER (XL) 150 MG Oral Tablet Extended Release 24 Hour] 90 tablet 3    Sig: TAKE 1 TABLET BY MOUTH DAILY     Psychiatry: Antidepressants - bupropion Failed - 11/19/2021  2:19 PM      Failed  - AST in normal range and within 360 days    AST  Date Value Ref Range Status  08/18/2017 22 15 - 41 U/L Final   SGOT(AST)  Date Value Ref Range Status  09/22/2013 17 15 - 37 Unit/L Final         Failed - ALT in normal range and within 360 days    ALT  Date Value Ref Range Status  08/18/2017 16 0 - 44 U/L Final   SGPT (ALT)  Date Value Ref Range Status  09/22/2013 16 U/L Final    Comment:    14-63 NOTE: New Reference Range 08/02/13  Passed - Cr in normal range and within 360 days    Creat  Date Value Ref Range Status  05/15/2021 0.62 0.50 - 1.03 mg/dL Final         Passed - Completed PHQ-2 or PHQ-9 in the last 360 days      Passed - Last BP in normal range    BP Readings from Last 1 Encounters:  10/18/21 119/77         Passed - Valid encounter within last 6 months    Recent Outpatient Visits           3 weeks ago Type 2 diabetes mellitus with other specified complication, without long-term current use of insulin ([place])   [facility] [place], Grayland Ormond A, RPH-CPP   1 month ago Acute non-recurrent frontal sinusitis   [facility] [place], Devonne Doughty, DO   1 month ago Acute bacterial sinusitis   [facility] [place], Coralie Keens, NP   5 months ago Hoarseness of voice   [facility] [place], Devonne Doughty, DO   6 months ago Type 2 diabetes mellitus with other specified complication, with long-term current use of insulin ([place])   [facility] [place], Devonne Doughty, DO               clopidogrel (PLAVIX) 75 MG tablet [Pharmacy Med Name: Clopidogrel Bisulfate 75 MG Oral Tablet] 100 tablet 2    Sig: TAKE 1 TABLET BY MOUTH DAILY     Hematology: Antiplatelets - clopidogrel Failed - 11/19/2021  2:19 PM      Failed - HCT in normal range and within 180 days    HCT  Date Value Ref Range Status  08/18/2017 45.0 36.0 - 46.0 % Final  09/22/2013 44.3 35.0 - 47.0 % Final          Failed - HGB in normal range and within 180 days    Hemoglobin  Date Value Ref Range Status  08/18/2017 15.3 (H) 12.0 - 15.0 g/dL Final   HGB  Date Value Ref Range Status  09/22/2013 14.6 12.0 - 16.0 g/dL Final         Failed - PLT in normal range and within 180 days    Platelets  Date Value Ref Range Status  08/18/2017 316 150 - 400 K/uL Final   Platelet  Date Value Ref Range Status  09/22/2013 300 150 - 440 x10 3/mm 3 Final         Passed - Cr in normal range and within 360 days    Creat  Date Value Ref Range Status  05/15/2021 0.62 0.50 - 1.03 mg/dL Final         Passed - Valid encounter within last 6 months    Recent Outpatient Visits           3 weeks ago Type 2 diabetes mellitus with other specified complication, without long-term current use of insulin ([place])   [place], Grayland Ormond A, RPH-CPP   1 month ago Acute non-recurrent frontal sinusitis   [facility] Olin Hauser, DO   1 month ago Acute bacterial sinusitis   [facility] [place], Coralie Keens, NP   5 months ago Hoarseness of voice   [facility] Olin Hauser, DO   6 months ago Type 2 diabetes mellitus with other specified complication, with long-term current use of insulin ([place])   [facility],  Devonne Doughty, DO              Signed Prescriptions Disp Refills   atorvastatin (LIPITOR) 40 MG tablet 90 tablet 0    Sig: TAKE 1 TABLET BY MOUTH AT  BEDTIME     Cardiovascular:  Antilipid - Statins Failed - 11/19/2021  2:19 PM      Failed - Lipid Panel in normal range within the last 12 months    Cholesterol  Date Value Ref Range Status  04/07/2017 136 0 - 200 mg/dL Final  10/07/2012 207 (H) 0 - 200 mg/dL Final   Ldl Cholesterol, Calc  Date Value Ref Range Status  10/07/2012 127 (H) 0 - 100 mg/dL Final   LDL Cholesterol  Date Value Ref Range Status  04/07/2017 67 0 - 99 mg/dL Final     Comment:           Total Cholesterol/HDL:CHD Risk Coronary Heart Disease Risk Table                     Men   Women  1/2 Average Risk   3.4   3.3  Average Risk       5.0   4.4  2 X Average Risk   9.6   7.1  3 X Average Risk  23.4   11.0        Use the calculated Patient Ratio above and the CHD Risk Table to determine the patient's CHD Risk.        ATP III CLASSIFICATION (LDL):  <100     mg/dL   Optimal  100-129  mg/dL   Near or Above                    Optimal  130-159  mg/dL   Borderline  160-189  mg/dL   High  >190     mg/dL   Very High Performed at [place] [address], [place], [place] [address]    HDL Cholesterol  Date Value Ref Range Status  10/07/2012 25 (L) 40 - 60 mg/dL Final   HDL  Date Value Ref Range Status  04/07/2017 31 (L) >40 mg/dL Final   Triglycerides  Date Value Ref Range Status  04/07/2017 191 (H) <150 mg/dL Final  10/07/2012 273 (H) 0 - 200 mg/dL Final         Passed - Patient is not pregnant      Passed - Valid encounter within last 12 months    Recent Outpatient Visits           3 weeks ago Type 2 diabetes mellitus with other specified complication, without long-term current use of insulin ([place])   [facility], Grayland Ormond A, RPH-CPP   1 month ago Acute non-recurrent frontal sinusitis   [facility] [place], Devonne Doughty, DO   1 month ago Acute bacterial sinusitis   [facility] [place], Coralie Keens, NP   5 months ago Hoarseness of voice   [facility] [place], Devonne Doughty, DO   6 months ago Type 2 diabetes mellitus with other specified complication, with long-term current use of insulin [facility])   [place], Devonne Doughty, DO

## 2021-11-20 ENCOUNTER — Ambulatory Visit (HOSPITAL_COMMUNITY)
Admission: RE | Admit: 2021-11-20 | Discharge: 2021-11-20 | Disposition: A | Discharge status: Home / Self Care | Payer: Medicare Other | Source: Ambulatory Visit | Attending: Vascular Surgery | Admitting: Vascular Surgery

## 2021-11-20 ENCOUNTER — Ambulatory Visit (INDEPENDENT_AMBULATORY_CARE_PROVIDER_SITE_OTHER): Payer: Medicare Other | Admitting: Vascular Surgery

## 2021-11-20 ENCOUNTER — Encounter: Payer: Self-pay | Admitting: Vascular Surgery

## 2021-11-20 VITALS — BP 118/74 | HR 81 | Temp 97.9°F | Resp 20 | Ht 66.0 in | Wt 235.0 lb

## 2021-11-20 DIAGNOSIS — I739 Peripheral vascular disease, unspecified: Secondary | ICD-10-CM

## 2021-11-20 NOTE — Progress Notes (Signed)
Patient ID: [name], female   DOB: 1965/05/02, 56 y.o.   MRN: [id number]  Reason for Consult: Follow-up   Referred by [name] *  Subjective:     HPI:  [name] is a 56 y.o. female with a history of diabetes uncontrolled as well as hyperlipidemia and hypertension.  She does have COPD and has limitations to walking secondary to a stroke and uses a motorized wheelchair.  She was recently evaluated for bilateral lower extremity pain and now follows up and the pain has worsened.  She states that for the past 2 weeks she has not been able to stand up because her pain is so great and her legs are very weak.  She has not really had any evaluation of this problem.  She continues to smoke 1 pack/day.  Past Medical History:  Diagnosis Date   Anxiety    Cancer ([place])    a spot on liver and treated    Complication of anesthesia    restless,easily upset   COPD (chronic obstructive pulmonary disease) (HCC)    Depression    Diabetes mellitus without complication (HCC)    Diverticulitis    Fatty liver    GERD (gastroesophageal reflux disease)    Headache(784.0)    migraines   History of kidney stones    Hyperlipidemia    Hypertension    Pneumonia    Restless    Stroke ([place])    Family History  Problem Relation Age of Onset   Diabetes Mother    Hypertension Mother    Hypertension Father    Past Surgical History:  Procedure Laterality Date   ABDOMINAL HYSTERECTOMY     ACHILLES TENDON LENGTHENING Right 08/18/2017   Procedure: ACHILLES TENDON LENGTHENING;  Surgeon: Altamese Moncure, MD;  Location: [place];  Service: Orthopedics;  Laterality: Right;   ANTERIOR CERVICAL DECOMP/DISCECTOMY FUSION N/A 03/11/2012   Procedure: ANTERIOR CERVICAL DECOMPRESSION/DISCECTOMY FUSION 1 LEVEL;  Surgeon: Ophelia Charter, MD;  Location: [facility];  Service: Neurosurgery;  Laterality: N/A;  Cervical five-six anterior cervical decompression with fusion interbody prothesis plating and  bonegraft   BACK SURGERY     EXTERNAL FIXATION LEG Right 08/10/2017   Procedure: EXTERNAL FIXATION ANKLE;  Surgeon: Dereck Leep, MD;  Location: [facility];  Service: Orthopedics;  Laterality: Right;   EXTERNAL FIXATION REMOVAL Right 08/18/2017   Procedure: REMOVAL EXTERNAL FIXATION LEG;  Surgeon: Altamese Danbury, MD;  Location: [facility];  Service: Orthopedics;  Laterality: Right;   EYE SURGERY     HERNIA REPAIR     JOINT REPLACEMENT     bil knees   ORIF ANKLE FRACTURE Right 08/18/2017   Procedure: OPEN REDUCTION INTERNAL FIXATION (ORIF) ANKLE FRACTURE;  Surgeon: Altamese Chino, MD;  Location: [place];  Service: Orthopedics;  Laterality: Right;   REPLACEMENT TOTAL KNEE BILATERAL      Short Social History:  Social History   Tobacco Use   Smoking status: Every Day    Packs/day: 1.00    Years: 20.00    Total pack years: 20.00    Types: Cigarettes   Smokeless tobacco: Never  Substance Use Topics   Alcohol use: No    Allergies  Allergen Reactions   Tramadol Nausea And Vomiting and Hypertension   Augmentin [Amoxicillin-Pot Clavulanate] Rash    Current Outpatient Medications  Medication Sig Dispense Refill   albuterol (VENTOLIN HFA) 108 (90 Base) MCG/ACT inhaler USE 2 INHALATIONS BY MOUTH EVERY 4 HOURS AS NEEDED FOR WHEEZING  OR SHORTNESS OF BREATH 26.8 g 4   atorvastatin (LIPITOR) 40 MG tablet TAKE 1 TABLET BY MOUTH AT  BEDTIME 90 tablet 0   azelastine (ASTELIN) 0.1 % nasal spray Place 2 sprays into both nostrils 2 (two) times daily. 30 mL 12   Buprenorphine HCl-Naloxone HCl 8-2 MG FILM Place under the tongue 3 (three) times daily.     buPROPion (WELLBUTRIN XL) 150 MG 24 hr tablet TAKE 1 TABLET BY MOUTH DAILY 100 tablet 3   citalopram (CELEXA) 40 MG tablet TAKE 1 TABLET BY MOUTH DAILY 100 tablet 3   clopidogrel (PLAVIX) 75 MG tablet TAKE 1 TABLET BY MOUTH DAILY 100 tablet 2   dicyclomine (BENTYL) 20 MG tablet Take 1 tablet (20 mg total) by mouth 4 (four) times daily -  before meals and at  bedtime. As needed for abdominal pain cramping 60 tablet 2   fluconazole (DIFLUCAN) 150 MG tablet Take one tablet by mouth on Day 1. Repeat dose 2nd tablet on Day 3. 2 tablet 0   fluticasone (FLONASE) 50 MCG/ACT nasal spray USE 2 SPRAYS IN BOTH NOSTRILS  DAILY USE FOR 4 TO 6 WEEKS, THEN STOP AND USE SEASONALLY OR AS  NEEDED 48 g 0   gabapentin (NEURONTIN) 600 MG tablet TAKE 1 TABLET(600 MG) BY MOUTH FOUR TIMES DAILY 360 tablet 3   meclizine (ANTIVERT) 25 MG tablet Take 1 tablet (25 mg total) by mouth 3 (three) times daily as needed for dizziness. 30 tablet 0   metFORMIN (GLUCOPHAGE) 1000 MG tablet TAKE 1 TABLET(1000 MG) BY MOUTH TWICE DAILY WITH A MEAL 180 tablet 0   MOUNJARO 5 MG/0.5ML Pen INJECT '5MG'$  UNDER THE SKIN ONCE A WEEK 2 mL 2   NYSTATIN powder APPLY TOPICALLY A SMALL AMOUNT TWICE DAILY AS NEEDED 60 g 2   ondansetron (ZOFRAN-ODT) 4 MG disintegrating tablet Take 1 tablet (4 mg total) by mouth every 8 (eight) hours as needed for nausea or vomiting. 30 tablet 2   pantoprazole (PROTONIX) 40 MG tablet TAKE 1 TABLET BY MOUTH DAILY 100 tablet 3   pioglitazone (ACTOS) 45 MG tablet Take 45 mg by mouth daily.     QUEtiapine (SEROQUEL) 100 MG tablet TAKE 1 TABLET BY MOUTH AT  BEDTIME 100 tablet 3   RELISTOR 150 MG TABS Take 3 tablets by mouth every morning.     topiramate (TOPAMAX) 100 MG tablet TAKE 1 TABLET BY MOUTH TWICE  DAILY 200 tablet 3   TRELEGY ELLIPTA 100-62.5-25 MCG/ACT AEPB INHALE 1 PUFF INTO THE LUNGS DAILY 60 each 11   triamcinolone cream (KENALOG) 0.1 % Apply 1 application. topically 2 (two) times daily. 30 g 2   Vitamin D, Ergocalciferol, (DRISDOL) 50000 units CAPS capsule Take 50,000 Units by mouth every 7 (seven) days. On Wednesday     No current facility-administered medications for this visit.    Review of Systems  Constitutional:  Constitutional negative. HENT: HENT negative.  Eyes: Eyes negative.  Cardiovascular: Positive for claudication.  Musculoskeletal: Positive for  back pain, leg pain and joint pain.  Neurological: Positive for focal weakness.  Hematologic: Hematologic/lymphatic negative.  Psychiatric: Psychiatric negative.        Objective:  Objective   Vitals:   11/20/21 1346  BP: 118/74  Pulse: 81  Resp: 20  Temp: 97.9 F (36.6 C)  SpO2: 94%     Physical Exam Constitutional:      Appearance: She is obese.  HENT:     Head: Normocephalic.     Mouth/Throat:  Mouth: Mucous membranes are moist.  Eyes:     Pupils: Pupils are equal, round, and reactive to light.  Cardiovascular:     Rate and Rhythm: Normal rate.     Pulses:          Popliteal pulses are 0 on the right side and 0 on the left side.       Dorsalis pedis pulses are detected w/ Doppler on the right side and detected w/ Doppler on the left side.       Posterior tibial pulses are detected w/ Doppler on the right side and detected w/ Doppler on the left side.     Comments: I cannot feel femoral pulses given patient's body habitus and that she is seated in a chair and unable to stand or be lifted to the bed Pulmonary:     Effort: Pulmonary effort is normal.  Abdominal:     General: Abdomen is flat.     Palpations: Abdomen is soft. There is no mass.  Musculoskeletal:     Cervical back: Normal range of motion and neck supple.  Skin:    General: Skin is warm and dry.     Capillary Refill: Capillary refill takes less than 2 seconds.  Neurological:     General: No focal deficit present.     Mental Status: She is alert.  Psychiatric:        Mood and Affect: Mood normal.        Behavior: Behavior normal.        Thought Content: Thought content normal.        Judgment: Judgment normal.     Data: ABI Findings:  +---------+------------------+-----+--------+--------+  Right   Rt Pressure (mmHg)IndexWaveformComment   +---------+------------------+-----+--------+--------+  Great Toe80                     Absent             +---------+------------------+-----+--------+--------+   +---------+------------------+-----+--------+-------+  Left    Lt Pressure (mmHg)IndexWaveformComment  +---------+------------------+-----+--------+-------+  Great Toe76                     Abnormal         +---------+------------------+-----+--------+-------+   ABIs were unable to be performed as patient could not stand      Assessment/Plan:    56 year old female previously seen for lower extremity arterial disease with concern for claudication now with severe leg pain and weakness and cannot even stand.  This is not consistent with ischemia given that her feet are warm, her toe pressures are preserved and she has strong dorsalis pedis and posterior tibial signals by Doppler.  I have discussed with her the need for urgent smoking cessation and to discuss with her primary care provider her recent change in lower extremity weakness which seems to be more likely neurologic given the time course and severity.  I will have her follow-up in 6 months with repeat ABIs and hopefully at that time she has some strength back in her legs and has severely cut back smoking.  She demonstrates good understanding.     Waynetta Sandy MD [facility]

## 2021-11-21 ENCOUNTER — Telehealth: Payer: Self-pay

## 2021-11-21 NOTE — Telephone Encounter (Signed)
Copied from [place]. Topic: General - Other >> Nov 20, 2021  3:05 PM Ja-Kwan M wrote: Reason for CRM: Pt stated she can not use her legs so she can not come in for an appt. Pt requested a virtual appt with Dr. Raliegh Ip but there were no virtual appts available. Pt requests call back because she needs to discuss getting a referral. Cb# [phone]

## 2021-11-21 NOTE — Telephone Encounter (Signed)
Copied from [place]. Topic: General - Other >> Nov 20, 2021  3:05 PM Ja-Kwan M wrote: Reason for CRM: Pt stated she can not use her legs so she can not come in for an appt. Pt requested a virtual appt with Dr. Raliegh Ip but there were no virtual appts available. Pt requests call back because she needs to discuss getting a referral. Cb# [phone]  I tried calling the number provided back but no answer and no option to leave a message.

## 2021-11-25 DIAGNOSIS — Z79891 Long term (current) use of opiate analgesic: Secondary | ICD-10-CM | POA: Diagnosis not present

## 2021-11-25 DIAGNOSIS — I69351 Hemiplegia and hemiparesis following cerebral infarction affecting right dominant side: Secondary | ICD-10-CM | POA: Diagnosis not present

## 2021-11-25 DIAGNOSIS — M25561 Pain in right knee: Secondary | ICD-10-CM | POA: Diagnosis not present

## 2021-11-25 DIAGNOSIS — M545 Low back pain, unspecified: Secondary | ICD-10-CM | POA: Diagnosis not present

## 2021-11-25 DIAGNOSIS — G8929 Other chronic pain: Secondary | ICD-10-CM | POA: Diagnosis not present

## 2021-11-25 DIAGNOSIS — M961 Postlaminectomy syndrome, not elsewhere classified: Secondary | ICD-10-CM | POA: Diagnosis not present

## 2021-11-25 DIAGNOSIS — F17210 Nicotine dependence, cigarettes, uncomplicated: Secondary | ICD-10-CM | POA: Diagnosis not present

## 2021-11-26 ENCOUNTER — Other Ambulatory Visit: Payer: Self-pay

## 2021-11-26 DIAGNOSIS — I739 Peripheral vascular disease, unspecified: Secondary | ICD-10-CM

## 2021-11-27 ENCOUNTER — Ambulatory Visit: Payer: Medicare Other | Admitting: Pharmacist

## 2021-11-27 ENCOUNTER — Other Ambulatory Visit: Payer: Self-pay | Admitting: Family Medicine

## 2021-11-27 DIAGNOSIS — Z720 Tobacco use: Secondary | ICD-10-CM

## 2021-11-27 DIAGNOSIS — B379 Candidiasis, unspecified: Secondary | ICD-10-CM

## 2021-11-27 DIAGNOSIS — E1169 Type 2 diabetes mellitus with other specified complication: Secondary | ICD-10-CM

## 2021-11-27 NOTE — Chronic Care Management (AMB) (Signed)
11/27/2021 Name: [name] MRN: [id number] DOB: March 13, 1965  Chief Complaint  Patient presents with   Medication Management    [name] is a 56 y.o. year old female who presented for a telephone visit.   They were referred to the pharmacist by their PCP for assistance in managing diabetes and complex medication management.   Telephone with patient today is brief as patient reports her biggest concern is contacting office to schedule an appointment with PCP regarding her leg pain.  - From review of chart, note patient contacted office on 11/8 requesting a Virtual appointment with PCP and requesting a call back. Office attempted to reach patient on 11/9 - Encouraged patient to return call to office today  Subjective:  Care Team: Primary Care Provider: Olin Hauser, DO  [facility] [place]; Next Scheduled Visit: 11/20/2021 Pain Management: [employer] in [place]  Medication Access/Adherence  Current Pharmacy:  [facility] [place] [facility] [address] [address] [address] Phone: [phone] Fax: [phone]  [facility], [address] [place] [address] Phone: [phone] Fax: [phone]   Patient reports affordability concerns with their medications: No  Patient reports access/transportation concerns to their pharmacy: No  Patient reports adherence concerns with their medications:  No    Diabetes:   Current medications:  Metformin 1000 mg twice daily Mounjaro 5 mg weekly on Wednesdays  Today patient reports she self-discontinued her pioglitazone due to pill burden   Medications tried in the past: Tradjenta, Tresiba   Current glucose readings: Reports recent morning fasting readings ranging 160-180    Current physical activity: previously reported limited by right side deficit  due to stroke and previous injury to right ankle from a fall; now reports further limited by current trouble with her legs   Tobacco Abuse: Currently smoking ~ 1 pack/day Motivation: quality of life, playing with grandkids, desire to be eligible for endovascular treatment   Objective: Lab Results  Component Value Date   HGBA1C 9.7 (H) 05/15/2021    Lab Results  Component Value Date   CREATININE 0.62 05/15/2021   BUN 13 05/15/2021   NA 139 05/15/2021   K 4.9 05/15/2021   CL 102 05/15/2021   CO2 29 05/15/2021    Lab Results  Component Value Date   CHOL 136 04/07/2017   HDL 31 (L) 04/07/2017   LDLCALC 67 04/07/2017   TRIG 191 (H) 04/07/2017   CHOLHDL 4.4 04/07/2017    Medications Reviewed Today     Reviewed by Leota Jacobsen, LPN (Licensed Practical Nurse) on 11/20/21 at [place] List Status: <None>   Medication Order Taking? Sig Documenting Provider Last Dose Status Informant  albuterol (VENTOLIN HFA) 108 (90 Base) MCG/ACT inhaler [id number] Yes USE 2 INHALATIONS BY MOUTH EVERY 4 HOURS AS NEEDED FOR WHEEZING  OR SHORTNESS OF BREATH Karamalegos, Devonne Doughty, DO Taking Active   atorvastatin (LIPITOR) 40 MG tablet [id number] Yes TAKE 1 TABLET BY MOUTH AT  BEDTIME Olin Hauser, DO Taking Active   azelastine (ASTELIN) 0.1 % nasal spray [id number] Yes Place 2 sprays into both nostrils 2 (two) times daily. Olin Hauser, DO Taking Active   Buprenorphine HCl-Naloxone HCl 8-2 MG FILM [id number] Yes Place under the tongue 3 (three) times daily. [provider] Taking Active   buPROPion (  WELLBUTRIN XL) 150 MG 24 hr tablet [id number] Yes TAKE 1 TABLET BY MOUTH DAILY Olin Hauser, DO Taking Active   citalopram (CELEXA) 40 MG tablet [id number] Yes TAKE 1 TABLET BY MOUTH DAILY Olin Hauser, DO Taking Active   clopidogrel (PLAVIX) 75 MG tablet [id number] Yes TAKE 1 TABLET BY MOUTH DAILY Karamalegos, Devonne Doughty, DO Taking Active    dicyclomine (BENTYL) 20 MG tablet [id number] Yes Take 1 tablet (20 mg total) by mouth 4 (four) times daily -  before meals and at bedtime. As needed for abdominal pain cramping Olin Hauser, DO Taking Active   fluconazole (DIFLUCAN) 150 MG tablet [id number] Yes Take one tablet by mouth on Day 1. Repeat dose 2nd tablet on Day 3. Olin Hauser, DO Taking Active   fluticasone (FLONASE) 50 MCG/ACT nasal spray [id number] Yes USE 2 SPRAYS IN BOTH NOSTRILS  DAILY USE FOR 4 TO 6 WEEKS, THEN STOP AND USE SEASONALLY OR AS  NEEDED [place], Devonne Doughty, DO Taking Active   gabapentin (NEURONTIN) 600 MG tablet [id number] Yes TAKE 1 TABLET(600 MG) BY MOUTH FOUR TIMES DAILY Karamalegos, Devonne Doughty, DO Taking Active   meclizine (ANTIVERT) 25 MG tablet [id number] Yes Take 1 tablet (25 mg total) by mouth 3 (three) times daily as needed for dizziness. Jearld Fenton, NP Taking Active   metFORMIN (GLUCOPHAGE) 1000 MG tablet [id number] Yes TAKE 1 TABLET(1000 MG) BY MOUTH TWICE DAILY WITH A MEAL Olin Hauser, DO Taking Active   MOUNJARO 5 MG/0.5ML Pen [id number] Yes INJECT '5MG'$  UNDER THE SKIN ONCE A WEEK Olin Hauser, DO Taking Active   NYSTATIN powder [id number] Yes APPLY TOPICALLY A SMALL AMOUNT TWICE DAILY AS NEEDED [place], Devonne Doughty, DO Taking Active   ondansetron (ZOFRAN-ODT) 4 MG disintegrating tablet [id number] Yes Take 1 tablet (4 mg total) by mouth every 8 (eight) hours as needed for nausea or vomiting. Olin Hauser, DO Taking Active   pantoprazole (PROTONIX) 40 MG tablet [id number] Yes TAKE 1 TABLET BY MOUTH DAILY Karamalegos, Devonne Doughty, DO Taking Active   pioglitazone (ACTOS) 45 MG tablet [id number] Yes Take 45 mg by mouth daily. [provider] Taking Active Pharmacy Records  QUEtiapine (SEROQUEL) 100 MG tablet [id number] Yes TAKE 1 TABLET BY MOUTH AT  BEDTIME Olin Hauser, DO Taking Active   RELISTOR 150 MG TABS [id number] Yes Take 3  tablets by mouth every morning. [provider] Taking Active   topiramate (TOPAMAX) 100 MG tablet [id number] Yes TAKE 1 TABLET BY MOUTH TWICE  DAILY Olin Hauser, DO Taking Active   TRELEGY ELLIPTA 100-62.5-25 MCG/ACT AEPB [id number] Yes INHALE 1 PUFF INTO THE LUNGS DAILY Olin Hauser, DO Taking Active   triamcinolone cream (KENALOG) 0.1 % [id number] Yes Apply 1 application. topically 2 (two) times daily. Olin Hauser, DO Taking Active   Vitamin D, Ergocalciferol, (DRISDOL) 50000 units CAPS capsule [id number] Yes Take 50,000 Units by mouth every 7 (seven) days. On Wednesday [provider] Taking Active               Assessment/Plan:    Encourage patient to follow up with office today regarding current leg pain/weakness - Will send message to PCP  Diabetes: - Currently uncontrolled - Have reviewed long term cardiovascular and renal outcomes of uncontrolled blood sugar - Have encouraged to have regular well-balanced meals throughout the day, while controlling carbohydrate portion sizes - Have recommended to check glucose, keep log of results and have this record to review at  future appointment     Tobacco Abuse - Currently uncontrolled - Have provided information on 1 [place] support program     Follow Up Plan: Clinical Pharmacist will follow up with patient by telephone on 12/30/2021 at 1 pm   Wallace Cullens, PharmD, [place], [facility] [phone]

## 2021-11-27 NOTE — Patient Instructions (Signed)
Goals Addressed             This Visit's Progress    Pharmacy Goals       Our goal A1c is less than 7%. This corresponds with fasting sugars less than 130 and 2 hour after meal sugars less than 180. Please check your blood sugar, keep a log of the results and have this for [place] to review during our next telephone appointment  Our goal bad cholesterol, or LDL, is less than 70 . This is why it is important to continue taking your atorvastatin  Please consider calling the [place] Quitline for their help with quitting smoking.  The [place] [employer]uitline phone number is: [phone]  Feel free to call me with any questions or concerns. I look forward to our next call!   Wallace Cullens, PharmD, [place], CPP Clinical Pharmacist [facility] [phone]

## 2021-11-28 ENCOUNTER — Ambulatory Visit: Payer: Medicare Other | Admitting: Family Medicine

## 2021-11-28 ENCOUNTER — Ambulatory Visit: Payer: Self-pay

## 2021-11-28 NOTE — Telephone Encounter (Signed)
Requested medication (s) are due for refill today - provider review  Requested medication (s) are on the active medication list -yes  Future visit scheduled -no  Last refill: 10/25/21 #2  Notes to clinic: medication not assigned protocol- requires review   Requested Prescriptions  Pending Prescriptions Disp Refills   fluconazole (DIFLUCAN) 150 MG tablet [Pharmacy Med Name: FLUCONAZOLE '150MG'$  TABLETS] 2 tablet 0    Sig: TAKE 1 TABLET BY MOUTH FOR 1 DAY. REPEAT DOSE 2 ND TABLET ON DAY 3     Off-Protocol Failed - 11/27/2021  8:14 PM      Failed - Medication not assigned to a protocol, review manually.      Passed - Valid encounter within last 12 months    Recent Outpatient Visits           Yesterday Type 2 diabetes mellitus with other specified complication, without long-term current use of insulin ([place])   [place], Grayland Ormond A, RPH-CPP   1 month ago Type 2 diabetes mellitus with other specified complication, without long-term current use of insulin ([place])   [facility] [place], Grayland Ormond A, RPH-CPP   1 month ago Acute non-recurrent frontal sinusitis   [facility] [place], Devonne Doughty, DO   1 month ago Acute bacterial sinusitis   [facility] [place], Coralie Keens, NP   6 months ago Hoarseness of voice   [place], DO                 Requested Prescriptions  Pending Prescriptions Disp Refills   fluconazole (DIFLUCAN) 150 MG tablet [Pharmacy Med Name: FLUCONAZOLE '150MG'$  TABLETS] 2 tablet 0    Sig: TAKE 1 TABLET BY MOUTH FOR 1 DAY. REPEAT DOSE 2 ND TABLET ON DAY 3     Off-Protocol Failed - 11/27/2021  8:14 PM      Failed - Medication not assigned to a protocol, review manually.      Passed - Valid encounter within last 12 months    Recent Outpatient Visits           Yesterday Type 2 diabetes mellitus with other specified complication, without long-term  current use of insulin ([place])   [place], Grayland Ormond A, RPH-CPP   1 month ago Type 2 diabetes mellitus with other specified complication, without long-term current use of insulin ([place])   [place], Grayland Ormond A, RPH-CPP   1 month ago Acute non-recurrent frontal sinusitis   [place], DO   1 month ago Acute bacterial sinusitis   [facility] [place], Coralie Keens, NP   6 months ago Hoarseness of voice   [place], Devonne Doughty, [place]

## 2021-11-28 NOTE — Telephone Encounter (Signed)
  Chief Complaint: mouth sores Symptoms: mouth sores all over mouth on inside, painful  Frequency: since Sunday Pertinent Negatives: NA Disposition: '[]'$ ED /'[]'$ Urgent Care (no appt availability in office) / '[x]'$ Appointment(In office/virtual)/ '[]'$  [place] [facility]/ '[]'$ Home Care/ '[]'$ Refused Recommended Disposition /'[]'$ [place] [facility]/ '[]'$  Follow-up with PCP Additional Notes: pt states she has yeast infection in mouth again from DM medication or maybe another medication but this is happening frquently. Last episode was 3 weeks ago. Pt states she requested refill on Diflucan but hadn't heard back yet. Advised it was sent to Dr. Raliegh Ip this morning. Scheduled pt for VV tomorrow at 1120 since Dr. Raliegh Ip had no appts available. Pt states that she is unable come in d/t leg pain and inability to walk. Advised pt I would send message back about refill. No further assistance needed.   Summary: Possible yeast infection in her mouth   Pt called reporting that she believes she is having a reaction to her trellegy, she says she has what feels like a yeast infection in her mouth  Best contact: [phone]         Reason for Disposition  [1] MILD-MODERATE mouth pain AND [2] present > 3 days  Answer Assessment - Initial Assessment Questions 1. SYMPTOM: "What's the main symptom you're concerned about?" (e.g., chapped lips, dry mouth, lump, sores)     Tiny small sores all over mouth  2. ONSET: "When did the  sx  start?"     Several days now  3. PAIN: "Is there any pain?" If Yes, ask: "How bad is it?" (Scale: 1-10; mild, moderate, severe)   - MILD (1-3):  doesn't interfere with eating or normal activities   - MODERATE (4-7): interferes with eating some solids and normal activities   - SEVERE (8-10):  excruciating pain, interferes with most normal activities   - SEVERE DYSPHAGIA: can't swallow liquids, drooling     Moderate  4. CAUSE: "What do you think is causing the symptoms?"     Thinks yeast infection from  DM medication  5. OTHER SYMPTOMS: "Do you have any other symptoms?" (e.g., fever, sore throat, toothache, swelling)     Mouth very painful  Protocols used: Mouth Symptoms-A-AH, Mouth Pain-A-AH

## 2021-11-29 ENCOUNTER — Telehealth (INDEPENDENT_AMBULATORY_CARE_PROVIDER_SITE_OTHER): Payer: Medicare Other | Admitting: Internal Medicine

## 2021-11-29 ENCOUNTER — Encounter: Payer: Self-pay | Admitting: Internal Medicine

## 2021-11-29 DIAGNOSIS — B370 Candidal stomatitis: Secondary | ICD-10-CM | POA: Diagnosis not present

## 2021-11-29 NOTE — Patient Instructions (Signed)

## 2021-11-29 NOTE — Progress Notes (Signed)
Virtual Visit via Video Note  I connected with [name] on 11/29/21 at 11:20 AM EST by a video enabled telemedicine application and verified that I am speaking with the correct person using two identifiers.  Location: Patient: Home Provider: Office  Persons participating in this video call: Webb Silversmith, NP and Noralee Chars   I discussed the limitations of evaluation and management by telemedicine and the availability of in person appointments. The patient expressed understanding and agreed to proceed.  History of Present Illness:  Patient reports white coating on tongue, difficulty eating.  She noticed this a few days ago.  She reports she frequently gets oral thrush secondary to use of her Trelegy inhaler.  She reports her PCP called her in [place] yesterday.   Past Medical History:  Diagnosis Date   Anxiety    Cancer ([place])    a spot on liver and treated    Complication of anesthesia    restless,easily upset   COPD (chronic obstructive pulmonary disease) (HCC)    Depression    Diabetes mellitus without complication (HCC)    Diverticulitis    Fatty liver    GERD (gastroesophageal reflux disease)    Headache(784.0)    migraines   History of kidney stones    Hyperlipidemia    Hypertension    Pneumonia    Restless    Stroke [facility])     Current Outpatient Medications  Medication Sig Dispense Refill   albuterol (VENTOLIN HFA) 108 (90 Base) MCG/ACT inhaler USE 2 INHALATIONS BY MOUTH EVERY 4 HOURS AS NEEDED FOR WHEEZING  OR SHORTNESS OF BREATH 26.8 g 4   atorvastatin (LIPITOR) 40 MG tablet TAKE 1 TABLET BY MOUTH AT  BEDTIME 90 tablet 0   azelastine (ASTELIN) 0.1 % nasal spray Place 2 sprays into both nostrils 2 (two) times daily. 30 mL 12   Buprenorphine HCl-Naloxone HCl 8-2 MG FILM Place under the tongue 3 (three) times daily.     buPROPion (WELLBUTRIN XL) 150 MG 24 hr tablet TAKE 1 TABLET BY MOUTH DAILY 100 tablet 3   citalopram (CELEXA) 40 MG tablet TAKE 1 TABLET BY  MOUTH DAILY 100 tablet 3   clopidogrel (PLAVIX) 75 MG tablet TAKE 1 TABLET BY MOUTH DAILY 100 tablet 2   dicyclomine (BENTYL) 20 MG tablet Take 1 tablet (20 mg total) by mouth 4 (four) times daily -  before meals and at bedtime. As needed for abdominal pain cramping 60 tablet 2   fluconazole (DIFLUCAN) 150 MG tablet TAKE 1 TABLET BY MOUTH FOR 1 DAY. REPEAT DOSE 2 ND TABLET ON DAY 3 2 tablet 0   fluticasone (FLONASE) 50 MCG/ACT nasal spray USE 2 SPRAYS IN BOTH NOSTRILS  DAILY USE FOR 4 TO 6 WEEKS, THEN STOP AND USE SEASONALLY OR AS  NEEDED 48 g 0   gabapentin (NEURONTIN) 600 MG tablet TAKE 1 TABLET(600 MG) BY MOUTH FOUR TIMES DAILY 360 tablet 3   meclizine (ANTIVERT) 25 MG tablet Take 1 tablet (25 mg total) by mouth 3 (three) times daily as needed for dizziness. 30 tablet 0   metFORMIN (GLUCOPHAGE) 1000 MG tablet TAKE 1 TABLET(1000 MG) BY MOUTH TWICE DAILY WITH A MEAL 180 tablet 0   MOUNJARO 5 MG/0.5ML Pen INJECT '5MG'$  UNDER THE SKIN ONCE A WEEK 2 mL 2   NYSTATIN powder APPLY TOPICALLY A SMALL AMOUNT TWICE DAILY AS NEEDED 60 g 2   ondansetron (ZOFRAN-ODT) 4 MG disintegrating tablet Take 1 tablet (4 mg total) by mouth every 8 (  eight) hours as needed for nausea or vomiting. 30 tablet 2   pantoprazole (PROTONIX) 40 MG tablet TAKE 1 TABLET BY MOUTH DAILY 100 tablet 3   pioglitazone (ACTOS) 45 MG tablet Take 45 mg by mouth daily.     QUEtiapine (SEROQUEL) 100 MG tablet TAKE 1 TABLET BY MOUTH AT  BEDTIME 100 tablet 3   RELISTOR 150 MG TABS Take 3 tablets by mouth every morning.     topiramate (TOPAMAX) 100 MG tablet TAKE 1 TABLET BY MOUTH TWICE  DAILY 200 tablet 3   TRELEGY ELLIPTA 100-62.5-25 MCG/ACT AEPB INHALE 1 PUFF INTO THE LUNGS DAILY 60 each 11   triamcinolone cream (KENALOG) 0.1 % Apply 1 application. topically 2 (two) times daily. 30 g 2   Vitamin D, Ergocalciferol, (DRISDOL) 50000 units CAPS capsule Take 50,000 Units by mouth every 7 (seven) days. On Wednesday     No current  facility-administered medications for this visit.    Allergies  Allergen Reactions   Tramadol Nausea And Vomiting and Hypertension   Augmentin [Amoxicillin-Pot Clavulanate] Rash    Family History  Problem Relation Age of Onset   Diabetes Mother    Hypertension Mother    Hypertension Father     Social History   Socioeconomic History   Marital status: Married    Spouse name: Not on file   Number of children: Not on file   Years of education: Not on file   Highest education level: Not on file  Occupational History   Not on file  Tobacco Use   Smoking status: Every Day    Packs/day: 1.00    Years: 20.00    Total pack years: 20.00    Types: Cigarettes   Smokeless tobacco: Never  Vaping Use   Vaping Use: Former  Substance and Sexual Activity   Alcohol use: No   Drug use: Yes    Types: Marijuana    Comment: last smoked 2 days ago  8/4   Sexual activity: Not on file  Other Topics Concern   Not on file  Social History Narrative   Not on file   Social Determinants of Health   Financial Resource Strain: Medium Risk (03/04/2021)   Overall Financial Resource Strain (CARDIA)    Difficulty of Paying Living Expenses: Somewhat hard  Food Insecurity: No Food Insecurity (03/04/2021)   Hunger Vital Sign    Worried About Running Out of Food in the Last Year: Never true    Ran Out of Food in the Last Year: Never true  Transportation Needs: No Transportation Needs (03/04/2021)   PRAPARE - Hydrologist (Medical): No    Lack of Transportation (Non-Medical): No  Physical Activity: Not on file  Stress: Stress Concern Present (03/04/2021)   [place]    Feeling of Stress : To some extent  Social Connections: Moderately Isolated (03/04/2021)   Social Connection and Isolation Panel [NHANES]    Frequency of Communication with Friends and Family: More than three times a week    Frequency of  Social Gatherings with Friends and Family: More than three times a week    Attends Religious Services: Never    Marine scientist or Organizations: No    Attends Archivist Meetings: Never    Marital Status: Married  Human resources officer Violence: Not on file     Constitutional: Denies fever, malaise, fatigue, headache or abrupt weight changes.  HEENT: Reports white coating  on tongue.  Denies eye pain, eye redness, ear pain, ringing in the ears, wax buildup, runny nose, nasal congestion, bloody nose, or sore throat. Respiratory: Denies difficulty breathing, shortness of breath, cough or sputum production.   Cardiovascular: Denies chest pain, chest tightness, palpitations or swelling in the hands or feet.  Gastrointestinal: Pt reports difficulty eating. Denies abdominal pain, bloating, constipation, diarrhea or blood in the stool.   No other specific complaints in a complete review of systems (except as listed in HPI above).   Observations/Objective:   Wt Readings from Last 3 Encounters:  11/20/21 235 lb (106.6 kg)  10/18/21 233 lb (105.7 kg)  07/24/21 233 lb (105.7 kg)    General: Appears her stated age, obese, chronically ill appearing, in NAD. HEENT: Throat/Mouth: White coating noted on tongue Pulmonary/Chest: Normal effort. No respiratory distress.  Neurological: Alert and oriented.   BMET    Component Value Date/Time   NA 139 05/15/2021 1351   NA 135 (L) 09/22/2013 2251   K 4.9 05/15/2021 1351   K 3.8 09/22/2013 2251   CL 102 05/15/2021 1351   CL 100 09/22/2013 2251   CO2 29 05/15/2021 1351   CO2 27 09/22/2013 2251   GLUCOSE 261 (H) 05/15/2021 1351   GLUCOSE 335 (H) 09/22/2013 2251   BUN 13 05/15/2021 1351   BUN 11 09/22/2013 2251   CREATININE 0.62 05/15/2021 1351   CALCIUM 8.6 05/15/2021 1351   CALCIUM 8.3 (L) 09/22/2013 2251   GFRNONAA >60 08/18/2017 0707   GFRNONAA >60 09/22/2013 2251   GFRAA >60 08/18/2017 0707   GFRAA >60 09/22/2013 2251     Lipid Panel     Component Value Date/Time   CHOL 136 04/07/2017 0828   CHOL 207 (H) 10/07/2012 0423   TRIG 191 (H) 04/07/2017 0828   TRIG 273 (H) 10/07/2012 0423   HDL 31 (L) 04/07/2017 0828   HDL 25 (L) 10/07/2012 0423   CHOLHDL 4.4 04/07/2017 0828   VLDL 38 04/07/2017 0828   VLDL 55 (H) 10/07/2012 0423   LDLCALC 67 04/07/2017 0828   LDLCALC 127 (H) 10/07/2012 0423    CBC    Component Value Date/Time   WBC 9.5 08/18/2017 0707   RBC 4.94 08/18/2017 0707   HGB 15.3 (H) 08/18/2017 1319   HGB 14.6 09/22/2013 2251   HCT 45.0 08/18/2017 1319   HCT 44.3 09/22/2013 2251   PLT 316 08/18/2017 0707   PLT 300 09/22/2013 2251   MCV 89.5 08/18/2017 0707   MCV 90 09/22/2013 2251   MCH 28.5 08/18/2017 0707   MCHC 31.9 08/18/2017 0707   RDW 13.8 08/18/2017 0707   RDW 13.8 09/22/2013 2251   LYMPHSABS 2.1 08/18/2017 0707   LYMPHSABS 4.5 (H) 08/02/2013 2118   MONOABS 0.6 08/18/2017 0707   MONOABS 1.3 (H) 08/02/2013 2118   EOSABS 0.2 08/18/2017 0707   EOSABS 0.1 08/02/2013 2118   BASOSABS 0.1 08/18/2017 0707   BASOSABS 0.0 08/02/2013 2118    Hgb A1C Lab Results  Component Value Date   HGBA1C 9.7 (H) 05/15/2021       Assessment and Plan:  Oral Thrush:  Has already been treated with Diflucan per PCP She declines Rx for nystatin swish and spit at this time Encouraged her her mouth out after using her inhaler  Follow-up with your PCP as previously scheduled  Follow Up Instructions:    I discussed the assessment and treatment plan with the patient. The patient was provided an opportunity to ask questions and all  were answered. The patient agreed with the plan and demonstrated an understanding of the instructions.   The patient was advised to call back or seek an in-person evaluation if the symptoms worsen or if the condition fails to improve as anticipated.    Webb Silversmith, NP

## 2021-12-08 ENCOUNTER — Inpatient Hospital Stay
Admission: EM | Admit: 2021-12-08 | Discharge: 2021-12-14 | DRG: 071 | Disposition: A | Discharge status: Home / Self Care | Payer: Medicare Other | Attending: Osteopathic Medicine | Admitting: Osteopathic Medicine

## 2021-12-08 ENCOUNTER — Emergency Department: Payer: Medicare Other

## 2021-12-08 DIAGNOSIS — I700 Atherosclerosis of aorta: Secondary | ICD-10-CM | POA: Diagnosis not present

## 2021-12-08 DIAGNOSIS — G459 Transient cerebral ischemic attack, unspecified: Secondary | ICD-10-CM

## 2021-12-08 DIAGNOSIS — Z720 Tobacco use: Secondary | ICD-10-CM

## 2021-12-08 DIAGNOSIS — Z79891 Long term (current) use of opiate analgesic: Secondary | ICD-10-CM

## 2021-12-08 DIAGNOSIS — M19011 Primary osteoarthritis, right shoulder: Secondary | ICD-10-CM | POA: Diagnosis not present

## 2021-12-08 DIAGNOSIS — G4734 Idiopathic sleep related nonobstructive alveolar hypoventilation: Secondary | ICD-10-CM

## 2021-12-08 DIAGNOSIS — E8729 Other acidosis: Secondary | ICD-10-CM | POA: Diagnosis not present

## 2021-12-08 DIAGNOSIS — F32A Depression, unspecified: Secondary | ICD-10-CM | POA: Diagnosis not present

## 2021-12-08 DIAGNOSIS — N390 Urinary tract infection, site not specified: Secondary | ICD-10-CM | POA: Diagnosis not present

## 2021-12-08 DIAGNOSIS — G894 Chronic pain syndrome: Secondary | ICD-10-CM | POA: Diagnosis not present

## 2021-12-08 DIAGNOSIS — F17210 Nicotine dependence, cigarettes, uncomplicated: Secondary | ICD-10-CM | POA: Diagnosis present

## 2021-12-08 DIAGNOSIS — R404 Transient alteration of awareness: Secondary | ICD-10-CM | POA: Diagnosis not present

## 2021-12-08 DIAGNOSIS — Z7951 Long term (current) use of inhaled steroids: Secondary | ICD-10-CM

## 2021-12-08 DIAGNOSIS — E669 Obesity, unspecified: Secondary | ICD-10-CM | POA: Insufficient documentation

## 2021-12-08 DIAGNOSIS — S82853S Displaced trimalleolar fracture of unspecified lower leg, sequela: Secondary | ICD-10-CM

## 2021-12-08 DIAGNOSIS — G43909 Migraine, unspecified, not intractable, without status migrainosus: Secondary | ICD-10-CM | POA: Diagnosis not present

## 2021-12-08 DIAGNOSIS — Z1152 Encounter for screening for COVID-19: Secondary | ICD-10-CM | POA: Diagnosis not present

## 2021-12-08 DIAGNOSIS — Z539 Procedure and treatment not carried out, unspecified reason: Secondary | ICD-10-CM | POA: Diagnosis present

## 2021-12-08 DIAGNOSIS — J449 Chronic obstructive pulmonary disease, unspecified: Secondary | ICD-10-CM | POA: Diagnosis not present

## 2021-12-08 DIAGNOSIS — I10 Essential (primary) hypertension: Secondary | ICD-10-CM | POA: Diagnosis not present

## 2021-12-08 DIAGNOSIS — Z79899 Other long term (current) drug therapy: Secondary | ICD-10-CM

## 2021-12-08 DIAGNOSIS — Z6841 Body Mass Index (BMI) 40.0 and over, adult: Secondary | ICD-10-CM

## 2021-12-08 DIAGNOSIS — G43109 Migraine with aura, not intractable, without status migrainosus: Secondary | ICD-10-CM

## 2021-12-08 DIAGNOSIS — I6381 Other cerebral infarction due to occlusion or stenosis of small artery: Secondary | ICD-10-CM | POA: Diagnosis not present

## 2021-12-08 DIAGNOSIS — I69951 Hemiplegia and hemiparesis following unspecified cerebrovascular disease affecting right dominant side: Secondary | ICD-10-CM

## 2021-12-08 DIAGNOSIS — Z881 Allergy status to other antibiotic agents status: Secondary | ICD-10-CM

## 2021-12-08 DIAGNOSIS — R918 Other nonspecific abnormal finding of lung field: Secondary | ICD-10-CM | POA: Diagnosis not present

## 2021-12-08 DIAGNOSIS — R4182 Altered mental status, unspecified: Secondary | ICD-10-CM | POA: Diagnosis present

## 2021-12-08 DIAGNOSIS — F419 Anxiety disorder, unspecified: Secondary | ICD-10-CM | POA: Diagnosis present

## 2021-12-08 DIAGNOSIS — R0902 Hypoxemia: Secondary | ICD-10-CM | POA: Diagnosis not present

## 2021-12-08 DIAGNOSIS — E872 Acidosis, unspecified: Secondary | ICD-10-CM | POA: Diagnosis not present

## 2021-12-08 DIAGNOSIS — H9209 Otalgia, unspecified ear: Secondary | ICD-10-CM | POA: Diagnosis not present

## 2021-12-08 DIAGNOSIS — R000 Tachycardia, unspecified: Secondary | ICD-10-CM | POA: Diagnosis not present

## 2021-12-08 DIAGNOSIS — R109 Unspecified abdominal pain: Secondary | ICD-10-CM | POA: Diagnosis not present

## 2021-12-08 DIAGNOSIS — B9620 Unspecified Escherichia coli [E. coli] as the cause of diseases classified elsewhere: Secondary | ICD-10-CM | POA: Diagnosis present

## 2021-12-08 DIAGNOSIS — E876 Hypokalemia: Secondary | ICD-10-CM | POA: Diagnosis not present

## 2021-12-08 DIAGNOSIS — Z885 Allergy status to narcotic agent status: Secondary | ICD-10-CM | POA: Diagnosis not present

## 2021-12-08 DIAGNOSIS — G43711 Chronic migraine without aura, intractable, with status migrainosus: Secondary | ICD-10-CM

## 2021-12-08 DIAGNOSIS — J9611 Chronic respiratory failure with hypoxia: Secondary | ICD-10-CM | POA: Diagnosis present

## 2021-12-08 DIAGNOSIS — R531 Weakness: Secondary | ICD-10-CM | POA: Diagnosis not present

## 2021-12-08 DIAGNOSIS — F331 Major depressive disorder, recurrent, moderate: Secondary | ICD-10-CM

## 2021-12-08 DIAGNOSIS — E119 Type 2 diabetes mellitus without complications: Secondary | ICD-10-CM | POA: Diagnosis present

## 2021-12-08 DIAGNOSIS — Z7985 Long-term (current) use of injectable non-insulin antidiabetic drugs: Secondary | ICD-10-CM

## 2021-12-08 DIAGNOSIS — J432 Centrilobular emphysema: Secondary | ICD-10-CM

## 2021-12-08 DIAGNOSIS — I6930 Unspecified sequelae of cerebral infarction: Secondary | ICD-10-CM

## 2021-12-08 DIAGNOSIS — Z7902 Long term (current) use of antithrombotics/antiplatelets: Secondary | ICD-10-CM

## 2021-12-08 DIAGNOSIS — E1169 Type 2 diabetes mellitus with other specified complication: Secondary | ICD-10-CM

## 2021-12-08 DIAGNOSIS — R32 Unspecified urinary incontinence: Secondary | ICD-10-CM

## 2021-12-08 DIAGNOSIS — Z96653 Presence of artificial knee joint, bilateral: Secondary | ICD-10-CM | POA: Diagnosis present

## 2021-12-08 DIAGNOSIS — K760 Fatty (change of) liver, not elsewhere classified: Secondary | ICD-10-CM | POA: Diagnosis present

## 2021-12-08 DIAGNOSIS — G9341 Metabolic encephalopathy: Secondary | ICD-10-CM | POA: Diagnosis not present

## 2021-12-08 DIAGNOSIS — Z8249 Family history of ischemic heart disease and other diseases of the circulatory system: Secondary | ICD-10-CM

## 2021-12-08 DIAGNOSIS — E785 Hyperlipidemia, unspecified: Secondary | ICD-10-CM | POA: Diagnosis not present

## 2021-12-08 DIAGNOSIS — E66813 Obesity, class 3: Secondary | ICD-10-CM

## 2021-12-08 DIAGNOSIS — Z743 Need for continuous supervision: Secondary | ICD-10-CM | POA: Diagnosis not present

## 2021-12-08 DIAGNOSIS — Z7984 Long term (current) use of oral hypoglycemic drugs: Secondary | ICD-10-CM

## 2021-12-08 DIAGNOSIS — M19012 Primary osteoarthritis, left shoulder: Secondary | ICD-10-CM | POA: Diagnosis not present

## 2021-12-08 DIAGNOSIS — Z90710 Acquired absence of both cervix and uterus: Secondary | ICD-10-CM

## 2021-12-08 DIAGNOSIS — Z993 Dependence on wheelchair: Secondary | ICD-10-CM

## 2021-12-08 DIAGNOSIS — R410 Disorientation, unspecified: Secondary | ICD-10-CM | POA: Diagnosis not present

## 2021-12-08 DIAGNOSIS — E559 Vitamin D deficiency, unspecified: Secondary | ICD-10-CM

## 2021-12-08 DIAGNOSIS — Z87442 Personal history of urinary calculi: Secondary | ICD-10-CM

## 2021-12-08 DIAGNOSIS — Z833 Family history of diabetes mellitus: Secondary | ICD-10-CM

## 2021-12-08 LAB — CBC WITH DIFFERENTIAL/PLATELET
Abs Immature Granulocytes: 0.07 10*3/uL (ref 0.00–0.07)
Basophils Absolute: 0 10*3/uL (ref 0.0–0.1)
Basophils Relative: 0 %
Eosinophils Absolute: 0 10*3/uL (ref 0.0–0.5)
Eosinophils Relative: 0 %
HCT: 46.3 % — ABNORMAL HIGH (ref 36.0–46.0)
Hemoglobin: 15.1 g/dL — ABNORMAL HIGH (ref 12.0–15.0)
Immature Granulocytes: 1 %
Lymphocytes Relative: 17 %
Lymphs Abs: 2.2 10*3/uL (ref 0.7–4.0)
MCH: 26.5 pg (ref 26.0–34.0)
MCHC: 32.6 g/dL (ref 30.0–36.0)
MCV: 81.4 fL (ref 80.0–100.0)
Monocytes Absolute: 0.6 10*3/uL (ref 0.1–1.0)
Monocytes Relative: 5 %
Neutro Abs: 9.8 10*3/uL — ABNORMAL HIGH (ref 1.7–7.7)
Neutrophils Relative %: 77 %
Platelets: 383 10*3/uL (ref 150–400)
RBC: 5.69 MIL/uL — ABNORMAL HIGH (ref 3.87–5.11)
RDW: 13.9 % (ref 11.5–15.5)
WBC: 12.7 10*3/uL — ABNORMAL HIGH (ref 4.0–10.5)
nRBC: 0 % (ref 0.0–0.2)

## 2021-12-08 LAB — COMPREHENSIVE METABOLIC PANEL
ALT: 12 U/L (ref 0–44)
AST: 20 U/L (ref 15–41)
Albumin: 3.8 g/dL (ref 3.5–5.0)
Alkaline Phosphatase: 83 U/L (ref 38–126)
Anion gap: 16 — ABNORMAL HIGH (ref 5–15)
BUN: 19 mg/dL (ref 6–20)
CO2: 17 mmol/L — ABNORMAL LOW (ref 22–32)
Calcium: 9.3 mg/dL (ref 8.9–10.3)
Chloride: 100 mmol/L (ref 98–111)
Creatinine, Ser: 0.74 mg/dL (ref 0.44–1.00)
GFR, Estimated: >60 mL/min (ref >60)
Glucose, Bld: 150 mg/dL — ABNORMAL HIGH (ref 70–99)
Potassium: 3.7 mmol/L (ref 3.5–5.1)
Sodium: 133 mmol/L — ABNORMAL LOW (ref 135–145)
Total Bilirubin: 1.1 mg/dL (ref 0.3–1.2)
Total Protein: 8 g/dL (ref 6.5–8.1)

## 2021-12-08 LAB — LACTIC ACID, PLASMA: Lactic Acid, Venous: 2.1 mmol/L (ref 0.5–1.9)

## 2021-12-08 LAB — RESP PANEL BY RT-PCR (FLU A&B, COVID) ARPGX2
Influenza A by PCR: NEGATIVE
Influenza B by PCR: NEGATIVE
SARS Coronavirus 2 by RT PCR: NEGATIVE

## 2021-12-08 MED ORDER — IOHEXOL 350 MG/ML SOLN
100.0000 mL | Freq: Once | INTRAVENOUS | Status: AC | PRN
  Administered 2021-12-08: 100 mL via INTRAVENOUS

## 2021-12-08 MED ORDER — LACTATED RINGERS IV SOLN: INTRAVENOUS | Status: AC

## 2021-12-08 MED ORDER — LACTATED RINGERS IV BOLUS (SEPSIS)
1000.0000 mL | Freq: Once | INTRAVENOUS | Status: AC
  Administered 2021-12-08: 1000 mL via INTRAVENOUS

## 2021-12-08 NOTE — ED Provider Notes (Addendum)
[facility] Provider Note    Event Date/Time   First MD Initiated Contact with Patient 12/08/21 1916     (approximate)   History   No chief complaint on file.   HPI  [name] is a 56 y.o. female with a past medical history significant for COPD, depression, diabetes, prior CVA, who presents to the emergency department with altered mental status.  Patient's husband states that starting last night she had worsening altered mental status.  States that she had difficulty logging into bank accounts which she has used for years.  Today she had worsening altered mental status to the point that she did not recognize her husband.  Just moaning out and groaning today.  Multiple episodes of vomiting.  Patient unable to state her name or where she is.  No known history of seizures     Physical Exam     Most recent vital signs: Vitals:   12/08/21 2218 12/08/21 2220  BP: 115/79   Pulse: 98 94  Resp: 16 17  Temp:    SpO2: 99% 96%    Physical Exam Constitutional:      General: She is in acute distress.     Appearance: She is well-developed. She is ill-appearing.  HENT:     Head: Atraumatic.     Comments: Dry mucous membranes Eyes:     Conjunctiva/sclera: Conjunctivae normal.     Pupils: Pupils are equal, round, and reactive to light.  Cardiovascular:     Rate and Rhythm: Tachycardia present.     Heart sounds: No murmur heard. Pulmonary:     Effort: No respiratory distress.  Abdominal:     General: There is distension.     Tenderness: There is abdominal tenderness.     Comments: Palpable mass to her abdomen  Musculoskeletal:        General: Normal range of motion.     Cervical back: Normal range of motion.     Right lower leg: No edema.     Left lower leg: No edema.  Skin:    General: Skin is warm.  Neurological:     Mental Status: She is alert.     Comments: Disoriented, moaning out in pain, following simple commands.  Moving all  extremities.  Unable to state her name or where she is.          IMPRESSION / MDM / ASSESSMENT AND PLAN / ED COURSE  I reviewed the triage vital signs and the nursing notes.  Differential diagnosis including infectious process, delirium, intracranial hemorrhage, CVA, COVID, influenza, pneumonia, urinary tract infection, dehydration, metabolic derangement, intra-abdominal infectious process or hernia.    EKG  I, Nathaniel Man, the attending physician, personally viewed and interpreted this ECG.   Rate: Sinus tachycardia, rate 101  Rhythm: Sinus tach cardia  Axis: Normal  Intervals: Normal  ST&T Change: None  No tachycardic or bradycardic dysrhythmias while on cardiac telemetry.  Sinus tachycardia.  RADIOLOGY I independently reviewed imaging, my interpretation of imaging: Chest x-ray without focal findings consistent with pneumonia.  Chest x-ray is read as persistent perihilar interstitial opacities worse in the left lower lobe.  CT scan of the head showed no signs of intracranial hemorrhage.  CT abdomen and pelvis with contrast to evaluate for intra-abdominal pathology, no signs of a bowel obstruction  CT scan was read as no acute findings.    ED Results / Procedures / Treatments   Labs (all labs ordered are listed, but only  abnormal results are displayed) Labs interpreted as -   Lactic acid mildly elevated.  Mild leukocytosis. Labs Reviewed  LACTIC ACID, PLASMA - Abnormal; Notable for the following components:      Result Value   Lactic Acid, Venous 2.1 (*)    All other components within normal limits  COMPREHENSIVE METABOLIC PANEL - Abnormal; Notable for the following components:   Sodium 133 (*)    CO2 17 (*)    Glucose, Bld 150 (*)    Anion gap 16 (*)    All other components within normal limits  CBC WITH DIFFERENTIAL/PLATELET - Abnormal; Notable for the following components:   WBC 12.7 (*)    RBC 5.69 (*)    Hemoglobin 15.1 (*)    HCT 46.3 (*)    Neutro  Abs 9.8 (*)    All other components within normal limits  RESP PANEL BY RT-PCR (FLU A&B, COVID) ARPGX2  CULTURE, BLOOD (ROUTINE X 2)  CULTURE, BLOOD (ROUTINE X 2)  URINE CULTURE  URINALYSIS, COMPLETE (UACMP) WITH MICROSCOPIC  BLOOD GAS, VENOUS  PROTIME-INR  APTT  LACTIC ACID, PLASMA  POC URINE PREG, ED    Given abdominal tenderness and recent vomiting will obtain CT scan of abdomen and pelvis for further evaluation.  Patient will need admission for altered mental status.   PROCEDURES:  Critical Care performed: No  Procedures  Patient's presentation is most consistent with acute presentation with potential threat to life or bodily function.   MEDICATIONS ORDERED IN ED: Medications  lactated ringers infusion (has no administration in time range)  lactated ringers bolus 1,000 mL (1,000 mLs Intravenous New Bag/Given 12/08/21 2214)  iohexol (OMNIPAQUE) 350 MG/ML injection 100 mL (100 mLs Intravenous Contrast Given 12/08/21 2308)    FINAL CLINICAL IMPRESSION(S) / ED DIAGNOSES   Final diagnoses:  Altered mental status, unspecified altered mental status type     Rx / DC Orders   ED Discharge Orders     None        Note:  This document was prepared using Dragon voice recognition software and may include unintentional dictation errors.   Nathaniel Man, MD 12/08/21 3825    Nathaniel Man, MD 12/08/21 2333

## 2021-12-09 ENCOUNTER — Emergency Department: Payer: Medicare Other

## 2021-12-09 ENCOUNTER — Encounter: Payer: Self-pay | Admitting: Registered Nurse

## 2021-12-09 ENCOUNTER — Inpatient Hospital Stay: Payer: Medicare Other | Admitting: Radiology

## 2021-12-09 ENCOUNTER — Encounter: Payer: Self-pay | Admitting: Radiology

## 2021-12-09 DIAGNOSIS — E669 Obesity, unspecified: Secondary | ICD-10-CM | POA: Insufficient documentation

## 2021-12-09 DIAGNOSIS — I10 Essential (primary) hypertension: Secondary | ICD-10-CM | POA: Diagnosis present

## 2021-12-09 DIAGNOSIS — E876 Hypokalemia: Secondary | ICD-10-CM | POA: Diagnosis not present

## 2021-12-09 DIAGNOSIS — Z96653 Presence of artificial knee joint, bilateral: Secondary | ICD-10-CM | POA: Diagnosis present

## 2021-12-09 DIAGNOSIS — K760 Fatty (change of) liver, not elsewhere classified: Secondary | ICD-10-CM | POA: Diagnosis present

## 2021-12-09 DIAGNOSIS — J9611 Chronic respiratory failure with hypoxia: Secondary | ICD-10-CM | POA: Diagnosis present

## 2021-12-09 DIAGNOSIS — G894 Chronic pain syndrome: Secondary | ICD-10-CM | POA: Diagnosis present

## 2021-12-09 DIAGNOSIS — E8729 Other acidosis: Secondary | ICD-10-CM

## 2021-12-09 DIAGNOSIS — I69951 Hemiplegia and hemiparesis following unspecified cerebrovascular disease affecting right dominant side: Secondary | ICD-10-CM | POA: Diagnosis not present

## 2021-12-09 DIAGNOSIS — G9341 Metabolic encephalopathy: Secondary | ICD-10-CM

## 2021-12-09 DIAGNOSIS — F419 Anxiety disorder, unspecified: Secondary | ICD-10-CM | POA: Diagnosis present

## 2021-12-09 DIAGNOSIS — F32A Depression, unspecified: Secondary | ICD-10-CM | POA: Diagnosis present

## 2021-12-09 DIAGNOSIS — J449 Chronic obstructive pulmonary disease, unspecified: Secondary | ICD-10-CM | POA: Diagnosis present

## 2021-12-09 DIAGNOSIS — G43909 Migraine, unspecified, not intractable, without status migrainosus: Secondary | ICD-10-CM | POA: Diagnosis present

## 2021-12-09 DIAGNOSIS — B9620 Unspecified Escherichia coli [E. coli] as the cause of diseases classified elsewhere: Secondary | ICD-10-CM | POA: Diagnosis present

## 2021-12-09 DIAGNOSIS — E785 Hyperlipidemia, unspecified: Secondary | ICD-10-CM | POA: Diagnosis present

## 2021-12-09 DIAGNOSIS — H9209 Otalgia, unspecified ear: Secondary | ICD-10-CM | POA: Diagnosis present

## 2021-12-09 DIAGNOSIS — Z6841 Body Mass Index (BMI) 40.0 and over, adult: Secondary | ICD-10-CM | POA: Diagnosis not present

## 2021-12-09 DIAGNOSIS — E872 Acidosis, unspecified: Secondary | ICD-10-CM | POA: Diagnosis present

## 2021-12-09 DIAGNOSIS — R4182 Altered mental status, unspecified: Secondary | ICD-10-CM | POA: Diagnosis present

## 2021-12-09 DIAGNOSIS — N390 Urinary tract infection, site not specified: Secondary | ICD-10-CM | POA: Diagnosis present

## 2021-12-09 DIAGNOSIS — Z1152 Encounter for screening for COVID-19: Secondary | ICD-10-CM | POA: Diagnosis not present

## 2021-12-09 DIAGNOSIS — E119 Type 2 diabetes mellitus without complications: Secondary | ICD-10-CM | POA: Diagnosis present

## 2021-12-09 DIAGNOSIS — Z885 Allergy status to narcotic agent status: Secondary | ICD-10-CM | POA: Diagnosis not present

## 2021-12-09 DIAGNOSIS — Z881 Allergy status to other antibiotic agents status: Secondary | ICD-10-CM | POA: Diagnosis not present

## 2021-12-09 DIAGNOSIS — F17210 Nicotine dependence, cigarettes, uncomplicated: Secondary | ICD-10-CM | POA: Diagnosis present

## 2021-12-09 HISTORY — PX: IR FL GUIDED LOC OF NEEDLE/CATH TIP FOR SPINAL INJECTION RT: IMG2397

## 2021-12-09 HISTORY — PX: IR FL GUIDED LOC OF NEEDLE/CATH TIP FOR SPINAL INJECTION LT: IMG2396

## 2021-12-09 LAB — CSF CELL COUNT WITH DIFFERENTIAL
Eosinophils, CSF: 0 %
Eosinophils, CSF: 0 %
Lymphs, CSF: 15 %
Lymphs, CSF: 43 %
Monocyte-Macrophage-Spinal Fluid: 19 %
Monocyte-Macrophage-Spinal Fluid: 27 %
RBC Count, CSF: 1987 /mm3 — ABNORMAL HIGH (ref 0–3)
RBC Count, CSF: 2848 /mm3 — ABNORMAL HIGH (ref 0–3)
Segmented Neutrophils-CSF: 38 %
Segmented Neutrophils-CSF: 58 %
Tube #: 1
Tube #: 1
WBC, CSF: 126 /mm3 (ref 0–5)
WBC, CSF: 184 /mm3 (ref 0–5)

## 2021-12-09 LAB — BLOOD GAS, VENOUS
Acid-Base Excess: 0.5 mmol/L (ref 0.0–2.0)
Bicarbonate: 24.6 mmol/L (ref 20.0–28.0)
O2 Saturation: 59.6 %
Patient temperature: 37
pCO2, Ven: 37 mmHg — ABNORMAL LOW (ref 44–60)
pH, Ven: 7.43 (ref 7.25–7.43)
pO2, Ven: 37 mmHg (ref 32–45)

## 2021-12-09 LAB — COMPREHENSIVE METABOLIC PANEL
ALT: 9 U/L (ref 0–44)
AST: 15 U/L (ref 15–41)
Albumin: 3.3 g/dL — ABNORMAL LOW (ref 3.5–5.0)
Alkaline Phosphatase: 70 U/L (ref 38–126)
Anion gap: 13 (ref 5–15)
BUN: 18 mg/dL (ref 6–20)
CO2: 20 mmol/L — ABNORMAL LOW (ref 22–32)
Calcium: 8.9 mg/dL (ref 8.9–10.3)
Chloride: 101 mmol/L (ref 98–111)
Creatinine, Ser: 0.7 mg/dL (ref 0.44–1.00)
GFR, Estimated: >60 mL/min (ref >60)
Glucose, Bld: 152 mg/dL — ABNORMAL HIGH (ref 70–99)
Potassium: 3.2 mmol/L — ABNORMAL LOW (ref 3.5–5.1)
Sodium: 134 mmol/L — ABNORMAL LOW (ref 135–145)
Total Bilirubin: 0.9 mg/dL (ref 0.3–1.2)
Total Protein: 6.9 g/dL (ref 6.5–8.1)

## 2021-12-09 LAB — URINALYSIS, COMPLETE (UACMP) WITH MICROSCOPIC
Bilirubin Urine: NEGATIVE
Glucose, UA: NEGATIVE mg/dL
Hgb urine dipstick: NEGATIVE
Ketones, ur: 80 mg/dL — AB
Nitrite: NEGATIVE
Protein, ur: 30 mg/dL — AB
Specific Gravity, Urine: >1.046 — ABNORMAL HIGH (ref 1.005–1.030)
pH: 5 (ref 5.0–8.0)

## 2021-12-09 LAB — CBC
HCT: 41.4 % (ref 36.0–46.0)
Hemoglobin: 14 g/dL (ref 12.0–15.0)
MCH: 27.5 pg (ref 26.0–34.0)
MCHC: 33.8 g/dL (ref 30.0–36.0)
MCV: 81.3 fL (ref 80.0–100.0)
Platelets: 396 10*3/uL (ref 150–400)
RBC: 5.09 MIL/uL (ref 3.87–5.11)
RDW: 13.9 % (ref 11.5–15.5)
WBC: 14 10*3/uL — ABNORMAL HIGH (ref 4.0–10.5)
nRBC: 0 % (ref 0.0–0.2)

## 2021-12-09 LAB — MENINGITIS/ENCEPHALITIS PANEL (CSF)

## 2021-12-09 LAB — LACTIC ACID, PLASMA: Lactic Acid, Venous: 1.6 mmol/L (ref 0.5–1.9)

## 2021-12-09 LAB — URINE DRUG SCREEN, QUALITATIVE (ARMC ONLY)
Amphetamines, Ur Screen: NOT DETECTED
Barbiturates, Ur Screen: NOT DETECTED
Benzodiazepine, Ur Scrn: NOT DETECTED
Cannabinoid 50 Ng, Ur ~~LOC~~: POSITIVE — AB
Cocaine Metabolite,Ur ~~LOC~~: NOT DETECTED
MDMA (Ecstasy)Ur Screen: NOT DETECTED
Methadone Scn, Ur: NOT DETECTED
Opiate, Ur Screen: NOT DETECTED
Phencyclidine (PCP) Ur S: NOT DETECTED
Tricyclic, Ur Screen: POSITIVE — AB

## 2021-12-09 LAB — PROTIME-INR
INR: 1.3 — ABNORMAL HIGH (ref 0.8–1.2)
Prothrombin Time: 16.2 seconds — ABNORMAL HIGH (ref 11.4–15.2)

## 2021-12-09 LAB — CBG MONITORING, ED
Glucose-Capillary: 108 mg/dL — ABNORMAL HIGH (ref 70–99)
Glucose-Capillary: 118 mg/dL — ABNORMAL HIGH (ref 70–99)
Glucose-Capillary: 130 mg/dL — ABNORMAL HIGH (ref 70–99)
Glucose-Capillary: 137 mg/dL — ABNORMAL HIGH (ref 70–99)
Glucose-Capillary: 146 mg/dL — ABNORMAL HIGH (ref 70–99)
Glucose-Capillary: 163 mg/dL — ABNORMAL HIGH (ref 70–99)

## 2021-12-09 LAB — PROTEIN AND GLUCOSE, CSF
Glucose, CSF: 84 mg/dL — ABNORMAL HIGH (ref 40–70)
Total  Protein, CSF: 78 mg/dL — ABNORMAL HIGH (ref 15–45)

## 2021-12-09 LAB — APTT: aPTT: 30 seconds (ref 24–36)

## 2021-12-09 LAB — TSH: TSH: 0.921 u[IU]/mL (ref 0.350–4.500)

## 2021-12-09 LAB — PROCALCITONIN: Procalcitonin: <0.1 ng/mL

## 2021-12-09 MED ORDER — MIDAZOLAM HCL 2 MG/2ML IJ SOLN
INTRAMUSCULAR | Status: AC
  Filled 2021-12-09: qty 2

## 2021-12-09 MED ORDER — DEXTROSE 5 % IV SOLN
10.0000 mg/kg | Freq: Three times a day (TID) | INTRAVENOUS | Status: DC
  Filled 2021-12-09: qty 11.9

## 2021-12-09 MED ORDER — SODIUM CHLORIDE 0.9 % IV SOLN
2.0000 g | Freq: Two times a day (BID) | INTRAVENOUS | Status: DC
  Administered 2021-12-09 – 2021-12-10 (×3): 2 g via INTRAVENOUS
  Filled 2021-12-09 (×3): qty 20

## 2021-12-09 MED ORDER — MORPHINE SULFATE (PF) 2 MG/ML IV SOLN
2.0000 mg | INTRAVENOUS | Status: DC | PRN
  Administered 2021-12-09 – 2021-12-10 (×3): 2 mg via INTRAVENOUS
  Filled 2021-12-09 (×3): qty 1

## 2021-12-09 MED ORDER — DEXTROSE 5 % IV SOLN
10.0000 mg/kg | Freq: Three times a day (TID) | INTRAVENOUS | Status: DC
  Administered 2021-12-09: 595 mg via INTRAVENOUS
  Filled 2021-12-09 (×3): qty 11.9

## 2021-12-09 MED ORDER — ONDANSETRON HCL 4 MG PO TABS: 4.0000 mg | ORAL_TABLET | Freq: Four times a day (QID) | ORAL | Status: DC | PRN

## 2021-12-09 MED ORDER — IPRATROPIUM-ALBUTEROL 0.5-2.5 (3) MG/3ML IN SOLN: 3.0000 mL | RESPIRATORY_TRACT | Status: DC | PRN

## 2021-12-09 MED ORDER — VANCOMYCIN HCL IN DEXTROSE 1-5 GM/200ML-% IV SOLN: 1000.0000 mg | Freq: Two times a day (BID) | INTRAVENOUS | Status: DC

## 2021-12-09 MED ORDER — LIDOCAINE HCL (PF) 1 % IJ SOLN
INTRAMUSCULAR | Status: AC
  Administered 2021-12-09: 10 mL
  Filled 2021-12-09: qty 30

## 2021-12-09 MED ORDER — INSULIN ASPART 100 UNIT/ML IJ SOLN
0.0000 [IU] | INTRAMUSCULAR | Status: DC
  Administered 2021-12-09 – 2021-12-11 (×6): 3 [IU] via SUBCUTANEOUS
  Administered 2021-12-11 (×2): 4 [IU] via SUBCUTANEOUS
  Administered 2021-12-11: 3 [IU] via SUBCUTANEOUS
  Administered 2021-12-12: 7 [IU] via SUBCUTANEOUS
  Administered 2021-12-12: 4 [IU] via SUBCUTANEOUS
  Administered 2021-12-12 (×2): 3 [IU] via SUBCUTANEOUS
  Administered 2021-12-13 (×2): 4 [IU] via SUBCUTANEOUS
  Administered 2021-12-13 – 2021-12-14 (×2): 3 [IU] via SUBCUTANEOUS
  Administered 2021-12-14: 4 [IU] via SUBCUTANEOUS
  Filled 2021-12-09 (×19): qty 1

## 2021-12-09 MED ORDER — FENTANYL CITRATE (PF) 100 MCG/2ML IJ SOLN
INTRAMUSCULAR | Status: AC
  Filled 2021-12-09: qty 2

## 2021-12-09 MED ORDER — VANCOMYCIN HCL IN DEXTROSE 1-5 GM/200ML-% IV SOLN: 1000.0000 mg | Freq: Once | INTRAVENOUS | Status: DC

## 2021-12-09 MED ORDER — LORAZEPAM 2 MG/ML IJ SOLN
1.0000 mg | Freq: Once | INTRAMUSCULAR | Status: AC
  Administered 2021-12-09: 1 mg via INTRAVENOUS
  Filled 2021-12-09: qty 1

## 2021-12-09 MED ORDER — VANCOMYCIN HCL IN DEXTROSE 1-5 GM/200ML-% IV SOLN
1000.0000 mg | Freq: Once | INTRAVENOUS | Status: AC
  Administered 2021-12-09: 1000 mg via INTRAVENOUS
  Filled 2021-12-09: qty 200

## 2021-12-09 MED ORDER — LIDOCAINE HCL (PF) 1 % IJ SOLN: 5.0000 mL | Freq: Once | INTRAMUSCULAR | Status: DC

## 2021-12-09 MED ORDER — VANCOMYCIN HCL 1500 MG/300ML IV SOLN
1500.0000 mg | Freq: Once | INTRAVENOUS | Status: AC
  Administered 2021-12-09: 1500 mg via INTRAVENOUS
  Filled 2021-12-09: qty 300

## 2021-12-09 MED ORDER — ACETAMINOPHEN 325 MG PO TABS
650.0000 mg | ORAL_TABLET | Freq: Four times a day (QID) | ORAL | Status: DC | PRN
  Administered 2021-12-11 – 2021-12-14 (×5): 650 mg via ORAL
  Filled 2021-12-09 (×5): qty 2

## 2021-12-09 MED ORDER — DEXTROSE 5 % IV SOLN
800.0000 mg | Freq: Three times a day (TID) | INTRAVENOUS | Status: DC
  Administered 2021-12-09: 800 mg via INTRAVENOUS
  Filled 2021-12-09: qty 16

## 2021-12-09 MED ORDER — ACETAMINOPHEN 650 MG RE SUPP: 650.0000 mg | Freq: Four times a day (QID) | RECTAL | Status: DC | PRN

## 2021-12-09 MED ORDER — SODIUM CHLORIDE 0.9 % IV SOLN: INTRAVENOUS | Status: DC

## 2021-12-09 MED ORDER — SODIUM CHLORIDE 0.9 % IV SOLN: 2.0000 g | Freq: Once | INTRAVENOUS | Status: DC

## 2021-12-09 MED ORDER — FENTANYL CITRATE (PF) 100 MCG/2ML IJ SOLN
INTRAMUSCULAR | Status: AC | PRN
  Administered 2021-12-09: 50 ug via INTRAVENOUS

## 2021-12-09 MED ORDER — VANCOMYCIN HCL IN DEXTROSE 1-5 GM/200ML-% IV SOLN
1000.0000 mg | Freq: Two times a day (BID) | INTRAVENOUS | Status: DC
  Administered 2021-12-09 – 2021-12-10 (×2): 1000 mg via INTRAVENOUS
  Filled 2021-12-09 (×2): qty 200

## 2021-12-09 MED ORDER — ONDANSETRON HCL 4 MG/2ML IJ SOLN: 4.0000 mg | Freq: Four times a day (QID) | INTRAMUSCULAR | Status: DC | PRN

## 2021-12-09 MED ORDER — MIDAZOLAM HCL 2 MG/2ML IJ SOLN
INTRAMUSCULAR | Status: AC | PRN
  Administered 2021-12-09: 1 mg via INTRAVENOUS

## 2021-12-09 NOTE — Assessment & Plan Note (Addendum)
Etiology uncertain: Differential includes infectious, medication related, TIA CT head was nonacute, MRI pending VBG unremarkable Treat empirically for infection with broad-spectrum antibiotics Rocephin and vancomycin, IV hydration IR consult for LP in the a.m. Hold sedating meds We will keep n.p.o. tonight Fall and aspiration precautions and neurologic checks Follow TSH and procalcitonin

## 2021-12-09 NOTE — ED Notes (Signed)
Pt sitting up in bed. She remains altered, only oriented to self. Pt intermittently moaning but states she is not in pain.

## 2021-12-09 NOTE — Progress Notes (Signed)
Patient clinically stable post LP with sedation. At baseline, yelling, uncooperative, stating she did not want [place] to do procedure, however only oriented to name only, disoriented to all else. Vitals stable with multiple assists placed to table, given Fentanyl 50 mcg IV along with Versed 1 mg IV as well as local with Dr Denna Haggard present. Lp successfully done at that time. Back to stretcher for recovery with report given to [facility] RN post procedure/bedside/specials/2/

## 2021-12-09 NOTE — Assessment & Plan Note (Signed)
Anxiety and depression Chronic migraine Hold buprenorphine until back to baseline mentation Hold bupropion, Celexa, gabapentin and topiramate until in the a.m. and more alert

## 2021-12-09 NOTE — Progress Notes (Signed)
Seen and examined at the bedside.  She is still confused and unable to provide any history.  She keeps saying "I do not feel good" but she is unable to provide any specifics.  She later reported "pain all over".  She is alert and oriented to person.  Vital signs are stable.  Patient will have lumbar puncture today to rule out meningitis.  Called her husband at 4:35 PM today but there was no response.  Continue empiric antibiotics for now.

## 2021-12-09 NOTE — Assessment & Plan Note (Signed)
Not acutely exacerbated Continue home inhalers DuoNebs as needed 

## 2021-12-09 NOTE — Progress Notes (Signed)
Pharmacy Antibiotic Note  [name] is a 56 y.o. female admitted on 12/08/2021 with acute metabolic encephalopathy, possible meningitis.  Pharmacy has been consulted for Vanc, Acyclovir dosing.  Pt has PCN allergy (rash to Augmentin) - MD will hold off on ampicillin for now.   Plan: Acyclovir 595 mg (10 mg/kg IBW) IV Q8H ordered to start on 11/27 @ ~ 0230.  Vancomycin 1 gm IV X 1 ordered followed by Vanc 1500 mg IV X 1 to make total loading dose of 2500 mg.  Vancomycin 1 gm IV Q12H IV ordered to start on 11/27 @ ~ 1500.   Weight: 112.4 kg (247 lb 12.8 oz)  Temp (24hrs), Avg:97.9 F (36.6 C), Min:97.8 F (36.6 C), Max:97.9 F (36.6 C)  Recent Labs  Lab 12/08/21 1944 12/08/21 2119 12/08/21 2343  WBC 12.7*  --   --   CREATININE 0.74  --   --   LATICACIDVEN  --  2.1* 1.6    Estimated Creatinine Clearance: 97.4 mL/min (by C-G formula based on SCr of 0.74 mg/dL).    Allergies  Allergen Reactions   Tramadol Nausea And Vomiting and Hypertension   Augmentin [Amoxicillin-Pot Clavulanate] Rash    Antimicrobials this admission:   >>    >>   Dose adjustments this admission:   Microbiology results:  BCx:   UCx:    Sputum:   MRSA PCR:   Thank you for allowing pharmacy to be a part of this patient's care.  Ladavion Savitz D 12/09/2021 2:07 AM

## 2021-12-09 NOTE — Assessment & Plan Note (Signed)
Supportive care Continue Plavix and statin At baseline patient uses a motorized wheelchair

## 2021-12-09 NOTE — Assessment & Plan Note (Addendum)
Unclear etiology Likely ketosis of some sort,?  Starvation.  Doubt euglycemic DKA as patient not on SGLT2 Anion gap of 16 with bicarb of 17.  Ketones 80 Blood sugar 150, renal function WNL IV hydration and monitor

## 2021-12-09 NOTE — Progress Notes (Signed)
Pharmacy - Brief Note (Acyclovir dosing)  Acyclovir to be dosed by Adjusted body weight as TBW > 140% of IBW.   Plan - Adjust Acyclovir to '800mg'$  IV q8h per adjusted BW.  - follow renal function - Await LP and plan for antimicrobials based on CSF analysis and meningitis/encephalitis panel  Doreene Eland, PharmD, BCPS, BCIDP Work Cell: [phone] 12/09/2021 5:04 PM

## 2021-12-09 NOTE — H&P (Signed)
History and Physical    Patient: [name] WCH:[id number] DOB: 30-Apr-1965 DOA: 12/08/2021 DOS: the patient was seen and examined on 12/09/2021 PCP: Olin Hauser, DO  Patient coming from: Home  Chief Complaint: No chief complaint on file.   HPI: [name] is a 56 y.o. female with medical history significant for Hypertension, type 2 diabetes depression, prior stroke with right-sided deficits, non ambulant, dependent on a motorized wheelchair , class III obesity, COPD, chronic pain on buprenorphine film, depression on multiple psychoactive pills, nicotine dependence, ventral hernias s/p repair who presents to the ED with altered mental status.  Most of the history is provided by the husband at the bedside who states that patient has had progressive weakness over the past several weeks and saw vascular surgeon on 11/8 with complaints of increasing pain and weakness in her legs.  The etiology was felt to be more neurologic than vascular given that she had strong pulses when examined.  Her husband states that she was at her baseline state of health until 3 days ago when she stopped eating or drinking and then on the day of arrival, she started vomiting and moaning and groaning but was unable to indicate where was hurting patient.  She was also confused and had difficulty doing things that were customary for her like logging into bank accounts and at 1 point she did not recognize them..  She  has had no fever or chills, cough or shortness of breath and no diarrhea.  She has had no recent adjustments in her medication. ED course and data review: Afebrile, BP 130/100, pulse 101 and respirations 27 with O2 sat 98% on room air.  Labs with WBC 12,700 and lactic acid 1.6.  CMP notable for glucose 150 with anion gap of 16 and bicarb of 17.  VBG shows normal pH of 7.43 with PCO2 37.  COVID and flu negative.  UA with ketones but otherwise unremarkable and UDS positive for cannabinoids and  tricyclic's. EKG, personally viewed and interpreted showed sinus tachycardia at 101 with no acute ischemic ST-T wave changes. Imaging: CT head was nonacute and showed generalized cerebral atrophy, microvascular disease and chronic left basal ganglia lacunar infarcts Chest x-ray showed persistent coarse perihilar interstitial opacities most marked in left lower lung. CT abdomen and pelvis showed sigmoid diverticulosis, stable ventral hernias anterior abdominal wall and postoperative changes levels L4 and L5 MRI brain ordered and hospitalist consulted for admission.   Review of Systems: Difficult due to confusion  Past Medical History:  Diagnosis Date   Anxiety    Cancer ([place])    a spot on liver and treated    Complication of anesthesia    restless,easily upset   COPD (chronic obstructive pulmonary disease) ([place])    Depression    Diabetes mellitus without complication (HCC)    Diverticulitis    Fatty liver    GERD (gastroesophageal reflux disease)    Headache(784.0)    migraines   History of kidney stones    Hyperlipidemia    Hypertension    Pneumonia    Restless    Stroke [facility]    Past Surgical History:  Procedure Laterality Date   ABDOMINAL HYSTERECTOMY     ACHILLES TENDON LENGTHENING Right 08/18/2017   Procedure: ACHILLES TENDON LENGTHENING;  Surgeon: Altamese Tall Timber, MD;  Location: [place];  Service: Orthopedics;  Laterality: Right;   ANTERIOR CERVICAL DECOMP/DISCECTOMY FUSION N/A 03/11/2012   Procedure: ANTERIOR CERVICAL DECOMPRESSION/DISCECTOMY FUSION 1 LEVEL;  Surgeon: Leonie Douglas  Arnoldo Morale, MD;  Location: [place] [facility];  Service: Neurosurgery;  Laterality: N/A;  Cervical five-six anterior cervical decompression with fusion interbody prothesis plating and bonegraft   BACK SURGERY     EXTERNAL FIXATION LEG Right 08/10/2017   Procedure: EXTERNAL FIXATION ANKLE;  Surgeon: Dereck Leep, MD;  Location: [facility];  Service: Orthopedics;  Laterality: Right;   EXTERNAL FIXATION REMOVAL  Right 08/18/2017   Procedure: REMOVAL EXTERNAL FIXATION LEG;  Surgeon: Altamese Eolia, MD;  Location: [place];  Service: Orthopedics;  Laterality: Right;   EYE SURGERY     HERNIA REPAIR     JOINT REPLACEMENT     bil knees   ORIF ANKLE FRACTURE Right 08/18/2017   Procedure: OPEN REDUCTION INTERNAL FIXATION (ORIF) ANKLE FRACTURE;  Surgeon: Altamese Avis, MD;  Location: [place];  Service: Orthopedics;  Laterality: Right;   REPLACEMENT TOTAL KNEE BILATERAL     Social History:  reports that she has been smoking cigarettes. She has a 20.00 pack-year smoking history. She has never used smokeless tobacco. She reports current drug use. Drug: Marijuana. She reports that she does not drink alcohol.  Allergies  Allergen Reactions   Tramadol Nausea And Vomiting and Hypertension   Augmentin [Amoxicillin-Pot Clavulanate] Rash    Family History  Problem Relation Age of Onset   Diabetes Mother    Hypertension Mother    Hypertension Father     Prior to Admission medications   Medication Sig Start Date End Date Taking? Authorizing Provider  albuterol (VENTOLIN HFA) 108 (90 Base) MCG/ACT inhaler USE 2 INHALATIONS BY MOUTH EVERY 4 HOURS AS NEEDED FOR WHEEZING  OR SHORTNESS OF BREATH 07/11/21   Karamalegos, Devonne Doughty, DO  atorvastatin (LIPITOR) 40 MG tablet TAKE 1 TABLET BY MOUTH AT  BEDTIME 11/19/21   Karamalegos, Alexander J, DO  azelastine (ASTELIN) 0.1 % nasal spray Place 2 sprays into both nostrils 2 (two) times daily. 10/25/21   Karamalegos, Devonne Doughty, DO  Buprenorphine HCl-Naloxone HCl 8-2 MG FILM Place under the tongue 3 (three) times daily. 06/03/21   [provider]  buPROPion (WELLBUTRIN XL) 150 MG 24 hr tablet TAKE 1 TABLET BY MOUTH DAILY 11/19/21   Parks Ranger, Devonne Doughty, DO  citalopram (CELEXA) 40 MG tablet TAKE 1 TABLET BY MOUTH DAILY 11/19/21   Parks Ranger, Devonne Doughty, DO  clopidogrel (PLAVIX) 75 MG tablet TAKE 1 TABLET BY MOUTH DAILY 11/19/21   Karamalegos, Devonne Doughty, DO   dicyclomine (BENTYL) 20 MG tablet Take 1 tablet (20 mg total) by mouth 4 (four) times daily -  before meals and at bedtime. As needed for abdominal pain cramping 03/04/21   Parks Ranger, Devonne Doughty, DO  fluconazole (DIFLUCAN) 150 MG tablet TAKE 1 TABLET BY MOUTH FOR 1 DAY. REPEAT DOSE 2 ND TABLET ON DAY 3 11/28/21   Karamalegos, Alexander J, DO  fluticasone (FLONASE) 50 MCG/ACT nasal spray USE 2 SPRAYS IN BOTH NOSTRILS  DAILY USE FOR 4 TO 6 WEEKS, THEN STOP AND USE SEASONALLY OR AS  NEEDED 11/19/21   Parks Ranger, Devonne Doughty, DO  gabapentin (NEURONTIN) 600 MG tablet TAKE 1 TABLET(600 MG) BY MOUTH FOUR TIMES DAILY 11/12/21   Parks Ranger, Devonne Doughty, DO  meclizine (ANTIVERT) 25 MG tablet Take 1 tablet (25 mg total) by mouth 3 (three) times daily as needed for dizziness. 10/10/21   Jearld Fenton, NP  metFORMIN (GLUCOPHAGE) 1000 MG tablet TAKE 1 TABLET(1000 MG) BY MOUTH TWICE DAILY WITH A MEAL 09/23/21   Karamalegos, Devonne Doughty, DO  MOUNJARO 5 MG/0.5ML Pen  INJECT '5MG'$  UNDER THE SKIN ONCE A WEEK 11/14/21   Karamalegos, Devonne Doughty, DO  NYSTATIN powder APPLY TOPICALLY A SMALL AMOUNT TWICE DAILY AS NEEDED 11/22/20   Parks Ranger, Devonne Doughty, DO  ondansetron (ZOFRAN-ODT) 4 MG disintegrating tablet Take 1 tablet (4 mg total) by mouth every 8 (eight) hours as needed for nausea or vomiting. 10/18/21   Karamalegos, Devonne Doughty, DO  pantoprazole (PROTONIX) 40 MG tablet TAKE 1 TABLET BY MOUTH DAILY 11/19/21   Parks Ranger, Devonne Doughty, DO  pioglitazone (ACTOS) 45 MG tablet Take 45 mg by mouth daily. Patient not taking: Reported on 11/29/2021    [provider]  QUEtiapine (SEROQUEL) 100 MG tablet TAKE 1 TABLET BY MOUTH AT  BEDTIME 11/19/21   Karamalegos, Devonne Doughty, DO  RELISTOR 150 MG TABS Take 3 tablets by mouth every morning. 07/02/21   [provider]  topiramate (TOPAMAX) 100 MG tablet TAKE 1 TABLET BY MOUTH TWICE  DAILY 11/19/21   Parks Ranger, Devonne Doughty, DO  TRELEGY ELLIPTA 100-62.5-25 MCG/ACT  AEPB INHALE 1 PUFF INTO THE LUNGS DAILY 06/25/21   Parks Ranger, Devonne Doughty, DO  triamcinolone cream (KENALOG) 0.1 % Apply 1 application. topically 2 (two) times daily. 05/15/21   Karamalegos, Devonne Doughty, DO  Vitamin D, Ergocalciferol, (DRISDOL) 50000 units CAPS capsule Take 50,000 Units by mouth every 7 (seven) days. On Wednesday    [provider]    Physical Exam: Vitals:   12/08/21 1938 12/08/21 2218 12/08/21 2220 12/08/21 2330  BP: 107/76 115/79  126/74  Pulse: 98 98 94 93  Resp: (!) '22 16 17 18  '$ Temp: 97.9 F (36.6 C)   97.8 F (36.6 C)  TempSrc: Oral   Oral  SpO2: 98% 99% 96% 98%  Weight: 112.4 kg      Physical Exam Vitals and nursing note reviewed.  Constitutional:      General: She is not in acute distress.    Appearance: She is morbidly obese.  HENT:     Head: Normocephalic and atraumatic.  Cardiovascular:     Rate and Rhythm: Normal rate and regular rhythm.     Heart sounds: Normal heart sounds.  Pulmonary:     Effort: Pulmonary effort is normal.     Breath sounds: Normal breath sounds.  Abdominal:     Palpations: Abdomen is soft.     Tenderness: There is no abdominal tenderness.  Neurological:     Mental Status: She is lethargic and confused.     Labs on Admission: I have personally reviewed following labs and imaging studies  CBC: Recent Labs  Lab 12/08/21 1944  WBC 12.7*  NEUTROABS 9.8*  HGB 15.1*  HCT 46.3*  MCV 81.4  PLT 703   Basic Metabolic Panel: Recent Labs  Lab 12/08/21 1944  NA 133*  K 3.7  CL 100  CO2 17*  GLUCOSE 150*  BUN 19  CREATININE 0.74  CALCIUM 9.3   GFR: Estimated Creatinine Clearance: 97.4 mL/min (by C-G formula based on SCr of 0.74 mg/dL). Liver Function Tests: Recent Labs  Lab 12/08/21 1944  AST 20  ALT 12  ALKPHOS 83  BILITOT 1.1  PROT 8.0  ALBUMIN 3.8   No results for input(s): "LIPASE", "AMYLASE" in the last 168 hours. No results for input(s): "AMMONIA" in the last 168 hours. Coagulation  Profile: No results for input(s): "INR", "PROTIME" in the last 168 hours. Cardiac Enzymes: No results for input(s): "CKTOTAL", "CKMB", "CKMBINDEX", "TROPONINI" in the last 168 hours. BNP (last 3 results) No results for  input(s): "PROBNP" in the last 8760 hours. HbA1C: No results for input(s): "HGBA1C" in the last 72 hours. CBG: No results for input(s): "GLUCAP" in the last 168 hours. Lipid Profile: No results for input(s): "CHOL", "HDL", "LDLCALC", "TRIG", "CHOLHDL", "LDLDIRECT" in the last 72 hours. Thyroid Function Tests: No results for input(s): "TSH", "T4TOTAL", "FREET4", "T3FREE", "THYROIDAB" in the last 72 hours. Anemia Panel: No results for input(s): "VITAMINB12", "FOLATE", "FERRITIN", "TIBC", "IRON", "RETICCTPCT" in the last 72 hours. Urine analysis:    Component Value Date/Time   COLORURINE YELLOW (A) 12/08/2021 2343   APPEARANCEUR HAZY (A) 12/08/2021 2343   APPEARANCEUR Clear 09/23/2013 0154   LABSPEC >1.046 (H) 12/08/2021 2343   LABSPEC 1.030 09/23/2013 0154   PHURINE 5.0 12/08/2021 2343   GLUCOSEU NEGATIVE 12/08/2021 2343   GLUCOSEU >=500 09/23/2013 0154   HGBUR NEGATIVE 12/08/2021 [place] 12/08/2021 2343   BILIRUBINUR Negative 09/23/2013 0154   KETONESUR 80 (A) 12/08/2021 2343   PROTEINUR 30 (A) 12/08/2021 2343   UROBILINOGEN 0.2 03/11/2012 1234   NITRITE NEGATIVE 12/08/2021 2343   LEUKOCYTESUR TRACE (A) 12/08/2021 2343   LEUKOCYTESUR Negative 09/23/2013 0154    Radiological Exams on Admission: CT Abdomen Pelvis W Contrast  Result Date: 12/08/2021 CLINICAL DATA:  Abdominal pain. EXAM: CT ABDOMEN AND PELVIS WITH CONTRAST TECHNIQUE: Multidetector CT imaging of the abdomen and pelvis was performed using the standard protocol following bolus administration of intravenous contrast. RADIATION DOSE REDUCTION: This exam was performed according to the departmental dose-optimization program which includes automated exposure control, adjustment of the  mA and/or kV according to patient size and/or use of iterative reconstruction technique. CONTRAST:  110m OMNIPAQUE IOHEXOL 350 MG/ML SOLN COMPARISON:  February 21, 2021 FINDINGS: Lower chest: No acute abnormality. Hepatobiliary: No focal liver abnormality is seen. Mild, stable central intrahepatic biliary dilatation is noted. The gallbladder is moderately distended. No gallstones, gallbladder wall thickening, or extrahepatic biliary dilatation. Pancreas: Unremarkable. No pancreatic ductal dilatation or surrounding inflammatory changes. Spleen: Normal in size without focal abnormality. Adrenals/Urinary Tract: Adrenal glands are unremarkable. Kidneys are normal, without renal calculi, focal lesion, or hydronephrosis. Bladder is unremarkable. Stomach/Bowel: Stomach is within normal limits. The appendix is not clearly identified. No evidence of bowel wall thickening, distention, or inflammatory changes. Noninflamed diverticula are seen throughout the sigmoid colon. Vascular/Lymphatic: Aortic atherosclerosis. No enlarged abdominal or pelvic lymph nodes. Reproductive: Status post hysterectomy. No adnexal masses. Other: A stable 3.0 cm x 5.3 cm ventral hernia is seen along the anterior abdominal wall. An additional stable 2.1 cm x 5.3 cm ventral hernia is seen along the anterior pelvic wall. No abdominopelvic ascites. Musculoskeletal: Postoperative changes are seen at the levels of L4 and L5. IMPRESSION: 1. Sigmoid diverticulosis. 2. Stable ventral hernias along the anterior abdominal wall and anterior pelvic wall. 3. Postoperative changes at the levels of L4 and L5. 4. Aortic atherosclerosis. Aortic Atherosclerosis (ICD10-I70.0). Electronically Signed   By: TVirgina NorfolkM.D.   On: 12/08/2021 23:22   CT Head Wo Contrast  Result Date: 12/08/2021 CLINICAL DATA:  Altered mental status. EXAM: CT HEAD WITHOUT CONTRAST TECHNIQUE: Contiguous axial images were obtained from the base of the skull through the vertex  without intravenous contrast. RADIATION DOSE REDUCTION: This exam was performed according to the departmental dose-optimization program which includes automated exposure control, adjustment of the mA and/or kV according to patient size and/or use of iterative reconstruction technique. COMPARISON:  April 07, 2017 FINDINGS: Brain: There is mild cerebral atrophy with widening of the extra-axial spaces and  ventricular dilatation. There are areas of decreased attenuation within the white matter tracts of the supratentorial brain, consistent with microvascular disease changes. Chronic left basal ganglia lacunar infarcts are seen. Vascular: No hyperdense vessel or unexpected calcification. Skull: Normal. Negative for fracture or focal lesion. Sinuses/Orbits: No acute finding. Other: None. IMPRESSION: 1. No acute intracranial abnormality. 2. Generalized cerebral atrophy and microvascular disease changes of the supratentorial brain. 3. Chronic left basal ganglia lacunar infarcts. Electronically Signed   By: Virgina Norfolk M.D.   On: 12/08/2021 22:01   DG Chest Port 1 View  Result Date: 12/08/2021 CLINICAL DATA:  Questionable sepsis EXAM: PORTABLE CHEST - 1 VIEW COMPARISON:  06/12/2020 FINDINGS: Coarse perihilar interstitial opacities, most marked in the left lower lung as before. Heart size and mediastinal contours are within normal limits. No effusion. Cervical fixation hardware noted.  Bilateral shoulder DJD. IMPRESSION: Persistent coarse perihilar interstitial opacities, most marked in the left lower lung. Electronically Signed   By: Lucrezia Europe M.D.   On: 12/08/2021 19:43     Data Reviewed: Relevant notes from primary care and specialist visits, past discharge summaries as available in EHR, including Care Everywhere. Prior diagnostic testing as pertinent to current admission diagnoses Updated medications and problem lists for reconciliation ED course, including vitals, labs, imaging, treatment and response  to treatment Triage notes, nursing and pharmacy notes and ED provider's notes Notable results as noted in HPI   Assessment and Plan: * Acute metabolic encephalopathy Etiology uncertain: Differential includes infectious, medication related, TIA CT head was nonacute, MRI pending VBG unremarkable Treat empirically for infection with broad-spectrum antibiotics Rocephin and vancomycin, IV hydration IR consult for LP in the a.m. Hold sedating meds We will keep n.p.o. tonight Fall and aspiration precautions and neurologic checks Follow TSH and procalcitonin  High anion gap metabolic acidosis Unclear etiology Likely ketosis of some sort,?  Starvation.  Doubt euglycemic DKA as patient not on SGLT2 Anion gap of 16 with bicarb of 17.  Ketones 80 Blood sugar 150, renal function WNL IV hydration and monitor  Obesity, Class III, BMI 40-49.9 (morbid obesity) (HCC) Complicating factor in overall prognosis and care  Hemiparesis of right dominant side as late effect of cerebrovascular disease (HCC) Supportive care Continue Plavix and statin At baseline patient uses a motorized wheelchair  Chronic pain syndrome Anxiety and depression Chronic migraine Hold buprenorphine until back to baseline mentation Hold bupropion, Celexa, gabapentin and topiramate until in the a.m. and more alert  COPD (chronic obstructive pulmonary disease) ([place]) Not acutely exacerbated Continue home inhalers DuoNebs as needed        DVT prophylaxis: SCD  Consults: IR consult for LP  Advance Care Planning:   Code Status: Prior   Family Communication: Husband at bedside  Disposition Plan: Back to previous home environment  Severity of Illness: The appropriate patient status for this patient is INPATIENT. Inpatient status is judged to be reasonable and necessary in order to provide the required intensity of service to ensure the patient's safety. The patient's presenting symptoms, physical exam findings,  and initial radiographic and laboratory data in the context of their chronic comorbidities is felt to place them at high risk for further clinical deterioration. Furthermore, it is not anticipated that the patient will be medically stable for discharge from the hospital within 2 midnights of admission.   * I certify that at the point of admission it is my clinical judgment that the patient will require inpatient hospital care spanning beyond 2 midnights from the point  of admission due to high intensity of service, high risk for further deterioration and high frequency of surveillance required.*  Author: Athena Masse, MD 12/09/2021 12:46 AM  For on call review [email or web address].

## 2021-12-09 NOTE — Assessment & Plan Note (Signed)
Complicating factor in overall prognosis and care

## 2021-12-09 NOTE — ED Notes (Signed)
Pt noted to be sitting up in bed upon assumption of care. Rocking back and forth, moaning intermittently. No family at bedside. Attempts to educate pt on safety precautions and importance of using call bell and not attempting to get out of bed. Pt verbalizes understanding. Bed in lowest position, side-rails up with call bell in reach and bed alarm in use.

## 2021-12-10 DIAGNOSIS — B9620 Unspecified Escherichia coli [E. coli] as the cause of diseases classified elsewhere: Secondary | ICD-10-CM | POA: Diagnosis present

## 2021-12-10 DIAGNOSIS — G894 Chronic pain syndrome: Secondary | ICD-10-CM

## 2021-12-10 DIAGNOSIS — E876 Hypokalemia: Secondary | ICD-10-CM | POA: Diagnosis not present

## 2021-12-10 DIAGNOSIS — G9341 Metabolic encephalopathy: Secondary | ICD-10-CM | POA: Diagnosis not present

## 2021-12-10 DIAGNOSIS — N390 Urinary tract infection, site not specified: Secondary | ICD-10-CM | POA: Diagnosis not present

## 2021-12-10 LAB — CBG MONITORING, ED
Glucose-Capillary: 101 mg/dL — ABNORMAL HIGH (ref 70–99)
Glucose-Capillary: 113 mg/dL — ABNORMAL HIGH (ref 70–99)
Glucose-Capillary: 120 mg/dL — ABNORMAL HIGH (ref 70–99)
Glucose-Capillary: 124 mg/dL — ABNORMAL HIGH (ref 70–99)
Glucose-Capillary: 135 mg/dL — ABNORMAL HIGH (ref 70–99)
Glucose-Capillary: 139 mg/dL — ABNORMAL HIGH (ref 70–99)
Glucose-Capillary: 143 mg/dL — ABNORMAL HIGH (ref 70–99)

## 2021-12-10 LAB — HEMOGLOBIN A1C
Hgb A1c MFr Bld: 6.7 % — ABNORMAL HIGH (ref 4.8–5.6)
Mean Plasma Glucose: 146 mg/dL

## 2021-12-10 LAB — CREATININE, SERUM
Creatinine, Ser: 0.62 mg/dL (ref 0.44–1.00)
GFR, Estimated: >60 mL/min (ref >60)

## 2021-12-10 MED ORDER — ATORVASTATIN CALCIUM 20 MG PO TABS
40.0000 mg | ORAL_TABLET | Freq: Every day | ORAL | Status: DC
  Administered 2021-12-10 – 2021-12-13 (×4): 40 mg via ORAL
  Filled 2021-12-10 (×4): qty 2

## 2021-12-10 MED ORDER — QUETIAPINE FUMARATE 25 MG PO TABS
100.0000 mg | ORAL_TABLET | Freq: Every day | ORAL | Status: DC
  Administered 2021-12-10: 50 mg via ORAL
  Administered 2021-12-11 – 2021-12-13 (×3): 100 mg via ORAL
  Filled 2021-12-10 (×4): qty 4

## 2021-12-10 MED ORDER — ALBUTEROL SULFATE (2.5 MG/3ML) 0.083% IN NEBU: 3.0000 mL | INHALATION_SOLUTION | Freq: Four times a day (QID) | RESPIRATORY_TRACT | Status: DC | PRN

## 2021-12-10 MED ORDER — OXYCODONE-ACETAMINOPHEN 5-325 MG PO TABS
1.0000 | ORAL_TABLET | Freq: Four times a day (QID) | ORAL | Status: AC | PRN
  Administered 2021-12-10 (×2): 1 via ORAL
  Filled 2021-12-10 (×2): qty 1

## 2021-12-10 MED ORDER — CITALOPRAM HYDROBROMIDE 20 MG PO TABS
40.0000 mg | ORAL_TABLET | Freq: Every day | ORAL | Status: DC
  Administered 2021-12-10 – 2021-12-14 (×5): 40 mg via ORAL
  Filled 2021-12-10 (×5): qty 2

## 2021-12-10 MED ORDER — PANTOPRAZOLE SODIUM 40 MG PO TBEC
40.0000 mg | DELAYED_RELEASE_TABLET | Freq: Every day | ORAL | Status: DC
  Administered 2021-12-10 – 2021-12-14 (×5): 40 mg via ORAL
  Filled 2021-12-10 (×5): qty 1

## 2021-12-10 MED ORDER — TOPIRAMATE 100 MG PO TABS
100.0000 mg | ORAL_TABLET | Freq: Two times a day (BID) | ORAL | Status: DC
  Administered 2021-12-10 – 2021-12-14 (×9): 100 mg via ORAL
  Filled 2021-12-10: qty 4
  Filled 2021-12-10 (×7): qty 1
  Filled 2021-12-10: qty 4
  Filled 2021-12-10: qty 1

## 2021-12-10 MED ORDER — SODIUM CHLORIDE 0.9 % IV SOLN
1.0000 g | Freq: Every day | INTRAVENOUS | Status: DC
  Administered 2021-12-11: 1 g via INTRAVENOUS
  Filled 2021-12-10: qty 10

## 2021-12-10 MED ORDER — DEXTROSE 5 % IV SOLN
800.0000 mg | Freq: Three times a day (TID) | INTRAVENOUS | Status: DC
  Administered 2021-12-10: 800 mg via INTRAVENOUS
  Filled 2021-12-10 (×2): qty 16

## 2021-12-10 MED ORDER — BUPROPION HCL ER (XL) 150 MG PO TB24
150.0000 mg | ORAL_TABLET | Freq: Every day | ORAL | Status: DC
  Administered 2021-12-10 – 2021-12-14 (×5): 150 mg via ORAL
  Filled 2021-12-10 (×5): qty 1

## 2021-12-10 MED ORDER — CLOPIDOGREL BISULFATE 75 MG PO TABS
75.0000 mg | ORAL_TABLET | Freq: Every day | ORAL | Status: DC
  Administered 2021-12-10 – 2021-12-14 (×5): 75 mg via ORAL
  Filled 2021-12-10 (×5): qty 1

## 2021-12-10 MED ORDER — FLUTICASONE FUROATE-VILANTEROL 100-25 MCG/ACT IN AEPB
1.0000 | INHALATION_SPRAY | Freq: Every day | RESPIRATORY_TRACT | Status: DC
  Administered 2021-12-12 – 2021-12-14 (×3): 1 via RESPIRATORY_TRACT
  Filled 2021-12-10: qty 28

## 2021-12-10 MED ORDER — GABAPENTIN 300 MG PO CAPS
600.0000 mg | ORAL_CAPSULE | Freq: Three times a day (TID) | ORAL | Status: DC
  Administered 2021-12-10 – 2021-12-14 (×12): 600 mg via ORAL
  Filled 2021-12-10 (×12): qty 2

## 2021-12-10 MED ORDER — UMECLIDINIUM BROMIDE 62.5 MCG/ACT IN AEPB
1.0000 | INHALATION_SPRAY | Freq: Every day | RESPIRATORY_TRACT | Status: DC
  Administered 2021-12-12 – 2021-12-14 (×3): 1 via RESPIRATORY_TRACT
  Filled 2021-12-10: qty 7

## 2021-12-10 MED ORDER — VITAMIN D (ERGOCALCIFEROL) 1.25 MG (50000 UNIT) PO CAPS
50000.0000 [IU] | ORAL_CAPSULE | ORAL | Status: DC
  Filled 2021-12-10: qty 1

## 2021-12-10 MED ORDER — BUPRENORPHINE HCL-NALOXONE HCL 8-2 MG SL SUBL
1.0000 | SUBLINGUAL_TABLET | Freq: Every day | SUBLINGUAL | Status: DC
  Administered 2021-12-11: 1 via SUBLINGUAL
  Filled 2021-12-10 (×3): qty 1

## 2021-12-10 NOTE — ED Notes (Signed)
Per pt, no allergy to morphine "it just doesn't work"

## 2021-12-10 NOTE — ED Notes (Signed)
Pt given a cup of ice at this time. Family at bedside. Call light within reach. Pt has no further needs at this time.

## 2021-12-10 NOTE — ED Notes (Signed)
Pharmacy called for ordered Acyclovir at this time. Per pharmacy meds To be retimed and sent up

## 2021-12-10 NOTE — Progress Notes (Addendum)
Progress Note    MEAGHEN VECCHIARELLI  FGH:[id number] DOB: 04-16-1965  DOA: 12/08/2021 PCP: Olin Hauser, DO      Brief Narrative:    Medical records reviewed and are as summarized below:  [name] is a 56 y.o. female  with medical history significant for Hypertension, type 2 diabetes depression, prior stroke with right-sided deficits, non ambulant, dependent on a motorized wheelchair , class III obesity, COPD, chronic pain on buprenorphine film, depression on multiple psychoactive pills, nicotine dependence, ventral hernias s/p repair who presented to the ED with altered mental status.  Reportedly, she had stopped eating and drinking about 3 days prior to admission and vomited on the day of admission.  She became more confused so she was brought to the ED for further evaluation.   She was treated with empiric antimicrobial therapy for suspected meningitis.  She underwent lumbar puncture and CSF analysis did not show evidence of meningitis.  Urinalysis was abnormal and urine culture showed E. coli continue IV ceftriaxone was continued for suspected UTI. Altered mental status may be a combination of infection and psychoactive drugs.      Assessment/Plan:   Principal Problem:   Acute metabolic encephalopathy Active Problems:   High anion gap metabolic acidosis   COPD (chronic obstructive pulmonary disease) (HCC)   Anxiety and depression   Chronic hypoxemic respiratory failure (HCC)   Chronic pain syndrome   Hemiparesis of right dominant side as late effect of cerebrovascular disease (HCC)   Obesity, Class III, BMI 40-49.9 (morbid obesity) (HCC)   E. coli UTI   Hypokalemia    Body mass index is 40 kg/m.  (Morbid obesity)   Acute metabolic encephalopathy: Improved.  Meningitis has been ruled out.  Discontinue broad-spectrum antibiotics for meningitis.  No acute abnormality on MRI brain.  Probable E. coli UTI: Continue IV ceftriaxone.  Follow-up urine  culture sensitivity report.  Hypokalemia: Repeat BMP  Lactic acidosis/metabolic acidosis: Improved  History of stroke, uses motorized wheelchair to get around: Continue Plavix  Other comorbidities include migraine, chronic pain syndrome on buprenorphine, anxiety, depression, COPD   Diet Order             Diet NPO time specified  Diet effective now                            Consultants: None  Procedures: Lumbar puncture on 12/09/2021    Medications:    atorvastatin  40 mg Oral QHS   [START ON 12/11/2021] buprenorphine-naloxone  1 tablet Sublingual Daily   buPROPion  150 mg Oral Daily   citalopram  40 mg Oral Daily   clopidogrel  75 mg Oral Daily   [START ON 12/11/2021] fluticasone furoate-vilanterol  1 puff Inhalation Daily   And   [START ON 12/11/2021] umeclidinium bromide  1 puff Inhalation Daily   gabapentin  600 mg Oral TID   insulin aspart  0-20 Units Subcutaneous Q4H   lidocaine (PF)  5 mL Intradermal Once   pantoprazole  40 mg Oral Daily   QUEtiapine  100 mg Oral QHS   topiramate  100 mg Oral BID   [START ON 12/11/2021] Vitamin D (Ergocalciferol)  50,000 Units Oral Q7 days   Continuous Infusions:  sodium chloride     [START ON 12/11/2021] cefTRIAXone (ROCEPHIN)  IV       Anti-infectives (From admission, onward)    Start     Dose/Rate Route Frequency Ordered  Stop   12/11/21 1000  cefTRIAXone (ROCEPHIN) 1 g in sodium chloride 0.9 % 100 mL IVPB        1 g 200 mL/hr over 30 Minutes Intravenous Daily 12/10/21 0948     12/10/21 0600  acyclovir (ZOVIRAX) 800 mg in dextrose 5 % 250 mL IVPB  Status:  Discontinued        800 mg 266 mL/hr over 60 Minutes Intravenous Every 8 hours 12/10/21 0220 12/10/21 0946   12/10/21 0200  acyclovir (ZOVIRAX) 800 mg in dextrose 5 % 250 mL IVPB  Status:  Discontinued        800 mg 266 mL/hr over 60 Minutes Intravenous Every 8 hours 12/09/21 1701 12/10/21 0219   12/09/21 1630  vancomycin (VANCOCIN) IVPB 1000  mg/200 mL premix  Status:  Discontinued        1,000 mg 200 mL/hr over 60 Minutes Intravenous Every 12 hours 12/09/21 0211 12/10/21 0946   12/09/21 1000  acyclovir (ZOVIRAX) 595 mg in dextrose 5 % 100 mL IVPB  Status:  Discontinued        10 mg/kg  59.3 kg (Ideal) 111.9 mL/hr over 60 Minutes Intravenous Every 8 hours 12/09/21 0201 12/10/21 0159   12/09/21 0300  vancomycin (VANCOCIN) IVPB 1000 mg/200 mL premix  Status:  Discontinued        1,000 mg 200 mL/hr over 60 Minutes Intravenous Every 12 hours 12/09/21 0207 12/09/21 0211   12/09/21 0215  vancomycin (VANCOCIN) IVPB 1000 mg/200 mL premix       See Hyperspace for full Linked Orders Report.   1,000 mg 200 mL/hr over 60 Minutes Intravenous  Once 12/09/21 0201 12/09/21 0536   12/09/21 0215  vancomycin (VANCOREADY) IVPB 1500 mg/300 mL       See Hyperspace for full Linked Orders Report.   1,500 mg 150 mL/hr over 120 Minutes Intravenous  Once 12/09/21 0201 12/09/21 0805   12/09/21 0200  cefTRIAXone (ROCEPHIN) 2 g in sodium chloride 0.9 % 100 mL IVPB  Status:  Discontinued        2 g 200 mL/hr over 30 Minutes Intravenous Every 12 hours 12/09/21 0148 12/10/21 0946   12/09/21 0200  vancomycin (VANCOCIN) IVPB 1000 mg/200 mL premix  Status:  Discontinued        1,000 mg 200 mL/hr over 60 Minutes Intravenous  Once 12/09/21 0148 12/09/21 0200   12/09/21 0200  ampicillin (OMNIPEN) 2 g in sodium chloride 0.9 % 100 mL IVPB  Status:  Discontinued        2 g 300 mL/hr over 20 Minutes Intravenous  Once 12/09/21 0148 12/09/21 0158   12/09/21 0200  acyclovir (ZOVIRAX) 595 mg in dextrose 5 % 100 mL IVPB  Status:  Discontinued        10 mg/kg  59.3 kg (Ideal) 111.9 mL/hr over 60 Minutes Intravenous Every 8 hours 12/09/21 0148 12/09/21 0201              Family Communication/Anticipated D/C date and plan/Code Status   DVT prophylaxis: SCDs Start: 12/09/21 0150     Code Status: Full Code  Family Communication: None Disposition Plan: Plan  to discharge home tomorrow   Status is: Inpatient Remains inpatient appropriate because: On IV antibiotics       Subjective:   Interval events noted.  She feels much better today.  She said she is now able to walk and stays in bed for the most part and gets around in a  wheelchair.  Objective:  Vitals:   12/10/21 0845 12/10/21 1000 12/10/21 1200 12/10/21 1224  BP: 98/63 125/69 127/66   Pulse: 90 84 84   Resp: (!) '22 13 16   '$ Temp:    97.7 F (36.5 C)  TempSrc:    Oral  SpO2: 94% 96% 96%   Weight:       No data found.   Intake/Output Summary (Last 24 hours) at 12/10/2021 1418 Last data filed at 12/10/2021 0731 Gross per 24 hour  Intake 863.94 ml  Output 500 ml  Net 363.94 ml   Filed Weights   12/08/21 1938  Weight: 112.4 kg    Exam:  GEN: NAD SKIN: No rash EYES: EOMI ENT: MMM CV: RRR PULM: CTA B ABD: soft, obese, NT, +BS CNS: AAO x 3, non focal, she is able to move all extremities spontaneously EXT: No edema or tenderness        Data Reviewed:   I have personally reviewed following labs and imaging studies:  Labs: Labs show the following:   Basic Metabolic Panel: Recent Labs  Lab 12/08/21 1944 12/09/21 0430 12/10/21 0740  NA 133* 134*  --   K 3.7 3.2*  --   CL 100 101  --   CO2 17* 20*  --   GLUCOSE 150* 152*  --   BUN 19 18  --   CREATININE 0.74 0.70 0.62  CALCIUM 9.3 8.9  --    GFR Estimated Creatinine Clearance: 97.4 mL/min (by C-G formula based on SCr of 0.62 mg/dL). Liver Function Tests: Recent Labs  Lab 12/08/21 1944 12/09/21 0430  AST 20 15  ALT 12 9  ALKPHOS 83 70  BILITOT 1.1 0.9  PROT 8.0 6.9  ALBUMIN 3.8 3.3*   No results for input(s): "LIPASE", "AMYLASE" in the last 168 hours. No results for input(s): "AMMONIA" in the last 168 hours. Coagulation profile Recent Labs  Lab 12/09/21 0430  INR 1.3*    CBC: Recent Labs  Lab 12/08/21 1944 12/09/21 0430  WBC 12.7* 14.0*  NEUTROABS 9.8*  --   HGB 15.1*  14.0  HCT 46.3* 41.4  MCV 81.4 81.3  PLT 383 396   Cardiac Enzymes: No results for input(s): "CKTOTAL", "CKMB", "CKMBINDEX", "TROPONINI" in the last 168 hours. BNP (last 3 results) No results for input(s): "PROBNP" in the last 8760 hours. CBG: Recent Labs  Lab 12/09/21 2028 12/09/21 2350 12/10/21 0412 12/10/21 0732 12/10/21 1154  GLUCAP 108* 118* 113* 143* 139*   D-Dimer: No results for input(s): "DDIMER" in the last 72 hours. Hgb A1c: Recent Labs    12/09/21 0430  HGBA1C 6.7*   Lipid Profile: No results for input(s): "CHOL", "HDL", "LDLCALC", "TRIG", "CHOLHDL", "LDLDIRECT" in the last 72 hours. Thyroid function studies: Recent Labs    12/08/21 1944  TSH 0.921   Anemia work up: No results for input(s): "VITAMINB12", "FOLATE", "FERRITIN", "TIBC", "IRON", "RETICCTPCT" in the last 72 hours. Sepsis Labs: Recent Labs  Lab 12/08/21 1944 12/08/21 2119 12/08/21 2343 12/09/21 0430  PROCALCITON <0.10  --   --   --   WBC 12.7*  --   --  14.0*  LATICACIDVEN  --  2.1* 1.6  --     Microbiology Recent Results (from the past 240 hour(s))  Resp Panel by RT-PCR (Flu A&B, Covid) Anterior Nasal Swab     Status: None   Collection Time: 12/08/21  7:20 PM   Specimen: Anterior Nasal Swab  Result Value Ref Range Status   SARS Coronavirus 2 by RT  PCR NEGATIVE NEGATIVE Final    Comment: (NOTE) SARS-CoV-2 target nucleic acids are NOT DETECTED.  The SARS-CoV-2 RNA is generally detectable in upper respiratory specimens during the acute phase of infection. The lowest concentration of SARS-CoV-2 viral copies this assay can detect is 138 copies/mL. A negative result does not preclude SARS-Cov-2 infection and should not be used as the sole basis for treatment or other patient management decisions. A negative result may occur with  improper specimen collection/handling, submission of specimen other than nasopharyngeal swab, presence of viral mutation(s) within the areas targeted by  this assay, and inadequate number of viral copies(<138 copies/mL). A negative result must be combined with clinical observations, patient history, and epidemiological information. The expected result is Negative.  Fact Sheet for Patients:  EntrepreneurPulse.com.au  Fact Sheet for Healthcare Providers:  IncredibleEmployment.be  This test is no t yet approved or cleared by the [place] FDA and  has been authorized for detection and/or diagnosis of SARS-CoV-2 by FDA under an Emergency Use Authorization (EUA). This EUA will remain  in effect (meaning this test can be used) for the duration of the COVID-19 declaration under Section 564(b)(1) of the Act, 21 U.S.C.section 360bbb-3(b)(1), unless the authorization is terminated  or revoked sooner.       Influenza A by PCR NEGATIVE NEGATIVE Final   Influenza B by PCR NEGATIVE NEGATIVE Final    Comment: (NOTE) The Xpert Xpress SARS-CoV-2/FLU/RSV plus assay is intended as an aid in the diagnosis of influenza from Nasopharyngeal swab specimens and should not be used as a sole basis for treatment. Nasal washings and aspirates are unacceptable for Xpert Xpress SARS-CoV-2/FLU/RSV testing.  Fact Sheet for Patients: EntrepreneurPulse.com.au  Fact Sheet for Healthcare Providers: IncredibleEmployment.be  This test is not yet approved or cleared by the [place] FDA and has been authorized for detection and/or diagnosis of SARS-CoV-2 by FDA under an Emergency Use Authorization (EUA). This EUA will remain in effect (meaning this test can be used) for the duration of the COVID-19 declaration under Section 564(b)(1) of the Act, 21 U.S.C. section 360bbb-3(b)(1), unless the authorization is terminated or revoked.  Performed at [facility], [address] [place], [place] [address]   Blood Culture (routine x 2)     Status: None (Preliminary result)    Collection Time: 12/08/21  7:44 PM   Specimen: BLOOD LEFT ARM  Result Value Ref Range Status   Specimen Description BLOOD LEFT ARM  Final   Special Requests   Final    BOTTLES DRAWN AEROBIC AND ANAEROBIC Blood Culture results may not be optimal due to an inadequate volume of blood received in culture bottles   Culture   Final    NO GROWTH 2 DAYS Performed at [facility], [address] [place], [place] [address]    Report Status PENDING  Incomplete  Blood Culture (routine x 2)     Status: None (Preliminary result)   Collection Time: 12/08/21  7:44 PM   Specimen: BLOOD RIGHT ARM  Result Value Ref Range Status   Specimen Description BLOOD RIGHT ARM  Final   Special Requests   Final    BOTTLES DRAWN AEROBIC AND ANAEROBIC Blood Culture results may not be optimal due to an inadequate volume of blood received in culture bottles   Culture   Final    NO GROWTH 2 DAYS Performed at [facility], [address], [place], [place] [address]    Report Status PENDING  Incomplete  Urine Culture  Status: Abnormal (Preliminary result)   Collection Time: 12/08/21 11:43 PM   Specimen: In/Out Cath Urine  Result Value Ref Range Status   Specimen Description   Final    IN/OUT CATH URINE Performed at [facility], [address], [place], [place] [address]    Special Requests   Final    NONE Performed at [facility], [address] [place], [place] [address]    Culture (A)  Final    20,000 COLONIES/mL ESCHERICHIA COLI SUSCEPTIBILITIES TO FOLLOW Performed at [facility], [address], [place], [place] [address]    Report Status PENDING  Incomplete  CSF culture w Gram Stain     Status: None (Preliminary result)   Collection Time: 12/09/21  4:40 PM   Specimen: Cerebrospinal Fluid  Result Value Ref Range Status   Specimen Description CSF  Final   Special Requests NONE  Final   Gram Stain   Final    WBC SEEN NO ORGANISMS SEEN RED BLOOD  CELLS Performed at [facility], [address] [place], [place] [address]    Culture PENDING  Incomplete   Report Status PENDING  Incomplete    Procedures and diagnostic studies:  IR FL GUIDED LOC OF NEEDLE/CATH TIP FOR SPINAL INJECT RT  Result Date: 12/10/2021 CLINICAL DATA:  Altered mental status EXAM: DIAGNOSTIC LUMBAR PUNCTURE UNDER FLUOROSCOPIC GUIDANCE COMPARISON:  None Available. FLUOROSCOPY: Radiation Exposure Index (as provided by the fluoroscopic device): 0.6 minutes (13 mGy) PROCEDURE: Informed consent was obtained from the patient prior to the procedure, including potential complications of headache, allergy, and pain. With the patient in the lateral decubitus position, the lower back was prepped with chlorhexidine. 1% Lidocaine was used for local anesthesia. Lumbar puncture was performed at the L5-S1 level using a 20 gauge needle with return of clear, colorless CSF with an opening pressure of 26 cm H2O. Approximately 20 ml of CSF were obtained for laboratory studies. Closing pressure was estimated at 14 cm H2O. The patient tolerated the procedure well and there were no apparent complications. IMPRESSION: Successful lumbar puncture using fluoroscopic guidance. Sample of clear, colorless CSF was sent to the lab for analysis. Opening pressure was estimated at 26 cm H2O, with a closing pressure of 14 cm H2O. Electronically Signed   By: Albin Felling M.D.   On: 12/10/2021 08:40   IR FL GUIDED LOC OF NEEDL/CATH TIP FOR SPINAL INJECT LT  Result Date: 12/09/2021 CLINICAL DATA:  Altered mental status EXAM: Attempted lumbar puncture using fluoroscopy COMPARISON:  None Available. FLUOROSCOPY: Radiation Exposure Index (as provided by the fluoroscopic device): 1.2 minutes (32 mGy) PROCEDURE: Attempts at positioning of the patient on the procedure table in both the prone and lateral decubitus positions were unsuccessful due to patient's inability to follow instructions. Procedure  aborted. IMPRESSION: Attempted lumbar puncture aborted due to patient's inability to follow instructions and safety concerns. Patient will be rescheduled with sedation. Electronically Signed   By: Albin Felling M.D.   On: 12/09/2021 13:31   MR BRAIN WO CONTRAST  Result Date: 12/09/2021 CLINICAL DATA:  Altered mental status EXAM: MRI HEAD WITHOUT CONTRAST TECHNIQUE: Multiplanar, multiecho pulse sequences of the brain and surrounding structures were obtained without intravenous contrast. COMPARISON:  04/07/2017 MRI, correlation is also made with CT head 12/08/2021 FINDINGS: Evaluation is significantly limited by motion artifact. In addition, the study could not be completed due to patient pain and motion. Brain: No restricted diffusion to suggest acute or subacute infarct. No definite  acute hemorrhage, mass, mass effect, or midline shift. No hydrocephalus or definite extra-axial collection. Lacunar infarcts in the left basal ganglia and pons. Vascular: Grossly normal arterial flow voids. Skull and upper cervical spine: Unable to evaluate. Sinuses/Orbits: Clear paranasal sinuses. No acute finding in the orbits. Other: None. IMPRESSION: Evaluation is limited by motion artifact and early termination. Within this limitation, no acute intracranial process. No evidence of acute or subacute infarct. Electronically Signed   By: Merilyn Baba M.D.   On: 12/09/2021 01:58   CT Abdomen Pelvis W Contrast  Result Date: 12/08/2021 CLINICAL DATA:  Abdominal pain. EXAM: CT ABDOMEN AND PELVIS WITH CONTRAST TECHNIQUE: Multidetector CT imaging of the abdomen and pelvis was performed using the standard protocol following bolus administration of intravenous contrast. RADIATION DOSE REDUCTION: This exam was performed according to the departmental dose-optimization program which includes automated exposure control, adjustment of the mA and/or kV according to patient size and/or use of iterative reconstruction technique. CONTRAST:   124m OMNIPAQUE IOHEXOL 350 MG/ML SOLN COMPARISON:  February 21, 2021 FINDINGS: Lower chest: No acute abnormality. Hepatobiliary: No focal liver abnormality is seen. Mild, stable central intrahepatic biliary dilatation is noted. The gallbladder is moderately distended. No gallstones, gallbladder wall thickening, or extrahepatic biliary dilatation. Pancreas: Unremarkable. No pancreatic ductal dilatation or surrounding inflammatory changes. Spleen: Normal in size without focal abnormality. Adrenals/Urinary Tract: Adrenal glands are unremarkable. Kidneys are normal, without renal calculi, focal lesion, or hydronephrosis. Bladder is unremarkable. Stomach/Bowel: Stomach is within normal limits. The appendix is not clearly identified. No evidence of bowel wall thickening, distention, or inflammatory changes. Noninflamed diverticula are seen throughout the sigmoid colon. Vascular/Lymphatic: Aortic atherosclerosis. No enlarged abdominal or pelvic lymph nodes. Reproductive: Status post hysterectomy. No adnexal masses. Other: A stable 3.0 cm x 5.3 cm ventral hernia is seen along the anterior abdominal wall. An additional stable 2.1 cm x 5.3 cm ventral hernia is seen along the anterior pelvic wall. No abdominopelvic ascites. Musculoskeletal: Postoperative changes are seen at the levels of L4 and L5. IMPRESSION: 1. Sigmoid diverticulosis. 2. Stable ventral hernias along the anterior abdominal wall and anterior pelvic wall. 3. Postoperative changes at the levels of L4 and L5. 4. Aortic atherosclerosis. Aortic Atherosclerosis (ICD10-I70.0). Electronically Signed   By: TVirgina NorfolkM.D.   On: 12/08/2021 23:22   CT Head Wo Contrast  Result Date: 12/08/2021 CLINICAL DATA:  Altered mental status. EXAM: CT HEAD WITHOUT CONTRAST TECHNIQUE: Contiguous axial images were obtained from the base of the skull through the vertex without intravenous contrast. RADIATION DOSE REDUCTION: This exam was performed according to the  departmental dose-optimization program which includes automated exposure control, adjustment of the mA and/or kV according to patient size and/or use of iterative reconstruction technique. COMPARISON:  April 07, 2017 FINDINGS: Brain: There is mild cerebral atrophy with widening of the extra-axial spaces and ventricular dilatation. There are areas of decreased attenuation within the white matter tracts of the supratentorial brain, consistent with microvascular disease changes. Chronic left basal ganglia lacunar infarcts are seen. Vascular: No hyperdense vessel or unexpected calcification. Skull: Normal. Negative for fracture or focal lesion. Sinuses/Orbits: No acute finding. Other: None. IMPRESSION: 1. No acute intracranial abnormality. 2. Generalized cerebral atrophy and microvascular disease changes of the supratentorial brain. 3. Chronic left basal ganglia lacunar infarcts. Electronically Signed   By: TVirgina NorfolkM.D.   On: 12/08/2021 22:01   DG Chest Port 1 View  Result Date: 12/08/2021 CLINICAL DATA:  Questionable sepsis EXAM: PORTABLE CHEST - 1 VIEW COMPARISON:  06/12/2020 FINDINGS: Coarse perihilar interstitial opacities, most marked in the left lower lung as before. Heart size and mediastinal contours are within normal limits. No effusion. Cervical fixation hardware noted.  Bilateral shoulder DJD. IMPRESSION: Persistent coarse perihilar interstitial opacities, most marked in the left lower lung. Electronically Signed   By: Lucrezia Europe M.D.   On: 12/08/2021 19:43               LOS: 1 day   Necie Wilcoxson  [facility]   Pager on [email or web address]. If 7PM-7AM, please contact night-coverage at [email or web address]     12/10/2021, 2:18 PM

## 2021-12-11 ENCOUNTER — Other Ambulatory Visit: Payer: Self-pay

## 2021-12-11 ENCOUNTER — Encounter: Payer: Self-pay | Admitting: Internal Medicine

## 2021-12-11 DIAGNOSIS — I69951 Hemiplegia and hemiparesis following unspecified cerebrovascular disease affecting right dominant side: Secondary | ICD-10-CM

## 2021-12-11 DIAGNOSIS — F32A Depression, unspecified: Secondary | ICD-10-CM

## 2021-12-11 DIAGNOSIS — J449 Chronic obstructive pulmonary disease, unspecified: Secondary | ICD-10-CM

## 2021-12-11 DIAGNOSIS — J9611 Chronic respiratory failure with hypoxia: Secondary | ICD-10-CM

## 2021-12-11 DIAGNOSIS — E8729 Other acidosis: Secondary | ICD-10-CM

## 2021-12-11 DIAGNOSIS — F419 Anxiety disorder, unspecified: Secondary | ICD-10-CM

## 2021-12-11 DIAGNOSIS — G9341 Metabolic encephalopathy: Secondary | ICD-10-CM | POA: Diagnosis not present

## 2021-12-11 LAB — CBC WITH DIFFERENTIAL/PLATELET
Abs Immature Granulocytes: 0.05 10*3/uL (ref 0.00–0.07)
Basophils Absolute: 0.1 10*3/uL (ref 0.0–0.1)
Basophils Relative: 1 %
Eosinophils Absolute: 0.2 10*3/uL (ref 0.0–0.5)
Eosinophils Relative: 2 %
HCT: 42 % (ref 36.0–46.0)
Hemoglobin: 13.7 g/dL (ref 12.0–15.0)
Immature Granulocytes: 1 %
Lymphocytes Relative: 32 %
Lymphs Abs: 2.8 10*3/uL (ref 0.7–4.0)
MCH: 26.6 pg (ref 26.0–34.0)
MCHC: 32.6 g/dL (ref 30.0–36.0)
MCV: 81.6 fL (ref 80.0–100.0)
Monocytes Absolute: 0.7 10*3/uL (ref 0.1–1.0)
Monocytes Relative: 8 %
Neutro Abs: 4.9 10*3/uL (ref 1.7–7.7)
Neutrophils Relative %: 56 %
Platelets: 314 10*3/uL (ref 150–400)
RBC: 5.15 MIL/uL — ABNORMAL HIGH (ref 3.87–5.11)
RDW: 14.2 % (ref 11.5–15.5)
WBC: 8.7 10*3/uL (ref 4.0–10.5)
nRBC: 0 % (ref 0.0–0.2)

## 2021-12-11 LAB — BASIC METABOLIC PANEL
Anion gap: 10 (ref 5–15)
Anion gap: 9 (ref 5–15)
BUN: 12 mg/dL (ref 6–20)
BUN: 12 mg/dL (ref 6–20)
CO2: 26 mmol/L (ref 22–32)
CO2: 23 mmol/L (ref 22–32)
Calcium: 8.9 mg/dL (ref 8.9–10.3)
Calcium: 8.6 mg/dL — ABNORMAL LOW (ref 8.9–10.3)
Chloride: 103 mmol/L (ref 98–111)
Chloride: 105 mmol/L (ref 98–111)
Creatinine, Ser: 0.71 mg/dL (ref 0.44–1.00)
Creatinine, Ser: 0.75 mg/dL (ref 0.44–1.00)
GFR, Estimated: >60 mL/min (ref >60)
GFR, Estimated: >60 mL/min (ref >60)
Glucose, Bld: 119 mg/dL — ABNORMAL HIGH (ref 70–99)
Glucose, Bld: 130 mg/dL — ABNORMAL HIGH (ref 70–99)
Potassium: 2.5 mmol/L — CL (ref 3.5–5.1)
Potassium: 3.6 mmol/L (ref 3.5–5.1)
Sodium: 139 mmol/L (ref 135–145)
Sodium: 137 mmol/L (ref 135–145)

## 2021-12-11 LAB — GLUCOSE, CAPILLARY
Glucose-Capillary: 124 mg/dL — ABNORMAL HIGH (ref 70–99)
Glucose-Capillary: 119 mg/dL — ABNORMAL HIGH (ref 70–99)
Glucose-Capillary: 182 mg/dL — ABNORMAL HIGH (ref 70–99)
Glucose-Capillary: 152 mg/dL — ABNORMAL HIGH (ref 70–99)
Glucose-Capillary: 148 mg/dL — ABNORMAL HIGH (ref 70–99)

## 2021-12-11 LAB — URINE CULTURE: Culture: 20000 — AB

## 2021-12-11 LAB — MAGNESIUM
Magnesium: 2.1 mg/dL (ref 1.7–2.4)
Magnesium: 2 mg/dL (ref 1.7–2.4)

## 2021-12-11 LAB — HIV ANTIBODY (ROUTINE TESTING W REFLEX): HIV Screen 4th Generation wRfx: NONREACTIVE

## 2021-12-11 LAB — VDRL, CSF: VDRL Quant, CSF: NONREACTIVE

## 2021-12-11 MED ORDER — POTASSIUM CHLORIDE 10 MEQ/100ML IV SOLN
10.0000 meq | INTRAVENOUS | Status: AC
  Administered 2021-12-11 (×4): 10 meq via INTRAVENOUS
  Filled 2021-12-11 (×4): qty 100

## 2021-12-11 MED ORDER — POTASSIUM CHLORIDE CRYS ER 20 MEQ PO TBCR
40.0000 meq | EXTENDED_RELEASE_TABLET | Freq: Once | ORAL | Status: AC
  Administered 2021-12-11: 40 meq via ORAL
  Filled 2021-12-11: qty 2

## 2021-12-11 MED ORDER — NICOTINE 21 MG/24HR TD PT24
21.0000 mg | MEDICATED_PATCH | Freq: Every day | TRANSDERMAL | Status: DC
  Administered 2021-12-11 – 2021-12-14 (×4): 21 mg via TRANSDERMAL
  Filled 2021-12-11 (×4): qty 1

## 2021-12-11 NOTE — Progress Notes (Signed)
  Transition of Care (TOC) Screening Note   Patient Details  Name: [name] Date of Birth: 1965-07-31   Transition of Care [facility]) CM/SW Contact:    Quin Hoop, LCSW Phone Number: 12/11/2021, 8:58 AM    Transition of Care Department [facility]) has reviewed patient and no TOC needs have been identified at this time. We will continue to monitor patient advancement through interdisciplinary progression rounds. If new patient transition needs arise, please place a TOC consult.  [place], [place]

## 2021-12-11 NOTE — Hospital Course (Addendum)
[name] is a 56 y.o. female  with medical history significant for Hypertension, type 2 diabetes depression, prior stroke with right-sided deficits, non ambulant, dependent on a motorized wheelchair , class III obesity, COPD, chronic pain on buprenorphine film, depression on multiple psychoactive pills, nicotine dependence, ventral hernias s/p repair who presented to the ED with altered mental status.  Reportedly, she had stopped eating and drinking about 3 days prior to admission and vomited on the day of admission.  She became more confused so she was brought to the ED for further evaluation.  11/27-11/28: treated with empiric antimicrobial therapy for suspected meningitis. She underwent lumbar puncture and CSF analysis did not show evidence of meningitis. Urinalysis was abnormal and urine culture showed E. coli continue IV ceftriaxone was continued for suspected UTI.  11/29-11/30: completed ceftriaxone, pt mentally/cognitively intact. Unable to transfer self d/t weakness, refusing SNF, will need [facility] lift and EMS transport. Stable to d/c then was declining EMS home, and then declining to discharge, appealing  12/01: reports ear pain and sore throat, no concerns on exam but will rx augmentin. She is now wanting SNF. See TOC notes.   Consultants:  none  Procedures: none      ASSESSMENT & PLAN:   Principal Problem:   Acute metabolic encephalopathy Active Problems:   High anion gap metabolic acidosis   COPD (chronic obstructive pulmonary disease) (HCC)   Anxiety and depression   Chronic hypoxemic respiratory failure (HCC)   Chronic pain syndrome   Hemiparesis of right dominant side as late effect of cerebrovascular disease (HCC)   Obesity, Class III, BMI 40-49.9 (morbid obesity) (HCC)   E. coli UTI   Hypokalemia  Acute metabolic encephalopathy: Improved. Likely d/t UTI  Meningitis has been ruled out.   Discontinue broad-spectrum antibiotics for meningitis.   No acute  abnormality on MRI brain. Has completed abx for UTI    E. coli UTI:  Completed IV ceftriaxone.     Ear pain Concern for infection, likely viral vs eustachian tube dysfunction Has completed ceftriaxone which would cover OM Will rx nasal spray  Hypokalemia:  Repleting  Repeat BMP and Mg   Lactic acidosis/metabolic acidosis:  Improved Monitor BMP   History of stroke,  uses motorized wheelchair to get around:  Continue Plavix   Other comorbidities include migraine, chronic pain syndrome on buprenorphine, anxiety, depression, COPD    DVT prophylaxis: SCD Pertinent IV fluids/nutrition: none Central lines / invasive devices: none  Code Status: FULL CODE   Disposition: inpatient TOC needs: pt refusing SNF, will need HH/DME Barriers to discharge / significant pending items: HH/DME, repeat potassium

## 2021-12-11 NOTE — TOC Progression Note (Addendum)
Transition of Care [facility]) - Progression Note    Patient Details  Name: [name] MRN: [id number] Date of Birth: 05-Mar-1965  Transition of Care [facility]) CM/SW [place], LCSW Phone Number: 12/11/2021, 3:55 PM  Clinical Narrative:    CSW met with pt to discuss discharge plan.  Patient declined SNF and HH.  Pt states that she would like the [employer] [facility].  CSW will reach out to [employer] to place order for [facility] for discharge.  4:12  Spoke with Langley Gauss, to order [employer] for pt.       Expected Discharge Plan and [place], [place]                                              Social Determinants of Health (SDOH) Interventions    Readmission Risk Interventions     No data to display

## 2021-12-11 NOTE — Evaluation (Addendum)
Physical Therapy Evaluation Patient Details Name: [name] MRN: [id number] DOB: 1965/03/15 Today's Date: 12/11/2021  History of Present Illness  56 yo female presnets with Acute metabolic encephalopathy. PMH includes HTN, COPD, depression, diabetes, prior CVA, and ventral hernias s/p repair.   Clinical Impression  Pt cleared for mobility by MD despite low potassium. Pt found supine in bed upon PT entry. Pt max assist x1 for supine to sit and required supervision for sit to supine. Lateral scoot to chair transfer max assist x2. Pt refused transfer halfway through and pushed herself back into the bed. Pt Mod assist x1 for rolling. Pt educated on importance of mobility and verbalized understanding of education. Pt would benefit from skilled physical therapy to address the listed deficits (see below) to increase independence with ADLs and safety. Current recommendation is SNF due to safety concerns and to return pt to PLOF.         Recommendations for follow up therapy are one component of a multi-disciplinary discharge planning process, led by the attending physician.  Recommendations may be updated based on patient status, additional functional criteria and insurance authorization.  Follow Up Recommendations Skilled nursing-short term rehab (<3 hours/day) Can patient physically be transported by private vehicle: No    Assistance Recommended at Discharge Frequent or constant Supervision/Assistance  Patient can return home with the following  A lot of help with walking and/or transfers;A lot of help with bathing/dressing/bathroom;Assistance with cooking/housework;Direct supervision/assist for medications management;Assist for transportation;Help with stairs or ramp for entrance    Equipment Recommendations Other (comment) (hoyer lift)  Recommendations for Other Services       Functional Status Assessment Patient has had a recent decline in their functional status and/or demonstrates  limited ability to make significant improvements in function in a reasonable and predictable amount of time     Precautions / Restrictions Precautions Precautions: Fall Restrictions Weight Bearing Restrictions: No      Mobility  Bed Mobility Overal bed mobility: Needs Assistance Bed Mobility: Supine to Sit, Sit to Supine, Rolling Rolling: Mod assist   Supine to sit: Max assist Sit to supine: Supervision   General bed mobility comments: max assist for leg management for supine to sit. pt supervision for sit to supine    Transfers Overall transfer level: Needs assistance Equipment used: None Transfers: Bed to chair/wheelchair/BSC            Lateral/Scoot Transfers: +2 physical assistance, Max assist General transfer comment: pt stopped halfway throught transfer and said she was done then pushed herself back into the bed    Ambulation/Gait                  Stairs            Wheelchair Mobility    Modified Rankin (Stroke Patients Only)       Balance Overall balance assessment: Needs assistance Sitting-balance support: Feet supported, Bilateral upper extremity supported Sitting balance-Leahy Scale: Fair                                       Pertinent Vitals/Pain Pain Assessment Pain Assessment: Faces Faces Pain Scale: Hurts a little bit    Home Living Family/patient expects to be discharged to:: Private residence Living Arrangements: Spouse/significant other Available Help at Discharge: Family;Available 24 hours/day Type of Home: Mobile home Home Access: Ramped entrance       Home Layout: One  level Home Equipment: Shower seat;Other (comment);Wheelchair - manual;Wheelchair - power (bed trapeze)      Prior Function Prior Level of Function : Needs assist       Physical Assist : Mobility (physical);ADLs (physical) Mobility (physical): Bed mobility;Transfers;Gait;Stairs ADLs (physical):  Feeding;Grooming;Bathing;Dressing;Toileting;IADLs Mobility Comments: husband assits with transfers ADLs Comments: husband assits with ADLs and IADLs     Hand Dominance   Dominant Hand: Right    Extremity/Trunk Assessment   Upper Extremity Assessment Upper Extremity Assessment: Generalized weakness    Lower Extremity Assessment Lower Extremity Assessment: Generalized weakness    Cervical / Trunk Assessment Cervical / Trunk Assessment: Normal  Communication   Communication: No difficulties  Cognition Arousal/Alertness: Awake/alert Behavior During Therapy: WFL for tasks assessed/performed Overall Cognitive Status: Within Functional Limits for tasks assessed                                          General Comments      Exercises     Assessment/Plan    PT Assessment Patient needs continued PT services  PT Problem List Decreased strength;Decreased balance;Decreased range of motion;Decreased mobility;Decreased activity tolerance       PT Treatment Interventions DME instruction;Functional mobility training;Balance training;Patient/family education;Gait training;Therapeutic activities;Neuromuscular re-education;Stair training;Therapeutic exercise    PT Goals (Current goals can be found in the Care Plan section)  Acute Rehab PT Goals Patient Stated Goal: to return home PT Goal Formulation: With patient Time For Goal Achievement: 12/25/21 Potential to Achieve Goals: Fair    Frequency Min 2X/week     Co-evaluation               AM-PAC PT "6 Clicks" Mobility  Outcome Measure Help needed turning from your back to your side while in a flat bed without using bedrails?: A Lot Help needed moving from lying on your back to sitting on the side of a flat bed without using bedrails?: Total Help needed moving to and from a bed to a chair (including a wheelchair)?: Total Help needed standing up from a chair using your arms (e.g., wheelchair or bedside  chair)?: Total Help needed to walk in hospital room?: Total Help needed climbing 3-5 steps with a railing? : Total 6 Click Score: 7    End of Session Equipment Utilized During Treatment: Gait belt Activity Tolerance: Other (comment) (limited to pt anger and frustration) Patient left: in bed;with bed alarm set;with call bell/phone within reach;with family/visitor present Nurse Communication: Mobility status PT Visit Diagnosis: Unsteadiness on feet (R26.81);Other abnormalities of gait and mobility (R26.89);History of falling (Z91.81);Muscle weakness (generalized) (M62.81);Difficulty in walking, not elsewhere classified (R26.2);Other symptoms and signs involving the nervous system (R29.898)    Time: 1322-1401 PT Time Calculation (min) (ACUTE ONLY): 39 min   Charges:   PT Evaluation $PT Eval Moderate Complexity: 1 Mod PT Treatments $Therapeutic Activity: 23-37 mins        [employer], SPT  12/11/2021, 3:53 PM

## 2021-12-11 NOTE — Progress Notes (Signed)
PROGRESS NOTE    ALASKA FLETT   MVE:[id number] DOB: 1965-08-30  DOA: 12/08/2021 Date of Service: 12/11/21 PCP: Olin Hauser, DO     Brief Narrative / Hospital Course:  [name] is a 56 y.o. female  with medical history significant for Hypertension, type 2 diabetes depression, prior stroke with right-sided deficits, non ambulant, dependent on a motorized wheelchair , class III obesity, COPD, chronic pain on buprenorphine film, depression on multiple psychoactive pills, nicotine dependence, ventral hernias s/p repair who presented to the ED with altered mental status.  Reportedly, she had stopped eating and drinking about 3 days prior to admission and vomited on the day of admission.  She became more confused so she was brought to the ED for further evaluation.  11/27-11/28: treated with empiric antimicrobial therapy for suspected meningitis. She underwent lumbar puncture and CSF analysis did not show evidence of meningitis. Urinalysis was abnormal and urine culture showed E. coli continue IV ceftriaxone was continued for suspected UTI.  11/29: completed ceftriaxone, pt mentally/cognitively intact. Unable to transfer self d/t weakness, refusing SNF, will need [facility] lift and EMS transport.   Consultants:  none  Procedures: none      ASSESSMENT & PLAN:   Principal Problem:   Acute metabolic encephalopathy Active Problems:   High anion gap metabolic acidosis   COPD (chronic obstructive pulmonary disease) (HCC)   Anxiety and depression   Chronic hypoxemic respiratory failure (HCC)   Chronic pain syndrome   Hemiparesis of right dominant side as late effect of cerebrovascular disease (HCC)   Obesity, Class III, BMI 40-49.9 (morbid obesity) (HCC)   E. coli UTI   Hypokalemia  Acute metabolic encephalopathy: Improved. Likely d/t UTI  Meningitis has been ruled out.   Discontinue broad-spectrum antibiotics for meningitis.   No acute abnormality on MRI brain. Has  completed abx for UTI    E. coli UTI:  Completed IV ceftriaxone.     Hypokalemia:  Repleting  Repeat BMP and Mg   Lactic acidosis/metabolic acidosis:  Improved Monitor BMP   History of stroke,  uses motorized wheelchair to get around:  Continue Plavix   Other comorbidities include migraine, chronic pain syndrome on buprenorphine, anxiety, depression, COPD    DVT prophylaxis: SCD Pertinent IV fluids/nutrition: none Central lines / invasive devices: none  Code Status: FULL CODE   Disposition: inpatient TOC needs: pt refusing SNF, will need HH/DME Barriers to discharge / significant pending items: HH/DME, repeat potassium              Subjective:  Patient reports no concerns other than feeling weak.  Denies CP/SOB.  Pain controlled.  Denies new focal weakness.  Tolerating diet.  Reports no concerns w/ urination/defecation.   Family Communication: husband on speaker phone on rounds     Objective Findings:  Vitals:   12/11/21 0535 12/11/21 0554 12/11/21 0746 12/11/21 1132  BP:   (!) 110/59 112/61  Pulse:   85 89  Resp:   16 16  Temp:   98.4 F (36.9 C) 98.4 F (36.9 C)  TempSrc:   Oral Oral  SpO2:   95% 95%  Weight: 108.6 kg     Height:  '5\' 6"'$  (1.676 m)      Intake/Output Summary (Last 24 hours) at 12/11/2021 1422 Last data filed at 12/11/2021 0519 Gross per 24 hour  Intake --  Output 650 ml  Net -650 ml   Filed Weights   12/08/21 1938 12/11/21 0535  Weight: 112.4 kg 108.6  kg    Examination:  Constitutional:  VS as above General Appearance: alert, well-developed, well-nourished, NAD Respiratory: Normal respiratory effort No wheeze Cardiovascular: S1/S2 normal No murmur No rub/gallop auscultated No lower extremity edema Gastrointestinal: No tenderness Musculoskeletal:  No clubbing/cyanosis of digits Neurological: No cranial nerve deficit on limited exam Alert Psychiatric: Normal judgment/insight Normal mood and  affect       Scheduled Medications:   atorvastatin  40 mg Oral QHS   buprenorphine-naloxone  1 tablet Sublingual Daily   buPROPion  150 mg Oral Daily   citalopram  40 mg Oral Daily   clopidogrel  75 mg Oral Daily   fluticasone furoate-vilanterol  1 puff Inhalation Daily   And   umeclidinium bromide  1 puff Inhalation Daily   gabapentin  600 mg Oral TID   insulin aspart  0-20 Units Subcutaneous Q4H   lidocaine (PF)  5 mL Intradermal Once   pantoprazole  40 mg Oral Daily   QUEtiapine  100 mg Oral QHS   topiramate  100 mg Oral BID   Vitamin D (Ergocalciferol)  50,000 Units Oral Q7 days    Continuous Infusions:  sodium chloride      PRN Medications:  acetaminophen **OR** acetaminophen, albuterol, ipratropium-albuterol, ondansetron **OR** ondansetron (ZOFRAN) IV  Antimicrobials:  Anti-infectives (From admission, onward)    Start     Dose/Rate Route Frequency Ordered Stop   12/11/21 1000  cefTRIAXone (ROCEPHIN) 1 g in sodium chloride 0.9 % 100 mL IVPB  Status:  Discontinued        1 g 200 mL/hr over 30 Minutes Intravenous Daily 12/10/21 0948 12/11/21 1026   12/10/21 0600  acyclovir (ZOVIRAX) 800 mg in dextrose 5 % 250 mL IVPB  Status:  Discontinued        800 mg 266 mL/hr over 60 Minutes Intravenous Every 8 hours 12/10/21 0220 12/10/21 0946   12/10/21 0200  acyclovir (ZOVIRAX) 800 mg in dextrose 5 % 250 mL IVPB  Status:  Discontinued        800 mg 266 mL/hr over 60 Minutes Intravenous Every 8 hours 12/09/21 1701 12/10/21 0219   12/09/21 1630  vancomycin (VANCOCIN) IVPB 1000 mg/200 mL premix  Status:  Discontinued        1,000 mg 200 mL/hr over 60 Minutes Intravenous Every 12 hours 12/09/21 0211 12/10/21 0946   12/09/21 1000  acyclovir (ZOVIRAX) 595 mg in dextrose 5 % 100 mL IVPB  Status:  Discontinued        10 mg/kg  59.3 kg (Ideal) 111.9 mL/hr over 60 Minutes Intravenous Every 8 hours 12/09/21 0201 12/10/21 0159   12/09/21 0300  vancomycin (VANCOCIN) IVPB 1000 mg/200  mL premix  Status:  Discontinued        1,000 mg 200 mL/hr over 60 Minutes Intravenous Every 12 hours 12/09/21 0207 12/09/21 0211   12/09/21 0215  vancomycin (VANCOCIN) IVPB 1000 mg/200 mL premix       See Hyperspace for full Linked Orders Report.   1,000 mg 200 mL/hr over 60 Minutes Intravenous  Once 12/09/21 0201 12/09/21 0536   12/09/21 0215  vancomycin (VANCOREADY) IVPB 1500 mg/300 mL       See Hyperspace for full Linked Orders Report.   1,500 mg 150 mL/hr over 120 Minutes Intravenous  Once 12/09/21 0201 12/09/21 0805   12/09/21 0200  cefTRIAXone (ROCEPHIN) 2 g in sodium chloride 0.9 % 100 mL IVPB  Status:  Discontinued        2 g 200 mL/hr over 30  Minutes Intravenous Every 12 hours 12/09/21 0148 12/10/21 0946   12/09/21 0200  vancomycin (VANCOCIN) IVPB 1000 mg/200 mL premix  Status:  Discontinued        1,000 mg 200 mL/hr over 60 Minutes Intravenous  Once 12/09/21 0148 12/09/21 0200   12/09/21 0200  ampicillin (OMNIPEN) 2 g in sodium chloride 0.9 % 100 mL IVPB  Status:  Discontinued        2 g 300 mL/hr over 20 Minutes Intravenous  Once 12/09/21 0148 12/09/21 0158   12/09/21 0200  acyclovir (ZOVIRAX) 595 mg in dextrose 5 % 100 mL IVPB  Status:  Discontinued        10 mg/kg  59.3 kg (Ideal) 111.9 mL/hr over 60 Minutes Intravenous Every 8 hours 12/09/21 0148 12/09/21 0201           Data Reviewed: I have personally reviewed following labs and imaging studies  CBC: Recent Labs  Lab 12/08/21 1944 12/09/21 0430 12/11/21 0644  WBC 12.7* 14.0* 8.7  NEUTROABS 9.8*  --  4.9  HGB 15.1* 14.0 13.7  HCT 46.3* 41.4 42.0  MCV 81.4 81.3 81.6  PLT 383 396 818   Basic Metabolic Panel: Recent Labs  Lab 12/08/21 1944 12/09/21 0430 12/10/21 0740 12/11/21 0644  NA 133* 134*  --  139  K 3.7 3.2*  --  2.5*  CL 100 101  --  103  CO2 17* 20*  --  26  GLUCOSE 150* 152*  --  119*  BUN 19 18  --  12  CREATININE 0.74 0.70 0.62 0.71  CALCIUM 9.3 8.9  --  8.9  MG  --   --   --  2.1    GFR: Estimated Creatinine Clearance: 95.6 mL/min (by C-G formula based on SCr of 0.71 mg/dL). Liver Function Tests: Recent Labs  Lab 12/08/21 1944 12/09/21 0430  AST 20 15  ALT 12 9  ALKPHOS 83 70  BILITOT 1.1 0.9  PROT 8.0 6.9  ALBUMIN 3.8 3.3*   No results for input(s): "LIPASE", "AMYLASE" in the last 168 hours. No results for input(s): "AMMONIA" in the last 168 hours. Coagulation Profile: Recent Labs  Lab 12/09/21 0430  INR 1.3*   Cardiac Enzymes: No results for input(s): "CKTOTAL", "CKMB", "CKMBINDEX", "TROPONINI" in the last 168 hours. BNP (last 3 results) No results for input(s): "PROBNP" in the last 8760 hours. HbA1C: Recent Labs    12/09/21 0430  HGBA1C 6.7*   CBG: Recent Labs  Lab 12/10/21 2006 12/10/21 2351 12/11/21 0503 12/11/21 0744 12/11/21 1129  GLUCAP 101* 124* 124* 119* 182*   Lipid Profile: No results for input(s): "CHOL", "HDL", "LDLCALC", "TRIG", "CHOLHDL", "LDLDIRECT" in the last 72 hours. Thyroid Function Tests: Recent Labs    12/08/21 1944  TSH 0.921   Anemia Panel: No results for input(s): "VITAMINB12", "FOLATE", "FERRITIN", "TIBC", "IRON", "RETICCTPCT" in the last 72 hours. Most Recent Urinalysis On File:     Component Value Date/Time   COLORURINE YELLOW (A) 12/08/2021 2343   APPEARANCEUR HAZY (A) 12/08/2021 2343   APPEARANCEUR Clear 09/23/2013 0154   LABSPEC >1.046 (H) 12/08/2021 2343   LABSPEC 1.030 09/23/2013 0154   PHURINE 5.0 12/08/2021 2343   GLUCOSEU NEGATIVE 12/08/2021 2343   GLUCOSEU >=500 09/23/2013 0154   HGBUR NEGATIVE 12/08/2021 [place] 12/08/2021 2343   BILIRUBINUR Negative 09/23/2013 0154   KETONESUR 80 (A) 12/08/2021 2343   PROTEINUR 30 (A) 12/08/2021 2343   UROBILINOGEN 0.2 03/11/2012 1234   NITRITE NEGATIVE 12/08/2021  [place] (A) 12/08/2021 2343   LEUKOCYTESUR Negative 09/23/2013 0154   Sepsis Labs: '@LABRCNTIP'$ (procalcitonin:4,lacticidven:4)  Recent Results  (from the past 240 hour(s))  Resp Panel by RT-PCR (Flu A&B, Covid) Anterior Nasal Swab     Status: None   Collection Time: 12/08/21  7:20 PM   Specimen: Anterior Nasal Swab  Result Value Ref Range Status   SARS Coronavirus 2 by RT PCR NEGATIVE NEGATIVE Final    Comment: (NOTE) SARS-CoV-2 target nucleic acids are NOT DETECTED.  The SARS-CoV-2 RNA is generally detectable in upper respiratory specimens during the acute phase of infection. The lowest concentration of SARS-CoV-2 viral copies this assay can detect is 138 copies/mL. A negative result does not preclude SARS-Cov-2 infection and should not be used as the sole basis for treatment or other patient management decisions. A negative result may occur with  improper specimen collection/handling, submission of specimen other than nasopharyngeal swab, presence of viral mutation(s) within the areas targeted by this assay, and inadequate number of viral copies(<138 copies/mL). A negative result must be combined with clinical observations, patient history, and epidemiological information. The expected result is Negative.  Fact Sheet for Patients:  EntrepreneurPulse.com.au  Fact Sheet for Healthcare Providers:  IncredibleEmployment.be  This test is no t yet approved or cleared by the [place] FDA and  has been authorized for detection and/or diagnosis of SARS-CoV-2 by FDA under an Emergency Use Authorization (EUA). This EUA will remain  in effect (meaning this test can be used) for the duration of the COVID-19 declaration under Section 564(b)(1) of the Act, 21 U.S.C.section 360bbb-3(b)(1), unless the authorization is terminated  or revoked sooner.       Influenza A by PCR NEGATIVE NEGATIVE Final   Influenza B by PCR NEGATIVE NEGATIVE Final    Comment: (NOTE) The Xpert Xpress SARS-CoV-2/FLU/RSV plus assay is intended as an aid in the diagnosis of influenza from Nasopharyngeal swab specimens  and should not be used as a sole basis for treatment. Nasal washings and aspirates are unacceptable for Xpert Xpress SARS-CoV-2/FLU/RSV testing.  Fact Sheet for Patients: EntrepreneurPulse.com.au  Fact Sheet for Healthcare Providers: IncredibleEmployment.be  This test is not yet approved or cleared by the [place] FDA and has been authorized for detection and/or diagnosis of SARS-CoV-2 by FDA under an Emergency Use Authorization (EUA). This EUA will remain in effect (meaning this test can be used) for the duration of the COVID-19 declaration under Section 564(b)(1) of the Act, 21 U.S.C. section 360bbb-3(b)(1), unless the authorization is terminated or revoked.  Performed at [facility], [address] [place], [place] [address]   Blood Culture (routine x 2)     Status: None (Preliminary result)   Collection Time: 12/08/21  7:44 PM   Specimen: BLOOD LEFT ARM  Result Value Ref Range Status   Specimen Description BLOOD LEFT ARM  Final   Special Requests   Final    BOTTLES DRAWN AEROBIC AND ANAEROBIC Blood Culture results may not be optimal due to an inadequate volume of blood received in culture bottles   Culture   Final    NO GROWTH 3 DAYS Performed at [facility], [address] [place], [place] [address]    Report Status PENDING  Incomplete  Blood Culture (routine x 2)     Status: None (Preliminary result)   Collection Time: 12/08/21  7:44 PM   Specimen: BLOOD RIGHT ARM  Result Value Ref Range Status   Specimen Description BLOOD RIGHT ARM  Final   Special Requests   Final    BOTTLES DRAWN AEROBIC AND ANAEROBIC Blood Culture results may not be optimal due to an inadequate volume of blood received in culture bottles   Culture   Final    NO GROWTH 3 DAYS Performed at [facility], [address] [place], [place] [address]    Report Status PENDING  Incomplete  Urine Culture     Status: Abnormal    Collection Time: 12/08/21 11:43 PM   Specimen: In/Out Cath Urine  Result Value Ref Range Status   Specimen Description   Final    IN/OUT CATH URINE Performed at [facility], [address] [place], [place] [address]    Special Requests   Final    NONE Performed at [facility], [address] [place], [place] [address]    Culture 20,000 COLONIES/mL ESCHERICHIA COLI (A)  Final   Report Status 12/11/2021 FINAL  Final   Organism ID, Bacteria ESCHERICHIA COLI (A)  Final      Susceptibility   Escherichia coli - MIC*    AMPICILLIN >=32 RESISTANT Resistant     CEFAZOLIN <=4 SENSITIVE Sensitive     CEFEPIME <=0.12 SENSITIVE Sensitive     CEFTRIAXONE <=0.25 SENSITIVE Sensitive     CIPROFLOXACIN >=4 RESISTANT Resistant     GENTAMICIN <=1 SENSITIVE Sensitive     IMIPENEM <=0.25 SENSITIVE Sensitive     NITROFURANTOIN <=16 SENSITIVE Sensitive     TRIMETH/SULFA <=20 SENSITIVE Sensitive     AMPICILLIN/SULBACTAM 16 INTERMEDIATE Intermediate     PIP/TAZO <=4 SENSITIVE Sensitive     * 20,000 COLONIES/mL ESCHERICHIA COLI  CSF culture w Gram Stain     Status: None (Preliminary result)   Collection Time: 12/09/21  4:40 PM   Specimen: CSF; Cerebrospinal Fluid  Result Value Ref Range Status   Specimen Description   Final    CSF Performed at [facility], [address], [place], [place] [address]    Special Requests   Final    NONE Performed at [facility], [address] [place], [place] [address]    Gram Stain   Final    WBC SEEN NO ORGANISMS SEEN RED BLOOD CELLS Performed at [facility], [address] [place], [place] [address]    Culture   Final    NO GROWTH 1 DAY Performed at [facility], [place] [address], [place], [place] [address]    Report Status PENDING  Incomplete         Radiology Studies: IR FL GUIDED LOC OF NEEDLE/CATH TIP FOR SPINAL INJECT RT  Result Date: 12/10/2021 CLINICAL DATA:  Altered mental  status EXAM: DIAGNOSTIC LUMBAR PUNCTURE UNDER FLUOROSCOPIC GUIDANCE COMPARISON:  None Available. FLUOROSCOPY: Radiation Exposure Index (as provided by the fluoroscopic device): 0.6 minutes (13 mGy) PROCEDURE: Informed consent was obtained from the patient prior to the procedure, including potential complications of headache, allergy, and pain. With the patient in the lateral decubitus position, the lower back was prepped with chlorhexidine. 1% Lidocaine was used for local anesthesia. Lumbar puncture was performed at the L5-S1 level using a 20 gauge needle with return of clear, colorless CSF with an opening pressure of 26 cm H2O. Approximately 20 ml of CSF were obtained for laboratory studies. Closing pressure was estimated at 14 cm H2O. The patient tolerated the procedure well and there were no apparent complications. IMPRESSION: Successful lumbar puncture using fluoroscopic guidance. Sample of clear, colorless CSF was sent to  the lab for analysis. Opening pressure was estimated at 26 cm H2O, with a closing pressure of 14 cm H2O. Electronically Signed   By: Albin Felling M.D.   On: 12/10/2021 08:40   IR FL GUIDED LOC OF NEEDL/CATH TIP FOR SPINAL INJECT LT  Result Date: 12/09/2021 CLINICAL DATA:  Altered mental status EXAM: Attempted lumbar puncture using fluoroscopy COMPARISON:  None Available. FLUOROSCOPY: Radiation Exposure Index (as provided by the fluoroscopic device): 1.2 minutes (32 mGy) PROCEDURE: Attempts at positioning of the patient on the procedure table in both the prone and lateral decubitus positions were unsuccessful due to patient's inability to follow instructions. Procedure aborted. IMPRESSION: Attempted lumbar puncture aborted due to patient's inability to follow instructions and safety concerns. Patient will be rescheduled with sedation. Electronically Signed   By: Albin Felling M.D.   On: 12/09/2021 13:31   MR BRAIN WO CONTRAST  Result Date: 12/09/2021 CLINICAL DATA:  Altered mental  status EXAM: MRI HEAD WITHOUT CONTRAST TECHNIQUE: Multiplanar, multiecho pulse sequences of the brain and surrounding structures were obtained without intravenous contrast. COMPARISON:  04/07/2017 MRI, correlation is also made with CT head 12/08/2021 FINDINGS: Evaluation is significantly limited by motion artifact. In addition, the study could not be completed due to patient pain and motion. Brain: No restricted diffusion to suggest acute or subacute infarct. No definite acute hemorrhage, mass, mass effect, or midline shift. No hydrocephalus or definite extra-axial collection. Lacunar infarcts in the left basal ganglia and pons. Vascular: Grossly normal arterial flow voids. Skull and upper cervical spine: Unable to evaluate. Sinuses/Orbits: Clear paranasal sinuses. No acute finding in the orbits. Other: None. IMPRESSION: Evaluation is limited by motion artifact and early termination. Within this limitation, no acute intracranial process. No evidence of acute or subacute infarct. Electronically Signed   By: Merilyn Baba M.D.   On: 12/09/2021 01:58   CT Abdomen Pelvis W Contrast  Result Date: 12/08/2021 CLINICAL DATA:  Abdominal pain. EXAM: CT ABDOMEN AND PELVIS WITH CONTRAST TECHNIQUE: Multidetector CT imaging of the abdomen and pelvis was performed using the standard protocol following bolus administration of intravenous contrast. RADIATION DOSE REDUCTION: This exam was performed according to the departmental dose-optimization program which includes automated exposure control, adjustment of the mA and/or kV according to patient size and/or use of iterative reconstruction technique. CONTRAST:  121m OMNIPAQUE IOHEXOL 350 MG/ML SOLN COMPARISON:  February 21, 2021 FINDINGS: Lower chest: No acute abnormality. Hepatobiliary: No focal liver abnormality is seen. Mild, stable central intrahepatic biliary dilatation is noted. The gallbladder is moderately distended. No gallstones, gallbladder wall thickening, or  extrahepatic biliary dilatation. Pancreas: Unremarkable. No pancreatic ductal dilatation or surrounding inflammatory changes. Spleen: Normal in size without focal abnormality. Adrenals/Urinary Tract: Adrenal glands are unremarkable. Kidneys are normal, without renal calculi, focal lesion, or hydronephrosis. Bladder is unremarkable. Stomach/Bowel: Stomach is within normal limits. The appendix is not clearly identified. No evidence of bowel wall thickening, distention, or inflammatory changes. Noninflamed diverticula are seen throughout the sigmoid colon. Vascular/Lymphatic: Aortic atherosclerosis. No enlarged abdominal or pelvic lymph nodes. Reproductive: Status post hysterectomy. No adnexal masses. Other: A stable 3.0 cm x 5.3 cm ventral hernia is seen along the anterior abdominal wall. An additional stable 2.1 cm x 5.3 cm ventral hernia is seen along the anterior pelvic wall. No abdominopelvic ascites. Musculoskeletal: Postoperative changes are seen at the levels of L4 and L5. IMPRESSION: 1. Sigmoid diverticulosis. 2. Stable ventral hernias along the anterior abdominal wall and anterior pelvic wall. 3. Postoperative changes at the levels of  L4 and L5. 4. Aortic atherosclerosis. Aortic Atherosclerosis (ICD10-I70.0). Electronically Signed   By: Virgina Norfolk M.D.   On: 12/08/2021 23:22   CT Head Wo Contrast  Result Date: 12/08/2021 CLINICAL DATA:  Altered mental status. EXAM: CT HEAD WITHOUT CONTRAST TECHNIQUE: Contiguous axial images were obtained from the base of the skull through the vertex without intravenous contrast. RADIATION DOSE REDUCTION: This exam was performed according to the departmental dose-optimization program which includes automated exposure control, adjustment of the mA and/or kV according to patient size and/or use of iterative reconstruction technique. COMPARISON:  April 07, 2017 FINDINGS: Brain: There is mild cerebral atrophy with widening of the extra-axial spaces and ventricular  dilatation. There are areas of decreased attenuation within the white matter tracts of the supratentorial brain, consistent with microvascular disease changes. Chronic left basal ganglia lacunar infarcts are seen. Vascular: No hyperdense vessel or unexpected calcification. Skull: Normal. Negative for fracture or focal lesion. Sinuses/Orbits: No acute finding. Other: None. IMPRESSION: 1. No acute intracranial abnormality. 2. Generalized cerebral atrophy and microvascular disease changes of the supratentorial brain. 3. Chronic left basal ganglia lacunar infarcts. Electronically Signed   By: Virgina Norfolk M.D.   On: 12/08/2021 22:01   DG Chest Port 1 View  Result Date: 12/08/2021 CLINICAL DATA:  Questionable sepsis EXAM: PORTABLE CHEST - 1 VIEW COMPARISON:  06/12/2020 FINDINGS: Coarse perihilar interstitial opacities, most marked in the left lower lung as before. Heart size and mediastinal contours are within normal limits. No effusion. Cervical fixation hardware noted.  Bilateral shoulder DJD. IMPRESSION: Persistent coarse perihilar interstitial opacities, most marked in the left lower lung. Electronically Signed   By: Lucrezia Europe M.D.   On: 12/08/2021 19:43            LOS: 2 days       Emeterio Reeve, DO [facility] 12/11/2021, 2:22 PM    Dictation software may have been used to generate the above note. Typos may occur and escape review in typed/dictated notes. Please contact Dr Sheppard Coil directly for clarity if needed.  Staff may message me via secure chat in [place]  but this may not receive an immediate response,  please page me for urgent matters!  If 7PM-7AM, please contact night coverage [email or web address]

## 2021-12-11 NOTE — Progress Notes (Signed)
OT Cancellation Note  Patient Details Name: [name] MRN: [id number] DOB: 1965-06-12   Cancelled Treatment:    Reason Eval/Treat Not Completed: Fatigue/lethargy limiting ability to participate;Other (comment). OT orders received, chart reviewed. Pt reporting difficult time during PT eval when trying to complete lateral scoot transfer to [facility]. Spouse and son present. Pt declining to work with therapy at this time due to fatigue and wanting to visit with son she has not seen in 4 months. Will re-attempt evaluation tomorrow.  Doneta Public 12/11/2021, 4:09 PM

## 2021-12-12 DIAGNOSIS — G9341 Metabolic encephalopathy: Secondary | ICD-10-CM | POA: Diagnosis not present

## 2021-12-12 DIAGNOSIS — J449 Chronic obstructive pulmonary disease, unspecified: Secondary | ICD-10-CM | POA: Diagnosis not present

## 2021-12-12 DIAGNOSIS — E8729 Other acidosis: Secondary | ICD-10-CM | POA: Diagnosis not present

## 2021-12-12 DIAGNOSIS — I69951 Hemiplegia and hemiparesis following unspecified cerebrovascular disease affecting right dominant side: Secondary | ICD-10-CM | POA: Diagnosis not present

## 2021-12-12 LAB — GLUCOSE, CAPILLARY
Glucose-Capillary: 117 mg/dL — ABNORMAL HIGH (ref 70–99)
Glucose-Capillary: 126 mg/dL — ABNORMAL HIGH (ref 70–99)
Glucose-Capillary: 131 mg/dL — ABNORMAL HIGH (ref 70–99)
Glucose-Capillary: 135 mg/dL — ABNORMAL HIGH (ref 70–99)
Glucose-Capillary: 182 mg/dL — ABNORMAL HIGH (ref 70–99)
Glucose-Capillary: 227 mg/dL — ABNORMAL HIGH (ref 70–99)
Glucose-Capillary: 91 mg/dL (ref 70–99)

## 2021-12-12 NOTE — Progress Notes (Signed)
Patient with concerns of feeling "off" and taste being disturbed. Patient also expresses request to be put back on percocet vs suboxone.

## 2021-12-12 NOTE — TOC Progression Note (Addendum)
Transition of Care [facility]) - Progression Note    Patient Details  Name: [name] MRN: [id number] Date of Birth: 03-12-1965  Transition of Care [facility]) CM/SW [place], LCSW Phone Number: 12/12/2021, 11:20 AM  Clinical Narrative:     OT placed request for hospital bed and pt is in agreement.  Narrative placed and signed.  Erasmo Downer with [employer] notified of DME request.  2:30pm  CSW rec'd text from [place], [employer], stating that pt wanted to hold off on getting equipment until she's ready.  CSW visited pt in room to discuss.  Pt states that she still wants the bed and Hoyer lift, but it's going to be best to have the equipment delivered to her home tomorrow, 12/1.  This information was relayed to Erasmo Downer and she has agreed.    Ocie Bob, [place]        Expected Discharge Plan and Services           Expected Discharge Date: 12/12/21                                     Social Determinants of Health (SDOH) Interventions    Readmission Risk Interventions     No data to display

## 2021-12-12 NOTE — Discharge Summary (Signed)
Physician Discharge Summary   Patient: [name] MRN: [id number]  DOB: 1966/01/01   Admit:     Date of Admission: 12/08/2021 Admitted from: home   Discharge: Date of discharge: 12/12/21 Disposition: Home - declined HH/SNF-Rehab Condition at discharge: fair  CODE STATUS: FULL CODE     Discharge Physician: Emeterio Reeve, DO [facility]     PCP: Olin Hauser, DO  Recommendations for Outpatient Follow-up:  Follow up with PCP Parks Ranger, Devonne Doughty, DO in 1-2 weeks Please obtain labs/tests: CBC, BMP, UA in 1-2 weeks / as needed Please follow up on the following pending results: none   Discharge Instructions     Diet - low sodium heart healthy   Complete by: As directed    Diet Carb Modified   Complete by: As directed    Increase activity slowly   Complete by: As directed          Discharge Diagnoses: Principal Problem:   Acute metabolic encephalopathy Active Problems:   High anion gap metabolic acidosis   COPD (chronic obstructive pulmonary disease) (HCC)   Anxiety and depression   Chronic hypoxemic respiratory failure (HCC)   Chronic pain syndrome   Hemiparesis of right dominant side as late effect of cerebrovascular disease (HCC)   Obesity, Class III, BMI 40-49.9 (morbid obesity) ([place])   E. coli UTI   Hypokalemia       Hospital Course: [name] is a 56 y.o. female  with medical history significant for Hypertension, type 2 diabetes depression, prior stroke with right-sided deficits, non ambulant, dependent on a motorized wheelchair , class III obesity, COPD, chronic pain on buprenorphine film, depression on multiple psychoactive pills, nicotine dependence, ventral hernias s/p repair who presented to the ED with altered mental status.  Reportedly, she had stopped eating and drinking about 3 days prior to admission and vomited on the day of admission.  She became more confused so she was brought to the ED for further  evaluation.  11/27-11/28: treated with empiric antimicrobial therapy for suspected meningitis. She underwent lumbar puncture and CSF analysis did not show evidence of meningitis. Urinalysis was abnormal and urine culture showed E. coli continue IV ceftriaxone was continued for suspected UTI.  11/29: completed ceftriaxone, pt mentally/cognitively intact. Unable to transfer self d/t weakness, refusing SNF, will need [facility] lift and EMS transport.  11/30: DME arranged, pt refusing HH and EMS transport home. Stable medically for discharge.   Consultants:  none  Procedures: none      ASSESSMENT & PLAN:   Principal Problem:   Acute metabolic encephalopathy Active Problems:   High anion gap metabolic acidosis   COPD (chronic obstructive pulmonary disease) (HCC)   Anxiety and depression   Chronic hypoxemic respiratory failure (HCC)   Chronic pain syndrome   Hemiparesis of right dominant side as late effect of cerebrovascular disease (HCC)   Obesity, Class III, BMI 40-49.9 (morbid obesity) (HCC)   E. coli UTI   Hypokalemia  Acute metabolic encephalopathy: Improved. Likely d/t UTI  Meningitis has been ruled out.   Discontinue broad-spectrum antibiotics for meningitis.   No acute abnormality on MRI brain. Has completed abx for UTI    E. coli UTI:  Completed IV ceftriaxone.     Hypokalemia - resolved Repeat BMP outpatient    Lactic acidosis/metabolic acidosis:  Improved Monitor BMP as needed    History of stroke,  uses motorized wheelchair to get around:  Continue Plavix   Other comorbidities include migraine,  chronic pain syndrome on buprenorphine, anxiety, depression, COPD          Discharge Instructions  Allergies as of 12/12/2021       Reactions   Tramadol Nausea And Vomiting, Hypertension   Augmentin [amoxicillin-pot Clavulanate] Rash        Medication List     STOP taking these medications    fluconazole 150 MG tablet Commonly known as: DIFLUCAN    meclizine 25 MG tablet Commonly known as: ANTIVERT   nystatin powder Generic drug: nystatin   ondansetron 4 MG disintegrating tablet Commonly known as: ZOFRAN-ODT   pioglitazone 45 MG tablet Commonly known as: ACTOS   Relistor 150 MG Tabs Generic drug: Methylnaltrexone Bromide   triamcinolone cream 0.1 % Commonly known as: KENALOG       TAKE these medications    albuterol 108 (90 Base) MCG/ACT inhaler Commonly known as: VENTOLIN HFA USE 2 INHALATIONS BY MOUTH EVERY 4 HOURS AS NEEDED FOR WHEEZING  OR SHORTNESS OF BREATH   atorvastatin 40 MG tablet Commonly known as: LIPITOR TAKE 1 TABLET BY MOUTH AT  BEDTIME   azelastine 0.1 % nasal spray Commonly known as: ASTELIN Place 2 sprays into both nostrils 2 (two) times daily.   Buprenorphine HCl-Naloxone HCl 8-2 MG Film Place under the tongue 3 (three) times daily.   buPROPion 150 MG 24 hr tablet Commonly known as: WELLBUTRIN XL TAKE 1 TABLET BY MOUTH DAILY   citalopram 40 MG tablet Commonly known as: CELEXA TAKE 1 TABLET BY MOUTH DAILY   clopidogrel 75 MG tablet Commonly known as: PLAVIX TAKE 1 TABLET BY MOUTH DAILY   dicyclomine 20 MG tablet Commonly known as: BENTYL Take 1 tablet (20 mg total) by mouth 4 (four) times daily -  before meals and at bedtime. As needed for abdominal pain cramping   fluticasone 50 MCG/ACT nasal spray Commonly known as: FLONASE USE 2 SPRAYS IN BOTH NOSTRILS  DAILY USE FOR 4 TO 6 WEEKS, THEN STOP AND USE SEASONALLY OR AS  NEEDED   gabapentin 600 MG tablet Commonly known as: NEURONTIN TAKE 1 TABLET(600 MG) BY MOUTH FOUR TIMES DAILY   IBU 600 MG tablet Generic drug: ibuprofen Take 600 mg by mouth every 12 (twelve) hours as needed.   metFORMIN 1000 MG tablet Commonly known as: GLUCOPHAGE TAKE 1 TABLET(1000 MG) BY MOUTH TWICE DAILY WITH A MEAL   Mounjaro 5 MG/0.5ML Pen Generic drug: tirzepatide INJECT '5MG'$  UNDER THE SKIN ONCE A WEEK   pantoprazole 40 MG tablet Commonly known  as: PROTONIX TAKE 1 TABLET BY MOUTH DAILY   QUEtiapine 100 MG tablet Commonly known as: SEROQUEL TAKE 1 TABLET BY MOUTH AT  BEDTIME   topiramate 100 MG tablet Commonly known as: TOPAMAX TAKE 1 TABLET BY MOUTH TWICE  DAILY   Trelegy Ellipta 100-62.5-25 MCG/ACT Aepb Generic drug: Fluticasone-Umeclidin-Vilant INHALE 1 PUFF INTO THE LUNGS DAILY   Vitamin D (Ergocalciferol) 1.25 MG (50000 UNIT) Caps capsule Commonly known as: DRISDOL Take 50,000 Units by mouth every 7 (seven) days. On Wednesday               Durable Medical Equipment  (From admission, onward)           Start     Ordered   12/12/21 1111  For home use only DME Hospital bed  Once       Question Answer Comment  Length of Need Lifetime   Patient has (list medical condition): stroke w/ hemiparesis, debility   The above medical  condition requires: Patient requires the ability to reposition frequently   Bed type Semi-electric   Hoyer Lift Yes      12/12/21 1110   12/11/21 1419  For home use only DME Other see comment  Once       Comments: HOYER LIFT  Question:  Length of Need  Answer:  Lifetime   12/11/21 1418              Allergies  Allergen Reactions   Tramadol Nausea And Vomiting and Hypertension   Augmentin [Amoxicillin-Pot Clavulanate] Rash     Subjective: pt has no complaints at this time, reports pain is controlled, no CP/SOB   Discharge Exam: BP 100/71 (BP Location: Left Arm)   Pulse 76   Temp 97.9 F (36.6 C)   Resp 20   Ht '5\' 6"'$  (1.676 m)   Wt 108.6 kg   SpO2 100%   BMI 38.64 kg/m  General: Pt is alert, awake, not in acute distress Cardiovascular: RRR, S1/S2 +, no rubs, no gallops Respiratory: CTA bilaterally, no wheezing, no rhonchi Abdominal: Soft, NT, ND, bowel sounds + Extremities: no edema, no cyanosis     The results of significant diagnostics from this hospitalization (including imaging, microbiology, ancillary and laboratory) are listed below for reference.      Microbiology: Recent Results (from the past 240 hour(s))  Resp Panel by RT-PCR (Flu A&B, Covid) Anterior Nasal Swab     Status: None   Collection Time: 12/08/21  7:20 PM   Specimen: Anterior Nasal Swab  Result Value Ref Range Status   SARS Coronavirus 2 by RT PCR NEGATIVE NEGATIVE Final    Comment: (NOTE) SARS-CoV-2 target nucleic acids are NOT DETECTED.  The SARS-CoV-2 RNA is generally detectable in upper respiratory specimens during the acute phase of infection. The lowest concentration of SARS-CoV-2 viral copies this assay can detect is 138 copies/mL. A negative result does not preclude SARS-Cov-2 infection and should not be used as the sole basis for treatment or other patient management decisions. A negative result may occur with  improper specimen collection/handling, submission of specimen other than nasopharyngeal swab, presence of viral mutation(s) within the areas targeted by this assay, and inadequate number of viral copies(<138 copies/mL). A negative result must be combined with clinical observations, patient history, and epidemiological information. The expected result is Negative.  Fact Sheet for Patients:  EntrepreneurPulse.com.au  Fact Sheet for Healthcare Providers:  IncredibleEmployment.be  This test is no t yet approved or cleared by the [place] FDA and  has been authorized for detection and/or diagnosis of SARS-CoV-2 by FDA under an Emergency Use Authorization (EUA). This EUA will remain  in effect (meaning this test can be used) for the duration of the COVID-19 declaration under Section 564(b)(1) of the Act, 21 U.S.C.section 360bbb-3(b)(1), unless the authorization is terminated  or revoked sooner.       Influenza A by PCR NEGATIVE NEGATIVE Final   Influenza B by PCR NEGATIVE NEGATIVE Final    Comment: (NOTE) The Xpert Xpress SARS-CoV-2/FLU/RSV plus assay is intended as an aid in the diagnosis of influenza  from Nasopharyngeal swab specimens and should not be used as a sole basis for treatment. Nasal washings and aspirates are unacceptable for Xpert Xpress SARS-CoV-2/FLU/RSV testing.  Fact Sheet for Patients: EntrepreneurPulse.com.au  Fact Sheet for Healthcare Providers: IncredibleEmployment.be  This test is not yet approved or cleared by the [place] FDA and has been authorized for detection and/or diagnosis of SARS-CoV-2 by FDA under an Emergency  Use Authorization (EUA). This EUA will remain in effect (meaning this test can be used) for the duration of the COVID-19 declaration under Section 564(b)(1) of the Act, 21 U.S.C. section 360bbb-3(b)(1), unless the authorization is terminated or revoked.  Performed at [facility], [address], [place], [place] [address]   Blood Culture (routine x 2)     Status: None (Preliminary result)   Collection Time: 12/08/21  7:44 PM   Specimen: BLOOD LEFT ARM  Result Value Ref Range Status   Specimen Description BLOOD LEFT ARM  Final   Special Requests   Final    BOTTLES DRAWN AEROBIC AND ANAEROBIC Blood Culture results may not be optimal due to an inadequate volume of blood received in culture bottles   Culture   Final    NO GROWTH 4 DAYS Performed at [facility], [address] [place], [place] [address]    Report Status PENDING  Incomplete  Blood Culture (routine x 2)     Status: None (Preliminary result)   Collection Time: 12/08/21  7:44 PM   Specimen: BLOOD RIGHT ARM  Result Value Ref Range Status   Specimen Description BLOOD RIGHT ARM  Final   Special Requests   Final    BOTTLES DRAWN AEROBIC AND ANAEROBIC Blood Culture results may not be optimal due to an inadequate volume of blood received in culture bottles   Culture   Final    NO GROWTH 4 DAYS Performed at [facility], [address], [place], [place] [address]    Report Status PENDING  Incomplete  Urine  Culture     Status: Abnormal   Collection Time: 12/08/21 11:43 PM   Specimen: In/Out Cath Urine  Result Value Ref Range Status   Specimen Description   Final    IN/OUT CATH URINE Performed at [facility], [address] [place], [place] [address]    Special Requests   Final    NONE Performed at [facility], [address], [place], [place] [address]    Culture 20,000 COLONIES/mL ESCHERICHIA COLI (A)  Final   Report Status 12/11/2021 FINAL  Final   Organism ID, Bacteria ESCHERICHIA COLI (A)  Final      Susceptibility   Escherichia coli - MIC*    AMPICILLIN >=32 RESISTANT Resistant     CEFAZOLIN <=4 SENSITIVE Sensitive     CEFEPIME <=0.12 SENSITIVE Sensitive     CEFTRIAXONE <=0.25 SENSITIVE Sensitive     CIPROFLOXACIN >=4 RESISTANT Resistant     GENTAMICIN <=1 SENSITIVE Sensitive     IMIPENEM <=0.25 SENSITIVE Sensitive     NITROFURANTOIN <=16 SENSITIVE Sensitive     TRIMETH/SULFA <=20 SENSITIVE Sensitive     AMPICILLIN/SULBACTAM 16 INTERMEDIATE Intermediate     PIP/TAZO <=4 SENSITIVE Sensitive     * 20,000 COLONIES/mL ESCHERICHIA COLI  CSF culture w Gram Stain     Status: None (Preliminary result)   Collection Time: 12/09/21  4:40 PM   Specimen: CSF; Cerebrospinal Fluid  Result Value Ref Range Status   Specimen Description   Final    CSF Performed at [facility], [address], [place],  [address]    Special Requests   Final    NONE Performed at [facility], [address] [place],  [address]    Gram Stain   Final    WBC SEEN NO ORGANISMS SEEN RED BLOOD CELLS Performed at [facility], [address] [place],  [address]  Culture   Final    NO GROWTH 2 DAYS Performed at [facility], [place] [address] [place], [place] [address]    Report Status PENDING  Incomplete     Labs: BNP (last 3 results) No results for input(s): "BNP" in the last 8760 hours. Basic Metabolic Panel: Recent  Labs  Lab 12/08/21 1944 12/09/21 0430 12/10/21 0740 12/11/21 0644 12/11/21 1416  NA 133* 134*  --  139 137  K 3.7 3.2*  --  2.5* 3.6  CL 100 101  --  103 105  CO2 17* 20*  --  26 23  GLUCOSE 150* 152*  --  119* 130*  BUN 19 18  --  12 12  CREATININE 0.74 0.70 0.62 0.71 0.75  CALCIUM 9.3 8.9  --  8.9 8.6*  MG  --   --   --  2.1 2.0   Liver Function Tests: Recent Labs  Lab 12/08/21 1944 12/09/21 0430  AST 20 15  ALT 12 9  ALKPHOS 83 70  BILITOT 1.1 0.9  PROT 8.0 6.9  ALBUMIN 3.8 3.3*   No results for input(s): "LIPASE", "AMYLASE" in the last 168 hours. No results for input(s): "AMMONIA" in the last 168 hours. CBC: Recent Labs  Lab 12/08/21 1944 12/09/21 0430 12/11/21 0644  WBC 12.7* 14.0* 8.7  NEUTROABS 9.8*  --  4.9  HGB 15.1* 14.0 13.7  HCT 46.3* 41.4 42.0  MCV 81.4 81.3 81.6  PLT 383 396 314   Cardiac Enzymes: No results for input(s): "CKTOTAL", "CKMB", "CKMBINDEX", "TROPONINI" in the last 168 hours. BNP: Invalid input(s): "POCBNP" CBG: Recent Labs  Lab 12/11/21 1937 12/12/21 0002 12/12/21 0403 12/12/21 0902 12/12/21 1312  GLUCAP 148* 131* 117* 135* 227*   D-Dimer No results for input(s): "DDIMER" in the last 72 hours. Hgb A1c No results for input(s): "HGBA1C" in the last 72 hours. Lipid Profile No results for input(s): "CHOL", "HDL", "LDLCALC", "TRIG", "CHOLHDL", "LDLDIRECT" in the last 72 hours. Thyroid function studies No results for input(s): "TSH", "T4TOTAL", "T3FREE", "THYROIDAB" in the last 72 hours.  Invalid input(s): "FREET3" Anemia work up No results for input(s): "VITAMINB12", "FOLATE", "FERRITIN", "TIBC", "IRON", "RETICCTPCT" in the last 72 hours. Urinalysis    Component Value Date/Time   COLORURINE YELLOW (A) 12/08/2021 2343   APPEARANCEUR HAZY (A) 12/08/2021 2343   APPEARANCEUR Clear 09/23/2013 0154   LABSPEC >1.046 (H) 12/08/2021 2343   LABSPEC 1.030 09/23/2013 0154   PHURINE 5.0 12/08/2021 2343   GLUCOSEU NEGATIVE  12/08/2021 2343   GLUCOSEU >=500 09/23/2013 0154   HGBUR NEGATIVE 12/08/2021 2343   BILIRUBINUR NEGATIVE 12/08/2021 2343   BILIRUBINUR Negative 09/23/2013 0154   KETONESUR 80 (A) 12/08/2021 2343   PROTEINUR 30 (A) 12/08/2021 2343   UROBILINOGEN 0.2 03/11/2012 1234   NITRITE NEGATIVE 12/08/2021 2343   LEUKOCYTESUR TRACE (A) 12/08/2021 2343   LEUKOCYTESUR Negative 09/23/2013 0154   Sepsis Labs Recent Labs  Lab 12/08/21 1944 12/09/21 0430 12/11/21 0644  WBC 12.7* 14.0* 8.7   Microbiology Recent Results (from the past 240 hour(s))  Resp Panel by RT-PCR (Flu A&B, Covid) Anterior Nasal Swab     Status: None   Collection Time: 12/08/21  7:20 PM   Specimen: Anterior Nasal Swab  Result Value Ref Range Status   SARS Coronavirus 2 by RT PCR NEGATIVE NEGATIVE Final    Comment: (NOTE) SARS-CoV-2 target nucleic acids are NOT DETECTED.  The SARS-CoV-2 RNA is generally detectable in upper respiratory specimens during the acute phase of infection.  The lowest concentration of SARS-CoV-2 viral copies this assay can detect is 138 copies/mL. A negative result does not preclude SARS-Cov-2 infection and should not be used as the sole basis for treatment or other patient management decisions. A negative result may occur with  improper specimen collection/handling, submission of specimen other than nasopharyngeal swab, presence of viral mutation(s) within the areas targeted by this assay, and inadequate number of viral copies(<138 copies/mL). A negative result must be combined with clinical observations, patient history, and epidemiological information. The expected result is Negative.  Fact Sheet for Patients:  EntrepreneurPulse.com.au  Fact Sheet for Healthcare Providers:  IncredibleEmployment.be  This test is no t yet approved or cleared by the [place] FDA and  has been authorized for detection and/or diagnosis of SARS-CoV-2 by FDA under an  Emergency Use Authorization (EUA). This EUA will remain  in effect (meaning this test can be used) for the duration of the COVID-19 declaration under Section 564(b)(1) of the Act, 21 U.S.C.section 360bbb-3(b)(1), unless the authorization is terminated  or revoked sooner.       Influenza A by PCR NEGATIVE NEGATIVE Final   Influenza B by PCR NEGATIVE NEGATIVE Final    Comment: (NOTE) The Xpert Xpress SARS-CoV-2/FLU/RSV plus assay is intended as an aid in the diagnosis of influenza from Nasopharyngeal swab specimens and should not be used as a sole basis for treatment. Nasal washings and aspirates are unacceptable for Xpert Xpress SARS-CoV-2/FLU/RSV testing.  Fact Sheet for Patients: EntrepreneurPulse.com.au  Fact Sheet for Healthcare Providers: IncredibleEmployment.be  This test is not yet approved or cleared by the [place] FDA and has been authorized for detection and/or diagnosis of SARS-CoV-2 by FDA under an Emergency Use Authorization (EUA). This EUA will remain in effect (meaning this test can be used) for the duration of the COVID-19 declaration under Section 564(b)(1) of the Act, 21 U.S.C. section 360bbb-3(b)(1), unless the authorization is terminated or revoked.  Performed at [facility], [address] [place], [place] [address]   Blood Culture (routine x 2)     Status: None (Preliminary result)   Collection Time: 12/08/21  7:44 PM   Specimen: BLOOD LEFT ARM  Result Value Ref Range Status   Specimen Description BLOOD LEFT ARM  Final   Special Requests   Final    BOTTLES DRAWN AEROBIC AND ANAEROBIC Blood Culture results may not be optimal due to an inadequate volume of blood received in culture bottles   Culture   Final    NO GROWTH 4 DAYS Performed at [facility], [address], [place],  [address]    Report Status PENDING  Incomplete  Blood Culture (routine x 2)     Status: None (Preliminary  result)   Collection Time: 12/08/21  7:44 PM   Specimen: BLOOD RIGHT ARM  Result Value Ref Range Status   Specimen Description BLOOD RIGHT ARM  Final   Special Requests   Final    BOTTLES DRAWN AEROBIC AND ANAEROBIC Blood Culture results may not be optimal due to an inadequate volume of blood received in culture bottles   Culture   Final    NO GROWTH 4 DAYS Performed at [facility], [address], [place],  [address]    Report Status PENDING  Incomplete  Urine Culture     Status: Abnormal   Collection Time: 12/08/21 11:43 PM   Specimen: In/Out Cath Urine  Result Value Ref Range Status   Specimen Description   Final  IN/OUT CATH URINE Performed at [facility], [address] [place], [place] [address]    Special Requests   Final    NONE Performed at [facility], [place], [place] [address]    Culture 20,000 COLONIES/mL ESCHERICHIA COLI (A)  Final   Report Status 12/11/2021 FINAL  Final   Organism ID, Bacteria ESCHERICHIA COLI (A)  Final      Susceptibility   Escherichia coli - MIC*    AMPICILLIN >=32 RESISTANT Resistant     CEFAZOLIN <=4 SENSITIVE Sensitive     CEFEPIME <=0.12 SENSITIVE Sensitive     CEFTRIAXONE <=0.25 SENSITIVE Sensitive     CIPROFLOXACIN >=4 RESISTANT Resistant     GENTAMICIN <=1 SENSITIVE Sensitive     IMIPENEM <=0.25 SENSITIVE Sensitive     NITROFURANTOIN <=16 SENSITIVE Sensitive     TRIMETH/SULFA <=20 SENSITIVE Sensitive     AMPICILLIN/SULBACTAM 16 INTERMEDIATE Intermediate     PIP/TAZO <=4 SENSITIVE Sensitive     * 20,000 COLONIES/mL ESCHERICHIA COLI  CSF culture w Gram Stain     Status: None (Preliminary result)   Collection Time: 12/09/21  4:40 PM   Specimen: CSF; Cerebrospinal Fluid  Result Value Ref Range Status   Specimen Description   Final    CSF Performed at [facility], [address], [place], [place] [address]    Special Requests   Final    NONE Performed at  [facility], [address] [place], [place] [address]    Gram Stain   Final    WBC SEEN NO ORGANISMS SEEN RED BLOOD CELLS Performed at [facility], [address], [place], [place] [address]    Culture   Final    NO GROWTH 2 DAYS Performed at [facility], [place] [address], [place], [place] [address]    Report Status PENDING  Incomplete   Imaging IR FL GUIDED LOC OF NEEDLE/CATH TIP FOR SPINAL INJECT RT  Result Date: 12/10/2021 CLINICAL DATA:  Altered mental status EXAM: DIAGNOSTIC LUMBAR PUNCTURE UNDER FLUOROSCOPIC GUIDANCE COMPARISON:  None Available. FLUOROSCOPY: Radiation Exposure Index (as provided by the fluoroscopic device): 0.6 minutes (13 mGy) PROCEDURE: Informed consent was obtained from the patient prior to the procedure, including potential complications of headache, allergy, and pain. With the patient in the lateral decubitus position, the lower back was prepped with chlorhexidine. 1% Lidocaine was used for local anesthesia. Lumbar puncture was performed at the L5-S1 level using a 20 gauge needle with return of clear, colorless CSF with an opening pressure of 26 cm H2O. Approximately 20 ml of CSF were obtained for laboratory studies. Closing pressure was estimated at 14 cm H2O. The patient tolerated the procedure well and there were no apparent complications. IMPRESSION: Successful lumbar puncture using fluoroscopic guidance. Sample of clear, colorless CSF was sent to the lab for analysis. Opening pressure was estimated at 26 cm H2O, with a closing pressure of 14 cm H2O. Electronically Signed   By: Albin Felling M.D.   On: 12/10/2021 08:40   IR FL GUIDED LOC OF NEEDL/CATH TIP FOR SPINAL INJECT LT  Result Date: 12/09/2021 CLINICAL DATA:  Altered mental status EXAM: Attempted lumbar puncture using fluoroscopy COMPARISON:  None Available. FLUOROSCOPY: Radiation Exposure Index (as provided by the fluoroscopic device): 1.2 minutes (32 mGy) PROCEDURE:  Attempts at positioning of the patient on the procedure table in both the prone and lateral decubitus positions were unsuccessful due to patient's inability to follow instructions. Procedure aborted. IMPRESSION:  Attempted lumbar puncture aborted due to patient's inability to follow instructions and safety concerns. Patient will be rescheduled with sedation. Electronically Signed   By: Albin Felling M.D.   On: 12/09/2021 13:31   MR BRAIN WO CONTRAST  Result Date: 12/09/2021 CLINICAL DATA:  Altered mental status EXAM: MRI HEAD WITHOUT CONTRAST TECHNIQUE: Multiplanar, multiecho pulse sequences of the brain and surrounding structures were obtained without intravenous contrast. COMPARISON:  04/07/2017 MRI, correlation is also made with CT head 12/08/2021 FINDINGS: Evaluation is significantly limited by motion artifact. In addition, the study could not be completed due to patient pain and motion. Brain: No restricted diffusion to suggest acute or subacute infarct. No definite acute hemorrhage, mass, mass effect, or midline shift. No hydrocephalus or definite extra-axial collection. Lacunar infarcts in the left basal ganglia and pons. Vascular: Grossly normal arterial flow voids. Skull and upper cervical spine: Unable to evaluate. Sinuses/Orbits: Clear paranasal sinuses. No acute finding in the orbits. Other: None. IMPRESSION: Evaluation is limited by motion artifact and early termination. Within this limitation, no acute intracranial process. No evidence of acute or subacute infarct. Electronically Signed   By: Merilyn Baba M.D.   On: 12/09/2021 01:58   CT Abdomen Pelvis W Contrast  Result Date: 12/08/2021 CLINICAL DATA:  Abdominal pain. EXAM: CT ABDOMEN AND PELVIS WITH CONTRAST TECHNIQUE: Multidetector CT imaging of the abdomen and pelvis was performed using the standard protocol following bolus administration of intravenous contrast. RADIATION DOSE REDUCTION: This exam was performed according to the  departmental dose-optimization program which includes automated exposure control, adjustment of the mA and/or kV according to patient size and/or use of iterative reconstruction technique. CONTRAST:  165m OMNIPAQUE IOHEXOL 350 MG/ML SOLN COMPARISON:  February 21, 2021 FINDINGS: Lower chest: No acute abnormality. Hepatobiliary: No focal liver abnormality is seen. Mild, stable central intrahepatic biliary dilatation is noted. The gallbladder is moderately distended. No gallstones, gallbladder wall thickening, or extrahepatic biliary dilatation. Pancreas: Unremarkable. No pancreatic ductal dilatation or surrounding inflammatory changes. Spleen: Normal in size without focal abnormality. Adrenals/Urinary Tract: Adrenal glands are unremarkable. Kidneys are normal, without renal calculi, focal lesion, or hydronephrosis. Bladder is unremarkable. Stomach/Bowel: Stomach is within normal limits. The appendix is not clearly identified. No evidence of bowel wall thickening, distention, or inflammatory changes. Noninflamed diverticula are seen throughout the sigmoid colon. Vascular/Lymphatic: Aortic atherosclerosis. No enlarged abdominal or pelvic lymph nodes. Reproductive: Status post hysterectomy. No adnexal masses. Other: A stable 3.0 cm x 5.3 cm ventral hernia is seen along the anterior abdominal wall. An additional stable 2.1 cm x 5.3 cm ventral hernia is seen along the anterior pelvic wall. No abdominopelvic ascites. Musculoskeletal: Postoperative changes are seen at the levels of L4 and L5. IMPRESSION: 1. Sigmoid diverticulosis. 2. Stable ventral hernias along the anterior abdominal wall and anterior pelvic wall. 3. Postoperative changes at the levels of L4 and L5. 4. Aortic atherosclerosis. Aortic Atherosclerosis (ICD10-I70.0). Electronically Signed   By: TVirgina NorfolkM.D.   On: 12/08/2021 23:22   CT Head Wo Contrast  Result Date: 12/08/2021 CLINICAL DATA:  Altered mental status. EXAM: CT HEAD WITHOUT CONTRAST  TECHNIQUE: Contiguous axial images were obtained from the base of the skull through the vertex without intravenous contrast. RADIATION DOSE REDUCTION: This exam was performed according to the departmental dose-optimization program which includes automated exposure control, adjustment of the mA and/or kV according to patient size and/or use of iterative reconstruction technique. COMPARISON:  April 07, 2017 FINDINGS: Brain: There is mild cerebral atrophy with widening of the  extra-axial spaces and ventricular dilatation. There are areas of decreased attenuation within the white matter tracts of the supratentorial brain, consistent with microvascular disease changes. Chronic left basal ganglia lacunar infarcts are seen. Vascular: No hyperdense vessel or unexpected calcification. Skull: Normal. Negative for fracture or focal lesion. Sinuses/Orbits: No acute finding. Other: None. IMPRESSION: 1. No acute intracranial abnormality. 2. Generalized cerebral atrophy and microvascular disease changes of the supratentorial brain. 3. Chronic left basal ganglia lacunar infarcts. Electronically Signed   By: Virgina Norfolk M.D.   On: 12/08/2021 22:01   DG Chest Port 1 View  Result Date: 12/08/2021 CLINICAL DATA:  Questionable sepsis EXAM: PORTABLE CHEST - 1 VIEW COMPARISON:  06/12/2020 FINDINGS: Coarse perihilar interstitial opacities, most marked in the left lower lung as before. Heart size and mediastinal contours are within normal limits. No effusion. Cervical fixation hardware noted.  Bilateral shoulder DJD. IMPRESSION: Persistent coarse perihilar interstitial opacities, most marked in the left lower lung. Electronically Signed   By: Lucrezia Europe M.D.   On: 12/08/2021 19:43      Time coordinating discharge: over 30 minutes  SIGNED:  Emeterio Reeve DO [facility]

## 2021-12-12 NOTE — TOC Progression Note (Signed)
Transition of Care [facility]) - Progression Note    Patient Details  Name: [name] MRN: [id number] Date of Birth: March 02, 1965  Transition of Care [facility]) CM/SW [place] Phone Number: 12/12/2021, 4:51 PM   [facility] Appeal Detailed Notice of Discharge letter created and saved: Yes (Completed by Ocie Bob, LCSW) Detailed Notice of Discharge Document Given to [facility]: Yes (Completed by Ocie Bob, LCSW) [facility] ROI Document Created: Yes [employer] appeal documents uploaded to [employer] stite: Yes (Upload Confirmation:[id number])   [employer] Case ID: [id number]

## 2021-12-12 NOTE — Evaluation (Signed)
Occupational Therapy Evaluation Patient Details Name: [name] MRN: [id number] DOB: May 18, 1965 Today's Date: 12/12/2021   History of Present Illness 56 yo female presnets with Acute metabolic encephalopathy. PMH includes HTN, COPD, depression, diabetes, prior CVA, and ventral hernias s/p repair   Clinical Impression   Patient presenting with decreased Ind in self care,balance, functional mobility/transfers, endurance, and safety awareness.  Patient reports living at home with spouse. She reports short distance ambulating into bathroom ~ 2 months ago but since that time has been laterally scooting into electric wheelchair for mobility. Husband assisting with ADLs and IADLs.  Patient currently requires max A for bed mobility and declines OOB attempts. Pt also refusing SNF recommendation while therapist discussing with pt. She plans to return home with hoyer lift. OT did explain to pt that hoyer lift needs 2 people at home to operate safely which she reports not having at this time. She also verbalized no heat at home and they use space heaters. OT does recommend hospital bed if pt returns home.   Patient will benefit from acute OT to increase overall independence in the areas of ADLs, functional mobility, and safety awareness in order to safely discharge to next venue of care.      Recommendations for follow up therapy are one component of a multi-disciplinary discharge planning process, led by the attending physician.  Recommendations may be updated based on patient status, additional functional criteria and insurance authorization.   Follow Up Recommendations  Skilled nursing-short term rehab (<3 hours/day)     Assistance Recommended at Discharge Intermittent Supervision/Assistance  Patient can return home with the following Two people to help with bathing/dressing/bathroom;Two people to help with walking and/or transfers;Help with stairs or ramp for entrance;Assist for transportation     Functional Status Assessment  Patient has had a recent decline in their functional status and demonstrates the ability to make significant improvements in function in a reasonable and predictable amount of time.  Equipment Recommendations  Hospital bed       Precautions / Restrictions Precautions Precautions: Fall      Mobility Bed Mobility   Bed Mobility: Rolling Rolling: Max assist              Transfers                   General transfer comment: pt refusal          ADL either performed or assessed with clinical judgement   ADL Overall ADL's : Needs assistance/impaired                                       General ADL Comments: LB self care bed level max- total A. UB self care at bed level at min A. Pt declines transfers.     Vision Patient Visual Report: No change from baseline              Pertinent Vitals/Pain Pain Assessment Pain Assessment: Faces Faces Pain Scale: Hurts a little bit Pain Location: generalized Pain Descriptors / Indicators: Discomfort Pain Intervention(s): Monitored during session, Repositioned     Hand Dominance Right   Extremity/Trunk Assessment Upper Extremity Assessment Upper Extremity Assessment: Generalized weakness   Lower Extremity Assessment Lower Extremity Assessment: Generalized weakness       Communication Communication Communication: No difficulties   Cognition Arousal/Alertness: Awake/alert Behavior During Therapy: WFL for tasks assessed/performed Overall Cognitive Status:  Within Functional Limits for tasks assessed                                                  Home Living Family/patient expects to be discharged to:: Private residence Living Arrangements: Spouse/significant other Available Help at Discharge: Family;Available 24 hours/day Type of Home: Mobile home Home Access: Ramped entrance     Home Layout: One level     Bathroom Shower/Tub:  Teacher, early years/pre: Standard Bathroom Accessibility: Yes   Home Equipment: Shower seat;Wheelchair - Press photographer          Prior Functioning/Environment Prior Level of Function : Needs assist             Mobility Comments: Pt reports she transfers herself and was walking into bathroom until ~ 2 months ago. She now mostly uses an Transport planner in home. ADLs Comments: husband assits with ADLs and IADLs        OT Problem List: Decreased strength;Decreased range of motion;Decreased activity tolerance;Impaired balance (sitting and/or standing);Decreased knowledge of use of DME or AE;Decreased safety awareness;Cardiopulmonary status limiting activity      OT Treatment/Interventions: Self-care/ADL training;Therapeutic exercise;Therapeutic activities;Energy conservation;DME and/or AE instruction;Patient/family education;Balance training    OT Goals(Current goals can be found in the care plan section) Acute Rehab OT Goals Patient Stated Goal: to return home OT Goal Formulation: With patient Time For Goal Achievement: 12/26/21 Potential to Achieve Goals: Fair ADL Goals Pt Will Perform Grooming: sitting;with min assist Pt Will Perform Lower Body Dressing: with mod assist Pt Will Transfer to Toilet: with mod assist;squat pivot transfer Pt Will Perform Toileting - Clothing Manipulation and hygiene: with mod assist;bed level  OT Frequency: Min 2X/week       AM-PAC OT "6 Clicks" Daily Activity     Outcome Measure Help from another person eating meals?: None Help from another person taking care of personal grooming?: A Little Help from another person toileting, which includes using toliet, bedpan, or urinal?: Total Help from another person bathing (including washing, rinsing, drying)?: Total Help from another person to put on and taking off regular upper body clothing?: Total Help from another person to put on and taking off regular lower body clothing?:  Total 6 Click Score: 11   End of Session Nurse Communication: Mobility status  Activity Tolerance: Patient limited by fatigue Patient left: in bed;with call bell/phone within reach;with bed alarm set  OT Visit Diagnosis: Unsteadiness on feet (R26.81);Muscle weakness (generalized) (M62.81)                Time: 9470-9628 OT Time Calculation (min): 26 min Charges:  OT General Charges $OT Visit: 1 Visit OT Evaluation $OT Eval Moderate Complexity: 1 Mod OT Treatments $Self Care/Home Management : 8-22 mins  Darleen Crocker, MS, OTR/L , CBIS ascom [phone]  12/12/21, 11:21 AM

## 2021-12-12 NOTE — Progress Notes (Signed)
    Durable Medical Equipment  (From admission, onward)           Start     Ordered   12/12/21 1111  For home use only DME Hospital bed  Once       Question Answer Comment  Length of Need Lifetime   Patient has (list medical condition): stroke w/ hemiparesis, debility   The above medical condition requires: Patient requires the ability to reposition frequently   Bed type Semi-electric   Hoyer Lift Yes      12/12/21 1110   12/11/21 1419  For home use only DME Other see comment  Once       Comments: HOYER LIFT  Question:  Length of Need  Answer:  Lifetime   12/11/21 [address], [place]

## 2021-12-12 NOTE — Care Management Important Message (Signed)
Important Message  Patient Details  Name: [name] MRN: [id number] Date of Birth: 08/07/1965   Medicare Important Message Given:  Yes     Dannette Barbara 12/12/2021, 2:29 PM

## 2021-12-13 LAB — CSF CULTURE W GRAM STAIN: Culture: NO GROWTH

## 2021-12-13 LAB — CULTURE, BLOOD (ROUTINE X 2)
Culture: NO GROWTH
Culture: NO GROWTH

## 2021-12-13 LAB — GLUCOSE, CAPILLARY
Glucose-Capillary: 116 mg/dL — ABNORMAL HIGH (ref 70–99)
Glucose-Capillary: 118 mg/dL — ABNORMAL HIGH (ref 70–99)
Glucose-Capillary: 138 mg/dL — ABNORMAL HIGH (ref 70–99)
Glucose-Capillary: 140 mg/dL — ABNORMAL HIGH (ref 70–99)
Glucose-Capillary: 152 mg/dL — ABNORMAL HIGH (ref 70–99)
Glucose-Capillary: 184 mg/dL — ABNORMAL HIGH (ref 70–99)

## 2021-12-13 MED ORDER — IPRATROPIUM BROMIDE 0.03 % NA SOLN
2.0000 | Freq: Four times a day (QID) | NASAL | Status: DC
  Administered 2021-12-13 – 2021-12-14 (×3): 2 via NASAL
  Filled 2021-12-13: qty 30

## 2021-12-13 MED ORDER — ACETIC ACID 2 % OT SOLN
4.0000 [drp] | Freq: Four times a day (QID) | OTIC | Status: DC
  Filled 2021-12-13: qty 15

## 2021-12-13 MED ORDER — ACETIC ACID 2 % OT SOLN
4.0000 [drp] | Freq: Four times a day (QID) | OTIC | Status: DC
  Administered 2021-12-13 – 2021-12-14 (×2): 4 [drp] via OTIC
  Filled 2021-12-13 (×2): qty 15

## 2021-12-13 NOTE — TOC CM/SW Note (Signed)
63 Day PASRR Note   Patient Details  Name: [name] Date of Birth: 03/01/1965   Transition of Care [facility]) CM/SW Contact:    Colen Darling, [place] Phone Number: 12/13/2021, 2:55 PM  To Whom It May Concern:  Please be advised that this patient will require a short-term nursing home stay - anticipated 30 days or less for rehabilitation and strengthening.   The plan is for return home.

## 2021-12-13 NOTE — Progress Notes (Signed)
PROGRESS NOTE    [name]   ZOX:[id number] DOB: 07/23/1965  DOA: 12/08/2021 Date of Service: 12/13/21 PCP: Olin Hauser, DO     Brief Narrative / Hospital Course:  [name] is a 56 y.o. female  with medical history significant for Hypertension, type 2 diabetes depression, prior stroke with right-sided deficits, non ambulant, dependent on a motorized wheelchair , class III obesity, COPD, chronic pain on buprenorphine film, depression on multiple psychoactive pills, nicotine dependence, ventral hernias s/p repair who presented to the ED with altered mental status.  Reportedly, she had stopped eating and drinking about 3 days prior to admission and vomited on the day of admission.  She became more confused so she was brought to the ED for further evaluation.  11/27-11/28: treated with empiric antimicrobial therapy for suspected meningitis. She underwent lumbar puncture and CSF analysis did not show evidence of meningitis. Urinalysis was abnormal and urine culture showed E. coli continue IV ceftriaxone was continued for suspected UTI.  11/29-11/30: completed ceftriaxone, pt mentally/cognitively intact. Unable to transfer self d/t weakness, refusing SNF, will need [facility] lift and EMS transport. Stable to d/c then was declining EMS home, and then declining to discharge, appealing  12/01: reports ear pain and sore throat, no concerns on exam but will rx augmentin. She is now wanting SNF. See TOC notes.   Consultants:  none  Procedures: none      ASSESSMENT & PLAN:   Principal Problem:   Acute metabolic encephalopathy Active Problems:   High anion gap metabolic acidosis   COPD (chronic obstructive pulmonary disease) (HCC)   Anxiety and depression   Chronic hypoxemic respiratory failure (HCC)   Chronic pain syndrome   Hemiparesis of right dominant side as late effect of cerebrovascular disease (HCC)   Obesity, Class III, BMI 40-49.9 (morbid obesity) (HCC)   E.  coli UTI   Hypokalemia  Acute metabolic encephalopathy: Improved. Likely d/t UTI  Meningitis has been ruled out.   Discontinue broad-spectrum antibiotics for meningitis.   No acute abnormality on MRI brain. Has completed abx for UTI    E. coli UTI:  Completed IV ceftriaxone.     Ear pain Concern for infection, likely viral vs eustachian tube dysfunction Has completed ceftriaxone which would cover OM Will rx nasal spray  Hypokalemia:  Repleting  Repeat BMP and Mg   Lactic acidosis/metabolic acidosis:  Improved Monitor BMP   History of stroke,  uses motorized wheelchair to get around:  Continue Plavix   Other comorbidities include migraine, chronic pain syndrome on buprenorphine, anxiety, depression, COPD    DVT prophylaxis: SCD Pertinent IV fluids/nutrition: none Central lines / invasive devices: none  Code Status: FULL CODE   Disposition: inpatient TOC needs: pt refusing SNF, will need HH/DME Barriers to discharge / significant pending items: HH/DME, repeat potassium              Subjective:  Patient reports l ear soreness, no fever/chills, no difficulty swallowing Denies CP/SOB.  Pain controlled.  Denies new weakness.  Tolerating diet.  Reports no concerns w/ urination/defecation.   Family Communication: none at this time     Objective Findings:  Vitals:   12/12/21 1952 12/13/21 0421 12/13/21 0848 12/13/21 1542  BP: 130/62 (!) 126/59 122/63 110/75  Pulse: 80 75 83 87  Resp: '20 18 20 18  '$ Temp: 98.1 F (36.7 C) 97.7 F (36.5 C) 98.1 F (36.7 C) 98.9 F (37.2 C)  TempSrc:      SpO2: 100% 98%  99%   Weight:      Height:        Intake/Output Summary (Last 24 hours) at 12/13/2021 1629 Last data filed at 12/13/2021 2482 Gross per 24 hour  Intake 120 ml  Output 1800 ml  Net -1680 ml   Filed Weights   12/08/21 1938 12/11/21 0535  Weight: 112.4 kg 108.6 kg    Examination:  Constitutional:  VS as above General Appearance: alert,  well-developed, well-nourished, NAD Respiratory: Normal respiratory effort No wheeze No rhonchi No rales Cardiovascular: S1/S2 normal No murmur No rub/gallop auscultated Gastrointestinal: No tenderness Neurological: No cranial nerve deficit on limited exam Alert Psychiatric: Normal judgment/insight Normal mood and affect       Scheduled Medications:   acetic acid  4 drop Left EAR QID   atorvastatin  40 mg Oral QHS   buprenorphine-naloxone  1 tablet Sublingual Daily   buPROPion  150 mg Oral Daily   citalopram  40 mg Oral Daily   clopidogrel  75 mg Oral Daily   fluticasone furoate-vilanterol  1 puff Inhalation Daily   And   umeclidinium bromide  1 puff Inhalation Daily   gabapentin  600 mg Oral TID   insulin aspart  0-20 Units Subcutaneous Q4H   ipratropium  2 spray Each Nare QID   lidocaine (PF)  5 mL Intradermal Once   nicotine  21 mg Transdermal Daily   pantoprazole  40 mg Oral Daily   QUEtiapine  100 mg Oral QHS   topiramate  100 mg Oral BID   Vitamin D (Ergocalciferol)  50,000 Units Oral Q7 days    Continuous Infusions:  sodium chloride      PRN Medications:  acetaminophen **OR** acetaminophen, albuterol, ipratropium-albuterol, ondansetron **OR** ondansetron (ZOFRAN) IV  Antimicrobials:  Anti-infectives (From admission, onward)    Start     Dose/Rate Route Frequency Ordered Stop   12/11/21 1000  cefTRIAXone (ROCEPHIN) 1 g in sodium chloride 0.9 % 100 mL IVPB  Status:  Discontinued        1 g 200 mL/hr over 30 Minutes Intravenous Daily 12/10/21 0948 12/11/21 1026   12/10/21 0600  acyclovir (ZOVIRAX) 800 mg in dextrose 5 % 250 mL IVPB  Status:  Discontinued        800 mg 266 mL/hr over 60 Minutes Intravenous Every 8 hours 12/10/21 0220 12/10/21 0946   12/10/21 0200  acyclovir (ZOVIRAX) 800 mg in dextrose 5 % 250 mL IVPB  Status:  Discontinued        800 mg 266 mL/hr over 60 Minutes Intravenous Every 8 hours 12/09/21 1701 12/10/21 0219   12/09/21 1630   vancomycin (VANCOCIN) IVPB 1000 mg/200 mL premix  Status:  Discontinued        1,000 mg 200 mL/hr over 60 Minutes Intravenous Every 12 hours 12/09/21 0211 12/10/21 0946   12/09/21 1000  acyclovir (ZOVIRAX) 595 mg in dextrose 5 % 100 mL IVPB  Status:  Discontinued        10 mg/kg  59.3 kg (Ideal) 111.9 mL/hr over 60 Minutes Intravenous Every 8 hours 12/09/21 0201 12/10/21 0159   12/09/21 0300  vancomycin (VANCOCIN) IVPB 1000 mg/200 mL premix  Status:  Discontinued        1,000 mg 200 mL/hr over 60 Minutes Intravenous Every 12 hours 12/09/21 0207 12/09/21 0211   12/09/21 0215  vancomycin (VANCOCIN) IVPB 1000 mg/200 mL premix       See Hyperspace for full Linked Orders Report.   1,000 mg 200 mL/hr over  60 Minutes Intravenous  Once 12/09/21 0201 12/09/21 0536   12/09/21 0215  vancomycin (VANCOREADY) IVPB 1500 mg/300 mL       See Hyperspace for full Linked Orders Report.   1,500 mg 150 mL/hr over 120 Minutes Intravenous  Once 12/09/21 0201 12/09/21 0805   12/09/21 0200  cefTRIAXone (ROCEPHIN) 2 g in sodium chloride 0.9 % 100 mL IVPB  Status:  Discontinued        2 g 200 mL/hr over 30 Minutes Intravenous Every 12 hours 12/09/21 0148 12/10/21 0946   12/09/21 0200  vancomycin (VANCOCIN) IVPB 1000 mg/200 mL premix  Status:  Discontinued        1,000 mg 200 mL/hr over 60 Minutes Intravenous  Once 12/09/21 0148 12/09/21 0200   12/09/21 0200  ampicillin (OMNIPEN) 2 g in sodium chloride 0.9 % 100 mL IVPB  Status:  Discontinued        2 g 300 mL/hr over 20 Minutes Intravenous  Once 12/09/21 0148 12/09/21 0158   12/09/21 0200  acyclovir (ZOVIRAX) 595 mg in dextrose 5 % 100 mL IVPB  Status:  Discontinued        10 mg/kg  59.3 kg (Ideal) 111.9 mL/hr over 60 Minutes Intravenous Every 8 hours 12/09/21 0148 12/09/21 0201           Data Reviewed: I have personally reviewed following labs and imaging studies  CBC: Recent Labs  Lab 12/08/21 1944 12/09/21 0430 12/11/21 0644  WBC 12.7* 14.0*  8.7  NEUTROABS 9.8*  --  4.9  HGB 15.1* 14.0 13.7  HCT 46.3* 41.4 42.0  MCV 81.4 81.3 81.6  PLT 383 396 732   Basic Metabolic Panel: Recent Labs  Lab 12/08/21 1944 12/09/21 0430 12/10/21 0740 12/11/21 0644 12/11/21 1416  NA 133* 134*  --  139 137  K 3.7 3.2*  --  2.5* 3.6  CL 100 101  --  103 105  CO2 17* 20*  --  26 23  GLUCOSE 150* 152*  --  119* 130*  BUN 19 18  --  12 12  CREATININE 0.74 0.70 0.62 0.71 0.75  CALCIUM 9.3 8.9  --  8.9 8.6*  MG  --   --   --  2.1 2.0   GFR: Estimated Creatinine Clearance: 95.6 mL/min (by C-G formula based on SCr of 0.75 mg/dL). Liver Function Tests: Recent Labs  Lab 12/08/21 1944 12/09/21 0430  AST 20 15  ALT 12 9  ALKPHOS 83 70  BILITOT 1.1 0.9  PROT 8.0 6.9  ALBUMIN 3.8 3.3*   No results for input(s): "LIPASE", "AMYLASE" in the last 168 hours. No results for input(s): "AMMONIA" in the last 168 hours. Coagulation Profile: Recent Labs  Lab 12/09/21 0430  INR 1.3*   Cardiac Enzymes: No results for input(s): "CKTOTAL", "CKMB", "CKMBINDEX", "TROPONINI" in the last 168 hours. BNP (last 3 results) No results for input(s): "PROBNP" in the last 8760 hours. HbA1C: No results for input(s): "HGBA1C" in the last 72 hours. CBG: Recent Labs  Lab 12/13/21 0012 12/13/21 0424 12/13/21 0845 12/13/21 1148 12/13/21 1538  GLUCAP 116* 118* 140* 184* 152*   Lipid Profile: No results for input(s): "CHOL", "HDL", "LDLCALC", "TRIG", "CHOLHDL", "LDLDIRECT" in the last 72 hours. Thyroid Function Tests: No results for input(s): "TSH", "T4TOTAL", "FREET4", "T3FREE", "THYROIDAB" in the last 72 hours. Anemia Panel: No results for input(s): "VITAMINB12", "FOLATE", "FERRITIN", "TIBC", "IRON", "RETICCTPCT" in the last 72 hours. Most Recent Urinalysis On File:     Component Value Date/Time  COLORURINE YELLOW (A) 12/08/2021 2343   APPEARANCEUR HAZY (A) 12/08/2021 2343   APPEARANCEUR Clear 09/23/2013 0154   LABSPEC >1.046 (H) 12/08/2021 2343    LABSPEC 1.030 09/23/2013 0154   PHURINE 5.0 12/08/2021 2343   GLUCOSEU NEGATIVE 12/08/2021 2343   GLUCOSEU >=500 09/23/2013 0154   HGBUR NEGATIVE 12/08/2021 2343   BILIRUBINUR NEGATIVE 12/08/2021 2343   BILIRUBINUR Negative 09/23/2013 0154   KETONESUR 80 (A) 12/08/2021 2343   PROTEINUR 30 (A) 12/08/2021 2343   UROBILINOGEN 0.2 03/11/2012 1234   NITRITE NEGATIVE 12/08/2021 2343   LEUKOCYTESUR TRACE (A) 12/08/2021 2343   LEUKOCYTESUR Negative 09/23/2013 0154   Sepsis Labs: '@LABRCNTIP'$ (procalcitonin:4,lacticidven:4)  Recent Results (from the past 240 hour(s))  Resp Panel by RT-PCR (Flu A&B, Covid) Anterior Nasal Swab     Status: None   Collection Time: 12/08/21  7:20 PM   Specimen: Anterior Nasal Swab  Result Value Ref Range Status   SARS Coronavirus 2 by RT PCR NEGATIVE NEGATIVE Final    Comment: (NOTE) SARS-CoV-2 target nucleic acids are NOT DETECTED.  The SARS-CoV-2 RNA is generally detectable in upper respiratory specimens during the acute phase of infection. The lowest concentration of SARS-CoV-2 viral copies this assay can detect is 138 copies/mL. A negative result does not preclude SARS-Cov-2 infection and should not be used as the sole basis for treatment or other patient management decisions. A negative result may occur with  improper specimen collection/handling, submission of specimen other than nasopharyngeal swab, presence of viral mutation(s) within the areas targeted by this assay, and inadequate number of viral copies(<138 copies/mL). A negative result must be combined with clinical observations, patient history, and epidemiological information. The expected result is Negative.  Fact Sheet for Patients:  EntrepreneurPulse.com.au  Fact Sheet for Healthcare Providers:  IncredibleEmployment.be  This test is no t yet approved or cleared by the [place] FDA and  has been authorized for detection and/or diagnosis of  SARS-CoV-2 by FDA under an Emergency Use Authorization (EUA). This EUA will remain  in effect (meaning this test can be used) for the duration of the COVID-19 declaration under Section 564(b)(1) of the Act, 21 U.S.C.section 360bbb-3(b)(1), unless the authorization is terminated  or revoked sooner.       Influenza A by PCR NEGATIVE NEGATIVE Final   Influenza B by PCR NEGATIVE NEGATIVE Final    Comment: (NOTE) The Xpert Xpress SARS-CoV-2/FLU/RSV plus assay is intended as an aid in the diagnosis of influenza from Nasopharyngeal swab specimens and should not be used as a sole basis for treatment. Nasal washings and aspirates are unacceptable for Xpert Xpress SARS-CoV-2/FLU/RSV testing.  Fact Sheet for Patients: EntrepreneurPulse.com.au  Fact Sheet for Healthcare Providers: IncredibleEmployment.be  This test is not yet approved or cleared by the [place] FDA and has been authorized for detection and/or diagnosis of SARS-CoV-2 by FDA under an Emergency Use Authorization (EUA). This EUA will remain in effect (meaning this test can be used) for the duration of the COVID-19 declaration under Section 564(b)(1) of the Act, 21 U.S.C. section 360bbb-3(b)(1), unless the authorization is terminated or revoked.  Performed at [facility], [address] [place], [place] [address]   Blood Culture (routine x 2)     Status: None   Collection Time: 12/08/21  7:44 PM   Specimen: BLOOD LEFT ARM  Result Value Ref Range Status   Specimen Description BLOOD LEFT ARM  Final   Special Requests   Final    BOTTLES DRAWN AEROBIC AND ANAEROBIC Blood Culture results  may not be optimal due to an inadequate volume of blood received in culture bottles   Culture   Final    NO GROWTH 5 DAYS Performed at [facility], [address] [place], [place] [address]    Report Status 12/13/2021 FINAL  Final  Blood Culture (routine x 2)     Status: None    Collection Time: 12/08/21  7:44 PM   Specimen: BLOOD RIGHT ARM  Result Value Ref Range Status   Specimen Description BLOOD RIGHT ARM  Final   Special Requests   Final    BOTTLES DRAWN AEROBIC AND ANAEROBIC Blood Culture results may not be optimal due to an inadequate volume of blood received in culture bottles   Culture   Final    NO GROWTH 5 DAYS Performed at [facility], [address], [place], [place] [address]    Report Status 12/13/2021 FINAL  Final  Urine Culture     Status: Abnormal   Collection Time: 12/08/21 11:43 PM   Specimen: In/Out Cath Urine  Result Value Ref Range Status   Specimen Description   Final    IN/OUT CATH URINE Performed at [facility], [address] [place], [place] [address]    Special Requests   Final    NONE Performed at [facility], [address] [place], [place] [address]    Culture 20,000 COLONIES/mL ESCHERICHIA COLI (A)  Final   Report Status 12/11/2021 FINAL  Final   Organism ID, Bacteria ESCHERICHIA COLI (A)  Final      Susceptibility   Escherichia coli - MIC*    AMPICILLIN >=32 RESISTANT Resistant     CEFAZOLIN <=4 SENSITIVE Sensitive     CEFEPIME <=0.12 SENSITIVE Sensitive     CEFTRIAXONE <=0.25 SENSITIVE Sensitive     CIPROFLOXACIN >=4 RESISTANT Resistant     GENTAMICIN <=1 SENSITIVE Sensitive     IMIPENEM <=0.25 SENSITIVE Sensitive     NITROFURANTOIN <=16 SENSITIVE Sensitive     TRIMETH/SULFA <=20 SENSITIVE Sensitive     AMPICILLIN/SULBACTAM 16 INTERMEDIATE Intermediate     PIP/TAZO <=4 SENSITIVE Sensitive     * 20,000 COLONIES/mL ESCHERICHIA COLI  CSF culture w Gram Stain     Status: None   Collection Time: 12/09/21  4:40 PM   Specimen: CSF; Cerebrospinal Fluid  Result Value Ref Range Status   Specimen Description   Final    CSF Performed at [facility], [address], [place], [place] [address]    Special Requests   Final    NONE Performed at [facility], [address] [place], [place] [address]    Gram Stain   Final    WBC SEEN NO ORGANISMS SEEN RED BLOOD CELLS Performed at [facility], [address] [place], [place] [address]    Culture   Final    NO GROWTH 3 DAYS Performed at [facility], [place] [address], [place], [place] [address]    Report Status 12/13/2021 FINAL  Final         Radiology Studies: IR FL GUIDED LOC OF NEEDLE/CATH TIP FOR SPINAL INJECT RT  Result Date: 12/10/2021 CLINICAL DATA:  Altered mental status EXAM: DIAGNOSTIC LUMBAR PUNCTURE UNDER FLUOROSCOPIC GUIDANCE COMPARISON:  None Available. FLUOROSCOPY: Radiation Exposure Index (as provided by the fluoroscopic device): 0.6 minutes (13 mGy) PROCEDURE: Informed consent was obtained from the patient prior to the procedure, including potential complications of headache, allergy, and pain. With the patient in the  lateral decubitus position, the lower back was prepped with chlorhexidine. 1% Lidocaine was used for local anesthesia. Lumbar puncture was performed at the L5-S1 level using a 20 gauge needle with return of clear, colorless CSF with an opening pressure of 26 cm H2O. Approximately 20 ml of CSF were obtained for laboratory studies. Closing pressure was estimated at 14 cm H2O. The patient tolerated the procedure well and there were no apparent complications. IMPRESSION: Successful lumbar puncture using fluoroscopic guidance. Sample of clear, colorless CSF was sent to the lab for analysis. Opening pressure was estimated at 26 cm H2O, with a closing pressure of 14 cm H2O. Electronically Signed   By: Albin Felling M.D.   On: 12/10/2021 08:40   IR FL GUIDED LOC OF NEEDL/CATH TIP FOR SPINAL INJECT LT  Result Date: 12/09/2021 CLINICAL DATA:  Altered mental status EXAM: Attempted lumbar puncture using fluoroscopy COMPARISON:  None Available. FLUOROSCOPY: Radiation Exposure Index (as provided by the fluoroscopic device): 1.2 minutes (32 mGy) PROCEDURE: Attempts  at positioning of the patient on the procedure table in both the prone and lateral decubitus positions were unsuccessful due to patient's inability to follow instructions. Procedure aborted. IMPRESSION: Attempted lumbar puncture aborted due to patient's inability to follow instructions and safety concerns. Patient will be rescheduled with sedation. Electronically Signed   By: Albin Felling M.D.   On: 12/09/2021 13:31   MR BRAIN WO CONTRAST  Result Date: 12/09/2021 CLINICAL DATA:  Altered mental status EXAM: MRI HEAD WITHOUT CONTRAST TECHNIQUE: Multiplanar, multiecho pulse sequences of the brain and surrounding structures were obtained without intravenous contrast. COMPARISON:  04/07/2017 MRI, correlation is also made with CT head 12/08/2021 FINDINGS: Evaluation is significantly limited by motion artifact. In addition, the study could not be completed due to patient pain and motion. Brain: No restricted diffusion to suggest acute or subacute infarct. No definite acute hemorrhage, mass, mass effect, or midline shift. No hydrocephalus or definite extra-axial collection. Lacunar infarcts in the left basal ganglia and pons. Vascular: Grossly normal arterial flow voids. Skull and upper cervical spine: Unable to evaluate. Sinuses/Orbits: Clear paranasal sinuses. No acute finding in the orbits. Other: None. IMPRESSION: Evaluation is limited by motion artifact and early termination. Within this limitation, no acute intracranial process. No evidence of acute or subacute infarct. Electronically Signed   By: Merilyn Baba M.D.   On: 12/09/2021 01:58   CT Abdomen Pelvis W Contrast  Result Date: 12/08/2021 CLINICAL DATA:  Abdominal pain. EXAM: CT ABDOMEN AND PELVIS WITH CONTRAST TECHNIQUE: Multidetector CT imaging of the abdomen and pelvis was performed using the standard protocol following bolus administration of intravenous contrast. RADIATION DOSE REDUCTION: This exam was performed according to the departmental  dose-optimization program which includes automated exposure control, adjustment of the mA and/or kV according to patient size and/or use of iterative reconstruction technique. CONTRAST:  110m OMNIPAQUE IOHEXOL 350 MG/ML SOLN COMPARISON:  February 21, 2021 FINDINGS: Lower chest: No acute abnormality. Hepatobiliary: No focal liver abnormality is seen. Mild, stable central intrahepatic biliary dilatation is noted. The gallbladder is moderately distended. No gallstones, gallbladder wall thickening, or extrahepatic biliary dilatation. Pancreas: Unremarkable. No pancreatic ductal dilatation or surrounding inflammatory changes. Spleen: Normal in size without focal abnormality. Adrenals/Urinary Tract: Adrenal glands are unremarkable. Kidneys are normal, without renal calculi, focal lesion, or hydronephrosis. Bladder is unremarkable. Stomach/Bowel: Stomach is within normal limits. The appendix is not clearly identified. No evidence of bowel wall thickening, distention, or inflammatory changes. Noninflamed diverticula are seen throughout the sigmoid colon. Vascular/Lymphatic:  Aortic atherosclerosis. No enlarged abdominal or pelvic lymph nodes. Reproductive: Status post hysterectomy. No adnexal masses. Other: A stable 3.0 cm x 5.3 cm ventral hernia is seen along the anterior abdominal wall. An additional stable 2.1 cm x 5.3 cm ventral hernia is seen along the anterior pelvic wall. No abdominopelvic ascites. Musculoskeletal: Postoperative changes are seen at the levels of L4 and L5. IMPRESSION: 1. Sigmoid diverticulosis. 2. Stable ventral hernias along the anterior abdominal wall and anterior pelvic wall. 3. Postoperative changes at the levels of L4 and L5. 4. Aortic atherosclerosis. Aortic Atherosclerosis (ICD10-I70.0). Electronically Signed   By: Virgina Norfolk M.D.   On: 12/08/2021 23:22   CT Head Wo Contrast  Result Date: 12/08/2021 CLINICAL DATA:  Altered mental status. EXAM: CT HEAD WITHOUT CONTRAST TECHNIQUE:  Contiguous axial images were obtained from the base of the skull through the vertex without intravenous contrast. RADIATION DOSE REDUCTION: This exam was performed according to the departmental dose-optimization program which includes automated exposure control, adjustment of the mA and/or kV according to patient size and/or use of iterative reconstruction technique. COMPARISON:  April 07, 2017 FINDINGS: Brain: There is mild cerebral atrophy with widening of the extra-axial spaces and ventricular dilatation. There are areas of decreased attenuation within the white matter tracts of the supratentorial brain, consistent with microvascular disease changes. Chronic left basal ganglia lacunar infarcts are seen. Vascular: No hyperdense vessel or unexpected calcification. Skull: Normal. Negative for fracture or focal lesion. Sinuses/Orbits: No acute finding. Other: None. IMPRESSION: 1. No acute intracranial abnormality. 2. Generalized cerebral atrophy and microvascular disease changes of the supratentorial brain. 3. Chronic left basal ganglia lacunar infarcts. Electronically Signed   By: Virgina Norfolk M.D.   On: 12/08/2021 22:01   DG Chest Port 1 View  Result Date: 12/08/2021 CLINICAL DATA:  Questionable sepsis EXAM: PORTABLE CHEST - 1 VIEW COMPARISON:  06/12/2020 FINDINGS: Coarse perihilar interstitial opacities, most marked in the left lower lung as before. Heart size and mediastinal contours are within normal limits. No effusion. Cervical fixation hardware noted.  Bilateral shoulder DJD. IMPRESSION: Persistent coarse perihilar interstitial opacities, most marked in the left lower lung. Electronically Signed   By: Lucrezia Europe M.D.   On: 12/08/2021 19:43            LOS: 4 days       Emeterio Reeve, DO [facility] 12/13/2021, 4:29 PM    Dictation software may have been used to generate the above note. Typos may occur and escape review in typed/dictated notes. Please contact Dr  Sheppard Coil directly for clarity if needed.  Staff may message me via secure chat in [place]  but this may not receive an immediate response,  please page me for urgent matters!  If 7PM-7AM, please contact night coverage [email or web address]

## 2021-12-13 NOTE — TOC Progression Note (Signed)
Transition of Care [facility]) - Progression Note    Patient Details  Name: [name] MRN: [id number] Date of Birth: 1965-06-18  Transition of Care [facility]) CM/SW [place], [place] Phone Number: 12/13/2021, 3:09 PM  Clinical Narrative:      Patient has lost her medicare appeal. She will need to discharge from the hospital by 12 pm tomorrow. Prior TOC SW ordered DME including a hoyer lift and hospital bed.      Expected Discharge Plan and [place] with DME       Expected Discharge Date: 12/12/21                                     Social Determinants of Health (SDOH) Interventions    Readmission Risk Interventions     No data to display

## 2021-12-13 NOTE — Progress Notes (Signed)
CSW provided patient with DND and [place] 12 forms.  Pt signed [place] form.  Process was explained to patient.  Pt stated that she had no questions and she understood the process.  [place], [place]

## 2021-12-13 NOTE — Progress Notes (Signed)
Physical Therapy Treatment Patient Details Name: [name] MRN: [id number] DOB: 01-20-1965 Today's Date: 12/13/2021   History of Present Illness [name] is a 77yoF who comes to [facility] on 12/08/21 c concerns from husband regarding worsening mentation and progressive weakness over past several weeks. UA with ketones but otherwise unremarkable and UDS positive for cannabinoids and tricyclics. PMH: HTN, DM2, CVA c right-sided deficits, COPD, chronic pain on buprenorphine film, depression on multiple psychoactive pills, nicotine dependence, ventral hernias s/p repair. At baseline pt performs bed mobility and scoot transfers to power WC with modificied independence, however pt has not been able to perform significant AMB >10years.    PT Comments    Pt in bed on entry, awake, agreeable to attempt mobility despite ongoing mylaglias and pain. Pt attempts modI to EOB, ultimately requires minA from Chief Strategy Officer. Given weakness and pain, pt unable to safely attempt lateral scoots along EOB, hence attempt to go to WC/recliner is not attempted at present. Author presents meal so pt can eat before heat is lost.      Recommendations for follow up therapy are one component of a multi-disciplinary discharge planning process, led by the attending physician.  Recommendations may be updated based on patient status, additional functional criteria and insurance authorization.  Follow Up Recommendations  Skilled nursing-short term rehab (<3 hours/day) Can patient physically be transported by private vehicle: No   Assistance Recommended at Discharge Frequent or constant Supervision/Assistance  Patient can return home with the following Assistance with cooking/housework;Direct supervision/assist for medications management;Assist for transportation;Help with stairs or ramp for entrance;Two people to help with walking and/or transfers;Two people to help with bathing/dressing/bathroom   Equipment Recommendations  None  recommended by PT    Recommendations for Other Services       Precautions / Restrictions Precautions Precautions: Fall Restrictions Weight Bearing Restrictions: No     Mobility  Bed Mobility Overal bed mobility: Modified Independent Bed Mobility: Supine to Sit     Supine to sit: Min assist (after 10 minutes, authro offers 2 hand held assist for pt to pull self up to short sitting; author facilitates scoot forward for feet to floor)     General bed mobility comments: author asked for flat bed performance to replicate home needs; pt requires max effort, 2 attempts, very slow and antalgic/ weakness, Is steady at EOB for a while,author presents tray    Transfers Overall transfer level:  (deferred. Based on be dmobility performance, pt vices concerns of ability to do much safely or at all given weakness/pain)                      Ambulation/Gait                   Stairs             Wheelchair Mobility    Modified Rankin (Stroke Patients Only)       Balance     Sitting balance-Leahy Scale: Normal                                      Cognition Arousal/Alertness: Awake/alert Behavior During Therapy: WFL for tasks assessed/performed Overall Cognitive Status: Within Functional Limits for tasks assessed  Exercises      General Comments        Pertinent Vitals/Pain Pain Assessment Pain Assessment: 0-10 Pain Score: 9  Pain Location: all over mylalgias and worse low back pain issues Pain Descriptors / Indicators: Discomfort Pain Intervention(s): Limited activity within patient's tolerance, Monitored during session, Premedicated before session    Home Living                          Prior Function            PT Goals (current goals can now be found in the care plan section) Acute Rehab PT Goals Patient Stated Goal: to return home PT Goal Formulation:  With patient Time For Goal Achievement: 12/25/21 Potential to Achieve Goals: Poor Progress towards PT goals: Progressing toward goals    Frequency    Min 2X/week      PT Plan Current plan remains appropriate    Co-evaluation              AM-PAC PT "6 Clicks" Mobility   Outcome Measure  Help needed turning from your back to your side while in a flat bed without using bedrails?: A Lot Help needed moving from lying on your back to sitting on the side of a flat bed without using bedrails?: A Lot Help needed moving to and from a bed to a chair (including a wheelchair)?: Total Help needed standing up from a chair using your arms (e.g., wheelchair or bedside chair)?: Total Help needed to walk in hospital room?: Total Help needed climbing 3-5 steps with a railing? : Total 6 Click Score: 8    End of Session   Activity Tolerance: Patient limited by fatigue;Patient limited by pain Patient left: in bed;with call bell/phone within reach Nurse Communication: Mobility status PT Visit Diagnosis: Unsteadiness on feet (R26.81);Other abnormalities of gait and mobility (R26.89);History of falling (Z91.81);Muscle weakness (generalized) (M62.81);Difficulty in walking, not elsewhere classified (R26.2);Other symptoms and signs involving the nervous system (R29.898)     Time: 1655-3748 PT Time Calculation (min) (ACUTE ONLY): 15 min  Charges:  $Therapeutic Exercise: 8-22 mins                    9:48 AM, 12/13/21 Etta Grandchild, PT, DPT Physical Therapist - [facility] [place]  [phone] ([place])    Sharnice Bosler C 12/13/2021, 9:44 AM

## 2021-12-14 LAB — GLUCOSE, CAPILLARY
Glucose-Capillary: 141 mg/dL — ABNORMAL HIGH (ref 70–99)
Glucose-Capillary: 157 mg/dL — ABNORMAL HIGH (ref 70–99)
Glucose-Capillary: 159 mg/dL — ABNORMAL HIGH (ref 70–99)

## 2021-12-14 MED ORDER — MUSCLE RUB 10-15 % EX CREA
TOPICAL_CREAM | CUTANEOUS | Status: DC | PRN
  Filled 2021-12-14: qty 85

## 2021-12-14 MED ORDER — ACETIC ACID 2 % OT SOLN: 4.0000 [drp] | Freq: Four times a day (QID) | OTIC | 0 refills | Status: DC

## 2021-12-14 NOTE — Discharge Summary (Signed)
Physician Discharge Summary   Patient: [name] MRN: [id number]  DOB: [birth year]   Admit:     Date of Admission: 12/08/2021 Admitted from: home   Discharge: Date of discharge: 12/14/21 Disposition: Home  Condition at discharge: fair  CODE STATUS: FULL CODE     Discharge Physician: Emeterio Reeve, DO [facility]     PCP: Olin Hauser, DO  Recommendations for Outpatient Follow-up:  Follow up with PCP Olin Hauser, DO in 1-2 weeks Please obtain labs/tests: CBC, BMP, UA in 1-2 weeks / as needed Please follow up on the following pending results: none   Discharge Instructions     Diet - low sodium heart healthy   Complete by: As directed    Diet Carb Modified   Complete by: As directed    Discharge patient   Complete by: As directed    Discharge disposition: 06-Home-Health Care Svc   Discharge patient date: 12/14/2021   Increase activity slowly   Complete by: As directed          Discharge Diagnoses: Principal Problem:   Acute metabolic encephalopathy Active Problems:   High anion gap metabolic acidosis   COPD (chronic obstructive pulmonary disease) (HCC)   Anxiety and depression   Chronic hypoxemic respiratory failure (HCC)   Chronic pain syndrome   Hemiparesis of right dominant side as late effect of cerebrovascular disease (HCC)   Obesity, Class III, BMI 40-49.9 (morbid obesity) ([place])   E. coli UTI   Hypokalemia       Hospital Course: [name] is a 56 y.o. female  with medical history significant for Hypertension, type 2 diabetes depression, prior stroke with right-sided deficits, non ambulant, dependent on a motorized wheelchair , class III obesity, COPD, chronic pain on buprenorphine film, depression on multiple psychoactive pills, nicotine dependence, ventral hernias s/p repair who presented to the ED with altered mental status.  Reportedly, she had stopped eating and drinking about 3 days prior to  admission and vomited on the day of admission.  She became more confused so she was brought to the ED for further evaluation.  11/27-11/28: treated with empiric antimicrobial therapy for suspected meningitis. She underwent lumbar puncture and CSF analysis did not show evidence of meningitis. Urinalysis was abnormal and urine culture showed E. coli continue IV ceftriaxone was continued for suspected UTI.  11/29-11/30: completed ceftriaxone, pt mentally/cognitively intact. Unable to transfer self d/t weakness, refusing SNF, [facility] and EMS transport. Stable to d/c then was declining EMS home, and then declining to discharge, appealing  12/01: reports ear pain and sore throat, no concerns on exam and ceftriaxone would have treated OM/sinusitis, suspect eustachian tube df and drops/NS rx. She is now wanting SN and this is in process. See TOC notes. Lost appeal for d/c and needs to d/c by tomorrow noon.  12/02: discharged to home  Consultants:  none  Procedures: none      ASSESSMENT & PLAN:   Principal Problem:   Acute metabolic encephalopathy Active Problems:   High anion gap metabolic acidosis   COPD (chronic obstructive pulmonary disease) (HCC)   Anxiety and depression   Chronic hypoxemic respiratory failure (HCC)   Chronic pain syndrome   Hemiparesis of right dominant side as late effect of cerebrovascular disease (HCC)   Obesity, Class III, BMI 40-49.9 (morbid obesity) (HCC)   E. coli UTI   Hypokalemia  Acute metabolic encephalopathy: Improved. Likely d/t UTI  Meningitis has been ruled out.  Discontinue broad-spectrum antibiotics for meningitis.   No acute abnormality on MRI brain. Has completed abx for UTI    E. coli UTI:  Completed IV ceftriaxone.     Ear pain likely viral vs eustachian tube dysfunction Has completed ceftriaxone which would cover OM Will rx nasal spray and acetic acid otic   Hypokalemia:  Repleting  Repeat BMP and Mg   Lactic  acidosis/metabolic acidosis:  Improved Monitor BMP   History of stroke,  uses motorized wheelchair to get around:  Continue Plavix   Other comorbidities include migraine, chronic pain syndrome on buprenorphine, anxiety, depression, COPD           Discharge Instructions  Allergies as of 12/14/2021       Reactions   Tramadol Nausea And Vomiting, Hypertension   Augmentin [amoxicillin-pot Clavulanate] Rash        Medication List     STOP taking these medications    fluconazole 150 MG tablet Commonly known as: DIFLUCAN   meclizine 25 MG tablet Commonly known as: ANTIVERT   nystatin powder Generic drug: nystatin   ondansetron 4 MG disintegrating tablet Commonly known as: ZOFRAN-ODT   pioglitazone 45 MG tablet Commonly known as: ACTOS   Relistor 150 MG Tabs Generic drug: Methylnaltrexone Bromide   triamcinolone cream 0.1 % Commonly known as: KENALOG       TAKE these medications    acetic acid 2 % otic solution Place 4 drops into the left ear 4 (four) times daily.   albuterol 108 (90 Base) MCG/ACT inhaler Commonly known as: VENTOLIN HFA USE 2 INHALATIONS BY MOUTH EVERY 4 HOURS AS NEEDED FOR WHEEZING  OR SHORTNESS OF BREATH   atorvastatin 40 MG tablet Commonly known as: LIPITOR TAKE 1 TABLET BY MOUTH AT  BEDTIME   azelastine 0.1 % nasal spray Commonly known as: ASTELIN Place 2 sprays into both nostrils 2 (two) times daily.   Buprenorphine HCl-Naloxone HCl 8-2 MG Film Place under the tongue 3 (three) times daily.   buPROPion 150 MG 24 hr tablet Commonly known as: WELLBUTRIN XL TAKE 1 TABLET BY MOUTH DAILY   citalopram 40 MG tablet Commonly known as: CELEXA TAKE 1 TABLET BY MOUTH DAILY   clopidogrel 75 MG tablet Commonly known as: PLAVIX TAKE 1 TABLET BY MOUTH DAILY   dicyclomine 20 MG tablet Commonly known as: BENTYL Take 1 tablet (20 mg total) by mouth 4 (four) times daily -  before meals and at bedtime. As needed for abdominal pain  cramping   fluticasone 50 MCG/ACT nasal spray Commonly known as: FLONASE USE 2 SPRAYS IN BOTH NOSTRILS  DAILY USE FOR 4 TO 6 WEEKS, THEN STOP AND USE SEASONALLY OR AS  NEEDED   gabapentin 600 MG tablet Commonly known as: NEURONTIN TAKE 1 TABLET(600 MG) BY MOUTH FOUR TIMES DAILY   IBU 600 MG tablet Generic drug: ibuprofen Take 600 mg by mouth every 12 (twelve) hours as needed.   metFORMIN 1000 MG tablet Commonly known as: GLUCOPHAGE TAKE 1 TABLET(1000 MG) BY MOUTH TWICE DAILY WITH A MEAL   Mounjaro 5 MG/0.5ML Pen Generic drug: tirzepatide INJECT '5MG'$  UNDER THE SKIN ONCE A WEEK   pantoprazole 40 MG tablet Commonly known as: PROTONIX TAKE 1 TABLET BY MOUTH DAILY   QUEtiapine 100 MG tablet Commonly known as: SEROQUEL TAKE 1 TABLET BY MOUTH AT  BEDTIME   topiramate 100 MG tablet Commonly known as: TOPAMAX TAKE 1 TABLET BY MOUTH TWICE  DAILY   Trelegy Ellipta 100-62.5-25 MCG/ACT Aepb Generic drug:  Fluticasone-Umeclidin-Vilant INHALE 1 PUFF INTO THE LUNGS DAILY   Vitamin D (Ergocalciferol) 1.25 MG (50000 UNIT) Caps capsule Commonly known as: DRISDOL Take 50,000 Units by mouth every 7 (seven) days. On Wednesday               Durable Medical Equipment  (From admission, onward)           Start     Ordered   12/12/21 1111  For home use only DME Hospital bed  Once       Question Answer Comment  Length of Need Lifetime   Patient has (list medical condition): stroke w/ hemiparesis, debility   The above medical condition requires: Patient requires the ability to reposition frequently   Bed type Semi-electric   Hoyer Lift Yes      12/12/21 1110   12/11/21 1419  For home use only DME Other see comment  Once       Comments: HOYER LIFT  Question:  Length of Need  Answer:  Lifetime   12/11/21 1418              Allergies  Allergen Reactions   Tramadol Nausea And Vomiting and Hypertension   Augmentin [Amoxicillin-Pot Clavulanate] Rash     Subjective: pt  has no complaints at this time other than lower back pain, she has refused her AM pain meds, no CP/SOB.    Discharge Exam: BP 121/68 (BP Location: Right Arm)   Pulse 81   Temp 97.7 F (36.5 C)   Resp 18   Ht '5\' 6"'$  (1.676 m)   Wt 108.6 kg   SpO2 99%   BMI 38.64 kg/m  General: Pt is alert, awake, not in acute distress Cardiovascular: RRR, S1/S2 +, no rubs, no gallops Respiratory: CTA bilaterally, no wheezing, no rhonchi Abdominal: Soft, NT, ND, bowel sounds + Extremities: no edema, no cyanosis     The results of significant diagnostics from this hospitalization (including imaging, microbiology, ancillary and laboratory) are listed below for reference.     Microbiology: Recent Results (from the past 240 hour(s))  Resp Panel by RT-PCR (Flu A&B, Covid) Anterior Nasal Swab     Status: None   Collection Time: 12/08/21  7:20 PM   Specimen: Anterior Nasal Swab  Result Value Ref Range Status   SARS Coronavirus 2 by RT PCR NEGATIVE NEGATIVE Final    Comment: (NOTE) SARS-CoV-2 target nucleic acids are NOT DETECTED.  The SARS-CoV-2 RNA is generally detectable in upper respiratory specimens during the acute phase of infection. The lowest concentration of SARS-CoV-2 viral copies this assay can detect is 138 copies/mL. A negative result does not preclude SARS-Cov-2 infection and should not be used as the sole basis for treatment or other patient management decisions. A negative result may occur with  improper specimen collection/handling, submission of specimen other than nasopharyngeal swab, presence of viral mutation(s) within the areas targeted by this assay, and inadequate number of viral copies(<138 copies/mL). A negative result must be combined with clinical observations, patient history, and epidemiological information. The expected result is Negative.  Fact Sheet for Patients:  EntrepreneurPulse.com.au  Fact Sheet for Healthcare Providers:   IncredibleEmployment.be  This test is no t yet approved or cleared by the [place] FDA and  has been authorized for detection and/or diagnosis of SARS-CoV-2 by FDA under an Emergency Use Authorization (EUA). This EUA will remain  in effect (meaning this test can be used) for the duration of the COVID-19 declaration under Section 564(b)(1) of the  Act, 21 U.S.C.section 360bbb-3(b)(1), unless the authorization is terminated  or revoked sooner.       Influenza A by PCR NEGATIVE NEGATIVE Final   Influenza B by PCR NEGATIVE NEGATIVE Final    Comment: (NOTE) The Xpert Xpress SARS-CoV-2/FLU/RSV plus assay is intended as an aid in the diagnosis of influenza from Nasopharyngeal swab specimens and should not be used as a sole basis for treatment. Nasal washings and aspirates are unacceptable for Xpert Xpress SARS-CoV-2/FLU/RSV testing.  Fact Sheet for Patients: EntrepreneurPulse.com.au  Fact Sheet for Healthcare Providers: IncredibleEmployment.be  This test is not yet approved or cleared by the [place] FDA and has been authorized for detection and/or diagnosis of SARS-CoV-2 by FDA under an Emergency Use Authorization (EUA). This EUA will remain in effect (meaning this test can be used) for the duration of the COVID-19 declaration under Section 564(b)(1) of the Act, 21 U.S.C. section 360bbb-3(b)(1), unless the authorization is terminated or revoked.  Performed at [facility], [address] [place], [place] [address]   Blood Culture (routine x 2)     Status: None   Collection Time: 12/08/21  7:44 PM   Specimen: BLOOD LEFT ARM  Result Value Ref Range Status   Specimen Description BLOOD LEFT ARM  Final   Special Requests   Final    BOTTLES DRAWN AEROBIC AND ANAEROBIC Blood Culture results may not be optimal due to an inadequate volume of blood received in culture bottles   Culture   Final    NO GROWTH 5  DAYS Performed at [facility], [address] [place], [place] [address]    Report Status 12/13/2021 FINAL  Final  Blood Culture (routine x 2)     Status: None   Collection Time: 12/08/21  7:44 PM   Specimen: BLOOD RIGHT ARM  Result Value Ref Range Status   Specimen Description BLOOD RIGHT ARM  Final   Special Requests   Final    BOTTLES DRAWN AEROBIC AND ANAEROBIC Blood Culture results may not be optimal due to an inadequate volume of blood received in culture bottles   Culture   Final    NO GROWTH 5 DAYS Performed at [facility], [address] [place], [place] [address]    Report Status 12/13/2021 FINAL  Final  Urine Culture     Status: Abnormal   Collection Time: 12/08/21 11:43 PM   Specimen: In/Out Cath Urine  Result Value Ref Range Status   Specimen Description   Final    IN/OUT CATH URINE Performed at [facility], [address], [place], [place] [address]    Special Requests   Final    NONE Performed at [facility], [address] [place], [place] [address]    Culture 20,000 COLONIES/mL ESCHERICHIA COLI (A)  Final   Report Status 12/11/2021 FINAL  Final   Organism ID, Bacteria ESCHERICHIA COLI (A)  Final      Susceptibility   Escherichia coli - MIC*    AMPICILLIN >=32 RESISTANT Resistant     CEFAZOLIN <=4 SENSITIVE Sensitive     CEFEPIME <=0.12 SENSITIVE Sensitive     CEFTRIAXONE <=0.25 SENSITIVE Sensitive     CIPROFLOXACIN >=4 RESISTANT Resistant     GENTAMICIN <=1 SENSITIVE Sensitive     IMIPENEM <=0.25 SENSITIVE Sensitive     NITROFURANTOIN <=16 SENSITIVE Sensitive     TRIMETH/SULFA <=20 SENSITIVE Sensitive     AMPICILLIN/SULBACTAM 16 INTERMEDIATE Intermediate     PIP/TAZO <=4 SENSITIVE Sensitive     *  20,000 COLONIES/mL ESCHERICHIA COLI  CSF culture w Gram Stain     Status: None   Collection Time: 12/09/21  4:40 PM   Specimen: CSF; Cerebrospinal Fluid  Result Value Ref Range Status   Specimen Description    Final    CSF Performed at [facility], [address] [place], [place] [address]    Special Requests   Final    NONE Performed at [facility], [address] [place], [place] [address]    Gram Stain   Final    WBC SEEN NO ORGANISMS SEEN RED BLOOD CELLS Performed at [facility], [address], [place], [place] [address]    Culture   Final    NO GROWTH 3 DAYS Performed at [facility], [place] [address], [place], [place] [address]    Report Status 12/13/2021 FINAL  Final     Labs: BNP (last 3 results) No results for input(s): "BNP" in the last 8760 hours. Basic Metabolic Panel: Recent Labs  Lab 12/08/21 1944 12/09/21 0430 12/10/21 0740 12/11/21 0644 12/11/21 1416  NA 133* 134*  --  139 137  K 3.7 3.2*  --  2.5* 3.6  CL 100 101  --  103 105  CO2 17* 20*  --  26 23  GLUCOSE 150* 152*  --  119* 130*  BUN 19 18  --  12 12  CREATININE 0.74 0.70 0.62 0.71 0.75  CALCIUM 9.3 8.9  --  8.9 8.6*  MG  --   --   --  2.1 2.0    Liver Function Tests: Recent Labs  Lab 12/08/21 1944 12/09/21 0430  AST 20 15  ALT 12 9  ALKPHOS 83 70  BILITOT 1.1 0.9  PROT 8.0 6.9  ALBUMIN 3.8 3.3*    No results for input(s): "LIPASE", "AMYLASE" in the last 168 hours. No results for input(s): "AMMONIA" in the last 168 hours. CBC: Recent Labs  Lab 12/08/21 1944 12/09/21 0430 12/11/21 0644  WBC 12.7* 14.0* 8.7  NEUTROABS 9.8*  --  4.9  HGB 15.1* 14.0 13.7  HCT 46.3* 41.4 42.0  MCV 81.4 81.3 81.6  PLT 383 396 314    Cardiac Enzymes: No results for input(s): "CKTOTAL", "CKMB", "CKMBINDEX", "TROPONINI" in the last 168 hours. BNP: Invalid input(s): "POCBNP" CBG: Recent Labs  Lab 12/13/21 1538 12/13/21 2055 12/13/21 2356 12/14/21 0425 12/14/21 0800  GLUCAP 152* 138* 157* 159* 141*    D-Dimer No results for input(s): "DDIMER" in the last 72 hours. Hgb A1c No results for input(s): "HGBA1C" in the last 72 hours. Lipid Profile No  results for input(s): "CHOL", "HDL", "LDLCALC", "TRIG", "CHOLHDL", "LDLDIRECT" in the last 72 hours. Thyroid function studies No results for input(s): "TSH", "T4TOTAL", "T3FREE", "THYROIDAB" in the last 72 hours.  Invalid input(s): "FREET3" Anemia work up No results for input(s): "VITAMINB12", "FOLATE", "FERRITIN", "TIBC", "IRON", "RETICCTPCT" in the last 72 hours. Urinalysis    Component Value Date/Time   COLORURINE YELLOW (A) 12/08/2021 2343   APPEARANCEUR HAZY (A) 12/08/2021 2343   APPEARANCEUR Clear 09/23/2013 0154   LABSPEC >1.046 (H) 12/08/2021 2343   LABSPEC 1.030 09/23/2013 0154   PHURINE 5.0 12/08/2021 2343   GLUCOSEU NEGATIVE 12/08/2021 2343   GLUCOSEU >=500 09/23/2013 0154   HGBUR NEGATIVE 12/08/2021 [place] 12/08/2021 2343   BILIRUBINUR Negative 09/23/2013 0154   KETONESUR 80 (A) 12/08/2021 2343   PROTEINUR 30 (A) 12/08/2021 2343   UROBILINOGEN 0.2 03/11/2012 1234   NITRITE  NEGATIVE 12/08/2021 2343   LEUKOCYTESUR TRACE (A) 12/08/2021 2343   LEUKOCYTESUR Negative 09/23/2013 0154   Sepsis Labs Recent Labs  Lab 12/08/21 1944 12/09/21 0430 12/11/21 0644  WBC 12.7* 14.0* 8.7    Microbiology Recent Results (from the past 240 hour(s))  Resp Panel by RT-PCR (Flu A&B, Covid) Anterior Nasal Swab     Status: None   Collection Time: 12/08/21  7:20 PM   Specimen: Anterior Nasal Swab  Result Value Ref Range Status   SARS Coronavirus 2 by RT PCR NEGATIVE NEGATIVE Final    Comment: (NOTE) SARS-CoV-2 target nucleic acids are NOT DETECTED.  The SARS-CoV-2 RNA is generally detectable in upper respiratory specimens during the acute phase of infection. The lowest concentration of SARS-CoV-2 viral copies this assay can detect is 138 copies/mL. A negative result does not preclude SARS-Cov-2 infection and should not be used as the sole basis for treatment or other patient management decisions. A negative result may occur with  improper specimen  collection/handling, submission of specimen other than nasopharyngeal swab, presence of viral mutation(s) within the areas targeted by this assay, and inadequate number of viral copies(<138 copies/mL). A negative result must be combined with clinical observations, patient history, and epidemiological information. The expected result is Negative.  Fact Sheet for Patients:  EntrepreneurPulse.com.au  Fact Sheet for Healthcare Providers:  IncredibleEmployment.be  This test is no t yet approved or cleared by the [place] FDA and  has been authorized for detection and/or diagnosis of SARS-CoV-2 by FDA under an Emergency Use Authorization (EUA). This EUA will remain  in effect (meaning this test can be used) for the duration of the COVID-19 declaration under Section 564(b)(1) of the Act, 21 U.S.C.section 360bbb-3(b)(1), unless the authorization is terminated  or revoked sooner.       Influenza A by PCR NEGATIVE NEGATIVE Final   Influenza B by PCR NEGATIVE NEGATIVE Final    Comment: (NOTE) The Xpert Xpress SARS-CoV-2/FLU/RSV plus assay is intended as an aid in the diagnosis of influenza from Nasopharyngeal swab specimens and should not be used as a sole basis for treatment. Nasal washings and aspirates are unacceptable for Xpert Xpress SARS-CoV-2/FLU/RSV testing.  Fact Sheet for Patients: EntrepreneurPulse.com.au  Fact Sheet for Healthcare Providers: IncredibleEmployment.be  This test is not yet approved or cleared by the [place] FDA and has been authorized for detection and/or diagnosis of SARS-CoV-2 by FDA under an Emergency Use Authorization (EUA). This EUA will remain in effect (meaning this test can be used) for the duration of the COVID-19 declaration under Section 564(b)(1) of the Act, 21 U.S.C. section 360bbb-3(b)(1), unless the authorization is terminated or revoked.  Performed at [facility], [address] [place], [place] [address]   Blood Culture (routine x 2)     Status: None   Collection Time: 12/08/21  7:44 PM   Specimen: BLOOD LEFT ARM  Result Value Ref Range Status   Specimen Description BLOOD LEFT ARM  Final   Special Requests   Final    BOTTLES DRAWN AEROBIC AND ANAEROBIC Blood Culture results may not be optimal due to an inadequate volume of blood received in culture bottles   Culture   Final    NO GROWTH 5 DAYS Performed at [facility], [address], [place], [place] [address]    Report Status 12/13/2021 FINAL  Final  Blood Culture (routine x 2)     Status: None   Collection Time: 12/08/21  7:44 PM   Specimen: BLOOD  RIGHT ARM  Result Value Ref Range Status   Specimen Description BLOOD RIGHT ARM  Final   Special Requests   Final    BOTTLES DRAWN AEROBIC AND ANAEROBIC Blood Culture results may not be optimal due to an inadequate volume of blood received in culture bottles   Culture   Final    NO GROWTH 5 DAYS Performed at [facility], [address] [place], [place] [address]    Report Status 12/13/2021 FINAL  Final  Urine Culture     Status: Abnormal   Collection Time: 12/08/21 11:43 PM   Specimen: In/Out Cath Urine  Result Value Ref Range Status   Specimen Description   Final    IN/OUT CATH URINE Performed at [facility], [address] [place], [place] [address]    Special Requests   Final    NONE Performed at [facility], [address], [place], [place] [address]    Culture 20,000 COLONIES/mL ESCHERICHIA COLI (A)  Final   Report Status 12/11/2021 FINAL  Final   Organism ID, Bacteria ESCHERICHIA COLI (A)  Final      Susceptibility   Escherichia coli - MIC*    AMPICILLIN >=32 RESISTANT Resistant     CEFAZOLIN <=4 SENSITIVE Sensitive     CEFEPIME <=0.12 SENSITIVE Sensitive     CEFTRIAXONE <=0.25 SENSITIVE Sensitive     CIPROFLOXACIN >=4 RESISTANT Resistant     GENTAMICIN <=1  SENSITIVE Sensitive     IMIPENEM <=0.25 SENSITIVE Sensitive     NITROFURANTOIN <=16 SENSITIVE Sensitive     TRIMETH/SULFA <=20 SENSITIVE Sensitive     AMPICILLIN/SULBACTAM 16 INTERMEDIATE Intermediate     PIP/TAZO <=4 SENSITIVE Sensitive     * 20,000 COLONIES/mL ESCHERICHIA COLI  CSF culture w Gram Stain     Status: None   Collection Time: 12/09/21  4:40 PM   Specimen: CSF; Cerebrospinal Fluid  Result Value Ref Range Status   Specimen Description   Final    CSF Performed at [facility], [address] [place], [place] [address]    Special Requests   Final    NONE Performed at [facility], [address] [place], [place] [address]    Gram Stain   Final    WBC SEEN NO ORGANISMS SEEN RED BLOOD CELLS Performed at [facility], [address], [place], [place] [address]    Culture   Final    NO GROWTH 3 DAYS Performed at [facility], [place] [address] [place], [place] [address]    Report Status 12/13/2021 FINAL  Final   Imaging IR FL GUIDED LOC OF NEEDLE/CATH TIP FOR SPINAL INJECT RT  Result Date: 12/10/2021 CLINICAL DATA:  Altered mental status EXAM: DIAGNOSTIC LUMBAR PUNCTURE UNDER FLUOROSCOPIC GUIDANCE COMPARISON:  None Available. FLUOROSCOPY: Radiation Exposure Index (as provided by the fluoroscopic device): 0.6 minutes (13 mGy) PROCEDURE: Informed consent was obtained from the patient prior to the procedure, including potential complications of headache, allergy, and pain. With the patient in the lateral decubitus position, the lower back was prepped with chlorhexidine. 1% Lidocaine was used for local anesthesia. Lumbar puncture was performed at the L5-S1 level using a 20 gauge needle with return of clear, colorless CSF with an opening pressure of 26 cm H2O. Approximately 20 ml of CSF were obtained for laboratory studies. Closing pressure was estimated at 14 cm H2O. The patient tolerated the procedure well and there were no apparent  complications. IMPRESSION: Successful lumbar puncture using fluoroscopic  guidance. Sample of clear, colorless CSF was sent to the lab for analysis. Opening pressure was estimated at 26 cm H2O, with a closing pressure of 14 cm H2O. Electronically Signed   By: Albin Felling M.D.   On: 12/10/2021 08:40   IR FL GUIDED LOC OF NEEDL/CATH TIP FOR SPINAL INJECT LT  Result Date: 12/09/2021 CLINICAL DATA:  Altered mental status EXAM: Attempted lumbar puncture using fluoroscopy COMPARISON:  None Available. FLUOROSCOPY: Radiation Exposure Index (as provided by the fluoroscopic device): 1.2 minutes (32 mGy) PROCEDURE: Attempts at positioning of the patient on the procedure table in both the prone and lateral decubitus positions were unsuccessful due to patient's inability to follow instructions. Procedure aborted. IMPRESSION: Attempted lumbar puncture aborted due to patient's inability to follow instructions and safety concerns. Patient will be rescheduled with sedation. Electronically Signed   By: Albin Felling M.D.   On: 12/09/2021 13:31   MR BRAIN WO CONTRAST  Result Date: 12/09/2021 CLINICAL DATA:  Altered mental status EXAM: MRI HEAD WITHOUT CONTRAST TECHNIQUE: Multiplanar, multiecho pulse sequences of the brain and surrounding structures were obtained without intravenous contrast. COMPARISON:  04/07/2017 MRI, correlation is also made with CT head 12/08/2021 FINDINGS: Evaluation is significantly limited by motion artifact. In addition, the study could not be completed due to patient pain and motion. Brain: No restricted diffusion to suggest acute or subacute infarct. No definite acute hemorrhage, mass, mass effect, or midline shift. No hydrocephalus or definite extra-axial collection. Lacunar infarcts in the left basal ganglia and pons. Vascular: Grossly normal arterial flow voids. Skull and upper cervical spine: Unable to evaluate. Sinuses/Orbits: Clear paranasal sinuses. No acute finding in the orbits. Other:  None. IMPRESSION: Evaluation is limited by motion artifact and early termination. Within this limitation, no acute intracranial process. No evidence of acute or subacute infarct. Electronically Signed   By: Merilyn Baba M.D.   On: 12/09/2021 01:58   CT Abdomen Pelvis W Contrast  Result Date: 12/08/2021 CLINICAL DATA:  Abdominal pain. EXAM: CT ABDOMEN AND PELVIS WITH CONTRAST TECHNIQUE: Multidetector CT imaging of the abdomen and pelvis was performed using the standard protocol following bolus administration of intravenous contrast. RADIATION DOSE REDUCTION: This exam was performed according to the departmental dose-optimization program which includes automated exposure control, adjustment of the mA and/or kV according to patient size and/or use of iterative reconstruction technique. CONTRAST:  180m OMNIPAQUE IOHEXOL 350 MG/ML SOLN COMPARISON:  February 21, 2021 FINDINGS: Lower chest: No acute abnormality. Hepatobiliary: No focal liver abnormality is seen. Mild, stable central intrahepatic biliary dilatation is noted. The gallbladder is moderately distended. No gallstones, gallbladder wall thickening, or extrahepatic biliary dilatation. Pancreas: Unremarkable. No pancreatic ductal dilatation or surrounding inflammatory changes. Spleen: Normal in size without focal abnormality. Adrenals/Urinary Tract: Adrenal glands are unremarkable. Kidneys are normal, without renal calculi, focal lesion, or hydronephrosis. Bladder is unremarkable. Stomach/Bowel: Stomach is within normal limits. The appendix is not clearly identified. No evidence of bowel wall thickening, distention, or inflammatory changes. Noninflamed diverticula are seen throughout the sigmoid colon. Vascular/Lymphatic: Aortic atherosclerosis. No enlarged abdominal or pelvic lymph nodes. Reproductive: Status post hysterectomy. No adnexal masses. Other: A stable 3.0 cm x 5.3 cm ventral hernia is seen along the anterior abdominal wall. An additional stable 2.1  cm x 5.3 cm ventral hernia is seen along the anterior pelvic wall. No abdominopelvic ascites. Musculoskeletal: Postoperative changes are seen at the levels of L4 and L5. IMPRESSION: 1. Sigmoid diverticulosis. 2. Stable ventral hernias along the anterior abdominal wall and anterior  pelvic wall. 3. Postoperative changes at the levels of L4 and L5. 4. Aortic atherosclerosis. Aortic Atherosclerosis (ICD10-I70.0). Electronically Signed   By: Virgina Norfolk M.D.   On: 12/08/2021 23:22   CT Head Wo Contrast  Result Date: 12/08/2021 CLINICAL DATA:  Altered mental status. EXAM: CT HEAD WITHOUT CONTRAST TECHNIQUE: Contiguous axial images were obtained from the base of the skull through the vertex without intravenous contrast. RADIATION DOSE REDUCTION: This exam was performed according to the departmental dose-optimization program which includes automated exposure control, adjustment of the mA and/or kV according to patient size and/or use of iterative reconstruction technique. COMPARISON:  April 07, 2017 FINDINGS: Brain: There is mild cerebral atrophy with widening of the extra-axial spaces and ventricular dilatation. There are areas of decreased attenuation within the white matter tracts of the supratentorial brain, consistent with microvascular disease changes. Chronic left basal ganglia lacunar infarcts are seen. Vascular: No hyperdense vessel or unexpected calcification. Skull: Normal. Negative for fracture or focal lesion. Sinuses/Orbits: No acute finding. Other: None. IMPRESSION: 1. No acute intracranial abnormality. 2. Generalized cerebral atrophy and microvascular disease changes of the supratentorial brain. 3. Chronic left basal ganglia lacunar infarcts. Electronically Signed   By: Virgina Norfolk M.D.   On: 12/08/2021 22:01   DG Chest Port 1 View  Result Date: 12/08/2021 CLINICAL DATA:  Questionable sepsis EXAM: PORTABLE CHEST - 1 VIEW COMPARISON:  06/12/2020 FINDINGS: Coarse perihilar interstitial  opacities, most marked in the left lower lung as before. Heart size and mediastinal contours are within normal limits. No effusion. Cervical fixation hardware noted.  Bilateral shoulder DJD. IMPRESSION: Persistent coarse perihilar interstitial opacities, most marked in the left lower lung. Electronically Signed   By: Lucrezia Europe M.D.   On: 12/08/2021 19:43      Time coordinating discharge: over 30 minutes  SIGNED:  Emeterio Reeve DO [facility]

## 2021-12-14 NOTE — TOC Progression Note (Signed)
Transition of Care [facility]) - Progression Note    Patient Details  Name: [name] MRN: [id number] Date of Birth: 09/24/1965  [facility]) CM/SW Contact  Eileen Stanford, LCSW Phone Number: 12/14/2021, 9:46 AM  Clinical Narrative:   Pt orginally declined SNF and HH. Pt submitted discharge appeal. Appeal was denied 12/1. CSW provided [place] letter to pt 12/1. In letter it states they notified pt by phone 12/1 at 6:18 PM. Pt states she wasn't aware. Pt states she will go to SNF. CSW explained we can do SNF workout however pt will be responsible for bill starting at 12:00 today. Pt says "never mind I can't afford that." Pt calling her husband for a ride home. CSW spoke with [facility] with [employer] and she said she is going to email the office to see if they can get the bed and lift delivered today. [place] was attempted the 30th however no one was home to accept.         Expected Discharge Plan and Services           Expected Discharge Date: 12/12/21                                     Social Determinants of Health (SDOH) Interventions    Readmission Risk Interventions     No data to display

## 2021-12-14 NOTE — Progress Notes (Signed)
Patient discharged at 11:02am via EMS. Assisted patient with getting dressed. PIV removed. No further needs.

## 2021-12-14 NOTE — TOC Transition Note (Signed)
Transition of Care [facility]) - CM/SW Discharge Note   Patient Details  Name: [name] MRN: [id number] Date of Birth: 27-Apr-1965  Transition of Care [facility]) CM/SW Contact:  Eileen Stanford, LCSW Phone Number: 12/14/2021, 10:15 AM   Clinical Narrative:   Pt dc home. EMS set up. RN notified.     Final next level of care: Home/Self Care Barriers to Discharge: No Barriers Identified   Patient Goals and CMS Choice        Discharge Placement                Patient to be transferred to facility by: [facility] Name of family member notified: Pt called her spouse Patient and family notified of of transfer: 12/14/21  Discharge Plan and Services                DME Arranged: Hospital bed ([place]) DME Agency: [facility] lift) Date DME Agency Contacted: 12/14/21 Time DME Agency Contacted: 0900 Representative spoke with at DME Agency: [place] ([place]) Interventions     Readmission Risk Interventions     No data to display

## 2021-12-16 ENCOUNTER — Telehealth: Payer: Self-pay | Admitting: *Deleted

## 2021-12-16 NOTE — Patient Outreach (Addendum)
  Care Coordination [facility] Note Transition Care Management Follow-up Telephone Call Date of discharge and from where: [facility] [phone] How have you been since you were released from the hospital? I am hurting real bad in my back. Per patient she has a big knot back there. RN discussed that if she is in severe pain she may want to consider going back to the hospital. Patient refused ( RN was going to ask husband to look at her back and describe what he sees if knot or a bandage. Patient hung up the phone. RN tried to call the patient back and she would not answer) RN called back again and talked with Husband. He said it is bruised and there is a knot. The Dr had seen it in the hospital and was not concerned per patient stated.  Any questions or concerns? Yes Patient has not received the bed. Very upset that she was discharged from the hospital.  Items Reviewed: Did the pt receive and understand the discharge instructions provided? Yes  Medications obtained and verified? No  Other? No  Any new allergies since your discharge? No  Dietary orders reviewed? Yes Do you have support at home? Yes Husband  Home Care and Equipment/Supplies: Were home health services ordered? no If so, what is the name of the agency? n  Has the agency set up a time to come to the patient's home? no Were any new equipment or medical supplies ordered?  Yes: Bed and Hoyer lift What is the name of the medical supply agency? [employer] Were you able to get the supplies/equipment? no Do you have any questions related to the use of the equipment or supplies? No Have not received yet. RN was going to call and find out where the bed will be delivered. The husband said they are waiting on the son to decide when she can go to [place]. The bed needs to be in [place] and then when she is coming back home then they will need to get another one here.  Per Husband she has 3 sons that are living in [place] and only one of him. She is to go  there for a month. RN asked about SNF and patient wanted to know why a skilled nursing facility. RN explained that it would be for rehab. Also that she needs help with her bathing and the meals and therapy.  Functional Questionnaire: (I = Independent and D = Dependent) ADLs: D  Bathing/Dressing- D  Meal Prep- D  Eating- D  Maintaining continence- D  Transferring/Ambulation- D  Managing Meds- D  Follow up appointments reviewed:  PCP Hospital f/u appt confirmed? No  Per patient she cannot see him because she can't walk.  [facility] f/u appt confirmed? No  . Are transportation arrangements needed? Yes When the time is needed to [place]. He is not sure how they will get her there.  If their condition worsens, is the pt aware to call PCP or go to the Emergency Dept.? Yes Was the patient provided with contact information for the PCP's office or ED? Yes Was to pt encouraged to call back with questions or concerns? Yes  SDOH assessments and interventions completed:   Yes   Care Coordination Interventions:  PCP follow up appointment requested RN explained how to get the [facility] meal plan initiated    Encounter Outcome:  Pt. Visit Completed    [place] Management [phone]

## 2021-12-16 NOTE — Progress Notes (Signed)
  Care Coordination  Note  12/16/2021 Name: JATIA MUSA MRN: [id number] DOB: [birth year]  [name] is a 56 y.o. year old primary care patient of Olin Hauser, DO. I reached out to [employer] by phone today to assist with scheduling a follow up appointment. [name] verbally consented to my assistance.       Follow up plan:  MyChart video f/u scheduled for 12/18/2021  Julian Hy, [place] Direct Dial: [phone]

## 2021-12-18 ENCOUNTER — Telehealth (INDEPENDENT_AMBULATORY_CARE_PROVIDER_SITE_OTHER): Payer: Medicare Other | Admitting: Family Medicine

## 2021-12-18 ENCOUNTER — Encounter: Payer: Self-pay | Admitting: Family Medicine

## 2021-12-18 ENCOUNTER — Ambulatory Visit: Payer: Self-pay | Admitting: *Deleted

## 2021-12-18 VITALS — Ht 66.0 in | Wt 239.0 lb

## 2021-12-18 DIAGNOSIS — I69351 Hemiplegia and hemiparesis following cerebral infarction affecting right dominant side: Secondary | ICD-10-CM | POA: Diagnosis not present

## 2021-12-18 DIAGNOSIS — R531 Weakness: Secondary | ICD-10-CM | POA: Insufficient documentation

## 2021-12-18 DIAGNOSIS — B379 Candidiasis, unspecified: Secondary | ICD-10-CM

## 2021-12-18 DIAGNOSIS — N3001 Acute cystitis with hematuria: Secondary | ICD-10-CM | POA: Diagnosis not present

## 2021-12-18 DIAGNOSIS — N390 Urinary tract infection, site not specified: Secondary | ICD-10-CM

## 2021-12-18 DIAGNOSIS — B9620 Unspecified Escherichia coli [E. coli] as the cause of diseases classified elsewhere: Secondary | ICD-10-CM

## 2021-12-18 DIAGNOSIS — I6930 Unspecified sequelae of cerebral infarction: Secondary | ICD-10-CM | POA: Diagnosis not present

## 2021-12-18 MED ORDER — FLUCONAZOLE 150 MG PO TABS: ORAL_TABLET | ORAL | 0 refills | Status: DC

## 2021-12-18 MED ORDER — SULFAMETHOXAZOLE-TRIMETHOPRIM 800-160 MG PO TABS: 1.0000 | ORAL_TABLET | Freq: Two times a day (BID) | ORAL | 0 refills | Status: AC

## 2021-12-18 NOTE — Addendum Note (Signed)
Addended by: Olin Hauser on: 12/18/2021 03:29 PM   Modules accepted: Orders

## 2021-12-18 NOTE — Patient Instructions (Addendum)
   Please schedule a Follow-up Appointment to: Return if symptoms worsen or fail to improve.  If you have any other questions or concerns, please feel free to call the office or send a message through MyChart. You may also schedule an earlier appointment if necessary.  Additionally, you may be receiving a survey about your experience at our office within a few days to 1 week by e-mail or mail. We value your feedback.  Karen Kinnard, DO [facility], [facility] 

## 2021-12-18 NOTE — Patient Outreach (Signed)
  Care Coordination   Initial Visit Note   12/18/2021 Name: [name] MRN: [id number] DOB: May 27, 1965  [name] is a 56 y.o. year old female who sees Olin Hauser, DO for primary care. I spoke with  [name] by phone today.  What matters to the patients health and wellness today?  Moving to my son's home in [place]             This Visit's Progress    Care Coordination needs       Care Coordination Interventions: CSW consult following patient's hospitalization from 12/08/21-12/14/21 and need for additional support post discharge Confirmed that patient was offered SNF while hospitalized, however initially declined-changed her mind,m however lost appeal for discharge and was discharged home Confirmed with patient that she is willing to move to [facility], [place] with son and daugther in law Confirmed with daughter in law Jonelle Sidle [phone] that the plan is for patient to now move in with her son and daughter in law [place], [place] [address] to provide ongoing care [place] and hospital bed will be delivered to the [facility] address Family requesting a new wheelchair, however patient was provided with a wheelchair in 2022, it was explained that Medicare only covers mobility scooters/wheelchairs every 5 years [place] orders requested, CSW to coordinate [facility] services in patient's new location          SDOH assessments and interventions completed:  Yes  SDOH Interventions Today    Flowsheet Row Most Recent Value  SDOH Interventions   Food Insecurity Interventions Intervention Not Indicated  Housing Interventions Intervention Not Indicated  Transportation Interventions Intervention Not Indicated        Care Coordination Interventions:  Yes, provided   Follow up plan: Follow up call scheduled for 12/23/21    Encounter Outcome:  Pt. Visit Completed

## 2021-12-18 NOTE — Patient Instructions (Signed)
Visit Information  Thank you for taking time to visit with me today. Please don't hesitate to contact me if I can be of assistance to you.   Following are the goals we discussed today:   Goals Addressed             This Visit's Progress    Care Coordination needs       Care Coordination Interventions: CSW consult following patient's hospitalization from 12/08/21-12/14/21 and need for additional support post discharge Confirmed that patient was offered SNF while hospitalized, however initially declined-changed her mind,m however lost appeal for discharge and was discharged home Confirmed with patient that she is willing to move to [facility], [place] with son and daugther in law Confirmed with daughter in law Jonelle Sidle [phone] that the plan is for patient to now move in with her son and daughter in law [place],  [phone] to provide ongoing care [place] and hospital bed will be delivered to the [facility] address Family requesting a new wheelchair, however patient was provided with a wheelchair in 2022, it was explained that Medicare only covers mobility scooters/wheelchairs every 5 years [place] orders requested, CSW to coordinate [facility] services in patient's new location          Our next appointment is by telephone on 12/23/21 at 10am  Please call the care guide team at [phone] if you need to cancel or reschedule your appointment.   If you are experiencing a Mental Health or [place] or need someone to talk to, please call 911   Patient verbalizes understanding of instructions and care plan provided today and agrees to view in [place]. Active MyChart status and patient understanding of how to access instructions and care plan via MyChart confirmed with patient.     Telephone follow up appointment with care management team member scheduled for: 12/23/21  Elliot Gurney, [place] Worker  [facility] Care  Management [phone]

## 2021-12-18 NOTE — Addendum Note (Signed)
Addended by: Olin Hauser on: 12/18/2021 03:41 PM   Modules accepted: Orders

## 2021-12-18 NOTE — Progress Notes (Addendum)
Subjective:    Patient ID: [name], female    DOB: Sep 29, 1965, 56 y.o.   MRN: [id number]  [name] is a 56 y.o. female presenting on 12/18/2021 for Hospitalization Follow-up  Virtual / Telehealth Encounter - Video Visit via [place] The purpose of this virtual visit is to provide medical care while limiting exposure to the novel coronavirus (COVID19) for both patient and office staff.  Consent was obtained for remote visit:  Yes.   Answered questions that patient had about telehealth interaction:  Yes.   I discussed the limitations, risks, security and privacy concerns of performing an evaluation and management service by video/telephone. I also discussed with the patient that there may be a patient responsible charge related to this service. The patient expressed understanding and agreed to proceed.  Patient Location: Home Provider Location: [facility] (Office)  Participants in virtual visit: - Patient: [name] and daughter in law [name] - CMA: Orinda Kenner, [place] - Provider: Dr Parks Ranger  [facility] FOLLOW-UP VISIT  Hospital/Location: [facility] Date of Admission: 12/08/21 Date of Discharge: 12/14/21 Transitions of care telephone call: Completed by Johny Shock RN 12/16/21  Reason for Admission: Acute Metabolic Enceophalopathy / UTI  - Hospital H&P and Discharge Summary have been reviewed - Patient presents today 4 days after recent hospitalization. Brief summary of recent course, patient had symptoms of acute confusion reduced appetite intake nausea vomiting, she was treated empirically for meningitis and had lumbar puncture, urinalysis done and abnormal E Coli given Ceftriaxone. Social work working with that her discharge plan was to discharge to skilled nursing facility at a rehab center but patient initial declined and then changed mind and they could not get her approved to stay in hospital to find the bed. She was discharged to home but  does not have home health. Now working with her family they  have goal to relocate her to SNF near daughter in law and will have her at home soon and has hospital bed.  Note they are requesting for FMLA for family member, Doren Custard, Judye's son, has FMLA / LOA they will send me paperwork   They will need social work assistance if planning to pursue SNF  Spot on back from lumbar puncture with bruises and discomfort  Now has persistent abdominal pain with pelvic / bladder symptoms urinary frequency dysuria, now has symptoms of UTI, previously treated then discontinued antibiotics no further continuation, urine culture shows E Coli UTI.  She is limited with her mobility difficulty ambulating due to weakness in lower extremities with setting of hospitalization and illness recently worsening. She normally is limited in wheelchair due to history of stroke and residual weakness deficit R sided hemiparesis. She needs assistance with ADLs mobility, hygiene, dressing, eating, and transportation / medication admin.   I have reviewed the discharge medication list, and have reconciled the current and discharge medications today.   Current Outpatient Medications:    acetic acid 2 % otic solution, Place 4 drops into the left ear 4 (four) times daily., Disp: 15 mL, Rfl: 0   albuterol (VENTOLIN HFA) 108 (90 Base) MCG/ACT inhaler, USE 2 INHALATIONS BY MOUTH EVERY 4 HOURS AS NEEDED FOR WHEEZING  OR SHORTNESS OF BREATH, Disp: 26.8 g, Rfl: 4   atorvastatin (LIPITOR) 40 MG tablet, TAKE 1 TABLET BY MOUTH AT  BEDTIME, Disp: 90 tablet, Rfl: 0   azelastine (ASTELIN) 0.1 % nasal spray, Place 2 sprays into both nostrils 2 (two) times daily.,  Disp: 30 mL, Rfl: 12   Buprenorphine HCl-Naloxone HCl 8-2 MG FILM, Place under the tongue 3 (three) times daily., Disp: , Rfl:    buPROPion (WELLBUTRIN XL) 150 MG 24 hr tablet, TAKE 1 TABLET BY MOUTH DAILY, Disp: 100 tablet, Rfl: 3   citalopram (CELEXA) 40 MG tablet, TAKE 1 TABLET BY  MOUTH DAILY, Disp: 100 tablet, Rfl: 3   clopidogrel (PLAVIX) 75 MG tablet, TAKE 1 TABLET BY MOUTH DAILY, Disp: 100 tablet, Rfl: 2   fluconazole (DIFLUCAN) 150 MG tablet, Take one tablet by mouth on Day 1. Repeat dose 2nd tablet on Day 3., Disp: 2 tablet, Rfl: 0   fluticasone (FLONASE) 50 MCG/ACT nasal spray, USE 2 SPRAYS IN BOTH NOSTRILS  DAILY USE FOR 4 TO 6 WEEKS, THEN STOP AND USE SEASONALLY OR AS  NEEDED, Disp: 48 g, Rfl: 0   gabapentin (NEURONTIN) 600 MG tablet, TAKE 1 TABLET(600 MG) BY MOUTH FOUR TIMES DAILY, Disp: 360 tablet, Rfl: 3   IBU 600 MG tablet, Take 600 mg by mouth every 12 (twelve) hours as needed., Disp: , Rfl:    metFORMIN (GLUCOPHAGE) 1000 MG tablet, TAKE 1 TABLET(1000 MG) BY MOUTH TWICE DAILY WITH A MEAL, Disp: 180 tablet, Rfl: 0   MOUNJARO 5 MG/0.5ML Pen, INJECT '5MG'$  UNDER THE SKIN ONCE A WEEK, Disp: 2 mL, Rfl: 2   pantoprazole (PROTONIX) 40 MG tablet, TAKE 1 TABLET BY MOUTH DAILY, Disp: 100 tablet, Rfl: 3   QUEtiapine (SEROQUEL) 100 MG tablet, TAKE 1 TABLET BY MOUTH AT  BEDTIME, Disp: 100 tablet, Rfl: 3   sulfamethoxazole-trimethoprim (BACTRIM DS) 800-160 MG tablet, Take 1 tablet by mouth 2 (two) times daily for 7 days., Disp: 14 tablet, Rfl: 0   topiramate (TOPAMAX) 100 MG tablet, TAKE 1 TABLET BY MOUTH TWICE  DAILY, Disp: 200 tablet, Rfl: 3   TRELEGY ELLIPTA 100-62.5-25 MCG/ACT AEPB, INHALE 1 PUFF INTO THE LUNGS DAILY, Disp: 60 each, Rfl: 11   Vitamin D, Ergocalciferol, (DRISDOL) 50000 units CAPS capsule, Take 50,000 Units by mouth every 7 (seven) days. On Wednesday, Disp: , Rfl:    dicyclomine (BENTYL) 20 MG tablet, Take 1 tablet (20 mg total) by mouth 4 (four) times daily -  before meals and at bedtime. As needed for abdominal pain cramping (Patient not taking: Reported on 12/09/2021), Disp: 60 tablet, Rfl: 2  ------------------------------------------------------------------------- Social History   Tobacco Use   Smoking status: Every Day    Packs/day: 1.00    Years:  20.00    Total pack years: 20.00    Types: Cigarettes   Smokeless tobacco: Never  Vaping Use   Vaping Use: Former  Substance Use Topics   Alcohol use: No   Drug use: Yes    Types: Marijuana    Comment: last smoked 2 days ago  8/4    Review of Systems Per HPI unless specifically indicated above     Objective:    Ht '5\' 6"'$  (1.676 m)   Wt 239 lb (108.4 kg)   BMI 38.58 kg/m   Wt Readings from Last 3 Encounters:  12/18/21 239 lb (108.4 kg)  12/11/21 239 lb 6.7 oz (108.6 kg)  11/20/21 235 lb (106.6 kg)    Physical Exam  Note examination was completely remotely via video observation objective data only  Gen - chronically ill-appearing, no acute distress or apparent pain, comfortable HEENT - eyes appear clear without discharge or redness Heart/Lungs - cannot examine virtually - observed no evidence of coughing or labored breathing. Abd - cannot  examine virtually  Skin - face visible today- no rash, did not examine her back site of LP but reports ecchymosis Neuro - awake, alert, oriented Psych - not anxious appearing   Results for orders placed or performed during the hospital encounter of 12/08/21  Blood Culture (routine x 2)   Specimen: BLOOD LEFT ARM  Result Value Ref Range   Specimen Description BLOOD LEFT ARM    Special Requests      BOTTLES DRAWN AEROBIC AND ANAEROBIC Blood Culture results may not be optimal due to an inadequate volume of blood received in culture bottles   Culture      NO GROWTH 5 DAYS Performed at [facility], [address] [place], [place] [address]    Report Status 12/13/2021 FINAL   Blood Culture (routine x 2)   Specimen: BLOOD RIGHT ARM  Result Value Ref Range   Specimen Description BLOOD RIGHT ARM    Special Requests      BOTTLES DRAWN AEROBIC AND ANAEROBIC Blood Culture results may not be optimal due to an inadequate volume of blood received in culture bottles   Culture      NO GROWTH 5 DAYS Performed at [facility], [address] [place], [place] [address]    Report Status 12/13/2021 FINAL   Urine Culture   Specimen: In/Out Cath Urine  Result Value Ref Range   Specimen Description      IN/OUT CATH URINE Performed at [facility], [address] [place], [place] [address]    Special Requests      NONE Performed at [facility], [address] [place], [place] [address]    Culture 20,000 COLONIES/mL ESCHERICHIA COLI (A)    Report Status 12/11/2021 FINAL    Organism ID, Bacteria ESCHERICHIA COLI (A)       Susceptibility   Escherichia coli - MIC*    AMPICILLIN >=32 RESISTANT Resistant     CEFAZOLIN <=4 SENSITIVE Sensitive     CEFEPIME <=0.12 SENSITIVE Sensitive     CEFTRIAXONE <=0.25 SENSITIVE Sensitive     CIPROFLOXACIN >=4 RESISTANT Resistant     GENTAMICIN <=1 SENSITIVE Sensitive     IMIPENEM <=0.25 SENSITIVE Sensitive     NITROFURANTOIN <=16 SENSITIVE Sensitive     TRIMETH/SULFA <=20 SENSITIVE Sensitive     AMPICILLIN/SULBACTAM 16 INTERMEDIATE Intermediate     PIP/TAZO <=4 SENSITIVE Sensitive     * 20,000 COLONIES/mL ESCHERICHIA COLI  Resp Panel by RT-PCR (Flu A&B, Covid) Anterior Nasal Swab   Specimen: Anterior Nasal Swab  Result Value Ref Range   SARS Coronavirus 2 by RT PCR NEGATIVE NEGATIVE   Influenza A by PCR NEGATIVE NEGATIVE   Influenza B by PCR NEGATIVE NEGATIVE  CSF culture w Gram Stain   Specimen: CSF; Cerebrospinal Fluid  Result Value Ref Range   Specimen Description      CSF Performed at [facility], [address] [place], [place] [address]    Special Requests      NONE Performed at [facility], [address] [place], [place] [address]    Gram Stain      WBC SEEN NO ORGANISMS SEEN RED BLOOD CELLS Performed at [facility], [address], [place], [place] [address]    Culture      NO GROWTH 3 DAYS Performed at [facility], [address] [place],  [address]    Report Status  12/13/2021 FINAL   Lactic acid, plasma  Result  Value Ref Range   Lactic Acid, Venous 2.1 (HH) 0.5 - 1.9 mmol/L  Comprehensive metabolic panel  Result Value Ref Range   Sodium 133 (L) 135 - 145 mmol/L   Potassium 3.7 3.5 - 5.1 mmol/L   Chloride 100 98 - 111 mmol/L   CO2 17 (L) 22 - 32 mmol/L   Glucose, Bld 150 (H) 70 - 99 mg/dL   BUN 19 6 - 20 mg/dL   Creatinine, Ser 0.74 0.44 - 1.00 mg/dL   Calcium 9.3 8.9 - 10.3 mg/dL   Total Protein 8.0 6.5 - 8.1 g/dL   Albumin 3.8 3.5 - 5.0 g/dL   AST 20 15 - 41 U/L   ALT 12 0 - 44 U/L   Alkaline Phosphatase 83 38 - 126 U/L   Total Bilirubin 1.1 0.3 - 1.2 mg/dL   GFR, Estimated >60 >60 mL/min   Anion gap 16 (H) 5 - 15  CBC with Differential  Result Value Ref Range   WBC 12.7 (H) 4.0 - 10.5 K/uL   RBC 5.69 (H) 3.87 - 5.11 MIL/uL   Hemoglobin 15.1 (H) 12.0 - 15.0 g/dL   HCT 46.3 (H) 36.0 - 46.0 %   MCV 81.4 80.0 - 100.0 fL   MCH 26.5 26.0 - 34.0 pg   MCHC 32.6 30.0 - 36.0 g/dL   RDW 13.9 11.5 - 15.5 %   Platelets 383 150 - 400 K/uL   nRBC 0.0 0.0 - 0.2 %   Neutrophils Relative % 77 %   Neutro Abs 9.8 (H) 1.7 - 7.7 K/uL   Lymphocytes Relative 17 %   Lymphs Abs 2.2 0.7 - 4.0 K/uL   Monocytes Relative 5 %   Monocytes Absolute 0.6 0.1 - 1.0 K/uL   Eosinophils Relative 0 %   Eosinophils Absolute 0.0 0.0 - 0.5 K/uL   Basophils Relative 0 %   Basophils Absolute 0.0 0.0 - 0.1 K/uL   Immature Granulocytes 1 %   Abs Immature Granulocytes 0.07 0.00 - 0.07 K/uL  Urinalysis, Complete w Microscopic  Result Value Ref Range   Color, Urine YELLOW (A) YELLOW   APPearance HAZY (A) CLEAR   Specific Gravity, Urine >1.046 (H) 1.005 - 1.030   pH 5.0 5.0 - 8.0   Glucose, UA NEGATIVE NEGATIVE mg/dL   Hgb urine dipstick NEGATIVE NEGATIVE   Bilirubin Urine NEGATIVE NEGATIVE   Ketones, ur 80 (A) NEGATIVE mg/dL   Protein, ur 30 (A) NEGATIVE mg/dL   Nitrite NEGATIVE NEGATIVE   Leukocytes,Ua TRACE (A) NEGATIVE   RBC / HPF 6-10 0 - 5 RBC/hpf   WBC, UA  0-5 0 - 5 WBC/hpf   Bacteria, UA RARE (A) NONE SEEN   Squamous Epithelial / LPF 6-10 0 - 5   Mucus PRESENT   Blood gas, venous  Result Value Ref Range   pH, Ven 7.43 7.25 - 7.43   pCO2, Ven 37 (L) 44 - 60 mmHg   pO2, Ven 37 32 - 45 mmHg   Bicarbonate 24.6 20.0 - 28.0 mmol/L   Acid-Base Excess 0.5 0.0 - 2.0 mmol/L   O2 Saturation 59.6 %   Patient temperature 37.0    Collection site VEIN   Lactic acid, plasma  Result Value Ref Range   Lactic Acid, Venous 1.6 0.5 - 1.9 mmol/L  Urine Drug Screen, Qualitative ([facility] only)  Result Value Ref Range   Tricyclic, Ur Screen POSITIVE (A) NONE DETECTED   Amphetamines, Ur Screen NONE DETECTED NONE DETECTED   MDMA (Ecstasy)Ur Screen  NONE DETECTED NONE DETECTED   Cocaine Metabolite,Ur [place] NONE DETECTED NONE DETECTED   Opiate, Ur Screen NONE DETECTED NONE DETECTED   Phencyclidine (PCP) Ur S NONE DETECTED NONE DETECTED   Cannabinoid 50 Ng, Ur [place] POSITIVE (A) NONE DETECTED   Barbiturates, Ur Screen NONE DETECTED NONE DETECTED   Benzodiazepine, Ur Scrn NONE DETECTED NONE DETECTED   Methadone Scn, Ur NONE DETECTED NONE DETECTED  Procalcitonin - Baseline  Result Value Ref Range   Procalcitonin <0.10 ng/mL  TSH  Result Value Ref Range   TSH 0.921 0.350 - 4.500 uIU/mL  APTT  Result Value Ref Range   aPTT 30 24 - 36 seconds  Protime-INR  Result Value Ref Range   Prothrombin Time 16.2 (H) 11.4 - 15.2 seconds   INR 1.3 (H) 0.8 - 1.2  Comprehensive metabolic panel  Result Value Ref Range   Sodium 134 (L) 135 - 145 mmol/L   Potassium 3.2 (L) 3.5 - 5.1 mmol/L   Chloride 101 98 - 111 mmol/L   CO2 20 (L) 22 - 32 mmol/L   Glucose, Bld 152 (H) 70 - 99 mg/dL   BUN 18 6 - 20 mg/dL   Creatinine, Ser 0.70 0.44 - 1.00 mg/dL   Calcium 8.9 8.9 - 10.3 mg/dL   Total Protein 6.9 6.5 - 8.1 g/dL   Albumin 3.3 (L) 3.5 - 5.0 g/dL   AST 15 15 - 41 U/L   ALT 9 0 - 44 U/L   Alkaline Phosphatase 70 38 - 126 U/L   Total Bilirubin 0.9 0.3 - 1.2 mg/dL   GFR,  Estimated >60 >60 mL/min   Anion gap 13 5 - 15  CBC  Result Value Ref Range   WBC 14.0 (H) 4.0 - 10.5 K/uL   RBC 5.09 3.87 - 5.11 MIL/uL   Hemoglobin 14.0 12.0 - 15.0 g/dL   HCT 41.4 36.0 - 46.0 %   MCV 81.3 80.0 - 100.0 fL   MCH 27.5 26.0 - 34.0 pg   MCHC 33.8 30.0 - 36.0 g/dL   RDW 13.9 11.5 - 15.5 %   Platelets 396 150 - 400 K/uL   nRBC 0.0 0.0 - 0.2 %  Hemoglobin A1c  Result Value Ref Range   Hgb A1c MFr Bld 6.7 (H) 4.8 - 5.6 %   Mean Plasma Glucose 146 mg/dL  CSF cell count with differential collection tube #: 1  Result Value Ref Range   Tube # 1    Color, CSF PINK (A) COLORLESS   Appearance, CSF HAZY (A) CLEAR   Supernatant COLORLESS    RBC Count, CSF 2,848 (H) 0 - 3 /cu mm   WBC, CSF 184 (HH) 0 - 5 /cu mm   Segmented Neutrophils-CSF 38 %   Lymphs, CSF 43 %   Monocyte-Macrophage-Spinal Fluid 19 %   Eosinophils, CSF 0 %  CSF cell count with differential  Result Value Ref Range   Tube # 1    Color, CSF PINK (A) COLORLESS   Appearance, CSF HAZY (A) CLEAR   Supernatant COLORLESS    RBC Count, CSF 1,987 (H) 0 - 3 /cu mm   WBC, CSF 126 (HH) 0 - 5 /cu mm   Segmented Neutrophils-CSF 58 %   Lymphs, CSF 15 %   Monocyte-Macrophage-Spinal Fluid 27 %   Eosinophils, CSF 0 %  Protein and glucose, CSF  Result Value Ref Range   Glucose, CSF 84 (H) 40 - 70 mg/dL   Total  Protein, CSF 78 (H) 15 -  45 mg/dL  Meningitis/Encephalitis Panel (CSF)  Result Value Ref Range   Cryptococcus neoformans/gattii (CSF) NOT DETECTED NOT DETECTED   Cytomegalovirus (CSF) NOT DETECTED NOT DETECTED   Enterovirus (CSF) NOT DETECTED NOT DETECTED   Escherichia coli K1 (CSF) NOT DETECTED NOT DETECTED   Haemophilus influenzae (CSF) NOT DETECTED NOT DETECTED   Herpes simplex virus 1 (CSF) NOT DETECTED NOT DETECTED   Herpes simplex virus 2 (CSF) NOT DETECTED NOT DETECTED   Human herpesvirus 6 (CSF) NOT DETECTED NOT DETECTED   Human parechovirus (CSF) NOT DETECTED NOT DETECTED   Listeria monocytogenes  (CSF) NOT DETECTED NOT DETECTED   Neisseria meningitis (CSF) NOT DETECTED NOT DETECTED   Streptococcus agalactiae (CSF) NOT DETECTED NOT DETECTED   Streptococcus pneumoniae (CSF) NOT DETECTED NOT DETECTED   Varicella zoster virus (CSF) NOT DETECTED NOT DETECTED  VDRL, CSF  Result Value Ref Range   VDRL Quant, CSF Non Reactive Non Rea:<1:1  Creatinine, serum  Result Value Ref Range   Creatinine, Ser 0.62 0.44 - 1.00 mg/dL   GFR, Estimated >60 >60 mL/min  CBC with Differential/Platelet  Result Value Ref Range   WBC 8.7 4.0 - 10.5 K/uL   RBC 5.15 (H) 3.87 - 5.11 MIL/uL   Hemoglobin 13.7 12.0 - 15.0 g/dL   HCT 42.0 36.0 - 46.0 %   MCV 81.6 80.0 - 100.0 fL   MCH 26.6 26.0 - 34.0 pg   MCHC 32.6 30.0 - 36.0 g/dL   RDW 14.2 11.5 - 15.5 %   Platelets 314 150 - 400 K/uL   nRBC 0.0 0.0 - 0.2 %   Neutrophils Relative % 56 %   Neutro Abs 4.9 1.7 - 7.7 K/uL   Lymphocytes Relative 32 %   Lymphs Abs 2.8 0.7 - 4.0 K/uL   Monocytes Relative 8 %   Monocytes Absolute 0.7 0.1 - 1.0 K/uL   Eosinophils Relative 2 %   Eosinophils Absolute 0.2 0.0 - 0.5 K/uL   Basophils Relative 1 %   Basophils Absolute 0.1 0.0 - 0.1 K/uL   Immature Granulocytes 1 %   Abs Immature Granulocytes 0.05 0.00 - 0.07 K/uL  Basic metabolic panel  Result Value Ref Range   Sodium 139 135 - 145 mmol/L   Potassium 2.5 (LL) 3.5 - 5.1 mmol/L   Chloride 103 98 - 111 mmol/L   CO2 26 22 - 32 mmol/L   Glucose, Bld 119 (H) 70 - 99 mg/dL   BUN 12 6 - 20 mg/dL   Creatinine, Ser 0.71 0.44 - 1.00 mg/dL   Calcium 8.9 8.9 - 10.3 mg/dL   GFR, Estimated >60 >60 mL/min   Anion gap 10 5 - 15  HIV Antibody (routine testing w rflx)  Result Value Ref Range   HIV Screen 4th Generation wRfx Non Reactive Non Reactive  Magnesium  Result Value Ref Range   Magnesium 2.1 1.7 - 2.4 mg/dL  Glucose, capillary  Result Value Ref Range   Glucose-Capillary 124 (H) 70 - 99 mg/dL  Glucose, capillary  Result Value Ref Range   Glucose-Capillary 119  (H) 70 - 99 mg/dL  Glucose, capillary  Result Value Ref Range   Glucose-Capillary 182 (H) 70 - 99 mg/dL  Basic metabolic panel  Result Value Ref Range   Sodium 137 135 - 145 mmol/L   Potassium 3.6 3.5 - 5.1 mmol/L   Chloride 105 98 - 111 mmol/L   CO2 23 22 - 32 mmol/L   Glucose, Bld 130 (H) 70 - 99 mg/dL  BUN 12 6 - 20 mg/dL   Creatinine, Ser 0.75 0.44 - 1.00 mg/dL   Calcium 8.6 (L) 8.9 - 10.3 mg/dL   GFR, Estimated >60 >60 mL/min   Anion gap 9 5 - 15  Magnesium  Result Value Ref Range   Magnesium 2.0 1.7 - 2.4 mg/dL  Glucose, capillary  Result Value Ref Range   Glucose-Capillary 152 (H) 70 - 99 mg/dL  Glucose, capillary  Result Value Ref Range   Glucose-Capillary 148 (H) 70 - 99 mg/dL  Glucose, capillary  Result Value Ref Range   Glucose-Capillary 131 (H) 70 - 99 mg/dL  Glucose, capillary  Result Value Ref Range   Glucose-Capillary 117 (H) 70 - 99 mg/dL  Glucose, capillary  Result Value Ref Range   Glucose-Capillary 135 (H) 70 - 99 mg/dL  Glucose, capillary  Result Value Ref Range   Glucose-Capillary 227 (H) 70 - 99 mg/dL  Glucose, capillary  Result Value Ref Range   Glucose-Capillary 126 (H) 70 - 99 mg/dL  Glucose, capillary  Result Value Ref Range   Glucose-Capillary 91 70 - 99 mg/dL  Glucose, capillary  Result Value Ref Range   Glucose-Capillary 182 (H) 70 - 99 mg/dL  Glucose, capillary  Result Value Ref Range   Glucose-Capillary 116 (H) 70 - 99 mg/dL  Glucose, capillary  Result Value Ref Range   Glucose-Capillary 118 (H) 70 - 99 mg/dL  Glucose, capillary  Result Value Ref Range   Glucose-Capillary 140 (H) 70 - 99 mg/dL  Glucose, capillary  Result Value Ref Range   Glucose-Capillary 184 (H) 70 - 99 mg/dL  Glucose, capillary  Result Value Ref Range   Glucose-Capillary 152 (H) 70 - 99 mg/dL  Glucose, capillary  Result Value Ref Range   Glucose-Capillary 138 (H) 70 - 99 mg/dL  Glucose, capillary  Result Value Ref Range   Glucose-Capillary 157 (H) 70  - 99 mg/dL  Glucose, capillary  Result Value Ref Range   Glucose-Capillary 159 (H) 70 - 99 mg/dL  Glucose, capillary  Result Value Ref Range   Glucose-Capillary 141 (H) 70 - 99 mg/dL  CBG monitoring, ED  Result Value Ref Range   Glucose-Capillary 163 (H) 70 - 99 mg/dL  CBG monitoring, ED  Result Value Ref Range   Glucose-Capillary 146 (H) 70 - 99 mg/dL  CBG monitoring, ED  Result Value Ref Range   Glucose-Capillary 137 (H) 70 - 99 mg/dL  CBG monitoring, ED  Result Value Ref Range   Glucose-Capillary 130 (H) 70 - 99 mg/dL   Comment 1 Notify RN    Comment 2 Document in Chart   CBG monitoring, ED  Result Value Ref Range   Glucose-Capillary 108 (H) 70 - 99 mg/dL  CBG monitoring, ED  Result Value Ref Range   Glucose-Capillary 118 (H) 70 - 99 mg/dL  CBG monitoring, ED  Result Value Ref Range   Glucose-Capillary 113 (H) 70 - 99 mg/dL  CBG monitoring, ED  Result Value Ref Range   Glucose-Capillary 143 (H) 70 - 99 mg/dL  CBG monitoring, ED  Result Value Ref Range   Glucose-Capillary 120 (H) 70 - 99 mg/dL  CBG monitoring, ED  Result Value Ref Range   Glucose-Capillary 139 (H) 70 - 99 mg/dL  CBG monitoring, ED  Result Value Ref Range   Glucose-Capillary 135 (H) 70 - 99 mg/dL  CBG monitoring, ED  Result Value Ref Range   Glucose-Capillary 101 (H) 70 - 99 mg/dL  CBG monitoring, ED  Result Value Ref Range  Glucose-Capillary 124 (H) 70 - 99 mg/dL      Assessment & Plan:   Problem List Items Addressed This Visit     E. coli UTI   Relevant Medications   sulfamethoxazole-trimethoprim (BACTRIM DS) 800-160 MG tablet   fluconazole (DIFLUCAN) 150 MG tablet   Generalized weakness - Primary   Hemiparesis of right dominant side as late effect of cerebrovascular disease ([place])   History of cerebrovascular accident (CVA) with residual deficit   Other Visit Diagnoses     Acute cystitis with hematuria       Relevant Medications   sulfamethoxazole-trimethoprim (BACTRIM DS) 800-160  MG tablet   Antibiotic-induced yeast infection       Relevant Medications   sulfamethoxazole-trimethoprim (BACTRIM DS) 800-160 MG tablet   fluconazole (DIFLUCAN) 150 MG tablet       HFU  Virtual today cannot add labs as requested by hospital discharge  E Coli UTI Unresolved, now more symptoms. Will order Bactrim DS TWICE A DAY x 7 day course Urine culture reviewed sensitivities Add Diflucan for antibiotic yeast infection  Acute metabolic Encephalopathy - resolved May have been related to UTI  Generalized Weakness - worsening with recent illness hospital stay History of CVA w/ R sided residual deficit hemiparesis weakness Limiting her mobility and function  Currently overall functional decline with difficulty with performing her ADLs, based on current situation she is not able to care for her self at home. She has needed assistance from her son and daughter in law as caretakers for her, and they are working toward having her move in and have hospital bed. She will likely first benefit from [place] temporarily to improve her function and receive higher level of care and PATIENT.  Coordinating with [place] LCSW to discuss case and will pursue SNF admit if possible, will need FL2 and authorization.  Strict return precautions given if worsening symptoms severity of symptoms not improving, may need to seek care at hospital ED again if cannot manage at home.  Orders Placed This Encounter  Procedures   Ambulatory referral to [place]    Referral Priority:   Routine    Referral Type:   [place]    Referral Reason:   Specialty Services Required    Requested Specialty:   [place]    Number of Visits Requested:   1   AMB Referral to [place] ([facility])    Referral Priority:   Routine    Referral Type:   Consultation    Referral Reason:   Care Coordination    Number of Visits Requested:   1    NOTE update - spoke with  Chrystal Land LCSW and she has contacted family daughter in law and patient, and the plan is to move [place] in with them ASAP and they have hospital bed and are ordering hoyer lift and need Home Health orders RN P T and OT to their address at this time. Orders placed.  Meds ordered this encounter  Medications   sulfamethoxazole-trimethoprim (BACTRIM DS) 800-160 MG tablet    Sig: Take 1 tablet by mouth 2 (two) times daily for 7 days.    Dispense:  14 tablet    Refill:  0   fluconazole (DIFLUCAN) 150 MG tablet    Sig: Take one tablet by mouth on Day 1. Repeat dose 2nd tablet on Day 3.    Dispense:  2 tablet    Refill:  0    Follow  up plan: Return if symptoms worsen or fail to improve.   Nobie Putnam, DO [facility] 12/18/2021, 11:59 AM

## 2021-12-23 ENCOUNTER — Encounter: Payer: Self-pay | Admitting: Family Medicine

## 2021-12-23 ENCOUNTER — Ambulatory Visit: Payer: Self-pay | Admitting: *Deleted

## 2021-12-23 NOTE — Patient Outreach (Signed)
  Care Coordination   Follow Up Visit Note   12/23/2021 Name: SALISA BROZ MRN: [id number] DOB: Sep 26, 1965  Gwenith Spitz is a 56 y.o. year old female who sees Olin Hauser, DO for primary care. I spoke with  Marian Sorrow daughter in law by phone today.  What matters to the patients health and wellness today?  [place] services post move to son and daughter in laws home    Goals Addressed             This Visit's Progress    Care Coordination needs       Care Coordination Interventions: Follow up phone call to patient's daughter in-law [name] regarding requested DME and [place]egino [place] follow up Discussed continued plan to move to [facility] with her son and daughter in law Explained that patient is not eligible for a new wheelchair as she was provided a new wheelchair in 2022-it was explained that Medicare will only cover mobility devices every 7 years CSW consulted with referrals coordinator, explained that patient's information for [facility] services was sent to [facility] in [place] pending  This social worker will continue to follow          SDOH assessments and interventions completed:  No     Care Coordination Interventions:  Yes, provided   Follow up plan: Follow up call scheduled for 12/30/21    Encounter Outcome:  Pt. Visit Completed

## 2021-12-23 NOTE — Patient Instructions (Signed)
Visit Information  Thank you for taking time to visit with me today. Please don't hesitate to contact me if I can be of assistance to you.   Following are the goals we discussed today:   Goals Addressed             This Visit's Progress    Care Coordination needs       Care Coordination Interventions: Follow up phone call to patient's daughter in-law [name] regarding requested DME and [place] follow up Discussed continued plan to move to [facility] with her son and daughter in law Explained that patient is not eligible for a new wheelchair as she was provided a new wheelchair in 2022-it was explained that Medicare will only cover mobility devices every 7 years CSW consulted with referrals coordinator, explained that patient's information for [facility] services was sent to [facility] in [place] pending  Discussed need for patient to get established with a new provided once she has officially moved with son and daughter in law This social worker will continue to follow          Our next appointment is by telephone on 12/30/21 at 2pm  Please call the care guide team at [phone] if you need to cancel or reschedule your appointment.   If you are experiencing a Mental Health or [place] or need someone to talk to, please call 911   Patient verbalizes understanding of instructions and care plan provided today and agrees to view in [place]. Active MyChart status and patient understanding of how to access instructions and care plan via MyChart confirmed with patient.     Telephone follow up appointment with care management team member scheduled for: 12/30/21  Elliot Gurney, [place] Worker  [facility] Care Management [phone]

## 2021-12-24 DIAGNOSIS — Z79891 Long term (current) use of opiate analgesic: Secondary | ICD-10-CM | POA: Diagnosis not present

## 2021-12-24 DIAGNOSIS — M961 Postlaminectomy syndrome, not elsewhere classified: Secondary | ICD-10-CM | POA: Diagnosis not present

## 2021-12-24 DIAGNOSIS — M25561 Pain in right knee: Secondary | ICD-10-CM | POA: Diagnosis not present

## 2021-12-24 DIAGNOSIS — M545 Low back pain, unspecified: Secondary | ICD-10-CM | POA: Diagnosis not present

## 2021-12-24 DIAGNOSIS — I69351 Hemiplegia and hemiparesis following cerebral infarction affecting right dominant side: Secondary | ICD-10-CM | POA: Diagnosis not present

## 2021-12-24 DIAGNOSIS — F17210 Nicotine dependence, cigarettes, uncomplicated: Secondary | ICD-10-CM | POA: Diagnosis not present

## 2021-12-24 NOTE — Telephone Encounter (Signed)
See other mychart message  Nobie Putnam, DO [facility] 12/24/2021, 6:13 PM

## 2021-12-26 DIAGNOSIS — I639 Cerebral infarction, unspecified: Secondary | ICD-10-CM | POA: Diagnosis not present

## 2021-12-26 DIAGNOSIS — Z993 Dependence on wheelchair: Secondary | ICD-10-CM | POA: Diagnosis not present

## 2021-12-26 DIAGNOSIS — R54 Age-related physical debility: Secondary | ICD-10-CM | POA: Diagnosis not present

## 2021-12-26 DIAGNOSIS — J449 Chronic obstructive pulmonary disease, unspecified: Secondary | ICD-10-CM | POA: Diagnosis not present

## 2021-12-26 DIAGNOSIS — M545 Low back pain, unspecified: Secondary | ICD-10-CM | POA: Diagnosis not present

## 2021-12-29 ENCOUNTER — Other Ambulatory Visit: Payer: Self-pay | Admitting: Family Medicine

## 2021-12-29 DIAGNOSIS — E1169 Type 2 diabetes mellitus with other specified complication: Secondary | ICD-10-CM

## 2021-12-30 ENCOUNTER — Ambulatory Visit: Payer: Medicare Other | Admitting: Pharmacist

## 2021-12-30 ENCOUNTER — Ambulatory Visit: Payer: Self-pay

## 2021-12-30 ENCOUNTER — Ambulatory Visit: Payer: Self-pay | Admitting: *Deleted

## 2021-12-30 ENCOUNTER — Other Ambulatory Visit: Payer: Self-pay | Admitting: Family Medicine

## 2021-12-30 DIAGNOSIS — E1169 Type 2 diabetes mellitus with other specified complication: Secondary | ICD-10-CM

## 2021-12-30 DIAGNOSIS — N3001 Acute cystitis with hematuria: Secondary | ICD-10-CM

## 2021-12-30 MED ORDER — SULFAMETHOXAZOLE-TRIMETHOPRIM 800-160 MG PO TABS: 1.0000 | ORAL_TABLET | Freq: Two times a day (BID) | ORAL | 0 refills | Status: DC

## 2021-12-30 NOTE — Telephone Encounter (Signed)
     Chief Complaint: Low back pain, currently trying to "get in with pain management." Requesting pain medication Symptoms: Pain Frequency: Last month Pertinent Negatives: Patient denies  Disposition: '[]'$ ED /'[]'$ Urgent Care (no appt availability in office) / '[]'$ Appointment(In office/virtual)/ '[]'$  [place] [facility]/ '[]'$ Home Care/ '[]'$ Refused Recommended Disposition /'[]'$ [place] Mobile [facility]/ '[x]'$  Follow-up with PCP Additional Notes: Requests medication until she can be seen in pain management.Asking for Percocet 15 mg. Using [facility] in [place], [place]. [phone].  Answer Assessment - Initial Assessment Questions 1. ONSET: "When did the pain begin?"      3 weeks ago 2. LOCATION: "Where does it hurt?" (upper, mid or lower back)     Lower back 3. SEVERITY: "How bad is the pain?"  (e.g., Scale 1-10; mild, moderate, or severe)   - MILD (1-3): Doesn't interfere with normal activities.    - MODERATE (4-7): Interferes with normal activities or awakens from sleep.    - SEVERE (8-10): Excruciating pain, unable to do any normal activities.      9 4. PATTERN: "Is the pain constant?" (e.g., yes, no; constant, intermittent)      Constant 5. RADIATION: "Does the pain shoot into your legs or somewhere else?"     Legs 6. CAUSE:  "What do you think is causing the back pain?"      Spinal tap 7. BACK OVERUSE:  "Any recent lifting of heavy objects, strenuous work or exercise?"     No 8. MEDICINES: "What have you taken so far for the pain?" (e.g., nothing, acetaminophen, NSAIDS)     Nothing 9. NEUROLOGIC SYMPTOMS: "Do you have any weakness, numbness, or problems with bowel/bladder control?"     No 10. OTHER SYMPTOMS: "Do you have any other symptoms?" (e.g., fever, abdomen pain, burning with urination, blood in urine)       No 11. PREGNANCY: "Is there any chance you are pregnant?" "When was your last menstrual period?"       No  Protocols used: Back Pain-A-AH

## 2021-12-30 NOTE — Patient Instructions (Signed)
Visit Information  Thank you for taking time to visit with me today. Please don't hesitate to contact me if I can be of assistance to you.   Following are the goals we discussed today:   Goals Addressed             This Visit's Progress    Care Coordination needs       Care Coordination Interventions: Follow up phone call to patient and patient's daughter in-law [name] regarding requested DME and [place] follow up Patient now residing in [facility] with her son and daughter in law Alvis [facility] will be following patient and will be at the home on Wednesday for initial intake Patient's daughter in law confirms that patient has a wheelchair, hospital bed, hoyer lift and bedside commode. Patient's daughter in law looking to purchase  a shower chair as well Patient needing another  prescription filled for Bactrim-per patient spouse did not pick prescription up- new prescritption requested by pharmacist to be re-ordered to the [place] on [address] in [facility] This social worker will continue to follow for community resource needs         Our next appointment is by telephone on 01/14/22 at 1pm  Please call the care guide team at [phone] if you need to cancel or reschedule your appointment.   If you are experiencing a Mental Health or [place] or need someone to talk to, please call 911   Patient verbalizes understanding of instructions and care plan provided today and agrees to view in [place]. Active MyChart status and patient understanding of how to access instructions and care plan via MyChart confirmed with patient.     Telephone follow up appointment with care management team member scheduled for:02/05/22  Elliot Gurney, [place] Worker  [facility] Care Management [phone]

## 2021-12-30 NOTE — Patient Instructions (Signed)
Goals Addressed             This Visit's Progress    Pharmacy Goals       Our goal A1c is less than 7%. This corresponds with fasting sugars less than 130 and 2 hour after meal sugars less than 180. Please check your blood sugar, keep a log of the results and have this for [place] to review during our next telephone appointment  Our goal bad cholesterol, or LDL, is less than 70 . This is why it is important to continue taking your atorvastatin  Please consider calling the [place] Quitline for their help with quitting smoking.  The [place] Quitline phone number is: [phone]  Feel free to call me with any questions or concerns. I look forward to our next call!  Wallace Cullens, PharmD, [place], CPP Clinical Pharmacist [facility] [phone]

## 2021-12-30 NOTE — Telephone Encounter (Signed)
Requested Prescriptions  Pending Prescriptions Disp Refills   metFORMIN (GLUCOPHAGE) 1000 MG tablet [Pharmacy Med Name: METFORMIN 1000MG TABLETS] 180 tablet 1    Sig: TAKE 1 TABLET(1000 MG) BY MOUTH TWICE DAILY WITH A MEAL     Endocrinology:  Diabetes - Biguanides Failed - 12/29/2021  1:47 PM      Failed - B12 Level in normal range and within 720 days    Vitamin B-12  Date Value Ref Range Status  04/07/2017 252 180 - 914 pg/mL Final    Comment:    (NOTE) This assay is not validated for testing neonatal or myeloproliferative syndrome specimens for Vitamin B12 levels. Performed at [facility], [place] [address], [place], [place] [address]          Passed - Cr in normal range and within 360 days    Creat  Date Value Ref Range Status  05/15/2021 0.62 0.50 - 1.03 mg/dL Final   Creatinine, Ser  Date Value Ref Range Status  12/11/2021 0.75 0.44 - 1.00 mg/dL Final         Passed - HBA1C is between 0 and 7.9 and within 180 days    Hgb A1c MFr Bld  Date Value Ref Range Status  12/09/2021 6.7 (H) 4.8 - 5.6 % Final    Comment:    (NOTE)         Prediabetes: 5.7 - 6.4         Diabetes: >6.4         Glycemic control for adults with diabetes: <7.0          Passed - eGFR in normal range and within 360 days    EGFR (African American)  Date Value Ref Range Status  09/22/2013 >60  Final   GFR calc Af Amer  Date Value Ref Range Status  08/18/2017 >60 >60 mL/min Final    Comment:    (NOTE) The eGFR has been calculated using the CKD EPI equation. This calculation has not been validated in all clinical situations. eGFR's persistently <60 mL/min signify possible Chronic Kidney Disease.    EGFR (Non-African Amer.)  Date Value Ref Range Status  09/22/2013 >60  Final    Comment:    eGFR values <11m/min/1.73 m2 may be an indication of chronic kidney disease (CKD). Calculated eGFR is useful in patients with stable renal function. The eGFR calculation will not be reliable in  acutely ill patients when serum creatinine is changing rapidly. It is not useful in  patients on dialysis. The eGFR calculation may not be applicable to patients at the low and high extremes of body sizes, pregnant women, and vegetarians.    GFR, Estimated  Date Value Ref Range Status  12/11/2021 >60 >60 mL/min Final    Comment:    (NOTE) Calculated using the CKD-EPI Creatinine Equation (2021)    GFR  Date Value Ref Range Status  09/23/2017 91.12 >60.00 mL/min Final   eGFR  Date Value Ref Range Status  05/15/2021 104 > OR = 60 mL/min/1.725mFinal    Comment:    The eGFR is based on the CKD-EPI 2021 equation. To calculate  the new eGFR from a previous Creatinine or Cystatin C result, go to https://www.kidney.org/professionals/ kdoqi/gfr%5Fcalculator          Passed - Valid encounter within last 6 months    Recent Outpatient Visits           Today Type 2 diabetes mellitus with other specified complication, without long-term current use  of insulin ([place])   [place], Grayland Ormond A, RPH-CPP   1 week ago History of cerebrovascular accident (CVA) with residual deficit   [facility] [place], Devonne Doughty, DO   1 month ago Oral thrush   [facility] [place], Coralie Keens, NP   1 month ago Type 2 diabetes mellitus with other specified complication, without long-term current use of insulin ([place])   [place], Grayland Ormond A, RPH-CPP   2 months ago Type 2 diabetes mellitus with other specified complication, without long-term current use of insulin ([place])   [facility] Delles, Grayland Ormond A, RPH-CPP              Passed - CBC within normal limits and completed in the last 12 months    WBC  Date Value Ref Range Status  12/11/2021 8.7 4.0 - 10.5 K/uL Final   RBC  Date Value Ref Range Status  12/11/2021 5.15 (H) 3.87 - 5.11 MIL/uL Final   Hemoglobin  Date Value Ref Range Status  12/11/2021  13.7 12.0 - 15.0 g/dL Final   HGB  Date Value Ref Range Status  09/22/2013 14.6 12.0 - 16.0 g/dL Final   HCT  Date Value Ref Range Status  12/11/2021 42.0 36.0 - 46.0 % Final  09/22/2013 44.3 35.0 - 47.0 % Final   MCHC  Date Value Ref Range Status  12/11/2021 32.6 30.0 - 36.0 g/dL Final   [facility]  Date Value Ref Range Status  12/11/2021 26.6 26.0 - 34.0 pg Final   MCV  Date Value Ref Range Status  12/11/2021 81.6 80.0 - 100.0 fL Final  09/22/2013 90 80 - 100 fL Final   No results found for: "PLTCOUNTKUC", "LABPLAT", "POCPLA" RDW  Date Value Ref Range Status  12/11/2021 14.2 11.5 - 15.5 % Final  09/22/2013 13.8 11.5 - 14.5 % Final

## 2021-12-30 NOTE — Chronic Care Management (AMB) (Signed)
12/30/2021 Name: [name] MRN: [id number] DOB: 06/23/1965  Chief Complaint  Patient presents with   Medication Management    [name] is a 56 y.o. year old female who presented for a telephone visit.   They were referred to the pharmacist by their PCP for assistance in managing diabetes and complex medication management.    Subjective:  Care Team: Primary Care Provider: Olin Hauser, DO  [facility] Management: [facility] in [place] Social Worker: Elliot Gurney, [place]; Next Scheduled Visit: 12/30/2021  Medication Access/Adherence  Current Pharmacy:  [facility] [place], [address] AT [place] [address] Phone: [phone] Fax: [place], [address] [place] [address] Phone: [phone] Fax: [place], [place] - [address] [place] [address] [address] Phone: [phone] Fax: [phone]   Perform chart review. Note patient completed Video Visit with PCP on 12/6 for post-hospitalization follow up - Patient [facility] 11/26-12/02/2021 related to Acute Metabolic Encephalopathy / UTI - Provider advised patient Will order Bactrim DS TWICE A DAY x 7 day course  Add Diflucan for antibiotic yeast infection  Note patient collaborating with [facility] LCSW regarding patient's home care needs  Today patient reports she completed course of Diflucan as prescribed, but denies having received/taken course of Bactrim DS. Reports continues to have symptoms of yeast infection, itching and discharge. States she is currently in [facility] and her family is caring for her. Michela Pitcher that she is expecting to hear from [facility] today regarding  setting up PT/OT.   Follow up with [facility] in [place] Pharmacist advises Bactrim DS Rx was picked up on 12/6.    Type 2 Diabetes:   Current medications:  Metformin 1000 mg twice daily Mounjaro 5 mg weekly on Wednesdays   Medications tried in the past: Tradjenta, Tyler Aas   Denies checking her blood sugar since left the hospital    Current physical activity: patient currently unable to walk/stand up   Objective: Lab Results  Component Value Date   HGBA1C 6.7 (H) 12/09/2021    Lab Results  Component Value Date   CREATININE 0.75 12/11/2021   BUN 12 12/11/2021   NA 137 12/11/2021   K 3.6 12/11/2021   CL 105 12/11/2021   CO2 23 12/11/2021    Lab Results  Component Value Date   CHOL 136 04/07/2017   HDL 31 (L) 04/07/2017   LDLCALC 67 04/07/2017   TRIG 191 (H) 04/07/2017   CHOLHDL 4.4 04/07/2017    Medications Reviewed Today     Reviewed by Olin Hauser, DO (Physician) on 12/18/21 at 1317  Med List Status: <None>   Medication Order Taking? Sig Documenting Provider Last Dose Status Informant  acetic acid 2 % otic solution [id number] Yes Place 4 drops into the left ear 4 (four) times daily. Emeterio Reeve, DO Taking Active   albuterol (VENTOLIN HFA) 108 (90 Base) MCG/ACT inhaler [id number] Yes USE 2 INHALATIONS BY MOUTH EVERY 4 HOURS AS NEEDED FOR WHEEZING  OR SHORTNESS OF BREATH Olin Hauser, DO Taking Active Pharmacy Records  atorvastatin (LIPITOR) 40 MG tablet [id number] Yes TAKE 1 TABLET BY MOUTH AT  BEDTIME Olin Hauser, DO Taking Active Pharmacy Records  azelastine (ASTELIN) 0.1 % nasal spray [id number] Yes Place 2 sprays into both nostrils 2 (two) times daily. Olin Hauser, DO Taking Active Pharmacy Records  Buprenorphine HCl-Naloxone HCl 8-2 MG FILM [id number] Yes Place under the tongue 3 (three) times daily. [provider] Taking Active Pharmacy Records  buPROPion [facility] XL) 150 MG  24 hr tablet [id number] Yes TAKE 1 TABLET BY MOUTH DAILY Olin Hauser, DO Taking Active Pharmacy Records  citalopram (CELEXA) 40 MG tablet [id number] Yes TAKE 1 TABLET BY MOUTH DAILY Olin Hauser, DO Taking Active Pharmacy Records  clopidogrel (PLAVIX) 75 MG tablet [id number] Yes TAKE 1 TABLET BY MOUTH DAILY [place] Devonne Doughty, DO Taking Active Pharmacy Records  dicyclomine (BENTYL) 20 MG tablet [id number] No Take 1 tablet (20 mg total) by mouth 4 (four) times daily -  before meals and at bedtime. As needed for abdominal pain cramping  Patient not taking: Reported on 12/09/2021   Olin Hauser, DO Not Taking Active Pharmacy Records  fluconazole (DIFLUCAN) 150 MG tablet [id number] Yes Take one tablet by mouth on Day 1. Repeat dose 2nd tablet on Day 3. [place], Devonne Doughty, DO  Active   fluticasone (FLONASE) 50 MCG/ACT nasal spray [id number] Yes USE 2 SPRAYS IN BOTH NOSTRILS  DAILY USE FOR 4 TO 6 WEEKS, THEN STOP AND USE SEASONALLY OR AS  NEEDED Olin Hauser, DO Taking Active Pharmacy Records  gabapentin (NEURONTIN) 600 MG tablet [id number] Yes TAKE 1 TABLET(600 MG) BY MOUTH FOUR TIMES DAILY Olin Hauser, DO Taking Active Pharmacy Records  IBU 600 MG tablet [id number] Yes Take 600 mg by mouth every 12 (twelve) hours as needed. [provider] Taking Active Pharmacy Records  metFORMIN (GLUCOPHAGE) 1000 MG tablet [id number] Yes TAKE 1 TABLET(1000 MG) BY MOUTH TWICE DAILY WITH A MEAL Olin Hauser, DO Taking Active Pharmacy Records  [facility] 5 MG/0.5ML Pen [id number] Yes INJECT '5MG'$  UNDER THE SKIN ONCE A WEEK Olin Hauser, DO Taking Active Pharmacy Records  pantoprazole (PROTONIX) 40 MG tablet [id number] Yes TAKE 1 TABLET BY MOUTH DAILY Olin Hauser, DO Taking Active Pharmacy Records  QUEtiapine (SEROQUEL) 100 MG tablet [id number] Yes TAKE 1 TABLET BY MOUTH AT  BEDTIME Olin Hauser, DO Taking  Active Pharmacy Records  sulfamethoxazole-trimethoprim (BACTRIM DS) 800-160 MG tablet [id number] Yes Take 1 tablet by mouth 2 (two) times daily for 7 days. Olin Hauser, DO  Active   topiramate (TOPAMAX) 100 MG tablet [id number] Yes TAKE 1 TABLET BY MOUTH TWICE  DAILY [place] Devonne Doughty, DO Taking Active Pharmacy Records  [facility] ELLIPTA 100-62.5-25 MCG/ACT AEPB [id number] Yes INHALE 1 PUFF INTO THE LUNGS DAILY [place] Devonne Doughty, DO Taking Active Pharmacy Records  Vitamin D, Ergocalciferol, (DRISDOL) 50000 units CAPS capsule [id number] Yes Take 50,000 Units by mouth every 7 (seven) days. On Wednesday [provider] Taking Active Pharmacy Records              Assessment/Plan:   Collaborate with PCP and [facility] LCSW via secure chat and telephone regarding patient Bactrim DS course prescribed 12/6 as patient reports that she did not take/is unable to locate this prescription - Provider reorders 7 day course of Bactrim DS for patient to [place] in [facility] today - LCSW speaks with patient's daughter in law who states that she will pick up the Rx for patient once it is ready at pharmacy   Diabetes: - Currently uncontrolled - Have  reviewed long term cardiovascular and renal outcomes of uncontrolled blood sugar - Have encouraged to have regular well-balanced meals throughout the day, while controlling carbohydrate portion sizes - Recommend to restart checking glucose, keep log of results and have this record to review at future appointment    Follow Up Plan: Clinical Pharmacist to follow up with patient by telephone again within the next 30 days  Wallace Cullens, PharmD, Para [place], [facility] [phone]

## 2021-12-30 NOTE — Patient Outreach (Signed)
  Care Coordination   Follow Up Visit Note   12/30/2021 Name: ANABEL LYKINS MRN: [id number] DOB: [birth year]  Gwenith Spitz is a 56 y.o. year old female who sees Olin Hauser, DO for primary care. I spoke with  Gwenith Spitz by phone today.  What matters to the patients health and wellness today?  [place] services, DME, Medications    Goals Addressed             This Visit's Progress    Care Coordination needs       Care Coordination Interventions: Follow up phone call to patient and patient's daughter in-law [name] regarding requested DME and [place] follow up Patient now residing in [facility] with her son and daughter in law Alvis [facility] will be following patient and will be at the home on Wednesday for initial intake Patient's daughter in law confirms that patient has a wheelchair, hospital bed, hoyer lift and bedside commode. Patient's daughter in law looking to purchase  a shower chair as well Patient needing another  prescription filled for Bactrim-per patient spouse did not pick prescription up- new prescritption requested by pharmacist to be re-ordered to the [place] on [address] in [facility] This social worker will continue to follow for [employer] needs         SDOH assessments and interventions completed:  No     Care Coordination Interventions:  Yes, provided   Follow up plan: Follow up call scheduled for 01/14/22    Encounter Outcome:  Pt. Visit Completed

## 2021-12-31 DIAGNOSIS — R531 Weakness: Secondary | ICD-10-CM | POA: Diagnosis not present

## 2021-12-31 DIAGNOSIS — Z8673 Personal history of transient ischemic attack (TIA), and cerebral infarction without residual deficits: Secondary | ICD-10-CM | POA: Diagnosis not present

## 2021-12-31 DIAGNOSIS — M549 Dorsalgia, unspecified: Secondary | ICD-10-CM | POA: Diagnosis not present

## 2021-12-31 DIAGNOSIS — R402412 Glasgow coma scale score 13-15, at arrival to emergency department: Secondary | ICD-10-CM | POA: Diagnosis not present

## 2021-12-31 DIAGNOSIS — N399 Disorder of urinary system, unspecified: Secondary | ICD-10-CM | POA: Diagnosis not present

## 2021-12-31 DIAGNOSIS — Z981 Arthrodesis status: Secondary | ICD-10-CM | POA: Diagnosis not present

## 2021-12-31 NOTE — Telephone Encounter (Signed)
I reviewed her chart. I would like to help her short term only one time order, but her medication list says she is on Suboxone. Buprenorphine HCl Nalxone film dissolvable. I cannot order this and cannot order pain med if she is also on Suboxone.  Can you please clarify her current pain medication that she has or the last one she has used?  Nobie Putnam, [facility] 12/31/2021, 12:43 PM

## 2021-12-31 NOTE — Telephone Encounter (Signed)
I attempted to call the patient.[name] after two rings I was sent to VM which said it was full.

## 2022-01-02 DIAGNOSIS — R531 Weakness: Secondary | ICD-10-CM | POA: Diagnosis not present

## 2022-01-02 DIAGNOSIS — K5900 Constipation, unspecified: Secondary | ICD-10-CM | POA: Diagnosis not present

## 2022-01-02 DIAGNOSIS — R1084 Generalized abdominal pain: Secondary | ICD-10-CM | POA: Diagnosis not present

## 2022-01-02 DIAGNOSIS — R52 Pain, unspecified: Secondary | ICD-10-CM | POA: Diagnosis not present

## 2022-01-04 DIAGNOSIS — T3695XD Adverse effect of unspecified systemic antibiotic, subsequent encounter: Secondary | ICD-10-CM | POA: Diagnosis not present

## 2022-01-04 DIAGNOSIS — Z7984 Long term (current) use of oral hypoglycemic drugs: Secondary | ICD-10-CM | POA: Diagnosis not present

## 2022-01-04 DIAGNOSIS — F32A Depression, unspecified: Secondary | ICD-10-CM | POA: Diagnosis not present

## 2022-01-04 DIAGNOSIS — Z9181 History of falling: Secondary | ICD-10-CM | POA: Diagnosis not present

## 2022-01-04 DIAGNOSIS — Z7985 Long-term (current) use of injectable non-insulin antidiabetic drugs: Secondary | ICD-10-CM | POA: Diagnosis not present

## 2022-01-04 DIAGNOSIS — L89521 Pressure ulcer of left ankle, stage 1: Secondary | ICD-10-CM | POA: Diagnosis not present

## 2022-01-04 DIAGNOSIS — N3001 Acute cystitis with hematuria: Secondary | ICD-10-CM | POA: Diagnosis not present

## 2022-01-04 DIAGNOSIS — J449 Chronic obstructive pulmonary disease, unspecified: Secondary | ICD-10-CM | POA: Diagnosis not present

## 2022-01-04 DIAGNOSIS — I69351 Hemiplegia and hemiparesis following cerebral infarction affecting right dominant side: Secondary | ICD-10-CM | POA: Diagnosis not present

## 2022-01-04 DIAGNOSIS — Z96659 Presence of unspecified artificial knee joint: Secondary | ICD-10-CM | POA: Diagnosis not present

## 2022-01-04 DIAGNOSIS — Z7951 Long term (current) use of inhaled steroids: Secondary | ICD-10-CM | POA: Diagnosis not present

## 2022-01-04 DIAGNOSIS — E119 Type 2 diabetes mellitus without complications: Secondary | ICD-10-CM | POA: Diagnosis not present

## 2022-01-04 DIAGNOSIS — J439 Emphysema, unspecified: Secondary | ICD-10-CM | POA: Diagnosis not present

## 2022-01-04 DIAGNOSIS — E7800 Pure hypercholesterolemia, unspecified: Secondary | ICD-10-CM | POA: Diagnosis not present

## 2022-01-04 DIAGNOSIS — B379 Candidiasis, unspecified: Secondary | ICD-10-CM | POA: Diagnosis not present

## 2022-01-04 DIAGNOSIS — Z7902 Long term (current) use of antithrombotics/antiplatelets: Secondary | ICD-10-CM | POA: Diagnosis not present

## 2022-01-04 DIAGNOSIS — I10 Essential (primary) hypertension: Secondary | ICD-10-CM | POA: Diagnosis not present

## 2022-01-04 DIAGNOSIS — F17210 Nicotine dependence, cigarettes, uncomplicated: Secondary | ICD-10-CM | POA: Diagnosis not present

## 2022-01-07 ENCOUNTER — Other Ambulatory Visit: Payer: Self-pay | Admitting: Family Medicine

## 2022-01-07 DIAGNOSIS — J432 Centrilobular emphysema: Secondary | ICD-10-CM

## 2022-01-08 DIAGNOSIS — F32A Depression, unspecified: Secondary | ICD-10-CM | POA: Diagnosis not present

## 2022-01-08 DIAGNOSIS — B379 Candidiasis, unspecified: Secondary | ICD-10-CM | POA: Diagnosis not present

## 2022-01-08 DIAGNOSIS — Z7984 Long term (current) use of oral hypoglycemic drugs: Secondary | ICD-10-CM | POA: Diagnosis not present

## 2022-01-08 DIAGNOSIS — I69351 Hemiplegia and hemiparesis following cerebral infarction affecting right dominant side: Secondary | ICD-10-CM | POA: Diagnosis not present

## 2022-01-08 DIAGNOSIS — Z7951 Long term (current) use of inhaled steroids: Secondary | ICD-10-CM | POA: Diagnosis not present

## 2022-01-08 DIAGNOSIS — N3001 Acute cystitis with hematuria: Secondary | ICD-10-CM | POA: Diagnosis not present

## 2022-01-08 DIAGNOSIS — Z96659 Presence of unspecified artificial knee joint: Secondary | ICD-10-CM | POA: Diagnosis not present

## 2022-01-08 DIAGNOSIS — J439 Emphysema, unspecified: Secondary | ICD-10-CM | POA: Diagnosis not present

## 2022-01-08 DIAGNOSIS — T3695XD Adverse effect of unspecified systemic antibiotic, subsequent encounter: Secondary | ICD-10-CM | POA: Diagnosis not present

## 2022-01-08 DIAGNOSIS — E7800 Pure hypercholesterolemia, unspecified: Secondary | ICD-10-CM | POA: Diagnosis not present

## 2022-01-08 DIAGNOSIS — L89521 Pressure ulcer of left ankle, stage 1: Secondary | ICD-10-CM | POA: Diagnosis not present

## 2022-01-08 DIAGNOSIS — J449 Chronic obstructive pulmonary disease, unspecified: Secondary | ICD-10-CM | POA: Diagnosis not present

## 2022-01-08 DIAGNOSIS — Z7902 Long term (current) use of antithrombotics/antiplatelets: Secondary | ICD-10-CM | POA: Diagnosis not present

## 2022-01-08 DIAGNOSIS — Z9181 History of falling: Secondary | ICD-10-CM | POA: Diagnosis not present

## 2022-01-08 DIAGNOSIS — I10 Essential (primary) hypertension: Secondary | ICD-10-CM | POA: Diagnosis not present

## 2022-01-08 DIAGNOSIS — E119 Type 2 diabetes mellitus without complications: Secondary | ICD-10-CM | POA: Diagnosis not present

## 2022-01-08 DIAGNOSIS — Z7985 Long-term (current) use of injectable non-insulin antidiabetic drugs: Secondary | ICD-10-CM | POA: Diagnosis not present

## 2022-01-08 DIAGNOSIS — F17210 Nicotine dependence, cigarettes, uncomplicated: Secondary | ICD-10-CM | POA: Diagnosis not present

## 2022-01-08 NOTE — Telephone Encounter (Signed)
Unable to refill per protocol, request is too soon. Last refill 06/25/21 for 60 and 11 refills. Will refuse.  Requested Prescriptions  Pending Prescriptions Disp Refills   TRELEGY ELLIPTA 100-62.5-25 MCG/ACT AEPB [Pharmacy Med Name: Trelegy Ellipta 100-62.5-25 MCG/INH Inhalation Aerosol Powder Breath Activated] 180 each 3    Sig: USE 1 INHALATION BY MOUTH ONCE  DAILY AT THE SAME TIME EACH DAY     Off-Protocol Failed - 01/07/2022 10:12 PM      Failed - Medication not assigned to a protocol, review manually.      Passed - Valid encounter within last 12 months    Recent Outpatient Visits           1 week ago Type 2 diabetes mellitus with other specified complication, without long-term current use of insulin ([place])   [facility] [place], Grayland Ormond A, RPH-CPP   3 weeks ago History of cerebrovascular accident (CVA) with residual deficit   [place], DO   1 month ago Oral thrush   [facility] [place], [place] W, NP   1 month ago Type 2 diabetes mellitus with other specified complication, without long-term current use of insulin ([place])   [place], Grayland Ormond A, RPH-CPP   2 months ago Type 2 diabetes mellitus with other specified complication, without long-term current use of insulin ([place])   [place], RPH-CPP

## 2022-01-09 DIAGNOSIS — Z7951 Long term (current) use of inhaled steroids: Secondary | ICD-10-CM | POA: Diagnosis not present

## 2022-01-09 DIAGNOSIS — B379 Candidiasis, unspecified: Secondary | ICD-10-CM | POA: Diagnosis not present

## 2022-01-09 DIAGNOSIS — J449 Chronic obstructive pulmonary disease, unspecified: Secondary | ICD-10-CM | POA: Diagnosis not present

## 2022-01-09 DIAGNOSIS — N3001 Acute cystitis with hematuria: Secondary | ICD-10-CM | POA: Diagnosis not present

## 2022-01-09 DIAGNOSIS — Z7902 Long term (current) use of antithrombotics/antiplatelets: Secondary | ICD-10-CM | POA: Diagnosis not present

## 2022-01-09 DIAGNOSIS — L89521 Pressure ulcer of left ankle, stage 1: Secondary | ICD-10-CM | POA: Diagnosis not present

## 2022-01-09 DIAGNOSIS — E119 Type 2 diabetes mellitus without complications: Secondary | ICD-10-CM | POA: Diagnosis not present

## 2022-01-09 DIAGNOSIS — F32A Depression, unspecified: Secondary | ICD-10-CM | POA: Diagnosis not present

## 2022-01-09 DIAGNOSIS — Z7985 Long-term (current) use of injectable non-insulin antidiabetic drugs: Secondary | ICD-10-CM | POA: Diagnosis not present

## 2022-01-09 DIAGNOSIS — Z7984 Long term (current) use of oral hypoglycemic drugs: Secondary | ICD-10-CM | POA: Diagnosis not present

## 2022-01-09 DIAGNOSIS — Z9181 History of falling: Secondary | ICD-10-CM | POA: Diagnosis not present

## 2022-01-09 DIAGNOSIS — I10 Essential (primary) hypertension: Secondary | ICD-10-CM | POA: Diagnosis not present

## 2022-01-09 DIAGNOSIS — E7800 Pure hypercholesterolemia, unspecified: Secondary | ICD-10-CM | POA: Diagnosis not present

## 2022-01-09 DIAGNOSIS — T3695XD Adverse effect of unspecified systemic antibiotic, subsequent encounter: Secondary | ICD-10-CM | POA: Diagnosis not present

## 2022-01-09 DIAGNOSIS — I69351 Hemiplegia and hemiparesis following cerebral infarction affecting right dominant side: Secondary | ICD-10-CM | POA: Diagnosis not present

## 2022-01-09 DIAGNOSIS — F17210 Nicotine dependence, cigarettes, uncomplicated: Secondary | ICD-10-CM | POA: Diagnosis not present

## 2022-01-09 DIAGNOSIS — Z96659 Presence of unspecified artificial knee joint: Secondary | ICD-10-CM | POA: Diagnosis not present

## 2022-01-09 DIAGNOSIS — J439 Emphysema, unspecified: Secondary | ICD-10-CM | POA: Diagnosis not present

## 2022-01-10 ENCOUNTER — Encounter: Payer: Self-pay | Admitting: Family Medicine

## 2022-01-10 ENCOUNTER — Ambulatory Visit (INDEPENDENT_AMBULATORY_CARE_PROVIDER_SITE_OTHER): Payer: Medicare Other | Admitting: Family Medicine

## 2022-01-10 DIAGNOSIS — I10 Essential (primary) hypertension: Secondary | ICD-10-CM | POA: Diagnosis not present

## 2022-01-10 DIAGNOSIS — J439 Emphysema, unspecified: Secondary | ICD-10-CM | POA: Diagnosis not present

## 2022-01-10 DIAGNOSIS — E119 Type 2 diabetes mellitus without complications: Secondary | ICD-10-CM | POA: Diagnosis not present

## 2022-01-10 DIAGNOSIS — J449 Chronic obstructive pulmonary disease, unspecified: Secondary | ICD-10-CM | POA: Diagnosis not present

## 2022-01-10 DIAGNOSIS — N3001 Acute cystitis with hematuria: Secondary | ICD-10-CM | POA: Diagnosis not present

## 2022-01-10 DIAGNOSIS — J432 Centrilobular emphysema: Secondary | ICD-10-CM | POA: Diagnosis not present

## 2022-01-10 DIAGNOSIS — E7800 Pure hypercholesterolemia, unspecified: Secondary | ICD-10-CM | POA: Diagnosis not present

## 2022-01-10 DIAGNOSIS — I69951 Hemiplegia and hemiparesis following unspecified cerebrovascular disease affecting right dominant side: Secondary | ICD-10-CM | POA: Diagnosis not present

## 2022-01-10 DIAGNOSIS — Z9181 History of falling: Secondary | ICD-10-CM | POA: Diagnosis not present

## 2022-01-10 DIAGNOSIS — T3695XD Adverse effect of unspecified systemic antibiotic, subsequent encounter: Secondary | ICD-10-CM | POA: Diagnosis not present

## 2022-01-10 DIAGNOSIS — Z96659 Presence of unspecified artificial knee joint: Secondary | ICD-10-CM | POA: Diagnosis not present

## 2022-01-10 DIAGNOSIS — G894 Chronic pain syndrome: Secondary | ICD-10-CM

## 2022-01-10 DIAGNOSIS — Z7984 Long term (current) use of oral hypoglycemic drugs: Secondary | ICD-10-CM | POA: Diagnosis not present

## 2022-01-10 DIAGNOSIS — L89521 Pressure ulcer of left ankle, stage 1: Secondary | ICD-10-CM | POA: Diagnosis not present

## 2022-01-10 DIAGNOSIS — F32A Depression, unspecified: Secondary | ICD-10-CM | POA: Diagnosis not present

## 2022-01-10 DIAGNOSIS — F17210 Nicotine dependence, cigarettes, uncomplicated: Secondary | ICD-10-CM

## 2022-01-10 DIAGNOSIS — Z7902 Long term (current) use of antithrombotics/antiplatelets: Secondary | ICD-10-CM | POA: Diagnosis not present

## 2022-01-10 DIAGNOSIS — Z7985 Long-term (current) use of injectable non-insulin antidiabetic drugs: Secondary | ICD-10-CM | POA: Diagnosis not present

## 2022-01-10 DIAGNOSIS — Z7951 Long term (current) use of inhaled steroids: Secondary | ICD-10-CM | POA: Diagnosis not present

## 2022-01-10 DIAGNOSIS — B379 Candidiasis, unspecified: Secondary | ICD-10-CM | POA: Diagnosis not present

## 2022-01-10 DIAGNOSIS — I69351 Hemiplegia and hemiparesis following cerebral infarction affecting right dominant side: Secondary | ICD-10-CM | POA: Diagnosis not present

## 2022-01-10 MED ORDER — SULFAMETHOXAZOLE-TRIMETHOPRIM 800-160 MG PO TABS: 1.0000 | ORAL_TABLET | Freq: Two times a day (BID) | ORAL | 0 refills | Status: DC

## 2022-01-10 MED ORDER — OXYCODONE-ACETAMINOPHEN 10-325 MG PO TABS: 1.0000 | ORAL_TABLET | Freq: Three times a day (TID) | ORAL | 0 refills | Status: DC | PRN

## 2022-01-10 NOTE — Progress Notes (Signed)
Virtual Visit via Telephone The purpose of this virtual visit is to provide medical care while limiting exposure to the novel coronavirus (COVID19) for both patient and office staff.  Consent was obtained for phone visit:  Yes.   Answered questions that patient had about telehealth interaction:  Yes.   I discussed the limitations, risks, security and privacy concerns of performing an evaluation and management service by telephone. I also discussed with the patient that there may be a patient responsible charge related to this service. The patient expressed understanding and agreed to proceed.  Patient Location: Home Provider Location: [facility] (Office)  Participants in virtual visit: - Patient: [name] - CMA: Orinda Kenner, [place] - Provider: Dr Parks Ranger  ---------------------------------------------------------------------- Chief Complaint  Patient presents with   Back Pain   Recurrent UTI    S: Reviewed CMA documentation. I have called patient and gathered additional HPI as follows:  She has relocated to [facility] to live with son and daughter in law short term for rehab.  She has [facility] and P.T OT and will need orders for [place]  Since arrived in [facility], already been to ED visit x 2 12/31/21 and 01/02/22 for UTI concerns, urinary incontinence and diarrhea.  Urinary Incontinence Placed on bladder medicine from ED, oxybutynin  Dysuria, not resolved, needs repeat Bactrim for UTI. It improved but did not resolve. Last culture was negative.  Chronic Pain On medication opiates for years, she was on suboxone but has come off in past 1 week. She is on full schedule for P.T and OT, goal is to get out of wheelchair. She came off of it for past 1 week. Was on oxycodone previously, will need management of pain to pursue her Physical Therapy  [facility] [place], [place]  [address]   History of Stroke CVA 2019 Residual R sided dominant hemiparesis deficit Generalized Weakness Mostly wheelchair bound needing physical therapy to progress to improve mobility  Denies any fevers, chills, sweats, body ache, cough, shortness of breath, sinus pain or pressure, headache, abdominal pain, diarrhea  Past Medical History:  Diagnosis Date   Anxiety    Cancer ([place])    a spot on liver and treated    Complication of anesthesia    restless,easily upset   COPD (chronic obstructive pulmonary disease) ([place])    Depression    Diabetes mellitus without complication (HCC)    Diverticulitis    Fatty liver    GERD (gastroesophageal reflux disease)    Headache(784.0)    migraines   History of kidney stones    Hyperlipidemia    Hypertension    Pneumonia    Restless    Stroke ([place])    Social History   Tobacco Use   Smoking status: Every Day    Packs/day: 1.00    Years: 20.00    Total pack years: 20.00    Types: Cigarettes   Smokeless tobacco: Never  Vaping Use   Vaping Use: Former  Substance Use Topics   Alcohol use: No   Drug use: Yes    Types: Marijuana    Comment: last smoked 2 days ago  8/4    Current Outpatient Medications:    methocarbamol (ROBAXIN) 500 MG tablet, Take 500 mg by mouth 3 (three) times daily., Disp: , Rfl:    oxyCODONE-acetaminophen (PERCOCET) 10-325 MG tablet, Take 1 tablet by mouth every 8 (eight) hours as needed for  pain., Disp: 60 tablet, Rfl: 0   acetic acid 2 % otic solution, Place 4 drops into the left ear 4 (four) times daily., Disp: 15 mL, Rfl: 0   albuterol (VENTOLIN HFA) 108 (90 Base) MCG/ACT inhaler, USE 2 INHALATIONS BY MOUTH EVERY 4 HOURS AS NEEDED FOR WHEEZING  OR SHORTNESS OF BREATH, Disp: 26.8 g, Rfl: 4   atorvastatin (LIPITOR) 40 MG tablet, TAKE 1 TABLET BY MOUTH AT  BEDTIME, Disp: 90 tablet, Rfl: 0   azelastine (ASTELIN) 0.1 % nasal spray, Place 2 sprays into both nostrils 2 (two) times daily., Disp: 30 mL, Rfl: 12    buPROPion (WELLBUTRIN XL) 150 MG 24 hr tablet, TAKE 1 TABLET BY MOUTH DAILY, Disp: 100 tablet, Rfl: 3   citalopram (CELEXA) 40 MG tablet, TAKE 1 TABLET BY MOUTH DAILY, Disp: 100 tablet, Rfl: 3   clopidogrel (PLAVIX) 75 MG tablet, TAKE 1 TABLET BY MOUTH DAILY, Disp: 100 tablet, Rfl: 2   dicyclomine (BENTYL) 20 MG tablet, Take 1 tablet (20 mg total) by mouth 4 (four) times daily -  before meals and at bedtime. As needed for abdominal pain cramping (Patient not taking: Reported on 12/09/2021), Disp: 60 tablet, Rfl: 2   fluconazole (DIFLUCAN) 150 MG tablet, Take one tablet by mouth on Day 1. Repeat dose 2nd tablet on Day 3., Disp: 2 tablet, Rfl: 0   fluticasone (FLONASE) 50 MCG/ACT nasal spray, USE 2 SPRAYS IN BOTH NOSTRILS  DAILY USE FOR 4 TO 6 WEEKS, THEN STOP AND USE SEASONALLY OR AS  NEEDED, Disp: 48 g, Rfl: 0   gabapentin (NEURONTIN) 600 MG tablet, TAKE 1 TABLET(600 MG) BY MOUTH FOUR TIMES DAILY, Disp: 360 tablet, Rfl: 3   IBU 600 MG tablet, Take 600 mg by mouth every 12 (twelve) hours as needed., Disp: , Rfl:    metFORMIN (GLUCOPHAGE) 1000 MG tablet, TAKE 1 TABLET(1000 MG) BY MOUTH TWICE DAILY WITH A MEAL, Disp: 180 tablet, Rfl: 1   MOUNJARO 5 MG/0.5ML Pen, INJECT '5MG'$  UNDER THE SKIN ONCE A WEEK, Disp: 2 mL, Rfl: 2   oxybutynin (DITROPAN-XL) 5 MG 24 hr tablet, Take 5 mg by mouth daily., Disp: , Rfl:    pantoprazole (PROTONIX) 40 MG tablet, TAKE 1 TABLET BY MOUTH DAILY, Disp: 100 tablet, Rfl: 3   QUEtiapine (SEROQUEL) 100 MG tablet, TAKE 1 TABLET BY MOUTH AT  BEDTIME, Disp: 100 tablet, Rfl: 3   sulfamethoxazole-trimethoprim (BACTRIM DS) 800-160 MG tablet, Take 1 tablet by mouth 2 (two) times daily., Disp: 14 tablet, Rfl: 0   topiramate (TOPAMAX) 100 MG tablet, TAKE 1 TABLET BY MOUTH TWICE  DAILY, Disp: 200 tablet, Rfl: 3   TRELEGY ELLIPTA 100-62.5-25 MCG/ACT AEPB, INHALE 1 PUFF INTO THE LUNGS DAILY, Disp: 60 each, Rfl: 11   Vitamin D, Ergocalciferol, (DRISDOL) 50000 units CAPS capsule, Take 50,000  Units by mouth every 7 (seven) days. On Wednesday, Disp: , Rfl:      05/15/2021    1:59 PM 04/17/2021    2:35 PM 03/04/2021    2:30 PM  Depression screen PHQ 2/9  Decreased Interest '2 2 1  '$ Down, Depressed, Hopeless 0 0 1  PHQ - 2 Score '2 2 2  '$ Altered sleeping '3 3 2  '$ Tired, decreased energy '3 3 3  '$ Change in appetite '3 3 2  '$ Feeling bad or failure about yourself  0 0 0  Trouble concentrating '2 2 1  '$ Moving slowly or fidgety/restless 2 2 0  Suicidal thoughts 0 0 0  PHQ-9 Score 15  15 10  Difficult doing work/chores Very difficult Very difficult Not difficult at all       05/15/2021    1:59 PM 04/17/2021    2:35 PM 01/18/2021    1:11 PM 08/06/2020    2:49 PM  GAD 7 : Generalized Anxiety Score  Nervous, Anxious, on Edge 2 2 0 1  Control/stop worrying 0 0 0 1  Worry too much - different things 2 1 0 1  Trouble relaxing '3 3 1 1  '$ Restless 3 3  0  Easily annoyed or irritable '3 3 1 '$ 0  Afraid - awful might happen 0 0 0 0  Total GAD 7 Score '13 12  4  '$ Anxiety Difficulty Somewhat difficult Not difficult at all Not difficult at all Not difficult at all    -------------------------------------------------------------------------- O: No physical exam performed due to remote telephone encounter.  Lab results reviewed.  Recent Results (from the past 2160 hour(s))  Blood gas, venous     Status: Abnormal   Collection Time: 12/08/21  7:19 PM  Result Value Ref Range   pH, Ven 7.43 7.25 - 7.43   pCO2, Ven 37 (L) 44 - 60 mmHg   pO2, Ven 37 32 - 45 mmHg   Bicarbonate 24.6 20.0 - 28.0 mmol/L   Acid-Base Excess 0.5 0.0 - 2.0 mmol/L   O2 Saturation 59.6 %   Patient temperature 37.0    Collection site VEIN     Comment: Performed at [facility], [address], [place], [place] [address]  Resp Panel by RT-PCR (Flu A&B, Covid) Anterior Nasal Swab     Status: None   Collection Time: 12/08/21  7:20 PM   Specimen: Anterior Nasal Swab  Result Value Ref Range   SARS Coronavirus 2 by RT PCR  NEGATIVE NEGATIVE    Comment: (NOTE) SARS-CoV-2 target nucleic acids are NOT DETECTED.  The SARS-CoV-2 RNA is generally detectable in upper respiratory specimens during the acute phase of infection. The lowest concentration of SARS-CoV-2 viral copies this assay can detect is 138 copies/mL. A negative result does not preclude SARS-Cov-2 infection and should not be used as the sole basis for treatment or other patient management decisions. A negative result may occur with  improper specimen collection/handling, submission of specimen other than nasopharyngeal swab, presence of viral mutation(s) within the areas targeted by this assay, and inadequate number of viral copies(<138 copies/mL). A negative result must be combined with clinical observations, patient history, and epidemiological information. The expected result is Negative.  Fact Sheet for Patients:  EntrepreneurPulse.com.au  Fact Sheet for Healthcare Providers:  IncredibleEmployment.be  This test is no t yet approved or cleared by the [place] FDA and  has been authorized for detection and/or diagnosis of SARS-CoV-2 by FDA under an Emergency Use Authorization (EUA). This EUA will remain  in effect (meaning this test can be used) for the duration of the COVID-19 declaration under Section 564(b)(1) of the Act, 21 U.S.C.section 360bbb-3(b)(1), unless the authorization is terminated  or revoked sooner.       Influenza A by PCR NEGATIVE NEGATIVE   Influenza B by PCR NEGATIVE NEGATIVE    Comment: (NOTE) The Xpert Xpress SARS-CoV-2/FLU/RSV plus assay is intended as an aid in the diagnosis of influenza from Nasopharyngeal swab specimens and should not be used as a sole basis for treatment. Nasal washings and aspirates are unacceptable for Xpert Xpress SARS-CoV-2/FLU/RSV testing.  Fact Sheet for Patients: EntrepreneurPulse.com.au  Fact Sheet for Healthcare  Providers: IncredibleEmployment.be  This test is not yet approved or cleared by the [place] and has been authorized for detection and/or diagnosis of SARS-CoV-2 by FDA under an Emergency Use Authorization (EUA). This EUA will remain in effect (meaning this test can be used) for the duration of the COVID-19 declaration under Section 564(b)(1) of the Act, 21 U.S.C. section 360bbb-3(b)(1), unless the authorization is terminated or revoked.  Performed at [facility], [address] [place], [place] [address]   Comprehensive metabolic panel     Status: Abnormal   Collection Time: 12/08/21  7:44 PM  Result Value Ref Range   Sodium 133 (L) 135 - 145 mmol/L   Potassium 3.7 3.5 - 5.1 mmol/L   Chloride 100 98 - 111 mmol/L   CO2 17 (L) 22 - 32 mmol/L   Glucose, Bld 150 (H) 70 - 99 mg/dL    Comment: Glucose reference range applies only to samples taken after fasting for at least 8 hours.   BUN 19 6 - 20 mg/dL   Creatinine, Ser 0.74 0.44 - 1.00 mg/dL   Calcium 9.3 8.9 - 10.3 mg/dL   Total Protein 8.0 6.5 - 8.1 g/dL   Albumin 3.8 3.5 - 5.0 g/dL   AST 20 15 - 41 U/L   ALT 12 0 - 44 U/L   Alkaline Phosphatase 83 38 - 126 U/L   Total Bilirubin 1.1 0.3 - 1.2 mg/dL   GFR, Estimated >60 >60 mL/min    Comment: (NOTE) Calculated using the CKD-EPI Creatinine Equation (2021)    Anion gap 16 (H) 5 - 15    Comment: Performed at [facility], [address] [place], [place] [address]  CBC with Differential     Status: Abnormal   Collection Time: 12/08/21  7:44 PM  Result Value Ref Range   WBC 12.7 (H) 4.0 - 10.5 K/uL   RBC 5.69 (H) 3.87 - 5.11 MIL/uL   Hemoglobin 15.1 (H) 12.0 - 15.0 g/dL   HCT 46.3 (H) 36.0 - 46.0 %   MCV 81.4 80.0 - 100.0 fL   MCH 26.5 26.0 - 34.0 pg   MCHC 32.6 30.0 - 36.0 g/dL   RDW 13.9 11.5 - 15.5 %   Platelets 383 150 - 400 K/uL   nRBC 0.0 0.0 - 0.2 %   Neutrophils Relative % 77 %   Neutro Abs 9.8 (H) 1.7 - 7.7 K/uL    Lymphocytes Relative 17 %   Lymphs Abs 2.2 0.7 - 4.0 K/uL   Monocytes Relative 5 %   Monocytes Absolute 0.6 0.1 - 1.0 K/uL   Eosinophils Relative 0 %   Eosinophils Absolute 0.0 0.0 - 0.5 K/uL   Basophils Relative 0 %   Basophils Absolute 0.0 0.0 - 0.1 K/uL   Immature Granulocytes 1 %   Abs Immature Granulocytes 0.07 0.00 - 0.07 K/uL    Comment: Performed at [facility], [address] [place], [place] [address]  Blood Culture (routine x 2)     Status: None   Collection Time: 12/08/21  7:44 PM   Specimen: BLOOD LEFT ARM  Result Value Ref Range   Specimen Description BLOOD LEFT ARM    Special Requests      BOTTLES DRAWN AEROBIC AND ANAEROBIC Blood Culture results may not be optimal due to an inadequate volume of blood received in culture bottles   Culture      NO GROWTH 5 DAYS Performed at [facility], [address], [place], [place] [address]  Report Status 12/13/2021 FINAL   Blood Culture (routine x 2)     Status: None   Collection Time: 12/08/21  7:44 PM   Specimen: BLOOD RIGHT ARM  Result Value Ref Range   Specimen Description BLOOD RIGHT ARM    Special Requests      BOTTLES DRAWN AEROBIC AND ANAEROBIC Blood Culture results may not be optimal due to an inadequate volume of blood received in culture bottles   Culture      NO GROWTH 5 DAYS Performed at [facility], [address] [place], [place] [address]    Report Status 12/13/2021 FINAL   Procalcitonin - Baseline     Status: None   Collection Time: 12/08/21  7:44 PM  Result Value Ref Range   Procalcitonin <0.10 ng/mL    Comment:        Interpretation: PCT (Procalcitonin) <= 0.5 ng/mL: Systemic infection (sepsis) is not likely. Local bacterial infection is possible. (NOTE)       Sepsis PCT Algorithm           Lower Respiratory Tract                                      Infection PCT Algorithm    ----------------------------     ----------------------------         PCT < 0.25  ng/mL                PCT < 0.10 ng/mL          Strongly encourage             Strongly discourage   discontinuation of antibiotics    initiation of antibiotics    ----------------------------     -----------------------------       PCT 0.25 - 0.50 ng/mL            PCT 0.10 - 0.25 ng/mL               OR       >80% decrease in PCT            Discourage initiation of                                            antibiotics      Encourage discontinuation           of antibiotics    ----------------------------     -----------------------------         PCT >= 0.50 ng/mL              PCT 0.26 - 0.50 ng/mL               AND        <80% decrease in PCT             Encourage initiation of                                             antibiotics       Encourage continuation           of antibiotics    ----------------------------     -----------------------------  PCT >= 0.50 ng/mL                  PCT > 0.50 ng/mL               AND         increase in PCT                  Strongly encourage                                      initiation of antibiotics    Strongly encourage escalation           of antibiotics                                     -----------------------------                                           PCT <= 0.25 ng/mL                                                 OR                                        > 80% decrease in PCT                                      Discontinue / Do not initiate                                             antibiotics  Performed at [facility], [address] [place], [place] [address]   TSH     Status: None   Collection Time: 12/08/21  7:44 PM  Result Value Ref Range   TSH 0.921 0.350 - 4.500 uIU/mL    Comment: Performed by a 3rd Generation assay with a functional sensitivity of <=0.01 uIU/mL. Performed at [facility], [address] [place], [place] [address]   Lactic acid, plasma     Status: Abnormal    Collection Time: 12/08/21  9:19 PM  Result Value Ref Range   Lactic Acid, Venous 2.1 (HH) 0.5 - 1.9 mmol/L    Comment: CRITICAL RESULT CALLED TO, READ BACK BY AND VERIFIED WITH RACHEL MERKLE AT 2127 12/08/2021 DLB Performed at [facility], [address] [place], [place] [address]   Urinalysis, Complete w Microscopic     Status: Abnormal   Collection Time: 12/08/21 11:43 PM  Result Value Ref Range   Color, Urine YELLOW (A) YELLOW   APPearance HAZY (A) CLEAR   Specific Gravity, Urine >1.046 (H) 1.005 - 1.030   pH 5.0 5.0 - 8.0   Glucose, UA NEGATIVE NEGATIVE mg/dL   Hgb urine dipstick NEGATIVE NEGATIVE  Bilirubin Urine NEGATIVE NEGATIVE   Ketones, ur 80 (A) NEGATIVE mg/dL   Protein, ur 30 (A) NEGATIVE mg/dL   Nitrite NEGATIVE NEGATIVE   Leukocytes,Ua TRACE (A) NEGATIVE   RBC / HPF 6-10 0 - 5 RBC/hpf   WBC, UA 0-5 0 - 5 WBC/hpf   Bacteria, UA RARE (A) NONE SEEN   Squamous Epithelial / LPF 6-10 0 - 5   Mucus PRESENT     Comment: Performed at [facility], [address], [place], [place] [address]  Urine Culture     Status: Abnormal   Collection Time: 12/08/21 11:43 PM   Specimen: In/Out Cath Urine  Result Value Ref Range   Specimen Description      IN/OUT CATH URINE Performed at [facility], [address] [place], [place] [address]    Special Requests      NONE Performed at [facility], [address] [place], [place] [address]    Culture 20,000 COLONIES/mL ESCHERICHIA COLI (A)    Report Status 12/11/2021 FINAL    Organism ID, Bacteria ESCHERICHIA COLI (A)       Susceptibility   Escherichia coli - MIC*    AMPICILLIN >=32 RESISTANT Resistant     CEFAZOLIN <=4 SENSITIVE Sensitive     CEFEPIME <=0.12 SENSITIVE Sensitive     CEFTRIAXONE <=0.25 SENSITIVE Sensitive     CIPROFLOXACIN >=4 RESISTANT Resistant     GENTAMICIN <=1 SENSITIVE Sensitive     IMIPENEM <=0.25 SENSITIVE Sensitive     NITROFURANTOIN <=16 SENSITIVE Sensitive      TRIMETH/SULFA <=20 SENSITIVE Sensitive     AMPICILLIN/SULBACTAM 16 INTERMEDIATE Intermediate     PIP/TAZO <=4 SENSITIVE Sensitive     * 20,000 COLONIES/mL ESCHERICHIA COLI  Lactic acid, plasma     Status: None   Collection Time: 12/08/21 11:43 PM  Result Value Ref Range   Lactic Acid, Venous 1.6 0.5 - 1.9 mmol/L    Comment: Performed at [facility], [address], [place], [place] [address]  Urine Drug Screen, Qualitative ([facility] only)     Status: Abnormal   Collection Time: 12/08/21 11:58 PM  Result Value Ref Range   Tricyclic, Ur Screen POSITIVE (A) NONE DETECTED   Amphetamines, Ur Screen NONE DETECTED NONE DETECTED   MDMA (Ecstasy)Ur Screen NONE DETECTED NONE DETECTED   Cocaine Metabolite,Ur [place] NONE DETECTED NONE DETECTED   Opiate, Ur Screen NONE DETECTED NONE DETECTED   Phencyclidine (PCP) Ur S NONE DETECTED NONE DETECTED   Cannabinoid 50 Ng, Ur [place] POSITIVE (A) NONE DETECTED   Barbiturates, Ur Screen NONE DETECTED NONE DETECTED   Benzodiazepine, Ur Scrn NONE DETECTED NONE DETECTED   Methadone Scn, Ur NONE DETECTED NONE DETECTED    Comment: (NOTE) Tricyclics + metabolites, urine    Cutoff 1000 ng/mL Amphetamines + metabolites, urine  Cutoff 1000 ng/mL MDMA (Ecstasy), urine              Cutoff 500 ng/mL Cocaine Metabolite, urine          Cutoff 300 ng/mL Opiate + metabolites, urine        Cutoff 300 ng/mL Phencyclidine (PCP), urine         Cutoff 25 ng/mL Cannabinoid, urine                 Cutoff 50 ng/mL Barbiturates + metabolites, urine  Cutoff 200 ng/mL Benzodiazepine, urine              Cutoff 200 ng/mL Methadone, urine  Cutoff 300 ng/mL  The urine drug screen provides only a preliminary, unconfirmed analytical test result and should not be used for non-medical purposes. Clinical consideration and professional judgment should be applied to any positive drug screen result due to possible interfering substances. A more specific alternate  chemical method must be used in order to obtain a confirmed analytical result. Gas chromatography / mass spectrometry (GC/MS) is the preferred confirm atory method. Performed at [facility], [address] [place], [place] [address]   APTT     Status: None   Collection Time: 12/09/21  4:30 AM  Result Value Ref Range   aPTT 30 24 - 36 seconds    Comment: Performed at [facility], [address] [place], [place] [address]  Protime-INR     Status: Abnormal   Collection Time: 12/09/21  4:30 AM  Result Value Ref Range   Prothrombin Time 16.2 (H) 11.4 - 15.2 seconds   INR 1.3 (H) 0.8 - 1.2    Comment: (NOTE) INR goal varies based on device and disease states. Performed at [facility], [address] [place], [place] [address]   Comprehensive metabolic panel     Status: Abnormal   Collection Time: 12/09/21  4:30 AM  Result Value Ref Range   Sodium 134 (L) 135 - 145 mmol/L   Potassium 3.2 (L) 3.5 - 5.1 mmol/L   Chloride 101 98 - 111 mmol/L   CO2 20 (L) 22 - 32 mmol/L   Glucose, Bld 152 (H) 70 - 99 mg/dL    Comment: Glucose reference range applies only to samples taken after fasting for at least 8 hours.   BUN 18 6 - 20 mg/dL   Creatinine, Ser 0.70 0.44 - 1.00 mg/dL   Calcium 8.9 8.9 - 10.3 mg/dL   Total Protein 6.9 6.5 - 8.1 g/dL   Albumin 3.3 (L) 3.5 - 5.0 g/dL   AST 15 15 - 41 U/L   ALT 9 0 - 44 U/L   Alkaline Phosphatase 70 38 - 126 U/L   Total Bilirubin 0.9 0.3 - 1.2 mg/dL   GFR, Estimated >60 >60 mL/min    Comment: (NOTE) Calculated using the CKD-EPI Creatinine Equation (2021)    Anion gap 13 5 - 15    Comment: Performed at [facility], [address] [place], [place] [address]  CBC     Status: Abnormal   Collection Time: 12/09/21  4:30 AM  Result Value Ref Range   WBC 14.0 (H) 4.0 - 10.5 K/uL   RBC 5.09 3.87 - 5.11 MIL/uL   Hemoglobin 14.0 12.0 - 15.0 g/dL   HCT 41.4 36.0 - 46.0 %   MCV 81.3 80.0 - 100.0 fL   MCH 27.5  26.0 - 34.0 pg   MCHC 33.8 30.0 - 36.0 g/dL   RDW 13.9 11.5 - 15.5 %   Platelets 396 150 - 400 K/uL   nRBC 0.0 0.0 - 0.2 %    Comment: Performed at [facility], [address] [place],  [address]  Hemoglobin A1c     Status: Abnormal   Collection Time: 12/09/21  4:30 AM  Result Value Ref Range   Hgb A1c MFr Bld 6.7 (H) 4.8 - 5.6 %    Comment: (NOTE)         Prediabetes: 5.7 - 6.4         Diabetes: >6.4         Glycemic control for adults with diabetes: <7.0  Mean Plasma Glucose 146 mg/dL    Comment: (NOTE) Performed At: [facility] [place], [place] [id number] Rush Farmer MD ME:[id number]   CBG monitoring, ED     Status: Abnormal   Collection Time: 12/09/21  5:15 AM  Result Value Ref Range   Glucose-Capillary 163 (H) 70 - 99 mg/dL    Comment: Glucose reference range applies only to samples taken after fasting for at least 8 hours.  CBG monitoring, ED     Status: Abnormal   Collection Time: 12/09/21  8:02 AM  Result Value Ref Range   Glucose-Capillary 146 (H) 70 - 99 mg/dL    Comment: Glucose reference range applies only to samples taken after fasting for at least 8 hours.  CBG monitoring, ED     Status: Abnormal   Collection Time: 12/09/21 11:17 AM  Result Value Ref Range   Glucose-Capillary 137 (H) 70 - 99 mg/dL    Comment: Glucose reference range applies only to samples taken after fasting for at least 8 hours.  CBG monitoring, ED     Status: Abnormal   Collection Time: 12/09/21  3:54 PM  Result Value Ref Range   Glucose-Capillary 120 (H) 70 - 99 mg/dL    Comment: Glucose reference range applies only to samples taken after fasting for at least 8 hours.  CSF cell count with differential collection tube #: 1     Status: Abnormal   Collection Time: 12/09/21  4:40 PM  Result Value Ref Range   Tube # 1    Color, CSF PINK (A) COLORLESS   Appearance, CSF HAZY (A) CLEAR   Supernatant COLORLESS    RBC Count, CSF 2,848 (H) 0 - 3  /cu mm   WBC, CSF 184 (HH) 0 - 5 /cu mm    Comment: CRITICAL RESULT CALLED TO, READ BACK BY AND VERIFIED WITH: ASHLEY ELLWANGER ON 12/09/21 AT 1759 QSD    Segmented Neutrophils-CSF 38 %   Lymphs, CSF 43 %   Monocyte-Macrophage-Spinal Fluid 19 %   Eosinophils, CSF 0 %    Comment: Performed at [facility], [address] [place], [place] [address]  CSF cell count with differential     Status: Abnormal   Collection Time: 12/09/21  4:40 PM  Result Value Ref Range   Tube # 1    Color, CSF PINK (A) COLORLESS   Appearance, CSF HAZY (A) CLEAR   Supernatant COLORLESS    RBC Count, CSF 1,987 (H) 0 - 3 /cu mm   WBC, CSF 126 (HH) 0 - 5 /cu mm    Comment: CRITICAL RESULT CALLED TO, READ BACK BY AND VERIFIED WITH: ASHLEY ELLWANGER ON 12/09/21 AT 1759 QSD    Segmented Neutrophils-CSF 58 %   Lymphs, CSF 15 %   Monocyte-Macrophage-Spinal Fluid 27 %   Eosinophils, CSF 0 %    Comment: Performed at [facility], [address] [place], [place] [address]  CSF culture w Gram Stain     Status: None   Collection Time: 12/09/21  4:40 PM   Specimen: CSF; Cerebrospinal Fluid  Result Value Ref Range   Specimen Description      CSF Performed at [facility], [address], [place], [place] [address]    Special Requests      NONE Performed at [facility], [place], [place] [address]    Gram Stain      WBC SEEN NO ORGANISMS SEEN RED BLOOD CELLS Performed at  [facility], [address], [place], [place] [address]    Culture      NO GROWTH 3 DAYS Performed at [facility], [place] [address] [place], [place] [address]    Report Status 12/13/2021 FINAL   Protein and glucose, CSF     Status: Abnormal   Collection Time: 12/09/21  4:40 PM  Result Value Ref Range   Glucose, CSF 84 (H) 40 - 70 mg/dL   Total  Protein, CSF 78 (H) 15 - 45 mg/dL    Comment: Performed at [facility], [address] [place], [place]  [address]  Meningitis/Encephalitis Panel (CSF)     Status: None   Collection Time: 12/09/21  4:40 PM  Result Value Ref Range   Cryptococcus neoformans/gattii (CSF) NOT DETECTED NOT DETECTED    Comment: (NOTE) Patients with a suspicion of cryptococcal meningitis should be tested  for cryptococcal antigen (CrAg).   CORRECTED ON 11/27 AT 2026: PREVIOUSLY REPORTED AS NOT DETECTED    Cytomegalovirus (CSF) NOT DETECTED NOT DETECTED   Enterovirus (CSF) NOT DETECTED NOT DETECTED   Escherichia coli K1 (CSF) NOT DETECTED NOT DETECTED    Comment: (NOTE) Only E. coli strains possessing the K1 capsular antigen will be detected.   CORRECTED ON 11/27 AT 2026: PREVIOUSLY REPORTED AS NOT DETECTED    Haemophilus influenzae (CSF) NOT DETECTED NOT DETECTED   Herpes simplex virus 1 (CSF) NOT DETECTED NOT DETECTED   Herpes simplex virus 2 (CSF) NOT DETECTED NOT DETECTED   Human herpesvirus 6 (CSF) NOT DETECTED NOT DETECTED   Human parechovirus (CSF) NOT DETECTED NOT DETECTED   Listeria monocytogenes (CSF) NOT DETECTED NOT DETECTED   Neisseria meningitis (CSF) NOT DETECTED NOT DETECTED    Comment: (NOTE) Only encapsulated strains of N. meningitidis will be detected.  CORRECTED ON 11/27 AT 2026: PREVIOUSLY REPORTED AS NOT DETECTED    Streptococcus agalactiae (CSF) NOT DETECTED NOT DETECTED   Streptococcus pneumoniae (CSF) NOT DETECTED NOT DETECTED   Varicella zoster virus (CSF) NOT DETECTED NOT DETECTED    Comment: Performed at [facility] [address] [place], [place] [address]  VDRL, CSF     Status: None   Collection Time: 12/09/21  4:40 PM  Result Value Ref Range   VDRL Quant, CSF Non Reactive Non Rea:<1:1    Comment: (NOTE) Performed At: [facility] [address] [place], [place] [id number] Rush Farmer MD YT:[id number]   CBG monitoring, ED     Status: Abnormal   Collection Time: 12/09/21  5:28 PM  Result Value Ref Range   Glucose-Capillary 130 (H) 70 - 99 mg/dL     Comment: Glucose reference range applies only to samples taken after fasting for at least 8 hours.   Comment 1 Notify RN    Comment 2 Document in Chart   CBG monitoring, ED     Status: Abnormal   Collection Time: 12/09/21  8:28 PM  Result Value Ref Range   Glucose-Capillary 108 (H) 70 - 99 mg/dL    Comment: Glucose reference range applies only to samples taken after fasting for at least 8 hours.  CBG monitoring, ED     Status: Abnormal   Collection Time: 12/09/21 11:50 PM  Result Value Ref Range   Glucose-Capillary 118 (H) 70 - 99 mg/dL    Comment: Glucose reference range applies only to samples taken after fasting for at least 8 hours.  CBG monitoring, ED     Status: Abnormal   Collection  Time: 12/10/21  4:12 AM  Result Value Ref Range   Glucose-Capillary 113 (H) 70 - 99 mg/dL    Comment: Glucose reference range applies only to samples taken after fasting for at least 8 hours.  CBG monitoring, ED     Status: Abnormal   Collection Time: 12/10/21  7:32 AM  Result Value Ref Range   Glucose-Capillary 143 (H) 70 - 99 mg/dL    Comment: Glucose reference range applies only to samples taken after fasting for at least 8 hours.  Creatinine, serum     Status: None   Collection Time: 12/10/21  7:40 AM  Result Value Ref Range   Creatinine, Ser 0.62 0.44 - 1.00 mg/dL   GFR, Estimated >60 >60 mL/min    Comment: (NOTE) Calculated using the CKD-EPI Creatinine Equation (2021) Performed at [facility], [address] [place], [place] [address]   CBG monitoring, ED     Status: Abnormal   Collection Time: 12/10/21 11:54 AM  Result Value Ref Range   Glucose-Capillary 139 (H) 70 - 99 mg/dL    Comment: Glucose reference range applies only to samples taken after fasting for at least 8 hours.  CBG monitoring, ED     Status: Abnormal   Collection Time: 12/10/21  4:42 PM  Result Value Ref Range   Glucose-Capillary 135 (H) 70 - 99 mg/dL    Comment: Glucose reference range applies only to  samples taken after fasting for at least 8 hours.  CBG monitoring, ED     Status: Abnormal   Collection Time: 12/10/21  8:06 PM  Result Value Ref Range   Glucose-Capillary 101 (H) 70 - 99 mg/dL    Comment: Glucose reference range applies only to samples taken after fasting for at least 8 hours.  CBG monitoring, ED     Status: Abnormal   Collection Time: 12/10/21 11:51 PM  Result Value Ref Range   Glucose-Capillary 124 (H) 70 - 99 mg/dL    Comment: Glucose reference range applies only to samples taken after fasting for at least 8 hours.  Glucose, capillary     Status: Abnormal   Collection Time: 12/11/21  5:03 AM  Result Value Ref Range   Glucose-Capillary 124 (H) 70 - 99 mg/dL    Comment: Glucose reference range applies only to samples taken after fasting for at least 8 hours.  CBC with Differential/Platelet     Status: Abnormal   Collection Time: 12/11/21  6:44 AM  Result Value Ref Range   WBC 8.7 4.0 - 10.5 K/uL   RBC 5.15 (H) 3.87 - 5.11 MIL/uL   Hemoglobin 13.7 12.0 - 15.0 g/dL   HCT 42.0 36.0 - 46.0 %   MCV 81.6 80.0 - 100.0 fL   MCH 26.6 26.0 - 34.0 pg   MCHC 32.6 30.0 - 36.0 g/dL   RDW 14.2 11.5 - 15.5 %   Platelets 314 150 - 400 K/uL   nRBC 0.0 0.0 - 0.2 %   Neutrophils Relative % 56 %   Neutro Abs 4.9 1.7 - 7.7 K/uL   Lymphocytes Relative 32 %   Lymphs Abs 2.8 0.7 - 4.0 K/uL   Monocytes Relative 8 %   Monocytes Absolute 0.7 0.1 - 1.0 K/uL   Eosinophils Relative 2 %   Eosinophils Absolute 0.2 0.0 - 0.5 K/uL   Basophils Relative 1 %   Basophils Absolute 0.1 0.0 - 0.1 K/uL   Immature Granulocytes 1 %   Abs Immature Granulocytes 0.05 0.00 - 0.07  K/uL    Comment: Performed at [facility], [address] [place], [place] [address]  Basic metabolic panel     Status: Abnormal   Collection Time: 12/11/21  6:44 AM  Result Value Ref Range   Sodium 139 135 - 145 mmol/L   Potassium 2.5 (LL) 3.5 - 5.1 mmol/L    Comment: CRITICAL RESULT CALLED TO, READ BACK BY AND  VERIFIED WITH JESSICA TAYLOR 12/11/21 0822 MW    Chloride 103 98 - 111 mmol/L   CO2 26 22 - 32 mmol/L   Glucose, Bld 119 (H) 70 - 99 mg/dL    Comment: Glucose reference range applies only to samples taken after fasting for at least 8 hours.   BUN 12 6 - 20 mg/dL   Creatinine, Ser 0.71 0.44 - 1.00 mg/dL   Calcium 8.9 8.9 - 10.3 mg/dL   GFR, Estimated >60 >60 mL/min    Comment: (NOTE) Calculated using the CKD-EPI Creatinine Equation (2021)    Anion gap 10 5 - 15    Comment: Performed at [facility], [address] [place], [place] [address]  HIV Antibody (routine testing w rflx)     Status: None   Collection Time: 12/11/21  6:44 AM  Result Value Ref Range   HIV Screen 4th Generation wRfx Non Reactive Non Reactive    Comment: Performed at [facility] [address] [place], [place] [address]  Magnesium     Status: None   Collection Time: 12/11/21  6:44 AM  Result Value Ref Range   Magnesium 2.1 1.7 - 2.4 mg/dL    Comment: Performed at [facility], [address] [place], [place] [address]  Glucose, capillary     Status: Abnormal   Collection Time: 12/11/21  7:44 AM  Result Value Ref Range   Glucose-Capillary 119 (H) 70 - 99 mg/dL    Comment: Glucose reference range applies only to samples taken after fasting for at least 8 hours.  Glucose, capillary     Status: Abnormal   Collection Time: 12/11/21 11:29 AM  Result Value Ref Range   Glucose-Capillary 182 (H) 70 - 99 mg/dL    Comment: Glucose reference range applies only to samples taken after fasting for at least 8 hours.  Basic metabolic panel     Status: Abnormal   Collection Time: 12/11/21  2:16 PM  Result Value Ref Range   Sodium 137 135 - 145 mmol/L   Potassium 3.6 3.5 - 5.1 mmol/L   Chloride 105 98 - 111 mmol/L   CO2 23 22 - 32 mmol/L   Glucose, Bld 130 (H) 70 - 99 mg/dL    Comment: Glucose reference range applies only to samples taken after fasting for at least 8 hours.   BUN 12 6 - 20  mg/dL   Creatinine, Ser 0.75 0.44 - 1.00 mg/dL   Calcium 8.6 (L) 8.9 - 10.3 mg/dL   GFR, Estimated >60 >60 mL/min    Comment: (NOTE) Calculated using the CKD-EPI Creatinine Equation (2021)    Anion gap 9 5 - 15    Comment: Performed at [facility] [address] [place], [place] [address]  Magnesium     Status: None   Collection Time: 12/11/21  2:16 PM  Result Value Ref Range   Magnesium 2.0 1.7 - 2.4 mg/dL    Comment: Performed at [facility], [address] [place], [place] [address]  Glucose, capillary     Status: Abnormal  Collection Time: 12/11/21  3:35 PM  Result Value Ref Range   Glucose-Capillary 152 (H) 70 - 99 mg/dL    Comment: Glucose reference range applies only to samples taken after fasting for at least 8 hours.  Glucose, capillary     Status: Abnormal   Collection Time: 12/11/21  7:37 PM  Result Value Ref Range   Glucose-Capillary 148 (H) 70 - 99 mg/dL    Comment: Glucose reference range applies only to samples taken after fasting for at least 8 hours.  Glucose, capillary     Status: Abnormal   Collection Time: 12/12/21 12:02 AM  Result Value Ref Range   Glucose-Capillary 131 (H) 70 - 99 mg/dL    Comment: Glucose reference range applies only to samples taken after fasting for at least 8 hours.  Glucose, capillary     Status: Abnormal   Collection Time: 12/12/21  4:03 AM  Result Value Ref Range   Glucose-Capillary 117 (H) 70 - 99 mg/dL    Comment: Glucose reference range applies only to samples taken after fasting for at least 8 hours.  Glucose, capillary     Status: Abnormal   Collection Time: 12/12/21  9:02 AM  Result Value Ref Range   Glucose-Capillary 135 (H) 70 - 99 mg/dL    Comment: Glucose reference range applies only to samples taken after fasting for at least 8 hours.  Glucose, capillary     Status: Abnormal   Collection Time: 12/12/21  1:12 PM  Result Value Ref Range   Glucose-Capillary 227 (H) 70 - 99 mg/dL    Comment:  Glucose reference range applies only to samples taken after fasting for at least 8 hours.  Glucose, capillary     Status: Abnormal   Collection Time: 12/12/21  2:49 PM  Result Value Ref Range   Glucose-Capillary 126 (H) 70 - 99 mg/dL    Comment: Glucose reference range applies only to samples taken after fasting for at least 8 hours.  Glucose, capillary     Status: None   Collection Time: 12/12/21  4:35 PM  Result Value Ref Range   Glucose-Capillary 91 70 - 99 mg/dL    Comment: Glucose reference range applies only to samples taken after fasting for at least 8 hours.  Glucose, capillary     Status: Abnormal   Collection Time: 12/12/21  9:23 PM  Result Value Ref Range   Glucose-Capillary 182 (H) 70 - 99 mg/dL    Comment: Glucose reference range applies only to samples taken after fasting for at least 8 hours.  Glucose, capillary     Status: Abnormal   Collection Time: 12/13/21 12:12 AM  Result Value Ref Range   Glucose-Capillary 116 (H) 70 - 99 mg/dL    Comment: Glucose reference range applies only to samples taken after fasting for at least 8 hours.  Glucose, capillary     Status: Abnormal   Collection Time: 12/13/21  4:24 AM  Result Value Ref Range   Glucose-Capillary 118 (H) 70 - 99 mg/dL    Comment: Glucose reference range applies only to samples taken after fasting for at least 8 hours.  Glucose, capillary     Status: Abnormal   Collection Time: 12/13/21  8:45 AM  Result Value Ref Range   Glucose-Capillary 140 (H) 70 - 99 mg/dL    Comment: Glucose reference range applies only to samples taken after fasting for at least 8 hours.  Glucose, capillary     Status: Abnormal   Collection Time:  12/13/21 11:48 AM  Result Value Ref Range   Glucose-Capillary 184 (H) 70 - 99 mg/dL    Comment: Glucose reference range applies only to samples taken after fasting for at least 8 hours.  Glucose, capillary     Status: Abnormal   Collection Time: 12/13/21  3:38 PM  Result Value Ref Range    Glucose-Capillary 152 (H) 70 - 99 mg/dL    Comment: Glucose reference range applies only to samples taken after fasting for at least 8 hours.  Glucose, capillary     Status: Abnormal   Collection Time: 12/13/21  8:55 PM  Result Value Ref Range   Glucose-Capillary 138 (H) 70 - 99 mg/dL    Comment: Glucose reference range applies only to samples taken after fasting for at least 8 hours.  Glucose, capillary     Status: Abnormal   Collection Time: 12/13/21 11:56 PM  Result Value Ref Range   Glucose-Capillary 157 (H) 70 - 99 mg/dL    Comment: Glucose reference range applies only to samples taken after fasting for at least 8 hours.  Glucose, capillary     Status: Abnormal   Collection Time: 12/14/21  4:25 AM  Result Value Ref Range   Glucose-Capillary 159 (H) 70 - 99 mg/dL    Comment: Glucose reference range applies only to samples taken after fasting for at least 8 hours.  Glucose, capillary     Status: Abnormal   Collection Time: 12/14/21  8:00 AM  Result Value Ref Range   Glucose-Capillary 141 (H) 70 - 99 mg/dL    Comment: Glucose reference range applies only to samples taken after fasting for at least 8 hours.    -------------------------------------------------------------------------- A&P:  Problem List Items Addressed This Visit     Centrilobular emphysema (HCC)   Chronic pain syndrome   Relevant Medications   methocarbamol (ROBAXIN) 500 MG tablet   oxyCODONE-acetaminophen (PERCOCET) 10-325 MG tablet   Hemiparesis of right dominant side as late effect of cerebrovascular disease ([place]) - Primary   History of cerebrovascular accident (CVA) with residual deficit   Other Visit Diagnoses     Acute cystitis with hematuria       Relevant Medications   sulfamethoxazole-trimethoprim (BACTRIM DS) 800-160 MG tablet      ED visits reviewed  Will re order Bactrim for UTI that is unresolved possibly May be bladder OAB urinary incontinence. She can continue Oxybutynin from  ED  Chronic Pain Syndrome Opiate management chronically on suboxone previously Now off suboxone for past 1 week Had been on oxycodone AS NEEDED for pain management before Due to her current Physical therapy demands, and goal for improved mobility will agree to order the percocet 10/325 twice a day max for 30 days and discussed I cannot manage it long term. 30-90 days max while we are virtual and she will need to locate to new provider in [facility] area if she is there longer than 3 months with pain management needs.  Generalized Weakness - worsening with recent illness hospital stay History of CVA w/ R sided residual deficit hemiparesis weakness Limiting her mobility and function   Currently overall functional decline with difficulty with performing her ADLs, based on current situation she is not able to care for her self at home. She has needed assistance from her son and daughter in law as caretakers for her  She will benefit from [place] in addition to home health physical therapy OT nursing care.  Form has been completed and ready  to be faxed 01/14/22   Meds ordered this encounter  Medications   sulfamethoxazole-trimethoprim (BACTRIM DS) 800-160 MG tablet    Sig: Take 1 tablet by mouth 2 (two) times daily.    Dispense:  14 tablet    Refill:  0   oxyCODONE-acetaminophen (PERCOCET) 10-325 MG tablet    Sig: Take 1 tablet by mouth every 8 (eight) hours as needed for pain.    Dispense:  60 tablet    Refill:  0    Follow-up: - Return in 30 days for virtual follow up pain medicine  I did recommend that she schedule with new PCP while in [facility] due to long distance from our office. She may need additional in person medical care while she is there working on her physical rehab and progress. I advised I can only manage some of her concerns remotely for 30-90 days for now. She plans to move back to this area.  Patient verbalizes understanding with the above  medical recommendations including the limitation of remote medical advice.  Specific follow-up and call-back criteria were given for patient to follow-up or seek medical care more urgently if needed.   - Time spent in direct consultation with patient on phone: 15 minutes   Nobie Putnam, [facility] 01/10/2022, 4:24 PM

## 2022-01-10 NOTE — Patient Instructions (Addendum)
   Please schedule a Follow-up Appointment to: Return in about 4 weeks (around 02/07/2022) for 30 day virtual follow up for pain medicine.  If you have any other questions or concerns, please feel free to call the office or send a message through [place]. You may also schedule an earlier appointment if necessary.  Additionally, you may be receiving a survey about your experience at our office within a few days to 1 week by e-mail or mail. We value your feedback.  Nobie Putnam, DO [place]

## 2022-01-13 ENCOUNTER — Encounter: Payer: Self-pay | Admitting: Family Medicine

## 2022-01-13 DIAGNOSIS — G8929 Other chronic pain: Secondary | ICD-10-CM

## 2022-01-14 ENCOUNTER — Encounter: Payer: Self-pay | Admitting: Family Medicine

## 2022-01-14 ENCOUNTER — Ambulatory Visit: Payer: Self-pay | Admitting: *Deleted

## 2022-01-14 DIAGNOSIS — G8929 Other chronic pain: Secondary | ICD-10-CM | POA: Insufficient documentation

## 2022-01-14 DIAGNOSIS — M545 Low back pain, unspecified: Secondary | ICD-10-CM | POA: Insufficient documentation

## 2022-01-14 NOTE — Patient Instructions (Signed)
Visit Information  Thank you for taking time to visit with me today. Please don't hesitate to contact me if I can be of assistance to you.   Following are the goals we discussed today:   Goals Addressed             This Visit's Progress    Care Coordination needs       Care Coordination Interventions: Follow up phone call to patient and patient's daughter in-law [name] regarding requested DME and [place] follow up Patient continues to  reside in [facility] with her son and daughter in law Alvis Lemmings [facility] following-patient has requested orders for lab work and a bariatric bed Patient further confirmed call to [facility] [employer] [facility] to change delivery address for her incontinent supplies Patient's daughter in law previously confirmed that patient has a wheelchair, hospital bed, hoyer lift and bedside commode. Patient's daughter in law looking to purchase  a shower chair as well Patient's daughter in law encouraged to identify a local primary care doctor for patient Patient's daughter in law agreeable and states she working on arranging for a local provider-she will base this on  This social worker encouraged patient to call this social worker with any additional community resource needs         Please call the care guide team at [phone] if you need to cancel or reschedule your appointment.   If you are experiencing a Mental Health or [place] or need someone to talk to, please call 911   Patient verbalizes understanding of instructions and care plan provided today and agrees to view in [place]. Active MyChart status and patient understanding of how to access instructions and care plan via MyChart confirmed with patient.     No further follow up required: Patient to contact this Education officer, museum with any additional community resource needs  [employer], [place] Worker  [place] [facility] [phone]

## 2022-01-14 NOTE — Patient Outreach (Signed)
  Care Coordination   Follow Up Visit Note   01/14/2022 Name: [name] MRN: [id number] DOB: 12-08-1965  [name] is a 57 y.o. year old female who sees Olin Hauser, DO for primary care. I spoke with  [name] and her daughter in law by phone today.  What matters to the patients health and wellness today?  HH and DME    Goals Addressed             This Visit's Progress    Care Coordination needs       Care Coordination Interventions: Follow up phone call to patient and patient's daughter in-law [name] regarding requested DME and [place] follow up Patient continues to  reside in [facility] with her son and daughter in law Alvis Lemmings [facility] following-patient has requested orders for lab work and a bariatric bed Patient further confirmed call to [facility] to change delivery address for her incontinent supplies Patient's daughter in law previously confirmed that patient has a wheelchair, hospital bed, hoyer lift and bedside commode. Patient's daughter in law looking to purchase  a shower chair as well Patient's daughter in law encouraged to identify a local primary care doctor for patient Patient's daughter in law agreeable and states she working on arranging for a local provider-she will base this on  This social worker encouraged patient to call this Education officer, museum with any additional community resource needs         SDOH assessments and interventions completed:  No     Care Coordination Interventions:  Yes, provided   Follow up plan: No further intervention required.   Encounter Outcome:  Pt. Visit Completed

## 2022-01-16 ENCOUNTER — Encounter: Payer: Self-pay | Admitting: Family Medicine

## 2022-01-20 ENCOUNTER — Other Ambulatory Visit: Payer: Self-pay | Admitting: Family Medicine

## 2022-01-20 DIAGNOSIS — E782 Mixed hyperlipidemia: Secondary | ICD-10-CM

## 2022-01-21 NOTE — Telephone Encounter (Signed)
Requested medications are due for refill today.  yes  Requested medications are on the active medications list.  yes  Last refill. 11/19/2021 #90 0 rf  Future visit scheduled.   no  Notes to clinic.  Labs are expired.    Requested Prescriptions  Pending Prescriptions Disp Refills   atorvastatin (LIPITOR) 40 MG tablet [Pharmacy Med Name: Atorvastatin Calcium 40 MG Oral Tablet] 90 tablet 3    Sig: TAKE 1 TABLET BY MOUTH AT  BEDTIME     Cardiovascular:  Antilipid - Statins Failed - 01/20/2022  9:30 AM      Failed - Lipid Panel in normal range within the last 12 months    Cholesterol  Date Value Ref Range Status  04/07/2017 136 0 - 200 mg/dL Final  10/07/2012 207 (H) 0 - 200 mg/dL Final   Ldl Cholesterol, Calc  Date Value Ref Range Status  10/07/2012 127 (H) 0 - 100 mg/dL Final   LDL Cholesterol  Date Value Ref Range Status  04/07/2017 67 0 - 99 mg/dL Final    Comment:           Total Cholesterol/HDL:CHD Risk Coronary Heart Disease Risk Table                     Men   Women  1/2 Average Risk   3.4   3.3  Average Risk       5.0   4.4  2 X Average Risk   9.6   7.1  3 X Average Risk  23.4   11.0        Use the calculated Patient Ratio above and the CHD Risk Table to determine the patient's CHD Risk.        ATP III CLASSIFICATION (LDL):  <100     mg/dL   Optimal  100-129  mg/dL   Near or Above                    Optimal  130-159  mg/dL   Borderline  160-189  mg/dL   High  >190     mg/dL   Very High Performed at [facility] [address], [place], [place] [address]    HDL Cholesterol  Date Value Ref Range Status  10/07/2012 25 (L) 40 - 60 mg/dL Final   HDL  Date Value Ref Range Status  04/07/2017 31 (L) >40 mg/dL Final   Triglycerides  Date Value Ref Range Status  04/07/2017 191 (H) <150 mg/dL Final  10/07/2012 273 (H) 0 - 200 mg/dL Final         Passed - Patient is not pregnant      Passed - Valid encounter within last 12 months    Recent  Outpatient Visits           1 week ago Hemiparesis of right dominant side as late effect of cerebrovascular disease, unspecified cerebrovascular disease type ([place])   [facility], Devonne Doughty, DO   3 weeks ago Type 2 diabetes mellitus with other specified complication, without long-term current use of insulin ([place])   [place], Grayland Ormond A, RPH-CPP   1 month ago History of cerebrovascular accident (CVA) with residual deficit   [place], DO   1 month ago Oral thrush   [facility], [place] W, NP   1 month ago Type 2 diabetes mellitus with other specified complication, without  long-term current use of insulin [facility])   [place], RPH-CPP

## 2022-01-27 ENCOUNTER — Telehealth: Payer: 59

## 2022-01-27 ENCOUNTER — Telehealth: Payer: Self-pay | Admitting: Pharmacist

## 2022-01-27 NOTE — Telephone Encounter (Signed)
  Chronic Care Management   Outreach Note  01/27/2022 Name: MAKENAH KARAS MRN: [id number] DOB: 15-Oct-1965  Referred by: Olin Hauser, DO Reason for referral : No chief complaint on file.   Was unable to reach patient via telephone today and have left HIPAA compliant voicemail asking patient to return my call.   Follow Up Plan: Will attempt to reach patient by telephone again within the next 30 days  Wallace Cullens, PharmD, [place] [facility] [phone]

## 2022-02-01 ENCOUNTER — Other Ambulatory Visit: Payer: Self-pay | Admitting: Family Medicine

## 2022-02-01 DIAGNOSIS — G894 Chronic pain syndrome: Secondary | ICD-10-CM

## 2022-02-03 MED ORDER — OXYCODONE-ACETAMINOPHEN 10-325 MG PO TABS: 1.0000 | ORAL_TABLET | Freq: Three times a day (TID) | ORAL | 0 refills | Status: DC | PRN

## 2022-02-06 MED ORDER — MOUNJARO 5 MG/0.5ML ~~LOC~~ SOAJ: 5.0000 mg | SUBCUTANEOUS | 2 refills | Status: DC

## 2022-02-06 NOTE — Addendum Note (Signed)
Addended by: Olin Hauser on: 02/06/2022 03:30 PM   Modules accepted: Orders

## 2022-02-06 NOTE — Telephone Encounter (Signed)
Patient called in and said she was told by the pharmacy she needs a prior authorization for this prescription. It's been two weeks since she ran out of the medication. Patient says no symptoms but wants to go ahead and have the authorization completed so pharmacy is able to fill the prescription for her. Please assist patient further.

## 2022-02-06 NOTE — Telephone Encounter (Signed)
Re ordered Mounjaro '5mg'$  weekly inj to [facility].  Sounds like it will require Prior Authorization.  [name], [name] 02/06/2022, 3:29 PM

## 2022-02-13 ENCOUNTER — Encounter: Payer: Self-pay | Admitting: Family Medicine

## 2022-02-13 ENCOUNTER — Other Ambulatory Visit: Payer: Self-pay | Admitting: Family Medicine

## 2022-02-13 DIAGNOSIS — I639 Cerebral infarction, unspecified: Secondary | ICD-10-CM | POA: Diagnosis not present

## 2022-02-13 DIAGNOSIS — M545 Low back pain, unspecified: Secondary | ICD-10-CM | POA: Diagnosis not present

## 2022-02-13 DIAGNOSIS — G894 Chronic pain syndrome: Secondary | ICD-10-CM

## 2022-02-13 DIAGNOSIS — R54 Age-related physical debility: Secondary | ICD-10-CM | POA: Diagnosis not present

## 2022-02-13 DIAGNOSIS — B379 Candidiasis, unspecified: Secondary | ICD-10-CM

## 2022-02-13 DIAGNOSIS — Z993 Dependence on wheelchair: Secondary | ICD-10-CM | POA: Diagnosis not present

## 2022-02-13 DIAGNOSIS — B372 Candidiasis of skin and nail: Secondary | ICD-10-CM

## 2022-02-13 DIAGNOSIS — T3695XA Adverse effect of unspecified systemic antibiotic, initial encounter: Secondary | ICD-10-CM

## 2022-02-13 DIAGNOSIS — E559 Vitamin D deficiency, unspecified: Secondary | ICD-10-CM

## 2022-02-13 DIAGNOSIS — J449 Chronic obstructive pulmonary disease, unspecified: Secondary | ICD-10-CM | POA: Diagnosis not present

## 2022-02-14 DIAGNOSIS — Z7951 Long term (current) use of inhaled steroids: Secondary | ICD-10-CM | POA: Diagnosis not present

## 2022-02-14 DIAGNOSIS — Z7985 Long-term (current) use of injectable non-insulin antidiabetic drugs: Secondary | ICD-10-CM | POA: Diagnosis not present

## 2022-02-14 DIAGNOSIS — I10 Essential (primary) hypertension: Secondary | ICD-10-CM | POA: Diagnosis not present

## 2022-02-14 DIAGNOSIS — E7800 Pure hypercholesterolemia, unspecified: Secondary | ICD-10-CM | POA: Diagnosis not present

## 2022-02-14 DIAGNOSIS — J439 Emphysema, unspecified: Secondary | ICD-10-CM | POA: Diagnosis not present

## 2022-02-14 DIAGNOSIS — Z7902 Long term (current) use of antithrombotics/antiplatelets: Secondary | ICD-10-CM | POA: Diagnosis not present

## 2022-02-14 DIAGNOSIS — Z9181 History of falling: Secondary | ICD-10-CM | POA: Diagnosis not present

## 2022-02-14 DIAGNOSIS — J449 Chronic obstructive pulmonary disease, unspecified: Secondary | ICD-10-CM | POA: Diagnosis not present

## 2022-02-14 DIAGNOSIS — Z96659 Presence of unspecified artificial knee joint: Secondary | ICD-10-CM | POA: Diagnosis not present

## 2022-02-14 DIAGNOSIS — Z7984 Long term (current) use of oral hypoglycemic drugs: Secondary | ICD-10-CM | POA: Diagnosis not present

## 2022-02-14 DIAGNOSIS — N3001 Acute cystitis with hematuria: Secondary | ICD-10-CM | POA: Diagnosis not present

## 2022-02-14 DIAGNOSIS — B379 Candidiasis, unspecified: Secondary | ICD-10-CM | POA: Diagnosis not present

## 2022-02-14 DIAGNOSIS — L89521 Pressure ulcer of left ankle, stage 1: Secondary | ICD-10-CM | POA: Diagnosis not present

## 2022-02-14 DIAGNOSIS — F32A Depression, unspecified: Secondary | ICD-10-CM | POA: Diagnosis not present

## 2022-02-14 DIAGNOSIS — E119 Type 2 diabetes mellitus without complications: Secondary | ICD-10-CM | POA: Diagnosis not present

## 2022-02-14 DIAGNOSIS — I69351 Hemiplegia and hemiparesis following cerebral infarction affecting right dominant side: Secondary | ICD-10-CM | POA: Diagnosis not present

## 2022-02-14 DIAGNOSIS — F17210 Nicotine dependence, cigarettes, uncomplicated: Secondary | ICD-10-CM | POA: Diagnosis not present

## 2022-02-14 DIAGNOSIS — T3695XD Adverse effect of unspecified systemic antibiotic, subsequent encounter: Secondary | ICD-10-CM | POA: Diagnosis not present

## 2022-02-14 MED ORDER — NYSTATIN 100000 UNIT/GM EX POWD: 1.0000 | Freq: Three times a day (TID) | CUTANEOUS | 2 refills | Status: DC | PRN

## 2022-02-14 MED ORDER — FLUCONAZOLE 150 MG PO TABS: ORAL_TABLET | ORAL | 0 refills | Status: DC

## 2022-02-14 MED ORDER — VITAMIN D (ERGOCALCIFEROL) 1.25 MG (50000 UNIT) PO CAPS: 50000.0000 [IU] | ORAL_CAPSULE | ORAL | 2 refills | Status: DC

## 2022-02-14 MED ORDER — OXYCODONE-ACETAMINOPHEN 10-325 MG PO TABS: 1.0000 | ORAL_TABLET | Freq: Three times a day (TID) | ORAL | 0 refills | Status: DC | PRN

## 2022-02-18 DIAGNOSIS — Z96659 Presence of unspecified artificial knee joint: Secondary | ICD-10-CM | POA: Diagnosis not present

## 2022-02-18 DIAGNOSIS — N3001 Acute cystitis with hematuria: Secondary | ICD-10-CM | POA: Diagnosis not present

## 2022-02-18 DIAGNOSIS — B379 Candidiasis, unspecified: Secondary | ICD-10-CM | POA: Diagnosis not present

## 2022-02-18 DIAGNOSIS — Z7984 Long term (current) use of oral hypoglycemic drugs: Secondary | ICD-10-CM | POA: Diagnosis not present

## 2022-02-18 DIAGNOSIS — Z7902 Long term (current) use of antithrombotics/antiplatelets: Secondary | ICD-10-CM | POA: Diagnosis not present

## 2022-02-18 DIAGNOSIS — Z9181 History of falling: Secondary | ICD-10-CM | POA: Diagnosis not present

## 2022-02-18 DIAGNOSIS — E119 Type 2 diabetes mellitus without complications: Secondary | ICD-10-CM | POA: Diagnosis not present

## 2022-02-18 DIAGNOSIS — L89521 Pressure ulcer of left ankle, stage 1: Secondary | ICD-10-CM | POA: Diagnosis not present

## 2022-02-18 DIAGNOSIS — F17210 Nicotine dependence, cigarettes, uncomplicated: Secondary | ICD-10-CM | POA: Diagnosis not present

## 2022-02-18 DIAGNOSIS — I69351 Hemiplegia and hemiparesis following cerebral infarction affecting right dominant side: Secondary | ICD-10-CM | POA: Diagnosis not present

## 2022-02-18 DIAGNOSIS — F32A Depression, unspecified: Secondary | ICD-10-CM | POA: Diagnosis not present

## 2022-02-18 DIAGNOSIS — I10 Essential (primary) hypertension: Secondary | ICD-10-CM | POA: Diagnosis not present

## 2022-02-18 DIAGNOSIS — J449 Chronic obstructive pulmonary disease, unspecified: Secondary | ICD-10-CM | POA: Diagnosis not present

## 2022-02-18 DIAGNOSIS — T3695XD Adverse effect of unspecified systemic antibiotic, subsequent encounter: Secondary | ICD-10-CM | POA: Diagnosis not present

## 2022-02-18 DIAGNOSIS — J439 Emphysema, unspecified: Secondary | ICD-10-CM | POA: Diagnosis not present

## 2022-02-18 DIAGNOSIS — Z7951 Long term (current) use of inhaled steroids: Secondary | ICD-10-CM | POA: Diagnosis not present

## 2022-02-18 DIAGNOSIS — Z7985 Long-term (current) use of injectable non-insulin antidiabetic drugs: Secondary | ICD-10-CM | POA: Diagnosis not present

## 2022-02-18 DIAGNOSIS — E7800 Pure hypercholesterolemia, unspecified: Secondary | ICD-10-CM | POA: Diagnosis not present

## 2022-02-19 DIAGNOSIS — E7800 Pure hypercholesterolemia, unspecified: Secondary | ICD-10-CM | POA: Diagnosis not present

## 2022-02-19 DIAGNOSIS — T3695XD Adverse effect of unspecified systemic antibiotic, subsequent encounter: Secondary | ICD-10-CM | POA: Diagnosis not present

## 2022-02-19 DIAGNOSIS — F32A Depression, unspecified: Secondary | ICD-10-CM | POA: Diagnosis not present

## 2022-02-19 DIAGNOSIS — J439 Emphysema, unspecified: Secondary | ICD-10-CM | POA: Diagnosis not present

## 2022-02-19 DIAGNOSIS — Z9181 History of falling: Secondary | ICD-10-CM | POA: Diagnosis not present

## 2022-02-19 DIAGNOSIS — L89521 Pressure ulcer of left ankle, stage 1: Secondary | ICD-10-CM | POA: Diagnosis not present

## 2022-02-19 DIAGNOSIS — Z7984 Long term (current) use of oral hypoglycemic drugs: Secondary | ICD-10-CM | POA: Diagnosis not present

## 2022-02-19 DIAGNOSIS — Z7951 Long term (current) use of inhaled steroids: Secondary | ICD-10-CM | POA: Diagnosis not present

## 2022-02-19 DIAGNOSIS — Z7985 Long-term (current) use of injectable non-insulin antidiabetic drugs: Secondary | ICD-10-CM | POA: Diagnosis not present

## 2022-02-19 DIAGNOSIS — I69351 Hemiplegia and hemiparesis following cerebral infarction affecting right dominant side: Secondary | ICD-10-CM | POA: Diagnosis not present

## 2022-02-19 DIAGNOSIS — I10 Essential (primary) hypertension: Secondary | ICD-10-CM | POA: Diagnosis not present

## 2022-02-19 DIAGNOSIS — E119 Type 2 diabetes mellitus without complications: Secondary | ICD-10-CM | POA: Diagnosis not present

## 2022-02-19 DIAGNOSIS — Z96659 Presence of unspecified artificial knee joint: Secondary | ICD-10-CM | POA: Diagnosis not present

## 2022-02-19 DIAGNOSIS — F17210 Nicotine dependence, cigarettes, uncomplicated: Secondary | ICD-10-CM | POA: Diagnosis not present

## 2022-02-19 DIAGNOSIS — J449 Chronic obstructive pulmonary disease, unspecified: Secondary | ICD-10-CM | POA: Diagnosis not present

## 2022-02-19 DIAGNOSIS — Z7902 Long term (current) use of antithrombotics/antiplatelets: Secondary | ICD-10-CM | POA: Diagnosis not present

## 2022-02-19 DIAGNOSIS — N3001 Acute cystitis with hematuria: Secondary | ICD-10-CM | POA: Diagnosis not present

## 2022-02-19 DIAGNOSIS — B379 Candidiasis, unspecified: Secondary | ICD-10-CM | POA: Diagnosis not present

## 2022-02-26 ENCOUNTER — Telehealth: Payer: 59

## 2022-03-04 DIAGNOSIS — I10 Essential (primary) hypertension: Secondary | ICD-10-CM | POA: Diagnosis not present

## 2022-03-04 DIAGNOSIS — B379 Candidiasis, unspecified: Secondary | ICD-10-CM | POA: Diagnosis not present

## 2022-03-04 DIAGNOSIS — F17210 Nicotine dependence, cigarettes, uncomplicated: Secondary | ICD-10-CM | POA: Diagnosis not present

## 2022-03-04 DIAGNOSIS — Z96659 Presence of unspecified artificial knee joint: Secondary | ICD-10-CM | POA: Diagnosis not present

## 2022-03-04 DIAGNOSIS — R160 Hepatomegaly, not elsewhere classified: Secondary | ICD-10-CM | POA: Diagnosis not present

## 2022-03-04 DIAGNOSIS — J449 Chronic obstructive pulmonary disease, unspecified: Secondary | ICD-10-CM | POA: Diagnosis not present

## 2022-03-04 DIAGNOSIS — N3001 Acute cystitis with hematuria: Secondary | ICD-10-CM | POA: Diagnosis not present

## 2022-03-04 DIAGNOSIS — F32A Depression, unspecified: Secondary | ICD-10-CM | POA: Diagnosis not present

## 2022-03-04 DIAGNOSIS — J439 Emphysema, unspecified: Secondary | ICD-10-CM | POA: Diagnosis not present

## 2022-03-04 DIAGNOSIS — R531 Weakness: Secondary | ICD-10-CM | POA: Diagnosis not present

## 2022-03-04 DIAGNOSIS — Z7951 Long term (current) use of inhaled steroids: Secondary | ICD-10-CM | POA: Diagnosis not present

## 2022-03-04 DIAGNOSIS — N10 Acute tubulo-interstitial nephritis: Secondary | ICD-10-CM | POA: Diagnosis not present

## 2022-03-04 DIAGNOSIS — E119 Type 2 diabetes mellitus without complications: Secondary | ICD-10-CM | POA: Diagnosis not present

## 2022-03-04 DIAGNOSIS — Z7902 Long term (current) use of antithrombotics/antiplatelets: Secondary | ICD-10-CM | POA: Diagnosis not present

## 2022-03-04 DIAGNOSIS — E7800 Pure hypercholesterolemia, unspecified: Secondary | ICD-10-CM | POA: Diagnosis not present

## 2022-03-04 DIAGNOSIS — Z7984 Long term (current) use of oral hypoglycemic drugs: Secondary | ICD-10-CM | POA: Diagnosis not present

## 2022-03-04 DIAGNOSIS — T3695XD Adverse effect of unspecified systemic antibiotic, subsequent encounter: Secondary | ICD-10-CM | POA: Diagnosis not present

## 2022-03-04 DIAGNOSIS — Z7985 Long-term (current) use of injectable non-insulin antidiabetic drugs: Secondary | ICD-10-CM | POA: Diagnosis not present

## 2022-03-04 DIAGNOSIS — I69351 Hemiplegia and hemiparesis following cerebral infarction affecting right dominant side: Secondary | ICD-10-CM | POA: Diagnosis not present

## 2022-03-04 DIAGNOSIS — M549 Dorsalgia, unspecified: Secondary | ICD-10-CM | POA: Diagnosis not present

## 2022-03-04 DIAGNOSIS — L89521 Pressure ulcer of left ankle, stage 1: Secondary | ICD-10-CM | POA: Diagnosis not present

## 2022-03-04 DIAGNOSIS — Z9181 History of falling: Secondary | ICD-10-CM | POA: Diagnosis not present

## 2022-03-05 ENCOUNTER — Other Ambulatory Visit: Payer: Self-pay | Admitting: Family Medicine

## 2022-03-05 DIAGNOSIS — J432 Centrilobular emphysema: Secondary | ICD-10-CM

## 2022-03-06 NOTE — Telephone Encounter (Signed)
Requested medication (s) are due for refill today: no  Requested medication (s) are on the active medication list: yes  Last refill:  06/25/21 #60 11 RF  Future visit scheduled: no   Notes to clinic:  med not assigned to a protocol   Requested Prescriptions  Pending Prescriptions Disp [place] 100-62.5-25 MCG/ACT AEPB [Pharmacy Med Name: Trelegy Ellipta 100-62.5-25 MCG/INH Inhalation Aerosol Powder Breath Activated] 120 each 5    Sig: USE 1 INHALATION BY MOUTH ONCE  DAILY AT [place]     Off-Protocol Failed - 03/05/2022 10:23 PM      Failed - Medication not assigned to a protocol, review manually.      Passed - Valid encounter within last 12 months    Recent Outpatient Visits           1 month ago Hemiparesis of right dominant side as late effect of cerebrovascular disease, unspecified cerebrovascular disease type [facility])   [facility] [place], Devonne Doughty, DO   2 months ago Type 2 diabetes mellitus with other specified complication, without long-term current use of insulin [facility])   [facility] Delles, Grayland Ormond A, RPH-CPP   2 months ago History of cerebrovascular accident (CVA) with residual deficit   [place], DO   3 months ago Oral thrush   [facility] [place], [place] W, NP   3 months ago Type 2 diabetes mellitus with other specified complication, without long-term current use of insulin [facility])   [facility] Delles, Grayland Ormond A, RPH-CPP

## 2022-03-19 DIAGNOSIS — J449 Chronic obstructive pulmonary disease, unspecified: Secondary | ICD-10-CM | POA: Diagnosis not present

## 2022-03-19 DIAGNOSIS — F17210 Nicotine dependence, cigarettes, uncomplicated: Secondary | ICD-10-CM | POA: Diagnosis not present

## 2022-03-19 DIAGNOSIS — R531 Weakness: Secondary | ICD-10-CM | POA: Diagnosis not present

## 2022-03-19 DIAGNOSIS — R0602 Shortness of breath: Secondary | ICD-10-CM | POA: Diagnosis not present

## 2022-03-19 DIAGNOSIS — J441 Chronic obstructive pulmonary disease with (acute) exacerbation: Secondary | ICD-10-CM | POA: Diagnosis not present

## 2022-03-19 DIAGNOSIS — Z20822 Contact with and (suspected) exposure to covid-19: Secondary | ICD-10-CM | POA: Diagnosis not present

## 2022-03-27 DIAGNOSIS — R54 Age-related physical debility: Secondary | ICD-10-CM | POA: Diagnosis not present

## 2022-03-27 DIAGNOSIS — Z993 Dependence on wheelchair: Secondary | ICD-10-CM | POA: Diagnosis not present

## 2022-03-27 DIAGNOSIS — M545 Low back pain, unspecified: Secondary | ICD-10-CM | POA: Diagnosis not present

## 2022-03-27 DIAGNOSIS — I639 Cerebral infarction, unspecified: Secondary | ICD-10-CM | POA: Diagnosis not present

## 2022-03-27 DIAGNOSIS — J449 Chronic obstructive pulmonary disease, unspecified: Secondary | ICD-10-CM | POA: Diagnosis not present

## 2022-04-03 ENCOUNTER — Encounter: Payer: Self-pay | Admitting: Family Medicine

## 2022-04-03 DIAGNOSIS — Z794 Long term (current) use of insulin: Secondary | ICD-10-CM

## 2022-04-03 DIAGNOSIS — G894 Chronic pain syndrome: Secondary | ICD-10-CM

## 2022-04-04 MED ORDER — MOUNJARO 5 MG/0.5ML ~~LOC~~ SOAJ: 5.0000 mg | SUBCUTANEOUS | 2 refills | Status: DC

## 2022-04-04 MED ORDER — OXYCODONE-ACETAMINOPHEN 10-325 MG PO TABS: 1.0000 | ORAL_TABLET | Freq: Three times a day (TID) | ORAL | 0 refills | Status: DC | PRN

## 2022-04-16 ENCOUNTER — Ambulatory Visit: Payer: 59 | Admitting: Family Medicine

## 2022-04-18 ENCOUNTER — Ambulatory Visit: Payer: 59 | Admitting: Family Medicine

## 2022-04-18 ENCOUNTER — Encounter: Payer: Self-pay | Admitting: Family Medicine

## 2022-04-21 ENCOUNTER — Telehealth (INDEPENDENT_AMBULATORY_CARE_PROVIDER_SITE_OTHER): Payer: 59 | Admitting: Family Medicine

## 2022-04-21 DIAGNOSIS — N3000 Acute cystitis without hematuria: Secondary | ICD-10-CM | POA: Diagnosis not present

## 2022-04-21 DIAGNOSIS — B379 Candidiasis, unspecified: Secondary | ICD-10-CM | POA: Diagnosis not present

## 2022-04-21 DIAGNOSIS — R300 Dysuria: Secondary | ICD-10-CM

## 2022-04-21 LAB — POCT URINALYSIS DIPSTICK
Bilirubin, UA: NEGATIVE
Blood, UA: NEGATIVE
Glucose, UA: NEGATIVE
Ketones, UA: NEGATIVE
Nitrite, UA: NEGATIVE
Protein, UA: POSITIVE — AB
Spec Grav, UA: 1.015 (ref 1.010–1.025)
Urobilinogen, UA: 0.2 E.U./dL
pH, UA: 5 (ref 5.0–8.0)

## 2022-04-21 MED ORDER — SULFAMETHOXAZOLE-TRIMETHOPRIM 800-160 MG PO TABS
1.0000 | ORAL_TABLET | Freq: Two times a day (BID) | ORAL | 0 refills | Status: AC
Start: 2022-04-21 — End: 2022-04-28

## 2022-04-21 MED ORDER — FLUCONAZOLE 150 MG PO TABS
ORAL_TABLET | ORAL | 0 refills | Status: DC
Start: 2022-04-21 — End: 2022-05-06

## 2022-04-21 NOTE — Progress Notes (Addendum)
Subjective:    Patient ID: [name], female    DOB: Jul 03, 1965, 57 y.o.   MRN: [id number]  [name] is a 57 y.o. female presenting on 04/21/2022 for Urinary Tract Infection (For 4 days)  Virtual / Telehealth Encounter - Video Visit via MyChart The purpose of this virtual visit is to provide medical care while limiting exposure to the novel coronavirus (COVID19) for both patient and office staff.  Consent was obtained for remote visit:  Yes.   Answered questions that patient had about telehealth interaction:  Yes.   I discussed the limitations, risks, security and privacy concerns of performing an evaluation and management service by video/telephone. I also discussed with the patient that there may be a patient responsible charge related to this service. The patient expressed understanding and agreed to proceed.  Patient Location: Home Provider Location: [facility] (Office)  Participants in virtual visit: - Patient: [name] - CMA: Darrol Angel, CMA - Provider: Dr Althea Charon   HPI  UTI Reports symptoms started past few days. She has dysuria and burning pain urinary frequency. Has had prior UTI before. Last 12/2021, has had some resistance on last urine culture. She has allergy to Augmentin. She has taken Cipro and Bactrim before. Not endorsing blood in urine Urine sample dropped off today earlier. Denies any fever chills sweats flank pain     05/15/2021    1:59 PM 04/17/2021    2:35 PM 03/04/2021    2:30 PM  Depression screen PHQ 2/9  Decreased Interest 2 2 1   Down, Depressed, Hopeless 0 0 1  PHQ - 2 Score 2 2 2   Altered sleeping 3 3 2   Tired, decreased energy 3 3 3   Change in appetite 3 3 2   Feeling bad or failure about yourself  0 0 0  Trouble concentrating 2 2 1   Moving slowly or fidgety/restless 2 2 0  Suicidal thoughts 0 0 0  PHQ-9 Score 15 15 10   Difficult doing work/chores Very difficult Very difficult Not difficult at all     Social History   Tobacco Use   Smoking status: Every Day    Packs/day: 1.00    Years: 20.00    Additional pack years: 0.00    Total pack years: 20.00    Types: Cigarettes   Smokeless tobacco: Never  Vaping Use   Vaping Use: Former  Substance Use Topics   Alcohol use: No   Drug use: Yes    Types: Marijuana    Comment: last smoked 2 days ago  8/4    Review of Systems Per HPI unless specifically indicated above     Objective:    There were no vitals taken for this visit.  Wt Readings from Last 3 Encounters:  12/18/21 239 lb (108.4 kg)  12/11/21 239 lb 6.7 oz (108.6 kg)  11/20/21 235 lb (106.6 kg)    Physical Exam  Note examination was completely remotely via video observation objective data only  Gen - well-appearing, no acute distress or apparent pain, comfortable HEENT - eyes appear clear without discharge or redness Heart/Lungs - cannot examine virtually - observed no evidence of coughing or labored breathing. Abd - cannot examine virtually  Skin - face visible today- no rash Neuro - awake, alert, oriented Psych - not anxious appearing   Results for orders placed or performed in visit on 04/21/22  POCT Urinalysis Dipstick  Result Value Ref Range   Color, UA Yellow    Clarity,  UA Cloudy    Glucose, UA Negative Negative   Bilirubin, UA Neg    Ketones, UA Neg    Spec Grav, UA 1.015 1.010 - 1.025   Blood, UA Neg    pH, UA 5.0 5.0 - 8.0   Protein, UA Positive (A) Negative   Urobilinogen, UA 0.2 0.2 or 1.0 E.U./dL   Nitrite, UA Neg    Leukocytes, UA Moderate (2+) (A) Negative   Appearance Cloudy    Odor Postive       Assessment & Plan:   Problem List Items Addressed This Visit   None Visit Diagnoses     Acute cystitis without hematuria    -  Primary   Relevant Medications   sulfamethoxazole-trimethoprim (BACTRIM DS) 800-160 MG tablet   Other Relevant Orders   Urine Culture   Dysuria       Relevant Orders   POCT Urinalysis Dipstick  (Completed)   Antibiotic-induced yeast infection       Relevant Medications   sulfamethoxazole-trimethoprim (BACTRIM DS) 800-160 MG tablet   fluconazole (DIFLUCAN) 150 MG tablet       History consistent with UTI and confirmed on UA  Known DM2 at risk of UTI. No concern for pyelo today (no systemic symptoms, neg fever, back pain, n/v).  Plan: 1. UA / micro - neg nitrite, pos mod leuks, RBC neg 2. Ordered Urine culture 3. Bactrim TWICE A DAY 3-7 days, as discussed if 100% resolved-3-5 days can stop, or finish 7 day - Add Diflucan for anti biotic yeast infection 4. Improve PO hydration  Follow up if not resolved  Already has upcoming apt  Orders Placed This Encounter  Procedures   Urine Culture   POCT Urinalysis Dipstick      Meds ordered this encounter  Medications   sulfamethoxazole-trimethoprim (BACTRIM DS) 800-160 MG tablet    Sig: Take 1 tablet by mouth 2 (two) times daily for 7 days.    Dispense:  14 tablet    Refill:  0   fluconazole (DIFLUCAN) 150 MG tablet    Sig: Take one tablet by mouth on Day 1. Repeat dose 2nd tablet on Day 3.    Dispense:  2 tablet    Refill:  0      Follow up plan: No follow-ups on file.  Patient verbalizes understanding with the above medical recommendations including the limitation of remote medical advice.  Specific follow-up and call-back criteria were given for patient to follow-up or seek medical care more urgently if needed.  Total duration of direct patient care provided via video conference: 6 minutes   Saralyn Pilar, DO [facility] 04/21/2022, 3:20 PM

## 2022-04-23 LAB — URINE CULTURE
MICRO NUMBER:: 14796282
SPECIMEN QUALITY:: ADEQUATE

## 2022-04-27 DIAGNOSIS — J449 Chronic obstructive pulmonary disease, unspecified: Secondary | ICD-10-CM | POA: Diagnosis not present

## 2022-04-27 DIAGNOSIS — Z993 Dependence on wheelchair: Secondary | ICD-10-CM | POA: Diagnosis not present

## 2022-04-27 DIAGNOSIS — R54 Age-related physical debility: Secondary | ICD-10-CM | POA: Diagnosis not present

## 2022-04-27 DIAGNOSIS — M545 Low back pain, unspecified: Secondary | ICD-10-CM | POA: Diagnosis not present

## 2022-04-27 DIAGNOSIS — I639 Cerebral infarction, unspecified: Secondary | ICD-10-CM | POA: Diagnosis not present

## 2022-04-28 ENCOUNTER — Ambulatory Visit: Payer: 59 | Admitting: Family Medicine

## 2022-04-29 ENCOUNTER — Other Ambulatory Visit: Payer: Self-pay | Admitting: Family Medicine

## 2022-04-29 DIAGNOSIS — J432 Centrilobular emphysema: Secondary | ICD-10-CM

## 2022-04-30 ENCOUNTER — Telehealth: Payer: Self-pay | Admitting: Family Medicine

## 2022-04-30 NOTE — Telephone Encounter (Signed)
Called patient to schedule Medicare Annual Wellness Visit (AWV). Left message for patient to call back and schedule Medicare Annual Wellness Visit (AWV).  Last date of AWV: NONE  Please schedule an appointment at any time with Lorrie Barnes, LPN .  If any questions, please contact me.  Thank you ,  Tricha Ruggirello Harris-Coley; Care Guide Ambulatory Clinical Support [place] [facility] Direct Dial: [phone]   

## 2022-04-30 NOTE — Telephone Encounter (Signed)
Contacted [name] to schedule their annual wellness visit. Appointment made for 05/03/204.  Verlee Rossetti; Care Guide Ambulatory Clinical Support [place] [facility] Direct Dial: [phone]

## 2022-04-30 NOTE — Telephone Encounter (Signed)
Requested Prescriptions  Pending Prescriptions Disp Refills   albuterol (VENTOLIN HFA) 108 (90 Base) MCG/ACT inhaler [Pharmacy Med Name: ALBUTEROL HFA 90MCG/ACT (PV)] 40.2 g 2    Sig: USE 2 INHALATIONS BY MOUTH EVERY 4 HOURS AS NEEDED FOR WHEEZING  OR SHORTNESS OF BREATH     Pulmonology:  Beta Agonists 2 Passed - 04/29/2022  2:34 AM      Passed - Last BP in normal range    BP Readings from Last 1 Encounters:  12/14/21 121/68         Passed - Last Heart Rate in normal range    Pulse Readings from Last 1 Encounters:  12/14/21 81         Passed - Valid encounter within last 12 months    Recent Outpatient Visits           1 week ago Acute cystitis without hematuria   [facility] [place], Netta Neat, DO   3 months ago Hemiparesis of right dominant side as late effect of cerebrovascular disease, unspecified cerebrovascular disease type [facility])   [facility] [place], Netta Neat, DO   4 months ago Type 2 diabetes mellitus with other specified complication, without long-term current use of insulin [facility])   [facility] D[place], Gentry Fitz A, RPH-CPP   4 months ago History of cerebrovascular accident (CVA) with residual deficit   [place] [place], Netta Neat, DO   5 months ago Oral thrush   [facility] [place], [place], [place]

## 2022-05-06 ENCOUNTER — Ambulatory Visit: Admission: RE | Admit: 2022-05-06 | Payer: 59 | Source: Home / Self Care

## 2022-05-06 ENCOUNTER — Encounter: Payer: Self-pay | Admitting: Family Medicine

## 2022-05-06 ENCOUNTER — Ambulatory Visit
Admission: RE | Admit: 2022-05-06 | Discharge: 2022-05-06 | Disposition: A | Discharge status: Home / Self Care | Payer: 59 | Source: Ambulatory Visit | Attending: Family Medicine | Admitting: Family Medicine

## 2022-05-06 ENCOUNTER — Ambulatory Visit (INDEPENDENT_AMBULATORY_CARE_PROVIDER_SITE_OTHER): Payer: 59 | Admitting: Family Medicine

## 2022-05-06 VITALS — BP 98/62 | HR 92 | Ht 66.0 in | Wt 244.0 lb

## 2022-05-06 DIAGNOSIS — M542 Cervicalgia: Secondary | ICD-10-CM | POA: Diagnosis not present

## 2022-05-06 DIAGNOSIS — M545 Low back pain, unspecified: Secondary | ICD-10-CM | POA: Diagnosis not present

## 2022-05-06 DIAGNOSIS — E1169 Type 2 diabetes mellitus with other specified complication: Secondary | ICD-10-CM | POA: Diagnosis not present

## 2022-05-06 DIAGNOSIS — N3944 Nocturnal enuresis: Secondary | ICD-10-CM

## 2022-05-06 DIAGNOSIS — M25551 Pain in right hip: Secondary | ICD-10-CM

## 2022-05-06 DIAGNOSIS — G894 Chronic pain syndrome: Secondary | ICD-10-CM | POA: Diagnosis not present

## 2022-05-06 DIAGNOSIS — G8929 Other chronic pain: Secondary | ICD-10-CM | POA: Insufficient documentation

## 2022-05-06 DIAGNOSIS — J432 Centrilobular emphysema: Secondary | ICD-10-CM | POA: Diagnosis not present

## 2022-05-06 DIAGNOSIS — F5104 Psychophysiologic insomnia: Secondary | ICD-10-CM

## 2022-05-06 DIAGNOSIS — I69951 Hemiplegia and hemiparesis following unspecified cerebrovascular disease affecting right dominant side: Secondary | ICD-10-CM | POA: Diagnosis not present

## 2022-05-06 DIAGNOSIS — R531 Weakness: Secondary | ICD-10-CM | POA: Diagnosis not present

## 2022-05-06 DIAGNOSIS — R32 Unspecified urinary incontinence: Secondary | ICD-10-CM

## 2022-05-06 DIAGNOSIS — Z794 Long term (current) use of insulin: Secondary | ICD-10-CM

## 2022-05-06 LAB — POCT GLYCOSYLATED HEMOGLOBIN (HGB A1C): Hemoglobin A1C: 5.2 % (ref 4.0–5.6)

## 2022-05-06 MED ORDER — OXYCODONE-ACETAMINOPHEN 10-325 MG PO TABS
1.0000 | ORAL_TABLET | Freq: Three times a day (TID) | ORAL | 0 refills | Status: DC | PRN
Start: 2022-05-06 — End: 2023-04-04

## 2022-05-06 MED ORDER — METHOCARBAMOL 500 MG PO TABS: 500.0000 mg | ORAL_TABLET | Freq: Three times a day (TID) | ORAL | 5 refills | Status: DC

## 2022-05-06 NOTE — Progress Notes (Signed)
Subjective:    Patient ID: [name], female    DOB: 01/12/1966, 58 y.o.   MRN: [id number]  [name] is a 56 y.o. female presenting on 05/06/2022 for Medical Management of Chronic Issues   HPI  Urinary Incontinence / Nocturnal Enuresis Requesting referral to Urology She has history of recurrent UTI Reports problem chronic issue since childhood with urinary incontinence overnight. Has not been diagnosed or treated before. She wears diapers and uses pads. She admits her urinary habits are fairly normal during the day. Last visit 04/21/22 - UTI, has resolved w/ treatment.  Major Depression Recurrent, moderate Insomnia Takes Seroquel  daily at bedtime She admits pain keeping her awake, so the seroquel is less effective now. On Wellbutrin XL  daily in AM On Citalopram  daily  Type 2 Diabetes Due for A1c today Last checked 4 mo ago 6.7, has improved on GLP GIP therapy On Mounjaro  weekly inj, but has been out of stock lately. Initial weight 250 lbs Weight loss down to 244 lbs now  Chronic Pain Syndrome Osteoarthritis Lumbar Cervical DDD Back Pain / Neck Pain  Scheduled w/ [facility]) 05/28/22  Previously on Oxycodone-Acetaminophen 10-325mg  taking 2-3 times per day, improved sleep when she takes in PM. On Methocarbamol  THREE TIMES A DAY with good results, needs re order. On Gabapentin has refills.  Back Pain / Chronic Right Hip Pain Recent flare up with worsening R hip pain and mobility limited due to this. Regarding back pain - Grandson had to help pick her up and put her in bed. She said it popped directly over her spinal fusion. And it caused a "pop" and has been aggravated and bothering her.  Worked with Physical Therapy previously at [facility], she was unable to proceed due to Right Hip Pain, limiting her progress. Needed to get it checked out.  In future if needs MRI she would need open MRI in  [place].  Previously seen - [facility] for [facility] and hip  Previous neurosurgery spine - Dr [facility] [place]  She is also requesting setup Home Health PT >1 per week?? Prefer 1-2 hours, 2 times or more.       05/06/2022    2:43 PM 05/15/2021    1:59 PM 04/17/2021    2:35 PM  Depression screen PHQ 2/9  Decreased Interest Down, Depressed, Hopeless 0 0 0  PHQ - 2 Score Altered sleeping Tired, decreased energy Change in appetite Feeling bad or failure about yourself  0 0 0  Trouble concentrating Moving slowly or fidgety/restless Suicidal thoughts 0 0 0  PHQ-9 Score Difficult doing work/chores Somewhat difficult Very difficult Very difficult      05/06/2022    2:44 PM 05/15/2021    1:59 PM 04/17/2021    2:35 PM 01/18/2021    1:11 PM  GAD 7 : Generalized Anxiety Score  Nervous, Anxious, on Edge 0  Control/stop worrying 1 0 0 0  Worry too much - different things 0  Trouble relaxing Restless Easily annoyed or irritable Afraid - awful might happen 0 0 0 0  Total GAD 7 Score 7  13 12   Anxiety Difficulty Not difficult at all Somewhat difficult Not difficult at all Not difficult at all      Social History   Tobacco Use   Smoking status: Every Day    Packs/day: 1.00    Years: 20.00    Additional pack years: 0.00    Total pack years: 20.00    Types: Cigarettes   Smokeless tobacco: Never  Vaping Use   Vaping Use: Former  Substance Use Topics   Alcohol use: No   Drug use: Yes    Types: Marijuana    Comment: last smoked 2 days ago  8/4    Review of Systems Per HPI unless specifically indicated above     Objective:    BP 98/62   Pulse 92   Ht  (1.676 m)   Wt 244 lb (110.7 kg) Comment: unable to stand  BMI 39.38 kg/m   Wt Readings from Last 3 Encounters:  05/06/22 244 lb (110.7 kg)  12/18/21 239 lb (108.4 kg)  12/11/21  239 lb 6.7 oz (108.6 kg)    Physical Exam  I have personally reviewed the radiology report from 07/14/18 on MRI Lumbar.  CLINICAL DATA:  Initial evaluation for low back pain with right buttock/leg pain for 8 months. Remote history of previous surgery in 2010.   EXAM: MRI LUMBAR SPINE WITHOUT AND WITH CONTRAST   TECHNIQUE: Multiplanar and multiecho pulse sequences of the lumbar spine were obtained without and with intravenous contrast.   CONTRAST:  20mL MULTIHANCE GADOBENATE DIMEGLUMINE 529 MG/ML IV SOLN   COMPARISON:  Previous MRI from 01/28/2016.   FINDINGS: Segmentation: Standard. Lowest well-formed disc labeled the L5-S1 level.   Alignment: Trace 2 mm retrolisthesis of L3 on L4. Alignment otherwise physiologic with preservation of the normal lumbar lordosis.   Vertebrae: Susceptibility artifact from prior PLIF at L4-5. Vertebral body height maintained without evidence for acute or chronic fracture. Bone marrow signal intensity within normal limits. No discrete or worrisome osseous lesions. Reactive endplate changes noted about the T10-11 interspace, partially visualized. No other abnormal marrow edema or enhancement.   Conus medullaris and cauda equina: Conus extends to the L1 level. Conus and cauda equina appear normal.   Paraspinal and other soft tissues: Paraspinous soft tissues within normal limits. Visualized visceral structures within normal limits.   Disc levels:   Mild degenerative disc bulging noted at T10-11 and T11-12 without significant stenosis.   L1-2: Negative interspace. Mild bilateral facet hypertrophy. No canal or foraminal stenosis.   L2-3: Mild annular disc bulge. Mild facet and ligament flavum hypertrophy. No significant canal or foraminal stenosis.   L3-4: Diffuse disc bulge with disc desiccation. Disc bulging slightly asymmetric to the left with superimposed tiny central disc protrusion (series 5, image 18). Mild facet and ligament  flavum hypertrophy. Resultant mild canal with left lateral recess narrowing, stable from previous. Foramina remain patent. No impingement.   L4-5:  Prior PLIF.  No residual canal or foraminal stenosis.   L5-S1: Disc desiccation with mild intervertebral disc space narrowing. Superimposed broad-based left central disc protrusion with slight cephalad angulation. Protruding disc closely approximates the descending S1 nerve roots bilaterally, left slightly greater than right. No frank impingement. Mild bilateral facet hypertrophy. Superimposed mild epidural lipomatosis with compression of the distal thecal sac. Foramina remain patent. Appearance is relatively unchanged from previous.   IMPRESSION: 1. Overall no significant interval change in appearance of the lumbar spine as compared to 01/28/2016. 2. Small left central disc protrusion  at L5-S1, closely approximating the descending S1 nerve roots without frank neural impingement, left slightly worse than right. 3. Disc bulging with superimposed small central disc protrusion and facet hypertrophy at L3-4 with resultant mild canal and left lateral recess stenosis. No impingement. 4. Sequelae of prior PLIF at L4-5 without residual or recurrent stenosis.     Electronically Signed   By: Rise Mu M.D.   On: 07/14/2018 23:07  Results for orders placed or performed in visit on 05/06/22  POCT glycosylated hemoglobin (Hb A1C)  Result Value Ref Range   Hemoglobin A1C 5.2 4.0 - 5.6 %   HbA1c POC (<> result, manual entry)     HbA1c, POC (prediabetic range)     HbA1c, POC (controlled diabetic range)        Assessment & Plan:   Problem List Items Addressed This Visit     Centrilobular emphysema   Chronic low back pain without sciatica   Relevant Medications   oxyCODONE-acetaminophen (PERCOCET) 10-325 MG tablet   methocarbamol (ROBAXIN) 500 MG tablet   Other Relevant Orders   DG Lumbar Spine Complete   Ambulatory referral  to Home Health   Chronic pain syndrome - Primary   Relevant Medications   oxyCODONE-acetaminophen (PERCOCET) 10-325 MG tablet   methocarbamol (ROBAXIN) 500 MG tablet   Chronic right hip pain   Relevant Medications   oxyCODONE-acetaminophen (PERCOCET) 10-325 MG tablet   methocarbamol (ROBAXIN) 500 MG tablet   Other Relevant Orders   DG HIP UNILAT W OR W/O PELVIS 2-3 VIEWS RIGHT   Ambulatory referral to Home Health   Generalized weakness   Relevant Orders   Ambulatory referral to Home Health   Hemiparesis of right dominant side as late effect of cerebrovascular disease   Relevant Orders   Ambulatory referral to Home Health   Type 2 diabetes mellitus with other specified complication   Relevant Orders   POCT glycosylated hemoglobin (Hb A1C) (Completed)   Urinary incontinence   Relevant Orders   Ambulatory referral to Urology   Ambulatory referral to Urology   Other Visit Diagnoses     Psychophysiological insomnia       Chronic neck pain       Relevant Medications   oxyCODONE-acetaminophen (PERCOCET) 10-325 MG tablet   methocarbamol (ROBAXIN) 500 MG tablet       Chronic Pain Syndrome Low Back Pain, chronic Right Hip Pain  History of CVA with residual deficit RLE / Generalized Weakness Wheelchair bound, can transfer chair to bed but she is non ambulatory  X-rays today Lumbar spine and Right Hip  Pending results   Lumbar > We can refer to Dr Lovell Sheehan at [facility] (especially given her concern with hardware)  Right Hip > We can refer to [facility] once review imaging  Regarding pain management As discussed, I am only able to temporarily manage her pain medication until she can see Pain Management, apt 05/28/22. Renew rx Oxycodone-Acetaminophen, add extra pill per day for sleep and for PT upcoming  Re order Methocarbamol muscle relaxant.  Referral to Home Health PT. She is non ambulatory. Can transfer chair to bed only. Mostly in wheelchair. Unable to use  walker or other devices. Not candidate for outpatient therapy. Will refer to [facility] PT. Goal for more intensive therapy >1 hour for 2-3 times per week   Type 2 DM Dramatic improved A1c to 5.2, w/ weight loss, below diabetic range. Successful weight loss on Mounjaro Keep on Mounjaro 5mg  weekly, if difficult to obtain can  consider 7.5 vs 10mg  dosage  Chronic Urinary Incontinence / Nocturnal Enuresis Seems some mixed symptoms but mostly nocturnal Also chronic recurrent UTI seems to be associated issue. Not on medication. She has not been managed for incontinence previously Referral to Urologist for urinary incontinence.  [facility] [address] [place],  [place]  [address] Phone: [phone]     Orders Placed This Encounter  Procedures   DG HIP UNILAT W OR W/O PELVIS 2-3 VIEWS RIGHT    Standing Status:   Future    Number of Occurrences:   1    Standing Expiration Date:   08/05/2022    Order Specific Question:   Reason for Exam (SYMPTOM  OR DIAGNOSIS REQUIRED)    Answer:   chronic pain, worsening, unable to stand    Order Specific Question:   Is patient pregnant?    Answer:   No    Order Specific Question:   Preferred imaging location?    Answer:   [facility]    Order Specific Question:   Radiology Contrast Protocol - do NOT remove file path    Answer:   [email or web address]   DG Lumbar Spine Complete    Standing Status:   Future    Number of Occurrences:   1    Standing Expiration Date:   05/06/2023    Order Specific Question:   Reason for Exam (SYMPTOM  OR DIAGNOSIS REQUIRED)    Answer:   chronic low back pain, prior lumbar spinal fusion with new injury from lifting, question hardware    Order Specific Question:   Is patient pregnant?    Answer:   No    Order Specific Question:   Preferred imaging location?    Answer:   [facility]   Ambulatory referral to  Urology    Referral Priority:   Routine    Referral Type:   Consultation    Referral Reason:   Specialty Services Required    Requested Specialty:   Urology    Number of Visits Requested:   1   Ambulatory referral to Home Health    Referral Priority:   Routine    Referral Type:   Home Health Care    Referral Reason:   Specialty Services Required    Requested Specialty:   Home Health Services    Number of Visits Requested:   1   POCT glycosylated hemoglobin (Hb A1C)     Meds ordered this encounter  Medications   oxyCODONE-acetaminophen (PERCOCET) 10-325 MG tablet    Sig: Take 1 tablet by mouth every 8 (eight) hours as needed for pain.    Dispense:  90 tablet    Refill:  0   methocarbamol (ROBAXIN) 500 MG tablet    Sig: Take 1 tablet (500 mg total) by mouth 3 (three) times daily.    Dispense:  90 tablet    Refill:  5      Follow up plan: Return in about 3 months (around 08/05/2022) for 3 month follow-up DM, Pain, Spine/Pain Updates.   Saralyn Pilar, DO [facility] 05/06/2022, 5:47 PM

## 2022-05-06 NOTE — Patient Instructions (Addendum)
Thank you for coming to the office today.  X-rays today Lumbar spine and Right Hip  Stay tuned for results.  We can [facility]  Also [facility]  ------------  Re ordered Oxycodone-Acetaminophen for until you can see [facility] for Pain Management  Re order Methocarbamol muscle relaxant.  You have Gabapentin refills.  Weight down on Mounjaro  A1c today, check mychart later.  I will contact the team about Home Health PT. We will ask if we can get this arranged.  Referral to Urologist for urinary incontinence.  [facility] [address] [place],  [place]  [address] Phone: [phone]  Please schedule a Follow-up Appointment to: Return in about 3 months (around 08/05/2022) for 3 month follow-up DM, Pain, Spine/Pain Updates.  If you have any other questions or concerns, please feel free to call the office or send a message through MyChart. You may also schedule an earlier appointment if necessary.  Additionally, you may be receiving a survey about your experience at our office within a few days to 1 week by e-mail or mail. We value your feedback.  Saralyn Pilar, DO [facility], [place]

## 2022-05-07 ENCOUNTER — Telehealth: Payer: Self-pay | Admitting: Family Medicine

## 2022-05-07 ENCOUNTER — Other Ambulatory Visit: Payer: Self-pay

## 2022-05-07 MED ORDER — MOUNJARO 10 MG/0.5ML ~~LOC~~ SOAJ
10.0000 mg | SUBCUTANEOUS | 2 refills | Status: DC
Start: 2022-05-07 — End: 2022-05-29
  Filled 2022-05-07 – 2022-05-08 (×2): qty 2, 28d supply, fill #0

## 2022-05-07 NOTE — Telephone Encounter (Signed)
This is a duplicate message... See My Chart message.   Thanks,   -Vernona Rieger

## 2022-05-07 NOTE — Telephone Encounter (Signed)
Pt states that [facility] pharmacy has a few boxes of Mounjaro .  Please send to pharmacy.   Thanks,   -Vernona Rieger

## 2022-05-07 NOTE — Telephone Encounter (Addendum)
Pt stated she has an urgent message for Dr. Kirtland Bouchard stated she found some MOUNJARO at [facility]. Stated they have a few boxes left of  .  Please advise.

## 2022-05-08 ENCOUNTER — Telehealth: Payer: Self-pay | Admitting: Family Medicine

## 2022-05-08 ENCOUNTER — Other Ambulatory Visit: Payer: Self-pay

## 2022-05-08 DIAGNOSIS — F331 Major depressive disorder, recurrent, moderate: Secondary | ICD-10-CM

## 2022-05-08 DIAGNOSIS — B372 Candidiasis of skin and nail: Secondary | ICD-10-CM

## 2022-05-08 DIAGNOSIS — G894 Chronic pain syndrome: Secondary | ICD-10-CM

## 2022-05-08 DIAGNOSIS — J432 Centrilobular emphysema: Secondary | ICD-10-CM

## 2022-05-08 DIAGNOSIS — Z794 Long term (current) use of insulin: Secondary | ICD-10-CM

## 2022-05-08 DIAGNOSIS — I6930 Unspecified sequelae of cerebral infarction: Secondary | ICD-10-CM

## 2022-05-08 DIAGNOSIS — E782 Mixed hyperlipidemia: Secondary | ICD-10-CM

## 2022-05-08 DIAGNOSIS — K219 Gastro-esophageal reflux disease without esophagitis: Secondary | ICD-10-CM

## 2022-05-08 DIAGNOSIS — G43711 Chronic migraine without aura, intractable, with status migrainosus: Secondary | ICD-10-CM

## 2022-05-08 MED ORDER — CLOPIDOGREL BISULFATE 75 MG PO TABS
75.0000 mg | ORAL_TABLET | Freq: Every day | ORAL | 1 refills | Status: DC
Start: 2022-05-08 — End: 2022-06-27

## 2022-05-08 MED ORDER — TOPIRAMATE 100 MG PO TABS
100.0000 mg | ORAL_TABLET | Freq: Two times a day (BID) | ORAL | 1 refills | Status: DC
Start: 2022-05-08 — End: 2022-10-07

## 2022-05-08 MED ORDER — BUPROPION HCL ER (XL) 150 MG PO TB24
150.0000 mg | ORAL_TABLET | Freq: Every day | ORAL | 1 refills | Status: DC
Start: 2022-05-08 — End: 2022-09-22

## 2022-05-08 MED ORDER — ATORVASTATIN CALCIUM 40 MG PO TABS
40.0000 mg | ORAL_TABLET | Freq: Every day | ORAL | 1 refills | Status: DC
Start: 2022-05-08 — End: 2022-05-15

## 2022-05-08 MED ORDER — GABAPENTIN 600 MG PO TABS
ORAL_TABLET | ORAL | 1 refills | Status: DC
Start: 2022-05-08 — End: 2022-09-23

## 2022-05-08 MED ORDER — ALBUTEROL SULFATE HFA 108 (90 BASE) MCG/ACT IN AERS
INHALATION_SPRAY | RESPIRATORY_TRACT | 2 refills | Status: DC
Start: 2022-05-08 — End: 2022-12-30

## 2022-05-08 MED ORDER — MOUNJARO 10 MG/0.5ML ~~LOC~~ SOAJ
10.0000 mg | SUBCUTANEOUS | 2 refills | Status: DC
Start: 2022-05-08 — End: 2022-05-16

## 2022-05-08 MED ORDER — NYSTATIN 100000 UNIT/GM EX POWD
1.0000 | Freq: Three times a day (TID) | CUTANEOUS | 2 refills | Status: DC | PRN
Start: 2022-05-08 — End: 2022-06-27

## 2022-05-08 MED ORDER — PANTOPRAZOLE SODIUM 40 MG PO TBEC
40.0000 mg | DELAYED_RELEASE_TABLET | Freq: Every day | ORAL | 1 refills | Status: DC
Start: 2022-05-08 — End: 2022-09-16

## 2022-05-08 NOTE — Telephone Encounter (Signed)
That will work. Thank you  Saralyn Pilar, DO [facility] 05/08/2022, 2:31 PM

## 2022-05-08 NOTE — Telephone Encounter (Signed)
Home Health Verbal Orders - Caller/Agency: [name]/[facility] Callback Number:  [phone] option 2   [name] stated received a referral from Dr. Kirtland Bouchard for PT. Attempted to schedule for tomorrow, but pt has doctors appointments. Will be seeing pt this upcoming Monday, if okay with Dr. Kirtland Bouchard.  Please advise.

## 2022-05-08 NOTE — Telephone Encounter (Signed)
[name] advised.   Thanks,   -Indiya Izquierdo  

## 2022-05-09 DIAGNOSIS — M129 Arthropathy, unspecified: Secondary | ICD-10-CM | POA: Diagnosis not present

## 2022-05-09 DIAGNOSIS — E7800 Pure hypercholesterolemia, unspecified: Secondary | ICD-10-CM | POA: Diagnosis not present

## 2022-05-09 DIAGNOSIS — Z131 Encounter for screening for diabetes mellitus: Secondary | ICD-10-CM | POA: Diagnosis not present

## 2022-05-09 DIAGNOSIS — Z0000 Encounter for general adult medical examination without abnormal findings: Secondary | ICD-10-CM | POA: Diagnosis not present

## 2022-05-09 DIAGNOSIS — Z79899 Other long term (current) drug therapy: Secondary | ICD-10-CM | POA: Diagnosis not present

## 2022-05-09 DIAGNOSIS — R5383 Other fatigue: Secondary | ICD-10-CM | POA: Diagnosis not present

## 2022-05-09 DIAGNOSIS — Z1159 Encounter for screening for other viral diseases: Secondary | ICD-10-CM | POA: Diagnosis not present

## 2022-05-09 DIAGNOSIS — E559 Vitamin D deficiency, unspecified: Secondary | ICD-10-CM | POA: Diagnosis not present

## 2022-05-12 ENCOUNTER — Encounter: Payer: Self-pay | Admitting: Family Medicine

## 2022-05-12 ENCOUNTER — Telehealth: Payer: Self-pay | Admitting: Family Medicine

## 2022-05-12 ENCOUNTER — Other Ambulatory Visit: Payer: Self-pay | Admitting: Family Medicine

## 2022-05-12 DIAGNOSIS — E119 Type 2 diabetes mellitus without complications: Secondary | ICD-10-CM | POA: Diagnosis not present

## 2022-05-12 DIAGNOSIS — I6930 Unspecified sequelae of cerebral infarction: Secondary | ICD-10-CM

## 2022-05-12 DIAGNOSIS — I69951 Hemiplegia and hemiparesis following unspecified cerebrovascular disease affecting right dominant side: Secondary | ICD-10-CM

## 2022-05-12 DIAGNOSIS — J449 Chronic obstructive pulmonary disease, unspecified: Secondary | ICD-10-CM | POA: Diagnosis not present

## 2022-05-12 DIAGNOSIS — J9611 Chronic respiratory failure with hypoxia: Secondary | ICD-10-CM | POA: Diagnosis not present

## 2022-05-12 DIAGNOSIS — G43711 Chronic migraine without aura, intractable, with status migrainosus: Secondary | ICD-10-CM | POA: Diagnosis not present

## 2022-05-12 DIAGNOSIS — G894 Chronic pain syndrome: Secondary | ICD-10-CM | POA: Diagnosis not present

## 2022-05-12 DIAGNOSIS — F17210 Nicotine dependence, cigarettes, uncomplicated: Secondary | ICD-10-CM | POA: Diagnosis not present

## 2022-05-12 DIAGNOSIS — E782 Mixed hyperlipidemia: Secondary | ICD-10-CM

## 2022-05-12 DIAGNOSIS — M545 Low back pain, unspecified: Secondary | ICD-10-CM

## 2022-05-12 DIAGNOSIS — R531 Weakness: Secondary | ICD-10-CM

## 2022-05-12 DIAGNOSIS — S32010A Wedge compression fracture of first lumbar vertebra, initial encounter for closed fracture: Secondary | ICD-10-CM

## 2022-05-12 DIAGNOSIS — M25551 Pain in right hip: Secondary | ICD-10-CM | POA: Diagnosis not present

## 2022-05-12 DIAGNOSIS — Z7984 Long term (current) use of oral hypoglycemic drugs: Secondary | ICD-10-CM | POA: Diagnosis not present

## 2022-05-12 DIAGNOSIS — Z993 Dependence on wheelchair: Secondary | ICD-10-CM | POA: Diagnosis not present

## 2022-05-12 DIAGNOSIS — Z794 Long term (current) use of insulin: Secondary | ICD-10-CM | POA: Diagnosis not present

## 2022-05-12 DIAGNOSIS — E785 Hyperlipidemia, unspecified: Secondary | ICD-10-CM | POA: Diagnosis not present

## 2022-05-12 DIAGNOSIS — M8589 Other specified disorders of bone density and structure, multiple sites: Secondary | ICD-10-CM

## 2022-05-12 DIAGNOSIS — Z87440 Personal history of urinary (tract) infections: Secondary | ICD-10-CM | POA: Diagnosis not present

## 2022-05-12 DIAGNOSIS — E559 Vitamin D deficiency, unspecified: Secondary | ICD-10-CM | POA: Diagnosis not present

## 2022-05-12 DIAGNOSIS — I69351 Hemiplegia and hemiparesis following cerebral infarction affecting right dominant side: Secondary | ICD-10-CM | POA: Diagnosis not present

## 2022-05-12 DIAGNOSIS — J432 Centrilobular emphysema: Secondary | ICD-10-CM | POA: Diagnosis not present

## 2022-05-12 DIAGNOSIS — Z7902 Long term (current) use of antithrombotics/antiplatelets: Secondary | ICD-10-CM | POA: Diagnosis not present

## 2022-05-12 NOTE — Telephone Encounter (Signed)
Home Health Verbal Orders - Caller/Agency: Thayer Ohm with [facility] Callback Number: [phone]  Requesting OT/PT Frequency: OT - Evaluation and for PT - 2 Xs wk for 8 wks  Please assist further

## 2022-05-13 ENCOUNTER — Telehealth: Payer: Self-pay

## 2022-05-13 NOTE — Telephone Encounter (Signed)
Okay to proceed w/ verbal orders  Saralyn Pilar, DO [facility] 05/13/2022, 8:38 AM

## 2022-05-13 NOTE — Telephone Encounter (Signed)
Verbal given to Chris

## 2022-05-13 NOTE — Telephone Encounter (Signed)
Okay, can we switch to other location for Orthopedics that can manage this issue? [facility] will work.  Thank you  Saralyn Pilar, DO [facility] 05/13/2022, 12:05 PM

## 2022-05-13 NOTE — Telephone Encounter (Signed)
Copied from CRM [id number]. Topic: Referral - Question >> May 13, 2022 11:18 AM Haroldine Laws wrote: Reason for CRM: heather with [facility] ortho saying they got a referral for lower back and they do not have anyone who works with compression fractures right now.  CB#  [phone]

## 2022-05-13 NOTE — Telephone Encounter (Signed)
Order faxed.

## 2022-05-14 ENCOUNTER — Ambulatory Visit: Payer: Self-pay | Admitting: *Deleted

## 2022-05-14 ENCOUNTER — Other Ambulatory Visit: Payer: Self-pay | Admitting: Family Medicine

## 2022-05-14 DIAGNOSIS — E785 Hyperlipidemia, unspecified: Secondary | ICD-10-CM | POA: Diagnosis not present

## 2022-05-14 DIAGNOSIS — E559 Vitamin D deficiency, unspecified: Secondary | ICD-10-CM | POA: Diagnosis not present

## 2022-05-14 DIAGNOSIS — Z7902 Long term (current) use of antithrombotics/antiplatelets: Secondary | ICD-10-CM | POA: Diagnosis not present

## 2022-05-14 DIAGNOSIS — F17210 Nicotine dependence, cigarettes, uncomplicated: Secondary | ICD-10-CM | POA: Diagnosis not present

## 2022-05-14 DIAGNOSIS — Z7984 Long term (current) use of oral hypoglycemic drugs: Secondary | ICD-10-CM | POA: Diagnosis not present

## 2022-05-14 DIAGNOSIS — E119 Type 2 diabetes mellitus without complications: Secondary | ICD-10-CM | POA: Diagnosis not present

## 2022-05-14 DIAGNOSIS — Z87440 Personal history of urinary (tract) infections: Secondary | ICD-10-CM | POA: Diagnosis not present

## 2022-05-14 DIAGNOSIS — Z794 Long term (current) use of insulin: Secondary | ICD-10-CM | POA: Diagnosis not present

## 2022-05-14 DIAGNOSIS — M545 Low back pain, unspecified: Secondary | ICD-10-CM | POA: Diagnosis not present

## 2022-05-14 DIAGNOSIS — M25551 Pain in right hip: Secondary | ICD-10-CM | POA: Diagnosis not present

## 2022-05-14 DIAGNOSIS — J9611 Chronic respiratory failure with hypoxia: Secondary | ICD-10-CM | POA: Diagnosis not present

## 2022-05-14 DIAGNOSIS — G43711 Chronic migraine without aura, intractable, with status migrainosus: Secondary | ICD-10-CM | POA: Diagnosis not present

## 2022-05-14 DIAGNOSIS — G894 Chronic pain syndrome: Secondary | ICD-10-CM | POA: Diagnosis not present

## 2022-05-14 DIAGNOSIS — J432 Centrilobular emphysema: Secondary | ICD-10-CM | POA: Diagnosis not present

## 2022-05-14 DIAGNOSIS — Z993 Dependence on wheelchair: Secondary | ICD-10-CM | POA: Diagnosis not present

## 2022-05-14 DIAGNOSIS — E1169 Type 2 diabetes mellitus with other specified complication: Secondary | ICD-10-CM

## 2022-05-14 DIAGNOSIS — J449 Chronic obstructive pulmonary disease, unspecified: Secondary | ICD-10-CM | POA: Diagnosis not present

## 2022-05-14 DIAGNOSIS — I69351 Hemiplegia and hemiparesis following cerebral infarction affecting right dominant side: Secondary | ICD-10-CM | POA: Diagnosis not present

## 2022-05-14 NOTE — Telephone Encounter (Signed)
Called [phone] to review sx regarding request for Diflucan medication . No answer after multiple rings. Unable to leave message. Please advise and refill Diflucan if possible.

## 2022-05-14 NOTE — Telephone Encounter (Signed)
Called patient # [id number] to review request for Diflucan. No answer, LVMTCB [phone].

## 2022-05-14 NOTE — Telephone Encounter (Signed)
Medication Refill - Medication: fluconazole (DIFLUCAN) 150 MG tablet /fluconazole (DIFLUCAN) 150 MG tablet / trilogy 100-62 5/monjauro 10 mg  Has the patient contacted their pharmacy? yes (Agent: If no, request that the patient contact the pharmacy for the refill. If patient does not wish to contact the pharmacy document the reason why and proceed with request.) (Agent: If yes, when and what did the pharmacy advise?)contact pcp  Preferred Pharmacy (with phone number or street name):  [facility] [place] [address] Phone: [phone]  Fax: [phone]     Has the patient been seen for an appointment in the last year OR does the patient have an upcoming appointment? yes  Agent: Please be advised that RX refills may take up to 3 business days. We ask that you follow-up with your pharmacy.

## 2022-05-14 NOTE — Telephone Encounter (Signed)
fluconazole (DIFLUCAN) 150 MG tablet /fluconazole not on current list, routing for approval.

## 2022-05-14 NOTE — Telephone Encounter (Signed)
2nd attempt to contact patient [phone] and no answer, LVMTCB

## 2022-05-15 DIAGNOSIS — M4856XA Collapsed vertebra, not elsewhere classified, lumbar region, initial encounter for fracture: Secondary | ICD-10-CM | POA: Diagnosis not present

## 2022-05-15 MED ORDER — ATORVASTATIN CALCIUM 40 MG PO TABS: 40.0000 mg | ORAL_TABLET | Freq: Every day | ORAL | 3 refills | Status: DC

## 2022-05-15 NOTE — Addendum Note (Signed)
Addended by: Smitty Cords on: 05/15/2022 05:39 PM   Modules accepted: Orders

## 2022-05-15 NOTE — Addendum Note (Signed)
Addended by: Smitty Cords on: 05/15/2022 06:06 PM   Modules accepted: Orders

## 2022-05-16 ENCOUNTER — Ambulatory Visit: Payer: Self-pay | Admitting: *Deleted

## 2022-05-16 ENCOUNTER — Ambulatory Visit (INDEPENDENT_AMBULATORY_CARE_PROVIDER_SITE_OTHER): Payer: 59

## 2022-05-16 VITALS — Ht 66.0 in | Wt 244.0 lb

## 2022-05-16 DIAGNOSIS — Z993 Dependence on wheelchair: Secondary | ICD-10-CM | POA: Diagnosis not present

## 2022-05-16 DIAGNOSIS — Z7902 Long term (current) use of antithrombotics/antiplatelets: Secondary | ICD-10-CM | POA: Diagnosis not present

## 2022-05-16 DIAGNOSIS — M25551 Pain in right hip: Secondary | ICD-10-CM | POA: Diagnosis not present

## 2022-05-16 DIAGNOSIS — G894 Chronic pain syndrome: Secondary | ICD-10-CM | POA: Diagnosis not present

## 2022-05-16 DIAGNOSIS — E559 Vitamin D deficiency, unspecified: Secondary | ICD-10-CM | POA: Diagnosis not present

## 2022-05-16 DIAGNOSIS — Z0000 Encounter for general adult medical examination without abnormal findings: Secondary | ICD-10-CM

## 2022-05-16 DIAGNOSIS — Z794 Long term (current) use of insulin: Secondary | ICD-10-CM | POA: Diagnosis not present

## 2022-05-16 DIAGNOSIS — Z7984 Long term (current) use of oral hypoglycemic drugs: Secondary | ICD-10-CM | POA: Diagnosis not present

## 2022-05-16 DIAGNOSIS — E119 Type 2 diabetes mellitus without complications: Secondary | ICD-10-CM | POA: Diagnosis not present

## 2022-05-16 DIAGNOSIS — E785 Hyperlipidemia, unspecified: Secondary | ICD-10-CM | POA: Diagnosis not present

## 2022-05-16 DIAGNOSIS — Z122 Encounter for screening for malignant neoplasm of respiratory organs: Secondary | ICD-10-CM

## 2022-05-16 DIAGNOSIS — Z87440 Personal history of urinary (tract) infections: Secondary | ICD-10-CM | POA: Diagnosis not present

## 2022-05-16 DIAGNOSIS — I69351 Hemiplegia and hemiparesis following cerebral infarction affecting right dominant side: Secondary | ICD-10-CM | POA: Diagnosis not present

## 2022-05-16 DIAGNOSIS — G43711 Chronic migraine without aura, intractable, with status migrainosus: Secondary | ICD-10-CM | POA: Diagnosis not present

## 2022-05-16 DIAGNOSIS — M545 Low back pain, unspecified: Secondary | ICD-10-CM | POA: Diagnosis not present

## 2022-05-16 DIAGNOSIS — F17210 Nicotine dependence, cigarettes, uncomplicated: Secondary | ICD-10-CM | POA: Diagnosis not present

## 2022-05-16 DIAGNOSIS — J9611 Chronic respiratory failure with hypoxia: Secondary | ICD-10-CM | POA: Diagnosis not present

## 2022-05-16 DIAGNOSIS — J449 Chronic obstructive pulmonary disease, unspecified: Secondary | ICD-10-CM | POA: Diagnosis not present

## 2022-05-16 DIAGNOSIS — J432 Centrilobular emphysema: Secondary | ICD-10-CM | POA: Diagnosis not present

## 2022-05-16 MED ORDER — ATORVASTATIN CALCIUM 40 MG PO TABS
40.0000 mg | ORAL_TABLET | Freq: Every day | ORAL | 0 refills | Status: DC
Start: 2022-05-16 — End: 2023-01-08

## 2022-05-16 NOTE — Telephone Encounter (Signed)
Reason for Disposition  Caller has medicine question only, adult not sick, AND triager answers question    Pt will call back.   PT arrived to do her therapy.  Answer Assessment - Initial Assessment Questions 1. NAME of MEDICINE: "What medicine(s) are you calling about?"     Atorvastatin 2. QUESTION: "What is your question?" (e.g., double dose of medicine, side effect)     The office called her with a question about the atorvastatin.   Pt does not know what this is about.   Her home physical therapist just knocked on the door so she needed to go for her therapy.   She will call back after her therapy. 3. PRESCRIBER: "Who prescribed the medicine?" Reason: if prescribed by specialist, call should be referred to that group.      4. SYMPTOMS: "Do you have any symptoms?" If Yes, ask: "What symptoms are you having?"  "How bad are the symptoms (e.g., mild, moderate, severe)      5. PREGNANCY:  "Is there any chance that you are pregnant?" "When was your last menstrual period?"  Protocols used: Medication Question Call-A-AH

## 2022-05-16 NOTE — Patient Instructions (Signed)
[name] , Thank you for taking time to come for your Medicare Wellness Visit. I appreciate your ongoing commitment to your health goals. Please review the following plan we discussed and let me know if I can assist you in the future.   These are the goals we discussed:  Goals      Care Coordination needs     Care Coordination Interventions: Follow up phone call to patient and patient's daughter in-law [name] regarding requested DME and HH follow up Patient continues to  reside in [facility] [place] [facility] with her son and daughter in law [name] -[facility] following-patient has requested orders for lab work and a bariatric bed Patient further confirmed call to [facility] to change delivery address for her incontinent supplies Patient's daughter in law previously confirmed that patient has a wheelchair, hospital bed, hoyer lift and bedside commode. Patient's daughter in law looking to purchase  a shower chair as well Patient's daughter in law encouraged to identify a local primary care doctor for patient Patient's daughter in law agreeable and states she working on arranging for a local provider-she will base this on  This social worker encouraged patient to call this Child psychotherapist with any additional community resource needs      DIET - EAT MORE FRUITS AND VEGETABLES     HEMOGLOBIN A1C < 7     Pharmacy Goals     Our goal A1c is less than 7%. This corresponds with fasting sugars less than 130 and 2 hour after meal sugars less than 180. Please check your blood sugar, keep a log of the results and have this for [place] to review during our next telephone appointment  Our goal bad cholesterol, or LDL, is less than 70 . This is why it is important to continue taking your atorvastatin  Please consider calling the [place] [employer]uitline for their help with quitting smoking.  The [place] [employer]uitline phone number is: [phone]  Feel free to call me with any questions or concerns. I look forward to our next call!  Estelle Grumbles, PharmD, Patsy [place], CPP Clinical Pharmacist [facility] [phone]        This is a list of the screening recommended for you and due dates:  Health Maintenance  Topic Date Due   Complete foot exam   Never done   Eye exam for diabetics  Never done   Yearly kidney health urinalysis for diabetes  Never done   Hepatitis C Screening: USPSTF Recommendation to screen - Ages 66-79 yo.  Never done   DTaP/Tdap/Td vaccine (1 - Tdap) Never done   Pap Smear  Never done   Colon Cancer Screening  Never done   Mammogram  Never done   Zoster (Shingles) Vaccine (1 of 2) Never done   Screening for Lung Cancer  10/10/2018   COVID-19 Vaccine (1) 05/22/2022*   Flu Shot  08/14/2022   Hemoglobin A1C  11/05/2022   Yearly kidney function blood test for diabetes  12/12/2022   Medicare Annual Wellness Visit  05/16/2023   HIV Screening  Completed   HPV Vaccine  Aged Out  *Topic was postponed. The date shown is not the original due date.    Advanced directives: no  Conditions/risks identified: none  Next appointment: Follow up in one year for your annual wellness visit. 05/22/23 @ 3:00 pm by phone  Preventive Care 40-64 Years, Female Preventive care refers to lifestyle choices and visits with your health care provider that can  promote health and wellness. What does preventive care include? A yearly physical exam. This is also called an annual well check. Dental exams once or twice a year. Routine eye exams. Ask your health care provider how often you should have your eyes checked. Personal lifestyle choices, including: Daily care of your teeth and gums. Regular physical activity. Eating a healthy diet. Avoiding tobacco and drug use. Limiting alcohol use. Practicing safe sex. Taking low-dose aspirin daily starting at age 29. Taking vitamin and mineral supplements as recommended by your health care provider. What happens during an annual well check? The services  and screenings done by your health care provider during your annual well check will depend on your age, overall health, lifestyle risk factors, and family history of disease. Counseling  Your health care provider may ask you questions about your: Alcohol use. Tobacco use. Drug use. Emotional well-being. Home and relationship well-being. Sexual activity. Eating habits. Work and work Astronomer. Method of birth control. Menstrual cycle. Pregnancy history. Screening  You may have the following tests or measurements: Height, weight, and BMI. Blood pressure. Lipid and cholesterol levels. These may be checked every 5 years, or more frequently if you are over 51 years old. Skin check. Lung cancer screening. You may have this screening every year starting at age 58 if you have a 30-pack-year history of smoking and currently smoke or have quit within the past 15 years. Fecal occult blood test (FOBT) of the stool. You may have this test every year starting at age 71. Flexible sigmoidoscopy or colonoscopy. You may have a sigmoidoscopy every 5 years or a colonoscopy every 10 years starting at age 67. Hepatitis C blood test. Hepatitis B blood test. Sexually transmitted disease (STD) testing. Diabetes screening. This is done by checking your blood sugar (glucose) after you have not eaten for a while (fasting). You may have this done every 1-3 years. Mammogram. This may be done every 1-2 years. Talk to your health care provider about when you should start having regular mammograms. This may depend on whether you have a family history of breast cancer. BRCA-related cancer screening. This may be done if you have a family history of breast, ovarian, tubal, or peritoneal cancers. Pelvic exam and Pap test. This may be done every 3 years starting at age 50. Starting at age 25, this may be done every 5 years if you have a Pap test in combination with an HPV test. Bone density scan. This is done to screen  for osteoporosis. You may have this scan if you are at high risk for osteoporosis. Discuss your test results, treatment options, and if necessary, the need for more tests with your health care provider. Vaccines  Your health care provider may recommend certain vaccines, such as: Influenza vaccine. This is recommended every year. Tetanus, diphtheria, and acellular pertussis (Tdap, Td) vaccine. You may need a Td booster every 10 years. Zoster vaccine. You may need this after age 62. Pneumococcal 13-valent conjugate (PCV13) vaccine. You may need this if you have certain conditions and were not previously vaccinated. Pneumococcal polysaccharide (PPSV23) vaccine. You may need one or two doses if you smoke cigarettes or if you have certain conditions. Talk to your health care provider about which screenings and vaccines you need and how often you need them. This information is not intended to replace advice given to you by your health care provider. Make sure you discuss any questions you have with your health care provider. Document Released: 01/26/2015 Document Revised:  09/19/2015 Document Reviewed: 10/31/2014 Elsevier Interactive Patient Education  2017 [employer].    Fall Prevention in the Home Falls can cause injuries. They can happen to people of all ages. There are many things you can do to make your home safe and to help prevent falls. What can I do on the outside of my home? Regularly fix the edges of walkways and driveways and fix any cracks. Remove anything that might make you trip as you walk through a door, such as a raised step or threshold. Trim any bushes or trees on the path to your home. Use bright outdoor lighting. Clear any walking paths of anything that might make someone trip, such as rocks or tools. Regularly check to see if handrails are loose or broken. Make sure that both sides of any steps have handrails. Any raised decks and porches should have guardrails on the  edges. Have any leaves, snow, or ice cleared regularly. Use sand or salt on walking paths during winter. Clean up any spills in your garage right away. This includes oil or grease spills. What can I do in the bathroom? Use night lights. Install grab bars by the toilet and in the tub and shower. Do not use towel bars as grab bars. Use non-skid mats or decals in the tub or shower. If you need to sit down in the shower, use a plastic, non-slip stool. Keep the floor dry. Clean up any water that spills on the floor as soon as it happens. Remove soap buildup in the tub or shower regularly. Attach bath mats securely with double-sided non-slip rug tape. Do not have throw rugs and other things on the floor that can make you trip. What can I do in the bedroom? Use night lights. Make sure that you have a light by your bed that is easy to reach. Do not use any sheets or blankets that are too big for your bed. They should not hang down onto the floor. Have a firm chair that has side arms. You can use this for support while you get dressed. Do not have throw rugs and other things on the floor that can make you trip. What can I do in the kitchen? Clean up any spills right away. Avoid walking on wet floors. Keep items that you use a lot in easy-to-reach places. If you need to reach something above you, use a strong step stool that has a grab bar. Keep electrical cords out of the way. Do not use floor polish or wax that makes floors slippery. If you must use wax, use non-skid floor wax. Do not have throw rugs and other things on the floor that can make you trip. What can I do with my stairs? Do not leave any items on the stairs. Make sure that there are handrails on both sides of the stairs and use them. Fix handrails that are broken or loose. Make sure that handrails are as long as the stairways. Check any carpeting to make sure that it is firmly attached to the stairs. Fix any carpet that is loose or  worn. Avoid having throw rugs at the top or bottom of the stairs. If you do have throw rugs, attach them to the floor with carpet tape. Make sure that you have a light switch at the top of the stairs and the bottom of the stairs. If you do not have them, ask someone to add them for you. What else can I do to help prevent falls?  Wear shoes that: Do not have high heels. Have rubber bottoms. Are comfortable and fit you well. Are closed at the toe. Do not wear sandals. If you use a stepladder: Make sure that it is fully opened. Do not climb a closed stepladder. Make sure that both sides of the stepladder are locked into place. Ask someone to hold it for you, if possible. Clearly mark and make sure that you can see: Any grab bars or handrails. First and last steps. Where the edge of each step is. Use tools that help you move around (mobility aids) if they are needed. These include: Canes. Walkers. Scooters. Crutches. Turn on the lights when you go into a dark area. Replace any light bulbs as soon as they burn out. Set up your furniture so you have a clear path. Avoid moving your furniture around. If any of your floors are uneven, fix them. If there are any pets around you, be aware of where they are. Review your medicines with your doctor. Some medicines can make you feel dizzy. This can increase your chance of falling. Ask your doctor what other things that you can do to help prevent falls. This information is not intended to replace advice given to you by your health care provider. Make sure you discuss any questions you have with your health care provider. Document Released: 10/26/2008 Document Revised: 06/07/2015 Document Reviewed: 02/03/2014 Elsevier Interactive Patient Education  2017 [employer].

## 2022-05-16 NOTE — Telephone Encounter (Signed)
  Chief Complaint: Pt called in saying she was returning a call from the office about a medication.   Atorvastatin.   Her physical therapist came while on the phone so she will call us back after therapy. Symptoms:  Frequency:  Pertinent Negatives: Patient denies knowing what the call is for other than something about atorvastatin. Disposition: [] ED /[] Urgent Care (no appt availability in office) / [] Appointment(In office/virtual)/ []  [facility]/ [x] Home Care/ [] Refused Recommended Disposition /[] [facility]/ []  Follow-up with PCP Additional Notes: Pt will call back after physical therapy session.   (See notes below)   In looking through her chart this issue was taken care of via communication via MyChart and Dr. Althea Charon.   The rx for the atorvastatin has been sent to [id number], 3 refills on 05/15/2022.   She switched from [facility] to [place].      They are sending a supply to local [facility] to last her until the mail order arrives. 05/16/2022 Sent to [facility].

## 2022-05-16 NOTE — Addendum Note (Signed)
Addended by: Judd Gaudier on: 05/16/2022 01:22 PM   Modules accepted: Orders

## 2022-05-16 NOTE — Progress Notes (Signed)
I connected with  [name] on 05/16/22 by a audio enabled telemedicine application and verified that I am speaking with the correct person using two identifiers.  Patient Location: Home  Provider Location: Office/Clinic  I discussed the limitations of evaluation and management by telemedicine. The patient expressed understanding and agreed to proceed.  Subjective:   [name] is a 57 y.o. female who presents for Medicare Annual (Subsequent) preventive examination.  Review of Systems     Cardiac Risk Factors include: advanced age (>86men, >85 women);diabetes mellitus     Objective:    Today's Vitals   05/16/22 1530  PainSc: 6    There is no height or weight on file to calculate BMI.     05/16/2022    3:36 PM 12/11/2021    5:49 AM 06/12/2020    9:14 PM 06/09/2020    2:48 PM 08/09/2017    7:00 PM 08/09/2017    5:38 AM 08/08/2017   11:54 PM  Advanced Directives  Does Patient Have a Medical Advance Directive? No No No No No No No  Would patient like information on creating a medical advance directive? No - Patient declined No - Patient declined No - Patient declined  No - Patient declined No - Patient declined No - Patient declined    Current Medications (verified) Outpatient Encounter Medications as of 05/16/2022  Medication Sig   albuterol (VENTOLIN HFA) 108 (90 Base) MCG/ACT inhaler USE 2 INHALATIONS BY MOUTH EVERY 4 HOURS AS NEEDED FOR WHEEZING  OR SHORTNESS OF BREATH   azelastine (ASTELIN) 0.1 % nasal spray Place 2 sprays into both nostrils 2 (two) times daily.   buPROPion (WELLBUTRIN XL) 150 MG 24 hr tablet Take 1 tablet (150 mg total) by mouth daily.   citalopram (CELEXA) 40 MG tablet TAKE 1 TABLET BY MOUTH DAILY   clopidogrel (PLAVIX) 75 MG tablet Take 1 tablet (75 mg total) by mouth daily.   gabapentin (NEURONTIN) 600 MG tablet TAKE 1 TABLET(600 MG) BY MOUTH FOUR TIMES DAILY   IBU 600 MG tablet Take 600 mg by mouth every 12 (twelve) hours as needed.   metFORMIN  (GLUCOPHAGE) 1000 MG tablet TAKE 1 TABLET(1000 MG) BY MOUTH TWICE DAILY WITH A MEAL   methocarbamol (ROBAXIN) 500 MG tablet Take 1 tablet (500 mg total) by mouth 3 (three) times daily.   MOUNJARO 10 MG/0.5ML Pen Inject 10 mg into the skin once a week.   nystatin (MYCOSTATIN/NYSTOP) powder Apply 1 Application topically 3 (three) times daily as needed (yeast rash intertrigo).   oxyCODONE-acetaminophen (PERCOCET) 10-325 MG tablet Take 1 tablet by mouth every 8 (eight) hours as needed for pain.   pantoprazole (PROTONIX) 40 MG tablet Take 1 tablet (40 mg total) by mouth daily.   QUEtiapine (SEROQUEL) 100 MG tablet TAKE 1 TABLET BY MOUTH AT  BEDTIME   topiramate (TOPAMAX) 100 MG tablet Take 1 tablet (100 mg total) by mouth 2 (two) times daily.   VITAMIN D PO Take by mouth.   atorvastatin (LIPITOR) 40 MG tablet Take 1 tablet (40 mg total) by mouth at bedtime. (Patient not taking: Reported on 05/16/2022)   [DISCONTINUED] MOUNJARO 10 MG/0.5ML Pen Inject 10 mg into the skin once a week.   No facility-administered encounter medications on file as of 05/16/2022.    Allergies (verified) Tramadol and Augmentin [amoxicillin-pot clavulanate]   History: Past Medical History:  Diagnosis Date   Anxiety    Cancer (HCC)    a spot on liver and treated  Complication of anesthesia    restless,easily upset   COPD (chronic obstructive pulmonary disease) (HCC)    Depression    Diabetes mellitus without complication (HCC)    Diverticulitis    Fatty liver    GERD (gastroesophageal reflux disease)    Headache(784.0)    migraines   History of kidney stones    Hyperlipidemia    Hypertension    Pneumonia    Restless    Stroke [facility])    Past Surgical History:  Procedure Laterality Date   ABDOMINAL HYSTERECTOMY     ACHILLES TENDON LENGTHENING Right 08/18/2017   Procedure: ACHILLES TENDON LENGTHENING;  Surgeon: Myrene Galas, MD;  Location: [facility] OR;  Service: Orthopedics;  Laterality: Right;   ANTERIOR CERVICAL  DECOMP/DISCECTOMY FUSION N/A 03/11/2012   Procedure: ANTERIOR CERVICAL DECOMPRESSION/DISCECTOMY FUSION 1 LEVEL;  Surgeon: Cristi Loron, MD;  Location: [facility] NEURO ORS;  Service: Neurosurgery;  Laterality: N/A;  Cervical five-six anterior cervical decompression with fusion interbody prothesis plating and bonegraft   BACK SURGERY     EXTERNAL FIXATION LEG Right 08/10/2017   Procedure: EXTERNAL FIXATION ANKLE;  Surgeon: Donato Heinz, MD;  Location: [facility] ORS;  Service: Orthopedics;  Laterality: Right;   EXTERNAL FIXATION REMOVAL Right 08/18/2017   Procedure: REMOVAL EXTERNAL FIXATION LEG;  Surgeon: Myrene Galas, MD;  Location: [facility] OR;  Service: Orthopedics;  Laterality: Right;   EYE SURGERY     HERNIA REPAIR     IR FL GUIDED LOC OF NEEDLE/CATH TIP FOR SPINAL INJECTION LT  12/09/2021   IR FL GUIDED LOC OF NEEDLE/CATH TIP FOR SPINAL INJECTION RT  12/09/2021   JOINT REPLACEMENT     bil knees   ORIF ANKLE FRACTURE Right 08/18/2017   Procedure: OPEN REDUCTION INTERNAL FIXATION (ORIF) ANKLE FRACTURE;  Surgeon: Myrene Galas, MD;  Location: [facility] OR;  Service: Orthopedics;  Laterality: Right;   REPLACEMENT TOTAL KNEE BILATERAL     Family History  Problem Relation Age of Onset   Diabetes Mother    Hypertension Mother    Hypertension Father    Social History   Socioeconomic History   Marital status: Married    Spouse name: Not on file   Number of children: Not on file   Years of education: Not on file   Highest education level: Not on file  Occupational History   Not on file  Tobacco Use   Smoking status: Every Day    Packs/day: 1.00    Years: 20.00    Additional pack years: 0.00    Total pack years: 20.00    Types: Cigarettes   Smokeless tobacco: Never  Vaping Use   Vaping Use: Former  Substance and Sexual Activity   Alcohol use: No   Drug use: Yes    Types: Marijuana    Comment: last smoked 2 days ago  8/4   Sexual activity: Not on file  Other Topics Concern   Not on file   Social History Narrative   Not on file   Social Determinants of Health   Financial Resource Strain: Low Risk  (05/16/2022)   Overall Financial Resource Strain (CARDIA)    Difficulty of Paying Living Expenses: Not very hard  Food Insecurity: No Food Insecurity (05/16/2022)   Hunger Vital Sign    Worried About Running Out of Food in the Last Year: Never true    Ran Out of Food in the Last Year: Never true  Transportation Needs: No Transportation Needs (05/16/2022)   PRAPARE - Transportation  Lack of Transportation (Medical): No    Lack of Transportation (Non-Medical): No  Physical Activity: Inactive (05/16/2022)   Exercise Vital Sign    Days of Exercise per Week: 0 days    Minutes of Exercise per Session: 0 min  Stress: Stress Concern Present (05/16/2022)   [employer] of Occupational Health - Occupational Stress Questionnaire    Feeling of Stress : To some extent  Social Connections: Moderately Isolated (05/16/2022)   Social Connection and Isolation Panel [NHANES]    Frequency of Communication with Friends and Family: More than three times a week    Frequency of Social Gatherings with Friends and Family: Once a week    Attends Religious Services: Never    Database administrator or Organizations: No    Attends Engineer, structural: Never    Marital Status: Married    Tobacco Counseling Ready to quit: Not Answered Counseling given: Not Answered   Clinical Intake:  Pre-visit preparation completed: Yes  Pain : 0-10 Pain Score: 6  Pain Type: Chronic pain Pain Location: Back     Nutritional Risks: None Diabetes: Yes CBG done?: No Did pt. bring in CBG monitor from home?: No  How often do you need to have someone help you when you read instructions, pamphlets, or other written materials from your doctor or pharmacy?: 1 - Never  Diabetic?yes Nutrition Risk Assessment:  Has the patient had any N/V/D within the last 2 months?  Yes  Does the patient have any  non-healing wounds?  No  Has the patient had any unintentional weight loss or weight gain?  No   Diabetes:  Is the patient diabetic?  Yes  If diabetic, was a CBG obtained today?  No  Did the patient bring in their glucometer from home?  No  How often do you monitor your CBG's? Once/day.   Financial Strains and Diabetes Management:  Are you having any financial strains with the device, your supplies or your medication? No .  Does the patient want to be seen by Chronic Care Management for management of their diabetes?  No  Would the patient like to be referred to a Nutritionist or for Diabetic Management?  No   Diabetic Exams:  Diabetic Eye Exam: Completed no.  Pt has been advised about the importance in completing this exam.  Diabetic Foot Exam: Completed no. Pt has been advised about the importance in completing this exam.   Interpreter Needed?: No  Information entered by :: Kennedy Bucker, LPN   Activities of Daily Living    05/16/2022    3:37 PM 12/11/2021    5:51 AM  In your present state of health, do you have any difficulty performing the following activities:  Hearing? 0   Vision? 0   Difficulty concentrating or making decisions? 0   Walking or climbing stairs? 1   Dressing or bathing? 0   Doing errands, shopping? 1 0  Preparing Food and eating ? N   Using the Toilet? N   In the past six months, have you accidently leaked urine? N   Do you have problems with loss of bowel control? N   Managing your Medications? N   Managing your Finances? N   Housekeeping or managing your Housekeeping? Y     Patient Care Team: Smitty Cords, DO as PCP - General (Family Medicine)  Indicate any recent Medical Services you may have received from other than [facility] providers in the past year (date may be approximate).  Assessment:   This is a routine wellness examination for [place].  Hearing/Vision screen Hearing Screening - Comments:: No aids Vision Screening -  Comments:: Wears glasses= Dr.Woodard  Dietary issues and exercise activities discussed: Current Exercise Habits: The patient does not participate in regular exercise at present, Exercise limited by: orthopedic condition(s)   Goals Addressed             This Visit's Progress    DIET - EAT MORE FRUITS AND VEGETABLES         Depression Screen    05/16/2022    3:34 PM 05/06/2022    2:43 PM 05/15/2021    1:59 PM 04/17/2021    2:35 PM 03/04/2021    2:30 PM 01/18/2021    1:10 PM 08/06/2020    2:49 PM  PHQ 2/9 Scores  PHQ - 2 Score 2 2 2 2 2  0 2  PHQ- 9 Score 4 12 15 15 10 5 7     Fall Risk    05/16/2022    3:36 PM 05/15/2021    1:59 PM 04/17/2021    2:34 PM 03/04/2021    2:31 PM 02/04/2021    3:06 PM  Fall Risk   Falls in the past year? 0 0 0 1 0  Number falls in past yr: 0 0 1 1 0  Injury with Fall? 0 0 0 0 0  Risk for fall due to : No Fall Risks No Fall Risks Impaired balance/gait;Impaired mobility Impaired mobility;Impaired balance/gait   Follow up Falls prevention discussed;Falls evaluation completed Falls evaluation completed Falls evaluation completed Falls evaluation completed     FALL RISK PREVENTION PERTAINING TO THE HOME:  Any stairs in or around the home? No  If so, are there any without handrails? No  Home free of loose throw rugs in walkways, pet beds, electrical cords, etc? Yes  Adequate lighting in your home to reduce risk of falls? Yes   ASSISTIVE DEVICES UTILIZED TO PREVENT FALLS:  Life alert? No  Use of a cane, walker or w/c? Yes -uses w/c Grab bars in the bathroom? No  Shower chair or bench in shower? Yes  Elevated toilet seat or a handicapped toilet? Yes   Cognitive Function:        05/16/2022    3:42 PM 03/04/2021    2:30 PM  6CIT Screen  What Year? 0 points 0 points  What month? 0 points 0 points  What time? 0 points 0 points  Count back from 20 0 points 0 points  Months in reverse 0 points 0 points  Repeat phrase 0 points 0 points  Total Score 0  points 0 points    Immunizations Immunization History  Administered Date(s) Administered   Influenza, Seasonal, Injecte, Preservative Fre 09/23/2007   Influenza,inj,Quad PF,6+ Mos 10/09/2014, 11/28/2020, 10/18/2021   Pneumococcal Polysaccharide-23 08/18/2007, 10/13/2012    TDAP status: Due, Education has been provided regarding the importance of this vaccine. Advised may receive this vaccine at local pharmacy or Health Dept. Aware to provide a copy of the vaccination record if obtained from local pharmacy or Health Dept. Verbalized acceptance and understanding.  Flu Vaccine status: Up to date  Pneumococcal vaccine status: Due, Education has been provided regarding the importance of this vaccine. Advised may receive this vaccine at local pharmacy or Health Dept. Aware to provide a copy of the vaccination record if obtained from local pharmacy or Health Dept. Verbalized acceptance and understanding.  Covid-19 vaccine status: Declined, Education has been provided regarding the  importance of this vaccine but patient still declined. Advised may receive this vaccine at local pharmacy or Health Dept.or vaccine clinic. Aware to provide a copy of the vaccination record if obtained from local pharmacy or Health Dept. Verbalized acceptance and understanding.  Qualifies for Shingles Vaccine? Yes   Zostavax completed No   Shingrix Completed?: No.    Education has been provided regarding the importance of this vaccine. Patient has been advised to call insurance company to determine out of pocket expense if they have not yet received this vaccine. Advised may also receive vaccine at local pharmacy or Health Dept. Verbalized acceptance and understanding.  Screening Tests Health Maintenance  Topic Date Due   FOOT EXAM  Never done   OPHTHALMOLOGY EXAM  Never done   Diabetic kidney evaluation - Urine ACR  Never done   Hepatitis C Screening  Never done   DTaP/Tdap/Td (1 - Tdap) Never done   PAP  SMEAR-Modifier  Never done   COLONOSCOPY (Pts 45-25yrs Insurance coverage will need to be confirmed)  Never done   MAMMOGRAM  Never done   Zoster Vaccines- Shingrix (1 of 2) Never done   Lung Cancer Screening  10/10/2018   COVID-19 Vaccine (1) 05/22/2022 (Originally 01/22/1966)   INFLUENZA VACCINE  08/14/2022   HEMOGLOBIN A1C  11/05/2022   Diabetic kidney evaluation - eGFR measurement  12/12/2022   Medicare Annual Wellness (AWV)  05/16/2023   HIV Screening  Completed   HPV VACCINES  Aged Out    Health Maintenance  Health Maintenance Due  Topic Date Due   FOOT EXAM  Never done   OPHTHALMOLOGY EXAM  Never done   Diabetic kidney evaluation - Urine ACR  Never done   Hepatitis C Screening  Never done   DTaP/Tdap/Td (1 - Tdap) Never done   PAP SMEAR-Modifier  Never done   COLONOSCOPY (Pts 45-36yrs Insurance coverage will need to be confirmed)  Never done   MAMMOGRAM  Never done   Zoster Vaccines- Shingrix (1 of 2) Never done   Lung Cancer Screening  10/10/2018    Declined referrals for colonoscopy & mammogram    Lung Cancer Screening: (Low Dose CT Chest recommended if Age 64-80 years, 30 pack-year currently smoking OR have quit w/in 15years.) does qualify.   Lung Cancer Screening Referral: referral sent today  Additional Screening:  Hepatitis C Screening: does qualify; Completed no  Vision Screening: Recommended annual ophthalmology exams for early detection of glaucoma and other disorders of the eye. Is the patient up to date with their annual eye exam?  Yes  Who is the provider or what is the name of the office in which the patient attends annual eye exams? Dr.Woodard If pt is not established with a provider, would they like to be referred to a provider to establish care? No .   Dental Screening: Recommended annual dental exams for proper oral hygiene  Community Resource Referral / Chronic Care Management: CRR required this visit?  No   CCM required this visit?  No       Plan:     I have personally reviewed and noted the following in the patient's chart:   Medical and social history Use of alcohol, tobacco or illicit drugs  Current medications and supplements including opioid prescriptions. Patient is currently taking opioid prescriptions. Information provided to patient regarding non-opioid alternatives. Patient advised to discuss non-opioid treatment plan with their provider. Functional ability and status Nutritional status Physical activity Advanced directives List of other physicians Hospitalizations,  surgeries, and ER visits in previous 12 months Vitals Screenings to include cognitive, depression, and falls Referrals and appointments  In addition, I have reviewed and discussed with patient certain preventive protocols, quality metrics, and best practice recommendations. A written personalized care plan for preventive services as well as general preventive health recommendations were provided to patient.     Hal Hope, LPN   08/13/1914   Nurse Notes: none

## 2022-05-20 ENCOUNTER — Ambulatory Visit (HOSPITAL_COMMUNITY): Admission: RE | Admit: 2022-05-20 | Payer: 59 | Source: Ambulatory Visit

## 2022-05-21 ENCOUNTER — Telehealth: Payer: Self-pay

## 2022-05-21 DIAGNOSIS — G43711 Chronic migraine without aura, intractable, with status migrainosus: Secondary | ICD-10-CM | POA: Diagnosis not present

## 2022-05-21 DIAGNOSIS — J9611 Chronic respiratory failure with hypoxia: Secondary | ICD-10-CM | POA: Diagnosis not present

## 2022-05-21 DIAGNOSIS — J449 Chronic obstructive pulmonary disease, unspecified: Secondary | ICD-10-CM | POA: Diagnosis not present

## 2022-05-21 DIAGNOSIS — E785 Hyperlipidemia, unspecified: Secondary | ICD-10-CM | POA: Diagnosis not present

## 2022-05-21 DIAGNOSIS — I69351 Hemiplegia and hemiparesis following cerebral infarction affecting right dominant side: Secondary | ICD-10-CM | POA: Diagnosis not present

## 2022-05-21 DIAGNOSIS — Z7984 Long term (current) use of oral hypoglycemic drugs: Secondary | ICD-10-CM | POA: Diagnosis not present

## 2022-05-21 DIAGNOSIS — E559 Vitamin D deficiency, unspecified: Secondary | ICD-10-CM | POA: Diagnosis not present

## 2022-05-21 DIAGNOSIS — Z993 Dependence on wheelchair: Secondary | ICD-10-CM | POA: Diagnosis not present

## 2022-05-21 DIAGNOSIS — J432 Centrilobular emphysema: Secondary | ICD-10-CM | POA: Diagnosis not present

## 2022-05-21 DIAGNOSIS — E119 Type 2 diabetes mellitus without complications: Secondary | ICD-10-CM | POA: Diagnosis not present

## 2022-05-21 DIAGNOSIS — M25551 Pain in right hip: Secondary | ICD-10-CM | POA: Diagnosis not present

## 2022-05-21 DIAGNOSIS — G894 Chronic pain syndrome: Secondary | ICD-10-CM | POA: Diagnosis not present

## 2022-05-21 DIAGNOSIS — Z794 Long term (current) use of insulin: Secondary | ICD-10-CM | POA: Diagnosis not present

## 2022-05-21 DIAGNOSIS — F17210 Nicotine dependence, cigarettes, uncomplicated: Secondary | ICD-10-CM | POA: Diagnosis not present

## 2022-05-21 DIAGNOSIS — Z7902 Long term (current) use of antithrombotics/antiplatelets: Secondary | ICD-10-CM | POA: Diagnosis not present

## 2022-05-21 DIAGNOSIS — M545 Low back pain, unspecified: Secondary | ICD-10-CM | POA: Diagnosis not present

## 2022-05-21 DIAGNOSIS — Z87440 Personal history of urinary (tract) infections: Secondary | ICD-10-CM | POA: Diagnosis not present

## 2022-05-21 NOTE — Telephone Encounter (Signed)
Verbal given for therapy.

## 2022-05-21 NOTE — Telephone Encounter (Signed)
Okay to proceed  Saralyn Pilar, DO [facility] 05/21/2022, 10:47 AM

## 2022-05-21 NOTE — Telephone Encounter (Signed)
Copied from CRM [id number]. Topic: Quick Communication - Home Health Verbal Orders >> May 21, 2022 10:20 AM Everette C wrote: Caller/Agency: Darral Dash / [facility]  Callback Number: [phone] Requesting OT/PT/Skilled Nursing/Social Work/Speech Therapy: OT Frequency: 1w1 2w3

## 2022-05-23 ENCOUNTER — Telehealth: Payer: Self-pay | Admitting: Family Medicine

## 2022-05-23 ENCOUNTER — Encounter: Payer: Self-pay | Admitting: Family Medicine

## 2022-05-23 ENCOUNTER — Other Ambulatory Visit: Payer: Self-pay

## 2022-05-23 DIAGNOSIS — G8929 Other chronic pain: Secondary | ICD-10-CM

## 2022-05-23 DIAGNOSIS — M545 Low back pain, unspecified: Secondary | ICD-10-CM

## 2022-05-23 MED ORDER — METHOCARBAMOL 500 MG PO TABS: 500.0000 mg | ORAL_TABLET | Freq: Three times a day (TID) | ORAL | 5 refills | Status: DC

## 2022-05-23 NOTE — Telephone Encounter (Addendum)
A user error has taken place: encounter opened in error, closed for administrative reasons.

## 2022-05-23 NOTE — Telephone Encounter (Signed)
Copied from CRM [id number]. Topic: General - Other >> May 23, 2022 10:43 AM Carrielelia G wrote: Medication: fluconazole (DIFLUCAN) tablet 100 mg   and   methocarbamol (ROBAXIN) 500 MG tablet  [facility]  [address]  [address] [place], [place] [address] [phone] Valinda Hoar [phone]  Sent document on May 1.  Dominic J is requesting a script and a couple of refills ... He is also refaxing the document with the patient okay in those documents too

## 2022-05-26 DIAGNOSIS — M25551 Pain in right hip: Secondary | ICD-10-CM | POA: Diagnosis not present

## 2022-05-26 DIAGNOSIS — F17210 Nicotine dependence, cigarettes, uncomplicated: Secondary | ICD-10-CM | POA: Diagnosis not present

## 2022-05-26 DIAGNOSIS — J449 Chronic obstructive pulmonary disease, unspecified: Secondary | ICD-10-CM | POA: Diagnosis not present

## 2022-05-26 DIAGNOSIS — E559 Vitamin D deficiency, unspecified: Secondary | ICD-10-CM | POA: Diagnosis not present

## 2022-05-26 DIAGNOSIS — E119 Type 2 diabetes mellitus without complications: Secondary | ICD-10-CM | POA: Diagnosis not present

## 2022-05-26 DIAGNOSIS — Z993 Dependence on wheelchair: Secondary | ICD-10-CM | POA: Diagnosis not present

## 2022-05-26 DIAGNOSIS — E785 Hyperlipidemia, unspecified: Secondary | ICD-10-CM | POA: Diagnosis not present

## 2022-05-26 DIAGNOSIS — G894 Chronic pain syndrome: Secondary | ICD-10-CM | POA: Diagnosis not present

## 2022-05-26 DIAGNOSIS — J9611 Chronic respiratory failure with hypoxia: Secondary | ICD-10-CM | POA: Diagnosis not present

## 2022-05-26 DIAGNOSIS — Z87440 Personal history of urinary (tract) infections: Secondary | ICD-10-CM | POA: Diagnosis not present

## 2022-05-26 DIAGNOSIS — J432 Centrilobular emphysema: Secondary | ICD-10-CM | POA: Diagnosis not present

## 2022-05-26 DIAGNOSIS — Z7902 Long term (current) use of antithrombotics/antiplatelets: Secondary | ICD-10-CM | POA: Diagnosis not present

## 2022-05-26 DIAGNOSIS — Z794 Long term (current) use of insulin: Secondary | ICD-10-CM | POA: Diagnosis not present

## 2022-05-26 DIAGNOSIS — I69351 Hemiplegia and hemiparesis following cerebral infarction affecting right dominant side: Secondary | ICD-10-CM | POA: Diagnosis not present

## 2022-05-26 DIAGNOSIS — G43711 Chronic migraine without aura, intractable, with status migrainosus: Secondary | ICD-10-CM | POA: Diagnosis not present

## 2022-05-26 DIAGNOSIS — M545 Low back pain, unspecified: Secondary | ICD-10-CM | POA: Diagnosis not present

## 2022-05-26 DIAGNOSIS — Z7984 Long term (current) use of oral hypoglycemic drugs: Secondary | ICD-10-CM | POA: Diagnosis not present

## 2022-05-27 DIAGNOSIS — Z87440 Personal history of urinary (tract) infections: Secondary | ICD-10-CM | POA: Diagnosis not present

## 2022-05-27 DIAGNOSIS — J432 Centrilobular emphysema: Secondary | ICD-10-CM | POA: Diagnosis not present

## 2022-05-27 DIAGNOSIS — Z7902 Long term (current) use of antithrombotics/antiplatelets: Secondary | ICD-10-CM | POA: Diagnosis not present

## 2022-05-27 DIAGNOSIS — G43711 Chronic migraine without aura, intractable, with status migrainosus: Secondary | ICD-10-CM | POA: Diagnosis not present

## 2022-05-27 DIAGNOSIS — M25551 Pain in right hip: Secondary | ICD-10-CM | POA: Diagnosis not present

## 2022-05-27 DIAGNOSIS — Z7984 Long term (current) use of oral hypoglycemic drugs: Secondary | ICD-10-CM | POA: Diagnosis not present

## 2022-05-27 DIAGNOSIS — J9611 Chronic respiratory failure with hypoxia: Secondary | ICD-10-CM | POA: Diagnosis not present

## 2022-05-27 DIAGNOSIS — G894 Chronic pain syndrome: Secondary | ICD-10-CM | POA: Diagnosis not present

## 2022-05-27 DIAGNOSIS — Z794 Long term (current) use of insulin: Secondary | ICD-10-CM | POA: Diagnosis not present

## 2022-05-27 DIAGNOSIS — E119 Type 2 diabetes mellitus without complications: Secondary | ICD-10-CM | POA: Diagnosis not present

## 2022-05-27 DIAGNOSIS — J449 Chronic obstructive pulmonary disease, unspecified: Secondary | ICD-10-CM | POA: Diagnosis not present

## 2022-05-27 DIAGNOSIS — Z993 Dependence on wheelchair: Secondary | ICD-10-CM | POA: Diagnosis not present

## 2022-05-27 DIAGNOSIS — F17210 Nicotine dependence, cigarettes, uncomplicated: Secondary | ICD-10-CM | POA: Diagnosis not present

## 2022-05-27 DIAGNOSIS — E559 Vitamin D deficiency, unspecified: Secondary | ICD-10-CM | POA: Diagnosis not present

## 2022-05-27 DIAGNOSIS — I639 Cerebral infarction, unspecified: Secondary | ICD-10-CM | POA: Diagnosis not present

## 2022-05-27 DIAGNOSIS — E785 Hyperlipidemia, unspecified: Secondary | ICD-10-CM | POA: Diagnosis not present

## 2022-05-27 DIAGNOSIS — R54 Age-related physical debility: Secondary | ICD-10-CM | POA: Diagnosis not present

## 2022-05-27 DIAGNOSIS — I69351 Hemiplegia and hemiparesis following cerebral infarction affecting right dominant side: Secondary | ICD-10-CM | POA: Diagnosis not present

## 2022-05-27 DIAGNOSIS — M545 Low back pain, unspecified: Secondary | ICD-10-CM | POA: Diagnosis not present

## 2022-05-28 ENCOUNTER — Other Ambulatory Visit: Payer: Self-pay | Admitting: Physician Assistant

## 2022-05-28 ENCOUNTER — Ambulatory Visit: Payer: Medicaid Other

## 2022-05-28 DIAGNOSIS — M4856XA Collapsed vertebra, not elsewhere classified, lumbar region, initial encounter for fracture: Secondary | ICD-10-CM

## 2022-05-29 ENCOUNTER — Encounter: Payer: Self-pay | Admitting: Family Medicine

## 2022-05-29 ENCOUNTER — Other Ambulatory Visit: Payer: Self-pay | Admitting: Family Medicine

## 2022-05-29 DIAGNOSIS — E785 Hyperlipidemia, unspecified: Secondary | ICD-10-CM | POA: Diagnosis not present

## 2022-05-29 DIAGNOSIS — F17210 Nicotine dependence, cigarettes, uncomplicated: Secondary | ICD-10-CM | POA: Diagnosis not present

## 2022-05-29 DIAGNOSIS — Z7984 Long term (current) use of oral hypoglycemic drugs: Secondary | ICD-10-CM | POA: Diagnosis not present

## 2022-05-29 DIAGNOSIS — G894 Chronic pain syndrome: Secondary | ICD-10-CM | POA: Diagnosis not present

## 2022-05-29 DIAGNOSIS — Z794 Long term (current) use of insulin: Secondary | ICD-10-CM | POA: Diagnosis not present

## 2022-05-29 DIAGNOSIS — G43711 Chronic migraine without aura, intractable, with status migrainosus: Secondary | ICD-10-CM | POA: Diagnosis not present

## 2022-05-29 DIAGNOSIS — E1169 Type 2 diabetes mellitus with other specified complication: Secondary | ICD-10-CM

## 2022-05-29 DIAGNOSIS — E559 Vitamin D deficiency, unspecified: Secondary | ICD-10-CM | POA: Diagnosis not present

## 2022-05-29 DIAGNOSIS — M25551 Pain in right hip: Secondary | ICD-10-CM | POA: Diagnosis not present

## 2022-05-29 DIAGNOSIS — E119 Type 2 diabetes mellitus without complications: Secondary | ICD-10-CM | POA: Diagnosis not present

## 2022-05-29 DIAGNOSIS — J9611 Chronic respiratory failure with hypoxia: Secondary | ICD-10-CM | POA: Diagnosis not present

## 2022-05-29 DIAGNOSIS — J432 Centrilobular emphysema: Secondary | ICD-10-CM | POA: Diagnosis not present

## 2022-05-29 DIAGNOSIS — M545 Low back pain, unspecified: Secondary | ICD-10-CM | POA: Diagnosis not present

## 2022-05-29 DIAGNOSIS — I69351 Hemiplegia and hemiparesis following cerebral infarction affecting right dominant side: Secondary | ICD-10-CM | POA: Diagnosis not present

## 2022-05-29 DIAGNOSIS — Z87440 Personal history of urinary (tract) infections: Secondary | ICD-10-CM | POA: Diagnosis not present

## 2022-05-29 DIAGNOSIS — Z7902 Long term (current) use of antithrombotics/antiplatelets: Secondary | ICD-10-CM | POA: Diagnosis not present

## 2022-05-29 DIAGNOSIS — J449 Chronic obstructive pulmonary disease, unspecified: Secondary | ICD-10-CM | POA: Diagnosis not present

## 2022-05-29 DIAGNOSIS — Z993 Dependence on wheelchair: Secondary | ICD-10-CM | POA: Diagnosis not present

## 2022-05-29 MED ORDER — MOUNJARO 10 MG/0.5ML ~~LOC~~ SOAJ
10.0000 mg | SUBCUTANEOUS | 2 refills | Status: DC
Start: 2022-05-29 — End: 2022-09-23
  Filled 2022-05-29: qty 2, 28d supply, fill #0
  Filled 2022-06-27: qty 2, 28d supply, fill #1
  Filled 2022-09-23: qty 2, 28d supply, fill #2

## 2022-05-29 NOTE — Telephone Encounter (Signed)
Medication Refill - Medication: MOUNJARO 10 MG/0.5ML Pen   Pt called in requesting to speak with someone from the office; she stated she needs them to fax a request for her medication as soon as possible because she has been having a very hard time finding her medication, and  the pharmacy below has one box.  I called the office as pt insisted on speaking with someone at the office and was unable to reach anyone.  Please advise.  Has the patient contacted their pharmacy? Yes.    (Agent: If yes, when and what did the pharmacy advise?)  Preferred Pharmacy (with phone number or street name):  [facility]  [address] [place] [address]  Phone: [phone] Fax: [phone]  Hours: M-F 7:30a-5:30p   Has the patient been seen for an appointment in the last year OR does the patient have an upcoming appointment? Yes.    Agent: Please be advised that RX refills may take up to 3 business days. We ask that you follow-up with your pharmacy.

## 2022-05-29 NOTE — Telephone Encounter (Signed)
Requested medication (s) are due for refill today: No  Requested medication (s) are on the active medication list: yes    Last refill: 05/07/22   2ml  2 refills  Future visit scheduled no  Notes to clinic: Off protocol, please review. Thank you.    Per agent: "Pt called in requesting to speak with someone from the office; she stated she needs them to fax a request for her medication as soon as possible because she has been having a very hard time finding her medication, and  the pharmacy below has one box.   I called the office as pt insisted on speaking with someone at the office and was unable to reach anyone." Requested Prescriptions  Pending Prescriptions Disp Refills   MOUNJARO 10 MG/0.5ML Pen 2 mL 2    Sig: Inject 10 mg into the skin once a week.     Off-Protocol Failed - 05/29/2022  5:00 PM      Failed - Medication not assigned to a protocol, review manually.      Passed - Valid encounter within last 12 months    Recent Outpatient Visits           3 weeks ago Chronic pain syndrome   [place] [place], Netta Neat, DO   1 month ago Acute cystitis without hematuria   [facility] [facility] [place], Netta Neat, DO   4 months ago Hemiparesis of right dominant side as late effect of cerebrovascular disease, unspecified cerebrovascular disease type [facility])   [place] [facility] [place], Netta Neat, DO   5 months ago Type 2 diabetes mellitus with other specified complication, without long-term current use of insulin [facility])   [place] [facility] [place], Gentry Fitz A, RPH-CPP   5 months ago History of cerebrovascular accident (CVA) with residual deficit   [place] [facility] [place], Netta Neat, DO       Future Appointments             In 1 month MacDiarmid, Lorin Picket, MD [facility]

## 2022-05-30 ENCOUNTER — Telehealth: Payer: Self-pay

## 2022-05-30 ENCOUNTER — Other Ambulatory Visit: Payer: Self-pay

## 2022-05-30 DIAGNOSIS — G43711 Chronic migraine without aura, intractable, with status migrainosus: Secondary | ICD-10-CM | POA: Diagnosis not present

## 2022-05-30 DIAGNOSIS — Z794 Long term (current) use of insulin: Secondary | ICD-10-CM | POA: Diagnosis not present

## 2022-05-30 DIAGNOSIS — J432 Centrilobular emphysema: Secondary | ICD-10-CM | POA: Diagnosis not present

## 2022-05-30 DIAGNOSIS — Z7902 Long term (current) use of antithrombotics/antiplatelets: Secondary | ICD-10-CM | POA: Diagnosis not present

## 2022-05-30 DIAGNOSIS — Z87440 Personal history of urinary (tract) infections: Secondary | ICD-10-CM | POA: Diagnosis not present

## 2022-05-30 DIAGNOSIS — E785 Hyperlipidemia, unspecified: Secondary | ICD-10-CM | POA: Diagnosis not present

## 2022-05-30 DIAGNOSIS — F17210 Nicotine dependence, cigarettes, uncomplicated: Secondary | ICD-10-CM | POA: Diagnosis not present

## 2022-05-30 DIAGNOSIS — G894 Chronic pain syndrome: Secondary | ICD-10-CM | POA: Diagnosis not present

## 2022-05-30 DIAGNOSIS — Z7984 Long term (current) use of oral hypoglycemic drugs: Secondary | ICD-10-CM | POA: Diagnosis not present

## 2022-05-30 DIAGNOSIS — J449 Chronic obstructive pulmonary disease, unspecified: Secondary | ICD-10-CM | POA: Diagnosis not present

## 2022-05-30 DIAGNOSIS — E559 Vitamin D deficiency, unspecified: Secondary | ICD-10-CM | POA: Diagnosis not present

## 2022-05-30 DIAGNOSIS — M25551 Pain in right hip: Secondary | ICD-10-CM | POA: Diagnosis not present

## 2022-05-30 DIAGNOSIS — M545 Low back pain, unspecified: Secondary | ICD-10-CM | POA: Diagnosis not present

## 2022-05-30 DIAGNOSIS — E119 Type 2 diabetes mellitus without complications: Secondary | ICD-10-CM | POA: Diagnosis not present

## 2022-05-30 DIAGNOSIS — I69351 Hemiplegia and hemiparesis following cerebral infarction affecting right dominant side: Secondary | ICD-10-CM | POA: Diagnosis not present

## 2022-05-30 DIAGNOSIS — J9611 Chronic respiratory failure with hypoxia: Secondary | ICD-10-CM | POA: Diagnosis not present

## 2022-05-30 DIAGNOSIS — Z993 Dependence on wheelchair: Secondary | ICD-10-CM | POA: Diagnosis not present

## 2022-05-30 NOTE — Telephone Encounter (Signed)
Patient called back to get an update on the mri order. Patient would like to know if anesthesia can be ordered to used during her mri. Patient states she is very claustrophobic and cannot do a closed mri with out it. Please advise.

## 2022-05-30 NOTE — Telephone Encounter (Signed)
Please review.  I do not see a message regarding this.     Thanks,   -Vernona Rieger

## 2022-05-30 NOTE — Telephone Encounter (Signed)
Copied from CRM [id number]. Topic: General - Other >> May 30, 2022  1:41 PM Dominique A wrote: Reason for CRM: Pt states that she has an urgent situation and is needing to speak with her PCP or his nurse. Per pt a mychart message was sent on Wednesday regarding anesthesia for a MRI that she is needing and pt states that she cannot keep waiting for her MRI. Please call pt back.

## 2022-05-30 NOTE — Telephone Encounter (Signed)
Called patient.  She is followed by [facility] Cam Hai PA is the ordering provider for Lumbar MRI Spine imaging.  She needs an OPEN MRI - only available place to do this is [facility] in [place].  MRI was scheduled for Weds 5/15 but cancelled since she needs anesthesia to proceed.  She said the imaging center is waiting on [place] to complete a form that was faxed here on Weds 5/15 to give her "Medical Clearance" for anesthesia.  I was not aware of this form and I have not seen it yet.  Could you assist to see if we have received this at all by fax from [facility]?  Otherwise we would need to request a new form sent to [place].  Patient provided this # for [facility] but she is unsure if it is phone or fax [phone]  Saralyn Pilar, DO [facility] 05/30/2022, 4:57 PM

## 2022-06-02 DIAGNOSIS — Z79899 Other long term (current) drug therapy: Secondary | ICD-10-CM | POA: Diagnosis not present

## 2022-06-02 DIAGNOSIS — M25562 Pain in left knee: Secondary | ICD-10-CM | POA: Diagnosis not present

## 2022-06-02 DIAGNOSIS — M25561 Pain in right knee: Secondary | ICD-10-CM | POA: Diagnosis not present

## 2022-06-02 DIAGNOSIS — F17200 Nicotine dependence, unspecified, uncomplicated: Secondary | ICD-10-CM | POA: Diagnosis not present

## 2022-06-02 DIAGNOSIS — G8929 Other chronic pain: Secondary | ICD-10-CM | POA: Diagnosis not present

## 2022-06-02 DIAGNOSIS — F17210 Nicotine dependence, cigarettes, uncomplicated: Secondary | ICD-10-CM | POA: Diagnosis not present

## 2022-06-02 DIAGNOSIS — M5416 Radiculopathy, lumbar region: Secondary | ICD-10-CM | POA: Diagnosis not present

## 2022-06-02 NOTE — Telephone Encounter (Signed)
Multiple calls made to seek clarification on this.  Left message with [facility] to call back to discuss anesthesia need for MRI.  Per [facility] the order must state with anesthesia.

## 2022-06-03 ENCOUNTER — Telehealth: Payer: Self-pay | Admitting: Family Medicine

## 2022-06-03 DIAGNOSIS — Z87440 Personal history of urinary (tract) infections: Secondary | ICD-10-CM | POA: Diagnosis not present

## 2022-06-03 DIAGNOSIS — E119 Type 2 diabetes mellitus without complications: Secondary | ICD-10-CM | POA: Diagnosis not present

## 2022-06-03 DIAGNOSIS — I69351 Hemiplegia and hemiparesis following cerebral infarction affecting right dominant side: Secondary | ICD-10-CM | POA: Diagnosis not present

## 2022-06-03 DIAGNOSIS — E559 Vitamin D deficiency, unspecified: Secondary | ICD-10-CM | POA: Diagnosis not present

## 2022-06-03 DIAGNOSIS — Z7984 Long term (current) use of oral hypoglycemic drugs: Secondary | ICD-10-CM | POA: Diagnosis not present

## 2022-06-03 DIAGNOSIS — J9611 Chronic respiratory failure with hypoxia: Secondary | ICD-10-CM | POA: Diagnosis not present

## 2022-06-03 DIAGNOSIS — G894 Chronic pain syndrome: Secondary | ICD-10-CM | POA: Diagnosis not present

## 2022-06-03 DIAGNOSIS — F17210 Nicotine dependence, cigarettes, uncomplicated: Secondary | ICD-10-CM | POA: Diagnosis not present

## 2022-06-03 DIAGNOSIS — J449 Chronic obstructive pulmonary disease, unspecified: Secondary | ICD-10-CM | POA: Diagnosis not present

## 2022-06-03 DIAGNOSIS — Z7902 Long term (current) use of antithrombotics/antiplatelets: Secondary | ICD-10-CM | POA: Diagnosis not present

## 2022-06-03 DIAGNOSIS — M545 Low back pain, unspecified: Secondary | ICD-10-CM | POA: Diagnosis not present

## 2022-06-03 DIAGNOSIS — J432 Centrilobular emphysema: Secondary | ICD-10-CM | POA: Diagnosis not present

## 2022-06-03 DIAGNOSIS — G43711 Chronic migraine without aura, intractable, with status migrainosus: Secondary | ICD-10-CM | POA: Diagnosis not present

## 2022-06-03 DIAGNOSIS — E785 Hyperlipidemia, unspecified: Secondary | ICD-10-CM | POA: Diagnosis not present

## 2022-06-03 DIAGNOSIS — M25551 Pain in right hip: Secondary | ICD-10-CM | POA: Diagnosis not present

## 2022-06-03 DIAGNOSIS — Z993 Dependence on wheelchair: Secondary | ICD-10-CM | POA: Diagnosis not present

## 2022-06-03 DIAGNOSIS — Z794 Long term (current) use of insulin: Secondary | ICD-10-CM | POA: Diagnosis not present

## 2022-06-03 NOTE — Telephone Encounter (Signed)
Pt is calling in because she spoke with Dr. Kirtland Bouchard on Friday and per pt he said he was sending paperwork over to [facility] in [place] so pt can get an MRI done with anesthesia. Pt says she contacted [facility] and they haven't received a fax. Pt says on the form in the comment section is where Dr. Kirtland Bouchard needs to put "With Anesthesia", per [facility]. Pt is requesting someone from the office give her a call as soon as possible. Please advise.

## 2022-06-03 NOTE — Telephone Encounter (Signed)
Spoke with patient and advised that the ortho office will need to change the order to say with anesthesia.  This is per the imaging center.  Patient will contact ortho and advise to place new order.

## 2022-06-04 ENCOUNTER — Encounter: Payer: Self-pay | Admitting: Vascular Surgery

## 2022-06-04 ENCOUNTER — Ambulatory Visit (INDEPENDENT_AMBULATORY_CARE_PROVIDER_SITE_OTHER): Payer: 59 | Admitting: Vascular Surgery

## 2022-06-04 ENCOUNTER — Ambulatory Visit (HOSPITAL_COMMUNITY)
Admission: RE | Admit: 2022-06-04 | Discharge: 2022-06-04 | Disposition: A | Discharge status: Home / Self Care | Payer: 59 | Source: Ambulatory Visit | Attending: Vascular Surgery | Admitting: Vascular Surgery

## 2022-06-04 VITALS — BP 104/75 | HR 92 | Temp 98.0°F | Resp 20 | Ht 66.0 in | Wt 244.0 lb

## 2022-06-04 DIAGNOSIS — I10 Essential (primary) hypertension: Secondary | ICD-10-CM | POA: Diagnosis not present

## 2022-06-04 DIAGNOSIS — E1151 Type 2 diabetes mellitus with diabetic peripheral angiopathy without gangrene: Secondary | ICD-10-CM | POA: Insufficient documentation

## 2022-06-04 DIAGNOSIS — I70223 Atherosclerosis of native arteries of extremities with rest pain, bilateral legs: Secondary | ICD-10-CM

## 2022-06-04 DIAGNOSIS — F17200 Nicotine dependence, unspecified, uncomplicated: Secondary | ICD-10-CM | POA: Insufficient documentation

## 2022-06-04 DIAGNOSIS — E785 Hyperlipidemia, unspecified: Secondary | ICD-10-CM | POA: Insufficient documentation

## 2022-06-04 DIAGNOSIS — I739 Peripheral vascular disease, unspecified: Secondary | ICD-10-CM | POA: Insufficient documentation

## 2022-06-04 NOTE — Progress Notes (Signed)
Patient ID: [name], female   DOB: July 25, 1965, 57 y.o.   MRN: [id number]  Reason for Consult: Follow-up   Referred by Saralyn Pilar *  Subjective:     HPI:  [name] is a 57 y.o. female history of hyperlipidemia, diabetes, COPD and previous stroke.  She was previously in a motorized wheelchair still uses this but in the past couple weeks has been working with physical therapy and is actually taken some steps and has been able to stand up out of her wheelchair.  She does have weakness of her legs and pain in the right greater than left lower extremity that even occurs at rest.  She does not have any tissue loss or ulceration.  She states that she is smoking 1 pack/day but has decreased recently.  She is on Plavix since the time of her stroke does not have any bruising problems.  Past Medical History:  Diagnosis Date   Anxiety    Cancer (HCC)    a spot on liver and treated    Complication of anesthesia    restless,easily upset   COPD (chronic obstructive pulmonary disease) (HCC)    Depression    Diabetes mellitus without complication (HCC)    Diverticulitis    Fatty liver    GERD (gastroesophageal reflux disease)    Headache(784.0)    migraines   History of kidney stones    Hyperlipidemia    Hypertension    Pneumonia    Restless    Stroke (HCC)    Family History  Problem Relation Age of Onset   Diabetes Mother    Hypertension Mother    Hypertension Father    Past Surgical History:  Procedure Laterality Date   ABDOMINAL HYSTERECTOMY     ACHILLES TENDON LENGTHENING Right 08/18/2017   Procedure: ACHILLES TENDON LENGTHENING;  Surgeon: Myrene Galas, MD;  Location: [facility];  Service: Orthopedics;  Laterality: Right;   ANTERIOR CERVICAL DECOMP/DISCECTOMY FUSION N/A 03/11/2012   Procedure: ANTERIOR CERVICAL DECOMPRESSION/DISCECTOMY FUSION 1 LEVEL;  Surgeon: Cristi Loron, MD;  Location: [facility];  Service: Neurosurgery;  Laterality: N/A;  Cervical  five-six anterior cervical decompression with fusion interbody prothesis plating and bonegraft   BACK SURGERY     EXTERNAL FIXATION LEG Right 08/10/2017   Procedure: EXTERNAL FIXATION ANKLE;  Surgeon: Donato Heinz, MD;  Location: [facility] ORS;  Service: Orthopedics;  Laterality: Right;   EXTERNAL FIXATION REMOVAL Right 08/18/2017   Procedure: REMOVAL EXTERNAL FIXATION LEG;  Surgeon: Myrene Galas, MD;  Location: [facility];  Service: Orthopedics;  Laterality: Right;   EYE SURGERY     HERNIA REPAIR     IR FL GUIDED LOC OF NEEDLE/CATH TIP FOR SPINAL INJECTION LT  12/09/2021   IR FL GUIDED LOC OF NEEDLE/CATH TIP FOR SPINAL INJECTION RT  12/09/2021   JOINT REPLACEMENT     bil knees   ORIF ANKLE FRACTURE Right 08/18/2017   Procedure: OPEN REDUCTION INTERNAL FIXATION (ORIF) ANKLE FRACTURE;  Surgeon: Myrene Galas, MD;  Location[facility];  Service: Orthopedics;  Laterality: Right;   REPLACEMENT TOTAL KNEE BILATERAL      Short Social History:  Social History   Tobacco Use   Smoking status: Every Day    Packs/day: 1.00    Years: 20.00    Additional pack years: 0.00    Total pack years: 20.00    Types: Cigarettes   Smokeless tobacco: Never  Substance Use Topics   Alcohol use: No  Allergies  Allergen Reactions   Tramadol Nausea And Vomiting and Hypertension   Augmentin [Amoxicillin-Pot Clavulanate] Rash    Current Outpatient Medications  Medication Sig Dispense Refill   albuterol (VENTOLIN HFA) 108 (90 Base) MCG/ACT inhaler USE 2 INHALATIONS BY MOUTH EVERY 4 HOURS AS NEEDED FOR WHEEZING  OR SHORTNESS OF BREATH 40.2 g 2   azelastine (ASTELIN) 0.1 % nasal spray Place 2 sprays into both nostrils 2 (two) times daily. 30 mL 12   buPROPion (WELLBUTRIN XL) 150 MG 24 hr tablet Take 1 tablet (150 mg total) by mouth daily. 90 tablet 1   citalopram (CELEXA) 40 MG tablet TAKE 1 TABLET BY MOUTH DAILY 100 tablet 3   clopidogrel (PLAVIX) 75 MG tablet Take 1 tablet (75 mg total) by mouth daily. 90 tablet 1    gabapentin (NEURONTIN) 600 MG tablet TAKE 1 TABLET(600 MG) BY MOUTH FOUR TIMES DAILY 360 tablet 1   IBU 600 MG tablet Take 600 mg by mouth every 12 (twelve) hours as needed.     metFORMIN (GLUCOPHAGE) 1000 MG tablet TAKE 1 TABLET(1000 MG) BY MOUTH TWICE DAILY WITH A MEAL 180 tablet 1   methocarbamol (ROBAXIN) 500 MG tablet Take 1 tablet (500 mg total) by mouth 3 (three) times daily. 90 tablet 5   MOUNJARO 10 MG/0.5ML Pen Inject 10 mg into the skin once a week. 2 mL 2   nystatin (MYCOSTATIN/NYSTOP) powder Apply 1 Application topically 3 (three) times daily as needed (yeast rash intertrigo). 30 g 2   oxyCODONE-acetaminophen (PERCOCET) 10-325 MG tablet Take 1 tablet by mouth every 8 (eight) hours as needed for pain. 90 tablet 0   pantoprazole (PROTONIX) 40 MG tablet Take 1 tablet (40 mg total) by mouth daily. 90 tablet 1   QUEtiapine (SEROQUEL) 100 MG tablet TAKE 1 TABLET BY MOUTH AT  BEDTIME 100 tablet 3   topiramate (TOPAMAX) 100 MG tablet Take 1 tablet (100 mg total) by mouth 2 (two) times daily. 200 tablet 1   VITAMIN D PO Take by mouth.     atorvastatin (LIPITOR) 40 MG tablet Take 1 tablet (40 mg total) by mouth at bedtime. (Patient not taking: Reported on 06/04/2022) 90 tablet 0   No current facility-administered medications for this visit.    Review of Systems  Constitutional:  Constitutional negative. HENT: HENT negative.  Eyes: Eyes negative.  Cardiovascular: Cardiovascular negative.  GI: Gastrointestinal negative.  Musculoskeletal: Positive for leg pain.  Skin: Skin negative.  Neurological: Positive for focal weakness.  Hematologic: Hematologic/lymphatic negative.  Psychiatric: Psychiatric negative.        Objective:  Objective   Vitals:   06/04/22 1543  BP: 104/75  Pulse: 92  Resp: 20  Temp: 98 F (36.7 C)  SpO2: 96%  Weight: 244 lb (110.7 kg)  Height: 5\' 6"  (1.676 m)   Body mass index is 39.38 kg/m.  Physical Exam HENT:     Head: Normocephalic.      Mouth/Throat:     Mouth: Mucous membranes are moist.  Eyes:     Pupils: Pupils are equal, round, and reactive to light.  Cardiovascular:     Pulses:          Radial pulses are 2+ on the right side and 2+ on the left side.       Femoral pulses are 0 on the right side and 0 on the left side.      Popliteal pulses are 0 on the right side and 0 on the left side.  Dorsalis pedis pulses are 0 on the right side and 0 on the left side.       Posterior tibial pulses are 0 on the right side and 0 on the left side.     Comments: Unable to feel femoral pulses may be due to body habitus but cannot appreciate any popliteal or distal pulses Pulmonary:     Effort: Pulmonary effort is normal.  Abdominal:     General: Abdomen is flat.     Palpations: Abdomen is soft.  Musculoskeletal:     Right lower leg: No edema.     Left lower leg: No edema.  Skin:    General: Skin is warm.     Capillary Refill: Capillary refill takes 2 to 3 seconds.  Neurological:     General: No focal deficit present.     Mental Status: She is alert. Mental status is at baseline.  Psychiatric:        Mood and Affect: Mood normal.     Data: ABI Findings:  +---------+------------------+-----+----------+--------+  Right   Rt Pressure (mmHg)IndexWaveform  Comment   +---------+------------------+-----+----------+--------+  Brachial 115                    triphasic           +---------+------------------+-----+----------+--------+  PTA     64                0.56 monophasic          +---------+------------------+-----+----------+--------+  DP      80                0.70 monophasic          +---------+------------------+-----+----------+--------+  Great Toe41                0.36 Abnormal            +---------+------------------+-----+----------+--------+   +---------+------------------+-----+----------+-------+  Left    Lt Pressure (mmHg)IndexWaveform  Comment   +---------+------------------+-----+----------+-------+  Brachial 107                    triphasic          +---------+------------------+-----+----------+-------+  PTA     96                0.83 monophasic         +---------+------------------+-----+----------+-------+  DP      85                0.74 monophasic         +---------+------------------+-----+----------+-------+  Great Toe81                0.70 Abnormal           +---------+------------------+-----+----------+-------+        Right ABIs appear essentially unchanged compared to prior study on  07-24-2021. Left ABIs appear increased compared to prior study on  07-24-2021.    Summary:  Right: Resting right ankle-brachial index indicates moderate right lower  extremity arterial disease.   Left: Resting left ankle-brachial index indicates mild left lower  extremity arterial disease.       Assessment/Plan:    57 year old female with right greater than left lower extremity pain with toe pressure that is dropped to 41 and ABIs 0.7 and monophasic.  She does not have any palpable pulses in the lower extremities.  Discussed with her the need for urgent smoking cessation.  She is working with physical therapist and has begun  to improve her activity even standing from her wheelchair and taking a few steps with significant assistance.  We have discussed that any interventions without smoking cessation would be difficult to maintain patency.  Have also discussed her need to increase her activity level.  Given her lack of femoral pulses will begin with CT angio with bilateral lower extremity runoff and repeat ABIs in 3 months.  She does need to protect her feet and again have total abstinence from tobacco products.  She will follow-up in 3 months with CT angio we can review together at that time.     Maeola Harman MD [facility]

## 2022-06-05 DIAGNOSIS — Z79899 Other long term (current) drug therapy: Secondary | ICD-10-CM | POA: Diagnosis not present

## 2022-06-06 DIAGNOSIS — I69351 Hemiplegia and hemiparesis following cerebral infarction affecting right dominant side: Secondary | ICD-10-CM | POA: Diagnosis not present

## 2022-06-06 DIAGNOSIS — G894 Chronic pain syndrome: Secondary | ICD-10-CM | POA: Diagnosis not present

## 2022-06-06 DIAGNOSIS — J9611 Chronic respiratory failure with hypoxia: Secondary | ICD-10-CM | POA: Diagnosis not present

## 2022-06-06 DIAGNOSIS — Z7984 Long term (current) use of oral hypoglycemic drugs: Secondary | ICD-10-CM | POA: Diagnosis not present

## 2022-06-06 DIAGNOSIS — Z7902 Long term (current) use of antithrombotics/antiplatelets: Secondary | ICD-10-CM | POA: Diagnosis not present

## 2022-06-06 DIAGNOSIS — E785 Hyperlipidemia, unspecified: Secondary | ICD-10-CM | POA: Diagnosis not present

## 2022-06-06 DIAGNOSIS — G43711 Chronic migraine without aura, intractable, with status migrainosus: Secondary | ICD-10-CM | POA: Diagnosis not present

## 2022-06-06 DIAGNOSIS — M545 Low back pain, unspecified: Secondary | ICD-10-CM | POA: Diagnosis not present

## 2022-06-06 DIAGNOSIS — Z87440 Personal history of urinary (tract) infections: Secondary | ICD-10-CM | POA: Diagnosis not present

## 2022-06-06 DIAGNOSIS — F17210 Nicotine dependence, cigarettes, uncomplicated: Secondary | ICD-10-CM | POA: Diagnosis not present

## 2022-06-06 DIAGNOSIS — E119 Type 2 diabetes mellitus without complications: Secondary | ICD-10-CM | POA: Diagnosis not present

## 2022-06-06 DIAGNOSIS — Z794 Long term (current) use of insulin: Secondary | ICD-10-CM | POA: Diagnosis not present

## 2022-06-06 DIAGNOSIS — J432 Centrilobular emphysema: Secondary | ICD-10-CM | POA: Diagnosis not present

## 2022-06-06 DIAGNOSIS — E559 Vitamin D deficiency, unspecified: Secondary | ICD-10-CM | POA: Diagnosis not present

## 2022-06-06 DIAGNOSIS — J449 Chronic obstructive pulmonary disease, unspecified: Secondary | ICD-10-CM | POA: Diagnosis not present

## 2022-06-06 DIAGNOSIS — Z993 Dependence on wheelchair: Secondary | ICD-10-CM | POA: Diagnosis not present

## 2022-06-06 DIAGNOSIS — M25551 Pain in right hip: Secondary | ICD-10-CM | POA: Diagnosis not present

## 2022-06-10 ENCOUNTER — Telehealth: Payer: Self-pay | Admitting: Family Medicine

## 2022-06-10 NOTE — Telephone Encounter (Signed)
Verbal given, also requested Presenter, broadcasting.  Verbal also given.

## 2022-06-10 NOTE — Telephone Encounter (Addendum)
Home Health Verbal Orders - Caller/Agency: Misty Stanley with [facility] Callback Number: [phone] Requesting: OT Frequency: 1X6  Per Misty Stanley ok to leave voice mail, she has a secure number.

## 2022-06-12 ENCOUNTER — Other Ambulatory Visit: Payer: Self-pay

## 2022-06-12 DIAGNOSIS — I739 Peripheral vascular disease, unspecified: Secondary | ICD-10-CM

## 2022-06-17 ENCOUNTER — Telehealth: Payer: Self-pay

## 2022-06-17 DIAGNOSIS — E559 Vitamin D deficiency, unspecified: Secondary | ICD-10-CM | POA: Diagnosis not present

## 2022-06-17 DIAGNOSIS — Z794 Long term (current) use of insulin: Secondary | ICD-10-CM | POA: Diagnosis not present

## 2022-06-17 DIAGNOSIS — J449 Chronic obstructive pulmonary disease, unspecified: Secondary | ICD-10-CM | POA: Diagnosis not present

## 2022-06-17 DIAGNOSIS — J432 Centrilobular emphysema: Secondary | ICD-10-CM | POA: Diagnosis not present

## 2022-06-17 DIAGNOSIS — R531 Weakness: Secondary | ICD-10-CM

## 2022-06-17 DIAGNOSIS — M545 Low back pain, unspecified: Secondary | ICD-10-CM

## 2022-06-17 DIAGNOSIS — Z7902 Long term (current) use of antithrombotics/antiplatelets: Secondary | ICD-10-CM | POA: Diagnosis not present

## 2022-06-17 DIAGNOSIS — F17210 Nicotine dependence, cigarettes, uncomplicated: Secondary | ICD-10-CM | POA: Diagnosis not present

## 2022-06-17 DIAGNOSIS — I69951 Hemiplegia and hemiparesis following unspecified cerebrovascular disease affecting right dominant side: Secondary | ICD-10-CM

## 2022-06-17 DIAGNOSIS — G43711 Chronic migraine without aura, intractable, with status migrainosus: Secondary | ICD-10-CM | POA: Diagnosis not present

## 2022-06-17 DIAGNOSIS — G894 Chronic pain syndrome: Secondary | ICD-10-CM | POA: Diagnosis not present

## 2022-06-17 DIAGNOSIS — M25551 Pain in right hip: Secondary | ICD-10-CM | POA: Diagnosis not present

## 2022-06-17 DIAGNOSIS — I69351 Hemiplegia and hemiparesis following cerebral infarction affecting right dominant side: Secondary | ICD-10-CM | POA: Diagnosis not present

## 2022-06-17 DIAGNOSIS — Z7984 Long term (current) use of oral hypoglycemic drugs: Secondary | ICD-10-CM | POA: Diagnosis not present

## 2022-06-17 DIAGNOSIS — E785 Hyperlipidemia, unspecified: Secondary | ICD-10-CM | POA: Diagnosis not present

## 2022-06-17 DIAGNOSIS — J9611 Chronic respiratory failure with hypoxia: Secondary | ICD-10-CM | POA: Diagnosis not present

## 2022-06-17 DIAGNOSIS — Z993 Dependence on wheelchair: Secondary | ICD-10-CM | POA: Diagnosis not present

## 2022-06-17 DIAGNOSIS — Z87440 Personal history of urinary (tract) infections: Secondary | ICD-10-CM | POA: Diagnosis not present

## 2022-06-17 DIAGNOSIS — E119 Type 2 diabetes mellitus without complications: Secondary | ICD-10-CM | POA: Diagnosis not present

## 2022-06-17 NOTE — Telephone Encounter (Signed)
Copied from CRM [id number]. Topic: Appointment Scheduling - Scheduling Inquiry for Clinic >> Jun 16, 2022  4:39 PM Marlow Baars wrote: Reason for CRM: The patient called in stating she spoke with her insurance and they will only cover 9 visits for her Physical Therapy which would end on 6/27. She is up for reassessment with [facility]. She states she is making good progress and even standing up now. She states she truly needs this authorization from her provider as soon as possible so she could get a more visits for therapy in with [facility] to keep helping with her progress.

## 2022-06-17 NOTE — Addendum Note (Signed)
Addended by: Smitty Cords on: 06/17/2022 05:26 PM   Modules accepted: Orders

## 2022-06-17 NOTE — Telephone Encounter (Signed)
Referral ordered to continue [facility] PT  She may need follow up visit during next phase of treatment for insurance re certification  Saralyn Pilar, DO [facility] 06/17/2022, 5:26 PM

## 2022-06-17 NOTE — Telephone Encounter (Signed)
Okay, does she need verbal authorization or something else?  Saralyn Pilar, DO [facility] 06/17/2022, 4:13 PM

## 2022-06-18 ENCOUNTER — Telehealth: Payer: Self-pay | Admitting: Family Medicine

## 2022-06-18 DIAGNOSIS — Z7984 Long term (current) use of oral hypoglycemic drugs: Secondary | ICD-10-CM | POA: Diagnosis not present

## 2022-06-18 DIAGNOSIS — J9611 Chronic respiratory failure with hypoxia: Secondary | ICD-10-CM | POA: Diagnosis not present

## 2022-06-18 DIAGNOSIS — J432 Centrilobular emphysema: Secondary | ICD-10-CM | POA: Diagnosis not present

## 2022-06-18 DIAGNOSIS — E119 Type 2 diabetes mellitus without complications: Secondary | ICD-10-CM | POA: Diagnosis not present

## 2022-06-18 DIAGNOSIS — F17210 Nicotine dependence, cigarettes, uncomplicated: Secondary | ICD-10-CM | POA: Diagnosis not present

## 2022-06-18 DIAGNOSIS — E559 Vitamin D deficiency, unspecified: Secondary | ICD-10-CM | POA: Diagnosis not present

## 2022-06-18 DIAGNOSIS — E785 Hyperlipidemia, unspecified: Secondary | ICD-10-CM | POA: Diagnosis not present

## 2022-06-18 DIAGNOSIS — I69351 Hemiplegia and hemiparesis following cerebral infarction affecting right dominant side: Secondary | ICD-10-CM | POA: Diagnosis not present

## 2022-06-18 DIAGNOSIS — J449 Chronic obstructive pulmonary disease, unspecified: Secondary | ICD-10-CM | POA: Diagnosis not present

## 2022-06-18 DIAGNOSIS — Z87440 Personal history of urinary (tract) infections: Secondary | ICD-10-CM | POA: Diagnosis not present

## 2022-06-18 DIAGNOSIS — Z993 Dependence on wheelchair: Secondary | ICD-10-CM | POA: Diagnosis not present

## 2022-06-18 DIAGNOSIS — Z794 Long term (current) use of insulin: Secondary | ICD-10-CM | POA: Diagnosis not present

## 2022-06-18 DIAGNOSIS — G894 Chronic pain syndrome: Secondary | ICD-10-CM | POA: Diagnosis not present

## 2022-06-18 DIAGNOSIS — Z7902 Long term (current) use of antithrombotics/antiplatelets: Secondary | ICD-10-CM | POA: Diagnosis not present

## 2022-06-18 DIAGNOSIS — M545 Low back pain, unspecified: Secondary | ICD-10-CM | POA: Diagnosis not present

## 2022-06-18 DIAGNOSIS — G43711 Chronic migraine without aura, intractable, with status migrainosus: Secondary | ICD-10-CM | POA: Diagnosis not present

## 2022-06-18 DIAGNOSIS — M25551 Pain in right hip: Secondary | ICD-10-CM | POA: Diagnosis not present

## 2022-06-18 NOTE — Telephone Encounter (Signed)
Lucita Ferrara called from [facility] called to report that the patient is experiencing urgency. Pam wants a UA called in for her to check for potential UTI   Best contact: [phone] secured VM

## 2022-06-18 NOTE — Telephone Encounter (Signed)
Called and gave her verbal to proceed with UA  Saralyn Pilar, DO [facility] 06/18/2022, 4:35 PM

## 2022-06-19 DIAGNOSIS — F17210 Nicotine dependence, cigarettes, uncomplicated: Secondary | ICD-10-CM | POA: Diagnosis not present

## 2022-06-19 DIAGNOSIS — Z7902 Long term (current) use of antithrombotics/antiplatelets: Secondary | ICD-10-CM | POA: Diagnosis not present

## 2022-06-19 DIAGNOSIS — M545 Low back pain, unspecified: Secondary | ICD-10-CM | POA: Diagnosis not present

## 2022-06-19 DIAGNOSIS — J432 Centrilobular emphysema: Secondary | ICD-10-CM | POA: Diagnosis not present

## 2022-06-19 DIAGNOSIS — Z87440 Personal history of urinary (tract) infections: Secondary | ICD-10-CM | POA: Diagnosis not present

## 2022-06-19 DIAGNOSIS — E559 Vitamin D deficiency, unspecified: Secondary | ICD-10-CM | POA: Diagnosis not present

## 2022-06-19 DIAGNOSIS — J9611 Chronic respiratory failure with hypoxia: Secondary | ICD-10-CM | POA: Diagnosis not present

## 2022-06-19 DIAGNOSIS — J449 Chronic obstructive pulmonary disease, unspecified: Secondary | ICD-10-CM | POA: Diagnosis not present

## 2022-06-19 DIAGNOSIS — E119 Type 2 diabetes mellitus without complications: Secondary | ICD-10-CM | POA: Diagnosis not present

## 2022-06-19 DIAGNOSIS — Z794 Long term (current) use of insulin: Secondary | ICD-10-CM | POA: Diagnosis not present

## 2022-06-19 DIAGNOSIS — G894 Chronic pain syndrome: Secondary | ICD-10-CM | POA: Diagnosis not present

## 2022-06-19 DIAGNOSIS — Z993 Dependence on wheelchair: Secondary | ICD-10-CM | POA: Diagnosis not present

## 2022-06-19 DIAGNOSIS — M25551 Pain in right hip: Secondary | ICD-10-CM | POA: Diagnosis not present

## 2022-06-19 DIAGNOSIS — Z7984 Long term (current) use of oral hypoglycemic drugs: Secondary | ICD-10-CM | POA: Diagnosis not present

## 2022-06-19 DIAGNOSIS — E785 Hyperlipidemia, unspecified: Secondary | ICD-10-CM | POA: Diagnosis not present

## 2022-06-19 DIAGNOSIS — G43711 Chronic migraine without aura, intractable, with status migrainosus: Secondary | ICD-10-CM | POA: Diagnosis not present

## 2022-06-19 DIAGNOSIS — I69351 Hemiplegia and hemiparesis following cerebral infarction affecting right dominant side: Secondary | ICD-10-CM | POA: Diagnosis not present

## 2022-06-20 DIAGNOSIS — G894 Chronic pain syndrome: Secondary | ICD-10-CM | POA: Diagnosis not present

## 2022-06-20 DIAGNOSIS — E119 Type 2 diabetes mellitus without complications: Secondary | ICD-10-CM | POA: Diagnosis not present

## 2022-06-20 DIAGNOSIS — M545 Low back pain, unspecified: Secondary | ICD-10-CM | POA: Diagnosis not present

## 2022-06-20 DIAGNOSIS — J432 Centrilobular emphysema: Secondary | ICD-10-CM | POA: Diagnosis not present

## 2022-06-20 DIAGNOSIS — Z7984 Long term (current) use of oral hypoglycemic drugs: Secondary | ICD-10-CM | POA: Diagnosis not present

## 2022-06-20 DIAGNOSIS — J449 Chronic obstructive pulmonary disease, unspecified: Secondary | ICD-10-CM | POA: Diagnosis not present

## 2022-06-20 DIAGNOSIS — M25551 Pain in right hip: Secondary | ICD-10-CM | POA: Diagnosis not present

## 2022-06-20 DIAGNOSIS — E559 Vitamin D deficiency, unspecified: Secondary | ICD-10-CM | POA: Diagnosis not present

## 2022-06-20 DIAGNOSIS — E785 Hyperlipidemia, unspecified: Secondary | ICD-10-CM | POA: Diagnosis not present

## 2022-06-20 DIAGNOSIS — Z794 Long term (current) use of insulin: Secondary | ICD-10-CM | POA: Diagnosis not present

## 2022-06-20 DIAGNOSIS — I69351 Hemiplegia and hemiparesis following cerebral infarction affecting right dominant side: Secondary | ICD-10-CM | POA: Diagnosis not present

## 2022-06-20 DIAGNOSIS — J9611 Chronic respiratory failure with hypoxia: Secondary | ICD-10-CM | POA: Diagnosis not present

## 2022-06-20 DIAGNOSIS — Z7902 Long term (current) use of antithrombotics/antiplatelets: Secondary | ICD-10-CM | POA: Diagnosis not present

## 2022-06-20 DIAGNOSIS — Z87440 Personal history of urinary (tract) infections: Secondary | ICD-10-CM | POA: Diagnosis not present

## 2022-06-20 DIAGNOSIS — G43711 Chronic migraine without aura, intractable, with status migrainosus: Secondary | ICD-10-CM | POA: Diagnosis not present

## 2022-06-20 DIAGNOSIS — F17210 Nicotine dependence, cigarettes, uncomplicated: Secondary | ICD-10-CM | POA: Diagnosis not present

## 2022-06-20 DIAGNOSIS — Z993 Dependence on wheelchair: Secondary | ICD-10-CM | POA: Diagnosis not present

## 2022-06-25 DIAGNOSIS — J432 Centrilobular emphysema: Secondary | ICD-10-CM | POA: Diagnosis not present

## 2022-06-25 DIAGNOSIS — M545 Low back pain, unspecified: Secondary | ICD-10-CM | POA: Diagnosis not present

## 2022-06-25 DIAGNOSIS — J449 Chronic obstructive pulmonary disease, unspecified: Secondary | ICD-10-CM | POA: Diagnosis not present

## 2022-06-25 DIAGNOSIS — I69351 Hemiplegia and hemiparesis following cerebral infarction affecting right dominant side: Secondary | ICD-10-CM | POA: Diagnosis not present

## 2022-06-25 DIAGNOSIS — Z7902 Long term (current) use of antithrombotics/antiplatelets: Secondary | ICD-10-CM | POA: Diagnosis not present

## 2022-06-25 DIAGNOSIS — Z87440 Personal history of urinary (tract) infections: Secondary | ICD-10-CM | POA: Diagnosis not present

## 2022-06-25 DIAGNOSIS — M25551 Pain in right hip: Secondary | ICD-10-CM | POA: Diagnosis not present

## 2022-06-25 DIAGNOSIS — J9611 Chronic respiratory failure with hypoxia: Secondary | ICD-10-CM | POA: Diagnosis not present

## 2022-06-25 DIAGNOSIS — G43711 Chronic migraine without aura, intractable, with status migrainosus: Secondary | ICD-10-CM | POA: Diagnosis not present

## 2022-06-25 DIAGNOSIS — G894 Chronic pain syndrome: Secondary | ICD-10-CM | POA: Diagnosis not present

## 2022-06-25 DIAGNOSIS — E559 Vitamin D deficiency, unspecified: Secondary | ICD-10-CM | POA: Diagnosis not present

## 2022-06-25 DIAGNOSIS — Z993 Dependence on wheelchair: Secondary | ICD-10-CM | POA: Diagnosis not present

## 2022-06-25 DIAGNOSIS — E785 Hyperlipidemia, unspecified: Secondary | ICD-10-CM | POA: Diagnosis not present

## 2022-06-25 DIAGNOSIS — E119 Type 2 diabetes mellitus without complications: Secondary | ICD-10-CM | POA: Diagnosis not present

## 2022-06-25 DIAGNOSIS — F17210 Nicotine dependence, cigarettes, uncomplicated: Secondary | ICD-10-CM | POA: Diagnosis not present

## 2022-06-25 DIAGNOSIS — Z7984 Long term (current) use of oral hypoglycemic drugs: Secondary | ICD-10-CM | POA: Diagnosis not present

## 2022-06-25 DIAGNOSIS — Z794 Long term (current) use of insulin: Secondary | ICD-10-CM | POA: Diagnosis not present

## 2022-06-26 ENCOUNTER — Other Ambulatory Visit: Payer: Self-pay | Admitting: Family Medicine

## 2022-06-26 DIAGNOSIS — B372 Candidiasis of skin and nail: Secondary | ICD-10-CM

## 2022-06-26 DIAGNOSIS — I6930 Unspecified sequelae of cerebral infarction: Secondary | ICD-10-CM

## 2022-06-26 NOTE — Telephone Encounter (Signed)
Requested medication (s) are due for refill today: historical medication   Requested medication (s) are on the active medication list: yes   Last refill:  plavix- 05/08/22 #90 1 refill, vit D2 - historical medication , nystatin powder- 05/08/22 #30g 2 refills  Future visit scheduled: no   Notes to clinic:  protocol failed last labs 12/11/21, historical medication, vit D, medication not assigned to a protocol nystatin. Do you want to refill Rxs?     Requested Prescriptions  Pending Prescriptions Disp Refills   clopidogrel (PLAVIX) 75 MG tablet [Pharmacy Med Name: clopidogrel 75 mg tablet] 100 tablet 11    Sig: TAKE ONE TABLET BY MOUTH DAILY AT 9AM     Hematology: Antiplatelets - clopidogrel Failed - 06/26/2022 12:29 PM      Failed - HCT in normal range and within 180 days    HCT  Date Value Ref Range Status  12/11/2021 42.0 36.0 - 46.0 % Final  09/22/2013 44.3 35.0 - 47.0 % Final         Failed - HGB in normal range and within 180 days    Hemoglobin  Date Value Ref Range Status  12/11/2021 13.7 12.0 - 15.0 g/dL Final   HGB  Date Value Ref Range Status  09/22/2013 14.6 12.0 - 16.0 g/dL Final         Failed - PLT in normal range and within 180 days    Platelets  Date Value Ref Range Status  12/11/2021 314 150 - 400 K/uL Final   Platelet  Date Value Ref Range Status  09/22/2013 300 150 - 440 x10 3/mm 3 Final         Passed - Cr in normal range and within 360 days    Creat  Date Value Ref Range Status  05/15/2021 0.62 0.50 - 1.03 mg/dL Final   Creatinine, Ser  Date Value Ref Range Status  12/11/2021 0.75 0.44 - 1.00 mg/dL Final         Passed - Valid encounter within last 6 months    Recent Outpatient Visits           1 month ago Chronic pain syndrome   [facility] Smitty Cords, DO   2 months ago Acute cystitis without hematuria   [place] [facility] Smitty Cords, DO   5 months ago  Hemiparesis of right dominant side as late effect of cerebrovascular disease, unspecified cerebrovascular disease type [facility])   [facility] [place], Netta Neat, DO   5 months ago Type 2 diabetes mellitus with other specified complication, without long-term current use of insulin (HCC)   [place] [facility] Delles, Gentry Fitz A, RPH-CPP   6 months ago History of cerebrovascular accident (CVA) with residual deficit    [facility] [place], Netta Neat, DO       Future Appointments             In 2 weeks MacDiarmid, Lorin Picket, MD [facility] [place]             Vitamin D, Ergocalciferol, (DRISDOL) 1.25 MG (50000 UNIT) CAPS capsule [Pharmacy Med Name: ergocalciferol (vitamin D2) 1,250 mcg (50,000 unit) capsule] 4 capsule 11    Sig: TAKE 1 CAPSULE BY MOUTH EVERY WEDNESDAY @9AM      Endocrinology:  Vitamins - Vitamin D Supplementation 2 Failed - 06/26/2022 12:29 PM      Failed - Manual Review: Route  requests for 50,000 IU strength to the provider      Failed - Ca in normal range and within 360 days    Calcium  Date Value Ref Range Status  12/11/2021 8.6 (L) 8.9 - 10.3 mg/dL Final   Calcium, Total  Date Value Ref Range Status  09/22/2013 8.3 (L) 8.5 - 10.1 mg/dL Final   Calcium, Ion  Date Value Ref Range Status  04/07/2017 1.17 1.15 - 1.40 mmol/L Final         Failed - Vitamin D in normal range and within 360 days    Vit D, 1,25-Dihydroxy  Date Value Ref Range Status  08/18/2017 81.1 (H) 19.9 - 79.3 pg/mL Final    Comment:    (NOTE) Performed At: [facility] [address] [place], [place] [id number] Jolene Schimke MD OZ:[id number]    Vit D, 25-Hydroxy  Date Value Ref Range Status  08/18/2017 20.0 (L) 30.0 - 100.0 ng/mL Final    Comment:    (NOTE) Vitamin D deficiency has been defined by the Institute of Medicine and an Endocrine Society practice guideline as a level of  serum 25-OH vitamin D less than 20 ng/mL (1,2). The Endocrine Society went on to further define vitamin D insufficiency as a level between 21 and 29 ng/mL (2). 1. IOM (Institute of Medicine). 2010. Dietary reference   intakes for calcium and D. [place]: The   [employer]. 2. Holick MF, Binkley , Bischoff-Ferrari HA, et al.   Evaluation, treatment, and prevention of vitamin D   deficiency: an Endocrine Society clinical practice   guideline. JCEM. 2011 Jul; 96(7):1911-30. Performed At: [facility] [address] [place], [place] [id number] Jolene Schimke MD KG:[id number]          Passed - Valid encounter within last 12 months    Recent Outpatient Visits           1 month ago Chronic pain syndrome   [facility] [place], Netta Neat, DO   2 months ago Acute cystitis without hematuria   [place] [facility] [place], Netta Neat, DO   5 months ago Hemiparesis of right dominant side as late effect of cerebrovascular disease, unspecified cerebrovascular disease type [facility])   [facility] [place], Netta Neat, DO   5 months ago Type 2 diabetes mellitus with other specified complication, without long-term current use of insulin (HCC)   [place] [facility] Delles, Gentry Fitz A, RPH-CPP   6 months ago History of cerebrovascular accident (CVA) with residual deficit   [facility] [place], Netta Neat, DO       Future Appointments             In 2 weeks MacDiarmid, Lorin Picket, MD [facility] [place]             nystatin (MYCOSTATIN/NYSTOP) powder [Pharmacy Med Name: nystatin 100,000 unit/gram topical powder] 30 g 11    Sig: APPLY 1 APPLICATION TOPICALLY THREE TIMES DAILY AS NEEDED FOR YEAST RASH INTERTRIGO     Off-Protocol Failed - 06/26/2022 12:29 PM      Failed - Medication not assigned to a protocol, review  manually.      Passed - Valid encounter within last 12 months    Recent Outpatient Visits           1 month ago Chronic pain syndrome   [place] [facility] [place], Netta Neat,  DO   2 months ago Acute cystitis without hematuria   [facility] [place], Netta Neat, DO   5 months ago Hemiparesis of right dominant side as late effect of cerebrovascular disease, unspecified cerebrovascular disease type [facility])   [place] [place], Netta Neat, DO   5 months ago Type 2 diabetes mellitus with other specified complication, without long-term current use of insulin [facility])   [place] [facility] Delles, Gentry Fitz A, RPH-CPP   6 months ago History of cerebrovascular accident (CVA) with residual deficit   [facility] [place], Netta Neat, DO       Future Appointments             In 2 weeks MacDiarmid, Lorin Picket, MD [facility]

## 2022-06-27 ENCOUNTER — Other Ambulatory Visit: Payer: Self-pay | Admitting: Family Medicine

## 2022-06-27 ENCOUNTER — Other Ambulatory Visit: Payer: Self-pay

## 2022-06-27 DIAGNOSIS — F17210 Nicotine dependence, cigarettes, uncomplicated: Secondary | ICD-10-CM | POA: Diagnosis not present

## 2022-06-27 DIAGNOSIS — J9611 Chronic respiratory failure with hypoxia: Secondary | ICD-10-CM | POA: Diagnosis not present

## 2022-06-27 DIAGNOSIS — B379 Candidiasis, unspecified: Secondary | ICD-10-CM

## 2022-06-27 DIAGNOSIS — R54 Age-related physical debility: Secondary | ICD-10-CM | POA: Diagnosis not present

## 2022-06-27 DIAGNOSIS — Z794 Long term (current) use of insulin: Secondary | ICD-10-CM | POA: Diagnosis not present

## 2022-06-27 DIAGNOSIS — E119 Type 2 diabetes mellitus without complications: Secondary | ICD-10-CM | POA: Diagnosis not present

## 2022-06-27 DIAGNOSIS — Z993 Dependence on wheelchair: Secondary | ICD-10-CM | POA: Diagnosis not present

## 2022-06-27 DIAGNOSIS — G43711 Chronic migraine without aura, intractable, with status migrainosus: Secondary | ICD-10-CM | POA: Diagnosis not present

## 2022-06-27 DIAGNOSIS — Z7984 Long term (current) use of oral hypoglycemic drugs: Secondary | ICD-10-CM | POA: Diagnosis not present

## 2022-06-27 DIAGNOSIS — I69351 Hemiplegia and hemiparesis following cerebral infarction affecting right dominant side: Secondary | ICD-10-CM | POA: Diagnosis not present

## 2022-06-27 DIAGNOSIS — J449 Chronic obstructive pulmonary disease, unspecified: Secondary | ICD-10-CM | POA: Diagnosis not present

## 2022-06-27 DIAGNOSIS — G894 Chronic pain syndrome: Secondary | ICD-10-CM

## 2022-06-27 DIAGNOSIS — Z7902 Long term (current) use of antithrombotics/antiplatelets: Secondary | ICD-10-CM | POA: Diagnosis not present

## 2022-06-27 DIAGNOSIS — E559 Vitamin D deficiency, unspecified: Secondary | ICD-10-CM | POA: Diagnosis not present

## 2022-06-27 DIAGNOSIS — I639 Cerebral infarction, unspecified: Secondary | ICD-10-CM | POA: Diagnosis not present

## 2022-06-27 DIAGNOSIS — M545 Low back pain, unspecified: Secondary | ICD-10-CM | POA: Diagnosis not present

## 2022-06-27 DIAGNOSIS — Z87440 Personal history of urinary (tract) infections: Secondary | ICD-10-CM | POA: Diagnosis not present

## 2022-06-27 DIAGNOSIS — J432 Centrilobular emphysema: Secondary | ICD-10-CM | POA: Diagnosis not present

## 2022-06-27 DIAGNOSIS — E785 Hyperlipidemia, unspecified: Secondary | ICD-10-CM | POA: Diagnosis not present

## 2022-06-27 DIAGNOSIS — M25551 Pain in right hip: Secondary | ICD-10-CM | POA: Diagnosis not present

## 2022-06-27 NOTE — Telephone Encounter (Signed)
Called pt to triage pt. Prescription request for Diflucan. Pt stated she does not need that filled. Advised pt that another prescription for Percocet was received from [facility]. Pt advised that she is seeing a pain doctor and does not need the Percocet. Advised pt to please call [facility] to advise of the new provider and to cancel requests. Pt verbalized understanding. NT triage not delegated to refill or refuse Percocet. ALso since Diflucan not assigned to a protocol- NT not delegated to refuse med.   Requested Prescriptions  Pending Prescriptions Disp Refills   oxyCODONE-acetaminophen (PERCOCET) 10-325 MG tablet 90 tablet     Sig: Take 1 tablet by mouth every 8 (eight) hours as needed for pain.     Not Delegated - Analgesics:  Opioid Agonist Combinations Failed - 06/27/2022 10:35 AM      Failed - This refill cannot be delegated      Passed - Urine Drug Screen completed in last 360 days      Passed - Valid encounter within last 3 months    Recent Outpatient Visits           1 month ago Chronic pain syndrome   [place] [facility] [place], Netta Neat, DO   2 months ago Acute cystitis without hematuria   [place] [facility] [place], Netta Neat, DO   5 months ago Hemiparesis of right dominant side as late effect of cerebrovascular disease, unspecified cerebrovascular disease type [facility])   [place] [facility] [place], Netta Neat, DO   5 months ago Type 2 diabetes mellitus with other specified complication, without long-term current use of insulin (HCC)   [place] [facility] D[facility], Gentry Fitz A, RPH-CPP   6 months ago History of cerebrovascular accident (CVA) with residual deficit   [place] [place], Netta Neat, DO       Future Appointments             In 2 weeks Alfredo Martinez, MD [facility] [place]             fluconazole (DIFLUCAN)  150 MG tablet 2 tablet     Sig: Take one tablet by mouth on Day 1. Repeat dose 2nd tablet on Day 3.     Off-Protocol Failed - 06/27/2022 10:35 AM      Failed - Medication not assigned to a protocol, review manually.      Passed - Valid encounter within last 12 months    Recent Outpatient Visits           1 month ago Chronic pain syndrome   [place] [place], Netta Neat, DO   2 months ago Acute cystitis without hematuria   [place] [facility] [place], Netta Neat, DO   5 months ago Hemiparesis of right dominant side as late effect of cerebrovascular disease, unspecified cerebrovascular disease type [facility])   [place] [facility] [place], Netta Neat, DO   5 months ago Type 2 diabetes mellitus with other specified complication, without long-term current use of insulin [facility])   [place] [facility] D[place], Gentry Fitz A, RPH-CPP   6 months ago History of cerebrovascular accident (CVA) with residual deficit    [facility] Smitty Cords, DO       Future Appointments             In 2  weeks Alfredo Martinez, MD [facility]

## 2022-06-27 NOTE — Telephone Encounter (Signed)
Medication Refill - Medication: fluconazole (DIFLUCAN) 150 MG tablet , oxyCODONE-acetaminophen (PERCOCET) 10-325 MG tablet   Has the patient contacted their pharmacy? Yes.   No, more refills.   (Agent: If no, request that the patient contact the pharmacy for the refill. If patient does not wish to contact the pharmacy document the reason why and proceed with request.)   Preferred Pharmacy (with phone number or street name):  [facility] [place] [address]  [address] [place] [address]  Phone: [phone] Fax: [phone]  Hours: Not open 24 hours   Has the patient been seen for an appointment in the last year OR does the patient have an upcoming appointment? Yes.    Agent: Please be advised that RX refills may take up to 3 business days. We ask that you follow-up with your pharmacy.

## 2022-06-27 NOTE — Telephone Encounter (Signed)
This encounter was created in error - please disregard.

## 2022-06-29 ENCOUNTER — Ambulatory Visit
Admission: RE | Admit: 2022-06-29 | Discharge: 2022-06-29 | Disposition: A | Discharge status: Home / Self Care | Payer: 59 | Source: Ambulatory Visit | Attending: Physician Assistant | Admitting: Physician Assistant

## 2022-06-29 DIAGNOSIS — M48061 Spinal stenosis, lumbar region without neurogenic claudication: Secondary | ICD-10-CM | POA: Diagnosis not present

## 2022-06-29 DIAGNOSIS — M5126 Other intervertebral disc displacement, lumbar region: Secondary | ICD-10-CM | POA: Diagnosis not present

## 2022-06-29 DIAGNOSIS — M4856XA Collapsed vertebra, not elsewhere classified, lumbar region, initial encounter for fracture: Secondary | ICD-10-CM

## 2022-06-30 DIAGNOSIS — J9611 Chronic respiratory failure with hypoxia: Secondary | ICD-10-CM | POA: Diagnosis not present

## 2022-06-30 DIAGNOSIS — E785 Hyperlipidemia, unspecified: Secondary | ICD-10-CM | POA: Diagnosis not present

## 2022-06-30 DIAGNOSIS — E119 Type 2 diabetes mellitus without complications: Secondary | ICD-10-CM | POA: Diagnosis not present

## 2022-06-30 DIAGNOSIS — Z87440 Personal history of urinary (tract) infections: Secondary | ICD-10-CM | POA: Diagnosis not present

## 2022-06-30 DIAGNOSIS — Z7902 Long term (current) use of antithrombotics/antiplatelets: Secondary | ICD-10-CM | POA: Diagnosis not present

## 2022-06-30 DIAGNOSIS — I69351 Hemiplegia and hemiparesis following cerebral infarction affecting right dominant side: Secondary | ICD-10-CM | POA: Diagnosis not present

## 2022-06-30 DIAGNOSIS — E559 Vitamin D deficiency, unspecified: Secondary | ICD-10-CM | POA: Diagnosis not present

## 2022-06-30 DIAGNOSIS — M25551 Pain in right hip: Secondary | ICD-10-CM | POA: Diagnosis not present

## 2022-06-30 DIAGNOSIS — Z993 Dependence on wheelchair: Secondary | ICD-10-CM | POA: Diagnosis not present

## 2022-06-30 DIAGNOSIS — M545 Low back pain, unspecified: Secondary | ICD-10-CM | POA: Diagnosis not present

## 2022-06-30 DIAGNOSIS — G894 Chronic pain syndrome: Secondary | ICD-10-CM | POA: Diagnosis not present

## 2022-06-30 DIAGNOSIS — J432 Centrilobular emphysema: Secondary | ICD-10-CM | POA: Diagnosis not present

## 2022-06-30 DIAGNOSIS — Z794 Long term (current) use of insulin: Secondary | ICD-10-CM | POA: Diagnosis not present

## 2022-06-30 DIAGNOSIS — Z7984 Long term (current) use of oral hypoglycemic drugs: Secondary | ICD-10-CM | POA: Diagnosis not present

## 2022-06-30 DIAGNOSIS — F17210 Nicotine dependence, cigarettes, uncomplicated: Secondary | ICD-10-CM | POA: Diagnosis not present

## 2022-06-30 DIAGNOSIS — J449 Chronic obstructive pulmonary disease, unspecified: Secondary | ICD-10-CM | POA: Diagnosis not present

## 2022-06-30 DIAGNOSIS — G43711 Chronic migraine without aura, intractable, with status migrainosus: Secondary | ICD-10-CM | POA: Diagnosis not present

## 2022-07-02 ENCOUNTER — Telehealth: Payer: Self-pay

## 2022-07-02 ENCOUNTER — Other Ambulatory Visit: Payer: 59

## 2022-07-02 ENCOUNTER — Telehealth: Payer: Self-pay | Admitting: Family Medicine

## 2022-07-02 NOTE — Telephone Encounter (Signed)
That is no problem. She can have B12 +CBC, however I am unsure how to send orders to [facility].  Perhaps calling and verbal is best way and they may send [place] a fax to sign.  Saralyn Pilar, DO [facility] 07/02/2022, 8:12 AM

## 2022-07-02 NOTE — Telephone Encounter (Signed)
Copied from CRM [id number]. Topic: Referral - Request for Referral >> Jul 01, 2022  4:28 PM Lennox Pippins wrote: Has patient seen PCP for this complaint? No. *If NO, is insurance requiring patient see PCP for this issue before PCP can refer them?  Referral for which specialty: Cardiology Preferred provider/office: Patient did not remember specific name of provider/office, may call back with this information. Reason for referral: Pharmacist from Insurance co. Advised patient that she needed to be referred to a cardiologist

## 2022-07-02 NOTE — Telephone Encounter (Signed)
I am not comfortable using [facility] for Oxycodone rx.  It is not as easy to control pain medication rx through that service. I would not be able to order 90 day amount either.  Saralyn Pilar, DO [facility] 07/02/2022, 5:58 PM

## 2022-07-02 NOTE — Telephone Encounter (Signed)
Verbal order given to draw labs.

## 2022-07-02 NOTE — Telephone Encounter (Signed)
I will need more information to do this referral.  If possible, can you contact them or patient back to please provide reason for referral if we have not seen patient for this issue. Thank you!  Saralyn Pilar, DO [facility] 07/02/2022, 5:59 PM

## 2022-07-02 NOTE — Telephone Encounter (Signed)
Becky with [facility] is calling in because pt switched to their pharmacy and they are requesting a prescription for oxyCODONE-acetaminophen (PERCOCET) 10-325 MG tablet [[id number]]  be sent to their pharmacy. Phone: [phone] [phone]

## 2022-07-02 NOTE — Telephone Encounter (Signed)
Copied from CRM [id number]. Topic: General - Other >> Jul 01, 2022  4:30 PM Lennox Pippins wrote: Reason for CRM: Patient is requesting lab work for B12 & CBC full panel, pharmacist recommended her to have blood work done per her being on gabapentin for so long. Also due to her not being able to get out of the house & trouble being moved with hoyer lift, patient wanted to know if her home health nurse could do labs on Friday when she comes out to her house, and if patient's PCP could send over the lab request to [facility], also patient was checking with [facility] to see if they would be willing to complete this lab work for her as well. Please Advise.

## 2022-07-03 ENCOUNTER — Ambulatory Visit: Payer: Self-pay | Admitting: *Deleted

## 2022-07-03 DIAGNOSIS — I6930 Unspecified sequelae of cerebral infarction: Secondary | ICD-10-CM

## 2022-07-03 DIAGNOSIS — Z7984 Long term (current) use of oral hypoglycemic drugs: Secondary | ICD-10-CM | POA: Diagnosis not present

## 2022-07-03 DIAGNOSIS — M5416 Radiculopathy, lumbar region: Secondary | ICD-10-CM | POA: Diagnosis not present

## 2022-07-03 DIAGNOSIS — G894 Chronic pain syndrome: Secondary | ICD-10-CM | POA: Diagnosis not present

## 2022-07-03 DIAGNOSIS — Z993 Dependence on wheelchair: Secondary | ICD-10-CM | POA: Diagnosis not present

## 2022-07-03 DIAGNOSIS — F17200 Nicotine dependence, unspecified, uncomplicated: Secondary | ICD-10-CM | POA: Diagnosis not present

## 2022-07-03 DIAGNOSIS — Z794 Long term (current) use of insulin: Secondary | ICD-10-CM | POA: Diagnosis not present

## 2022-07-03 DIAGNOSIS — Z79899 Other long term (current) drug therapy: Secondary | ICD-10-CM | POA: Diagnosis not present

## 2022-07-03 DIAGNOSIS — G43711 Chronic migraine without aura, intractable, with status migrainosus: Secondary | ICD-10-CM | POA: Diagnosis not present

## 2022-07-03 DIAGNOSIS — Z87440 Personal history of urinary (tract) infections: Secondary | ICD-10-CM | POA: Diagnosis not present

## 2022-07-03 DIAGNOSIS — E7800 Pure hypercholesterolemia, unspecified: Secondary | ICD-10-CM | POA: Diagnosis not present

## 2022-07-03 DIAGNOSIS — M545 Low back pain, unspecified: Secondary | ICD-10-CM | POA: Diagnosis not present

## 2022-07-03 DIAGNOSIS — M25551 Pain in right hip: Secondary | ICD-10-CM | POA: Diagnosis not present

## 2022-07-03 DIAGNOSIS — M25562 Pain in left knee: Secondary | ICD-10-CM | POA: Diagnosis not present

## 2022-07-03 DIAGNOSIS — E785 Hyperlipidemia, unspecified: Secondary | ICD-10-CM | POA: Diagnosis not present

## 2022-07-03 DIAGNOSIS — J432 Centrilobular emphysema: Secondary | ICD-10-CM | POA: Diagnosis not present

## 2022-07-03 DIAGNOSIS — J9611 Chronic respiratory failure with hypoxia: Secondary | ICD-10-CM | POA: Diagnosis not present

## 2022-07-03 DIAGNOSIS — Z7902 Long term (current) use of antithrombotics/antiplatelets: Secondary | ICD-10-CM | POA: Diagnosis not present

## 2022-07-03 DIAGNOSIS — G8929 Other chronic pain: Secondary | ICD-10-CM | POA: Diagnosis not present

## 2022-07-03 DIAGNOSIS — I69351 Hemiplegia and hemiparesis following cerebral infarction affecting right dominant side: Secondary | ICD-10-CM | POA: Diagnosis not present

## 2022-07-03 DIAGNOSIS — B9620 Unspecified Escherichia coli [E. coli] as the cause of diseases classified elsewhere: Secondary | ICD-10-CM

## 2022-07-03 DIAGNOSIS — F17210 Nicotine dependence, cigarettes, uncomplicated: Secondary | ICD-10-CM | POA: Diagnosis not present

## 2022-07-03 DIAGNOSIS — J449 Chronic obstructive pulmonary disease, unspecified: Secondary | ICD-10-CM | POA: Diagnosis not present

## 2022-07-03 DIAGNOSIS — E119 Type 2 diabetes mellitus without complications: Secondary | ICD-10-CM | POA: Diagnosis not present

## 2022-07-03 DIAGNOSIS — I700 Atherosclerosis of aorta: Secondary | ICD-10-CM

## 2022-07-03 DIAGNOSIS — M25561 Pain in right knee: Secondary | ICD-10-CM | POA: Diagnosis not present

## 2022-07-03 DIAGNOSIS — I739 Peripheral vascular disease, unspecified: Secondary | ICD-10-CM

## 2022-07-03 DIAGNOSIS — E559 Vitamin D deficiency, unspecified: Secondary | ICD-10-CM | POA: Diagnosis not present

## 2022-07-03 DIAGNOSIS — D510 Vitamin B12 deficiency anemia due to intrinsic factor deficiency: Secondary | ICD-10-CM | POA: Diagnosis not present

## 2022-07-03 MED ORDER — SULFAMETHOXAZOLE-TRIMETHOPRIM 800-160 MG PO TABS
1.0000 | ORAL_TABLET | Freq: Two times a day (BID) | ORAL | 0 refills | Status: AC
Start: 2022-07-03 — End: 2022-07-10

## 2022-07-03 NOTE — Telephone Encounter (Signed)
Reason for Disposition . [1] Systolic BP 90-110 AND [2] taking blood pressure medications AND [3] dizzy, lightheaded or weak    No BP meds  Protocols used: Blood Pressure - Low-A-AH

## 2022-07-03 NOTE — Telephone Encounter (Addendum)
  Chief Complaint: BP Low Symptoms: BP 84/60 Frequency: today at pain clinic Pertinent Negatives: Patient denies  Disposition: [] ED /[] Urgent Care (no appt availability in office) / [] Appointment(In office/virtual)/ []  [place] [facility]/ [] Home Care/ [] Refused Recommended Disposition /[] [place] [facility]/ [x]  Follow-up with PCP Additional Notes: Pt placed in 'Call Back' status, is not at home. Will get other BP values Home Health P.T. has noted.  Will CB. States advised to have P.T. monitor BP.  1705: Pt returned call. States BP running from 86/59-122/74.  Most values, low 100s/60-70s   Last check by P.T. on 06/29/22 =107/68.  No HR values.  Reports dizziness "But I'm always dizzy" Mild headache, generalized weakness. No BP meds.  Pt states extremely hard to get to OV. Also reports [facility] nurse drew blood today and RN to come to home Monday.  Care advise provided and ensured pt NT would route to practice for PCPs review. Advised ED for worsening symptoms.  Answer Assessment - Initial Assessment Questions 1. BLOOD PRESSURE: "What is the blood pressure?" "Did you take at least two measurements 5 minutes apart?"     80/64 2. ONSET: "When did you take your blood pressure?"      1 hour ago at pain clinic 3. HOW: "How did you obtain the blood pressure?" (e.g., visiting nurse, automatic home BP monitor)     At pain clinic 4. HISTORY: "Do you have a history of low blood pressure?" "What is your blood pressure normally?"     Fluctuates. 5. MEDICINES: "Are you taking any medications for blood pressure?" If Yes, ask: "Have they been changed recently?"     No 6. PULSE RATE: "Do you know what your pulse rate is?"      no 7. OTHER SYMPTOMS: "Have you been sick recently?" "Have you had a recent injury?"     "Dizzy all the time anyways." Headache  Protocols used: Blood Pressure - Low-A-AH

## 2022-07-03 NOTE — Telephone Encounter (Addendum)
1615: Attempted to reach pt, left VM to call back.  1637:  Second attempt, left VM to call back.

## 2022-07-03 NOTE — Telephone Encounter (Signed)
Attempted to reach patient but no answer.  Mychart message sent to clarify need for referral.

## 2022-07-07 DIAGNOSIS — Z7984 Long term (current) use of oral hypoglycemic drugs: Secondary | ICD-10-CM | POA: Diagnosis not present

## 2022-07-07 DIAGNOSIS — E119 Type 2 diabetes mellitus without complications: Secondary | ICD-10-CM | POA: Diagnosis not present

## 2022-07-07 DIAGNOSIS — Z7902 Long term (current) use of antithrombotics/antiplatelets: Secondary | ICD-10-CM | POA: Diagnosis not present

## 2022-07-07 DIAGNOSIS — Z87440 Personal history of urinary (tract) infections: Secondary | ICD-10-CM | POA: Diagnosis not present

## 2022-07-07 DIAGNOSIS — Z794 Long term (current) use of insulin: Secondary | ICD-10-CM | POA: Diagnosis not present

## 2022-07-07 DIAGNOSIS — M545 Low back pain, unspecified: Secondary | ICD-10-CM | POA: Diagnosis not present

## 2022-07-07 DIAGNOSIS — E785 Hyperlipidemia, unspecified: Secondary | ICD-10-CM | POA: Diagnosis not present

## 2022-07-07 DIAGNOSIS — Z79899 Other long term (current) drug therapy: Secondary | ICD-10-CM | POA: Diagnosis not present

## 2022-07-07 DIAGNOSIS — I69351 Hemiplegia and hemiparesis following cerebral infarction affecting right dominant side: Secondary | ICD-10-CM | POA: Diagnosis not present

## 2022-07-07 DIAGNOSIS — F17210 Nicotine dependence, cigarettes, uncomplicated: Secondary | ICD-10-CM | POA: Diagnosis not present

## 2022-07-07 DIAGNOSIS — J432 Centrilobular emphysema: Secondary | ICD-10-CM | POA: Diagnosis not present

## 2022-07-07 DIAGNOSIS — Z993 Dependence on wheelchair: Secondary | ICD-10-CM | POA: Diagnosis not present

## 2022-07-07 DIAGNOSIS — J9611 Chronic respiratory failure with hypoxia: Secondary | ICD-10-CM | POA: Diagnosis not present

## 2022-07-07 DIAGNOSIS — M25551 Pain in right hip: Secondary | ICD-10-CM | POA: Diagnosis not present

## 2022-07-07 DIAGNOSIS — E559 Vitamin D deficiency, unspecified: Secondary | ICD-10-CM | POA: Diagnosis not present

## 2022-07-07 DIAGNOSIS — J449 Chronic obstructive pulmonary disease, unspecified: Secondary | ICD-10-CM | POA: Diagnosis not present

## 2022-07-07 DIAGNOSIS — G43711 Chronic migraine without aura, intractable, with status migrainosus: Secondary | ICD-10-CM | POA: Diagnosis not present

## 2022-07-07 DIAGNOSIS — G894 Chronic pain syndrome: Secondary | ICD-10-CM | POA: Diagnosis not present

## 2022-07-08 DIAGNOSIS — Z87440 Personal history of urinary (tract) infections: Secondary | ICD-10-CM | POA: Diagnosis not present

## 2022-07-08 DIAGNOSIS — Z794 Long term (current) use of insulin: Secondary | ICD-10-CM | POA: Diagnosis not present

## 2022-07-08 DIAGNOSIS — M545 Low back pain, unspecified: Secondary | ICD-10-CM | POA: Diagnosis not present

## 2022-07-08 DIAGNOSIS — E785 Hyperlipidemia, unspecified: Secondary | ICD-10-CM | POA: Diagnosis not present

## 2022-07-08 DIAGNOSIS — F17210 Nicotine dependence, cigarettes, uncomplicated: Secondary | ICD-10-CM | POA: Diagnosis not present

## 2022-07-08 DIAGNOSIS — Z993 Dependence on wheelchair: Secondary | ICD-10-CM | POA: Diagnosis not present

## 2022-07-08 DIAGNOSIS — J9611 Chronic respiratory failure with hypoxia: Secondary | ICD-10-CM | POA: Diagnosis not present

## 2022-07-08 DIAGNOSIS — Z7902 Long term (current) use of antithrombotics/antiplatelets: Secondary | ICD-10-CM | POA: Diagnosis not present

## 2022-07-08 DIAGNOSIS — J449 Chronic obstructive pulmonary disease, unspecified: Secondary | ICD-10-CM | POA: Diagnosis not present

## 2022-07-08 DIAGNOSIS — G894 Chronic pain syndrome: Secondary | ICD-10-CM | POA: Diagnosis not present

## 2022-07-08 DIAGNOSIS — M25551 Pain in right hip: Secondary | ICD-10-CM | POA: Diagnosis not present

## 2022-07-08 DIAGNOSIS — I69351 Hemiplegia and hemiparesis following cerebral infarction affecting right dominant side: Secondary | ICD-10-CM | POA: Diagnosis not present

## 2022-07-08 DIAGNOSIS — E119 Type 2 diabetes mellitus without complications: Secondary | ICD-10-CM | POA: Diagnosis not present

## 2022-07-08 DIAGNOSIS — Z7984 Long term (current) use of oral hypoglycemic drugs: Secondary | ICD-10-CM | POA: Diagnosis not present

## 2022-07-08 DIAGNOSIS — G43711 Chronic migraine without aura, intractable, with status migrainosus: Secondary | ICD-10-CM | POA: Diagnosis not present

## 2022-07-08 DIAGNOSIS — E559 Vitamin D deficiency, unspecified: Secondary | ICD-10-CM | POA: Diagnosis not present

## 2022-07-08 DIAGNOSIS — J432 Centrilobular emphysema: Secondary | ICD-10-CM | POA: Diagnosis not present

## 2022-07-09 ENCOUNTER — Encounter: Payer: Self-pay | Admitting: Family Medicine

## 2022-07-09 DIAGNOSIS — T3695XA Adverse effect of unspecified systemic antibiotic, initial encounter: Secondary | ICD-10-CM

## 2022-07-09 MED ORDER — FLUCONAZOLE 150 MG PO TABS
ORAL_TABLET | ORAL | 1 refills | Status: DC
Start: 2022-07-09 — End: 2022-07-31

## 2022-07-10 ENCOUNTER — Telehealth: Payer: Self-pay | Admitting: Family Medicine

## 2022-07-10 DIAGNOSIS — M545 Low back pain, unspecified: Secondary | ICD-10-CM | POA: Diagnosis not present

## 2022-07-10 DIAGNOSIS — M25551 Pain in right hip: Secondary | ICD-10-CM | POA: Diagnosis not present

## 2022-07-10 DIAGNOSIS — Z7902 Long term (current) use of antithrombotics/antiplatelets: Secondary | ICD-10-CM | POA: Diagnosis not present

## 2022-07-10 DIAGNOSIS — Z993 Dependence on wheelchair: Secondary | ICD-10-CM | POA: Diagnosis not present

## 2022-07-10 DIAGNOSIS — Z87440 Personal history of urinary (tract) infections: Secondary | ICD-10-CM | POA: Diagnosis not present

## 2022-07-10 DIAGNOSIS — J9611 Chronic respiratory failure with hypoxia: Secondary | ICD-10-CM | POA: Diagnosis not present

## 2022-07-10 DIAGNOSIS — I69351 Hemiplegia and hemiparesis following cerebral infarction affecting right dominant side: Secondary | ICD-10-CM | POA: Diagnosis not present

## 2022-07-10 DIAGNOSIS — Z7984 Long term (current) use of oral hypoglycemic drugs: Secondary | ICD-10-CM | POA: Diagnosis not present

## 2022-07-10 DIAGNOSIS — G894 Chronic pain syndrome: Secondary | ICD-10-CM | POA: Diagnosis not present

## 2022-07-10 DIAGNOSIS — J449 Chronic obstructive pulmonary disease, unspecified: Secondary | ICD-10-CM | POA: Diagnosis not present

## 2022-07-10 DIAGNOSIS — E785 Hyperlipidemia, unspecified: Secondary | ICD-10-CM | POA: Diagnosis not present

## 2022-07-10 DIAGNOSIS — G43711 Chronic migraine without aura, intractable, with status migrainosus: Secondary | ICD-10-CM | POA: Diagnosis not present

## 2022-07-10 DIAGNOSIS — E559 Vitamin D deficiency, unspecified: Secondary | ICD-10-CM | POA: Diagnosis not present

## 2022-07-10 DIAGNOSIS — F17210 Nicotine dependence, cigarettes, uncomplicated: Secondary | ICD-10-CM | POA: Diagnosis not present

## 2022-07-10 DIAGNOSIS — J432 Centrilobular emphysema: Secondary | ICD-10-CM | POA: Diagnosis not present

## 2022-07-10 DIAGNOSIS — Z794 Long term (current) use of insulin: Secondary | ICD-10-CM | POA: Diagnosis not present

## 2022-07-10 DIAGNOSIS — E119 Type 2 diabetes mellitus without complications: Secondary | ICD-10-CM | POA: Diagnosis not present

## 2022-07-10 NOTE — Telephone Encounter (Addendum)
Okay to proceed w/ verbal orders through [facility]  Also, can you please request them to read or re-fax her Vitamin B12 level?  I believe they sent it to [place] but it was with the Urinary test results, and I cannot find it entered into chart. I may have discarded / shred it already and I did not see the B12 result included.  Thank you!!  Saralyn Pilar, DO [facility] 07/10/2022, 5:44 PM

## 2022-07-10 NOTE — Telephone Encounter (Signed)
Thayer Ohm calling from [facility] is calling for verbal orders for continuation for PT.  Frequency- 1 W 9  CB- verbal is ok is VM [phone]

## 2022-07-11 NOTE — Telephone Encounter (Signed)
Thayer Ohm advised.  He is going to have the office fax the results.   Thanks,   -Vernona Rieger

## 2022-07-14 ENCOUNTER — Encounter: Payer: Self-pay | Admitting: Urology

## 2022-07-14 ENCOUNTER — Telehealth: Payer: Self-pay

## 2022-07-14 ENCOUNTER — Ambulatory Visit (INDEPENDENT_AMBULATORY_CARE_PROVIDER_SITE_OTHER): Payer: 59 | Admitting: Urology

## 2022-07-14 DIAGNOSIS — N3944 Nocturnal enuresis: Secondary | ICD-10-CM | POA: Diagnosis not present

## 2022-07-14 DIAGNOSIS — B379 Candidiasis, unspecified: Secondary | ICD-10-CM

## 2022-07-14 DIAGNOSIS — N3946 Mixed incontinence: Secondary | ICD-10-CM

## 2022-07-14 LAB — URINALYSIS, COMPLETE
Bilirubin, UA: NEGATIVE
Glucose, UA: NEGATIVE
Ketones, UA: NEGATIVE
Nitrite, UA: NEGATIVE
Protein,UA: NEGATIVE
RBC, UA: NEGATIVE
Specific Gravity, UA: 1.02 (ref 1.005–1.030)
Urobilinogen, Ur: 0.2 mg/dL (ref 0.2–1.0)
pH, UA: 5.5 (ref 5.0–7.5)

## 2022-07-14 LAB — MICROSCOPIC EXAMINATION: Epithelial Cells (non renal): >10 /hpf — AB (ref 0–10)

## 2022-07-14 MED ORDER — TRIMETHOPRIM 100 MG PO TABS
100.0000 mg | ORAL_TABLET | Freq: Every day | ORAL | 11 refills | Status: DC
Start: 2022-07-14 — End: 2022-09-08

## 2022-07-14 NOTE — Addendum Note (Signed)
Addended by: Consuella Lose on: 07/14/2022 03:39 PM   Modules accepted: Orders

## 2022-07-14 NOTE — Patient Instructions (Signed)

## 2022-07-14 NOTE — Addendum Note (Signed)
Addended by: Consuella Lose on: 07/14/2022 04:23 PM   Modules accepted: Orders

## 2022-07-14 NOTE — Progress Notes (Signed)
07/14/2022 3:15 PM   [name] 1965-05-23 [id number]  Referring provider: Smitty Cords, DO [address] [place],  [place] [address]  Chief Complaint  Patient presents with   New Patient (Initial Visit)    HPI: I was consulted to assess the patient's urinary incontinence.  She has urge incontinence.  She sometimes leaks with coughing sneezing sometimes bending lifting.  She has high-volume bedwetting.  She soaks 5 pads a day.  She voids every 2-3 hours and has no nocturia  She had a stroke 11 years ago.  She takes a shot for diabetes which is now well-controlled.  She has had neck surgery and low back surgery.  She has had a hysterectomy.  She failed oxybutynin  She thinks he gets 4 bladder infections a year with pressure and burning that respond favorably antibiotics  She has to use a wheelchair because of the stroke.  She now can stand but cannot walk  She had a normal CT scan in 2023 and a positive urine culture April 2024 #2023  No history of kidney stones or bladder surgery   PMH: Past Medical History:  Diagnosis Date   Anxiety    Cancer (HCC)    a spot on liver and treated    Complication of anesthesia    restless,easily upset   COPD (chronic obstructive pulmonary disease) (HCC)    Depression    Diabetes mellitus without complication (HCC)    Diverticulitis    Fatty liver    GERD (gastroesophageal reflux disease)    Headache(784.0)    migraines   History of kidney stones    Hyperlipidemia    Hypertension    Pneumonia    Restless    Stroke [facility])     Surgical History: Past Surgical History:  Procedure Laterality Date   ABDOMINAL HYSTERECTOMY     ACHILLES TENDON LENGTHENING Right 08/18/2017   Procedure: ACHILLES TENDON LENGTHENING;  Surgeon: Myrene Galas, MD;  Location: [facility] OR;  Service: Orthopedics;  Laterality: Right;   ANTERIOR CERVICAL DECOMP/DISCECTOMY FUSION N/A 03/11/2012   Procedure: ANTERIOR CERVICAL DECOMPRESSION/DISCECTOMY FUSION 1  LEVEL;  Surgeon: Cristi Loron, MD;  Location: [facility] NEURO ORS;  Service: Neurosurgery;  Laterality: N/A;  Cervical five-six anterior cervical decompression with fusion interbody prothesis plating and bonegraft   BACK SURGERY     EXTERNAL FIXATION LEG Right 08/10/2017   Procedure: EXTERNAL FIXATION ANKLE;  Surgeon: Donato Heinz, MD;  Location: [facility] ORS;  Service: Orthopedics;  Laterality: Right;   EXTERNAL FIXATION REMOVAL Right 08/18/2017   Procedure: REMOVAL EXTERNAL FIXATION LEG;  Surgeon: Myrene Galas, MD;  Location: [facility] OR;  Service: Orthopedics;  Laterality: Right;   EYE SURGERY     HERNIA REPAIR     IR FL GUIDED LOC OF NEEDLE/CATH TIP FOR SPINAL INJECTION LT  12/09/2021   IR FL GUIDED LOC OF NEEDLE/CATH TIP FOR SPINAL INJECTION RT  12/09/2021   JOINT REPLACEMENT     bil knees   ORIF ANKLE FRACTURE Right 08/18/2017   Procedure: OPEN REDUCTION INTERNAL FIXATION (ORIF) ANKLE FRACTURE;  Surgeon: Myrene Galas, MD;  Location: [facility] OR;  Service: Orthopedics;  Laterality: Right;   REPLACEMENT TOTAL KNEE BILATERAL      Home Medications:  Allergies as of 07/14/2022       Reactions   Tramadol Nausea And Vomiting, Hypertension   Augmentin [amoxicillin-pot Clavulanate] Rash        Medication List        Accurate as of July 14, 2022  3:15 PM. If you have any questions, ask your nurse or doctor.          albuterol 108 (90 Base) MCG/ACT inhaler Commonly known as: VENTOLIN HFA USE 2 INHALATIONS BY MOUTH EVERY 4 HOURS AS NEEDED FOR WHEEZING  OR SHORTNESS OF BREATH   atorvastatin 40 MG tablet Commonly known as: LIPITOR Take 1 tablet (40 mg total) by mouth at bedtime.   azelastine 0.1 % nasal spray Commonly known as: ASTELIN Place 2 sprays into both nostrils 2 (two) times daily.   buPROPion 150 MG 24 hr tablet Commonly known as: WELLBUTRIN XL Take 1 tablet (150 mg total) by mouth daily.   citalopram 40 MG tablet Commonly known as: CELEXA TAKE 1 TABLET BY MOUTH DAILY    clopidogrel 75 MG tablet Commonly known as: PLAVIX TAKE ONE TABLET BY MOUTH DAILY AT 9AM   fluconazole 150 MG tablet Commonly known as: DIFLUCAN Take one tablet by mouth on Day 1. Repeat dose 2nd tablet on Day 3.   gabapentin 600 MG tablet Commonly known as: NEURONTIN TAKE 1 TABLET(600 MG) BY MOUTH FOUR TIMES DAILY   IBU 600 MG tablet Generic drug: ibuprofen Take 600 mg by mouth every 12 (twelve) hours as needed.   metFORMIN 1000 MG tablet Commonly known as: GLUCOPHAGE TAKE 1 TABLET(1000 MG) BY MOUTH TWICE DAILY WITH A MEAL   methocarbamol 500 MG tablet Commonly known as: ROBAXIN Take 1 tablet (500 mg total) by mouth 3 (three) times daily.   Mounjaro 10 MG/0.5ML Pen Generic drug: tirzepatide Inject 10 mg into the skin once a week.   nystatin powder Commonly known as: MYCOSTATIN/NYSTOP APPLY 1 APPLICATION TOPICALLY THREE TIMES DAILY AS NEEDED FOR YEAST RASH INTERTRIGO   oxyCODONE-acetaminophen 10-325 MG tablet Commonly known as: PERCOCET Take 1 tablet by mouth every 8 (eight) hours as needed for pain.   pantoprazole 40 MG tablet Commonly known as: PROTONIX Take 1 tablet (40 mg total) by mouth daily.   QUEtiapine 100 MG tablet Commonly known as: SEROQUEL TAKE 1 TABLET BY MOUTH AT  BEDTIME   topiramate 100 MG tablet Commonly known as: TOPAMAX Take 1 tablet (100 mg total) by mouth 2 (two) times daily.   Vitamin D (Ergocalciferol) 1.25 MG (50000 UNIT) Caps capsule Commonly known as: DRISDOL TAKE 1 CAPSULE BY MOUTH EVERY WEDNESDAY @9AM    VITAMIN D PO Take by mouth.        Allergies:  Allergies  Allergen Reactions   Tramadol Nausea And Vomiting and Hypertension   Augmentin [Amoxicillin-Pot Clavulanate] Rash    Family History: Family History  Problem Relation Age of Onset   Diabetes Mother    Hypertension Mother    Hypertension Father     Social History:  reports that she has been smoking cigarettes. She has a 20.00 pack-year smoking history. She  has never used smokeless tobacco. She reports current drug use. Drug: Marijuana. She reports that she does not drink alcohol.  ROS:                                        Physical Exam: There were no vitals taken for this visit.  Constitutional:  Alert and oriented, No acute distress. HEENT: [place] AT, moist mucus membranes.  Trachea midline, no masses. Cardiovascular: No clubbing, cyanosis, or edema. Respiratory: Normal respiratory effort, no increased work of breathing. GI: Abdomen is soft, nontender, nondistended, no abdominal  masses GU: Well supported bladder neck.  Her urethra was almost hidden.  No stress incontinence with a modest cough.  No prolapse Skin: No rashes, bruises or suspicious lesions. Lymph: No cervical or inguinal adenopathy. Neurologic: Grossly intact, no focal deficits, moving all 4 extremities. Psychiatric: Normal mood and affect.  Laboratory Data: Lab Results  Component Value Date   WBC 8.7 12/11/2021   HGB 13.7 12/11/2021   HCT 42.0 12/11/2021   MCV 81.6 12/11/2021   PLT 314 12/11/2021    Lab Results  Component Value Date   CREATININE 0.75 12/11/2021    No results found for: "PSA"  No results found for: "TESTOSTERONE"  Lab Results  Component Value Date   HGBA1C 5.2 05/06/2022    Urinalysis    Component Value Date/Time   COLORURINE YELLOW (A) 12/08/2021 2343   APPEARANCEUR HAZY (A) 12/08/2021 2343   APPEARANCEUR Clear 09/23/2013 0154   LABSPEC >1.046 (H) 12/08/2021 2343   LABSPEC 1.030 09/23/2013 0154   PHURINE 5.0 12/08/2021 2343   GLUCOSEU NEGATIVE 12/08/2021 2343   GLUCOSEU >=500 09/23/2013 0154   HGBUR NEGATIVE 12/08/2021 2343   BILIRUBINUR Neg 04/21/2022 1445   BILIRUBINUR Negative 09/23/2013 0154   KETONESUR 80 (A) 12/08/2021 2343   PROTEINUR Positive (A) 04/21/2022 1445   PROTEINUR 30 (A) 12/08/2021 2343   UROBILINOGEN 0.2 04/21/2022 1445   UROBILINOGEN 0.2 03/11/2012 1234   NITRITE Neg 04/21/2022 1445    NITRITE NEGATIVE 12/08/2021 2343   LEUKOCYTESUR Moderate (2+) (A) 04/21/2022 1445   LEUKOCYTESUR TRACE (A) 12/08/2021 2343   LEUKOCYTESUR Negative 09/23/2013 0154    Pertinent Imaging: Cath urine reviewed and sent for culture.  Chart reviewed  Assessment & Plan: Patient has mixed incontinence and high-volume bedwetting.  She is in a wheelchair and has medical comorbidities.  Transportation for urodynamics may be an issue due to functional mobility but we can discuss this at a future date.  I like to put her on trimethoprim 100 mg 30 x 11 and have her come back for cystoscopy.  Will need to have reasonable treatment goals especially with immobility  1. Nocturnal enuresis  - Urinalysis, Complete   No follow-ups on file.  Martina Sinner, MD  [facility] [address], [address] [place], [place] [address] [phone]

## 2022-07-14 NOTE — Telephone Encounter (Signed)
Patient states she gets yeast infections after been on antibiotics. She is suppose to be on the Trimethoprim 1 every day. How should she proceed?

## 2022-07-15 DIAGNOSIS — J449 Chronic obstructive pulmonary disease, unspecified: Secondary | ICD-10-CM | POA: Diagnosis not present

## 2022-07-15 DIAGNOSIS — J432 Centrilobular emphysema: Secondary | ICD-10-CM | POA: Diagnosis not present

## 2022-07-15 DIAGNOSIS — Z993 Dependence on wheelchair: Secondary | ICD-10-CM | POA: Diagnosis not present

## 2022-07-15 DIAGNOSIS — E559 Vitamin D deficiency, unspecified: Secondary | ICD-10-CM | POA: Diagnosis not present

## 2022-07-15 DIAGNOSIS — G894 Chronic pain syndrome: Secondary | ICD-10-CM | POA: Diagnosis not present

## 2022-07-15 DIAGNOSIS — M25551 Pain in right hip: Secondary | ICD-10-CM | POA: Diagnosis not present

## 2022-07-15 DIAGNOSIS — E785 Hyperlipidemia, unspecified: Secondary | ICD-10-CM | POA: Diagnosis not present

## 2022-07-15 DIAGNOSIS — G43711 Chronic migraine without aura, intractable, with status migrainosus: Secondary | ICD-10-CM | POA: Diagnosis not present

## 2022-07-15 DIAGNOSIS — Z7902 Long term (current) use of antithrombotics/antiplatelets: Secondary | ICD-10-CM | POA: Diagnosis not present

## 2022-07-15 DIAGNOSIS — M545 Low back pain, unspecified: Secondary | ICD-10-CM | POA: Diagnosis not present

## 2022-07-15 DIAGNOSIS — J9611 Chronic respiratory failure with hypoxia: Secondary | ICD-10-CM | POA: Diagnosis not present

## 2022-07-15 DIAGNOSIS — E119 Type 2 diabetes mellitus without complications: Secondary | ICD-10-CM | POA: Diagnosis not present

## 2022-07-15 DIAGNOSIS — Z79891 Long term (current) use of opiate analgesic: Secondary | ICD-10-CM | POA: Diagnosis not present

## 2022-07-15 DIAGNOSIS — F17210 Nicotine dependence, cigarettes, uncomplicated: Secondary | ICD-10-CM | POA: Diagnosis not present

## 2022-07-15 DIAGNOSIS — I69351 Hemiplegia and hemiparesis following cerebral infarction affecting right dominant side: Secondary | ICD-10-CM | POA: Diagnosis not present

## 2022-07-15 DIAGNOSIS — Z7984 Long term (current) use of oral hypoglycemic drugs: Secondary | ICD-10-CM | POA: Diagnosis not present

## 2022-07-15 DIAGNOSIS — N390 Urinary tract infection, site not specified: Secondary | ICD-10-CM | POA: Diagnosis not present

## 2022-07-15 MED ORDER — FLUCONAZOLE 100 MG PO TABS
ORAL_TABLET | ORAL | 1 refills | Status: DC
Start: 2022-07-15 — End: 2022-07-31

## 2022-07-15 NOTE — Telephone Encounter (Signed)
Patient called back and advised of the message

## 2022-07-15 NOTE — Telephone Encounter (Signed)
Deanna Martinez, MD  You41 minutes ago (1:23 PM)    The chance of yeast infection is approximately 1% You can call in Diflucan 100 mg daily for 3 days with 1 refill if she wants to keep it just in case as a prescription  Left message for [name], patient's husband to call back to discuss, ok per DPR on file. I did call her son also no answer. RX sent to the pharmacy already.

## 2022-07-16 DIAGNOSIS — Z993 Dependence on wheelchair: Secondary | ICD-10-CM | POA: Diagnosis not present

## 2022-07-16 DIAGNOSIS — J432 Centrilobular emphysema: Secondary | ICD-10-CM | POA: Diagnosis not present

## 2022-07-16 DIAGNOSIS — Z7902 Long term (current) use of antithrombotics/antiplatelets: Secondary | ICD-10-CM | POA: Diagnosis not present

## 2022-07-16 DIAGNOSIS — M545 Low back pain, unspecified: Secondary | ICD-10-CM | POA: Diagnosis not present

## 2022-07-16 DIAGNOSIS — J449 Chronic obstructive pulmonary disease, unspecified: Secondary | ICD-10-CM | POA: Diagnosis not present

## 2022-07-16 DIAGNOSIS — J9611 Chronic respiratory failure with hypoxia: Secondary | ICD-10-CM | POA: Diagnosis not present

## 2022-07-16 DIAGNOSIS — G894 Chronic pain syndrome: Secondary | ICD-10-CM | POA: Diagnosis not present

## 2022-07-16 DIAGNOSIS — I69351 Hemiplegia and hemiparesis following cerebral infarction affecting right dominant side: Secondary | ICD-10-CM | POA: Diagnosis not present

## 2022-07-16 DIAGNOSIS — Z79891 Long term (current) use of opiate analgesic: Secondary | ICD-10-CM | POA: Diagnosis not present

## 2022-07-16 DIAGNOSIS — E119 Type 2 diabetes mellitus without complications: Secondary | ICD-10-CM | POA: Diagnosis not present

## 2022-07-16 DIAGNOSIS — N390 Urinary tract infection, site not specified: Secondary | ICD-10-CM | POA: Diagnosis not present

## 2022-07-16 DIAGNOSIS — E559 Vitamin D deficiency, unspecified: Secondary | ICD-10-CM | POA: Diagnosis not present

## 2022-07-16 DIAGNOSIS — G43711 Chronic migraine without aura, intractable, with status migrainosus: Secondary | ICD-10-CM | POA: Diagnosis not present

## 2022-07-16 DIAGNOSIS — Z7984 Long term (current) use of oral hypoglycemic drugs: Secondary | ICD-10-CM | POA: Diagnosis not present

## 2022-07-16 DIAGNOSIS — E785 Hyperlipidemia, unspecified: Secondary | ICD-10-CM | POA: Diagnosis not present

## 2022-07-16 DIAGNOSIS — M25551 Pain in right hip: Secondary | ICD-10-CM | POA: Diagnosis not present

## 2022-07-16 DIAGNOSIS — F17210 Nicotine dependence, cigarettes, uncomplicated: Secondary | ICD-10-CM | POA: Diagnosis not present

## 2022-07-17 LAB — CULTURE, URINE COMPREHENSIVE

## 2022-07-18 DIAGNOSIS — M4856XA Collapsed vertebra, not elsewhere classified, lumbar region, initial encounter for fracture: Secondary | ICD-10-CM | POA: Diagnosis not present

## 2022-07-21 DIAGNOSIS — E559 Vitamin D deficiency, unspecified: Secondary | ICD-10-CM | POA: Diagnosis not present

## 2022-07-21 DIAGNOSIS — Z993 Dependence on wheelchair: Secondary | ICD-10-CM | POA: Diagnosis not present

## 2022-07-21 DIAGNOSIS — I69351 Hemiplegia and hemiparesis following cerebral infarction affecting right dominant side: Secondary | ICD-10-CM | POA: Diagnosis not present

## 2022-07-21 DIAGNOSIS — G43711 Chronic migraine without aura, intractable, with status migrainosus: Secondary | ICD-10-CM | POA: Diagnosis not present

## 2022-07-21 DIAGNOSIS — N390 Urinary tract infection, site not specified: Secondary | ICD-10-CM | POA: Diagnosis not present

## 2022-07-21 DIAGNOSIS — Z7984 Long term (current) use of oral hypoglycemic drugs: Secondary | ICD-10-CM | POA: Diagnosis not present

## 2022-07-21 DIAGNOSIS — J9611 Chronic respiratory failure with hypoxia: Secondary | ICD-10-CM | POA: Diagnosis not present

## 2022-07-21 DIAGNOSIS — J449 Chronic obstructive pulmonary disease, unspecified: Secondary | ICD-10-CM | POA: Diagnosis not present

## 2022-07-21 DIAGNOSIS — E785 Hyperlipidemia, unspecified: Secondary | ICD-10-CM | POA: Diagnosis not present

## 2022-07-21 DIAGNOSIS — Z7902 Long term (current) use of antithrombotics/antiplatelets: Secondary | ICD-10-CM | POA: Diagnosis not present

## 2022-07-21 DIAGNOSIS — M25551 Pain in right hip: Secondary | ICD-10-CM | POA: Diagnosis not present

## 2022-07-21 DIAGNOSIS — M545 Low back pain, unspecified: Secondary | ICD-10-CM | POA: Diagnosis not present

## 2022-07-21 DIAGNOSIS — F17210 Nicotine dependence, cigarettes, uncomplicated: Secondary | ICD-10-CM | POA: Diagnosis not present

## 2022-07-21 DIAGNOSIS — J432 Centrilobular emphysema: Secondary | ICD-10-CM | POA: Diagnosis not present

## 2022-07-21 DIAGNOSIS — E119 Type 2 diabetes mellitus without complications: Secondary | ICD-10-CM | POA: Diagnosis not present

## 2022-07-21 DIAGNOSIS — Z79891 Long term (current) use of opiate analgesic: Secondary | ICD-10-CM | POA: Diagnosis not present

## 2022-07-21 DIAGNOSIS — G894 Chronic pain syndrome: Secondary | ICD-10-CM | POA: Diagnosis not present

## 2022-07-22 DIAGNOSIS — Z993 Dependence on wheelchair: Secondary | ICD-10-CM | POA: Diagnosis not present

## 2022-07-22 DIAGNOSIS — E785 Hyperlipidemia, unspecified: Secondary | ICD-10-CM | POA: Diagnosis not present

## 2022-07-22 DIAGNOSIS — J449 Chronic obstructive pulmonary disease, unspecified: Secondary | ICD-10-CM | POA: Diagnosis not present

## 2022-07-22 DIAGNOSIS — Z7902 Long term (current) use of antithrombotics/antiplatelets: Secondary | ICD-10-CM | POA: Diagnosis not present

## 2022-07-22 DIAGNOSIS — N390 Urinary tract infection, site not specified: Secondary | ICD-10-CM | POA: Diagnosis not present

## 2022-07-22 DIAGNOSIS — Z7984 Long term (current) use of oral hypoglycemic drugs: Secondary | ICD-10-CM | POA: Diagnosis not present

## 2022-07-22 DIAGNOSIS — F17210 Nicotine dependence, cigarettes, uncomplicated: Secondary | ICD-10-CM | POA: Diagnosis not present

## 2022-07-22 DIAGNOSIS — M545 Low back pain, unspecified: Secondary | ICD-10-CM | POA: Diagnosis not present

## 2022-07-22 DIAGNOSIS — E119 Type 2 diabetes mellitus without complications: Secondary | ICD-10-CM | POA: Diagnosis not present

## 2022-07-22 DIAGNOSIS — M25551 Pain in right hip: Secondary | ICD-10-CM | POA: Diagnosis not present

## 2022-07-22 DIAGNOSIS — J432 Centrilobular emphysema: Secondary | ICD-10-CM | POA: Diagnosis not present

## 2022-07-22 DIAGNOSIS — Z79891 Long term (current) use of opiate analgesic: Secondary | ICD-10-CM | POA: Diagnosis not present

## 2022-07-22 DIAGNOSIS — G894 Chronic pain syndrome: Secondary | ICD-10-CM | POA: Diagnosis not present

## 2022-07-22 DIAGNOSIS — J9611 Chronic respiratory failure with hypoxia: Secondary | ICD-10-CM | POA: Diagnosis not present

## 2022-07-22 DIAGNOSIS — E559 Vitamin D deficiency, unspecified: Secondary | ICD-10-CM | POA: Diagnosis not present

## 2022-07-22 DIAGNOSIS — I69351 Hemiplegia and hemiparesis following cerebral infarction affecting right dominant side: Secondary | ICD-10-CM | POA: Diagnosis not present

## 2022-07-22 DIAGNOSIS — G43711 Chronic migraine without aura, intractable, with status migrainosus: Secondary | ICD-10-CM | POA: Diagnosis not present

## 2022-07-23 ENCOUNTER — Telehealth: Payer: Self-pay | Admitting: Family Medicine

## 2022-07-23 NOTE — Telephone Encounter (Signed)
I called and spoke with Karen Kitchens and also [facility] to give the verbal order per her provider.

## 2022-07-23 NOTE — Telephone Encounter (Addendum)
Home Health Verbal Orders - Caller/Agency: Judithann Sauger social worker for [facility] - Verbal Order   Callback Number: [phone]  Requesting Social Services  Frequency: 1 additional visit

## 2022-07-23 NOTE — Telephone Encounter (Signed)
Okay to proceed w/ verbal orders  Saralyn Pilar, DO [facility] 07/23/2022, 12:49 PM

## 2022-07-25 ENCOUNTER — Ambulatory Visit: Payer: Self-pay

## 2022-07-25 DIAGNOSIS — M545 Low back pain, unspecified: Secondary | ICD-10-CM | POA: Diagnosis not present

## 2022-07-25 DIAGNOSIS — E559 Vitamin D deficiency, unspecified: Secondary | ICD-10-CM | POA: Diagnosis not present

## 2022-07-25 DIAGNOSIS — Z79891 Long term (current) use of opiate analgesic: Secondary | ICD-10-CM | POA: Diagnosis not present

## 2022-07-25 DIAGNOSIS — E119 Type 2 diabetes mellitus without complications: Secondary | ICD-10-CM | POA: Diagnosis not present

## 2022-07-25 DIAGNOSIS — J449 Chronic obstructive pulmonary disease, unspecified: Secondary | ICD-10-CM | POA: Diagnosis not present

## 2022-07-25 DIAGNOSIS — J9611 Chronic respiratory failure with hypoxia: Secondary | ICD-10-CM | POA: Diagnosis not present

## 2022-07-25 DIAGNOSIS — I69351 Hemiplegia and hemiparesis following cerebral infarction affecting right dominant side: Secondary | ICD-10-CM | POA: Diagnosis not present

## 2022-07-25 DIAGNOSIS — G43711 Chronic migraine without aura, intractable, with status migrainosus: Secondary | ICD-10-CM | POA: Diagnosis not present

## 2022-07-25 DIAGNOSIS — Z7902 Long term (current) use of antithrombotics/antiplatelets: Secondary | ICD-10-CM | POA: Diagnosis not present

## 2022-07-25 DIAGNOSIS — N3000 Acute cystitis without hematuria: Secondary | ICD-10-CM

## 2022-07-25 DIAGNOSIS — J432 Centrilobular emphysema: Secondary | ICD-10-CM | POA: Diagnosis not present

## 2022-07-25 DIAGNOSIS — Z7984 Long term (current) use of oral hypoglycemic drugs: Secondary | ICD-10-CM | POA: Diagnosis not present

## 2022-07-25 DIAGNOSIS — B379 Candidiasis, unspecified: Secondary | ICD-10-CM

## 2022-07-25 DIAGNOSIS — Z993 Dependence on wheelchair: Secondary | ICD-10-CM | POA: Diagnosis not present

## 2022-07-25 DIAGNOSIS — F17210 Nicotine dependence, cigarettes, uncomplicated: Secondary | ICD-10-CM | POA: Diagnosis not present

## 2022-07-25 DIAGNOSIS — G894 Chronic pain syndrome: Secondary | ICD-10-CM | POA: Diagnosis not present

## 2022-07-25 DIAGNOSIS — E785 Hyperlipidemia, unspecified: Secondary | ICD-10-CM | POA: Diagnosis not present

## 2022-07-25 DIAGNOSIS — M25551 Pain in right hip: Secondary | ICD-10-CM | POA: Diagnosis not present

## 2022-07-25 DIAGNOSIS — N390 Urinary tract infection, site not specified: Secondary | ICD-10-CM | POA: Diagnosis not present

## 2022-07-25 NOTE — Telephone Encounter (Signed)
  Chief Complaint: Request for UA order Symptoms: S/s of UA , Burning with urination, no fever Frequency: today Pertinent Negatives: Patient denies fever Disposition: [] ED /[] Urgent Care (no appt availability in office) / [] Appointment(In office/virtual)/ []  [place] [facility]/ [] Home Care/ [] Refused Recommended Disposition /[] [place] [facility]/ [x]  Follow-up with PCP Additional Notes: Call from [place] at [facility]. She is requesting a verbal order for a UA for pt. Pt has UTI's frequently and is having burning with urination.  Pt would like to give sample to [place] on Monday to take to lab for UA.  Pt was unable to provide a sample today. Connie's number is [phone] Discussed concerning s/s to watch for over the weekend.    Reason for Disposition  [1] Follow-up call from patient regarding patient's clinical status AND [2] information NON-URGENT  Answer Assessment - Initial Assessment Questions 1. REASON FOR CALL or QUESTION: "What is your reason for calling today?" or "How can I best help you?" or "What question do you have that I can help answer?"     Junious Dresser from [facility] requesting verbal order for UA 2. CALLER: Document the source of call. (e.g., laboratory, patient).     Above  Protocols used: PCP Call - No Triage-A-AH

## 2022-07-27 DIAGNOSIS — E119 Type 2 diabetes mellitus without complications: Secondary | ICD-10-CM | POA: Diagnosis not present

## 2022-07-27 DIAGNOSIS — I639 Cerebral infarction, unspecified: Secondary | ICD-10-CM | POA: Diagnosis not present

## 2022-07-27 DIAGNOSIS — R54 Age-related physical debility: Secondary | ICD-10-CM | POA: Diagnosis not present

## 2022-07-27 DIAGNOSIS — N390 Urinary tract infection, site not specified: Secondary | ICD-10-CM | POA: Diagnosis not present

## 2022-07-27 DIAGNOSIS — Z993 Dependence on wheelchair: Secondary | ICD-10-CM | POA: Diagnosis not present

## 2022-07-27 DIAGNOSIS — J449 Chronic obstructive pulmonary disease, unspecified: Secondary | ICD-10-CM | POA: Diagnosis not present

## 2022-07-27 DIAGNOSIS — M545 Low back pain, unspecified: Secondary | ICD-10-CM | POA: Diagnosis not present

## 2022-07-28 DIAGNOSIS — M545 Low back pain, unspecified: Secondary | ICD-10-CM | POA: Diagnosis not present

## 2022-07-28 DIAGNOSIS — E559 Vitamin D deficiency, unspecified: Secondary | ICD-10-CM | POA: Diagnosis not present

## 2022-07-28 DIAGNOSIS — G894 Chronic pain syndrome: Secondary | ICD-10-CM | POA: Diagnosis not present

## 2022-07-28 DIAGNOSIS — Z79891 Long term (current) use of opiate analgesic: Secondary | ICD-10-CM | POA: Diagnosis not present

## 2022-07-28 DIAGNOSIS — I69351 Hemiplegia and hemiparesis following cerebral infarction affecting right dominant side: Secondary | ICD-10-CM | POA: Diagnosis not present

## 2022-07-28 DIAGNOSIS — M25551 Pain in right hip: Secondary | ICD-10-CM | POA: Diagnosis not present

## 2022-07-28 DIAGNOSIS — J449 Chronic obstructive pulmonary disease, unspecified: Secondary | ICD-10-CM | POA: Diagnosis not present

## 2022-07-28 DIAGNOSIS — E785 Hyperlipidemia, unspecified: Secondary | ICD-10-CM | POA: Diagnosis not present

## 2022-07-28 DIAGNOSIS — F17210 Nicotine dependence, cigarettes, uncomplicated: Secondary | ICD-10-CM | POA: Diagnosis not present

## 2022-07-28 DIAGNOSIS — Z993 Dependence on wheelchair: Secondary | ICD-10-CM | POA: Diagnosis not present

## 2022-07-28 DIAGNOSIS — G43711 Chronic migraine without aura, intractable, with status migrainosus: Secondary | ICD-10-CM | POA: Diagnosis not present

## 2022-07-28 DIAGNOSIS — E119 Type 2 diabetes mellitus without complications: Secondary | ICD-10-CM | POA: Diagnosis not present

## 2022-07-28 DIAGNOSIS — J9611 Chronic respiratory failure with hypoxia: Secondary | ICD-10-CM | POA: Diagnosis not present

## 2022-07-28 DIAGNOSIS — J432 Centrilobular emphysema: Secondary | ICD-10-CM | POA: Diagnosis not present

## 2022-07-28 DIAGNOSIS — Z7902 Long term (current) use of antithrombotics/antiplatelets: Secondary | ICD-10-CM | POA: Diagnosis not present

## 2022-07-28 DIAGNOSIS — Z7984 Long term (current) use of oral hypoglycemic drugs: Secondary | ICD-10-CM | POA: Diagnosis not present

## 2022-07-28 DIAGNOSIS — N390 Urinary tract infection, site not specified: Secondary | ICD-10-CM | POA: Diagnosis not present

## 2022-07-28 NOTE — Telephone Encounter (Signed)
Okay for urinalysis

## 2022-07-28 NOTE — Telephone Encounter (Signed)
Connie advised.  ° °Thanks,  ° °-Laura  °

## 2022-07-30 ENCOUNTER — Encounter: Payer: Self-pay | Admitting: Family Medicine

## 2022-07-30 ENCOUNTER — Telehealth: Payer: Self-pay

## 2022-07-30 DIAGNOSIS — Z7902 Long term (current) use of antithrombotics/antiplatelets: Secondary | ICD-10-CM | POA: Diagnosis not present

## 2022-07-30 DIAGNOSIS — E559 Vitamin D deficiency, unspecified: Secondary | ICD-10-CM | POA: Diagnosis not present

## 2022-07-30 DIAGNOSIS — J432 Centrilobular emphysema: Secondary | ICD-10-CM | POA: Diagnosis not present

## 2022-07-30 DIAGNOSIS — J9611 Chronic respiratory failure with hypoxia: Secondary | ICD-10-CM | POA: Diagnosis not present

## 2022-07-30 DIAGNOSIS — M545 Low back pain, unspecified: Secondary | ICD-10-CM | POA: Diagnosis not present

## 2022-07-30 DIAGNOSIS — Z993 Dependence on wheelchair: Secondary | ICD-10-CM | POA: Diagnosis not present

## 2022-07-30 DIAGNOSIS — G894 Chronic pain syndrome: Secondary | ICD-10-CM | POA: Diagnosis not present

## 2022-07-30 DIAGNOSIS — M25551 Pain in right hip: Secondary | ICD-10-CM | POA: Diagnosis not present

## 2022-07-30 DIAGNOSIS — Z79891 Long term (current) use of opiate analgesic: Secondary | ICD-10-CM | POA: Diagnosis not present

## 2022-07-30 DIAGNOSIS — E785 Hyperlipidemia, unspecified: Secondary | ICD-10-CM | POA: Diagnosis not present

## 2022-07-30 DIAGNOSIS — G43711 Chronic migraine without aura, intractable, with status migrainosus: Secondary | ICD-10-CM | POA: Diagnosis not present

## 2022-07-30 DIAGNOSIS — E119 Type 2 diabetes mellitus without complications: Secondary | ICD-10-CM | POA: Diagnosis not present

## 2022-07-30 DIAGNOSIS — N390 Urinary tract infection, site not specified: Secondary | ICD-10-CM | POA: Diagnosis not present

## 2022-07-30 DIAGNOSIS — J449 Chronic obstructive pulmonary disease, unspecified: Secondary | ICD-10-CM | POA: Diagnosis not present

## 2022-07-30 DIAGNOSIS — Z7984 Long term (current) use of oral hypoglycemic drugs: Secondary | ICD-10-CM | POA: Diagnosis not present

## 2022-07-30 DIAGNOSIS — I69351 Hemiplegia and hemiparesis following cerebral infarction affecting right dominant side: Secondary | ICD-10-CM | POA: Diagnosis not present

## 2022-07-30 DIAGNOSIS — F17210 Nicotine dependence, cigarettes, uncomplicated: Secondary | ICD-10-CM | POA: Diagnosis not present

## 2022-07-30 NOTE — Telephone Encounter (Signed)
Copied from CRM [id number]. Topic: General - Other >> Jul 30, 2022  2:07 PM Everette C wrote: Reason for CRM: [name] with [facility] has called to share that the patient will be discharged from services   The patient will no longer need any additional visits  Please contact further if needed

## 2022-07-31 ENCOUNTER — Other Ambulatory Visit: Payer: Self-pay

## 2022-07-31 DIAGNOSIS — I70223 Atherosclerosis of native arteries of extremities with rest pain, bilateral legs: Secondary | ICD-10-CM

## 2022-07-31 DIAGNOSIS — E119 Type 2 diabetes mellitus without complications: Secondary | ICD-10-CM | POA: Diagnosis not present

## 2022-07-31 MED ORDER — FLUCONAZOLE 100 MG PO TABS: ORAL_TABLET | ORAL | 1 refills | Status: DC

## 2022-07-31 MED ORDER — CEPHALEXIN 500 MG PO CAPS
500.0000 mg | ORAL_CAPSULE | Freq: Three times a day (TID) | ORAL | 0 refills | Status: DC
Start: 2022-07-31 — End: 2022-09-08

## 2022-07-31 NOTE — Addendum Note (Signed)
Addended by: Smitty Cords on: 07/31/2022 02:49 PM   Modules accepted: Orders

## 2022-07-31 NOTE — Telephone Encounter (Signed)
Urine culture result shows Klebsiella UTI. It is sensitive to Keflex antibiotic. Resistant to many other antibiotics.  She has allergy rash to Augmentin. But has not had Keflex, this should be appropriate  Saralyn Pilar, DO [facility] 07/31/2022, 2:49 PM

## 2022-07-31 NOTE — Telephone Encounter (Signed)
Patient aware of results and recommendations. °

## 2022-08-01 DIAGNOSIS — F17210 Nicotine dependence, cigarettes, uncomplicated: Secondary | ICD-10-CM | POA: Diagnosis not present

## 2022-08-01 DIAGNOSIS — M25562 Pain in left knee: Secondary | ICD-10-CM | POA: Diagnosis not present

## 2022-08-01 DIAGNOSIS — F17200 Nicotine dependence, unspecified, uncomplicated: Secondary | ICD-10-CM | POA: Diagnosis not present

## 2022-08-01 DIAGNOSIS — M25561 Pain in right knee: Secondary | ICD-10-CM | POA: Diagnosis not present

## 2022-08-01 DIAGNOSIS — M5416 Radiculopathy, lumbar region: Secondary | ICD-10-CM | POA: Diagnosis not present

## 2022-08-01 DIAGNOSIS — E7800 Pure hypercholesterolemia, unspecified: Secondary | ICD-10-CM | POA: Diagnosis not present

## 2022-08-01 DIAGNOSIS — Z79899 Other long term (current) drug therapy: Secondary | ICD-10-CM | POA: Diagnosis not present

## 2022-08-01 DIAGNOSIS — G8929 Other chronic pain: Secondary | ICD-10-CM | POA: Diagnosis not present

## 2022-08-04 DIAGNOSIS — Z7984 Long term (current) use of oral hypoglycemic drugs: Secondary | ICD-10-CM | POA: Diagnosis not present

## 2022-08-04 DIAGNOSIS — Z79891 Long term (current) use of opiate analgesic: Secondary | ICD-10-CM | POA: Diagnosis not present

## 2022-08-04 DIAGNOSIS — G894 Chronic pain syndrome: Secondary | ICD-10-CM | POA: Diagnosis not present

## 2022-08-04 DIAGNOSIS — J432 Centrilobular emphysema: Secondary | ICD-10-CM | POA: Diagnosis not present

## 2022-08-04 DIAGNOSIS — E559 Vitamin D deficiency, unspecified: Secondary | ICD-10-CM | POA: Diagnosis not present

## 2022-08-04 DIAGNOSIS — J9611 Chronic respiratory failure with hypoxia: Secondary | ICD-10-CM | POA: Diagnosis not present

## 2022-08-04 DIAGNOSIS — J449 Chronic obstructive pulmonary disease, unspecified: Secondary | ICD-10-CM | POA: Diagnosis not present

## 2022-08-04 DIAGNOSIS — Z7902 Long term (current) use of antithrombotics/antiplatelets: Secondary | ICD-10-CM | POA: Diagnosis not present

## 2022-08-04 DIAGNOSIS — M25551 Pain in right hip: Secondary | ICD-10-CM | POA: Diagnosis not present

## 2022-08-04 DIAGNOSIS — E119 Type 2 diabetes mellitus without complications: Secondary | ICD-10-CM | POA: Diagnosis not present

## 2022-08-04 DIAGNOSIS — F17210 Nicotine dependence, cigarettes, uncomplicated: Secondary | ICD-10-CM | POA: Diagnosis not present

## 2022-08-04 DIAGNOSIS — M545 Low back pain, unspecified: Secondary | ICD-10-CM | POA: Diagnosis not present

## 2022-08-04 DIAGNOSIS — G43711 Chronic migraine without aura, intractable, with status migrainosus: Secondary | ICD-10-CM | POA: Diagnosis not present

## 2022-08-04 DIAGNOSIS — I69351 Hemiplegia and hemiparesis following cerebral infarction affecting right dominant side: Secondary | ICD-10-CM | POA: Diagnosis not present

## 2022-08-04 DIAGNOSIS — Z993 Dependence on wheelchair: Secondary | ICD-10-CM | POA: Diagnosis not present

## 2022-08-04 DIAGNOSIS — E785 Hyperlipidemia, unspecified: Secondary | ICD-10-CM | POA: Diagnosis not present

## 2022-08-04 DIAGNOSIS — N390 Urinary tract infection, site not specified: Secondary | ICD-10-CM | POA: Diagnosis not present

## 2022-08-06 DIAGNOSIS — Z79899 Other long term (current) drug therapy: Secondary | ICD-10-CM | POA: Diagnosis not present

## 2022-08-08 DIAGNOSIS — M533 Sacrococcygeal disorders, not elsewhere classified: Secondary | ICD-10-CM | POA: Diagnosis not present

## 2022-08-11 DIAGNOSIS — Z7902 Long term (current) use of antithrombotics/antiplatelets: Secondary | ICD-10-CM | POA: Diagnosis not present

## 2022-08-11 DIAGNOSIS — G894 Chronic pain syndrome: Secondary | ICD-10-CM | POA: Diagnosis not present

## 2022-08-11 DIAGNOSIS — E785 Hyperlipidemia, unspecified: Secondary | ICD-10-CM | POA: Diagnosis not present

## 2022-08-11 DIAGNOSIS — F17210 Nicotine dependence, cigarettes, uncomplicated: Secondary | ICD-10-CM | POA: Diagnosis not present

## 2022-08-11 DIAGNOSIS — E559 Vitamin D deficiency, unspecified: Secondary | ICD-10-CM | POA: Diagnosis not present

## 2022-08-11 DIAGNOSIS — I69351 Hemiplegia and hemiparesis following cerebral infarction affecting right dominant side: Secondary | ICD-10-CM | POA: Diagnosis not present

## 2022-08-11 DIAGNOSIS — J9611 Chronic respiratory failure with hypoxia: Secondary | ICD-10-CM | POA: Diagnosis not present

## 2022-08-11 DIAGNOSIS — M545 Low back pain, unspecified: Secondary | ICD-10-CM | POA: Diagnosis not present

## 2022-08-11 DIAGNOSIS — Z79891 Long term (current) use of opiate analgesic: Secondary | ICD-10-CM | POA: Diagnosis not present

## 2022-08-11 DIAGNOSIS — J449 Chronic obstructive pulmonary disease, unspecified: Secondary | ICD-10-CM | POA: Diagnosis not present

## 2022-08-11 DIAGNOSIS — G43711 Chronic migraine without aura, intractable, with status migrainosus: Secondary | ICD-10-CM | POA: Diagnosis not present

## 2022-08-11 DIAGNOSIS — N390 Urinary tract infection, site not specified: Secondary | ICD-10-CM | POA: Diagnosis not present

## 2022-08-11 DIAGNOSIS — E119 Type 2 diabetes mellitus without complications: Secondary | ICD-10-CM | POA: Diagnosis not present

## 2022-08-11 DIAGNOSIS — Z7984 Long term (current) use of oral hypoglycemic drugs: Secondary | ICD-10-CM | POA: Diagnosis not present

## 2022-08-11 DIAGNOSIS — Z993 Dependence on wheelchair: Secondary | ICD-10-CM | POA: Diagnosis not present

## 2022-08-11 DIAGNOSIS — M25551 Pain in right hip: Secondary | ICD-10-CM | POA: Diagnosis not present

## 2022-08-11 DIAGNOSIS — J432 Centrilobular emphysema: Secondary | ICD-10-CM | POA: Diagnosis not present

## 2022-08-12 ENCOUNTER — Encounter: Payer: Self-pay | Admitting: Family Medicine

## 2022-08-12 DIAGNOSIS — I69951 Hemiplegia and hemiparesis following unspecified cerebrovascular disease affecting right dominant side: Secondary | ICD-10-CM

## 2022-08-12 DIAGNOSIS — J449 Chronic obstructive pulmonary disease, unspecified: Secondary | ICD-10-CM | POA: Diagnosis not present

## 2022-08-12 DIAGNOSIS — G43711 Chronic migraine without aura, intractable, with status migrainosus: Secondary | ICD-10-CM | POA: Diagnosis not present

## 2022-08-12 DIAGNOSIS — M25551 Pain in right hip: Secondary | ICD-10-CM | POA: Diagnosis not present

## 2022-08-12 DIAGNOSIS — M545 Low back pain, unspecified: Secondary | ICD-10-CM | POA: Diagnosis not present

## 2022-08-12 DIAGNOSIS — Z79891 Long term (current) use of opiate analgesic: Secondary | ICD-10-CM | POA: Diagnosis not present

## 2022-08-12 DIAGNOSIS — F17210 Nicotine dependence, cigarettes, uncomplicated: Secondary | ICD-10-CM | POA: Diagnosis not present

## 2022-08-12 DIAGNOSIS — Z993 Dependence on wheelchair: Secondary | ICD-10-CM | POA: Diagnosis not present

## 2022-08-12 DIAGNOSIS — I69351 Hemiplegia and hemiparesis following cerebral infarction affecting right dominant side: Secondary | ICD-10-CM | POA: Diagnosis not present

## 2022-08-12 DIAGNOSIS — J9611 Chronic respiratory failure with hypoxia: Secondary | ICD-10-CM | POA: Diagnosis not present

## 2022-08-12 DIAGNOSIS — E785 Hyperlipidemia, unspecified: Secondary | ICD-10-CM | POA: Diagnosis not present

## 2022-08-12 DIAGNOSIS — Z7984 Long term (current) use of oral hypoglycemic drugs: Secondary | ICD-10-CM | POA: Diagnosis not present

## 2022-08-12 DIAGNOSIS — J432 Centrilobular emphysema: Secondary | ICD-10-CM | POA: Diagnosis not present

## 2022-08-12 DIAGNOSIS — Z7902 Long term (current) use of antithrombotics/antiplatelets: Secondary | ICD-10-CM | POA: Diagnosis not present

## 2022-08-12 DIAGNOSIS — E559 Vitamin D deficiency, unspecified: Secondary | ICD-10-CM | POA: Diagnosis not present

## 2022-08-12 DIAGNOSIS — G894 Chronic pain syndrome: Secondary | ICD-10-CM | POA: Diagnosis not present

## 2022-08-12 DIAGNOSIS — E119 Type 2 diabetes mellitus without complications: Secondary | ICD-10-CM | POA: Diagnosis not present

## 2022-08-12 DIAGNOSIS — N390 Urinary tract infection, site not specified: Secondary | ICD-10-CM | POA: Diagnosis not present

## 2022-08-20 ENCOUNTER — Telehealth: Payer: Self-pay

## 2022-08-20 ENCOUNTER — Ambulatory Visit (HOSPITAL_COMMUNITY): Payer: 59

## 2022-08-20 ENCOUNTER — Ambulatory Visit (HOSPITAL_COMMUNITY)
Admission: RE | Admit: 2022-08-20 | Discharge: 2022-08-20 | Disposition: A | Discharge status: Home / Self Care | Payer: 59 | Source: Ambulatory Visit | Attending: Vascular Surgery | Admitting: Vascular Surgery

## 2022-08-20 DIAGNOSIS — I700 Atherosclerosis of aorta: Secondary | ICD-10-CM | POA: Diagnosis not present

## 2022-08-20 DIAGNOSIS — E1169 Type 2 diabetes mellitus with other specified complication: Secondary | ICD-10-CM | POA: Diagnosis not present

## 2022-08-20 DIAGNOSIS — I70223 Atherosclerosis of native arteries of extremities with rest pain, bilateral legs: Secondary | ICD-10-CM | POA: Diagnosis not present

## 2022-08-20 DIAGNOSIS — K439 Ventral hernia without obstruction or gangrene: Secondary | ICD-10-CM | POA: Diagnosis not present

## 2022-08-20 DIAGNOSIS — K828 Other specified diseases of gallbladder: Secondary | ICD-10-CM | POA: Diagnosis not present

## 2022-08-20 DIAGNOSIS — I70201 Unspecified atherosclerosis of native arteries of extremities, right leg: Secondary | ICD-10-CM | POA: Diagnosis not present

## 2022-08-20 LAB — POCT I-STAT CREATININE: Creatinine, Ser: 0.9 mg/dL (ref 0.44–1.00)

## 2022-08-20 MED ORDER — IOHEXOL 350 MG/ML SOLN
100.0000 mL | Freq: Once | INTRAVENOUS | Status: AC | PRN
  Administered 2022-08-20: 100 mL via INTRAVENOUS

## 2022-08-20 NOTE — Telephone Encounter (Signed)
Copied from CRM [id number]. Topic: General - Other >> Aug 20, 2022 11:25 AM Franchot Heidelberg wrote: Reason for CRM: Aram Beecham from [employer], called to report that she is refaxing the order for a power wheelchair. Aware that PCP is on vacation.   Best contact: [phone]   Deadline: October 20th (if she does not have an appt with him just for this)

## 2022-08-21 ENCOUNTER — Other Ambulatory Visit (HOSPITAL_COMMUNITY): Payer: 59

## 2022-08-21 DIAGNOSIS — E119 Type 2 diabetes mellitus without complications: Secondary | ICD-10-CM | POA: Diagnosis not present

## 2022-08-21 DIAGNOSIS — I69351 Hemiplegia and hemiparesis following cerebral infarction affecting right dominant side: Secondary | ICD-10-CM | POA: Diagnosis not present

## 2022-08-21 DIAGNOSIS — G43711 Chronic migraine without aura, intractable, with status migrainosus: Secondary | ICD-10-CM | POA: Diagnosis not present

## 2022-08-21 DIAGNOSIS — N390 Urinary tract infection, site not specified: Secondary | ICD-10-CM | POA: Diagnosis not present

## 2022-08-21 DIAGNOSIS — M545 Low back pain, unspecified: Secondary | ICD-10-CM | POA: Diagnosis not present

## 2022-08-21 DIAGNOSIS — F17210 Nicotine dependence, cigarettes, uncomplicated: Secondary | ICD-10-CM | POA: Diagnosis not present

## 2022-08-21 DIAGNOSIS — M25551 Pain in right hip: Secondary | ICD-10-CM | POA: Diagnosis not present

## 2022-08-21 DIAGNOSIS — Z7902 Long term (current) use of antithrombotics/antiplatelets: Secondary | ICD-10-CM | POA: Diagnosis not present

## 2022-08-21 DIAGNOSIS — J449 Chronic obstructive pulmonary disease, unspecified: Secondary | ICD-10-CM | POA: Diagnosis not present

## 2022-08-21 DIAGNOSIS — E785 Hyperlipidemia, unspecified: Secondary | ICD-10-CM | POA: Diagnosis not present

## 2022-08-21 DIAGNOSIS — Z7984 Long term (current) use of oral hypoglycemic drugs: Secondary | ICD-10-CM | POA: Diagnosis not present

## 2022-08-21 DIAGNOSIS — J432 Centrilobular emphysema: Secondary | ICD-10-CM | POA: Diagnosis not present

## 2022-08-21 DIAGNOSIS — J9611 Chronic respiratory failure with hypoxia: Secondary | ICD-10-CM | POA: Diagnosis not present

## 2022-08-21 DIAGNOSIS — E559 Vitamin D deficiency, unspecified: Secondary | ICD-10-CM | POA: Diagnosis not present

## 2022-08-21 DIAGNOSIS — G894 Chronic pain syndrome: Secondary | ICD-10-CM | POA: Diagnosis not present

## 2022-08-21 DIAGNOSIS — Z993 Dependence on wheelchair: Secondary | ICD-10-CM | POA: Diagnosis not present

## 2022-08-21 DIAGNOSIS — Z79891 Long term (current) use of opiate analgesic: Secondary | ICD-10-CM | POA: Diagnosis not present

## 2022-08-21 NOTE — Telephone Encounter (Signed)
Will be on the look out for this but this may require a face-to-face visit for mobility assessment

## 2022-08-26 NOTE — Telephone Encounter (Signed)
That question should be asked to the [employer] company who she is ordering the equipment through. For Power Wheelchair usually it is an in person evaluation. I am not sure if they accept it virtually.  Either she can ask them, or we can try to call later Carollee Herter if you are able to help, or if need may be easier to ask the patient to try to get this answer.  If they will accept it virtually, then we can try virtual. But the decision is not up to me.  The form from [employer] company for the Power Wheelchair has a lot of physical / musculoskeletal evaluation sections, she would have to help me answer them if we do it virtually.  Saralyn Pilar, DO [facility] 08/26/2022, 3:19 PM

## 2022-08-26 NOTE — Telephone Encounter (Signed)
Pt prefers to be seen virtually for this and wants to know if that is possible? Please advise.

## 2022-08-26 NOTE — Telephone Encounter (Signed)
My chart message sent to patient to schedule appointment.

## 2022-08-26 NOTE — Telephone Encounter (Signed)
[employer] says as long as visit has audio and video it will be compliant with insurance.  Virtual appt made.  Attempted to reach patient but got disconnected.

## 2022-08-26 NOTE — Telephone Encounter (Signed)
We received paperwork for the Gel Overlay and also Power Wheelchair.  The paperwork is about 20+ pages and very detailed.  It requires an office visit for documentation to get it approved. Power wheelchair and Gel overlay cannot be ordered without a new visit addressing these issues.  She needs to be scheduled for apt  Saralyn Pilar, DO [facility] 08/26/2022, 1:02 PM

## 2022-08-27 ENCOUNTER — Ambulatory Visit (INDEPENDENT_AMBULATORY_CARE_PROVIDER_SITE_OTHER): Payer: 59 | Admitting: Vascular Surgery

## 2022-08-27 ENCOUNTER — Ambulatory Visit (HOSPITAL_COMMUNITY)
Admission: RE | Admit: 2022-08-27 | Discharge: 2022-08-27 | Disposition: A | Discharge status: Home / Self Care | Payer: 59 | Source: Ambulatory Visit | Attending: Vascular Surgery | Admitting: Vascular Surgery

## 2022-08-27 ENCOUNTER — Encounter: Payer: Self-pay | Admitting: Vascular Surgery

## 2022-08-27 VITALS — BP 100/72 | HR 106 | Temp 97.9°F | Resp 18 | Ht 66.0 in | Wt 244.0 lb

## 2022-08-27 DIAGNOSIS — R54 Age-related physical debility: Secondary | ICD-10-CM | POA: Diagnosis not present

## 2022-08-27 DIAGNOSIS — I639 Cerebral infarction, unspecified: Secondary | ICD-10-CM | POA: Diagnosis not present

## 2022-08-27 DIAGNOSIS — Z993 Dependence on wheelchair: Secondary | ICD-10-CM | POA: Diagnosis not present

## 2022-08-27 DIAGNOSIS — J449 Chronic obstructive pulmonary disease, unspecified: Secondary | ICD-10-CM | POA: Diagnosis not present

## 2022-08-27 DIAGNOSIS — I739 Peripheral vascular disease, unspecified: Secondary | ICD-10-CM | POA: Insufficient documentation

## 2022-08-27 DIAGNOSIS — M545 Low back pain, unspecified: Secondary | ICD-10-CM | POA: Diagnosis not present

## 2022-08-27 NOTE — Progress Notes (Signed)
Patient ID: [name], female   DOB: [birth year], 57 y.o.   MRN: [id number]  Reason for Consult: No chief complaint on file.   Referred by Saralyn Pilar *  Subjective:     HPI:  [name] is a 57 y.o. female history of hyperlipidemia, hypertension, COPD previous stroke and diabetes.  She states that since her last visit she has not increased her activity is only been able to stand with the help of PT.  She is still smoking 1 pack/day.  She does take Plavix.  She has pain in her legs right greater than left that is almost constant.  She does not have any tissue loss or ulceration.  Past Medical History:  Diagnosis Date   Anxiety    Cancer (HCC)    a spot on liver and treated    Complication of anesthesia    restless,easily upset   COPD (chronic obstructive pulmonary disease) (HCC)    Depression    Diabetes mellitus without complication (HCC)    Diverticulitis    Fatty liver    GERD (gastroesophageal reflux disease)    Headache(784.0)    migraines   History of kidney stones    Hyperlipidemia    Hypertension    Pneumonia    Restless    Stroke (HCC)    Family History  Problem Relation Age of Onset   Diabetes Mother    Hypertension Mother    Hypertension Father    Past Surgical History:  Procedure Laterality Date   ABDOMINAL HYSTERECTOMY     ACHILLES TENDON LENGTHENING Right 08/18/2017   Procedure: ACHILLES TENDON LENGTHENING;  Surgeon: Myrene Galas, MD;  [facility];  Service: Orthopedics;  Laterality: Right;   ANTERIOR CERVICAL DECOMP/DISCECTOMY FUSION N/A 03/11/2012   Procedure: ANTERIOR CERVICAL DECOMPRESSION/DISCECTOMY FUSION 1 LEVEL;  Surgeon: Cristi Loron, MD;  [facility];  Service: Neurosurgery;  Laterality: N/A;  Cervical five-six anterior cervical decompression with fusion interbody prothesis plating and bonegraft   BACK SURGERY     EXTERNAL FIXATION LEG Right 08/10/2017   Procedure: EXTERNAL FIXATION ANKLE;  Surgeon: Donato Heinz, MD;  Location: [facility] ORS;  Service: Orthopedics;  Laterality: Right;   EXTERNAL FIXATION REMOVAL Right 08/18/2017   Procedure: REMOVAL EXTERNAL FIXATION LEG;  Surgeon: Myrene Galas, MD;  [facility];  Service: Orthopedics;  Laterality: Right;   EYE SURGERY     HERNIA REPAIR     IR FL GUIDED LOC OF NEEDLE/CATH TIP FOR SPINAL INJECTION LT  12/09/2021   IR FL GUIDED LOC OF NEEDLE/CATH TIP FOR SPINAL INJECTION RT  12/09/2021   JOINT REPLACEMENT     bil knees   ORIF ANKLE FRACTURE Right 08/18/2017   Procedure: OPEN REDUCTION INTERNAL FIXATION (ORIF) ANKLE FRACTURE;  Surgeon: Myrene Galas, MD;  [facility];  Service: Orthopedics;  Laterality: Right;   REPLACEMENT TOTAL KNEE BILATERAL      Short Social History:  Social History   Tobacco Use   Smoking status: Every Day    Current packs/day: 1.00    Average packs/day: 1 pack/day for 20.0 years (20.0 ttl pk-yrs)    Types: Cigarettes   Smokeless tobacco: Never  Substance Use Topics   Alcohol use: No    Allergies  Allergen Reactions   Tramadol Nausea And Vomiting and Hypertension   Augmentin [Amoxicillin-Pot Clavulanate] Rash    Current Outpatient Medications  Medication Sig Dispense Refill   albuterol (VENTOLIN HFA) 108 (90 Base) MCG/ACT inhaler USE 2  INHALATIONS BY MOUTH EVERY 4 HOURS AS NEEDED FOR WHEEZING  OR SHORTNESS OF BREATH 40.2 g 2   atorvastatin (LIPITOR) 40 MG tablet Take 1 tablet (40 mg total) by mouth at bedtime. 90 tablet 0   azelastine (ASTELIN) 0.1 % nasal spray Place 2 sprays into both nostrils 2 (two) times daily. 30 mL 12   buPROPion (WELLBUTRIN XL) 150 MG 24 hr tablet Take 1 tablet (150 mg total) by mouth daily. 90 tablet 1   cephALEXin (KEFLEX) 500 MG capsule Take 1 capsule (500 mg total) by mouth 3 (three) times daily. For 7 days 21 capsule 0   citalopram (CELEXA) 40 MG tablet TAKE 1 TABLET BY MOUTH DAILY 100 tablet 3   clopidogrel (PLAVIX) 75 MG tablet TAKE ONE TABLET BY MOUTH DAILY AT 9AM 100  tablet 11   fluconazole (DIFLUCAN) 100 MG tablet 1 tablet once daily x 3 days as needed 3 tablet 1   gabapentin (NEURONTIN) 600 MG tablet TAKE 1 TABLET(600 MG) BY MOUTH FOUR TIMES DAILY 360 tablet 1   IBU 600 MG tablet Take 600 mg by mouth every 12 (twelve) hours as needed.     metFORMIN (GLUCOPHAGE) 1000 MG tablet TAKE 1 TABLET(1000 MG) BY MOUTH TWICE DAILY WITH A MEAL 180 tablet 1   methocarbamol (ROBAXIN) 500 MG tablet Take 1 tablet (500 mg total) by mouth 3 (three) times daily. 90 tablet 5   MOUNJARO 10 MG/0.5ML Pen Inject 10 mg into the skin once a week. 2 mL 2   nystatin (MYCOSTATIN/NYSTOP) powder APPLY 1 APPLICATION TOPICALLY THREE TIMES DAILY AS NEEDED FOR YEAST RASH INTERTRIGO 30 g 11   oxyCODONE-acetaminophen (PERCOCET) 10-325 MG tablet Take 1 tablet by mouth every 8 (eight) hours as needed for pain. 90 tablet 0   pantoprazole (PROTONIX) 40 MG tablet Take 1 tablet (40 mg total) by mouth daily. 90 tablet 1   QUEtiapine (SEROQUEL) 100 MG tablet TAKE 1 TABLET BY MOUTH AT  BEDTIME 100 tablet 3   topiramate (TOPAMAX) 100 MG tablet Take 1 tablet (100 mg total) by mouth 2 (two) times daily. 200 tablet 1   trimethoprim (TRIMPEX) 100 MG tablet Take 1 tablet (100 mg total) by mouth daily. 30 tablet 11   VITAMIN D PO Take by mouth.     Vitamin D, Ergocalciferol, (DRISDOL) 1.25 MG (50000 UNIT) CAPS capsule TAKE 1 CAPSULE BY MOUTH EVERY WEDNESDAY @9AM  4 capsule 11   No current facility-administered medications for this visit.    Review of Systems  Constitutional:  Constitutional negative. HENT: HENT negative.  Eyes: Eyes negative.  Respiratory: Respiratory negative.  Cardiovascular: Cardiovascular negative.  GI: Gastrointestinal negative.  Musculoskeletal: Positive for leg pain.  Neurological: Positive for focal weakness.  Hematologic: Hematologic/lymphatic negative.  Psychiatric: Psychiatric negative.        Objective:  Objective   Vitals:   08/27/22 1543  BP: 100/72  Pulse: (!)  106  Resp: 18  Temp: 97.9 F (36.6 C)  SpO2: 97%    Physical Exam HENT:     Nose: Nose normal.  Eyes:     Pupils: Pupils are equal, round, and reactive to light.  Cardiovascular:     Rate and Rhythm: Normal rate.     Pulses:          Popliteal pulses are 0 on the right side and 0 on the left side.       Dorsalis pedis pulses are 0 on the right side and 0 on the left side.  Posterior tibial pulses are 0 on the right side and 0 on the left side.  Pulmonary:     Effort: Pulmonary effort is normal.  Abdominal:     General: Abdomen is flat.     Palpations: Abdomen is soft.  Musculoskeletal:     Right lower leg: No edema.     Left lower leg: No edema.  Skin:    General: Skin is warm.     Capillary Refill: Capillary refill takes less than 2 seconds.  Neurological:     Mental Status: She is alert. Mental status is at baseline.  Psychiatric:        Mood and Affect: Mood normal.        Thought Content: Thought content normal.     Data: CTA IMPRESSION: VASCULAR   1. Right outflow disease. Atherosclerotic disease involving the right SFA and right popliteal artery. Short segment occlusion or very high-grade stenosis near the junction of the right SFA and right popliteal artery. 2. Portions of the bilateral popliteal arteries are not well visualized due to artifact from the bilateral knee replacements. 3. Mild-to-moderate narrowing in the left common femoral artery. 4. Suspect mild runoff disease bilaterally but limited evaluation. 5.  Aortic Atherosclerosis (ICD10-I70.0).   NON-VASCULAR   1. No acute abnormality in the abdomen or pelvis. 2. Ventral hernia. 3. Minimal change in the L1 vertebral body compression fracture.       I Have independently interpreted her ABIs on the right to be 0.74 with toe pressure of 57 and biphasic on the left monophasic 0.99 toe pressure of 71.   Assessment/Plan:     57 year old female with bilateral right greater than left lower  extremity pain that is likely multifactorial in nature related to both neuropathy as well as arterial insufficiency at least on the right but appears to have brisk capillary refill although no palpable pulses in the lower extremities or appreciable the common femoral pulses are limited only by body habitus and positioning given that she is wheelchair-bound.  Given that she is still smoking I recommended no vascular intervention unless she has urgent need.  She will follow-up in 6 months with repeat ABIs.     Maeola Harman MD [facility]

## 2022-08-28 ENCOUNTER — Other Ambulatory Visit: Payer: 59

## 2022-08-28 DIAGNOSIS — F17210 Nicotine dependence, cigarettes, uncomplicated: Secondary | ICD-10-CM | POA: Diagnosis not present

## 2022-08-28 DIAGNOSIS — Z7984 Long term (current) use of oral hypoglycemic drugs: Secondary | ICD-10-CM | POA: Diagnosis not present

## 2022-08-28 DIAGNOSIS — Z7902 Long term (current) use of antithrombotics/antiplatelets: Secondary | ICD-10-CM | POA: Diagnosis not present

## 2022-08-28 DIAGNOSIS — J9611 Chronic respiratory failure with hypoxia: Secondary | ICD-10-CM | POA: Diagnosis not present

## 2022-08-28 DIAGNOSIS — E559 Vitamin D deficiency, unspecified: Secondary | ICD-10-CM | POA: Diagnosis not present

## 2022-08-28 DIAGNOSIS — Z993 Dependence on wheelchair: Secondary | ICD-10-CM | POA: Diagnosis not present

## 2022-08-28 DIAGNOSIS — E119 Type 2 diabetes mellitus without complications: Secondary | ICD-10-CM | POA: Diagnosis not present

## 2022-08-28 DIAGNOSIS — E785 Hyperlipidemia, unspecified: Secondary | ICD-10-CM | POA: Diagnosis not present

## 2022-08-28 DIAGNOSIS — J449 Chronic obstructive pulmonary disease, unspecified: Secondary | ICD-10-CM | POA: Diagnosis not present

## 2022-08-28 DIAGNOSIS — N390 Urinary tract infection, site not specified: Secondary | ICD-10-CM | POA: Diagnosis not present

## 2022-08-28 DIAGNOSIS — M545 Low back pain, unspecified: Secondary | ICD-10-CM | POA: Diagnosis not present

## 2022-08-28 DIAGNOSIS — Z79891 Long term (current) use of opiate analgesic: Secondary | ICD-10-CM | POA: Diagnosis not present

## 2022-08-28 DIAGNOSIS — J432 Centrilobular emphysema: Secondary | ICD-10-CM | POA: Diagnosis not present

## 2022-08-28 DIAGNOSIS — I69351 Hemiplegia and hemiparesis following cerebral infarction affecting right dominant side: Secondary | ICD-10-CM | POA: Diagnosis not present

## 2022-08-28 DIAGNOSIS — G894 Chronic pain syndrome: Secondary | ICD-10-CM | POA: Diagnosis not present

## 2022-08-28 DIAGNOSIS — M25551 Pain in right hip: Secondary | ICD-10-CM | POA: Diagnosis not present

## 2022-08-28 DIAGNOSIS — G43711 Chronic migraine without aura, intractable, with status migrainosus: Secondary | ICD-10-CM | POA: Diagnosis not present

## 2022-08-28 LAB — VAS US ABI WITH/WO TBI
Left ABI: 0.99
Right ABI: 0.74

## 2022-08-29 ENCOUNTER — Other Ambulatory Visit: Payer: Self-pay

## 2022-08-29 DIAGNOSIS — I739 Peripheral vascular disease, unspecified: Secondary | ICD-10-CM

## 2022-08-29 DIAGNOSIS — G43711 Chronic migraine without aura, intractable, with status migrainosus: Secondary | ICD-10-CM | POA: Diagnosis not present

## 2022-08-29 DIAGNOSIS — N390 Urinary tract infection, site not specified: Secondary | ICD-10-CM | POA: Diagnosis not present

## 2022-08-29 DIAGNOSIS — I69351 Hemiplegia and hemiparesis following cerebral infarction affecting right dominant side: Secondary | ICD-10-CM | POA: Diagnosis not present

## 2022-08-29 DIAGNOSIS — Z79891 Long term (current) use of opiate analgesic: Secondary | ICD-10-CM | POA: Diagnosis not present

## 2022-08-29 DIAGNOSIS — E785 Hyperlipidemia, unspecified: Secondary | ICD-10-CM | POA: Diagnosis not present

## 2022-08-29 DIAGNOSIS — Z7984 Long term (current) use of oral hypoglycemic drugs: Secondary | ICD-10-CM | POA: Diagnosis not present

## 2022-08-29 DIAGNOSIS — F17210 Nicotine dependence, cigarettes, uncomplicated: Secondary | ICD-10-CM | POA: Diagnosis not present

## 2022-08-29 DIAGNOSIS — M25551 Pain in right hip: Secondary | ICD-10-CM | POA: Diagnosis not present

## 2022-08-29 DIAGNOSIS — J9611 Chronic respiratory failure with hypoxia: Secondary | ICD-10-CM | POA: Diagnosis not present

## 2022-08-29 DIAGNOSIS — Z7902 Long term (current) use of antithrombotics/antiplatelets: Secondary | ICD-10-CM | POA: Diagnosis not present

## 2022-08-29 DIAGNOSIS — J432 Centrilobular emphysema: Secondary | ICD-10-CM | POA: Diagnosis not present

## 2022-08-29 DIAGNOSIS — E119 Type 2 diabetes mellitus without complications: Secondary | ICD-10-CM | POA: Diagnosis not present

## 2022-08-29 DIAGNOSIS — Z993 Dependence on wheelchair: Secondary | ICD-10-CM | POA: Diagnosis not present

## 2022-08-29 DIAGNOSIS — J449 Chronic obstructive pulmonary disease, unspecified: Secondary | ICD-10-CM | POA: Diagnosis not present

## 2022-08-29 DIAGNOSIS — E559 Vitamin D deficiency, unspecified: Secondary | ICD-10-CM | POA: Diagnosis not present

## 2022-08-29 DIAGNOSIS — G894 Chronic pain syndrome: Secondary | ICD-10-CM | POA: Diagnosis not present

## 2022-08-29 DIAGNOSIS — M545 Low back pain, unspecified: Secondary | ICD-10-CM | POA: Diagnosis not present

## 2022-09-01 DIAGNOSIS — F17210 Nicotine dependence, cigarettes, uncomplicated: Secondary | ICD-10-CM | POA: Diagnosis not present

## 2022-09-01 DIAGNOSIS — F17200 Nicotine dependence, unspecified, uncomplicated: Secondary | ICD-10-CM | POA: Diagnosis not present

## 2022-09-01 DIAGNOSIS — E7800 Pure hypercholesterolemia, unspecified: Secondary | ICD-10-CM | POA: Diagnosis not present

## 2022-09-01 DIAGNOSIS — M25561 Pain in right knee: Secondary | ICD-10-CM | POA: Diagnosis not present

## 2022-09-01 DIAGNOSIS — M5416 Radiculopathy, lumbar region: Secondary | ICD-10-CM | POA: Diagnosis not present

## 2022-09-01 DIAGNOSIS — M25562 Pain in left knee: Secondary | ICD-10-CM | POA: Diagnosis not present

## 2022-09-01 DIAGNOSIS — Z79899 Other long term (current) drug therapy: Secondary | ICD-10-CM | POA: Diagnosis not present

## 2022-09-01 DIAGNOSIS — G8929 Other chronic pain: Secondary | ICD-10-CM | POA: Diagnosis not present

## 2022-09-02 ENCOUNTER — Telehealth: Payer: Self-pay | Admitting: Family Medicine

## 2022-09-02 NOTE — Telephone Encounter (Signed)
Home Health Verbal Orders - Caller/Agency: [name] from [facility] Callback Number: [phone] option 2   Requesting Skilled Nursing: need to make up visit from last week per unable to get in contact with patient   Frequency: 1x1w

## 2022-09-02 NOTE — Telephone Encounter (Signed)
Okay to proceed w/ orders  Saralyn Pilar, DO [facility] 09/02/2022, 5:07 PM

## 2022-09-03 NOTE — Telephone Encounter (Signed)
[name] advised.   Thanks,   -Laura  

## 2022-09-04 DIAGNOSIS — Z993 Dependence on wheelchair: Secondary | ICD-10-CM | POA: Diagnosis not present

## 2022-09-04 DIAGNOSIS — I69351 Hemiplegia and hemiparesis following cerebral infarction affecting right dominant side: Secondary | ICD-10-CM | POA: Diagnosis not present

## 2022-09-04 DIAGNOSIS — N390 Urinary tract infection, site not specified: Secondary | ICD-10-CM | POA: Diagnosis not present

## 2022-09-04 DIAGNOSIS — G43711 Chronic migraine without aura, intractable, with status migrainosus: Secondary | ICD-10-CM | POA: Diagnosis not present

## 2022-09-04 DIAGNOSIS — J449 Chronic obstructive pulmonary disease, unspecified: Secondary | ICD-10-CM | POA: Diagnosis not present

## 2022-09-04 DIAGNOSIS — M25551 Pain in right hip: Secondary | ICD-10-CM | POA: Diagnosis not present

## 2022-09-04 DIAGNOSIS — E119 Type 2 diabetes mellitus without complications: Secondary | ICD-10-CM | POA: Diagnosis not present

## 2022-09-04 DIAGNOSIS — J9611 Chronic respiratory failure with hypoxia: Secondary | ICD-10-CM | POA: Diagnosis not present

## 2022-09-04 DIAGNOSIS — Z7984 Long term (current) use of oral hypoglycemic drugs: Secondary | ICD-10-CM | POA: Diagnosis not present

## 2022-09-04 DIAGNOSIS — F17210 Nicotine dependence, cigarettes, uncomplicated: Secondary | ICD-10-CM | POA: Diagnosis not present

## 2022-09-04 DIAGNOSIS — M545 Low back pain, unspecified: Secondary | ICD-10-CM | POA: Diagnosis not present

## 2022-09-04 DIAGNOSIS — E785 Hyperlipidemia, unspecified: Secondary | ICD-10-CM | POA: Diagnosis not present

## 2022-09-04 DIAGNOSIS — E559 Vitamin D deficiency, unspecified: Secondary | ICD-10-CM | POA: Diagnosis not present

## 2022-09-04 DIAGNOSIS — G894 Chronic pain syndrome: Secondary | ICD-10-CM | POA: Diagnosis not present

## 2022-09-04 DIAGNOSIS — J432 Centrilobular emphysema: Secondary | ICD-10-CM | POA: Diagnosis not present

## 2022-09-04 DIAGNOSIS — Z79891 Long term (current) use of opiate analgesic: Secondary | ICD-10-CM | POA: Diagnosis not present

## 2022-09-04 DIAGNOSIS — Z7902 Long term (current) use of antithrombotics/antiplatelets: Secondary | ICD-10-CM | POA: Diagnosis not present
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
## 2022-09-05 ENCOUNTER — Telehealth: Payer: Self-pay | Admitting: Family Medicine

## 2022-09-05 DIAGNOSIS — Z79899 Other long term (current) drug therapy: Secondary | ICD-10-CM | POA: Diagnosis not present

## 2022-09-05 NOTE — Telephone Encounter (Signed)
Verbal orders given to [name].   - [name]

## 2022-09-05 NOTE — Telephone Encounter (Signed)
Home Health Verbal Orders - Caller/Agency: [name]/ [facility] Callback Number: [phone] option 2 Requesting     PT, Monday 8/26 for re certification Frequency: one visit, will reassess and call back with orders

## 2022-09-05 NOTE — Telephone Encounter (Signed)
Tried calling Pam back to leave VM for verbal orders. Mailbox is full and cannot leave VM. [facility] please let them know if they call back that verbal order is good for recertification and wound care.  - Dontreal Miera

## 2022-09-05 NOTE — Telephone Encounter (Signed)
Okay to proceed with approval for recertification for wound care.  Agree with the treatment order for wound care  Saralyn Pilar, DO [facility] 09/05/2022, 11:27 AM

## 2022-09-08 ENCOUNTER — Ambulatory Visit: Payer: 59 | Admitting: Urology

## 2022-09-08 DIAGNOSIS — N3946 Mixed incontinence: Secondary | ICD-10-CM

## 2022-09-08 LAB — URINALYSIS, COMPLETE
Bilirubin, UA: NEGATIVE
Glucose, UA: NEGATIVE
Ketones, UA: NEGATIVE
Leukocytes,UA: NEGATIVE
Nitrite, UA: NEGATIVE
Protein,UA: NEGATIVE
Specific Gravity, UA: 1.02 (ref 1.005–1.030)
Urobilinogen, Ur: 0.2 mg/dL (ref 0.2–1.0)
pH, UA: 5.5 (ref 5.0–7.5)

## 2022-09-08 LAB — MICROSCOPIC EXAMINATION

## 2022-09-08 MED ORDER — CIPROFLOXACIN HCL 250 MG PO TABS: 250.0000 mg | ORAL_TABLET | Freq: Two times a day (BID) | ORAL | 0 refills | Status: AC

## 2022-09-08 MED ORDER — NITROFURANTOIN MACROCRYSTAL 100 MG PO CAPS: 100.0000 mg | ORAL_CAPSULE | Freq: Every day | ORAL | 11 refills | Status: DC

## 2022-09-08 MED ORDER — CIPROFLOXACIN HCL 250 MG PO TABS: 250.0000 mg | ORAL_TABLET | Freq: Two times a day (BID) | ORAL | 0 refills | Status: DC

## 2022-09-08 NOTE — Addendum Note (Signed)
Addended by: Sueanne Margarita on: 09/08/2022 03:03 PM   Modules accepted: Orders

## 2022-09-08 NOTE — Progress Notes (Addendum)
09/08/2022 2:16 PM   [name] 15-Jun-1965 [id number]  Referring provider: Smitty Cords, DO [address] [place],  [place] [address]  Chief Complaint  Patient presents with   Cysto    HPI: I was consulted to assess the patient's urinary incontinence.  She has urge incontinence.  She sometimes leaks with coughing sneezing sometimes bending lifting.  She has high-volume bedwetting.  She soaks 5 pads a day.   She voids every 2-3 hours and has no nocturia   She had a stroke 11 years ago.  She takes a shot for diabetes which is now well-controlled.  She has had neck surgery and low back surgery.  She has had a hysterectomy.  She failed oxybutynin   She thinks he gets 4 bladder infections a year with pressure and burning that respond favorably antibiotics   She has to use a wheelchair because of the stroke.  She now can stand but cannot walk   She had a normal CT scan in 2023 and a positive urine culture April 2024 #2023    Patient has mixed incontinence and high-volume bedwetting. She is in a wheelchair and has medical comorbidities. Transportation for urodynamics may be an issue due to functional mobility but we can discuss this at a future date. I like to put her on trimethoprim 100 mg 30 x 11 and have her come back for cystoscopy. Will need to have reasonable treatment goals especially with immobility   Today She stable.  Last culture negative.  On further review of the medical record she had 2 positive cultures since November 2023.  She was taking the trimethoprim except for a week when she was on another antibiotic.  Now she feels that the urine is infected again with a lot of pressure.  She was reluctant to have cystoscopy or a catheter but decided to do both when we spoke.  On pelvic examination she grade 1 hypermobility the bladder neck.  No stress incontinence or prolapse with a good cough  Cystoscopy: Patient underwent flexible cystoscopy.  Bladder mucosa and  trigone normal.  Very well-tolerated.  Urine a bit cloudy with some white flecks.  No carcinoma.  Urine aspirated and sent for culture   PMH: Past Medical History:  Diagnosis Date   Anxiety    Cancer (HCC)    a spot on liver and treated    Complication of anesthesia    restless,easily upset   COPD (chronic obstructive pulmonary disease) (HCC)    Depression    Diabetes mellitus without complication (HCC)    Diverticulitis    Fatty liver    GERD (gastroesophageal reflux disease)    Headache(784.0)    migraines   History of kidney stones    Hyperlipidemia    Hypertension    Pneumonia    Restless    Stroke [facility])     Surgical History: Past Surgical History:  Procedure Laterality Date   ABDOMINAL HYSTERECTOMY     ACHILLES TENDON LENGTHENING Right 08/18/2017   Procedure: ACHILLES TENDON LENGTHENING;  Surgeon: Myrene Galas, MD;  [facility] OR;  Service: Orthopedics;  Laterality: Right;   ANTERIOR CERVICAL DECOMP/DISCECTOMY FUSION N/A 03/11/2012   Procedure: ANTERIOR CERVICAL DECOMPRESSION/DISCECTOMY FUSION 1 LEVEL;  Surgeon: Cristi Loron, MD;  [facility];  Service: Neurosurgery;  Laterality: N/A;  Cervical five-six anterior cervical decompression with fusion interbody prothesis plating and bonegraft   BACK SURGERY     EXTERNAL FIXATION LEG Right 08/10/2017   Procedure:  EXTERNAL FIXATION ANKLE;  Surgeon: Donato Heinz, MD;  Location: [facility] ORS;  Service: Orthopedics;  Laterality: Right;   EXTERNAL FIXATION REMOVAL Right 08/18/2017   Procedure: REMOVAL EXTERNAL FIXATION LEG;  Surgeon: Myrene Galas, MD;  [facility];  Service: Orthopedics;  Laterality: Right;   EYE SURGERY     HERNIA REPAIR     IR FL GUIDED LOC OF NEEDLE/CATH TIP FOR SPINAL INJECTION LT  12/09/2021   IR FL GUIDED LOC OF NEEDLE/CATH TIP FOR SPINAL INJECTION RT  12/09/2021   JOINT REPLACEMENT     bil knees   ORIF ANKLE FRACTURE Right 08/18/2017   Procedure: OPEN REDUCTION INTERNAL FIXATION (ORIF)  ANKLE FRACTURE;  Surgeon: Myrene Galas, MD;  [facility];  Service: Orthopedics;  Laterality: Right;   REPLACEMENT TOTAL KNEE BILATERAL      Home Medications:  Allergies as of 09/08/2022       Reactions   Tramadol Nausea And Vomiting, Hypertension   Augmentin [amoxicillin-pot Clavulanate] Rash        Medication List        Accurate as of September 08, 2022  2:16 PM. If you have any questions, ask your nurse or doctor.          albuterol 108 (90 Base) MCG/ACT inhaler Commonly known as: VENTOLIN HFA USE 2 INHALATIONS BY MOUTH EVERY 4 HOURS AS NEEDED FOR WHEEZING  OR SHORTNESS OF BREATH   atorvastatin 40 MG tablet Commonly known as: LIPITOR Take 1 tablet (40 mg total) by mouth at bedtime.   azelastine 0.1 % nasal spray Commonly known as: ASTELIN Place 2 sprays into both nostrils 2 (two) times daily.   buPROPion 150 MG 24 hr tablet Commonly known as: WELLBUTRIN XL Take 1 tablet (150 mg total) by mouth daily.   cephALEXin 500 MG capsule Commonly known as: KEFLEX Take 1 capsule (500 mg total) by mouth 3 (three) times daily. For 7 days   citalopram 40 MG tablet Commonly known as: CELEXA TAKE 1 TABLET BY MOUTH DAILY   clopidogrel 75 MG tablet Commonly known as: PLAVIX TAKE ONE TABLET BY MOUTH DAILY AT 9AM   fluconazole 100 MG tablet Commonly known as: Diflucan 1 tablet once daily x 3 days as needed   gabapentin 600 MG tablet Commonly known as: NEURONTIN TAKE 1 TABLET(600 MG) BY MOUTH FOUR TIMES DAILY   IBU 600 MG tablet Generic drug: ibuprofen Take 600 mg by mouth every 12 (twelve) hours as needed.   metFORMIN 1000 MG tablet Commonly known as: GLUCOPHAGE TAKE 1 TABLET(1000 MG) BY MOUTH TWICE DAILY WITH A MEAL   methocarbamol 500 MG tablet Commonly known as: ROBAXIN Take 1 tablet (500 mg total) by mouth 3 (three) times daily.   Mounjaro 10 MG/0.5ML Pen Generic drug: tirzepatide Inject 10 mg into the skin once a week.   nystatin powder Commonly  known as: MYCOSTATIN/NYSTOP APPLY 1 APPLICATION TOPICALLY THREE TIMES DAILY AS NEEDED FOR YEAST RASH INTERTRIGO   oxyCODONE-acetaminophen 10-325 MG tablet Commonly known as: PERCOCET Take 1 tablet by mouth every 8 (eight) hours as needed for pain.   pantoprazole 40 MG tablet Commonly known as: PROTONIX Take 1 tablet (40 mg total) by mouth daily.   QUEtiapine 100 MG tablet Commonly known as: SEROQUEL TAKE 1 TABLET BY MOUTH AT  BEDTIME   topiramate 100 MG tablet Commonly known as: TOPAMAX Take 1 tablet (100 mg total) by mouth 2 (two) times daily.   trimethoprim 100 MG tablet Commonly known as: TRIMPEX Take 1  tablet (100 mg total) by mouth daily.   Vitamin D (Ergocalciferol) 1.25 MG (50000 UNIT) Caps capsule Commonly known as: DRISDOL TAKE 1 CAPSULE BY MOUTH EVERY WEDNESDAY @9AM    VITAMIN D PO Take by mouth.        Allergies:  Allergies  Allergen Reactions   Tramadol Nausea And Vomiting and Hypertension   Augmentin [Amoxicillin-Pot Clavulanate] Rash    Family History: Family History  Problem Relation Age of Onset   Diabetes Mother    Hypertension Mother    Hypertension Father     Social History:  reports that she has been smoking cigarettes. She has a 20 pack-year smoking history. She has never used smokeless tobacco. She reports current drug use. Drug: Marijuana. She reports that she does not drink alcohol.  ROS:                                        Physical Exam: There were no vitals taken for this visit.  Constitutional:  Alert and oriented, No acute distress. HEENT: [place] AT, moist mucus membranes.  Trachea midline, no masses.  Laboratory Data: Lab Results  Component Value Date   WBC 8.7 12/11/2021   HGB 13.7 12/11/2021   HCT 42.0 12/11/2021   MCV 81.6 12/11/2021   PLT 314 12/11/2021    Lab Results  Component Value Date   CREATININE 0.90 08/20/2022    No results found for: "PSA"  No results found for:  "TESTOSTERONE"  Lab Results  Component Value Date   HGBA1C 5.2 05/06/2022    Urinalysis    Component Value Date/Time   COLORURINE YELLOW (A) 12/08/2021 2343   APPEARANCEUR Cloudy (A) 07/14/2022 1531   LABSPEC >1.046 (H) 12/08/2021 2343   LABSPEC 1.030 09/23/2013 0154   PHURINE 5.0 12/08/2021 2343   GLUCOSEU Negative 07/14/2022 1531   GLUCOSEU >=500 09/23/2013 0154   HGBUR NEGATIVE 12/08/2021 2343   BILIRUBINUR Negative 07/14/2022 1531   BILIRUBINUR Negative 09/23/2013 0154   KETONESUR 80 (A) 12/08/2021 2343   PROTEINUR Negative 07/14/2022 1531   PROTEINUR 30 (A) 12/08/2021 2343   UROBILINOGEN 0.2 04/21/2022 1445   UROBILINOGEN 0.2 03/11/2012 1234   NITRITE Negative 07/14/2022 1531   NITRITE NEGATIVE 12/08/2021 2343   LEUKOCYTESUR Trace (A) 07/14/2022 1531   LEUKOCYTESUR TRACE (A) 12/08/2021 2343   LEUKOCYTESUR Negative 09/23/2013 0154    Pertinent Imaging:   Assessment & Plan: Patient may have had a breakthrough infection on trimethoprim but she was off it for a week.  Call if culture positive and treated.  Otherwise reassess on Macrodantin prophylaxis and see if her incontinence down regulates and proceed accordingly.  To get postvoid residual next visit.  I also called in ciprofloxacin 250 mg twice a day for 7 days to have reasonable treatment goals  1. Mixed incontinence  - Urinalysis, Complete   No follow-ups on file.  Martina Sinner, MD  [facility] [address] [place], [place] [address] [phone]

## 2022-09-08 NOTE — Addendum Note (Signed)
Addended byRanda Lynn on: 09/08/2022 02:46 PM   Modules accepted: Orders

## 2022-09-08 NOTE — Addendum Note (Signed)
Addended by: Sueanne Margarita on: 09/08/2022 03:01 PM   Modules accepted: Orders

## 2022-09-09 ENCOUNTER — Ambulatory Visit: Payer: 59 | Admitting: Cardiology

## 2022-09-09 NOTE — Telephone Encounter (Signed)
Nelida Gores gave her verbal orders. She verbalized understanding.  KP

## 2022-09-09 NOTE — Telephone Encounter (Signed)
Home Health Verbal Orders - Caller/Agency: Corrie Dandy (Nurse) with [facility] Callback Number: [phone] Requesting : Skilled Nursing and PT Frequency: Corrie Dandy with [facility] states pt was due a re cert visit on yesterday 09/08/2022 but was unable to be seen due to having an empty appointment. Corrie Dandy is wanting verbal orders for Skilled Nursing and PT for the pt in order to go out to visit the patient.

## 2022-09-10 ENCOUNTER — Encounter: Payer: Self-pay | Admitting: Family Medicine

## 2022-09-11 LAB — CULTURE, URINE COMPREHENSIVE

## 2022-09-12 ENCOUNTER — Other Ambulatory Visit: Payer: Self-pay | Admitting: Family Medicine

## 2022-09-12 DIAGNOSIS — K219 Gastro-esophageal reflux disease without esophagitis: Secondary | ICD-10-CM

## 2022-09-16 ENCOUNTER — Encounter: Payer: Self-pay | Admitting: Family Medicine

## 2022-09-16 ENCOUNTER — Telehealth (INDEPENDENT_AMBULATORY_CARE_PROVIDER_SITE_OTHER): Payer: 59 | Admitting: Family Medicine

## 2022-09-16 VITALS — Ht 66.0 in | Wt 244.0 lb

## 2022-09-16 DIAGNOSIS — I739 Peripheral vascular disease, unspecified: Secondary | ICD-10-CM | POA: Diagnosis not present

## 2022-09-16 DIAGNOSIS — G8929 Other chronic pain: Secondary | ICD-10-CM

## 2022-09-16 DIAGNOSIS — I69951 Hemiplegia and hemiparesis following unspecified cerebrovascular disease affecting right dominant side: Secondary | ICD-10-CM

## 2022-09-16 DIAGNOSIS — M545 Low back pain, unspecified: Secondary | ICD-10-CM | POA: Diagnosis not present

## 2022-09-16 DIAGNOSIS — R531 Weakness: Secondary | ICD-10-CM | POA: Diagnosis not present

## 2022-09-16 DIAGNOSIS — N3946 Mixed incontinence: Secondary | ICD-10-CM

## 2022-09-16 DIAGNOSIS — N3944 Nocturnal enuresis: Secondary | ICD-10-CM

## 2022-09-16 DIAGNOSIS — L89301 Pressure ulcer of unspecified buttock, stage 1: Secondary | ICD-10-CM

## 2022-09-16 DIAGNOSIS — I6930 Unspecified sequelae of cerebral infarction: Secondary | ICD-10-CM

## 2022-09-16 DIAGNOSIS — R32 Unspecified urinary incontinence: Secondary | ICD-10-CM

## 2022-09-16 NOTE — Telephone Encounter (Signed)
Request from another mail ored pharmacy- attempted to call patient to verify need.No answer- remainder of Rx forwarded to new pharmacy. Requested Prescriptions  Pending Prescriptions Disp Refills   pantoprazole (PROTONIX) 40 MG tablet [Pharmacy Med Name: Pantoprazole Sodium 40 MG Oral Tablet Delayed Release] 90 tablet 3    Sig: TAKE 1 TABLET BY MOUTH DAILY AT  9 AM     Gastroenterology: Proton Pump Inhibitors Passed - 09/12/2022 10:03 PM      Passed - Valid encounter within last 12 months    Recent Outpatient Visits           4 months ago Chronic pain syndrome   [facility] [place], Netta Neat, DO   4 months ago Acute cystitis without hematuria   [facility] [place], Netta Neat, DO   8 months ago Hemiparesis of right dominant side as late effect of cerebrovascular disease, unspecified cerebrovascular disease type [facility])   [facility] [place], Netta Neat, DO   8 months ago Type 2 diabetes mellitus with other specified complication, without long-term current use of insulin (HCC)   [place] [facility] [place], Gentry Fitz A, RPH-CPP   9 months ago History of cerebrovascular accident (CVA) with residual deficit   [facility] [place], Netta Neat, DO       Future Appointments             Today Althea Charon, Netta Neat, DO [facility]   In 1 month Debbe Odea, MD [facility] [place]   In 2 months MacDiarmid, Lorin Picket, MD [facility]

## 2022-09-16 NOTE — Progress Notes (Addendum)
Subjective:    Patient ID: [name], female    DOB: May 14, 1965, 57 y.o.   MRN: [id number]  [name] is a 57 y.o. female presenting on 09/16/2022 for Medical Management of Chronic Issues  Virtual / Telehealth Encounter - Video Visit via MyChart The purpose of this virtual visit is to provide medical care while limiting exposure to the novel coronavirus (COVID19) for both patient and office staff.  Consent was obtained for remote visit:  Yes.   Answered questions that patient had about telehealth interaction:  Yes.   I discussed the limitations, risks, security and privacy concerns of performing an evaluation and management service by video/telephone. I also discussed with the patient that there may be a patient responsible charge related to this service. The patient expressed understanding and agreed to proceed.  Patient Location: Home Provider Location: [facility] (Office)  Participants in virtual visit: - Patient: KALEESE BRUNO - CMA: Darrol Angel CMA - Provider: Dr Althea Charon   HPI   MOBILITY EVALUATION  History of CVA with residual deficient Hemiparesis Hemiplegia R sided dominant Generalized Weakness PAD Chronic Low Back Pain Urinary Incontinence mixed Pressure sore stage 1   Today virtual visit to discuss recently completed Mobility Evaluation done by [employer] / evaluating therapist Rutherford Nail, on 08/05/22  She has significant mobility limitation due to various medical and physical issues that impact her ability to be mobile in and out of the home and it reduces her ability to perform ADLs and IADLs. Improved mobility allow her to get around house and reach cabinets and other areas that were out of reach to her.  Medical history includes history of CVA in 2015 with hemiparesis and hemiplegia affecting her Right dominant side. Multiple other physical orthopedic surgeries of spine and knee replacements TKR, ankle surgery, spinal surgery.  Her  current medical equipment is a generic scooter that is not able to assist with adequate support for her mobility impairment. She has developed some ulceration and pressure injury as a result.   She is requesting upgrade to new Power Mobility Device that can meet her needs and improve mobility, ADLs, IADLs and not cause issues with compression injury.  Additional needs today.  She will need an updated rx for Gel Overlay for hospital bed. She will benefit from this due to her limited mobility overall and inability to move in order to change body position. In addition she also has complication from urinary incontinence with mixed pattern and is being evaluated by Urologist by cystoscopy and therapy. She also has poor circulation with Peripheral Arterial Disease PAD and followed by Vascular specialist. She will need order for this DME to [employer].  Additionally  She will need Re-certification / renewal of PT OT services through [facility]. To resume service.    Past Surgical History:  Procedure Laterality Date   ABDOMINAL HYSTERECTOMY     ACHILLES TENDON LENGTHENING Right 08/18/2017   Procedure: ACHILLES TENDON LENGTHENING;  Surgeon: Myrene Galas, MD;  Location: [facility] OR;  Service: Orthopedics;  Laterality: Right;   ANTERIOR CERVICAL DECOMP/DISCECTOMY FUSION N/A 03/11/2012   Procedure: ANTERIOR CERVICAL DECOMPRESSION/DISCECTOMY FUSION 1 LEVEL;  Surgeon: Cristi Loron, MD;  Location: [facility] NEURO ORS;  Service: Neurosurgery;  Laterality: N/A;  Cervical five-six anterior cervical decompression with fusion interbody prothesis plating and bonegraft   BACK SURGERY     EXTERNAL FIXATION LEG Right 08/10/2017   Procedure: EXTERNAL FIXATION ANKLE;  Surgeon: Donato Heinz, MD;  Location:  [facility] ORS;  Service: Orthopedics;  Laterality: Right;   EXTERNAL FIXATION REMOVAL Right 08/18/2017   Procedure: REMOVAL EXTERNAL FIXATION LEG;  Surgeon: Myrene Galas, MD;  Location: [facility] OR;  Service: Orthopedics;   Laterality: Right;   EYE SURGERY     HERNIA REPAIR     IR FL GUIDED LOC OF NEEDLE/CATH TIP FOR SPINAL INJECTION LT  12/09/2021   IR FL GUIDED LOC OF NEEDLE/CATH TIP FOR SPINAL INJECTION RT  12/09/2021   JOINT REPLACEMENT     bil knees   ORIF ANKLE FRACTURE Right 08/18/2017   Procedure: OPEN REDUCTION INTERNAL FIXATION (ORIF) ANKLE FRACTURE;  Surgeon: Myrene Galas, MD;  Location: [facility] OR;  Service: Orthopedics;  Laterality: Right;   REPLACEMENT TOTAL KNEE BILATERAL     ----------------------------------------------------       05/16/2022    3:34 PM 05/06/2022    2:43 PM 05/15/2021    1:59 PM  Depression screen PHQ 2/9  Decreased Interest 1 2 2   Down, Depressed, Hopeless 1 0 0  PHQ - 2 Score 2 2 2   Altered sleeping 1 3 3   Tired, decreased energy 1 2 3   Change in appetite 0 2 3  Feeling bad or failure about yourself  0 0 0  Trouble concentrating 0 2 2  Moving slowly or fidgety/restless 0 1 2  Suicidal thoughts 0 0 0  PHQ-9 Score 4 12 15   Difficult doing work/chores Somewhat difficult Somewhat difficult Very difficult    Social History   Tobacco Use   Smoking status: Every Day    Current packs/day: 1.00    Average packs/day: 1 pack/day for 20.0 years (20.0 ttl pk-yrs)    Types: Cigarettes   Smokeless tobacco: Never  Vaping Use   Vaping status: Former  Substance Use Topics   Alcohol use: No   Drug use: Yes    Types: Marijuana    Comment: last smoked 2 days ago  8/4    Review of Systems Per HPI unless specifically indicated above     Objective:    Ht 5\' 6"  (1.676 m)   Wt 244 lb (110.7 kg)   BMI 39.38 kg/m   Wt Readings from Last 3 Encounters:  09/16/22 244 lb (110.7 kg)  08/27/22 244 lb (110.7 kg)  06/04/22 244 lb (110.7 kg)    Physical Exam  Note examination was completely remotely via video observation objective data only  Gen - well-appearing, obese HEENT - eyes appear clear without discharge or redness Heart/Lungs - cannot examine virtually - observed  no evidence of coughing or labored breathing. Abd - cannot examine virtually  Skin - face visible today- no rash  Addendum: Skin visualized gluteal region pressure sore stage 1  MSK - not able to assess virtually. I am reviewing the physical assessment performed on 08/05/22 by the PT at that time. Neuro - awake, alert, oriented Psych - not anxious appearing   Results for orders placed or performed in visit on 09/08/22  CULTURE, URINE COMPREHENSIVE   Specimen: Urine   UR  Result Value Ref Range   Urine Culture, Comprehensive Final report    Organism ID, Bacteria Comment   Microscopic Examination   Urine  Result Value Ref Range   WBC, UA 0-5 0 - 5 /hpf   RBC, Urine 11-30 (A) 0 - 2 /hpf   Epithelial Cells (non renal) 0-10 0 - 10 /hpf   Bacteria, UA Few None seen/Few  Urinalysis, Complete  Result Value Ref Range   Specific Gravity,  UA 1.020 1.005 - 1.030   pH, UA 5.5 5.0 - 7.5   Color, UA Yellow Yellow   Appearance Ur Clear Clear   Leukocytes,UA Negative Negative   Protein,UA Negative Negative/Trace   Glucose, UA Negative Negative   Ketones, UA Negative Negative   RBC, UA 2+ (A) Negative   Bilirubin, UA Negative Negative   Urobilinogen, Ur 0.2 0.2 - 1.0 mg/dL   Nitrite, UA Negative Negative   Microscopic Examination See below:       Assessment & Plan:   Problem List Items Addressed This Visit     Chronic low back pain without sciatica   Generalized weakness   Hemiparesis of right dominant side as late effect of cerebrovascular disease (HCC) - Primary   History of cerebrovascular accident (CVA) with residual deficit   Urinary incontinence   Other Visit Diagnoses     PAD (peripheral artery disease) (HCC)       Pressure injury of buttock, stage 1, unspecified laterality          Given her medical and physical problems identified in this note, she will benefit from Power Mobility Device order today.  PMD She will benefit from having power mobility base with power  tilt in order to independently relieve pressure and pain. See detailed list below on medical necessity for each feature.  She will benefit from additional features with vertical elevator to increase her independence by allowing her to complete ADL and IADl tasks such as getting dressed, meal prep, self feeding. As her current equipment does not allow her to reach certain standard heights in the home. She will medically benefit from elevating leg rests as well to improve circulation and reduce edema. All of these features will improve her independence and ability to care for herself and reduce risk of hospitalization  Detailed order information and needs:  [X]  Power Mobility Base - patient is non-ambulatory - cannot functionally propel manual wheelchair -cannot functionally and safely operate scooter/POV  [X]  Power Tilt feature This feature would allow patient to do the following: - Allow to change position for pressure relief / cannot shift her weight - Allow to change position against gravitational force on head and shoulders - Allow to decrease pain - Allow to reduce blood pressure management - Allow to decrease respiratory distress - Allow to facilitate postural control - Allow her rest periods - Allow her to control edema - Allow her to increase sitting tolerance - Aid with transfers in and out of chair  The ability to change position and shift weight with assistance of Power Tilt Feature will help patient offload pressure on Stage 1 Pressure ulceration of buttocks to improve healing.  [X]  Elevator on Mobility Base This will allow her to perform weight bearing transfers to/from the power wheelchair using upper extremities on uneven surfaces or lower extremities during sit to stand transfer. - Also allow perform non-weight bearing / dependent transfer  I have reviewed the detailed needs with regards to her equipment needs in the accompanying paperwork from the PT assessment and agree  with their recommendations.  She will also need Gel Overlay for hospital bed.  See above HPI, she has Limited mobility with poor circulation. And she has stage 1 pressure ulcer on buttocks with complication of urinary incontinence  Renewal of PT OT orders to continue service through [facility].  Medical Conditions: - The primary medical condition that impacts patient's mobility need is her status s/p history of cerebrovascular disease stroke 2015 with  R sided dominant hemiparesis and hemiplegia. Secondary conditions include orthopedic concerns with s/p TKR knee replacements and spinal surgery.  - Additional medical conditions contributing to his mobility, include diabetes type 2 and bladder dysfunction with mixed urinary incontinence.   MRADLs impaired in the home include: - PMD is necessary for patient's mobility to get to the bathroom for routine toilet use. - PMD is necessary for patient's mobility to get to the kitchen to prepare meals - PMD is necessary for patient's mobility to get to the bedroom to dress and sleep   Cane or Walker Patient cannot use a cane / walker due to R sided hemiplegia hemiparesis. Cannot properly safely use for mobility.   Manual Wheelchair Patient cannot use MWC due to hemiplegia and hemiparesis weakness limiting her ability to properly and safely use this device to propel movement   Scooter (POV) Patient cannot use a POV due to lack of postural stability, with hemiparesis and hemiplegia weakness in upper and lower extremities.   Patient can safely operate the power mobility device physically. She is mentally capable to operate the device.   Patient is willing and motivated to use the power mobility device in her home to improve her quality of life.   See attached order with requested paperwork to be faxed to [employer] after completion.  No orders of the defined types were placed in this encounter.     Follow up plan: No follow-ups on  file.   Patient verbalizes understanding with the above medical recommendations including the limitation of remote medical advice.  Specific follow-up and call-back criteria were given for patient to follow-up or seek medical care more urgently if needed.  Total duration of direct patient care provided via video conference: 30 minutes    Saralyn Pilar, DO [facility] 09/16/2022, 1:25 PM

## 2022-09-19 ENCOUNTER — Other Ambulatory Visit: Payer: Self-pay | Admitting: Family Medicine

## 2022-09-19 DIAGNOSIS — F331 Major depressive disorder, recurrent, moderate: Secondary | ICD-10-CM

## 2022-09-21 ENCOUNTER — Other Ambulatory Visit: Payer: Self-pay | Admitting: Family Medicine

## 2022-09-21 DIAGNOSIS — E1169 Type 2 diabetes mellitus with other specified complication: Secondary | ICD-10-CM

## 2022-09-22 ENCOUNTER — Telehealth: Payer: Self-pay | Admitting: Family Medicine

## 2022-09-22 ENCOUNTER — Ambulatory Visit: Payer: Self-pay

## 2022-09-22 NOTE — Telephone Encounter (Signed)
Chief Complaint: dizziness Symptoms: only when turning body side to side w/spinning of head or room that lasts 5 minutes (closing eyes makes everything back to normal), feels like will pass out when dizzy, nauseated when dizzy Frequency: onset 2 weeks ago Pertinent Negatives: Patient denies vomiting, other symptoms Disposition: [] ED /[] Urgent Care (no appt availability in office) / [x] Appointment(In office/virtual)/ []  [facility]/ [] Home Care/ [] Refused Recommended Disposition /[] [facility]/ []  Follow-up with PCP Additional Notes: Patient says when her husband turns her side to side to change her, that's when she's feeling dizzy and feeling of passing out with the spinning feeling that resolves after closing eyes and being on back x 5 minutes, no dizziness reported during the call. She says her BP has been low in the 80's for over a month, but Dr. Kirtland Bouchard is aware and has been working on it. Advised OV needed, she agreed. No availability with PCP until 10/08/22, no answer at the office when called FC. Advised I will send this to the office for Dr. Kirtland Bouchard to review and someone will call back with his recommendation regarding afternoon appointment.   Reason for Disposition  [1] MODERATE dizziness (e.g., interferes with normal activities) AND [2] has been evaluated by doctor (or NP/PA) for this  Answer Assessment - Initial Assessment Questions 1. DESCRIPTION: "Describe your dizziness."     Feel like will pass out when turning side to side 2. LIGHTHEADED: "Do you feel lightheaded?" (e.g., somewhat faint, woozy, weak upon standing)     Bedridden 3. VERTIGO: "Do you feel like either you or the room is spinning or tilting?" (i.e. vertigo)     Yes 4. SEVERITY: "How bad is it?"  "Do you feel like you are going to faint?" "Can you stand and walk?"   - MILD: Feels slightly dizzy, but walking normally.   - MODERATE: Feels unsteady when walking, but not falling; interferes with normal activities  (e.g., school, work).   - SEVERE: Unable to walk without falling, or requires assistance to walk without falling; feels like passing out now.      Severe only when turn side to side 5. ONSET:  "When did the dizziness begin?"     2 weeks ago 6. AGGRAVATING FACTORS: "Does anything make it worse?" (e.g., standing, change in head position)     Change position of head, turning whole body over side to side 7. HEART RATE: "Can you tell me your heart rate?" "How many beats in 15 seconds?"  (Note: not all patients can do this)       N/A 8. CAUSE: "What do you think is causing the dizziness?"     I don't know 9. RECURRENT SYMPTOM: "Have you had dizziness before?" If Yes, ask: "When was the last time?" "What happened that time?"     No 10. OTHER SYMPTOMS: "Do you have any other symptoms?" (e.g., fever, chest pain, vomiting, diarrhea, bleeding)       Headache, Low BP over a month at different times  Protocols used: Dizziness - Lightheadedness-A-AH

## 2022-09-22 NOTE — Telephone Encounter (Signed)
Okay that is fine. Thanks  Saralyn Pilar, DO [facility] 09/22/2022, 4:58 PM

## 2022-09-22 NOTE — Telephone Encounter (Signed)
Requested Prescriptions  Pending Prescriptions Disp Refills   buPROPion (WELLBUTRIN XL) 150 MG 24 hr tablet [Pharmacy Med Name: buPROPion HCl ER (XL) 150 MG Oral Tablet Extended Release 24 Hour] 90 tablet 0    Sig: TAKE 1 TABLET BY MOUTH DAILY AT  9AM     Psychiatry: Antidepressants - bupropion Passed - 09/19/2022 10:18 PM      Passed - Cr in normal range and within 360 days    Creat  Date Value Ref Range Status  05/15/2021 0.62 0.50 - 1.03 mg/dL Final   Creatinine, Ser  Date Value Ref Range Status  08/20/2022 0.90 0.44 - 1.00 mg/dL Final         Passed - AST in normal range and within 360 days    AST  Date Value Ref Range Status  12/09/2021 15 15 - 41 U/L Final   SGOT(AST)  Date Value Ref Range Status  09/22/2013 17 15 - 37 Unit/L Final         Passed - ALT in normal range and within 360 days    ALT  Date Value Ref Range Status  12/09/2021 9 0 - 44 U/L Final   SGPT (ALT)  Date Value Ref Range Status  09/22/2013 16 U/L Final    Comment:    14-63 NOTE: New Reference Range 08/02/13          Passed - Completed PHQ-2 or PHQ-9 in the last 360 days      Passed - Last BP in normal range    BP Readings from Last 1 Encounters:  08/27/22 100/72         Passed - Valid encounter within last 6 months    Recent Outpatient Visits           6 days ago Hemiparesis of right dominant side as late effect of cerebrovascular disease, unspecified cerebrovascular disease type [facility])   [place] [facility] Smitty Cords, DO   4 months ago Chronic pain syndrome   [place] [facility] Smitty Cords, DO   5 months ago Acute cystitis without hematuria   [place] [facility] [place], Netta Neat, DO   8 months ago Hemiparesis of right dominant side as late effect of cerebrovascular disease, unspecified cerebrovascular disease type [facility])   [place] [facility] [place], Netta Neat, DO    8 months ago Type 2 diabetes mellitus with other specified complication, without long-term current use of insulin (HCC)   [place] [facility] Delles, Jackelyn Poling, RPH-CPP       Future Appointments             In 4 weeks Agbor-Etang, Arlys John, MD [facility] [place]   In 2 months MacDiarmid, Lorin Picket, MD [facility]

## 2022-09-22 NOTE — Telephone Encounter (Signed)
[name] from [facility] called stated they were not able to do her therapy last week and would like to pick up this week. Please contact [name].

## 2022-09-23 ENCOUNTER — Other Ambulatory Visit: Payer: Self-pay | Admitting: Family Medicine

## 2022-09-23 DIAGNOSIS — E1169 Type 2 diabetes mellitus with other specified complication: Secondary | ICD-10-CM

## 2022-09-23 DIAGNOSIS — Z794 Long term (current) use of insulin: Secondary | ICD-10-CM

## 2022-09-23 DIAGNOSIS — G894 Chronic pain syndrome: Secondary | ICD-10-CM

## 2022-09-23 NOTE — Telephone Encounter (Signed)
[name] advised.   Thanks,   -Laura  

## 2022-09-23 NOTE — Telephone Encounter (Signed)
Pt is calling in because she saw on MyChart her refill for Greggory Keen is coming up soon and she wants it to go to [facility] home delivery instead of the community pharmacy.

## 2022-09-23 NOTE — Telephone Encounter (Signed)
Requested medication (s) are due for refill today: Yes  Requested medication (s) are on the active medication list: Yes  Last refill:  05/29/22  Future visit scheduled: No  Notes to clinic:  Manual review.    Requested Prescriptions  Pending Prescriptions Disp Refills   MOUNJARO 10 MG/0.5ML Pen [Pharmacy Med Name: MOUNJARO PEN 10MG /0.5ML] 6 mL 3    Sig: INJECT SUBCUTANEOUSLY 10 MG ONCE WEEKLY     Off-Protocol Failed - 09/21/2022  5:27 AM      Failed - Medication not assigned to a protocol, review manually.      Passed - Valid encounter within last 12 months    Recent Outpatient Visits           1 week ago Hemiparesis of right dominant side as late effect of cerebrovascular disease, unspecified cerebrovascular disease type [facility])   [place] [facility] [place], Netta Neat, DO   4 months ago Chronic pain syndrome   [place] [facility] [place], Netta Neat, DO   5 months ago Acute cystitis without hematuria   [place] [facility] [place], Netta Neat, DO   8 months ago Hemiparesis of right dominant side as late effect of cerebrovascular disease, unspecified cerebrovascular disease type [facility])   [place] [facility] [place], Netta Neat, DO   8 months ago Type 2 diabetes mellitus with other specified complication, without long-term current use of insulin (HCC)   [place] Delles, Jackelyn Poling, RPH-CPP       Future Appointments             In 3 weeks Agbor-Etang, Arlys John, MD [facility] [place]   In 2 months MacDiarmid, Lorin Picket, MD [facility]rology [facility]

## 2022-09-23 NOTE — Telephone Encounter (Signed)
AVS from 10/18/21 with Epley Maneuver instructions has been sent to patient via Mychart.

## 2022-09-23 NOTE — Telephone Encounter (Signed)
I called and spoke to patient.  She wants information on The Epley Maneuver to be sent in Mychart as she does not recall this.  She reports repeated concerns about her blood pressures being too low. She has it taken daily and almost every day it is low, for example a couple days ago it was 82/54. She states every time she goes to PT, they tell her that the BP is too low. This morning her blood pressure was more normal than it has been.

## 2022-09-23 NOTE — Telephone Encounter (Signed)
I don't believe she needs an urgent appointment today for Vertigo.  Her symptoms are very classic for positional vertigo.  I treated her for this before back in October 2023.  There is an office visit  10/18/21 that has info on the AVS about Home Epley Maneuever for treating Vertigo.  Please contact her and advise her to do the Epley Maneuever. We can put the info on her mychart if she needs or she can look it up as well.  I can order Meclizine as needed as well but that is only temporary medicine, it does not cure the vertigo  The Maneuever will resolve it repeat 2-3 times per day for 1 week or more.  If it fails we can refer to ENT for Vertigo  Saralyn Pilar, DO [facility] 09/23/2022, 10:09 AM

## 2022-09-23 NOTE — Telephone Encounter (Signed)
I called patient. She reports she cannot get into Mychart to access the information we just sent to her regarding Epley Maneuver. I suggested that she do a Microbiologist and watch some videos. She will do so.  She has an upcoming appointment with Cardiology on 10/20/22. I told her I am not sure that she can be seen any sooner than that, but I would let you know.

## 2022-09-23 NOTE — Telephone Encounter (Signed)
Thank you for contacting her and sending the Epley Maneuever on a [employer].  Regarding her BP I am aware of this. The issue is she does not take any medication for Blood Pressure. She has had lower BP due to weight loss.  The next step would be to get her established with Cardiology to discuss medications that can raise BP for situational use if she is active and we are trying to avoid low blood pressures.  Saralyn Pilar, DO [facility] 09/23/2022, 1:32 PM

## 2022-09-24 NOTE — Telephone Encounter (Signed)
Mounjaro 10 was sent to [facility] on 09/23/22 already.  Pharmacy  [facility] [place] - [address] [place] [address] Phone: [phone]  Fax: [phone]   No further action required.  Saralyn Pilar, DO [facility] 09/24/2022, 3:07 PM

## 2022-09-27 DIAGNOSIS — R54 Age-related physical debility: Secondary | ICD-10-CM | POA: Diagnosis not present

## 2022-09-27 DIAGNOSIS — I639 Cerebral infarction, unspecified: Secondary | ICD-10-CM | POA: Diagnosis not present

## 2022-09-27 DIAGNOSIS — Z993 Dependence on wheelchair: Secondary | ICD-10-CM | POA: Diagnosis not present

## 2022-09-27 DIAGNOSIS — M545 Low back pain, unspecified: Secondary | ICD-10-CM | POA: Diagnosis not present

## 2022-09-27 DIAGNOSIS — J449 Chronic obstructive pulmonary disease, unspecified: Secondary | ICD-10-CM | POA: Diagnosis not present

## 2022-09-29 DIAGNOSIS — G8929 Other chronic pain: Secondary | ICD-10-CM | POA: Diagnosis not present

## 2022-09-29 DIAGNOSIS — M25562 Pain in left knee: Secondary | ICD-10-CM | POA: Diagnosis not present

## 2022-09-29 DIAGNOSIS — Z79899 Other long term (current) drug therapy: Secondary | ICD-10-CM | POA: Diagnosis not present

## 2022-09-29 DIAGNOSIS — F17200 Nicotine dependence, unspecified, uncomplicated: Secondary | ICD-10-CM | POA: Diagnosis not present

## 2022-09-29 DIAGNOSIS — M5416 Radiculopathy, lumbar region: Secondary | ICD-10-CM | POA: Diagnosis not present

## 2022-09-29 DIAGNOSIS — M25561 Pain in right knee: Secondary | ICD-10-CM | POA: Diagnosis not present

## 2022-09-29 DIAGNOSIS — F17210 Nicotine dependence, cigarettes, uncomplicated: Secondary | ICD-10-CM | POA: Diagnosis not present

## 2022-09-29 DIAGNOSIS — E7800 Pure hypercholesterolemia, unspecified: Secondary | ICD-10-CM | POA: Diagnosis not present

## 2022-09-30 IMAGING — CT CT ABD-PELV W/ CM
2 of 5 series · 16 of 46 positions shown, 18 images · IV contrast (agent unspecified)
Comparison: September 23, 2013

CLINICAL DATA: Ventral hernia and abdominal pain sent for
preoperative surgical planning.

EXAM:
CT ABDOMEN AND PELVIS WITH CONTRAST
TECHNIQUE: Multidetector CT imaging of the abdomen and pelvis was performed
using the standard protocol following bolus administration of
intravenous contrast.

[Series 2: axial st · axial · 0.92mm/px · z∈[-966,-471]mm · 13 of 113 slices shown, 15 images]
[im 7/113  soft-tissue]
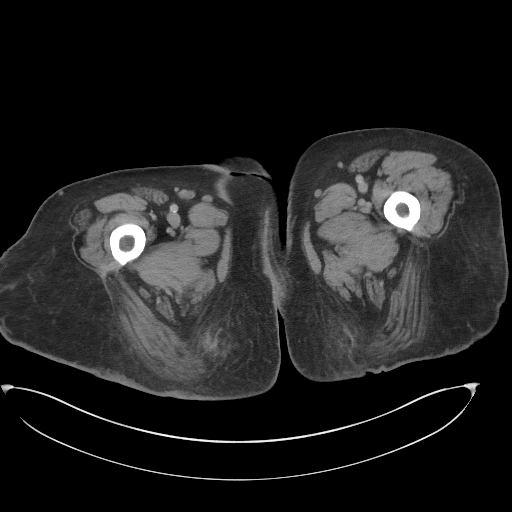
[im 7/113  bone]
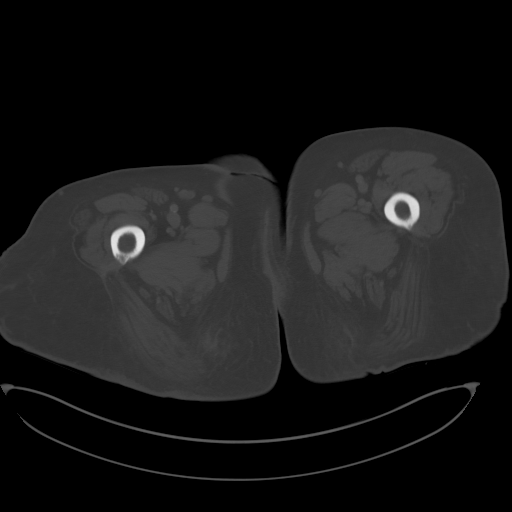
[im 13/113  soft-tissue]
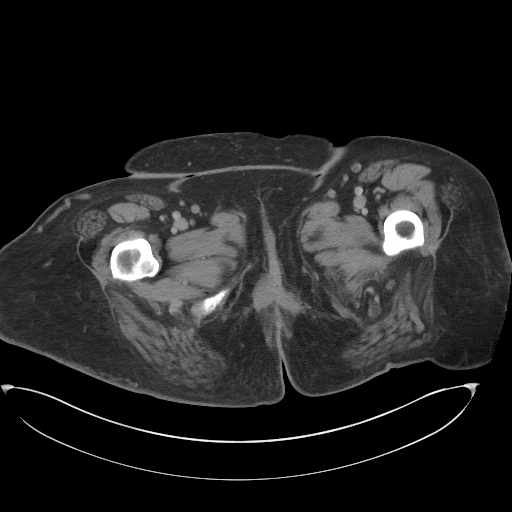
[im 25/113  soft-tissue]
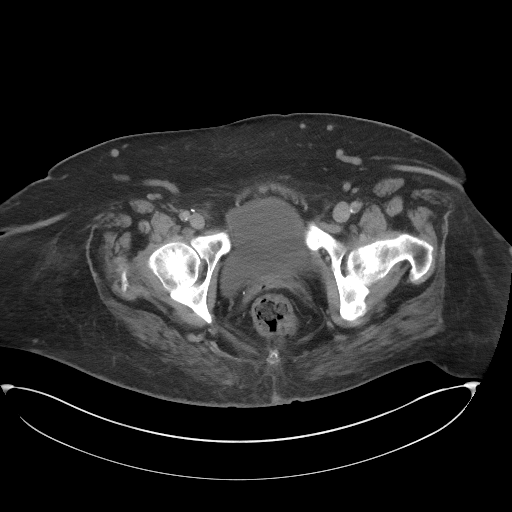
[im 32/113  soft-tissue]
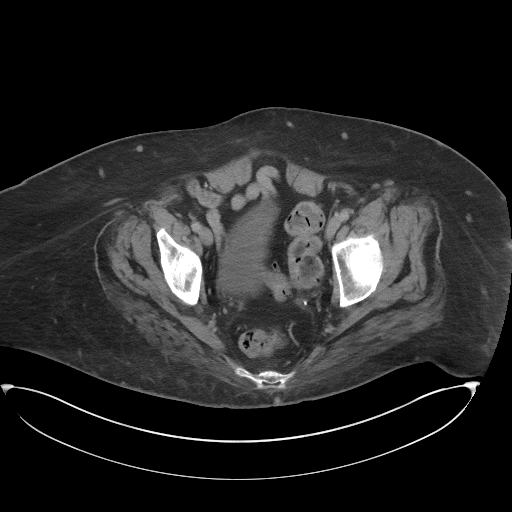
[im 38/113  soft-tissue]
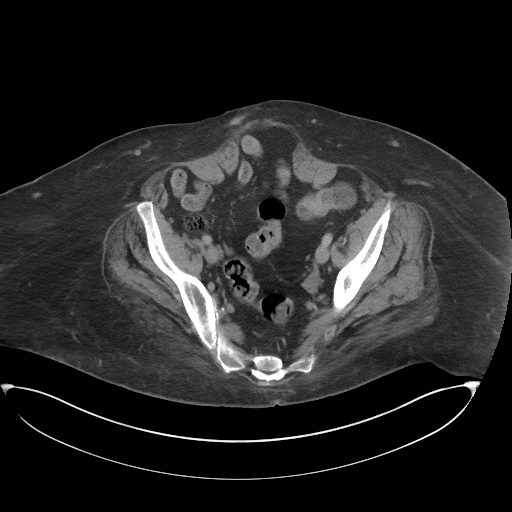
[im 50/113  soft-tissue]
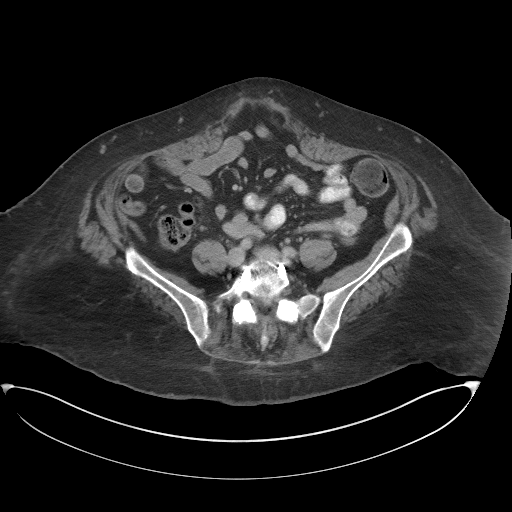
[im 57/113  soft-tissue]
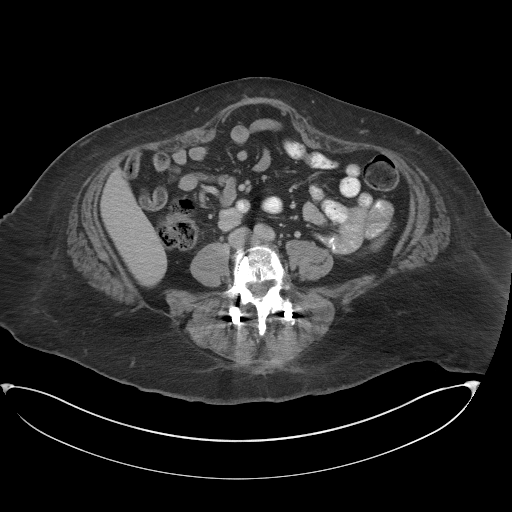
[im 63/113  soft-tissue]
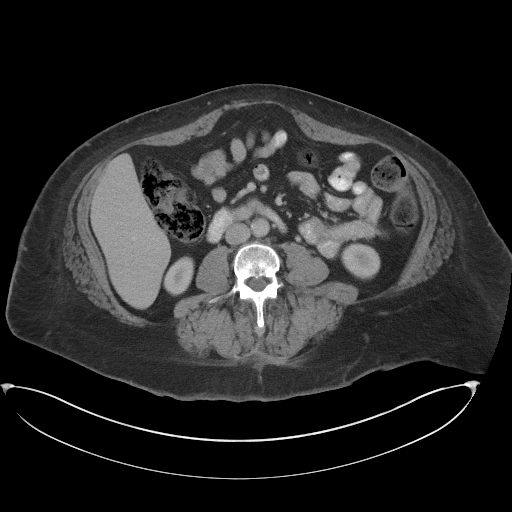
[im 75/113  soft-tissue]
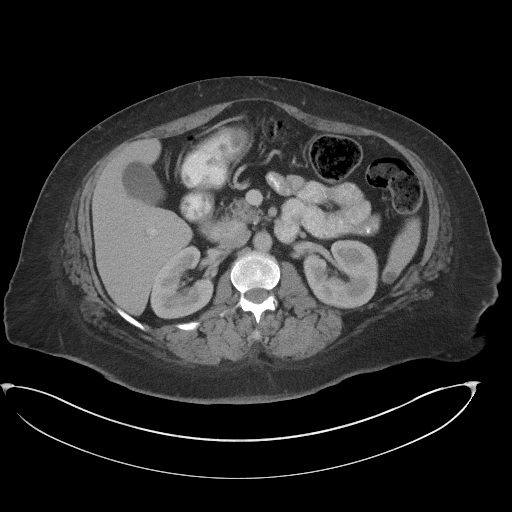
[im 75/113  bone]
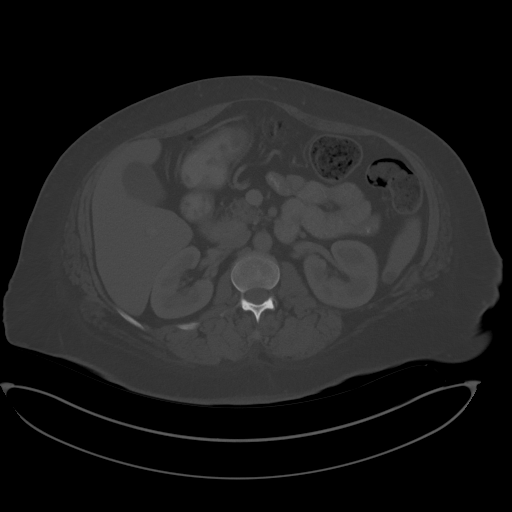
[im 81/113  soft-tissue]
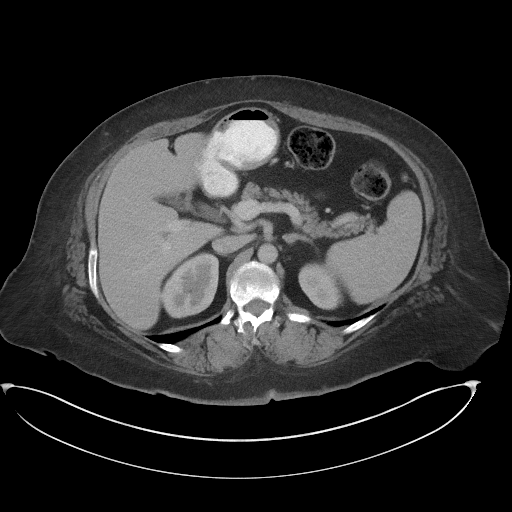
[im 88/113  soft-tissue]
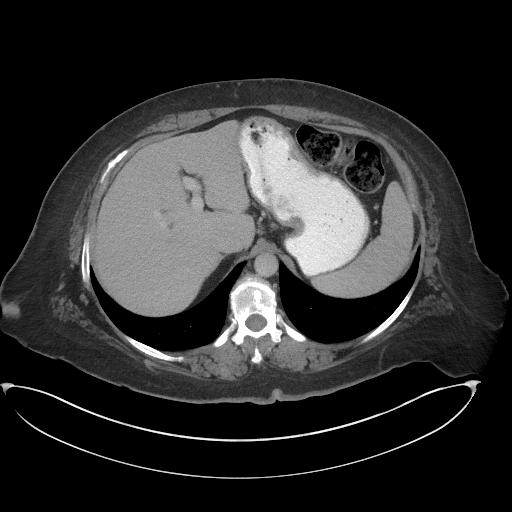
[im 100/113  soft-tissue]
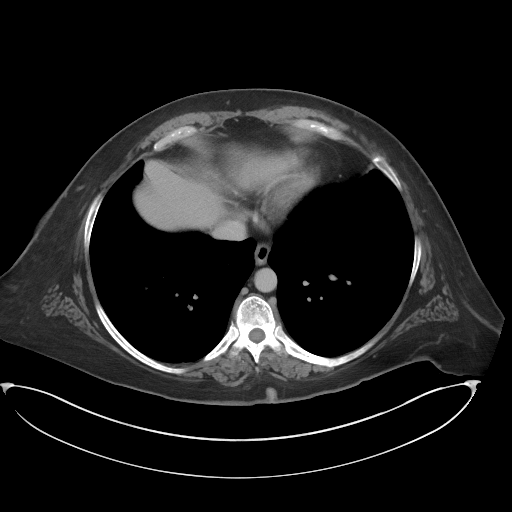
[im 106/113  soft-tissue]
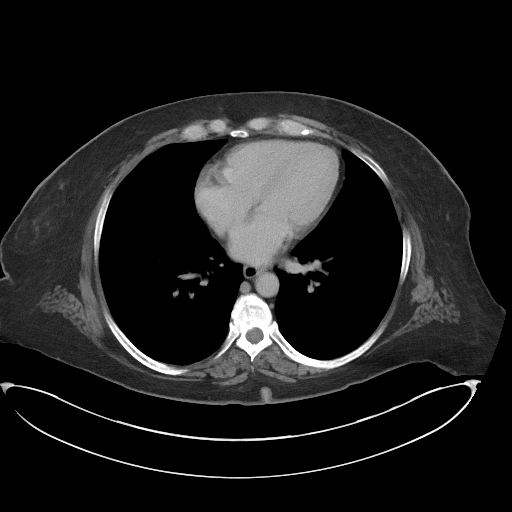

[Series 5: coronal st · coronal · 1.00mm/px · 3 of 107 slices shown]
[im 36/107  soft-tissue]
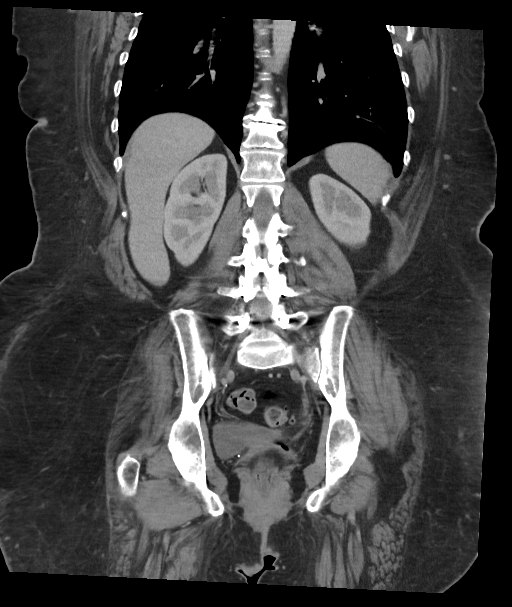
[im 48/107  soft-tissue]
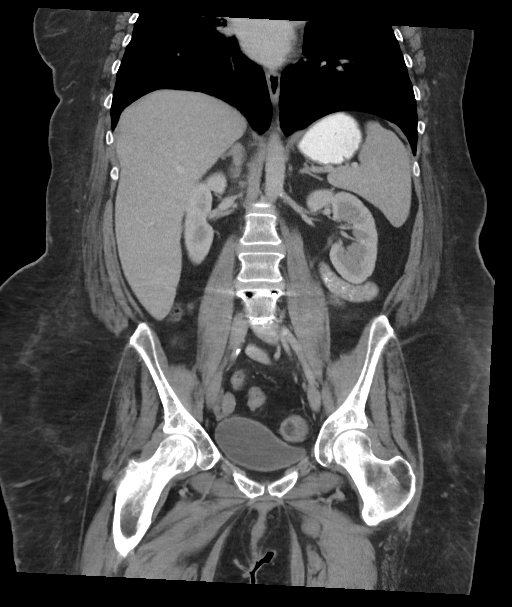
[im 59/107  soft-tissue]
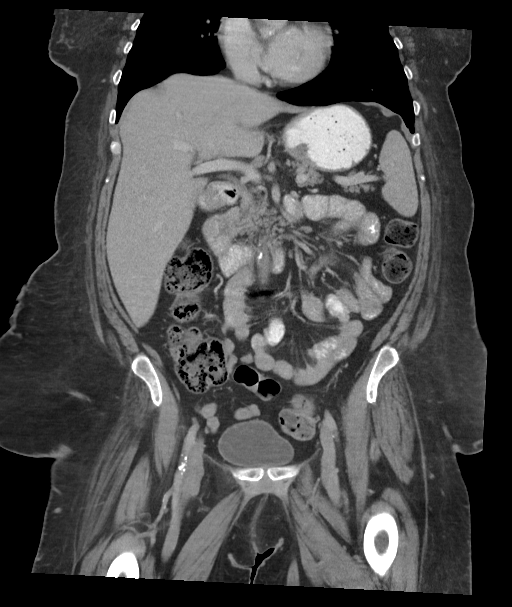

[16 of 46 positions shown; findings below may reference images not displayed]

RADIATION DOSE REDUCTION: This exam was performed according to the
departmental dose-optimization program which includes automated
exposure control, adjustment of the mA and/or kV according to
patient size and/or use of iterative reconstruction technique.

CONTRAST:  100mL OMNIPAQUE IOHEXOL 300 MG/ML  SOLN
FINDINGS: Lower chest: No acute abnormality.

Hepatobiliary: No focal liver abnormality is seen. No gallstones,
gallbladder wall thickening, or biliary dilatation.

Pancreas: Unremarkable. No pancreatic ductal dilatation or
surrounding inflammatory changes.

Spleen: Normal in size without focal abnormality.

Adrenals/Urinary Tract: Adrenal glands are unremarkable. Kidneys are
normal, without renal calculi, focal lesion, or hydronephrosis.
Bladder is unremarkable.

Stomach/Bowel: Stomach is within normal limits. The appendix is
poorly visualized. No evidence of bowel wall thickening, distention,
or inflammatory changes.

Vascular/Lymphatic: Aortic atherosclerosis. No enlarged abdominal or
pelvic lymph nodes.

Reproductive: Status post hysterectomy. No adnexal masses.

Other: A 3.0 cm x 7.2 cm ventral hernia is noted along the midline
of the lower abdominal, with a 5.8 cm x 1.8 cm component seen along
the midline of the pelvic wall. This area measures approximately
13.2 cm in length (sagittal reformatted image 75, CT series 6).

No abdominopelvic ascites.

Musculoskeletal: Bilateral metallic density pedicle screws are seen
at the levels of L4 and L5, with operative material noted within the
L4-L5 intervertebral disc space.
IMPRESSION: 1. Ventral hernia along the midline of the lower abdominal and
pelvic walls, as described above.
2. Postoperative changes within the lower lumbar spine.
3. Aortic atherosclerosis.

Aortic Atherosclerosis ([id number]-RPD.D).

## 2022-10-03 DIAGNOSIS — I639 Cerebral infarction, unspecified: Secondary | ICD-10-CM | POA: Diagnosis not present

## 2022-10-03 DIAGNOSIS — M545 Low back pain, unspecified: Secondary | ICD-10-CM | POA: Diagnosis not present

## 2022-10-03 DIAGNOSIS — J449 Chronic obstructive pulmonary disease, unspecified: Secondary | ICD-10-CM | POA: Diagnosis not present

## 2022-10-03 DIAGNOSIS — Z993 Dependence on wheelchair: Secondary | ICD-10-CM | POA: Diagnosis not present

## 2022-10-06 ENCOUNTER — Other Ambulatory Visit: Payer: Self-pay | Admitting: Family Medicine

## 2022-10-06 DIAGNOSIS — F5104 Psychophysiologic insomnia: Secondary | ICD-10-CM

## 2022-10-06 DIAGNOSIS — G43711 Chronic migraine without aura, intractable, with status migrainosus: Secondary | ICD-10-CM

## 2022-10-06 DIAGNOSIS — Z79899 Other long term (current) drug therapy: Secondary | ICD-10-CM | POA: Diagnosis not present

## 2022-10-09 ENCOUNTER — Encounter: Payer: Self-pay | Admitting: Family Medicine

## 2022-10-10 DIAGNOSIS — Z604 Social exclusion and rejection: Secondary | ICD-10-CM | POA: Diagnosis not present

## 2022-10-10 DIAGNOSIS — E785 Hyperlipidemia, unspecified: Secondary | ICD-10-CM | POA: Diagnosis not present

## 2022-10-10 DIAGNOSIS — Z7902 Long term (current) use of antithrombotics/antiplatelets: Secondary | ICD-10-CM | POA: Diagnosis not present

## 2022-10-10 DIAGNOSIS — I10 Essential (primary) hypertension: Secondary | ICD-10-CM | POA: Diagnosis not present

## 2022-10-10 DIAGNOSIS — Z9181 History of falling: Secondary | ICD-10-CM | POA: Diagnosis not present

## 2022-10-10 DIAGNOSIS — M5127 Other intervertebral disc displacement, lumbosacral region: Secondary | ICD-10-CM | POA: Diagnosis not present

## 2022-10-10 DIAGNOSIS — K219 Gastro-esophageal reflux disease without esophagitis: Secondary | ICD-10-CM | POA: Diagnosis not present

## 2022-10-10 DIAGNOSIS — Z7951 Long term (current) use of inhaled steroids: Secondary | ICD-10-CM | POA: Diagnosis not present

## 2022-10-10 DIAGNOSIS — J432 Centrilobular emphysema: Secondary | ICD-10-CM | POA: Diagnosis not present

## 2022-10-10 DIAGNOSIS — G43909 Migraine, unspecified, not intractable, without status migrainosus: Secondary | ICD-10-CM | POA: Diagnosis not present

## 2022-10-10 DIAGNOSIS — F17210 Nicotine dependence, cigarettes, uncomplicated: Secondary | ICD-10-CM | POA: Diagnosis not present

## 2022-10-10 DIAGNOSIS — I69351 Hemiplegia and hemiparesis following cerebral infarction affecting right dominant side: Secondary | ICD-10-CM | POA: Diagnosis not present

## 2022-10-10 DIAGNOSIS — Z556 Problems related to health literacy: Secondary | ICD-10-CM | POA: Diagnosis not present

## 2022-10-10 DIAGNOSIS — G894 Chronic pain syndrome: Secondary | ICD-10-CM | POA: Diagnosis not present

## 2022-10-10 DIAGNOSIS — M48061 Spinal stenosis, lumbar region without neurogenic claudication: Secondary | ICD-10-CM | POA: Diagnosis not present

## 2022-10-10 DIAGNOSIS — Z7984 Long term (current) use of oral hypoglycemic drugs: Secondary | ICD-10-CM | POA: Diagnosis not present

## 2022-10-10 DIAGNOSIS — E1151 Type 2 diabetes mellitus with diabetic peripheral angiopathy without gangrene: Secondary | ICD-10-CM | POA: Diagnosis not present

## 2022-10-10 DIAGNOSIS — K760 Fatty (change of) liver, not elsewhere classified: Secondary | ICD-10-CM | POA: Diagnosis not present

## 2022-10-10 DIAGNOSIS — Z87442 Personal history of urinary calculi: Secondary | ICD-10-CM | POA: Diagnosis not present

## 2022-10-10 DIAGNOSIS — Z7985 Long-term (current) use of injectable non-insulin antidiabetic drugs: Secondary | ICD-10-CM | POA: Diagnosis not present

## 2022-10-10 DIAGNOSIS — S31105D Unspecified open wound of abdominal wall, periumbilic region without penetration into peritoneal cavity, subsequent encounter: Secondary | ICD-10-CM | POA: Diagnosis not present

## 2022-10-14 DIAGNOSIS — I10 Essential (primary) hypertension: Secondary | ICD-10-CM | POA: Diagnosis not present

## 2022-10-14 DIAGNOSIS — Z9181 History of falling: Secondary | ICD-10-CM | POA: Diagnosis not present

## 2022-10-14 DIAGNOSIS — E1151 Type 2 diabetes mellitus with diabetic peripheral angiopathy without gangrene: Secondary | ICD-10-CM | POA: Diagnosis not present

## 2022-10-14 DIAGNOSIS — F17210 Nicotine dependence, cigarettes, uncomplicated: Secondary | ICD-10-CM | POA: Diagnosis not present

## 2022-10-14 DIAGNOSIS — Z556 Problems related to health literacy: Secondary | ICD-10-CM | POA: Diagnosis not present

## 2022-10-14 DIAGNOSIS — Z604 Social exclusion and rejection: Secondary | ICD-10-CM | POA: Diagnosis not present

## 2022-10-14 DIAGNOSIS — J432 Centrilobular emphysema: Secondary | ICD-10-CM | POA: Diagnosis not present

## 2022-10-14 DIAGNOSIS — K219 Gastro-esophageal reflux disease without esophagitis: Secondary | ICD-10-CM | POA: Diagnosis not present

## 2022-10-14 DIAGNOSIS — G43909 Migraine, unspecified, not intractable, without status migrainosus: Secondary | ICD-10-CM | POA: Diagnosis not present

## 2022-10-14 DIAGNOSIS — Z7951 Long term (current) use of inhaled steroids: Secondary | ICD-10-CM | POA: Diagnosis not present

## 2022-10-14 DIAGNOSIS — Z7985 Long-term (current) use of injectable non-insulin antidiabetic drugs: Secondary | ICD-10-CM | POA: Diagnosis not present

## 2022-10-14 DIAGNOSIS — I69351 Hemiplegia and hemiparesis following cerebral infarction affecting right dominant side: Secondary | ICD-10-CM | POA: Diagnosis not present

## 2022-10-14 DIAGNOSIS — M5127 Other intervertebral disc displacement, lumbosacral region: Secondary | ICD-10-CM | POA: Diagnosis not present

## 2022-10-14 DIAGNOSIS — S31105D Unspecified open wound of abdominal wall, periumbilic region without penetration into peritoneal cavity, subsequent encounter: Secondary | ICD-10-CM | POA: Diagnosis not present

## 2022-10-14 DIAGNOSIS — G894 Chronic pain syndrome: Secondary | ICD-10-CM | POA: Diagnosis not present

## 2022-10-14 DIAGNOSIS — K760 Fatty (change of) liver, not elsewhere classified: Secondary | ICD-10-CM | POA: Diagnosis not present

## 2022-10-14 DIAGNOSIS — M48061 Spinal stenosis, lumbar region without neurogenic claudication: Secondary | ICD-10-CM | POA: Diagnosis not present

## 2022-10-14 DIAGNOSIS — Z7902 Long term (current) use of antithrombotics/antiplatelets: Secondary | ICD-10-CM | POA: Diagnosis not present

## 2022-10-14 DIAGNOSIS — Z87442 Personal history of urinary calculi: Secondary | ICD-10-CM | POA: Diagnosis not present

## 2022-10-14 DIAGNOSIS — E785 Hyperlipidemia, unspecified: Secondary | ICD-10-CM | POA: Diagnosis not present

## 2022-10-14 DIAGNOSIS — Z7984 Long term (current) use of oral hypoglycemic drugs: Secondary | ICD-10-CM | POA: Diagnosis not present

## 2022-10-20 ENCOUNTER — Ambulatory Visit: Payer: 59 | Attending: Cardiology | Admitting: Cardiology

## 2022-10-20 ENCOUNTER — Ambulatory Visit: Payer: 59

## 2022-10-20 ENCOUNTER — Encounter: Payer: Self-pay | Admitting: Cardiology

## 2022-10-20 ENCOUNTER — Telehealth: Payer: Self-pay | Admitting: Cardiology

## 2022-10-20 ENCOUNTER — Other Ambulatory Visit: Payer: Self-pay

## 2022-10-20 VITALS — BP 76/60 | HR 108 | Ht 65.0 in

## 2022-10-20 DIAGNOSIS — F17200 Nicotine dependence, unspecified, uncomplicated: Secondary | ICD-10-CM

## 2022-10-20 DIAGNOSIS — I6930 Unspecified sequelae of cerebral infarction: Secondary | ICD-10-CM | POA: Diagnosis not present

## 2022-10-20 DIAGNOSIS — I950 Idiopathic hypotension: Secondary | ICD-10-CM

## 2022-10-20 MED ORDER — MIDODRINE HCL 5 MG PO TABS: 5.0000 mg | ORAL_TABLET | Freq: Three times a day (TID) | ORAL | 3 refills | Status: DC

## 2022-10-20 MED ORDER — MIDODRINE HCL 5 MG PO TABS: 5.0000 mg | ORAL_TABLET | Freq: Three times a day (TID) | ORAL | 0 refills | Status: DC

## 2022-10-20 NOTE — Patient Instructions (Signed)
Medication Instructions:   START Midodrine - Take one tablet (5mg ) by mouth three times a day.   *If you need a refill on your cardiac medications before your next appointment, please call your pharmacy*   Lab Work:  None Ordered  If you have labs (blood work) drawn today and your tests are completely normal, you will receive your results only by: MyChart Message (if you have MyChart) OR A paper copy in the mail If you have any lab test that is abnormal or we need to change your treatment, we will call you to review the results.   Testing/Procedures:  Your physician has requested that you have an echocardiogram. Echocardiography is a painless test that uses sound waves to create images of your heart. It provides your doctor with information about the size and shape of your heart and how well your heart's chambers and valves are working. This procedure takes approximately one hour. There are no restrictions for this procedure. Please do NOT wear cologne, perfume, aftershave, or lotions (deodorant is allowed). Please arrive 15 minutes prior to your appointment time.  Your physician has recommended that you wear a Zio monitor.   This monitor is a medical device that records the heart's electrical activity. Doctors most often use these monitors to diagnose arrhythmias. Arrhythmias are problems with the speed or rhythm of the heartbeat. The monitor is a small device applied to your chest. You can wear one while you do your normal daily activities. While wearing this monitor if you have any symptoms to push the button and record what you felt. Once you have worn this monitor for the period of time provider prescribed (Usually 14 days), you will return the monitor device in the postage paid box. Once it is returned they will download the data collected and provide [place] with a report which the provider will then review and we will call you with those results. Important tips:  Avoid showering during  the first 24 hours of wearing the monitor. Avoid excessive sweating to help maximize wear time. Do not submerge the device, no hot tubs, and no swimming pools. Keep any lotions or oils away from the patch. After 24 hours you may shower with the patch on. Take brief showers with your back facing the shower head.  Do not remove patch once it has been placed because that will interrupt data and decrease adhesive wear time. Push the button when you have any symptoms and write down what you were feeling. Once you have completed wearing your monitor, remove and place into box which has postage paid and place in your outgoing mailbox.  If for some reason you have misplaced your box then call our office and we can provide another box and/or mail it off for you.     Follow-Up: At [facility], you and your health needs are our priority.  As part of our continuing mission to provide you with exceptional heart care, we have created designated Provider Care Teams.  These Care Teams include your primary Cardiologist (physician) and Advanced Practice Providers (APPs -  Physician Assistants and Nurse Practitioners) who all work together to provide you with the care you need, when you need it.  We recommend signing up for the patient portal called "MyChart".  Sign up information is provided on this After Visit Summary.  MyChart is used to connect with patients for Virtual Visits (Telemedicine).  Patients are able to view lab/test results, encounter notes, upcoming appointments, etc.  Non-urgent  messages can be sent to your provider as well.   To learn more about what you can do with MyChart, go to [email or web address].    Your next appointment:     After Cardiac Testing  Provider:   You may see Debbe Odea, MD or one of the following Advanced Practice Providers on your designated Care Team:   Nicolasa Ducking, NP Eula Listen, PA-C Cadence Fransico Michael, PA-C Charlsie Quest, NP

## 2022-10-20 NOTE — Telephone Encounter (Signed)
Pt c/o medication issue:  1. Name of Medication: midodrine (PROAMATINE) 5 MG tablet   2. How are you currently taking this medication (dosage and times per day)?   Take 1 tablet (5 mg total) by mouth 3 (three) times daily with meals.    3. Are you having a reaction (difficulty breathing--STAT)?No  4. What is your medication issue? Patient is calling because she is supposed to start this new medication. We sent the medication prescription to the [facility] and patient is requesting that we resend the prescription to the [facility] [place], [place] - [address]

## 2022-10-20 NOTE — Progress Notes (Signed)
Cardiology Office Note:    Date:  10/20/2022   ID:  [name], DOB 10-Jun-1965, MRN [id number]  PCP:  Smitty Cords, DO   [facility] Providers Cardiologist:  Debbe Odea, MD     Referring MD: Saralyn Pilar *   Chief Complaint  Patient presents with   New Patient (Initial Visit)    Referred for cardiac evaluation of history of cerebrovascular accident, peripheral artery disease, and aortic atherosclerosis.  Reports new mid to right chest pain for 3 days.  Hypotensive on office check today and reports low home readings as well.    History of Present Illness:    [name] is a 57 y.o. female with a hx of hyperlipidemia, current smoker x 45+ years, PAD(significant right SFA/popliteal stenosis, mild to moderate narrowing of left CFA ), stroke 2013 who presents due to hypotension.  She is currently wheelchair-bound, endorses right leg and arm weakness after having a stroke in 2013.  Been trying to undergo physical therapy without significant improvement since.  Endorse having dizziness, blood pressures usually run low at home.  Systolics typically in the 80s.  Diagnosed with vertigo, advised on Epley maneuver by PCP.  Has been on Plavix ever since her stroke.  States feeling out of breath, fatigue most of the time.  Echo 03/2017 EF 60 to 65%  Past Medical History:  Diagnosis Date   Anxiety    Cancer (HCC)    a spot on liver and treated    Complication of anesthesia    restless,easily upset   COPD (chronic obstructive pulmonary disease) (HCC)    Depression    Diabetes mellitus without complication (HCC)    Diverticulitis    Fatty liver    GERD (gastroesophageal reflux disease)    Headache(784.0)    migraines   History of kidney stones    Hyperlipidemia    Hypertension    PAD (peripheral artery disease) (HCC)    Pneumonia    Restless    Stroke [facility])     Past Surgical History:  Procedure Laterality Date   ABDOMINAL HYSTERECTOMY      ACHILLES TENDON LENGTHENING Right 08/18/2017   Procedure: ACHILLES TENDON LENGTHENING;  Surgeon: Myrene Galas, MD;  Location: [facility] OR;  Service: Orthopedics;  Laterality: Right;   ANTERIOR CERVICAL DECOMP/DISCECTOMY FUSION N/A 03/11/2012   Procedure: ANTERIOR CERVICAL DECOMPRESSION/DISCECTOMY FUSION 1 LEVEL;  Surgeon: Cristi Loron, MD;  Location: [facility] NEURO ORS;  Service: Neurosurgery;  Laterality: N/A;  Cervical five-six anterior cervical decompression with fusion interbody prothesis plating and bonegraft   BACK SURGERY     EXTERNAL FIXATION LEG Right 08/10/2017   Procedure: EXTERNAL FIXATION ANKLE;  Surgeon: Donato Heinz, MD;  Location: [facility] ORS;  Service: Orthopedics;  Laterality: Right;   EXTERNAL FIXATION REMOVAL Right 08/18/2017   Procedure: REMOVAL EXTERNAL FIXATION LEG;  Surgeon: Myrene Galas, MD;  Location: [facility] OR;  Service: Orthopedics;  Laterality: Right;   EYE SURGERY     HERNIA REPAIR     IR FL GUIDED LOC OF NEEDLE/CATH TIP FOR SPINAL INJECTION LT  12/09/2021   IR FL GUIDED LOC OF NEEDLE/CATH TIP FOR SPINAL INJECTION RT  12/09/2021   JOINT REPLACEMENT     bil knees   ORIF ANKLE FRACTURE Right 08/18/2017   Procedure: OPEN REDUCTION INTERNAL FIXATION (ORIF) ANKLE FRACTURE;  Surgeon: Myrene Galas, MD;  Location: [facility] OR;  Service: Orthopedics;  Laterality: Right;   REPLACEMENT TOTAL KNEE BILATERAL      Current Medications:  Current Meds  Medication Sig   albuterol (VENTOLIN HFA) 108 (90 Base) MCG/ACT inhaler USE 2 INHALATIONS BY MOUTH EVERY 4 HOURS AS NEEDED FOR WHEEZING  OR SHORTNESS OF BREATH   atorvastatin (LIPITOR) 40 MG tablet Take 1 tablet (40 mg total) by mouth at bedtime.   azelastine (ASTELIN) 0.1 % nasal spray Place 2 sprays into both nostrils 2 (two) times daily.   buPROPion (WELLBUTRIN XL) 150 MG 24 hr tablet TAKE 1 TABLET BY MOUTH DAILY AT  9AM   citalopram (CELEXA) 40 MG tablet TAKE 1 TABLET BY MOUTH DAILY   clopidogrel (PLAVIX) 75 MG tablet TAKE ONE TABLET BY  MOUTH DAILY AT 9AM   cyclobenzaprine (FLEXERIL) 10 MG tablet Take 10 mg by mouth 3 (three) times daily.   gabapentin (NEURONTIN) 600 MG tablet TAKE 1 TABLET BY MOUTH 4 TIMES  DAILY   metFORMIN (GLUCOPHAGE) 1000 MG tablet TAKE 1 TABLET BY MOUTH TWICE  DAILY WITH MEALS   midodrine (PROAMATINE) 5 MG tablet Take 1 tablet (5 mg total) by mouth 3 (three) times daily with meals.   MOUNJARO 10 MG/0.5ML Pen INJECT SUBCUTANEOUSLY 10 MG ONCE WEEKLY   nitrofurantoin (MACRODANTIN) 100 MG capsule Take 1 capsule (100 mg total) by mouth daily.   nystatin (MYCOSTATIN/NYSTOP) powder APPLY 1 APPLICATION TOPICALLY THREE TIMES DAILY AS NEEDED FOR YEAST RASH INTERTRIGO   oxyCODONE-acetaminophen (PERCOCET) 10-325 MG tablet Take 1 tablet by mouth every 8 (eight) hours as needed for pain.   pantoprazole (PROTONIX) 40 MG tablet TAKE 1 TABLET BY MOUTH DAILY AT  9 AM   QUEtiapine (SEROQUEL) 100 MG tablet TAKE 1 TABLET BY MOUTH DAILY AT  9 PM AT BEDTIME   topiramate (TOPAMAX) 100 MG tablet TAKE 1 TABLET BY MOUTH TWICE  DAILY   TRELEGY ELLIPTA 100-62.5-25 MCG/ACT AEPB Inhale 1 puff into the lungs daily.   Vitamin D, Ergocalciferol, (DRISDOL) 1.25 MG (50000 UNIT) CAPS capsule TAKE 1 CAPSULE BY MOUTH EVERY WEDNESDAY @9AM      Allergies:   Tramadol and Augmentin [amoxicillin-pot clavulanate]   Social History   Socioeconomic History   Marital status: Married    Spouse name: Not on file   Number of children: Not on file   Years of education: Not on file   Highest education level: Not on file  Occupational History   Not on file  Tobacco Use   Smoking status: Every Day    Current packs/day: 1.00    Average packs/day: 1 pack/day for 20.0 years (20.0 ttl pk-yrs)    Types: Cigarettes   Smokeless tobacco: Never  Vaping Use   Vaping status: Never Used  Substance and Sexual Activity   Alcohol use: No   Drug use: Yes    Types: Marijuana, Oxycodone    Comment: last smoked 2 days ago  8/4   Sexual activity: Not on file   Other Topics Concern   Not on file  Social History Narrative   Not on file   Social Determinants of Health   Financial Resource Strain: Low Risk  (05/16/2022)   Overall Financial Resource Strain (CARDIA)    Difficulty of Paying Living Expenses: Not very hard  Food Insecurity: No Food Insecurity (05/16/2022)   Hunger Vital Sign    Worried About Running Out of Food in the Last Year: Never true    Ran Out of Food in the Last Year: Never true  Transportation Needs: No Transportation Needs (05/16/2022)   PRAPARE - Administrator, Civil Service (Medical): No  Lack of Transportation (Non-Medical): No  Physical Activity: Inactive (05/16/2022)   Exercise Vital Sign    Days of Exercise per Week: 0 days    Minutes of Exercise per Session: 0 min  Stress: Stress Concern Present (05/16/2022)   [employer] of Occupational Health - Occupational Stress Questionnaire    Feeling of Stress : To some extent  Social Connections: Unknown (05/26/2022)   [facility]   Social Network    Social Network: Not on file  Recent Concern: Social Connections - Moderately Isolated (05/16/2022)   Social Connection and Isolation Panel [NHANES]    Frequency of Communication with Friends and Family: More than three times a week    Frequency of Social Gatherings with Friends and Family: Once a week    Attends Religious Services: Never    Database administrator or Organizations: No    Attends Engineer, structural: Never    Marital Status: Married     Family History: The patient's family history includes Diabetes in her mother; Hypertension in her father and mother.  ROS:   Please see the history of present illness.     All other systems reviewed and are negative.  EKGs/Labs/Other Studies Reviewed:    The following studies were reviewed today:  EKG Interpretation Date/Time:  Monday October 20 2022 13:59:31 EDT Ventricular Rate:  108 PR Interval:  148 QRS  Duration:  76 QT Interval:  354 QTC Calculation: 474 R Axis:   -6  Text Interpretation: Sinus tachycardia Confirmed by Debbe Odea ([id number]) on 10/20/2022 2:07:12 PM    Recent Labs: 12/08/2021: TSH 0.921 12/09/2021: ALT 9 12/11/2021: BUN 12; Hemoglobin 13.7; Magnesium 2.0; Platelets 314; Potassium 3.6; Sodium 137 08/20/2022: Creatinine, Ser 0.90  Recent Lipid Panel    Component Value Date/Time   CHOL 136 04/07/2017 0828   CHOL 207 (H) 10/07/2012 0423   TRIG 191 (H) 04/07/2017 0828   TRIG 273 (H) 10/07/2012 0423   HDL 31 (L) 04/07/2017 0828   HDL 25 (L) 10/07/2012 0423   CHOLHDL 4.4 04/07/2017 0828   VLDL 38 04/07/2017 0828   VLDL 55 (H) 10/07/2012 0423   LDLCALC 67 04/07/2017 0828   LDLCALC 127 (H) 10/07/2012 0423     Risk Assessment/Calculations:             Physical Exam:    VS:  BP (!) 76/60 (BP Location: Left Arm, Patient Position: Sitting, Cuff Size: Large)   Pulse (!) 108   Ht 5\' 5"  (1.651 m)   SpO2 98%   BMI 40.60 kg/m     Wt Readings from Last 3 Encounters:  09/16/22 244 lb (110.7 kg)  08/27/22 244 lb (110.7 kg)  06/04/22 244 lb (110.7 kg)     GEN:  Well nourished, well developed in no acute distress HEENT: Normal NECK: No JVD; No carotid bruits CARDIAC: RRR, no murmurs, rubs, gallops RESPIRATORY: Diminished breath sounds bilaterally, no wheezing ABDOMEN: Soft, non-tender, non-distended MUSCULOSKELETAL:  No edema; sitting in wheelchair SKIN: Warm and dry NEUROLOGIC:  Alert and oriented x 3 PSYCHIATRIC:  Normal affect   ASSESSMENT:    1. History of cerebrovascular accident (CVA) with residual deficit   2. Idiopathic hypotension   3. Smoking    PLAN:    In order of problems listed above:  History of CVA, etiology possibly from smoking.  Continue Plavix, Lipitor.  Place cardiac monitor to evaluate any significant arrhythmias.  Obtain echo.  Being followed by vascular surgery for PAD.  Hypotension, systolics today 76, 80s at home.  Start  midodrine 5 mg 3 times daily. Current smoker, smoking cessation advised.  Follow-up after echo and cardiac monitor     Medication Adjustments/Labs and Tests Ordered: Current medicines are reviewed at length with the patient today.  Concerns regarding medicines are outlined above.  Orders Placed This Encounter  Procedures   LONG TERM MONITOR (3-14 DAYS)   EKG 12-Lead   ECHOCARDIOGRAM COMPLETE   Meds ordered this encounter  Medications   midodrine (PROAMATINE) 5 MG tablet    Sig: Take 1 tablet (5 mg total) by mouth 3 (three) times daily with meals.    Dispense:  270 tablet    Refill:  3    Patient Instructions  Medication Instructions:   START Midodrine - Take one tablet (5mg ) by mouth three times a day.   *If you need a refill on your cardiac medications before your next appointment, please call your pharmacy*   Lab Work:  None Ordered  If you have labs (blood work) drawn today and your tests are completely normal, you will receive your results only by: MyChart Message (if you have MyChart) OR A paper copy in the mail If you have any lab test that is abnormal or we need to change your treatment, we will call you to review the results.   Testing/Procedures:  Your physician has requested that you have an echocardiogram. Echocardiography is a painless test that uses sound waves to create images of your heart. It provides your doctor with information about the size and shape of your heart and how well your heart's chambers and valves are working. This procedure takes approximately one hour. There are no restrictions for this procedure. Please do NOT wear cologne, perfume, aftershave, or lotions (deodorant is allowed). Please arrive 15 minutes prior to your appointment time.  Your physician has recommended that you wear a Zio monitor.   This monitor is a medical device that records the heart's electrical activity. Doctors most often use these monitors to diagnose  arrhythmias. Arrhythmias are problems with the speed or rhythm of the heartbeat. The monitor is a small device applied to your chest. You can wear one while you do your normal daily activities. While wearing this monitor if you have any symptoms to push the button and record what you felt. Once you have worn this monitor for the period of time provider prescribed (Usually 14 days), you will return the monitor device in the postage paid box. Once it is returned they will download the data collected and provide [place] with a report which the provider will then review and we will call you with those results. Important tips:  Avoid showering during the first 24 hours of wearing the monitor. Avoid excessive sweating to help maximize wear time. Do not submerge the device, no hot tubs, and no swimming pools. Keep any lotions or oils away from the patch. After 24 hours you may shower with the patch on. Take brief showers with your back facing the shower head.  Do not remove patch once it has been placed because that will interrupt data and decrease adhesive wear time. Push the button when you have any symptoms and write down what you were feeling. Once you have completed wearing your monitor, remove and place into box which has postage paid and place in your outgoing mailbox.  If for some reason you have misplaced your box then call our office and we can provide another box and/or mail  it off for you.     Follow-Up: At [facility], you and your health needs are our priority.  As part of our continuing mission to provide you with exceptional heart care, we have created designated Provider Care Teams.  These Care Teams include your primary Cardiologist (physician) and Advanced Practice Providers (APPs -  Physician Assistants and Nurse Practitioners) who all work together to provide you with the care you need, when you need it.  We recommend signing up for the patient portal called "MyChart".  Sign up  information is provided on this After Visit Summary.  MyChart is used to connect with patients for Virtual Visits (Telemedicine).  Patients are able to view lab/test results, encounter notes, upcoming appointments, etc.  Non-urgent messages can be sent to your provider as well.   To learn more about what you can do with MyChart, go to [email or web address].    Your next appointment:     After Cardiac Testing  Provider:   You may see Debbe Odea, MD or one of the following Advanced Practice Providers on your designated Care Team:   Nicolasa Ducking, NP Eula Listen, PA-C Cadence Fransico Michael, PA-C Charlsie Quest, NP   Signed, Debbe Odea, MD  10/20/2022 3:28 PM    [facility]

## 2022-10-21 DIAGNOSIS — Z7902 Long term (current) use of antithrombotics/antiplatelets: Secondary | ICD-10-CM | POA: Diagnosis not present

## 2022-10-21 DIAGNOSIS — F17210 Nicotine dependence, cigarettes, uncomplicated: Secondary | ICD-10-CM | POA: Diagnosis not present

## 2022-10-21 DIAGNOSIS — S31105D Unspecified open wound of abdominal wall, periumbilic region without penetration into peritoneal cavity, subsequent encounter: Secondary | ICD-10-CM | POA: Diagnosis not present

## 2022-10-21 DIAGNOSIS — E1151 Type 2 diabetes mellitus with diabetic peripheral angiopathy without gangrene: Secondary | ICD-10-CM | POA: Diagnosis not present

## 2022-10-21 DIAGNOSIS — Z7984 Long term (current) use of oral hypoglycemic drugs: Secondary | ICD-10-CM | POA: Diagnosis not present

## 2022-10-21 DIAGNOSIS — Z556 Problems related to health literacy: Secondary | ICD-10-CM | POA: Diagnosis not present

## 2022-10-21 DIAGNOSIS — M5127 Other intervertebral disc displacement, lumbosacral region: Secondary | ICD-10-CM | POA: Diagnosis not present

## 2022-10-21 DIAGNOSIS — K219 Gastro-esophageal reflux disease without esophagitis: Secondary | ICD-10-CM | POA: Diagnosis not present

## 2022-10-21 DIAGNOSIS — Z604 Social exclusion and rejection: Secondary | ICD-10-CM | POA: Diagnosis not present

## 2022-10-21 DIAGNOSIS — Z7951 Long term (current) use of inhaled steroids: Secondary | ICD-10-CM | POA: Diagnosis not present

## 2022-10-21 DIAGNOSIS — Z7985 Long-term (current) use of injectable non-insulin antidiabetic drugs: Secondary | ICD-10-CM | POA: Diagnosis not present

## 2022-10-21 DIAGNOSIS — K760 Fatty (change of) liver, not elsewhere classified: Secondary | ICD-10-CM | POA: Diagnosis not present

## 2022-10-21 DIAGNOSIS — G43909 Migraine, unspecified, not intractable, without status migrainosus: Secondary | ICD-10-CM | POA: Diagnosis not present

## 2022-10-21 DIAGNOSIS — Z9181 History of falling: Secondary | ICD-10-CM | POA: Diagnosis not present

## 2022-10-21 DIAGNOSIS — I10 Essential (primary) hypertension: Secondary | ICD-10-CM | POA: Diagnosis not present

## 2022-10-21 DIAGNOSIS — E785 Hyperlipidemia, unspecified: Secondary | ICD-10-CM | POA: Diagnosis not present

## 2022-10-21 DIAGNOSIS — I69351 Hemiplegia and hemiparesis following cerebral infarction affecting right dominant side: Secondary | ICD-10-CM | POA: Diagnosis not present

## 2022-10-21 DIAGNOSIS — G894 Chronic pain syndrome: Secondary | ICD-10-CM | POA: Diagnosis not present

## 2022-10-21 DIAGNOSIS — M48061 Spinal stenosis, lumbar region without neurogenic claudication: Secondary | ICD-10-CM | POA: Diagnosis not present

## 2022-10-21 DIAGNOSIS — Z87442 Personal history of urinary calculi: Secondary | ICD-10-CM | POA: Diagnosis not present

## 2022-10-21 DIAGNOSIS — J432 Centrilobular emphysema: Secondary | ICD-10-CM | POA: Diagnosis not present

## 2022-10-23 ENCOUNTER — Other Ambulatory Visit: Payer: Self-pay | Admitting: Family Medicine

## 2022-10-23 DIAGNOSIS — F331 Major depressive disorder, recurrent, moderate: Secondary | ICD-10-CM

## 2022-10-23 NOTE — Telephone Encounter (Signed)
Requested Prescriptions  Pending Prescriptions Disp Refills   citalopram (CELEXA) 40 MG tablet [Pharmacy Med Name: Citalopram Hydrobromide 40 MG Oral Tablet] 100 tablet 2    Sig: TAKE 1 TABLET BY MOUTH DAILY     Psychiatry:  Antidepressants - SSRI Passed - 10/23/2022  5:14 AM      Passed - Completed PHQ-2 or PHQ-9 in the last 360 days      Passed - Valid encounter within last 6 months    Recent Outpatient Visits           1 month ago Hemiparesis of right dominant side as late effect of cerebrovascular disease, unspecified cerebrovascular disease type [facility])   [place] [facility] [place], Netta Neat, DO   5 months ago Chronic pain syndrome   [place] [facility] [place], Netta Neat, DO   6 months ago Acute cystitis without hematuria   [place] [facility] [place], Netta Neat, DO   9 months ago Hemiparesis of right dominant side as late effect of cerebrovascular disease, unspecified cerebrovascular disease type [facility])   [place] [facility] [place], Netta Neat, DO   9 months ago Type 2 diabetes mellitus with other specified complication, without long-term current use of insulin (HCC)   [place] [facility] [place], Jackelyn Poling, RPH-CPP       Future Appointments             In 1 month Furth, Cadence H, PA-C [facility] [place]   In 1 month MacDiarmid, Lorin Picket, MD [facility]

## 2022-10-24 DIAGNOSIS — I4719 Other supraventricular tachycardia: Secondary | ICD-10-CM

## 2022-10-24 DIAGNOSIS — I6930 Unspecified sequelae of cerebral infarction: Secondary | ICD-10-CM | POA: Diagnosis not present

## 2022-10-24 DIAGNOSIS — S31109A Unspecified open wound of abdominal wall, unspecified quadrant without penetration into peritoneal cavity, initial encounter: Secondary | ICD-10-CM | POA: Diagnosis not present

## 2022-10-27 ENCOUNTER — Encounter: Payer: Self-pay | Admitting: *Deleted

## 2022-10-27 ENCOUNTER — Telehealth: Payer: Self-pay

## 2022-10-27 DIAGNOSIS — M545 Low back pain, unspecified: Secondary | ICD-10-CM | POA: Diagnosis not present

## 2022-10-27 DIAGNOSIS — R54 Age-related physical debility: Secondary | ICD-10-CM | POA: Diagnosis not present

## 2022-10-27 DIAGNOSIS — F17200 Nicotine dependence, unspecified, uncomplicated: Secondary | ICD-10-CM | POA: Diagnosis not present

## 2022-10-27 DIAGNOSIS — Z79899 Other long term (current) drug therapy: Secondary | ICD-10-CM | POA: Diagnosis not present

## 2022-10-27 DIAGNOSIS — F17210 Nicotine dependence, cigarettes, uncomplicated: Secondary | ICD-10-CM | POA: Diagnosis not present

## 2022-10-27 DIAGNOSIS — Z993 Dependence on wheelchair: Secondary | ICD-10-CM | POA: Diagnosis not present

## 2022-10-27 DIAGNOSIS — E7800 Pure hypercholesterolemia, unspecified: Secondary | ICD-10-CM | POA: Diagnosis not present

## 2022-10-27 DIAGNOSIS — J449 Chronic obstructive pulmonary disease, unspecified: Secondary | ICD-10-CM | POA: Diagnosis not present

## 2022-10-27 DIAGNOSIS — I639 Cerebral infarction, unspecified: Secondary | ICD-10-CM | POA: Diagnosis not present

## 2022-10-27 DIAGNOSIS — M5416 Radiculopathy, lumbar region: Secondary | ICD-10-CM | POA: Diagnosis not present

## 2022-10-27 DIAGNOSIS — M25562 Pain in left knee: Secondary | ICD-10-CM | POA: Diagnosis not present

## 2022-10-27 DIAGNOSIS — M25561 Pain in right knee: Secondary | ICD-10-CM | POA: Diagnosis not present

## 2022-10-27 DIAGNOSIS — G8929 Other chronic pain: Secondary | ICD-10-CM | POA: Diagnosis not present

## 2022-10-27 NOTE — Telephone Encounter (Signed)
Copied from CRM [id number]. Topic: General - Inquiry >> Oct 27, 2022  1:52 PM Patsy Lager T wrote: Reason for CRM: patient called stated she can not get in and out of her car and need to see if provider can give the home health nurse the shot to give to her. Please f/u with patient

## 2022-10-27 NOTE — Progress Notes (Signed)
Please consider ordering a urine albumin/creatinine ratio lab test to assist in closing the KED gap Thanks

## 2022-10-27 NOTE — Telephone Encounter (Signed)
I would suggest Flu Vaccine in our parking lot. We can administer Flu vaccine here at [facility] if patient is in our parking lot in the car.  If she wants COVID vaccine or both she can try the pharmacy and have family member communicate with the pharmacy to have her signed up and scheduled, let them know, they may be able to do parking lot as well.  I don't know how to physically give the vaccines to a home health nurse, alternatively her home health nurse may be able to work with her pharmacy too.  Saralyn Pilar, DO [facility] 10/27/2022, 5:12 PM

## 2022-10-28 DIAGNOSIS — Z7902 Long term (current) use of antithrombotics/antiplatelets: Secondary | ICD-10-CM | POA: Diagnosis not present

## 2022-10-28 DIAGNOSIS — G43909 Migraine, unspecified, not intractable, without status migrainosus: Secondary | ICD-10-CM | POA: Diagnosis not present

## 2022-10-28 DIAGNOSIS — E1151 Type 2 diabetes mellitus with diabetic peripheral angiopathy without gangrene: Secondary | ICD-10-CM | POA: Diagnosis not present

## 2022-10-28 DIAGNOSIS — Z7985 Long-term (current) use of injectable non-insulin antidiabetic drugs: Secondary | ICD-10-CM | POA: Diagnosis not present

## 2022-10-28 DIAGNOSIS — Z9181 History of falling: Secondary | ICD-10-CM | POA: Diagnosis not present

## 2022-10-28 DIAGNOSIS — F17210 Nicotine dependence, cigarettes, uncomplicated: Secondary | ICD-10-CM | POA: Diagnosis not present

## 2022-10-28 DIAGNOSIS — I69351 Hemiplegia and hemiparesis following cerebral infarction affecting right dominant side: Secondary | ICD-10-CM | POA: Diagnosis not present

## 2022-10-28 DIAGNOSIS — M5127 Other intervertebral disc displacement, lumbosacral region: Secondary | ICD-10-CM | POA: Diagnosis not present

## 2022-10-28 DIAGNOSIS — I10 Essential (primary) hypertension: Secondary | ICD-10-CM | POA: Diagnosis not present

## 2022-10-28 DIAGNOSIS — Z604 Social exclusion and rejection: Secondary | ICD-10-CM | POA: Diagnosis not present

## 2022-10-28 DIAGNOSIS — Z556 Problems related to health literacy: Secondary | ICD-10-CM | POA: Diagnosis not present

## 2022-10-28 DIAGNOSIS — K760 Fatty (change of) liver, not elsewhere classified: Secondary | ICD-10-CM | POA: Diagnosis not present

## 2022-10-28 DIAGNOSIS — Z87442 Personal history of urinary calculi: Secondary | ICD-10-CM | POA: Diagnosis not present

## 2022-10-28 DIAGNOSIS — E785 Hyperlipidemia, unspecified: Secondary | ICD-10-CM | POA: Diagnosis not present

## 2022-10-28 DIAGNOSIS — Z7951 Long term (current) use of inhaled steroids: Secondary | ICD-10-CM | POA: Diagnosis not present

## 2022-10-28 DIAGNOSIS — Z7984 Long term (current) use of oral hypoglycemic drugs: Secondary | ICD-10-CM | POA: Diagnosis not present

## 2022-10-28 DIAGNOSIS — G894 Chronic pain syndrome: Secondary | ICD-10-CM | POA: Diagnosis not present

## 2022-10-28 DIAGNOSIS — J432 Centrilobular emphysema: Secondary | ICD-10-CM | POA: Diagnosis not present

## 2022-10-28 DIAGNOSIS — M48061 Spinal stenosis, lumbar region without neurogenic claudication: Secondary | ICD-10-CM | POA: Diagnosis not present

## 2022-10-28 DIAGNOSIS — K219 Gastro-esophageal reflux disease without esophagitis: Secondary | ICD-10-CM | POA: Diagnosis not present

## 2022-10-28 DIAGNOSIS — S31105D Unspecified open wound of abdominal wall, periumbilic region without penetration into peritoneal cavity, subsequent encounter: Secondary | ICD-10-CM | POA: Diagnosis not present

## 2022-10-28 NOTE — Telephone Encounter (Signed)
I spoke to patient and we have scheduled her to have the flu shot on 10/31/22 and I will meet them in parking lot. She is appreciative for our help.

## 2022-10-29 ENCOUNTER — Other Ambulatory Visit: Payer: Self-pay

## 2022-10-29 DIAGNOSIS — E782 Mixed hyperlipidemia: Secondary | ICD-10-CM

## 2022-10-29 DIAGNOSIS — E1169 Type 2 diabetes mellitus with other specified complication: Secondary | ICD-10-CM

## 2022-10-29 NOTE — Progress Notes (Signed)
Patient scheduled.

## 2022-10-30 ENCOUNTER — Other Ambulatory Visit: Payer: Self-pay

## 2022-10-30 ENCOUNTER — Ambulatory Visit: Payer: 59

## 2022-10-30 DIAGNOSIS — Z79899 Other long term (current) drug therapy: Secondary | ICD-10-CM | POA: Diagnosis not present

## 2022-10-30 DIAGNOSIS — E1169 Type 2 diabetes mellitus with other specified complication: Secondary | ICD-10-CM

## 2022-10-30 DIAGNOSIS — E782 Mixed hyperlipidemia: Secondary | ICD-10-CM

## 2022-10-31 ENCOUNTER — Ambulatory Visit (INDEPENDENT_AMBULATORY_CARE_PROVIDER_SITE_OTHER): Payer: 59

## 2022-10-31 ENCOUNTER — Other Ambulatory Visit: Payer: 59

## 2022-10-31 DIAGNOSIS — E1169 Type 2 diabetes mellitus with other specified complication: Secondary | ICD-10-CM

## 2022-10-31 DIAGNOSIS — E782 Mixed hyperlipidemia: Secondary | ICD-10-CM

## 2022-10-31 DIAGNOSIS — Z23 Encounter for immunization: Secondary | ICD-10-CM

## 2022-11-04 ENCOUNTER — Other Ambulatory Visit: Payer: Self-pay | Admitting: Family Medicine

## 2022-11-04 DIAGNOSIS — E782 Mixed hyperlipidemia: Secondary | ICD-10-CM

## 2022-11-04 DIAGNOSIS — Z794 Long term (current) use of insulin: Secondary | ICD-10-CM

## 2022-11-04 DIAGNOSIS — E1169 Type 2 diabetes mellitus with other specified complication: Secondary | ICD-10-CM | POA: Diagnosis not present

## 2022-11-05 ENCOUNTER — Other Ambulatory Visit: Payer: Self-pay | Admitting: Cardiology

## 2022-11-05 DIAGNOSIS — G894 Chronic pain syndrome: Secondary | ICD-10-CM | POA: Diagnosis not present

## 2022-11-05 DIAGNOSIS — I69351 Hemiplegia and hemiparesis following cerebral infarction affecting right dominant side: Secondary | ICD-10-CM | POA: Diagnosis not present

## 2022-11-05 DIAGNOSIS — Z604 Social exclusion and rejection: Secondary | ICD-10-CM | POA: Diagnosis not present

## 2022-11-05 DIAGNOSIS — Z9181 History of falling: Secondary | ICD-10-CM | POA: Diagnosis not present

## 2022-11-05 DIAGNOSIS — M48061 Spinal stenosis, lumbar region without neurogenic claudication: Secondary | ICD-10-CM | POA: Diagnosis not present

## 2022-11-05 DIAGNOSIS — Z7951 Long term (current) use of inhaled steroids: Secondary | ICD-10-CM | POA: Diagnosis not present

## 2022-11-05 DIAGNOSIS — F17210 Nicotine dependence, cigarettes, uncomplicated: Secondary | ICD-10-CM | POA: Diagnosis not present

## 2022-11-05 DIAGNOSIS — G43909 Migraine, unspecified, not intractable, without status migrainosus: Secondary | ICD-10-CM | POA: Diagnosis not present

## 2022-11-05 DIAGNOSIS — K760 Fatty (change of) liver, not elsewhere classified: Secondary | ICD-10-CM | POA: Diagnosis not present

## 2022-11-05 DIAGNOSIS — Z87442 Personal history of urinary calculi: Secondary | ICD-10-CM | POA: Diagnosis not present

## 2022-11-05 DIAGNOSIS — Z7985 Long-term (current) use of injectable non-insulin antidiabetic drugs: Secondary | ICD-10-CM | POA: Diagnosis not present

## 2022-11-05 DIAGNOSIS — Z7984 Long term (current) use of oral hypoglycemic drugs: Secondary | ICD-10-CM | POA: Diagnosis not present

## 2022-11-05 DIAGNOSIS — I950 Idiopathic hypotension: Secondary | ICD-10-CM

## 2022-11-05 DIAGNOSIS — K219 Gastro-esophageal reflux disease without esophagitis: Secondary | ICD-10-CM | POA: Diagnosis not present

## 2022-11-05 DIAGNOSIS — F17200 Nicotine dependence, unspecified, uncomplicated: Secondary | ICD-10-CM

## 2022-11-05 DIAGNOSIS — E785 Hyperlipidemia, unspecified: Secondary | ICD-10-CM | POA: Diagnosis not present

## 2022-11-05 DIAGNOSIS — M5127 Other intervertebral disc displacement, lumbosacral region: Secondary | ICD-10-CM | POA: Diagnosis not present

## 2022-11-05 DIAGNOSIS — I6930 Unspecified sequelae of cerebral infarction: Secondary | ICD-10-CM

## 2022-11-05 DIAGNOSIS — I10 Essential (primary) hypertension: Secondary | ICD-10-CM | POA: Diagnosis not present

## 2022-11-05 DIAGNOSIS — Z556 Problems related to health literacy: Secondary | ICD-10-CM | POA: Diagnosis not present

## 2022-11-05 DIAGNOSIS — J432 Centrilobular emphysema: Secondary | ICD-10-CM | POA: Diagnosis not present

## 2022-11-05 DIAGNOSIS — S31105D Unspecified open wound of abdominal wall, periumbilic region without penetration into peritoneal cavity, subsequent encounter: Secondary | ICD-10-CM | POA: Diagnosis not present

## 2022-11-05 DIAGNOSIS — Z7902 Long term (current) use of antithrombotics/antiplatelets: Secondary | ICD-10-CM | POA: Diagnosis not present

## 2022-11-05 DIAGNOSIS — E1151 Type 2 diabetes mellitus with diabetic peripheral angiopathy without gangrene: Secondary | ICD-10-CM | POA: Diagnosis not present

## 2022-11-05 LAB — MICROALBUMIN, URINE: Microalb, Ur: 0.2 mg/dL

## 2022-11-06 ENCOUNTER — Other Ambulatory Visit: Payer: Self-pay

## 2022-11-12 ENCOUNTER — Ambulatory Visit: Payer: 59 | Attending: Cardiology

## 2022-11-13 ENCOUNTER — Ambulatory Visit: Payer: Self-pay

## 2022-11-13 NOTE — Telephone Encounter (Signed)
  Chief Complaint: seizure Symptoms: possible seizure, body tensed up, stops breathing loses consciousness, then comes to and feels confused for a min, loss of appetite  Frequency: 1 month, 3x, last one last Thursday  Pertinent Negatives: NA Disposition: [] ED /[] Urgent Care (no appt availability in office) / [x] Appointment(In office/virtual)/ []  [place] [facility]/ [] Home Care/ [] Refused Recommended Disposition /[] [place] [facility]/ []  Follow-up with PCP Additional Notes: pt preferred VV but advised d/t report of above sx pt would need to be seen in person. Also advised pt if episode happens again to go to ED right away. Scheduled OV 11/19/22 at 1440. Added appt to waitlist.   Summary: Loss of appetite   Patient has not had an appetite for about a month now, needs virtual appt. Best contact: [phone]         Reason for Disposition  [1] Seizure of unknown duration AND [2] history of prior seizure(s) AND [3] back to baseline with no new concerning symptoms  Answer Assessment - Initial Assessment Questions 1. ONSET: "When did the seizure occur?"     3 times within 1 month last 1 last Thursday  2. DURATION: "How long did the seizure last (or how long has it been happening)?" (e.g., seconds, minutes)  Note: Most seizures last less than 5 minutes.     Less than 5 mins  3. DESCRIPTION: "Describe what happened during the seizure." "Did the body become stiff?" "Was there any jerking?"  "Did they lose consciousness during the seizure?"     Body gets really tense, loses consciousness and then comes to  5. MENTAL STATUS AFTER SEIZURE: "Does the person seem more groggy or sleepy?" "Does the person know who they are, who you are, and where they are now?"      Feels confused after for a few mins  6. PRIOR SEIZURES: "Has the person had a seizure (convulsion) before?" (e.g., epilepsy, other cause)  If Yes, ask: "When was the last time?" and "What happened last time?"      no 9. INJURY: "Was  the person hurt or injured during the seizure?" (e.g., hit their head, bit their tongue)     no 10. OTHER SYMPTOMS: "Are there any other symptoms?" (e.g., fever, headache)       Loss of appetite  Protocols used: [employer]

## 2022-11-18 ENCOUNTER — Encounter: Payer: Self-pay | Admitting: Cardiology

## 2022-11-18 ENCOUNTER — Other Ambulatory Visit: Payer: 59

## 2022-11-19 ENCOUNTER — Ambulatory Visit (INDEPENDENT_AMBULATORY_CARE_PROVIDER_SITE_OTHER): Payer: 59 | Admitting: Family Medicine

## 2022-11-19 VITALS — BP 122/78 | HR 115 | Wt 230.0 lb

## 2022-11-19 DIAGNOSIS — R569 Unspecified convulsions: Secondary | ICD-10-CM

## 2022-11-19 DIAGNOSIS — G8194 Hemiplegia, unspecified affecting left nondominant side: Secondary | ICD-10-CM

## 2022-11-19 DIAGNOSIS — I69951 Hemiplegia and hemiparesis following unspecified cerebrovascular disease affecting right dominant side: Secondary | ICD-10-CM

## 2022-11-19 DIAGNOSIS — M25511 Pain in right shoulder: Secondary | ICD-10-CM | POA: Diagnosis not present

## 2022-11-19 DIAGNOSIS — I6930 Unspecified sequelae of cerebral infarction: Secondary | ICD-10-CM

## 2022-11-19 DIAGNOSIS — M67911 Unspecified disorder of synovium and tendon, right shoulder: Secondary | ICD-10-CM

## 2022-11-19 DIAGNOSIS — G8929 Other chronic pain: Secondary | ICD-10-CM | POA: Diagnosis not present

## 2022-11-19 NOTE — Progress Notes (Signed)
Subjective:    Patient ID: [name], female    DOB: 13-Apr-1965, 57 y.o.   MRN: [id number]  [name] is a 57 y.o. female presenting on 11/19/2022 for Seizures (Possible seizure last week )   HPI  Discussed the use of AI scribe software for clinical note transcription with the patient, who gave verbal consent to proceed.  Seizure-Like Activity Generalized Muscle Spasms Possible temporary abrupt withdrawal off Gabapentin  The patient, with a history of prior cerebrovascular accident (CVA) affecting the right side, presented with new-onset seizure-like episodes and worsening left-sided weakness. The patient reported three episodes of sudden loss of consciousness, each lasting less than a minute, over the course of a week. These episodes were characterized by associated eye-rolling and body-wide muscle spasms, including the arms and legs. Each episode was preceded by a coughing fit while the patient was at rest. The patient reported a brief period of disorientation following each episode, lasting only a few seconds, with no prolonged grogginess or confusion. One episode occurred per day, for 3 days of the week.  The patient also reported a history of missing doses of gabapentin for three days prior to the onset of these episodes. The patient has been on gabapentin for a long time, and it is unclear whether the abrupt discontinuation of the medication contributed to these episodes. No further episodes since resumed Gabapentin.  In addition to the seizure-like episodes, the patient reported worsening left-sided weakness, which is a new development compared to the chronic right-sided weakness from the prior CVA >1 yr ago. The patient expressed concern about a possible past history of 2nd stroke, unsure when. No new weakness or focal problem recently, given the Left sided weakness that is gradual and chronic now in nature too.  Right Shoulder Pain Rotator Cuff tendinopathy  Patient  reported chronic right shoulder pain, suspected to be due to a rotator cuff injury. The pain has been present for a while but has recently worsened, causing discomfort up into the neck. The patient reported difficulty with lifting and transitioning positions, and a decreased range of motion, particularly in lifting the arm and moving it behind the back. The patient has been managing the pain without corticosteroid injections due to a history of diabetes and a desire to avoid potential blood sugar spikes.  Obesity BMI >38 Mounjaro 10mg  weekly Improved wt loss Unable to get on scale but losing weight significantly and doing well on [facility]      05/16/2022    3:34 PM 05/06/2022    2:43 PM 05/15/2021    1:59 PM  Depression screen PHQ 2/9  Decreased Interest 1 2 2   Down, Depressed, Hopeless 1 0 0  PHQ - 2 Score 2 2 2   Altered sleeping 1 3 3   Tired, decreased energy 1 2 3   Change in appetite 0 2 3  Feeling bad or failure about yourself  0 0 0  Trouble concentrating 0 2 2  Moving slowly or fidgety/restless 0 1 2  Suicidal thoughts 0 0 0  PHQ-9 Score 4 12 15   Difficult doing work/chores Somewhat difficult Somewhat difficult Very difficult    Social History   Tobacco Use   Smoking status: Every Day    Current packs/day: 1.00    Average packs/day: 1 pack/day for 20.0 years (20.0 ttl pk-yrs)    Types: Cigarettes   Smokeless tobacco: Never  Vaping Use   Vaping status: Never Used  Substance Use Topics   Alcohol use: No  Drug use: Yes    Types: Marijuana, Oxycodone    Comment: last smoked 2 days ago  8/4    Review of Systems Per HPI unless specifically indicated above     Objective:    BP 122/78   Pulse (!) 115   Wt 230 lb (104.3 kg) Comment: Estimated from home measurement. She could not get on scale today due to weakness  SpO2 96%   BMI 38.27 kg/m   Wt Readings from Last 3 Encounters:  11/19/22 230 lb (104.3 kg)  09/16/22 244 lb (110.7 kg)  08/27/22 244 lb (110.7 kg)     Physical Exam Vitals and nursing note reviewed.  Constitutional:      General: She is not in acute distress.    Appearance: Normal appearance. She is well-developed. She is obese. She is not diaphoretic.     Comments: Improved wt loss. Well-appearing, comfortable, cooperative  HENT:     Head: Normocephalic and atraumatic.  Eyes:     General:        Right eye: No discharge.        Left eye: No discharge.     Conjunctiva/sclera: Conjunctivae normal.  Cardiovascular:     Rate and Rhythm: Normal rate.  Pulmonary:     Effort: Pulmonary effort is normal.  Musculoskeletal:     Comments: R Shoulder Reduced range of motion, only 45* forward flex, and difficulty abduction without resistance. Unable to internal rotate behind back. Positive rotator cuff weakness on exam and positive impingement.  Wheelchair bound.  Right sided upper and lower extremity chronic weakness   Skin:    General: Skin is warm and dry.     Findings: No erythema or rash.  Neurological:     Mental Status: She is alert and oriented to person, place, and time.  Psychiatric:        Mood and Affect: Mood normal.        Behavior: Behavior normal.        Thought Content: Thought content normal.     Comments: Well groomed, good eye contact, normal speech and thoughts    I have personally reviewed the radiology report from 12/09/21 on MRI Brain.  CLINICAL DATA:  Altered mental status   EXAM: MRI HEAD WITHOUT CONTRAST   TECHNIQUE: Multiplanar, multiecho pulse sequences of the brain and surrounding structures were obtained without intravenous contrast.   COMPARISON:  04/07/2017 MRI, correlation is also made with CT head 12/08/2021   FINDINGS: Evaluation is significantly limited by motion artifact. In addition, the study could not be completed due to patient pain and motion.   Brain: No restricted diffusion to suggest acute or subacute infarct. No definite acute hemorrhage, mass, mass effect, or midline  shift. No hydrocephalus or definite extra-axial collection. Lacunar infarcts in the left basal ganglia and pons.   Vascular: Grossly normal arterial flow voids.   Skull and upper cervical spine: Unable to evaluate.   Sinuses/Orbits: Clear paranasal sinuses. No acute finding in the orbits.   Other: None.   IMPRESSION: Evaluation is limited by motion artifact and early termination. Within this limitation, no acute intracranial process. No evidence of acute or subacute infarct.     Electronically Signed   By: Wiliam Ke M.D.   On: 12/09/2021 01:58  Results for orders placed or performed in visit on 11/04/22  Microalbumin, urine  Result Value Ref Range   Microalb, Ur 0.2 mg/dL   RAM        Assessment & Plan:  Problem List Items Addressed This Visit     Hemiparesis of right dominant side as late effect of cerebrovascular disease (HCC)   Relevant Orders   Ambulatory referral to Neurology   History of cerebrovascular accident (CVA) with residual deficit   Relevant Orders   Ambulatory referral to Neurology   Other Visit Diagnoses     Seizure-like activity [facility])    -  Primary   Relevant Orders   Ambulatory referral to Neurology   Chronic right shoulder pain       Relevant Orders   Ambulatory referral to Orthopedic Surgery   Tendinopathy of right rotator cuff       Relevant Orders   Ambulatory referral to Orthopedic Surgery   Left hemiparesis [facility])       Relevant Orders   Ambulatory referral to Neurology       Seizure-like episodes   Three episodes of loss of consciousness associated with coughing, followed by brief confusion and muscle spasms. No prior history of seizures.   Possible association with abrupt discontinuation of Gabapentin.   Reassurance given no recurrent episodes since resumed Gabapentin  -We discussed less likely actual seizure activity. But patient is interested to rule it out, and pursue further eval, see below -Refer to neurology for  further evaluation and possible EEG.  Rotator cuff tendinopathy, Right Chronic Pain Right shoulder pain and limited range of motion Worsening over the past few weeks to months. History of prior stroke affecting right side weakness No recent imaging or injury Concern with repetitive strain on shoulder from frequent transitions that are difficult due to her weakness Declines injection or additional med at this time She will likely need X-ray and further advanced imaging per Ortho -Refer to orthopedics for further evaluation and possible imaging.    History of CVA w/ residual deficient, weakness R Hemiparesis Prior stroke with residual weakness, now with new neurological symptoms (seizure-like episodes).   No focal deficit that is acute to suggest any new recent stroke. We discussed her concern about evaluating to see if prior stroke in the past 1 year, now contributing to worsening LEFT sided weakness.  -Referral to Neurology for eval and imaging. -Consider re-evaluation of brain with MRI to compare with prior imaging from November 2023.    General Health Maintenance   Noted weight loss, patient reports feeling progress.   -Continue current medications, including Mounjaro, as she seems to be effective.        Orders Placed This Encounter  Procedures   Ambulatory referral to Neurology    Referral Priority:   Routine    Referral Type:   Consultation    Referral Reason:   Specialty Services Required    Requested Specialty:   Neurology    Number of Visits Requested:   1   Ambulatory referral to Orthopedic Surgery    Referral Priority:   Routine    Referral Type:   Surgical    Referral Reason:   Specialty Services Required    Requested Specialty:   Orthopedic Surgery    Number of Visits Requested:   1     No orders of the defined types were placed in this encounter.     Follow up plan: Return if symptoms worsen or fail to improve.   Saralyn Pilar, DO [facility] 11/19/2022, 2:29 PM

## 2022-11-19 NOTE — Patient Instructions (Addendum)
Thank you for coming to the office today.  Referral to [facility] for evaluation for possible seizures and re-evaluation of brain with history of prior CVA. To compare to 11/2021 last imaging.  [facility] - Neurology Dept [address] [place], [place] [address] Phone: [phone]  --------------------------------  [facility] ORTHOPEDICS & [facility] MEDICINE [facility] [address] [place], [place]  [address] Phone: [phone]  Preferred [facility] location  [facility] [address] [place], [place]  [address] Phone: [phone]    Please schedule a Follow-up Appointment to: Return if symptoms worsen or fail to improve.  If you have any other questions or concerns, please feel free to call the office or send a message through MyChart. You may also schedule an earlier appointment if necessary.  Additionally, you may be receiving a survey about your experience at our office within a few days to 1 week by e-mail or mail. We value your feedback.  Saralyn Pilar, DO [facility] [place]

## 2022-11-24 ENCOUNTER — Other Ambulatory Visit: Payer: Self-pay | Admitting: Family Medicine

## 2022-11-24 DIAGNOSIS — Z79899 Other long term (current) drug therapy: Secondary | ICD-10-CM | POA: Diagnosis not present

## 2022-11-24 DIAGNOSIS — G8929 Other chronic pain: Secondary | ICD-10-CM | POA: Diagnosis not present

## 2022-11-24 DIAGNOSIS — M25562 Pain in left knee: Secondary | ICD-10-CM | POA: Diagnosis not present

## 2022-11-24 DIAGNOSIS — M25561 Pain in right knee: Secondary | ICD-10-CM | POA: Diagnosis not present

## 2022-11-24 DIAGNOSIS — E7800 Pure hypercholesterolemia, unspecified: Secondary | ICD-10-CM | POA: Diagnosis not present

## 2022-11-24 DIAGNOSIS — F17210 Nicotine dependence, cigarettes, uncomplicated: Secondary | ICD-10-CM | POA: Diagnosis not present

## 2022-11-24 DIAGNOSIS — K219 Gastro-esophageal reflux disease without esophagitis: Secondary | ICD-10-CM

## 2022-11-24 DIAGNOSIS — M5416 Radiculopathy, lumbar region: Secondary | ICD-10-CM | POA: Diagnosis not present

## 2022-11-24 DIAGNOSIS — F17200 Nicotine dependence, unspecified, uncomplicated: Secondary | ICD-10-CM | POA: Diagnosis not present

## 2022-11-26 NOTE — Telephone Encounter (Signed)
Requested Prescriptions  Pending Prescriptions Disp Refills   pantoprazole (PROTONIX) 40 MG tablet [Pharmacy Med Name: Pantoprazole Sodium 40 MG Oral Tablet Delayed Release] 100 tablet 0    Sig: TAKE 1 TABLET BY MOUTH DAILY AT  9 AM     Gastroenterology: Proton Pump Inhibitors Passed - 11/24/2022 11:01 PM      Passed - Valid encounter within last 12 months    Recent Outpatient Visits           1 week ago Seizure-like activity [facility])   [place] [facility] [place], Netta Neat, DO   2 months ago Hemiparesis of right dominant side as late effect of cerebrovascular disease, unspecified cerebrovascular disease type [facility])   [place] [facility] Smitty Cords, DO   6 months ago Chronic pain syndrome   [place] [facility] Smitty Cords, DO   7 months ago Acute cystitis without hematuria   [place] [facility] [place], Netta Neat, DO   10 months ago Hemiparesis of right dominant side as late effect of cerebrovascular disease, unspecified cerebrovascular disease type [facility])   [place] [facility], Netta Neat, DO       Future Appointments             In 1 week Fransico Michael, Cadence H, PA-C [facility] [place]   In 1 week Alfredo Martinez, MD [facility]

## 2022-11-27 ENCOUNTER — Other Ambulatory Visit: Payer: 59

## 2022-11-28 DIAGNOSIS — Z79899 Other long term (current) drug therapy: Secondary | ICD-10-CM | POA: Diagnosis not present

## 2022-12-04 ENCOUNTER — Other Ambulatory Visit: Payer: 59

## 2022-12-04 ENCOUNTER — Ambulatory Visit: Payer: 59 | Admitting: Medical

## 2022-12-08 ENCOUNTER — Ambulatory Visit: Payer: 59 | Admitting: Urology

## 2022-12-08 DIAGNOSIS — G8929 Other chronic pain: Secondary | ICD-10-CM | POA: Diagnosis not present

## 2022-12-08 DIAGNOSIS — M19011 Primary osteoarthritis, right shoulder: Secondary | ICD-10-CM | POA: Diagnosis not present

## 2022-12-08 DIAGNOSIS — M25511 Pain in right shoulder: Secondary | ICD-10-CM | POA: Diagnosis not present

## 2022-12-10 ENCOUNTER — Ambulatory Visit: Payer: 59 | Attending: Cardiology

## 2022-12-14 ENCOUNTER — Other Ambulatory Visit: Payer: Self-pay | Admitting: Family Medicine

## 2022-12-14 DIAGNOSIS — F331 Major depressive disorder, recurrent, moderate: Secondary | ICD-10-CM

## 2022-12-15 ENCOUNTER — Ambulatory Visit: Payer: 59 | Admitting: Medical

## 2022-12-15 NOTE — Telephone Encounter (Signed)
Eye doctor added to care teams

## 2022-12-17 NOTE — Telephone Encounter (Signed)
Requested medication (s) are due for refill today - yes  Requested medication (s) are on the active medication list -yes  Future visit scheduled -no  Last refill: bupropion 09/22/22 #90- fails lab protocol- over 1 year 12/09/21                  Trelegy Ellpta 07/26/22- listed as historical provider  Notes to clinic: unable to refill per protocol- see above  Requested Prescriptions  Pending Prescriptions Disp Refills   buPROPion (WELLBUTRIN XL) 150 MG 24 hr tablet [Pharmacy Med Name: buPROPion HCl ER (XL) 150 MG Oral Tablet Extended Release 24 Hour] 90 tablet 3    Sig: TAKE 1 TABLET BY MOUTH DAILY AT  9AM     Psychiatry: Antidepressants - bupropion Failed - 12/14/2022 10:08 PM      Failed - AST in normal range and within 360 days    AST  Date Value Ref Range Status  12/09/2021 15 15 - 41 U/L Final   SGOT(AST)  Date Value Ref Range Status  09/22/2013 17 15 - 37 Unit/L Final         Failed - ALT in normal range and within 360 days    ALT  Date Value Ref Range Status  12/09/2021 9 0 - 44 U/L Final   SGPT (ALT)  Date Value Ref Range Status  09/22/2013 16 U/L Final    Comment:    14-63 NOTE: New Reference Range 08/02/13          Passed - Cr in normal range and within 360 days    Creat  Date Value Ref Range Status  05/15/2021 0.62 0.50 - 1.03 mg/dL Final   Creatinine, Ser  Date Value Ref Range Status  08/20/2022 0.90 0.44 - 1.00 mg/dL Final         Passed - Completed PHQ-2 or PHQ-9 in the last 360 days      Passed - Last BP in normal range    BP Readings from Last 1 Encounters:  11/19/22 122/78         Passed - Valid encounter within last 6 months    Recent Outpatient Visits           4 weeks ago Seizure-like activity [facility])   [place] [facility] Smitty Cords, DO   3 months ago Hemiparesis of right dominant side as late effect of cerebrovascular disease, unspecified cerebrovascular disease type [facility])   [place] [facility] Smitty Cords, DO   7 months ago Chronic pain syndrome   [place] [facility] Smitty Cords, DO   8 months ago Acute cystitis without hematuria   [place] [facility] Smitty Cords, DO   11 months ago Hemiparesis of right dominant side as late effect of cerebrovascular disease, unspecified cerebrovascular disease type [facility])   [place] [facility] [place], Netta Neat, DO       Future Appointments             In 1 month Furth, Cadence H, PA-C [place] [facility] [place]   In 2 months Alfredo Martinez, MD [facility] [place]             TRELEGY ELLIPTA 100-62.5-25 MCG/ACT AEPB [Pharmacy Med Name: Trelegy Salomon Mast 100-62.5-25 MCG/INH Inhalation Aerosol Powder Breath Activated] 180 each 3    Sig: USE 1 INHALATION BY MOUTH ONCE  DAILY AT THE SAME TIME EACH DAY  Off-Protocol Failed - 12/14/2022 10:08 PM      Failed - Medication not assigned to a protocol, review manually.      Passed - Valid encounter within last 12 months    Recent Outpatient Visits           4 weeks ago Seizure-like activity [facility])   [place] [facility] [place], Netta Neat, DO   3 months ago Hemiparesis of right dominant side as late effect of cerebrovascular disease, unspecified cerebrovascular disease type [facility])   [place] [facility] [place], Netta Neat, DO   7 months ago Chronic pain syndrome   [place] [facility] Smitty Cords, DO   8 months ago Acute cystitis without hematuria   [place] [facility] Smitty Cords, DO   11 months ago Hemiparesis of right dominant side as late effect of cerebrovascular disease, unspecified cerebrovascular disease type (HCC)   [place] [facility] [place], Netta Neat, DO       Future Appointments             In  1 month Furth, Cadence H, PA-C [place] [facility] [place]   In 2 months MacDiarmid, Lorin Picket, MD [facility] [place]               Requested Prescriptions  Pending Prescriptions Disp Refills   buPROPion (WELLBUTRIN XL) 150 MG 24 hr tablet [Pharmacy Med Name: buPROPion HCl ER (XL) 150 MG Oral Tablet Extended Release 24 Hour] 90 tablet 3    Sig: TAKE 1 TABLET BY MOUTH DAILY AT  9AM     Psychiatry: Antidepressants - bupropion Failed - 12/14/2022 10:08 PM      Failed - AST in normal range and within 360 days    AST  Date Value Ref Range Status  12/09/2021 15 15 - 41 U/L Final   SGOT(AST)  Date Value Ref Range Status  09/22/2013 17 15 - 37 Unit/L Final         Failed - ALT in normal range and within 360 days    ALT  Date Value Ref Range Status  12/09/2021 9 0 - 44 U/L Final   SGPT (ALT)  Date Value Ref Range Status  09/22/2013 16 U/L Final    Comment:    14-63 NOTE: New Reference Range 08/02/13          Passed - Cr in normal range and within 360 days    Creat  Date Value Ref Range Status  05/15/2021 0.62 0.50 - 1.03 mg/dL Final   Creatinine, Ser  Date Value Ref Range Status  08/20/2022 0.90 0.44 - 1.00 mg/dL Final         Passed - Completed PHQ-2 or PHQ-9 in the last 360 days      Passed - Last BP in normal range    BP Readings from Last 1 Encounters:  11/19/22 122/78         Passed - Valid encounter within last 6 months    Recent Outpatient Visits           4 weeks ago Seizure-like activity [facility])   [place] [facility], Netta Neat, DO   3 months ago Hemiparesis of right dominant side as late effect of cerebrovascular disease, unspecified cerebrovascular disease type [facility])   [place] [facility] Smitty Cords, DO   7 months ago Chronic pain syndrome  [place] [facility] [place], Netta Neat, DO   8 months ago Acute cystitis without hematuria   [facility] Smitty Cords, DO   11 months ago Hemiparesis of right dominant side as late effect of cerebrovascular disease, unspecified cerebrovascular disease type [facility])   [place] [facility] Smitty Cords, DO       Future Appointments             In 1 month Furth, Cadence H, PA-C [place] [facility] [place]   In 2 months Alfredo Martinez, MD [facility] [place]             TRELEGY ELLIPTA 100-62.5-25 MCG/ACT AEPB [Pharmacy Med Name: Trelegy Salomon Mast 100-62.5-25 MCG/INH Inhalation Aerosol Powder Breath Activated] 180 each 3    Sig: USE 1 INHALATION BY MOUTH ONCE  DAILY AT THE SAME TIME EACH DAY     Off-Protocol Failed - 12/14/2022 10:08 PM      Failed - Medication not assigned to a protocol, review manually.      Passed - Valid encounter within last 12 months    Recent Outpatient Visits           4 weeks ago Seizure-like activity [facility])   [place] [facility] [place], Netta Neat, DO   3 months ago Hemiparesis of right dominant side as late effect of cerebrovascular disease, unspecified cerebrovascular disease type [facility])   [place] [facility] Smitty Cords, DO   7 months ago Chronic pain syndrome   [place] [facility] Smitty Cords, DO   8 months ago Acute cystitis without hematuria   [place] [facility] Smitty Cords, DO   11 months ago Hemiparesis of right dominant side as late effect of cerebrovascular disease, unspecified cerebrovascular disease type [facility])   [place] [facility] [place], Netta Neat, DO       Future Appointments             In 1 month Furth, Cadence H, PA-C  [facility] [place]   In 2 months Alfredo Martinez, MD [facility]

## 2022-12-19 DIAGNOSIS — E119 Type 2 diabetes mellitus without complications: Secondary | ICD-10-CM | POA: Diagnosis not present

## 2022-12-19 DIAGNOSIS — G894 Chronic pain syndrome: Secondary | ICD-10-CM | POA: Diagnosis not present

## 2022-12-19 DIAGNOSIS — I69351 Hemiplegia and hemiparesis following cerebral infarction affecting right dominant side: Secondary | ICD-10-CM | POA: Diagnosis not present

## 2022-12-19 DIAGNOSIS — J9611 Chronic respiratory failure with hypoxia: Secondary | ICD-10-CM | POA: Diagnosis not present

## 2022-12-22 DIAGNOSIS — M25561 Pain in right knee: Secondary | ICD-10-CM | POA: Diagnosis not present

## 2022-12-22 DIAGNOSIS — E7800 Pure hypercholesterolemia, unspecified: Secondary | ICD-10-CM | POA: Diagnosis not present

## 2022-12-22 DIAGNOSIS — F17200 Nicotine dependence, unspecified, uncomplicated: Secondary | ICD-10-CM | POA: Diagnosis not present

## 2022-12-22 DIAGNOSIS — Z79899 Other long term (current) drug therapy: Secondary | ICD-10-CM | POA: Diagnosis not present

## 2022-12-22 DIAGNOSIS — M25562 Pain in left knee: Secondary | ICD-10-CM | POA: Diagnosis not present

## 2022-12-22 DIAGNOSIS — M5416 Radiculopathy, lumbar region: Secondary | ICD-10-CM | POA: Diagnosis not present

## 2022-12-22 DIAGNOSIS — F17210 Nicotine dependence, cigarettes, uncomplicated: Secondary | ICD-10-CM | POA: Diagnosis not present

## 2022-12-22 DIAGNOSIS — G8929 Other chronic pain: Secondary | ICD-10-CM | POA: Diagnosis not present

## 2022-12-23 ENCOUNTER — Other Ambulatory Visit: Payer: Self-pay | Admitting: Family Medicine

## 2022-12-23 DIAGNOSIS — F331 Major depressive disorder, recurrent, moderate: Secondary | ICD-10-CM

## 2022-12-23 DIAGNOSIS — I6930 Unspecified sequelae of cerebral infarction: Secondary | ICD-10-CM | POA: Diagnosis not present

## 2022-12-23 NOTE — Telephone Encounter (Signed)
Medication Refill - Most Recent Primary Care Visit:  Provider: Smitty Cords Department: [facility] Visit Type: OFFICE VISIT Date: 11/19/2022  Medication: buPROPion (WELLBUTRIN XL) 150 MG 24 hr tablet  Has the patient contacted their pharmacy? Yes (Agent: If yes, when and what did the pharmacy advise?) Pt received text from [employer] to reach out to office for refill. Rx refill request sent by [employer] 12/14/2022. Pt almost close to running out  Is this the correct pharmacy for this prescription? Yes This is the patient's preferred pharmacy:  [facility] [place] [place] - [address] [place] [address] Phone: [phone] Fax: [phone]  Has the prescription been filled recently? No  Is the patient out of the medication? No  Has the patient been seen for an appointment in the last year OR does the patient have an upcoming appointment? Yes  Can we respond through MyChart? Yes Pt prefers call, [phone]   Agent: Please be advised that Rx refills may take up to 3 business days. We ask that you follow-up with your pharmacy.

## 2022-12-24 DIAGNOSIS — Z79899 Other long term (current) drug therapy: Secondary | ICD-10-CM | POA: Diagnosis not present

## 2022-12-25 MED ORDER — BUPROPION HCL ER (XL) 150 MG PO TB24
150.0000 mg | ORAL_TABLET | Freq: Every day | ORAL | 1 refills | Status: DC
Start: 2022-12-25 — End: 2023-06-05

## 2022-12-25 NOTE — Telephone Encounter (Signed)
Requested medication (s) are due for refill today: yes  Requested medication (s) are on the active medication list: yes    Last refill: 09/22/22  #90   0 refills  Future visit scheduled no  Notes to clinic:Failed due to labs, please review. Thank you.  Requested Prescriptions  Pending Prescriptions Disp Refills   buPROPion (WELLBUTRIN XL) 150 MG 24 hr tablet 90 tablet 0     Psychiatry: Antidepressants - bupropion Failed - 12/25/2022  7:43 AM      Failed - AST in normal range and within 360 days    AST  Date Value Ref Range Status  12/09/2021 15 15 - 41 U/L Final   SGOT(AST)  Date Value Ref Range Status  09/22/2013 17 15 - 37 Unit/L Final         Failed - ALT in normal range and within 360 days    ALT  Date Value Ref Range Status  12/09/2021 9 0 - 44 U/L Final   SGPT (ALT)  Date Value Ref Range Status  09/22/2013 16 U/L Final    Comment:    14-63 NOTE: New Reference Range 08/02/13          Passed - Cr in normal range and within 360 days    Creat  Date Value Ref Range Status  05/15/2021 0.62 0.50 - 1.03 mg/dL Final   Creatinine, Ser  Date Value Ref Range Status  08/20/2022 0.90 0.44 - 1.00 mg/dL Final         Passed - Completed PHQ-2 or PHQ-9 in the last 360 days      Passed - Last BP in normal range    BP Readings from Last 1 Encounters:  11/19/22 122/78         Passed - Valid encounter within last 6 months    Recent Outpatient Visits           1 month ago Seizure-like activity [facility])   [place] [facility] Smitty Cords, DO   3 months ago Hemiparesis of right dominant side as late effect of cerebrovascular disease, unspecified cerebrovascular disease type [facility])   [place] [facility] [place], Netta Neat, DO   7 months ago Chronic pain syndrome   [place] [facility] [place], Netta Neat, DO   8 months ago Acute cystitis without hematuria   [place] [facility] Smitty Cords, DO   11 months ago Hemiparesis of right dominant side as late effect of cerebrovascular disease, unspecified cerebrovascular disease type [facility])   [place] [facility], Netta Neat, DO       Future Appointments             In 3 weeks Fransico Michael, Cadence H, PA-C [facility] [place]   In 1 month MacDiarmid, Lorin Picket, MD [facility]

## 2022-12-28 ENCOUNTER — Other Ambulatory Visit: Payer: Self-pay | Admitting: Family Medicine

## 2022-12-28 DIAGNOSIS — J432 Centrilobular emphysema: Secondary | ICD-10-CM

## 2022-12-30 NOTE — Telephone Encounter (Signed)
Requested Prescriptions  Pending Prescriptions Disp Refills   albuterol (VENTOLIN HFA) 108 (90 Base) MCG/ACT inhaler [Pharmacy Med Name: ALBUTEROL HFA 90MCG/ACT (PA)] 51 g 2    Sig: INHALE 2 INHALATIONS BY MOUTH  EVERY 4 HOURS AS NEEDED FOR  WHEEZING OR FOR SHORTNESS OF  BREATH     Pulmonology:  Beta Agonists 2 Failed - 12/30/2022  8:35 AM      Failed - Last Heart Rate in normal range    Pulse Readings from Last 1 Encounters:  11/19/22 (!) 115         Passed - Last BP in normal range    BP Readings from Last 1 Encounters:  11/19/22 122/78         Passed - Valid encounter within last 12 months    Recent Outpatient Visits           1 month ago Seizure-like activity [facility])   [place] [facility], Netta Neat, DO   3 months ago Hemiparesis of right dominant side as late effect of cerebrovascular disease, unspecified cerebrovascular disease type [facility])   [place] [facility] Smitty Cords, DO   7 months ago Chronic pain syndrome   [place] [facility] [place], Netta Neat, DO   8 months ago Acute cystitis without hematuria   [place] [facility] Smitty Cords, DO   11 months ago Hemiparesis of right dominant side as late effect of cerebrovascular disease, unspecified cerebrovascular disease type [facility])   [place] [facility] [place], Netta Neat, DO       Future Appointments             In 3 weeks Fransico Michael, Cadence H, PA-C [facility] [place]   In 1 month MacDiarmid, Lorin Picket, MD [facility]

## 2023-01-02 ENCOUNTER — Other Ambulatory Visit: Payer: 59

## 2023-01-06 ENCOUNTER — Other Ambulatory Visit: Payer: Self-pay | Admitting: Family Medicine

## 2023-01-06 DIAGNOSIS — E782 Mixed hyperlipidemia: Secondary | ICD-10-CM

## 2023-01-08 NOTE — Telephone Encounter (Signed)
Requested medication (s) are due for refill today:   Yes  Requested medication (s) are on the active medication list:   Yes  Future visit scheduled:   No  Last ordered: 05/16/2022 #90, 0 refills  Unable to refill because Lipid panel is due per protocol   Requested Prescriptions  Pending Prescriptions Disp Refills   atorvastatin (LIPITOR) 40 MG tablet [Pharmacy Med Name: Atorvastatin Calcium 40 MG Oral Tablet] 80 tablet 3    Sig: TAKE 1 TABLET BY MOUTH AT  BEDTIME     Cardiovascular:  Antilipid - Statins Failed - 01/08/2023  1:10 PM      Failed - Lipid Panel in normal range within the last 12 months    Cholesterol  Date Value Ref Range Status  04/07/2017 136 0 - 200 mg/dL Final  29/56/2130 865 (H) 0 - 200 mg/dL Final   Ldl Cholesterol, Calc  Date Value Ref Range Status  10/07/2012 127 (H) 0 - 100 mg/dL Final   LDL Cholesterol  Date Value Ref Range Status  04/07/2017 67 0 - 99 mg/dL Final    Comment:           Total Cholesterol/HDL:CHD Risk Coronary Heart Disease Risk Table                     Men   Women  1/2 Average Risk   3.4   3.3  Average Risk       5.0   4.4  2 X Average Risk   9.6   7.1  3 X Average Risk  23.4   11.0        Use the calculated Patient Ratio above and the CHD Risk Table to determine the patient's CHD Risk.        ATP III CLASSIFICATION (LDL):  <100     mg/dL   Optimal  784-696  mg/dL   Near or Above                    Optimal  130-159  mg/dL   Borderline  295-284  mg/dL   High  >132     mg/dL   Very High Performed at [facility], [address], [place], [place] [address]    HDL Cholesterol  Date Value Ref Range Status  10/07/2012 25 (L) 40 - 60 mg/dL Final   HDL  Date Value Ref Range Status  04/07/2017 31 (L) >40 mg/dL Final   Triglycerides  Date Value Ref Range Status  04/07/2017 191 (H) <150 mg/dL Final  27/25/3664 403 (H) 0 - 200 mg/dL Final         Passed - Patient is not pregnant      Passed - Valid encounter within  last 12 months    Recent Outpatient Visits           1 month ago Seizure-like activity [facility])   [place] [facility] [place], Netta Neat, DO   3 months ago Hemiparesis of right dominant side as late effect of cerebrovascular disease, unspecified cerebrovascular disease type [facility])   [place] [facility] Smitty Cords, DO   8 months ago Chronic pain syndrome   [place] Smitty Cords, DO   8 months ago Acute cystitis without hematuria   [place] [facility] Smitty Cords, DO   12 months ago Hemiparesis of right dominant side as late effect of cerebrovascular  disease, unspecified cerebrovascular disease type [facility])   [place] [facility] [place], Netta Neat, DO       Future Appointments             In 1 week Fransico Michael, Cadence H, PA-C [facility] [place]   In 1 month MacDiarmid, Lorin Picket, MD [facility]

## 2023-01-20 ENCOUNTER — Ambulatory Visit: Payer: 59 | Admitting: Medical

## 2023-01-23 ENCOUNTER — Ambulatory Visit: Payer: 59 | Attending: Cardiology

## 2023-01-23 DIAGNOSIS — F17200 Nicotine dependence, unspecified, uncomplicated: Secondary | ICD-10-CM | POA: Diagnosis not present

## 2023-01-23 DIAGNOSIS — M25562 Pain in left knee: Secondary | ICD-10-CM | POA: Diagnosis not present

## 2023-01-23 DIAGNOSIS — M25561 Pain in right knee: Secondary | ICD-10-CM | POA: Diagnosis not present

## 2023-01-23 DIAGNOSIS — E7800 Pure hypercholesterolemia, unspecified: Secondary | ICD-10-CM | POA: Diagnosis not present

## 2023-01-23 DIAGNOSIS — M5416 Radiculopathy, lumbar region: Secondary | ICD-10-CM | POA: Diagnosis not present

## 2023-01-23 DIAGNOSIS — Z79899 Other long term (current) drug therapy: Secondary | ICD-10-CM | POA: Diagnosis not present

## 2023-01-23 DIAGNOSIS — G8929 Other chronic pain: Secondary | ICD-10-CM | POA: Diagnosis not present

## 2023-01-28 DIAGNOSIS — Z79899 Other long term (current) drug therapy: Secondary | ICD-10-CM | POA: Diagnosis not present

## 2023-01-30 ENCOUNTER — Ambulatory Visit: Payer: Self-pay

## 2023-01-30 NOTE — Telephone Encounter (Signed)
  Chief Complaint: UTI Symptoms: urinary pressure, pain, and frequency Frequency: 2 days  Pertinent Negatives: NA Disposition: [] ED /[] Urgent Care (no appt availability in office) / [] Appointment(In office/virtual)/ [x]  [facility]/ [] Home Care/ [] Refused Recommended Disposition /[] [facility]/ []  Follow-up with PCP Additional Notes: pt states she has UTI again and last time was put in hospital for sx. Pt has been increasing fluid intake with cranberry juice. Not helping with sx. Scheduled VUC for tomorrow at 1500. Care advice given and pt verbalized understanding.   Summary: possible uti   Pt called saying she thinks she has a UTI.  She said when she uses the bathroom it hurts.  Please advise [phone]     Reason for Disposition  Urinating more frequently than usual (i.e., frequency)  Answer Assessment - Initial Assessment Questions 1. SYMPTOM: "What's the main symptom you're concerned about?" (e.g., frequency, incontinence)     Pressure  2. ONSET: "When did the  sx  start?"     2 days  3. PAIN: "Is there any pain?" If Yes, ask: "How bad is it?" (Scale: 1-10; mild, moderate, severe)     Yes  4. CAUSE: "What do you think is causing the symptoms?"     Possible UTI  5. OTHER SYMPTOMS: "Do you have any other symptoms?" (e.g., blood in urine, fever, flank pain, pain with urination)     Pain and frequency  Protocols used: Urinary Symptoms-A-AH

## 2023-01-31 ENCOUNTER — Telehealth: Payer: 59 | Admitting: Physician Assistant

## 2023-01-31 DIAGNOSIS — R3989 Other symptoms and signs involving the genitourinary system: Secondary | ICD-10-CM | POA: Diagnosis not present

## 2023-01-31 MED ORDER — CEPHALEXIN 500 MG PO CAPS: 500.0000 mg | ORAL_CAPSULE | Freq: Two times a day (BID) | ORAL | 0 refills | Status: AC

## 2023-01-31 NOTE — Patient Instructions (Signed)
[name], thank you for joining Laure Kidney, PA-C for today's virtual visit.  While this provider is not your primary care provider (PCP), if your PCP is located in our provider database this encounter information will be shared with them immediately following your visit.   A [facility] MyChart account gives you access to today's visit and all your visits, tests, and labs performed at [facility] " click here if you don't have a  MyChart account or go to [email or web address]  Consent: (Patient) [name] provided verbal consent for this virtual visit at the beginning of the encounter.  Current Medications:  Current Outpatient Medications:    albuterol (VENTOLIN HFA) 108 (90 Base) MCG/ACT inhaler, INHALE 2 INHALATIONS BY MOUTH  EVERY 4 HOURS AS NEEDED FOR  WHEEZING OR FOR SHORTNESS OF  BREATH, Disp: 51 g, Rfl: 2   atorvastatin (LIPITOR) 40 MG tablet, TAKE 1 TABLET BY MOUTH AT  BEDTIME, Disp: 90 tablet, Rfl: 3   azelastine (ASTELIN) 0.1 % nasal spray, Place 2 sprays into both nostrils 2 (two) times daily., Disp: 30 mL, Rfl: 12   buPROPion (WELLBUTRIN XL) 150 MG 24 hr tablet, Take 1 tablet (150 mg total) by mouth daily., Disp: 100 tablet, Rfl: 1   citalopram (CELEXA) 40 MG tablet, TAKE 1 TABLET BY MOUTH DAILY, Disp: 100 tablet, Rfl: 1   clopidogrel (PLAVIX) 75 MG tablet, TAKE ONE TABLET BY MOUTH DAILY AT 9AM, Disp: 100 tablet, Rfl: 11   cyclobenzaprine (FLEXERIL) 10 MG tablet, Take 10 mg by mouth 3 (three) times daily., Disp: , Rfl:    Fluticasone-Umeclidin-Vilant (TRELEGY ELLIPTA) 100-62.5-25 MCG/ACT AEPB, USE 1 INHALATION BY MOUTH ONCE  DAILY AT THE SAME TIME EACH DAY, Disp: 180 each, Rfl: 0   gabapentin (NEURONTIN) 600 MG tablet, TAKE 1 TABLET BY MOUTH 4 TIMES  DAILY, Disp: 400 tablet, Rfl: 2   metFORMIN (GLUCOPHAGE) 1000 MG tablet, TAKE 1 TABLET BY MOUTH TWICE  DAILY WITH MEALS, Disp: 200 tablet, Rfl: 2   midodrine (PROAMATINE) 5 MG tablet, Take 1  tablet (5 mg total) by mouth 3 (three) times daily with meals., Disp: 21 tablet, Rfl: 0   MOUNJARO 10 MG/0.5ML Pen, INJECT SUBCUTANEOUSLY 10 MG ONCE WEEKLY, Disp: 6 mL, Rfl: 3   nitrofurantoin (MACRODANTIN) 100 MG capsule, Take 1 capsule (100 mg total) by mouth daily., Disp: 30 capsule, Rfl: 11   nystatin (MYCOSTATIN/NYSTOP) powder, APPLY 1 APPLICATION TOPICALLY THREE TIMES DAILY AS NEEDED FOR YEAST RASH INTERTRIGO, Disp: 30 g, Rfl: 11   oxyCODONE-acetaminophen (PERCOCET) 10-325 MG tablet, Take 1 tablet by mouth every 8 (eight) hours as needed for pain., Disp: 90 tablet, Rfl: 0   pantoprazole (PROTONIX) 40 MG tablet, TAKE 1 TABLET BY MOUTH DAILY AT  9 AM, Disp: 100 tablet, Rfl: 0   QUEtiapine (SEROQUEL) 100 MG tablet, TAKE 1 TABLET BY MOUTH DAILY AT  9 PM AT BEDTIME, Disp: 100 tablet, Rfl: 2   topiramate (TOPAMAX) 100 MG tablet, TAKE 1 TABLET BY MOUTH TWICE  DAILY, Disp: 200 tablet, Rfl: 2   VITAMIN D PO, Take by mouth., Disp: , Rfl:    Vitamin D, Ergocalciferol, (DRISDOL) 1.25 MG (50000 UNIT) CAPS capsule, TAKE 1 CAPSULE BY MOUTH EVERY WEDNESDAY @9AM , Disp: 4 capsule, Rfl: 11   Medications ordered in this encounter:  No orders of the defined types were placed in this encounter.    *If you need refills on other medications prior to your next appointment, please contact your pharmacy*  Follow-Up: Call  back or seek an in-person evaluation if the symptoms worsen or if the condition fails to improve as anticipated.  [facility] [phone]  Other Instructions Follow up with primary provider in 2 days.    If you have been instructed to have an in-person evaluation today at a local Urgent Care facility, please use the link below. It will take you to a list of all of our available [facility], including address, phone number and hours of operation. Please do not delay care.  [facility]  If you or a family member do not have a primary care provider, use  the link below to schedule a visit and establish care. When you choose a [facility] primary care physician or advanced practice provider, you gain a long-term partner in health. Find a Primary Care Provider  Learn more about [facility]'s in-office and virtual care options: [facility] - Get Care Now

## 2023-01-31 NOTE — Progress Notes (Signed)
Virtual Visit Consent   [name], you are scheduled for a virtual visit with a [facility] provider today. Just as with appointments in the office, your consent must be obtained to participate. Your consent will be active for this visit and any virtual visit you may have with one of our providers in the next 365 days. If you have a MyChart account, a copy of this consent can be sent to you electronically.  As this is a virtual visit, video technology does not allow for your provider to perform a traditional examination. This may limit your provider's ability to fully assess your condition. If your provider identifies any concerns that need to be evaluated in person or the need to arrange testing (such as labs, EKG, etc.), we will make arrangements to do so. Although advances in technology are sophisticated, we cannot ensure that it will always work on either your end or our end. If the connection with a video visit is poor, the visit may have to be switched to a telephone visit. With either a video or telephone visit, we are not always able to ensure that we have a secure connection.  By engaging in this virtual visit, you consent to the provision of healthcare and authorize for your insurance to be billed (if applicable) for the services provided during this visit. Depending on your insurance coverage, you may receive a charge related to this service.  I need to obtain your verbal consent now. Are you willing to proceed with your visit today? [name] has provided verbal consent on 01/31/2023 for a virtual visit (video or telephone). Laure Kidney, [place]  Date: 01/31/2023 2:53 PM  Virtual Visit via Video Note   I, Laure Kidney, connected with  [name]  ([id number], 1965/09/09) on 01/31/23 at  3:00 PM EST by a video-enabled telemedicine application and verified that I am speaking with the correct person using two identifiers.  Location: Patient: Virtual Visit Location Patient:  Home Provider: Virtual Visit Location Provider: Home Office   I discussed the limitations of evaluation and management by telemedicine and the availability of in person appointments. The patient expressed understanding and agreed to proceed.    History of Present Illness: [name] is a 58 y.o. who identifies as a female who was assigned female at birth, and is being seen today for UTI.  HPI: Urinary Tract Infection  This is a new problem. The current episode started yesterday. The problem occurs every urination. Quality: pressure. The pain is mild. There has been no fever. She is Not sexually active. There is No history of pyelonephritis. Associated symptoms include frequency. Pertinent negatives include no chills, discharge, flank pain, hematuria, hesitancy, sweats, urgency or vomiting. She has tried nothing for the symptoms. Her past medical history is significant for recurrent UTIs.    Problems:  Patient Active Problem List   Diagnosis Date Noted   Chronic right hip pain 05/06/2022   Chronic low back pain without sciatica 01/14/2022   Generalized weakness 12/18/2021   Hypokalemia 12/10/2021   High anion gap metabolic acidosis 12/09/2021   Obesity, Class III, BMI 40-49.9 (morbid obesity) (HCC) 12/09/2021   Hemiparesis of right dominant side as late effect of cerebrovascular disease (HCC) 09/21/2020   Vitamin D deficiency 08/22/2020   Urinary incontinence 07/27/2020   Centrilobular emphysema (HCC) 07/10/2020   Moderate episode of recurrent major depressive disorder (HCC) 07/10/2020   Chronic pain syndrome 07/10/2020   Intractable chronic migraine without aura and with  status migrainosus 07/10/2020   Chronic hypoxemic respiratory failure (HCC) 09/23/2017   Pulmonary nodules 09/23/2017   Trimalleolar fracture 08/09/2017   TIA (transient ischemic attack) 04/07/2017   Anxiety and depression 04/07/2017   History of cerebrovascular accident (CVA) with residual deficit    Complicated  migraine    Brain TIA 10/11/2012   Hyperlipidemia 10/11/2012   Nocturnal hypoxemia 10/11/2012   Type 2 diabetes mellitus with other specified complication (HCC) 10/11/2012   Tobacco abuse 10/11/2012   COPD (chronic obstructive pulmonary disease) (HCC) 10/11/2012    Allergies:  Allergies  Allergen Reactions   Tramadol Nausea And Vomiting and Hypertension   Augmentin [Amoxicillin-Pot Clavulanate] Rash   Medications:  Current Outpatient Medications:    albuterol (VENTOLIN HFA) 108 (90 Base) MCG/ACT inhaler, INHALE 2 INHALATIONS BY MOUTH  EVERY 4 HOURS AS NEEDED FOR  WHEEZING OR FOR SHORTNESS OF  BREATH, Disp: 51 g, Rfl: 2   atorvastatin (LIPITOR) 40 MG tablet, TAKE 1 TABLET BY MOUTH AT  BEDTIME, Disp: 90 tablet, Rfl: 3   azelastine (ASTELIN) 0.1 % nasal spray, Place 2 sprays into both nostrils 2 (two) times daily., Disp: 30 mL, Rfl: 12   buPROPion (WELLBUTRIN XL) 150 MG 24 hr tablet, Take 1 tablet (150 mg total) by mouth daily., Disp: 100 tablet, Rfl: 1   citalopram (CELEXA) 40 MG tablet, TAKE 1 TABLET BY MOUTH DAILY, Disp: 100 tablet, Rfl: 1   clopidogrel (PLAVIX) 75 MG tablet, TAKE ONE TABLET BY MOUTH DAILY AT 9AM, Disp: 100 tablet, Rfl: 11   cyclobenzaprine (FLEXERIL) 10 MG tablet, Take 10 mg by mouth 3 (three) times daily., Disp: , Rfl:    Fluticasone-Umeclidin-Vilant (TRELEGY ELLIPTA) 100-62.5-25 MCG/ACT AEPB, USE 1 INHALATION BY MOUTH ONCE  DAILY AT THE SAME TIME EACH DAY, Disp: 180 each, Rfl: 0   gabapentin (NEURONTIN) 600 MG tablet, TAKE 1 TABLET BY MOUTH 4 TIMES  DAILY, Disp: 400 tablet, Rfl: 2   metFORMIN (GLUCOPHAGE) 1000 MG tablet, TAKE 1 TABLET BY MOUTH TWICE  DAILY WITH MEALS, Disp: 200 tablet, Rfl: 2   midodrine (PROAMATINE) 5 MG tablet, Take 1 tablet (5 mg total) by mouth 3 (three) times daily with meals., Disp: 21 tablet, Rfl: 0   MOUNJARO 10 MG/0.5ML Pen, INJECT SUBCUTANEOUSLY 10 MG ONCE WEEKLY, Disp: 6 mL, Rfl: 3   nitrofurantoin (MACRODANTIN) 100 MG capsule, Take 1  capsule (100 mg total) by mouth daily., Disp: 30 capsule, Rfl: 11   nystatin (MYCOSTATIN/NYSTOP) powder, APPLY 1 APPLICATION TOPICALLY THREE TIMES DAILY AS NEEDED FOR YEAST RASH INTERTRIGO, Disp: 30 g, Rfl: 11   oxyCODONE-acetaminophen (PERCOCET) 10-325 MG tablet, Take 1 tablet by mouth every 8 (eight) hours as needed for pain., Disp: 90 tablet, Rfl: 0   pantoprazole (PROTONIX) 40 MG tablet, TAKE 1 TABLET BY MOUTH DAILY AT  9 AM, Disp: 100 tablet, Rfl: 0   QUEtiapine (SEROQUEL) 100 MG tablet, TAKE 1 TABLET BY MOUTH DAILY AT  9 PM AT BEDTIME, Disp: 100 tablet, Rfl: 2   topiramate (TOPAMAX) 100 MG tablet, TAKE 1 TABLET BY MOUTH TWICE  DAILY, Disp: 200 tablet, Rfl: 2   VITAMIN D PO, Take by mouth., Disp: , Rfl:    Vitamin D, Ergocalciferol, (DRISDOL) 1.25 MG (50000 UNIT) CAPS capsule, TAKE 1 CAPSULE BY MOUTH EVERY WEDNESDAY @9AM , Disp: 4 capsule, Rfl: 11  Observations/Objective: Patient is well-developed, well-nourished in no acute distress.  Resting comfortably  at home.  Head is normocephalic, atraumatic.  No labored breathing.  Speech is clear and coherent with  logical content.  Patient is alert and oriented at baseline.    Assessment and Plan: 1. Suspected UTI (Primary)  Patient presents with symptoms consistent with suspected UTI No systemic symptoms. Not septic. Well appearing. Low suspicion for acute pyelonephritis given lack of fever, CVAT, or systemic features. Will treat with keflex and I have advised patient to follow up with PCP and if her symptoms are not improving within 2-3 days she is to be seen in person for evaluation. She is in agreement with this plan and all questions answered during visit on today.   Follow Up Instructions: I discussed the assessment and treatment plan with the patient. The patient was provided an opportunity to ask questions and all were answered. The patient agreed with the plan and demonstrated an understanding of the instructions.  A copy of  instructions were sent to the patient via MyChart unless otherwise noted below.     The patient was advised to call back or seek an in-person evaluation if the symptoms worsen or if the condition fails to improve as anticipated.    Laure Kidney, PA-C

## 2023-02-02 ENCOUNTER — Other Ambulatory Visit: Payer: Self-pay | Admitting: Family Medicine

## 2023-02-02 DIAGNOSIS — K219 Gastro-esophageal reflux disease without esophagitis: Secondary | ICD-10-CM

## 2023-02-02 NOTE — Telephone Encounter (Signed)
Left message for patient to return call.

## 2023-02-03 NOTE — Telephone Encounter (Signed)
Requested Prescriptions  Pending Prescriptions Disp Refills   pantoprazole (PROTONIX) 40 MG tablet [Pharmacy Med Name: Pantoprazole Sodium 40 MG Oral Tablet Delayed Release] 100 tablet 0    Sig: TAKE 1 TABLET BY MOUTH DAILY AT  9 AM     Gastroenterology: Proton Pump Inhibitors Passed - 02/03/2023  2:15 PM      Passed - Valid encounter within last 12 months    Recent Outpatient Visits           2 months ago Seizure-like activity [facility])   [place] [facility] [place], Netta Neat, DO   4 months ago Hemiparesis of right dominant side as late effect of cerebrovascular disease, unspecified cerebrovascular disease type [facility])   [place] [facility] Smitty Cords, DO   9 months ago Chronic pain syndrome   [place] [facility] Smitty Cords, DO   9 months ago Acute cystitis without hematuria   [facility] Smitty Cords, DO   1 year ago Hemiparesis of right dominant side as late effect of cerebrovascular disease, unspecified cerebrovascular disease type [facility])   [place] [facility] [place], Netta Neat, DO       Future Appointments             In 6 days Furth, Cadence H, PA-C [facility] [place]   In 1 week Alfredo Martinez, MD [facility]

## 2023-02-04 ENCOUNTER — Inpatient Hospital Stay: Admission: RE | Admit: 2023-02-04 | Payer: 59 | Source: Ambulatory Visit

## 2023-02-05 ENCOUNTER — Telehealth (INDEPENDENT_AMBULATORY_CARE_PROVIDER_SITE_OTHER): Payer: 59 | Admitting: Family Medicine

## 2023-02-05 ENCOUNTER — Encounter: Payer: Self-pay | Admitting: Family Medicine

## 2023-02-05 DIAGNOSIS — K3184 Gastroparesis: Secondary | ICD-10-CM | POA: Diagnosis not present

## 2023-02-05 DIAGNOSIS — Z7984 Long term (current) use of oral hypoglycemic drugs: Secondary | ICD-10-CM | POA: Diagnosis not present

## 2023-02-05 DIAGNOSIS — E1143 Type 2 diabetes mellitus with diabetic autonomic (poly)neuropathy: Secondary | ICD-10-CM

## 2023-02-05 DIAGNOSIS — R110 Nausea: Secondary | ICD-10-CM | POA: Diagnosis not present

## 2023-02-05 DIAGNOSIS — Z7985 Long-term (current) use of injectable non-insulin antidiabetic drugs: Secondary | ICD-10-CM

## 2023-02-05 DIAGNOSIS — E1169 Type 2 diabetes mellitus with other specified complication: Secondary | ICD-10-CM | POA: Diagnosis not present

## 2023-02-05 MED ORDER — ONDANSETRON 4 MG PO TBDP: 4.0000 mg | ORAL_TABLET | Freq: Three times a day (TID) | ORAL | 2 refills | Status: DC | PRN

## 2023-02-05 NOTE — Patient Instructions (Signed)
Thank you for coming to the office today.  STOP Metformin for 1-2 weeks  Then if not resolved, go ahead and space out the [facility] 10mg  to every other week.  Regardless I would suggest repeat A1c and follow-up and dose lowering Mounjaro.    Please schedule a Follow-up Appointment to: No follow-ups on file.  If you have any other questions or concerns, please feel free to call the office or send a message through MyChart. You may also schedule an earlier appointment if necessary.  Additionally, you may be receiving a survey about your experience at our office within a few days to 1 week by e-mail or mail. We value your feedback.  Saralyn Pilar, DO [facility], [place]

## 2023-02-05 NOTE — Progress Notes (Signed)
Subjective:    Patient ID: [name], female    DOB: [birth year], 58 y.o.   MRN: [id number]  SHADANA MASAKI is a 58 y.o. female presenting on 02/05/2023 for Nausea (Reduced appetite) and Diabetes   Virtual / Telehealth Encounter - Video Visit via MyChart The purpose of this virtual visit is to provide medical care while limiting exposure to the novel coronavirus (COVID19) for both patient and office staff.  Consent was obtained for remote visit:  Yes.   Answered questions that patient had about telehealth interaction:  Yes.   I discussed the limitations, risks, security and privacy concerns of performing an evaluation and management service by video/telephone. I also discussed with the patient that there may be a patient responsible charge related to this service. The patient expressed understanding and agreed to proceed.  Patient Location: Home Provider Location: [facility] (Office)  Participants in virtual visit: - Patient: [name] - CMA: Fuller Plan CMA - Provider: Dr Althea Charon   HPI  Discussed the use of AI scribe software for clinical note transcription with the patient, who gave verbal consent to proceed.  History of Present Illness    Diabetes, Type 2 Nausea Reduced Appetite  The patient, with a history of diabetes, has been experiencing reduced appetite and nausea for the past two to three months. She reports a lack of hunger until late in the afternoon and is unable to finish meals due to early satiety. The patient also experiences nausea post meals, but denies any episodes of vomiting. She has been on [facility] for a significant period of time >1+ years and on current dose >9 months, and these symptoms notably worse within past few months, however chart documentation shows similar symptoms >1 year ago  Dose inc Mounjaro to 10mg  was in 04/2022  The patient also reports good control of her blood sugar levels, with the most recent reading  being 112. She also reports taking metformin in addition to the [facility]. The patient has six doses of Mounjaro left and is in need of more nausea medication, having previously found Zofran effective.      Last A1c 5.2 (04/2022) Home CBG 100-110s        05/16/2022    3:34 PM 05/06/2022    2:43 PM 05/15/2021    1:59 PM  Depression screen PHQ 2/9  Decreased Interest 1 2 2   Down, Depressed, Hopeless 1 0 0  PHQ - 2 Score 2 2 2   Altered sleeping 1 3 3   Tired, decreased energy 1 2 3   Change in appetite 0 2 3  Feeling bad or failure about yourself  0 0 0  Trouble concentrating 0 2 2  Moving slowly or fidgety/restless 0 1 2  Suicidal thoughts 0 0 0  PHQ-9 Score 4 12 15   Difficult doing work/chores Somewhat difficult Somewhat difficult Very difficult       05/06/2022    2:44 PM 05/15/2021    1:59 PM 04/17/2021    2:35 PM 01/18/2021    1:11 PM  GAD 7 : Generalized Anxiety Score  Nervous, Anxious, on Edge 1 2 2  0  Control/stop worrying 1 0 0 0  Worry too much - different things 1 2 1  0  Trouble relaxing 1 3 3 1   Restless 1 3 3    Easily annoyed or irritable 2 3 3 1   Afraid - awful might happen 0 0 0 0  Total GAD 7 Score 7 13 12  Anxiety Difficulty Not difficult at all Somewhat difficult Not difficult at all Not difficult at all    Social History   Tobacco Use   Smoking status: Every Day    Current packs/day: 1.00    Average packs/day: 1 pack/day for 20.0 years (20.0 ttl pk-yrs)    Types: Cigarettes   Smokeless tobacco: Never  Vaping Use   Vaping status: Never Used  Substance Use Topics   Alcohol use: No   Drug use: Yes    Types: Marijuana, Oxycodone    Comment: last smoked 2 days ago  8/4    Review of Systems Per HPI unless specifically indicated above     Objective:    There were no vitals taken for this visit.  Wt Readings from Last 3 Encounters:  11/19/22 230 lb (104.3 kg)  09/16/22 244 lb (110.7 kg)  08/27/22 244 lb (110.7 kg)     Physical Exam  Note  examination was completely remotely via video observation objective data only  Gen - well-appearing, no acute distress or apparent pain, comfortable HEENT - eyes appear clear without discharge or redness Heart/Lungs - cannot examine virtually - observed no evidence of coughing or labored breathing. Abd - cannot examine virtually  Skin - face visible today- no rash Neuro - awake, alert, oriented Psych - not anxious appearing   Results for orders placed or performed in visit on 11/04/22  Microalbumin, urine   Collection Time: 11/04/22  2:28 PM  Result Value Ref Range   Microalb, Ur 0.2 mg/dL   RAM        Assessment & Plan:   Problem List Items Addressed This Visit     Type 2 diabetes mellitus with other specified complication (HCC) - Primary   Other Visit Diagnoses       Gastroparesis due to DM (HCC)         Nausea       Relevant Medications   ondansetron (ZOFRAN-ODT) 4 MG disintegrating tablet        Reduced Appetite and Nausea Likely side effect of Mounjaro and Metformin. No vomiting. Symptoms have been present for 2-3 months by her report, but chart review shows reported similar symptoms >1 year +  -Discontinue Metformin for 1-2 weeks and monitor for improvement. -If no improvement after 1-2 weeks, consider spacing Mounjaro to every other week initially for several doses to see if improved, next move is reduce dosage on new rx to [facility] 5 or 7.5mg  ultimately she may need to come off GLP/GIP if it is provoking these symptoms -Order Zofran for nausea and send to [employer].  Diabetes Mellitus Type 2 Well controlled with recent blood glucose reading of 112. Currently on Mounjaro and Metformin as stated above Advise that we will need to follow-up A1c upcoming after we reduce meds to see if maintain DM Control  Follow-up in 3 months or sooner if there are questions or concerns.         No orders of the defined types were placed in this encounter.   Meds  ordered this encounter  Medications   ondansetron (ZOFRAN-ODT) 4 MG disintegrating tablet    Sig: Take 1 tablet (4 mg total) by mouth every 8 (eight) hours as needed for nausea or vomiting.    Dispense:  30 tablet    Refill:  2    Follow up plan: Return if symptoms worsen or fail to improve.   Patient verbalizes understanding with the above medical recommendations including the limitation of  remote medical advice.  Specific follow-up and call-back criteria were given for patient to follow-up or seek medical care more urgently if needed.  Total duration of direct patient care provided via video conference: 7 minutes   Saralyn Pilar, DO [facility] 02/05/2023, 10:14 AM

## 2023-02-09 ENCOUNTER — Ambulatory Visit: Payer: 59 | Admitting: Medical

## 2023-02-09 NOTE — Progress Notes (Deleted)
Cardiology Office Note:    Date:  02/09/2023   ID:  [name], DOB 02/15/1965, MRN [id number]  PCP:  Smitty Cords, DO  [facility] Cardiologist:  Debbe Odea, MD  [facility] Electrophysiologist:  None   Referring MD: Saralyn Pilar *   Chief Complaint: ***  History of Present Illness:    [name] is a 58 y.o. female with a hx of HLD, wheelchair bound, current smoker x 45+ years, PAD(significant SFA/popliteal stenosis, mild to mod narrowing of the left CFA), stroke 2013 who presents for 3 month follow-up.   Echo 03/2017 showed LVEF 60-65%.   She was last seen 10/2022 and was hypotensive. Midodrine was started and echo was ordered.   Past Medical History:  Diagnosis Date   Anxiety    Cancer (HCC)    a spot on liver and treated    Complication of anesthesia    restless,easily upset   COPD (chronic obstructive pulmonary disease) (HCC)    Depression    Diabetes mellitus without complication (HCC)    Diverticulitis    Fatty liver    GERD (gastroesophageal reflux disease)    Headache(784.0)    migraines   History of kidney stones    Hyperlipidemia    Hypertension    PAD (peripheral artery disease) (HCC)    Pneumonia    Restless    Stroke [facility])     Past Surgical History:  Procedure Laterality Date   ABDOMINAL HYSTERECTOMY     ACHILLES TENDON LENGTHENING Right 08/18/2017   Procedure: ACHILLES TENDON LENGTHENING;  Surgeon: Myrene Galas, MD;  Location: [facility] OR;  Service: Orthopedics;  Laterality: Right;   ANTERIOR CERVICAL DECOMP/DISCECTOMY FUSION N/A 03/11/2012   Procedure: ANTERIOR CERVICAL DECOMPRESSION/DISCECTOMY FUSION 1 LEVEL;  Surgeon: Cristi Loron, MD;  Location: [facility] NEURO ORS;  Service: Neurosurgery;  Laterality: N/A;  Cervical five-six anterior cervical decompression with fusion interbody prothesis plating and bonegraft   BACK SURGERY     EXTERNAL FIXATION LEG Right 08/10/2017   Procedure: EXTERNAL FIXATION ANKLE;   Surgeon: Donato Heinz, MD;  Location: [facility] ORS;  Service: Orthopedics;  Laterality: Right;   EXTERNAL FIXATION REMOVAL Right 08/18/2017   Procedure: REMOVAL EXTERNAL FIXATION LEG;  Surgeon: Myrene Galas, MD;  Location: [facility] OR;  Service: Orthopedics;  Laterality: Right;   EYE SURGERY     HERNIA REPAIR     IR FL GUIDED LOC OF NEEDLE/CATH TIP FOR SPINAL INJECTION LT  12/09/2021   IR FL GUIDED LOC OF NEEDLE/CATH TIP FOR SPINAL INJECTION RT  12/09/2021   JOINT REPLACEMENT     bil knees   ORIF ANKLE FRACTURE Right 08/18/2017   Procedure: OPEN REDUCTION INTERNAL FIXATION (ORIF) ANKLE FRACTURE;  Surgeon: Myrene Galas, MD;  Location: [facility] OR;  Service: Orthopedics;  Laterality: Right;   REPLACEMENT TOTAL KNEE BILATERAL      Current Medications: No outpatient medications have been marked as taking for the 02/09/23 encounter (Appointment) with Fransico Michael, Tahir Blank H, PA-C.     Allergies:   Tramadol and Augmentin [amoxicillin-pot clavulanate]   Social History   Socioeconomic History   Marital status: Married    Spouse name: Not on file   Number of children: Not on file   Years of education: Not on file   Highest education level: 10th grade  Occupational History   Not on file  Tobacco Use   Smoking status: Every Day    Current packs/day: 1.00    Average packs/day: 1 pack/day for 20.0 years (  20.0 ttl pk-yrs)    Types: Cigarettes   Smokeless tobacco: Never  Vaping Use   Vaping status: Never Used  Substance and Sexual Activity   Alcohol use: No   Drug use: Yes    Types: Marijuana, Oxycodone    Comment: last smoked 2 days ago  8/4   Sexual activity: Not on file  Other Topics Concern   Not on file  Social History Narrative   Not on file   Social Drivers of Health   Financial Resource Strain: Low Risk  (11/19/2022)   Overall Financial Resource Strain (CARDIA)    Difficulty of Paying Living Expenses: Not very hard  Food Insecurity: Food Insecurity Present (11/19/2022)   Hunger Vital Sign     Worried About Running Out of Food in the Last Year: Sometimes true    Ran Out of Food in the Last Year: Patient declined  Transportation Needs: No Transportation Needs (11/19/2022)   PRAPARE - Administrator, Civil Service (Medical): No    Lack of Transportation (Non-Medical): No  Physical Activity: Inactive (11/19/2022)   Exercise Vital Sign    Days of Exercise per Week: 0 days    Minutes of Exercise per Session: 0 min  Stress: Stress Concern Present (11/19/2022)   [employer] of Occupational Health - Occupational Stress Questionnaire    Feeling of Stress : To some extent  Social Connections: Moderately Isolated (11/19/2022)   Social Connection and Isolation Panel [NHANES]    Frequency of Communication with Friends and Family: More than three times a week    Frequency of Social Gatherings with Friends and Family: Patient declined    Attends Religious Services: Never    Database administrator or Organizations: No    Attends Engineer, structural: Never    Marital Status: Married     Family History: The patient's ***family history includes Diabetes in her mother; Hypertension in her father and mother.  ROS:   Please see the history of present illness.    *** All other systems reviewed and are negative.  EKGs/Labs/Other Studies Reviewed:    The following studies were reviewed today: ***  EKG:  EKG is *** ordered today.  The ekg ordered today demonstrates ***  Recent Labs: 08/20/2022: Creatinine, Ser 0.90  Recent Lipid Panel    Component Value Date/Time   CHOL 136 04/07/2017 0828   CHOL 207 (H) 10/07/2012 0423   TRIG 191 (H) 04/07/2017 0828   TRIG 273 (H) 10/07/2012 0423   HDL 31 (L) 04/07/2017 0828   HDL 25 (L) 10/07/2012 0423   CHOLHDL 4.4 04/07/2017 0828   VLDL 38 04/07/2017 0828   VLDL 55 (H) 10/07/2012 0423   LDLCALC 67 04/07/2017 0828   LDLCALC 127 (H) 10/07/2012 0423     Risk Assessment/Calculations:   {Does this patient have ATRIAL  FIBRILLATION?:[phone]}   Physical Exam:    VS:  There were no vitals taken for this visit.    Wt Readings from Last 3 Encounters:  11/19/22 230 lb (104.3 kg)  09/16/22 244 lb (110.7 kg)  08/27/22 244 lb (110.7 kg)     GEN: *** Well nourished, well developed in no acute distress HEENT: Normal NECK: No JVD; No carotid bruits LYMPHATICS: No lymphadenopathy CARDIAC: ***RRR, no murmurs, rubs, gallops RESPIRATORY:  Clear to auscultation without rales, wheezing or rhonchi  ABDOMEN: Soft, non-tender, non-distended MUSCULOSKELETAL:  No edema; No deformity  SKIN: Warm and dry NEUROLOGIC:  Alert and oriented x 3 PSYCHIATRIC:  Normal affect   ASSESSMENT:    No diagnosis found. PLAN:    In order of problems listed above:  ***  Disposition: Follow up {follow up:15908} with ***   Shared Decision Making/Informed Consent   {Are you ordering a CV Procedure (e.g. stress test, cath, DCCV, TEE, etc)?   Press F2        :[id number]}    Signed, Wilbert Schouten David Stall, PA-C  02/09/2023 8:28 AM    [facility]

## 2023-02-15 ENCOUNTER — Other Ambulatory Visit: Payer: Self-pay | Admitting: Internal Medicine

## 2023-02-16 ENCOUNTER — Ambulatory Visit: Payer: Self-pay | Admitting: Urology

## 2023-02-16 ENCOUNTER — Ambulatory Visit: Payer: 59 | Admitting: Urology

## 2023-02-17 ENCOUNTER — Ambulatory Visit: Payer: 59 | Attending: Cardiology

## 2023-02-17 ENCOUNTER — Encounter: Payer: Self-pay | Admitting: Urology

## 2023-02-17 NOTE — Telephone Encounter (Signed)
 Requested medication (s) are due for refill today: routing for review  Requested medication (s) are on the active medication list: yes  Last refill:  12/18/22  Future visit scheduled: no  Notes to clinic:  Medication not assigned to a protocol, review manually.      Requested Prescriptions  Pending Prescriptions Disp Refills   TRELEGY ELLIPTA  100-62.5-25 MCG/ACT AEPB [Pharmacy Med Name: Trelegy Ellipta  100-62.5-25 MCG/INH Inhalation Aerosol Powder Breath Activated] 180 each 3    Sig: USE 1 INHALATION BY MOUTH ONCE  DAILY AT THE SAME TIME EACH DAY     Off-Protocol Failed - 02/17/2023  9:29 AM      Failed - Medication not assigned to a protocol, review manually.      Passed - Valid encounter within last 12 months    Recent Outpatient Visits           1 week ago Type 2 diabetes mellitus with other specified complication, without long-term current use of insulin  [facility])   [facility] [place], Marsa PARAS, DO   3 months ago Seizure-like activity [facility])   [place] [facility] [place] Marsa PARAS, DO   5 months ago Hemiparesis of right dominant side as late effect of cerebrovascular disease, unspecified cerebrovascular disease type [facility])   [place] [facility] [place] Marsa PARAS, DO   9 months ago Chronic pain syndrome   [place] [facility] [place] Marsa PARAS, DO   10 months ago Acute cystitis without hematuria   [place] [facility] [place] Marsa PARAS, DO       Future Appointments             In 1 week [place], Cadence H, PA-C [employer] [facility] [place]

## 2023-02-23 DIAGNOSIS — F17200 Nicotine dependence, unspecified, uncomplicated: Secondary | ICD-10-CM | POA: Diagnosis not present

## 2023-02-23 DIAGNOSIS — M5416 Radiculopathy, lumbar region: Secondary | ICD-10-CM | POA: Diagnosis not present

## 2023-02-23 DIAGNOSIS — M25562 Pain in left knee: Secondary | ICD-10-CM | POA: Diagnosis not present

## 2023-02-23 DIAGNOSIS — E7800 Pure hypercholesterolemia, unspecified: Secondary | ICD-10-CM | POA: Diagnosis not present

## 2023-02-23 DIAGNOSIS — Z0000 Encounter for general adult medical examination without abnormal findings: Secondary | ICD-10-CM | POA: Diagnosis not present

## 2023-02-23 DIAGNOSIS — F17210 Nicotine dependence, cigarettes, uncomplicated: Secondary | ICD-10-CM | POA: Diagnosis not present

## 2023-02-23 DIAGNOSIS — M25561 Pain in right knee: Secondary | ICD-10-CM | POA: Diagnosis not present

## 2023-02-23 DIAGNOSIS — Z79899 Other long term (current) drug therapy: Secondary | ICD-10-CM | POA: Diagnosis not present

## 2023-02-23 DIAGNOSIS — G8929 Other chronic pain: Secondary | ICD-10-CM | POA: Diagnosis not present

## 2023-02-23 DIAGNOSIS — R030 Elevated blood-pressure reading, without diagnosis of hypertension: Secondary | ICD-10-CM | POA: Diagnosis not present

## 2023-02-26 ENCOUNTER — Ambulatory Visit: Payer: Self-pay | Admitting: *Deleted

## 2023-02-26 DIAGNOSIS — Z79899 Other long term (current) drug therapy: Secondary | ICD-10-CM | POA: Diagnosis not present

## 2023-02-26 NOTE — Telephone Encounter (Signed)
Reason for Disposition . [1] SEVERE headache AND [2] fever    Appropriate for office appointment  Answer Assessment - Initial Assessment Questions 1. LOCATION: "Where does it hurt?"      Behind eye 2. ONSET: "When did the headache start?" (Minutes, hours or days)      1 weeks 3. PATTERN: "Does the pain come and go, or has it been constant since it started?"     constant 4. SEVERITY: "How bad is the pain?" and "What does it keep you from doing?"  (e.g., Scale 1-10; mild, moderate, or severe)   - MILD (1-3): doesn't interfere with normal activities    - MODERATE (4-7): interferes with normal activities or awakens from sleep    - SEVERE (8-10): excruciating pain, unable to do any normal activities        8/10 5. RECURRENT SYMPTOM: "Have you ever had headaches before?" If Yes, ask: "When was the last time?" and "What happened that time?"      Prone to headache- this is different  9. OTHER SYMPTOMS: "Do you have any other symptoms?" (fever, stiff neck, eye pain, sore throat, cold symptoms)     Eye - red, discharge, fever 2 days  Protocols used: Headache-A-AH

## 2023-02-26 NOTE — Telephone Encounter (Signed)
  Chief Complaint: headache, eye irritation- redness, discharge Symptoms: headache- not like normal headace, eye redness- lids, discharge, fever- 2 days Frequency: 1 week Pertinent Negatives: Patient denies other cold symptoms Disposition: [] ED /[] Urgent Care (no appt availability in office) / [] Appointment(In office/virtual)/ []  [place] [facility]/ [] Home Care/ [] Refused Recommended Disposition /[] [place] [facility]/ [x]  Follow-up with PCP Additional Notes: Attempted to call office to see if patient could be scheduled tomorrow- no answer. Patient was requesting virtual visit- but with symptoms- provider may want her in office.  Please let patient know if she can be scheduled tomorrow

## 2023-02-27 ENCOUNTER — Telehealth: Payer: Self-pay

## 2023-02-27 NOTE — Telephone Encounter (Signed)
Copied from CRM [id number]. Topic: Appointment Scheduling - Scheduling Inquiry for Clinic >> Feb 26, 2023  4:19 PM Albin Felling L wrote: Reason for CRM: Pt calling back to follow up on appointment request sent earlier today by NT.   Please follow up with patient to schedule.

## 2023-03-02 ENCOUNTER — Ambulatory Visit: Payer: 59 | Admitting: Family Medicine

## 2023-03-02 ENCOUNTER — Ambulatory Visit: Payer: 59 | Attending: Medical | Admitting: Medical

## 2023-03-02 ENCOUNTER — Encounter: Payer: Self-pay | Admitting: Family Medicine

## 2023-03-02 VITALS — BP 134/78 | HR 98 | Ht 65.0 in

## 2023-03-02 DIAGNOSIS — B379 Candidiasis, unspecified: Secondary | ICD-10-CM

## 2023-03-02 DIAGNOSIS — H1033 Unspecified acute conjunctivitis, bilateral: Secondary | ICD-10-CM

## 2023-03-02 DIAGNOSIS — J0110 Acute frontal sinusitis, unspecified: Secondary | ICD-10-CM

## 2023-03-02 MED ORDER — LEVOFLOXACIN 500 MG PO TABS
500.0000 mg | ORAL_TABLET | Freq: Every day | ORAL | 0 refills | Status: DC
Start: 2023-03-02 — End: 2023-04-04

## 2023-03-02 MED ORDER — FLUTICASONE PROPIONATE 50 MCG/ACT NA SUSP: 2.0000 | Freq: Every day | NASAL | 0 refills | Status: AC

## 2023-03-02 MED ORDER — POLYMYXIN B-TRIMETHOPRIM 10000-0.1 UNIT/ML-% OP SOLN: 1.0000 [drp] | Freq: Four times a day (QID) | OPHTHALMIC | 0 refills | Status: DC

## 2023-03-02 MED ORDER — FLUCONAZOLE 150 MG PO TABS: ORAL_TABLET | ORAL | 0 refills | Status: DC

## 2023-03-02 NOTE — Progress Notes (Signed)
Subjective:    Patient ID: [name], female    DOB: 1965/03/22, 58 y.o.   MRN: [id number]  [name] is a 58 y.o. female presenting on 03/02/2023 for Conjunctivitis   HPI  Discussed the use of AI scribe software for clinical note transcription with the patient, who gave verbal consent to proceed.  History of Present Illness   [name] is a 58 year old female who presents with red, swollen eyelids and discharge.  She has experienced redness and swelling of both upper and lower eyelids since Thursday, with significant discharge described as thicker and sticky causing her eyes to be almost shut upon waking. A fever was present for two days but resolved by Saturday, although the eye symptoms persist. No styes or bumps on her eyelids. Improve w/ warm compresses.  She reports a sensation of pressure and 'bubbling, popping, crackling' in her ears, particularly noticeable when blowing her nose. This has been ongoing alongside her eye symptoms.  She has a constant, throbbing headache for four to five days, described as 'nasty' and attributed to sinus issues, located behind her eyes. Admits sinus pressure congestion  She suspects a urinary tract infection due to a sensation of pressure in the lower abdomen, although this was not initially the focus of her visit.         05/16/2022    3:34 PM 05/06/2022    2:43 PM 05/15/2021    1:59 PM  Depression screen PHQ 2/9  Decreased Interest 1 2 2   Down, Depressed, Hopeless 1 0 0  PHQ - 2 Score 2 2 2   Altered sleeping 1 3 3   Tired, decreased energy 1 2 3   Change in appetite 0 2 3  Feeling bad or failure about yourself  0 0 0  Trouble concentrating 0 2 2  Moving slowly or fidgety/restless 0 1 2  Suicidal thoughts 0 0 0  PHQ-9 Score 4 12 15   Difficult doing work/chores Somewhat difficult Somewhat difficult Very difficult       05/06/2022    2:44 PM 05/15/2021    1:59 PM 04/17/2021    2:35 PM 01/18/2021    1:11 PM  GAD 7 : Generalized  Anxiety Score  Nervous, Anxious, on Edge 1 2 2  0  Control/stop worrying 1 0 0 0  Worry too much - different things 1 2 1  0  Trouble relaxing 1 3 3 1   Restless 1 3 3    Easily annoyed or irritable 2 3 3 1   Afraid - awful might happen 0 0 0 0  Total GAD 7 Score 7 13 12    Anxiety Difficulty Not difficult at all Somewhat difficult Not difficult at all Not difficult at all    Social History   Tobacco Use   Smoking status: Every Day    Current packs/day: 1.00    Average packs/day: 1 pack/day for 20.0 years (20.0 ttl pk-yrs)    Types: Cigarettes   Smokeless tobacco: Never  Vaping Use   Vaping status: Never Used  Substance Use Topics   Alcohol use: No   Drug use: Yes    Types: Marijuana, Oxycodone    Comment: last smoked 2 days ago  8/4    Review of Systems Per HPI unless specifically indicated above     Objective:    BP 134/78   Pulse 98   Ht 5\' 5"  (1.651 m)   SpO2 94%   BMI 38.27 kg/m   Wt Readings from Last  3 Encounters:  11/19/22 230 lb (104.3 kg)  09/16/22 244 lb (110.7 kg)  08/27/22 244 lb (110.7 kg)    Physical Exam Vitals and nursing note reviewed.  Constitutional:      General: She is not in acute distress.    Appearance: She is well-developed. She is obese. She is not diaphoretic.     Comments: Well-appearing, comfortable, cooperative  HENT:     Head: Normocephalic and atraumatic.     Ears:     Comments: TM effusion R>L pressure with bulging, no erythema.    Nose: Congestion present.  Eyes:     General:        Right eye: No discharge.        Left eye: No discharge.     Extraocular Movements: Extraocular movements intact.     Conjunctiva/sclera: Conjunctivae normal.     Pupils: Pupils are equal, round, and reactive to light.     Comments: No periorbital edema or erythema or eyelid symptoms or stye at this time  Neck:     Thyroid: No thyromegaly.  Cardiovascular:     Rate and Rhythm: Normal rate and regular rhythm.     Heart sounds: Normal heart  sounds. No murmur heard. Pulmonary:     Effort: Pulmonary effort is normal. No respiratory distress.     Breath sounds: Normal breath sounds. No wheezing or rales.  Musculoskeletal:        General: Normal range of motion.     Cervical back: Normal range of motion and neck supple.  Lymphadenopathy:     Cervical: No cervical adenopathy.  Skin:    General: Skin is warm and dry.     Findings: No erythema or rash.  Neurological:     Mental Status: She is alert and oriented to person, place, and time.  Psychiatric:        Behavior: Behavior normal.     Comments: Well groomed, good eye contact, normal speech and thoughts     Results for orders placed or performed in visit on 11/04/22  Microalbumin, urine   Collection Time: 11/04/22  2:28 PM  Result Value Ref Range   Microalb, Ur 0.2 mg/dL   RAM        Assessment & Plan:   Problem List Items Addressed This Visit   None Visit Diagnoses       Acute conjunctivitis of both eyes, unspecified acute conjunctivitis type    -  Primary   Relevant Medications   trimethoprim-polymyxin b (POLYTRIM) ophthalmic solution     Acute non-recurrent frontal sinusitis       Relevant Medications   fluconazole (DIFLUCAN) 150 MG tablet   levofloxacin (LEVAQUIN) 500 MG tablet   fluticasone (FLONASE) 50 MCG/ACT nasal spray     Antibiotic-induced yeast infection       Relevant Medications   fluconazole (DIFLUCAN) 150 MG tablet        Conjunctivitis Bilateral redness, discharge, and crusting. Likely bacterial given the purulent discharge. -Start antibiotic eye drops (polytrim), one drop in each eye four times a day for 7-10 days.  Sinusitis/Otitis Media Eustachian Tube Dysfunction Complaints of ear pressure, popping, and crackling. Examination revealed fluid behind the eardrum. Possible associated sinusitis given the concurrent symptoms of headache and pressure. -Continue Astelin nasal spray. -Add Flonase nasal spray to help open sinuses and  promote drainage of fluid behind the eardrum. -Start Levaquin if symptoms worsen or do not improve.  Possible Urinary Tract Infection Patient reports feeling pressure, suggestive of  a possible UTI. Did not test today not able to get specimen -Start Levaquin if symptoms worsen or do not improve.  Potential Drug Interaction Noted potential interaction between Levaquin and Seroquel (quetiapine), which could cause prolonged QTc, a type of heart arrhythmia. -Consider temporary discontinuation of Seroquel while on Levaquin.  Yeast Infection Prophylaxis Given the planned use of antibiotics, there is a risk of developing a yeast infection. -Provide prophylactic yeast medication.         No orders of the defined types were placed in this encounter.   Meds ordered this encounter  Medications   trimethoprim-polymyxin b (POLYTRIM) ophthalmic solution    Sig: Place 1 drop into both eyes 4 (four) times daily. For 7-10 days    Dispense:  10 mL    Refill:  0   fluconazole (DIFLUCAN) 150 MG tablet    Sig: Take one tablet by mouth on Day 1. Repeat dose 2nd tablet on Day 3.    Dispense:  2 tablet    Refill:  0   levofloxacin (LEVAQUIN) 500 MG tablet    Sig: Take 1 tablet (500 mg total) by mouth daily. For 7 days    Dispense:  7 tablet    Refill:  0   fluticasone (FLONASE) 50 MCG/ACT nasal spray    Sig: Place 2 sprays into both nostrils daily. Use for 4-6 weeks then stop and use seasonally or as needed.    Dispense:  16 g    Refill:  0    Follow up plan: Return if symptoms worsen or fail to improve.  Saralyn Pilar, DO [facility] 03/02/2023, 4:00 PM

## 2023-03-02 NOTE — Progress Notes (Deleted)
  Cardiology Office Note:  .   Date:  03/02/2023  ID:  [name], DOB 07-02-1965, MRN [id number] PCP: Smitty Cords, DO  [facility] Providers Cardiologist:  Debbe Odea, MD { Click to update primary MD,subspecialty MD or APP then REFRESH:1}   History of Present Illness: .   [name] is a 58 y.o. female with a history of edema, smoker times with years, PAD, stroke in 2013 who presents for follow-up.  Echo 2019 showed EF of 60 to 65%.  Patient is wheelchair-bound.  She reports right leg and arm weakness after having a stroke in 2013.  She has done physical therapy without significant improvement.  She was seen in October 2024 with hypotension and dizziness.  With systolics in the 80s she was started on midodrine.  Echocardiogram was ordered.  Today,  ROS: ***  Studies Reviewed: .        *** Risk Assessment/Calculations:   {Does this patient have ATRIAL FIBRILLATION?:[id number]} No BP recorded.  {Refresh Note OR Click here to enter BP  :1}***       Physical Exam:   VS:  There were no vitals taken for this visit.   Wt Readings from Last 3 Encounters:  11/19/22 230 lb (104.3 kg)  09/16/22 244 lb (110.7 kg)  08/27/22 244 lb (110.7 kg)    GEN: Well nourished, well developed in no acute distress NECK: No JVD; No carotid bruits CARDIAC: ***RRR, no murmurs, rubs, gallops RESPIRATORY:  Clear to auscultation without rales, wheezing or rhonchi  ABDOMEN: Soft, non-tender, non-distended EXTREMITIES:  No edema; No deformity   ASSESSMENT AND PLAN: .   ***    {Are you ordering a CV Procedure (e.g. stress test, cath, DCCV, TEE, etc)?   Press F2        :[id number]}  Dispo: ***  Signed, Delane Stalling David Stall, PA-C

## 2023-03-02 NOTE — Patient Instructions (Addendum)
Thank you for coming to the office today.  Conjunctivitis eye infection  Use antibiotic eye drops 1 drop each eye 4 times per day 7-10 days  ----------------------  For fluid pressure Start nasal steroid Flonase 2 sprays in each nostril daily for 4-6 weeks, may repeat course seasonally or as needed   If not improving or sinuses / urinary symptoms worsen  Start taking Levaquin antibiotic 500mg  daily x 7 days  Yeast pill in case needed.   Please schedule a Follow-up Appointment to: Return if symptoms worsen or fail to improve.  If you have any other questions or concerns, please feel free to call the office or send a message through MyChart. You may also schedule an earlier appointment if necessary.  Additionally, you may be receiving a survey about your experience at our office within a few days to 1 week by e-mail or mail. We value your feedback.  Saralyn Pilar, DO [facility], [place]

## 2023-03-04 ENCOUNTER — Ambulatory Visit: Payer: 59 | Admitting: Vascular Surgery

## 2023-03-04 ENCOUNTER — Ambulatory Visit (HOSPITAL_COMMUNITY): Payer: 59

## 2023-03-08 ENCOUNTER — Other Ambulatory Visit: Payer: Self-pay | Admitting: Family Medicine

## 2023-03-08 DIAGNOSIS — F331 Major depressive disorder, recurrent, moderate: Secondary | ICD-10-CM

## 2023-03-09 ENCOUNTER — Encounter (INDEPENDENT_AMBULATORY_CARE_PROVIDER_SITE_OTHER): Payer: 59 | Admitting: Family Medicine

## 2023-03-09 ENCOUNTER — Ambulatory Visit: Payer: Self-pay | Admitting: Family Medicine

## 2023-03-09 DIAGNOSIS — E11621 Type 2 diabetes mellitus with foot ulcer: Secondary | ICD-10-CM

## 2023-03-09 DIAGNOSIS — L97519 Non-pressure chronic ulcer of other part of right foot with unspecified severity: Secondary | ICD-10-CM | POA: Diagnosis not present

## 2023-03-09 MED ORDER — SULFAMETHOXAZOLE-TRIMETHOPRIM 800-160 MG PO TABS
1.0000 | ORAL_TABLET | Freq: Two times a day (BID) | ORAL | 0 refills | Status: AC
Start: 2023-03-09 — End: 2023-03-16

## 2023-03-09 MED ORDER — MUPIROCIN 2 % EX OINT
1.0000 | TOPICAL_OINTMENT | Freq: Two times a day (BID) | CUTANEOUS | 0 refills | Status: DC
Start: 2023-03-09 — End: 2023-10-26

## 2023-03-09 NOTE — Telephone Encounter (Signed)
 Called patient. See mychart message for more info.  I advised that toe ulceration should be evaluated. She could not get in to be seen.  I will send her Mupirocin ointment + oral antibiotic and recommend Podiatry ( she has seen them before, she can call to schedule )  Seek care more urgently if worsening, spreading sign of infection  Saralyn Pilar, DO [facility] 03/09/2023, 5:23 PM

## 2023-03-09 NOTE — Telephone Encounter (Signed)
 Copied from CRM [id number]. Topic: Clinical - Red Word Triage >> Mar 09, 2023  2:43 PM Albin Felling L wrote: Red Word that prompted transfer to Nurse Triage: Wound left toe, painful Requesting virtual appointment  Chief Complaint: wound to left big toe Symptoms: wound, pain,  Frequency: constant Pertinent Negatives: Patient denies fever, sob, red marks Disposition: [] ED /[] Urgent Care (no appt availability in office) / [] Appointment(In office/virtual)/ []  [place] [facility]/ [] Home Care/ [x] Refused Recommended Disposition /[] [place] [facility]/ []  Follow-up with PCP Additional Notes: per protocol patient to make apt to be seen within 24 hours.  Patient declined apt states doctor will call her something in.  Instructed to go to ER.  PCP office updated.   Reason for Disposition  [1] Looks infected (spreading redness, red streak, pus) AND [2] no fever  Answer Assessment - Initial Assessment Questions 1. APPEARANCE of BLISTER: "What does it look like?"     Blister with white top, took top off and it got worse 2. SIZE: "How large is the blister?" (inches, cm or compare to coins)     Whole top of my "big toe" 3. LOCATION: "Where are the blisters located?"      Left big toe 4. WHEN: "When did the blister happen?"     unknown 5. CAUSE: "What do you think caused the blister?"     unknown 6. PAIN: "Does it hurt?" If Yes, ask: "How bad is the pain?"  (Scale 1-10; or mild, moderate, severe)     7/10 7. OTHER SYMPTOMS: "Do you have any other symptoms?" (e.g., fever)     Toe is red and down to first joint.  Protocols used: Blister - Foot and Hand-A-AH

## 2023-03-10 NOTE — Telephone Encounter (Signed)
 Requested Prescriptions  Pending Prescriptions Disp Refills   citalopram (CELEXA) 40 MG tablet [Pharmacy Med Name: Citalopram Hydrobromide 40 MG Oral Tablet] 100 tablet 1    Sig: TAKE 1 TABLET BY MOUTH DAILY     Psychiatry:  Antidepressants - SSRI Passed - 03/10/2023 10:27 AM      Passed - Completed PHQ-2 or PHQ-9 in the last 360 days      Passed - Valid encounter within last 6 months    Recent Outpatient Visits           1 month ago Type 2 diabetes mellitus with other specified complication, without long-term current use of insulin [facility])   [place] [facility] [place], Netta Neat, DO   3 months ago Seizure-like activity [facility])   [place] [facility] [place], Netta Neat, DO   5 months ago Hemiparesis of right dominant side as late effect of cerebrovascular disease, unspecified cerebrovascular disease type [facility])   [facility] Smitty Cords, DO   10 months ago Chronic pain syndrome   [facility] Smitty Cords, DO   10 months ago Acute cystitis without hematuria   [place] [facility] Smitty Cords, DO       Future Appointments             In 1 month Furth, Cadence H, PA-C [employer] [facility] [place]

## 2023-03-12 DIAGNOSIS — E119 Type 2 diabetes mellitus without complications: Secondary | ICD-10-CM | POA: Diagnosis not present

## 2023-03-12 DIAGNOSIS — M869 Osteomyelitis, unspecified: Secondary | ICD-10-CM | POA: Diagnosis not present

## 2023-03-12 DIAGNOSIS — F17210 Nicotine dependence, cigarettes, uncomplicated: Secondary | ICD-10-CM | POA: Diagnosis not present

## 2023-03-12 DIAGNOSIS — Z1152 Encounter for screening for COVID-19: Secondary | ICD-10-CM | POA: Diagnosis not present

## 2023-03-12 DIAGNOSIS — S91102A Unspecified open wound of left great toe without damage to nail, initial encounter: Secondary | ICD-10-CM | POA: Diagnosis not present

## 2023-03-12 DIAGNOSIS — I739 Peripheral vascular disease, unspecified: Secondary | ICD-10-CM | POA: Diagnosis not present

## 2023-03-12 DIAGNOSIS — R339 Retention of urine, unspecified: Secondary | ICD-10-CM | POA: Diagnosis not present

## 2023-03-12 DIAGNOSIS — Z7902 Long term (current) use of antithrombotics/antiplatelets: Secondary | ICD-10-CM | POA: Diagnosis not present

## 2023-03-12 DIAGNOSIS — J9611 Chronic respiratory failure with hypoxia: Secondary | ICD-10-CM | POA: Diagnosis not present

## 2023-03-12 DIAGNOSIS — L97529 Non-pressure chronic ulcer of other part of left foot with unspecified severity: Secondary | ICD-10-CM | POA: Diagnosis not present

## 2023-03-12 DIAGNOSIS — E785 Hyperlipidemia, unspecified: Secondary | ICD-10-CM | POA: Diagnosis not present

## 2023-03-12 DIAGNOSIS — E1169 Type 2 diabetes mellitus with other specified complication: Secondary | ICD-10-CM | POA: Diagnosis not present

## 2023-03-12 DIAGNOSIS — Z8673 Personal history of transient ischemic attack (TIA), and cerebral infarction without residual deficits: Secondary | ICD-10-CM | POA: Diagnosis not present

## 2023-03-12 DIAGNOSIS — E1143 Type 2 diabetes mellitus with diabetic autonomic (poly)neuropathy: Secondary | ICD-10-CM | POA: Diagnosis not present

## 2023-03-12 DIAGNOSIS — J449 Chronic obstructive pulmonary disease, unspecified: Secondary | ICD-10-CM | POA: Diagnosis not present

## 2023-03-12 DIAGNOSIS — Z7982 Long term (current) use of aspirin: Secondary | ICD-10-CM | POA: Diagnosis not present

## 2023-03-12 DIAGNOSIS — E440 Moderate protein-calorie malnutrition: Secondary | ICD-10-CM | POA: Diagnosis not present

## 2023-03-12 DIAGNOSIS — E1151 Type 2 diabetes mellitus with diabetic peripheral angiopathy without gangrene: Secondary | ICD-10-CM | POA: Diagnosis not present

## 2023-03-12 DIAGNOSIS — E1142 Type 2 diabetes mellitus with diabetic polyneuropathy: Secondary | ICD-10-CM | POA: Diagnosis not present

## 2023-03-12 DIAGNOSIS — E1152 Type 2 diabetes mellitus with diabetic peripheral angiopathy with gangrene: Secondary | ICD-10-CM | POA: Diagnosis not present

## 2023-03-12 DIAGNOSIS — E11621 Type 2 diabetes mellitus with foot ulcer: Secondary | ICD-10-CM | POA: Diagnosis not present

## 2023-03-12 DIAGNOSIS — K3184 Gastroparesis: Secondary | ICD-10-CM | POA: Diagnosis not present

## 2023-03-12 DIAGNOSIS — E876 Hypokalemia: Secondary | ICD-10-CM | POA: Diagnosis not present

## 2023-03-12 DIAGNOSIS — M86172 Other acute osteomyelitis, left ankle and foot: Secondary | ICD-10-CM | POA: Diagnosis not present

## 2023-03-12 DIAGNOSIS — I10 Essential (primary) hypertension: Secondary | ICD-10-CM | POA: Diagnosis not present

## 2023-03-12 DIAGNOSIS — I69398 Other sequelae of cerebral infarction: Secondary | ICD-10-CM | POA: Diagnosis not present

## 2023-03-16 ENCOUNTER — Telehealth: Payer: Self-pay

## 2023-03-16 NOTE — Telephone Encounter (Signed)
 Triage/Advice: -pt called stating she is having issues, that she needed to talk to Dr. Randie Heinz b/c she is at [facility] and they are trying to amputate my toe.   -Received call from Dr. Danie Binder reported osteomyelitis in her toe, that pt needed re-vascularization before amputation and she wanted Dr. Randie Heinz to do it.   -Consulted Dr. Randie Heinz who recommended she gets re-vascularization as soon as possible considering osteomyelitis.   -called pt, voiced Dr. Franco Nones recommendation and pt stated they have already set her up for them to do her there in a couple of days.  She states she will just have it done there and can follow- up with Dr. Randie Heinz at a later time.   -notified Dr. Toni Arthurs of pt plan.

## 2023-03-17 ENCOUNTER — Telehealth: Payer: Self-pay

## 2023-03-17 NOTE — Transitions of Care (Post Inpatient/ED Visit) (Signed)
   03/17/2023  Name: [name] MRN: [id number] DOB: August 25, 1965  Today's TOC FU Call Status: Today's TOC FU Call Status:: Unsuccessful Call (1st Attempt) Unsuccessful Call (1st Attempt) Date: 03/17/23  Attempted to reach the patient regarding the most recent Inpatient/ED visit.  Follow Up Plan: Additional outreach attempts will be made to reach the patient to complete the Transitions of Care (Post Inpatient/ED visit) call.   Deidre Ala, BSN, RN [place]  [facility] [employer] [facility] RN Care Manager [phone]

## 2023-03-18 ENCOUNTER — Encounter: Payer: Self-pay | Admitting: Family Medicine

## 2023-03-18 NOTE — Telephone Encounter (Signed)
 Called patient to review recent hospitalization and her procedure upcoming. No further questions. I advised that I agree with the current plan and attempt at re-vascularization angioplasty and then proceed w/ toe amputation as indicated if it is required afterwards. Questions answered.  Saralyn Pilar, DO [facility] 03/18/2023, 5:11 PM

## 2023-03-18 NOTE — Telephone Encounter (Signed)
 FYI. She is having vascular surgery on Friday to get more blood flow to her foot She is having her toe amputated with in the next 2 weeks.  Wanted to let you know.

## 2023-03-19 ENCOUNTER — Telehealth: Payer: Self-pay

## 2023-03-19 NOTE — Transitions of Care (Post Inpatient/ED Visit) (Signed)
   03/19/2023  Name: CAMRI MOLLOY MRN: [id number] DOB: November 22, 1965  Today's TOC FU Call Status: Today's TOC FU Call Status:: Unsuccessful Call (2nd Attempt) Unsuccessful Call (1st Attempt) Date: 03/17/23 Unsuccessful Call (2nd Attempt) Date: 03/19/23  Attempted to reach the patient regarding the most recent Inpatient/ED visit.  Follow Up Plan: Additional outreach attempts will be made to reach the patient to complete the Transitions of Care (Post Inpatient/ED visit) call.   Deidre Ala, BSN, RN [place]  [facility] - [employer] [facility] RN Care Manager [phone]

## 2023-03-25 ENCOUNTER — Inpatient Hospital Stay
Admission: EM | Admit: 2023-03-25 | Discharge: 2023-04-04 | DRG: 253 | Disposition: A | Discharge status: Home / Self Care | Attending: Internal Medicine | Admitting: Internal Medicine

## 2023-03-25 ENCOUNTER — Other Ambulatory Visit: Payer: Self-pay

## 2023-03-25 ENCOUNTER — Emergency Department

## 2023-03-25 DIAGNOSIS — F17210 Nicotine dependence, cigarettes, uncomplicated: Secondary | ICD-10-CM | POA: Diagnosis present

## 2023-03-25 DIAGNOSIS — M86172 Other acute osteomyelitis, left ankle and foot: Secondary | ICD-10-CM

## 2023-03-25 DIAGNOSIS — I739 Peripheral vascular disease, unspecified: Secondary | ICD-10-CM

## 2023-03-25 DIAGNOSIS — Z880 Allergy status to penicillin: Secondary | ICD-10-CM

## 2023-03-25 DIAGNOSIS — A419 Sepsis, unspecified organism: Principal | ICD-10-CM

## 2023-03-25 DIAGNOSIS — M869 Osteomyelitis, unspecified: Secondary | ICD-10-CM

## 2023-03-25 DIAGNOSIS — R42 Dizziness and giddiness: Secondary | ICD-10-CM | POA: Diagnosis not present

## 2023-03-25 DIAGNOSIS — F1290 Cannabis use, unspecified, uncomplicated: Secondary | ICD-10-CM | POA: Diagnosis present

## 2023-03-25 DIAGNOSIS — Z885 Allergy status to narcotic agent status: Secondary | ICD-10-CM

## 2023-03-25 DIAGNOSIS — I69351 Hemiplegia and hemiparesis following cerebral infarction affecting right dominant side: Secondary | ICD-10-CM | POA: Diagnosis not present

## 2023-03-25 DIAGNOSIS — E785 Hyperlipidemia, unspecified: Secondary | ICD-10-CM | POA: Diagnosis present

## 2023-03-25 DIAGNOSIS — Z794 Long term (current) use of insulin: Secondary | ICD-10-CM | POA: Diagnosis not present

## 2023-03-25 DIAGNOSIS — K760 Fatty (change of) liver, not elsewhere classified: Secondary | ICD-10-CM | POA: Diagnosis present

## 2023-03-25 DIAGNOSIS — F419 Anxiety disorder, unspecified: Secondary | ICD-10-CM

## 2023-03-25 DIAGNOSIS — Z7401 Bed confinement status: Secondary | ICD-10-CM

## 2023-03-25 DIAGNOSIS — I10 Essential (primary) hypertension: Secondary | ICD-10-CM | POA: Diagnosis not present

## 2023-03-25 DIAGNOSIS — L97526 Non-pressure chronic ulcer of other part of left foot with bone involvement without evidence of necrosis: Secondary | ICD-10-CM | POA: Diagnosis present

## 2023-03-25 DIAGNOSIS — Z881 Allergy status to other antibiotic agents status: Secondary | ICD-10-CM

## 2023-03-25 DIAGNOSIS — D649 Anemia, unspecified: Secondary | ICD-10-CM | POA: Diagnosis not present

## 2023-03-25 DIAGNOSIS — E872 Acidosis, unspecified: Secondary | ICD-10-CM | POA: Diagnosis present

## 2023-03-25 DIAGNOSIS — M85872 Other specified disorders of bone density and structure, left ankle and foot: Secondary | ICD-10-CM | POA: Diagnosis not present

## 2023-03-25 DIAGNOSIS — I959 Hypotension, unspecified: Secondary | ICD-10-CM | POA: Diagnosis not present

## 2023-03-25 DIAGNOSIS — K089 Disorder of teeth and supporting structures, unspecified: Secondary | ICD-10-CM | POA: Diagnosis present

## 2023-03-25 DIAGNOSIS — J449 Chronic obstructive pulmonary disease, unspecified: Secondary | ICD-10-CM | POA: Diagnosis present

## 2023-03-25 DIAGNOSIS — E875 Hyperkalemia: Secondary | ICD-10-CM | POA: Diagnosis present

## 2023-03-25 DIAGNOSIS — E66812 Obesity, class 2: Secondary | ICD-10-CM | POA: Diagnosis present

## 2023-03-25 DIAGNOSIS — Z79899 Other long term (current) drug therapy: Secondary | ICD-10-CM

## 2023-03-25 DIAGNOSIS — Z7984 Long term (current) use of oral hypoglycemic drugs: Secondary | ICD-10-CM | POA: Diagnosis not present

## 2023-03-25 DIAGNOSIS — M86472 Chronic osteomyelitis with draining sinus, left ankle and foot: Secondary | ICD-10-CM | POA: Diagnosis not present

## 2023-03-25 DIAGNOSIS — Z981 Arthrodesis status: Secondary | ICD-10-CM

## 2023-03-25 DIAGNOSIS — Z833 Family history of diabetes mellitus: Secondary | ICD-10-CM

## 2023-03-25 DIAGNOSIS — E11621 Type 2 diabetes mellitus with foot ulcer: Secondary | ICD-10-CM | POA: Diagnosis present

## 2023-03-25 DIAGNOSIS — E669 Obesity, unspecified: Secondary | ICD-10-CM | POA: Diagnosis present

## 2023-03-25 DIAGNOSIS — Z8249 Family history of ischemic heart disease and other diseases of the circulatory system: Secondary | ICD-10-CM

## 2023-03-25 DIAGNOSIS — Z6838 Body mass index (BMI) 38.0-38.9, adult: Secondary | ICD-10-CM

## 2023-03-25 DIAGNOSIS — Z01810 Encounter for preprocedural cardiovascular examination: Secondary | ICD-10-CM | POA: Diagnosis not present

## 2023-03-25 DIAGNOSIS — Z90710 Acquired absence of both cervix and uterus: Secondary | ICD-10-CM

## 2023-03-25 DIAGNOSIS — I7090 Unspecified atherosclerosis: Secondary | ICD-10-CM | POA: Diagnosis not present

## 2023-03-25 DIAGNOSIS — R000 Tachycardia, unspecified: Secondary | ICD-10-CM | POA: Diagnosis not present

## 2023-03-25 DIAGNOSIS — R509 Fever, unspecified: Secondary | ICD-10-CM | POA: Diagnosis not present

## 2023-03-25 DIAGNOSIS — R609 Edema, unspecified: Secondary | ICD-10-CM | POA: Diagnosis not present

## 2023-03-25 DIAGNOSIS — Z555 Less than a high school diploma: Secondary | ICD-10-CM

## 2023-03-25 DIAGNOSIS — E1152 Type 2 diabetes mellitus with diabetic peripheral angiopathy with gangrene: Secondary | ICD-10-CM | POA: Diagnosis present

## 2023-03-25 DIAGNOSIS — M19072 Primary osteoarthritis, left ankle and foot: Secondary | ICD-10-CM | POA: Diagnosis not present

## 2023-03-25 DIAGNOSIS — Z87442 Personal history of urinary calculi: Secondary | ICD-10-CM

## 2023-03-25 DIAGNOSIS — Z8505 Personal history of malignant neoplasm of liver: Secondary | ICD-10-CM

## 2023-03-25 DIAGNOSIS — Z79891 Long term (current) use of opiate analgesic: Secondary | ICD-10-CM

## 2023-03-25 DIAGNOSIS — E1169 Type 2 diabetes mellitus with other specified complication: Secondary | ICD-10-CM | POA: Diagnosis not present

## 2023-03-25 DIAGNOSIS — Z720 Tobacco use: Secondary | ICD-10-CM | POA: Diagnosis present

## 2023-03-25 DIAGNOSIS — I6930 Unspecified sequelae of cerebral infarction: Secondary | ICD-10-CM

## 2023-03-25 DIAGNOSIS — F32A Depression, unspecified: Secondary | ICD-10-CM | POA: Diagnosis present

## 2023-03-25 DIAGNOSIS — K5900 Constipation, unspecified: Secondary | ICD-10-CM | POA: Diagnosis present

## 2023-03-25 DIAGNOSIS — E1151 Type 2 diabetes mellitus with diabetic peripheral angiopathy without gangrene: Secondary | ICD-10-CM

## 2023-03-25 DIAGNOSIS — K219 Gastro-esophageal reflux disease without esophagitis: Secondary | ICD-10-CM | POA: Diagnosis present

## 2023-03-25 DIAGNOSIS — G8929 Other chronic pain: Secondary | ICD-10-CM | POA: Diagnosis present

## 2023-03-25 DIAGNOSIS — Z96653 Presence of artificial knee joint, bilateral: Secondary | ICD-10-CM | POA: Diagnosis present

## 2023-03-25 DIAGNOSIS — Z8719 Personal history of other diseases of the digestive system: Secondary | ICD-10-CM

## 2023-03-25 DIAGNOSIS — I70202 Unspecified atherosclerosis of native arteries of extremities, left leg: Secondary | ICD-10-CM | POA: Diagnosis not present

## 2023-03-25 DIAGNOSIS — Z7902 Long term (current) use of antithrombotics/antiplatelets: Secondary | ICD-10-CM

## 2023-03-25 DIAGNOSIS — Z716 Tobacco abuse counseling: Secondary | ICD-10-CM

## 2023-03-25 DIAGNOSIS — Z7985 Long-term (current) use of injectable non-insulin antidiabetic drugs: Secondary | ICD-10-CM

## 2023-03-25 LAB — COMPREHENSIVE METABOLIC PANEL
ALT: 11 U/L (ref 0–44)
AST: 15 U/L (ref 15–41)
Albumin: 3.3 g/dL — ABNORMAL LOW (ref 3.5–5.0)
Alkaline Phosphatase: 54 U/L (ref 38–126)
Anion gap: 11 (ref 5–15)
BUN: 22 mg/dL — ABNORMAL HIGH (ref 6–20)
CO2: 18 mmol/L — ABNORMAL LOW (ref 22–32)
Calcium: 8.9 mg/dL (ref 8.9–10.3)
Chloride: 108 mmol/L (ref 98–111)
Creatinine, Ser: 0.7 mg/dL (ref 0.44–1.00)
GFR, Estimated: >60 mL/min (ref >60)
Glucose, Bld: 146 mg/dL — ABNORMAL HIGH (ref 70–99)
Potassium: 3.5 mmol/L (ref 3.5–5.1)
Sodium: 137 mmol/L (ref 135–145)
Total Bilirubin: 0.7 mg/dL (ref 0.0–1.2)
Total Protein: 6.8 g/dL (ref 6.5–8.1)

## 2023-03-25 LAB — CBC WITH DIFFERENTIAL/PLATELET
Abs Immature Granulocytes: 0.09 10*3/uL — ABNORMAL HIGH (ref 0.00–0.07)
Basophils Absolute: 0 10*3/uL (ref 0.0–0.1)
Basophils Relative: 0 %
Eosinophils Absolute: 0 10*3/uL (ref 0.0–0.5)
Eosinophils Relative: 0 %
HCT: 45 % (ref 36.0–46.0)
Hemoglobin: 15 g/dL (ref 12.0–15.0)
Immature Granulocytes: 1 %
Lymphocytes Relative: 20 %
Lymphs Abs: 3 10*3/uL (ref 0.7–4.0)
MCH: 29.6 pg (ref 26.0–34.0)
MCHC: 33.3 g/dL (ref 30.0–36.0)
MCV: 88.8 fL (ref 80.0–100.0)
Monocytes Absolute: 0.9 10*3/uL (ref 0.1–1.0)
Monocytes Relative: 6 %
Neutro Abs: 11.1 10*3/uL — ABNORMAL HIGH (ref 1.7–7.7)
Neutrophils Relative %: 73 %
Platelets: 389 10*3/uL (ref 150–400)
RBC: 5.07 MIL/uL (ref 3.87–5.11)
RDW: 13.4 % (ref 11.5–15.5)
WBC: 15.2 10*3/uL — ABNORMAL HIGH (ref 4.0–10.5)
nRBC: 0 % (ref 0.0–0.2)

## 2023-03-25 LAB — LACTIC ACID, PLASMA
Lactic Acid, Venous: 2 mmol/L (ref 0.5–1.9)
Lactic Acid, Venous: 1.5 mmol/L (ref 0.5–1.9)

## 2023-03-25 LAB — APTT: aPTT: 28 s (ref 24–36)

## 2023-03-25 LAB — PROTIME-INR
INR: 1.1 (ref 0.8–1.2)
Prothrombin Time: 14.4 s (ref 11.4–15.2)

## 2023-03-25 LAB — CBG MONITORING, ED: Glucose-Capillary: 115 mg/dL — ABNORMAL HIGH (ref 70–99)

## 2023-03-25 LAB — SEDIMENTATION RATE: Sed Rate: 15 mm/h (ref 0–30)

## 2023-03-25 MED ORDER — NICOTINE 21 MG/24HR TD PT24
21.0000 mg | MEDICATED_PATCH | Freq: Every day | TRANSDERMAL | Status: DC
  Administered 2023-03-25 – 2023-04-04 (×11): 21 mg via TRANSDERMAL
  Filled 2023-03-25 (×11): qty 1

## 2023-03-25 MED ORDER — FLUTICASONE PROPIONATE 50 MCG/ACT NA SUSP
2.0000 | Freq: Every day | NASAL | Status: DC
  Administered 2023-03-26 – 2023-03-31 (×5): 2 via NASAL
  Filled 2023-03-25 (×3): qty 16

## 2023-03-25 MED ORDER — UMECLIDINIUM BROMIDE 62.5 MCG/ACT IN AEPB
1.0000 | INHALATION_SPRAY | Freq: Every day | RESPIRATORY_TRACT | Status: DC
  Administered 2023-03-26 – 2023-04-04 (×10): 1 via RESPIRATORY_TRACT
  Filled 2023-03-25 (×4): qty 7

## 2023-03-25 MED ORDER — INSULIN ASPART 100 UNIT/ML IJ SOLN
0.0000 [IU] | Freq: Every day | INTRAMUSCULAR | Status: DC
  Administered 2023-04-01 – 2023-04-02 (×2): 2 [IU] via SUBCUTANEOUS
  Filled 2023-03-25 (×2): qty 1

## 2023-03-25 MED ORDER — SENNOSIDES-DOCUSATE SODIUM 8.6-50 MG PO TABS
1.0000 | ORAL_TABLET | Freq: Two times a day (BID) | ORAL | Status: DC
  Administered 2023-03-25 – 2023-04-01 (×13): 1 via ORAL
  Filled 2023-03-25 (×14): qty 1

## 2023-03-25 MED ORDER — CYCLOBENZAPRINE HCL 10 MG PO TABS
10.0000 mg | ORAL_TABLET | Freq: Three times a day (TID) | ORAL | Status: DC
  Administered 2023-03-25 – 2023-04-04 (×28): 10 mg via ORAL
  Filled 2023-03-25 (×29): qty 1

## 2023-03-25 MED ORDER — FLUTICASONE FUROATE-VILANTEROL 100-25 MCG/ACT IN AEPB
1.0000 | INHALATION_SPRAY | Freq: Every day | RESPIRATORY_TRACT | Status: DC
  Administered 2023-03-26 – 2023-04-04 (×10): 1 via RESPIRATORY_TRACT
  Filled 2023-03-25 (×3): qty 28

## 2023-03-25 MED ORDER — OXYCODONE-ACETAMINOPHEN 5-325 MG PO TABS
1.0000 | ORAL_TABLET | ORAL | Status: DC | PRN
  Filled 2023-03-25 (×2): qty 1

## 2023-03-25 MED ORDER — VANCOMYCIN HCL IN DEXTROSE 1-5 GM/200ML-% IV SOLN
1000.0000 mg | Freq: Once | INTRAVENOUS | Status: AC
  Administered 2023-03-25: 1000 mg via INTRAVENOUS
  Filled 2023-03-25: qty 200

## 2023-03-25 MED ORDER — LACTATED RINGERS IV BOLUS
1000.0000 mL | Freq: Once | INTRAVENOUS | Status: AC
  Administered 2023-03-25: 1000 mL via INTRAVENOUS

## 2023-03-25 MED ORDER — ACETAMINOPHEN 325 MG PO TABS
650.0000 mg | ORAL_TABLET | Freq: Four times a day (QID) | ORAL | Status: DC | PRN
  Administered 2023-03-29 – 2023-04-03 (×6): 650 mg via ORAL
  Filled 2023-03-25 (×6): qty 2

## 2023-03-25 MED ORDER — VITAMIN D (ERGOCALCIFEROL) 1.25 MG (50000 UNIT) PO CAPS
50000.0000 [IU] | ORAL_CAPSULE | ORAL | Status: DC
  Administered 2023-03-26: 50000 [IU] via ORAL
  Filled 2023-03-25 (×2): qty 1

## 2023-03-25 MED ORDER — BUPROPION HCL ER (XL) 150 MG PO TB24
150.0000 mg | ORAL_TABLET | Freq: Every day | ORAL | Status: DC
  Administered 2023-03-25 – 2023-04-04 (×11): 150 mg via ORAL
  Filled 2023-03-25 (×11): qty 1

## 2023-03-25 MED ORDER — HEPARIN SODIUM (PORCINE) 5000 UNIT/ML IJ SOLN
5000.0000 [IU] | Freq: Three times a day (TID) | INTRAMUSCULAR | Status: DC
  Administered 2023-03-25 – 2023-04-04 (×26): 5000 [IU] via SUBCUTANEOUS
  Filled 2023-03-25 (×27): qty 1

## 2023-03-25 MED ORDER — INSULIN ASPART 100 UNIT/ML IJ SOLN
0.0000 [IU] | Freq: Three times a day (TID) | INTRAMUSCULAR | Status: DC
  Administered 2023-03-26 – 2023-03-30 (×3): 1 [IU] via SUBCUTANEOUS
  Administered 2023-04-02: 3 [IU] via SUBCUTANEOUS
  Administered 2023-04-02 – 2023-04-03 (×3): 1 [IU] via SUBCUTANEOUS
  Administered 2023-04-03: 3 [IU] via SUBCUTANEOUS
  Administered 2023-04-04 (×2): 1 [IU] via SUBCUTANEOUS
  Filled 2023-03-25 (×13): qty 1

## 2023-03-25 MED ORDER — VITAMIN D 25 MCG (1000 UNIT) PO TABS
1000.0000 [IU] | ORAL_TABLET | ORAL | Status: DC
  Filled 2023-03-25: qty 1

## 2023-03-25 MED ORDER — ONDANSETRON HCL 4 MG/2ML IJ SOLN: 4.0000 mg | Freq: Three times a day (TID) | INTRAMUSCULAR | Status: DC | PRN

## 2023-03-25 MED ORDER — CITALOPRAM HYDROBROMIDE 20 MG PO TABS
40.0000 mg | ORAL_TABLET | Freq: Every day | ORAL | Status: DC
  Administered 2023-03-25 – 2023-04-04 (×11): 40 mg via ORAL
  Filled 2023-03-25: qty 4
  Filled 2023-03-25 (×4): qty 2
  Filled 2023-03-25 (×3): qty 4
  Filled 2023-03-25: qty 2
  Filled 2023-03-25 (×2): qty 4

## 2023-03-25 MED ORDER — GABAPENTIN 300 MG PO CAPS
600.0000 mg | ORAL_CAPSULE | Freq: Four times a day (QID) | ORAL | Status: DC
  Administered 2023-03-25 – 2023-04-04 (×37): 600 mg via ORAL
  Filled 2023-03-25 (×36): qty 2

## 2023-03-25 MED ORDER — HYDRALAZINE HCL 20 MG/ML IJ SOLN: 5.0000 mg | INTRAMUSCULAR | Status: DC | PRN

## 2023-03-25 MED ORDER — HYDROMORPHONE HCL 1 MG/ML IJ SOLN
0.5000 mg | Freq: Once | INTRAMUSCULAR | Status: AC
  Administered 2023-03-25: 0.5 mg via INTRAVENOUS
  Filled 2023-03-25: qty 0.5

## 2023-03-25 MED ORDER — MORPHINE SULFATE (PF) 4 MG/ML IV SOLN
4.0000 mg | Freq: Once | INTRAVENOUS | Status: AC
  Administered 2023-03-25: 4 mg via INTRAVENOUS
  Filled 2023-03-25: qty 1

## 2023-03-25 MED ORDER — QUETIAPINE FUMARATE 25 MG PO TABS
100.0000 mg | ORAL_TABLET | Freq: Every day | ORAL | Status: DC
  Administered 2023-03-25 – 2023-04-03 (×10): 100 mg via ORAL
  Filled 2023-03-25 (×10): qty 4

## 2023-03-25 MED ORDER — ATORVASTATIN CALCIUM 20 MG PO TABS
40.0000 mg | ORAL_TABLET | Freq: Every day | ORAL | Status: DC
  Administered 2023-03-25 – 2023-04-03 (×10): 40 mg via ORAL
  Filled 2023-03-25 (×10): qty 2

## 2023-03-25 MED ORDER — PIPERACILLIN-TAZOBACTAM 3.375 G IVPB
3.3750 g | Freq: Three times a day (TID) | INTRAVENOUS | Status: DC
  Administered 2023-03-26 – 2023-04-04 (×27): 3.375 g via INTRAVENOUS
  Filled 2023-03-25 (×28): qty 50

## 2023-03-25 MED ORDER — VANCOMYCIN HCL IN DEXTROSE 1-5 GM/200ML-% IV SOLN
1000.0000 mg | Freq: Two times a day (BID) | INTRAVENOUS | Status: DC
  Administered 2023-03-25 – 2023-03-28 (×7): 1000 mg via INTRAVENOUS
  Filled 2023-03-25 (×9): qty 200

## 2023-03-25 MED ORDER — ALBUTEROL SULFATE (2.5 MG/3ML) 0.083% IN NEBU
2.5000 mg | INHALATION_SOLUTION | RESPIRATORY_TRACT | Status: DC | PRN
  Administered 2023-04-02: 2.5 mg via RESPIRATORY_TRACT

## 2023-03-25 MED ORDER — PIPERACILLIN-TAZOBACTAM 3.375 G IVPB
3.3750 g | Freq: Once | INTRAVENOUS | Status: AC
  Administered 2023-03-25: 3.375 g via INTRAVENOUS
  Filled 2023-03-25: qty 50

## 2023-03-25 MED ORDER — DM-GUAIFENESIN ER 30-600 MG PO TB12: 1.0000 | ORAL_TABLET | Freq: Two times a day (BID) | ORAL | Status: DC | PRN

## 2023-03-25 MED ORDER — HYDROMORPHONE HCL 1 MG/ML IJ SOLN
1.0000 mg | INTRAMUSCULAR | Status: DC | PRN
  Administered 2023-03-25 – 2023-03-26 (×2): 1 mg via INTRAVENOUS
  Filled 2023-03-25 (×2): qty 1

## 2023-03-25 MED ORDER — PANTOPRAZOLE SODIUM 40 MG PO TBEC
40.0000 mg | DELAYED_RELEASE_TABLET | Freq: Every day | ORAL | Status: DC
  Administered 2023-03-25 – 2023-04-04 (×11): 40 mg via ORAL
  Filled 2023-03-25 (×11): qty 1

## 2023-03-25 MED ORDER — POLYETHYLENE GLYCOL 3350 17 G PO PACK: 17.0000 g | PACK | Freq: Every day | ORAL | Status: DC | PRN

## 2023-03-25 NOTE — H&P (Addendum)
 History and Physical    [name] DOB: 09/18/1965 DOA: 03/25/2023  Referring MD/NP/PA:   PCP: Smitty Cords, DO   Patient coming from:  The patient is coming from home.     Chief Complaint: left foot pain  HPI: [name] is a 58 y.o. female with medical history significant of osteomyelitis of left great toe, HTN, HLD, DM, COPD, P:VD, stroke with right-sided weakness,, depression with anxiety, migraine, kidney stone, diverticulitis, tobacco abuse, who presents with left foot pain.  Patient was recently hospitalized to [facility] due to left great toe osteomyelitis. She was discharged home on oral antibiotics of doxycycline and Cipro, with plan for outpatient follow-up with vascular surgery. She has finished a course of antibiotics today, however she has worsening left great toe pain, which is constant, sharp, severe, nonradiating.  Left great toe is erythematous and swelling.  She also developed chills and fever of 100.6 today.  No nausea, vomiting, diarrhea or abdominal pain.  Patient is constipated.  No cough, SOB and chest pain. No symptoms of UTI. It is unable to palpate DP pulses bilaterally,  but was able to obtain biphasic Doppler signals per EDP, Dr, Larinda Buttery.  MRI-left lower extremity on 03/12/23: Distal great toe soft tissue ulcer with active osteomyelitis of the great toe distal phalanx. Abnormal signal involves the entire distal phalanx.  No evidence for soft tissue abscess.   Data reviewed independently and ED Course: pt was found to have WBC 15.2, lactic acid 2.0, GFR> 60, INR 1.1, PTT 28.  Temperature 98.2, blood pressure 117/76, heart rate 112, 96, RR 18, oxygen saturation 100% on room air.  Patient is admitted to MedSurg bed as inpatient. Consulted Dr. Gilda Crease of [facility] and Dr. Annamary Rummage of podiatry.  X-ray of left great toe: Ulcer or wound at the tip of the great toe with osseous destructive change involving tuft of first distal phalanx  consistent with osteomyelitis.    EKG: I have personally reviewed.  Sinus rhythm, QTc 443, low voltage, borderline left axis deviation   Review of Systems:   General: has subjective fevers, chills, no body weight gain, has fatigue HEENT: no blurry vision, hearing changes or sore throat Respiratory: no dyspnea, coughing, wheezing CV: no chest pain, no palpitations GI: no nausea, vomiting, abdominal pain, diarrhea, has constipation GU: no dysuria, burning on urination, increased urinary frequency, hematuria  Ext: no leg edema Neuro: no vision change or hearing loss. Has residual right-sided weakness from previous stroke Skin: Has right great toe pain and swelling MSK: No muscle spasm, no deformity, no limitation of range of movement in spin Heme: No easy bruising.  Travel history: No recent long distant travel.   Allergy:  Allergies  Allergen Reactions   Tramadol Nausea And Vomiting and Hypertension   Augmentin [Amoxicillin-Pot Clavulanate] Rash    Past Medical History:  Diagnosis Date   Anxiety    Cancer (HCC)    a spot on liver and treated    Complication of anesthesia    restless,easily upset   COPD (chronic obstructive pulmonary disease) (HCC)    Depression    Diabetes mellitus without complication (HCC)    Diverticulitis    Fatty liver    GERD (gastroesophageal reflux disease)    Headache(784.0)    migraines   History of kidney stones    Hyperlipidemia    Hypertension    PAD (peripheral artery disease) (HCC)    Pneumonia    Restless    Stroke (HCC)  Past Surgical History:  Procedure Laterality Date   ABDOMINAL HYSTERECTOMY     ACHILLES TENDON LENGTHENING Right 08/18/2017   Procedure: ACHILLES TENDON LENGTHENING;  Surgeon: Myrene Galas, MD;  Location: [facility] OR;  Service: Orthopedics;  Laterality: Right;   ANTERIOR CERVICAL DECOMP/DISCECTOMY FUSION N/A 03/11/2012   Procedure: ANTERIOR CERVICAL DECOMPRESSION/DISCECTOMY FUSION 1 LEVEL;  Surgeon: Cristi Loron, MD;  Location: [facility];  Service: Neurosurgery;  Laterality: N/A;  Cervical five-six anterior cervical decompression with fusion interbody prothesis plating and bonegraft   BACK SURGERY     EXTERNAL FIXATION LEG Right 08/10/2017   Procedure: EXTERNAL FIXATION ANKLE;  Surgeon: Donato Heinz, MD;  Location: [facility] ORS;  Service: Orthopedics;  Laterality: Right;   EXTERNAL FIXATION REMOVAL Right 08/18/2017   Procedure: REMOVAL EXTERNAL FIXATION LEG;  Surgeon: Myrene Galas, MD;  Location: [facility] OR;  Service: Orthopedics;  Laterality: Right;   EYE SURGERY     HERNIA REPAIR     IR FL GUIDED LOC OF NEEDLE/CATH TIP FOR SPINAL INJECTION LT  12/09/2021   IR FL GUIDED LOC OF NEEDLE/CATH TIP FOR SPINAL INJECTION RT  12/09/2021   JOINT REPLACEMENT     bil knees   ORIF ANKLE FRACTURE Right 08/18/2017   Procedure: OPEN REDUCTION INTERNAL FIXATION (ORIF) ANKLE FRACTURE;  Surgeon: Myrene Galas, MD;  Location: [facility] OR;  Service: Orthopedics;  Laterality: Right;   REPLACEMENT TOTAL KNEE BILATERAL      Social History:  reports that she has been smoking cigarettes. She has a 20 pack-year smoking history. She has never used smokeless tobacco. She reports current drug use. Drugs: Marijuana and Oxycodone. She reports that she does not drink alcohol.  Family History:  Family History  Problem Relation Age of Onset   Diabetes Mother    Hypertension Mother    Hypertension Father      Prior to Admission medications   Medication Sig Start Date End Date Taking? Authorizing Provider  albuterol (VENTOLIN HFA) 108 (90 Base) MCG/ACT inhaler INHALE 2 INHALATIONS BY MOUTH  EVERY 4 HOURS AS NEEDED FOR  WHEEZING OR FOR SHORTNESS OF  BREATH 12/30/22   Karamalegos, Netta Neat, DO  atorvastatin (LIPITOR) 40 MG tablet TAKE 1 TABLET BY MOUTH AT  BEDTIME 01/08/23   Karamalegos, Alexander J, DO  azelastine (ASTELIN) 0.1 % nasal spray Place 2 sprays into both nostrils 2 (two) times daily. 10/25/21   Karamalegos, Netta Neat, DO  buPROPion (WELLBUTRIN XL) 150 MG 24 hr tablet Take 1 tablet (150 mg total) by mouth daily. 12/25/22   Karamalegos, Netta Neat, DO  citalopram (CELEXA) 40 MG tablet TAKE 1 TABLET BY MOUTH DAILY 03/10/23   Smitty Cords, DO  clopidogrel (PLAVIX) 75 MG tablet TAKE ONE TABLET BY MOUTH DAILY AT 9AM 06/27/22   Karamalegos, Netta Neat, DO  cyclobenzaprine (FLEXERIL) 10 MG tablet Take 10 mg by mouth 3 (three) times daily.    [provider]  fluconazole (DIFLUCAN) 150 MG tablet Take one tablet by mouth on Day 1. Repeat dose 2nd tablet on Day 3. 03/02/23   Althea Charon, Netta Neat, DO  fluticasone (FLONASE) 50 MCG/ACT nasal spray Place 2 sprays into both nostrils daily. Use for 4-6 weeks then stop and use seasonally or as needed. 03/02/23   Karamalegos, Netta Neat, DO  gabapentin (NEURONTIN) 600 MG tablet TAKE 1 TABLET BY MOUTH 4 TIMES  DAILY 09/23/22   Althea Charon, Netta Neat, DO  levofloxacin (LEVAQUIN) 500 MG tablet Take 1 tablet (500 mg total) by mouth daily.  For 7 days 03/02/23   Smitty Cords, DO  metFORMIN (GLUCOPHAGE) 1000 MG tablet TAKE 1 TABLET BY MOUTH TWICE  DAILY WITH MEALS Patient not taking: Reported on 03/02/2023 09/23/22   Smitty Cords, DO  midodrine (PROAMATINE) 5 MG tablet Take 1 tablet (5 mg total) by mouth 3 (three) times daily with meals. Patient not taking: Reported on 03/02/2023 10/20/22   Debbe Odea, MD  [facility] 10 MG/0.5ML Pen INJECT SUBCUTANEOUSLY 10 MG ONCE WEEKLY 09/23/22   Althea Charon Netta Neat, DO  mupirocin ointment (BACTROBAN) 2 % Apply 1 Application topically 2 (two) times daily. For 10 days 03/09/23   Smitty Cords, DO  nystatin (MYCOSTATIN/NYSTOP) powder APPLY 1 APPLICATION TOPICALLY THREE TIMES DAILY AS NEEDED FOR YEAST RASH INTERTRIGO 06/27/22   Althea Charon, Netta Neat, DO  ondansetron (ZOFRAN-ODT) 4 MG disintegrating tablet Take 1 tablet (4 mg total) by mouth every 8 (eight) hours as needed for nausea or  vomiting. 02/05/23   Althea Charon, Netta Neat, DO  oxyCODONE-acetaminophen (PERCOCET) 10-325 MG tablet Take 1 tablet by mouth every 8 (eight) hours as needed for pain. 05/06/22   Karamalegos, Netta Neat, DO  pantoprazole (PROTONIX) 40 MG tablet TAKE 1 TABLET BY MOUTH DAILY AT  9 AM 02/03/23   Karamalegos, Netta Neat, DO  QUEtiapine (SEROQUEL) 100 MG tablet TAKE 1 TABLET BY MOUTH DAILY AT  9 PM AT BEDTIME 10/07/22   Karamalegos, Alexander J, DO  topiramate (TOPAMAX) 100 MG tablet TAKE 1 TABLET BY MOUTH TWICE  DAILY 10/07/22   Karamalegos, Netta Neat, DO  TRELEGY ELLIPTA 100-62.5-25 MCG/ACT AEPB USE 1 INHALATION BY MOUTH ONCE  DAILY AT THE SAME TIME EACH DAY 02/17/23   Karamalegos, Netta Neat, DO  trimethoprim-polymyxin b (POLYTRIM) ophthalmic solution Place 1 drop into both eyes 4 (four) times daily. For 7-10 days 03/02/23   Smitty Cords, DO  VITAMIN D PO Take by mouth.    [provider]  Vitamin D, Ergocalciferol, (DRISDOL) 1.25 MG (50000 UNIT) CAPS capsule TAKE 1 CAPSULE BY MOUTH EVERY WEDNESDAY @9AM  06/27/22   Smitty Cords, DO    Physical Exam: Vitals:   03/25/23 1930 03/25/23 2000 03/25/23 2030 03/25/23 2100  BP: 117/76 109/69 107/69 108/74  Pulse: 96 92 91 92  Resp: 12 10 15 10   Temp:      TempSrc:      SpO2: 100% 100% 100% 100%   General: Not in acute distress HEENT:       Eyes: PERRL, EOMI, no jaundice       ENT: No discharge from the ears and nose, no pharynx injection, no tonsillar enlargement.        Neck: No JVD, no bruit, no mass felt. Heme: No neck lymph node enlargement. Cardiac: S1/S2, RRR, No murmurs, No gallops or rubs. Respiratory: No rales, wheezing, rhonchi or rubs. GI: Soft, nondistended, nontender, no rebound pain, no organomegaly, BS present. GU: No hematuria Ext: No pitting leg edema bilaterally.  Has erythema, tenderness, swelling, warmth, scabbed ulcer in right great toe   Musculoskeletal: No joint deformities, No joint redness  or warmth, no limitation of ROM in spin. Skin: No rashes.  Neuro: Alert, oriented X3, cranial nerves II-XII grossly intact, has chronic residual right-sided weakness from previous stroke  Psych: Patient is not psychotic, no suicidal or hemocidal ideation.  Labs on Admission: I have personally reviewed following labs and imaging studies  CBC: Recent Labs  Lab 03/25/23 1821  WBC 15.2*  NEUTROABS 11.1*  HGB 15.0  HCT 45.0  MCV 88.8  PLT 389   Basic Metabolic Panel: Recent Labs  Lab 03/25/23 1822  NA 137  K 3.5  CL 108  CO2 18*  GLUCOSE 146*  BUN 22*  CREATININE 0.70  CALCIUM 8.9   GFR: CrCl cannot be calculated (Unknown ideal weight.). Liver Function Tests: Recent Labs  Lab 03/25/23 1822  AST 15  ALT 11  ALKPHOS 54  BILITOT 0.7  PROT 6.8  ALBUMIN 3.3*   No results for input(s): "LIPASE", "AMYLASE" in the last 168 hours. No results for input(s): "AMMONIA" in the last 168 hours. Coagulation Profile: Recent Labs  Lab 03/25/23 1822  INR 1.1   Cardiac Enzymes: No results for input(s): "CKTOTAL", "CKMB", "CKMBINDEX", "TROPONINI" in the last 168 hours. BNP (last 3 results) No results for input(s): "PROBNP" in the last 8760 hours. HbA1C: No results for input(s): "HGBA1C" in the last 72 hours. CBG: No results for input(s): "GLUCAP" in the last 168 hours. Lipid Profile: No results for input(s): "CHOL", "HDL", "LDLCALC", "TRIG", "CHOLHDL", "LDLDIRECT" in the last 72 hours. Thyroid Function Tests: No results for input(s): "TSH", "T4TOTAL", "FREET4", "T3FREE", "THYROIDAB" in the last 72 hours. Anemia Panel: No results for input(s): "VITAMINB12", "FOLATE", "FERRITIN", "TIBC", "IRON", "RETICCTPCT" in the last 72 hours. Urine analysis:    Component Value Date/Time   COLORURINE YELLOW (A) 12/08/2021 2343   APPEARANCEUR Clear 09/08/2022 1446   LABSPEC >1.046 (H) 12/08/2021 2343   LABSPEC 1.030 09/23/2013 0154   PHURINE 5.0 12/08/2021 2343   GLUCOSEU Negative  09/08/2022 1446   GLUCOSEU >=500 09/23/2013 0154   HGBUR NEGATIVE 12/08/2021 2343   BILIRUBINUR Negative 09/08/2022 1446   BILIRUBINUR Negative 09/23/2013 0154   KETONESUR 80 (A) 12/08/2021 2343   PROTEINUR Negative 09/08/2022 1446   PROTEINUR 30 (A) 12/08/2021 2343   UROBILINOGEN 0.2 04/21/2022 1445   UROBILINOGEN 0.2 03/11/2012 1234   NITRITE Negative 09/08/2022 1446   NITRITE NEGATIVE 12/08/2021 2343   LEUKOCYTESUR Negative 09/08/2022 1446   LEUKOCYTESUR TRACE (A) 12/08/2021 2343   LEUKOCYTESUR Negative 09/23/2013 0154   Sepsis Labs: @LABRCNTIP (procalcitonin:4,lacticidven:4) )No results found for this or any previous visit (from the past 240 hours).   Radiological Exams on Admission:   Assessment/Plan Principal Problem:   Osteomyelitis of left foot (HCC) Active Problems:   PVD (peripheral vascular disease) (HCC)   Type 2 diabetes mellitus with peripheral vascular disease (HCC)   History of cerebrovascular accident (CVA) with residual deficit   HTN (hypertension)   Hyperlipidemia   COPD (chronic obstructive pulmonary disease) (HCC)   Tobacco abuse   Anxiety and depression   Obesity (BMI 30-39.9)   Assessment and Plan:  Osteomyelitis of left foot [facility]): Patient failed outpatient oral antibiotic treatment.  Patient has WBC 15.2, lactic acid of 2.0, reporting subjective fever, but her temperature is normal 98.2 in ED, clinically does not seem to have sepsis, however patient is at risk for developing sepsis now.  Consulted Dr. Gilda Crease of [facility], Dr and Annamary Rummage of podiatry.  - will admit to med-surg bed as inpatient - Empiric antimicrobial treatment with vancomycin and zosyn - PRN Zofran for nausea, and Dilaudid, Tylenol, Percocet for pain - Blood cultures x 2  - ESR and CRP - will trend lactic acid - IVF: 2L of LR of bolus  PVD (peripheral vascular disease) (HCC) -hold plavix for possible surgery -Continue Lipitor -f/u [facility]VS recommendations  Type 2 diabetes mellitus  with peripheral vascular disease (HCC): Recent A1c 5.2, patient taking metformin and Mounjaro at home -SSI  History of  cerebrovascular accident (CVA) with residual deficit -Lipitor -Hold Plavix for possible surgery  HTN (hypertension): Blood pressure 117/76.  Patient's not taking medications currently -IV hydralazine as needed  Hyperlipidemia -Lipid  COPD (chronic obstructive pulmonary disease) (HCC): -Bronchodilators and as needed Mucinex  Tobacco abuse -Nicotine patch  Anxiety and depression -Continue home medications: Wellbutrin, Celexa, Seroquel,  Obesity: Body weight while 4.3 kg, BMI 38.19 -Encourage losing weight -Exercise and healthy diet       DVT ppx: SQ Heparin      Code Status: Full code  Family Communication:     not done, no family member is at bed side.       Disposition Plan:  Anticipate discharge back to previous environment  Consults called:  Consulted Dr. Gilda Crease of [facility] and Dr. Annamary Rummage of podiatry.  Admission status and Level of care: Med-Surg:    for obs as inpt        Dispo: The patient is from: Home              Anticipated d/c is to: Home              Anticipated d/c date is: 2 days              Patient currently is not medically stable to d/c.    Severity of Illness:  The appropriate patient status for this patient is INPATIENT. Inpatient status is judged to be reasonable and necessary in order to provide the required intensity of service to ensure the patient's safety. The patient's presenting symptoms, physical exam findings, and initial radiographic and laboratory data in the context of their chronic comorbidities is felt to place them at high risk for further clinical deterioration. Furthermore, it is not anticipated that the patient will be medically stable for discharge from the hospital within 2 midnights of admission.   * I certify that at the point of admission it is my clinical judgment that the patient will require  inpatient hospital care spanning beyond 2 midnights from the point of admission due to high intensity of service, high risk for further deterioration and high frequency of surveillance required.*       Date of Service 03/25/2023    Lorretta Harp [facility]   If 7PM-7AM, please contact night-coverage [email or web address] 03/25/2023, 9:25 PM

## 2023-03-25 NOTE — ED Triage Notes (Signed)
 [facility] reports pt coming from  home. Pt states for the few weeks pt has been feeling bad. Pt has had a infected left great toe and was given oral antibiotics. Doctor called and advised her she needed to come to the ED for IV antibiotics. Pt uses hospital bed at home and motorized wheelchair, previous stroke with right side deficits.

## 2023-03-25 NOTE — ED Notes (Signed)
 At shift change hand off report, prior nurse notified this RN that 1 set of cultures was obtained prior to abx administration. 2nd set obtained after abx started.

## 2023-03-25 NOTE — ED Provider Notes (Signed)
 [facility] Provider Note    Event Date/Time   First MD Initiated Contact with Patient 03/25/23 1813     (approximate)   History   Chief Complaint Fever   HPI  [name] is a 58 y.o. female with past medical history of hypertension, hyperlipidemia, diabetes, COPD, stroke, and PAD who presents to the ED complaining of fever.  Patient reports that she was recently admitted to [facility] for wound to her left great toe, was eventually found to have osteomyelitis and was started on antibiotics.  She was discharged home on oral antibiotics with plan for outpatient follow-up with vascular surgery, however returns to the ED this evening due to fever and increased pain.  EMS reports fever of 100.6 and tachycardia, code sepsis activated prior to arrival.  Patient reports that she has been taking doxycycline and Cipro as prescribed, has had increased pain and swelling despite this.     Physical Exam   Triage Vital Signs: ED Triage Vitals  Encounter Vitals Group     BP 03/25/23 1818 112/89     Systolic BP Percentile --      Diastolic BP Percentile --      Pulse Rate 03/25/23 1818 (!) 112     Resp 03/25/23 1818 18     Temp 03/25/23 1818 98.2 F (36.8 C)     Temp Source 03/25/23 1818 Oral     SpO2 03/25/23 1817 98 %     Weight --      Height --      Head Circumference --      Peak Flow --      Pain Score 03/25/23 1819 9     Pain Loc --      Pain Education --      Exclude from Growth Chart --     Most recent vital signs: Vitals:   03/25/23 1910 03/25/23 1930  BP: 116/67 117/76  Pulse: 100 96  Resp:  12  Temp:    SpO2: 100% 100%    Constitutional: Alert and oriented. Eyes: Conjunctivae are normal. Head: Atraumatic. Nose: No congestion/rhinnorhea. Mouth/Throat: Mucous membranes are moist.  Cardiovascular: Normal rate, regular rhythm. Grossly normal heart sounds.  2+ radial pulses bilaterally.  Unable to palpate DP pulses bilaterally,  biphasic Doppler signals noted to bilateral DPs. Respiratory: Normal respiratory effort.  No retractions. Lungs CTAB. Gastrointestinal: Soft and nontender. No distention. Musculoskeletal: No lower extremity tenderness nor edema.  Ulceration noted to distal portion of left great toe with erythema, edema, and tenderness to entire great toe. Neurologic:  Normal speech and language. No gross focal neurologic deficits are appreciated.    ED Results / Procedures / Treatments   Labs (all labs ordered are listed, but only abnormal results are displayed) Labs Reviewed  CBC WITH DIFFERENTIAL/PLATELET - Abnormal; Notable for the following components:      Result Value   WBC 15.2 (*)    Neutro Abs 11.1 (*)    Abs Immature Granulocytes 0.09 (*)    All other components within normal limits  LACTIC ACID, PLASMA - Abnormal; Notable for the following components:   Lactic Acid, Venous 2.0 (*)    All other components within normal limits  COMPREHENSIVE METABOLIC PANEL - Abnormal; Notable for the following components:   CO2 18 (*)    Glucose, Bld 146 (*)    BUN 22 (*)    Albumin 3.3 (*)    All other components within normal limits  CULTURE,  BLOOD (ROUTINE X 2)  CULTURE, BLOOD (ROUTINE X 2)  PROTIME-INR  APTT  LACTIC ACID, PLASMA     EKG  ED ECG REPORT I, Chesley Noon, the attending physician, personally viewed and interpreted this ECG.   Date: 03/25/2023  EKG Time: 18:18  Rate: 111  Rhythm: sinus tachycardia  Axis: Normal  Intervals:none  ST&T Change: None  RADIOLOGY Left great toe x-ray reviewed and interpreted by me with bony cortical breakdown noted at the distal toe consistent with osteomyelitis.  PROCEDURES:  Critical Care performed: No  Procedures   MEDICATIONS ORDERED IN ED: Medications  piperacillin-tazobactam (ZOSYN) IVPB 3.375 g (3.375 g Intravenous New Bag/Given 03/25/23 1837)  vancomycin (VANCOCIN) IVPB 1000 mg/200 mL premix (1,000 mg Intravenous New Bag/Given  03/25/23 1959)  morphine (PF) 4 MG/ML injection 4 mg (4 mg Intravenous Given 03/25/23 1835)  lactated ringers bolus 1,000 mL (1,000 mLs Intravenous New Bag/Given 03/25/23 1839)  HYDROmorphone (DILAUDID) injection 0.5 mg (0.5 mg Intravenous Given 03/25/23 1916)     IMPRESSION / MDM / ASSESSMENT AND PLAN / ED COURSE  I reviewed the triage vital signs and the nursing notes.                              58 y.o. female with past medical history of hypertension, hyperlipidemia, diabetes, COPD, stroke, and PAD who presents to the ED complaining of increasing pain and swelling to her left great toe over the past couple of days despite taking antibiotics.  Patient's presentation is most consistent with acute presentation with potential threat to life or bodily function.  Differential diagnosis includes, but is not limited to, sepsis, osteomyelitis, abscess, arterial insufficiency.  Patient nontoxic-appearing and in no acute distress, vital signs remarkable for tachycardia but otherwise reassuring.  She is afebrile in the ED, does state that she took some aspirin a couple hours prior to arrival.  She has signs of worsening infection to her left great toe, recent MRI from [facility] was reviewed and consistent with osteomyelitis.  Labs today show leukocytosis with mild lactic acidosis, no significant anemia, electrolyte abnormality, or AKI.  LFTs are unremarkable.  I am unable to palpate DP pulses bilaterally but was able to obtain biphasic Doppler signals.  We will treat pain, start on IV hospitalist for admission.      FINAL CLINICAL IMPRESSION(S) / ED DIAGNOSES   Final diagnoses:  Sepsis without acute organ dysfunction, due to unspecified organism [facility])  Acute osteomyelitis of toe of left foot (HCC)     Rx / DC Orders   ED Discharge Orders     None        Note:  This document was prepared using Dragon voice recognition software and may include unintentional dictation errors.   Chesley Noon, MD 03/25/23 2010

## 2023-03-25 NOTE — Consult Note (Signed)
 Pharmacy Antibiotic Note  [name] is a 58 y.o. female admitted on 03/25/2023 with  Left foot osteomyelitis .  Pharmacy has been consulted for pip/tazo and vancomycin dosing. Imaging of foot pending. Afeb, WBC 15, pt was complaining of a fever per notes. She has a wound on left great toe, at [facility] found to have OM and was started on abx. She was sent home on doxy + cipro. Cannot find any culture data for OM.   Plan: Start pip/tazo 3.375 g q8h EI   Pt received vancomycin 1000 mg x 1 in the ED. Will an additional vancomycin 1000 mg now for a total of 2000 mg loading dose. Followed by 1000 mg q12H. Predicted AUC of 479. Goal AUC of 400-600. Plan to obtain vancomycin level after 4th or 5th dose. Vd 0.72, Scr 0.8, IBW.   No weight in chart so I used previous weight listed in the chart, 95 kg from the end of February.      Temp (24hrs), Avg:98.2 F (36.8 C), Min:98.2 F (36.8 C), Max:98.2 F (36.8 C)  Recent Labs  Lab 03/25/23 1821 03/25/23 1822 03/25/23 1956  WBC 15.2*  --   --   CREATININE  --  0.70  --   LATICACIDVEN 2.0*  --  1.5    CrCl cannot be calculated (Unknown ideal weight.).    Allergies  Allergen Reactions   Tramadol Nausea And Vomiting and Hypertension   Augmentin [Amoxicillin-Pot Clavulanate] Rash    Antimicrobials this admission: 3/12 pip/tazo >>  3/12 vancomycin >>   Dose adjustments this admission: None  Microbiology results: 3/12 BCx: pending   Thank you for allowing pharmacy to be a part of this patient's care.  Ronnald Ramp, PharmD, BCPS 03/25/2023 8:35 PM

## 2023-03-26 ENCOUNTER — Encounter: Payer: Self-pay | Admitting: Anesthesiology

## 2023-03-26 DIAGNOSIS — M86172 Other acute osteomyelitis, left ankle and foot: Secondary | ICD-10-CM

## 2023-03-26 DIAGNOSIS — M86472 Chronic osteomyelitis with draining sinus, left ankle and foot: Secondary | ICD-10-CM | POA: Diagnosis not present

## 2023-03-26 LAB — BASIC METABOLIC PANEL
Anion gap: 7 (ref 5–15)
BUN: 17 mg/dL (ref 6–20)
CO2: 19 mmol/L — ABNORMAL LOW (ref 22–32)
Calcium: 8.5 mg/dL — ABNORMAL LOW (ref 8.9–10.3)
Chloride: 109 mmol/L (ref 98–111)
Creatinine, Ser: 0.62 mg/dL (ref 0.44–1.00)
GFR, Estimated: >60 mL/min (ref >60)
Glucose, Bld: 107 mg/dL — ABNORMAL HIGH (ref 70–99)
Potassium: 3.6 mmol/L (ref 3.5–5.1)
Sodium: 135 mmol/L (ref 135–145)

## 2023-03-26 LAB — C-REACTIVE PROTEIN: CRP: 0.6 mg/dL (ref <1.0)

## 2023-03-26 LAB — GLUCOSE, CAPILLARY
Glucose-Capillary: 97 mg/dL (ref 70–99)
Glucose-Capillary: 156 mg/dL — ABNORMAL HIGH (ref 70–99)

## 2023-03-26 LAB — HIV ANTIBODY (ROUTINE TESTING W REFLEX): HIV Screen 4th Generation wRfx: NONREACTIVE

## 2023-03-26 LAB — CBC
HCT: 38.2 % (ref 36.0–46.0)
Hemoglobin: 12.6 g/dL (ref 12.0–15.0)
MCH: 29.4 pg (ref 26.0–34.0)
MCHC: 33 g/dL (ref 30.0–36.0)
MCV: 89.3 fL (ref 80.0–100.0)
Platelets: 261 10*3/uL (ref 150–400)
RBC: 4.28 MIL/uL (ref 3.87–5.11)
RDW: 13.6 % (ref 11.5–15.5)
WBC: 11.7 10*3/uL — ABNORMAL HIGH (ref 4.0–10.5)
nRBC: 0 % (ref 0.0–0.2)

## 2023-03-26 LAB — CBG MONITORING, ED
Glucose-Capillary: 98 mg/dL (ref 70–99)
Glucose-Capillary: 146 mg/dL — ABNORMAL HIGH (ref 70–99)

## 2023-03-26 MED ORDER — FAMOTIDINE 20 MG PO TABS: 40.0000 mg | ORAL_TABLET | Freq: Once | ORAL | Status: DC | PRN

## 2023-03-26 MED ORDER — MIDODRINE HCL 5 MG PO TABS
5.0000 mg | ORAL_TABLET | Freq: Three times a day (TID) | ORAL | Status: DC
  Administered 2023-03-26 – 2023-04-04 (×25): 5 mg via ORAL
  Filled 2023-03-26 (×25): qty 1

## 2023-03-26 MED ORDER — HYDROMORPHONE HCL 1 MG/ML IJ SOLN: 0.5000 mg | INTRAMUSCULAR | Status: DC | PRN

## 2023-03-26 MED ORDER — SODIUM CHLORIDE 0.9 % IV SOLN: INTRAVENOUS | Status: DC

## 2023-03-26 MED ORDER — DIPHENHYDRAMINE HCL 50 MG/ML IJ SOLN: 50.0000 mg | Freq: Once | INTRAMUSCULAR | Status: DC | PRN

## 2023-03-26 MED ORDER — MIDAZOLAM HCL 2 MG/ML PO SYRP: 8.0000 mg | ORAL_SOLUTION | Freq: Once | ORAL | Status: DC | PRN

## 2023-03-26 MED ORDER — FENTANYL CITRATE PF 50 MCG/ML IJ SOSY: 12.5000 ug | PREFILLED_SYRINGE | Freq: Once | INTRAMUSCULAR | Status: DC | PRN

## 2023-03-26 MED ORDER — METHYLPREDNISOLONE SODIUM SUCC 125 MG IJ SOLR: 125.0000 mg | Freq: Once | INTRAMUSCULAR | Status: DC | PRN

## 2023-03-26 MED ORDER — SODIUM CHLORIDE 0.9 % IV SOLN: INTRAVENOUS | Status: AC

## 2023-03-26 MED ORDER — OXYCODONE-ACETAMINOPHEN 5-325 MG PO TABS
1.0000 | ORAL_TABLET | Freq: Four times a day (QID) | ORAL | Status: DC | PRN
  Administered 2023-03-26 – 2023-03-27 (×3): 1 via ORAL
  Filled 2023-03-26 (×3): qty 1

## 2023-03-26 MED ORDER — SODIUM CHLORIDE 0.9 % IV BOLUS
500.0000 mL | Freq: Once | INTRAVENOUS | Status: AC
  Administered 2023-03-26: 500 mL via INTRAVENOUS

## 2023-03-26 NOTE — Progress Notes (Signed)
 Progress Note   Patient: [name] NWG:[id number] DOB: 08/15/1965 DOA: 03/25/2023     1 DOS: the patient was seen and examined on 03/26/2023   Brief hospital course:  [name] is a 58 y.o. female with medical history significant of osteomyelitis of left great toe, HTN, HLD, DM, COPD, P:VD, stroke with right-sided weakness,, depression with anxiety, migraine, kidney stone, diverticulitis, tobacco abuse, who presents with left foot pain.   Patient was recently hospitalized to [facility] due to left great toe osteomyelitis. She was discharged home on oral antibiotics of doxycycline and Cipro, with plan for outpatient follow-up with vascular surgery. She has finished a course of antibiotics today, however she has worsening left great toe pain, which is constant, sharp, severe, nonradiating.  Left great toe is erythematous and swelling.  She also developed chills and fever of 100.6 today.  No nausea, vomiting, diarrhea or abdominal pain.  Patient is constipated.  No cough, SOB and chest pain. No symptoms of UTI. It is unable to palpate DP pulses bilaterally,  but was able to obtain biphasic Doppler signals per EDP, Dr, Larinda Buttery.   MRI-left lower extremity on 03/12/23: Distal great toe soft tissue ulcer with active osteomyelitis of the great toe distal phalanx. Abnormal signal involves the entire distal phalanx.  No evidence for soft tissue abscess.    Data reviewed independently and ED Course: pt was found to have WBC 15.2, lactic acid 2.0, GFR> 60, INR 1.1, PTT 28.  Temperature 98.2, blood pressure 117/76, heart rate 112, 96, RR 18, oxygen saturation 100% on room air.  Patient is admitted to MedSurg bed as inpatient. Consulted Dr. Gilda Crease of VVS and Dr. Annamary Rummage of podiatry.   X-ray of left great toe: Ulcer or wound at the tip of the great toe with osseous destructive change involving tuft of first distal phalanx consistent with osteomyelitis.     EKG: I have personally reviewed.   Sinus rhythm, QTc 443, low voltage, borderline left axis deviation     Assessment and Plan:  Osteomyelitis of left foot [facility]):  Known peripheral arterial disease Patient failed outpatient oral antibiotic treatment and recently completed a course of doxycycline and ciprofloxacin.   Patient had WBC 15.2, lactic acid of 2.0, reporting subjective fever, but her temperature was normal in ED ( 98.2)  Continue empiric antibiotic therapy with vancomycin and Zosyn Podiatry has been consulted with plans to amputate the left great toe most likely on 03/17 following patient's vascular surgery procedure which will be done on 3 03/27/23 Pain control Plavix was placed on hold for possible procedure Continue atorvastatin    Type 2 diabetes mellitus with peripheral vascular disease (HCC):  Recent A1c 5.2 Hold metformin and Mounjaro at home Continue sliding scale insulin Maintain consistent carbohydrate diet    History of cerebrovascular accident (CVA) with residual deficit -Lipitor -Hold Plavix for possible surgery    HTN (hypertension):  Patient with relative hypotension but asymptomatic Will start patient on midodrine 5 mg 3 times daily      COPD (chronic obstructive pulmonary disease) (HCC): Not acutely exacerbated -Bronchodilators and as needed Mucinex    Tobacco abuse -Nicotine patch    Anxiety and depression -Continue Wellbutrin, Celexa, Seroquel,    Obesity: Class II BMI 38.19 Lifestyle modification and exercise has been discussed with patient in detail          Subjective: Complains of pain in her left great toe  Physical Exam: Vitals:   03/26/23 1230 03/26/23 1300 03/26/23 1301  03/26/23 1400  BP: 99/61 96/83  95/60  Pulse: 82 88  86  Resp: 16 16  16   Temp:   98.5 F (36.9 C)   TempSrc:   Oral   SpO2: 96% 99%  99%  Weight:      Height:       General: Not in acute distress HEENT:       Eyes: PERRL, EOMI, no jaundice       ENT: No discharge from the  ears and nose, no pharynx injection, no tonsillar enlargement.        Neck: No JVD, no bruit, no mass felt. Heme: No neck lymph node enlargement. Cardiac: S1/S2, RRR, No murmurs, No gallops or rubs. Respiratory: No rales, wheezing, rhonchi or rubs. GI: Soft, nondistended, nontender, no rebound pain, no organomegaly, BS present. GU: No hematuria Ext: No pitting leg edema bilaterally.  Has erythema, tenderness, swelling, warmth, scabbed ulcer in right great toe    Musculoskeletal: No joint deformities, No joint redness or warmth, no limitation of ROM in spin. Skin: No rashes.  Neuro: Alert, oriented X3, cranial nerves II-XII grossly intact, has chronic residual right-sided weakness from previous stroke   Psych: Patient is not psychotic, no suicidal or hemocidal ideation.       Data Reviewed: Labs reviewed.  White count 11.7 down from 15 There are no new results to review at this time.  Family Communication: Plan of care was discussed with patient in detail.  She verbalizes understanding and agrees to the plan.  Disposition: Status is: Inpatient Discharge Destination: Home    Time spent: 36 minutes  Author: Lucile Shutters, MD 03/26/2023 2:36 PM  For on call review [email or web address].

## 2023-03-26 NOTE — H&P (View-Only) (Signed)
 Hospital Consult    Reason for Consult:  Left Great Toe Pain and Swelling  Requesting Physician:  Dr Lorretta Harp MD  MRN #:  [id number]  History of Present Illness: [name]  with past medical history of hypertension, hyperlipidemia, diabetes, COPD, stroke, and PAD who presents to the ED complaining of fever.  Patient reports that she was recently admitted to [facility] for wound to her left great toe, was eventually found to have osteomyelitis and was started on antibiotics.  She was discharged home on oral antibiotics with plan for outpatient follow-up with vascular surgery, however returns to the ED this evening due to fever and increased pain.  EMS reports fever of 100.6 and tachycardia, code sepsis activated prior to arrival.  Patient reports that she has been taking doxycycline and Cipro as prescribed, has had increased pain and swelling despite this. Vascular Surgery Consulted to evaluate.   Past Medical History:  Diagnosis Date   Anxiety    Cancer (HCC)    a spot on liver and treated    Complication of anesthesia    restless,easily upset   COPD (chronic obstructive pulmonary disease) (HCC)    Depression    Diabetes mellitus without complication (HCC)    Diverticulitis    Fatty liver    GERD (gastroesophageal reflux disease)    Headache(784.0)    migraines   History of kidney stones    Hyperlipidemia    Hypertension    PAD (peripheral artery disease) (HCC)    Pneumonia    Restless    Stroke [facility])     Past Surgical History:  Procedure Laterality Date   ABDOMINAL HYSTERECTOMY     ACHILLES TENDON LENGTHENING Right 08/18/2017   Procedure: ACHILLES TENDON LENGTHENING;  Surgeon: Myrene Galas, MD;  Location: [facility] OR;  Service: Orthopedics;  Laterality: Right;   ANTERIOR CERVICAL DECOMP/DISCECTOMY FUSION N/A 03/11/2012   Procedure: ANTERIOR CERVICAL DECOMPRESSION/DISCECTOMY FUSION 1 LEVEL;  Surgeon: Cristi Loron, MD;  Location: [facility] NEURO ORS;  Service:  Neurosurgery;  Laterality: N/A;  Cervical five-six anterior cervical decompression with fusion interbody prothesis plating and bonegraft   BACK SURGERY     EXTERNAL FIXATION LEG Right 08/10/2017   Procedure: EXTERNAL FIXATION ANKLE;  Surgeon: Donato Heinz, MD;  Location: [facility] ORS;  Service: Orthopedics;  Laterality: Right;   EXTERNAL FIXATION REMOVAL Right 08/18/2017   Procedure: REMOVAL EXTERNAL FIXATION LEG;  Surgeon: Myrene Galas, MD;  Location: [facility] OR;  Service: Orthopedics;  Laterality: Right;   EYE SURGERY     HERNIA REPAIR     IR FL GUIDED LOC OF NEEDLE/CATH TIP FOR SPINAL INJECTION LT  12/09/2021   IR FL GUIDED LOC OF NEEDLE/CATH TIP FOR SPINAL INJECTION RT  12/09/2021   JOINT REPLACEMENT     bil knees   ORIF ANKLE FRACTURE Right 08/18/2017   Procedure: OPEN REDUCTION INTERNAL FIXATION (ORIF) ANKLE FRACTURE;  Surgeon: Myrene Galas, MD;  Location: [facility] OR;  Service: Orthopedics;  Laterality: Right;   REPLACEMENT TOTAL KNEE BILATERAL      Allergies  Allergen Reactions   Tramadol Nausea And Vomiting and Hypertension   Augmentin [Amoxicillin-Pot Clavulanate] Rash    Prior to Admission medications   Medication Sig Start Date End Date Taking? Authorizing Provider  albuterol (VENTOLIN HFA) 108 (90 Base) MCG/ACT inhaler INHALE 2 INHALATIONS BY MOUTH  EVERY 4 HOURS AS NEEDED FOR  WHEEZING OR FOR SHORTNESS OF  BREATH 12/30/22  Yes Karamalegos, Netta Neat, DO  atorvastatin (LIPITOR)  40 MG tablet TAKE 1 TABLET BY MOUTH AT  BEDTIME 01/08/23  Yes Karamalegos, Netta Neat, DO  buPROPion (WELLBUTRIN XL) 150 MG 24 hr tablet Take 1 tablet (150 mg total) by mouth daily. 12/25/22  Yes Karamalegos, Netta Neat, DO  citalopram (CELEXA) 40 MG tablet TAKE 1 TABLET BY MOUTH DAILY 03/10/23  Yes Karamalegos, Netta Neat, DO  clopidogrel (PLAVIX) 75 MG tablet TAKE ONE TABLET BY MOUTH DAILY AT 9AM 06/27/22  Yes Karamalegos, Netta Neat, DO  cyclobenzaprine (FLEXERIL) 10 MG tablet Take 10 mg by mouth 3 (three)  times daily.   Yes [provider]  fluticasone (FLONASE) 50 MCG/ACT nasal spray Place 2 sprays into both nostrils daily. Use for 4-6 weeks then stop and use seasonally or as needed. 03/02/23  Yes Karamalegos, Netta Neat, DO  gabapentin (NEURONTIN) 600 MG tablet TAKE 1 TABLET BY MOUTH 4 TIMES  DAILY 09/23/22  Yes Smitty Cords, DO  MOUNJARO 10 MG/0.5ML Pen INJECT SUBCUTANEOUSLY 10 MG ONCE WEEKLY 09/23/22  Yes Karamalegos, Netta Neat, DO  mupirocin ointment (BACTROBAN) 2 % Apply 1 Application topically 2 (two) times daily. For 10 days 03/09/23  Yes Karamalegos, Netta Neat, DO  nystatin (MYCOSTATIN/NYSTOP) powder APPLY 1 APPLICATION TOPICALLY THREE TIMES DAILY AS NEEDED FOR YEAST RASH INTERTRIGO 06/27/22  Yes Karamalegos, Netta Neat, DO  ondansetron (ZOFRAN-ODT) 4 MG disintegrating tablet Take 1 tablet (4 mg total) by mouth every 8 (eight) hours as needed for nausea or vomiting. 02/05/23  Yes Karamalegos, Netta Neat, DO  oxyCODONE-acetaminophen (PERCOCET) 10-325 MG tablet Take 1 tablet by mouth every 8 (eight) hours as needed for pain. 05/06/22  Yes Karamalegos, Netta Neat, DO  pantoprazole (PROTONIX) 40 MG tablet TAKE 1 TABLET BY MOUTH DAILY AT  9 AM 02/03/23  Yes Karamalegos, Alexander J, DO  QUEtiapine (SEROQUEL) 100 MG tablet TAKE 1 TABLET BY MOUTH DAILY AT  9 PM AT BEDTIME 10/07/22  Yes Karamalegos, Alexander J, DO  topiramate (TOPAMAX) 100 MG tablet TAKE 1 TABLET BY MOUTH TWICE  DAILY 10/07/22  Yes Karamalegos, Alexander J, DO  TRELEGY ELLIPTA 100-62.5-25 MCG/ACT AEPB USE 1 INHALATION BY MOUTH ONCE  DAILY AT THE SAME TIME EACH DAY 02/17/23  Yes Karamalegos, Netta Neat, DO  trimethoprim-polymyxin b (POLYTRIM) ophthalmic solution Place 1 drop into both eyes 4 (four) times daily. For 7-10 days 03/02/23  Yes Karamalegos, Netta Neat, DO  VITAMIN D PO Take by mouth.   Yes [provider]  Vitamin D, Ergocalciferol, (DRISDOL) 1.25 MG (50000 UNIT) CAPS capsule TAKE 1 CAPSULE BY  MOUTH EVERY WEDNESDAY @9AM  06/27/22  Yes Karamalegos, Netta Neat, DO  azelastine (ASTELIN) 0.1 % nasal spray Place 2 sprays into both nostrils 2 (two) times daily. Patient not taking: Reported on 03/25/2023 10/25/21   Smitty Cords, DO  fluconazole (DIFLUCAN) 150 MG tablet Take one tablet by mouth on Day 1. Repeat dose 2nd tablet on Day 3. 03/02/23   Althea Charon, Netta Neat, DO  levofloxacin (LEVAQUIN) 500 MG tablet Take 1 tablet (500 mg total) by mouth daily. For 7 days 03/02/23   Smitty Cords, DO  metFORMIN (GLUCOPHAGE) 1000 MG tablet TAKE 1 TABLET BY MOUTH TWICE  DAILY WITH MEALS Patient not taking: Reported on 03/02/2023 09/23/22   Smitty Cords, DO  midodrine (PROAMATINE) 5 MG tablet Take 1 tablet (5 mg total) by mouth 3 (three) times daily with meals. Patient not taking: Reported on 03/02/2023 10/20/22   Debbe Odea, MD    Social History   Socioeconomic History  Marital status: Married    Spouse name: Not on file   Number of children: Not on file   Years of education: Not on file   Highest education level: 10th grade  Occupational History   Not on file  Tobacco Use   Smoking status: Every Day    Current packs/day: 1.00    Average packs/day: 1 pack/day for 20.0 years (20.0 ttl pk-yrs)    Types: Cigarettes   Smokeless tobacco: Never  Vaping Use   Vaping status: Never Used  Substance and Sexual Activity   Alcohol use: No   Drug use: Yes    Types: Marijuana, Oxycodone    Comment: last smoked 2 days ago  8/4   Sexual activity: Not on file  Other Topics Concern   Not on file  Social History Narrative   Not on file   Social Drivers of Health   Financial Resource Strain: Low Risk  (11/19/2022)   Overall Financial Resource Strain (CARDIA)    Difficulty of Paying Living Expenses: Not very hard  Food Insecurity: Food Insecurity Present (11/19/2022)   Hunger Vital Sign    Worried About Running Out of Food in the Last Year: Sometimes true     Ran Out of Food in the Last Year: Patient declined  Transportation Needs: No Transportation Needs (11/19/2022)   PRAPARE - Administrator, Civil Service (Medical): No    Lack of Transportation (Non-Medical): No  Physical Activity: Inactive (11/19/2022)   Exercise Vital Sign    Days of Exercise per Week: 0 days    Minutes of Exercise per Session: 0 min  Stress: Stress Concern Present (11/19/2022)   [employer] of Occupational Health - Occupational Stress Questionnaire    Feeling of Stress : To some extent  Social Connections: Moderately Isolated (11/19/2022)   Social Connection and Isolation Panel [NHANES]    Frequency of Communication with Friends and Family: More than three times a week    Frequency of Social Gatherings with Friends and Family: Patient declined    Attends Religious Services: Never    Database administrator or Organizations: No    Attends Banker Meetings: Never    Marital Status: Married  Catering manager Violence: Unknown (05/26/2022)   Received from [facility]   HITS    Physically Hurt: Not on file    Insult or Talk Down To: Not on file    Threaten Physical Harm: Not on file    Scream or Curse: Not on file     Family History  Problem Relation Age of Onset   Diabetes Mother    Hypertension Mother    Hypertension Father     ROS: Otherwise negative unless mentioned in HPI  Physical Examination  Vitals:   03/26/23 0447 03/26/23 0712  BP: (!) 89/65 (!) 85/54  Pulse: 81 81  Resp: 18 16  Temp: (!) 97.2 F (36.2 C)   SpO2: 97% 96%   Body mass index is 34.86 kg/m.  General:  WDWN in NAD Gait: Not observed HENT: WNL, normocephalic Pulmonary: normal non-labored breathing, without Rales, rhonchi,  wheezing Cardiac: regular, without  Murmurs, rubs or gallops; without carotid bruits Abdomen: Positive bowel sounds throughout, soft, NT/ND, no masses Positive constipation  Skin: without rashes Vascular Exam/Pulses: Palpable  pulses throughout except unable to palpate or doppler left Dorsalis pedis pulse.  Extremities: without ischemic changes, with Gangrene , with cellulitis; without open wounds;  Musculoskeletal: no muscle wasting or atroph Positive Morbid Obesity  Neurologic: A&O X 3;  Positive right sided weakness are detected; speech is fluent/normal Psychiatric:  The pt has Normal affect. Lymph:  Unremarkable  CBC    Component Value Date/Time   WBC 11.7 (H) 03/26/2023 0632   RBC 4.28 03/26/2023 0632   HGB 12.6 03/26/2023 0632   HGB 14.6 09/22/2013 2251   HCT 38.2 03/26/2023 0632   HCT 44.3 09/22/2013 2251   PLT 261 03/26/2023 0632   PLT 300 09/22/2013 2251   MCV 89.3 03/26/2023 0632   MCV 90 09/22/2013 2251   MCH 29.4 03/26/2023 0632   MCHC 33.0 03/26/2023 0632   RDW 13.6 03/26/2023 0632   RDW 13.8 09/22/2013 2251   LYMPHSABS 3.0 03/25/2023 1821   LYMPHSABS 4.5 (H) 08/02/2013 2118   MONOABS 0.9 03/25/2023 1821   MONOABS 1.3 (H) 08/02/2013 2118   EOSABS 0.0 03/25/2023 1821   EOSABS 0.1 08/02/2013 2118   BASOSABS 0.0 03/25/2023 1821   BASOSABS 0.0 08/02/2013 2118    BMET    Component Value Date/Time   NA 135 03/26/2023 0632   NA 135 (L) 09/22/2013 2251   K 3.6 03/26/2023 0632   K 3.8 09/22/2013 2251   CL 109 03/26/2023 0632   CL 100 09/22/2013 2251   CO2 19 (L) 03/26/2023 0632   CO2 27 09/22/2013 2251   GLUCOSE 107 (H) 03/26/2023 0632   GLUCOSE 335 (H) 09/22/2013 2251   BUN 17 03/26/2023 0632   BUN 11 09/22/2013 2251   CREATININE 0.62 03/26/2023 0632   CREATININE 0.62 05/15/2021 1351   CALCIUM 8.5 (L) 03/26/2023 0632   CALCIUM 8.3 (L) 09/22/2013 2251   GFRNONAA >60 03/26/2023 0632   GFRNONAA >60 09/22/2013 2251   GFRAA >60 08/18/2017 0707   GFRAA >60 09/22/2013 2251    COAGS: Lab Results  Component Value Date   INR 1.1 03/25/2023   INR 1.3 (H) 12/09/2021   INR 1.04 08/18/2017     Non-Invasive Vascular Imaging:   EXAM:03/25/23 LEFT GREAT TOE   COMPARISON:  None  Available.   FINDINGS: Ulcer or wound at the tip of the great toe. Osseous destructive change involving tuft of first distal phalanx. Mild degenerative change at the first MTP joint. No soft tissue emphysema   IMPRESSION: Ulcer or wound at the tip of the great toe with osseous destructive change involving tuft of first distal phalanx consistent with osteomyelitis.  LOWER EXTREMITY DOPPLER STUDY   Patient Name:  [name]  Date of Exam:   08/27/2022  Medical Rec #: [id number]       Accession #:    [id number]  Date of Birth: December 07, 1965        Patient Gender: F  Patient Age:   15 years  Exam Location:  [facility]  Procedure:      VAS [place] ABI WITH/WO TBI  Referring Phys: Lemar Livings    ---------------------------------------------------------------------------  -----    Indications: Peripheral artery disease.   High Risk Factors: Hypertension, hyperlipidemia, Diabetes, current smoker,  prior                    CVA.   Other Factors: Hx Liver Ca.   Limitations: Today's exam was limited due to patient unable to lie flat,  patient              positioning and involuntary patient movement.   Comparison Study: 08/20/22 CTA Ao/Bifem  06/04/22 ABI   Performing Technologist: Lowell Guitar RVT, RDMS     Examination Guidelines: A complete evaluation includes at minimum, Doppler  waveform signals and systolic blood pressure reading at the level of  bilateral  brachial, anterior tibial, and posterior tibial arteries, when vessel  segments  are accessible. Bilateral testing is considered an integral part of a  complete  examination. Photoelectric Plethysmograph (PPG) waveforms and toe systolic  pressure readings are included as required and additional duplex testing  as  needed. Limited examinations for reoccurring indications may be performed  as  noted.     ABI Findings:  +---------+------------------+-----+-----------+--------+  Right    Rt Pressure (mmHg)IndexWaveform   Comment   +---------+------------------+-----+-----------+--------+  Brachial 121                                         +---------+------------------+-----+-----------+--------+  PTA     72                0.60 monophasic           +---------+------------------+-----+-----------+--------+  DP      90                0.74 multiphasic          +---------+------------------+-----+-----------+--------+  Great Toe57                0.47 Abnormal             +---------+------------------+-----+-----------+--------+   +---------+----------------+-----+----------+------------------------------  ----+  Left    Lt Pressure     IndexWaveform  Comment                                       (mmHg)                                                              +---------+----------------+-----+----------+------------------------------  ----+  Brachial 118                                                                 +---------+----------------+-----+----------+------------------------------  ----+  PTA     120             0.99 monophasic                                     +---------+----------------+-----+----------+------------------------------  ----+  DP      115             0.95 monophasic                                     +---------+----------------+-----+----------+------------------------------  ----+  Great Toe71              0.59 Normal  difficult due to involuntary                                                 movement                             +---------+----------------+-----+----------+------------------------------  ----+   +-------+-----------+-----------+------------+------------+  ABI/TBIToday's ABIToday's TBIPrevious ABIPrevious TBI  +-------+-----------+-----------+------------+------------+  Right 0.74       0.47       0.70        0.36           +-------+-----------+-----------+------------+------------+  Left  0.99       0.59       0.83        0.70          +-------+-----------+-----------+------------+------------+        Arterial wall calcification precludes accurate ankle pressures and ABIs.  Right ABIs and TBIs appear essentially unchanged. Left ABIs appear  increased. Left TBI is decreased    Summary:  Right: Resting right ankle-brachial index indicates moderate right lower  extremity arterial disease. The right toe-brachial index is abnormal. PPG  tracings appear dampened.   Left: Resting left ankle-brachial index is within normal range. The left  toe-brachial index is abnormal.    *See table(s) above for measurements and observations.      Electronically signed by Lemar Livings MD on 08/28/2022 at 11:57:10 AM.       Statin:  Yes.   Beta Blocker:  No. Aspirin:  No. ACEI:  No. ARB:  No. CCB use:  No Other antiplatelets/anticoagulants:  Yes.   Plavix 75 mg Daily   ASSESSMENT/PLAN: [name] with a previous history of stroke and PAD who presents to [facility] emergency department complaining of fever.  She was recently admitted to [facility] for the wound on her left great toe was eventually found to have osteomyelitis and started on antibiotics.  She endorses her toe has been this way for 2 weeks and is not healing but getting worse.  PLAN Vascular surgery plans on taking the patient to the vascular lab on Friday, 03/27/2023 for left lower extremity angiogram with possible intervention.-This morning at the bedside in the emergency department patient is resting comfortably.  I had a long detailed discussion with the patient regarding the procedure, benefits, risks, and complications.  She verbalized her understanding wishes to proceed.  I answered all her questions this morning.  Patient will be made n.p.o. after midnight tonight for procedure tomorrow.  I discussed the case in detail with  Dr. Levora Dredge MD and he agrees with the plan.   Marcie Bal [facility] 03/26/2023 7:50 AM

## 2023-03-26 NOTE — Consult Note (Signed)
  Subjective:  Patient ID: [name], female    DOB: March 30, 1965,  MRN: [id number]  A 58 y.o. female medical history significant of osteomyelitis of left great toe, HTN, HLD, DM, COPD, P:VD, stroke with right-sided weakness,, depression with anxiety, migraine, kidney stone, diverticulitis, tobacco abuse, who presents with left great toe osteomyelitis.  She states that she was recently do hospitalized at [facility].  She was given outpatient antibiotics for which she failed.  She started getting fevers and chills and came to the emergency room.  Objective:   Vitals:   03/26/23 0730 03/26/23 0800  BP: 100/64 (!) 82/54  Pulse: 82 78  Resp: 18 18  Temp:    SpO2: 99% 97%   General AA&O x3. Normal mood and affect.  Vascular Dorsalis pedis and posterior tibial pulses 2/4 bilat. Brisk capillary refill to all digits. Pedal hair present.  Neurologic Epicritic sensation grossly intact.  Dermatologic Left great toe distal ulceration.  Redness noted up to the MPJ.  Noted no purulent drainage noted able to probe the bone consistent with osteomyelitis.  Orthopedic: MMT 5/5 in dorsiflexion, plantarflexion, inversion, and eversion. Normal joint ROM without pain or crepitus.       Assessment & Plan:  Patient was evaluated and treated and all questions answered.  Left great toe osteomyelitis -All questions and concerns were discussed with the patient in extensive detail -Patient will benefit from vascular consult as patient has history of vascular intervention. -Patient will need an amputation in the near future.  We will plan on scheduling the patient for left great toe amputation tomorrow pending vascular plans otherwise we will plan on doing it on Monday.  I discussed this case with Dr. Annamary Rummage will be performing surgery -Weightbearing as tolerated in surgical shoe -Betadine wet-to-dry dressing -IV antibiotics  Candelaria Stagers, DPM  Accessible via secure chat for questions or  concerns.

## 2023-03-26 NOTE — Consult Note (Signed)
 Hospital Consult    Reason for Consult:  Left Great Toe Pain and Swelling  Requesting Physician:  Dr Lorretta Harp MD  MRN #:  [id number]  History of Present Illness: [name]  with past medical history of hypertension, hyperlipidemia, diabetes, COPD, stroke, and PAD who presents to the ED complaining of fever.  Patient reports that she was recently admitted to [facility] for wound to her left great toe, was eventually found to have osteomyelitis and was started on antibiotics.  She was discharged home on oral antibiotics with plan for outpatient follow-up with vascular surgery, however returns to the ED this evening due to fever and increased pain.  EMS reports fever of 100.6 and tachycardia, code sepsis activated prior to arrival.  Patient reports that she has been taking doxycycline and Cipro as prescribed, has had increased pain and swelling despite this. Vascular Surgery Consulted to evaluate.   Past Medical History:  Diagnosis Date   Anxiety    Cancer (HCC)    a spot on liver and treated    Complication of anesthesia    restless,easily upset   COPD (chronic obstructive pulmonary disease) (HCC)    Depression    Diabetes mellitus without complication (HCC)    Diverticulitis    Fatty liver    GERD (gastroesophageal reflux disease)    Headache(784.0)    migraines   History of kidney stones    Hyperlipidemia    Hypertension    PAD (peripheral artery disease) (HCC)    Pneumonia    Restless    Stroke [facility])     Past Surgical History:  Procedure Laterality Date   ABDOMINAL HYSTERECTOMY     ACHILLES TENDON LENGTHENING Right 08/18/2017   Procedure: ACHILLES TENDON LENGTHENING;  Surgeon: Myrene Galas, MD;  Location: [facility] OR;  Service: Orthopedics;  Laterality: Right;   ANTERIOR CERVICAL DECOMP/DISCECTOMY FUSION N/A 03/11/2012   Procedure: ANTERIOR CERVICAL DECOMPRESSION/DISCECTOMY FUSION 1 LEVEL;  Surgeon: Cristi Loron, MD;  Location: [facility] NEURO ORS;  Service:  Neurosurgery;  Laterality: N/A;  Cervical five-six anterior cervical decompression with fusion interbody prothesis plating and bonegraft   BACK SURGERY     EXTERNAL FIXATION LEG Right 08/10/2017   Procedure: EXTERNAL FIXATION ANKLE;  Surgeon: Donato Heinz, MD;  Location: [facility] ORS;  Service: Orthopedics;  Laterality: Right;   EXTERNAL FIXATION REMOVAL Right 08/18/2017   Procedure: REMOVAL EXTERNAL FIXATION LEG;  Surgeon: Myrene Galas, MD;  Location: [facility] OR;  Service: Orthopedics;  Laterality: Right;   EYE SURGERY     HERNIA REPAIR     IR FL GUIDED LOC OF NEEDLE/CATH TIP FOR SPINAL INJECTION LT  12/09/2021   IR FL GUIDED LOC OF NEEDLE/CATH TIP FOR SPINAL INJECTION RT  12/09/2021   JOINT REPLACEMENT     bil knees   ORIF ANKLE FRACTURE Right 08/18/2017   Procedure: OPEN REDUCTION INTERNAL FIXATION (ORIF) ANKLE FRACTURE;  Surgeon: Myrene Galas, MD;  Location: [facility] OR;  Service: Orthopedics;  Laterality: Right;   REPLACEMENT TOTAL KNEE BILATERAL      Allergies  Allergen Reactions   Tramadol Nausea And Vomiting and Hypertension   Augmentin [Amoxicillin-Pot Clavulanate] Rash    Prior to Admission medications   Medication Sig Start Date End Date Taking? Authorizing Provider  albuterol (VENTOLIN HFA) 108 (90 Base) MCG/ACT inhaler INHALE 2 INHALATIONS BY MOUTH  EVERY 4 HOURS AS NEEDED FOR  WHEEZING OR FOR SHORTNESS OF  BREATH 12/30/22  Yes Karamalegos, Netta Neat, DO  atorvastatin (LIPITOR)  40 MG tablet TAKE 1 TABLET BY MOUTH AT  BEDTIME 01/08/23  Yes Karamalegos, Netta Neat, DO  buPROPion (WELLBUTRIN XL) 150 MG 24 hr tablet Take 1 tablet (150 mg total) by mouth daily. 12/25/22  Yes Karamalegos, Netta Neat, DO  citalopram (CELEXA) 40 MG tablet TAKE 1 TABLET BY MOUTH DAILY 03/10/23  Yes Karamalegos, Netta Neat, DO  clopidogrel (PLAVIX) 75 MG tablet TAKE ONE TABLET BY MOUTH DAILY AT 9AM 06/27/22  Yes Karamalegos, Netta Neat, DO  cyclobenzaprine (FLEXERIL) 10 MG tablet Take 10 mg by mouth 3 (three)  times daily.   Yes [provider]  fluticasone (FLONASE) 50 MCG/ACT nasal spray Place 2 sprays into both nostrils daily. Use for 4-6 weeks then stop and use seasonally or as needed. 03/02/23  Yes Karamalegos, Netta Neat, DO  gabapentin (NEURONTIN) 600 MG tablet TAKE 1 TABLET BY MOUTH 4 TIMES  DAILY 09/23/22  Yes Smitty Cords, DO  MOUNJARO 10 MG/0.5ML Pen INJECT SUBCUTANEOUSLY 10 MG ONCE WEEKLY 09/23/22  Yes Karamalegos, Netta Neat, DO  mupirocin ointment (BACTROBAN) 2 % Apply 1 Application topically 2 (two) times daily. For 10 days 03/09/23  Yes Karamalegos, Netta Neat, DO  nystatin (MYCOSTATIN/NYSTOP) powder APPLY 1 APPLICATION TOPICALLY THREE TIMES DAILY AS NEEDED FOR YEAST RASH INTERTRIGO 06/27/22  Yes Karamalegos, Netta Neat, DO  ondansetron (ZOFRAN-ODT) 4 MG disintegrating tablet Take 1 tablet (4 mg total) by mouth every 8 (eight) hours as needed for nausea or vomiting. 02/05/23  Yes Karamalegos, Netta Neat, DO  oxyCODONE-acetaminophen (PERCOCET) 10-325 MG tablet Take 1 tablet by mouth every 8 (eight) hours as needed for pain. 05/06/22  Yes Karamalegos, Netta Neat, DO  pantoprazole (PROTONIX) 40 MG tablet TAKE 1 TABLET BY MOUTH DAILY AT  9 AM 02/03/23  Yes Karamalegos, Alexander J, DO  QUEtiapine (SEROQUEL) 100 MG tablet TAKE 1 TABLET BY MOUTH DAILY AT  9 PM AT BEDTIME 10/07/22  Yes Karamalegos, Alexander J, DO  topiramate (TOPAMAX) 100 MG tablet TAKE 1 TABLET BY MOUTH TWICE  DAILY 10/07/22  Yes Karamalegos, Alexander J, DO  TRELEGY ELLIPTA 100-62.5-25 MCG/ACT AEPB USE 1 INHALATION BY MOUTH ONCE  DAILY AT THE SAME TIME EACH DAY 02/17/23  Yes Karamalegos, Netta Neat, DO  trimethoprim-polymyxin b (POLYTRIM) ophthalmic solution Place 1 drop into both eyes 4 (four) times daily. For 7-10 days 03/02/23  Yes Karamalegos, Netta Neat, DO  VITAMIN D PO Take by mouth.   Yes [provider]  Vitamin D, Ergocalciferol, (DRISDOL) 1.25 MG (50000 UNIT) CAPS capsule TAKE 1 CAPSULE BY  MOUTH EVERY WEDNESDAY @9AM  06/27/22  Yes Karamalegos, Netta Neat, DO  azelastine (ASTELIN) 0.1 % nasal spray Place 2 sprays into both nostrils 2 (two) times daily. Patient not taking: Reported on 03/25/2023 10/25/21   Smitty Cords, DO  fluconazole (DIFLUCAN) 150 MG tablet Take one tablet by mouth on Day 1. Repeat dose 2nd tablet on Day 3. 03/02/23   Althea Charon, Netta Neat, DO  levofloxacin (LEVAQUIN) 500 MG tablet Take 1 tablet (500 mg total) by mouth daily. For 7 days 03/02/23   Smitty Cords, DO  metFORMIN (GLUCOPHAGE) 1000 MG tablet TAKE 1 TABLET BY MOUTH TWICE  DAILY WITH MEALS Patient not taking: Reported on 03/02/2023 09/23/22   Smitty Cords, DO  midodrine (PROAMATINE) 5 MG tablet Take 1 tablet (5 mg total) by mouth 3 (three) times daily with meals. Patient not taking: Reported on 03/02/2023 10/20/22   Debbe Odea, MD    Social History   Socioeconomic History  Marital status: Married    Spouse name: Not on file   Number of children: Not on file   Years of education: Not on file   Highest education level: 10th grade  Occupational History   Not on file  Tobacco Use   Smoking status: Every Day    Current packs/day: 1.00    Average packs/day: 1 pack/day for 20.0 years (20.0 ttl pk-yrs)    Types: Cigarettes   Smokeless tobacco: Never  Vaping Use   Vaping status: Never Used  Substance and Sexual Activity   Alcohol use: No   Drug use: Yes    Types: Marijuana, Oxycodone    Comment: last smoked 2 days ago  8/4   Sexual activity: Not on file  Other Topics Concern   Not on file  Social History Narrative   Not on file   Social Drivers of Health   Financial Resource Strain: Low Risk  (11/19/2022)   Overall Financial Resource Strain (CARDIA)    Difficulty of Paying Living Expenses: Not very hard  Food Insecurity: Food Insecurity Present (11/19/2022)   Hunger Vital Sign    Worried About Running Out of Food in the Last Year: Sometimes true     Ran Out of Food in the Last Year: Patient declined  Transportation Needs: No Transportation Needs (11/19/2022)   PRAPARE - Administrator, Civil Service (Medical): No    Lack of Transportation (Non-Medical): No  Physical Activity: Inactive (11/19/2022)   Exercise Vital Sign    Days of Exercise per Week: 0 days    Minutes of Exercise per Session: 0 min  Stress: Stress Concern Present (11/19/2022)   [employer] of Occupational Health - Occupational Stress Questionnaire    Feeling of Stress : To some extent  Social Connections: Moderately Isolated (11/19/2022)   Social Connection and Isolation Panel [NHANES]    Frequency of Communication with Friends and Family: More than three times a week    Frequency of Social Gatherings with Friends and Family: Patient declined    Attends Religious Services: Never    Database administrator or Organizations: No    Attends Banker Meetings: Never    Marital Status: Married  Catering manager Violence: Unknown (05/26/2022)   Received from [facility]   HITS    Physically Hurt: Not on file    Insult or Talk Down To: Not on file    Threaten Physical Harm: Not on file    Scream or Curse: Not on file     Family History  Problem Relation Age of Onset   Diabetes Mother    Hypertension Mother    Hypertension Father     ROS: Otherwise negative unless mentioned in HPI  Physical Examination  Vitals:   03/26/23 0447 03/26/23 0712  BP: (!) 89/65 (!) 85/54  Pulse: 81 81  Resp: 18 16  Temp: (!) 97.2 F (36.2 C)   SpO2: 97% 96%   Body mass index is 34.86 kg/m.  General:  WDWN in NAD Gait: Not observed HENT: WNL, normocephalic Pulmonary: normal non-labored breathing, without Rales, rhonchi,  wheezing Cardiac: regular, without  Murmurs, rubs or gallops; without carotid bruits Abdomen: Positive bowel sounds throughout, soft, NT/ND, no masses Positive constipation  Skin: without rashes Vascular Exam/Pulses: Palpable  pulses throughout except unable to palpate or doppler left Dorsalis pedis pulse.  Extremities: without ischemic changes, with Gangrene , with cellulitis; without open wounds;  Musculoskeletal: no muscle wasting or atroph Positive Morbid Obesity  Neurologic: A&O X 3;  Positive right sided weakness are detected; speech is fluent/normal Psychiatric:  The pt has Normal affect. Lymph:  Unremarkable  CBC    Component Value Date/Time   WBC 11.7 (H) 03/26/2023 0632   RBC 4.28 03/26/2023 0632   HGB 12.6 03/26/2023 0632   HGB 14.6 09/22/2013 2251   HCT 38.2 03/26/2023 0632   HCT 44.3 09/22/2013 2251   PLT 261 03/26/2023 0632   PLT 300 09/22/2013 2251   MCV 89.3 03/26/2023 0632   MCV 90 09/22/2013 2251   MCH 29.4 03/26/2023 0632   MCHC 33.0 03/26/2023 0632   RDW 13.6 03/26/2023 0632   RDW 13.8 09/22/2013 2251   LYMPHSABS 3.0 03/25/2023 1821   LYMPHSABS 4.5 (H) 08/02/2013 2118   MONOABS 0.9 03/25/2023 1821   MONOABS 1.3 (H) 08/02/2013 2118   EOSABS 0.0 03/25/2023 1821   EOSABS 0.1 08/02/2013 2118   BASOSABS 0.0 03/25/2023 1821   BASOSABS 0.0 08/02/2013 2118    BMET    Component Value Date/Time   NA 135 03/26/2023 0632   NA 135 (L) 09/22/2013 2251   K 3.6 03/26/2023 0632   K 3.8 09/22/2013 2251   CL 109 03/26/2023 0632   CL 100 09/22/2013 2251   CO2 19 (L) 03/26/2023 0632   CO2 27 09/22/2013 2251   GLUCOSE 107 (H) 03/26/2023 0632   GLUCOSE 335 (H) 09/22/2013 2251   BUN 17 03/26/2023 0632   BUN 11 09/22/2013 2251   CREATININE 0.62 03/26/2023 0632   CREATININE 0.62 05/15/2021 1351   CALCIUM 8.5 (L) 03/26/2023 0632   CALCIUM 8.3 (L) 09/22/2013 2251   GFRNONAA >60 03/26/2023 0632   GFRNONAA >60 09/22/2013 2251   GFRAA >60 08/18/2017 0707   GFRAA >60 09/22/2013 2251    COAGS: Lab Results  Component Value Date   INR 1.1 03/25/2023   INR 1.3 (H) 12/09/2021   INR 1.04 08/18/2017     Non-Invasive Vascular Imaging:   EXAM:03/25/23 LEFT GREAT TOE   COMPARISON:  None  Available.   FINDINGS: Ulcer or wound at the tip of the great toe. Osseous destructive change involving tuft of first distal phalanx. Mild degenerative change at the first MTP joint. No soft tissue emphysema   IMPRESSION: Ulcer or wound at the tip of the great toe with osseous destructive change involving tuft of first distal phalanx consistent with osteomyelitis.  LOWER EXTREMITY DOPPLER STUDY   Patient Name:  [name]  Date of Exam:   08/27/2022  Medical Rec #: [id number]       Accession #:    [id number]  Date of Birth: December 07, 1965        Patient Gender: F  Patient Age:   15 years  Exam Location:  [facility]  Procedure:      VAS [place] ABI WITH/WO TBI  Referring Phys: Lemar Livings    ---------------------------------------------------------------------------  -----    Indications: Peripheral artery disease.   High Risk Factors: Hypertension, hyperlipidemia, Diabetes, current smoker,  prior                    CVA.   Other Factors: Hx Liver Ca.   Limitations: Today's exam was limited due to patient unable to lie flat,  patient              positioning and involuntary patient movement.   Comparison Study: 08/20/22 CTA Ao/Bifem  06/04/22 ABI   Performing Technologist: Lowell Guitar RVT, RDMS     Examination Guidelines: A complete evaluation includes at minimum, Doppler  waveform signals and systolic blood pressure reading at the level of  bilateral  brachial, anterior tibial, and posterior tibial arteries, when vessel  segments  are accessible. Bilateral testing is considered an integral part of a  complete  examination. Photoelectric Plethysmograph (PPG) waveforms and toe systolic  pressure readings are included as required and additional duplex testing  as  needed. Limited examinations for reoccurring indications may be performed  as  noted.     ABI Findings:  +---------+------------------+-----+-----------+--------+  Right    Rt Pressure (mmHg)IndexWaveform   Comment   +---------+------------------+-----+-----------+--------+  Brachial 121                                         +---------+------------------+-----+-----------+--------+  PTA     72                0.60 monophasic           +---------+------------------+-----+-----------+--------+  DP      90                0.74 multiphasic          +---------+------------------+-----+-----------+--------+  Great Toe57                0.47 Abnormal             +---------+------------------+-----+-----------+--------+   +---------+----------------+-----+----------+------------------------------  ----+  Left    Lt Pressure     IndexWaveform  Comment                                       (mmHg)                                                              +---------+----------------+-----+----------+------------------------------  ----+  Brachial 118                                                                 +---------+----------------+-----+----------+------------------------------  ----+  PTA     120             0.99 monophasic                                     +---------+----------------+-----+----------+------------------------------  ----+  DP      115             0.95 monophasic                                     +---------+----------------+-----+----------+------------------------------  ----+  Great Toe71              0.59 Normal  difficult due to involuntary                                                 movement                             +---------+----------------+-----+----------+------------------------------  ----+   +-------+-----------+-----------+------------+------------+  ABI/TBIToday's ABIToday's TBIPrevious ABIPrevious TBI  +-------+-----------+-----------+------------+------------+  Right 0.74       0.47       0.70        0.36           +-------+-----------+-----------+------------+------------+  Left  0.99       0.59       0.83        0.70          +-------+-----------+-----------+------------+------------+        Arterial wall calcification precludes accurate ankle pressures and ABIs.  Right ABIs and TBIs appear essentially unchanged. Left ABIs appear  increased. Left TBI is decreased    Summary:  Right: Resting right ankle-brachial index indicates moderate right lower  extremity arterial disease. The right toe-brachial index is abnormal. PPG  tracings appear dampened.   Left: Resting left ankle-brachial index is within normal range. The left  toe-brachial index is abnormal.    *See table(s) above for measurements and observations.      Electronically signed by Lemar Livings MD on 08/28/2022 at 11:57:10 AM.       Statin:  Yes.   Beta Blocker:  No. Aspirin:  No. ACEI:  No. ARB:  No. CCB use:  No Other antiplatelets/anticoagulants:  Yes.   Plavix 75 mg Daily   ASSESSMENT/PLAN: [name] with a previous history of stroke and PAD who presents to [facility] emergency department complaining of fever.  She was recently admitted to [facility] for the wound on her left great toe was eventually found to have osteomyelitis and started on antibiotics.  She endorses her toe has been this way for 2 weeks and is not healing but getting worse.  PLAN Vascular surgery plans on taking the patient to the vascular lab on Friday, 03/27/2023 for left lower extremity angiogram with possible intervention.-This morning at the bedside in the emergency department patient is resting comfortably.  I had a long detailed discussion with the patient regarding the procedure, benefits, risks, and complications.  She verbalized her understanding wishes to proceed.  I answered all her questions this morning.  Patient will be made n.p.o. after midnight tonight for procedure tomorrow.  I discussed the case in detail with  Dr. Levora Dredge MD and he agrees with the plan.   Marcie Bal [facility] 03/26/2023 7:50 AM

## 2023-03-27 ENCOUNTER — Encounter
Admission: EM | Disposition: A | Discharge status: Home / Self Care | Payer: Self-pay | Source: Home / Self Care | Attending: Internal Medicine

## 2023-03-27 DIAGNOSIS — I70202 Unspecified atherosclerosis of native arteries of extremities, left leg: Secondary | ICD-10-CM | POA: Diagnosis not present

## 2023-03-27 DIAGNOSIS — M86472 Chronic osteomyelitis with draining sinus, left ankle and foot: Secondary | ICD-10-CM | POA: Diagnosis not present

## 2023-03-27 HISTORY — PX: LOWER EXTREMITY ANGIOGRAPHY: CATH118251

## 2023-03-27 LAB — BASIC METABOLIC PANEL
Anion gap: 9 (ref 5–15)
BUN: 11 mg/dL (ref 6–20)
CO2: 21 mmol/L — ABNORMAL LOW (ref 22–32)
Calcium: 8.5 mg/dL — ABNORMAL LOW (ref 8.9–10.3)
Chloride: 114 mmol/L — ABNORMAL HIGH (ref 98–111)
Creatinine, Ser: 0.54 mg/dL (ref 0.44–1.00)
GFR, Estimated: >60 mL/min (ref >60)
Glucose, Bld: 104 mg/dL — ABNORMAL HIGH (ref 70–99)
Potassium: 3.6 mmol/L (ref 3.5–5.1)
Sodium: 139 mmol/L (ref 135–145)

## 2023-03-27 LAB — CBC
HCT: 36.4 % (ref 36.0–46.0)
Hemoglobin: 12 g/dL (ref 12.0–15.0)
MCH: 29.6 pg (ref 26.0–34.0)
MCHC: 33 g/dL (ref 30.0–36.0)
MCV: 89.7 fL (ref 80.0–100.0)
Platelets: 267 10*3/uL (ref 150–400)
RBC: 4.06 MIL/uL (ref 3.87–5.11)
RDW: 13.5 % (ref 11.5–15.5)
WBC: 8.4 10*3/uL (ref 4.0–10.5)
nRBC: 0 % (ref 0.0–0.2)

## 2023-03-27 LAB — GLUCOSE, CAPILLARY
Glucose-Capillary: 103 mg/dL — ABNORMAL HIGH (ref 70–99)
Glucose-Capillary: 95 mg/dL (ref 70–99)
Glucose-Capillary: 125 mg/dL — ABNORMAL HIGH (ref 70–99)
Glucose-Capillary: 92 mg/dL (ref 70–99)
Glucose-Capillary: 109 mg/dL — ABNORMAL HIGH (ref 70–99)

## 2023-03-27 MED ORDER — OXYCODONE-ACETAMINOPHEN 5-325 MG PO TABS
2.0000 | ORAL_TABLET | Freq: Four times a day (QID) | ORAL | Status: DC | PRN
  Administered 2023-03-27 – 2023-04-01 (×19): 2 via ORAL
  Filled 2023-03-27 (×19): qty 2

## 2023-03-27 MED ORDER — FENTANYL CITRATE (PF) 100 MCG/2ML IJ SOLN
INTRAMUSCULAR | Status: DC | PRN
  Administered 2023-03-27: 50 ug via INTRAVENOUS

## 2023-03-27 MED ORDER — MIDAZOLAM HCL 2 MG/2ML IJ SOLN
INTRAMUSCULAR | Status: AC
  Filled 2023-03-27: qty 2

## 2023-03-27 MED ORDER — DIPHENHYDRAMINE HCL 50 MG/ML IJ SOLN
INTRAMUSCULAR | Status: DC | PRN
  Administered 2023-03-27 (×2): 25 mg via INTRAVENOUS

## 2023-03-27 MED ORDER — MIDAZOLAM HCL 2 MG/2ML IJ SOLN
INTRAMUSCULAR | Status: DC | PRN
  Administered 2023-03-27: 2 mg via INTRAVENOUS

## 2023-03-27 MED ORDER — DIPHENHYDRAMINE HCL 50 MG/ML IJ SOLN
INTRAMUSCULAR | Status: AC
  Filled 2023-03-27: qty 1

## 2023-03-27 MED ORDER — HEPARIN SODIUM (PORCINE) 1000 UNIT/ML IJ SOLN
INTRAMUSCULAR | Status: AC
  Filled 2023-03-27: qty 10

## 2023-03-27 MED ORDER — FENTANYL CITRATE (PF) 100 MCG/2ML IJ SOLN
INTRAMUSCULAR | Status: AC
  Filled 2023-03-27: qty 2

## 2023-03-27 SURGICAL SUPPLY — 1 items: NDL ENTRY 21GA 7CM ECHOTIP (NEEDLE) IMPLANT

## 2023-03-27 NOTE — Plan of Care (Signed)

## 2023-03-27 NOTE — Plan of Care (Signed)
 Patient alert and oriented. Medicated for complaints of pain with short term relief. All patient needs attended to. Patient verbalized understanding that she will be having a vascular procedure, consent signed. Will continue to monitor.   Problem: Coping: Goal: Ability to adjust to condition or change in health will improve Outcome: Progressing   Problem: Metabolic: Goal: Ability to maintain appropriate glucose levels will improve Outcome: Progressing   Problem: Pain Managment: Goal: General experience of comfort will improve and/or be controlled Outcome: Progressing   Problem: Safety: Goal: Ability to remain free from injury will improve Outcome: Progressing

## 2023-03-27 NOTE — Interval H&P Note (Signed)
 History and Physical Interval Note:  03/27/2023 3:01 PM  [name]  has presented today for surgery, with the diagnosis of PAD.  The various methods of treatment have been discussed with the patient and family. After consideration of risks, benefits and other options for treatment, the patient has consented to  Procedure(s): Lower Extremity Angiography (Left) as a surgical intervention.  The patient's history has been reviewed, patient examined, no change in status, stable for surgery.  I have reviewed the patient's chart and labs.  Questions were answered to the patient's satisfaction.     Levora Dredge

## 2023-03-27 NOTE — Op Note (Signed)
 [facility]  Percutaneous Study/Intervention Procedural Note   Date of Surgery: 03/27/2023,4:20 PM  Surgeon:Yolonda Purtle, Latina Craver   Pre-operative Diagnosis: Atherosclerotic occlusive disease bilateral lower extremities with osteomyelitis of the left forefoot  Post-operative diagnosis:  Same  Procedure(s) Performed:  1.  Abdominal aortogram  2.  Selective injection of the left lower extremity third order catheter placement  3.  Ultrasound-guided access to the right common femoral artery  4.  StarClose right femoral artery    Anesthesia: Conscious sedation was administered by the interventional radiology RN under my direct supervision. IV Versed plus fentanyl were utilized. Continuous ECG, pulse oximetry and blood pressure was monitored throughout the entire procedure.  Conscious sedation was administered for a total of 32 minutes.  Sheath: 5 French 11 cm Pinnacle sheath retrograde right common femoral  Contrast: 35 cc   Fluoroscopy Time: 2.6 minutes  Indications:  The patient presents to [facility] with atherosclerotic occlusive disease bilateral lower extremities with osteomyelitis of the left forefoot.  Pedal pulses are nonpalpable bilaterally suggesting hemodynamically significant atherosclerotic occlusive disease.  The risks and benefits as well as alternative therapies for lower extremity revascularization are reviewed with the patient all questions are answered the patient agrees to proceed.  The patient is therefore undergoing angiography with the hope for intervention for limb salvage.   Procedure:  DONTA MCINROY a 58 y.o. female who was identified and appropriate procedural time out was performed.  The patient was then placed supine on the table and prepped and draped in the usual sterile fashion.  Ultrasound was used to evaluate the right common femoral artery.  It was echolucent and pulsatile indicating it is patent .  An ultrasound image was  acquired for the permanent record.  A micropuncture needle was used to access the right common femoral artery under direct ultrasound guidance.  The microwire was then advanced under fluoroscopic guidance without difficulty followed by the micro-sheath.  A 0.035 J wire was advanced without resistance and a 5Fr sheath was placed.    Pigtail catheter was then advanced to the level of T12 and AP projection of the aorta was obtained. Pigtail catheter was then repositioned to above the bifurcation and RAO view of the pelvis was obtained. Stiff angled Glidewire and pigtail catheter was then used across the bifurcation and the catheter was positioned in the distal external iliac artery.  LAO of the left groin was then obtained. Wire was reintroduced and negotiated into the SFA and the catheter was advanced into the SFA. Distal runoff was then performed.  After review of the images the catheter was removed over wire and an RAO view of the groin was obtained. StarClose device was deployed without difficulty.   Findings:   Aortogram: The abdominal aorta is opacified with a bolus injection contrast.  Single renal arteries are noted bilaterally with normal nephrograms.  No evidence of hemodynamically significant renal artery stenosis.  There are no hemodynamically significant stenoses identified within the aorta.  The aortic bifurcation is mildly diseased but widely patent.  Bilateral common internal and external iliac arteries are free of hemodynamically significant lesions.  Left lower Extremity: The left common femoral demonstrates a 70 to 75% stenosis throughout its course with a very large proximal ulceration.  The femoral bifurcation is patent, profunda femoris superficial femoral arteries demonstrate mild atherosclerotic changes but there are no hemodynamically significant lesions noted (in the SFA this is down to Hunter's canal).  The above-knee popliteal demonstrates moderate to severe diffuse  disease in the  above-knee segment, behind the knee there is a string sign greater than 90% which extends for approximately 3 cm.  The below-knee popliteal is patent without hemodynamically significant stenosis.  The trifurcation is patent.  There is 3 vessel runoff to the foot with the posterior tibial being the dominant and filling of the dorsalis pedis quite slow although I do not see an occlusive lesion in the distal anterior tibial.  SUMMARY: Based on these images no intervention is performed at this time.  The patient will require a left femoral endarterectomy and then stenting of the popliteal artery at the time of surgery.    Disposition: Patient was taken to the recovery room in stable condition having tolerated the procedure well.  Earl Lites Andelyn Spade 03/27/2023,4:20 PM

## 2023-03-27 NOTE — Consult Note (Signed)
 Pharmacy Antibiotic Note  [name] is a 58 y.o. female admitted on 03/25/2023 with  Left foot osteomyelitis .  Pharmacy has been consulted for pip/tazo and vancomycin dosing. Imaging of foot pending. Afeb, WBC 15, pt was complaining of a fever per notes. She has a wound on left great toe, at [facility] [place] found to have OM and was started on abx. She was sent home on doxy + cipro. Cannot find any culture data for OM.  No weight in chart so I used previous weight listed in the chart, 95 kg from the end of February.   3/14 update: Patient is planned to undergo left great toe amputation either today pending vascular plans, but procedure may happen 3/17 instead. In that case, antibiotics will be continuing through weekend.  Plan: Continue Zosyn 3.375 g IV q8H Continue vancomycin 1000 mg IV q12H eAUC 480, Cmin 14, Scr 0.8, Vd 0.82, IBW Will order levels after tonight's dose, which will be 4th maintenance dose. Half-life for vancomycin with patient's current renal function is an estimated 11-12 hours. Follow up culture results to assess for antibiotic optimization. Continue to monitor renal function to assess for any necessary antibiotic dosing changes.  Height: 5\' 5"  (165.1 cm) Weight: 95 kg (209 lb 8 oz) IBW/kg (Calculated) : 57  Temp (24hrs), Avg:98.3 F (36.8 C), Min:98 F (36.7 C), Max:98.5 F (36.9 C)  Recent Labs  Lab 03/25/23 1821 03/25/23 1822 03/25/23 1956 03/26/23 0632 03/27/23 0912  WBC 15.2*  --   --  11.7* 8.4  CREATININE  --  0.70  --  0.62 0.54  LATICACIDVEN 2.0*  --  1.5  --   --     Estimated Creatinine Clearance: 86.3 mL/min (by C-G formula based on SCr of 0.54 mg/dL).    Allergies  Allergen Reactions   Tramadol Nausea And Vomiting and Hypertension   Augmentin [Amoxicillin-Pot Clavulanate] Rash    Tolerated Zosyn 3/13    Antimicrobials this admission: 3/12 pip/tazo >>  3/12 vancomycin >>   Dose adjustments this admission: None  Microbiology  results: 3/12 BCx: [id number]   Thank you for allowing pharmacy to be a part of this patient's care.  Will M. Dareen Piano, PharmD Clinical Pharmacist 03/27/2023 10:27 AM

## 2023-03-27 NOTE — Progress Notes (Addendum)
 Progress Note   Patient: [name] GEX:[id number] DOB: [birth year] DOA: 03/25/2023     2 DOS: the patient was seen and examined on 03/27/2023   Brief hospital course:  [name] is a 58 y.o. female with medical history significant of osteomyelitis of left great toe, HTN, HLD, DM, COPD, P:VD, stroke with right-sided weakness,, depression with anxiety, migraine, kidney stone, diverticulitis, tobacco abuse, who presents with left foot pain.   Patient was recently hospitalized to [name] due to left great toe osteomyelitis. She was discharged home on oral antibiotics of doxycycline and Cipro, with plan for outpatient follow-up with vascular surgery. She has finished a course of antibiotics today, however she has worsening left great toe pain, which is constant, sharp, severe, nonradiating.  Left great toe is erythematous and swelling.  She also developed chills and fever of 100.6 today.  No nausea, vomiting, diarrhea or abdominal pain.  Patient is constipated.  No cough, SOB and chest pain. No symptoms of UTI. It is unable to palpate DP pulses bilaterally,  but was able to obtain biphasic Doppler signals per EDP, Dr, Larinda Buttery.   MRI-left lower extremity on 03/12/23: Distal great toe soft tissue ulcer with active osteomyelitis of the great toe distal phalanx. Abnormal signal involves the entire distal phalanx.  No evidence for soft tissue abscess.    Data reviewed independently and ED Course: pt was found to have WBC 15.2, lactic acid 2.0, GFR> 60, INR 1.1, PTT 28.  Temperature 98.2, blood pressure 117/76, heart rate 112, 96, RR 18, oxygen saturation 100% on room air.  Patient is admitted to MedSurg bed as inpatient. Consulted Dr. Gilda Crease of [facility] and Dr. Annamary Rummage of podiatry.   X-ray of left great toe: Ulcer or wound at the tip of the great toe with osseous destructive change involving tuft of first distal phalanx consistent with osteomyelitis.     EKG: I have personally reviewed.   Sinus rhythm, QTc 443, low voltage, borderline left axis deviation   Assessment and Plan:  Osteomyelitis of left foot [name]):  Known peripheral arterial disease Patient failed outpatient oral antibiotic treatment and recently completed a course of doxycycline and ciprofloxacin.   Patient had WBC 15.2, lactic acid of 2.0, reporting subjective fever, but her temperature was normal in ED ( 98.2)  Leukocytosis has normalized Continue empiric antibiotic therapy with vancomycin and Zosyn Podiatry has been consulted with plans to amputate the left great toe most likely on 03/17 following patient's vascular surgery procedure, patient is scheduled for left lower extremity angiogram with possible intervention. Pain control Plavix was placed on hold for possible procedure Continue atorvastatin      Type 2 diabetes mellitus with peripheral vascular disease (HCC):  Recent A1c 5.2 Hold metformin and Mounjaro at home Continue sliding scale insulin Maintain consistent carbohydrate diet     History of cerebrovascular accident (CVA) with residual deficit -Lipitor -Hold Plavix for possible surgery     HTN (hypertension):  Patient with relative hypotension but asymptomatic Continue midodrine 5 mg 3 times daily       COPD (chronic obstructive pulmonary disease) (HCC): Not acutely exacerbated -Bronchodilators and as needed Mucinex     Tobacco abuse -Nicotine patch     Anxiety and depression -Continue Wellbutrin, Celexa, Seroquel,     Obesity: Class II BMI 38.19 Lifestyle modification and exercise has been discussed with patient in detail          Subjective: Patient is seen and examined. Continues to complain of pain  in her left foot  Physical Exam: Vitals:   03/27/23 1522 03/27/23 1524 03/27/23 1526 03/27/23 1530  BP: (!) 137/95 132/80  111/85  Pulse: 78 77 (!) 0 76  Resp:    13  Temp:      TempSrc:      SpO2: 100% 98%  98%  Weight:      Height:       General: Not in  acute distress HEENT:       Eyes: PERRL, EOMI, no jaundice       ENT: No discharge from the ears and nose, no pharynx injection, no tonsillar enlargement.        Neck: No JVD, no bruit, no mass felt. Heme: No neck lymph node enlargement. Cardiac: S1/S2, RRR, No murmurs, No gallops or rubs. Respiratory: No rales, wheezing, rhonchi or rubs. GI: Soft, nondistended, nontender, no rebound pain, no organomegaly, BS present. GU: No hematuria Ext: No pitting leg edema bilaterally.  Has erythema, tenderness, swelling, warmth, scabbed ulcer in right great toe    Musculoskeletal: No joint deformities, No joint redness or warmth, no limitation of ROM in spin. Skin: No rashes.  Neuro: Alert, oriented X3, cranial nerves II-XII grossly intact, has chronic residual right-sided weakness from previous stroke   Psych: Patient is not psychotic, no suicidal or homicidal ideation.    Data Reviewed:  There are no new results to review at this time.  Family Communication: Plan of care was discussed with patient in detail and she verbalizes understanding and agrees with the plan  Disposition: Status is: Inpatient Remains inpatient appropriate because: For amputation of left great toe on 03/17  Planned Discharge Destination: Home    Time spent: 35 minutes  Author: Lucile Shutters, MD 03/27/2023 3:50 PM  For on call review [email or web address].

## 2023-03-28 ENCOUNTER — Inpatient Hospital Stay
Admit: 2023-03-28 | Discharge: 2023-03-28 | Disposition: A | Discharge status: Home / Self Care | Attending: Internal Medicine | Admitting: Internal Medicine

## 2023-03-28 DIAGNOSIS — M86172 Other acute osteomyelitis, left ankle and foot: Secondary | ICD-10-CM | POA: Diagnosis not present

## 2023-03-28 DIAGNOSIS — M86472 Chronic osteomyelitis with draining sinus, left ankle and foot: Secondary | ICD-10-CM | POA: Diagnosis not present

## 2023-03-28 LAB — GLUCOSE, CAPILLARY
Glucose-Capillary: 119 mg/dL — ABNORMAL HIGH (ref 70–99)
Glucose-Capillary: 110 mg/dL — ABNORMAL HIGH (ref 70–99)
Glucose-Capillary: 107 mg/dL — ABNORMAL HIGH (ref 70–99)
Glucose-Capillary: 100 mg/dL — ABNORMAL HIGH (ref 70–99)

## 2023-03-28 LAB — CREATININE, SERUM
Creatinine, Ser: 0.53 mg/dL (ref 0.44–1.00)
GFR, Estimated: >60 mL/min (ref >60)

## 2023-03-28 LAB — VANCOMYCIN, PEAK: Vancomycin Pk: 22 ug/mL — ABNORMAL LOW (ref 30–40)

## 2023-03-28 LAB — VANCOMYCIN, TROUGH: Vancomycin Tr: 16 ug/mL (ref 15–20)

## 2023-03-28 MED ORDER — POLYETHYLENE GLYCOL 3350 17 G PO PACK
17.0000 g | PACK | Freq: Every day | ORAL | Status: DC
  Administered 2023-03-28 – 2023-03-31 (×3): 17 g via ORAL
  Filled 2023-03-28 (×4): qty 1

## 2023-03-28 NOTE — Progress Notes (Signed)
 Progress Note   Patient: [name] XBJ:[id number] DOB: [birth year] DOA: 03/25/2023     3 DOS: the patient was seen and examined on 03/28/2023   Brief hospital course:  [name] is a 58 y.o. female with medical history significant of osteomyelitis of left great toe, HTN, HLD, DM, COPD, P:VD, stroke with right-sided weakness,, depression with anxiety, migraine, kidney stone, diverticulitis, tobacco abuse, who presents with left foot pain.   Patient was recently hospitalized to [facility] due to left great toe osteomyelitis. She was discharged home on oral antibiotics of doxycycline and Cipro, with plan for outpatient follow-up with vascular surgery. She has finished a course of antibiotics today, however she has worsening left great toe pain, which is constant, sharp, severe, nonradiating.  Left great toe is erythematous and swelling.  She also developed chills and fever of 100.6 today.  No nausea, vomiting, diarrhea or abdominal pain.  Patient is constipated.  No cough, SOB and chest pain. No symptoms of UTI. It is unable to palpate DP pulses bilaterally,  but was able to obtain biphasic Doppler signals per EDP, Dr, Larinda Buttery.   MRI-left lower extremity on 03/12/23: Distal great toe soft tissue ulcer with active osteomyelitis of the great toe distal phalanx. Abnormal signal involves the entire distal phalanx.  No evidence for soft tissue abscess.    Data reviewed independently and ED Course: pt was found to have WBC 15.2, lactic acid 2.0, GFR> 60, INR 1.1, PTT 28.  Temperature 98.2, blood pressure 117/76, heart rate 112, 96, RR 18, oxygen saturation 100% on room air.  Patient is admitted to MedSurg bed as inpatient. Consulted Dr. Gilda Crease of [facility] and Dr. Annamary Rummage of podiatry.   X-ray of left great toe: Ulcer or wound at the tip of the great toe with osseous destructive change involving tuft of first distal phalanx consistent with osteomyelitis.     EKG: I have personally reviewed.   Sinus rhythm, QTc 443, low voltage, borderline left axis deviation   Assessment and Plan:  Osteomyelitis of left foot [facility]):  Known peripheral arterial disease Patient failed outpatient oral antibiotic treatment and recently completed a course of doxycycline and ciprofloxacin.   Patient had WBC 15.2, lactic acid of 2.0, reporting subjective fever, but her temperature was normal in ED ( 98.2)  Leukocytosis has normalized Continue empiric antibiotic therapy with vancomycin and Zosyn Podiatry has been consulted with plans to amputate the left great toe following patient's vascular surgery procedure, patient is scheduled for left femoral endarterectomy and stenting of the popliteal artery planned for 03/19 Vascular surgery has requested cardiology for preoperative evaluation prior to vascular procedure Plavix was placed on hold for possible procedure Continue atorvastatin       Type 2 diabetes mellitus with peripheral vascular disease (HCC):  Recent A1c 5.2 Hold metformin and Mounjaro  Continue sliding scale insulin Maintain consistent carbohydrate diet     History of cerebrovascular accident (CVA) with residual deficit -Lipitor -Hold Plavix for possible surgery     HTN (hypertension):  Patient with relative hypotension but asymptomatic Continue midodrine 5 mg 3 times daily       COPD (chronic obstructive pulmonary disease) (HCC): Not acutely exacerbated -Bronchodilators and as needed Mucinex     Tobacco abuse -Nicotine patch     Anxiety and depression -Continue Wellbutrin, Celexa, Seroquel,     Obesity: Class II BMI 38.19 Lifestyle modification and exercise has been discussed with patient in detail  Subjective: Requesting for stool softener  Physical Exam: Vitals:   03/27/23 1600 03/27/23 1754 03/28/23 0353 03/28/23 0816  BP: (!) 142/70 118/70 95/63 109/69  Pulse: 78 81 74 79  Resp: 15  18 18   Temp:   97.6 F (36.4 C) 98 F (36.7 C)  TempSrc:       SpO2: 99% 100% 96% 96%  Weight:      Height:       General: Not in acute distress HEENT:       Eyes: PERRL, EOMI, no jaundice       ENT: No discharge from the ears and nose, no pharynx injection, no tonsillar enlargement.        Neck: No JVD, no bruit, no mass felt. Heme: No neck lymph node enlargement. Cardiac: S1/S2, RRR, No murmurs, No gallops or rubs. Respiratory: No rales, wheezing, rhonchi or rubs. GI: Soft, nondistended, nontender, no rebound pain, no organomegaly, BS present. GU: No hematuria Ext: No pitting leg edema bilaterally.  Has erythema, tenderness, swelling, warmth, scabbed ulcer in right great toe    Musculoskeletal: No joint deformities, No joint redness or warmth, no limitation of ROM in spin. Skin: No rashes.  Neuro: Alert, oriented X3, cranial nerves II-XII grossly intact, has chronic residual right-sided weakness from previous stroke   Psych: Patient is not psychotic, no suicidal or homicidal ideation.   Data Reviewed:  No new labs  Family Communication: Plan of care discussed with patient in detail  Disposition: Status is: Inpatient Remains inpatient appropriate because: Needs a vascular procedure and amputation of left great toe  Planned Discharge Destination:  TBD    Time spent: 36 minutes  Author: Lucile Shutters, MD 03/28/2023 1:32 PM  For on call review [email or web address].

## 2023-03-28 NOTE — Plan of Care (Signed)
 Patient without distress. Medicated for pain throughout shift. Will continue to monitor.   Problem: Coping: Goal: Ability to adjust to condition or change in health will improve Outcome: Not Progressing Note: Patient with anxiety. Multiple attempts to assist with her concerns done.    Problem: Skin Integrity: Goal: Risk for impaired skin integrity will decrease Outcome: Not Progressing

## 2023-03-28 NOTE — Progress Notes (Signed)
   PODIATRY PROGRESS NOTE Patient Name: [name]  DOB 1965-04-29 DOA 03/25/2023  Hospital Day: 4  Assessment:  58 y.o. female with PMH of left first toe osteomyelitis, hypertension, hyperlipidemia, DM, COPD, PAD, history of stroke with right-sided weakness.  AF, VSS  WBC: 8.4 down from 13.0 ESR/CRP: 15/0.6    Imaging: Left foot radiographs IMPRESSION: Ulcer or wound at the tip of the great toe with osseous destructive change involving tuft of first distal phalanx consistent with osteomyelitis.  Plan:  -Betadine wet-to-dry dressing applied today.  Change daily -Patient underwent angiogram on 3/14 which did not demonstrate 70 to 75% stenosis through the left common femoral artery, severe disease of the popliteal artery proximally.  Vascular planning left femoral endarterectomy and popliteal stenting on 3/19.  Fortunately the patient does have three-vessel runoff to the foot -Ulceration appears stable at this time.  Given the extent of her vascular disease and upcoming plans for intervention, plan to proceed with left first toe amputation following vascular intervention. -Weightbearing as tolerated in surgical shoe -Continue IV antibiotics Will continue to follow        Barbaraann Share, DPM [facility]    Subjective:  Patient seen resting comfortably at bedside.  No acute distress.  Objective:   Vitals:   03/28/23 0353 03/28/23 0816  BP: 95/63 109/69  Pulse: 74 79  Resp: 18 18  Temp: 97.6 F (36.4 C) 98 F (36.7 C)  SpO2: 96% 96%       Latest Ref Rng & Units 03/27/2023    9:12 AM 03/26/2023    6:32 AM 03/25/2023    6:21 PM  CBC  WBC 4.0 - 10.5 K/uL 8.4  11.7  15.2   Hemoglobin 12.0 - 15.0 g/dL 95.6  38.7  56.4   Hematocrit 36.0 - 46.0 % 36.4  38.2  45.0   Platelets 150 - 400 K/uL 267  261  389        Latest Ref Rng & Units 03/28/2023    1:53 AM 03/27/2023    9:12 AM 03/26/2023    6:32 AM  BMP  Glucose 70 - 99 mg/dL  332  951   BUN 6 - 20  mg/dL  11  17   Creatinine 8.84 - 1.00 mg/dL 1.66  0.63  0.16   Sodium 135 - 145 mmol/L  139  135   Potassium 3.5 - 5.1 mmol/L  3.6  3.6   Chloride 98 - 111 mmol/L  114  109   CO2 22 - 32 mmol/L  21  19   Calcium 8.9 - 10.3 mg/dL  8.5  8.5     General: AAOx3, NAD  Lower Extremity Exam Vasc: Nonpalpable DP and PT.  Dry to the first toe.  Capillary refill 3 to 5 seconds to digits  Derm: Eschar present left distal first toe, stable appearing.  No erythema Prophylactic bordered foam applied to the right heel  MSK:  Manual muscle strength testing 5/5 for dorsiflexion, plantarflexion, inversion and eversion.  No symptomatic limitations in joint range of motion.  Neuro: Epicritic sensation grossly   Radiology:  Results reviewed. See assessment for pertinent imaging results

## 2023-03-28 NOTE — Consult Note (Signed)
 [facility] CLINIC CARDIOLOGY CONSULT NOTE       Patient ID: [name] MRN: [id number] DOB/AGE: 09-12-1965 58 y.o.  Admit date: 03/25/2023 Referring Physician Dr Gilda Crease Primary Physician Althea Charon, Netta Neat, DO Primary Cardiologist Paraschos Reason for Consultation POC  HPI: [name] is a 58 y.o. female with medical history significant of osteomyelitis of left great toe, HTN, HLD, DM, COPD, P:VD, stroke with right-sided weakness,, depression with anxiety, migraine, kidney stone, diverticulitis, tobacco abuse, who presents with left foot pain.   Patient was recently hospitalized to [facility] due to left great toe osteomyelitis. She was discharged home on oral antibiotics of doxycycline and Cipro, with plan for outpatient follow-up with vascular surgery. She has finished a course of antibiotics today, however she has worsening left great toe pain, which is constant, sharp, severe, nonradiating.  Plan is for surgery on Wednesday. Denies chest pain, shortness of breath, palpitations, diaphoresis, syncope, edema, PND, orthopnea.   Review of systems complete and found to be negative unless listed above     Past Medical History:  Diagnosis Date   Anxiety    Cancer (HCC)    a spot on liver and treated    Complication of anesthesia    restless,easily upset   COPD (chronic obstructive pulmonary disease) (HCC)    Depression    Diabetes mellitus without complication (HCC)    Diverticulitis    Fatty liver    GERD (gastroesophageal reflux disease)    Headache(784.0)    migraines   History of kidney stones    Hyperlipidemia    Hypertension    PAD (peripheral artery disease) (HCC)    Pneumonia    Restless    Stroke [facility])     Past Surgical History:  Procedure Laterality Date   ABDOMINAL HYSTERECTOMY     ACHILLES TENDON LENGTHENING Right 08/18/2017   Procedure: ACHILLES TENDON LENGTHENING;  Surgeon: Myrene Galas, MD;  Location: [facility] OR;  Service: Orthopedics;   Laterality: Right;   ANTERIOR CERVICAL DECOMP/DISCECTOMY FUSION N/A 03/11/2012   Procedure: ANTERIOR CERVICAL DECOMPRESSION/DISCECTOMY FUSION 1 LEVEL;  Surgeon: Cristi Loron, MD;  Location: [facility] NEURO ORS;  Service: Neurosurgery;  Laterality: N/A;  Cervical five-six anterior cervical decompression with fusion interbody prothesis plating and bonegraft   BACK SURGERY     EXTERNAL FIXATION LEG Right 08/10/2017   Procedure: EXTERNAL FIXATION ANKLE;  Surgeon: Donato Heinz, MD;  Location: [facility] ORS;  Service: Orthopedics;  Laterality: Right;   EXTERNAL FIXATION REMOVAL Right 08/18/2017   Procedure: REMOVAL EXTERNAL FIXATION LEG;  Surgeon: Myrene Galas, MD;  Location: [facility] OR;  Service: Orthopedics;  Laterality: Right;   EYE SURGERY     HERNIA REPAIR     IR FL GUIDED LOC OF NEEDLE/CATH TIP FOR SPINAL INJECTION LT  12/09/2021   IR FL GUIDED LOC OF NEEDLE/CATH TIP FOR SPINAL INJECTION RT  12/09/2021   JOINT REPLACEMENT     bil knees   ORIF ANKLE FRACTURE Right 08/18/2017   Procedure: OPEN REDUCTION INTERNAL FIXATION (ORIF) ANKLE FRACTURE;  Surgeon: Myrene Galas, MD;  Location: [facility] OR;  Service: Orthopedics;  Laterality: Right;   REPLACEMENT TOTAL KNEE BILATERAL      Medications Prior to Admission  Medication Sig Dispense Refill Last Dose/Taking   albuterol (VENTOLIN HFA) 108 (90 Base) MCG/ACT inhaler INHALE 2 INHALATIONS BY MOUTH  EVERY 4 HOURS AS NEEDED FOR  WHEEZING OR FOR SHORTNESS OF  BREATH 51 g 2 Taking   atorvastatin (LIPITOR) 40 MG tablet TAKE 1 TABLET  BY MOUTH AT  BEDTIME 90 tablet 3 03/24/2023   buPROPion (WELLBUTRIN XL) 150 MG 24 hr tablet Take 1 tablet (150 mg total) by mouth daily. 100 tablet 1 03/24/2023   citalopram (CELEXA) 40 MG tablet TAKE 1 TABLET BY MOUTH DAILY 100 tablet 1 03/24/2023   clopidogrel (PLAVIX) 75 MG tablet TAKE ONE TABLET BY MOUTH DAILY AT 9AM 100 tablet 11 03/24/2023   cyclobenzaprine (FLEXERIL) 10 MG tablet Take 10 mg by mouth 3 (three) times daily.   03/24/2023    fluticasone (FLONASE) 50 MCG/ACT nasal spray Place 2 sprays into both nostrils daily. Use for 4-6 weeks then stop and use seasonally or as needed. 16 g 0 03/24/2023   gabapentin (NEURONTIN) 600 MG tablet TAKE 1 TABLET BY MOUTH 4 TIMES  DAILY 400 tablet 2 03/24/2023   MOUNJARO 10 MG/0.5ML Pen INJECT SUBCUTANEOUSLY 10 MG ONCE WEEKLY 6 mL 3 03/21/2023   mupirocin ointment (BACTROBAN) 2 % Apply 1 Application topically 2 (two) times daily. For 10 days 22 g 0 Taking   nystatin (MYCOSTATIN/NYSTOP) powder APPLY 1 APPLICATION TOPICALLY THREE TIMES DAILY AS NEEDED FOR YEAST RASH INTERTRIGO 30 g 11 Taking   ondansetron (ZOFRAN-ODT) 4 MG disintegrating tablet Take 1 tablet (4 mg total) by mouth every 8 (eight) hours as needed for nausea or vomiting. 30 tablet 2 Taking As Needed   oxyCODONE-acetaminophen (PERCOCET) 10-325 MG tablet Take 1 tablet by mouth every 8 (eight) hours as needed for pain. 90 tablet 0 03/23/2023   pantoprazole (PROTONIX) 40 MG tablet TAKE 1 TABLET BY MOUTH DAILY AT  9 AM 100 tablet 0 03/24/2023   QUEtiapine (SEROQUEL) 100 MG tablet TAKE 1 TABLET BY MOUTH DAILY AT  9 PM AT BEDTIME 100 tablet 2 03/24/2023   topiramate (TOPAMAX) 100 MG tablet TAKE 1 TABLET BY MOUTH TWICE  DAILY 200 tablet 2 03/24/2023   TRELEGY ELLIPTA 100-62.5-25 MCG/ACT AEPB USE 1 INHALATION BY MOUTH ONCE  DAILY AT THE SAME TIME EACH DAY 180 each 3 03/24/2023   trimethoprim-polymyxin b (POLYTRIM) ophthalmic solution Place 1 drop into both eyes 4 (four) times daily. For 7-10 days 10 mL 0 Taking   VITAMIN D PO Take by mouth.   03/18/2023   Vitamin D, Ergocalciferol, (DRISDOL) 1.25 MG (50000 UNIT) CAPS capsule TAKE 1 CAPSULE BY MOUTH EVERY WEDNESDAY @9AM  4 capsule 11 03/18/2023   azelastine (ASTELIN) 0.1 % nasal spray Place 2 sprays into both nostrils 2 (two) times daily. (Patient not taking: Reported on 03/25/2023) 30 mL 12 Not Taking   fluconazole (DIFLUCAN) 150 MG tablet Take one tablet by mouth on Day 1. Repeat dose 2nd tablet on Day 3. 2  tablet 0    levofloxacin (LEVAQUIN) 500 MG tablet Take 1 tablet (500 mg total) by mouth daily. For 7 days 7 tablet 0    metFORMIN (GLUCOPHAGE) 1000 MG tablet TAKE 1 TABLET BY MOUTH TWICE  DAILY WITH MEALS (Patient not taking: Reported on 03/02/2023) 200 tablet 2    midodrine (PROAMATINE) 5 MG tablet Take 1 tablet (5 mg total) by mouth 3 (three) times daily with meals. (Patient not taking: Reported on 03/02/2023) 21 tablet 0 Not Taking   Social History   Socioeconomic History   Marital status: Married    Spouse name: Not on file   Number of children: Not on file   Years of education: Not on file   Highest education level: 10th grade  Occupational History   Not on file  Tobacco Use   Smoking status: Every  Day    Current packs/day: 1.00    Average packs/day: 1 pack/day for 20.0 years (20.0 ttl pk-yrs)    Types: Cigarettes   Smokeless tobacco: Never  Vaping Use   Vaping status: Never Used  Substance and Sexual Activity   Alcohol use: No   Drug use: Yes    Types: Marijuana, Oxycodone    Comment: last smoked 2 days ago  8/4   Sexual activity: Not on file  Other Topics Concern   Not on file  Social History Narrative   Not on file   Social Drivers of Health   Financial Resource Strain: Low Risk  (11/19/2022)   Overall Financial Resource Strain (CARDIA)    Difficulty of Paying Living Expenses: Not very hard  Food Insecurity: No Food Insecurity (03/26/2023)   Hunger Vital Sign    Worried About Running Out of Food in the Last Year: Never true    Ran Out of Food in the Last Year: Never true  Transportation Needs: No Transportation Needs (03/26/2023)   PRAPARE - Administrator, Civil Service (Medical): No    Lack of Transportation (Non-Medical): No  Physical Activity: Inactive (11/19/2022)   Exercise Vital Sign    Days of Exercise per Week: 0 days    Minutes of Exercise per Session: 0 min  Stress: Stress Concern Present (11/19/2022)   [employer] of Occupational  Health - Occupational Stress Questionnaire    Feeling of Stress : To some extent  Social Connections: Moderately Isolated (03/26/2023)   Social Connection and Isolation Panel [NHANES]    Frequency of Communication with Friends and Family: More than three times a week    Frequency of Social Gatherings with Friends and Family: Patient declined    Attends Religious Services: Never    Database administrator or Organizations: No    Attends Banker Meetings: Never    Marital Status: Married  Catering manager Violence: Not At Risk (03/26/2023)   Humiliation, Afraid, Rape, and Kick questionnaire    Fear of Current or Ex-Partner: No    Emotionally Abused: No    Physically Abused: No    Sexually Abused: No    Family History  Problem Relation Age of Onset   Diabetes Mother    Hypertension Mother    Hypertension Father      Vitals:   03/27/23 1600 03/27/23 1754 03/28/23 0353 03/28/23 0816  BP: (!) 142/70 118/70 95/63 109/69  Pulse: 78 81 74 79  Resp: 15  18 18   Temp:   97.6 F (36.4 C) 98 F (36.7 C)  TempSrc:      SpO2: 99% 100% 96% 96%  Weight:      Height:        PHYSICAL EXAM General: awake, well nourished, in no acute distress. HEENT: Normocephalic and atraumatic. Neck: No JVD.  Lungs: Normal respiratory effort. Clear bilaterally to auscultation. No wheezes, crackles, rhonchi.  Heart: HRRR. Normal S1 and S2 without gallops or murmurs.  Abdomen: Non-distended appearing.  Msk: Normal strength and tone for age. Extremities: Warm and well perfused. No clubbing, cyanosis. no edema.  Neuro: Alert and oriented X 3. Psych: Answers questions appropriately.   Labs: Basic Metabolic Panel: Recent Labs    03/26/23 0632 03/27/23 0912 03/28/23 0153  NA 135 139  --   K 3.6 3.6  --   CL 109 114*  --   CO2 19* 21*  --   GLUCOSE 107* 104*  --   BUN  17 11  --   CREATININE 0.62 0.54 0.53  CALCIUM 8.5* 8.5*  --    Liver Function Tests: Recent Labs    03/25/23 1822   AST 15  ALT 11  ALKPHOS 54  BILITOT 0.7  PROT 6.8  ALBUMIN 3.3*   No results for input(s): "LIPASE", "AMYLASE" in the last 72 hours. CBC: Recent Labs    03/25/23 1821 03/26/23 0632 03/27/23 0912  WBC 15.2* 11.7* 8.4  NEUTROABS 11.1*  --   --   HGB 15.0 12.6 12.0  HCT 45.0 38.2 36.4  MCV 88.8 89.3 89.7  PLT 389 261 267   Cardiac Enzymes: No results for input(s): "CKTOTAL", "CKMB", "CKMBINDEX", "TROPONINIHS" in the last 72 hours. BNP: No results for input(s): "BNP" in the last 72 hours. D-Dimer: No results for input(s): "DDIMER" in the last 72 hours. Hemoglobin A1C: No results for input(s): "HGBA1C" in the last 72 hours. Fasting Lipid Panel: No results for input(s): "CHOL", "HDL", "LDLCALC", "TRIG", "CHOLHDL", "LDLDIRECT" in the last 72 hours. Thyroid Function Tests: No results for input(s): "TSH", "T4TOTAL", "T3FREE", "THYROIDAB" in the last 72 hours.  Invalid input(s): "FREET3" Anemia Panel: No results for input(s): "VITAMINB12", "FOLATE", "FERRITIN", "TIBC", "IRON", "RETICCTPCT" in the last 72 hours.   Radiology: PERIPHERAL VASCULAR CATHETERIZATION Result Date: 03/27/2023 See surgical note for result.  DG Toe Great Left Result Date: 03/25/2023 CLINICAL DATA:  Infection EXAM: LEFT GREAT TOE COMPARISON:  None Available. FINDINGS: Ulcer or wound at the tip of the great toe. Osseous destructive change involving tuft of first distal phalanx. Mild degenerative change at the first MTP joint. No soft tissue emphysema IMPRESSION: Ulcer or wound at the tip of the great toe with osseous destructive change involving tuft of first distal phalanx consistent with osteomyelitis. Electronically Signed   By: Jasmine Pang M.D.   On: 03/25/2023 20:54    ECHO pending  TELEMETRY reviewed by me [facility]) 03/28/2023 : NSR  EKG reviewed by me: normal sinus rhythm  Data reviewed by me [facility]) 03/28/2023: last 24h vitals tele labs imaging I/O provider notes  Principal Problem:   Osteomyelitis  of left foot (HCC) Active Problems:   Hyperlipidemia   Tobacco abuse   COPD (chronic obstructive pulmonary disease) (HCC)   Anxiety and depression   History of cerebrovascular accident (CVA) with residual deficit   Obesity (BMI 30-39.9)   Type 2 diabetes mellitus with peripheral vascular disease (HCC)   PVD (peripheral vascular disease) (HCC)   HTN (hypertension)   Acute osteomyelitis of toe of left foot (HCC)    ASSESSMENT AND PLAN:   POC for left femoral endarterectomy followed by open angioplasty and stent placement of the popliteal PVD Osteomyelitis of left foot: Patient failed outpatient oral antibiotic treatment.  - osteo treatment per primary team - plan for surgery on Wednesday - echo ordered and pending, if normal, patient may proceed with surgery without further testing  -plavix on hold for possible surgery - Continue Lipitor   History of CVA - Lipitor - Plavix on hold for possible surgery   HTN -IV hydralazine as needed   Hyperlipidemia -on lipitor  Signed: Clotilde Dieter 03/28/2023, 12:35 PM [facility]

## 2023-03-29 ENCOUNTER — Inpatient Hospital Stay
Admit: 2023-03-29 | Discharge: 2023-03-29 | Disposition: A | Discharge status: Home / Self Care | Attending: Internal Medicine | Admitting: Internal Medicine

## 2023-03-29 DIAGNOSIS — M86472 Chronic osteomyelitis with draining sinus, left ankle and foot: Secondary | ICD-10-CM | POA: Diagnosis not present

## 2023-03-29 LAB — ECHOCARDIOGRAM COMPLETE
AR max vel: 2.02 cm2
AV Area VTI: 2.09 cm2
AV Area mean vel: 1.74 cm2
AV Mean grad: 3 mmHg
AV Peak grad: 5.2 mmHg
Ao pk vel: 1.14 m/s
Area-P 1/2: 3.83 cm2
Height: 65 in
MV VTI: 2.17 cm2
S' Lateral: 2.8 cm
Weight: 3352 [oz_av]

## 2023-03-29 LAB — GLUCOSE, CAPILLARY
Glucose-Capillary: 106 mg/dL — ABNORMAL HIGH (ref 70–99)
Glucose-Capillary: 142 mg/dL — ABNORMAL HIGH (ref 70–99)
Glucose-Capillary: 130 mg/dL — ABNORMAL HIGH (ref 70–99)
Glucose-Capillary: 173 mg/dL — ABNORMAL HIGH (ref 70–99)

## 2023-03-29 LAB — VANCOMYCIN, PEAK: Vancomycin Pk: 21 ug/mL — ABNORMAL LOW (ref 30–40)

## 2023-03-29 MED ORDER — LINEZOLID 600 MG/300ML IV SOLN
600.0000 mg | Freq: Two times a day (BID) | INTRAVENOUS | Status: DC
  Administered 2023-03-29 (×2): 600 mg via INTRAVENOUS
  Filled 2023-03-29 (×3): qty 300

## 2023-03-29 MED ORDER — PERFLUTREN LIPID MICROSPHERE
1.0000 mL | INTRAVENOUS | Status: AC | PRN
  Administered 2023-03-29: 2 mL via INTRAVENOUS

## 2023-03-29 NOTE — Progress Notes (Signed)
*  PRELIMINARY RESULTS* Echocardiogram 2D Echocardiogram has been performed.  [name] 03/29/2023, 10:21 AM

## 2023-03-29 NOTE — Progress Notes (Signed)
 Echocardiogram shows preserved LVEF and minimal VHD. Patient may proceed with surgery without additional cardiac testing.  Cardiology will sign off at this time, please reach out for any questions or concerns.    Rozell Searing Abrahm Mancia, DO 11:39 AM 03/29/23

## 2023-03-29 NOTE — Plan of Care (Signed)

## 2023-03-29 NOTE — Progress Notes (Signed)
 Progress Note   Patient: [name] ZOX:[id number] DOB: 01/28/1965 DOA: 03/25/2023     4 DOS: the patient was seen and examined on 03/29/2023   Brief hospital course:  [name] is a 58 y.o. female with medical history significant of osteomyelitis of left great toe, HTN, HLD, DM, COPD, P:VD, stroke with right-sided weakness,, depression with anxiety, migraine, kidney stone, diverticulitis, tobacco abuse, who presents with left foot pain.   Patient was recently hospitalized to [facility] due to left great toe osteomyelitis. She was discharged home on oral antibiotics of doxycycline and Cipro, with plan for outpatient follow-up with vascular surgery. She has finished a course of antibiotics today, however she has worsening left great toe pain, which is constant, sharp, severe, nonradiating.  Left great toe is erythematous and swelling.  She also developed chills and fever of 100.6 today.  No nausea, vomiting, diarrhea or abdominal pain.  Patient is constipated.  No cough, SOB and chest pain. No symptoms of UTI. It is unable to palpate DP pulses bilaterally,  but was able to obtain biphasic Doppler signals per EDP, Dr, Larinda Buttery.   MRI-left lower extremity on 03/12/23: Distal great toe soft tissue ulcer with active osteomyelitis of the great toe distal phalanx. Abnormal signal involves the entire distal phalanx.  No evidence for soft tissue abscess.    Data reviewed independently and ED Course: pt was found to have WBC 15.2, lactic acid 2.0, GFR> 60, INR 1.1, PTT 28.  Temperature 98.2, blood pressure 117/76, heart rate 112, 96, RR 18, oxygen saturation 100% on room air.  Patient is admitted to MedSurg bed as inpatient. Consulted Dr. Gilda Crease of [facility]VS and Dr. Annamary Rummage of podiatry.   X-ray of left great toe: Ulcer or wound at the tip of the great toe with osseous destructive change involving tuft of first distal phalanx consistent with osteomyelitis.     EKG: I have personally reviewed.   Sinus rhythm, QTc 443, low voltage, borderline left axis deviation  Assessment and Plan:  Osteomyelitis of left foot [name]):  Known peripheral arterial disease Patient failed outpatient oral antibiotic treatment and recently completed a course of doxycycline and ciprofloxacin.   Patient had WBC 15.2, lactic acid of 2.0, reporting subjective fever, but her temperature was normal in ED ( 98.2)  Leukocytosis has normalized Continue empiric antibiotic therapy with, patient was initially on vancomycin and Zosyn.  Will discontinue vancomycin and place patient on Zyvox. Podiatry has been consulted with plans to amputate the left great toe following patient's vascular surgery procedure, patient is scheduled for left femoral endarterectomy and stenting of the popliteal artery planned for 03/19 Vascular surgery has requested cardiology consult for preoperative evaluation prior to vascular procedure, patient has been cleared by cardiology to proceed with surgery without additional cardiac testing. Plavix was placed on hold for possible procedure Continue atorvastatin       Type 2 diabetes mellitus with peripheral vascular disease (HCC):  Recent A1c 5.2 Hold metformin and Mounjaro  Continue sliding scale insulin Maintain consistent carbohydrate diet      History of cerebrovascular accident (CVA) with residual deficit -Lipitor -Hold Plavix for possible surgery     HTN (hypertension):  Patient with relative hypotension but asymptomatic Continue midodrine 5 mg 3 times daily       COPD (chronic obstructive pulmonary disease) (HCC): Not acutely exacerbated -Bronchodilators and as needed Mucinex     Tobacco abuse -Nicotine patch     Anxiety and depression -Continue Wellbutrin, Celexa, Seroquel,  Obesity: Class II BMI 38.19 Lifestyle modification and exercise has been discussed with patient in detail         Subjective: Patient is seen and examined at the bedside.  She has no  new complaints.  Physical Exam: Vitals:   03/28/23 1404 03/28/23 2125 03/29/23 0432 03/29/23 0735  BP: 93/66 116/71 99/72 103/68  Pulse: 79 78 76 77  Resp: 16  18 14   Temp: 98.7 F (37.1 C)  97.8 F (36.6 C) 98.4 F (36.9 C)  TempSrc:   Oral   SpO2: 96% 100% 100% 96%  Weight:      Height:       General: Not in acute distress HEENT:       Eyes: PERRL, EOMI, no jaundice       ENT: No discharge from the ears and nose, no pharynx injection, no tonsillar enlargement.        Neck: No JVD, no bruit, no mass felt. Heme: No neck lymph node enlargement. Cardiac: S1/S2, RRR, No murmurs, No gallops or rubs. Respiratory: No rales, wheezing, rhonchi or rubs. GI: Soft, nondistended, nontender, no rebound pain, no organomegaly, BS present. GU: No hematuria Ext: No pitting leg edema bilaterally.  Has erythema, tenderness, swelling, warmth, scabbed ulcer in right great toe    Musculoskeletal: No joint deformities, No joint redness or warmth, no limitation of ROM in spin. Skin: No rashes.  Neuro: Alert, oriented X3, cranial nerves II-XII grossly intact, has chronic residual right-sided weakness from previous stroke   Psych: Patient is not psychotic, no suicidal or homicidal ideation.    Data Reviewed:  There are no new results to review at this time.  Family Communication: Plan of care discussed with patient in detail.  She verbalizes understanding and agrees with the plan.  Disposition: Status is: Inpatient Remains inpatient appropriate because: For femoral endarterectomy on 3/19  Planned Discharge Destination:  TBD    Time spent: 32 minutes  Author: Lucile Shutters, MD 03/29/2023 12:27 PM  For on call review [email or web address].

## 2023-03-29 NOTE — Consult Note (Addendum)
 Pharmacy Antibiotic Note  [name] is a 58 y.o. female admitted on 03/25/2023 with  Left foot osteomyelitis .  Pharmacy has been consulted for pip/tazo and vancomycin dosing. Imaging of foot pending. Afeb, WBC 15, pt was complaining of a fever per notes. She has a wound on left great toe, at [facility] found to have OM and was started on abx. She was sent home on doxy + cipro. Cannot find any culture data for OM.  No weight in chart so I used previous weight listed in the chart, 95 kg from the end of February.   3/14 update: Patient is planned to undergo left great toe amputation either today pending vascular plans, but procedure may happen 3/17 instead. In that case, antibiotics will be continuing through weekend.  Plan: Continue Zosyn 3.375 g IV q8H Continue vancomycin 1000 mg IV q12H eAUC 480, Cmin 14, Scr 0.8, Vd 0.82, IBW Will order levels after tonight's dose, which will be 4th maintenance dose. Half-life for vancomycin with patient's current renal function is an estimated 11-12 hours.  3/16:  UPDATE:   Vanc 1 gm IV started on 3/15 @ 2150.  Pt lost IV access during administration.  RN estimates pt only received ~ 35 % of Vanc dose.          -  IV access regained around 0130; Per RN,  Vanc 1 gm given on 3/16 @ 0124.   Will retime vanc peak and trough accordingly.   Follow up culture results to assess for antibiotic optimization. Continue to monitor renal function to assess for any necessary antibiotic dosing changes.  Height: 5\' 5"  (165.1 cm) Weight: 95 kg (209 lb 8 oz) IBW/kg (Calculated) : 57  Temp (24hrs), Avg:98.1 F (36.7 C), Min:97.6 F (36.4 C), Max:98.7 F (37.1 C)  Recent Labs  Lab 03/25/23 1821 03/25/23 1822 03/25/23 1956 03/26/23 0632 03/27/23 0912 03/28/23 0153 03/28/23 0936  WBC 15.2*  --   --  11.7* 8.4  --   --   CREATININE  --  0.70  --  0.62 0.54 0.53  --   LATICACIDVEN 2.0*  --  1.5  --   --   --   --   VANCOTROUGH  --   --   --   --   --    --  16  VANCOPEAK  --   --   --   --   --  22*  --     Estimated Creatinine Clearance: 86.3 mL/min (by C-G formula based on SCr of 0.53 mg/dL).    Allergies  Allergen Reactions   Tramadol Nausea And Vomiting and Hypertension   Augmentin [Amoxicillin-Pot Clavulanate] Rash    Tolerated Zosyn 3/13    Antimicrobials this admission: 3/12 pip/tazo >>  3/12 vancomycin >>   Dose adjustments this admission: None  Microbiology results: 3/12 BCx: [id number]   Thank you for allowing pharmacy to be a part of this patient's care.  Will M. Dareen Piano, PharmD Clinical Pharmacist 03/29/2023 2:23 AM

## 2023-03-30 ENCOUNTER — Encounter
Admission: EM | Disposition: A | Discharge status: Home / Self Care | Payer: Self-pay | Source: Home / Self Care | Attending: Internal Medicine

## 2023-03-30 ENCOUNTER — Encounter: Payer: Self-pay | Admitting: Vascular Surgery

## 2023-03-30 ENCOUNTER — Other Ambulatory Visit: Payer: Self-pay

## 2023-03-30 DIAGNOSIS — M86172 Other acute osteomyelitis, left ankle and foot: Secondary | ICD-10-CM | POA: Diagnosis not present

## 2023-03-30 LAB — CULTURE, BLOOD (ROUTINE X 2)
Culture: NO GROWTH
Culture: NO GROWTH
Special Requests: ADEQUATE

## 2023-03-30 LAB — GLUCOSE, CAPILLARY
Glucose-Capillary: 112 mg/dL — ABNORMAL HIGH (ref 70–99)
Glucose-Capillary: 132 mg/dL — ABNORMAL HIGH (ref 70–99)
Glucose-Capillary: 149 mg/dL — ABNORMAL HIGH (ref 70–99)
Glucose-Capillary: 166 mg/dL — ABNORMAL HIGH (ref 70–99)

## 2023-03-30 SURGERY — AMPUTATION, TOE
Anesthesia: Choice | Site: Toe | Laterality: Left

## 2023-03-30 MED ORDER — HEPARIN (PORCINE) IN NACL 1000-0.9 UT/500ML-% IV SOLN
INTRAVENOUS | Status: DC | PRN
  Administered 2023-03-27: 1000 mL

## 2023-03-30 MED ORDER — ENSURE ENLIVE PO LIQD
237.0000 mL | Freq: Two times a day (BID) | ORAL | Status: DC
  Administered 2023-03-30 – 2023-04-04 (×8): 237 mL via ORAL

## 2023-03-30 MED ORDER — IODIXANOL 320 MG/ML IV SOLN
INTRAVENOUS | Status: DC | PRN
  Administered 2023-03-27: 30 mL

## 2023-03-30 MED ORDER — LIDOCAINE HCL (PF) 1 % IJ SOLN
INTRAMUSCULAR | Status: DC | PRN
  Administered 2023-03-27: 10 mL

## 2023-03-30 MED ORDER — LINEZOLID 600 MG PO TABS: 600.0000 mg | ORAL_TABLET | Freq: Two times a day (BID) | ORAL | Status: DC

## 2023-03-30 MED ORDER — VANCOMYCIN HCL IN DEXTROSE 1-5 GM/200ML-% IV SOLN
1000.0000 mg | Freq: Two times a day (BID) | INTRAVENOUS | Status: DC
  Administered 2023-03-30 – 2023-04-03 (×8): 1000 mg via INTRAVENOUS
  Filled 2023-03-30 (×8): qty 200

## 2023-03-30 NOTE — Progress Notes (Signed)
 Progress Note    03/30/2023 2:22 PM 3 Days Post-Op  Subjective:  [name] is a 58 y.o. female with medical history significant of osteomyelitis of left great toe, HTN, HLD, DM, COPD, P:VD, stroke with right-sided weakness,, depression with anxiety, migraine, kidney stone, diverticulitis, tobacco abuse, is now POD #3 from left lower extremity angiogram. No intervention was preformed at the time of angiogram. Patient will need left femoral endarterectomy with stenting of her left popliteal artery.    Vitals:   03/30/23 0317 03/30/23 0756  BP: 98/62 98/65  Pulse: 90 79  Resp: 18 16  Temp: 97.7 F (36.5 C) (!) 97.5 F (36.4 C)  SpO2: 95% 98%   Physical Exam: Cardiac:  RRR, Normal S1, S2. No murmurs appreciated.  Lungs:  Clear throughout on auscultation. No rales, rhonchi or wheezing to note.  Incisions:  right groin puncture site with dressing clean dry and intact. No hematoma, seroma to note.  Extremities:  Palpable pulses throughout except unable to palpate or doppler left Dorsalis pedis pulse. Left great toe with open sore and necrotic tip.  Abdomen:  Positive bowel sounds throughout, Soft non tender and non distended.  Neurologic: AAOX3, Answers all questions and follows commands appropriately.   CBC    Component Value Date/Time   WBC 8.4 03/27/2023 0912   RBC 4.06 03/27/2023 0912   HGB 12.0 03/27/2023 0912   HGB 14.6 09/22/2013 2251   HCT 36.4 03/27/2023 0912   HCT 44.3 09/22/2013 2251   PLT 267 03/27/2023 0912   PLT 300 09/22/2013 2251   MCV 89.7 03/27/2023 0912   MCV 90 09/22/2013 2251   MCH 29.6 03/27/2023 0912   MCHC 33.0 03/27/2023 0912   RDW 13.5 03/27/2023 0912   RDW 13.8 09/22/2013 2251   LYMPHSABS 3.0 03/25/2023 1821   LYMPHSABS 4.5 (H) 08/02/2013 2118   MONOABS 0.9 03/25/2023 1821   MONOABS 1.3 (H) 08/02/2013 2118   EOSABS 0.0 03/25/2023 1821   EOSABS 0.1 08/02/2013 2118   BASOSABS 0.0 03/25/2023 1821   BASOSABS 0.0 08/02/2013 2118    BMET     Component Value Date/Time   NA 139 03/27/2023 0912   NA 135 (L) 09/22/2013 2251   K 3.6 03/27/2023 0912   K 3.8 09/22/2013 2251   CL 114 (H) 03/27/2023 0912   CL 100 09/22/2013 2251   CO2 21 (L) 03/27/2023 0912   CO2 27 09/22/2013 2251   GLUCOSE 104 (H) 03/27/2023 0912   GLUCOSE 335 (H) 09/22/2013 2251   BUN 11 03/27/2023 0912   BUN 11 09/22/2013 2251   CREATININE 0.53 03/28/2023 0153   CREATININE 0.62 05/15/2021 1351   CALCIUM 8.5 (L) 03/27/2023 0912   CALCIUM 8.3 (L) 09/22/2013 2251   GFRNONAA >60 03/28/2023 0153   GFRNONAA >60 09/22/2013 2251   GFRAA >60 08/18/2017 0707   GFRAA >60 09/22/2013 2251    INR    Component Value Date/Time   INR 1.1 03/25/2023 1822   INR 0.9 08/28/2012 1927     Intake/Output Summary (Last 24 hours) at 03/30/2023 1422 Last data filed at 03/30/2023 1100 Gross per 24 hour  Intake 433.05 ml  Output 500 ml  Net -66.95 ml     Assessment/Plan:  58 y.o. female is s/p from left lower extremity angiogram. No intervention was preformed at the time of angiogram. 3 Days Post-Op   PLAN Left Femoral Endarterectomy with stent placement to the popliteal artery on Wednesday 04/01/23.  I had a very long detailed discussion  this afternoon with the patient and family members at the bedside regarding the procedure, benefits, risk, and complications.  Patient and her family verbalized her understanding and wished to proceed with surgery on Wednesday, 04/01/2023.  I answered all the patient's questions this afternoon.  Patient will be made n.p.o. at midnight prior to surgery on 04/01/2023.  I discussed the case in detail with Dr. Levora Dredge MD and he agrees with plan.   DVT prophylaxis: Heparin 5000 units subcu every 8 hours   Marcie [facility] 03/30/2023 2:22 PM

## 2023-03-30 NOTE — Progress Notes (Signed)
 VAST consult received to obtain IV access. Upon arrival to patient's room, patient eating breakfast. Discussed plan of care and possible midline placement. Pt has right arm deficits from previous stroke. VAST RN will return to assess vasculature in left arm.

## 2023-03-30 NOTE — Progress Notes (Signed)
 Progress Note   Patient: [name] ZOX:[id number] DOB: 20-Jul-1965 DOA: 03/25/2023     5 DOS: the patient was seen and examined on 03/30/2023   Brief hospital course:  [name] is a 58 y.o. female with medical history significant of osteomyelitis of left great toe, HTN, HLD, DM, COPD, P:VD, stroke with right-sided weakness,, depression with anxiety, migraine, kidney stone, diverticulitis, tobacco abuse, who presents with left foot pain.   Patient was recently hospitalized to [facility] due to left great toe osteomyelitis. She was discharged home on oral antibiotics of doxycycline and Cipro, with plan for outpatient follow-up with vascular surgery. She has finished a course of antibiotics today, however she has worsening left great toe pain, which is constant, sharp, severe, nonradiating.  Left great toe is erythematous and swelling.  She also developed chills and fever of 100.6 today.  No nausea, vomiting, diarrhea or abdominal pain.  Patient is constipated.  No cough, SOB and chest pain. No symptoms of UTI. It is unable to palpate DP pulses bilaterally,  but was able to obtain biphasic Doppler signals per EDP, Dr, Larinda Buttery.   MRI-left lower extremity on 03/12/23: Distal great toe soft tissue ulcer with active osteomyelitis of the great toe distal phalanx. Abnormal signal involves the entire distal phalanx.  No evidence for soft tissue abscess.    Data reviewed independently and ED Course: pt was found to have WBC 15.2, lactic acid 2.0, GFR> 60, INR 1.1, PTT 28.  Temperature 98.2, blood pressure 117/76, heart rate 112, 96, RR 18, oxygen saturation 100% on room air.  Patient is admitted to MedSurg bed as inpatient. Consulted Dr. Gilda Crease of [facility] and Dr. Annamary Rummage of podiatry.   X-ray of left great toe: Ulcer or wound at the tip of the great toe with osseous destructive change involving tuft of first distal phalanx consistent with osteomyelitis.     EKG: I have personally reviewed.   Sinus rhythm, QTc 443, low voltage, borderline left axis deviation   Assessment and Plan:  Osteomyelitis of left foot [facility]):  Patient with known peripheral arterial disease Patient failed outpatient oral antibiotic treatment and recently completed a course of doxycycline and ciprofloxacin.   Patient had WBC 15.2, lactic acid of 2.0, reporting subjective fever, but her temperature was normal in ED ( 98.2)  Leukocytosis has normalized Continue empiric antibiotic therapy with, patient was initially on vancomycin and Zosyn.  Zyvox was discontinued due to multiple drug interactions Podiatry has been consulted with plans to amputate the left great toe on 03/20 following patient's vascular surgery procedure, patient is scheduled for left femoral endarterectomy and stenting of the popliteal artery planned for 03/19 Vascular surgery had requested cardiology consult for preoperative evaluation prior to vascular procedure, patient has been cleared by cardiology to proceed with surgery without additional cardiac testing. Plavix was placed on hold for possible procedure Continue atorvastatin       Type 2 diabetes mellitus with peripheral vascular disease (HCC):  Recent A1c 5.2 Hold metformin and Mounjaro  Continue sliding scale insulin Maintain consistent carbohydrate diet       History of cerebrovascular accident (CVA) with residual deficit -Lipitor -Hold Plavix for possible surgery     HTN (hypertension):  Patient with relative hypotension but asymptomatic Continue midodrine 5 mg 3 times daily       COPD (chronic obstructive pulmonary disease) (HCC): Not acutely exacerbated -Bronchodilators and as needed Mucinex     Tobacco abuse -Nicotine patch     Anxiety and  depression -Continue Wellbutrin, Celexa, Seroquel,     Obesity: Class II BMI 38.19 Lifestyle modification and exercise has been discussed with patient in detail          Subjective: No new complaints  Physical  Exam: Vitals:   03/29/23 1732 03/29/23 2049 03/30/23 0317 03/30/23 0756  BP: (!) 140/81 (!) 146/82 98/62 98/65   Pulse: 83 87 90 79  Resp: 19 18 18 16   Temp: 98.5 F (36.9 C) 98.4 F (36.9 C) 97.7 F (36.5 C) (!) 97.5 F (36.4 C)  TempSrc: Oral Oral    SpO2: 99% 95% 95% 98%  Weight:      Height:       General: Not in acute distress HEENT:       Eyes: PERRL, EOMI, no jaundice       ENT: No discharge from the ears and nose, no pharynx injection, no tonsillar enlargement.        Neck: No JVD, no bruit, no mass felt. Heme: No neck lymph node enlargement. Cardiac: S1/S2, RRR, No murmurs, No gallops or rubs. Respiratory: No rales, wheezing, rhonchi or rubs. GI: Soft, nondistended, nontender, no rebound pain, no organomegaly, BS present. GU: No hematuria Ext: No pitting leg edema bilaterally.  Has erythema, tenderness, swelling, warmth, scabbed ulcer in right great toe    Musculoskeletal: No joint deformities, No joint redness or warmth, no limitation of ROM in spin. Skin: No rashes.  Neuro: Alert, oriented X3, cranial nerves II-XII grossly intact, has chronic residual right-sided weakness from previous stroke   Psych: Patient is not psychotic, no suicidal or homicidal ideation.   Data Reviewed:  There are no new results to review at this time.  Family Communication: Plan of care discussed with patient in detail.  She verbalizes understanding and agrees with the plan  Disposition: Status is: Inpatient Remains inpatient appropriate because: For left femoral endarterectomy and amputation of left great toe on 03/19 and 03/20  Planned Discharge Destination:  TBD    Time spent: 33 minutes  Author: Lucile Shutters, MD 03/30/2023 1:42 PM  For on call review [email or web address].

## 2023-03-30 NOTE — Care Management Important Message (Signed)
 Important Message  Patient Details  Name: [name] MRN: [id number] Date of Birth: 08-Aug-1965   Important Message Given:  Yes - Medicare IM     Cristela Blue, CMA 03/30/2023, 12:07 PM

## 2023-03-30 NOTE — Progress Notes (Signed)
 VAST consult received to obtain IV access. Pt with right arm restriction d/t deficits from previous stroke. Assessed entire left arm utilizing ultrasound. Attempted to place midline in cephalic vein, but was unsuccessful d/t vein size/constriction. Able to place a 22G IV in left antecubital, but it is positional. PICC recommendation d/t poor vasculature sent to Agbata, MD. Pt scheduled for vascular surgery on Wednesday followed by amputation on Thursday. Agbata, MD responded will hold off for now on PICC placement.

## 2023-03-30 NOTE — Plan of Care (Signed)

## 2023-03-30 NOTE — Consult Note (Signed)
 Pharmacy Antibiotic Note  [name] is a 58 y.o. female admitted on 03/25/2023 with  Left foot osteomyelitis .  Pharmacy has been consulted for pip/tazo and vancomycin dosing. Imaging of foot pending. Afeb, WBC 15, pt was complaining of a fever per notes. She has a wound on left great toe, at [facility] found to have OM and was started on abx. She was sent home on doxy + cipro. Cannot find any culture data for OM.  3/17 update: Patient is planned to undergo left great toe amputation 3/20.  Linezolid discontinued due to multiple drug interactions and Vancomycin resumed 3/17  Plan: Continue Zosyn 3.375 g IV q8H Resume vancomycin 1000 mg IV q12H eAUC 480, Cmin 14, Scr 0.8(actual 0.54) Vd 0.82, IBW  Follow up culture results to assess for antibiotic optimization. Continue to monitor renal function to assess for any necessary antibiotic dosing changes.  Height: 5\' 5"  (165.1 cm) Weight: 95 kg (209 lb 8 oz) IBW/kg (Calculated) : 57  Temp (24hrs), Avg:98 F (36.7 C), Min:97.5 F (36.4 C), Max:98.5 F (36.9 C)  Recent Labs  Lab 03/25/23 1821 03/25/23 1822 03/25/23 1956 03/26/23 0632 03/27/23 0912 03/28/23 0153 03/28/23 0936 03/29/23 0403  WBC 15.2*  --   --  11.7* 8.4  --   --   --   CREATININE  --  0.70  --  0.62 0.54 0.53  --   --   LATICACIDVEN 2.0*  --  1.5  --   --   --   --   --   VANCOTROUGH  --   --   --   --   --   --  16  --   VANCOPEAK  --   --   --   --   --  22*  --  21*    Estimated Creatinine Clearance: 86.3 mL/min (by C-G formula based on SCr of 0.53 mg/dL).    Allergies  Allergen Reactions   Tramadol Nausea And Vomiting and Hypertension   Augmentin [Amoxicillin-Pot Clavulanate] Rash    Tolerated Zosyn 3/13    Antimicrobials this admission: 3/12 pip/tazo >>  3/12 vancomycin >>3/15,  resumed 3/17 >> Linezolid 3/16 x 2 doses  Dose adjustments this admission: None  Microbiology results: 3/12 BCx: [id number]  Thank you for allowing pharmacy to be a  part of this patient's care.  Bari Mantis PharmD Clinical Pharmacist 03/30/2023

## 2023-03-30 NOTE — Progress Notes (Signed)
   PODIATRY PROGRESS NOTE Patient Name: SALEM [name]  DOB Mar 30, 1965 DOA 03/25/2023  Hospital Day: 6  Assessment:  58 y.o. female with PMH hypertension, hyperlipidemia, DM, COPD, PAD, history of stroke with right-sided weakness with Left hallux osteomyelitis and PAD.  AF, VSS  WBC:  8.4 ESR/CRP: 15/0.6    Imaging: Left foot radiographs IMPRESSION: Ulcer or wound at the tip of the great toe with osseous destructive change involving tuft of first distal phalanx consistent with osteomyelitis.  Plan:  - Plan to proceed with amputation of L hallux 04/02/23 at 1000. Please keep NPO after MN Thursday. -Patient underwent angiogram on 3/14 which did not demonstrate 70 to 75% stenosis through the left common femoral artery, severe disease of the popliteal artery proximally.  Vascular planning left femoral endarterectomy and popliteal stenting on 3/19.  Fortunately the patient does have three-vessel runoff to the foot -Ulceration appears stable at this time.  Given the extent of her vascular disease and upcoming plans for intervention, plan to proceed with left first toe amputation following vascular intervention. -Weightbearing as tolerated in surgical shoe -Continue IV antibiotics Will continue to follow  Subjective:  Patient seen resting comfortably at bedside.  No acute distress. Discussed plan for amputation of the left great toe on Thursday, she is aware and agrees to proceed all questions answered.   Objective:   Vitals:   03/30/23 0317 03/30/23 0756  BP: 98/62 98/65  Pulse: 90 79  Resp: 18 16  Temp: 97.7 F (36.5 C) (!) 97.5 F (36.4 C)  SpO2: 95% 98%       Latest Ref Rng & Units 03/27/2023    9:12 AM 03/26/2023    6:32 AM 03/25/2023    6:21 PM  CBC  WBC 4.0 - 10.5 K/uL 8.4  11.7  15.2   Hemoglobin 12.0 - 15.0 g/dL 40.9  81.1  91.4   Hematocrit 36.0 - 46.0 % 36.4  38.2  45.0   Platelets 150 - 400 K/uL 267  261  389        Latest Ref Rng & Units 03/28/2023    1:53 AM  03/27/2023    9:12 AM 03/26/2023    6:32 AM  BMP  Glucose 70 - 99 mg/dL  782  956   BUN 6 - 20 mg/dL  11  17   Creatinine 2.13 - 1.00 mg/dL 0.86  5.78  4.69   Sodium 135 - 145 mmol/L  139  135   Potassium 3.5 - 5.1 mmol/L  3.6  3.6   Chloride 98 - 111 mmol/L  114  109   CO2 22 - 32 mmol/L  21  19   Calcium 8.9 - 10.3 mg/dL  8.5  8.5     General: AAOx3, NAD  Lower Extremity Exam Vasc: Nonpalpable DP and PT.  Dry to the first toe.  Capillary refill 3 to 5 seconds to digits  Derm: Eschar present left distal first toe, stable appearing.  No erythema Prophylactic bordered foam applied to the right heel  MSK:  Manual muscle strength testing 5/5 for dorsiflexion, plantarflexion, inversion and eversion.  No symptomatic limitations in joint range of motion.  Neuro: Epicritic sensation grossly   Radiology:  Results reviewed. See assessment for pertinent imaging results

## 2023-03-31 ENCOUNTER — Encounter: Payer: Self-pay | Admitting: Family Medicine

## 2023-03-31 DIAGNOSIS — M86172 Other acute osteomyelitis, left ankle and foot: Secondary | ICD-10-CM | POA: Diagnosis not present

## 2023-03-31 LAB — GLUCOSE, CAPILLARY
Glucose-Capillary: 112 mg/dL — ABNORMAL HIGH (ref 70–99)
Glucose-Capillary: 120 mg/dL — ABNORMAL HIGH (ref 70–99)
Glucose-Capillary: 151 mg/dL — ABNORMAL HIGH (ref 70–99)
Glucose-Capillary: 133 mg/dL — ABNORMAL HIGH (ref 70–99)

## 2023-03-31 LAB — CREATININE, SERUM
Creatinine, Ser: 0.69 mg/dL (ref 0.44–1.00)
GFR, Estimated: >60 mL/min (ref >60)

## 2023-03-31 LAB — TYPE AND SCREEN
ABO/RH(D): A POS
Antibody Screen: NEGATIVE

## 2023-03-31 NOTE — Plan of Care (Signed)

## 2023-03-31 NOTE — Progress Notes (Signed)
 Progress Note   Patient: [name] JXB:[id number] DOB: 01-16-1965 DOA: 03/25/2023     6 DOS: the patient was seen and examined on 03/31/2023   Brief hospital course:  RONAN DION is a 58 y.o. female with medical history significant of osteomyelitis of left great toe, HTN, HLD, DM, COPD, P:VD, stroke with right-sided weakness,, depression with anxiety, migraine, kidney stone, diverticulitis, tobacco abuse, who presents with left foot pain.   Patient was recently hospitalized to [facility] due to left great toe osteomyelitis. She was discharged home on oral antibiotics of doxycycline and Cipro, with plan for outpatient follow-up with vascular surgery. She has finished a course of antibiotics today, however she has worsening left great toe pain, which is constant, sharp, severe, nonradiating.  Left great toe is erythematous and swelling.  She also developed chills and fever of 100.6 today.  No nausea, vomiting, diarrhea or abdominal pain.  Patient is constipated.  No cough, SOB and chest pain. No symptoms of UTI. It is unable to palpate DP pulses bilaterally,  but was able to obtain biphasic Doppler signals per EDP, Dr, Larinda Buttery.   MRI-left lower extremity on 03/12/23: Distal great toe soft tissue ulcer with active osteomyelitis of the great toe distal phalanx. Abnormal signal involves the entire distal phalanx.  No evidence for soft tissue abscess.    Data reviewed independently and ED Course: pt was found to have WBC 15.2, lactic acid 2.0, GFR> 60, INR 1.1, PTT 28.  Temperature 98.2, blood pressure 117/76, heart rate 112, 96, RR 18, oxygen saturation 100% on room air.  Patient is admitted to MedSurg bed as inpatient. Consulted Dr. Gilda Crease of [facility] and Dr. Annamary Rummage of podiatry.   X-ray of left great toe: Ulcer or wound at the tip of the great toe with osseous destructive change involving tuft of first distal phalanx consistent with osteomyelitis.     EKG: I have personally reviewed.   Sinus rhythm, QTc 443, low voltage, borderline left axis deviation    Assessment and Plan:  Osteomyelitis of left foot [facility]):  Patient with known peripheral arterial disease Patient failed outpatient oral antibiotic treatment and recently completed a course of doxycycline and ciprofloxacin.   Patient had WBC 15.2, lactic acid of 2.0, reporting subjective fever, but her temperature was normal in ED ( 98.2)  Leukocytosis has normalized Continue empiric antibiotic therapy with vancomycin and Zosyn.   Podiatry has been consulted with plans to amputate the left great toe on 03/20 following patient's vascular surgery procedure, patient is scheduled for left femoral endarterectomy and stenting of the popliteal artery planned for 03/19 Vascular surgery had requested cardiology consult for preoperative evaluation prior to vascular procedure, patient has been cleared by cardiology to proceed with surgery without additional cardiac testing. Plavix was placed on hold for possible procedure Continue atorvastatin N.p.o. past midnight for planned procedure in a.m. Patient is bedbound from her prior stroke and plans to return home with husband     Type 2 diabetes mellitus with peripheral vascular disease (HCC):  Recent A1c 5.2 Hold metformin and Mounjaro  Continue sliding scale insulin Maintain consistent carbohydrate diet       History of cerebrovascular accident (CVA) with residual deficit -Lipitor -Hold Plavix for possible surgery     HTN (hypertension):  Patient with relative hypotension but asymptomatic Continue midodrine 5 mg 3 times daily       COPD (chronic obstructive pulmonary disease) (HCC): Not acutely exacerbated -Bronchodilators and as needed Mucinex  Tobacco abuse -Nicotine patch     Anxiety and depression -Continue Wellbutrin, Celexa, Seroquel,     Obesity: Class II BMI 38.19 Lifestyle modification and exercise has been discussed with patient in detail              Subjective: No new complaints  Physical Exam: Vitals:   03/30/23 1937 03/30/23 2354 03/31/23 0427 03/31/23 0729  BP: (!) 100/57 98/60 104/78 92/63  Pulse: 87  82 87  Resp: 17 16 18 17   Temp: 98.5 F (36.9 C) 98 F (36.7 C) 98.6 F (37 C) 98.2 F (36.8 C)  TempSrc:  Oral    SpO2: 97% 96% 99% 100%  Weight:      Height:      General: Not in acute distress HEENT:       Eyes: PERRL, EOMI, no jaundice       ENT: No discharge from the ears and nose, no pharynx injection, no tonsillar enlargement.        Neck: No JVD, no bruit, no mass felt. Heme: No neck lymph node enlargement. Cardiac: S1/S2, RRR, No murmurs, No gallops or rubs. Respiratory: No rales, wheezing, rhonchi or rubs. GI: Soft, nondistended, nontender, no rebound pain, no organomegaly, BS present. GU: No hematuria Ext: No pitting leg edema bilaterally.  Has erythema, tenderness, swelling, warmth, scabbed ulcer in right great toe    Musculoskeletal: No joint deformities, No joint redness or warmth, no limitation of ROM in spin. Skin: No rashes.  Neuro: Alert, oriented X3, cranial nerves II-XII grossly intact, has chronic residual right-sided weakness from previous stroke   Psych: Patient is not psychotic, no suicidal or homicidal ideation.    Data Reviewed:  There are no new results to review at this time.  Family Communication: Plan of care discussed with patient at the bedside.  She verbalizes understanding and agrees with the plan  Disposition: Status is: Inpatient Remains inpatient appropriate because: Patient is scheduled for femoral endarterectomy in a.m.  Planned Discharge Destination: Home with Home Health    Time spent: 34 minutes  Author: Lucile Shutters, MD 03/31/2023 1:59 PM  For on call review [email or web address].

## 2023-03-31 NOTE — Plan of Care (Signed)
  Problem: Education: Goal: Ability to describe self-care measures that may prevent or decrease complications (Diabetes Survival Skills Education) will improve Outcome: Progressing Goal: Individualized Educational Video(s) Outcome: Progressing   Problem: Coping: Goal: Ability to adjust to condition or change in health will improve Outcome: Progressing   Problem: Fluid Volume: Goal: Ability to maintain a balanced intake and output will improve Outcome: Progressing   Problem: Health Behavior/Discharge Planning: Goal: Ability to identify and utilize available resources and services will improve Outcome: Progressing Goal: Ability to manage health-related needs will improve Outcome: Progressing   Problem: Metabolic: Goal: Ability to maintain appropriate glucose levels will improve Outcome: Progressing   Problem: Nutritional: Goal: Maintenance of adequate nutrition will improve Outcome: Progressing Goal: Progress toward achieving an optimal weight will improve Outcome: Progressing   Problem: Skin Integrity: Goal: Risk for impaired skin integrity will decrease Outcome: Progressing   Problem: Tissue Perfusion: Goal: Adequacy of tissue perfusion will improve Outcome: Progressing   Problem: Clinical Measurements: Goal: Ability to maintain clinical measurements within normal limits will improve Outcome: Progressing Goal: Will remain free from infection Outcome: Progressing Goal: Diagnostic test results will improve Outcome: Progressing Goal: Respiratory complications will improve Outcome: Progressing Goal: Cardiovascular complication will be avoided Outcome: Progressing   Problem: Activity: Goal: Risk for activity intolerance will decrease Outcome: Progressing   Problem: Nutrition: Goal: Adequate nutrition will be maintained Outcome: Progressing   Problem: Coping: Goal: Level of anxiety will decrease Outcome: Progressing   Problem: Elimination: Goal: Will not  experience complications related to bowel motility Outcome: Progressing Goal: Will not experience complications related to urinary retention Outcome: Progressing   Problem: Safety: Goal: Ability to remain free from injury will improve Outcome: Progressing   Problem: Skin Integrity: Goal: Risk for impaired skin integrity will decrease Outcome: Progressing   Problem: Clinical Measurements: Goal: Ability to avoid or minimize complications of infection will improve Outcome: Progressing

## 2023-03-31 NOTE — Progress Notes (Signed)
 Progress Note    03/31/2023 10:22 AM 4 Days Post-Op  Subjective:  [name] is a 58 y.o. female with medical history significant of osteomyelitis of left great toe, HTN, HLD, DM, COPD, P:VD, stroke with right-sided weakness,, depression with anxiety, migraine, kidney stone, diverticulitis, tobacco abuse, is now POD #3 from left lower extremity angiogram. No intervention was preformed at the time of angiogram. Patient will need left femoral endarterectomy with stenting of her left popliteal artery.    Vitals:   03/31/23 0427 03/31/23 0729  BP: 104/78 92/63  Pulse: 82 87  Resp: 18 17  Temp: 98.6 F (37 C) 98.2 F (36.8 C)  SpO2: 99% 100%   Physical Exam: Cardiac:  RRR, Normal S1, S2. No murmurs appreciated.  Lungs:  Clear throughout on auscultation. No rales, rhonchi or wheezing to note.  Incisions:  right groin puncture site with dressing clean dry and intact. No hematoma, seroma to note.  Extremities:  Palpable pulses throughout except unable to palpate or doppler left Dorsalis pedis pulse. Left great toe with open sore and necrotic tip.  Abdomen:  Positive bowel sounds throughout, Soft non tender and non distended.  Neurologic: AAOX3, Answers all questions and follows commands appropriately.  CBC    Component Value Date/Time   WBC 8.4 03/27/2023 0912   RBC 4.06 03/27/2023 0912   HGB 12.0 03/27/2023 0912   HGB 14.6 09/22/2013 2251   HCT 36.4 03/27/2023 0912   HCT 44.3 09/22/2013 2251   PLT 267 03/27/2023 0912   PLT 300 09/22/2013 2251   MCV 89.7 03/27/2023 0912   MCV 90 09/22/2013 2251   MCH 29.6 03/27/2023 0912   MCHC 33.0 03/27/2023 0912   RDW 13.5 03/27/2023 0912   RDW 13.8 09/22/2013 2251   LYMPHSABS 3.0 03/25/2023 1821   LYMPHSABS 4.5 (H) 08/02/2013 2118   MONOABS 0.9 03/25/2023 1821   MONOABS 1.3 (H) 08/02/2013 2118   EOSABS 0.0 03/25/2023 1821   EOSABS 0.1 08/02/2013 2118   BASOSABS 0.0 03/25/2023 1821   BASOSABS 0.0 08/02/2013 2118    BMET     Component Value Date/Time   NA 139 03/27/2023 0912   NA 135 (L) 09/22/2013 2251   K 3.6 03/27/2023 0912   K 3.8 09/22/2013 2251   CL 114 (H) 03/27/2023 0912   CL 100 09/22/2013 2251   CO2 21 (L) 03/27/2023 0912   CO2 27 09/22/2013 2251   GLUCOSE 104 (H) 03/27/2023 0912   GLUCOSE 335 (H) 09/22/2013 2251   BUN 11 03/27/2023 0912   BUN 11 09/22/2013 2251   CREATININE 0.69 03/31/2023 0414   CREATININE 0.62 05/15/2021 1351   CALCIUM 8.5 (L) 03/27/2023 0912   CALCIUM 8.3 (L) 09/22/2013 2251   GFRNONAA >60 03/31/2023 0414   GFRNONAA >60 09/22/2013 2251   GFRAA >60 08/18/2017 0707   GFRAA >60 09/22/2013 2251    INR    Component Value Date/Time   INR 1.1 03/25/2023 1822   INR 0.9 08/28/2012 1927     Intake/Output Summary (Last 24 hours) at 03/31/2023 1022 Last data filed at 03/31/2023 0730 Gross per 24 hour  Intake 360 ml  Output 2200 ml  Net -1840 ml     Assessment/Plan:  58 y.o. female is s/p from left lower extremity angiogram. No intervention was preformed at the time of angiogram.  4 Days Post-Op   PLAN Left femoral endarterectomy with stent placement to the popliteal arteries plan for Wednesday, 04/01/2023.  Again this morning had a detailed discussion  with the patient regarding the upcoming surgery.  She does not have any more questions needed answered and she is willing to proceed.  Patient will be made n.p.o. after midnight.  DVT prophylaxis:  Heparin 5000 units subcu every 8 hours    Marcie [facility] 03/31/2023 10:22 AM

## 2023-03-31 NOTE — Telephone Encounter (Signed)
 Pt returned call.  Please call her back.  [phone]

## 2023-03-31 NOTE — Progress Notes (Signed)
   PODIATRY PROGRESS NOTE Patient Name: [name]  DOB [birth year] DOA 03/25/2023  Hospital Day: 7  Assessment:  58 y.o. female with PMH hypertension, hyperlipidemia, DM, COPD, PAD, history of stroke with right-sided weakness with Left hallux osteomyelitis and PAD.  AF, VSS  ESR/CRP: 15/0.6  Imaging: Left foot radiographs IMPRESSION: Ulcer or wound at the tip of the great toe with osseous destructive change involving tuft of first distal phalanx consistent with osteomyelitis.  Plan:  - Plan to proceed with amputation of L hallux 04/02/23 at 1000. Please keep NPO after MN Thursday. -Patient underwent angiogram on 3/14 which did not demonstrate 70 to 75% stenosis through the left common femoral artery, severe disease of the popliteal artery proximally.  Vascular planning left femoral endarterectomy and popliteal stenting on 3/19.  Fortunately the patient does have three-vessel runoff to the foot -Ulceration appears stable at this time.  Given the extent of her vascular disease and upcoming plans for intervention, plan to proceed with left first toe amputation following vascular intervention. -Weightbearing as tolerated in surgical shoe -Continue IV antibiotics Will continue to follow  Subjective:  Patient seen resting comfortably at bedside.  Discussed safety of proceeding with two procedures in 2 days and I discussed that the amputation could be done with light sedation and local anesthetic. All questions answered.   Objective:   Vitals:   03/31/23 0427 03/31/23 0729  BP: 104/78 92/63  Pulse: 82 87  Resp: 18 17  Temp: 98.6 F (37 C) 98.2 F (36.8 C)  SpO2: 99% 100%       Latest Ref Rng & Units 03/27/2023    9:12 AM 03/26/2023    6:32 AM 03/25/2023    6:21 PM  CBC  WBC 4.0 - 10.5 K/uL 8.4  11.7  15.2   Hemoglobin 12.0 - 15.0 g/dL 16.1  09.6  04.5   Hematocrit 36.0 - 46.0 % 36.4  38.2  45.0   Platelets 150 - 400 K/uL 267  261  389        Latest Ref Rng & Units  03/31/2023    4:14 AM 03/28/2023    1:53 AM 03/27/2023    9:12 AM  BMP  Glucose 70 - 99 mg/dL   409   BUN 6 - 20 mg/dL   11   Creatinine 8.11 - 1.00 mg/dL 9.14  7.82  9.56   Sodium 135 - 145 mmol/L   139   Potassium 3.5 - 5.1 mmol/L   3.6   Chloride 98 - 111 mmol/L   114   CO2 22 - 32 mmol/L   21   Calcium 8.9 - 10.3 mg/dL   8.5     General: AAOx3, NAD  Lower Extremity Exam Vasc: Nonpalpable DP and PT.  Dry to the first toe.  Capillary refill 3 to 5 seconds to digits  Derm: Eschar present left distal first toe, stable appearing.  No erythema Prophylactic bordered foam applied to the right heel  MSK:  Manual muscle strength testing 5/5 for dorsiflexion, plantarflexion, inversion and eversion.  No symptomatic limitations in joint range of motion.  Neuro: Epicritic sensation grossly   Radiology:  Results reviewed. See assessment for pertinent imaging results

## 2023-03-31 NOTE — Telephone Encounter (Signed)
 Left message for patient to return call.

## 2023-03-31 NOTE — H&P (View-Only) (Signed)
 Progress Note    03/31/2023 10:22 AM 4 Days Post-Op  Subjective:  [name] is a 58 y.o. female with medical history significant of osteomyelitis of left great toe, HTN, HLD, DM, COPD, P:VD, stroke with right-sided weakness,, depression with anxiety, migraine, kidney stone, diverticulitis, tobacco abuse, is now POD #3 from left lower extremity angiogram. No intervention was preformed at the time of angiogram. Patient will need left femoral endarterectomy with stenting of her left popliteal artery.    Vitals:   03/31/23 0427 03/31/23 0729  BP: 104/78 92/63  Pulse: 82 87  Resp: 18 17  Temp: 98.6 F (37 C) 98.2 F (36.8 C)  SpO2: 99% 100%   Physical Exam: Cardiac:  RRR, Normal S1, S2. No murmurs appreciated.  Lungs:  Clear throughout on auscultation. No rales, rhonchi or wheezing to note.  Incisions:  right groin puncture site with dressing clean dry and intact. No hematoma, seroma to note.  Extremities:  Palpable pulses throughout except unable to palpate or doppler left Dorsalis pedis pulse. Left great toe with open sore and necrotic tip.  Abdomen:  Positive bowel sounds throughout, Soft non tender and non distended.  Neurologic: AAOX3, Answers all questions and follows commands appropriately.  CBC    Component Value Date/Time   WBC 8.4 03/27/2023 0912   RBC 4.06 03/27/2023 0912   HGB 12.0 03/27/2023 0912   HGB 14.6 09/22/2013 2251   HCT 36.4 03/27/2023 0912   HCT 44.3 09/22/2013 2251   PLT 267 03/27/2023 0912   PLT 300 09/22/2013 2251   MCV 89.7 03/27/2023 0912   MCV 90 09/22/2013 2251   MCH 29.6 03/27/2023 0912   MCHC 33.0 03/27/2023 0912   RDW 13.5 03/27/2023 0912   RDW 13.8 09/22/2013 2251   LYMPHSABS 3.0 03/25/2023 1821   LYMPHSABS 4.5 (H) 08/02/2013 2118   MONOABS 0.9 03/25/2023 1821   MONOABS 1.3 (H) 08/02/2013 2118   EOSABS 0.0 03/25/2023 1821   EOSABS 0.1 08/02/2013 2118   BASOSABS 0.0 03/25/2023 1821   BASOSABS 0.0 08/02/2013 2118    BMET     Component Value Date/Time   NA 139 03/27/2023 0912   NA 135 (L) 09/22/2013 2251   K 3.6 03/27/2023 0912   K 3.8 09/22/2013 2251   CL 114 (H) 03/27/2023 0912   CL 100 09/22/2013 2251   CO2 21 (L) 03/27/2023 0912   CO2 27 09/22/2013 2251   GLUCOSE 104 (H) 03/27/2023 0912   GLUCOSE 335 (H) 09/22/2013 2251   BUN 11 03/27/2023 0912   BUN 11 09/22/2013 2251   CREATININE 0.69 03/31/2023 0414   CREATININE 0.62 05/15/2021 1351   CALCIUM 8.5 (L) 03/27/2023 0912   CALCIUM 8.3 (L) 09/22/2013 2251   GFRNONAA >60 03/31/2023 0414   GFRNONAA >60 09/22/2013 2251   GFRAA >60 08/18/2017 0707   GFRAA >60 09/22/2013 2251    INR    Component Value Date/Time   INR 1.1 03/25/2023 1822   INR 0.9 08/28/2012 1927     Intake/Output Summary (Last 24 hours) at 03/31/2023 1022 Last data filed at 03/31/2023 0730 Gross per 24 hour  Intake 360 ml  Output 2200 ml  Net -1840 ml     Assessment/Plan:  58 y.o. female is s/p from left lower extremity angiogram. No intervention was preformed at the time of angiogram.  4 Days Post-Op   PLAN Left femoral endarterectomy with stent placement to the popliteal arteries plan for Wednesday, 04/01/2023.  Again this morning had a detailed discussion  with the patient regarding the upcoming surgery.  She does not have any more questions needed answered and she is willing to proceed.  Patient will be made n.p.o. after midnight.  DVT prophylaxis:  Heparin 5000 units subcu every 8 hours    [facility] 03/31/2023 10:22 AM

## 2023-04-01 ENCOUNTER — Encounter: Payer: Self-pay | Admitting: Internal Medicine

## 2023-04-01 ENCOUNTER — Inpatient Hospital Stay: Admitting: Certified Registered"

## 2023-04-01 ENCOUNTER — Encounter
Admission: EM | Disposition: A | Discharge status: Home / Self Care | Payer: Self-pay | Source: Home / Self Care | Attending: Internal Medicine

## 2023-04-01 ENCOUNTER — Other Ambulatory Visit: Payer: Self-pay

## 2023-04-01 ENCOUNTER — Inpatient Hospital Stay

## 2023-04-01 DIAGNOSIS — I70202 Unspecified atherosclerosis of native arteries of extremities, left leg: Secondary | ICD-10-CM | POA: Diagnosis not present

## 2023-04-01 DIAGNOSIS — M86172 Other acute osteomyelitis, left ankle and foot: Secondary | ICD-10-CM | POA: Diagnosis not present

## 2023-04-01 HISTORY — PX: ENDARTERECTOMY FEMORAL: SHX5804

## 2023-04-01 LAB — CBC
HCT: 35.2 % — ABNORMAL LOW (ref 36.0–46.0)
HCT: 38.6 % (ref 36.0–46.0)
Hemoglobin: 11.8 g/dL — ABNORMAL LOW (ref 12.0–15.0)
Hemoglobin: 12.7 g/dL (ref 12.0–15.0)
MCH: 29.3 pg (ref 26.0–34.0)
MCH: 29.6 pg (ref 26.0–34.0)
MCHC: 32.9 g/dL (ref 30.0–36.0)
MCHC: 33.5 g/dL (ref 30.0–36.0)
MCV: 87.3 fL (ref 80.0–100.0)
MCV: 90 fL (ref 80.0–100.0)
Platelets: 279 10*3/uL (ref 150–400)
Platelets: 287 10*3/uL (ref 150–400)
RBC: 4.03 MIL/uL (ref 3.87–5.11)
RBC: 4.29 MIL/uL (ref 3.87–5.11)
RDW: 13.8 % (ref 11.5–15.5)
RDW: 13.9 % (ref 11.5–15.5)
WBC: 10.7 10*3/uL — ABNORMAL HIGH (ref 4.0–10.5)
WBC: 11.1 10*3/uL — ABNORMAL HIGH (ref 4.0–10.5)
nRBC: 0 % (ref 0.0–0.2)
nRBC: 0 % (ref 0.0–0.2)

## 2023-04-01 LAB — GLUCOSE, CAPILLARY
Glucose-Capillary: 116 mg/dL — ABNORMAL HIGH (ref 70–99)
Glucose-Capillary: 123 mg/dL — ABNORMAL HIGH (ref 70–99)
Glucose-Capillary: 104 mg/dL — ABNORMAL HIGH (ref 70–99)
Glucose-Capillary: 178 mg/dL — ABNORMAL HIGH (ref 70–99)
Glucose-Capillary: 198 mg/dL — ABNORMAL HIGH (ref 70–99)
Glucose-Capillary: 218 mg/dL — ABNORMAL HIGH (ref 70–99)

## 2023-04-01 LAB — CREATININE, SERUM
Creatinine, Ser: 0.71 mg/dL (ref 0.44–1.00)
Creatinine, Ser: 0.61 mg/dL (ref 0.44–1.00)
GFR, Estimated: >60 mL/min (ref >60)
GFR, Estimated: >60 mL/min (ref >60)

## 2023-04-01 LAB — MRSA NEXT GEN BY PCR, NASAL: MRSA by PCR Next Gen: NOT DETECTED

## 2023-04-01 MED ORDER — HEMOSTATIC AGENTS (NO CHARGE) OPTIME
TOPICAL | Status: DC | PRN
  Administered 2023-04-01: 1 via TOPICAL

## 2023-04-01 MED ORDER — PROPOFOL 10 MG/ML IV BOLUS
INTRAVENOUS | Status: DC | PRN
  Administered 2023-04-01: 80 mg via INTRAVENOUS

## 2023-04-01 MED ORDER — SODIUM CHLORIDE 0.9 % IV SOLN: INTRAVENOUS | Status: DC | PRN

## 2023-04-01 MED ORDER — ASPIRIN 81 MG PO TBEC
81.0000 mg | DELAYED_RELEASE_TABLET | Freq: Every day | ORAL | Status: DC
  Administered 2023-04-03 – 2023-04-04 (×2): 81 mg via ORAL
  Filled 2023-04-01 (×2): qty 1

## 2023-04-01 MED ORDER — ONDANSETRON HCL 4 MG/2ML IJ SOLN
INTRAMUSCULAR | Status: DC | PRN
  Administered 2023-04-01: 4 mg via INTRAVENOUS

## 2023-04-01 MED ORDER — DEXAMETHASONE SODIUM PHOSPHATE 10 MG/ML IJ SOLN
INTRAMUSCULAR | Status: AC
  Filled 2023-04-01: qty 1

## 2023-04-01 MED ORDER — GENTAMICIN SULFATE 40 MG/ML IJ SOLN
INTRAMUSCULAR | Status: DC | PRN
  Administered 2023-04-01: 80 mg

## 2023-04-01 MED ORDER — ALUM & MAG HYDROXIDE-SIMETH 200-200-20 MG/5ML PO SUSP: 15.0000 mL | ORAL | Status: DC | PRN

## 2023-04-01 MED ORDER — POLYETHYLENE GLYCOL 3350 17 G PO PACK
17.0000 g | PACK | Freq: Two times a day (BID) | ORAL | Status: DC
  Administered 2023-04-02 – 2023-04-03 (×3): 17 g via ORAL
  Filled 2023-04-01 (×5): qty 1

## 2023-04-01 MED ORDER — SORBITOL 70 % SOLN: 30.0000 mL | Freq: Every day | Status: DC | PRN

## 2023-04-01 MED ORDER — VITAMIN D (ERGOCALCIFEROL) 1.25 MG (50000 UNIT) PO CAPS
50000.0000 [IU] | ORAL_CAPSULE | ORAL | Status: DC
  Filled 2023-04-01: qty 1

## 2023-04-01 MED ORDER — DROPERIDOL 2.5 MG/ML IJ SOLN: 0.6250 mg | Freq: Once | INTRAMUSCULAR | Status: DC | PRN

## 2023-04-01 MED ORDER — HEPARIN 30,000 UNITS/1000 ML (OHS) CELLSAVER SOLUTION
Status: AC | PRN
Start: 2023-04-01 — End: 2023-04-01
  Administered 2023-04-01: 1

## 2023-04-01 MED ORDER — OXYCODONE HCL 5 MG PO TABS
5.0000 mg | ORAL_TABLET | ORAL | Status: DC | PRN
  Administered 2023-04-01 – 2023-04-02 (×4): 10 mg via ORAL
  Filled 2023-04-01: qty 2
  Filled 2023-04-01: qty 1
  Filled 2023-04-01 (×3): qty 2
  Filled 2023-04-01: qty 1

## 2023-04-01 MED ORDER — CLOPIDOGREL BISULFATE 75 MG PO TABS
75.0000 mg | ORAL_TABLET | Freq: Every day | ORAL | Status: DC
  Administered 2023-04-02 – 2023-04-04 (×3): 75 mg via ORAL
  Filled 2023-04-01 (×3): qty 1

## 2023-04-01 MED ORDER — SENNOSIDES-DOCUSATE SODIUM 8.6-50 MG PO TABS
2.0000 | ORAL_TABLET | Freq: Two times a day (BID) | ORAL | Status: DC
  Administered 2023-04-01 – 2023-04-04 (×6): 2 via ORAL
  Filled 2023-04-01 (×6): qty 2

## 2023-04-01 MED ORDER — PHENYLEPHRINE HCL-NACL 20-0.9 MG/250ML-% IV SOLN
INTRAVENOUS | Status: DC | PRN
Start: 2023-04-01 — End: 2023-04-01
  Administered 2023-04-01: 80 ug/min via INTRAVENOUS
  Administered 2023-04-01 (×3): 80 ug via INTRAVENOUS
  Administered 2023-04-01: 30 ug via INTRAVENOUS
  Administered 2023-04-01: 80 ug via INTRAVENOUS

## 2023-04-01 MED ORDER — LIDOCAINE HCL (CARDIAC) PF 100 MG/5ML IV SOSY
PREFILLED_SYRINGE | INTRAVENOUS | Status: DC | PRN
  Administered 2023-04-01: 50 mg via INTRAVENOUS

## 2023-04-01 MED ORDER — MIDAZOLAM HCL 2 MG/2ML IJ SOLN
INTRAMUSCULAR | Status: AC
  Filled 2023-04-01: qty 2

## 2023-04-01 MED ORDER — BUPIVACAINE HCL (PF) 0.5 % IJ SOLN
INTRAMUSCULAR | Status: AC
Start: 2023-04-01 — End: ?
  Filled 2023-04-01: qty 30

## 2023-04-01 MED ORDER — ACETAMINOPHEN 10 MG/ML IV SOLN
INTRAVENOUS | Status: DC | PRN
  Administered 2023-04-01: 1000 mg via INTRAVENOUS

## 2023-04-01 MED ORDER — LABETALOL HCL 5 MG/ML IV SOLN: 10.0000 mg | INTRAVENOUS | Status: DC | PRN

## 2023-04-01 MED ORDER — STERILE WATER FOR IRRIGATION IR SOLN
Status: DC | PRN
  Administered 2023-04-01: 1000 mL

## 2023-04-01 MED ORDER — BUPIVACAINE LIPOSOME 1.3 % IJ SUSP
INTRAMUSCULAR | Status: DC | PRN
  Administered 2023-04-01: 50 mL

## 2023-04-01 MED ORDER — HEPARIN SODIUM (PORCINE) 1000 UNIT/ML IJ SOLN
INTRAMUSCULAR | Status: AC
  Filled 2023-04-01: qty 10

## 2023-04-01 MED ORDER — SUGAMMADEX SODIUM 200 MG/2ML IV SOLN
INTRAVENOUS | Status: DC | PRN
  Administered 2023-04-01: 200 mg via INTRAVENOUS

## 2023-04-01 MED ORDER — HEPARIN SODIUM (PORCINE) 1000 UNIT/ML IJ SOLN
INTRAMUSCULAR | Status: DC | PRN
Start: 2023-04-01 — End: 2023-04-01
  Administered 2023-04-01: 6000 [IU] via INTRAVENOUS

## 2023-04-01 MED ORDER — PHENOL 1.4 % MT LIQD
1.0000 | OROMUCOSAL | Status: DC | PRN
  Filled 2023-04-01: qty 177

## 2023-04-01 MED ORDER — METOPROLOL TARTRATE 5 MG/5ML IV SOLN: 2.0000 mg | INTRAVENOUS | Status: DC | PRN

## 2023-04-01 MED ORDER — ONDANSETRON HCL 4 MG/2ML IJ SOLN
4.0000 mg | Freq: Four times a day (QID) | INTRAMUSCULAR | Status: DC | PRN
  Administered 2023-04-02: 4 mg via INTRAVENOUS

## 2023-04-01 MED ORDER — DEXAMETHASONE SODIUM PHOSPHATE 10 MG/ML IJ SOLN
INTRAMUSCULAR | Status: DC | PRN
  Administered 2023-04-01: 5 mg via INTRAVENOUS

## 2023-04-01 MED ORDER — HYDROMORPHONE HCL 1 MG/ML IJ SOLN
0.5000 mg | INTRAMUSCULAR | Status: DC | PRN
  Administered 2023-04-01 – 2023-04-04 (×15): 1 mg via INTRAVENOUS
  Filled 2023-04-01 (×16): qty 1

## 2023-04-01 MED ORDER — PHENYLEPHRINE HCL-NACL 20-0.9 MG/250ML-% IV SOLN
INTRAVENOUS | Status: AC
  Filled 2023-04-01: qty 250

## 2023-04-01 MED ORDER — SUGAMMADEX SODIUM 200 MG/2ML IV SOLN
INTRAVENOUS | Status: AC
  Filled 2023-04-01: qty 2

## 2023-04-01 MED ORDER — MAGNESIUM SULFATE 2 GM/50ML IV SOLN: 2.0000 g | Freq: Every day | INTRAVENOUS | Status: DC | PRN

## 2023-04-01 MED ORDER — HEPARIN SODIUM (PORCINE) 5000 UNIT/ML IJ SOLN
INTRAMUSCULAR | Status: AC
  Filled 2023-04-01: qty 1

## 2023-04-01 MED ORDER — SODIUM CHLORIDE 0.9 % IV SOLN: 500.0000 mL | Freq: Once | INTRAVENOUS | Status: DC | PRN

## 2023-04-01 MED ORDER — GENTAMICIN SULFATE 40 MG/ML IJ SOLN
INTRAMUSCULAR | Status: AC
  Filled 2023-04-01: qty 2

## 2023-04-01 MED ORDER — SODIUM CHLORIDE 0.9 % IV SOLN: INTRAVENOUS | Status: DC

## 2023-04-01 MED ORDER — BUPIVACAINE LIPOSOME 1.3 % IJ SUSP
INTRAMUSCULAR | Status: AC
  Filled 2023-04-01: qty 20

## 2023-04-01 MED ORDER — SENNOSIDES-DOCUSATE SODIUM 8.6-50 MG PO TABS: 1.0000 | ORAL_TABLET | Freq: Every evening | ORAL | Status: DC | PRN

## 2023-04-01 MED ORDER — ROCURONIUM BROMIDE 10 MG/ML (PF) SYRINGE
PREFILLED_SYRINGE | INTRAVENOUS | Status: AC
  Filled 2023-04-01: qty 10

## 2023-04-01 MED ORDER — VANCOMYCIN HCL IN DEXTROSE 1-5 GM/200ML-% IV SOLN
1000.0000 mg | Freq: Two times a day (BID) | INTRAVENOUS | Status: DC
  Filled 2023-04-01: qty 200

## 2023-04-01 MED ORDER — FENTANYL CITRATE (PF) 100 MCG/2ML IJ SOLN
INTRAMUSCULAR | Status: AC
  Filled 2023-04-01: qty 2

## 2023-04-01 MED ORDER — PHENYLEPHRINE 80 MCG/ML (10ML) SYRINGE FOR IV PUSH (FOR BLOOD PRESSURE SUPPORT)
PREFILLED_SYRINGE | INTRAVENOUS | Status: AC
  Filled 2023-04-01: qty 10

## 2023-04-01 MED ORDER — MIDAZOLAM HCL 2 MG/2ML IJ SOLN
INTRAMUSCULAR | Status: DC | PRN
  Administered 2023-04-01: 2 mg via INTRAVENOUS

## 2023-04-01 MED ORDER — FENTANYL CITRATE (PF) 100 MCG/2ML IJ SOLN: 25.0000 ug | INTRAMUSCULAR | Status: DC | PRN

## 2023-04-01 MED ORDER — LIDOCAINE HCL (PF) 2 % IJ SOLN
INTRAMUSCULAR | Status: AC
  Filled 2023-04-01: qty 5

## 2023-04-01 MED ORDER — ONDANSETRON HCL 4 MG/2ML IJ SOLN
INTRAMUSCULAR | Status: AC
  Filled 2023-04-01: qty 2

## 2023-04-01 MED ORDER — HYDROMORPHONE HCL 1 MG/ML IJ SOLN
1.0000 mg | INTRAMUSCULAR | Status: DC | PRN
  Administered 2023-04-01: 1 mg via INTRAVENOUS
  Filled 2023-04-01: qty 1

## 2023-04-01 MED ORDER — PROPOFOL 10 MG/ML IV BOLUS
INTRAVENOUS | Status: AC
  Filled 2023-04-01: qty 40

## 2023-04-01 MED ORDER — VANCOMYCIN HCL 1 G IV SOLR
INTRAVENOUS | Status: DC | PRN
  Administered 2023-04-01: 1000 mg via TOPICAL

## 2023-04-01 MED ORDER — DOCUSATE SODIUM 100 MG PO CAPS
100.0000 mg | ORAL_CAPSULE | Freq: Every day | ORAL | Status: DC
  Administered 2023-04-02 – 2023-04-04 (×3): 100 mg via ORAL
  Filled 2023-04-01 (×3): qty 1

## 2023-04-01 MED ORDER — HEPARIN 30,000 UNITS/1000 ML (OHS) CELLSAVER SOLUTION
Status: AC
  Filled 2023-04-01: qty 1000

## 2023-04-01 MED ORDER — SODIUM CHLORIDE 0.9 % IR SOLN
Status: DC | PRN
  Administered 2023-04-01: 501 mL

## 2023-04-01 MED ORDER — PIPERACILLIN-TAZOBACTAM 3.375 G IVPB
INTRAVENOUS | Status: AC
  Filled 2023-04-01: qty 50

## 2023-04-01 MED ORDER — FAMOTIDINE IN NACL 20-0.9 MG/50ML-% IV SOLN
20.0000 mg | Freq: Two times a day (BID) | INTRAVENOUS | Status: DC
  Administered 2023-04-01 – 2023-04-02 (×3): 20 mg via INTRAVENOUS
  Filled 2023-04-01 (×4): qty 50

## 2023-04-01 MED ORDER — NITROGLYCERIN IN D5W 200-5 MCG/ML-% IV SOLN
INTRAVENOUS | Status: AC
  Filled 2023-04-01: qty 250

## 2023-04-01 MED ORDER — FENTANYL CITRATE (PF) 100 MCG/2ML IJ SOLN
INTRAMUSCULAR | Status: DC | PRN
  Administered 2023-04-01 (×2): 50 ug via INTRAVENOUS

## 2023-04-01 MED ORDER — VANCOMYCIN HCL IN DEXTROSE 1-5 GM/200ML-% IV SOLN
INTRAVENOUS | Status: AC
  Filled 2023-04-01: qty 200

## 2023-04-01 MED ORDER — CHLORHEXIDINE GLUCONATE CLOTH 2 % EX PADS
6.0000 | MEDICATED_PAD | Freq: Every day | CUTANEOUS | Status: DC
  Administered 2023-04-01 – 2023-04-04 (×4): 6 via TOPICAL

## 2023-04-01 MED ORDER — ROCURONIUM BROMIDE 100 MG/10ML IV SOLN
INTRAVENOUS | Status: DC | PRN
  Administered 2023-04-01: 40 mg via INTRAVENOUS
  Administered 2023-04-01 (×2): 20 mg via INTRAVENOUS
  Administered 2023-04-01: 60 mg via INTRAVENOUS

## 2023-04-01 MED ORDER — ACETAMINOPHEN 10 MG/ML IV SOLN
INTRAVENOUS | Status: AC
  Filled 2023-04-01: qty 100

## 2023-04-01 MED ORDER — DOPAMINE-DEXTROSE 3.2-5 MG/ML-% IV SOLN
3.0000 ug/kg/min | INTRAVENOUS | Status: DC
Start: 2023-04-01 — End: 2023-04-04

## 2023-04-01 MED ORDER — NITROGLYCERIN IN D5W 200-5 MCG/ML-% IV SOLN: 5.0000 ug/min | INTRAVENOUS | Status: DC

## 2023-04-01 NOTE — Interval H&P Note (Signed)
 History and Physical Interval Note:  04/01/2023 12:42 PM  [name]  has presented today for surgery, with the diagnosis of PAD.  The various methods of treatment have been discussed with the patient and family. After consideration of risks, benefits and other options for treatment, the patient has consented to  Procedure(s): ENDARTERECTOMY, FEMORAL (Left) APPLICATION OF CELL SAVER (Left) as a surgical intervention.  The patient's history has been reviewed, patient examined, no change in status, stable for surgery.  I have reviewed the patient's chart and labs.  Questions were answered to the patient's satisfaction.     Levora Dredge

## 2023-04-01 NOTE — Anesthesia Procedure Notes (Signed)
 Procedure Name: Intubation Date/Time: 04/01/2023 1:06 PM  Performed by: Morene Crocker, CRNAPre-anesthesia Checklist: Patient identified, Patient being monitored, Timeout performed, Emergency Drugs available and Suction available Patient Re-evaluated:Patient Re-evaluated prior to induction Oxygen Delivery Method: Circle system utilized Preoxygenation: Pre-oxygenation with 100% oxygen Induction Type: IV induction Ventilation: Mask ventilation without difficulty and Oral airway inserted - appropriate to patient size Laryngoscope Size: 3 and McGrath Grade View: Grade I Tube type: Oral Tube size: 7.0 mm Number of attempts: 1 Airway Equipment and Method: Stylet Placement Confirmation: ETT inserted through vocal cords under direct vision, positive ETCO2 and breath sounds checked- equal and bilateral Secured at: 21 cm Tube secured with: Tape Dental Injury: Teeth and Oropharynx as per pre-operative assessment  Comments: Smooth atraumatic intubation, no complications noted.

## 2023-04-01 NOTE — Plan of Care (Signed)
  Problem: Education: Goal: Ability to describe self-care measures that may prevent or decrease complications (Diabetes Survival Skills Education) will improve Outcome: Progressing Goal: Individualized Educational Video(s) Outcome: Progressing   Problem: Coping: Goal: Ability to adjust to condition or change in health will improve Outcome: Progressing   Problem: Fluid Volume: Goal: Ability to maintain a balanced intake and output will improve Outcome: Progressing   Problem: Health Behavior/Discharge Planning: Goal: Ability to identify and utilize available resources and services will improve Outcome: Progressing Goal: Ability to manage health-related needs will improve Outcome: Progressing   Problem: Metabolic: Goal: Ability to maintain appropriate glucose levels will improve Outcome: Progressing   Problem: Nutritional: Goal: Maintenance of adequate nutrition will improve Outcome: Progressing Goal: Progress toward achieving an optimal weight will improve Outcome: Progressing   Problem: Skin Integrity: Goal: Risk for impaired skin integrity will decrease Outcome: Progressing   Problem: Tissue Perfusion: Goal: Adequacy of tissue perfusion will improve Outcome: Progressing   Problem: Education: Goal: Knowledge of General Education information will improve Description: Including pain rating scale, medication(s)/side effects and non-pharmacologic comfort measures Outcome: Progressing   Problem: Health Behavior/Discharge Planning: Goal: Ability to manage health-related needs will improve Outcome: Progressing   Problem: Clinical Measurements: Goal: Ability to maintain clinical measurements within normal limits will improve Outcome: Progressing Goal: Will remain free from infection Outcome: Progressing Goal: Diagnostic test results will improve Outcome: Progressing Goal: Respiratory complications will improve Outcome: Progressing Goal: Cardiovascular complication will  be avoided Outcome: Progressing   Problem: Activity: Goal: Risk for activity intolerance will decrease Outcome: Progressing   Problem: Nutrition: Goal: Adequate nutrition will be maintained Outcome: Progressing   Problem: Coping: Goal: Level of anxiety will decrease Outcome: Progressing   Problem: Elimination: Goal: Will not experience complications related to bowel motility Outcome: Progressing Goal: Will not experience complications related to urinary retention Outcome: Progressing   Problem: Pain Managment: Goal: General experience of comfort will improve and/or be controlled Outcome: Progressing   Problem: Safety: Goal: Ability to remain free from injury will improve Outcome: Progressing   Problem: Skin Integrity: Goal: Risk for impaired skin integrity will decrease Outcome: Progressing   Problem: Clinical Measurements: Goal: Ability to avoid or minimize complications of infection will improve Outcome: Progressing   Problem: Skin Integrity: Goal: Skin integrity will improve Outcome: Progressing   Problem: Education: Goal: Knowledge of prescribed regimen will improve Outcome: Progressing   Problem: Activity: Goal: Ability to tolerate increased activity will improve Outcome: Progressing   Problem: Bowel/Gastric: Goal: Gastrointestinal status for postoperative course will improve Outcome: Progressing   Problem: Clinical Measurements: Goal: Postoperative complications will be avoided or minimized Outcome: Progressing Goal: Signs and symptoms of graft occlusion will improve Outcome: Progressing   Problem: Skin Integrity: Goal: Demonstration of wound healing without infection will improve Outcome: Progressing

## 2023-04-01 NOTE — Op Note (Signed)
 OPERATIVE NOTE   PROCEDURE: 1.   Left common femoral, profunda femoris, and superficial femoral artery endarterectomies and patch angioplasty    PRE-OPERATIVE DIAGNOSIS: 1.Atherosclerotic occlusive disease left lower extremities with ulceration and osteomyelitis of the left foot   POST-OPERATIVE DIAGNOSIS: Same  SURGEON: Festus Barren, MD  CO-surgeon:  Levora Dredge, MD  ANESTHESIA:  general  ESTIMATED BLOOD LOSS: 100 cc  FINDING(S): 1.  significant plaque in left common femoral artery  SPECIMEN(S):  Left common femoral artery plaque.  INDICATIONS:    Patient presents with ulceration and infection of the left foot.  Left femoral endarterectomy is planned to try to improve perfusion.  The risks and benefits as well as alternative therapies including intervention were reviewed in detail all questions were answered the patient agrees to proceed with surgery.  DESCRIPTION: After obtaining full informed written consent, the patient was brought back to the operating room and placed supine upon the operating table.  The patient received IV antibiotics prior to induction.  After obtaining adequate anesthesia, the patient was prepped and draped in the standard fashion appropriate time out is called.    Vertical incision was created overlying the left femoral arteries. The common femoral artery proximally, and superficial femoral artery, and primary profunda femoris artery branches were encircled with vessel loops and prepared for control. The left femoral arteries were found to have significant plaque from the common femoral artery into the profunda and superficial femoral arteries.   6000 units of heparin was given and allowed circulate for 5 minutes.   Attention is then turned to the left femoral artery.  An arteriotomy is made with 11 blade and extended with Potts scissors in the common femoral artery and carried down onto the first 2-3 cm of the superficial femoral artery. An  endarterectomy was then performed. The [facility] was used to create a plane. The proximal endpoint was cut flush with tenotomy scissors. This was in the proximal common femoral artery. The distal endpoint of the superficial femoral endarterectomy was created with gentle traction and the distal endpoint was tacked down with 7-0 Prolene sutures.  The bovine pericardial patcth is then selected and prepared for a patch angioplasty.  It is cut and beveled and started at the proximal endpoint with a 6-0 Prolene suture.  Approximately one half of the suture line is run medially and laterally and the distal end point was cut and bevelled to match the arteriotomy.  A second 6-0 Prolene was started at the distal end point and run to the mid portion to complete the arteriotomy.  The vessel was flushed prior to release of control and completion of the anastomosis.  At this point, flow was established first to the profunda femoris artery and then to the superficial femoral artery. Easily palpable pulses are noted well beyond the anastomosis and both arteries. Attention was then turned to the endovascular portion of the procedure which was performed primarily by Dr. Gilda Crease and he will be dictating this portion the procedure.  He was able to easily cross the disease in the proximal SFA and in the popliteal artery and these were both stented.  The sheath was removed and a 5-0 Prolene pursestring suture was placed around the exit site.  Fibrillar topical hemostatic agents were placed in the femoral incision and hemostasis was complete. Gentamicin and Vancomycin impregnated Stimlan beads were placed in the wound after Vashe irrigation. The femoral incision was then closed in a layered fashion with 2 layers of 2-0 Vicryl, 2  layers of 3-0 Vicryl, and staples for the skin closure. Dermabond and sterile dressing were then placed over the incision.  The patient was then awakened from anesthesia and taken to the recovery room  in stable condition having tolerated the procedure well.  COMPLICATIONS: None  CONDITION: Stable     Festus Barren 04/01/2023 3:56 PM   This note was created with Dragon Medical transcription system. Any errors in dictation are purely unintentional.

## 2023-04-01 NOTE — Op Note (Signed)
 OPERATIVE NOTE   PROCEDURE: Left common femoral endarterectomy with bovine pericardial patch angioplasty Open angioplasty and stent placement at the origin of the left SFA with a 7 mm x 25 mm Viabahn stent. Open angioplasty and stent placement mid popliteal with a 5 mm x 60 mm life stent  PRE-OPERATIVE DIAGNOSIS: Atherosclerotic occlusive disease left lower extremity with lifestyle limiting ulceration and osteomyelitis of the foot and rest pain symptoms; diabetes mellitus  POST-OPERATIVE DIAGNOSIS: Same  CO-SURGEON: Renford Dills, MD and Annice Needy, M.D.  ASSISTANT(S): None  ANESTHESIA: general  ESTIMATED BLOOD LOSS: 50 cc  FINDING(S): Profound calcific plaque noted of the left common femoral extending past the femoral bifurcation down the SFA  SPECIMEN(S):  Calcific plaque from the common femoral, superficial femoral and the profunda femoris artery  INDICATIONS:   [name] 58 y.o. y.o.female who presents with complaints of lifestyle limiting claudication and pain continuously in the left lower extremity.  She has osteomyelitis of the left forefoot.  The patient has documented severe atherosclerotic occlusive disease and has undergone minimally invasive treatments in the past. However, at this point his primary area of stricture stenosis resides in the common femoral and origins of the superficial femoral and profunda femoris extending into these arteries and therefore this is not amenable to intervention. The patient is therefore undergoing open endarterectomy. The risks and benefits of surgery have been reviewed with the patient, all questions have answered; alternative therapies have been reviewed as well and the patient has agreed to proceed with surgical open repair.  DESCRIPTION: After obtaining full informed written consent, the patient was brought back to the operating room and placed supine upon the operating table.  The  patient received IV antibiotics prior to induction.  After obtaining adequate anesthesia, the patient was prepped and draped in the standard fashion for left femoral exposure.  Co-surgeons are required because this is a complicated procedure with work being performed simultaneously by both surgeons.  This expedites the procedure making a shorter operative time reducing complications and improving patient safety.  Attention was turned to the left groin with Dr. Wyn Quaker working on the patient's right and myself working on the left of the patient.  Vertical  Incision was made over the left common femoral artery and dissection carried down to the common femoral artery with electrocautery.  I dissected out the common femoral artery from the distal external iliac artery (identified by the superficial circumflex vessels) down to the femoral bifurcation.  On initial inspection, the common femoral artery was: densely calcified and there was no palpable pulse noted.    Subsequently the dissection was continued to include all circumflex branches and the profunda femoral artery and superficial femoral artery. The superficial femoral artery was dissected circumferentially for a distance of approximately 3-4 cm and the profunda femoris was dissected circumferentially out to the fourth order branches individual vessel loops were placed around each branch.  Control of all branches was obtained with vessel loops.  A softer area in the distal external iliac artery amendable to clamping was identified.    The patient was given 6000 units of Heparin intravenously, which was a therapeutic bolus.   After waiting 3 minutes, the distal external iliac artery was clamped and all of the vessel loops were placed under tension.  Arteriotomy was made in the common femoral artery with a 11-blade and extended it with a Potts scissor proximally and distally extending the distal end down the SFA for approximately 2-3 cm.  Endarterectomy was then  performed under direct visualization using a freer elevator and a right angle from the mid common femoral extending up both proximally and distally. Proximally the endarterectomy was brought up to the level of the clamp where a clean edge was obtained. Distally the endarterectomy was carried down to a soft spot in the SFA where a feathered edge would was obtained.  7-0 Prolene interrupted tacking sutures were placed to secure the leading edge of the plaque in the SFA.  At this point, a bovine pericardial patch was fashioned for the geometry of the arteriotomy.  The pericardial patch was sewn to the artery with 2 running stitches of 6-0 Prolene, running from each end.  Prior to completing the patch angioplasty, the profunda femoral artery was flushed as was the superficial femoral artery. The system was then forward flushed. The endarterectomy site was then irrigated copiously with heparinized saline. The patch angioplasty was completed in the usual fashion.  Flow was then reestablished first to the profunda femoris and then the superficial femoral artery. Any gaps or bleeding sites in the suture line were easily controlled with a 6-0 Prolene suture.   I then turned my attention to the interventional portion.  Using a Seldinger needle I accessed the mid portion of the patch and a J-wire was advanced without difficulty.  6 French sheath was then inserted.  Using a 0.035 advantage wire in association with a Kumpe catheter was then able to negotiate down into the popliteal and the subtotal occlusion was crossed with a wire and then the catheter.  Hand-injection of contrast verified intraluminal positioning.  A supra core wire was then advanced under fluoroscopic guidance.  Initially the subtotal occlusion of the popliteal was treated with a 4 mm x 100 mm Lutonix drug-eluting balloon inflated to 8 atm for approximately 1 minute.  Follow-up imaging demonstrated greater than 50% residual stenosis particularly in the  area that have been so severely stenotic and a 5 mm x 60 mm life stent was deployed across this area.  It was postdilated with a 5 mm Lutonix drug-eluting balloon inflated to 8 atm for 1 minute.    I then returned the C arm to the groin and began a completion runoff.  This showed a greater than 60% stenosis approximately 3 to 4 cm distal to the patch.  The sheath was upsized to a 7 [place] sheath and the wire exchanged for a V18 0.018 wire a 7 mm x 25 mm Viabahn was then deployed across this stenosis and postdilated with a 6 mm x 40 mm Lutonix drug-eluting balloon.  Follow-up imaging now demonstrated less than 10% residual stenosis and I then completed the runoff of the left lower extremity.  The area of the popliteal that was treated with the life stent demonstrated less than 10% residual stenosis and there was preservation of the three-vessel runoff to the foot with the posterior tibial being the dominant vessel.  Pursestring suture of 5-0 Prolene was then placed around the sheath and the sheath was removed without difficulty.  The left groin was then irrigated copiously with Vashe and subsequently fibrillar were placed in the wound.  Exparel with Marcaine was then infiltrated into the soft tissues of the groin.  Additionally, antibiotic beads using vancomycin and gentamicin were also placed in the bed of the wounds.  The incision was repaired with a double layer of 2-0 Vicryl, a double layer of 3-0 Vicryl, and staples were used to approximate the skin.  Honeycomb  dressing was placed.   COMPLICATIONS: None  CONDITION: [name], M.D. [facility] Office: [phone]  04/01/2023, 4:33 PM

## 2023-04-01 NOTE — Progress Notes (Signed)
 PROGRESS NOTE    [name]  UUV:[id number] DOB: Jul 12, 1965 DOA: 03/25/2023 PCP: Smitty Cords, DO   Assessment & Plan:   Principal Problem:   Osteomyelitis of left foot (HCC) Active Problems:   PVD (peripheral vascular disease) (HCC)   Type 2 diabetes mellitus with peripheral vascular disease (HCC)   History of cerebrovascular accident (CVA) with residual deficit   HTN (hypertension)   Hyperlipidemia   COPD (chronic obstructive pulmonary disease) (HCC)   Tobacco abuse   Anxiety and depression   Obesity (BMI 30-39.9)   Acute osteomyelitis of toe of left foot (HCC)  Assessment and Plan:  Osteomyelitis of left foot: likely secondary to PAD & DM2. Failed outpatient treatment w/ doxy & cipro. Continue on IV zosyn, vanco   PAD: going for left femoral endarterectomy 04/01/23 and will go for left great toe amputation on 04/02/23 as per podiatry  HLD: continue on statin   DM2: likely well controlled, HbA1c 5.2 in 04/2022. Continue on SSI w/ accuchecks    Hx of CVA: w/ residual deficit. Continue on statin. Holding plavi   COPD: w/o exacerbation. Bronchodilators prn    Tobacco abuse: nicotine patch to prevent w/ drawl. Received smoking cessation counseling x 5 mins    Depression: severity unknown. Continue on home dose of bupropion, citalopram, seroquel    Obesity: Class II. BMI 34.8. Complicates overall care & prognosis       DVT prophylaxis: heparin  Code Status: full  Family Communication:  Disposition Plan: likely d/c back home   Level of care: Med-Surg  Status is: Inpatient Remains inpatient appropriate because: severity of illness, procedure today & surg tomorrow     Consultants:  Vasc surg Podiatry   Procedures:  Antimicrobials: vanco, zosyn    Subjective: Pt c/o malaise   Objective: Vitals:   03/31/23 1545 03/31/23 2148 04/01/23 0427 04/01/23 0745  BP: 95/63 124/79 92/62 (!) 103/58  Pulse: 89 88 82 81  Resp: 16 17  18   Temp: 98.3 F  (36.8 C)  98 F (36.7 C) 98.1 F (36.7 C)  TempSrc: Oral  Oral   SpO2: 98% 100% 96% 96%  Weight:      Height:        Intake/Output Summary (Last 24 hours) at 04/01/2023 0839 Last data filed at 04/01/2023 0400 Gross per 24 hour  Intake 680 ml  Output 3100 ml  Net -2420 ml   Filed Weights   03/25/23 2145  Weight: 95 kg    Examination:  General exam: Appears calm and comfortable  Respiratory system: Clear to auscultation. Respiratory effort normal. Cardiovascular system: S1 & S2+. No rubs, gallops or clicks.  Gastrointestinal system: Abdomen isobese, soft and nontender. Normal bowel sounds heard. Central nervous system: Alert and oriented. Moves all extremities  Psychiatry: Judgement and insight appear normal. Mood & affect appropriate.     Data Reviewed: I have personally reviewed following labs and imaging studies  CBC: Recent Labs  Lab 03/25/23 1821 03/26/23 0632 03/27/23 0912  WBC 15.2* 11.7* 8.4  NEUTROABS 11.1*  --   --   HGB 15.0 12.6 12.0  HCT 45.0 38.2 36.4  MCV 88.8 89.3 89.7  PLT 389 261 267   Basic Metabolic Panel: Recent Labs  Lab 03/25/23 1822 03/26/23 0632 03/27/23 0912 03/28/23 0153 03/31/23 0414 04/01/23 0154  NA 137 135 139  --   --   --   K 3.5 3.6 3.6  --   --   --   CL 108  109 114*  --   --   --   CO2 18* 19* 21*  --   --   --   GLUCOSE 146* 107* 104*  --   --   --   BUN 22* 17 11  --   --   --   CREATININE 0.70 0.62 0.54 0.53 0.69 0.71  CALCIUM 8.9 8.5* 8.5*  --   --   --    GFR: Estimated Creatinine Clearance: 86.3 mL/min (by C-G formula based on SCr of 0.71 mg/dL). Liver Function Tests: Recent Labs  Lab 03/25/23 1822  AST 15  ALT 11  ALKPHOS 54  BILITOT 0.7  PROT 6.8  ALBUMIN 3.3*   No results for input(s): "LIPASE", "AMYLASE" in the last 168 hours. No results for input(s): "AMMONIA" in the last 168 hours. Coagulation Profile: Recent Labs  Lab 03/25/23 1822  INR 1.1   Cardiac Enzymes: No results for input(s):  "CKTOTAL", "CKMB", "CKMBINDEX", "TROPONINI" in the last 168 hours. BNP (last 3 results) No results for input(s): "PROBNP" in the last 8760 hours. HbA1C: No results for input(s): "HGBA1C" in the last 72 hours. CBG: Recent Labs  Lab 03/31/23 0729 03/31/23 1110 03/31/23 1746 03/31/23 2148 04/01/23 0750  GLUCAP 112* 120* 151* 133* 116*   Lipid Profile: No results for input(s): "CHOL", "HDL", "LDLCALC", "TRIG", "CHOLHDL", "LDLDIRECT" in the last 72 hours. Thyroid Function Tests: No results for input(s): "TSH", "T4TOTAL", "FREET4", "T3FREE", "THYROIDAB" in the last 72 hours. Anemia Panel: No results for input(s): "VITAMINB12", "FOLATE", "FERRITIN", "TIBC", "IRON", "RETICCTPCT" in the last 72 hours. Sepsis Labs: Recent Labs  Lab 03/25/23 1821 03/25/23 1956  LATICACIDVEN 2.0* 1.5    Recent Results (from the past 240 hours)  Culture, blood (routine x 2)     Status: None   Collection Time: 03/25/23  6:21 PM   Specimen: BLOOD RIGHT ARM  Result Value Ref Range Status   Specimen Description BLOOD RIGHT ARM  Final   Special Requests   Final    BOTTLES DRAWN AEROBIC AND ANAEROBIC Blood Culture adequate volume   Culture   Final    NO GROWTH 5 DAYS Performed at [facility], [address] [place], [place] [address]    Report Status 03/30/2023 FINAL  Final  Culture, blood (routine x 2)     Status: None   Collection Time: 03/25/23  7:56 PM   Specimen: BLOOD  Result Value Ref Range Status   Specimen Description BLOOD BLOOD LEFT ARM  Final   Special Requests   Final    BOTTLES DRAWN AEROBIC AND ANAEROBIC Blood Culture results may not be optimal due to an inadequate volume of blood received in culture bottles   Culture   Final    NO GROWTH 5 DAYS Performed at [facility], [address] [place], [place] [address]    Report Status 03/30/2023 FINAL  Final         Radiology Studies: [place] EKG SITE RITE Result Date: 03/30/2023 If Site Rite image not attached,  placement could not be confirmed due to current cardiac rhythm.       Scheduled Meds:  atorvastatin  40 mg Oral QHS   buPROPion  150 mg Oral Daily   citalopram  40 mg Oral Daily   cyclobenzaprine  10 mg Oral TID   feeding supplement  237 mL Oral BID BM   fluticasone  2 spray Each Nare Daily   fluticasone furoate-vilanterol  1 puff Inhalation Daily   And  umeclidinium bromide  1 puff Inhalation Daily   gabapentin  600 mg Oral Q6H   heparin  5,000 Units Subcutaneous Q8H   insulin aspart  0-5 Units Subcutaneous QHS   insulin aspart  0-9 Units Subcutaneous TID WC   midodrine  5 mg Oral TID WC   nicotine  21 mg Transdermal Daily   pantoprazole  40 mg Oral Daily   polyethylene glycol  17 g Oral Daily   QUEtiapine  100 mg Oral QHS   senna-docusate  1 tablet Oral BID   Vitamin D (Ergocalciferol)  50,000 Units Oral Q7 days   Continuous Infusions:  piperacillin-tazobactam (ZOSYN)  IV 3.375 g (04/01/23 0542)   vancomycin Stopped (04/01/23 0330)     LOS: 7 days      Charise Killian, MD [facility] Pager [phone]  If 7PM-7AM, please contact night-coverage [email or web address] 04/01/2023, 8:39 AM

## 2023-04-01 NOTE — Transfer of Care (Signed)
 Immediate Anesthesia Transfer of Care Note  Patient: [name]  Procedure(s) Performed: ENDARTERECTOMY, FEMORAL (Left) APPLICATION OF CELL SAVER (Left)  Patient Location: PACU  Anesthesia Type:General  Level of Consciousness: awake, alert , and drowsy  Airway & Oxygen Therapy: Patient Spontanous Breathing and Patient connected to face mask oxygen  Post-op Assessment: Report given to RN and Post -op Vital signs reviewed and stable  Post vital signs: Reviewed and stable  Last Vitals:  Vitals Value Taken Time  BP 127/60 04/01/23 1630  Temp 36.1 1630  Pulse 85 04/01/23 1632  Resp 15 04/01/23 1632  SpO2 100 % 04/01/23 1632  Vitals shown include unfiled device data.  Last Pain:  Vitals:   04/01/23 1219  TempSrc:   PainSc: 2       Patients Stated Pain Goal: 4 (04/01/23 2725)  Complications: No notable events documented.

## 2023-04-01 NOTE — Anesthesia Preprocedure Evaluation (Signed)
 Anesthesia Evaluation  Patient identified by MRN, date of birth, ID band Patient awake    Reviewed: Allergy & Precautions, H&P , NPO status , Patient's Chart, lab work & pertinent test results, reviewed documented beta blocker date and time   History of Anesthesia Complications (+) Emergence Delirium and history of anesthetic complications  Airway Mallampati: III   Neck ROM: full    Dental  (+) Poor Dentition, Dental Advidsory Given   Pulmonary neg pulmonary ROS, neg shortness of breath, sleep apnea , COPD,  COPD inhaler, neg recent URI, Current Smoker and Patient abstained from smoking.   Pulmonary exam normal        Cardiovascular Exercise Tolerance: Poor hypertension, (-) angina + Peripheral Vascular Disease  (-) Past MI and (-) Cardiac Stents Normal cardiovascular exam(-) dysrhythmias (-) Valvular Problems/Murmurs Rhythm:regular Rate:Normal  ECHO 03/2023: 1. Left ventricular ejection fraction, by estimation, is 55 to 60%. The left ventricle has normal function. The left ventricle has no regional wall motion abnormalities. Left ventricular diastolic parameters were normal.   2. Right ventricular systolic function is normal. The right ventricular size is normal.   3. The mitral valve is normal in structure. Trivial mitral valve regurgitation. No evidence of mitral stenosis.   4. The aortic valve is normal in structure. Aortic valve regurgitation is not visualized. No aortic stenosis is present.   5. The inferior vena cava is normal in size with greater than 50% respiratory variability, suggesting right atrial pressure of 3 mmHg.    Neuro/Psych  Headaches, neg Seizures PSYCHIATRIC DISORDERS Anxiety Depression    TIACVA (right side weakness), Residual Symptoms    GI/Hepatic Neg liver ROS,GERD  ,,  Endo/Other  diabetes, Well Controlled    Renal/GU negative Renal ROS  negative genitourinary   Musculoskeletal   Abdominal    Peds  Hematology negative hematology ROS (+)   Anesthesia Other Findings Past Medical History: No date: Anxiety No date: Cancer [facility])     Comment:  a spot on liver and treated  No date: Complication of anesthesia     Comment:  restless,easily upset No date: COPD (chronic obstructive pulmonary disease) (HCC) No date: Diabetes mellitus without complication (HCC) No date: Headache(784.0) No date: Restless No date: Sleep apnea     Comment:  neg test No date: Stroke [facility]) Past Surgical History: No date: ABDOMINAL HYSTERECTOMY 03/11/2012: ANTERIOR CERVICAL DECOMP/DISCECTOMY FUSION; N/A     Comment:  Procedure: ANTERIOR CERVICAL DECOMPRESSION/DISCECTOMY               FUSION 1 LEVEL;  Surgeon: Cristi Loron, MD;                Location: [facility];  Service: Neurosurgery;                Laterality: N/A;  Cervical five-six anterior cervical               decompression with fusion interbody prothesis plating and              bonegraft No date: BACK SURGERY No date: EYE SURGERY No date: HERNIA REPAIR No date: JOINT REPLACEMENT     Comment:  bil knees No date: REPLACEMENT TOTAL KNEE BILATERAL BMI    Body Mass Index:  52.66 kg/m     Reproductive/Obstetrics negative OB ROS                             Anesthesia Physical Anesthesia  Plan  ASA: 4  Anesthesia Plan: General   Post-op Pain Management:    Induction: Intravenous  PONV Risk Score and Plan: 2 and Ondansetron, Dexamethasone, Midazolam and Treatment may vary due to age or medical condition  Airway Management Planned: Oral ETT and LMA  Additional Equipment:   Intra-op Plan:   Post-operative Plan: Extubation in OR  Informed Consent: I have reviewed the patients History and Physical, chart, labs and discussed the procedure including the risks, benefits and alternatives for the proposed anesthesia with the patient or authorized representative who has indicated his/her understanding and  acceptance.     Dental Advisory Given  Plan Discussed with: CRNA  Anesthesia Plan Comments:         Anesthesia Quick Evaluation

## 2023-04-02 ENCOUNTER — Inpatient Hospital Stay

## 2023-04-02 ENCOUNTER — Encounter
Admission: EM | Disposition: A | Discharge status: Home / Self Care | Payer: Self-pay | Source: Home / Self Care | Attending: Internal Medicine

## 2023-04-02 ENCOUNTER — Inpatient Hospital Stay: Admitting: Anesthesiology

## 2023-04-02 ENCOUNTER — Encounter: Payer: Self-pay | Admitting: Internal Medicine

## 2023-04-02 DIAGNOSIS — M86172 Other acute osteomyelitis, left ankle and foot: Secondary | ICD-10-CM | POA: Diagnosis not present

## 2023-04-02 HISTORY — PX: AMPUTATION TOE: SHX6595

## 2023-04-02 LAB — CBC
HCT: 37 % (ref 36.0–46.0)
Hemoglobin: 11.9 g/dL — ABNORMAL LOW (ref 12.0–15.0)
MCH: 29.8 pg (ref 26.0–34.0)
MCHC: 32.2 g/dL (ref 30.0–36.0)
MCV: 92.7 fL (ref 80.0–100.0)
Platelets: 294 10*3/uL (ref 150–400)
RBC: 3.99 MIL/uL (ref 3.87–5.11)
RDW: 14 % (ref 11.5–15.5)
WBC: 14.3 10*3/uL — ABNORMAL HIGH (ref 4.0–10.5)
nRBC: 0 % (ref 0.0–0.2)

## 2023-04-02 LAB — BASIC METABOLIC PANEL
Anion gap: 10 (ref 5–15)
Anion gap: 9 (ref 5–15)
BUN: 11 mg/dL (ref 6–20)
BUN: 12 mg/dL (ref 6–20)
CO2: 19 mmol/L — ABNORMAL LOW (ref 22–32)
CO2: 22 mmol/L (ref 22–32)
Calcium: 8.7 mg/dL — ABNORMAL LOW (ref 8.9–10.3)
Calcium: 8.9 mg/dL (ref 8.9–10.3)
Chloride: 104 mmol/L (ref 98–111)
Chloride: 107 mmol/L (ref 98–111)
Creatinine, Ser: 0.59 mg/dL (ref 0.44–1.00)
Creatinine, Ser: 0.64 mg/dL (ref 0.44–1.00)
GFR, Estimated: >60 mL/min (ref >60)
GFR, Estimated: >60 mL/min (ref >60)
Glucose, Bld: 203 mg/dL — ABNORMAL HIGH (ref 70–99)
Glucose, Bld: 262 mg/dL — ABNORMAL HIGH (ref 70–99)
Potassium: 4.7 mmol/L (ref 3.5–5.1)
Potassium: 5.5 mmol/L — ABNORMAL HIGH (ref 3.5–5.1)
Sodium: 135 mmol/L (ref 135–145)
Sodium: 136 mmol/L (ref 135–145)

## 2023-04-02 LAB — GLUCOSE, CAPILLARY
Glucose-Capillary: 144 mg/dL — ABNORMAL HIGH (ref 70–99)
Glucose-Capillary: 137 mg/dL — ABNORMAL HIGH (ref 70–99)
Glucose-Capillary: 133 mg/dL — ABNORMAL HIGH (ref 70–99)
Glucose-Capillary: 247 mg/dL — ABNORMAL HIGH (ref 70–99)
Glucose-Capillary: 241 mg/dL — ABNORMAL HIGH (ref 70–99)
Glucose-Capillary: 214 mg/dL — ABNORMAL HIGH (ref 70–99)

## 2023-04-02 MED ORDER — LIDOCAINE HCL 1 % IJ SOLN
INTRAMUSCULAR | Status: DC | PRN
  Administered 2023-04-02: 4 mL

## 2023-04-02 MED ORDER — LIDOCAINE HCL (PF) 2 % IJ SOLN
INTRAMUSCULAR | Status: AC
  Filled 2023-04-02: qty 5

## 2023-04-02 MED ORDER — OXYCODONE HCL 5 MG/5ML PO SOLN: 5.0000 mg | Freq: Once | ORAL | Status: DC | PRN

## 2023-04-02 MED ORDER — PROPOFOL 10 MG/ML IV BOLUS
INTRAVENOUS | Status: AC
  Filled 2023-04-02: qty 20

## 2023-04-02 MED ORDER — FENTANYL CITRATE (PF) 100 MCG/2ML IJ SOLN
INTRAMUSCULAR | Status: AC
  Filled 2023-04-02: qty 2

## 2023-04-02 MED ORDER — LIDOCAINE HCL (CARDIAC) PF 100 MG/5ML IV SOSY
PREFILLED_SYRINGE | INTRAVENOUS | Status: DC | PRN
  Administered 2023-04-02: 60 mg via INTRAVENOUS

## 2023-04-02 MED ORDER — BUPIVACAINE HCL (PF) 0.5 % IJ SOLN
INTRAMUSCULAR | Status: AC
  Filled 2023-04-02: qty 30

## 2023-04-02 MED ORDER — BUPIVACAINE HCL 0.5 % IJ SOLN
INTRAMUSCULAR | Status: DC | PRN
  Administered 2023-04-02: 4 mL

## 2023-04-02 MED ORDER — OXYCODONE HCL 5 MG PO TABS: 5.0000 mg | ORAL_TABLET | Freq: Once | ORAL | Status: DC | PRN

## 2023-04-02 MED ORDER — FENTANYL CITRATE (PF) 100 MCG/2ML IJ SOLN: 25.0000 ug | INTRAMUSCULAR | Status: DC | PRN

## 2023-04-02 MED ORDER — ALBUTEROL SULFATE (2.5 MG/3ML) 0.083% IN NEBU
INHALATION_SOLUTION | RESPIRATORY_TRACT | Status: AC
  Filled 2023-04-02: qty 3

## 2023-04-02 MED ORDER — PROPOFOL 500 MG/50ML IV EMUL
INTRAVENOUS | Status: DC | PRN
  Administered 2023-04-02: 100 ug/kg/min via INTRAVENOUS

## 2023-04-02 MED ORDER — 0.9 % SODIUM CHLORIDE (POUR BTL) OPTIME
TOPICAL | Status: DC | PRN
  Administered 2023-04-02: 60 mL

## 2023-04-02 MED ORDER — ONDANSETRON HCL 4 MG/2ML IJ SOLN
INTRAMUSCULAR | Status: AC
  Filled 2023-04-02: qty 2

## 2023-04-02 MED ORDER — PROPOFOL 1000 MG/100ML IV EMUL
INTRAVENOUS | Status: AC
  Filled 2023-04-02: qty 100

## 2023-04-02 MED ORDER — LIDOCAINE HCL (PF) 1 % IJ SOLN
INTRAMUSCULAR | Status: AC
  Filled 2023-04-02: qty 30

## 2023-04-02 MED ORDER — PHENYLEPHRINE 80 MCG/ML (10ML) SYRINGE FOR IV PUSH (FOR BLOOD PRESSURE SUPPORT)
PREFILLED_SYRINGE | INTRAVENOUS | Status: DC | PRN
  Administered 2023-04-02 (×3): 80 ug via INTRAVENOUS

## 2023-04-02 MED ORDER — MIDAZOLAM HCL 2 MG/2ML IJ SOLN
INTRAMUSCULAR | Status: DC | PRN
  Administered 2023-04-02: 2 mg via INTRAVENOUS

## 2023-04-02 MED ORDER — PROPOFOL 10 MG/ML IV BOLUS
INTRAVENOUS | Status: DC | PRN
  Administered 2023-04-02: 150 mg via INTRAVENOUS

## 2023-04-02 MED ORDER — MIDAZOLAM HCL 2 MG/2ML IJ SOLN
INTRAMUSCULAR | Status: AC
  Filled 2023-04-02: qty 2

## 2023-04-02 SURGICAL SUPPLY — 4 items
ELECT REM PT RETURN 9FT ADLT (ELECTROSURGICAL) ×1 IMPLANT
GLOVE SURG SYN 7.5 E (GLOVE) ×1 IMPLANT
NDL FILTER BLUNT 18X1 1/2 (NEEDLE) ×1 IMPLANT
NDL HYPO 25X1 1.5 SAFETY (NEEDLE) ×2 IMPLANT

## 2023-04-02 NOTE — Progress Notes (Signed)
 PT Cancellation Note  Patient Details Name: [name] MRN: [id number] DOB: 1965/09/17   Cancelled Treatment:    Reason Eval/Treat Not Completed: Other (comment) Orders received, chart reviewed. Planned L great toe amputation for this date. Will re-attempt at later date/time once appropriate.   Maylon Peppers, PT, DPT Physical Therapist - [facility]  [facility]   Fard Borunda A Chardae Mulkern 04/02/2023, 8:48 AM

## 2023-04-02 NOTE — Progress Notes (Signed)
 Progress Note    04/02/2023 9:29 AM * Day of Surgery *  Subjective:  [name] is a 58 yo female who is now POD #1 from left common femoral artery endarterectomy with patch angioplasty and left lower extremity angiogram with stent placement to the left popliteal artery and left proximal SFA.  Patient endorses her left lower extremity feels better this morning.  She endorses it is much warmer than normal.  Denies any pain to her left lower extremity at this point.  No complaints overnight vitals all remained stable.   Vitals:   04/02/23 0805 04/02/23 0916  BP: 106/69 102/80  Pulse: 88 88  Resp: 17 16  Temp:  98.9 F (37.2 C)  SpO2: 97% 96%   Physical Exam: Cardiac:  RRR, Normal S1, S2. No murmurs appreciated.  Lungs:  Clear throughout on auscultation. No rales, rhonchi or wheezing to note.  Incisions:  right groin puncture site with dressing clean dry and intact. No hematoma, seroma to note.  Extremities:  Palpable pulses throughout except unable to palpate or doppler left Dorsalis pedis pulse. Left great toe with open sore and necrotic tip.  Abdomen:  Positive bowel sounds throughout, Soft non tender and non distended.  Neurologic: AAOX3, Answers all questions and follows commands appropriately.  CBC    Component Value Date/Time   WBC 10.7 (H) 04/01/2023 2336   RBC 4.03 04/01/2023 2336   HGB 11.8 (L) 04/01/2023 2336   HGB 14.6 09/22/2013 2251   HCT 35.2 (L) 04/01/2023 2336   HCT 44.3 09/22/2013 2251   PLT 287 04/01/2023 2336   PLT 300 09/22/2013 2251   MCV 87.3 04/01/2023 2336   MCV 90 09/22/2013 2251   MCH 29.3 04/01/2023 2336   MCHC 33.5 04/01/2023 2336   RDW 13.9 04/01/2023 2336   RDW 13.8 09/22/2013 2251   LYMPHSABS 3.0 03/25/2023 1821   LYMPHSABS 4.5 (H) 08/02/2013 2118   MONOABS 0.9 03/25/2023 1821   MONOABS 1.3 (H) 08/02/2013 2118   EOSABS 0.0 03/25/2023 1821   EOSABS 0.1 08/02/2013 2118   BASOSABS 0.0 03/25/2023 1821   BASOSABS 0.0 08/02/2013 2118     BMET    Component Value Date/Time   NA 135 04/02/2023 0406   NA 135 (L) 09/22/2013 2251   K 4.7 04/02/2023 0406   K 3.8 09/22/2013 2251   CL 104 04/02/2023 0406   CL 100 09/22/2013 2251   CO2 22 04/02/2023 0406   CO2 27 09/22/2013 2251   GLUCOSE 203 (H) 04/02/2023 0406   GLUCOSE 335 (H) 09/22/2013 2251   BUN 11 04/02/2023 0406   BUN 11 09/22/2013 2251   CREATININE 0.59 04/02/2023 0406   CREATININE 0.62 05/15/2021 1351   CALCIUM 8.9 04/02/2023 0406   CALCIUM 8.3 (L) 09/22/2013 2251   GFRNONAA >60 04/02/2023 0406   GFRNONAA >60 09/22/2013 2251   GFRAA >60 08/18/2017 0707   GFRAA >60 09/22/2013 2251    INR    Component Value Date/Time   INR 1.1 03/25/2023 1822   INR 0.9 08/28/2012 1927     Intake/Output Summary (Last 24 hours) at 04/02/2023 0929 Last data filed at 04/02/2023 0835 Gross per 24 hour  Intake 3464.43 ml  Output 2680 ml  Net 784.43 ml     Assessment/Plan:  58 y.o. female is s/p left common femoral artery endarterectomy with patch angioplasty and left lower extremity angiogram with stent placement to the left popliteal artery and left proximal SFA.  * Day of Surgery *   PLAN  Patient is scheduled to go to surgery this morning for amputation of her left great toe. Post procedure vascular surgery recommends patient be discontinued from heparin infusion and started on aspirin, Plavix and Lipitor.  Okay per vascular surgery for patient to go to the floor.  DVT prophylaxis: ASA 81 mg daily, Plavix 75 mg daily.   Marcie Bal [facility] 04/02/2023 9:29 AM

## 2023-04-02 NOTE — Progress Notes (Signed)
 PROGRESS NOTE    [name]  ZOX:[id number] DOB: January 28, 1965 DOA: 03/25/2023 PCP: Smitty Cords, DO   Assessment & Plan:   Principal Problem:   Osteomyelitis of left foot (HCC) Active Problems:   PVD (peripheral vascular disease) (HCC)   Type 2 diabetes mellitus with peripheral vascular disease (HCC)   History of cerebrovascular accident (CVA) with residual deficit   HTN (hypertension)   Hyperlipidemia   COPD (chronic obstructive pulmonary disease) (HCC)   Tobacco abuse   Anxiety and depression   Obesity (BMI 30-39.9)   Acute osteomyelitis of toe of left foot (HCC)  Assessment and Plan:  Osteomyelitis of left foot: likely secondary to PAD & DM2. Failed outpatient treatment w/ doxy & cipro. Continue on IV zosyn, vanco   PAD: s/p left femoral endarterectomy & stents placed on 04/01/23 as per vasc surg and s/p amputation of left hallux through distal proximal phalanx 04/02/23 as per podiatry   HLD: continue on statin    DM2: likely well controlled, HbA1c 5.2 in 04/2022. Continue on SSI w/ accuchecks    Hx of CVA: w/ residual deficit. Continue on statin, plavix   COPD: w/o exacerbation. Bronchodilators prn    Tobacco abuse: nicotine patch to prevent w/ drawl. Received smoking cessation counseling x 5 mins    Depression: severity unknown. Continue on home dose of citalopram, bupropion, seroquel    Obesity: Class II. BMI 34.8. Complicates overall care & prognosis    Chronic pain: takes percocet daily at home    DVT prophylaxis: heparin  Code Status: full  Family Communication:  Disposition Plan: likely d/c back home   Level of care: ICU  Status is: Inpatient Remains inpatient appropriate because: severity of illness, surg today      Consultants:  Vasc surg Podiatry   Procedures:  Antimicrobials: vanco, zosyn    Subjective: Pt c/o foot pain   Objective: Vitals:   04/02/23 0600 04/02/23 0700 04/02/23 0740 04/02/23 0805  BP: 91/74 108/70 112/75  106/69  Pulse: 89 87 89 88  Resp: 13 19 18 17   Temp:   98.3 F (36.8 C)   TempSrc:   Oral   SpO2: 98% 96% 98% 97%  Weight:      Height:        Intake/Output Summary (Last 24 hours) at 04/02/2023 0859 Last data filed at 04/02/2023 0835 Gross per 24 hour  Intake 3464.43 ml  Output 2680 ml  Net 784.43 ml   Filed Weights   03/25/23 2145 04/01/23 1219 04/01/23 1850  Weight: 95 kg 95 kg 100.5 kg    Examination:  General exam: Appears uncomfortable  Respiratory system: decreased breath sounds b/l  Cardiovascular system: S1/S2+. No rubs or gallops  Gastrointestinal system: abd is soft, NT, obese & hypoactive bowel sounds. Central nervous system: alert & oriented. Moves all extremities  Psychiatry: Judgement and insight appears at baseline. Flat mood and affect     Data Reviewed: I have personally reviewed following labs and imaging studies  CBC: Recent Labs  Lab 03/27/23 0912 04/01/23 1923 04/01/23 2336  WBC 8.4 11.1* 10.7*  HGB 12.0 12.7 11.8*  HCT 36.4 38.6 35.2*  MCV 89.7 90.0 87.3  PLT 267 279 287   Basic Metabolic Panel: Recent Labs  Lab 03/27/23 0912 03/28/23 0153 03/31/23 0414 04/01/23 0154 04/01/23 1923 04/01/23 2336 04/02/23 0406  NA 139  --   --   --   --  136 135  K 3.6  --   --   --   --  5.5* 4.7  CL 114*  --   --   --   --  107 104  CO2 21*  --   --   --   --  19* 22  GLUCOSE 104*  --   --   --   --  262* 203*  BUN 11  --   --   --   --  12 11  CREATININE 0.54   < > 0.69 0.71 0.61 0.64 0.59  CALCIUM 8.5*  --   --   --   --  8.7* 8.9   < > = values in this interval not displayed.   GFR: Estimated Creatinine Clearance: 90.6 mL/min (by C-G formula based on SCr of 0.59 mg/dL). Liver Function Tests: No results for input(s): "AST", "ALT", "ALKPHOS", "BILITOT", "PROT", "ALBUMIN" in the last 168 hours.  No results for input(s): "LIPASE", "AMYLASE" in the last 168 hours. No results for input(s): "AMMONIA" in the last 168 hours. Coagulation  Profile: No results for input(s): "INR", "PROTIME" in the last 168 hours.  Cardiac Enzymes: No results for input(s): "CKTOTAL", "CKMB", "CKMBINDEX", "TROPONINI" in the last 168 hours. BNP (last 3 results) No results for input(s): "PROBNP" in the last 8760 hours. HbA1C: No results for input(s): "HGBA1C" in the last 72 hours. CBG: Recent Labs  Lab 04/01/23 1222 04/01/23 1633 04/01/23 1847 04/01/23 2108 04/02/23 0725  GLUCAP 104* 178* 198* 218* 144*   Lipid Profile: No results for input(s): "CHOL", "HDL", "LDLCALC", "TRIG", "CHOLHDL", "LDLDIRECT" in the last 72 hours. Thyroid Function Tests: No results for input(s): "TSH", "T4TOTAL", "FREET4", "T3FREE", "THYROIDAB" in the last 72 hours. Anemia Panel: No results for input(s): "VITAMINB12", "FOLATE", "FERRITIN", "TIBC", "IRON", "RETICCTPCT" in the last 72 hours. Sepsis Labs: No results for input(s): "PROCALCITON", "LATICACIDVEN" in the last 168 hours.   Recent Results (from the past 240 hours)  Culture, blood (routine x 2)     Status: None   Collection Time: 03/25/23  6:21 PM   Specimen: BLOOD RIGHT ARM  Result Value Ref Range Status   Specimen Description BLOOD RIGHT ARM  Final   Special Requests   Final    BOTTLES DRAWN AEROBIC AND ANAEROBIC Blood Culture adequate volume   Culture   Final    NO GROWTH 5 DAYS Performed at [facility], [address] [place], [place] [address]    Report Status 03/30/2023 FINAL  Final  Culture, blood (routine x 2)     Status: None   Collection Time: 03/25/23  7:56 PM   Specimen: BLOOD  Result Value Ref Range Status   Specimen Description BLOOD BLOOD LEFT ARM  Final   Special Requests   Final    BOTTLES DRAWN AEROBIC AND ANAEROBIC Blood Culture results may not be optimal due to an inadequate volume of blood received in culture bottles   Culture   Final    NO GROWTH 5 DAYS Performed at [facility] [address], [place], [place] [address]    Report Status  03/30/2023 FINAL  Final  MRSA Next Gen by PCR, Nasal     Status: None   Collection Time: 04/01/23  6:49 PM   Specimen: Nasal Mucosa; Nasal Swab  Result Value Ref Range Status   MRSA by PCR Next Gen NOT DETECTED NOT DETECTED Final    Comment: (NOTE) The GeneXpert MRSA Assay (FDA approved for NASAL specimens only), is one component of a comprehensive MRSA colonization surveillance program. It is not intended to diagnose MRSA infection  nor to guide or monitor treatment for MRSA infections. Test performance is not FDA approved in patients less than 5 years old. Performed at [facility], [address] [place], [place] [address]          Radiology Studies: DG C-Arm 1-60 Min-No Report Result Date: 04/01/2023 Fluoroscopy was utilized by the requesting physician.  No radiographic interpretation.   DG C-Arm 1-60 Min-No Report Result Date: 04/01/2023 Fluoroscopy was utilized by the requesting physician.  No radiographic interpretation.        Scheduled Meds:  aspirin EC  81 mg Oral Q0600   atorvastatin  40 mg Oral QHS   buPROPion  150 mg Oral Daily   Chlorhexidine Gluconate Cloth  6 each Topical Daily   citalopram  40 mg Oral Daily   clopidogrel  75 mg Oral Q0600   cyclobenzaprine  10 mg Oral TID   docusate sodium  100 mg Oral Daily   feeding supplement  237 mL Oral BID BM   fluticasone  2 spray Each Nare Daily   fluticasone furoate-vilanterol  1 puff Inhalation Daily   And   umeclidinium bromide  1 puff Inhalation Daily   gabapentin  600 mg Oral Q6H   heparin  5,000 Units Subcutaneous Q8H   insulin aspart  0-5 Units Subcutaneous QHS   insulin aspart  0-9 Units Subcutaneous TID WC   midodrine  5 mg Oral TID WC   nicotine  21 mg Transdermal Daily   pantoprazole  40 mg Oral Daily   polyethylene glycol  17 g Oral BID   QUEtiapine  100 mg Oral QHS   senna-docusate  2 tablet Oral BID   Vitamin D (Ergocalciferol)  50,000 Units Oral Q7 days   Continuous  Infusions:  sodium chloride Stopped (04/02/23 0536)   sodium chloride     DOPamine     famotidine (PEPCID) IV Stopped (04/01/23 2208)   magnesium sulfate bolus IVPB     nitroGLYCERIN     piperacillin-tazobactam (ZOSYN)  IV 12.5 mL/hr at 04/02/23 0600   vancomycin Stopped (04/02/23 0347)     LOS: 8 days      Charise Killian, MD [facility] Pager [phone]  If 7PM-7AM, please contact night-coverage [email or web address] 04/02/2023, 8:59 AM

## 2023-04-02 NOTE — Plan of Care (Signed)
  Problem: Coping: Goal: Ability to adjust to condition or change in health will improve Outcome: Progressing   Problem: Fluid Volume: Goal: Ability to maintain a balanced intake and output will improve Outcome: Progressing   Problem: Metabolic: Goal: Ability to maintain appropriate glucose levels will improve Outcome: Progressing   Problem: Nutritional: Goal: Maintenance of adequate nutrition will improve Outcome: Progressing   Problem: Skin Integrity: Goal: Risk for impaired skin integrity will decrease Outcome: Progressing   Problem: Tissue Perfusion: Goal: Adequacy of tissue perfusion will improve Outcome: Progressing   Problem: Education: Goal: Knowledge of General Education information will improve Description: Including pain rating scale, medication(s)/side effects and non-pharmacologic comfort measures Outcome: Progressing   Problem: Health Behavior/Discharge Planning: Goal: Ability to manage health-related needs will improve Outcome: Progressing   Problem: Clinical Measurements: Goal: Diagnostic test results will improve Outcome: Progressing Goal: Respiratory complications will improve Outcome: Progressing

## 2023-04-02 NOTE — Progress Notes (Signed)
 OT Cancellation Note  Patient Details Name: [name] MRN: [id number] DOB: 10/29/1965   Cancelled Treatment:    Reason Eval/Treat Not Completed: Patient at procedure or test/ unavailable. Per chart review, planned L great toe amputation for this date. OT to re-attempt when pt is next available.   Jackquline Denmark, MS, OTR/L , CBIS ascom [phone]  04/02/23, 9:39 AM

## 2023-04-02 NOTE — Transfer of Care (Signed)
 Immediate Anesthesia Transfer of Care Note  Patient: [name]  Procedure(s) Performed: AMPUTATION, TOE (Left: Toe)  Patient Location: PACU  Anesthesia Type:MAC and General  Level of Consciousness: awake, alert , oriented, and patient cooperative  Airway & Oxygen Therapy: Patient Spontanous Breathing  Post-op Assessment: Report given to RN and Post -op Vital signs reviewed and stable  Post vital signs: Reviewed and stable  Last Vitals:  Vitals Value Taken Time  BP 93/57 04/02/23 1036  Temp 37.1 C 04/02/23 1036  Pulse 93 04/02/23 1040  Resp 16 04/02/23 1040  SpO2 96 % 04/02/23 1040  Vitals shown include unfiled device data.  Last Pain:  Vitals:   04/02/23 0916  TempSrc: Temporal  PainSc: 7       Patients Stated Pain Goal: 0 (04/02/23 0916)  Complications: No notable events documented.

## 2023-04-02 NOTE — Progress Notes (Signed)
 eLink Physician-Brief Progress Note Patient Name: SATIN BOAL DOB: 06-Apr-1965 MRN: [id number]   Date of Service  04/02/2023  HPI/Events of Note  58 y.o. female with medical history significant of osteomyelitis of left great toe, HTN, HLD, DM, COPD, P:VD, stroke with right-sided weakness,, depression with anxiety, migraine, kidney stone, diverticulitis, tobacco abuse, is now status post left common femoral and more distal femoral artery endarterectomies with stent placement.  Vital signs within normal limits.  Off phenylephrine infusion.  Metabolic panel with none anion gap metabolic acidosis and hyperkalemia.  Mild leukocytosis and mild anemia  eICU Interventions  Maintain dual antiplatelet therapy, atorvastatin  Midodrine, weaned off phenylephrine.  Off dopamine and nitroglycerin.  DVT prophylaxis with heparin subcutaneous GI prophylaxis not indicated, home pantoprazole     Intervention Category Evaluation Type: New Patient Evaluation  Moris Ratchford 04/02/2023, 1:11 AM

## 2023-04-02 NOTE — Op Note (Signed)
 Full Operative Report  Date of Operation: 10:37 AM, 04/02/2023   Patient: [name] - 58 y.o. female  Surgeon: Pilar Plate, DPM   Assistant: None  Diagnosis: osteomyelitis of left great toe  Procedure:  1. Amputation of left hallux through distal proximal phalanx    Anesthesia: General  Piscitello, Cleda Mccreedy, MD  Anesthesiologist: Piscitello, Cleda Mccreedy, MD CRNA: Rich Brave, CRNA   Estimated Blood Loss: Minimal   Hemostasis: 1) Anatomical dissection, mechanical compression, electrocautery 2) No tourniquet was used during procedure  Implants: * No implants in log *  Materials: Prolene 3-0   Injectables: 1) Pre-operatively: 8 cc of 50:50 mixture 1%lidocaine plain and 0.5% marcaine plain 2) Post-operatively: None   Specimens: Pathology: Left hallux for pathology  Microbiology: none   Antibiotics: IV antibiotics given per schedule on the floor  Drains: None  Complications: Patient tolerated the procedure well without complication.   Operative findings: As below in detailed report  Indications for Procedure: [name] presents to Pilar Plate, DPM with a chief complaint of osteomyelitis of distal aspect left hallux 2/2 PVD. The patient has failed conservative treatments of various modalities. At this time the patient has elected to proceed with surgical correction. All alternatives, risks, and complications of the procedures were thoroughly explained to the patient. Patient exhibits appropriate understanding of all discussion points and informed consent was signed and obtained in the chart with no guarantees to surgical outcome given or implied.  Description of Procedure: Patient was brought to the operating room. Patient remained on their hospital bed in the supine position. A surgical timeout was performed and all members of the operating room, the procedure, and the surgical site were identified. anesthesia occurred as per  anesthesia record. Local anesthetic as previously described was then injected about the operative field in a local infiltrative block.  The operative lower extremity as noted above was then prepped and draped in the usual sterile [name]. The following procedure then began.  Attention was directed to the 1st digit on the LEFT foot. A full-thickness incision encompassing the entire digit was made using a #15 blade. Dissection was carried down to bone. The toe was secured with a towel clamp, further dissected in its entirety, and disarticulated at the IPJ and passed to the back table as a gross specimen. This was then labled and sent to pathology. The bone was noted to be soft and eroded, and consistent with osteomyelitis. All remaining necrotic and devitalized soft tissue structures were visualized and dissected away using sharp and dull dissection. A sagittal saw was then used to make a transverse osteotomy through the distal proximal phalanx to allow for closure. Care was taken to protect all neurovascular structures throughout the dissection. All bleeders were cauterized as necessary. The area was then flushed with copious amounts of sterile saline. Then using the suture materials previously described, the site was closed in anatomic layers and the skin was well approximated under minimal tension.   The surgical site was then dressed with xerform 4x4 kerlix and ace wrap. The patient tolerated both the procedure and anesthesia well with vital signs stable throughout. The patient was transferred in good condition and all vital signs stable  from the OR to recovery under the discretion of anesthesia.  Condition: Vital signs stable, neurovascular status unchanged from preoperative   Surgical plan:  Clean margin, no further surgical plans. Dressing change over weekend. WBAT in post op shoe. 7 days po abx for amp site.  The patient will be WBAT in a post op shoe to the operative limb until further  instructed. The dressing is to remain clean, dry, and intact. Will continue to follow unless noted elsewhere.   Carlena Hurl, DPM [facility]

## 2023-04-02 NOTE — Progress Notes (Signed)
 History and Physical Interval Note:  04/02/2023 9:50 AM  [name]  has presented today for surgery, with the diagnosis of osteomyelitis of left hallux.  The various methods of treatment have been discussed with the patient and family. After consideration of risks, benefits and other options for treatment, the patient has consented to   Procedure(s) with comments: AMPUTATION, TOE (Left) - LEFT GREAT TOE as a surgical intervention.  The patient's history has been reviewed, patient examined, no change in status, stable for surgery.  I have reviewed the patient's chart and labs.  Questions were answered to the patient's satisfaction.     Jenelle Mages Nicolaus Andel

## 2023-04-02 NOTE — Anesthesia Preprocedure Evaluation (Signed)
 Anesthesia Evaluation  Patient identified by MRN, date of birth, ID band Patient awake    Reviewed: Allergy & Precautions, NPO status , Patient's Chart, lab work & pertinent test results  History of Anesthesia Complications (+) AWARENESS UNDER ANESTHESIA and history of anesthetic complications  Airway Mallampati: III  TM Distance: <3 FB Neck ROM: full    Dental  (+) Upper Dentures, Lower Dentures   Pulmonary shortness of breath and with exertion, COPD, Current Smoker and Patient abstained from smoking.   Pulmonary exam normal        Cardiovascular hypertension, (-) angina Normal cardiovascular exam     Neuro/Psych  Headaches TIACVA  negative psych ROS   GI/Hepatic Neg liver ROS,GERD  Controlled,,  Endo/Other  negative endocrine ROSdiabetes, Type 2    Renal/GU negative Renal ROS  negative genitourinary   Musculoskeletal   Abdominal   Peds  Hematology negative hematology ROS (+)   Anesthesia Other Findings Past Medical History: No date: Anxiety No date: Cancer [facility])     Comment:  a spot on liver and treated  No date: Complication of anesthesia     Comment:  restless,easily upset No date: COPD (chronic obstructive pulmonary disease) (HCC) No date: Depression No date: Diabetes mellitus without complication (HCC) No date: Diverticulitis No date: Fatty liver No date: GERD (gastroesophageal reflux disease) No date: Headache(784.0)     Comment:  migraines No date: History of kidney stones No date: Hyperlipidemia No date: Hypertension No date: PAD (peripheral artery disease) (HCC) No date: Pneumonia No date: Restless No date: Stroke [facility])  Past Surgical History: No date: ABDOMINAL HYSTERECTOMY 08/18/2017: ACHILLES TENDON LENGTHENING; Right     Comment:  Procedure: ACHILLES TENDON LENGTHENING;  Surgeon: Myrene Galas, MD;  Location: [facility] OR;  Service: Orthopedics;                Laterality:  Right; 03/11/2012: ANTERIOR CERVICAL DECOMP/DISCECTOMY FUSION; N/A     Comment:  Procedure: ANTERIOR CERVICAL DECOMPRESSION/DISCECTOMY               FUSION 1 LEVEL;  Surgeon: Cristi Loron, MD;                Location: [facility] NEURO ORS;  Service: Neurosurgery;                Laterality: N/A;  Cervical five-six anterior cervical               decompression with fusion interbody prothesis plating and              bonegraft No date: BACK SURGERY 08/10/2017: EXTERNAL FIXATION LEG; Right     Comment:  Procedure: EXTERNAL FIXATION ANKLE;  Surgeon: Donato Heinz, MD;  Location: [facility] ORS;  Service: Orthopedics;               Laterality: Right; 08/18/2017: EXTERNAL FIXATION REMOVAL; Right     Comment:  Procedure: REMOVAL EXTERNAL FIXATION LEG;  Surgeon:               Myrene Galas, MD;  Location: [facility] OR;  Service:               Orthopedics;  Laterality: Right; No date: EYE SURGERY No date: HERNIA REPAIR 12/09/2021: IR FL GUIDED LOC OF NEEDLE/CATH TIP FOR SPINAL INJECTION  LT 12/09/2021: IR FL GUIDED LOC OF  NEEDLE/CATH TIP FOR SPINAL INJECTION  RT No date: JOINT REPLACEMENT     Comment:  bil knees 03/27/2023: LOWER EXTREMITY ANGIOGRAPHY; Left     Comment:  Procedure: Lower Extremity Angiography;  Surgeon:               Renford Dills, MD;  Location: [facility] INVASIVE CV LAB;               Service: Cardiovascular;  Laterality: Left; 08/18/2017: ORIF ANKLE FRACTURE; Right     Comment:  Procedure: OPEN REDUCTION INTERNAL FIXATION (ORIF) ANKLE              FRACTURE;  Surgeon: Myrene Galas, MD;  Location: [facility] OR;              Service: Orthopedics;  Laterality: Right; No date: REPLACEMENT TOTAL KNEE BILATERAL  BMI    Body Mass Index: 35.76 kg/m      Reproductive/Obstetrics negative OB ROS                             Anesthesia Physical Anesthesia Plan  ASA: 3  Anesthesia Plan: General   Post-op Pain Management:    Induction: Intravenous  PONV  Risk Score and Plan: Propofol infusion and TIVA  Airway Management Planned: Natural Airway and Nasal Cannula  Additional Equipment:   Intra-op Plan:   Post-operative Plan:   Informed Consent: I have reviewed the patients History and Physical, chart, labs and discussed the procedure including the risks, benefits and alternatives for the proposed anesthesia with the patient or authorized representative who has indicated his/her understanding and acceptance.     Dental Advisory Given  Plan Discussed with: Anesthesiologist, CRNA and Surgeon  Anesthesia Plan Comments: (Patient consented for risks of anesthesia including but not limited to:  - adverse reactions to medications - risk of airway placement if required - damage to eyes, teeth, lips or other oral mucosa - nerve damage due to positioning  - sore throat or hoarseness - Damage to heart, brain, nerves, lungs, other parts of body or loss of life  Patient voiced understanding and assent.)       Anesthesia Quick Evaluation

## 2023-04-02 NOTE — Anesthesia Postprocedure Evaluation (Signed)
 Anesthesia Post Note  Patient: [name]  Procedure(s) Performed: ENDARTERECTOMY, FEMORAL (Left) APPLICATION OF CELL SAVER (Left)  Patient location during evaluation: ICU Anesthesia Type: General Level of consciousness: awake and alert and oriented Pain management: pain level controlled Vital Signs Assessment: post-procedure vital signs reviewed and stable Respiratory status: respiratory function stable Cardiovascular status: stable Postop Assessment: no apparent nausea or vomiting and adequate PO intake Anesthetic complications: no   No notable events documented.   Last Vitals:  Vitals:   04/02/23 0600 04/02/23 0700  BP: 91/74 108/70  Pulse: 89 87  Resp: 13 19  Temp:    SpO2: 98% 96%    Last Pain:  Vitals:   04/02/23 0717  TempSrc:   PainSc: 8                  Zachary George

## 2023-04-02 NOTE — Anesthesia Postprocedure Evaluation (Signed)
 Anesthesia Post Note  Patient: [name]  Procedure(s) Performed: AMPUTATION, TOE (Left: Toe)  Patient location during evaluation: PACU Anesthesia Type: MAC Level of consciousness: awake and alert Pain management: pain level controlled Vital Signs Assessment: post-procedure vital signs reviewed and stable Respiratory status: spontaneous breathing, nonlabored ventilation, respiratory function stable and patient connected to nasal cannula oxygen Cardiovascular status: blood pressure returned to baseline and stable Postop Assessment: no apparent nausea or vomiting Anesthetic complications: no   No notable events documented.   Last Vitals:  Vitals:   04/02/23 1100 04/02/23 1110  BP: 105/76 113/73  Pulse: 96 91  Resp: 16 16  Temp: 36.9 C 37.2 C  SpO2: 96% 96%    Last Pain:  Vitals:   04/02/23 1125  TempSrc:   PainSc: 5                  Cleda Mccreedy Mj Willis

## 2023-04-03 ENCOUNTER — Other Ambulatory Visit: Payer: Self-pay

## 2023-04-03 ENCOUNTER — Encounter: Payer: Self-pay | Admitting: Podiatry

## 2023-04-03 DIAGNOSIS — M86172 Other acute osteomyelitis, left ankle and foot: Secondary | ICD-10-CM | POA: Diagnosis not present

## 2023-04-03 LAB — BASIC METABOLIC PANEL
Anion gap: 9 (ref 5–15)
BUN: 11 mg/dL (ref 6–20)
CO2: 25 mmol/L (ref 22–32)
Calcium: 8.7 mg/dL — ABNORMAL LOW (ref 8.9–10.3)
Chloride: 106 mmol/L (ref 98–111)
Creatinine, Ser: 0.66 mg/dL (ref 0.44–1.00)
GFR, Estimated: >60 mL/min (ref >60)
Glucose, Bld: 158 mg/dL — ABNORMAL HIGH (ref 70–99)
Potassium: 4 mmol/L (ref 3.5–5.1)
Sodium: 140 mmol/L (ref 135–145)

## 2023-04-03 LAB — SURGICAL PATHOLOGY

## 2023-04-03 LAB — GLUCOSE, CAPILLARY
Glucose-Capillary: 114 mg/dL — ABNORMAL HIGH (ref 70–99)
Glucose-Capillary: 206 mg/dL — ABNORMAL HIGH (ref 70–99)
Glucose-Capillary: 136 mg/dL — ABNORMAL HIGH (ref 70–99)
Glucose-Capillary: 151 mg/dL — ABNORMAL HIGH (ref 70–99)

## 2023-04-03 MED ORDER — OXYCODONE-ACETAMINOPHEN 5-325 MG PO TABS: 2.0000 | ORAL_TABLET | Freq: Four times a day (QID) | ORAL | 0 refills | Status: DC | PRN

## 2023-04-03 MED ORDER — OXYCODONE-ACETAMINOPHEN 5-325 MG PO TABS
2.0000 | ORAL_TABLET | Freq: Four times a day (QID) | ORAL | Status: DC | PRN
  Administered 2023-04-03 – 2023-04-04 (×4): 2 via ORAL
  Filled 2023-04-03 (×4): qty 2

## 2023-04-03 MED ORDER — FAMOTIDINE 20 MG PO TABS
20.0000 mg | ORAL_TABLET | Freq: Every day | ORAL | Status: DC
  Administered 2023-04-03 – 2023-04-04 (×2): 20 mg via ORAL
  Filled 2023-04-03 (×2): qty 1

## 2023-04-03 NOTE — Progress Notes (Addendum)
 Progress Note    04/03/2023 9:45 AM 1 Day Post-Op  Subjective:  [name] is a 58 yo female who is now POD #2 from left common femoral artery endarterectomy with patch angioplasty and left lower extremity angiogram with stent placement to the left popliteal artery and left proximal SFA. POD #1 from Amputation of left great toe.  Patient endorses her left lower extremity feels better this morning.  She endorses it is much warmer than normal.  Denies any pain to her left lower extremity at this point.  No complaints overnight vitals all remained stable.    Vitals:   04/03/23 0437 04/03/23 0832  BP: 108/65 110/63  Pulse: 87 91  Resp: 16 18  Temp: 98 F (36.7 C) 98.3 F (36.8 C)  SpO2: 96% 98%   Physical Exam: Cardiac:  RRR, Normal S1, S2. No murmurs appreciated.  Lungs:  Clear throughout on auscultation. No rales, rhonchi or wheezing to note.  Incisions:  right groin puncture site with dressing clean dry and intact. No hematoma, seroma to note. Left  Groin incision with staples clean dry and intact.  Extremities:  Palpable pulses throughout except only doppler left Dorsalis pedis and post tibial pulse. Left great toe has been amputated.  Abdomen:  Positive bowel sounds throughout, Soft non tender and non distended.  Neurologic: AAOX3, Answers all questions and follows commands appropriately.  CBC    Component Value Date/Time   WBC 14.3 (H) 04/02/2023 1315   RBC 3.99 04/02/2023 1315   HGB 11.9 (L) 04/02/2023 1315   HGB 14.6 09/22/2013 2251   HCT 37.0 04/02/2023 1315   HCT 44.3 09/22/2013 2251   PLT 294 04/02/2023 1315   PLT 300 09/22/2013 2251   MCV 92.7 04/02/2023 1315   MCV 90 09/22/2013 2251   MCH 29.8 04/02/2023 1315   MCHC 32.2 04/02/2023 1315   RDW 14.0 04/02/2023 1315   RDW 13.8 09/22/2013 2251   LYMPHSABS 3.0 03/25/2023 1821   LYMPHSABS 4.5 (H) 08/02/2013 2118   MONOABS 0.9 03/25/2023 1821   MONOABS 1.3 (H) 08/02/2013 2118   EOSABS 0.0 03/25/2023 1821    EOSABS 0.1 08/02/2013 2118   BASOSABS 0.0 03/25/2023 1821   BASOSABS 0.0 08/02/2013 2118    BMET    Component Value Date/Time   NA 140 04/03/2023 0455   NA 135 (L) 09/22/2013 2251   K 4.0 04/03/2023 0455   K 3.8 09/22/2013 2251   CL 106 04/03/2023 0455   CL 100 09/22/2013 2251   CO2 25 04/03/2023 0455   CO2 27 09/22/2013 2251   GLUCOSE 158 (H) 04/03/2023 0455   GLUCOSE 335 (H) 09/22/2013 2251   BUN 11 04/03/2023 0455   BUN 11 09/22/2013 2251   CREATININE 0.66 04/03/2023 0455   CREATININE 0.62 05/15/2021 1351   CALCIUM 8.7 (L) 04/03/2023 0455   CALCIUM 8.3 (L) 09/22/2013 2251   GFRNONAA >60 04/03/2023 0455   GFRNONAA >60 09/22/2013 2251   GFRAA >60 08/18/2017 0707   GFRAA >60 09/22/2013 2251    INR    Component Value Date/Time   INR 1.1 03/25/2023 1822   INR 0.9 08/28/2012 1927     Intake/Output Summary (Last 24 hours) at 04/03/2023 0945 Last data filed at 04/03/2023 0439 Gross per 24 hour  Intake 790.06 ml  Output 3590 ml  Net -2799.94 ml     Assessment/Plan:  58 y.o. female is s/p left common femoral artery endarterectomy with patch angioplasty and left lower extremity angiogram with stent placement to  the left popliteal artery and left proximal SFA  1 Day Post-Op   PLAN Okay per Vascular Surgery for patient to discharge home today. Patient's on a pain contract. Therefore I will provide 7 days of pain medication for post operative care. Patient states she has an appointment with Dr Tama Headings  next Wednesday as she has run out of her medication. If patient needs more pain medication after the prescription given today, she was instructed to follow up with Dr Jenne Pane as she is on a pain contract or she will void her pain contract. Patient verbalizes her understanding. Patient to follow up with [facility] as scheduled.  Patient was also instructed she will need to take aspirin 81 mg daily and Plavix 75 mg daily.  She was instructed not to skip or miss any of these  medications as they will interfere with the outcome of her surgery.  Again she verbalized her understanding.   DVT prophylaxis:  ASA 81 mg daily, Plavix 75 mg daily.    Marcie Bal [facility] 04/03/2023 9:45 AM

## 2023-04-03 NOTE — Progress Notes (Signed)
 PROGRESS NOTE    [name]  ZOX:[id number] DOB: [birth year] DOA: 03/25/2023 PCP: Smitty Cords, DO   Assessment & Plan:   Principal Problem:   Osteomyelitis of left foot (HCC) Active Problems:   PVD (peripheral vascular disease) (HCC)   Type 2 diabetes mellitus with peripheral vascular disease (HCC)   History of cerebrovascular accident (CVA) with residual deficit   HTN (hypertension)   Hyperlipidemia   COPD (chronic obstructive pulmonary disease) (HCC)   Tobacco abuse   Anxiety and depression   Obesity (BMI 30-39.9)   Acute osteomyelitis of toe of left foot (HCC)  Assessment and Plan:  Osteomyelitis of left foot: likely secondary to PAD & DM2. Failed outpatient treatment w/ doxy & cipro. Continue on IV zosyn while inpatient. D/c vanco. Will d/c w/ keflex x 7 days as per podiatry. WBAT in post op shoe & dressing to remain C/D/I until f/u as per podiatry   PAD: s/p left femoral endarterectomy & stents placed on 04/01/23 as per vasc surg and s/p amputation of left hallux through distal proximal phalanx 04/02/23 as per podiatry. Continue on aspirin, plavix   HLD: continue on statin   DM2: likely well controlled, HbA1c 5.2 in 04/2022. Continue on SSI w/ accuchecks    Hx of CVA: w/ residual deficit. Continue on aspirin, statin   COPD: w/o exacerbation. Bronchodilators prn     Tobacco abuse: nicotine patch to prevent w/ drawl. Received smoking cessation counseling x 5 mins    Depression: severity unknown. Continue on home dose of citalopram, bupropion, seroquel    Obesity: Class II. BMI 34.8. Complicates overall care & prognosis   Chronic pain: takes percocet daily at home    DVT prophylaxis: heparin  Code Status: full  Family Communication:  Disposition Plan: likely d/c back home   Level of care: Telemetry Medical  Status is: Inpatient Remains inpatient appropriate because: severity of illness, will d/c home tomorrow if pt remains stable.      Consultants:  Vasc surg Podiatry   Procedures:  Antimicrobials: zosyn    Subjective: Pt c/o a lot of foot pain   Objective: Vitals:   04/02/23 1627 04/02/23 2009 04/02/23 2352 04/03/23 0437  BP: 110/67 108/63 102/64 108/65  Pulse: 91 89 90 87  Resp:  20 18 16   Temp: 98.5 F (36.9 C) 98.7 F (37.1 C) 98.4 F (36.9 C) 98 F (36.7 C)  TempSrc: Oral  Oral   SpO2: 97% 100% 97% 96%  Weight:      Height:        Intake/Output Summary (Last 24 hours) at 04/03/2023 0819 Last data filed at 04/03/2023 0439 Gross per 24 hour  Intake 790.06 ml  Output 3940 ml  Net -3149.94 ml   Filed Weights   04/01/23 1219 04/01/23 1850 04/02/23 0916  Weight: 95 kg 100.5 kg 100.5 kg    Examination:  General exam: Appears calm but uncomfortable  Respiratory system: diminished breath sounds b/l  Cardiovascular system: S1 & S2+. No rubs or clicks  Gastrointestinal system: abd is soft, NT, obese & hypoactive bowel sounds Central nervous system: alert & oriented. Moves all extremities  Psychiatry: Judgement and insight appears at baseline. Flat mood and affect    Data Reviewed: I have personally reviewed following labs and imaging studies  CBC: Recent Labs  Lab 03/27/23 0912 04/01/23 1923 04/01/23 2336 04/02/23 1315  WBC 8.4 11.1* 10.7* 14.3*  HGB 12.0 12.7 11.8* 11.9*  HCT 36.4 38.6 35.2* 37.0  MCV 89.7 90.0  87.3 92.7  PLT 267 279 287 294   Basic Metabolic Panel: Recent Labs  Lab 03/27/23 0912 03/28/23 0153 04/01/23 0154 04/01/23 1923 04/01/23 2336 04/02/23 0406 04/03/23 0455  NA 139  --   --   --  136 135 140  K 3.6  --   --   --  5.5* 4.7 4.0  CL 114*  --   --   --  107 104 106  CO2 21*  --   --   --  19* 22 25  GLUCOSE 104*  --   --   --  262* 203* 158*  BUN 11  --   --   --  12 11 11   CREATININE 0.54   < > 0.71 0.61 0.64 0.59 0.66  CALCIUM 8.5*  --   --   --  8.7* 8.9 8.7*   < > = values in this interval not displayed.   GFR: Estimated Creatinine  Clearance: 90.6 mL/min (by C-G formula based on SCr of 0.66 mg/dL). Liver Function Tests: No results for input(s): "AST", "ALT", "ALKPHOS", "BILITOT", "PROT", "ALBUMIN" in the last 168 hours.  No results for input(s): "LIPASE", "AMYLASE" in the last 168 hours. No results for input(s): "AMMONIA" in the last 168 hours. Coagulation Profile: No results for input(s): "INR", "PROTIME" in the last 168 hours.  Cardiac Enzymes: No results for input(s): "CKTOTAL", "CKMB", "CKMBINDEX", "TROPONINI" in the last 168 hours. BNP (last 3 results) No results for input(s): "PROBNP" in the last 8760 hours. HbA1C: No results for input(s): "HGBA1C" in the last 72 hours. CBG: Recent Labs  Lab 04/02/23 1043 04/02/23 1526 04/02/23 2015 04/02/23 2218 04/03/23 0722  GLUCAP 133* 247* 241* 214* 114*   Lipid Profile: No results for input(s): "CHOL", "HDL", "LDLCALC", "TRIG", "CHOLHDL", "LDLDIRECT" in the last 72 hours. Thyroid Function Tests: No results for input(s): "TSH", "T4TOTAL", "FREET4", "T3FREE", "THYROIDAB" in the last 72 hours. Anemia Panel: No results for input(s): "VITAMINB12", "FOLATE", "FERRITIN", "TIBC", "IRON", "RETICCTPCT" in the last 72 hours. Sepsis Labs: No results for input(s): "PROCALCITON", "LATICACIDVEN" in the last 168 hours.   Recent Results (from the past 240 hours)  Culture, blood (routine x 2)     Status: None   Collection Time: 03/25/23  6:21 PM   Specimen: BLOOD RIGHT ARM  Result Value Ref Range Status   Specimen Description BLOOD RIGHT ARM  Final   Special Requests   Final    BOTTLES DRAWN AEROBIC AND ANAEROBIC Blood Culture adequate volume   Culture   Final    NO GROWTH 5 DAYS Performed at [facility], [address] [place], [place] [address]    Report Status 03/30/2023 FINAL  Final  Culture, blood (routine x 2)     Status: None   Collection Time: 03/25/23  7:56 PM   Specimen: BLOOD  Result Value Ref Range Status   Specimen Description BLOOD BLOOD  LEFT ARM  Final   Special Requests   Final    BOTTLES DRAWN AEROBIC AND ANAEROBIC Blood Culture results may not be optimal due to an inadequate volume of blood received in culture bottles   Culture   Final    NO GROWTH 5 DAYS Performed at [facility], [address], [place], [place] [address]    Report Status 03/30/2023 FINAL  Final  MRSA Next Gen by PCR, Nasal     Status: None   Collection Time: 04/01/23  6:49 PM   Specimen: Nasal Mucosa; Nasal Swab  Result  Value Ref Range Status   MRSA by PCR Next Gen NOT DETECTED NOT DETECTED Final    Comment: (NOTE) The GeneXpert MRSA Assay (FDA approved for NASAL specimens only), is one component of a comprehensive MRSA colonization surveillance program. It is not intended to diagnose MRSA infection nor to guide or monitor treatment for MRSA infections. Test performance is not FDA approved in patients less than 91 years old. Performed at [facility], [address], [place], [place] [address]          Radiology Studies: DG Foot 2 Views Left Result Date: 04/02/2023 CLINICAL DATA:  Status post amputation EXAM: LEFT FOOT - 2 VIEW COMPARISON:  Preop x-ray 03/25/2023 of the toes FINDINGS: Interval amputation of the distal phalanx of the great toe in the distal half of the proximal phalanx of the great toe. Adjacent soft tissue thickening. Overlapping bandage. Osteopenia. Vascular calcifications. No fracture or dislocation otherwise. Imaging was obtained to aid in treatment. IMPRESSION: Interval amputation of the great toe involving the distal phalanx and the distal half of the proximal phalanx. Electronically Signed   By: Karen Kays M.D.   On: 04/02/2023 14:48   DG C-Arm 1-60 Min-No Report Result Date: 04/01/2023 Fluoroscopy was utilized by the requesting physician.  No radiographic interpretation.   DG C-Arm 1-60 Min-No Report Result Date: 04/01/2023 Fluoroscopy was utilized by the requesting physician.  No radiographic  interpretation.        Scheduled Meds:  aspirin EC  81 mg Oral Q0600   atorvastatin  40 mg Oral QHS   buPROPion  150 mg Oral Daily   Chlorhexidine Gluconate Cloth  6 each Topical Daily   citalopram  40 mg Oral Daily   clopidogrel  75 mg Oral Q0600   cyclobenzaprine  10 mg Oral TID   docusate sodium  100 mg Oral Daily   feeding supplement  237 mL Oral BID BM   fluticasone  2 spray Each Nare Daily   fluticasone furoate-vilanterol  1 puff Inhalation Daily   And   umeclidinium bromide  1 puff Inhalation Daily   gabapentin  600 mg Oral Q6H   heparin  5,000 Units Subcutaneous Q8H   insulin aspart  0-5 Units Subcutaneous QHS   insulin aspart  0-9 Units Subcutaneous TID WC   midodrine  5 mg Oral TID WC   nicotine  21 mg Transdermal Daily   pantoprazole  40 mg Oral Daily   polyethylene glycol  17 g Oral BID   QUEtiapine  100 mg Oral QHS   senna-docusate  2 tablet Oral BID   Vitamin D (Ergocalciferol)  50,000 Units Oral Q7 days   Continuous Infusions:  sodium chloride 100 mL/hr at 04/02/23 1640   sodium chloride     DOPamine     famotidine (PEPCID) IV Stopped (04/03/23 0631)   magnesium sulfate bolus IVPB     nitroGLYCERIN     piperacillin-tazobactam (ZOSYN)  IV Stopped (04/03/23 0631)   vancomycin 1,000 mg (04/03/23 0152)     LOS: 9 days      Charise Killian, MD [facility] Pager [phone]  If 7PM-7AM, please contact night-coverage [email or web address] 04/03/2023, 8:19 AM

## 2023-04-03 NOTE — Evaluation (Signed)
 Physical Therapy Evaluation Patient Details Name: [name] MRN: [id number] DOB: July 27, 1965 Today's Date: 04/03/2023  History of Present Illness  Pt is a 58 y.o. female presenting to hospital 03/25/23 with c/o fever; recent hospitalization [facility] for wound to L great toe (found to have osteomyelitis and started on antibiotics).  Pt admitted with osteomyelitis L foot.  S/p angiogram 3/14; L common femoral artery endarterectomy with patch angioplasty and L LE angiogram with stent placement to L popliteal artery and L proximal SFA 04/01/23; amputation of L hallux through distal proximal phalanx 04/02/23.  PMH includes osteomyelitis L great toe, htn, HLD, DM, COPD, stroke with R sided weakness, depression with anxiety, migraine, kidney stone, diverticulitis, tobacco abuse, ACDF, back sx, B knee joint replacement, R ORIF ankle fx 2019.  Clinical Impression  PT/OT co-evaluation performed.  Prior to recent medical concerns, pt reports using hoyer lift within home for w/c level transfers; husband provides heavy assist for car transfers (to/from w/c); lives with her husband in 1 level home with ramp to enter.  9/10 groin and L foot pain reported during session (nurse notified of pt's pain and request for pain medication).  Currently pt is mod to max assist x2 with bed mobility; initially mod to max assist for sitting balance but improved to CGA to min assist (d/t posterior lean d/t groin pain).  Pt would currently benefit from skilled PT to address noted impairments and functional limitations (see below for any additional details).  Upon hospital discharge, pt would benefit from ongoing therapy.     If plan is discharge home, recommend the following: Two people to help with walking and/or transfers;Two people to help with bathing/dressing/bathroom;Assistance with cooking/housework;Assist for transportation;Help with stairs or ramp for entrance   Can travel by private vehicle        Equipment  Recommendations Other (comment) (pt has hoyer lift and electric w/c at home already)  Recommendations for Other Services       Functional Status Assessment Patient has had a recent decline in their functional status and demonstrates the ability to make significant improvements in function in a reasonable and predictable amount of time.     Precautions / Restrictions Precautions Precautions: Fall Recall of Precautions/Restrictions: Intact Precaution/Restrictions Comments: post op shoe Restrictions Weight Bearing Restrictions Per Provider Order: Yes LLE Weight Bearing Per Provider Order: Weight bearing as tolerated Other Position/Activity Restrictions: WBAT in post-op shoe      Mobility  Bed Mobility Overal bed mobility: Needs Assistance Bed Mobility: Supine to Sit, Sit to Supine     Supine to sit: Mod assist, +2 for physical assistance, HOB elevated, Used rails Sit to supine: Max assist, +2 for physical assistance   General bed mobility comments: assist for trunk and B LE's; vc's for technique    Transfers                   General transfer comment: pt unable to maintain sitting balance to attempt (limited d/t leaning backwards and groin discomfort/pain)    Ambulation/Gait                  Stairs            Wheelchair Mobility     Tilt Bed    Modified Rankin (Stroke Patients Only)       Balance Overall balance assessment: Needs assistance Sitting-balance support: Feet supported, Bilateral upper extremity supported Sitting balance-Leahy Scale: Poor Sitting balance - Comments: Initial mod to max assist to prevent  posterior LOB; improved to CGA to min assist for sitting balance (pt tending to lean backwards) Postural control: Posterior lean                                   Pertinent Vitals/Pain Pain Assessment Pain Assessment: 0-10 Pain Score: 9  Pain Location: L foot and bilat groin incisions Pain Descriptors /  Indicators: Aching, Grimacing, Guarding, Sharp Pain Intervention(s): Limited activity within patient's tolerance, Monitored during session, Repositioned, Patient requesting pain meds-RN notified    Home Living Family/patient expects to be discharged to:: Private residence Living Arrangements: Spouse/significant other Available Help at Discharge: Family;Available 24 hours/day Type of Home: Mobile home Home Access: Ramped entrance       Home Layout: One level Home Equipment: Wheelchair - power;Shower seat;Other (comment);Wheelchair - manual;Hospital bed Additional Comments: Nurse, adult    Prior Function Prior Level of Function : Needs assist             Mobility Comments: Pt reports spouse assists with all transfers: hoyer lift in the home and spouse will give heavy assist for transfers from wheelchair to/from car/etc. ADLs Comments: Husband assists with ADL and all IADL     Extremity/Trunk Assessment   Upper Extremity Assessment Upper Extremity Assessment: Generalized weakness    Lower Extremity Assessment Lower Extremity Assessment:  (h/o stroke with R sided weakness (at least 3/5 AROM hip flexion, knee flexion/extension, and DF); at least 3/5 L LE with AROM (hip flexion, knee flexion/extension))       Communication   Communication Communication: No apparent difficulties    Cognition Arousal: Alert Behavior During Therapy: WFL for tasks assessed/performed   PT - Cognitive impairments: No apparent impairments                         Following commands: Intact       Cueing Cueing Techniques: Verbal cues     General Comments General comments (skin integrity, edema, etc.): HR up to 120's bpm with activity (102 bpm at rest beginning of session)    Exercises     Assessment/Plan    PT Assessment Patient needs continued PT services  PT Problem List Decreased strength;Decreased activity tolerance;Decreased balance;Decreased mobility;Decreased skin  integrity;Pain       PT Treatment Interventions Functional mobility training;Therapeutic activities;Therapeutic exercise;Balance training;Patient/family education    PT Goals (Current goals can be found in the Care Plan section)  Acute Rehab PT Goals Patient Stated Goal: to improve pain and functional mobility PT Goal Formulation: With patient Time For Goal Achievement: 04/17/23 Potential to Achieve Goals: Good    Frequency Min 1X/week     Co-evaluation PT/OT/SLP Co-Evaluation/Treatment: Yes Reason for Co-Treatment: For patient/therapist safety;To address functional/ADL transfers PT goals addressed during session: Mobility/safety with mobility;Balance OT goals addressed during session: ADL's and self-care       AM-PAC PT "6 Clicks" Mobility  Outcome Measure Help needed turning from your back to your side while in a flat bed without using bedrails?: A Lot Help needed moving from lying on your back to sitting on the side of a flat bed without using bedrails?: Total Help needed moving to and from a bed to a chair (including a wheelchair)?: Total Help needed standing up from a chair using your arms (e.g., wheelchair or bedside chair)?: Total Help needed to walk in hospital room?: Total Help needed climbing 3-5 steps with a railing? :  Total 6 Click Score: 7    End of Session   Activity Tolerance: Patient limited by pain Patient left: in bed;with call bell/phone within reach;with bed alarm set;with nursing/sitter in room;with SCD's reapplied;Other (comment) (SCD to R LE; L LE elevated via pillows) Nurse Communication: Mobility status;Precautions;Weight bearing status;Patient requests pain meds;Need for lift equipment PT Visit Diagnosis: Other abnormalities of gait and mobility (R26.89);Muscle weakness (generalized) (M62.81);Pain Pain - Right/Left: Left Pain - part of body:  (groin and foot)    Time: 4401-0272 PT Time Calculation (min) (ACUTE ONLY): 24 min   Charges:   PT  Evaluation $PT Eval Low Complexity: 1 Low   PT General Charges $$ ACUTE PT VISIT: 1 Visit        Hendricks Limes, PT 04/03/23, 5:48 PM

## 2023-04-03 NOTE — Evaluation (Signed)
 Occupational Therapy Evaluation Patient Details Name: [name] MRN: [id number] DOB: [birth year] Today's Date: 04/03/2023   History of Present Illness   58 y/o female presented to ED on 03/25/23 for L foot pain and MD recommendation for IV antibioitics for infected L great toe. MRI showed L great toe osteomyelitis. S/p left common femoral artery endarterectomy with patch angioplasty and left lower extremity angiogram with stent placement to the left popliteal artery and left proximal SFA on 03/27/23. S/p left femoral endarterectomy & stents placed on 04/01/23 as per vasc surg and s/p amputation of left hallux through distal proximal phalanx 04/02/23 as per podiatry PMH: T2DM, HTN, PAD, hx of CVA, COPD.     Clinical Impressions Pt was seen for OT/PT evaluation/co-tx this date. Prior to hospital admission, pt was living with spouse in a ramped entrance mobile home and requiring assist for all transfers. Pt reports spouse uses the hoyer lift for in-home transfers to electric w/c and when transferring from chair<>car, spouse will physically lift her. Spouse assists with all IADL and most ADL. Pt notes she can feed herself and complete grooming tasks without assist. Pt presents to acute OT demonstrating impaired ADL performance and functional mobility 2/2 significant L foot and L>R bilat groin incision pain, decreased strength, balance, activity tolerance, and ROM (hx CVA with R residual deficits) (See OT problem list for additional functional deficits). Pt currently requires MOD-MAX A x2 for bed mobility, MAX A for bed level pericare, and unable to attempt scooting EOB or standing 2/2 pain. RN notified. Pt would benefit from skilled OT services to address noted impairments and functional limitations (see below for any additional details) in order to maximize safety and independence while minimizing falls risk and caregiver burden.    If plan is discharge home, recommend the following:   A lot of help with  walking and/or transfers;A lot of help with bathing/dressing/bathroom;Assistance with cooking/housework;Assist for transportation     Functional Status Assessment   Patient has had a recent decline in their functional status and demonstrates the ability to make significant improvements in function in a reasonable and predictable amount of time.     Equipment Recommendations   None recommended by OT     Recommendations for Other Services         Precautions/Restrictions   Precautions Precautions: Fall;Other (comment) Recall of Precautions/Restrictions: Intact Precaution/Restrictions Comments: post op shoe Restrictions Weight Bearing Restrictions Per Provider Order: Yes LLE Weight Bearing Per Provider Order: Weight bearing as tolerated Other Position/Activity Restrictions: WBAT in post-op shoe     Mobility Bed Mobility Overal bed mobility: Needs Assistance Bed Mobility: Supine to Sit, Sit to Supine     Supine to sit: Mod assist, +2 for physical assistance, HOB elevated, Used rails Sit to supine: Max assist, +2 for physical assistance   General bed mobility comments: VC for anterior lean to prevent psoterior LOB    Transfers                          Balance Overall balance assessment: Needs assistance Sitting-balance support: Feet supported, Bilateral upper extremity supported Sitting balance-Leahy Scale: Poor Sitting balance - Comments: required initial MOD-MAX A to prevent posterior LOB, improving to varying CGA-MIN A to maintain Postural control: Posterior lean                                 ADL either performed  or assessed with clinical judgement   ADL Overall ADL's : Needs assistance/impaired                         Toilet Transfer: Moderate assistance;Maximal assistance Toilet Transfer Details (indicate cue type and reason): rolling to the side Toileting- Clothing Manipulation and Hygiene: Maximal assistance;Bed  level Toileting - Clothing Manipulation Details (indicate cue type and reason): sidelying after being on bed pan             Vision         Perception         Praxis         Pertinent Vitals/Pain Pain Assessment Pain Assessment: 0-10 Pain Score: 9  Pain Location: L foot and bilat groin incisions Pain Descriptors / Indicators: Aching, Grimacing, Guarding, Sharp Pain Intervention(s): Limited activity within patient's tolerance, Monitored during session, Repositioned, Patient requesting pain meds-RN notified     Extremity/Trunk Assessment Upper Extremity Assessment Upper Extremity Assessment: Generalized weakness   Lower Extremity Assessment Lower Extremity Assessment: Defer to PT evaluation       Communication Communication Communication: No apparent difficulties   Cognition Arousal: Alert Behavior During Therapy: WFL for tasks assessed/performed Cognition: No apparent impairments                               Following commands: Intact       Cueing  General Comments   Cueing Techniques: Verbal cues  HR up to 120's with bed mobility   Exercises     Shoulder Instructions      Home Living Family/patient expects to be discharged to:: Private residence Living Arrangements: Spouse/significant other Available Help at Discharge: Family;Available 24 hours/day Type of Home: Mobile home Home Access: Ramped entrance     Home Layout: One level     Bathroom Shower/Tub: Chief Strategy Officer: Standard     Home Equipment: Wheelchair - power;Shower seat;Other (comment)   Additional Comments: hoyer lift      Prior Functioning/Environment Prior Level of Function : Needs assist             Mobility Comments: Pt reports spouse assists with all transfers, hoyer lift in the home and spouse will give heavy assist for transfers from chair to/from car/etc ADLs Comments: Husband assists with ADL and all IADL    OT Problem List:  Decreased strength;Decreased range of motion;Pain;Decreased activity tolerance;Impaired balance (sitting and/or standing);Decreased knowledge of use of DME or AE;Obesity   OT Treatment/Interventions: Self-care/ADL training;Therapeutic exercise;Therapeutic activities;DME and/or AE instruction;Patient/family education;Balance training      OT Goals(Current goals can be found in the care plan section)   Acute Rehab OT Goals Patient Stated Goal: go home and have less pain OT Goal Formulation: With patient Time For Goal Achievement: 04/17/23 Potential to Achieve Goals: Good ADL Goals Pt Will Perform Grooming: sitting;with supervision;with set-up Pt Will Perform Upper Body Dressing: sitting;with supervision;with set-up   OT Frequency:  Min 1X/week    Co-evaluation PT/OT/SLP Co-Evaluation/Treatment: Yes Reason for Co-Treatment: For patient/therapist safety;To address functional/ADL transfers PT goals addressed during session: Mobility/safety with mobility;Balance OT goals addressed during session: ADL's and self-care      AM-PAC OT "6 Clicks" Daily Activity     Outcome Measure Help from another person eating meals?: None Help from another person taking care of personal grooming?: A Little Help from another person toileting, which includes using toliet,  bedpan, or urinal?: A Lot Help from another person bathing (including washing, rinsing, drying)?: A Lot Help from another person to put on and taking off regular upper body clothing?: A Little Help from another person to put on and taking off regular lower body clothing?: A Lot 6 Click Score: 16   End of Session Nurse Communication: Other (comment);Patient requests pain meds (L wrist/hand IV bleeding)  Activity Tolerance: Patient limited by pain Patient left: in bed;with call bell/phone within reach;with bed alarm set;with nursing/sitter in room  OT Visit Diagnosis: Other abnormalities of gait and mobility (R26.89);Muscle weakness  (generalized) (M62.81);Pain Pain - Right/Left: Left Pain - part of body: Leg;Ankle and joints of foot                Time: 1511-1536 OT Time Calculation (min): 25 min Charges:  OT General Charges $OT Visit: 1 Visit OT Evaluation $OT Eval Moderate Complexity: 1 Mod  Arman Filter., MPH, MS, OTR/L ascom [phone] 04/03/23, 4:33 PM

## 2023-04-03 NOTE — Progress Notes (Signed)
  Subjective:  Patient ID: [name], female    DOB: 1965-09-12,  MRN: [id number]   DOS: 04/02/2023 Procedure: Amputation of left hallux or distal proximal phalanx  58 y.o. female seen for post op check.  Patient reports she was having issues in her groin with the wound being open.  She does report a lot of pain in her groin as well as her foot.  Asking about pain medications.  Aware of findings from surgery yesterday as well as plan for follow-up next week  Review of Systems: Negative except as noted in the HPI. Denies N/V/F/Ch.   Objective:   Vitals:   04/03/23 0437 04/03/23 0832  BP: 108/65 110/63  Pulse: 87 91  Resp: 16 18  Temp: 98 F (36.7 C) 98.3 F (36.8 C)  SpO2: 96% 98%   Body mass index is 35.76 kg/m. Constitutional Well developed. Well nourished.  Vascular Foot warm and well perfused. Capillary refill normal to all digits.   No calf pain with palpation  Neurologic Normal speech. Oriented to person, place, and time. Epicritic sensation diminished to the left forefoot  Dermatologic Dressing clean dry and intact  Orthopedic: Status post left hallux amputation through proximal phalanx level   Radiographs: Interval amputation of the great toe involving the distal phalanx and the distal half of the proximal phalanx.  Pathology:  1. Toe(s), amputation, left great :       - CHRONIC ULCERATION WITH UNDERLYING ACUTE OSTEOMYELITIS.       - SEPARATE FRAGMENT OF BONE WITH CUT SURFACE OF BONE AT PROXIMAL MARGIN;       NEGATIVE FOR OSTEOMYELITIS.    Micro: None  Assessment:   1. Sepsis without acute organ dysfunction, due to unspecified organism (HCC)   2. Acute osteomyelitis of toe of left foot (HCC)    Plan:  Patient was evaluated and treated and all questions answered.  POD # 1 s/p left hallux partial amputation through mid proximal phalanx level -Progressing as expected postoperatively -XR: Expected postoperative changes -WB Status: Weightbearing as  tolerated in postop shoe -Sutures: To remain intact 2 to 3 weeks. -Medications/ABX: Recommend 7 days of cephalexin 500 mg 3 times daily upon discharge -Dressing to remain clean dry and intact until follow-up next week -Follow-up in [place] office next week Wednesday or Thursday will have office call to arrange -Will sign off at this time patient stable for discharge from podiatric standpoint        [name], [name] [facility] / [facility]

## 2023-04-03 NOTE — TOC Initial Note (Signed)
 Transition of Care [facility]) - Initial/Assessment Note    Patient Details  Name: [name] MRN: [id number] Date of Birth: 12-06-1965  Transition [facility]) CM/SW Contact:    Cherre Blanc, RN Phone Number: 04/03/2023, 9:34 AM  Clinical Narrative:                 Patient is from home with her husband. Per the patient she is bed bound due to a R ankle injury and stroke w/ R side residual. She has a motorized chair that she uses for ambulation. Her  husband assists with transfers. PT recommends SNF for inpatient rehab, the patient states that she is at her baseline and would like to return to home w/ [facility] PT/OT. The patients husband drives and will be available to transport the patient home at discharge. TOC will continue to follow for dc planning.        Patient Goals and CMS Choice            Expected Discharge Plan and Services                                              Prior Living Arrangements/Services                       Activities of Daily Living   ADL Screening (condition at time of admission) Independently performs ADLs?: Yes (appropriate for developmental age) Is the patient deaf or have difficulty hearing?: Yes (patient states she sometimes has difficulty hearing) Does the patient have difficulty seeing, even when wearing glasses/contacts?: No Does the patient have difficulty concentrating, remembering, or making decisions?: Yes (patient states she has problems remembering)  Permission Sought/Granted                  Emotional Assessment              Admission diagnosis:  Osteomyelitis of left foot (HCC) [M86.9] Acute osteomyelitis of toe of left foot (HCC) [M86.172] Sepsis without acute organ dysfunction, due to unspecified organism [facility]) [A41.9] Patient Active Problem List   Diagnosis Date Noted   Acute osteomyelitis of toe of left foot (HCC) 03/26/2023   Osteomyelitis of left foot (HCC) 03/25/2023   Type 2 diabetes  mellitus with peripheral vascular disease (HCC) 03/25/2023   PVD (peripheral vascular disease) (HCC) 03/25/2023   HTN (hypertension) 03/25/2023   Chronic right hip pain 05/06/2022   Chronic low back pain without sciatica 01/14/2022   Generalized weakness 12/18/2021   Hypokalemia 12/10/2021   High anion gap metabolic acidosis 12/09/2021   Obesity (BMI 30-39.9) 12/09/2021   Hemiparesis of right dominant side as late effect of cerebrovascular disease (HCC) 09/21/2020   Vitamin D deficiency 08/22/2020   Urinary incontinence 07/27/2020   Centrilobular emphysema (HCC) 07/10/2020   Moderate episode of recurrent major depressive disorder (HCC) 07/10/2020   Chronic pain syndrome 07/10/2020   Intractable chronic migraine without aura and with status migrainosus 07/10/2020   Chronic hypoxemic respiratory failure (HCC) 09/23/2017   Pulmonary nodules 09/23/2017   Trimalleolar fracture 08/09/2017   TIA (transient ischemic attack) 04/07/2017   Anxiety and depression 04/07/2017   History of cerebrovascular accident (CVA) with residual deficit    Complicated migraine    Brain TIA 10/11/2012   Hyperlipidemia 10/11/2012   Nocturnal hypoxemia 10/11/2012   Type 2 diabetes mellitus with  other specified complication (HCC) 10/11/2012   Tobacco abuse 10/11/2012   COPD (chronic obstructive pulmonary disease) (HCC) 10/11/2012   PCP:  Smitty Cords, DO Pharmacy:   [facility] - [place], [place] - [phone] [address] [place] [address] Phone: [phone] Fax: [phone]  [facility] - [place], [place] - [address] [place] [address] Phone: [phone] Fax: [phone]     Social Drivers of Health (SDOH) Social History: SDOH Screenings   Food Insecurity: No Food Insecurity (03/26/2023)  Housing: Low Risk  (04/01/2023)  Transportation Needs: No Transportation Needs (03/26/2023)  Utilities: Not At Risk (03/26/2023)   Alcohol Screen: Low Risk  (05/16/2022)  Depression (PHQ2-9): Low Risk  (05/16/2022)  Recent Concern: Depression (PHQ2-9) - High Risk (05/06/2022)  Financial Resource Strain: Low Risk  (11/19/2022)  Physical Activity: Inactive (11/19/2022)  Social Connections: Moderately Isolated (03/26/2023)  Stress: Stress Concern Present (11/19/2022)  Tobacco Use: High Risk (04/02/2023)   SDOH Interventions:     Readmission Risk Interventions     No data to display

## 2023-04-03 NOTE — Plan of Care (Signed)
  Problem: Education: Goal: Ability to describe self-care measures that may prevent or decrease complications (Diabetes Survival Skills Education) will improve Outcome: Progressing   Problem: Coping: Goal: Ability to adjust to condition or change in health will improve Outcome: Progressing   Problem: Health Behavior/Discharge Planning: Goal: Ability to identify and utilize available resources and services will improve Outcome: Progressing   Problem: Skin Integrity: Goal: Risk for impaired skin integrity will decrease Outcome: Progressing   

## 2023-04-04 DIAGNOSIS — M86172 Other acute osteomyelitis, left ankle and foot: Secondary | ICD-10-CM | POA: Diagnosis not present

## 2023-04-04 LAB — BASIC METABOLIC PANEL
Anion gap: 4 — ABNORMAL LOW (ref 5–15)
BUN: 9 mg/dL (ref 6–20)
CO2: 25 mmol/L (ref 22–32)
Calcium: 8.6 mg/dL — ABNORMAL LOW (ref 8.9–10.3)
Chloride: 105 mmol/L (ref 98–111)
Creatinine, Ser: 0.61 mg/dL (ref 0.44–1.00)
GFR, Estimated: >60 mL/min (ref >60)
Glucose, Bld: 149 mg/dL — ABNORMAL HIGH (ref 70–99)
Potassium: 3.9 mmol/L (ref 3.5–5.1)
Sodium: 134 mmol/L — ABNORMAL LOW (ref 135–145)

## 2023-04-04 LAB — GLUCOSE, CAPILLARY
Glucose-Capillary: 131 mg/dL — ABNORMAL HIGH (ref 70–99)
Glucose-Capillary: 127 mg/dL — ABNORMAL HIGH (ref 70–99)

## 2023-04-04 MED ORDER — CEPHALEXIN 500 MG PO CAPS: 500.0000 mg | ORAL_CAPSULE | Freq: Three times a day (TID) | ORAL | 0 refills | Status: AC

## 2023-04-04 MED ORDER — ASPIRIN 81 MG PO TBEC: 81.0000 mg | DELAYED_RELEASE_TABLET | Freq: Every day | ORAL | 0 refills | Status: AC

## 2023-04-04 NOTE — Plan of Care (Signed)
   Problem: Education: Goal: Ability to describe self-care measures that may prevent or decrease complications (Diabetes Survival Skills Education) will improve Outcome: Progressing   Problem: Coping: Goal: Ability to adjust to condition or change in health will improve Outcome: Progressing   Problem: Nutritional: Goal: Maintenance of adequate nutrition will improve Outcome: Progressing   Problem: Health Behavior/Discharge Planning: Goal: Ability to manage health-related needs will improve Outcome: Progressing

## 2023-04-04 NOTE — TOC Transition Note (Signed)
 Transition of Care [facility]) - Discharge Note   Patient Details  Name: [name] MRN: [id number] Date of Birth: Jan 24, 1965  Transition of [facility]) CM/SW Contact:  Bing Quarry, RN Phone Number: 04/04/2023, 1:23 PM   Clinical Narrative:  3/25: Patient declines SNF and wants to go home with home health. Was last active with [facility] in 11/02/23 and Heather at [facility] accepted patient with start of service Monday 04/06/23. This was acceptable with patient as well. Spouse will transport to home on discharge.    Gabriel Cirri MSN RN CM  RN Case Manager [place]  Transitions of Care Direct Dial: [phone] (Weekends Only) [facility] Main Office Phone: [phone] [facility] Fax: [phone] [email or web address]      Final next level of care: Home w Home Health Services Barriers to Discharge: Barriers Resolved   Patient Goals and CMS Choice            Discharge Placement                       Discharge Plan and Services Additional resources added to the After Visit Summary for     Discharge Planning Services: CM Consult            DME Arranged:  (Mototrized Chair)         HH Arranged: PT, OT HH Agency: [facility] ([facility]) Date HH Agency Contacted: 04/04/23 Time HH Agency Contacted: [phone] Representative spoke with at [facility] Agency: [facility], was active with [facility] last 11/02/23.  Social Drivers of Health (SDOH) Interventions SDOH Screenings   Food Insecurity: No Food Insecurity (03/26/2023)  Housing: Low Risk  (04/01/2023)  Transportation Needs: No Transportation Needs (03/26/2023)  Utilities: Not At Risk (03/26/2023)  Alcohol Screen: Low Risk  (05/16/2022)  Depression (PHQ2-9): Low Risk  (05/16/2022)  Recent Concern: Depression (PHQ2-9) - High Risk (05/06/2022)  Financial Resource Strain: Low Risk  (11/19/2022)  Physical Activity: Inactive (11/19/2022)  Social Connections: Moderately Isolated (03/26/2023)  Stress: Stress Concern Present (11/19/2022)  Tobacco  Use: High Risk (04/02/2023)     Readmission Risk Interventions     No data to display

## 2023-04-04 NOTE — Discharge Summary (Signed)
 Physician Discharge Summary  [name] ION:[id number] DOB: [birth year] DOA: 03/25/2023  PCP: Smitty Cords, DO  Admit date: 03/25/2023 Discharge date: 04/04/2023  Admitted From: home Disposition:  home   Recommendations for Outpatient Follow-up:  Follow up with PCP in 1-2 weeks F/u w/ vasc surg, NP Manson Passey, in 3 days F/u w/ podiatry, Dr. Annamary Rummage, in 4-5 days   Home Health: yes Equipment/Devices:  Discharge Condition: stable  CODE STATUS: full  Diet recommendation: Heart Healthy / Carb Modified   Brief/Interim Summary: HPI was taken from Dr. Clyde Lundborg: [name] is a 58 y.o. female with medical history significant of osteomyelitis of left great toe, HTN, HLD, DM, COPD, P:VD, stroke with right-sided weakness,, depression with anxiety, migraine, kidney stone, diverticulitis, tobacco abuse, who presents with left foot pain.   Patient was recently hospitalized to [facility] due to left great toe osteomyelitis. She was discharged home on oral antibiotics of doxycycline and Cipro, with plan for outpatient follow-up with vascular surgery. She has finished a course of antibiotics today, however she has worsening left great toe pain, which is constant, sharp, severe, nonradiating.  Left great toe is erythematous and swelling.  She also developed chills and fever of 100.6 today.  No nausea, vomiting, diarrhea or abdominal pain.  Patient is constipated.  No cough, SOB and chest pain. No symptoms of UTI. It is unable to palpate DP pulses bilaterally,  but was able to obtain biphasic Doppler signals per EDP, Dr, Larinda Buttery.   MRI-left lower extremity on 03/12/23: Distal great toe soft tissue ulcer with active osteomyelitis of the great toe distal phalanx. Abnormal signal involves the entire distal phalanx.  No evidence for soft tissue abscess.    Data reviewed independently and ED Course: pt was found to have WBC 15.2, lactic acid 2.0, GFR> 60, INR 1.1, PTT 28.  Temperature 98.2, blood  pressure 117/76, heart rate 112, 96, RR 18, oxygen saturation 100% on room air.  Patient is admitted to MedSurg bed as inpatient. Consulted Dr. Gilda Crease of [facility]VS and Dr. Annamary Rummage of podiatry.   X-ray of left great toe: Ulcer or wound at the tip of the great toe with osseous destructive change involving tuft of first distal phalanx consistent with osteomyelitis.    Discharge Diagnoses:  Principal Problem:   Osteomyelitis of left foot (HCC) Active Problems:   PVD (peripheral vascular disease) (HCC)   Type 2 diabetes mellitus with peripheral vascular disease (HCC)   History of cerebrovascular accident (CVA) with residual deficit   HTN (hypertension)   Hyperlipidemia   COPD (chronic obstructive pulmonary disease) (HCC)   Tobacco abuse   Anxiety and depression   Obesity (BMI 30-39.9)   Acute osteomyelitis of toe of left foot (HCC) Osteomyelitis of left foot: likely secondary to PAD & DM2. Failed outpatient treatment w/ doxy & cipro. Continue on IV zosyn while inpatient. D/c vanco. Will d/c w/ keflex x 6 days as per podiatry. WBAT in post op shoe & dressing to remain C/D/I until f/u as per podiatry   PAD: s/p left femoral endarterectomy & stents placed on 04/01/23 as per vasc surg and s/p amputation of left hallux through distal proximal phalanx 04/02/23 as per podiatry. Continue on aspirin, plavix   HLD: continue on statin   DM2: likely well controlled, HbA1c 5.2 in 04/2022. Continue on SSI w/ accuchecks    Hx of CVA: w/ residual deficit. Continue on aspirin, statin   COPD: w/o exacerbation. Bronchodilators prn     Tobacco abuse: nicotine  patch to prevent w/ drawl. Received smoking cessation counseling x 5 mins    Depression: severity unknown. Continue on home dose of citalopram, bupropion, seroquel    Obesity: Class II. BMI 34.8. Complicates overall care & prognosis   Chronic pain: takes percocet daily at home    Discharge Instructions  Discharge Instructions     Diet Carb  Modified   Complete by: As directed    Discharge instructions   Complete by: As directed    F/u w/ PCP in 1-2 weeks. F/u w/ vasc surg, NP Manson Passey, in 3 weeks,  F/u w/ podiatry, Dr. Annamary Rummage, 4-5 days.   Discharge instructions   Complete by: As directed    F/u w/ vasc surg, NP Manson Passey, in 3 days & not 3 weeks.   Increase activity slowly   Complete by: As directed    No wound care   Complete by: As directed    No wound care   Complete by: As directed       Allergies as of 04/04/2023       Reactions   Tramadol Nausea And Vomiting, Hypertension   Augmentin [amoxicillin-pot Clavulanate] Rash   Tolerated Zosyn 03/26/23        Medication List     STOP taking these medications    levofloxacin 500 MG tablet Commonly known as: LEVAQUIN   oxyCODONE-acetaminophen 10-325 MG tablet Commonly known as: PERCOCET Replaced by: oxyCODONE-acetaminophen 5-325 MG tablet       TAKE these medications    albuterol 108 (90 Base) MCG/ACT inhaler Commonly known as: VENTOLIN HFA INHALE 2 INHALATIONS BY MOUTH  EVERY 4 HOURS AS NEEDED FOR  WHEEZING OR FOR SHORTNESS OF  BREATH   aspirin EC 81 MG tablet Take 1 tablet (81 mg total) by mouth daily at 6 (six) AM. Swallow whole. Start taking on: April 05, 2023   atorvastatin 40 MG tablet Commonly known as: LIPITOR TAKE 1 TABLET BY MOUTH AT  BEDTIME   azelastine 0.1 % nasal spray Commonly known as: ASTELIN Place 2 sprays into both nostrils 2 (two) times daily.   buPROPion 150 MG 24 hr tablet Commonly known as: WELLBUTRIN XL Take 1 tablet (150 mg total) by mouth daily.   cephALEXin 500 MG capsule Commonly known as: KEFLEX Take 1 capsule (500 mg total) by mouth 3 (three) times daily for 6 days.   citalopram 40 MG tablet Commonly known as: CELEXA TAKE 1 TABLET BY MOUTH DAILY   clopidogrel 75 MG tablet Commonly known as: PLAVIX TAKE ONE TABLET BY MOUTH DAILY AT 9AM   cyclobenzaprine 10 MG tablet Commonly known as: FLEXERIL Take 10 mg  by mouth 3 (three) times daily.   fluconazole 150 MG tablet Commonly known as: DIFLUCAN Take one tablet by mouth on Day 1. Repeat dose 2nd tablet on Day 3.   fluticasone 50 MCG/ACT nasal spray Commonly known as: FLONASE Place 2 sprays into both nostrils daily. Use for 4-6 weeks then stop and use seasonally or as needed.   gabapentin 600 MG tablet Commonly known as: NEURONTIN TAKE 1 TABLET BY MOUTH 4 TIMES  DAILY   metFORMIN 1000 MG tablet Commonly known as: GLUCOPHAGE TAKE 1 TABLET BY MOUTH TWICE  DAILY WITH MEALS   midodrine 5 MG tablet Commonly known as: PROAMATINE Take 1 tablet (5 mg total) by mouth 3 (three) times daily with meals.   Mounjaro 10 MG/0.5ML Pen Generic drug: tirzepatide INJECT SUBCUTANEOUSLY 10 MG ONCE WEEKLY   mupirocin ointment 2 % Commonly known as:  BACTROBAN Apply 1 Application topically 2 (two) times daily. For 10 days   nystatin powder Commonly known as: MYCOSTATIN/NYSTOP APPLY 1 APPLICATION TOPICALLY THREE TIMES DAILY AS NEEDED FOR YEAST RASH INTERTRIGO   ondansetron 4 MG disintegrating tablet Commonly known as: ZOFRAN-ODT Take 1 tablet (4 mg total) by mouth every 8 (eight) hours as needed for nausea or vomiting.   oxyCODONE-acetaminophen 5-325 MG tablet Commonly known as: PERCOCET/ROXICET Take 2 tablets by mouth every 6 (six) hours as needed for moderate pain (pain score 4-6). Replaces: oxyCODONE-acetaminophen 10-325 MG tablet   pantoprazole 40 MG tablet Commonly known as: PROTONIX TAKE 1 TABLET BY MOUTH DAILY AT  9 AM   QUEtiapine 100 MG tablet Commonly known as: SEROQUEL TAKE 1 TABLET BY MOUTH DAILY AT  9 PM AT BEDTIME   topiramate 100 MG tablet Commonly known as: TOPAMAX TAKE 1 TABLET BY MOUTH TWICE  DAILY   Trelegy Ellipta 100-62.5-25 MCG/ACT Aepb Generic drug: Fluticasone-Umeclidin-Vilant USE 1 INHALATION BY MOUTH ONCE  DAILY AT THE SAME TIME EACH DAY   trimethoprim-polymyxin b ophthalmic solution Commonly known as:  Polytrim Place 1 drop into both eyes 4 (four) times daily. For 7-10 days   Vitamin D (Ergocalciferol) 1.25 MG (50000 UNIT) Caps capsule Commonly known as: DRISDOL TAKE 1 CAPSULE BY MOUTH EVERY WEDNESDAY @9AM    VITAMIN D PO Take by mouth.        Follow-up Information     Smitty Cords, DO Follow up in 3 day(s).   Specialty: Family Medicine Why: Hospital follow up and for pain medication refill per pain contract. Contact information: [address] [place] [address] [phone]         Georgiana Spinner, NP Follow up in 3 day(s).   Specialty: Vascular Surgery Why: 3 weeks for Staple removal and wound check. Contact information: [address] [place] [address] [phone]         Tama Headings A, PA-C Follow up in 5 day(s).   Specialty: Physician Assistant Why: Needs Pain medication refilled per contract. Contact information: [address] [place] [address] [phone]                Allergies  Allergen Reactions   Tramadol Nausea And Vomiting and Hypertension   Augmentin [Amoxicillin-Pot Clavulanate] Rash    Tolerated Zosyn 03/26/23    Consultations: Vasc surg Podiatry    Procedures/Studies: DG Foot 2 Views Left Result Date: 04/02/2023 CLINICAL DATA:  Status post amputation EXAM: LEFT FOOT - 2 VIEW COMPARISON:  Preop x-ray 03/25/2023 of the toes FINDINGS: Interval amputation of the distal phalanx of the great toe in the distal half of the proximal phalanx of the great toe. Adjacent soft tissue thickening. Overlapping bandage. Osteopenia. Vascular calcifications. No fracture or dislocation otherwise. Imaging was obtained to aid in treatment. IMPRESSION: Interval amputation of the great toe involving the distal phalanx and the distal half of the proximal phalanx. Electronically Signed   By: Karen Kays M.D.   On: 04/02/2023 14:48   DG C-Arm 1-60 Min-No Report Result Date: 04/01/2023 Fluoroscopy was utilized by the  requesting physician.  No radiographic interpretation.   DG C-Arm 1-60 Min-No Report Result Date: 04/01/2023 Fluoroscopy was utilized by the requesting physician.  No radiographic interpretation.   [place] EKG SITE RITE Result Date: 03/30/2023 If Site Rite image not attached, placement could not be confirmed due to current cardiac rhythm.  ECHOCARDIOGRAM COMPLETE Result Date: 03/29/2023    ECHOCARDIOGRAM REPORT  Patient Name:   KENOSHA DOSTER Date of Exam: 03/29/2023 Medical Rec #:  [id number]      Height:       65.0 in Accession #:    [id number]     Weight:       209.5 lb Date of Birth:  January 24, 1965       BSA:          2.018 m Patient Age:    57 years       BP:           93/66 mmHg Patient Gender: F              HR:           81 bpm. Exam Location:  [facility] Procedure: 2D Echo, Cardiac Doppler, Color Doppler and Intracardiac            Opacification Agent (Both Spectral and Color Flow Doppler were            utilized during procedure). Indications:     Preoperative evaluation  History:         Patient has prior history of Echocardiogram examinations.                  Stroke; Risk Factors:Hypertension.  Sonographer:     Neysa Bonito Roar Referring Phys:  [id number] [facility] CUSTOVIC Diagnosing Phys: Rozell Searing Custovic IMPRESSIONS  1. Left ventricular ejection fraction, by estimation, is 55 to 60%. The left ventricle has normal function. The left ventricle has no regional wall motion abnormalities. Left ventricular diastolic parameters were normal.  2. Right ventricular systolic function is normal. The right ventricular size is normal.  3. The mitral valve is normal in structure. Trivial mitral valve regurgitation. No evidence of mitral stenosis.  4. The aortic valve is normal in structure. Aortic valve regurgitation is not visualized. No aortic stenosis is present.  5. The inferior vena cava is normal in size with greater than 50% respiratory variability, suggesting right atrial pressure of 3 mmHg. FINDINGS  Left Ventricle: Left  ventricular ejection fraction, by estimation, is 55 to 60%. The left ventricle has normal function. The left ventricle has no regional wall motion abnormalities. Definity contrast agent was given IV to delineate the left ventricular  endocardial borders. The left ventricular internal cavity size was normal in size. There is no left ventricular hypertrophy. Left ventricular diastolic parameters were normal. Right Ventricle: The right ventricular size is normal. No increase in right ventricular wall thickness. Right ventricular systolic function is normal. Left Atrium: Left atrial size was normal in size. Right Atrium: Right atrial size was normal in size. Pericardium: There is no evidence of pericardial effusion. Mitral Valve: The mitral valve is normal in structure. Trivial mitral valve regurgitation. No evidence of mitral valve stenosis. MV peak gradient, 5.0 mmHg. The mean mitral valve gradient is 2.0 mmHg. Tricuspid Valve: The tricuspid valve is normal in structure. Tricuspid valve regurgitation is trivial. Aortic Valve: The aortic valve is normal in structure. Aortic valve regurgitation is not visualized. No aortic stenosis is present. Aortic valve mean gradient measures 3.0 mmHg. Aortic valve peak gradient measures 5.2 mmHg. Aortic valve area, by VTI measures 2.09 cm. Pulmonic Valve: The pulmonic valve was normal in structure. Pulmonic valve regurgitation is not visualized. Aorta: The aortic root is normal in size and structure. Venous: The inferior vena cava is normal in size with greater than 50% respiratory variability, suggesting right atrial pressure of 3 mmHg. IAS/Shunts: No atrial level shunt detected by  color flow Doppler.  LEFT VENTRICLE PLAX 2D LVIDd:         3.90 cm   Diastology LVIDs:         2.80 cm   LV e' medial:    6.53 cm/s LV PW:         0.90 cm   LV E/e' medial:  12.9 LV IVS:        1.10 cm   LV e' lateral:   10.00 cm/s LVOT diam:     1.70 cm   LV E/e' lateral: 8.4 LV SV:         48 LV SV  Index:   24 LVOT Area:     2.27 cm  RIGHT VENTRICLE RV Basal diam:  3.20 cm RV Mid diam:    2.50 cm RV S prime:     10.90 cm/s TAPSE (M-mode): 2.0 cm LEFT ATRIUM             Index        RIGHT ATRIUM           Index LA diam:        4.00 cm 1.98 cm/m   RA Area:     13.90 cm LA Vol (A2C):   50.0 ml 24.78 ml/m  RA Volume:   33.90 ml  16.80 ml/m LA Vol (A4C):   49.6 ml 24.58 ml/m LA Biplane Vol: 50.2 ml 24.88 ml/m  AORTIC VALVE                    PULMONIC VALVE AV Area (Vmax):    2.02 cm     PV Vmax:        1.00 m/s AV Area (Vmean):   1.74 cm     PV Peak grad:   4.0 mmHg AV Area (VTI):     2.09 cm     RVOT Peak grad: 2 mmHg AV Vmax:           113.50 cm/s AV Vmean:          75.600 cm/s AV VTI:            0.230 m AV Peak Grad:      5.2 mmHg AV Mean Grad:      3.0 mmHg LVOT Vmax:         101.00 cm/s LVOT Vmean:        57.900 cm/s LVOT VTI:          0.212 m LVOT/AV VTI ratio: 0.92  AORTA Ao Root diam: 2.40 cm Ao Asc diam:  2.90 cm MITRAL VALVE MV Area (PHT): 3.83 cm     SHUNTS MV Area VTI:   2.17 cm     Systemic VTI:  0.21 m MV Peak grad:  5.0 mmHg     Systemic Diam: 1.70 cm MV Mean grad:  2.0 mmHg MV Vmax:       1.12 m/s MV Vmean:      62.4 cm/s MV Decel Time: 198 msec MV E velocity: 84.40 cm/s MV A velocity: 109.00 cm/s MV E/A ratio:  0.77 MV A Prime:    11.0 cm/s Designer, multimedia signed by Clotilde Dieter Signature Date/Time: 03/29/2023/11:38:25 AM    Final    PERIPHERAL VASCULAR CATHETERIZATION Result Date: 03/27/2023 See surgical note for result.  DG Toe Great Left Result Date: 03/25/2023 CLINICAL DATA:  Infection EXAM: LEFT GREAT TOE COMPARISON:  None Available. FINDINGS: Ulcer or wound at the tip of the great toe. Osseous destructive  change involving tuft of first distal phalanx. Mild degenerative change at the first MTP joint. No soft tissue emphysema IMPRESSION: Ulcer or wound at the tip of the great toe with osseous destructive change involving tuft of first distal phalanx consistent  with osteomyelitis. Electronically Signed   By: Jasmine Pang M.D.   On: 03/25/2023 20:54   (Echo, Carotid, EGD, Colonoscopy, ERCP)    Subjective: pt c/o pain   Discharge Exam: Vitals:   04/04/23 0722 04/04/23 0921  BP: (!) 92/54 111/86  Pulse: 86 89  Resp: 18 16  Temp: 99.9 F (37.7 C) 98.7 F (37.1 C)  SpO2: 95% 99%   Vitals:   04/03/23 2349 04/04/23 0308 04/04/23 0722 04/04/23 0921  BP: 130/81 (!) 96/56 (!) 92/54 111/86  Pulse: (!) 107 96 86 89  Resp: 18 18 18 16   Temp: 99.6 F (37.6 C) 99.2 F (37.3 C) 99.9 F (37.7 C) 98.7 F (37.1 C)  TempSrc: Oral Oral  Oral  SpO2: 100% 95% 95% 99%  Weight:      Height:        General: Pt is alert, awake, not in acute distress Cardiovascular:  S1/S2 +, no rubs, no gallops Respiratory: CTA bilaterally, no wheezing, no rhonchi Abdominal: Soft, NT, obese, bowel sounds + Extremities: no cyanosis    The results of significant diagnostics from this hospitalization (including imaging, microbiology, ancillary and laboratory) are listed below for reference.     Microbiology: Recent Results (from the past 240 hours)  Culture, blood (routine x 2)     Status: None   Collection Time: 03/25/23  6:21 PM   Specimen: BLOOD RIGHT ARM  Result Value Ref Range Status   Specimen Description BLOOD RIGHT ARM  Final   Special Requests   Final    BOTTLES DRAWN AEROBIC AND ANAEROBIC Blood Culture adequate volume   Culture   Final    NO GROWTH 5 DAYS Performed at [facility], [address] [place], [place] [address]    Report Status 03/30/2023 FINAL  Final  Culture, blood (routine x 2)     Status: None   Collection Time: 03/25/23  7:56 PM   Specimen: BLOOD  Result Value Ref Range Status   Specimen Description BLOOD BLOOD LEFT ARM  Final   Special Requests   Final    BOTTLES DRAWN AEROBIC AND ANAEROBIC Blood Culture results may not be optimal due to an inadequate volume of blood received in culture bottles   Culture   Final     NO GROWTH 5 DAYS Performed at [facility], [address], [place], [place] [address]    Report Status 03/30/2023 FINAL  Final  MRSA Next Gen by PCR, Nasal     Status: None   Collection Time: 04/01/23  6:49 PM   Specimen: Nasal Mucosa; Nasal Swab  Result Value Ref Range Status   MRSA by PCR Next Gen NOT DETECTED NOT DETECTED Final    Comment: (NOTE) The GeneXpert MRSA Assay (FDA approved for NASAL specimens only), is one component of a comprehensive MRSA colonization surveillance program. It is not intended to diagnose MRSA infection nor to guide or monitor treatment for MRSA infections. Test performance is not FDA approved in patients less than 16 years old. Performed at [facility], [address] [place], [place] [address]      Labs: BNP (last 3 results) No results for input(s): "BNP" in the last 8760 hours. Basic Metabolic Panel: Recent Labs  Lab 04/01/23  1923 04/01/23 2336 04/02/23 0406 04/03/23 0455 04/04/23 0608  NA  --  136 135 140 134*  K  --  5.5* 4.7 4.0 3.9  CL  --  107 104 106 105  CO2  --  19* 22 25 25   GLUCOSE  --  262* 203* 158* 149*  BUN  --  12 11 11 9   CREATININE 0.61 0.64 0.59 0.66 0.61  CALCIUM  --  8.7* 8.9 8.7* 8.6*   Liver Function Tests: No results for input(s): "AST", "ALT", "ALKPHOS", "BILITOT", "PROT", "ALBUMIN" in the last 168 hours. No results for input(s): "LIPASE", "AMYLASE" in the last 168 hours. No results for input(s): "AMMONIA" in the last 168 hours. CBC: Recent Labs  Lab 04/01/23 1923 04/01/23 2336 04/02/23 1315  WBC 11.1* 10.7* 14.3*  HGB 12.7 11.8* 11.9*  HCT 38.6 35.2* 37.0  MCV 90.0 87.3 92.7  PLT 279 287 294   Cardiac Enzymes: No results for input(s): "CKTOTAL", "CKMB", "CKMBINDEX", "TROPONINI" in the last 168 hours. BNP: Invalid input(s): "POCBNP" CBG: Recent Labs  Lab 04/03/23 1138 04/03/23 1640 04/03/23 2133 04/04/23 0724 04/04/23 1108  GLUCAP 206* 136* 151* 131* 127*    D-Dimer No results for input(s): "DDIMER" in the last 72 hours. Hgb A1c No results for input(s): "HGBA1C" in the last 72 hours. Lipid Profile No results for input(s): "CHOL", "HDL", "LDLCALC", "TRIG", "CHOLHDL", "LDLDIRECT" in the last 72 hours. Thyroid function studies No results for input(s): "TSH", "T4TOTAL", "T3FREE", "THYROIDAB" in the last 72 hours.  Invalid input(s): "FREET3" Anemia work up No results for input(s): "VITAMINB12", "FOLATE", "FERRITIN", "TIBC", "IRON", "RETICCTPCT" in the last 72 hours. Urinalysis    Component Value Date/Time   COLORURINE YELLOW (A) 12/08/2021 2343   APPEARANCEUR Clear 09/08/2022 1446   LABSPEC >1.046 (H) 12/08/2021 2343   LABSPEC 1.030 09/23/2013 0154   PHURINE 5.0 12/08/2021 2343   GLUCOSEU Negative 09/08/2022 1446   GLUCOSEU >=500 09/23/2013 0154   HGBUR NEGATIVE 12/08/2021 2343   BILIRUBINUR Negative 09/08/2022 1446   BILIRUBINUR Negative 09/23/2013 0154   KETONESUR 80 (A) 12/08/2021 2343   PROTEINUR Negative 09/08/2022 1446   PROTEINUR 30 (A) 12/08/2021 2343   UROBILINOGEN 0.2 04/21/2022 1445   UROBILINOGEN 0.2 03/11/2012 1234   NITRITE Negative 09/08/2022 1446   NITRITE NEGATIVE 12/08/2021 2343   LEUKOCYTESUR Negative 09/08/2022 1446   LEUKOCYTESUR TRACE (A) 12/08/2021 2343   LEUKOCYTESUR Negative 09/23/2013 0154   Sepsis Labs Recent Labs  Lab 04/01/23 1923 04/01/23 2336 04/02/23 1315  WBC 11.1* 10.7* 14.3*   Microbiology Recent Results (from the past 240 hours)  Culture, blood (routine x 2)     Status: None   Collection Time: 03/25/23  6:21 PM   Specimen: BLOOD RIGHT ARM  Result Value Ref Range Status   Specimen Description BLOOD RIGHT ARM  Final   Special Requests   Final    BOTTLES DRAWN AEROBIC AND ANAEROBIC Blood Culture adequate volume   Culture   Final    NO GROWTH 5 DAYS Performed at [facility], [address] [place], [place] [address]    Report Status 03/30/2023 FINAL  Final  Culture,  blood (routine x 2)     Status: None   Collection Time: 03/25/23  7:56 PM   Specimen: BLOOD  Result Value Ref Range Status   Specimen Description BLOOD BLOOD LEFT ARM  Final   Special Requests   Final    BOTTLES DRAWN AEROBIC AND ANAEROBIC Blood Culture results may not be optimal due to  an inadequate volume of blood received in culture bottles   Culture   Final    NO GROWTH 5 DAYS Performed at [facility], [address] [place] [place], [place] [address]    Report Status 03/30/2023 FINAL  Final  MRSA Next Gen by PCR, Nasal     Status: None   Collection Time: 04/01/23  6:49 PM   Specimen: Nasal Mucosa; Nasal Swab  Result Value Ref Range Status   MRSA by PCR Next Gen NOT DETECTED NOT DETECTED Final    Comment: (NOTE) The GeneXpert MRSA Assay (FDA approved for NASAL specimens only), is one component of a comprehensive MRSA colonization surveillance program. It is not intended to diagnose MRSA infection nor to guide or monitor treatment for MRSA infections. Test performance is not FDA approved in patients less than 29 years old. Performed at [facility], [address], [place], [place] [address]      Time coordinating discharge: Over 30 minutes  SIGNED:   Charise Killian, MD  [facility] 04/04/2023, 12:28 PM Pager   If 7PM-7AM, please contact night-coverage [email or web address]

## 2023-04-06 ENCOUNTER — Telehealth: Payer: Self-pay

## 2023-04-06 NOTE — Transitions of Care (Post Inpatient/ED Visit) (Signed)
 04/06/2023  Name: [name] MRN: [id number] DOB: 1965-07-18  Today's TOC FU Call Status: Today's TOC FU Call Status:: Successful TOC FU Call Completed TOC FU Call Complete Date: 04/06/23 Patient's Name and Date of Birth confirmed.  Transition Care Management Follow-up Telephone Call Date of Discharge: 04/04/23 Discharge Facility: [facility]) Type of Discharge: Inpatient Admission Primary Inpatient Discharge Diagnosis:: Osteomyelitis of left foot How have you been since you were released from the hospital?: Better Any questions or concerns?: Yes Patient Questions/Concerns:: Home Health wound care Patient Questions/Concerns Addressed: Notified Provider of Patient Questions/Concerns  Items Reviewed: Did you receive and understand the discharge instructions provided?: Yes Medications obtained,verified, and reconciled?: Yes (Medications Reviewed) (Medication reconciliation completed based on recent discharge summary Patient taking medications as instructed and is aware of any changes or dosage adjustments medication regimen. Patient denies questions and reports no barriers to medication adherence) Any new allergies since your discharge?: No Dietary orders reviewed?: Yes Type of Diet Ordered:: Reg Heart Healthy Carb Modified Do you have support at home?: Yes People in Home: spouse Name of Support/Comfort Primary Source: Husband [name]  Medications Reviewed Today: Medications Reviewed Today     Reviewed by Johnnette Barrios, RN (Registered Nurse) on 04/06/23 at 1502  Med List Status: <None>   Medication Order Taking? Sig Documenting Provider Last Dose Status Informant  albuterol (VENTOLIN HFA) 108 (90 Base) MCG/ACT inhaler [id number] Yes INHALE 2 INHALATIONS BY MOUTH  EVERY 4 HOURS AS NEEDED FOR  WHEEZING OR FOR SHORTNESS OF  BREATH Karamalegos, Netta Neat, DO Taking Active Self           Med Note Clarene Reamer, MARIA   Wed Mar 25, 2023  8:28 PM) prn  aspirin  EC 81 MG tablet [id number] Yes Take 1 tablet (81 mg total) by mouth daily at 6 (six) AM. Swallow whole. Charise Killian, MD Taking Active   atorvastatin (LIPITOR) 40 MG tablet [id number] Yes TAKE 1 TABLET BY MOUTH AT  BEDTIME Smitty Cords, DO Taking Active Self  azelastine (ASTELIN) 0.1 % nasal spray [id number] No Place 2 sprays into both nostrils 2 (two) times daily.  Patient not taking: Reported on 03/25/2023   Smitty Cords, DO Not Taking Active Pharmacy Records, Self  buPROPion (WELLBUTRIN XL) 150 MG 24 hr tablet [id number] Yes Take 1 tablet (150 mg total) by mouth daily. Smitty Cords, DO Taking Active Self  cephALEXin (KEFLEX) 500 MG capsule [id number] Yes Take 1 capsule (500 mg total) by mouth 3 (three) times daily for 6 days. Charise Killian, MD Taking Active   citalopram (CELEXA) 40 MG tablet [id number] Yes TAKE 1 TABLET BY MOUTH DAILY Smitty Cords, DO Taking Active Self  clopidogrel (PLAVIX) 75 MG tablet [id number] Yes TAKE ONE TABLET BY MOUTH DAILY AT [place] Score, Netta Neat, DO Taking Active Self  cyclobenzaprine (FLEXERIL) 10 MG tablet [id number] Yes Take 10 mg by mouth 3 (three) times daily. [provider] Taking Active Self  fluconazole (DIFLUCAN) 150 MG tablet [id number]  Take one tablet by mouth on Day 1. Repeat dose 2nd tablet on Day 3. Smitty Cords, DO  Active Self  fluticasone (FLONASE) 50 MCG/ACT nasal spray [id number] Yes Place 2 sprays into both nostrils daily. Use for 4-6 weeks then stop and use seasonally or as needed. Smitty Cords, DO Taking Active Self  gabapentin (NEURONTIN) 600 MG tablet [id number] Yes TAKE 1 TABLET BY MOUTH 4 TIMES  DAILY Althea Charon Netta Neat, DO Taking Active  Self  metFORMIN (GLUCOPHAGE) 1000 MG tablet [id number]  TAKE 1 TABLET BY MOUTH TWICE  DAILY WITH MEALS  Patient not taking: Reported on 03/02/2023   Smitty Cords, DO  Active Self  midodrine (PROAMATINE) 5  MG tablet [id number] Yes Take 1 tablet (5 mg total) by mouth 3 (three) times daily with meals. Debbe Odea, MD Taking Active Self  MOUNJARO 10 MG/0.5ML Pen [id number] Yes INJECT SUBCUTANEOUSLY 10 MG ONCE WEEKLY Smitty Cords, DO Taking Active Self  mupirocin ointment (BACTROBAN) 2 % [id number] Yes Apply 1 Application topically 2 (two) times daily. For 10 days Smitty Cords, DO Taking Active Self           Med Note Harlow Asa Mar 25, 2023  9:08 PM) Patient uses as needed  nystatin (MYCOSTATIN/NYSTOP) powder [id number] Yes APPLY 1 APPLICATION TOPICALLY THREE TIMES DAILY AS NEEDED FOR YEAST RASH INTERTRIGO Smitty Cords, DO Taking Active Self           Med Note Sharia Reeve   Wed Mar 25, 2023  9:09 PM) prn  ondansetron (ZOFRAN-ODT) 4 MG disintegrating tablet [id number] Yes Take 1 tablet (4 mg total) by mouth every 8 (eight) hours as needed for nausea or vomiting. Smitty Cords, DO Taking Active Self           Med Note Clarene Reamer, MARIA   Wed Mar 25, 2023  8:29 PM) prn  oxyCODONE-acetaminophen (PERCOCET/ROXICET) 5-325 MG tablet [id number] Yes Take 2 tablets by mouth every 6 (six) hours as needed for moderate pain (pain score 4-6). Marcie Bal, NP Taking Active            Med Note Sharon Seller, Lyvonne Cassell L   Mon Apr 06, 2023  2:58 PM) Uses 10 mg/650 Poor control   pantoprazole (PROTONIX) 40 MG tablet [id number] Yes TAKE 1 TABLET BY MOUTH DAILY AT  9 AM Smitty Cords, DO Taking Active Self  QUEtiapine (SEROQUEL) 100 MG tablet [id number] Yes TAKE 1 TABLET BY MOUTH DAILY AT  9 PM AT BEDTIME Smitty Cords, DO Taking Active Self  topiramate (TOPAMAX) 100 MG tablet [id number] Yes TAKE 1 TABLET BY MOUTH TWICE  DAILY Smitty Cords, DO Taking Active Self  TRELEGY ELLIPTA 100-62.5-25 MCG/ACT AEPB [id number] Yes USE 1 INHALATION BY MOUTH ONCE  DAILY AT THE SAME TIME EACH DAY Althea Charon, Netta Neat, DO Taking Active Self   trimethoprim-polymyxin b (POLYTRIM) ophthalmic solution [id number] No Place 1 drop into both eyes 4 (four) times daily. For 7-10 days  Patient not taking: Reported on 04/06/2023   Smitty Cords, DO Not Taking Active Self           Med Note Harlow Asa Mar 25, 2023  9:16 PM) Patient uses as needed  VITAMIN D PO [id number] Yes Take by mouth. [provider] Taking Active Self           Med Note Clarene Reamer, MARIA   Wed Mar 25, 2023  8:30 PM) Every wed.  Vitamin D, Ergocalciferol, (DRISDOL) 1.25 MG (50000 UNIT) CAPS capsule [id number] Yes TAKE 1 CAPSULE BY MOUTH EVERY WEDNESDAY @9AM  Smitty Cords, DO Taking Active Self          Medication updates reviewed with patient  Medication List       STOP taking these medications     levofloxacin 500 MG tablet Commonly known as: LEVAQUIN    oxyCODONE-acetaminophen 10-325 MG tablet Commonly known as: PERCOCET Replaced by:  oxyCODONE-acetaminophen 5-325 MG tablet   CephALEXin 500 MG capsule Commonly known as: KEFLEX Take 1 capsule (500 mg total) by mouth 3 (three) times daily for 6 days.   Home Care and Equipment/Supplies: Were Home Health Services Ordered?: Yes Name of Home Health Agency:: [facility]  [phone] Has Agency set up a time to come to your home?: No EMR reviewed for Home Health Orders: Orders present/patient has not received call (refer to CM for follow-up) (Call placed to [facility] Is a long standing patient) Any new equipment or medical supplies ordered?: No (She needs a bedpan)  Functional Questionnaire: Do you need assistance with bathing/showering or dressing?: Yes Do you need assistance with meal preparation?: Yes Do you need assistance with eating?: No Do you have difficulty maintaining continence: Yes (using pull-ups She would like a bedpan) Do you need assistance with getting out of bed/getting out of a chair/moving?: Yes ([place] ,motorized wheelchair) Do you have  difficulty managing or taking your medications?: No (well versed in her meds)  Follow up appointments reviewed: PCP Follow-up appointment confirmed?: No (she called and is waiting for a call back for date and time) MD Provider Line Number:[phone] Given: No Specialist Hospital Follow-up appointment confirmed?: Yes Date of Specialist follow-up appointment?: 04/20/23 Follow-Up Specialty Provider:: Surgicsl  3/25 Pain management Do you need transportation to your follow-up appointment?: Yes Transportation Need Intervention Addressed By:: Other: (her spouse takes her) Do you understand care options if your condition(s) worsen?: Yes-patient verbalized understanding  SDOH Interventions Today    Flowsheet Row Most Recent Value  SDOH Interventions   Food Insecurity Interventions Intervention Not Indicated  Housing Interventions Intervention Not Indicated  Transportation Interventions Intervention Not Indicated, Patient Resources (Friends/Family), Payor Benefit  Utilities Interventions Intervention Not Indicated      Interventions Today    Flowsheet Row Most Recent Value  Chronic Disease   Chronic disease during today's visit Diabetes, Other  [wound care]  General Interventions   General Interventions Discussed/Reviewed General Interventions Discussed, General Interventions Reviewed, Durable Medical Equipment (DME), Sick Day Rules, Doctor Visits, Referral to Nurse  Doctor Visits Discussed/Reviewed PCP, Specialist, Doctor Visits Discussed, Doctor Visits Reviewed  Durable Medical Equipment (DME) Wheelchair, BP Cuff, Glucomoter  [employer] motorized w/c]  Wheelchair Motorized  PCP/Specialist Visits Contact provider for referral to, Compliance with follow-up visit  Contacted provider for referral to PCP, Specialist  Exercise Interventions   Exercise Discussed/Reviewed Exercise Reviewed, Exercise Discussed, Physical Activity, Assistive device use and maintanence  Physical Activity  Discussed/Reviewed Physical Activity Discussed, Physical Activity Reviewed  Education Interventions   Education Provided Provided Education  Provided Verbal Education On Blood Sugar Monitoring, Medication, Foot Care, Nutrition, Sick Day Rules, When to see the doctor  Nutrition Interventions   Nutrition Discussed/Reviewed Nutrition Discussed, Nutrition Reviewed, Decreasing sugar intake, Carbohydrate meal planning, Decreasing fats, Supplemental nutrition  [Boost]  Pharmacy Interventions   Pharmacy Dicussed/Reviewed Pharmacy Topics Discussed, Pharmacy Topics Reviewed, Medication Adherence, Medications and their functions         Benefits reviewed  Based on current information and Insurance plan -Reviewed benefits accessible to patient, including details about eligibility options for care and  available value based care options  if any areas of needs were identified.  Reviewed patient/  caregiver's ability to access and / or  ability with navigating the benefits system..Amb Referral made if indicted , refer to orders section of note for details   Reviewed goals for care Patient  and / or Caregiveverbalizes understanding of instructions and care plan provided. Patient /  Caregiver was encouraged to make informed decisions about their care, actively participate in managing their health condition, and implement lifestyle changes as needed to promote independence and self-management of health care. There were no reported  barriers to care.   TOC program  Patient is at  risk for readmission and / or has history of  high utilization  Discussed VBCI  TOC program and weekly calls to patient to assess condition/status, medication management  and provide support/education as indicated . Patient  and / or Caregive voiced understanding and declined enrollment in the 30-day TOC Program at this time .   She is doing ok. She reports abdominal and foot pain She does not feel the Percocet is is effective anymore. She  has an appointment with pain management 3/26. She seems Surgeon 4/7 Her dressings are intact but she reports they have bloody drainage . She is bed bound She uses a Product/process development scientist to get OOB she has a motorized a w/c . Call paced to [facility] .verified she has SOC and confirmed SN PT and OT   The Patient  and / or Caregiver  has been provided with contact information for the care management team and has been advised to call with any health-related questions or concerns. Patient was encouraged to Contact PCP with any questions or concerns regarding ongoing medical care, any difficulty obtaining or picking up prescriptions, any changes or worsening in condition including signs / symptoms not relieved  with interventions Patient had no additional questions or concerns at this time.      Susa Loffler , BSN, RN [facility]   VBCI-Population Health RN Care Manager Direct Dial [phone]  Fax: [phone] Website: [email or web address]

## 2023-04-06 NOTE — Consult Note (Signed)
 [facility] Liaison Note  04/06/2023  MICHAELYN WALL 1965-08-26 [id number]  Location: RN Hospital Liaison screened the patient remotely at [facility].  Insurance: [employer]   [name] is a 58 y.o. female who is a Primary Care Patient of Althea Charon, Netta Neat, DO [facility]. The patient was screened for  readmission hospitalization with noted high risk score for unplanned readmission risk with 1 IP in 6 months.  The patient was assessed for potential Care Management service needs for post hospital transition for care coordination. Review of patient's electronic medical record reveals patient was admitted for Osteomyelitis. Pt declined SNF recommendations and discharged on 04/04/23 with [facility] services. No additional anticipated needs at this time.   Plan: [facility] Liaison will continue to follow progress and disposition to asess for post hospital community care coordination/management needs.  Referral request for community care coordination: anticipate Transitions of Care Team follow up.   VBCI Care Management/Population Health does not replace or interfere with any arrangements made by the Inpatient Transition of Care team.   For questions contact:   Elliot Cousin, RN, BSN Hospital Liaison [place]   [facility], Population Health Office Hours MTWF  8:00 am-6:00 pm Direct Dial: [phone] mobile [email or web address]

## 2023-04-08 ENCOUNTER — Ambulatory Visit: Admitting: Family Medicine

## 2023-04-08 ENCOUNTER — Other Ambulatory Visit: Payer: Self-pay | Admitting: Family Medicine

## 2023-04-08 DIAGNOSIS — Z79899 Other long term (current) drug therapy: Secondary | ICD-10-CM | POA: Diagnosis not present

## 2023-04-08 DIAGNOSIS — M25561 Pain in right knee: Secondary | ICD-10-CM | POA: Diagnosis not present

## 2023-04-08 DIAGNOSIS — F17210 Nicotine dependence, cigarettes, uncomplicated: Secondary | ICD-10-CM | POA: Diagnosis not present

## 2023-04-08 DIAGNOSIS — E7800 Pure hypercholesterolemia, unspecified: Secondary | ICD-10-CM | POA: Diagnosis not present

## 2023-04-08 DIAGNOSIS — M25562 Pain in left knee: Secondary | ICD-10-CM | POA: Diagnosis not present

## 2023-04-08 DIAGNOSIS — R110 Nausea: Secondary | ICD-10-CM

## 2023-04-08 DIAGNOSIS — G8929 Other chronic pain: Secondary | ICD-10-CM | POA: Diagnosis not present

## 2023-04-08 DIAGNOSIS — R030 Elevated blood-pressure reading, without diagnosis of hypertension: Secondary | ICD-10-CM | POA: Diagnosis not present

## 2023-04-08 DIAGNOSIS — M5416 Radiculopathy, lumbar region: Secondary | ICD-10-CM | POA: Diagnosis not present

## 2023-04-09 ENCOUNTER — Encounter: Payer: Self-pay | Admitting: Vascular Surgery

## 2023-04-09 ENCOUNTER — Ambulatory Visit (INDEPENDENT_AMBULATORY_CARE_PROVIDER_SITE_OTHER): Admitting: Podiatry

## 2023-04-09 ENCOUNTER — Ambulatory Visit: Payer: 59 | Attending: Medical | Admitting: Medical

## 2023-04-09 DIAGNOSIS — Z89412 Acquired absence of left great toe: Secondary | ICD-10-CM

## 2023-04-09 NOTE — Progress Notes (Deleted)
  Cardiology Office Note:  .   Date:  04/09/2023  ID:  [name], DOB [birth year], MRN [id number] PCP: Smitty Cords, DO  [facility] Providers Cardiologist:  Debbe Odea, MD { Click to update primary MD,subspecialty MD or APP then REFRESH:1}   History of Present Illness: .   [name] is a 58 y.o. female with a past medical history of hyperlipidemia, current smoker x 45+ years, PAD, wheelchair-bound, stroke who presents for 18-month follow-up.  Patient has a history of PAD with significant right SFA/popliteal stenosis and mild to moderate narrowing of the left CFA.  More recently in March 2025, the patient underwent left common femoral endarterectomy with bovine pericardial patch angioplasty, open angioplasty and stent placement to the left SFA, open angioplasty and stent placement to the mid popliteal  Echo in 2019 showed EF of 60 to 65%.  ROS: ***  Studies Reviewed: .        *** Risk Assessment/Calculations:   {Does this patient have ATRIAL FIBRILLATION?:[phone]} No BP recorded.  {Refresh Note OR Click here to enter BP  :1}***       Physical Exam:   VS:  There were no vitals taken for this visit.   Wt Readings from Last 3 Encounters:  04/02/23 221 lb 9 oz (100.5 kg)  11/19/22 230 lb (104.3 kg)  09/16/22 244 lb (110.7 kg)    GEN: Well nourished, well developed in no acute distress NECK: No JVD; No carotid bruits CARDIAC: ***RRR, no murmurs, rubs, gallops RESPIRATORY:  Clear to auscultation without rales, wheezing or rhonchi  ABDOMEN: Soft, non-tender, non-distended EXTREMITIES:  No edema; No deformity   ASSESSMENT AND PLAN: .   ***    {Are you ordering a CV Procedure (e.g. stress test, cath, DCCV, TEE, etc)?   Press F2        :[id number]}  Dispo: ***  Signed, Cella Cappello David Stall, PA-C

## 2023-04-09 NOTE — Progress Notes (Signed)
 Subjective:  Patient ID: [name], female    DOB: Jan 22, 1965,  MRN: [id number]  Chief Complaint  Patient presents with   Routine Post Op    DOS: 04/02/2023 Procedure: Left partial hallux amputation  58 y.o. female returns for post-op check.  Patient states that she is doing well denies any other acute complaints pain is controlled  Review of Systems: Negative except as noted in the HPI. Denies N/V/F/Ch.  Past Medical History:  Diagnosis Date   Anxiety    Cancer (HCC)    a spot on liver and treated    Complication of anesthesia    restless,easily upset   COPD (chronic obstructive pulmonary disease) (HCC)    Depression    Diabetes mellitus without complication (HCC)    Diverticulitis    Fatty liver    GERD (gastroesophageal reflux disease)    Headache(784.0)    migraines   History of kidney stones    Hyperlipidemia    Hypertension    PAD (peripheral artery disease) (HCC)    Pneumonia    Restless    Stroke (HCC)     Current Outpatient Medications:    albuterol (VENTOLIN HFA) 108 (90 Base) MCG/ACT inhaler, INHALE 2 INHALATIONS BY MOUTH  EVERY 4 HOURS AS NEEDED FOR  WHEEZING OR FOR SHORTNESS OF  BREATH, Disp: 51 g, Rfl: 2   aspirin EC 81 MG tablet, Take 1 tablet (81 mg total) by mouth daily at 6 (six) AM. Swallow whole., Disp: 30 tablet, Rfl: 0   atorvastatin (LIPITOR) 40 MG tablet, TAKE 1 TABLET BY MOUTH AT  BEDTIME, Disp: 90 tablet, Rfl: 3   azelastine (ASTELIN) 0.1 % nasal spray, Place 2 sprays into both nostrils 2 (two) times daily. (Patient not taking: Reported on 03/25/2023), Disp: 30 mL, Rfl: 12   buPROPion (WELLBUTRIN XL) 150 MG 24 hr tablet, Take 1 tablet (150 mg total) by mouth daily., Disp: 100 tablet, Rfl: 1   cephALEXin (KEFLEX) 500 MG capsule, Take 1 capsule (500 mg total) by mouth 3 (three) times daily for 6 days., Disp: 18 capsule, Rfl: 0   citalopram (CELEXA) 40 MG tablet, TAKE 1 TABLET BY MOUTH DAILY, Disp: 100 tablet, Rfl: 1   clopidogrel (PLAVIX) 75  MG tablet, TAKE ONE TABLET BY MOUTH DAILY AT 9AM, Disp: 100 tablet, Rfl: 11   cyclobenzaprine (FLEXERIL) 10 MG tablet, Take 10 mg by mouth 3 (three) times daily., Disp: , Rfl:    fluconazole (DIFLUCAN) 150 MG tablet, Take one tablet by mouth on Day 1. Repeat dose 2nd tablet on Day 3., Disp: 2 tablet, Rfl: 0   fluticasone (FLONASE) 50 MCG/ACT nasal spray, Place 2 sprays into both nostrils daily. Use for 4-6 weeks then stop and use seasonally or as needed., Disp: 16 g, Rfl: 0   gabapentin (NEURONTIN) 600 MG tablet, TAKE 1 TABLET BY MOUTH 4 TIMES  DAILY, Disp: 400 tablet, Rfl: 2   metFORMIN (GLUCOPHAGE) 1000 MG tablet, TAKE 1 TABLET BY MOUTH TWICE  DAILY WITH MEALS (Patient not taking: Reported on 03/02/2023), Disp: 200 tablet, Rfl: 2   midodrine (PROAMATINE) 5 MG tablet, Take 1 tablet (5 mg total) by mouth 3 (three) times daily with meals., Disp: 21 tablet, Rfl: 0   MOUNJARO 10 MG/0.5ML Pen, INJECT SUBCUTANEOUSLY 10 MG ONCE WEEKLY, Disp: 6 mL, Rfl: 3   mupirocin ointment (BACTROBAN) 2 %, Apply 1 Application topically 2 (two) times daily. For 10 days, Disp: 22 g, Rfl: 0   nystatin (MYCOSTATIN/NYSTOP) powder, APPLY 1 APPLICATION  TOPICALLY THREE TIMES DAILY AS NEEDED FOR YEAST RASH INTERTRIGO, Disp: 30 g, Rfl: 11   ondansetron (ZOFRAN-ODT) 4 MG disintegrating tablet, Take 1 tablet (4 mg total) by mouth every 8 (eight) hours as needed for nausea or vomiting., Disp: 30 tablet, Rfl: 2   oxyCODONE-acetaminophen (PERCOCET/ROXICET) 5-325 MG tablet, Take 2 tablets by mouth every 6 (six) hours as needed for moderate pain (pain score 4-6)., Disp: 20 tablet, Rfl: 0   pantoprazole (PROTONIX) 40 MG tablet, TAKE 1 TABLET BY MOUTH DAILY AT  9 AM, Disp: 100 tablet, Rfl: 0   QUEtiapine (SEROQUEL) 100 MG tablet, TAKE 1 TABLET BY MOUTH DAILY AT  9 PM AT BEDTIME, Disp: 100 tablet, Rfl: 2   topiramate (TOPAMAX) 100 MG tablet, TAKE 1 TABLET BY MOUTH TWICE  DAILY, Disp: 200 tablet, Rfl: 2   TRELEGY ELLIPTA 100-62.5-25 MCG/ACT  AEPB, USE 1 INHALATION BY MOUTH ONCE  DAILY AT THE SAME TIME EACH DAY, Disp: 180 each, Rfl: 3   trimethoprim-polymyxin b (POLYTRIM) ophthalmic solution, Place 1 drop into both eyes 4 (four) times daily. For 7-10 days (Patient not taking: Reported on 04/06/2023), Disp: 10 mL, Rfl: 0   VITAMIN D PO, Take by mouth., Disp: , Rfl:    Vitamin D, Ergocalciferol, (DRISDOL) 1.25 MG (50000 UNIT) CAPS capsule, TAKE 1 CAPSULE BY MOUTH EVERY WEDNESDAY @9AM , Disp: 4 capsule, Rfl: 11  Social History   Tobacco Use  Smoking Status Every Day   Current packs/day: 1.00   Average packs/day: 1 pack/day for 20.0 years (20.0 ttl pk-yrs)   Types: Cigarettes  Smokeless Tobacco Never    Allergies  Allergen Reactions   Tramadol Nausea And Vomiting and Hypertension   Augmentin [Amoxicillin-Pot Clavulanate] Rash    Tolerated Zosyn 03/26/23   Objective:  There were no vitals filed for this visit. There is no height or weight on file to calculate BMI. Constitutional Well developed. Well nourished.  Vascular Foot warm and well perfused. Capillary refill normal to all digits.   Neurologic Normal speech. Oriented to person, place, and time. Epicritic sensation to light touch grossly present bilaterally.  Dermatologic Skin healing well without signs of infection. Skin edges well coapted without signs of infection.  Orthopedic: Tenderness to palpation noted about the surgical site.   Radiographs: None Assessment:   1. History of amputation of left great toe [facility])    Plan:  Patient was evaluated and treated and all questions answered.  S/p foot surgery left -Progressing as expected post-operatively. -XR: See above -WB Status: Weightbearing as tolerated in surgical shoe -Sutures: Intact.  No clinical signs of dehiscence noted no complication noted. -Medications: None -Foot redressed.  No follow-ups on file.

## 2023-04-10 ENCOUNTER — Other Ambulatory Visit: Payer: Self-pay

## 2023-04-10 ENCOUNTER — Emergency Department
Admission: EM | Admit: 2023-04-10 | Discharge: 2023-04-10 | Disposition: A | Discharge status: Home / Self Care | Attending: Emergency Medicine | Admitting: Emergency Medicine

## 2023-04-10 DIAGNOSIS — Z5321 Procedure and treatment not carried out due to patient leaving prior to being seen by health care provider: Secondary | ICD-10-CM | POA: Diagnosis not present

## 2023-04-10 DIAGNOSIS — R110 Nausea: Secondary | ICD-10-CM | POA: Insufficient documentation

## 2023-04-10 DIAGNOSIS — Z79899 Other long term (current) drug therapy: Secondary | ICD-10-CM | POA: Diagnosis not present

## 2023-04-10 LAB — COMPREHENSIVE METABOLIC PANEL WITH GFR
ALT: 12 U/L (ref 0–44)
AST: 14 U/L — ABNORMAL LOW (ref 15–41)
Albumin: 3.5 g/dL (ref 3.5–5.0)
Alkaline Phosphatase: 65 U/L (ref 38–126)
Anion gap: 15 (ref 5–15)
BUN: 11 mg/dL (ref 6–20)
CO2: 19 mmol/L — ABNORMAL LOW (ref 22–32)
Calcium: 9.3 mg/dL (ref 8.9–10.3)
Chloride: 105 mmol/L (ref 98–111)
Creatinine, Ser: 0.71 mg/dL (ref 0.44–1.00)
GFR, Estimated: >60 mL/min (ref >60)
Glucose, Bld: 158 mg/dL — ABNORMAL HIGH (ref 70–99)
Potassium: 4.1 mmol/L (ref 3.5–5.1)
Sodium: 139 mmol/L (ref 135–145)
Total Bilirubin: 0.7 mg/dL (ref 0.0–1.2)
Total Protein: 7.6 g/dL (ref 6.5–8.1)

## 2023-04-10 LAB — LIPASE, BLOOD: Lipase: 27 U/L (ref 11–51)

## 2023-04-10 LAB — CBC
HCT: 41.9 % (ref 36.0–46.0)
Hemoglobin: 13.7 g/dL (ref 12.0–15.0)
MCH: 29.5 pg (ref 26.0–34.0)
MCHC: 32.7 g/dL (ref 30.0–36.0)
MCV: 90.1 fL (ref 80.0–100.0)
Platelets: 510 10*3/uL — ABNORMAL HIGH (ref 150–400)
RBC: 4.65 MIL/uL (ref 3.87–5.11)
RDW: 14 % (ref 11.5–15.5)
WBC: 12.6 10*3/uL — ABNORMAL HIGH (ref 4.0–10.5)
nRBC: 0 % (ref 0.0–0.2)

## 2023-04-10 NOTE — ED Triage Notes (Addendum)
 Pt comes via EMS from home with c/o nausea. Pt states this started at 11am today. Pt also states left big toe amputation and sepsis last week. Pt took zofran with no relief.  VSS  Pt states she had the toe amputation and vascular procedure. Pt denies any other issues

## 2023-04-10 NOTE — Telephone Encounter (Signed)
 Requested medications are due for refill today.  yes  Requested medications are on the active medications list.  yes  Last refill. 02/05/2023 #30 2 rf  Future visit scheduled.   yes  Notes to clinic.  Refill not delegated.    Requested Prescriptions  Pending Prescriptions Disp Refills   ondansetron (ZOFRAN-ODT) 4 MG disintegrating tablet [Pharmacy Med Name: ONDANSETRON 4 MG ODT] 30 tablet 2    Sig: DISSOLVE 1 TABLET ON THE TONGUE EVERY 8 HOURS AS NEEDED FOR NAUSEA AND VOMITING     Not Delegated - Gastroenterology: Antiemetics - ondansetron Failed - 04/10/2023  8:49 AM      Failed - This refill cannot be delegated      Passed - AST in normal range and within 360 days    AST  Date Value Ref Range Status  03/25/2023 15 15 - 41 U/L Final   SGOT(AST)  Date Value Ref Range Status  09/22/2013 17 15 - 37 Unit/L Final         Passed - ALT in normal range and within 360 days    ALT  Date Value Ref Range Status  03/25/2023 11 0 - 44 U/L Final   SGPT (ALT)  Date Value Ref Range Status  09/22/2013 16 U/L Final    Comment:    14-63 NOTE: New Reference Range 08/02/13          Passed - Valid encounter within last 6 months    Recent Outpatient Visits           1 month ago Acute conjunctivitis of both eyes, unspecified acute conjunctivitis type   [facility] Smitty Cords, DO

## 2023-04-13 ENCOUNTER — Telehealth: Payer: Self-pay

## 2023-04-13 ENCOUNTER — Encounter: Payer: Self-pay | Admitting: Family Medicine

## 2023-04-13 DIAGNOSIS — B379 Candidiasis, unspecified: Secondary | ICD-10-CM

## 2023-04-13 MED ORDER — FLUCONAZOLE 150 MG PO TABS: ORAL_TABLET | ORAL | 0 refills | Status: DC

## 2023-04-13 NOTE — Transitions of Care (Post Inpatient/ED Visit) (Signed)
 04/13/2023  Name: [name] MRN: [id number] DOB: 10/04/1965  Today's TOC FU Call Status: Today's TOC FU Call Status:: Successful TOC FU Call Completed TOC FU Call Complete Date: 04/13/23 Patient's Name and Date of Birth confirmed.  Transition Care Management Follow-up Telephone Call Date of Discharge: 04/10/23 Discharge Facility: [facility]) Type of Discharge: Emergency Department Reason for ED Visit: Other: (nausea) How have you been since you were released from the hospital?: Better Any questions or concerns?: No  Items Reviewed: Did you receive and understand the discharge instructions provided?: No Medications obtained,verified, and reconciled?: Yes (Medications Reviewed) Any new allergies since your discharge?: No Dietary orders reviewed?: NA Do you have support at home?: Yes People in Home: spouse  Medications Reviewed Today: Medications Reviewed Today     Reviewed by Karena Addison, LPN (Licensed Practical Nurse) on 04/13/23 at 1459  Med List Status: <None>   Medication Order Taking? Sig Documenting Provider Last Dose Status Informant  albuterol (VENTOLIN HFA) 108 (90 Base) MCG/ACT inhaler [id number] No INHALE 2 INHALATIONS BY MOUTH  EVERY 4 HOURS AS NEEDED FOR  WHEEZING OR FOR SHORTNESS OF  BREATH Karamalegos, Netta Neat, DO Taking Active Self           Med Note Clarene Reamer, MARIA   Wed Mar 25, 2023  8:28 PM) prn  aspirin EC 81 MG tablet [id number] No Take 1 tablet (81 mg total) by mouth daily at 6 (six) AM. Swallow whole. Charise Killian, MD Taking Active   atorvastatin (LIPITOR) 40 MG tablet [id number] No TAKE 1 TABLET BY MOUTH AT  BEDTIME Smitty Cords, DO Taking Active Self  azelastine (ASTELIN) 0.1 % nasal spray [id number] No Place 2 sprays into both nostrils 2 (two) times daily.  Patient not taking: Reported on 03/25/2023   Smitty Cords, DO Not Taking Active Pharmacy Records, Self  buPROPion (WELLBUTRIN XL)  150 MG 24 hr tablet [id number] No Take 1 tablet (150 mg total) by mouth daily. Smitty Cords, DO Taking Active Self  citalopram (CELEXA) 40 MG tablet [id number] No TAKE 1 TABLET BY MOUTH DAILY Smitty Cords, DO Taking Active Self  clopidogrel (PLAVIX) 75 MG tablet [id number] No TAKE ONE TABLET BY MOUTH DAILY AT [place] Score, Netta Neat, DO Taking Active Self  cyclobenzaprine (FLEXERIL) 10 MG tablet [id number] No Take 10 mg by mouth 3 (three) times daily. [provider] Taking Active Self  fluconazole (DIFLUCAN) 150 MG tablet [id number]  Take one tablet by mouth on Day 1. Repeat dose 2nd tablet on Day 3. Smitty Cords, DO  Active Self  fluticasone (FLONASE) 50 MCG/ACT nasal spray [id number] No Place 2 sprays into both nostrils daily. Use for 4-6 weeks then stop and use seasonally or as needed. Smitty Cords, DO Taking Active Self  gabapentin (NEURONTIN) 600 MG tablet [id number] No TAKE 1 TABLET BY MOUTH 4 TIMES  DAILY Smitty Cords, DO Taking Active Self  metFORMIN (GLUCOPHAGE) 1000 MG tablet [id number] No TAKE 1 TABLET BY MOUTH TWICE  DAILY WITH MEALS  Patient not taking: Reported on 03/02/2023   Smitty Cords, DO Not Taking Active Self  midodrine (PROAMATINE) 5 MG tablet [id number] No Take 1 tablet (5 mg total) by mouth 3 (three) times daily with meals. Debbe Odea, MD Taking Active Self  MOUNJARO 10 MG/0.5ML Pen [id number] No INJECT SUBCUTANEOUSLY 10 MG ONCE WEEKLY Karamalegos, Netta Neat, DO Taking Active Self  mupirocin ointment (BACTROBAN) 2 % [id number] No Apply 1 Application topically  2 (two) times daily. For 10 days Smitty Cords, DO Taking Active Self           Med Note Harlow Asa Mar 25, 2023  9:08 PM) Patient uses as needed  nystatin (MYCOSTATIN/NYSTOP) powder [id number] No APPLY 1 APPLICATION TOPICALLY THREE TIMES DAILY AS NEEDED FOR YEAST RASH INTERTRIGO Smitty Cords, DO  Taking Active Self           Med Note Sharia Reeve   Wed Mar 25, 2023  9:09 PM) prn  ondansetron (ZOFRAN-ODT) 4 MG disintegrating tablet [id number]  DISSOLVE 1 TABLET ON THE TONGUE EVERY 8 HOURS AS NEEDED FOR NAUSEA AND VOMITING Smitty Cords, DO  Active   oxyCODONE-acetaminophen (PERCOCET/ROXICET) 5-325 MG tablet [id number] No Take 2 tablets by mouth every 6 (six) hours as needed for moderate pain (pain score 4-6). Marcie Bal, NP Taking Active            Med Note Sharon Seller, CRYSTAL L   Mon Apr 06, 2023  2:58 PM) Uses 10 mg/650 Poor control   pantoprazole (PROTONIX) 40 MG tablet [id number] No TAKE 1 TABLET BY MOUTH DAILY AT  9 AM Smitty Cords, DO Taking Active Self  QUEtiapine (SEROQUEL) 100 MG tablet [id number] No TAKE 1 TABLET BY MOUTH DAILY AT  9 PM AT BEDTIME Smitty Cords, DO Taking Active Self  topiramate (TOPAMAX) 100 MG tablet [id number] No TAKE 1 TABLET BY MOUTH TWICE  DAILY Smitty Cords, DO Taking Active Self  TRELEGY ELLIPTA 100-62.5-25 MCG/ACT AEPB [id number] No USE 1 INHALATION BY MOUTH ONCE  DAILY AT THE SAME TIME EACH DAY Karamalegos, Netta Neat, DO Taking Active Self  trimethoprim-polymyxin b (POLYTRIM) ophthalmic solution [id number] No Place 1 drop into both eyes 4 (four) times daily. For 7-10 days  Patient not taking: Reported on 04/06/2023   Smitty Cords, DO Not Taking Active Self           Med Note Harlow Asa Mar 25, 2023  9:16 PM) Patient uses as needed  VITAMIN D PO [id number] No Take by mouth. [provider] Taking Active Self           Med Note Clarene Reamer, MARIA   Wed Mar 25, 2023  8:30 PM) Every wed.  Vitamin D, Ergocalciferol, (DRISDOL) 1.25 MG (50000 UNIT) CAPS capsule [id number] No TAKE 1 CAPSULE BY MOUTH EVERY WEDNESDAY @9AM  Karamalegos, Netta Neat, DO Taking Active Self            Home Care and Equipment/Supplies: Were Home Health Services Ordered?: NA Has Agency set up a time  to come to your home?: No Any new equipment or medical supplies ordered?: NA  Functional Questionnaire: Do you need assistance with bathing/showering or dressing?: No Do you need assistance with meal preparation?: No Do you need assistance with eating?: No Do you have difficulty maintaining continence: No Do you need assistance with getting out of bed/getting out of a chair/moving?: No Do you have difficulty managing or taking your medications?: No  Follow up appointments reviewed: PCP Follow-up appointment confirmed?: No (declined) MD Provider Line [phone] Given: No Specialist Hospital Follow-up appointment confirmed?: NA Do you need transportation to your follow-up appointment?: No Do you understand care options if your condition(s) worsen?: Yes-patient verbalized understanding    SIGNATURE Karena Addison, LPN [facility] Nurse Health Advisor Direct Dial [phone]

## 2023-04-14 ENCOUNTER — Encounter: Payer: Self-pay | Admitting: Cardiology

## 2023-04-14 ENCOUNTER — Other Ambulatory Visit: Payer: Self-pay | Admitting: Family Medicine

## 2023-04-14 ENCOUNTER — Other Ambulatory Visit: Payer: Self-pay

## 2023-04-14 DIAGNOSIS — K219 Gastro-esophageal reflux disease without esophagitis: Secondary | ICD-10-CM

## 2023-04-14 NOTE — Patient Outreach (Signed)
 Care Management  Transitions of Care Program Transitions of Care Post-discharge week 2  04/14/2023 Name: [name] MRN: [id number] DOB: 01/24/1965  Subjective: [name] is a 58 y.o. year old female who is a primary care patient of Smitty Cords, DO. The Care Management team was unable to reach the patient by phone to assess and address transitions of care needs.   Plan: Additional outreach attempts will be made to reach the patient enrolled in the [facility] Program (Post Inpatient/ED Visit).  Deidre Ala, BSN, RN [place]  VBCI - [employer] [facility] RN Care Manager [phone]

## 2023-04-15 ENCOUNTER — Telehealth: Payer: Self-pay

## 2023-04-15 ENCOUNTER — Other Ambulatory Visit: Payer: Self-pay

## 2023-04-15 NOTE — Patient Instructions (Signed)
 Visit Information  Thank you for taking time to visit with me today. Please don't hesitate to contact me if I can be of assistance to you before our next scheduled telephone appointment.  Our next appointment is by telephone on Wednesday April 9 at 2:15pm  Following is a copy of your care plan:   Goals Addressed             This Visit's Progress    TOC Outreach Program       Current Barriers:  Chronic Disease Management support and education needs related to CAD and DMII   RNCM Clinical Goal(s):  Patient will work with the Care Management team over the next 30 days to address Transition of Care Barriers: Medication access Medication Management Diet/Nutrition/Food Resources Support at home Provider appointments Home Health services Functional/Safety verbalize understanding of plan for management of CAD and DMII as evidenced by No re-admissions to the hospital in 30 days  through collaboration with RN Care manager, provider, and care team.   Interventions: Evaluation of current treatment plan related to  self management and patient's adherence to plan as established by provider   CAD Interventions: (Status:  New goal.) Short Term Goal Assessed understanding of CAD diagnosis Medications reviewed including medications utilized in CAD treatment plan Provided education on Importance of limiting foods high in cholesterol Reviewed Importance of taking all medications as prescribed Reviewed Importance of attending all scheduled provider appointments Screening for signs and symptoms of depression related to chronic disease state   Diabetes Interventions:  (Status:  New goal.) Short Term Goal Assessed patient's understanding of A1c goal: <6.5% Reviewed medications with patient and discussed importance of medication adherence Discussed plans with patient for ongoing care management follow up and provided patient with direct contact information for care management team Screening for  signs and symptoms of depression related to chronic disease state  Lab Results  Component Value Date   HGBA1C 5.2 05/06/2022    Patient Goals/Self-Care Activities: Participate in Transition of Care Program/Attend [facility] scheduled calls Notify RN Care Manager of [facility] call rescheduling needs Take all medications as prescribed Attend all scheduled provider appointments Call pharmacy for medication refills 3-7 days in advance of running out of medications  Follow Up Plan:  Telephone follow up appointment with care management team member scheduled for:  Wednesday  April 9th at 2:15pm         Patient verbalizes understanding of instructions and care plan provided today and agrees to view in MyChart. Active MyChart status and patient understanding of how to access instructions and care plan via MyChart confirmed with patient.     The patient has been provided with contact information for the care management team and has been advised to call with any health related questions or concerns.   Please call the care guide team at [phone] if you need to cancel or reschedule your appointment.   Please call the Suicide and Crisis Lifeline: 988 call the [place] National Suicide Prevention Lifeline: 404-002-6283 or TTY: 682 637 9291 TTY (303) 597-8302) to talk to a trained counselor if you are experiencing a Mental Health or Behavioral Health Crisis or need someone to talk to.  Deidre Ala, BSN, RN [place]  VBCI - [employer] [facility] RN Care Manager [phone]

## 2023-04-15 NOTE — Patient Outreach (Signed)
 Care Management  Transitions of Care Program Transitions of Care Post-discharge week 2   04/15/2023 Name: [name] MRN: [id number] DOB: 12-Mar-1965  Subjective: [name] is a 58 y.o. year old female who is a primary care patient of Smitty Cords, DO. The Care Management team Engaged with patient Engaged with patient by telephone to assess and address transitions of care needs.   Consent to Services:  Patient was given information about care management services, agreed to services, and gave verbal consent to participate.   Assessment: TOC Outreach completed to the patient today. The patient states she missed her [facility] start of care because she didn't answer the phone. She would like to see if the company will still come out. Called [facility] and unable to talk with some one to verify start of care. [facility] has a start of care date for 04/16/23. The patient states she did unwrap her foot and the incision is clean. She has 14 staples to her leg which are clean and dry. Her blood sugar today is 121. Reviewed medication and diet. The patient is bed bound and incontinent. Her husband gets her out of bed every 2-3 days via [place] lift and puts her in a motorized scooter. She wears depends for voiding. She did say [employer] contacted her for 14 days of meal service that she will be receiving.     SDOH Interventions    Flowsheet Row Telephone from 04/15/2023 in [place] POPULATION HEALTH DEPARTMENT Telephone from 04/06/2023 in [place] POPULATION HEALTH DEPARTMENT Clinical Support from 05/16/2022 in [facility] Office Visit from 05/06/2022 in [facility] Care Coordination from 12/18/2021 in [facility] Care Coordination Telephone from 12/16/2021 in [facility] Care Coordination  SDOH Interventions        Food Insecurity Interventions Intervention Not Indicated Intervention Not  Indicated Intervention Not Indicated -- Intervention Not Indicated Intervention Not Indicated  Housing Interventions Intervention Not Indicated Intervention Not Indicated Intervention Not Indicated -- Intervention Not Indicated Intervention Not Indicated  Transportation Interventions Intervention Not Indicated Intervention Not Indicated, Patient Resources (Friends/Family), Payor Benefit Intervention Not Indicated, Patient Resources Dietitian) -- Intervention Not Indicated --  Utilities Interventions Intervention Not Indicated Intervention Not Indicated Intervention Not Indicated -- -- --  Alcohol Usage Interventions -- -- Intervention Not Indicated (Score <7) -- -- --  Depression Interventions/Treatment  -- -- Medication Currently on Treatment, Counseling, Medication -- --  Financial Strain Interventions -- -- Intervention Not Indicated -- -- --  Physical Activity Interventions -- -- Patient Refused -- -- --  Stress Interventions -- -- Intervention Not Indicated -- -- --  Social Connections Interventions -- -- Intervention Not Indicated -- -- --        Goals Addressed             This Visit's Progress    TOC Outreach Program       Current Barriers:  Chronic Disease Management support and education needs related to CAD and DMII   RNCM Clinical Goal(s):  Patient will work with the Care Management team over the next 30 days to address Transition of Care Barriers: Medication access Medication Management Diet/Nutrition/Food Resources Support at home Provider appointments Home Health services Functional/Safety verbalize understanding of plan for management of CAD and DMII as evidenced by No re-admissions to the hospital in 30 days  through collaboration with RN Care manager, provider, and care team.   Interventions: Evaluation of current  treatment plan related to  self management and patient's adherence to plan as established by provider   CAD Interventions: (Status:  New  goal.) Short Term Goal Assessed understanding of CAD diagnosis Medications reviewed including medications utilized in CAD treatment plan Provided education on Importance of limiting foods high in cholesterol Reviewed Importance of taking all medications as prescribed Reviewed Importance of attending all scheduled provider appointments Screening for signs and symptoms of depression related to chronic disease state   Diabetes Interventions:  (Status:  New goal.) Short Term Goal Assessed patient's understanding of A1c goal: <6.5% Reviewed medications with patient and discussed importance of medication adherence Discussed plans with patient for ongoing care management follow up and provided patient with direct contact information for care management team Screening for signs and symptoms of depression related to chronic disease state  Lab Results  Component Value Date   HGBA1C 5.2 05/06/2022    Patient Goals/Self-Care Activities: Participate in Transition of Care Program/Attend [facility] scheduled calls Notify RN Care Manager of TOC call rescheduling needs Take all medications as prescribed Attend all scheduled provider appointments Call pharmacy for medication refills 3-7 days in advance of running out of medications  Follow Up Plan:  Telephone follow up appointment with care management team member scheduled for:  Wednesday  April 9th at 2:15pm         Plan: The patient has been provided with contact information for the care management team and has been advised to call with any health related questions or concerns.   Routine follow-up and on-going assessment evaluation and education of disease processes, and recommended interventions for both chronic and acute medical conditions, will occur during each weekly visit during [facility] 30-day Program Outreach calls along with ongoing review of symptoms, medication reviews and reconciliation. Any updates, inconsistencies, discrepancies or acute care  concerns will be addressed on the Care Plan and routed to the correct Practitioner if indicated.    The patient has been provided with contact information for the care management team and has been advised to call with any health-related questions or concerns. The patient verbalized understanding with current POC. The patient is directed to their insurance card regarding availability of benefits coverage.   Deidre Ala, BSN, RN [place]  VBCI - [employer] [facility] RN Care Manager [phone]

## 2023-04-16 DIAGNOSIS — Z48812 Encounter for surgical aftercare following surgery on the circulatory system: Secondary | ICD-10-CM | POA: Diagnosis not present

## 2023-04-16 DIAGNOSIS — Z7951 Long term (current) use of inhaled steroids: Secondary | ICD-10-CM | POA: Diagnosis not present

## 2023-04-16 DIAGNOSIS — Z89412 Acquired absence of left great toe: Secondary | ICD-10-CM | POA: Diagnosis not present

## 2023-04-16 DIAGNOSIS — Z7902 Long term (current) use of antithrombotics/antiplatelets: Secondary | ICD-10-CM | POA: Diagnosis not present

## 2023-04-16 DIAGNOSIS — Z7985 Long-term (current) use of injectable non-insulin antidiabetic drugs: Secondary | ICD-10-CM | POA: Diagnosis not present

## 2023-04-16 DIAGNOSIS — N200 Calculus of kidney: Secondary | ICD-10-CM | POA: Diagnosis not present

## 2023-04-16 DIAGNOSIS — M86172 Other acute osteomyelitis, left ankle and foot: Secondary | ICD-10-CM | POA: Diagnosis not present

## 2023-04-16 DIAGNOSIS — G43909 Migraine, unspecified, not intractable, without status migrainosus: Secondary | ICD-10-CM | POA: Diagnosis not present

## 2023-04-16 DIAGNOSIS — E785 Hyperlipidemia, unspecified: Secondary | ICD-10-CM | POA: Diagnosis not present

## 2023-04-16 DIAGNOSIS — J449 Chronic obstructive pulmonary disease, unspecified: Secondary | ICD-10-CM | POA: Diagnosis not present

## 2023-04-16 DIAGNOSIS — Z4781 Encounter for orthopedic aftercare following surgical amputation: Secondary | ICD-10-CM | POA: Diagnosis not present

## 2023-04-16 DIAGNOSIS — E1151 Type 2 diabetes mellitus with diabetic peripheral angiopathy without gangrene: Secondary | ICD-10-CM | POA: Diagnosis not present

## 2023-04-16 DIAGNOSIS — Z556 Problems related to health literacy: Secondary | ICD-10-CM | POA: Diagnosis not present

## 2023-04-16 DIAGNOSIS — I10 Essential (primary) hypertension: Secondary | ICD-10-CM | POA: Diagnosis not present

## 2023-04-16 DIAGNOSIS — E1169 Type 2 diabetes mellitus with other specified complication: Secondary | ICD-10-CM | POA: Diagnosis not present

## 2023-04-16 DIAGNOSIS — I69351 Hemiplegia and hemiparesis following cerebral infarction affecting right dominant side: Secondary | ICD-10-CM | POA: Diagnosis not present

## 2023-04-16 DIAGNOSIS — G8929 Other chronic pain: Secondary | ICD-10-CM | POA: Diagnosis not present

## 2023-04-16 DIAGNOSIS — F17200 Nicotine dependence, unspecified, uncomplicated: Secondary | ICD-10-CM | POA: Diagnosis not present

## 2023-04-16 NOTE — Telephone Encounter (Signed)
 Requested Prescriptions  Pending Prescriptions Disp Refills   pantoprazole (PROTONIX) 40 MG tablet [Pharmacy Med Name: Pantoprazole Sodium 40 MG Oral Tablet Delayed Release] 100 tablet 0    Sig: TAKE 1 TABLET BY MOUTH DAILY AT  9 AM     Gastroenterology: Proton Pump Inhibitors Passed - 04/16/2023  8:48 AM      Passed - Valid encounter within last 12 months    Recent Outpatient Visits           1 month ago Acute conjunctivitis of both eyes, unspecified acute conjunctivitis type   [facility] [place], Netta Neat, [place]

## 2023-04-21 ENCOUNTER — Encounter (INDEPENDENT_AMBULATORY_CARE_PROVIDER_SITE_OTHER): Payer: Self-pay | Admitting: Nurse Practitioner

## 2023-04-21 ENCOUNTER — Ambulatory Visit (INDEPENDENT_AMBULATORY_CARE_PROVIDER_SITE_OTHER): Admitting: Nurse Practitioner

## 2023-04-21 ENCOUNTER — Telehealth: Payer: Self-pay

## 2023-04-21 VITALS — BP 105/75 | HR 89 | Resp 16

## 2023-04-21 DIAGNOSIS — I739 Peripheral vascular disease, unspecified: Secondary | ICD-10-CM

## 2023-04-21 NOTE — Telephone Encounter (Signed)
 Attempted to return call, no answer, VM full. Okay to advise verbal orders if she calls back.

## 2023-04-21 NOTE — Telephone Encounter (Signed)
Okay for verbal orders as requested? 

## 2023-04-21 NOTE — Telephone Encounter (Signed)
 Copied from CRM [id number]. Topic: Clinical - Home Health Verbal Orders >> Apr 21, 2023  1:38 PM Elle L wrote: Caller/Agency: Nurse Aggie Cosier with [facility] Callback Number: [phone] Service Requested: Skilled Nursing, Home Health Aid, and Physical Therapy Frequency: Nursing once a week Home Health Aid twice a week and PT Eval Any new concerns about the patient? No

## 2023-04-21 NOTE — Progress Notes (Signed)
 Subjective:    Patient ID: [name], female    DOB: 1965/10/26, 58 y.o.   MRN: [id number] Chief Complaint  Patient presents with   Follow-up    3 day follow up    The patient underwent surgery on 04/01/2023, including:  PROCEDURE: 1.   Left common femoral artery endarterectomy and patch angioplasty 2.   Left lower extremity angiogram 3.   Stent placement to left popliteal artery with 5 mm x 6 cm Lifestent 4.   Stent placement to the proximal left SFA with 7 mm x 2.5 cm Viabahn stent   Today the patient is doing well post surgery.  The wound is primarily clean dry and intact except for in the groin area where it is a little bit moist.  She also underwent total hip rotation and per the patient this is also healing well.  It is currently followed by podiatry.    Review of Systems  Skin:  Positive for wound.  Neurological:  Positive for weakness.  All other systems reviewed and are negative.      Objective:   Physical Exam Vitals reviewed.  HENT:     Head: Normocephalic.  Cardiovascular:     Rate and Rhythm: Normal rate.  Pulmonary:     Effort: Pulmonary effort is normal.  Skin:    General: Skin is warm and dry.  Neurological:     Mental Status: She is alert and oriented to person, place, and time.  Psychiatric:        Mood and Affect: Mood normal.        Behavior: Behavior normal.        Thought Content: Thought content normal.        Judgment: Judgment normal.     BP 105/75   Pulse 89   Resp 16   Past Medical History:  Diagnosis Date   Anxiety    Cancer (HCC)    a spot on liver and treated    Complication of anesthesia    restless,easily upset   COPD (chronic obstructive pulmonary disease) (HCC)    Depression    Diabetes mellitus without complication (HCC)    Diverticulitis    Fatty liver    GERD (gastroesophageal reflux disease)    Headache(784.0)    migraines   History of kidney stones    Hyperlipidemia    Hypertension    PAD (peripheral  artery disease) (HCC)    Pneumonia    Restless    Stroke [facility])     Social History   Socioeconomic History   Marital status: Married    Spouse name: Not on file   Number of children: Not on file   Years of education: Not on file   Highest education level: 10th grade  Occupational History   Not on file  Tobacco Use   Smoking status: Every Day    Current packs/day: 1.00    Average packs/day: 1 pack/day for 20.0 years (20.0 ttl pk-yrs)    Types: Cigarettes   Smokeless tobacco: Never  Vaping Use   Vaping status: Never Used  Substance and Sexual Activity   Alcohol use: No   Drug use: Yes    Types: Marijuana, Oxycodone    Comment: last smoked 2 days ago  8/4   Sexual activity: Not on file  Other Topics Concern   Not on file  Social History Narrative   Not on file   Social Drivers of Health   Financial Resource Strain: Low  Risk  (11/19/2022)   Overall Financial Resource Strain (CARDIA)    Difficulty of Paying Living Expenses: Not very hard  Food Insecurity: No Food Insecurity (04/15/2023)   Hunger Vital Sign    Worried About Running Out of Food in the Last Year: Never true    Ran Out of Food in the Last Year: Never true  Transportation Needs: No Transportation Needs (04/15/2023)   PRAPARE - Administrator, Civil Service (Medical): No    Lack of Transportation (Non-Medical): No  Physical Activity: Inactive (11/19/2022)   Exercise Vital Sign    Days of Exercise per Week: 0 days    Minutes of Exercise per Session: 0 min  Stress: Stress Concern Present (11/19/2022)   [employer] of Occupational Health - Occupational Stress Questionnaire    Feeling of Stress : To some extent  Social Connections: Moderately Isolated (03/26/2023)   Social Connection and Isolation Panel [NHANES]    Frequency of Communication with Friends and Family: More than three times a week    Frequency of Social Gatherings with Friends and Family: Patient declined    Attends Religious  Services: Never    Database administrator or Organizations: No    Attends Banker Meetings: Never    Marital Status: Married  Catering manager Violence: Not At Risk (04/15/2023)   Humiliation, Afraid, Rape, and Kick questionnaire    Fear of Current or Ex-Partner: No    Emotionally Abused: No    Physically Abused: No    Sexually Abused: No    Past Surgical History:  Procedure Laterality Date   ABDOMINAL HYSTERECTOMY     ACHILLES TENDON LENGTHENING Right 08/18/2017   Procedure: ACHILLES TENDON LENGTHENING;  Surgeon: Myrene Galas, MD;  Location: [facility] OR;  Service: Orthopedics;  Laterality: Right;   AMPUTATION TOE Left 04/02/2023   Procedure: AMPUTATION, TOE;  Surgeon: Pilar Plate, DPM;  Location: [facility] ORS;  Service: Orthopedics/Podiatry;  Laterality: Left;  LEFT GREAT TOE   ANTERIOR CERVICAL DECOMP/DISCECTOMY FUSION N/A 03/11/2012   Procedure: ANTERIOR CERVICAL DECOMPRESSION/DISCECTOMY FUSION 1 LEVEL;  Surgeon: Cristi Loron, MD;  Location: [facility]URO [facility];  Service: Neurosurgery;  Laterality: N/A;  Cervical five-six anterior cervical decompression with fusion interbody prothesis plating and bonegraft   BACK SURGERY     ENDARTERECTOMY FEMORAL Left 04/01/2023   Procedure: ENDARTERECTOMY, FEMORAL;  Surgeon: Renford Dills, MD;  Location: [facility] ORS;  Service: Vascular;  Laterality: Left;  with Popiteal stent placment   EXTERNAL FIXATION LEG Right 08/10/2017   Procedure: EXTERNAL FIXATION ANKLE;  Surgeon: Donato Heinz, MD;  Location: [facility] ORS;  Service: Orthopedics;  Laterality: Right;   EXTERNAL FIXATION REMOVAL Right 08/18/2017   Procedure: REMOVAL EXTERNAL FIXATION LEG;  Surgeon: Myrene Galas, MD;  Location: [facility] OR;  Service: Orthopedics;  Laterality: Right;   EYE SURGERY     HERNIA REPAIR     IR FL GUIDED LOC OF NEEDLE/CATH TIP FOR SPINAL INJECTION LT  12/09/2021   IR FL GUIDED LOC OF NEEDLE/CATH TIP FOR SPINAL INJECTION RT  12/09/2021   JOINT REPLACEMENT     bil  knees   LOWER EXTREMITY ANGIOGRAPHY Left 03/27/2023   Procedure: Lower Extremity Angiography;  Surgeon: Renford Dills, MD;  Location: [facility] INVASIVE CV LAB;  Service: Cardiovascular;  Laterality: Left;   ORIF ANKLE FRACTURE Right 08/18/2017   Procedure: OPEN REDUCTION INTERNAL FIXATION (ORIF) ANKLE FRACTURE;  Surgeon: Myrene Galas, MD;  Location: [facility] OR;  Service: Orthopedics;  Laterality: Right;  REPLACEMENT TOTAL KNEE BILATERAL      Family History  Problem Relation Age of Onset   Diabetes Mother    Hypertension Mother    Hypertension Father     Allergies  Allergen Reactions   Tramadol Nausea And Vomiting and Hypertension   Augmentin [Amoxicillin-Pot Clavulanate] Rash    Tolerated Zosyn 03/26/23       Latest Ref Rng & Units 04/10/2023    3:00 PM 04/02/2023    1:15 PM 04/01/2023   11:36 PM  CBC  WBC 4.0 - 10.5 K/uL 12.6  14.3  10.7   Hemoglobin 12.0 - 15.0 g/dL 52.8  41.3  24.4   Hematocrit 36.0 - 46.0 % 41.9  37.0  35.2   Platelets 150 - 400 K/uL 510  294  287       CMP     Component Value Date/Time   NA 139 04/10/2023 1500   NA 135 (L) 09/22/2013 2251   K 4.1 04/10/2023 1500   K 3.8 09/22/2013 2251   CL 105 04/10/2023 1500   CL 100 09/22/2013 2251   CO2 19 (L) 04/10/2023 1500   CO2 27 09/22/2013 2251   GLUCOSE 158 (H) 04/10/2023 1500   GLUCOSE 335 (H) 09/22/2013 2251   BUN 11 04/10/2023 1500   BUN 11 09/22/2013 2251   CREATININE 0.71 04/10/2023 1500   CREATININE 0.62 05/15/2021 1351   CALCIUM 9.3 04/10/2023 1500   CALCIUM 8.3 (L) 09/22/2013 2251   PROT 7.6 04/10/2023 1500   PROT 7.2 09/22/2013 2251   ALBUMIN 3.5 04/10/2023 1500   ALBUMIN 2.6 (L) 09/22/2013 2251   AST 14 (L) 04/10/2023 1500   AST 17 09/22/2013 2251   ALT 12 04/10/2023 1500   ALT 16 09/22/2013 2251   ALKPHOS 65 04/10/2023 1500   ALKPHOS 78 09/22/2013 2251   BILITOT 0.7 04/10/2023 1500   BILITOT 0.2 09/22/2013 2251   GFR 91.12 09/23/2017 1627   EGFR 104 05/15/2021 1351   GFRNONAA >60  04/10/2023 1500   GFRNONAA >60 09/22/2013 2251     No results found.     Assessment & Plan:   1. PAD (peripheral artery disease) (HCC) (Primary) Having the staples removed from the patient's incision today given some moist appearance in the groin area.  Steri-Strips were applied with a dressing placed.  She will return in 1 week for further staple removal.   Current Outpatient Medications on File Prior to Visit  Medication Sig Dispense Refill   albuterol (VENTOLIN HFA) 108 (90 Base) MCG/ACT inhaler INHALE 2 INHALATIONS BY MOUTH  EVERY 4 HOURS AS NEEDED FOR  WHEEZING OR FOR SHORTNESS OF  BREATH 51 g 2   aspirin EC 81 MG tablet Take 1 tablet (81 mg total) by mouth daily at 6 (six) AM. Swallow whole. 30 tablet 0   atorvastatin (LIPITOR) 40 MG tablet TAKE 1 TABLET BY MOUTH AT  BEDTIME 90 tablet 3   buPROPion (WELLBUTRIN XL) 150 MG 24 hr tablet Take 1 tablet (150 mg total) by mouth daily. 100 tablet 1   citalopram (CELEXA) 40 MG tablet TAKE 1 TABLET BY MOUTH DAILY 100 tablet 1   clopidogrel (PLAVIX) 75 MG tablet TAKE ONE TABLET BY MOUTH DAILY AT 9AM 100 tablet 11   cyclobenzaprine (FLEXERIL) 10 MG tablet Take 10 mg by mouth 3 (three) times daily.     fluconazole (DIFLUCAN) 150 MG tablet Take one tablet by mouth on Day 1. Repeat dose 2nd tablet on Day 3. 2 tablet 0  fluticasone (FLONASE) 50 MCG/ACT nasal spray Place 2 sprays into both nostrils daily. Use for 4-6 weeks then stop and use seasonally or as needed. 16 g 0   gabapentin (NEURONTIN) 600 MG tablet TAKE 1 TABLET BY MOUTH 4 TIMES  DAILY 400 tablet 2   midodrine (PROAMATINE) 5 MG tablet Take 1 tablet (5 mg total) by mouth 3 (three) times daily with meals. 21 tablet 0   MOUNJARO 10 MG/0.5ML Pen INJECT SUBCUTANEOUSLY 10 MG ONCE WEEKLY 6 mL 3   mupirocin ointment (BACTROBAN) 2 % Apply 1 Application topically 2 (two) times daily. For 10 days 22 g 0   nystatin (MYCOSTATIN/NYSTOP) powder APPLY 1 APPLICATION TOPICALLY THREE TIMES DAILY AS  NEEDED FOR YEAST RASH INTERTRIGO 30 g 11   ondansetron (ZOFRAN-ODT) 4 MG disintegrating tablet DISSOLVE 1 TABLET ON THE TONGUE EVERY 8 HOURS AS NEEDED FOR NAUSEA AND VOMITING 30 tablet 2   oxyCODONE-acetaminophen (PERCOCET/ROXICET) 5-325 MG tablet Take 2 tablets by mouth every 6 (six) hours as needed for moderate pain (pain score 4-6). 20 tablet 0   pantoprazole (PROTONIX) 40 MG tablet TAKE 1 TABLET BY MOUTH DAILY AT  9 AM 100 tablet 0   QUEtiapine (SEROQUEL) 100 MG tablet TAKE 1 TABLET BY MOUTH DAILY AT  9 PM AT BEDTIME 100 tablet 2   topiramate (TOPAMAX) 100 MG tablet TAKE 1 TABLET BY MOUTH TWICE  DAILY 200 tablet 2   TRELEGY ELLIPTA 100-62.5-25 MCG/ACT AEPB USE 1 INHALATION BY MOUTH ONCE  DAILY AT THE SAME TIME EACH DAY 180 each 3   VITAMIN D PO Take by mouth.     Vitamin D, Ergocalciferol, (DRISDOL) 1.25 MG (50000 UNIT) CAPS capsule TAKE 1 CAPSULE BY MOUTH EVERY WEDNESDAY @9AM  4 capsule 11   azelastine (ASTELIN) 0.1 % nasal spray Place 2 sprays into both nostrils 2 (two) times daily. (Patient not taking: Reported on 03/25/2023) 30 mL 12   metFORMIN (GLUCOPHAGE) 1000 MG tablet TAKE 1 TABLET BY MOUTH TWICE  DAILY WITH MEALS (Patient not taking: Reported on 03/02/2023) 200 tablet 2   trimethoprim-polymyxin b (POLYTRIM) ophthalmic solution Place 1 drop into both eyes 4 (four) times daily. For 7-10 days (Patient not taking: Reported on 04/06/2023) 10 mL 0   No current facility-administered medications on file prior to visit.    There are no Patient Instructions on file for this visit. No follow-ups on file.   Georgiana Spinner, NP

## 2023-04-22 ENCOUNTER — Other Ambulatory Visit: Payer: Self-pay

## 2023-04-22 NOTE — Patient Instructions (Signed)
 Visit Information  Thank you for taking time to visit with me today. Please don't hesitate to contact me if I can be of assistance to you before our next scheduled telephone appointment.  Our next appointment is by telephone on Wednesday April 16th at 2:30pm  Following is a copy of your care plan:   Goals Addressed             This Visit's Progress    TOC Outreach Program   On track    Current Barriers: ( Reviewed 04/22/23) Chronic Disease Management support and education needs related to CAD and DMII   RNCM Clinical Goal(s): ( Reviewed 04/22/23) Patient will work with the Care Management team over the next 30 days to address Transition of Care Barriers: Medication access Medication Management Diet/Nutrition/Food Resources Support at home Provider appointments Home Health services Functional/Safety verbalize understanding of plan for management of CAD and DMII as evidenced by No re-admissions to the hospital in 30 days  through collaboration with RN Care manager, provider, and care team.   Interventions: ( Reviewed 04/22/23) Evaluation of current treatment plan related to  self management and patient's adherence to plan as established by provider   CAD Interventions: (Status:  Goal on track:  Yes.) Short Term Goal ( Reviewed 04/22/23) Assessed understanding of CAD diagnosis Medications reviewed including medications utilized in CAD treatment plan Provided education on Importance of limiting foods high in cholesterol Reviewed Importance of taking all medications as prescribed Reviewed Importance of attending all scheduled provider appointments Screening for signs and symptoms of depression related to chronic disease state ROM in the bed to improve circulation Work with HHPT to improve strength   Diabetes Interventions: (Status:  Goal on track:  Yes.) Short Term Goal ( Reviewed 04/22/23) Assessed patient's understanding of A1c goal: <6.5% Reviewed medications with patient and  discussed importance of medication adherence Discussed plans with patient for ongoing care management follow up and provided patient with direct contact information for care management team Screening for signs and symptoms of depression related to chronic disease state  Daily monitoring of blood sugar Lab Results  Component Value Date   HGBA1C 5.2 05/06/2022   Observe extremities for non-healing wounds  - Currently with toe amputation. Keep dressing on incision and elevate foot while in bed  Patient Goals/Self-Care Activities: ( Reviewed 04/22/23) Participate in Transition of Care Program/Attend [facility] scheduled calls Notify RN Care Manager of [facility] call rescheduling needs Take all medications as prescribed Attend all scheduled provider appointments Call pharmacy for medication refills 3-7 days in advance of running out of medications  Follow Up Plan:  Telephone follow up appointment with care management team member scheduled for:  Wednesday  April 16 at 2:30pm         Patient verbalizes understanding of instructions and care plan provided today and agrees to view in MyChart. Active MyChart status and patient understanding of how to access instructions and care plan via MyChart confirmed with patient.     The patient has been provided with contact information for the care management team and has been advised to call with any health related questions or concerns.   Please call the care guide team at [phone] if you need to cancel or reschedule your appointment.   Please call the Suicide and Crisis Lifeline: 988 call the [place] National Suicide Prevention Lifeline: 3160500785 or TTY: 6710366648 TTY [phone]) to talk to a trained counselor if you are experiencing a Mental Health or Behavioral Health Crisis or need someone  to talk to. Deidre Ala, BSN, RN [place]  VBCI - [employer] [facility] RN Care Manager [phone]

## 2023-04-22 NOTE — Patient Outreach (Signed)
 Transition of Care week 3  Visit Note  04/22/2023  Name: [name] MRN: [id number]          DOB: Jun 15, 1965  Situation: Patient enrolled in [facility] 30-day program. Visit completed with Twana First by telephone.   Background:     Past Medical History:  Diagnosis Date   Anxiety    Cancer (HCC)    a spot on liver and treated    Complication of anesthesia    restless,easily upset   COPD (chronic obstructive pulmonary disease) (HCC)    Depression    Diabetes mellitus without complication (HCC)    Diverticulitis    Fatty liver    GERD (gastroesophageal reflux disease)    Headache(784.0)    migraines   History of kidney stones    Hyperlipidemia    Hypertension    PAD (peripheral artery disease) (HCC)    Pneumonia    Restless    Stroke (HCC)     Assessment: TOC Outreach completed today. The patient was waking up when [facility] called. She has not taken her medication or her blood sugar yet. She reports blood sugar yesterday was 114. She states she is tired because she went to the surgeon and had some of her staples removed. She goes tomorrow 04/23/23 to have her stitches removed. She confirms that her meal service has started and HHPT is coming out on Friday 04/24/23.  Patient Reported Symptoms:  Cognitive @FLOW ([id number]::1))@  Neurological Numbness (feet)    HEENT No symptoms reported    Cardiovascular No symptoms reported    Respiratory No symptoms reported    Endocrine No symptoms reported    Gastrointestinal Incontinence    Genitourinary Incontinence    Integumentary Incision, Wound    Musculoskeletal Difficulty walking    Psychosocial No symptoms reported     There were no vitals filed for this visit.  Medications Reviewed Today     Reviewed by Redge Gainer, RN (Case Manager) on 04/22/23 at 1446  Med List Status: <None>   Medication Order Taking? Sig Documenting Provider Last Dose Status Informant  albuterol (VENTOLIN HFA) 108 (90 Base) MCG/ACT inhaler  [id number] No INHALE 2 INHALATIONS BY MOUTH  EVERY 4 HOURS AS NEEDED FOR  WHEEZING OR FOR SHORTNESS OF  BREATH Karamalegos, Netta Neat, DO Taking Active Self           Med Note Clarene Reamer, MARIA   Wed Mar 25, 2023  8:28 PM) prn  aspirin EC 81 MG tablet [id number] No Take 1 tablet (81 mg total) by mouth daily at 6 (six) AM. Swallow whole. Charise Killian, MD Taking Active   atorvastatin (LIPITOR) 40 MG tablet [id number] No TAKE 1 TABLET BY MOUTH AT  BEDTIME Smitty Cords, DO Taking Active Self  azelastine (ASTELIN) 0.1 % nasal spray [id number] No Place 2 sprays into both nostrils 2 (two) times daily.  Patient not taking: Reported on 03/25/2023   Smitty Cords, DO Not Taking Active Pharmacy Records, Self  buPROPion (WELLBUTRIN XL) 150 MG 24 hr tablet [id number] No Take 1 tablet (150 mg total) by mouth daily. Smitty Cords, DO Taking Active Self  citalopram (CELEXA) 40 MG tablet [id number] No TAKE 1 TABLET BY MOUTH DAILY Smitty Cords, DO Taking Active Self  clopidogrel (PLAVIX) 75 MG tablet [id number] No TAKE ONE TABLET BY MOUTH DAILY AT [place] Score, Netta Neat, DO Taking Active Self  cyclobenzaprine (FLEXERIL) 10 MG tablet [id number] No Take 10 mg by mouth 3 (three) times daily. [provider] Taking Active Self  fluconazole (DIFLUCAN) 150 MG tablet [id number] No Take one tablet by mouth on Day 1. Repeat dose 2nd tablet on Day 3. Smitty Cords, DO Taking Active   fluticasone (FLONASE) 50 MCG/ACT nasal spray [id number] No Place 2 sprays into both nostrils daily. Use for 4-6 weeks then stop and use seasonally or as needed. Smitty Cords, DO Taking Active Self  gabapentin (NEURONTIN) 600 MG tablet [id number] No TAKE 1 TABLET BY MOUTH 4 TIMES  DAILY Smitty Cords, DO Taking Active Self  metFORMIN (GLUCOPHAGE) 1000 MG tablet [id number] No TAKE 1 TABLET BY MOUTH TWICE  DAILY WITH MEALS  Patient not taking: Reported on  03/02/2023   Smitty Cords, DO Not Taking Active Self  midodrine (PROAMATINE) 5 MG tablet [id number] No Take 1 tablet (5 mg total) by mouth 3 (three) times daily with meals. Debbe Odea, MD Taking Active Self  MOUNJARO 10 MG/0.5ML Pen [id number] No INJECT SUBCUTANEOUSLY 10 MG ONCE WEEKLY Karamalegos, Netta Neat, DO Taking Active Self  mupirocin ointment (BACTROBAN) 2 % [id number] No Apply 1 Application topically 2 (two) times daily. For 10 days Smitty Cords, DO Taking Active Self           Med Note Harlow Asa Mar 25, 2023  9:08 PM) Patient uses as needed  nystatin (MYCOSTATIN/NYSTOP) powder [id number] No APPLY 1 APPLICATION TOPICALLY THREE TIMES DAILY AS NEEDED FOR YEAST RASH INTERTRIGO Smitty Cords, DO Taking Active Self           Med Note Sharia Reeve   Wed Mar 25, 2023  9:09 PM) prn  ondansetron (ZOFRAN-ODT) 4 MG disintegrating tablet [id number] No DISSOLVE 1 TABLET ON THE TONGUE EVERY 8 HOURS AS NEEDED FOR NAUSEA AND VOMITING Smitty Cords, DO Taking Active   oxyCODONE-acetaminophen (PERCOCET/ROXICET) 5-325 MG tablet [id number] No Take 2 tablets by mouth every 6 (six) hours as needed for moderate pain (pain score 4-6). Marcie Bal, NP Taking Active            Med Note Sharon Seller, CRYSTAL L   Mon Apr 06, 2023  2:58 PM) Uses 10 mg/650 Poor control   pantoprazole (PROTONIX) 40 MG tablet [id number] No TAKE 1 TABLET BY MOUTH DAILY AT  9 AM Karamalegos, Netta Neat, DO Taking Active   QUEtiapine (SEROQUEL) 100 MG tablet [id number] No TAKE 1 TABLET BY MOUTH DAILY AT  9 PM AT BEDTIME Smitty Cords, DO Taking Active Self  topiramate (TOPAMAX) 100 MG tablet [id number] No TAKE 1 TABLET BY MOUTH TWICE  DAILY Smitty Cords, DO Taking Active Self  TRELEGY ELLIPTA 100-62.5-25 MCG/ACT AEPB [id number] No USE 1 INHALATION BY MOUTH ONCE  DAILY AT THE SAME TIME EACH DAY Karamalegos, Netta Neat, DO Taking Active Self   trimethoprim-polymyxin b (POLYTRIM) ophthalmic solution [id number] No Place 1 drop into both eyes 4 (four) times daily. For 7-10 days  Patient not taking: Reported on 04/06/2023   Smitty Cords, DO Not Taking Active Self           Med Note Harlow Asa Mar 25, 2023  9:16 PM) Patient uses as needed  VITAMIN D PO [id number] No Take by mouth. [provider] Taking Active Self           Med Note Clarene Reamer, MARIA   Wed Mar 25, 2023  8:30 PM) Every wed.  Vitamin D, Ergocalciferol, (DRISDOL) 1.25 MG (50000 UNIT) CAPS capsule [id number] No  TAKE 1 CAPSULE BY MOUTH EVERY WEDNESDAY @9AM  Smitty Cords, DO Taking Active Self            Recommendation:   Specialty provider follow-up Dr. Manson Passey on 04/28/23 Blood glucose monitoring daily Watch for signs and symptoms of infection to left great toe amputation and to groin where vein was taken Skin integrity due to incontinence of bowel and bladder  Follow Up Plan:   Telephone follow-up in 1 week  Deidre Ala, BSN, RN [place]  VBCI - [facility] RN Care Manager [phone]

## 2023-04-23 ENCOUNTER — Ambulatory Visit (INDEPENDENT_AMBULATORY_CARE_PROVIDER_SITE_OTHER): Admitting: Podiatry

## 2023-04-23 DIAGNOSIS — Z89412 Acquired absence of left great toe: Secondary | ICD-10-CM | POA: Diagnosis not present

## 2023-04-23 NOTE — Progress Notes (Signed)
 Subjective:  Patient ID: [name], female    DOB: Aug 05, 1965,  MRN: [id number]  Chief Complaint  Patient presents with   Routine Post Op    History of amputation of left great toe , suture removal     DOS: 04/02/2023 Procedure: Left partial hallux amputation  58 y.o. female returns for post-op check.  Patient states that she is doing well denies any other acute complaints pain is controlled  Review of Systems: Negative except as noted in the HPI. Denies N/V/F/Ch.  Past Medical History:  Diagnosis Date   Anxiety    Cancer (HCC)    a spot on liver and treated    Complication of anesthesia    restless,easily upset   COPD (chronic obstructive pulmonary disease) (HCC)    Depression    Diabetes mellitus without complication (HCC)    Diverticulitis    Fatty liver    GERD (gastroesophageal reflux disease)    Headache(784.0)    migraines   History of kidney stones    Hyperlipidemia    Hypertension    PAD (peripheral artery disease) (HCC)    Pneumonia    Restless    Stroke [facility])     Current Outpatient Medications:    celecoxib (CELEBREX) 100 MG capsule, Take 100 mg by mouth daily., Disp: , Rfl:    ciprofloxacin (CIPRO) 500 MG tablet, Take 500 mg by mouth 2 (two) times daily., Disp: , Rfl:    doxycycline (MONODOX) 100 MG capsule, Take 100 mg by mouth 2 (two) times daily., Disp: , Rfl:    methocarbamol (ROBAXIN) 500 MG tablet, Take 500 mg by mouth 3 (three) times daily., Disp: , Rfl:    nicotine (NICODERM CQ - DOSED IN MG/24 HOURS) 14 mg/24hr patch, Place onto the skin., Disp: , Rfl:    SPIKEVAX syringe, , Disp: , Rfl:    albuterol (VENTOLIN HFA) 108 (90 Base) MCG/ACT inhaler, INHALE 2 INHALATIONS BY MOUTH  EVERY 4 HOURS AS NEEDED FOR  WHEEZING OR FOR SHORTNESS OF  BREATH, Disp: 51 g, Rfl: 2   aspirin EC 81 MG tablet, Take 1 tablet (81 mg total) by mouth daily at 6 (six) AM. Swallow whole., Disp: 30 tablet, Rfl: 0   atorvastatin (LIPITOR) 40 MG tablet, TAKE 1 TABLET BY MOUTH  AT  BEDTIME, Disp: 90 tablet, Rfl: 3   azelastine (ASTELIN) 0.1 % nasal spray, Place 2 sprays into both nostrils 2 (two) times daily. (Patient not taking: Reported on 03/25/2023), Disp: 30 mL, Rfl: 12   buPROPion (WELLBUTRIN XL) 150 MG 24 hr tablet, Take 1 tablet (150 mg total) by mouth daily., Disp: 100 tablet, Rfl: 1   citalopram (CELEXA) 40 MG tablet, TAKE 1 TABLET BY MOUTH DAILY, Disp: 100 tablet, Rfl: 1   clopidogrel (PLAVIX) 75 MG tablet, TAKE ONE TABLET BY MOUTH DAILY AT 9AM, Disp: 100 tablet, Rfl: 11   cyclobenzaprine (FLEXERIL) 10 MG tablet, Take 10 mg by mouth 3 (three) times daily., Disp: , Rfl:    fluconazole (DIFLUCAN) 150 MG tablet, Take one tablet by mouth on Day 1. Repeat dose 2nd tablet on Day 3., Disp: 2 tablet, Rfl: 0   fluticasone (FLONASE) 50 MCG/ACT nasal spray, Place 2 sprays into both nostrils daily. Use for 4-6 weeks then stop and use seasonally or as needed., Disp: 16 g, Rfl: 0   gabapentin (NEURONTIN) 600 MG tablet, TAKE 1 TABLET BY MOUTH 4 TIMES  DAILY, Disp: 400 tablet, Rfl: 2   metFORMIN (GLUCOPHAGE) 1000 MG tablet, TAKE  1 TABLET BY MOUTH TWICE  DAILY WITH MEALS (Patient not taking: Reported on 03/02/2023), Disp: 200 tablet, Rfl: 2   midodrine (PROAMATINE) 5 MG tablet, Take 1 tablet (5 mg total) by mouth 3 (three) times daily with meals., Disp: 21 tablet, Rfl: 0   MOUNJARO 10 MG/0.5ML Pen, INJECT SUBCUTANEOUSLY 10 MG ONCE WEEKLY, Disp: 6 mL, Rfl: 3   mupirocin ointment (BACTROBAN) 2 %, Apply 1 Application topically 2 (two) times daily. For 10 days, Disp: 22 g, Rfl: 0   nystatin (MYCOSTATIN/NYSTOP) powder, APPLY 1 APPLICATION TOPICALLY THREE TIMES DAILY AS NEEDED FOR YEAST RASH INTERTRIGO, Disp: 30 g, Rfl: 11   ondansetron (ZOFRAN-ODT) 4 MG disintegrating tablet, DISSOLVE 1 TABLET ON THE TONGUE EVERY 8 HOURS AS NEEDED FOR NAUSEA AND VOMITING, Disp: 30 tablet, Rfl: 2   oxyCODONE-acetaminophen (PERCOCET/ROXICET) 5-325 MG tablet, Take 2 tablets by mouth every 6 (six) hours as  needed for moderate pain (pain score 4-6)., Disp: 20 tablet, Rfl: 0   pantoprazole (PROTONIX) 40 MG tablet, TAKE 1 TABLET BY MOUTH DAILY AT  9 AM, Disp: 100 tablet, Rfl: 0   QUEtiapine (SEROQUEL) 100 MG tablet, TAKE 1 TABLET BY MOUTH DAILY AT  9 PM AT BEDTIME, Disp: 100 tablet, Rfl: 2   topiramate (TOPAMAX) 100 MG tablet, TAKE 1 TABLET BY MOUTH TWICE  DAILY, Disp: 200 tablet, Rfl: 2   TRELEGY ELLIPTA 100-62.5-25 MCG/ACT AEPB, USE 1 INHALATION BY MOUTH ONCE  DAILY AT THE SAME TIME EACH DAY, Disp: 180 each, Rfl: 3   trimethoprim-polymyxin b (POLYTRIM) ophthalmic solution, Place 1 drop into both eyes 4 (four) times daily. For 7-10 days (Patient not taking: Reported on 04/06/2023), Disp: 10 mL, Rfl: 0   VITAMIN D PO, Take by mouth., Disp: , Rfl:    Vitamin D, Ergocalciferol, (DRISDOL) 1.25 MG (50000 UNIT) CAPS capsule, TAKE 1 CAPSULE BY MOUTH EVERY WEDNESDAY @9AM , Disp: 4 capsule, Rfl: 11  Social History   Tobacco Use  Smoking Status Every Day   Current packs/day: 1.00   Average packs/day: 1 pack/day for 20.0 years (20.0 ttl pk-yrs)   Types: Cigarettes  Smokeless Tobacco Never    Allergies  Allergen Reactions   Tramadol Nausea And Vomiting and Hypertension   Augmentin [Amoxicillin-Pot Clavulanate] Rash    Tolerated Zosyn 03/26/23   Objective:  There were no vitals filed for this visit. There is no height or weight on file to calculate BMI. Constitutional Well developed. Well nourished.  Vascular Foot warm and well perfused. Capillary refill normal to all digits.   Neurologic Normal speech. Oriented to person, place, and time. Epicritic sensation to light touch grossly present bilaterally.  Dermatologic Skin completely reepithelialized.  No signs of Deis and no no complication noted.    Orthopedic: No further tenderness to palpation noted about the surgical site.   Radiographs: None Assessment:   1. History of amputation of left great toe [facility])     Plan:  Patient was evaluated  and treated and all questions answered.  S/p foot surgery left - Clinically healed and officially discharged from care if any foot and ankle issues in the future she will come back and see me return to regular shoes and activities. No follow-ups on file.

## 2023-04-24 DIAGNOSIS — E1151 Type 2 diabetes mellitus with diabetic peripheral angiopathy without gangrene: Secondary | ICD-10-CM | POA: Diagnosis not present

## 2023-04-24 DIAGNOSIS — Z48812 Encounter for surgical aftercare following surgery on the circulatory system: Secondary | ICD-10-CM | POA: Diagnosis not present

## 2023-04-24 DIAGNOSIS — E1169 Type 2 diabetes mellitus with other specified complication: Secondary | ICD-10-CM | POA: Diagnosis not present

## 2023-04-24 DIAGNOSIS — G8929 Other chronic pain: Secondary | ICD-10-CM | POA: Diagnosis not present

## 2023-04-24 DIAGNOSIS — N200 Calculus of kidney: Secondary | ICD-10-CM | POA: Diagnosis not present

## 2023-04-24 DIAGNOSIS — I10 Essential (primary) hypertension: Secondary | ICD-10-CM | POA: Diagnosis not present

## 2023-04-24 DIAGNOSIS — J449 Chronic obstructive pulmonary disease, unspecified: Secondary | ICD-10-CM | POA: Diagnosis not present

## 2023-04-24 DIAGNOSIS — F17200 Nicotine dependence, unspecified, uncomplicated: Secondary | ICD-10-CM | POA: Diagnosis not present

## 2023-04-24 DIAGNOSIS — G43909 Migraine, unspecified, not intractable, without status migrainosus: Secondary | ICD-10-CM | POA: Diagnosis not present

## 2023-04-24 DIAGNOSIS — Z7951 Long term (current) use of inhaled steroids: Secondary | ICD-10-CM | POA: Diagnosis not present

## 2023-04-24 DIAGNOSIS — Z7985 Long-term (current) use of injectable non-insulin antidiabetic drugs: Secondary | ICD-10-CM | POA: Diagnosis not present

## 2023-04-24 DIAGNOSIS — Z556 Problems related to health literacy: Secondary | ICD-10-CM | POA: Diagnosis not present

## 2023-04-24 DIAGNOSIS — E785 Hyperlipidemia, unspecified: Secondary | ICD-10-CM | POA: Diagnosis not present

## 2023-04-24 DIAGNOSIS — M86172 Other acute osteomyelitis, left ankle and foot: Secondary | ICD-10-CM | POA: Diagnosis not present

## 2023-04-24 DIAGNOSIS — I69351 Hemiplegia and hemiparesis following cerebral infarction affecting right dominant side: Secondary | ICD-10-CM | POA: Diagnosis not present

## 2023-04-24 DIAGNOSIS — Z4781 Encounter for orthopedic aftercare following surgical amputation: Secondary | ICD-10-CM | POA: Diagnosis not present

## 2023-04-24 DIAGNOSIS — Z89412 Acquired absence of left great toe: Secondary | ICD-10-CM | POA: Diagnosis not present

## 2023-04-24 DIAGNOSIS — Z7902 Long term (current) use of antithrombotics/antiplatelets: Secondary | ICD-10-CM | POA: Diagnosis not present

## 2023-04-28 ENCOUNTER — Encounter (INDEPENDENT_AMBULATORY_CARE_PROVIDER_SITE_OTHER): Payer: Self-pay | Admitting: Nurse Practitioner

## 2023-04-28 ENCOUNTER — Ambulatory Visit (INDEPENDENT_AMBULATORY_CARE_PROVIDER_SITE_OTHER): Admitting: Nurse Practitioner

## 2023-04-28 VITALS — BP 99/71 | HR 101 | Resp 18 | Ht 67.0 in | Wt 222.0 lb

## 2023-04-28 DIAGNOSIS — I739 Peripheral vascular disease, unspecified: Secondary | ICD-10-CM

## 2023-04-28 MED ORDER — MEDIHONEY WOUND/BURN DRESSING EX GEL: 1.0000 | Freq: Every day | CUTANEOUS | 0 refills | Status: AC

## 2023-04-29 ENCOUNTER — Other Ambulatory Visit: Payer: Self-pay

## 2023-04-29 DIAGNOSIS — Z7902 Long term (current) use of antithrombotics/antiplatelets: Secondary | ICD-10-CM | POA: Diagnosis not present

## 2023-04-29 DIAGNOSIS — Z556 Problems related to health literacy: Secondary | ICD-10-CM | POA: Diagnosis not present

## 2023-04-29 DIAGNOSIS — M86172 Other acute osteomyelitis, left ankle and foot: Secondary | ICD-10-CM | POA: Diagnosis not present

## 2023-04-29 DIAGNOSIS — Z48812 Encounter for surgical aftercare following surgery on the circulatory system: Secondary | ICD-10-CM | POA: Diagnosis not present

## 2023-04-29 DIAGNOSIS — Z89412 Acquired absence of left great toe: Secondary | ICD-10-CM | POA: Diagnosis not present

## 2023-04-29 DIAGNOSIS — E1169 Type 2 diabetes mellitus with other specified complication: Secondary | ICD-10-CM | POA: Diagnosis not present

## 2023-04-29 DIAGNOSIS — Z7985 Long-term (current) use of injectable non-insulin antidiabetic drugs: Secondary | ICD-10-CM | POA: Diagnosis not present

## 2023-04-29 DIAGNOSIS — E785 Hyperlipidemia, unspecified: Secondary | ICD-10-CM | POA: Diagnosis not present

## 2023-04-29 DIAGNOSIS — N200 Calculus of kidney: Secondary | ICD-10-CM | POA: Diagnosis not present

## 2023-04-29 DIAGNOSIS — Z7951 Long term (current) use of inhaled steroids: Secondary | ICD-10-CM | POA: Diagnosis not present

## 2023-04-29 DIAGNOSIS — J449 Chronic obstructive pulmonary disease, unspecified: Secondary | ICD-10-CM | POA: Diagnosis not present

## 2023-04-29 DIAGNOSIS — E1151 Type 2 diabetes mellitus with diabetic peripheral angiopathy without gangrene: Secondary | ICD-10-CM | POA: Diagnosis not present

## 2023-04-29 DIAGNOSIS — G43909 Migraine, unspecified, not intractable, without status migrainosus: Secondary | ICD-10-CM | POA: Diagnosis not present

## 2023-04-29 DIAGNOSIS — G8929 Other chronic pain: Secondary | ICD-10-CM | POA: Diagnosis not present

## 2023-04-29 DIAGNOSIS — I69351 Hemiplegia and hemiparesis following cerebral infarction affecting right dominant side: Secondary | ICD-10-CM | POA: Diagnosis not present

## 2023-04-29 DIAGNOSIS — I10 Essential (primary) hypertension: Secondary | ICD-10-CM | POA: Diagnosis not present

## 2023-04-29 DIAGNOSIS — Z4781 Encounter for orthopedic aftercare following surgical amputation: Secondary | ICD-10-CM | POA: Diagnosis not present

## 2023-04-29 DIAGNOSIS — F17200 Nicotine dependence, unspecified, uncomplicated: Secondary | ICD-10-CM | POA: Diagnosis not present

## 2023-04-29 NOTE — Patient Instructions (Signed)
 Visit Information  Thank you for taking time to visit with me today. Please don't hesitate to contact me if I can be of assistance to you before our next scheduled telephone appointment.  Our next appointment is by telephone on Wednesday April 23rd at 3:00pm  Following is a copy of your care plan:   Goals Addressed             This Visit's Progress    VBCI TOC Care Plan   On track    Current Barriers: ( Reviewed 04/29/23) Chronic Disease Management support and education needs related to CAD and DMII   RNCM Clinical Goal(s): ( Reviewed 04/29/23) Patient will work with the Care Management team over the next 30 days to address Transition of Care Barriers: Medication access Medication Management Diet/Nutrition/Food Resources Support at home Provider appointments Home Health services Functional/Safety verbalize understanding of plan for management of CAD and DMII as evidenced by No re-admissions to the hospital in 30 days  through collaboration with RN Care manager, provider, and care team.  Wound healing to opening where last staple was removed in two weeks Smoking Cessation  Interventions: ( Reviewed 04/29/23) Evaluation of current treatment plan related to  self management and patient's adherence to plan as established by provider   CAD Interventions: (Status:  Goal on track:  Yes.) Short Term Goal ( Reviewed 04/29/23) Assessed understanding of CAD diagnosis Medications reviewed including medications utilized in CAD treatment plan Provided education on Importance of limiting foods high in cholesterol Reviewed Importance of taking all medications as prescribed Reviewed Importance of attending all scheduled provider appointments Screening for signs and symptoms of depression related to chronic disease state ROM in the bed to improve circulation Work with HHPT to improve strength Cut back on eliminate Cigarettes Wear Nicotine patch   Diabetes Interventions: (Status:  Goal on  track:  Yes.) Short Term Goal ( Reviewed 04/29/23) Assessed patient's understanding of A1c goal: <6.5% Reviewed medications with patient and discussed importance of medication adherence Discussed plans with patient for ongoing care management follow up and provided patient with direct contact information for care management team Screening for signs and symptoms of depression related to chronic disease state  Daily monitoring of blood sugar Lab Results  Component Value Date   HGBA1C 5.2 05/06/2022   Observe extremities for non-healing wounds  - Currently with toe amputation. Keep dressing on incision and elevate foot while in bed - stitches removed 04/27/23. Healed per podiatry  Patient Goals/Self-Care Activities: ( Reviewed 04/29/23) Participate in Transition of Care Program/Attend [facility] scheduled calls Notify RN Care Manager of [facility] call rescheduling needs Take all medications as prescribed Attend all scheduled provider appointments Call pharmacy for medication refills 3-7 days in advance of running out of medications  Follow Up Plan:  Telephone follow up appointment with care management team member scheduled for:  Wednesday  April 23rd at 2:30pm         Patient verbalizes understanding of instructions and care plan provided today and agrees to view in MyChart. Active MyChart status and patient understanding of how to access instructions and care plan via MyChart confirmed with patient.     The patient has been provided with contact information for the care management team and has been advised to call with any health related questions or concerns.   Please call the care guide team at [phone] if you need to cancel or reschedule your appointment.   Please call the Suicide and Crisis Lifeline: 988 call the [place]  National Suicide Prevention Lifeline: 470-790-7418 or TTY: 716 229 6520 TTY [phone]) to talk to a trained counselor if you are experiencing a Mental Health or Behavioral  Health Crisis or need someone to talk to.  [name], BSN, RN [place]  VBCI - [employer] [facility] RN Care Manager [phone]

## 2023-04-29 NOTE — Transitions of Care (Post Inpatient/ED Visit) (Signed)
 Transition of Care week 4  Visit Note  04/29/2023  Name: [name] MRN: [id number]          DOB: 01/16/1965  Situation: Patient enrolled in [facility] 30-day program. Visit completed with Sigurd Driver by telephone.   Background:     Past Medical History:  Diagnosis Date   Anxiety    Cancer (HCC)    a spot on liver and treated    Complication of anesthesia    restless,easily upset   COPD (chronic obstructive pulmonary disease) (HCC)    Depression    Diabetes mellitus without complication (HCC)    Diverticulitis    Fatty liver    GERD (gastroesophageal reflux disease)    Headache(784.0)    migraines   History of kidney stones    Hyperlipidemia    Hypertension    PAD (peripheral artery disease) (HCC)    Pneumonia    Restless    Stroke (HCC)     Assessment: TOC Outreach completed today. The patient went to see her Podiatrist and the Vascular surgeon. Her stitches to the left great toe sight were removed and her incision has healed. The remaining staples were removed yesterday and there is an open wound at the end. There does not appear to be tunneling and she is now doing daily dressing changes with Medihoney. HHSN was at the house to today for initial assessment and will order supplies. She follows up with the vascular surgeon in two weeks. HHPT is supposed to start this week. The patient is not ambulatory and has not stood for about a year. Her goal is to be able to stand. She is either bed or wheelchair dependent. Her blood sugar yesterday was 114. She is unable to stand for accurate weight. She does continue to smoke and she was educated not to wear the ordered nicotine patch while smoking. She states her goal is to quit but her husband smokes and that makes it more difficult.   Patient Reported Symptoms: Cognitive Cognitive Status: Alert and oriented to person, place, and time      Neurological   Neurological Conditions:  (Neuropathy to feet) Neurological Management  Strategies: Medication therapy Neurological Self-Management Outcome: 3 (uncertain) Neurological Comment: Diabetic Neuropathy  HEENT HEENT Symptoms Reported: No symptoms reported      Cardiovascular Cardiovascular Symptoms Reported: No symptoms reported Does patient have uncontrolled Hypertension?: No Cardiovascular Conditions: Coronary artery disease Cardiovascular Management Strategies: Medication therapy Weight: 222 lb (100.7 kg) Cardiovascular Self-Management Outcome: 3 (uncertain) Cardiovascular Comment: Needs revascularization in the right leg  Respiratory Respiratory Symptoms Reported: No symptoms reported Additional Respiratory Details: Smoker Respiratory Self-Management Outcome: 4 (good) Respiratory Comment: No concerns. Wants to quit smoking  Endocrine Patient reports the following symptoms related to hypoglycemia or hyperglycemia : No symptoms reported Is patient diabetic?: Yes Is patient checking blood sugars at home?: Yes Endocrine Conditions: Diabetes Endocrine Management Strategies: Medication therapy, Routine screening Endocrine Self-Management Outcome: 4 (good) Endocrine Comment: Stable  Gastrointestinal Gastrointestinal Symptoms Reported: Incontinence Gastrointestinal Self-Management Outcome: 4 (good) Gastrointestinal Comment: Bedbound. Cannot use toilet Nutrition Risk Screen (CP): No indicators present  Genitourinary   Genitourinary Conditions: Incontinence Genitourinary Self-Management Outcome: 4 (good) Genitourinary Comment: Bedbound. Cannot use toilet  Integumentary Integumentary Symptoms Reported: Incision, Wound Additional Integumentary Details: New wound to end of incision from staple removal Skin Conditions: Wound Skin Management Strategies: Medication therapy Skin Self-Management Outcome: 3 (uncertain) Skin Comment: Using Medihoney and dry gauze for dressing change. Has HHSN weekly  Musculoskeletal Musculoskelatal Symptoms Reviewed:  Muscle  pain Additional Musculoskeletal Details: non ambulatory. Chronic pain for 10 years Musculoskeletal Conditions: Fibromyalgia, Amputation, Mobility limited Musculoskeletal Management Strategies: Medication therapy Musculoskeletal Self-Management Outcome: 3 (uncertain) Musculoskeletal Comment: Bedbound. Does not stand Falls in the past year?: Yes Number of falls in past year: 1 or less Was there an injury with Fall?: No Fall Risk Category Calculator: 1 Patient Fall Risk Level: Low Fall Risk Patient at Risk for Falls Due to: Other (Comment) (Has to use a Hoyer lift for transfers) Fall risk Follow up: Falls prevention discussed  Psychosocial Psychosocial Symptoms Reported: No symptoms reported Additional Psychological Details: The patient states she is on medication for Depression Behavioral Health Conditions: Depression Behavioral Management Strategies: Medication therapy Behavioral Health Self-Management Outcome: 4 (good) Behavioral Health Comment: Stable Major Change/Loss/Stressor/Fears (CP): Denies Techniques to Cope with Loss/Stress/Change: Spiritual practice(s) Do you feel physically threatened by others?: No   There were no vitals filed for this visit.  Medications Reviewed Today     Reviewed by Redge Gainer, RN (Case Manager) on 04/29/23 at 1416  Med List Status: <None>   Medication Order Taking? Sig Documenting Provider Last Dose Status Informant  albuterol (VENTOLIN HFA) 108 (90 Base) MCG/ACT inhaler [id number]  INHALE 2 INHALATIONS BY MOUTH  EVERY 4 HOURS AS NEEDED FOR  WHEEZING OR FOR SHORTNESS OF  BREATH Karamalegos, Netta Neat, DO  Active Self           Med Note Clarene Reamer, MARIA   Wed Mar 25, 2023  8:28 PM) prn  aspirin EC 81 MG tablet [id number]  Take 1 tablet (81 mg total) by mouth daily at 6 (six) AM. Swallow whole. Charise Killian, MD  Active   atorvastatin (LIPITOR) 40 MG tablet [id number]  TAKE 1 TABLET BY MOUTH AT  BEDTIME Smitty Cords, DO  Active  Self  azelastine (ASTELIN) 0.1 % nasal spray [id number]  Place 2 sprays into both nostrils 2 (two) times daily. Smitty Cords, DO  Active Pharmacy Records, Self  buPROPion (WELLBUTRIN XL) 150 MG 24 hr tablet [id number]  Take 1 tablet (150 mg total) by mouth daily. Smitty Cords, DO  Active Self  celecoxib (CELEBREX) 100 MG capsule [id number]  Take 100 mg by mouth daily. [provider]  Active   ciprofloxacin (CIPRO) 500 MG tablet [id number] No Take 500 mg by mouth 2 (two) times daily.  Patient not taking: Reported on 04/29/2023   [provider] Not Taking Active   citalopram (CELEXA) 40 MG tablet [id number]  TAKE 1 TABLET BY MOUTH DAILY Smitty Cords, DO  Active Self  clopidogrel (PLAVIX) 75 MG tablet [id number]  TAKE ONE TABLET BY MOUTH DAILY AT [place] Score, Netta Neat, DO  Active Self  cyclobenzaprine (FLEXERIL) 10 MG tablet [id number]  Take 10 mg by mouth 3 (three) times daily. [provider]  Active Self  doxycycline (MONODOX) 100 MG capsule [id number]  Take 100 mg by mouth 2 (two) times daily. [provider]  Active   fluconazole (DIFLUCAN) 150 MG tablet [id number]  Take one tablet by mouth on Day 1. Repeat dose 2nd tablet on Day 3. Karamalegos, Netta Neat, DO  Active   fluticasone (FLONASE) 50 MCG/ACT nasal spray [id number]  Place 2 sprays into both nostrils daily. Use for 4-6 weeks then stop and use seasonally or as needed. Smitty Cords, DO  Active Self  gabapentin (NEURONTIN) 600 MG tablet [id number]  TAKE 1 TABLET BY MOUTH 4 TIMES  DAILY Althea Charon Netta Neat, DO  Active Self  leptospermum manuka honey (MEDIHONEY) gel [id number]  Apply 1 Application topically daily. Georgiana Spinner, NP  Active   metFORMIN (GLUCOPHAGE) 1000 MG tablet [id number]  TAKE 1 TABLET BY MOUTH TWICE  DAILY WITH MEALS  Patient not taking: Reported on 03/02/2023   Smitty Cords, DO  Active Self  methocarbamol (ROBAXIN) 500 MG  tablet [id number]  Take 500 mg by mouth 3 (three) times daily. [provider]  Active   midodrine (PROAMATINE) 5 MG tablet [id number]  Take 1 tablet (5 mg total) by mouth 3 (three) times daily with meals. Debbe Odea, MD  Active Self  MOUNJARO 10 MG/0.5ML Pen [id number]  INJECT SUBCUTANEOUSLY 10 MG ONCE WEEKLY Smitty Cords, DO  Active Self  mupirocin ointment (BACTROBAN) 2 % [id number]  Apply 1 Application topically 2 (two) times daily. For 10 days Smitty Cords, DO  Active Self           Med Note Maisie Fus, Jolly Mango   Wed Mar 25, 2023  9:08 PM) Patient uses as needed  nicotine (NICODERM CQ - DOSED IN MG/24 HOURS) 14 mg/24hr patch [id number] No Place onto the skin.  Patient not taking: Reported on 04/29/2023   [provider] Not Taking Active   nystatin (MYCOSTATIN/NYSTOP) powder [id number]  APPLY 1 APPLICATION TOPICALLY THREE TIMES DAILY AS NEEDED FOR YEAST RASH INTERTRIGO Althea Charon Netta Neat, DO  Active Self           Med Note Sharia Reeve   Wed Mar 25, 2023  9:09 PM) prn  ondansetron (ZOFRAN-ODT) 4 MG disintegrating tablet [id number]  DISSOLVE 1 TABLET ON THE TONGUE EVERY 8 HOURS AS NEEDED FOR NAUSEA AND VOMITING Smitty Cords, DO  Active   oxyCODONE-acetaminophen (PERCOCET/ROXICET) 5-325 MG tablet [id number]  Take 2 tablets by mouth every 6 (six) hours as needed for moderate pain (pain score 4-6). Marcie Bal, NP  Active            Med Note Sharon Seller, CRYSTAL L   Mon Apr 06, 2023  2:58 PM) Uses 10 mg/650 Poor control   pantoprazole (PROTONIX) 40 MG tablet [id number]  TAKE 1 TABLET BY MOUTH DAILY AT  9 AM Karamalegos, Netta Neat, DO  Active   QUEtiapine (SEROQUEL) 100 MG tablet [id number]  TAKE 1 TABLET BY MOUTH DAILY AT  9 PM AT BEDTIME Smitty Cords, DO  Active Self  [facility] syringe [id number]   [provider]  Active   topiramate (TOPAMAX) 100 MG tablet [id number]  TAKE 1 TABLET BY MOUTH TWICE  DAILY  Smitty Cords, DO  Active Self  TRELEGY ELLIPTA 100-62.5-25 MCG/ACT AEPB [id number]  USE 1 INHALATION BY MOUTH ONCE  DAILY AT THE SAME TIME EACH DAY Karamalegos, Netta Neat, DO  Active Self  trimethoprim-polymyxin b (POLYTRIM) ophthalmic solution [id number]  Place 1 drop into both eyes 4 (four) times daily. For 7-10 days Smitty Cords, DO  Active Self           Med Note Sharia Reeve   Wed Mar 25, 2023  9:16 PM) Patient uses as needed  VITAMIN D PO [id number]  Take by mouth. [provider]  Active Self           Med Note Clarene Reamer, MARIA   Wed Mar 25, 2023  8:30 PM) Every wed.  Vitamin D, Ergocalciferol, (DRISDOL) 1.25 MG (50000 UNIT) CAPS capsule [id number]  TAKE 1 CAPSULE BY MOUTH EVERY WEDNESDAY @9AM  Smitty Cords, DO  Active Self  Recommendation:   Specialty provider follow-up 05/13/23 Daily dressing changes to left groin with Medihoney and dry gauze Continue to monitor blood glucose daily Reduce the number of cigarettes smoked in a day Report increased drainage, redness, pain to left incision site  Follow Up Plan:   Telephone follow-up in 1 week  Gareld June, BSN, RN   VBCI - [facility] RN Care Manager [phone]

## 2023-04-29 NOTE — Progress Notes (Signed)
 Subjective:    Patient ID: [name], female    DOB: April 10, 1965, 58 y.o.   MRN: [id number] Chief Complaint  Patient presents with   Follow-up    fu 1 week staple removal see FB- pt has to have afternoon    The patient underwent surgery on 04/01/2023, including:  PROCEDURE: 1.   Left common femoral artery endarterectomy and patch angioplasty 2.   Left lower extremity angiogram 3.   Stent placement to left popliteal artery with 5 mm x 6 cm Lifestent 4.   Stent placement to the proximal left SFA with 7 mm x 2.5 cm Viabahn stent   Today the patient is doing well post surgery.  All staples were removed.  She has a small pinpoint hole right in her groin area.  There is no tunneling or tracking.  She also underwent to amputation and per the patient this is also healing well.  It is currently followed by podiatry.    Review of Systems  Skin:  Positive for wound.  Neurological:  Positive for weakness.  All other systems reviewed and are negative.      Objective:   Physical Exam Vitals reviewed.  HENT:     Head: Normocephalic.  Cardiovascular:     Rate and Rhythm: Normal rate.  Pulmonary:     Effort: Pulmonary effort is normal.  Skin:    General: Skin is warm and dry.  Neurological:     Mental Status: She is alert and oriented to person, place, and time.  Psychiatric:        Mood and Affect: Mood normal.        Behavior: Behavior normal.        Thought Content: Thought content normal.        Judgment: Judgment normal.     BP 99/71   Pulse (!) 101   Resp 18   Ht 5\' 7"  (1.702 m)   Wt 222 lb (100.7 kg)   BMI 34.77 kg/m   Past Medical History:  Diagnosis Date   Anxiety    Cancer (HCC)    a spot on liver and treated    Complication of anesthesia    restless,easily upset   COPD (chronic obstructive pulmonary disease) (HCC)    Depression    Diabetes mellitus without complication (HCC)    Diverticulitis    Fatty liver    GERD (gastroesophageal reflux disease)     Headache(784.0)    migraines   History of kidney stones    Hyperlipidemia    Hypertension    PAD (peripheral artery disease) (HCC)    Pneumonia    Restless    Stroke [facility])     Social History   Socioeconomic History   Marital status: Married    Spouse name: Not on file   Number of children: Not on file   Years of education: Not on file   Highest education level: 10th grade  Occupational History   Not on file  Tobacco Use   Smoking status: Every Day    Current packs/day: 1.00    Average packs/day: 1 pack/day for 20.0 years (20.0 ttl pk-yrs)    Types: Cigarettes   Smokeless tobacco: Never  Vaping Use   Vaping status: Never Used  Substance and Sexual Activity   Alcohol use: No   Drug use: Yes    Types: Marijuana, Oxycodone    Comment: last smoked 2 days ago  8/4   Sexual activity: Not on file  Other Topics Concern   Not on file  Social History Narrative   Not on file   Social Drivers of Health   Financial Resource Strain: Low Risk  (11/19/2022)   Overall Financial Resource Strain (CARDIA)    Difficulty of Paying Living Expenses: Not very hard  Food Insecurity: No Food Insecurity (04/15/2023)   Hunger Vital Sign    Worried About Running Out of Food in the Last Year: Never true    Ran Out of Food in the Last Year: Never true  Transportation Needs: No Transportation Needs (04/15/2023)   PRAPARE - Administrator, Civil Service (Medical): No    Lack of Transportation (Non-Medical): No  Physical Activity: Inactive (11/19/2022)   Exercise Vital Sign    Days of Exercise per Week: 0 days    Minutes of Exercise per Session: 0 min  Stress: Stress Concern Present (11/19/2022)   [employer] of Occupational Health - Occupational Stress Questionnaire    Feeling of Stress : To some extent  Social Connections: Moderately Isolated (03/26/2023)   Social Connection and Isolation Panel [NHANES]    Frequency of Communication with Friends and Family: More than three  times a week    Frequency of Social Gatherings with Friends and Family: Patient declined    Attends Religious Services: Never    Database administrator or Organizations: No    Attends Banker Meetings: Never    Marital Status: Married  Catering manager Violence: Not At Risk (04/15/2023)   Humiliation, Afraid, Rape, and Kick questionnaire    Fear of Current or Ex-Partner: No    Emotionally Abused: No    Physically Abused: No    Sexually Abused: No    Past Surgical History:  Procedure Laterality Date   ABDOMINAL HYSTERECTOMY     ACHILLES TENDON LENGTHENING Right 08/18/2017   Procedure: ACHILLES TENDON LENGTHENING;  Surgeon: Hardy Lia, MD;  Location: [facility] OR;  Service: Orthopedics;  Laterality: Right;   AMPUTATION TOE Left 04/02/2023   Procedure: AMPUTATION, TOE;  Surgeon: Evertt Hoe, DPM;  Location: [facility] ORS;  Service: Orthopedics/Podiatry;  Laterality: Left;  LEFT GREAT TOE   ANTERIOR CERVICAL DECOMP/DISCECTOMY FUSION N/A 03/11/2012   Procedure: ANTERIOR CERVICAL DECOMPRESSION/DISCECTOMY FUSION 1 LEVEL;  Surgeon: Elder Greening, MD;  Location: [facility];  Service: Neurosurgery;  Laterality: N/A;  Cervical five-six anterior cervical decompression with fusion interbody prothesis plating and bonegraft   BACK SURGERY     ENDARTERECTOMY FEMORAL Left 04/01/2023   Procedure: ENDARTERECTOMY, FEMORAL;  Surgeon: Jackquelyn Mass, MD;  Location: [facility] ORS;  Service: Vascular;  Laterality: Left;  with Popiteal stent placment   EXTERNAL FIXATION LEG Right 08/10/2017   Procedure: EXTERNAL FIXATION ANKLE;  Surgeon: Arlyne Lame, MD;  Location: [facility] ORS;  Service: Orthopedics;  Laterality: Right;   EXTERNAL FIXATION REMOVAL Right 08/18/2017   Procedure: REMOVAL EXTERNAL FIXATION LEG;  Surgeon: Hardy Lia, MD;  Location: [facility] OR;  Service: Orthopedics;  Laterality: Right;   EYE SURGERY     HERNIA REPAIR     IR FL GUIDED LOC OF NEEDLE/CATH TIP FOR SPINAL INJECTION LT   12/09/2021   IR FL GUIDED LOC OF NEEDLE/CATH TIP FOR SPINAL INJECTION RT  12/09/2021   JOINT REPLACEMENT     bil knees   LOWER EXTREMITY ANGIOGRAPHY Left 03/27/2023   Procedure: Lower Extremity Angiography;  Surgeon: Jackquelyn Mass, MD;  Location: [facility] INVASIVE CV [facility];  Service: Cardiovascular;  Laterality: Left;   ORIF ANKLE  FRACTURE Right 08/18/2017   Procedure: OPEN REDUCTION INTERNAL FIXATION (ORIF) ANKLE FRACTURE;  Surgeon: Hardy Lia, MD;  Location: [facility] OR;  Service: Orthopedics;  Laterality: Right;   REPLACEMENT TOTAL KNEE BILATERAL      Family History  Problem Relation Age of Onset   Diabetes Mother    Hypertension Mother    Hypertension Father     Allergies  Allergen Reactions   Tramadol Nausea And Vomiting and Hypertension   Augmentin [Amoxicillin-Pot Clavulanate] Rash    Tolerated Zosyn 03/26/23       Latest Ref Rng & Units 04/10/2023    3:00 PM 04/02/2023    1:15 PM 04/01/2023   11:36 PM  CBC  WBC 4.0 - 10.5 K/uL 12.6  14.3  10.7   Hemoglobin 12.0 - 15.0 g/dL 96.2  95.2  84.1   Hematocrit 36.0 - 46.0 % 41.9  37.0  35.2   Platelets 150 - 400 K/uL 510  294  287       CMP     Component Value Date/Time   NA 139 04/10/2023 1500   NA 135 (L) 09/22/2013 2251   K 4.1 04/10/2023 1500   K 3.8 09/22/2013 2251   CL 105 04/10/2023 1500   CL 100 09/22/2013 2251   CO2 19 (L) 04/10/2023 1500   CO2 27 09/22/2013 2251   GLUCOSE 158 (H) 04/10/2023 1500   GLUCOSE 335 (H) 09/22/2013 2251   BUN 11 04/10/2023 1500   BUN 11 09/22/2013 2251   CREATININE 0.71 04/10/2023 1500   CREATININE 0.62 05/15/2021 1351   CALCIUM 9.3 04/10/2023 1500   CALCIUM 8.3 (L) 09/22/2013 2251   PROT 7.6 04/10/2023 1500   PROT 7.2 09/22/2013 2251   ALBUMIN 3.5 04/10/2023 1500   ALBUMIN 2.6 (L) 09/22/2013 2251   AST 14 (L) 04/10/2023 1500   AST 17 09/22/2013 2251   ALT 12 04/10/2023 1500   ALT 16 09/22/2013 2251   ALKPHOS 65 04/10/2023 1500   ALKPHOS 78 09/22/2013 2251   BILITOT 0.7  04/10/2023 1500   BILITOT 0.2 09/22/2013 2251   GFR 91.12 09/23/2017 1627   EGFR 104 05/15/2021 1351   GFRNONAA >60 04/10/2023 1500   GFRNONAA >60 09/22/2013 2251     No results found.     Assessment & Plan:   1. PAD (peripheral artery disease) (HCC) (Primary) Remaining staples removed today.  She has a small opening we will have the patient utilize Medihoney with dry gauze to help cover the wound.  Will have her return in 2 to 3 weeks for wound evaluation.  In addition the patient supposed to have home health we will try to reach out to facilitate this as well.   Current Outpatient Medications on File Prior to Visit  Medication Sig Dispense Refill   albuterol (VENTOLIN HFA) 108 (90 Base) MCG/ACT inhaler INHALE 2 INHALATIONS BY MOUTH  EVERY 4 HOURS AS NEEDED FOR  WHEEZING OR FOR SHORTNESS OF  BREATH 51 g 2   aspirin EC 81 MG tablet Take 1 tablet (81 mg total) by mouth daily at 6 (six) AM. Swallow whole. 30 tablet 0   atorvastatin (LIPITOR) 40 MG tablet TAKE 1 TABLET BY MOUTH AT  BEDTIME 90 tablet 3   azelastine (ASTELIN) 0.1 % nasal spray Place 2 sprays into both nostrils 2 (two) times daily. 30 mL 12   buPROPion (WELLBUTRIN XL) 150 MG 24 hr tablet Take 1 tablet (150 mg total) by mouth daily. 100 tablet 1   celecoxib (CELEBREX)  100 MG capsule Take 100 mg by mouth daily.     ciprofloxacin (CIPRO) 500 MG tablet Take 500 mg by mouth 2 (two) times daily.     citalopram (CELEXA) 40 MG tablet TAKE 1 TABLET BY MOUTH DAILY 100 tablet 1   clopidogrel (PLAVIX) 75 MG tablet TAKE ONE TABLET BY MOUTH DAILY AT 9AM 100 tablet 11   cyclobenzaprine (FLEXERIL) 10 MG tablet Take 10 mg by mouth 3 (three) times daily.     doxycycline (MONODOX) 100 MG capsule Take 100 mg by mouth 2 (two) times daily.     fluconazole (DIFLUCAN) 150 MG tablet Take one tablet by mouth on Day 1. Repeat dose 2nd tablet on Day 3. 2 tablet 0   fluticasone (FLONASE) 50 MCG/ACT nasal spray Place 2 sprays into both nostrils daily.  Use for 4-6 weeks then stop and use seasonally or as needed. 16 g 0   gabapentin (NEURONTIN) 600 MG tablet TAKE 1 TABLET BY MOUTH 4 TIMES  DAILY 400 tablet 2   methocarbamol (ROBAXIN) 500 MG tablet Take 500 mg by mouth 3 (three) times daily.     midodrine (PROAMATINE) 5 MG tablet Take 1 tablet (5 mg total) by mouth 3 (three) times daily with meals. 21 tablet 0   MOUNJARO 10 MG/0.5ML Pen INJECT SUBCUTANEOUSLY 10 MG ONCE WEEKLY 6 mL 3   mupirocin ointment (BACTROBAN) 2 % Apply 1 Application topically 2 (two) times daily. For 10 days 22 g 0   nicotine (NICODERM CQ - DOSED IN MG/24 HOURS) 14 mg/24hr patch Place onto the skin.     nystatin (MYCOSTATIN/NYSTOP) powder APPLY 1 APPLICATION TOPICALLY THREE TIMES DAILY AS NEEDED FOR YEAST RASH INTERTRIGO 30 g 11   ondansetron (ZOFRAN-ODT) 4 MG disintegrating tablet DISSOLVE 1 TABLET ON THE TONGUE EVERY 8 HOURS AS NEEDED FOR NAUSEA AND VOMITING 30 tablet 2   oxyCODONE-acetaminophen (PERCOCET/ROXICET) 5-325 MG tablet Take 2 tablets by mouth every 6 (six) hours as needed for moderate pain (pain score 4-6). 20 tablet 0   pantoprazole (PROTONIX) 40 MG tablet TAKE 1 TABLET BY MOUTH DAILY AT  9 AM 100 tablet 0   QUEtiapine (SEROQUEL) 100 MG tablet TAKE 1 TABLET BY MOUTH DAILY AT  9 PM AT BEDTIME 100 tablet 2   SPIKEVAX syringe      topiramate (TOPAMAX) 100 MG tablet TAKE 1 TABLET BY MOUTH TWICE  DAILY 200 tablet 2   TRELEGY ELLIPTA 100-62.5-25 MCG/ACT AEPB USE 1 INHALATION BY MOUTH ONCE  DAILY AT THE SAME TIME EACH DAY 180 each 3   trimethoprim-polymyxin b (POLYTRIM) ophthalmic solution Place 1 drop into both eyes 4 (four) times daily. For 7-10 days 10 mL 0   VITAMIN D PO Take by mouth.     Vitamin D, Ergocalciferol, (DRISDOL) 1.25 MG (50000 UNIT) CAPS capsule TAKE 1 CAPSULE BY MOUTH EVERY WEDNESDAY @9AM  4 capsule 11   metFORMIN (GLUCOPHAGE) 1000 MG tablet TAKE 1 TABLET BY MOUTH TWICE  DAILY WITH MEALS (Patient not taking: Reported on 03/02/2023) 200 tablet 2   No  current facility-administered medications on file prior to visit.    There are no Patient Instructions on file for this visit. No follow-ups on file.   Marely Apgar E Freeda Spivey, NP

## 2023-05-04 ENCOUNTER — Telehealth: Payer: Self-pay

## 2023-05-04 DIAGNOSIS — E785 Hyperlipidemia, unspecified: Secondary | ICD-10-CM | POA: Diagnosis not present

## 2023-05-04 DIAGNOSIS — J449 Chronic obstructive pulmonary disease, unspecified: Secondary | ICD-10-CM | POA: Diagnosis not present

## 2023-05-04 DIAGNOSIS — G43909 Migraine, unspecified, not intractable, without status migrainosus: Secondary | ICD-10-CM | POA: Diagnosis not present

## 2023-05-04 DIAGNOSIS — Z7985 Long-term (current) use of injectable non-insulin antidiabetic drugs: Secondary | ICD-10-CM | POA: Diagnosis not present

## 2023-05-04 DIAGNOSIS — I10 Essential (primary) hypertension: Secondary | ICD-10-CM | POA: Diagnosis not present

## 2023-05-04 DIAGNOSIS — E1151 Type 2 diabetes mellitus with diabetic peripheral angiopathy without gangrene: Secondary | ICD-10-CM | POA: Diagnosis not present

## 2023-05-04 DIAGNOSIS — Z4781 Encounter for orthopedic aftercare following surgical amputation: Secondary | ICD-10-CM | POA: Diagnosis not present

## 2023-05-04 DIAGNOSIS — Z556 Problems related to health literacy: Secondary | ICD-10-CM | POA: Diagnosis not present

## 2023-05-04 DIAGNOSIS — Z89412 Acquired absence of left great toe: Secondary | ICD-10-CM | POA: Diagnosis not present

## 2023-05-04 DIAGNOSIS — M86172 Other acute osteomyelitis, left ankle and foot: Secondary | ICD-10-CM | POA: Diagnosis not present

## 2023-05-04 DIAGNOSIS — Z7951 Long term (current) use of inhaled steroids: Secondary | ICD-10-CM | POA: Diagnosis not present

## 2023-05-04 DIAGNOSIS — N200 Calculus of kidney: Secondary | ICD-10-CM | POA: Diagnosis not present

## 2023-05-04 DIAGNOSIS — G8929 Other chronic pain: Secondary | ICD-10-CM | POA: Diagnosis not present

## 2023-05-04 DIAGNOSIS — F17200 Nicotine dependence, unspecified, uncomplicated: Secondary | ICD-10-CM | POA: Diagnosis not present

## 2023-05-04 DIAGNOSIS — Z7902 Long term (current) use of antithrombotics/antiplatelets: Secondary | ICD-10-CM | POA: Diagnosis not present

## 2023-05-04 DIAGNOSIS — Z48812 Encounter for surgical aftercare following surgery on the circulatory system: Secondary | ICD-10-CM | POA: Diagnosis not present

## 2023-05-04 DIAGNOSIS — I69351 Hemiplegia and hemiparesis following cerebral infarction affecting right dominant side: Secondary | ICD-10-CM | POA: Diagnosis not present

## 2023-05-04 DIAGNOSIS — E1169 Type 2 diabetes mellitus with other specified complication: Secondary | ICD-10-CM | POA: Diagnosis not present

## 2023-05-04 NOTE — Telephone Encounter (Signed)
 That is fine

## 2023-05-04 NOTE — Telephone Encounter (Signed)
 Okay to proceed with PT verbal orders  Domingo Friend, DO [facility] 05/04/2023, 3:50 PM

## 2023-05-04 NOTE — Telephone Encounter (Signed)
Verbal orders given to [name].  

## 2023-05-04 NOTE — Telephone Encounter (Signed)
 Copied from CRM [id number]. Topic: Clinical - Home Health Verbal Orders >> May 04, 2023  3:04 PM Lotus Round B wrote: Caller/Agency: [name] from [facility]  Callback Number: [phone] option 2  Service Requested: Physical Therapy Frequency: evaluation  Any new concerns about the patient? Yes / tiffany stated that the pt wakes up late or sometimes just doesn't want to do PT . She said she just needs a verbal order to get her evaluated .

## 2023-05-05 ENCOUNTER — Telehealth: Payer: Self-pay

## 2023-05-05 DIAGNOSIS — M86172 Other acute osteomyelitis, left ankle and foot: Secondary | ICD-10-CM | POA: Diagnosis not present

## 2023-05-05 DIAGNOSIS — I10 Essential (primary) hypertension: Secondary | ICD-10-CM | POA: Diagnosis not present

## 2023-05-05 DIAGNOSIS — Z48812 Encounter for surgical aftercare following surgery on the circulatory system: Secondary | ICD-10-CM | POA: Diagnosis not present

## 2023-05-05 DIAGNOSIS — J449 Chronic obstructive pulmonary disease, unspecified: Secondary | ICD-10-CM | POA: Diagnosis not present

## 2023-05-05 DIAGNOSIS — G43909 Migraine, unspecified, not intractable, without status migrainosus: Secondary | ICD-10-CM | POA: Diagnosis not present

## 2023-05-05 DIAGNOSIS — Z7902 Long term (current) use of antithrombotics/antiplatelets: Secondary | ICD-10-CM | POA: Diagnosis not present

## 2023-05-05 DIAGNOSIS — G8929 Other chronic pain: Secondary | ICD-10-CM | POA: Diagnosis not present

## 2023-05-05 DIAGNOSIS — Z4781 Encounter for orthopedic aftercare following surgical amputation: Secondary | ICD-10-CM | POA: Diagnosis not present

## 2023-05-05 DIAGNOSIS — E1169 Type 2 diabetes mellitus with other specified complication: Secondary | ICD-10-CM | POA: Diagnosis not present

## 2023-05-05 DIAGNOSIS — Z7985 Long-term (current) use of injectable non-insulin antidiabetic drugs: Secondary | ICD-10-CM | POA: Diagnosis not present

## 2023-05-05 DIAGNOSIS — I69351 Hemiplegia and hemiparesis following cerebral infarction affecting right dominant side: Secondary | ICD-10-CM | POA: Diagnosis not present

## 2023-05-05 DIAGNOSIS — E1151 Type 2 diabetes mellitus with diabetic peripheral angiopathy without gangrene: Secondary | ICD-10-CM | POA: Diagnosis not present

## 2023-05-05 DIAGNOSIS — N200 Calculus of kidney: Secondary | ICD-10-CM | POA: Diagnosis not present

## 2023-05-05 DIAGNOSIS — Z7951 Long term (current) use of inhaled steroids: Secondary | ICD-10-CM | POA: Diagnosis not present

## 2023-05-05 DIAGNOSIS — Z89412 Acquired absence of left great toe: Secondary | ICD-10-CM | POA: Diagnosis not present

## 2023-05-05 DIAGNOSIS — F17200 Nicotine dependence, unspecified, uncomplicated: Secondary | ICD-10-CM | POA: Diagnosis not present

## 2023-05-05 DIAGNOSIS — E785 Hyperlipidemia, unspecified: Secondary | ICD-10-CM | POA: Diagnosis not present

## 2023-05-05 DIAGNOSIS — Z556 Problems related to health literacy: Secondary | ICD-10-CM | POA: Diagnosis not present

## 2023-05-05 NOTE — Telephone Encounter (Signed)
Verbals given to Chris.  ?

## 2023-05-05 NOTE — Telephone Encounter (Signed)
 Yes okay to proceed w/ verbal orders  Domingo Friend, DO [facility] 05/05/2023, 11:23 AM

## 2023-05-05 NOTE — Telephone Encounter (Signed)
 LVM letting pt know she can use the heating pad to the thigh and groin areas.

## 2023-05-05 NOTE — Telephone Encounter (Signed)
 Copied from CRM [id number]. Topic: General - Other >> May 05, 2023 11:15 AM Marissa P wrote: Reason for CRM: Larinda Plover from home healthcare is needing a callback for this patient # [phone]. Orders needed for this patient. Physical Therapy one time a week for six weeks.

## 2023-05-06 ENCOUNTER — Telehealth: Payer: Self-pay

## 2023-05-06 DIAGNOSIS — M542 Cervicalgia: Secondary | ICD-10-CM | POA: Diagnosis not present

## 2023-05-06 DIAGNOSIS — E7800 Pure hypercholesterolemia, unspecified: Secondary | ICD-10-CM | POA: Diagnosis not present

## 2023-05-06 DIAGNOSIS — G8929 Other chronic pain: Secondary | ICD-10-CM | POA: Diagnosis not present

## 2023-05-06 DIAGNOSIS — Z9181 History of falling: Secondary | ICD-10-CM | POA: Diagnosis not present

## 2023-05-06 DIAGNOSIS — Z79899 Other long term (current) drug therapy: Secondary | ICD-10-CM | POA: Diagnosis not present

## 2023-05-06 DIAGNOSIS — F17210 Nicotine dependence, cigarettes, uncomplicated: Secondary | ICD-10-CM | POA: Diagnosis not present

## 2023-05-06 DIAGNOSIS — M25562 Pain in left knee: Secondary | ICD-10-CM | POA: Diagnosis not present

## 2023-05-06 DIAGNOSIS — M5416 Radiculopathy, lumbar region: Secondary | ICD-10-CM | POA: Diagnosis not present

## 2023-05-06 DIAGNOSIS — Z122 Encounter for screening for malignant neoplasm of respiratory organs: Secondary | ICD-10-CM | POA: Diagnosis not present

## 2023-05-06 DIAGNOSIS — R030 Elevated blood-pressure reading, without diagnosis of hypertension: Secondary | ICD-10-CM | POA: Diagnosis not present

## 2023-05-06 DIAGNOSIS — M25571 Pain in right ankle and joints of right foot: Secondary | ICD-10-CM | POA: Diagnosis not present

## 2023-05-06 DIAGNOSIS — M25561 Pain in right knee: Secondary | ICD-10-CM | POA: Diagnosis not present

## 2023-05-06 NOTE — Transitions of Care (Post Inpatient/ED Visit) (Unsigned)
 Care Management  Transitions of Care Program Transitions of Care Post-discharge week 5  05/06/2023 Name: [name] MRN: [id number] DOB: 11-22-1965  Subjective: [name] is a 58 y.o. year old female who is a primary care patient of Raina Bunting, DO. The Care Management team was unable to reach the patient by phone to assess and address transitions of care needs.   Plan: Additional outreach attempts will be made to reach the patient enrolled in the [facility] Program (Post Inpatient/ED Visit).  Gareld June, BSN, RN [place]  VBCI - [employer] [facility] RN Care Manager [phone]

## 2023-05-07 ENCOUNTER — Other Ambulatory Visit: Payer: Self-pay

## 2023-05-07 ENCOUNTER — Telehealth: Payer: Self-pay

## 2023-05-07 DIAGNOSIS — Z556 Problems related to health literacy: Secondary | ICD-10-CM | POA: Diagnosis not present

## 2023-05-07 DIAGNOSIS — I69351 Hemiplegia and hemiparesis following cerebral infarction affecting right dominant side: Secondary | ICD-10-CM | POA: Diagnosis not present

## 2023-05-07 DIAGNOSIS — I10 Essential (primary) hypertension: Secondary | ICD-10-CM | POA: Diagnosis not present

## 2023-05-07 DIAGNOSIS — Z122 Encounter for screening for malignant neoplasm of respiratory organs: Secondary | ICD-10-CM

## 2023-05-07 DIAGNOSIS — G43909 Migraine, unspecified, not intractable, without status migrainosus: Secondary | ICD-10-CM | POA: Diagnosis not present

## 2023-05-07 DIAGNOSIS — Z4781 Encounter for orthopedic aftercare following surgical amputation: Secondary | ICD-10-CM | POA: Diagnosis not present

## 2023-05-07 DIAGNOSIS — M86172 Other acute osteomyelitis, left ankle and foot: Secondary | ICD-10-CM | POA: Diagnosis not present

## 2023-05-07 DIAGNOSIS — Z89412 Acquired absence of left great toe: Secondary | ICD-10-CM | POA: Diagnosis not present

## 2023-05-07 DIAGNOSIS — Z48812 Encounter for surgical aftercare following surgery on the circulatory system: Secondary | ICD-10-CM | POA: Diagnosis not present

## 2023-05-07 DIAGNOSIS — N200 Calculus of kidney: Secondary | ICD-10-CM | POA: Diagnosis not present

## 2023-05-07 DIAGNOSIS — J449 Chronic obstructive pulmonary disease, unspecified: Secondary | ICD-10-CM | POA: Diagnosis not present

## 2023-05-07 DIAGNOSIS — E1151 Type 2 diabetes mellitus with diabetic peripheral angiopathy without gangrene: Secondary | ICD-10-CM | POA: Diagnosis not present

## 2023-05-07 DIAGNOSIS — E785 Hyperlipidemia, unspecified: Secondary | ICD-10-CM | POA: Diagnosis not present

## 2023-05-07 DIAGNOSIS — E1169 Type 2 diabetes mellitus with other specified complication: Secondary | ICD-10-CM | POA: Diagnosis not present

## 2023-05-07 DIAGNOSIS — Z7985 Long-term (current) use of injectable non-insulin antidiabetic drugs: Secondary | ICD-10-CM | POA: Diagnosis not present

## 2023-05-07 DIAGNOSIS — G8929 Other chronic pain: Secondary | ICD-10-CM | POA: Diagnosis not present

## 2023-05-07 DIAGNOSIS — Z7951 Long term (current) use of inhaled steroids: Secondary | ICD-10-CM | POA: Diagnosis not present

## 2023-05-07 DIAGNOSIS — F17200 Nicotine dependence, unspecified, uncomplicated: Secondary | ICD-10-CM | POA: Diagnosis not present

## 2023-05-07 DIAGNOSIS — Z7902 Long term (current) use of antithrombotics/antiplatelets: Secondary | ICD-10-CM | POA: Diagnosis not present

## 2023-05-07 NOTE — Telephone Encounter (Signed)
 Copied from CRM [id number]. Topic: Appointments - Scheduling Inquiry for Clinic >> May 07, 2023 10:01 AM Baldemar Lev wrote: Reason for CRM: Pt has questions about the imaging services at [facility], wants to speak to the clinic.   Best contact: [phone]

## 2023-05-07 NOTE — Telephone Encounter (Signed)
 Attempted to reach patient for further information. LM to call back.

## 2023-05-08 DIAGNOSIS — I69351 Hemiplegia and hemiparesis following cerebral infarction affecting right dominant side: Secondary | ICD-10-CM | POA: Diagnosis not present

## 2023-05-08 DIAGNOSIS — F17200 Nicotine dependence, unspecified, uncomplicated: Secondary | ICD-10-CM | POA: Diagnosis not present

## 2023-05-08 DIAGNOSIS — G43909 Migraine, unspecified, not intractable, without status migrainosus: Secondary | ICD-10-CM | POA: Diagnosis not present

## 2023-05-08 DIAGNOSIS — J449 Chronic obstructive pulmonary disease, unspecified: Secondary | ICD-10-CM | POA: Diagnosis not present

## 2023-05-08 DIAGNOSIS — Z7951 Long term (current) use of inhaled steroids: Secondary | ICD-10-CM | POA: Diagnosis not present

## 2023-05-08 DIAGNOSIS — Z4781 Encounter for orthopedic aftercare following surgical amputation: Secondary | ICD-10-CM | POA: Diagnosis not present

## 2023-05-08 DIAGNOSIS — Z556 Problems related to health literacy: Secondary | ICD-10-CM | POA: Diagnosis not present

## 2023-05-08 DIAGNOSIS — E785 Hyperlipidemia, unspecified: Secondary | ICD-10-CM | POA: Diagnosis not present

## 2023-05-08 DIAGNOSIS — G8929 Other chronic pain: Secondary | ICD-10-CM | POA: Diagnosis not present

## 2023-05-08 DIAGNOSIS — Z7902 Long term (current) use of antithrombotics/antiplatelets: Secondary | ICD-10-CM | POA: Diagnosis not present

## 2023-05-08 DIAGNOSIS — Z7985 Long-term (current) use of injectable non-insulin antidiabetic drugs: Secondary | ICD-10-CM | POA: Diagnosis not present

## 2023-05-08 DIAGNOSIS — Z89412 Acquired absence of left great toe: Secondary | ICD-10-CM | POA: Diagnosis not present

## 2023-05-08 DIAGNOSIS — E1151 Type 2 diabetes mellitus with diabetic peripheral angiopathy without gangrene: Secondary | ICD-10-CM | POA: Diagnosis not present

## 2023-05-08 DIAGNOSIS — I10 Essential (primary) hypertension: Secondary | ICD-10-CM | POA: Diagnosis not present

## 2023-05-08 DIAGNOSIS — Z48812 Encounter for surgical aftercare following surgery on the circulatory system: Secondary | ICD-10-CM | POA: Diagnosis not present

## 2023-05-08 DIAGNOSIS — E1169 Type 2 diabetes mellitus with other specified complication: Secondary | ICD-10-CM | POA: Diagnosis not present

## 2023-05-08 DIAGNOSIS — N200 Calculus of kidney: Secondary | ICD-10-CM | POA: Diagnosis not present

## 2023-05-08 DIAGNOSIS — M86172 Other acute osteomyelitis, left ankle and foot: Secondary | ICD-10-CM | POA: Diagnosis not present

## 2023-05-12 DIAGNOSIS — J449 Chronic obstructive pulmonary disease, unspecified: Secondary | ICD-10-CM | POA: Diagnosis not present

## 2023-05-12 DIAGNOSIS — I69351 Hemiplegia and hemiparesis following cerebral infarction affecting right dominant side: Secondary | ICD-10-CM | POA: Diagnosis not present

## 2023-05-12 DIAGNOSIS — E1151 Type 2 diabetes mellitus with diabetic peripheral angiopathy without gangrene: Secondary | ICD-10-CM | POA: Diagnosis not present

## 2023-05-12 DIAGNOSIS — Z556 Problems related to health literacy: Secondary | ICD-10-CM | POA: Diagnosis not present

## 2023-05-12 DIAGNOSIS — Z89412 Acquired absence of left great toe: Secondary | ICD-10-CM | POA: Diagnosis not present

## 2023-05-12 DIAGNOSIS — E785 Hyperlipidemia, unspecified: Secondary | ICD-10-CM | POA: Diagnosis not present

## 2023-05-12 DIAGNOSIS — Z7951 Long term (current) use of inhaled steroids: Secondary | ICD-10-CM | POA: Diagnosis not present

## 2023-05-12 DIAGNOSIS — M86172 Other acute osteomyelitis, left ankle and foot: Secondary | ICD-10-CM | POA: Diagnosis not present

## 2023-05-12 DIAGNOSIS — E1169 Type 2 diabetes mellitus with other specified complication: Secondary | ICD-10-CM | POA: Diagnosis not present

## 2023-05-12 DIAGNOSIS — G43909 Migraine, unspecified, not intractable, without status migrainosus: Secondary | ICD-10-CM | POA: Diagnosis not present

## 2023-05-12 DIAGNOSIS — Z7985 Long-term (current) use of injectable non-insulin antidiabetic drugs: Secondary | ICD-10-CM | POA: Diagnosis not present

## 2023-05-12 DIAGNOSIS — G8929 Other chronic pain: Secondary | ICD-10-CM | POA: Diagnosis not present

## 2023-05-12 DIAGNOSIS — I10 Essential (primary) hypertension: Secondary | ICD-10-CM | POA: Diagnosis not present

## 2023-05-12 DIAGNOSIS — Z7902 Long term (current) use of antithrombotics/antiplatelets: Secondary | ICD-10-CM | POA: Diagnosis not present

## 2023-05-12 DIAGNOSIS — Z4781 Encounter for orthopedic aftercare following surgical amputation: Secondary | ICD-10-CM | POA: Diagnosis not present

## 2023-05-12 DIAGNOSIS — N200 Calculus of kidney: Secondary | ICD-10-CM | POA: Diagnosis not present

## 2023-05-12 DIAGNOSIS — Z48812 Encounter for surgical aftercare following surgery on the circulatory system: Secondary | ICD-10-CM | POA: Diagnosis not present

## 2023-05-12 DIAGNOSIS — F17200 Nicotine dependence, unspecified, uncomplicated: Secondary | ICD-10-CM | POA: Diagnosis not present

## 2023-05-13 ENCOUNTER — Ambulatory Visit (INDEPENDENT_AMBULATORY_CARE_PROVIDER_SITE_OTHER): Admitting: Nurse Practitioner

## 2023-05-13 ENCOUNTER — Encounter (INDEPENDENT_AMBULATORY_CARE_PROVIDER_SITE_OTHER): Payer: Self-pay | Admitting: Nurse Practitioner

## 2023-05-13 VITALS — BP 94/64 | HR 101 | Resp 18 | Ht 66.0 in | Wt 215.0 lb

## 2023-05-13 DIAGNOSIS — I739 Peripheral vascular disease, unspecified: Secondary | ICD-10-CM

## 2023-05-14 ENCOUNTER — Telehealth (INDEPENDENT_AMBULATORY_CARE_PROVIDER_SITE_OTHER): Payer: Self-pay

## 2023-05-14 ENCOUNTER — Ambulatory Visit
Admission: RE | Admit: 2023-05-14 | Discharge: 2023-05-14 | Disposition: A | Discharge status: Home / Self Care | Source: Ambulatory Visit

## 2023-05-14 DIAGNOSIS — G43909 Migraine, unspecified, not intractable, without status migrainosus: Secondary | ICD-10-CM | POA: Diagnosis not present

## 2023-05-14 DIAGNOSIS — F17210 Nicotine dependence, cigarettes, uncomplicated: Secondary | ICD-10-CM | POA: Diagnosis not present

## 2023-05-14 DIAGNOSIS — G8929 Other chronic pain: Secondary | ICD-10-CM | POA: Diagnosis not present

## 2023-05-14 DIAGNOSIS — Z89412 Acquired absence of left great toe: Secondary | ICD-10-CM | POA: Diagnosis not present

## 2023-05-14 DIAGNOSIS — J449 Chronic obstructive pulmonary disease, unspecified: Secondary | ICD-10-CM | POA: Diagnosis not present

## 2023-05-14 DIAGNOSIS — E1169 Type 2 diabetes mellitus with other specified complication: Secondary | ICD-10-CM | POA: Diagnosis not present

## 2023-05-14 DIAGNOSIS — I700 Atherosclerosis of aorta: Secondary | ICD-10-CM | POA: Diagnosis not present

## 2023-05-14 DIAGNOSIS — I10 Essential (primary) hypertension: Secondary | ICD-10-CM | POA: Diagnosis not present

## 2023-05-14 DIAGNOSIS — Z4781 Encounter for orthopedic aftercare following surgical amputation: Secondary | ICD-10-CM | POA: Diagnosis not present

## 2023-05-14 DIAGNOSIS — I2510 Atherosclerotic heart disease of native coronary artery without angina pectoris: Secondary | ICD-10-CM | POA: Diagnosis not present

## 2023-05-14 DIAGNOSIS — I69351 Hemiplegia and hemiparesis following cerebral infarction affecting right dominant side: Secondary | ICD-10-CM | POA: Diagnosis not present

## 2023-05-14 DIAGNOSIS — J439 Emphysema, unspecified: Secondary | ICD-10-CM | POA: Diagnosis not present

## 2023-05-14 DIAGNOSIS — F17200 Nicotine dependence, unspecified, uncomplicated: Secondary | ICD-10-CM | POA: Diagnosis not present

## 2023-05-14 DIAGNOSIS — Z7951 Long term (current) use of inhaled steroids: Secondary | ICD-10-CM | POA: Diagnosis not present

## 2023-05-14 DIAGNOSIS — N200 Calculus of kidney: Secondary | ICD-10-CM | POA: Diagnosis not present

## 2023-05-14 DIAGNOSIS — Z7985 Long-term (current) use of injectable non-insulin antidiabetic drugs: Secondary | ICD-10-CM | POA: Diagnosis not present

## 2023-05-14 DIAGNOSIS — M86172 Other acute osteomyelitis, left ankle and foot: Secondary | ICD-10-CM | POA: Diagnosis not present

## 2023-05-14 DIAGNOSIS — E1151 Type 2 diabetes mellitus with diabetic peripheral angiopathy without gangrene: Secondary | ICD-10-CM | POA: Diagnosis not present

## 2023-05-14 DIAGNOSIS — E785 Hyperlipidemia, unspecified: Secondary | ICD-10-CM | POA: Diagnosis not present

## 2023-05-14 DIAGNOSIS — Z122 Encounter for screening for malignant neoplasm of respiratory organs: Secondary | ICD-10-CM | POA: Diagnosis not present

## 2023-05-14 DIAGNOSIS — Z7902 Long term (current) use of antithrombotics/antiplatelets: Secondary | ICD-10-CM | POA: Diagnosis not present

## 2023-05-14 DIAGNOSIS — Z48812 Encounter for surgical aftercare following surgery on the circulatory system: Secondary | ICD-10-CM | POA: Diagnosis not present

## 2023-05-14 DIAGNOSIS — Z556 Problems related to health literacy: Secondary | ICD-10-CM | POA: Diagnosis not present

## 2023-05-14 NOTE — Telephone Encounter (Signed)
 Haskell Linker with [facility] left a message requesting for wound care order to be fax over to the office.

## 2023-05-14 NOTE — Progress Notes (Signed)
 Subjective:    Patient ID: [name], female    DOB: 28-May-1965, 58 y.o.   MRN: [id number] Chief Complaint  Patient presents with   Follow-up    fu 2-3 weeks wound check    The patient underwent surgery on 04/01/2023, including:  PROCEDURE: 1.   Left common femoral artery endarterectomy and patch angioplasty 2.   Left lower extremity angiogram 3.   Stent placement to left popliteal artery with 5 mm x 6 cm Lifestent 4.   Stent placement to the proximal left SFA with 7 mm x 2.5 cm Viabahn stent   Today the patient is doing well post surgery.  All staples were removed.  She has a small pinpoint hole right in her groin area.  There is no tunneling or tracking.  The wound has gotten a little bit smaller in her groin today.  Her toe rotation is fairly healed except for just a small area of scabbing.    Review of Systems  Skin:  Positive for wound.  Neurological:  Positive for weakness.  All other systems reviewed and are negative.      Objective:   Physical Exam Vitals reviewed.  HENT:     Head: Normocephalic.  Cardiovascular:     Rate and Rhythm: Normal rate.  Pulmonary:     Effort: Pulmonary effort is normal.  Skin:    General: Skin is warm and dry.  Neurological:     Mental Status: She is alert and oriented to person, place, and time.  Psychiatric:        Mood and Affect: Mood normal.        Behavior: Behavior normal.        Thought Content: Thought content normal.        Judgment: Judgment normal.     BP 94/64   Pulse (!) 101   Resp 18   Ht 5\' 6"  (1.676 m)   Wt 215 lb (97.5 kg)   BMI 34.70 kg/m   Past Medical History:  Diagnosis Date   Anxiety    Cancer (HCC)    a spot on liver and treated    Complication of anesthesia    restless,easily upset   COPD (chronic obstructive pulmonary disease) (HCC)    Depression    Diabetes mellitus without complication (HCC)    Diverticulitis    Fatty liver    GERD (gastroesophageal reflux disease)     Headache(784.0)    migraines   History of kidney stones    Hyperlipidemia    Hypertension    PAD (peripheral artery disease) (HCC)    Pneumonia    Restless    Stroke [facility])     Social History   Socioeconomic History   Marital status: Married    Spouse name: Not on file   Number of children: Not on file   Years of education: Not on file   Highest education level: 10th grade  Occupational History   Not on file  Tobacco Use   Smoking status: Every Day    Current packs/day: 1.00    Average packs/day: 1 pack/day for 20.0 years (20.0 ttl pk-yrs)    Types: Cigarettes   Smokeless tobacco: Never  Vaping Use   Vaping status: Never Used  Substance and Sexual Activity   Alcohol  use: No   Drug use: Yes    Types: Marijuana, Oxycodone     Comment: last smoked 2 days ago  8/4   Sexual activity: Not on file  Other  Topics Concern   Not on file  Social History Narrative   Not on file   Social Drivers of Health   Financial Resource Strain: Low Risk  (11/19/2022)   Overall Financial Resource Strain (CARDIA)    Difficulty of Paying Living Expenses: Not very hard  Food Insecurity: No Food Insecurity (04/15/2023)   Hunger Vital Sign    Worried About Running Out of Food in the Last Year: Never true    Ran Out of Food in the Last Year: Never true  Transportation Needs: No Transportation Needs (04/15/2023)   PRAPARE - Administrator, Civil Service (Medical): No    Lack of Transportation (Non-Medical): No  Physical Activity: Inactive (11/19/2022)   Exercise Vital Sign    Days of Exercise per Week: 0 days    Minutes of Exercise per Session: 0 min  Stress: Stress Concern Present (11/19/2022)   [employer] of Occupational Health - Occupational Stress Questionnaire    Feeling of Stress : To some extent  Social Connections: Moderately Isolated (03/26/2023)   Social Connection and Isolation Panel [NHANES]    Frequency of Communication with Friends and Family: More than three times  a week    Frequency of Social Gatherings with Friends and Family: Patient declined    Attends Religious Services: Never    Database administrator or Organizations: No    Attends Banker Meetings: Never    Marital Status: Married  Catering manager Violence: Not At Risk (04/15/2023)   Humiliation, Afraid, Rape, and Kick questionnaire    Fear of Current or Ex-Partner: No    Emotionally Abused: No    Physically Abused: No    Sexually Abused: No    Past Surgical History:  Procedure Laterality Date   ABDOMINAL HYSTERECTOMY     ACHILLES TENDON LENGTHENING Right 08/18/2017   Procedure: ACHILLES TENDON LENGTHENING;  Surgeon: Hardy Lia, MD;  Location: [facility] OR;  Service: Orthopedics;  Laterality: Right;   AMPUTATION TOE Left 04/02/2023   Procedure: AMPUTATION, TOE;  Surgeon: Evertt Hoe, DPM;  Location: [facility] ORS;  Service: Orthopedics/Podiatry;  Laterality: Left;  LEFT GREAT TOE   ANTERIOR CERVICAL DECOMP/DISCECTOMY FUSION N/A 03/11/2012   Procedure: ANTERIOR CERVICAL DECOMPRESSION/DISCECTOMY FUSION 1 LEVEL;  Surgeon: Elder Greening, MD;  Location: [facility] NEURO OR[facility];  Service: Neurosurgery;  Laterality: N/A;  Cervical five-six anterior cervical decompression with fusion interbody prothesis plating and bonegraft   BACK SURGERY     ENDARTERECTOMY FEMORAL Left 04/01/2023   Procedure: ENDARTERECTOMY, FEMORAL;  Surgeon: Jackquelyn Mass, MD;  Location: [facility] ORS;  Service: Vascular;  Laterality: Left;  with Popiteal stent placment   EXTERNAL FIXATION LEG Right 08/10/2017   Procedure: EXTERNAL FIXATION ANKLE;  Surgeon: Arlyne Lame, MD;  Location: [facility] ORS;  Service: Orthopedics;  Laterality: Right;   EXTERNAL FIXATION REMOVAL Right 08/18/2017   Procedure: REMOVAL EXTERNAL FIXATION LEG;  Surgeon: Hardy Lia, MD;  Location: [facility] OR;  Service: Orthopedics;  Laterality: Right;   EYE SURGERY     HERNIA REPAIR     IR FL GUIDED LOC OF NEEDLE/CATH TIP FOR SPINAL INJECTION LT  12/09/2021    IR FL GUIDED LOC OF NEEDLE/CATH TIP FOR SPINAL INJECTION RT  12/09/2021   JOINT REPLACEMENT     bil knees   LOWER EXTREMITY ANGIOGRAPHY Left 03/27/2023   Procedure: Lower Extremity Angiography;  Surgeon: Jackquelyn Mass, MD;  Location: [facility] INVASIVE CV LAB;  Service: Cardiovascular;  Laterality: Left;   ORIF ANKLE FRACTURE  Right 08/18/2017   Procedure: OPEN REDUCTION INTERNAL FIXATION (ORIF) ANKLE FRACTURE;  Surgeon: Hardy Lia, MD;  Location: [facility] OR;  Service: Orthopedics;  Laterality: Right;   REPLACEMENT TOTAL KNEE BILATERAL      Family History  Problem Relation Age of Onset   Diabetes Mother    Hypertension Mother    Hypertension Father     Allergies  Allergen Reactions   Tramadol Nausea And Vomiting and Hypertension   Augmentin [Amoxicillin-Pot Clavulanate] Rash    Tolerated Zosyn  03/26/23       Latest Ref Rng & Units 04/10/2023    3:00 PM 04/02/2023    1:15 PM 04/01/2023   11:36 PM  CBC  WBC 4.0 - 10.5 K/uL 12.6  14.3  10.7   Hemoglobin 12.0 - 15.0 g/dL 40.9  81.1  91.4   Hematocrit 36.0 - 46.0 % 41.9  37.0  35.2   Platelets 150 - 400 K/uL 510  294  287       CMP     Component Value Date/Time   NA 139 04/10/2023 1500   NA 135 (L) 09/22/2013 2251   K 4.1 04/10/2023 1500   K 3.8 09/22/2013 2251   CL 105 04/10/2023 1500   CL 100 09/22/2013 2251   CO2 19 (L) 04/10/2023 1500   CO2 27 09/22/2013 2251   GLUCOSE 158 (H) 04/10/2023 1500   GLUCOSE 335 (H) 09/22/2013 2251   BUN 11 04/10/2023 1500   BUN 11 09/22/2013 2251   CREATININE 0.71 04/10/2023 1500   CREATININE 0.62 05/15/2021 1351   CALCIUM  9.3 04/10/2023 1500   CALCIUM  8.3 (L) 09/22/2013 2251   PROT 7.6 04/10/2023 1500   PROT 7.2 09/22/2013 2251   ALBUMIN 3.5 04/10/2023 1500   ALBUMIN 2.6 (L) 09/22/2013 2251   AST 14 (L) 04/10/2023 1500   AST 17 09/22/2013 2251   ALT 12 04/10/2023 1500   ALT 16 09/22/2013 2251   ALKPHOS 65 04/10/2023 1500   ALKPHOS 78 09/22/2013 2251   BILITOT 0.7 04/10/2023  1500   BILITOT 0.2 09/22/2013 2251   GFR 91.12 09/23/2017 1627   EGFR 104 05/15/2021 1351   GFRNONAA >60 04/10/2023 1500   GFRNONAA >60 09/22/2013 2251     No results found.     Assessment & Plan:   1. PAD (peripheral artery disease) (HCC) (Primary) Wound has improved and gotten smaller.  There is a small size of the wound and instructed him to get some hydrocolloid Band-Aid dressings and change it weekly or sooner if it becomes extremity swelling or falls off.  Will have her return in about 3 to 4 weeks for wound reevaluation.   Current Outpatient Medications on File Prior to Visit  Medication Sig Dispense Refill   albuterol  (VENTOLIN  HFA) 108 (90 Base) MCG/ACT inhaler INHALE 2 INHALATIONS BY MOUTH  EVERY 4 HOURS AS NEEDED FOR  WHEEZING OR FOR SHORTNESS OF  BREATH 51 g 2   atorvastatin  (LIPITOR) 40 MG tablet TAKE 1 TABLET BY MOUTH AT  BEDTIME 90 tablet 3   azelastine  (ASTELIN ) 0.1 % nasal spray Place 2 sprays into both nostrils 2 (two) times daily. 30 mL 12   buPROPion  (WELLBUTRIN  XL) 150 MG 24 hr tablet Take 1 tablet (150 mg total) by mouth daily. 100 tablet 1   celecoxib (CELEBREX) 100 MG capsule Take 100 mg by mouth daily.     citalopram  (CELEXA ) 40 MG tablet TAKE 1 TABLET BY MOUTH DAILY 100 tablet 1   clopidogrel  (PLAVIX ) 75 MG  tablet TAKE ONE TABLET BY MOUTH DAILY AT 9AM 100 tablet 11   cyclobenzaprine  (FLEXERIL ) 10 MG tablet Take 10 mg by mouth 3 (three) times daily.     fluticasone  (FLONASE ) 50 MCG/ACT nasal spray Place 2 sprays into both nostrils daily. Use for 4-6 weeks then stop and use seasonally or as needed. 16 g 0   gabapentin  (NEURONTIN ) 600 MG tablet TAKE 1 TABLET BY MOUTH 4 TIMES  DAILY 400 tablet 2   leptospermum manuka honey (MEDIHONEY) gel Apply 1 Application topically daily. 44 mL 0   midodrine  (PROAMATINE ) 5 MG tablet Take 1 tablet (5 mg total) by mouth 3 (three) times daily with meals. 21 tablet 0   MOUNJARO  10 MG/0.5ML Pen INJECT SUBCUTANEOUSLY 10 MG ONCE  WEEKLY 6 mL 3   mupirocin  ointment (BACTROBAN ) 2 % Apply 1 Application topically 2 (two) times daily. For 10 days 22 g 0   nystatin  (MYCOSTATIN /NYSTOP ) powder APPLY 1 APPLICATION TOPICALLY THREE TIMES DAILY AS NEEDED FOR YEAST RASH INTERTRIGO 30 g 11   ondansetron  (ZOFRAN -ODT) 4 MG disintegrating tablet DISSOLVE 1 TABLET ON THE TONGUE EVERY 8 HOURS AS NEEDED FOR NAUSEA AND VOMITING 30 tablet 2   oxyCODONE -acetaminophen  (PERCOCET/ROXICET) 5-325 MG tablet Take 2 tablets by mouth every 6 (six) hours as needed for moderate pain (pain score 4-6). 20 tablet 0   pantoprazole  (PROTONIX ) 40 MG tablet TAKE 1 TABLET BY MOUTH DAILY AT  9 AM 100 tablet 0   QUEtiapine  (SEROQUEL ) 100 MG tablet TAKE 1 TABLET BY MOUTH DAILY AT  9 PM AT BEDTIME 100 tablet 2   SPIKEVAX syringe      topiramate  (TOPAMAX ) 100 MG tablet TAKE 1 TABLET BY MOUTH TWICE  DAILY 200 tablet 2   TRELEGY ELLIPTA  100-62.5-25 MCG/ACT AEPB USE 1 INHALATION BY MOUTH ONCE  DAILY AT THE SAME TIME EACH DAY 180 each 3   VITAMIN D  PO Take by mouth.     Vitamin D , Ergocalciferol , (DRISDOL ) 1.25 MG (50000 UNIT) CAPS capsule TAKE 1 CAPSULE BY MOUTH EVERY WEDNESDAY @9AM  4 capsule 11   ciprofloxacin  (CIPRO ) 500 MG tablet Take 500 mg by mouth 2 (two) times daily. (Patient not taking: Reported on 04/29/2023)     doxycycline (MONODOX) 100 MG capsule Take 100 mg by mouth 2 (two) times daily.     fluconazole  (DIFLUCAN ) 150 MG tablet Take one tablet by mouth on Day 1. Repeat dose 2nd tablet on Day 3. 2 tablet 0   metFORMIN  (GLUCOPHAGE ) 1000 MG tablet TAKE 1 TABLET BY MOUTH TWICE  DAILY WITH MEALS (Patient not taking: Reported on 03/02/2023) 200 tablet 2   methocarbamol  (ROBAXIN ) 500 MG tablet Take 500 mg by mouth 3 (three) times daily.     nicotine  (NICODERM CQ  - DOSED IN MG/24 HOURS) 14 mg/24hr patch Place onto the skin. (Patient not taking: Reported on 05/13/2023)     trimethoprim -polymyxin b  (POLYTRIM ) ophthalmic solution Place 1 drop into both eyes 4 (four) times  daily. For 7-10 days 10 mL 0   No current facility-administered medications on file prior to visit.    There are no Patient Instructions on file for this visit. No follow-ups on file.   Deanna Derks E Javoris Star, NP

## 2023-05-15 ENCOUNTER — Telehealth: Payer: Self-pay

## 2023-05-15 NOTE — Telephone Encounter (Signed)
 Copied from CRM [id number]. Topic: Clinical - Home Health Verbal Orders >> May 15, 2023 10:01 AM Lorrane Rosette wrote: Caller/Agency: [name] with [facility]  Callback Number: [id number] option# 2  [name] reports that patient will have a missed visit because the home health aid is not being able to make visit due having a sick child

## 2023-05-15 NOTE — Telephone Encounter (Signed)
 Understood. I don't believe they need a call back for this reason. Thank you!  Domingo Friend, DO [facility] 05/15/2023, 11:55 AM

## 2023-05-17 DIAGNOSIS — T8131XA Disruption of external operation (surgical) wound, not elsewhere classified, initial encounter: Secondary | ICD-10-CM | POA: Diagnosis not present

## 2023-05-17 DIAGNOSIS — L98499 Non-pressure chronic ulcer of skin of other sites with unspecified severity: Secondary | ICD-10-CM | POA: Diagnosis not present

## 2023-05-18 NOTE — Telephone Encounter (Signed)
 On your desk

## 2023-05-19 DIAGNOSIS — E1151 Type 2 diabetes mellitus with diabetic peripheral angiopathy without gangrene: Secondary | ICD-10-CM | POA: Diagnosis not present

## 2023-05-19 DIAGNOSIS — Z48812 Encounter for surgical aftercare following surgery on the circulatory system: Secondary | ICD-10-CM | POA: Diagnosis not present

## 2023-05-19 DIAGNOSIS — Z7902 Long term (current) use of antithrombotics/antiplatelets: Secondary | ICD-10-CM | POA: Diagnosis not present

## 2023-05-19 DIAGNOSIS — Z7951 Long term (current) use of inhaled steroids: Secondary | ICD-10-CM | POA: Diagnosis not present

## 2023-05-19 DIAGNOSIS — Z4781 Encounter for orthopedic aftercare following surgical amputation: Secondary | ICD-10-CM | POA: Diagnosis not present

## 2023-05-19 DIAGNOSIS — I10 Essential (primary) hypertension: Secondary | ICD-10-CM | POA: Diagnosis not present

## 2023-05-19 DIAGNOSIS — Z89412 Acquired absence of left great toe: Secondary | ICD-10-CM | POA: Diagnosis not present

## 2023-05-19 DIAGNOSIS — G43909 Migraine, unspecified, not intractable, without status migrainosus: Secondary | ICD-10-CM | POA: Diagnosis not present

## 2023-05-19 DIAGNOSIS — Z7985 Long-term (current) use of injectable non-insulin antidiabetic drugs: Secondary | ICD-10-CM | POA: Diagnosis not present

## 2023-05-19 DIAGNOSIS — J449 Chronic obstructive pulmonary disease, unspecified: Secondary | ICD-10-CM | POA: Diagnosis not present

## 2023-05-19 DIAGNOSIS — G8929 Other chronic pain: Secondary | ICD-10-CM | POA: Diagnosis not present

## 2023-05-19 DIAGNOSIS — M86172 Other acute osteomyelitis, left ankle and foot: Secondary | ICD-10-CM | POA: Diagnosis not present

## 2023-05-19 DIAGNOSIS — E1169 Type 2 diabetes mellitus with other specified complication: Secondary | ICD-10-CM | POA: Diagnosis not present

## 2023-05-19 DIAGNOSIS — Z556 Problems related to health literacy: Secondary | ICD-10-CM | POA: Diagnosis not present

## 2023-05-19 DIAGNOSIS — I69351 Hemiplegia and hemiparesis following cerebral infarction affecting right dominant side: Secondary | ICD-10-CM | POA: Diagnosis not present

## 2023-05-19 DIAGNOSIS — E785 Hyperlipidemia, unspecified: Secondary | ICD-10-CM | POA: Diagnosis not present

## 2023-05-19 DIAGNOSIS — N200 Calculus of kidney: Secondary | ICD-10-CM | POA: Diagnosis not present

## 2023-05-19 DIAGNOSIS — F17200 Nicotine dependence, unspecified, uncomplicated: Secondary | ICD-10-CM | POA: Diagnosis not present

## 2023-05-19 NOTE — Telephone Encounter (Signed)
 faxed

## 2023-05-21 DIAGNOSIS — J449 Chronic obstructive pulmonary disease, unspecified: Secondary | ICD-10-CM | POA: Diagnosis not present

## 2023-05-21 DIAGNOSIS — Z7985 Long-term (current) use of injectable non-insulin antidiabetic drugs: Secondary | ICD-10-CM | POA: Diagnosis not present

## 2023-05-21 DIAGNOSIS — E785 Hyperlipidemia, unspecified: Secondary | ICD-10-CM | POA: Diagnosis not present

## 2023-05-21 DIAGNOSIS — G43909 Migraine, unspecified, not intractable, without status migrainosus: Secondary | ICD-10-CM | POA: Diagnosis not present

## 2023-05-21 DIAGNOSIS — N200 Calculus of kidney: Secondary | ICD-10-CM | POA: Diagnosis not present

## 2023-05-21 DIAGNOSIS — E1169 Type 2 diabetes mellitus with other specified complication: Secondary | ICD-10-CM | POA: Diagnosis not present

## 2023-05-21 DIAGNOSIS — I69351 Hemiplegia and hemiparesis following cerebral infarction affecting right dominant side: Secondary | ICD-10-CM | POA: Diagnosis not present

## 2023-05-21 DIAGNOSIS — Z7902 Long term (current) use of antithrombotics/antiplatelets: Secondary | ICD-10-CM | POA: Diagnosis not present

## 2023-05-21 DIAGNOSIS — Z48812 Encounter for surgical aftercare following surgery on the circulatory system: Secondary | ICD-10-CM | POA: Diagnosis not present

## 2023-05-21 DIAGNOSIS — G8929 Other chronic pain: Secondary | ICD-10-CM | POA: Diagnosis not present

## 2023-05-21 DIAGNOSIS — I10 Essential (primary) hypertension: Secondary | ICD-10-CM | POA: Diagnosis not present

## 2023-05-21 DIAGNOSIS — Z556 Problems related to health literacy: Secondary | ICD-10-CM | POA: Diagnosis not present

## 2023-05-21 DIAGNOSIS — M86172 Other acute osteomyelitis, left ankle and foot: Secondary | ICD-10-CM | POA: Diagnosis not present

## 2023-05-21 DIAGNOSIS — F17200 Nicotine dependence, unspecified, uncomplicated: Secondary | ICD-10-CM | POA: Diagnosis not present

## 2023-05-21 DIAGNOSIS — E1151 Type 2 diabetes mellitus with diabetic peripheral angiopathy without gangrene: Secondary | ICD-10-CM | POA: Diagnosis not present

## 2023-05-21 DIAGNOSIS — Z7951 Long term (current) use of inhaled steroids: Secondary | ICD-10-CM | POA: Diagnosis not present

## 2023-05-21 DIAGNOSIS — Z4781 Encounter for orthopedic aftercare following surgical amputation: Secondary | ICD-10-CM | POA: Diagnosis not present

## 2023-05-21 DIAGNOSIS — Z89412 Acquired absence of left great toe: Secondary | ICD-10-CM | POA: Diagnosis not present

## 2023-05-22 ENCOUNTER — Ambulatory Visit: Payer: 59

## 2023-05-22 DIAGNOSIS — Z0000 Encounter for general adult medical examination without abnormal findings: Secondary | ICD-10-CM

## 2023-05-22 DIAGNOSIS — Z1211 Encounter for screening for malignant neoplasm of colon: Secondary | ICD-10-CM

## 2023-05-22 NOTE — Progress Notes (Signed)
 Subjective:   [name] is a 58 y.o. who presents for a Medicare Wellness preventive visit.  As a reminder, Annual Wellness Visits don't include a physical exam, and some assessments may be limited, especially if this visit is performed virtually. We may recommend an in-person visit if needed.  Visit Complete: Virtual I connected with  Vanderbilt Gene on 05/22/23 by a audio enabled telemedicine application and verified that I am speaking with the correct person using two identifiers.  Patient Location: Home  Provider Location: Office/Clinic  I discussed the limitations of evaluation and management by telemedicine. The patient expressed understanding and agreed to proceed.  Vital Signs: Because this visit was a virtual/telehealth visit, some criteria may be missing or patient reported. Any vitals not documented were not able to be obtained and vitals that have been documented are patient reported.  VideoDeclined- This patient declined Librarian, academic. Therefore the visit was completed with audio only.  Persons Participating in Visit: Patient.  AWV Questionnaire: No: Patient Medicare AWV questionnaire was not completed prior to this visit.  Cardiac Risk Factors include: advanced age (>82men, >75 women);dyslipidemia;diabetes mellitus;hypertension;sedentary lifestyle;obesity (BMI >30kg/m2);smoking/ tobacco exposure     Objective:     Today's Vitals   05/22/23 1521  PainSc: 5    There is no height or weight on file to calculate BMI.     05/22/2023    3:29 PM 04/10/2023    2:59 PM 04/02/2023    9:12 AM 04/01/2023   12:16 PM 03/27/2023    1:20 PM 05/16/2022    3:36 PM 12/11/2021    5:49 AM  Advanced Directives  Does Patient Have a Medical Advance Directive? No No No No No No No  Would patient like information on creating a medical advance directive? No - Patient declined  No - Patient declined No - Patient declined  No - Patient declined No -  Patient declined    Current Medications (verified) Outpatient Encounter Medications as of 05/22/2023  Medication Sig   albuterol  (VENTOLIN  HFA) 108 (90 Base) MCG/ACT inhaler INHALE 2 INHALATIONS BY MOUTH  EVERY 4 HOURS AS NEEDED FOR  WHEEZING OR FOR SHORTNESS OF  BREATH   atorvastatin  (LIPITOR) 40 MG tablet TAKE 1 TABLET BY MOUTH AT  BEDTIME   azelastine  (ASTELIN ) 0.1 % nasal spray Place 2 sprays into both nostrils 2 (two) times daily.   buPROPion  (WELLBUTRIN  XL) 150 MG 24 hr tablet Take 1 tablet (150 mg total) by mouth daily.   citalopram  (CELEXA ) 40 MG tablet TAKE 1 TABLET BY MOUTH DAILY   clopidogrel  (PLAVIX ) 75 MG tablet TAKE ONE TABLET BY MOUTH DAILY AT 9AM   cyclobenzaprine  (FLEXERIL ) 10 MG tablet Take 10 mg by mouth 3 (three) times daily.   fluticasone  (FLONASE ) 50 MCG/ACT nasal spray Place 2 sprays into both nostrils daily. Use for 4-6 weeks then stop and use seasonally or as needed.   gabapentin  (NEURONTIN ) 600 MG tablet TAKE 1 TABLET BY MOUTH 4 TIMES  DAILY   leptospermum manuka honey (MEDIHONEY) gel Apply 1 Application topically daily.   midodrine  (PROAMATINE ) 5 MG tablet Take 1 tablet (5 mg total) by mouth 3 (three) times daily with meals.   MOUNJARO  10 MG/0.5ML Pen INJECT SUBCUTANEOUSLY 10 MG ONCE WEEKLY   mupirocin  ointment (BACTROBAN ) 2 % Apply 1 Application topically 2 (two) times daily. For 10 days   nystatin  (MYCOSTATIN /NYSTOP ) powder APPLY 1 APPLICATION TOPICALLY THREE TIMES DAILY AS NEEDED FOR YEAST RASH INTERTRIGO   ondansetron  (  ZOFRAN -ODT) 4 MG disintegrating tablet DISSOLVE 1 TABLET ON THE TONGUE EVERY 8 HOURS AS NEEDED FOR NAUSEA AND VOMITING   oxyCODONE -acetaminophen  (PERCOCET/ROXICET) 5-325 MG tablet Take 2 tablets by mouth every 6 (six) hours as needed for moderate pain (pain score 4-6).   pantoprazole  (PROTONIX ) 40 MG tablet TAKE 1 TABLET BY MOUTH DAILY AT  9 AM   SPIKEVAX syringe    topiramate  (TOPAMAX ) 100 MG tablet TAKE 1 TABLET BY MOUTH TWICE  DAILY   TRELEGY  ELLIPTA 100-62.5-25 MCG/ACT AEPB USE 1 INHALATION BY MOUTH ONCE  DAILY AT THE SAME TIME EACH DAY   trimethoprim -polymyxin b  (POLYTRIM ) ophthalmic solution Place 1 drop into both eyes 4 (four) times daily. For 7-10 days   Vitamin D , Ergocalciferol , (DRISDOL ) 1.25 MG (50000 UNIT) CAPS capsule TAKE 1 CAPSULE BY MOUTH EVERY WEDNESDAY @9AM    [DISCONTINUED] VITAMIN D  PO Take by mouth.   celecoxib (CELEBREX) 100 MG capsule Take 100 mg by mouth daily. (Patient not taking: Reported on 05/22/2023)   ciprofloxacin  (CIPRO ) 500 MG tablet Take 500 mg by mouth 2 (two) times daily. (Patient not taking: Reported on 05/22/2023)   doxycycline (MONODOX) 100 MG capsule Take 100 mg by mouth 2 (two) times daily. (Patient not taking: Reported on 05/22/2023)   fluconazole  (DIFLUCAN ) 150 MG tablet Take one tablet by mouth on Day 1. Repeat dose 2nd tablet on Day 3. (Patient not taking: Reported on 05/22/2023)   metFORMIN  (GLUCOPHAGE ) 1000 MG tablet TAKE 1 TABLET BY MOUTH TWICE  DAILY WITH MEALS (Patient not taking: Reported on 03/02/2023)   methocarbamol  (ROBAXIN ) 500 MG tablet Take 500 mg by mouth 3 (three) times daily. (Patient not taking: Reported on 05/22/2023)   nicotine  (NICODERM CQ  - DOSED IN MG/24 HOURS) 14 mg/24hr patch Place onto the skin. (Patient not taking: Reported on 04/29/2023)   QUEtiapine  (SEROQUEL ) 100 MG tablet TAKE 1 TABLET BY MOUTH DAILY AT  9 PM AT BEDTIME (Patient not taking: Reported on 05/22/2023)   No facility-administered encounter medications on file as of 05/22/2023.    Allergies (verified) Tramadol and Augmentin [amoxicillin-pot clavulanate]   History: Past Medical History:  Diagnosis Date   Anxiety    Cancer (HCC)    a spot on liver and treated    Complication of anesthesia    restless,easily upset   COPD (chronic obstructive pulmonary disease) (HCC)    Depression    Diabetes mellitus without complication (HCC)    Diverticulitis    Fatty liver    GERD (gastroesophageal reflux disease)     Headache(784.0)    migraines   History of kidney stones    Hyperlipidemia    Hypertension    PAD (peripheral artery disease) (HCC)    Pneumonia    Restless    Stroke [facility])    Past Surgical History:  Procedure Laterality Date   ABDOMINAL HYSTERECTOMY     ACHILLES TENDON LENGTHENING Right 08/18/2017   Procedure: ACHILLES TENDON LENGTHENING;  Surgeon: Hardy Lia, MD;  Location: [facility] OR;  Service: Orthopedics;  Laterality: Right;   AMPUTATION TOE Left 04/02/2023   Procedure: AMPUTATION, TOE;  Surgeon: Evertt Hoe, DPM;  Location: [facility] ORS;  Service: Orthopedics/Podiatry;  Laterality: Left;  LEFT GREAT TOE   ANTERIOR CERVICAL DECOMP/DISCECTOMY FUSION N/A 03/11/2012   Procedure: ANTERIOR CERVICAL DECOMPRESSION/DISCECTOMY FUSION 1 LEVEL;  Surgeon: Elder Greening, MD;  Location: [facility] NEURO ORS;  Service: Neurosurgery;  Laterality: N/A;  Cervical five-six anterior cervical decompression with fusion interbody prothesis plating and bonegraft   BACK SURGERY  ENDARTERECTOMY FEMORAL Left 04/01/2023   Procedure: ENDARTERECTOMY, FEMORAL;  Surgeon: Jackquelyn Mass, MD;  Location: [facility] ORS;  Service: Vascular;  Laterality: Left;  with Popiteal stent placment   EXTERNAL FIXATION LEG Right 08/10/2017   Procedure: EXTERNAL FIXATION ANKLE;  Surgeon: Arlyne Lame, MD;  Location: [facility] ORS;  Service: Orthopedics;  Laterality: Right;   EXTERNAL FIXATION REMOVAL Right 08/18/2017   Procedure: REMOVAL EXTERNAL FIXATION LEG;  Surgeon: Hardy Lia, MD;  Location: [facility] OR;  Service: Orthopedics;  Laterality: Right;   EYE SURGERY     HERNIA REPAIR     IR FL GUIDED LOC OF NEEDLE/CATH TIP FOR SPINAL INJECTION LT  12/09/2021   IR FL GUIDED LOC OF NEEDLE/CATH TIP FOR SPINAL INJECTION RT  12/09/2021   JOINT REPLACEMENT     bil knees   LOWER EXTREMITY ANGIOGRAPHY Left 03/27/2023   Procedure: Lower Extremity Angiography;  Surgeon: Jackquelyn Mass, MD;  Location: [facility] INVASIVE CV LAB;  Service:  Cardiovascular;  Laterality: Left;   ORIF ANKLE FRACTURE Right 08/18/2017   Procedure: OPEN REDUCTION INTERNAL FIXATION (ORIF) ANKLE FRACTURE;  Surgeon: Hardy Lia, MD;  Location: [facility] OR;  Service: Orthopedics;  Laterality: Right;   REPLACEMENT TOTAL KNEE BILATERAL     Family History  Problem Relation Age of Onset   Diabetes Mother    Hypertension Mother    Hypertension Father    Social History   Socioeconomic History   Marital status: Married    Spouse name: Not on file   Number of children: Not on file   Years of education: Not on file   Highest education level: 10th grade  Occupational History   Not on file  Tobacco Use   Smoking status: Every Day    Current packs/day: 1.00    Average packs/day: 1 pack/day for 20.0 years (20.0 ttl pk-yrs)    Types: Cigarettes   Smokeless tobacco: Never  Vaping Use   Vaping status: Never Used  Substance and Sexual Activity   Alcohol  use: No   Drug use: Yes    Types: Marijuana, Oxycodone     Comment: last smoked 2 days ago  8/4   Sexual activity: Not on file  Other Topics Concern   Not on file  Social History Narrative   Not on file   Social Drivers of Health   Financial Resource Strain: Low Risk  (05/22/2023)   Overall Financial Resource Strain (CARDIA)    Difficulty of Paying Living Expenses: Not hard at all  Food Insecurity: No Food Insecurity (05/22/2023)   Hunger Vital Sign    Worried About Running Out of Food in the Last Year: Never true    Ran Out of Food in the Last Year: Never true  Transportation Needs: No Transportation Needs (05/22/2023)   PRAPARE - Administrator, Civil Service (Medical): No    Lack of Transportation (Non-Medical): No  Physical Activity: Inactive (05/22/2023)   Exercise Vital Sign    Days of Exercise per Week: 0 days    Minutes of Exercise per Session: 0 min  Stress: No Stress Concern Present (05/22/2023)   [employer] of Occupational Health - Occupational Stress Questionnaire    Feeling  of Stress : Only a little  Social Connections: Moderately Isolated (05/22/2023)   Social Connection and Isolation Panel [NHANES]    Frequency of Communication with Friends and Family: More than three times a week    Frequency of Social Gatherings with Friends and Family: Once a week  Attends Religious Services: Never    Active Member of Clubs or Organizations: No    Attends Banker Meetings: Never    Marital Status: Married    Tobacco Counseling Ready to quit: Not Answered Counseling given: Not Answered    Clinical Intake:  Pre-visit preparation completed: Yes  Pain : 0-10 Pain Score: 5  Pain Type: Chronic pain Pain Location: Back Pain Orientation: Lower Pain Descriptors / Indicators: Aching, Discomfort, Constant Pain Onset: More than a month ago Pain Frequency: Constant Pain Relieving Factors: pain med  Pain Relieving Factors: pain med  BMI - recorded: 34.7 Nutritional Status: BMI > 30  Obese Nutritional Risks: None Diabetes: Yes CBG done?: No Did pt. bring in CBG monitor from home?: No  Lab Results  Component Value Date   HGBA1C 5.2 05/06/2022   HGBA1C 6.7 (H) 12/09/2021   HGBA1C 9.7 (H) 05/15/2021     How often do you need to have someone help you when you read instructions, pamphlets, or other written materials from your doctor or pharmacy?: 1 - Never  Interpreter Needed?: No  Information entered by :: Dellie Fergusson, LPN   Activities of Daily Living    05/22/2023    3:30 PM 05/21/2023    9:28 AM  In your present state of health, do you have any difficulty performing the following activities:  Hearing? 1 1  Vision? 0 0  Difficulty concentrating or making decisions? 0 0  Walking or climbing stairs? 1 1  Dressing or bathing? 0 0  Doing errands, shopping? 1 1  Preparing Food and eating ? Y Y  Using the Toilet? Y Y  In the past six months, have you accidently leaked urine? Y Y  Do you have problems with loss of bowel control? N N  Managing  your Medications? N N  Managing your Finances? N N  Housekeeping or managing your Housekeeping? Colie Dawes    Patient Care Team: Raina Bunting, DO as PCP - General (Family Medicine) Constancia Delton, MD as PCP - Cardiology [facility], [place], [place] as [facility] Care Management  Indicate any recent Medical Services you may have received from other than [facility] providers in the past year (date may be approximate).     Assessment:    This is a routine wellness examination for [place].  Hearing/Vision screen Hearing Screening - Comments:: NO AIDS Vision Screening - Comments:: WEARS GLASSES ALL THE TIME- [place]   Goals Addressed             This Visit's Progress    DIET - INCREASE WATER  INTAKE         Depression Screen     05/22/2023    3:28 PM 05/16/2022    3:34 PM 05/06/2022    2:43 PM 05/15/2021    1:59 PM 04/17/2021    2:35 PM 03/04/2021    2:30 PM 01/18/2021    1:10 PM  PHQ 2/9 Scores  PHQ - 2 Score 4 2 2 2 2 2  0  PHQ- 9 Score  4 12 15 15 10 5     Fall Risk     05/22/2023    3:30 PM 05/21/2023    9:28 AM 04/29/2023    2:35 PM 04/22/2023    2:59 PM 05/16/2022    3:36 PM  Fall Risk   Falls in the past year? 0 0 1 1 0  Number falls in past yr: 0 0 0 0 0  Injury with Fall?  0  0 0 0  Risk for fall due to : No Fall Risks  Other (Comment)  No Fall Risks  Risk for fall due to: Comment   Has to use a Hoyer lift for transfers    Follow up Falls prevention discussed;Falls evaluation completed  Falls prevention discussed  Falls prevention discussed;Falls evaluation completed    MEDICARE RISK AT HOME:  Medicare Risk at Home Any stairs in or around the home?: No If so, are there any without handrails?: No Home free of loose throw rugs in walkways, pet beds, electrical cords, etc?: Yes Adequate lighting in your home to reduce risk of falls?: Yes Life alert?: No Use of a cane, walker or w/c?: Yes (W/C) Grab bars in the bathroom?: No Shower chair or  bench in shower?: Yes Elevated toilet seat or a handicapped toilet?: Yes  TIMED UP AND GO:  Was the test performed?  No  Cognitive Function: 6CIT completed        05/22/2023    3:31 PM 05/16/2022    3:42 PM 03/04/2021    2:30 PM  6CIT Screen  What Year? 0 points 0 points 0 points  What month? 0 points 0 points 0 points  What time? 0 points 0 points 0 points  Count back from 20 0 points 0 points 0 points  Months in reverse 2 points 0 points 0 points  Repeat phrase 0 points 0 points 0 points  Total Score 2 points 0 points 0 points    Immunizations Immunization History  Administered Date(s) Administered   Influenza, Seasonal, Injecte, Preservative Fre 09/23/2007, 10/31/2022   Influenza,inj,Quad PF,6+ Mos 10/09/2014, 11/28/2020, 10/18/2021   Pneumococcal Polysaccharide-23 08/18/2007, 10/13/2012    Screening Tests Health Maintenance  Topic Date Due   COVID-19 Vaccine (1) Never done   FOOT EXAM  Never done   OPHTHALMOLOGY EXAM  Never done   Diabetic kidney evaluation - Urine ACR  Never done   Hepatitis C Screening  Never done   DTaP/Tdap/Td (1 - Tdap) Never done   Zoster Vaccines- Shingrix (1 of 2) Never done   Cervical Cancer Screening (HPV/Pap Cotest)  Never done   Colonoscopy  Never done   MAMMOGRAM  Never done   Pneumococcal Vaccine 20-77 Years old (2 of 2 - PCV) 10/13/2013   Lung Cancer Screening  10/10/2018   HEMOGLOBIN A1C  11/05/2022   INFLUENZA VACCINE  08/14/2023   Diabetic kidney evaluation - eGFR measurement  04/09/2024   Medicare Annual Wellness (AWV)  05/21/2024   HIV Screening  Completed   HPV VACCINES  Aged Out   Meningococcal B Vaccine  Aged Out    Health Maintenance  Health Maintenance Due  Topic Date Due   COVID-19 Vaccine (1) Never done   FOOT EXAM  Never done   OPHTHALMOLOGY EXAM  Never done   Diabetic kidney evaluation - Urine ACR  Never done   Hepatitis C Screening  Never done   DTaP/Tdap/Td (1 - Tdap) Never done   Zoster Vaccines-  Shingrix (1 of 2) Never done   Cervical Cancer Screening (HPV/Pap Cotest)  Never done   Colonoscopy  Never done   MAMMOGRAM  Never done   Pneumococcal Vaccine 27-43 Years old (2 of 2 - PCV) 10/13/2013   Lung Cancer Screening  10/10/2018   HEMOGLOBIN A1C  11/05/2022   Health Maintenance Items Addressed: UP TO DATE W/ SHOTS EXCEPT PNA & SHINGRIX; DECLINES MAMMOGRAM; COLONOSCOPY ORDERED   Additional Screening:  Vision Screening: Recommended annual  ophthalmology exams for early detection of glaucoma and other disorders of the eye.  Dental Screening: Recommended annual dental exams for proper oral hygiene  Community Resource Referral / Chronic Care Management: CRR required this visit?  No   CCM required this visit?  No   Plan:    I have personally reviewed and noted the following in the patient's chart:   Medical and social history Use of alcohol , tobacco or illicit drugs  Current medications and supplements including opioid prescriptions. Patient is currently taking opioid prescriptions. Information provided to patient regarding non-opioid alternatives. Patient advised to discuss non-opioid treatment plan with their provider. Functional ability and status Nutritional status Physical activity Advanced directives List of other physicians Hospitalizations, surgeries, and ER visits in previous 12 months Vitals Screenings to include cognitive, depression, and falls Referrals and appointments  In addition, I have reviewed and discussed with patient certain preventive protocols, quality metrics, and best practice recommendations. A written personalized care plan for preventive services as well as general preventive health recommendations were provided to patient.   Pinky Bright, LPN   01/18/1094   After Visit Summary: (MyChart) Due to this being a telephonic visit, the after visit summary with patients personalized plan was offered to patient via MyChart   Notes: COLONOSCOPY  ORDERED

## 2023-05-22 NOTE — Patient Instructions (Signed)
 [name] , Thank you for taking time out of your busy schedule to complete your Annual Wellness Visit with me. I enjoyed our conversation and look forward to speaking with you again next year. I, as well as your care team,  appreciate your ongoing commitment to your health goals. Please review the following plan we discussed and let me know if I can assist you in the future.  Follow up Visits: Next Medicare AWV with our clinical staff: 05/27/24 @ 2:40 PM BY PHONE   Have you seen your provider in the last 6 months (3 months if uncontrolled diabetes)? Yes   Clinician Recommendations:  Aim for 30 minutes of exercise or brisk walking, 6-8 glasses of water , and 5 servings of fruits and vegetables each day. TAKE CARE!      This is a list of the screening recommended for you and due dates:  Health Maintenance  Topic Date Due   COVID-19 Vaccine (1) Never done   Complete foot exam   Never done   Eye exam for diabetics  Never done   Yearly kidney health urinalysis for diabetes  Never done   Hepatitis C Screening  Never done   DTaP/Tdap/Td vaccine (1 - Tdap) Never done   Zoster (Shingles) Vaccine (1 of 2) Never done   Pap with HPV screening  Never done   Colon Cancer Screening  Never done   Mammogram  Never done   Pneumococcal Vaccination (2 of 2 - PCV) 10/13/2013   Screening for Lung Cancer  10/10/2018   Hemoglobin A1C  11/05/2022   Flu Shot  08/14/2023   Yearly kidney function blood test for diabetes  04/09/2024   Medicare Annual Wellness Visit  05/21/2024   HIV Screening  Completed   HPV Vaccine  Aged Out   Meningitis B Vaccine  Aged Out    Advanced directives: (ACP Link)Information on Advanced Care Planning can be found at [place]  Print production planner Health Care Directives Advance Health Care Directives. [email or web address]  Advance Care Planning is important because it:  [x]  Makes sure you receive the medical care that is consistent with your values, goals, and preferences  [x]   It provides guidance to your family and loved ones and reduces their decisional burden about whether or not they are making the right decisions based on your wishes.  Follow the link provided in your after visit summary or read over the paperwork we have mailed to you to help you started getting your Advance Directives in place. If you need assistance in completing these, please reach out to us  so that we can help you!

## 2023-05-27 ENCOUNTER — Telehealth: Payer: Self-pay

## 2023-05-27 DIAGNOSIS — I69351 Hemiplegia and hemiparesis following cerebral infarction affecting right dominant side: Secondary | ICD-10-CM | POA: Diagnosis not present

## 2023-05-27 DIAGNOSIS — Z7951 Long term (current) use of inhaled steroids: Secondary | ICD-10-CM | POA: Diagnosis not present

## 2023-05-27 DIAGNOSIS — J449 Chronic obstructive pulmonary disease, unspecified: Secondary | ICD-10-CM | POA: Diagnosis not present

## 2023-05-27 DIAGNOSIS — Z556 Problems related to health literacy: Secondary | ICD-10-CM | POA: Diagnosis not present

## 2023-05-27 DIAGNOSIS — M86172 Other acute osteomyelitis, left ankle and foot: Secondary | ICD-10-CM | POA: Diagnosis not present

## 2023-05-27 DIAGNOSIS — Z7985 Long-term (current) use of injectable non-insulin antidiabetic drugs: Secondary | ICD-10-CM | POA: Diagnosis not present

## 2023-05-27 DIAGNOSIS — Z4781 Encounter for orthopedic aftercare following surgical amputation: Secondary | ICD-10-CM | POA: Diagnosis not present

## 2023-05-27 DIAGNOSIS — E785 Hyperlipidemia, unspecified: Secondary | ICD-10-CM | POA: Diagnosis not present

## 2023-05-27 DIAGNOSIS — F17200 Nicotine dependence, unspecified, uncomplicated: Secondary | ICD-10-CM | POA: Diagnosis not present

## 2023-05-27 DIAGNOSIS — E1169 Type 2 diabetes mellitus with other specified complication: Secondary | ICD-10-CM | POA: Diagnosis not present

## 2023-05-27 DIAGNOSIS — I10 Essential (primary) hypertension: Secondary | ICD-10-CM | POA: Diagnosis not present

## 2023-05-27 DIAGNOSIS — G8929 Other chronic pain: Secondary | ICD-10-CM | POA: Diagnosis not present

## 2023-05-27 DIAGNOSIS — Z48812 Encounter for surgical aftercare following surgery on the circulatory system: Secondary | ICD-10-CM | POA: Diagnosis not present

## 2023-05-27 DIAGNOSIS — Z89412 Acquired absence of left great toe: Secondary | ICD-10-CM | POA: Diagnosis not present

## 2023-05-27 DIAGNOSIS — G43909 Migraine, unspecified, not intractable, without status migrainosus: Secondary | ICD-10-CM | POA: Diagnosis not present

## 2023-05-27 DIAGNOSIS — Z7902 Long term (current) use of antithrombotics/antiplatelets: Secondary | ICD-10-CM | POA: Diagnosis not present

## 2023-05-27 DIAGNOSIS — E1151 Type 2 diabetes mellitus with diabetic peripheral angiopathy without gangrene: Secondary | ICD-10-CM | POA: Diagnosis not present

## 2023-05-27 DIAGNOSIS — N200 Calculus of kidney: Secondary | ICD-10-CM | POA: Diagnosis not present

## 2023-05-27 NOTE — Telephone Encounter (Signed)
 PT returning call to schedule colonoscopy

## 2023-05-27 NOTE — Telephone Encounter (Signed)
 Patient is still recovering from vascular surgery, and toe amputation.  She would like to schedule her colonoscopy after she has been released from vascular.  She is also taking Plavix .  Medication is written by Dr. Linnell Richardson.  Patient has been advised that I will call her back at the end of June to allow her more time to recover.  Reminder message has been sent to myself.  Thanks,  [employer]

## 2023-05-29 DIAGNOSIS — J449 Chronic obstructive pulmonary disease, unspecified: Secondary | ICD-10-CM | POA: Diagnosis not present

## 2023-05-29 DIAGNOSIS — E785 Hyperlipidemia, unspecified: Secondary | ICD-10-CM | POA: Diagnosis not present

## 2023-05-29 DIAGNOSIS — E1151 Type 2 diabetes mellitus with diabetic peripheral angiopathy without gangrene: Secondary | ICD-10-CM | POA: Diagnosis not present

## 2023-05-29 DIAGNOSIS — Z556 Problems related to health literacy: Secondary | ICD-10-CM | POA: Diagnosis not present

## 2023-05-29 DIAGNOSIS — E1169 Type 2 diabetes mellitus with other specified complication: Secondary | ICD-10-CM | POA: Diagnosis not present

## 2023-05-29 DIAGNOSIS — M86172 Other acute osteomyelitis, left ankle and foot: Secondary | ICD-10-CM | POA: Diagnosis not present

## 2023-05-29 DIAGNOSIS — Z7902 Long term (current) use of antithrombotics/antiplatelets: Secondary | ICD-10-CM | POA: Diagnosis not present

## 2023-05-29 DIAGNOSIS — G8929 Other chronic pain: Secondary | ICD-10-CM | POA: Diagnosis not present

## 2023-05-29 DIAGNOSIS — I10 Essential (primary) hypertension: Secondary | ICD-10-CM | POA: Diagnosis not present

## 2023-05-29 DIAGNOSIS — N200 Calculus of kidney: Secondary | ICD-10-CM | POA: Diagnosis not present

## 2023-05-29 DIAGNOSIS — I69351 Hemiplegia and hemiparesis following cerebral infarction affecting right dominant side: Secondary | ICD-10-CM | POA: Diagnosis not present

## 2023-05-29 DIAGNOSIS — Z48812 Encounter for surgical aftercare following surgery on the circulatory system: Secondary | ICD-10-CM | POA: Diagnosis not present

## 2023-05-29 DIAGNOSIS — Z4781 Encounter for orthopedic aftercare following surgical amputation: Secondary | ICD-10-CM | POA: Diagnosis not present

## 2023-05-29 DIAGNOSIS — Z7985 Long-term (current) use of injectable non-insulin antidiabetic drugs: Secondary | ICD-10-CM | POA: Diagnosis not present

## 2023-05-29 DIAGNOSIS — F17200 Nicotine dependence, unspecified, uncomplicated: Secondary | ICD-10-CM | POA: Diagnosis not present

## 2023-05-29 DIAGNOSIS — Z89412 Acquired absence of left great toe: Secondary | ICD-10-CM | POA: Diagnosis not present

## 2023-05-29 DIAGNOSIS — Z7951 Long term (current) use of inhaled steroids: Secondary | ICD-10-CM | POA: Diagnosis not present

## 2023-05-29 DIAGNOSIS — G43909 Migraine, unspecified, not intractable, without status migrainosus: Secondary | ICD-10-CM | POA: Diagnosis not present

## 2023-06-01 DIAGNOSIS — G43909 Migraine, unspecified, not intractable, without status migrainosus: Secondary | ICD-10-CM | POA: Diagnosis not present

## 2023-06-01 DIAGNOSIS — Z48812 Encounter for surgical aftercare following surgery on the circulatory system: Secondary | ICD-10-CM | POA: Diagnosis not present

## 2023-06-01 DIAGNOSIS — Z7985 Long-term (current) use of injectable non-insulin antidiabetic drugs: Secondary | ICD-10-CM | POA: Diagnosis not present

## 2023-06-01 DIAGNOSIS — E1169 Type 2 diabetes mellitus with other specified complication: Secondary | ICD-10-CM | POA: Diagnosis not present

## 2023-06-01 DIAGNOSIS — I10 Essential (primary) hypertension: Secondary | ICD-10-CM | POA: Diagnosis not present

## 2023-06-01 DIAGNOSIS — I69351 Hemiplegia and hemiparesis following cerebral infarction affecting right dominant side: Secondary | ICD-10-CM | POA: Diagnosis not present

## 2023-06-01 DIAGNOSIS — N200 Calculus of kidney: Secondary | ICD-10-CM | POA: Diagnosis not present

## 2023-06-01 DIAGNOSIS — J449 Chronic obstructive pulmonary disease, unspecified: Secondary | ICD-10-CM | POA: Diagnosis not present

## 2023-06-01 DIAGNOSIS — E1151 Type 2 diabetes mellitus with diabetic peripheral angiopathy without gangrene: Secondary | ICD-10-CM | POA: Diagnosis not present

## 2023-06-01 DIAGNOSIS — Z556 Problems related to health literacy: Secondary | ICD-10-CM | POA: Diagnosis not present

## 2023-06-01 DIAGNOSIS — E785 Hyperlipidemia, unspecified: Secondary | ICD-10-CM | POA: Diagnosis not present

## 2023-06-01 DIAGNOSIS — Z89412 Acquired absence of left great toe: Secondary | ICD-10-CM | POA: Diagnosis not present

## 2023-06-01 DIAGNOSIS — M86172 Other acute osteomyelitis, left ankle and foot: Secondary | ICD-10-CM | POA: Diagnosis not present

## 2023-06-01 DIAGNOSIS — F17200 Nicotine dependence, unspecified, uncomplicated: Secondary | ICD-10-CM | POA: Diagnosis not present

## 2023-06-01 DIAGNOSIS — Z7902 Long term (current) use of antithrombotics/antiplatelets: Secondary | ICD-10-CM | POA: Diagnosis not present

## 2023-06-01 DIAGNOSIS — G8929 Other chronic pain: Secondary | ICD-10-CM | POA: Diagnosis not present

## 2023-06-01 DIAGNOSIS — Z7951 Long term (current) use of inhaled steroids: Secondary | ICD-10-CM | POA: Diagnosis not present

## 2023-06-01 DIAGNOSIS — Z4781 Encounter for orthopedic aftercare following surgical amputation: Secondary | ICD-10-CM | POA: Diagnosis not present

## 2023-06-02 ENCOUNTER — Observation Stay
Admission: EM | Admit: 2023-06-02 | Discharge: 2023-06-04 | Disposition: A | Discharge status: Home Health | Attending: Internal Medicine | Admitting: Internal Medicine

## 2023-06-02 ENCOUNTER — Emergency Department

## 2023-06-02 ENCOUNTER — Encounter (INDEPENDENT_AMBULATORY_CARE_PROVIDER_SITE_OTHER): Payer: Self-pay

## 2023-06-02 ENCOUNTER — Other Ambulatory Visit: Payer: Self-pay

## 2023-06-02 DIAGNOSIS — Z8505 Personal history of malignant neoplasm of liver: Secondary | ICD-10-CM | POA: Insufficient documentation

## 2023-06-02 DIAGNOSIS — I10 Essential (primary) hypertension: Secondary | ICD-10-CM | POA: Diagnosis not present

## 2023-06-02 DIAGNOSIS — F17210 Nicotine dependence, cigarettes, uncomplicated: Secondary | ICD-10-CM | POA: Insufficient documentation

## 2023-06-02 DIAGNOSIS — F32A Depression, unspecified: Secondary | ICD-10-CM | POA: Diagnosis present

## 2023-06-02 DIAGNOSIS — Z7902 Long term (current) use of antithrombotics/antiplatelets: Secondary | ICD-10-CM | POA: Diagnosis not present

## 2023-06-02 DIAGNOSIS — I69398 Other sequelae of cerebral infarction: Secondary | ICD-10-CM | POA: Diagnosis not present

## 2023-06-02 DIAGNOSIS — F419 Anxiety disorder, unspecified: Secondary | ICD-10-CM | POA: Diagnosis not present

## 2023-06-02 DIAGNOSIS — K219 Gastro-esophageal reflux disease without esophagitis: Secondary | ICD-10-CM

## 2023-06-02 DIAGNOSIS — I959 Hypotension, unspecified: Secondary | ICD-10-CM

## 2023-06-02 DIAGNOSIS — I6381 Other cerebral infarction due to occlusion or stenosis of small artery: Secondary | ICD-10-CM | POA: Diagnosis not present

## 2023-06-02 DIAGNOSIS — R531 Weakness: Secondary | ICD-10-CM | POA: Diagnosis not present

## 2023-06-02 DIAGNOSIS — R414 Neurologic neglect syndrome: Secondary | ICD-10-CM

## 2023-06-02 DIAGNOSIS — I639 Cerebral infarction, unspecified: Principal | ICD-10-CM | POA: Diagnosis present

## 2023-06-02 DIAGNOSIS — G8929 Other chronic pain: Secondary | ICD-10-CM | POA: Diagnosis present

## 2023-06-02 DIAGNOSIS — E119 Type 2 diabetes mellitus without complications: Secondary | ICD-10-CM

## 2023-06-02 DIAGNOSIS — R41841 Cognitive communication deficit: Secondary | ICD-10-CM | POA: Insufficient documentation

## 2023-06-02 DIAGNOSIS — L89899 Pressure ulcer of other site, unspecified stage: Secondary | ICD-10-CM | POA: Insufficient documentation

## 2023-06-02 DIAGNOSIS — Z96653 Presence of artificial knee joint, bilateral: Secondary | ICD-10-CM | POA: Insufficient documentation

## 2023-06-02 DIAGNOSIS — J449 Chronic obstructive pulmonary disease, unspecified: Secondary | ICD-10-CM | POA: Diagnosis present

## 2023-06-02 DIAGNOSIS — R270 Ataxia, unspecified: Secondary | ICD-10-CM | POA: Diagnosis not present

## 2023-06-02 DIAGNOSIS — Z7984 Long term (current) use of oral hypoglycemic drugs: Secondary | ICD-10-CM | POA: Diagnosis not present

## 2023-06-02 DIAGNOSIS — Z79899 Other long term (current) drug therapy: Secondary | ICD-10-CM | POA: Diagnosis not present

## 2023-06-02 DIAGNOSIS — I6310 Cerebral infarction due to embolism of unspecified precerebral artery: Secondary | ICD-10-CM

## 2023-06-02 DIAGNOSIS — R410 Disorientation, unspecified: Secondary | ICD-10-CM | POA: Diagnosis not present

## 2023-06-02 DIAGNOSIS — L8990 Pressure ulcer of unspecified site, unspecified stage: Secondary | ICD-10-CM | POA: Insufficient documentation

## 2023-06-02 DIAGNOSIS — R29810 Facial weakness: Secondary | ICD-10-CM | POA: Diagnosis not present

## 2023-06-02 LAB — COMPREHENSIVE METABOLIC PANEL WITH GFR
ALT: 8 U/L (ref 0–44)
AST: 12 U/L — ABNORMAL LOW (ref 15–41)
Albumin: 3.5 g/dL (ref 3.5–5.0)
Alkaline Phosphatase: 52 U/L (ref 38–126)
Anion gap: 11 (ref 5–15)
BUN: 17 mg/dL (ref 6–20)
CO2: 23 mmol/L (ref 22–32)
Calcium: 9 mg/dL (ref 8.9–10.3)
Chloride: 102 mmol/L (ref 98–111)
Creatinine, Ser: 0.7 mg/dL (ref 0.44–1.00)
GFR, Estimated: >60 mL/min (ref >60)
Glucose, Bld: 100 mg/dL — ABNORMAL HIGH (ref 70–99)
Potassium: 3.5 mmol/L (ref 3.5–5.1)
Sodium: 136 mmol/L (ref 135–145)
Total Bilirubin: 0.4 mg/dL (ref 0.0–1.2)
Total Protein: 7.1 g/dL (ref 6.5–8.1)

## 2023-06-02 LAB — CBC
HCT: 42.2 % (ref 36.0–46.0)
HCT: 41.5 % (ref 36.0–46.0)
Hemoglobin: 13.5 g/dL (ref 12.0–15.0)
Hemoglobin: 13.3 g/dL (ref 12.0–15.0)
MCH: 28.7 pg (ref 26.0–34.0)
MCH: 28.7 pg (ref 26.0–34.0)
MCHC: 32 g/dL (ref 30.0–36.0)
MCHC: 32 g/dL (ref 30.0–36.0)
MCV: 89.8 fL (ref 80.0–100.0)
MCV: 89.6 fL (ref 80.0–100.0)
Platelets: 329 10*3/uL (ref 150–400)
Platelets: 316 10*3/uL (ref 150–400)
RBC: 4.7 MIL/uL (ref 3.87–5.11)
RBC: 4.63 MIL/uL (ref 3.87–5.11)
RDW: 13.3 % (ref 11.5–15.5)
RDW: 13.4 % (ref 11.5–15.5)
WBC: 10.3 10*3/uL (ref 4.0–10.5)
WBC: 9.9 10*3/uL (ref 4.0–10.5)
nRBC: 0 % (ref 0.0–0.2)
nRBC: 0 % (ref 0.0–0.2)

## 2023-06-02 LAB — DIFFERENTIAL
Abs Immature Granulocytes: 0.03 10*3/uL (ref 0.00–0.07)
Basophils Absolute: 0.1 10*3/uL (ref 0.0–0.1)
Basophils Relative: 1 %
Eosinophils Absolute: 0.2 10*3/uL (ref 0.0–0.5)
Eosinophils Relative: 2 %
Immature Granulocytes: 0 %
Lymphocytes Relative: 41 %
Lymphs Abs: 4.3 10*3/uL — ABNORMAL HIGH (ref 0.7–4.0)
Monocytes Absolute: 0.8 10*3/uL (ref 0.1–1.0)
Monocytes Relative: 7 %
Neutro Abs: 5 10*3/uL (ref 1.7–7.7)
Neutrophils Relative %: 49 %

## 2023-06-02 LAB — HEMOGLOBIN A1C
Hgb A1c MFr Bld: 5.9 % — ABNORMAL HIGH (ref 4.8–5.6)
Mean Plasma Glucose: 122.63 mg/dL

## 2023-06-02 LAB — PROTIME-INR
INR: 1 (ref 0.8–1.2)
Prothrombin Time: 13.6 s (ref 11.4–15.2)

## 2023-06-02 LAB — CREATININE, SERUM
Creatinine, Ser: 0.72 mg/dL (ref 0.44–1.00)
GFR, Estimated: >60 mL/min (ref >60)

## 2023-06-02 LAB — CBG MONITORING, ED: Glucose-Capillary: 115 mg/dL — ABNORMAL HIGH (ref 70–99)

## 2023-06-02 LAB — APTT: aPTT: 29 s (ref 24–36)

## 2023-06-02 LAB — TSH: TSH: 2.888 u[IU]/mL (ref 0.350–4.500)

## 2023-06-02 LAB — ETHANOL: Alcohol, Ethyl (B): <15 mg/dL (ref <15)

## 2023-06-02 MED ORDER — STROKE: EARLY STAGES OF RECOVERY BOOK: Freq: Once | Status: AC

## 2023-06-02 MED ORDER — SENNOSIDES-DOCUSATE SODIUM 8.6-50 MG PO TABS
1.0000 | ORAL_TABLET | Freq: Every evening | ORAL | Status: DC | PRN
  Administered 2023-06-02: 1 via ORAL
  Filled 2023-06-02: qty 1

## 2023-06-02 MED ORDER — ASPIRIN 81 MG PO TBEC
81.0000 mg | DELAYED_RELEASE_TABLET | Freq: Every day | ORAL | Status: DC
  Administered 2023-06-03 – 2023-06-04 (×2): 81 mg via ORAL
  Filled 2023-06-02 (×2): qty 1

## 2023-06-02 MED ORDER — SODIUM CHLORIDE 0.9% FLUSH: 3.0000 mL | Freq: Once | INTRAVENOUS | Status: DC

## 2023-06-02 MED ORDER — CITALOPRAM HYDROBROMIDE 20 MG PO TABS
40.0000 mg | ORAL_TABLET | Freq: Every day | ORAL | Status: DC
  Administered 2023-06-02 – 2023-06-04 (×3): 40 mg via ORAL
  Filled 2023-06-02 (×4): qty 2

## 2023-06-02 MED ORDER — OXYCODONE-ACETAMINOPHEN 10-325 MG PO TABS: 1.0000 | ORAL_TABLET | ORAL | Status: DC | PRN

## 2023-06-02 MED ORDER — BUDESON-GLYCOPYRROL-FORMOTEROL 160-9-4.8 MCG/ACT IN AERO
2.0000 | INHALATION_SPRAY | Freq: Two times a day (BID) | RESPIRATORY_TRACT | Status: DC
  Administered 2023-06-03 – 2023-06-04 (×2): 2 via RESPIRATORY_TRACT
  Filled 2023-06-02: qty 5.9

## 2023-06-02 MED ORDER — ATORVASTATIN CALCIUM 20 MG PO TABS
40.0000 mg | ORAL_TABLET | Freq: Every day | ORAL | Status: DC
  Administered 2023-06-02 – 2023-06-03 (×2): 40 mg via ORAL
  Filled 2023-06-02 (×2): qty 2

## 2023-06-02 MED ORDER — ACETAMINOPHEN 325 MG PO TABS
650.0000 mg | ORAL_TABLET | ORAL | Status: DC | PRN
  Administered 2023-06-02: 650 mg via ORAL
  Filled 2023-06-02: qty 2

## 2023-06-02 MED ORDER — ONDANSETRON 4 MG PO TBDP: 4.0000 mg | ORAL_TABLET | Freq: Three times a day (TID) | ORAL | Status: DC | PRN

## 2023-06-02 MED ORDER — OXYCODONE HCL 5 MG PO TABS
5.0000 mg | ORAL_TABLET | ORAL | Status: DC | PRN
  Administered 2023-06-02 – 2023-06-04 (×3): 5 mg via ORAL
  Filled 2023-06-02 (×3): qty 1

## 2023-06-02 MED ORDER — CLOPIDOGREL BISULFATE 75 MG PO TABS
75.0000 mg | ORAL_TABLET | Freq: Every day | ORAL | Status: DC
  Administered 2023-06-02 – 2023-06-04 (×3): 75 mg via ORAL
  Filled 2023-06-02 (×4): qty 1

## 2023-06-02 MED ORDER — ASPIRIN 325 MG PO TABS
325.0000 mg | ORAL_TABLET | Freq: Once | ORAL | Status: AC
  Administered 2023-06-02: 325 mg via ORAL
  Filled 2023-06-02: qty 1

## 2023-06-02 MED ORDER — ALBUTEROL SULFATE (2.5 MG/3ML) 0.083% IN NEBU
3.0000 mL | INHALATION_SOLUTION | RESPIRATORY_TRACT | Status: DC | PRN
Start: 2023-06-02 — End: 2023-06-04

## 2023-06-02 MED ORDER — ACETAMINOPHEN 650 MG RE SUPP: 650.0000 mg | RECTAL | Status: DC | PRN

## 2023-06-02 MED ORDER — BUPROPION HCL ER (XL) 150 MG PO TB24
150.0000 mg | ORAL_TABLET | Freq: Every day | ORAL | Status: DC
  Administered 2023-06-02 – 2023-06-04 (×2): 150 mg via ORAL
  Filled 2023-06-02 (×3): qty 1

## 2023-06-02 MED ORDER — CLOPIDOGREL BISULFATE 75 MG PO TABS: 75.0000 mg | ORAL_TABLET | Freq: Every day | ORAL | Status: DC

## 2023-06-02 MED ORDER — OXYCODONE-ACETAMINOPHEN 5-325 MG PO TABS
1.0000 | ORAL_TABLET | ORAL | Status: DC | PRN
  Administered 2023-06-02 – 2023-06-03 (×3): 1 via ORAL
  Filled 2023-06-02 (×4): qty 1

## 2023-06-02 MED ORDER — MIDODRINE HCL 5 MG PO TABS
5.0000 mg | ORAL_TABLET | Freq: Three times a day (TID) | ORAL | Status: DC
  Administered 2023-06-02 – 2023-06-04 (×5): 5 mg via ORAL
  Filled 2023-06-02 (×6): qty 1

## 2023-06-02 MED ORDER — TOPIRAMATE 100 MG PO TABS
100.0000 mg | ORAL_TABLET | Freq: Two times a day (BID) | ORAL | Status: DC
  Administered 2023-06-02 – 2023-06-04 (×4): 100 mg via ORAL
  Filled 2023-06-02 (×4): qty 1

## 2023-06-02 MED ORDER — SODIUM CHLORIDE 0.9 % IV SOLN: INTRAVENOUS | Status: DC

## 2023-06-02 MED ORDER — ACETAMINOPHEN 160 MG/5ML PO SOLN: 650.0000 mg | ORAL | Status: DC | PRN

## 2023-06-02 MED ORDER — PANTOPRAZOLE SODIUM 40 MG PO TBEC
40.0000 mg | DELAYED_RELEASE_TABLET | Freq: Every day | ORAL | Status: DC
  Administered 2023-06-02 – 2023-06-03 (×2): 40 mg via ORAL
  Filled 2023-06-02 (×3): qty 1

## 2023-06-02 MED ORDER — ENOXAPARIN SODIUM 60 MG/0.6ML IJ SOSY
0.5000 mg/kg | PREFILLED_SYRINGE | INTRAMUSCULAR | Status: DC
  Administered 2023-06-02 – 2023-06-03 (×2): 50 mg via SUBCUTANEOUS
  Filled 2023-06-02 (×3): qty 0.6

## 2023-06-02 MED ORDER — METHOCARBAMOL 500 MG PO TABS
500.0000 mg | ORAL_TABLET | Freq: Three times a day (TID) | ORAL | Status: DC
  Administered 2023-06-02 – 2023-06-04 (×5): 500 mg via ORAL
  Filled 2023-06-02 (×5): qty 1

## 2023-06-02 NOTE — Code Documentation (Signed)
 Stroke Response Nurse Documentation Code Documentation  [name] is a 58 y.o. female arriving to [facility] via [facility] on 06/02/2023 with past medical hx of current smoker, HTN, HLD, DM, PAD, previous stroke with right sided deficits. On clopidogrel  75 mg daily. Code stroke was activated by EMS.   Patient from home where she was LKW at 1200 and now complaining of right sided weakness. Per primary RN- patient was at home when she had sudden onset headache, right sided facial droop, right weakness. Pt did have a fall. Right arm bandaged by EMS.  Stroke team at the bedside on patient arrival. Labs drawn and patient cleared for CT by Dr. Cam Cava. Patient to CT with team. NIHSS 9, see documentation for details and code stroke times. Patient with disoriented, right hemianopia, right facial droop, right leg weakness, right decreased sensation, dysarthria , and Sensory  neglect on exam. Pt reports decreased sensation right sided is not at her baseline. The following imaging was completed:  CT Head. Patient is not a candidate for IV Thrombolytic due to patient refusal. Patient verbalizes to neurologist in CT she would "refuse it anyway" if symptoms worsened. Patient is not a candidate for IR due to mRS of 4, per MD.   Care Plan: every 2 hour NIHSS and vital signs, per MD.   Process Delays Noted: none  Bedside handoff with ED RN Jacqlyn Matas.    Gareld June  Stroke Response RN

## 2023-06-02 NOTE — Consult Note (Addendum)
 NEUROLOGY CONSULT NOTE   Date of service: Jun 02, 2023 Patient Name: [name] MRN:  [id number] DOB:  03-18-1965 Chief Complaint: R arm incoordination Requesting Provider: Charlesetta Connors, DO  History of Present Illness  [name] is a 58 y.o. female with hx of hypertension, hyperlipidemia, diabetes, peripheral artery disease, previous stroke with right-sided deficits who presented to the ED for acute worsening of right sided weakness and new right sided sensory deficit. LKW 1205. NIHSS = 9 in ED for baseline R sided weakness and new R sensory deficit, R visual field deficit, RUE ataxia, and R sided visual and hemisensory neglect. When further asked about her symptoms she reports that she does not actually feel much weaker than usual on the R but that she is having trouble controlling her R hand more than usual. Head CT showed no acute process. Patient is on plavix  at home but no anticoagulation. Given the prominent ataxia in her RUE and being R hand dominant TNK was offered to patient but after discussion of risks, benefits, and alternatives patient declined and further stated that her "right side was shot anyway." I recommended that she stay in the ED until she was outside of the TNK window at 4:35 PM however patient declined and stated that no matter what happened if her symptoms got worse then she would not want TNK at that point either therefore patient was admitted for stroke workup.  CTA was not performed as part of the stroke code because patient has an MRS of 4 and would therefore not be eligible for intervention even if there was a LVO present, particularly with mild/moderate symptoms.  LKW: 1205 Modified rankin score: 4-Needs assistance to walk and tend to bodily needs IV Thrombolysis: no, patient declined  NIHSS components Score: Comment  1a Level of Conscious 0[x]  1[]  2[]  3[]      1b LOC Questions 0[x]  1[]  2[]       1c LOC Commands 0[x]  1[]  2[]       2 Best Gaze 0[x]  1[]  2[]        3 Visual 0[]  1[]  2[x]  3[]      4 Facial Palsy 0[]  1[x]  2[]  3[]      5a Motor Arm - left 0[x]  1[]  2[]  3[]  4[]  UN[]    5b Motor Arm - Right 0[x]  1[]  2[]  3[]  4[]  UN[]    6a Motor Leg - Left 0[x]  1[]  2[]  3[]  4[]  UN[]    6b Motor Leg - Right 0[]  1[x]  2[]  3[]  4[]  UN[]    7 Limb Ataxia 0[]  1[x]  2[]  UN[]      8 Sensory 0[]  1[x]  2[]  UN[]      9 Best Language 0[x]  1[]  2[]  3[]      10 Dysarthria 0[]  1[x]  2[]  UN[]      11 Extinct. and Inattention 0[]  1[]  2[x]       TOTAL:  9     ROS  Comprehensive ROS performed and pertinent positives documented in HPI   Past History   Past Medical History:  Diagnosis Date   Anxiety    Cancer (HCC)    a spot on liver and treated    Complication of anesthesia    restless,easily upset   COPD (chronic obstructive pulmonary disease) (HCC)    Depression    Diabetes mellitus without complication (HCC)    Diverticulitis    Fatty liver    GERD (gastroesophageal reflux disease)    Headache(784.0)    migraines   History of kidney stones    Hyperlipidemia    Hypertension  PAD (peripheral artery disease) (HCC)    Pneumonia    Restless    Stroke [facility])     Past Surgical History:  Procedure Laterality Date   ABDOMINAL HYSTERECTOMY     ACHILLES TENDON LENGTHENING Right 08/18/2017   Procedure: ACHILLES TENDON LENGTHENING;  Surgeon: Hardy Lia, MD;  Location: [facility] OR;  Service: Orthopedics;  Laterality: Right;   AMPUTATION TOE Left 04/02/2023   Procedure: AMPUTATION, TOE;  Surgeon: Evertt Hoe, DPM;  Location: [facility] ORS;  Service: Orthopedics/Podiatry;  Laterality: Left;  LEFT GREAT TOE   ANTERIOR CERVICAL DECOMP/DISCECTOMY FUSION N/A 03/11/2012   Procedure: ANTERIOR CERVICAL DECOMPRESSION/DISCECTOMY FUSION 1 LEVEL;  Surgeon: Elder Greening, MD;  Location: [facility] NEURO ORS;  Service: Neurosurgery;  Laterality: N/A;  Cervical five-six anterior cervical decompression with fusion interbody prothesis plating and bonegraft   BACK SURGERY     ENDARTERECTOMY FEMORAL  Left 04/01/2023   Procedure: ENDARTERECTOMY, FEMORAL;  Surgeon: Jackquelyn Mass, MD;  Location: [facility] ORS;  Service: Vascular;  Laterality: Left;  with Popiteal stent placment   EXTERNAL FIXATION LEG Right 08/10/2017   Procedure: EXTERNAL FIXATION ANKLE;  Surgeon: Arlyne Lame, MD;  Location: [facility] ORS;  Service: Orthopedics;  Laterality: Right;   EXTERNAL FIXATION REMOVAL Right 08/18/2017   Procedure: REMOVAL EXTERNAL FIXATION LEG;  Surgeon: Hardy Lia, MD;  Location: [facility] OR;  Service: Orthopedics;  Laterality: Right;   EYE SURGERY     HERNIA REPAIR     IR FL GUIDED LOC OF NEEDLE/CATH TIP FOR SPINAL INJECTION LT  12/09/2021   IR FL GUIDED LOC OF NEEDLE/CATH TIP FOR SPINAL INJECTION RT  12/09/2021   JOINT REPLACEMENT     bil knees   LOWER EXTREMITY ANGIOGRAPHY Left 03/27/2023   Procedure: Lower Extremity Angiography;  Surgeon: Jackquelyn Mass, MD;  Location: [facility] INVASIVE CV LAB;  Service: Cardiovascular;  Laterality: Left;   ORIF ANKLE FRACTURE Right 08/18/2017   Procedure: OPEN REDUCTION INTERNAL FIXATION (ORIF) ANKLE FRACTURE;  Surgeon: Hardy Lia, MD;  Location: [facility] OR;  Service: Orthopedics;  Laterality: Right;   REPLACEMENT TOTAL KNEE BILATERAL      Family History: Family History  Problem Relation Age of Onset   Diabetes Mother    Hypertension Mother    Hypertension Father     Social History  reports that she has been smoking cigarettes. She has a 20 pack-year smoking history. She has never used smokeless tobacco. She reports current drug use. Drugs: Marijuana and Oxycodone . She reports that she does not drink alcohol .  Allergies  Allergen Reactions   Tramadol Nausea And Vomiting and Hypertension   Augmentin [Amoxicillin-Pot Clavulanate] Rash    Tolerated Zosyn  03/26/23    Medications   Current Facility-Administered Medications:    [START ON 06/03/2023]  stroke: early stages of recovery book, , Does not apply, Once, Krugh, Marissa C, DO   0.9 %  sodium chloride   infusion, , Intravenous, Continuous, Krugh, Marissa C, DO, Last Rate: 40 mL/hr at 06/02/23 1849, Infusion Verify at 06/02/23 1849   acetaminophen  (TYLENOL ) tablet 650 mg, 650 mg, Oral, Q4H PRN, 650 mg at 06/02/23 1649 **OR** acetaminophen  (TYLENOL ) 160 MG/5ML solution 650 mg, 650 mg, Per Tube, Q4H PRN **OR** acetaminophen  (TYLENOL ) suppository 650 mg, 650 mg, Rectal, Q4H PRN, Krugh, Marissa C, DO   albuterol  (PROVENTIL ) (2.5 MG/3ML) 0.083% nebulizer solution 3 mL, 3 mL, Nebulization, Q4H PRN, Krugh, Marissa C, DO   [COMPLETED] aspirin  tablet 325 mg, 325 mg, Oral, Once, 325 mg at 06/02/23 1833 **FOLLOWED BY** [  START ON 06/03/2023] aspirin  EC tablet 81 mg, 81 mg, Oral, Daily, Eleni Griffin, MD   atorvastatin  (LIPITOR) tablet 40 mg, 40 mg, Oral, QHS, Krugh, Marissa C, DO   [START ON 06/03/2023] budesonide-glycopyrrolate -formoterol (BREZTRI ) 160-9-4.8 MCG/ACT inhaler 2 puff, 2 puff, Inhalation, BID, Krugh, Marissa C, DO   buPROPion  (WELLBUTRIN  XL) 24 hr tablet 150 mg, 150 mg, Oral, Daily, Krugh, Marissa C, DO, 150 mg at 2023/06/26 1833   citalopram  (CELEXA ) tablet 40 mg, 40 mg, Oral, Daily, Krugh, Marissa C, DO, 40 mg at 06-26-23 1827   clopidogrel  (PLAVIX ) tablet 75 mg, 75 mg, Oral, Daily, Eleni Griffin, MD, 75 mg at 2023-06-26 1827   enoxaparin  (LOVENOX ) injection 50 mg, 0.5 mg/kg, Subcutaneous, Q24H, Krugh, Marissa C, DO, 50 mg at 06-26-2023 1827   methocarbamol  (ROBAXIN ) tablet 500 mg, 500 mg, Oral, TID, Krugh, Marissa C, DO   midodrine  (PROAMATINE ) tablet 5 mg, 5 mg, Oral, TID WC, Krugh, Marissa C, DO, 5 mg at 2023/06/26 1827   ondansetron  (ZOFRAN -ODT) disintegrating tablet 4 mg, 4 mg, Oral, Q8H PRN, Krugh, Marissa C, DO   oxyCODONE  (Oxy IR/ROXICODONE ) immediate release tablet 5 mg, 5 mg, Oral, Q4H PRN, 5 mg at June 26, 2023 1826 **AND** oxyCODONE -acetaminophen  (PERCOCET/ROXICET) 5-325 MG per tablet 1 tablet, 1 tablet, Oral, Q4H PRN, Page Boast, RPH   pantoprazole  (PROTONIX ) EC tablet 40 mg, 40 mg, Oral,  q1800, Krugh, Marissa C, DO, 40 mg at 06-26-23 1826   senna-docusate (Senokot-S) tablet 1 tablet, 1 tablet, Oral, QHS PRN, Krugh, Marissa C, DO, 1 tablet at 06-26-23 1827   sodium chloride  flush (NS) 0.9 % injection 3 mL, 3 mL, Intravenous, Once, Krugh, Marissa C, DO   topiramate  (TOPAMAX ) tablet 100 mg, 100 mg, Oral, BID, Krugh, Marissa C, DO  Vitals   Vitals:   Jun 26, 2023 1324 06/26/2023 1400 06/26/2023 1430 06/26/23 1801  BP:  109/77 106/71 114/70  Pulse:  87 85 81  Resp:  16 14 18   Temp:    98.6 F (37 C)  TempSrc:    Oral  SpO2:  99% 99% 100%  Weight: 97.8 kg     Height: 5\' 6"  (1.676 m)       Body mass index is 34.78 kg/m.  Physical Exam   Gen: patient lying in bed, NAD CV: extremities appear well-perfused Resp: normal WOB  Neurologic exam MS: alert, oriented x4, follows commands Speech: mild dysarthria, no aphasia CN: PERRL, R homonymous hemianopsia (chronic per patient), EOMI, sensation intact, R facial droop, hearing intact to voice Motor: no drift BUE although she is unable to abduct her RUE above 90 degrees. RLE with drift but not to bed, LLE no drift. Sensory: sensory deficit RUE, visual and sensory hemineglect on R Reflexes: 2+ symm with toes down bilat Coordination: RUE ataxia on FNF Gait: deferred  Labs/Imaging/Neurodiagnostic studies   CBC:  Recent Labs  Lab Jun 26, 2023 1255 June 26, 2023 1750  WBC 10.3 9.9  NEUTROABS 5.0  --   HGB 13.5 13.3  HCT 42.2 41.5  MCV 89.8 89.6  PLT 329 316   Basic Metabolic Panel:  Lab Results  Component Value Date   NA 136 Jun 26, 2023   K 3.5 2023/06/26   CO2 23 26-Jun-2023   GLUCOSE 100 (H) June 26, 2023   BUN 17 06/26/2023   CREATININE 0.72 2023/06/26   CALCIUM  9.0 06-26-2023   GFRNONAA >60 Jun 26, 2023   GFRAA >60 08/18/2017   Lipid Panel:  Lab Results  Component Value Date   LDLCALC 67 04/07/2017   HgbA1c:  Lab  Results  Component Value Date   HGBA1C 5.2 05/06/2022   Urine Drug Screen:     Component Value Date/Time    LABOPIA NONE DETECTED 12/08/2021 2358   LABOPIA POSITIVE (A) 08/18/2017 0703   COCAINSCRNUR NONE DETECTED 12/08/2021 2358   LABBENZ NONE DETECTED 12/08/2021 2358   LABBENZ POSITIVE (A) 08/18/2017 0703   AMPHETMU NONE DETECTED 12/08/2021 2358   AMPHETMU NONE DETECTED 08/18/2017 0703   THCU POSITIVE (A) 12/08/2021 2358   THCU POSITIVE (A) 08/18/2017 0703   LABBARB NONE DETECTED 12/08/2021 2358   LABBARB NONE DETECTED 08/18/2017 0703    Alcohol  Level     Component Value Date/Time   ETH <15 06/02/2023 1255   INR  Lab Results  Component Value Date   INR 1.0 06/02/2023   APTT  Lab Results  Component Value Date   APTT 29 06/02/2023   AED levels: No results found for: "PHENYTOIN", "ZONISAMIDE", "LAMOTRIGINE", "LEVETIRACETA"  CT Head without contrast(Personally reviewed): No acute process   ASSESSMENT   RAAHI KORBER is a 58 y.o. female with hx of hypertension, hyperlipidemia, diabetes, peripheral artery disease, previous stroke with right-sided deficits who presented to the ED for new RUE ataxia and R visual and sensory hemineglect. She was offered TNK but declined.  RECOMMENDATIONS   - Admit for stroke workup - Permissive HTN x48 hrs from sx onset or until stroke ruled out by MRI goal BP <220/120. PRN labetalol  or hydralazine  if BP above these parameters. Avoid oral antihypertensives. - MRI brain wo contrast - CTA or MRA H&N - TTE w/ bubble - Check A1c and LDL + add statin per guidelines - ASA 81mg  daily + plavix  75mg  daily x21 days f/b plavix  75mg  daily monotherapy after that - q4 hr neuro checks - STAT head CT for any change in neuro exam - Tele - PT/OT/SLP - Stroke education - Smoking cessation counseling - Amb referral to neurology upon discharge  - Neurology will continue to follow  ______________________________________________________________________    Signed, Eleni Griffin, MD [facility]

## 2023-06-02 NOTE — Progress Notes (Signed)
 Pt was new to floor getting acclimated to floor- will return at better time with paperwork for HCPOA. If not able to complete this evening we will follow up in morning

## 2023-06-02 NOTE — Progress Notes (Signed)
 PHARMACIST - PHYSICIAN COMMUNICATION  CONCERNING:  Enoxaparin  (Lovenox ) for DVT Prophylaxis    RECOMMENDATION: Patient was prescribed enoxaprin 40mg  q24 hours for VTE prophylaxis.   Filed Weights   06/02/23 1324  Weight: 97.8 kg (215 lb 8 oz)    Body mass index is 34.78 kg/m.  Estimated Creatinine Clearance: 89.3 mL/min (by C-G formula based on SCr of 0.7 mg/dL).   Based on [facility] policy patient is candidate for enoxaparin  0.5mg /kg TBW SQ every 24 hours based on BMI being >30.  DESCRIPTION: Pharmacy has adjusted enoxaparin  dose per [facility] policy.  Patient is now receiving enoxaparin  50 mg every 24 hours    Ananias Balls, PharmD Clinical Pharmacist  06/02/2023 4:00 PM

## 2023-06-02 NOTE — ED Notes (Signed)
 Called CCMD for central monitoring

## 2023-06-02 NOTE — ED Triage Notes (Signed)
 Pt presents to the ED via [facility] from home. Pt had a sudden onset of right sided weakness and facial droop and a HA. LKW 1200. Sx onset 1205. Pt remained A&Ox4 the entire time per EMS and HA had improved prior to arrival to ED.   Pt reports generalized pain all over body (9/10). Pt states that she has chronic pain and that it is time for her pain medication

## 2023-06-02 NOTE — Progress Notes (Signed)
 CODE STROKE- PHARMACY COMMUNICATION   Time CODE STROKE called/page received: [id number]  Time response to CODE STROKE was made (in person or via phone): Immediately  Time Stroke Kit retrieved from [employer] (only if needed): Patient refused TNK  Name of Provider/Nurse contacted: Dr. Doretta Gant  Past Medical History:  Diagnosis Date   Anxiety    Cancer (HCC)    a spot on liver and treated    Complication of anesthesia    restless,easily upset   COPD (chronic obstructive pulmonary disease) (HCC)    Depression    Diabetes mellitus without complication (HCC)    Diverticulitis    Fatty liver    GERD (gastroesophageal reflux disease)    Headache(784.0)    migraines   History of kidney stones    Hyperlipidemia    Hypertension    PAD (peripheral artery disease) (HCC)    Pneumonia    Restless    Stroke [facility])    Page Boast 06/02/2023  1:26 PM

## 2023-06-02 NOTE — H&P (Signed)
 History and Physical    Patient: [name]:[id number] DOB: 1965-04-18 DOA: 06/02/2023 DOS: the patient was seen and examined on 06/02/2023 PCP: Raina Bunting, DO  Patient coming from: Home    Chief Complaint:  Chief Complaint  Patient presents with   Code Stroke   HPI: [name] is a 58 y.o. female with medical history significant of  current smoker, HTN, HLD, DM, PAD, previous stroke with right sided deficits, clopidogrel  75 mg daily presenting to the ED for evaluation given acute worsening of right sided weakness with  new loss of sensation in the right upper and lower extremities, dysarthria, right facial droop, disorientation, and headache that started at 12:05.  At the time of my evaluation, patient's symptoms largely improved.  She is nearly back to her baseline mental status besides some decreased sensation in the right upper and lower extremities, but is improved from prior  Code stroke was activated and TNK recommended, however patient declined.  CT head is pending.  Admission requested for completion of CVA workup.   Review of Systems: Review of Systems  Constitutional:  Negative for chills and fever.  Eyes:  Negative for blurred vision and double vision.  Respiratory:  Negative for cough, sputum production and shortness of breath.   Cardiovascular:  Negative for chest pain, palpitations and leg swelling.  Gastrointestinal:  Negative for constipation, diarrhea, nausea and vomiting.  Genitourinary:  Negative for dysuria and frequency.  Musculoskeletal:  Positive for back pain (chronic).  Neurological:  Positive for sensory change and focal weakness (chronic right sided). Negative for dizziness, tingling, tremors, seizures, loss of consciousness and headaches.    Past Medical History:  Diagnosis Date   Anxiety    Cancer (HCC)    a spot on liver and treated    Complication of anesthesia    restless,easily upset   COPD (chronic obstructive pulmonary  disease) (HCC)    Depression    Diabetes mellitus without complication (HCC)    Diverticulitis    Fatty liver    GERD (gastroesophageal reflux disease)    Headache(784.0)    migraines   History of kidney stones    Hyperlipidemia    Hypertension    PAD (peripheral artery disease) (HCC)    Pneumonia    Restless    Stroke [facility])    Past Surgical History:  Procedure Laterality Date   ABDOMINAL HYSTERECTOMY     ACHILLES TENDON LENGTHENING Right 08/18/2017   Procedure: ACHILLES TENDON LENGTHENING;  Surgeon: Hardy Lia, MD;  Location: [facility] OR;  Service: Orthopedics;  Laterality: Right;   AMPUTATION TOE Left 04/02/2023   Procedure: AMPUTATION, TOE;  Surgeon: Evertt Hoe, DPM;  Location: [facility] ORS;  Service: Orthopedics/Podiatry;  Laterality: Left;  LEFT GREAT TOE   ANTERIOR CERVICAL DECOMP/DISCECTOMY FUSION N/A 03/11/2012   Procedure: ANTERIOR CERVICAL DECOMPRESSION/DISCECTOMY FUSION 1 LEVEL;  Surgeon: Elder Greening, MD;  Location: [facility] NEURO ORS;  Service: Neurosurgery;  Laterality: N/A;  Cervical five-six anterior cervical decompression with fusion interbody prothesis plating and bonegraft   BACK SURGERY     ENDARTERECTOMY FEMORAL Left 04/01/2023   Procedure: ENDARTERECTOMY, FEMORAL;  Surgeon: Jackquelyn Mass, MD;  Location: [facility] ORS;  Service: Vascular;  Laterality: Left;  with Popiteal stent placment   EXTERNAL FIXATION LEG Right 08/10/2017   Procedure: EXTERNAL FIXATION ANKLE;  Surgeon: Arlyne Lame, MD;  Location: [facility] ORS;  Service: Orthopedics;  Laterality: Right;   EXTERNAL FIXATION REMOVAL Right 08/18/2017   Procedure: REMOVAL EXTERNAL FIXATION  LEG;  Surgeon: Hardy Lia, MD;  Location: [facility] OR;  Service: Orthopedics;  Laterality: Right;   EYE SURGERY     HERNIA REPAIR     IR FL GUIDED LOC OF NEEDLE/CATH TIP FOR SPINAL INJECTION LT  12/09/2021   IR FL GUIDED LOC OF NEEDLE/CATH TIP FOR SPINAL INJECTION RT  12/09/2021   JOINT REPLACEMENT     bil knees   LOWER  EXTREMITY ANGIOGRAPHY Left 03/27/2023   Procedure: Lower Extremity Angiography;  Surgeon: Jackquelyn Mass, MD;  Location: [facility] INVASIVE CV LAB;  Service: Cardiovascular;  Laterality: Left;   ORIF ANKLE FRACTURE Right 08/18/2017   Procedure: OPEN REDUCTION INTERNAL FIXATION (ORIF) ANKLE FRACTURE;  Surgeon: Hardy Lia, MD;  Location: [facility] OR;  Service: Orthopedics;  Laterality: Right;   REPLACEMENT TOTAL KNEE BILATERAL     Social History:  reports that she has been smoking cigarettes. She has a 20 pack-year smoking history. She has never used smokeless tobacco. She reports current drug use. Drugs: Marijuana and Oxycodone . She reports that she does not drink alcohol .  Allergies  Allergen Reactions   Tramadol Nausea And Vomiting and Hypertension   Augmentin [Amoxicillin-Pot Clavulanate] Rash    Tolerated Zosyn  03/26/23    Family History  Problem Relation Age of Onset   Diabetes Mother    Hypertension Mother    Hypertension Father     Prior to Admission medications   Medication Sig Start Date End Date Taking? Authorizing Provider  albuterol  (VENTOLIN  HFA) 108 (90 Base) MCG/ACT inhaler INHALE 2 INHALATIONS BY MOUTH  EVERY 4 HOURS AS NEEDED FOR  WHEEZING OR FOR SHORTNESS OF  BREATH 12/30/22   Karamalegos, Kayleen Party, DO  atorvastatin  (LIPITOR) 40 MG tablet TAKE 1 TABLET BY MOUTH AT  BEDTIME 01/08/23   Karamalegos, Kayleen Party, DO  azelastine  (ASTELIN ) 0.1 % nasal spray Place 2 sprays into both nostrils 2 (two) times daily. 10/25/21   Karamalegos, Kayleen Party, DO  buPROPion  (WELLBUTRIN  XL) 150 MG 24 hr tablet Take 1 tablet (150 mg total) by mouth daily. 12/25/22   Karamalegos, Kayleen Party, DO  celecoxib (CELEBREX) 100 MG capsule Take 100 mg by mouth daily. Patient not taking: Reported on 05/22/2023 01/23/23   [provider]  ciprofloxacin  (CIPRO ) 500 MG tablet Take 500 mg by mouth 2 (two) times daily. Patient not taking: Reported on 05/22/2023 03/16/23   [provider]   citalopram  (CELEXA ) 40 MG tablet TAKE 1 TABLET BY MOUTH DAILY 03/10/23   Raina Bunting, DO  clopidogrel  (PLAVIX ) 75 MG tablet TAKE ONE TABLET BY MOUTH DAILY AT 9AM 06/27/22   Karamalegos, Kayleen Party, DO  cyclobenzaprine  (FLEXERIL ) 10 MG tablet Take 10 mg by mouth 3 (three) times daily.    [provider]  doxycycline (MONODOX) 100 MG capsule Take 100 mg by mouth 2 (two) times daily. Patient not taking: Reported on 05/22/2023 03/16/23   [provider]  fluconazole  (DIFLUCAN ) 150 MG tablet Take one tablet by mouth on Day 1. Repeat dose 2nd tablet on Day 3. Patient not taking: Reported on 05/22/2023 04/13/23   Raina Bunting, DO  fluticasone  (FLONASE ) 50 MCG/ACT nasal spray Place 2 sprays into both nostrils daily. Use for 4-6 weeks then stop and use seasonally or as needed. 03/02/23   Karamalegos, Kayleen Party, DO  gabapentin  (NEURONTIN ) 600 MG tablet TAKE 1 TABLET BY MOUTH 4 TIMES  DAILY 09/23/22   Romeo Co, Kayleen Party, DO  leptospermum manuka honey (MEDIHONEY) gel Apply 1 Application topically daily. 04/28/23  Brown, Fallon E, NP  metFORMIN  (GLUCOPHAGE ) 1000 MG tablet TAKE 1 TABLET BY MOUTH TWICE  DAILY WITH MEALS Patient not taking: Reported on 03/02/2023 09/23/22   Raina Bunting, DO  methocarbamol  (ROBAXIN ) 500 MG tablet Take 500 mg by mouth 3 (three) times daily. Patient not taking: Reported on 05/22/2023 04/07/23   [provider]  midodrine  (PROAMATINE ) 5 MG tablet Take 1 tablet (5 mg total) by mouth 3 (three) times daily with meals. 10/20/22   Constancia Delton, MD  MOUNJARO  10 MG/0.5ML Pen INJECT SUBCUTANEOUSLY 10 MG ONCE WEEKLY 09/23/22   Karamalegos, Alexander J, DO  mupirocin  ointment (BACTROBAN ) 2 % Apply 1 Application topically 2 (two) times daily. For 10 days 03/09/23   Raina Bunting, DO  nicotine  (NICODERM CQ  - DOSED IN MG/24 HOURS) 14 mg/24hr patch Place onto the skin. Patient not taking: Reported on 04/29/2023 03/16/23    [provider]  nystatin  (MYCOSTATIN /NYSTOP ) powder APPLY 1 APPLICATION TOPICALLY THREE TIMES DAILY AS NEEDED FOR YEAST RASH INTERTRIGO 06/27/22   Romeo Co, Kayleen Party, DO  ondansetron  (ZOFRAN -ODT) 4 MG disintegrating tablet DISSOLVE 1 TABLET ON THE TONGUE EVERY 8 HOURS AS NEEDED FOR NAUSEA AND VOMITING 04/10/23   Romeo Co, Kayleen Party, DO  oxyCODONE -acetaminophen  (PERCOCET/ROXICET) 5-325 MG tablet Take 2 tablets by mouth every 6 (six) hours as needed for moderate pain (pain score 4-6). 04/03/23   Annamaria Barrette, NP  pantoprazole  (PROTONIX ) 40 MG tablet TAKE 1 TABLET BY MOUTH DAILY AT  9 AM 04/16/23   Karamalegos, Kayleen Party, DO  QUEtiapine  (SEROQUEL ) 100 MG tablet TAKE 1 TABLET BY MOUTH DAILY AT  9 PM AT BEDTIME Patient not taking: Reported on 05/22/2023 10/07/22   Raina Bunting, DO  [facility] syringe  10/31/22   [provider]  topiramate  (TOPAMAX ) 100 MG tablet TAKE 1 TABLET BY MOUTH TWICE  DAILY 10/07/22   Raina Bunting, DO  TRELEGY ELLIPTA  100-62.5-25 MCG/ACT AEPB USE 1 INHALATION BY MOUTH ONCE  DAILY AT THE SAME TIME EACH DAY 02/17/23   Raina Bunting, DO  trimethoprim -polymyxin b  (POLYTRIM ) ophthalmic solution Place 1 drop into both eyes 4 (four) times daily. For 7-10 days 03/02/23   Raina Bunting, DO  Vitamin D , Ergocalciferol , (DRISDOL ) 1.25 MG (50000 UNIT) CAPS capsule TAKE 1 CAPSULE BY MOUTH EVERY WEDNESDAY @9AM  06/27/22   Raina Bunting, DO    Physical Exam: Vitals:   06/02/23 1320 06/02/23 1324 06/02/23 1400 06/02/23 1430  BP: 109/72  109/77 106/71  Pulse: 89  87 85  Resp: 15  16 14   Temp: 97.8 F (36.6 C)     TempSrc: Oral     SpO2: 96%  99% 99%  Weight:  97.8 kg    Height:  5\' 6"  (1.676 m)     Physical Exam Constitutional:      General: She is not in acute distress.    Appearance: She is not ill-appearing or toxic-appearing.  HENT:     Head: Normocephalic and atraumatic.  Eyes:     Pupils: Pupils are  equal, round, and reactive to light.  Cardiovascular:     Rate and Rhythm: Normal rate and regular rhythm.  Pulmonary:     Effort: Pulmonary effort is normal. No respiratory distress.     Breath sounds: Normal breath sounds. No wheezing.  Abdominal:     General: There is no distension.     Palpations: Abdomen is soft.     Tenderness: There is no abdominal tenderness.  Musculoskeletal:  General: No tenderness or deformity.     Cervical back: Normal range of motion and neck supple.     Right lower leg: No edema.     Left lower leg: No edema.  Skin:    Comments: Skin tears on right forearm. Dressing saturated in blood    Neurological:     Mental Status: She is alert and oriented to person, place, and time.     Cranial Nerves: Facial asymmetry (mild right sided facial droop, reported chronic) present.     Motor: Weakness (right sided) present.     Data Reviewed:   Labs on Admission: I have personally reviewed following labs and imaging studies  CBC: Recent Labs  Lab 06/02/23 1255  WBC 10.3  NEUTROABS 5.0  HGB 13.5  HCT 42.2  MCV 89.8  PLT 329   Basic Metabolic Panel: Recent Labs  Lab 06/02/23 1255  NA 136  K 3.5  CL 102  CO2 23  GLUCOSE 100*  BUN 17  CREATININE 0.70  CALCIUM  9.0   GFR: Estimated Creatinine Clearance: 89.3 mL/min (by C-G formula based on SCr of 0.7 mg/dL). Liver Function Tests: Recent Labs  Lab 06/02/23 1255  AST 12*  ALT 8  ALKPHOS 52  BILITOT 0.4  PROT 7.1  ALBUMIN 3.5   No results for input(s): "LIPASE", "AMYLASE" in the last 168 hours. No results for input(s): "AMMONIA" in the last 168 hours. Coagulation Profile: Recent Labs  Lab 06/02/23 1255  INR 1.0   Cardiac Enzymes: No results for input(s): "CKTOTAL", "CKMB", "CKMBINDEX", "TROPONINI" in the last 168 hours. BNP (last 3 results) No results for input(s): "PROBNP" in the last 8760 hours. HbA1C: No results for input(s): "HGBA1C" in the last 72 hours. CBG: Recent  Labs  Lab 06/02/23 1255  GLUCAP 115*   Lipid Profile: No results for input(s): "CHOL", "HDL", "LDLCALC", "TRIG", "CHOLHDL", "LDLDIRECT" in the last 72 hours. Thyroid  Function Tests: No results for input(s): "TSH", "T4TOTAL", "FREET4", "T3FREE", "THYROIDAB" in the last 72 hours. Anemia Panel: No results for input(s): "VITAMINB12", "FOLATE", "FERRITIN", "TIBC", "IRON", "RETICCTPCT" in the last 72 hours. Urine analysis:    Component Value Date/Time   COLORURINE YELLOW (A) 12/08/2021 2343   APPEARANCEUR Clear 09/08/2022 1446   LABSPEC >1.046 (H) 12/08/2021 2343   LABSPEC 1.030 09/23/2013 0154   PHURINE 5.0 12/08/2021 2343   GLUCOSEU Negative 09/08/2022 1446   GLUCOSEU >=500 09/23/2013 0154   HGBUR NEGATIVE 12/08/2021 2343   BILIRUBINUR Negative 09/08/2022 1446   BILIRUBINUR Negative 09/23/2013 0154   KETONESUR 80 (A) 12/08/2021 2343   PROTEINUR Negative 09/08/2022 1446   PROTEINUR 30 (A) 12/08/2021 2343   UROBILINOGEN 0.2 04/21/2022 1445   UROBILINOGEN 0.2 03/11/2012 1234   NITRITE Negative 09/08/2022 1446   NITRITE NEGATIVE 12/08/2021 2343   LEUKOCYTESUR Negative 09/08/2022 1446   LEUKOCYTESUR TRACE (A) 12/08/2021 2343   LEUKOCYTESUR Negative 09/23/2013 0154    Radiological Exams on Admission: CT HEAD CODE STROKE WO CONTRAST Result Date: 06/02/2023 CLINICAL DATA:  Code stroke. Provided history: Neuro deficit, acute, stroke suspected. EXAM: CT HEAD WITHOUT CONTRAST TECHNIQUE: Contiguous axial images were obtained from the base of the skull through the vertex without intravenous contrast. RADIATION DOSE REDUCTION: This exam was performed according to the departmental dose-optimization program which includes automated exposure control, adjustment of the mA and/or kV according to patient size and/or use of iterative reconstruction technique. COMPARISON:  Brain MRI 12/09/2021. FINDINGS: Brain: No age-advanced or lobar predominant cerebral atrophy. Chronic infarcts within the left  cerebral  hemispheric white matter and within/about the left basal ganglia, not appreciably changed from the prior brain MRI of 12/09/2021. There is no acute intracranial hemorrhage. No demarcated cortical infarct. No extra-axial fluid collection. No evidence of an intracranial mass. No midline shift. Vascular: No hyperdense vessel.  Atherosclerotic calcifications. Skull: No calvarial fracture or aggressive osseous lesion. Sinuses/Orbits: No acute finding within the imaged orbits. No significant paranasal sinus disease at the imaged levels. ASPECTS [facility] Stroke Program Early CT Score) - Ganglionic level infarction (caudate, lentiform nuclei, internal capsule, insula, M1-M3 cortex): 7 - Supraganglionic infarction (M4-M6 cortex): 3 Total score (0-10 with 10 being normal): 10 (when discounting chronic infarcts). No acute intracranial finding. These results were communicated to Dr. Doretta Gant At 1:21 pmon 5/20/2025by text page via the [facility] messaging system. IMPRESSION: 1. No acute intracranial finding. 2. Unchanged chronic infarcts within the left cerebral hemispheric white matter and within/about the left basal ganglia. Electronically Signed   By: Bascom Lily D.O.   On: 06/02/2023 13:22       Assessment and Plan: No notes have been filed under this hospital service. Service: Hospitalist  58 y.o. female with medical history significant of  current smoker, HTN, HLD, DM, PAD, chronic pain, previous stroke with right sided deficits, clopidogrel  75 mg daily presenting to the ED for evaluation given acute worsening of right sided weakness with  new loss of sensation in the right upper and lower extremities, dysarthria, right facial droop, disorientation, and headache.  Code stroke was activated by EMS.  IV thrombolytics were recommended however patient refused.  Fortunately, her symptoms have improved significantly and she is near her baseline.  CT head with chronic changes, no acute intracranial abnormality.   Admitted for completion of stroke workup.  CVA workup  - MRI, MRA head/neck  ECHO w bubble study, monitor on telemetry - PT/OT evaluations appreciated - Smoking cessation encouraged - Continue Plavix  and statin. Asa added by Neurology. Duration tbd.  - neurochecks  - Neurology consult appreciated   Chronic pain - Continue home pain regimen.  Hold gabapentin  and flexaril for now, resume as indicated.   PDMP reviewed.  Hypotension - continue midodrine    COPD  - continue maintenance inhalers  T2DM- A1c 5.9. diet controlled   Lovenox   NS@40cc /h Monitor/replace electrolytes Heart healthy/modified carb diet pending bedside swallow  Advance Care Planning:   Code Status: Full Code discussed with patient at bedside at the time of admission Consults: Neurology  Severity of Illness: The appropriate patient status for this patient is OBSERVATION. Observation status is judged to be reasonable and necessary in order to provide the required intensity of service to ensure the patient's safety. The patient's presenting symptoms, physical exam findings, and initial radiographic and laboratory data in the context of their medical condition is felt to place them at decreased risk for further clinical deterioration. Furthermore, it is anticipated that the patient will be medically stable for discharge from the hospital within 2 midnights of admission.   Author: Charlesetta Connors, DO 06/02/2023 4:10 PM  For on call review [email or web address].

## 2023-06-02 NOTE — ED Provider Notes (Signed)
 [facility] Provider Note    Event Date/Time   First MD Initiated Contact with Patient 06/02/23 1257     (approximate)   History   Code Stroke  Pt presents to the ED via [facility] from home. Pt had a sudden onset of right sided weakness and facial droop and a HA. LKW 1200. Sx onset 1205. Pt remained A&Ox4 the entire time per EMS and HA had improved prior to arrival to ED.   Pt reports generalized pain all over body (9/10). Pt states that she has chronic pain and that it is time for her pain medication   HPI [name] is a 59 y.o. female PMH multiple comorbidities including diabetes, COPD, hypertension, hyperlipidemia, prior stroke presents for evaluation of strokelike symptoms -Per MS report, family was with patient around 12:05 PM today when she began experiencing right-sided weakness and confusion, newly unable to use her phone.  Ambulance called, code stroke activated en route.  Patient was complaining of headache earlier that has since improved. - Patient endorses feeling confused and weak on the right side  Glucose normal.      Physical Exam    Most recent vital signs: Vitals:   06/02/23 1400 06/02/23 1430  BP: 109/77 106/71  Pulse: 87 85  Resp: 16 14  Temp:    SpO2: 99% 99%     General: Awake, no distress.  CV:  Good peripheral perfusion. RRR Resp:  Normal effort.  Breathing comfortably on room air Neuro:  Aox4, +R facial droop, +R lateral visual field deficits from R eye, FNF w/ dysmetria RUE, BUE AG 10+ sec no drift, BLE AG 5+ sec no drift. +R grip weak. Ambulates with steady gait. SILT. Negative Rhomberg.   NIH Stroke Scale 1a. Level of Consciousness: 0 1b. LOC Questions- The patient is asked the month and his/her age: 74 1c. LOC Commands- The patient is asked to open and close the eyes and then grip and release the non-paretic hand: 0 2. Best Gaze: 0 3. Visual: 1 4. Facial Palsy: 2  5A. Motor Left Arm: 0 5B. Motor Right Arm:  0 6A. Motor Left Leg: 0 6B. Motor Right Leg: 0 7. Limb Ataxia: 1 8. Sensory: 0 9. Best Language: 76 10. Dysarthia: 0 11. Extinction and Inattention (formerly Neglect): 0  Total score: 4      ED Results / Procedures / Treatments   Labs (all labs ordered are listed, but only abnormal results are displayed) Labs Reviewed  DIFFERENTIAL - Abnormal; Notable for the following components:      Result Value   Lymphs Abs 4.3 (*)    All other components within normal limits  COMPREHENSIVE METABOLIC PANEL WITH GFR - Abnormal; Notable for the following components:   Glucose, Bld 100 (*)    AST 12 (*)    All other components within normal limits  CBG MONITORING, ED - Abnormal; Notable for the following components:   Glucose-Capillary 115 (*)    All other components within normal limits  PROTIME-INR  APTT  CBC  ETHANOL  HEMOGLOBIN A1C  CBC  CREATININE, SERUM  TSH  I-STAT CREATININE, ED     EKG  Ecg = sinus rhythm, rate 89, no ST elevation or depression, significant repolarization abnormality, axis, normal intervals.  No evidence of ischemia or arrhythmia on my read.   RADIOLOGY Interpreted by myself and radiology reports reviewed.    PROCEDURES:  Critical Care performed: Yes, see critical care procedure note(s)  .Critical Care  Performed by: Collis Deaner, MD Authorized by: Collis Deaner, MD   Critical care provider statement:    Critical care time (minutes):  30   Critical care was necessary to treat or prevent imminent or life-threatening deterioration of the following conditions:  CNS failure or compromise (CODE STROKE)   Critical care was time spent personally by me on the following activities:  Development of treatment plan with patient or surrogate, discussions with consultants, evaluation of patient's response to treatment, examination of patient, ordering and review of laboratory studies, ordering and review of radiographic studies, ordering and performing  treatments and interventions, pulse oximetry, re-evaluation of patient's condition and review of old charts   I assumed direction of critical care for this patient from another provider in my specialty: no     Care discussed with: admitting provider      MEDICATIONS ORDERED IN ED: Medications  sodium chloride  flush (NS) 0.9 % injection 3 mL (3 mLs Intravenous Not Given 06/02/23 1345)   stroke: early stages of recovery book (has no administration in time range)  0.9 %  sodium chloride  infusion (has no administration in time range)  acetaminophen  (TYLENOL ) tablet 650 mg (has no administration in time range)    Or  acetaminophen  (TYLENOL ) 160 MG/5ML solution 650 mg (has no administration in time range)    Or  acetaminophen  (TYLENOL ) suppository 650 mg (has no administration in time range)  senna-docusate (Senokot-S) tablet 1 tablet (has no administration in time range)  enoxaparin  (LOVENOX ) injection 50 mg (has no administration in time range)  aspirin  tablet 325 mg (has no administration in time range)    Followed by  aspirin  EC tablet 81 mg (has no administration in time range)  clopidogrel  (PLAVIX ) tablet 75 mg (has no administration in time range)     IMPRESSION / MDM / ASSESSMENT AND PLAN / ED COURSE  I reviewed the triage vital signs and the nursing notes.                              DDX/MDM/AP: Differential diagnosis includes, but is not limited to, CVA / TIA, doubt underlying metabolic abnormality as etiology of presentation.  Plan: - Code stroke - Neurology consult --met patient in ambulance bay on arrival, appreciate assistance - Noncon CT head - Labs - EKG   Patient's presentation is most consistent with acute presentation with potential threat to life or bodily function.  The patient is on the cardiac monitor to evaluate for evidence of arrhythmia and/or significant heart rate changes.  ED course below.  Neurology evaluated patient on arrival, reviewed CT,  recommend TNK though patient declined given already poor baseline function of her right upper extremity.  Admitted to hospitalist service for further stroke workup and monitoring.  Clinical Course as of 06/02/23 1616  Tue Jun 02, 2023  1307 CT head with no obvious bleed on my interpretation [MM]  1331 CBC unremarkable [MM]  1331 Neuro recs: - offered TNK, patient declined and is not interested in this moving forward -Admit for stroke workup [MM]  1331 CTH: IMPRESSION: 1. No acute intracranial finding. 2. Unchanged chronic infarcts within the left cerebral hemispheric white matter and within/about the left basal ganglia.   [MM]  1341 CMP reviewed, unremarkable [MM]  1342 Hospitalist consult order placed [MM]    Clinical Course User Index [MM] Collis Deaner, MD     FINAL CLINICAL IMPRESSION(S) / ED DIAGNOSES  Final diagnoses:  Cerebrovascular accident (CVA), unspecified mechanism (HCC)  Right sided weakness     Rx / DC Orders   ED Discharge Orders     None        Note:  This document was prepared using Dragon voice recognition software and may include unintentional dictation errors.   Collis Deaner, MD 06/02/23 [phone]

## 2023-06-02 NOTE — Progress Notes (Signed)
   06/02/23 1300  Spiritual Encounters  Type of Visit Attempt (pt unavailable)  Care provided to: Pt not available  Conversation partners present during encounter Other (comment) Tax adviser)  Reason for visit Code  OnCall Visit No   Unit Chaplain received a CODE STROKE from On-Call Chaplain.  Chaplain went to ED and was told by staff the patient was in CT and the room number the patient would be coming back to.  Chaplain shared with staff that she'd return to check on patient after testing would be completed.  Rev. Rana M. Nolon Baxter, M.Div. Chaplain Resident  [facility]

## 2023-06-03 ENCOUNTER — Observation Stay

## 2023-06-03 ENCOUNTER — Observation Stay
Admit: 2023-06-03 | Discharge: 2023-06-03 | Disposition: A | Discharge status: Home / Self Care | Attending: Neurology | Admitting: Neurology

## 2023-06-03 DIAGNOSIS — E119 Type 2 diabetes mellitus without complications: Secondary | ICD-10-CM | POA: Diagnosis not present

## 2023-06-03 DIAGNOSIS — I6782 Cerebral ischemia: Secondary | ICD-10-CM | POA: Diagnosis not present

## 2023-06-03 DIAGNOSIS — I6389 Other cerebral infarction: Secondary | ICD-10-CM | POA: Diagnosis not present

## 2023-06-03 DIAGNOSIS — I959 Hypotension, unspecified: Secondary | ICD-10-CM

## 2023-06-03 DIAGNOSIS — R531 Weakness: Secondary | ICD-10-CM | POA: Diagnosis not present

## 2023-06-03 DIAGNOSIS — R29810 Facial weakness: Secondary | ICD-10-CM | POA: Diagnosis not present

## 2023-06-03 DIAGNOSIS — G8929 Other chronic pain: Secondary | ICD-10-CM | POA: Diagnosis not present

## 2023-06-03 DIAGNOSIS — F419 Anxiety disorder, unspecified: Secondary | ICD-10-CM

## 2023-06-03 DIAGNOSIS — J449 Chronic obstructive pulmonary disease, unspecified: Secondary | ICD-10-CM

## 2023-06-03 DIAGNOSIS — K219 Gastro-esophageal reflux disease without esophagitis: Secondary | ICD-10-CM

## 2023-06-03 DIAGNOSIS — L8990 Pressure ulcer of unspecified site, unspecified stage: Secondary | ICD-10-CM | POA: Insufficient documentation

## 2023-06-03 DIAGNOSIS — I6310 Cerebral infarction due to embolism of unspecified precerebral artery: Secondary | ICD-10-CM

## 2023-06-03 DIAGNOSIS — I639 Cerebral infarction, unspecified: Secondary | ICD-10-CM | POA: Diagnosis not present

## 2023-06-03 DIAGNOSIS — F32A Depression, unspecified: Secondary | ICD-10-CM

## 2023-06-03 DIAGNOSIS — I63239 Cerebral infarction due to unspecified occlusion or stenosis of unspecified carotid arteries: Secondary | ICD-10-CM | POA: Diagnosis not present

## 2023-06-03 LAB — LIPID PANEL
Cholesterol: 137 mg/dL (ref 0–200)
HDL: 31 mg/dL — ABNORMAL LOW (ref >40)
LDL Cholesterol: 71 mg/dL (ref 0–99)
Total CHOL/HDL Ratio: 4.4 ratio
Triglycerides: 177 mg/dL — ABNORMAL HIGH (ref <150)
VLDL: 35 mg/dL (ref 0–40)

## 2023-06-03 LAB — ECHOCARDIOGRAM COMPLETE BUBBLE STUDY
AR max vel: 2.29 cm2
AV Area VTI: 2.26 cm2
AV Area mean vel: 2.38 cm2
AV Mean grad: 3 mmHg
AV Peak grad: 5.5 mmHg
Ao pk vel: 1.17 m/s
Area-P 1/2: 4.26 cm2
S' Lateral: 2.5 cm

## 2023-06-03 MED ORDER — GADOBUTROL 1 MMOL/ML IV SOLN
10.0000 mL | Freq: Once | INTRAVENOUS | Status: AC | PRN
  Administered 2023-06-03: 10 mL via INTRAVENOUS

## 2023-06-03 MED ORDER — DIPHENHYDRAMINE HCL 50 MG/ML IJ SOLN
25.0000 mg | Freq: Once | INTRAMUSCULAR | Status: AC
  Administered 2023-06-03: 25 mg via INTRAVENOUS
  Filled 2023-06-03: qty 1

## 2023-06-03 MED ORDER — LORAZEPAM 2 MG/ML IJ SOLN
1.0000 mg | Freq: Once | INTRAMUSCULAR | Status: AC
  Administered 2023-06-03: 1 mg via INTRAVENOUS
  Filled 2023-06-03: qty 1

## 2023-06-03 MED ORDER — QUETIAPINE FUMARATE 25 MG PO TABS
100.0000 mg | ORAL_TABLET | Freq: Every day | ORAL | Status: DC
  Administered 2023-06-03: 100 mg via ORAL
  Filled 2023-06-03: qty 4

## 2023-06-03 MED ORDER — KETOROLAC TROMETHAMINE 30 MG/ML IJ SOLN
30.0000 mg | Freq: Once | INTRAMUSCULAR | Status: AC
  Administered 2023-06-03: 30 mg via INTRAVENOUS
  Filled 2023-06-03: qty 1

## 2023-06-03 MED ORDER — PROCHLORPERAZINE EDISYLATE 10 MG/2ML IJ SOLN
10.0000 mg | Freq: Once | INTRAMUSCULAR | Status: AC
  Administered 2023-06-03: 10 mg via INTRAVENOUS
  Filled 2023-06-03: qty 2

## 2023-06-03 NOTE — Care Management Obs Status (Signed)
 MEDICARE OBSERVATION STATUS NOTIFICATION   Patient Details  Name: [name] MRN: [id number] Date of Birth: August 17, 1965   Medicare Observation Status Notification Given:  Yes    Marcelus Dubberly W, CMA 06/03/2023, 2:39 PM

## 2023-06-03 NOTE — Assessment & Plan Note (Signed)
Continue Percocet.

## 2023-06-03 NOTE — Progress Notes (Signed)
 SLP Cancellation Note  Patient Details Name: [name] MRN: [id number] DOB: 1965/11/11   Cancelled treatment:       Reason Eval/Treat Not Completed: Patient at procedure or test/unavailable  PT working with pt, MD in room as well. Will re-attempt.   Myrah Strawderman B. Garlin Junker, M.S., CCC-SLP, CBIS Speech-Language Pathologist Certified Brain Injury Specialist [facility]  [facility] [place] [phone] Ascom [phone] Fax [phone]  Babette Bolognese 06/03/2023, 9:23 AM

## 2023-06-03 NOTE — Assessment & Plan Note (Signed)
 Depression. - Continue home Wellbutrin  and Celexa 

## 2023-06-03 NOTE — Assessment & Plan Note (Signed)
 No concern for exacerbation. -Continue home bronchodilators

## 2023-06-03 NOTE — Plan of Care (Signed)

## 2023-06-03 NOTE — Progress Notes (Signed)
*  PRELIMINARY RESULTS* Echocardiogram 2D Echocardiogram has been performed.  [name] 06/03/2023, 7:55 AM

## 2023-06-03 NOTE — Evaluation (Signed)
 Speech Language Pathology Evaluation Patient Details Name: [name] MRN: [id number] DOB: [birth year] Today's Date: 06/03/2023 Time: 1357-1410 SLP Time Calculation (min) (ACUTE ONLY): 13 min  Problem List:  Patient Active Problem List   Diagnosis Date Noted   CVA (cerebral vascular accident) (HCC) 06/02/2023   Acute osteomyelitis of toe of left foot (HCC) 03/26/2023   Osteomyelitis of left foot (HCC) 03/25/2023   Type 2 diabetes mellitus with peripheral vascular disease (HCC) 03/25/2023   PVD (peripheral vascular disease) (HCC) 03/25/2023   HTN (hypertension) 03/25/2023   Chronic right hip pain 05/06/2022   Chronic low back pain without sciatica 01/14/2022   Generalized weakness 12/18/2021   Hypokalemia 12/10/2021   High anion gap metabolic acidosis 12/09/2021   Obesity (BMI 30-39.9) 12/09/2021   Hemiparesis of right dominant side as late effect of cerebrovascular disease (HCC) 09/21/2020   Vitamin D  deficiency 08/22/2020   Urinary incontinence 07/27/2020   Centrilobular emphysema (HCC) 07/10/2020   Moderate episode of recurrent major depressive disorder (HCC) 07/10/2020   Chronic pain syndrome 07/10/2020   Intractable chronic migraine without aura and with status migrainosus 07/10/2020   Chronic hypoxemic respiratory failure (HCC) 09/23/2017   Pulmonary nodules 09/23/2017   Trimalleolar fracture 08/09/2017   TIA (transient ischemic attack) 04/07/2017   Anxiety and depression 04/07/2017   History of cerebrovascular accident (CVA) with residual deficit    Complicated migraine    Brain TIA 10/11/2012   Hyperlipidemia 10/11/2012   Nocturnal hypoxemia 10/11/2012   Type 2 diabetes mellitus with other specified complication (HCC) 10/11/2012   Tobacco abuse 10/11/2012   COPD (chronic obstructive pulmonary disease) (HCC) 10/11/2012   Past Medical History:  Past Medical History:  Diagnosis Date   Anxiety    Cancer (HCC)    a spot on liver and treated    Complication of  anesthesia    restless,easily upset   COPD (chronic obstructive pulmonary disease) (HCC)    Depression    Diabetes mellitus without complication (HCC)    Diverticulitis    Fatty liver    GERD (gastroesophageal reflux disease)    Headache(784.0)    migraines   History of kidney stones    Hyperlipidemia    Hypertension    PAD (peripheral artery disease) (HCC)    Pneumonia    Restless    Stroke [facility])    Past Surgical History:  Past Surgical History:  Procedure Laterality Date   ABDOMINAL HYSTERECTOMY     ACHILLES TENDON LENGTHENING Right 08/18/2017   Procedure: ACHILLES TENDON LENGTHENING;  Surgeon: Hardy Lia, MD;  Location: [facility] OR;  Service: Orthopedics;  Laterality: Right;   AMPUTATION TOE Left 04/02/2023   Procedure: AMPUTATION, TOE;  Surgeon: Evertt Hoe, DPM;  Location: [facility] ORS;  Service: Orthopedics/Podiatry;  Laterality: Left;  LEFT GREAT TOE   ANTERIOR CERVICAL DECOMP/DISCECTOMY FUSION N/A 03/11/2012   Procedure: ANTERIOR CERVICAL DECOMPRESSION/DISCECTOMY FUSION 1 LEVEL;  Surgeon: Elder Greening, MD;  Location: [facility] NEURO ORS;  Service: Neurosurgery;  Laterality: N/A;  Cervical five-six anterior cervical decompression with fusion interbody prothesis plating and bonegraft   BACK SURGERY     ENDARTERECTOMY FEMORAL Left 04/01/2023   Procedure: ENDARTERECTOMY, FEMORAL;  Surgeon: Jackquelyn Mass, MD;  Location: [facility] ORS;  Service: Vascular;  Laterality: Left;  with Popiteal stent placment   EXTERNAL FIXATION LEG Right 08/10/2017   Procedure: EXTERNAL FIXATION ANKLE;  Surgeon: Arlyne Lame, MD;  Location: [facility] ORS;  Service: Orthopedics;  Laterality: Right;   EXTERNAL FIXATION REMOVAL Right 08/18/2017  Procedure: REMOVAL EXTERNAL FIXATION LEG;  Surgeon: Hardy Lia, MD;  Location: [facility];  Service: Orthopedics;  Laterality: Right;   EYE SURGERY     HERNIA REPAIR     IR FL GUIDED LOC OF NEEDLE/CATH TIP FOR SPINAL INJECTION LT  12/09/2021   IR FL GUIDED LOC OF  NEEDLE/CATH TIP FOR SPINAL INJECTION RT  12/09/2021   JOINT REPLACEMENT     bil knees   LOWER EXTREMITY ANGIOGRAPHY Left 03/27/2023   Procedure: Lower Extremity Angiography;  Surgeon: Jackquelyn Mass, MD;  Location: [facility] INVASIVE CV LAB;  Service: Cardiovascular;  Laterality: Left;   ORIF ANKLE FRACTURE Right 08/18/2017   Procedure: OPEN REDUCTION INTERNAL FIXATION (ORIF) ANKLE FRACTURE;  Surgeon: Hardy Lia, MD;  Location: [facility] OR;  Service: Orthopedics;  Laterality: Right;   REPLACEMENT TOTAL KNEE BILATERAL     HPI:  [name] is a 58 y.o. female with hx of hypertension, hyperlipidemia, diabetes, peripheral artery disease, previous stroke with right-sided deficits who presented to the ED on 06/02/2023 for acute worsening of right sided weakness and new right sided sensory deficit. LKW 1205. NIHSS = 9 in ED for baseline R sided weakness and new R sensory deficit, R visual field deficit, RUE ataxia, and R sided visual and hemisensory neglect. When further asked about her symptoms she reports that she does not actually feel much weaker than usual on the R but that she is having trouble controlling her R hand more than usual. Head CT showed no acute process. Patient is on plavix  at home but no anticoagulation. Given the prominent ataxia in her RUE and being R hand dominant TNK was offered to patient but after discussion of risks, benefits, and alternatives patient declined and further stated that her "right side was shot anyway." I recommended that she stay in the ED until she was outside of the TNK window at 4:35 PM however patient declined and stated that no matter what happened if her symptoms got worse then she would not want TNK at that point either therefore patient was admitted for stroke workup.  CTA was not performed as part of the stroke code because patient has an MRS of 4 and would therefore not be eligible for intervention even if there was a LVO present, particularly with mild/moderate  symptoms.   Assessment / Plan / Recommendation Clinical Impression  Pt benefited from continuous stimulation d/t recently receiving sedating medicaitons for MRI. MRI has not been read at the time of this evaluation. During this evaluation, pt was able to communicate her basic wants/needs and immediate information regarding her day. Pt's nurse also states that pt has been able to communicate her wants/needs without any issues today. At this time, skilled ST intervention is not indicated during this acute hospitalization.    SLP Assessment  SLP Recommendation/Assessment: Patient does not need any further Speech Lanaguage Pathology Services SLP Visit Diagnosis: Cognitive communication deficit (R41.841)    Recommendations for follow up therapy are one component of a multi-disciplinary discharge planning process, led by the attending physician.  Recommendations may be updated based on patient status, additional functional criteria and insurance authorization.    Follow Up Recommendations  No SLP follow up       Functional Status Assessment Patient has not had a recent decline in their functional status        SLP Evaluation Cognition  Overall Cognitive Status: History of cognitive impairments - at baseline       Comprehension  Auditory Comprehension Overall  Auditory Comprehension: Appears within functional limits for tasks assessed Yes/No Questions: Within Functional Limits Commands: Within Functional Limits    Expression Expression Primary Mode of Expression: Verbal Verbal Expression Overall Verbal Expression: Impaired at baseline Automatic Speech: Name;Social Response Level of Generative/Spontaneous Verbalization: Phrase Repetition: No impairment   Oral / Motor  Oral Motor/Sensory Function Overall Oral Motor/Sensory Function: Within functional limits Motor Speech Overall Motor Speech: Impaired at baseline           Jace Dowe B. Garlin Junker, M.S., CCC-SLP, Research officer, trade union Certified Brain Injury Specialist [facility]  [facility] Rehabilitation Services Office [phone] Ascom [phone] Fax [phone]

## 2023-06-03 NOTE — Assessment & Plan Note (Signed)
-

## 2023-06-03 NOTE — Progress Notes (Signed)
   [facility] has been requested to perform a transesophageal echocardiogram on [name] for cerebral vascular accident.     The patient does NOT have any absolute or relative contraindications to a Transesophageal Echocardiogram (TEE).  The patient has: No other conditions that may impact this procedure.   After careful review of history and examination, the risks and benefits of transesophageal echocardiogram have been explained including risks of esophageal damage, perforation (1:10,000 risk), bleeding, pharyngeal hematoma as well as other potential complications associated with conscious sedation including aspiration, arrhythmia, respiratory failure and death. Alternatives to treatment were discussed, questions were answered. Patient is willing to proceed.   Amie Bald, PA-C  06/03/2023 4:49 PM

## 2023-06-03 NOTE — Progress Notes (Signed)
 Progress Note   Patient: [name] MVH:[id number] DOB: 12/07/1965 DOA: 06/02/2023     0 DOS: the patient was seen and examined on 06/03/2023   Brief hospital course: Taken from H&P.  THAI HEMRICK is a 58 y.o. female with medical history significant of  current smoker, HTN, HLD, DM, PAD, previous stroke with right sided deficits, clopidogrel  75 mg daily presenting to the ED for evaluation given acute worsening of right sided weakness with  new loss of sensation in the right upper and lower extremities, dysarthria, right facial droop, disorientation, and headache that started at 12:05.   On presentation code stroke was activated antiquing and was recommended but patient declined.  Patient was admitted to complete the stroke workup  CT head with chronic changes, no acute abnormality noted.  5/21: Vital stable, lipid panel with mildly elevated triglyceride at 177, HDL 31 and LDL 71, A1c of 5.9, rest of the labs unremarkable.Aspirin  was added to her home Plavix .  MRI brain with acute infarct in the left PCA territory, additional punctate acute lacunar infarct in the left thalamus. There were also subacute infarcts in the right temporal and occipital white matter.  Concern of embolic stroke. MRA head and neck with likely occlusion of left PCA at the proximal P1 segment, otherwise patent intracranial vasculature.  Moderate chronic microvascular changes. Echocardiogram normal.  Neurology is recommending TEE Cardiology was consulted for TEE. PT is recommending home health which is ordered  Patient is wheelchair and bedbound at baseline.  Assessment and Plan: * CVA (cerebral vascular accident) (HCC) MRI with multiple small infarcts involving different areas concerning for embolic stroke.  Completed stroke workup PT is recommending home health which was ordered Echocardiogram with bubble study was normal Neurology would like to get TEE Cardiology was consulted -Continue with Plavix  and  statin -Continue supportive care  Hypotension Continue home midodrine   Anxiety Depression. - Continue home Wellbutrin  and Celexa   Chronic pain Continue Percocet  COPD (chronic obstructive pulmonary disease) (HCC) No concern for exacerbation. -Continue home bronchodilators  Diabetes mellitus without complication (HCC) A1c of 5.9.  Not on any medications -Continue to monitor  GERD (gastroesophageal reflux disease) - Continue Protonix       Subjective: Patient was complaining of headache since she is in the hospital.  History of frequent headaches.  She received home Percocet that did not help.  No neurodeficit  Physical Exam: Vitals:   06/03/23 0500 06/03/23 0845 06/03/23 1303 06/03/23 1539  BP: 109/79 102/68 99/68 108/68  Pulse: 80 84 80 77  Resp: 18 19 14 18   Temp: 97.8 F (36.6 C) 98.5 F (36.9 C) 98 F (36.7 C) (!) 97.5 F (36.4 C)  TempSrc:  Oral    SpO2: 94% 95% 97% 99%  Weight:      Height:       General.  Obese lady, in no acute distress. Pulmonary.  Lungs clear bilaterally, normal respiratory effort. CV.  Regular rate and rhythm, no JVD, rub or murmur. Abdomen.  Soft, nontender, nondistended, BS positive. CNS.  Alert and oriented .  No new focal neurologic deficit. Extremities.  No edema, no cyanosis, pulses intact and symmetrical. Psychiatry.  Judgment and insight appears normal.   Data Reviewed: Prior data reviewed  Family Communication: Discussed with patient  Disposition: Status is: Observation The patient will require care spanning > 2 midnights and should be moved to inpatient because: Severity of illness, patient with multiple strokes  Planned Discharge Destination: Home with Home Health  Time spent:  50 minutes  This record has been created using Conservation officer, historic buildings. Errors have been sought and corrected,but may not always be located. Such creation errors do not reflect on the standard of care.   Author: Luna Salinas,  MD 06/03/2023 4:33 PM  For on call review [email or web address].

## 2023-06-03 NOTE — Hospital Course (Addendum)
 Taken from H&P.  [name] is a 58 y.o. female with medical history significant of  current smoker, HTN, HLD, DM, PAD, previous stroke with right sided deficits, clopidogrel  75 mg daily presenting to the ED for evaluation given acute worsening of right sided weakness with  new loss of sensation in the right upper and lower extremities, dysarthria, right facial droop, disorientation, and headache that started at 12:05.   On presentation code stroke was activated antiquing and was recommended but patient declined.  Patient was admitted to complete the stroke workup  CT head with chronic changes, no acute abnormality noted.  5/21: Vital stable, lipid panel with mildly elevated triglyceride at 177, HDL 31 and LDL 71, A1c of 5.9, rest of the labs unremarkable.Aspirin  was added to her home Plavix .  MRI brain with acute infarct in the left PCA territory, additional punctate acute lacunar infarct in the left thalamus. There were also subacute infarcts in the right temporal and occipital white matter.  Concern of embolic stroke. MRA head and neck with likely occlusion of left PCA at the proximal P1 segment, otherwise patent intracranial vasculature.  Moderate chronic microvascular changes. Echocardiogram normal.  Neurology is recommending TEE Cardiology was consulted for TEE. PT is recommending home health which is ordered  Patient is wheelchair and bedbound at baseline.  5/22: TEE was scheduled for today but patient now refusing and wants to go home.  Neurology is not recommending DAPT with aspirin  and Plavix  for 3 months.  A ZIO monitor was also given and she will follow-up with cardiology for results to see any arrhythmia specially atrial fibrillation causing embolic stroke.  Patient will continue with home Plavix  after finishing 3 months of aspirin  and need to have a close follow-up with his neurologist and primary care provider for further recommendations.  She will continue home cholesterol as LDL  seems controlled.  She will continue on current medications and follow-up with her providers for further assistance.

## 2023-06-03 NOTE — Assessment & Plan Note (Signed)
 Continue home midodrine

## 2023-06-03 NOTE — Assessment & Plan Note (Addendum)
 MRI with multiple small infarcts involving different areas concerning for embolic stroke.  Completed stroke workup PT is recommending home health which was ordered Echocardiogram with bubble study was normal Neurology would like to get TEE Cardiology was consulted -Continue with Plavix  and statin -Continue supportive care

## 2023-06-03 NOTE — Evaluation (Signed)
 Occupational Therapy Evaluation Patient Details Name: [name] MRN: [id number] DOB: November 10, 1965 Today's Date: 06/03/2023   History of Present Illness   Pt is a 58 yo female that presented to the ED for worsening R sided weakness, sensation changes. TNK recommended but pt declined. PMH: T2DM, HTN, PAD, hx of CVA, COPD, toe amputation.     Clinical Impressions Upon entering the room, pt supine in bed and agreeable to OT evaluation. RN present and giving pain medications to pt while therapist is present in room. Pt endorses living with husband who assists pt with self care tasks from bed level. Every other day she transfers into power wheelchair with use of hoyer. Pt utilizing R UE functionally during session to take medications and pick up cup to drink. Pt would like to return home and resume HH therapy services. She is close to functional baseline for self care tasks. Call bell and all needs within reach.      If plan is discharge home, recommend the following:   Assist for transportation;Help with stairs or ramp for entrance;Assistance with cooking/housework;Two people to help with walking and/or transfers     Functional Status Assessment   Patient has not had a recent decline in their functional status     Equipment Recommendations   None recommended by OT      Precautions/Restrictions   Precautions Precautions: Fall Recall of Precautions/Restrictions: Intact     Mobility Bed Mobility Overal bed mobility: Needs Assistance Bed Mobility: Rolling Rolling: Min assist              Transfers                              ADL either performed or assessed with clinical judgement   ADL                                         General ADL Comments: Pt is close to baseline with self care tasks. Pt able to wash UB with min A and husband assists with LB tasks from bed level at baseline.     Vision Patient Visual Report: No change  from baseline              Pertinent Vitals/Pain Pain Assessment Pain Assessment: Faces Faces Pain Scale: Hurts whole lot Pain Location: head Pain Descriptors / Indicators: Headache, Discomfort Pain Intervention(s): Limited activity within patient's tolerance, Monitored during session, Repositioned, RN gave pain meds during session     Extremity/Trunk Assessment Upper Extremity Assessment Upper Extremity Assessment: Generalized weakness   Lower Extremity Assessment Lower Extremity Assessment: Generalized weakness       Communication Communication Communication: No apparent difficulties   Cognition Arousal: Alert Behavior During Therapy: WFL for tasks assessed/performed                                 Following commands: Intact       Cueing  General Comments   Cueing Techniques: Verbal cues              Home Living Family/patient expects to be discharged to:: Private residence Living Arrangements: Spouse/significant other Available Help at Discharge: Family;Available 24 hours/day Type of Home: Mobile home Home Access: Ramped entrance     Home Layout: One level  Bathroom Shower/Tub: Chief Strategy Officer: Standard     Home Equipment: Wheelchair - power;Shower seat;Other (comment);Wheelchair - manual;Hospital bed   Additional Comments: Nurse, adult      Prior Functioning/Environment Prior Level of Function : Needs assist             Mobility Comments: Pt reports spouse assists with all transfers: hoyer lift in the home and spouse will give heavy assist for transfers from wheelchair to/from car/etc. ADLs Comments: Husband assists with ADL and all IADL, pt rolls in bed to doff and don brief with assistance from husband.  Toileting performed from bed level with use of brief            OT Goals(Current goals can be found in the care plan section)   Acute Rehab OT Goals Patient Stated Goal: to go home with  husband OT Goal Formulation: With patient Time For Goal Achievement: 06/17/23 Potential to Achieve Goals: Fair   AM-PAC OT "6 Clicks" Daily Activity     Outcome Measure Help from another person eating meals?: None Help from another person taking care of personal grooming?: None Help from another person toileting, which includes using toliet, bedpan, or urinal?: Total Help from another person bathing (including washing, rinsing, drying)?: A Lot Help from another person to put on and taking off regular upper body clothing?: A Lot Help from another person to put on and taking off regular lower body clothing?: Total 6 Click Score: 14   End of Session Nurse Communication: Mobility status  Activity Tolerance: Patient limited by pain Patient left: in bed;with call bell/phone within reach;with bed alarm set  OT Visit Diagnosis: Unsteadiness on feet (R26.81);Muscle weakness (generalized) (M62.81)                Time: 8295-6213 OT Time Calculation (min): 22 min Charges:  OT General Charges $OT Visit: 1 Visit OT Evaluation $OT Eval Low Complexity: 1 Low OT Treatments $Therapeutic Activity: 8-22 mins  George Kinder, MS, OTR/L , CBIS ascom [phone]  06/03/23, 2:28 PM

## 2023-06-03 NOTE — Assessment & Plan Note (Signed)
 A1c of 5.9.  Not on any medications -Continue to monitor

## 2023-06-03 NOTE — Evaluation (Signed)
 Physical Therapy Evaluation Patient Details Name: [name] MRN: [id number] DOB: 07-20-1965 Today's Date: 06/03/2023  History of Present Illness  Pt is a 58 yo female that presented to the ED for worsening R sided weakness, sensation changes. TNK recommended but pt declined. PMH: T2DM, HTN, PAD, hx of CVA, COPD, toe amputation.  Clinical Impression  Patient alert, agreeable to PT with encouragement. Reported a HA (MD in room and ordered medications) as well as "feeling out of it". Difficulty with word finding noted. At baseline the pt uses a hoyer lift to transfer from bed to [facility], and what sounds like a squat pivot to car with significant assistance from husband. Pt reported she does not walk or stand at baseline, and uses depends (changes in bed) for toileting needs. She was able to roll R with supervision, L with minA (pt reported this is typical at baseline as well) to doff and don new depends. Supine to sit with HOB elevated, minA and use of bed rails. Pt able to sit for several minutes with CGA-supervision. Pt declined any attempts at further mobility to simulate car transfers due to ongoing headache and reported she could not stand. Returned to supine with needs in reach. The pt is interested in resuming HHPT at discharge (stated they recently started due to toe amputation).     If plan is discharge home, recommend the following: A lot of help with walking and/or transfers;A lot of help with bathing/dressing/bathroom;Assist for transportation;Assistance with cooking/housework;Help with stairs or ramp for entrance   Can travel by private vehicle        Equipment Recommendations None recommended by PT  Recommendations for Other Services       Functional Status Assessment  (limited decline from baseline but pt interested and may benefit from skilled PT)     Precautions / Restrictions Precautions Precautions: Fall Recall of Precautions/Restrictions: Intact Restrictions Weight Bearing  Restrictions Per Provider Order: No      Mobility  Bed Mobility Overal bed mobility: Needs Assistance Bed Mobility: Rolling, Supine to Sit, Sit to Supine Rolling: Supervision, Min assist   Supine to sit: Min assist, Used rails, HOB elevated Sit to supine: Min assist   General bed mobility comments: able to roll to the R supervision, minA to L    Transfers                   General transfer comment: pt refused    Ambulation/Gait                  Stairs            Wheelchair Mobility     Tilt Bed    Modified Rankin (Stroke Patients Only)       Balance Overall balance assessment: Needs assistance Sitting-balance support: Feet supported Sitting balance-Leahy Scale: Good                                       Pertinent Vitals/Pain Pain Assessment Pain Assessment: Faces Faces Pain Scale: Hurts little more Pain Location: head Pain Descriptors / Indicators: Aching Pain Intervention(s): Limited activity within patient's tolerance, Monitored during session, Repositioned, Patient requesting pain meds-RN notified    Home Living Family/patient expects to be discharged to:: Private residence Living Arrangements: Spouse/significant other Available Help at Discharge: Family;Available 24 hours/day Type of Home: Mobile home Home Access: Ramped entrance  Home Layout: One level Home Equipment: Wheelchair - power;Shower seat;Other (comment);Wheelchair - manual;Hospital bed Additional Comments: Nurse, adult    Prior Function Prior Level of Function : Needs assist             Mobility Comments: Pt reports spouse assists with all transfers: hoyer lift in the home and spouse will give heavy assist for transfers from wheelchair to/from car/etc. ADLs Comments: Husband assists with ADL and all IADL, pt rolls in bed t doff and don brief with assistance from husband     Extremity/Trunk Assessment   Upper Extremity Assessment Upper  Extremity Assessment: Defer to OT evaluation    Lower Extremity Assessment Lower Extremity Assessment:  (able to lift BLE against gravity, though more effortful on R (pt endorsed feeling the strength was near baseline))       Communication        Cognition Arousal: Alert Behavior During Therapy: WFL for tasks assessed/performed                           PT - Cognition Comments: pt reported feeling foggy, and having trouble with word finding Following commands: Intact       Cueing Cueing Techniques: Verbal cues     General Comments      Exercises     Assessment/Plan    PT Assessment Patient needs continued PT services  PT Problem List Decreased activity tolerance;Decreased mobility       PT Treatment Interventions DME instruction;Balance training;Neuromuscular re-education;Functional mobility training;Patient/family education;Therapeutic activities;Wheelchair mobility training;Therapeutic exercise    PT Goals (Current goals can be found in the Care Plan section)  Acute Rehab PT Goals Patient Stated Goal: to feel better PT Goal Formulation: With patient Time For Goal Achievement: 06/17/23 Potential to Achieve Goals: Fair    Frequency Min 1X/week     Co-evaluation               AM-PAC PT "6 Clicks" Mobility  Outcome Measure Help needed turning from your back to your side while in a flat bed without using bedrails?: A Lot Help needed moving from lying on your back to sitting on the side of a flat bed without using bedrails?: A Lot Help needed moving to and from a bed to a chair (including a wheelchair)?: A Lot Help needed standing up from a chair using your arms (e.g., wheelchair or bedside chair)?: A Lot   Help needed climbing 3-5 steps with a railing? : Total 6 Click Score: 9    End of Session   Activity Tolerance: Other (comment) (pt self limiting, limited by headache) Patient left: in bed;with bed alarm set;with call bell/phone within  reach Nurse Communication: Mobility status PT Visit Diagnosis: Muscle weakness (generalized) (M62.81)    Time: 7846-9629 PT Time Calculation (min) (ACUTE ONLY): 27 min   Charges:   PT Evaluation $PT Eval Low Complexity: 1 Low PT Treatments $Therapeutic Activity: 8-22 mins PT General Charges $$ ACUTE PT VISIT: 1 Visit        Darien Eden PT, DPT 1:03 PM,06/03/23

## 2023-06-03 NOTE — Progress Notes (Signed)
 Brought paperwork for [facility] information for pt. Pt aware of how to get completed if they so desire to compete during hospital stay

## 2023-06-04 ENCOUNTER — Encounter: Payer: Self-pay | Admitting: Anesthesiology

## 2023-06-04 ENCOUNTER — Telehealth: Payer: Self-pay | Admitting: Cardiology

## 2023-06-04 ENCOUNTER — Other Ambulatory Visit: Payer: Self-pay | Admitting: Family Medicine

## 2023-06-04 ENCOUNTER — Encounter: Admission: EM | Disposition: A | Discharge status: Home Health | Payer: Self-pay | Source: Home / Self Care

## 2023-06-04 ENCOUNTER — Inpatient Hospital Stay
Admit: 2023-06-04 | Discharge: 2023-06-04 | Disposition: A | Discharge status: Home / Self Care | Attending: Cardiology | Admitting: Cardiology

## 2023-06-04 ENCOUNTER — Ambulatory Visit (INDEPENDENT_AMBULATORY_CARE_PROVIDER_SITE_OTHER): Admitting: Nurse Practitioner

## 2023-06-04 DIAGNOSIS — F32A Depression, unspecified: Secondary | ICD-10-CM | POA: Diagnosis not present

## 2023-06-04 DIAGNOSIS — K219 Gastro-esophageal reflux disease without esophagitis: Secondary | ICD-10-CM | POA: Diagnosis not present

## 2023-06-04 DIAGNOSIS — I6310 Cerebral infarction due to embolism of unspecified precerebral artery: Secondary | ICD-10-CM | POA: Diagnosis not present

## 2023-06-04 DIAGNOSIS — I6930 Unspecified sequelae of cerebral infarction: Secondary | ICD-10-CM

## 2023-06-04 DIAGNOSIS — F331 Major depressive disorder, recurrent, moderate: Secondary | ICD-10-CM

## 2023-06-04 DIAGNOSIS — I959 Hypotension, unspecified: Secondary | ICD-10-CM | POA: Diagnosis not present

## 2023-06-04 DIAGNOSIS — G8929 Other chronic pain: Secondary | ICD-10-CM | POA: Diagnosis not present

## 2023-06-04 DIAGNOSIS — I639 Cerebral infarction, unspecified: Secondary | ICD-10-CM

## 2023-06-04 DIAGNOSIS — J449 Chronic obstructive pulmonary disease, unspecified: Secondary | ICD-10-CM | POA: Diagnosis not present

## 2023-06-04 DIAGNOSIS — E119 Type 2 diabetes mellitus without complications: Secondary | ICD-10-CM | POA: Diagnosis not present

## 2023-06-04 DIAGNOSIS — F419 Anxiety disorder, unspecified: Secondary | ICD-10-CM | POA: Diagnosis not present

## 2023-06-04 SURGERY — ECHOCARDIOGRAM, TRANSESOPHAGEAL
Anesthesia: General

## 2023-06-04 MED ORDER — ASPIRIN 81 MG PO TBEC: 81.0000 mg | DELAYED_RELEASE_TABLET | Freq: Every day | ORAL | 0 refills | Status: AC

## 2023-06-04 NOTE — TOC Transition Note (Signed)
 Transition of Care [facility]) - Discharge Note   Patient Details  Name: [name] MRN: [id number] Date of Birth: 04-18-1965  [facility] CM/SW Contact:  Elsie Halo, RN Phone Number: 06/04/2023, 10:53 AM   Clinical Narrative:    Patient is medically clear to discharge to home with home health PT/OT. Patient is active with [facility]. TOC placed call to Naoma Bacca [phone] with [facility] who accepted the referral to resume services. No other TOC needs identified.         Patient Goals and CMS Choice            Discharge Placement                       Discharge Plan and Services Additional resources added to the After Visit Summary for                                       Social Drivers of Health (SDOH) Interventions SDOH Screenings   Food Insecurity: No Food Insecurity (06/02/2023)  Housing: Low Risk  (06/02/2023)  Transportation Needs: No Transportation Needs (06/02/2023)  Utilities: Not At Risk (06/02/2023)  Alcohol  Screen: Low Risk  (05/22/2023)  Depression (PHQ2-9): Medium Risk (05/22/2023)  Financial Resource Strain: Low Risk  (05/22/2023)  Physical Activity: Inactive (05/22/2023)  Social Connections: Moderately Isolated (06/02/2023)  Stress: No Stress Concern Present (05/22/2023)  Tobacco Use: High Risk (06/02/2023)  Health Literacy: Adequate Health Literacy (05/22/2023)     Readmission Risk Interventions     No data to display

## 2023-06-04 NOTE — Discharge Summary (Signed)
 Physician Discharge Summary   Patient: [name] DOB: Mar 26, 1965  Admit date:     06/02/2023  Discharge date: 06/04/23  Discharge Physician: Luna Salinas   PCP: Raina Bunting, DO   Recommendations at discharge:  Please obtain CBC and BMP and follow-up Please ensure she uses cardiac monitor appropriately and follow-up with cardiology for results.  She will need to add an anticoagulant if showing atrial fibrillation and cardiology can decide. Follow-up with outpatient neurology Follow-up with primary care provider Follow-up with cardiology  Discharge Diagnoses: Principal Problem:   CVA (cerebral vascular accident) [facility]) Active Problems:   Hypotension   Depression   Anxiety   Chronic pain   COPD (chronic obstructive pulmonary disease) (HCC)   Diabetes mellitus without complication (HCC)   GERD (gastroesophageal reflux disease)   Right sided weakness   Pressure injury of skin   Hospital Course: Taken from H&P.  [name] is a 58 y.o. female with medical history significant of  current smoker, HTN, HLD, DM, PAD, previous stroke with right sided deficits, clopidogrel  75 mg daily presenting to the ED for evaluation given acute worsening of right sided weakness with  new loss of sensation in the right upper and lower extremities, dysarthria, right facial droop, disorientation, and headache that started at 12:05.   On presentation code stroke was activated antiquing and was recommended but patient declined.  Patient was admitted to complete the stroke workup  CT head with chronic changes, no acute abnormality noted.  5/21: Vital stable, lipid panel with mildly elevated triglyceride at 177, HDL 31 and LDL 71, A1c of 5.9, rest of the labs unremarkable.Aspirin  was added to her home Plavix .  MRI brain with acute infarct in the left PCA territory, additional punctate acute lacunar infarct in the left thalamus. There were also subacute infarcts in the  right temporal and occipital white matter.  Concern of embolic stroke. MRA head and neck with likely occlusion of left PCA at the proximal P1 segment, otherwise patent intracranial vasculature.  Moderate chronic microvascular changes. Echocardiogram normal.  Neurology is recommending TEE Cardiology was consulted for TEE. PT is recommending home health which is ordered  Patient is wheelchair and bedbound at baseline.  5/22: TEE was scheduled for today but patient now refusing and wants to go home.  Neurology is not recommending DAPT with aspirin  and Plavix  for 3 months.  A ZIO monitor was also given and she will follow-up with cardiology for results to see any arrhythmia specially atrial fibrillation causing embolic stroke.  Patient will continue with home Plavix  after finishing 3 months of aspirin  and need to have a close follow-up with his neurologist and primary care provider for further recommendations.  She will continue home cholesterol as LDL seems controlled.  She will continue on current medications and follow-up with her providers for further assistance.  Assessment and Plan: * CVA (cerebral vascular accident) (HCC) MRI with multiple small infarcts involving different areas concerning for embolic stroke.  Completed stroke workup PT is recommending home health which was ordered Echocardiogram with bubble study was normal Neurology would like to get TEE-originally scheduled and then patient refused Cardiology was consulted -Baby aspirin  was added for 26-month -Continue with Plavix  and statin. -Continue supportive care  Hypotension Continue home midodrine   Anxiety Depression. - Continue home Wellbutrin  and Celexa   Chronic pain Continue Percocet  COPD (chronic obstructive pulmonary disease) (HCC) No concern for exacerbation. -Continue home bronchodilators  Diabetes mellitus without complication (HCC) A1c of 5.9.  Not on any medications -Continue to monitor  GERD  (gastroesophageal reflux disease) - Continue Protonix      Pain control - [place]  Controlled Substance Reporting System database was reviewed. and patient was instructed, not to drive, operate heavy machinery, perform activities at heights, swimming or participation in water  activities or provide baby-sitting services while on Pain, Sleep and Anxiety Medications; until their outpatient Physician has advised to do so again. Also recommended to not to take more than prescribed Pain, Sleep and Anxiety Medications.  Consultants: Neurology.  Cardiology Procedures performed: None Disposition: Home health Diet recommendation:  Discharge Diet Orders (From admission, onward)     Start     Ordered   06/04/23 0000  Diet - low sodium heart healthy        06/04/23 1044           Cardiac and Carb modified diet DISCHARGE MEDICATION: Allergies as of 06/04/2023       Reactions   Tramadol Nausea And Vomiting, Hypertension   Augmentin [amoxicillin-pot Clavulanate] Rash   Tolerated Zosyn  03/26/23        Medication List     STOP taking these medications    celecoxib 100 MG capsule Commonly known as: CELEBREX   ciprofloxacin  500 MG tablet Commonly known as: CIPRO    doxycycline 100 MG capsule Commonly known as: MONODOX   fluconazole  150 MG tablet Commonly known as: DIFLUCAN    Spikevax syringe Generic drug: COVID-19 mRNA vaccine       TAKE these medications    albuterol  108 (90 Base) MCG/ACT inhaler Commonly known as: VENTOLIN  HFA INHALE 2 INHALATIONS BY MOUTH  EVERY 4 HOURS AS NEEDED FOR  WHEEZING OR FOR SHORTNESS OF  BREATH   aspirin  EC 81 MG tablet Take 1 tablet (81 mg total) by mouth daily. Swallow whole. Start taking on: Jun 05, 2023   atorvastatin  40 MG tablet Commonly known as: LIPITOR TAKE 1 TABLET BY MOUTH AT  BEDTIME   azelastine  0.1 % nasal spray Commonly known as: ASTELIN  Place 2 sprays into both nostrils 2 (two) times daily.   buPROPion  150 MG 24 hr  tablet Commonly known as: WELLBUTRIN  XL Take 1 tablet (150 mg total) by mouth daily.   citalopram  40 MG tablet Commonly known as: CELEXA  TAKE 1 TABLET BY MOUTH DAILY   clopidogrel  75 MG tablet Commonly known as: PLAVIX  TAKE ONE TABLET BY MOUTH DAILY AT 9AM   cyclobenzaprine  10 MG tablet Commonly known as: FLEXERIL  Take 10 mg by mouth 3 (three) times daily.   fluticasone  50 MCG/ACT nasal spray Commonly known as: FLONASE  Place 2 sprays into both nostrils daily. Use for 4-6 weeks then stop and use seasonally or as needed.   gabapentin  600 MG tablet Commonly known as: NEURONTIN  TAKE 1 TABLET BY MOUTH 4 TIMES  DAILY   leptospermum manuka honey gel Apply 1 Application topically daily.   metFORMIN  1000 MG tablet Commonly known as: GLUCOPHAGE  TAKE 1 TABLET BY MOUTH TWICE  DAILY WITH MEALS   methocarbamol  500 MG tablet Commonly known as: ROBAXIN  Take 500 mg by mouth 3 (three) times daily.   midodrine  5 MG tablet Commonly known as: PROAMATINE  Take 1 tablet (5 mg total) by mouth 3 (three) times daily with meals.   Mounjaro  10 MG/0.5ML Pen Generic drug: tirzepatide  INJECT SUBCUTANEOUSLY 10 MG ONCE WEEKLY   mupirocin  ointment 2 % Commonly known as: BACTROBAN  Apply 1 Application topically 2 (two) times daily. For 10 days   nicotine  14 mg/24hr patch Commonly known as:  NICODERM CQ  - dosed in mg/24 hours Place onto the skin.   nystatin  powder Commonly known as: MYCOSTATIN /NYSTOP  APPLY 1 APPLICATION TOPICALLY THREE TIMES DAILY AS NEEDED FOR YEAST RASH INTERTRIGO   ondansetron  4 MG disintegrating tablet Commonly known as: ZOFRAN -ODT DISSOLVE 1 TABLET ON THE TONGUE EVERY 8 HOURS AS NEEDED FOR NAUSEA AND VOMITING   oxyCODONE -acetaminophen  10-325 MG tablet Commonly known as: PERCOCET Take 1 tablet by mouth every 6 (six) hours as needed for pain.   pantoprazole  40 MG tablet Commonly known as: PROTONIX  TAKE 1 TABLET BY MOUTH DAILY AT  9 AM   QUEtiapine  100 MG  tablet Commonly known as: SEROQUEL  TAKE 1 TABLET BY MOUTH DAILY AT  9 PM AT BEDTIME   topiramate  100 MG tablet Commonly known as: TOPAMAX  TAKE 1 TABLET BY MOUTH TWICE  DAILY   Trelegy Ellipta  100-62.5-25 MCG/ACT Aepb Generic drug: Fluticasone -Umeclidin-Vilant USE 1 INHALATION BY MOUTH ONCE  DAILY AT THE SAME TIME EACH DAY   trimethoprim -polymyxin b  ophthalmic solution Commonly known as: Polytrim  Place 1 drop into both eyes 4 (four) times daily. For 7-10 days   Vitamin D  (Ergocalciferol ) 1.25 MG (50000 UNIT) Caps capsule Commonly known as: DRISDOL  TAKE 1 CAPSULE BY MOUTH EVERY WEDNESDAY @9AM                Discharge Care Instructions  (From admission, onward)           Start     Ordered   06/04/23 0000  No dressing needed        06/04/23 1044            Follow-up Information     Raina Bunting, DO Follow up.   Specialty: Family Medicine Why: Hospital follow up Contact information: [address] [place] [address] [phone]         [facility]  Follow up.   Why: Agency will call to schedule visit. [address] [place] [address] [phone]                Discharge Exam: Cleavon Curls Weights   06/02/23 1324  Weight: 97.8 kg   General.  Obese lady, in no acute distress. Pulmonary.  Lungs clear bilaterally, normal respiratory effort. CV.  Regular rate and rhythm, no JVD, rub or murmur. Abdomen.  Soft, nontender, nondistended, BS positive. CNS.  Alert and oriented .  No new focal neurologic deficit. Extremities.  No edema, no cyanosis, pulses intact and symmetrical. Psychiatry.  Judgment and insight appears normal.   Condition at discharge: stable  The results of significant diagnostics from this hospitalization (including imaging, microbiology, ancillary and laboratory) are listed below for reference.   Imaging Studies: MR BRAIN WO CONTRAST Result Date:  06/03/2023 CLINICAL DATA:  Neuro deficit, concern for stroke, sudden onset right-sided weakness and facial droop. EXAM: MRI HEAD WITHOUT CONTRAST MRA HEAD WITHOUT CONTRAST MRA OF THE NECK WITHOUT AND WITH CONTRAST TECHNIQUE: Multiplanar, multi-echo pulse sequences of the brain and surrounding structures were acquired without intravenous contrast. Angiographic images of the Circle of Willis were acquired using MRA technique without intravenous contrast. Angiographic images of the neck were acquired using MRA technique without and with intravenous contrast. Carotid stenosis measurements (when applicable) are obtained utilizing NASCET criteria, using the distal internal carotid diameter as the denominator. CONTRAST:  10mL GADAVIST GADOBUTROL 1 MMOL/ML IV SOLN COMPARISON:  CT head 06/02/2023. FINDINGS: MR HEAD FINDINGS Brain: There is restricted diffusion within the left PCA territory involving the  posterior mesial left temporal lobe as well as the left occipital lobe. Restricted diffusion also involves the left hippocampus. Additional punctate focus of restricted diffusion within the left thalamus. There is a region of diffusion signal abnormality within the right temporal occipital white matter without ADC abnormality which could reflect subacute infarct. No evidence of intracranial hemorrhage. T2/FLAIR hyperintensity in the periventricular and subcortical white matter. Remote infarcts in the left corona radiata and left basal ganglia. There are likely additional small remote infarcts in the cerebellum. No midline shift. Posterior fossa is unremarkable. Normal appearance of midline structures. The basilar cisterns are patent. No extra-axial fluid collections. Ventricles: Normal size and configuration of the ventricles. Vascular: Skull base flow voids are visualized. Skull and upper cervical spine: No focal abnormality. Sinuses/Orbits: Orbits are symmetric. Paranasal sinuses are clear. Other: Mastoid air cells are  clear. MRA HEAD FINDINGS Anterior circulation: The visualized intracranial internal carotid arteries are patent. No significant stenosis of the ICAs appreciated. The right M1 segment is patent. Proximal M2 segments appear patent. Artifact slightly limits evaluation of smaller branches. The visualized distal MCA branches are unremarkable. Left M1 segment patent. Proximal M2 segments on the left also appear patent. Smaller branches are slightly limited due to artifact. Visualized distal left MCA branches are unremarkable. The A1 and A2 segments are patent bilaterally. Visualized distal ACA branches are unremarkable. Posterior circulation: The intracranial right vertebral artery is visualized. Diminutive intracranial left vertebral artery noted with tapering just proximal to the vertebrobasilar confluence. The basilar artery is patent. The right PCA is patent with fetal origin noted. The left PCA is not well visualized and is likely occluded at the proximal P1 segment. Bilateral superior cerebral arteries are patent. AICA patent proximally. There are branches of the PICA visualized bilaterally. Anatomic variants:Fetal origin of the right PCA. MRA NECK FINDINGS Aortic arch: The visualized aortic arch is patent and normal in caliber without evidence of dissection or aneurysm. Three vessel configuration of the aortic arch. The visualized subclavian arteries are patent. Right carotid system: The right carotid artery is patent from the origin to the skull base. Carotid bifurcation is patent without evidence of high-grade stenosis or significant atherosclerotic plaque visualized. Left carotid system: The left carotid artery is patent from the origin to the skull base. Carotid bifurcation is patent. No significant atherosclerosis or high-grade stenosis appreciated. Vertebral arteries: The vertebral arteries are patent from the origins to the intracranial segments. The right vertebral artery is dominant and is well visualized  to the vertebrobasilar confluence without high-grade stenosis or significant irregularity. Slightly limited evaluation of the non dominant left vertebral artery with diminutive intracranial segment noted. Other: None. IMPRESSION: Acute infarct in the left PCA territory as described above. Additional punctate acute lacunar infarct in the left thalamus. Region of diffusion signal abnormality in the right temporal occipital white matter suggestive of subacute infarct. There is likely occlusion of the left PCA at the proximal P1 segment. Intracranial arterial vasculature is otherwise patent proximally. Patent arterial vasculature in the neck. Moderate chronic microvascular ischemic changes. Remote infarcts in the left corona radiata extending into the basal ganglia. These results were called by telephone at the time of interpretation on 06/03/2023 at 3:04 pm to provider [facility], who verbally acknowledged these results. Electronically Signed   By: Denny Flack M.D.   On: 06/03/2023 15:04   MR ANGIO HEAD WO CONTRAST Result Date: 06/03/2023 CLINICAL DATA:  Neuro deficit, concern for stroke, sudden onset right-sided weakness and facial droop. EXAM: MRI HEAD WITHOUT  CONTRAST MRA HEAD WITHOUT CONTRAST MRA OF THE NECK WITHOUT AND WITH CONTRAST TECHNIQUE: Multiplanar, multi-echo pulse sequences of the brain and surrounding structures were acquired without intravenous contrast. Angiographic images of the Circle of Willis were acquired using MRA technique without intravenous contrast. Angiographic images of the neck were acquired using MRA technique without and with intravenous contrast. Carotid stenosis measurements (when applicable) are obtained utilizing NASCET criteria, using the distal internal carotid diameter as the denominator. CONTRAST:  10mL GADAVIST GADOBUTROL 1 MMOL/ML IV SOLN COMPARISON:  CT head 06/02/2023. FINDINGS: MR HEAD FINDINGS Brain: There is restricted diffusion within the left PCA territory  involving the posterior mesial left temporal lobe as well as the left occipital lobe. Restricted diffusion also involves the left hippocampus. Additional punctate focus of restricted diffusion within the left thalamus. There is a region of diffusion signal abnormality within the right temporal occipital white matter without ADC abnormality which could reflect subacute infarct. No evidence of intracranial hemorrhage. T2/FLAIR hyperintensity in the periventricular and subcortical white matter. Remote infarcts in the left corona radiata and left basal ganglia. There are likely additional small remote infarcts in the cerebellum. No midline shift. Posterior fossa is unremarkable. Normal appearance of midline structures. The basilar cisterns are patent. No extra-axial fluid collections. Ventricles: Normal size and configuration of the ventricles. Vascular: Skull base flow voids are visualized. Skull and upper cervical spine: No focal abnormality. Sinuses/Orbits: Orbits are symmetric. Paranasal sinuses are clear. Other: Mastoid air cells are clear. MRA HEAD FINDINGS Anterior circulation: The visualized intracranial internal carotid arteries are patent. No significant stenosis of the ICAs appreciated. The right M1 segment is patent. Proximal M2 segments appear patent. Artifact slightly limits evaluation of smaller branches. The visualized distal MCA branches are unremarkable. Left M1 segment patent. Proximal M2 segments on the left also appear patent. Smaller branches are slightly limited due to artifact. Visualized distal left MCA branches are unremarkable. The A1 and A2 segments are patent bilaterally. Visualized distal ACA branches are unremarkable. Posterior circulation: The intracranial right vertebral artery is visualized. Diminutive intracranial left vertebral artery noted with tapering just proximal to the vertebrobasilar confluence. The basilar artery is patent. The right PCA is patent with fetal origin noted. The  left PCA is not well visualized and is likely occluded at the proximal P1 segment. Bilateral superior cerebral arteries are patent. AICA patent proximally. There are branches of the PICA visualized bilaterally. Anatomic variants:Fetal origin of the right PCA. MRA NECK FINDINGS Aortic arch: The visualized aortic arch is patent and normal in caliber without evidence of dissection or aneurysm. Three vessel configuration of the aortic arch. The visualized subclavian arteries are patent. Right carotid system: The right carotid artery is patent from the origin to the skull base. Carotid bifurcation is patent without evidence of high-grade stenosis or significant atherosclerotic plaque visualized. Left carotid system: The left carotid artery is patent from the origin to the skull base. Carotid bifurcation is patent. No significant atherosclerosis or high-grade stenosis appreciated. Vertebral arteries: The vertebral arteries are patent from the origins to the intracranial segments. The right vertebral artery is dominant and is well visualized to the vertebrobasilar confluence without high-grade stenosis or significant irregularity. Slightly limited evaluation of the non dominant left vertebral artery with diminutive intracranial segment noted. Other: None. IMPRESSION: Acute infarct in the left PCA territory as described above. Additional punctate acute lacunar infarct in the left thalamus. Region of diffusion signal abnormality in the right temporal occipital white matter suggestive of subacute infarct. There is likely  occlusion of the left PCA at the proximal P1 segment. Intracranial arterial vasculature is otherwise patent proximally. Patent arterial vasculature in the neck. Moderate chronic microvascular ischemic changes. Remote infarcts in the left corona radiata extending into the basal ganglia. These results were called by telephone at the time of interpretation on 06/03/2023 at 3:04 pm to provider [facility], who  verbally acknowledged these results. Electronically Signed   By: Denny Flack M.D.   On: 06/03/2023 15:04   MR ANGIO NECK W WO CONTRAST Result Date: 06/03/2023 CLINICAL DATA:  Neuro deficit, concern for stroke, sudden onset right-sided weakness and facial droop. EXAM: MRI HEAD WITHOUT CONTRAST MRA HEAD WITHOUT CONTRAST MRA OF THE NECK WITHOUT AND WITH CONTRAST TECHNIQUE: Multiplanar, multi-echo pulse sequences of the brain and surrounding structures were acquired without intravenous contrast. Angiographic images of the Circle of Willis were acquired using MRA technique without intravenous contrast. Angiographic images of the neck were acquired using MRA technique without and with intravenous contrast. Carotid stenosis measurements (when applicable) are obtained utilizing NASCET criteria, using the distal internal carotid diameter as the denominator. CONTRAST:  10mL GADAVIST GADOBUTROL 1 MMOL/ML IV SOLN COMPARISON:  CT head 06/02/2023. FINDINGS: MR HEAD FINDINGS Brain: There is restricted diffusion within the left PCA territory involving the posterior mesial left temporal lobe as well as the left occipital lobe. Restricted diffusion also involves the left hippocampus. Additional punctate focus of restricted diffusion within the left thalamus. There is a region of diffusion signal abnormality within the right temporal occipital white matter without ADC abnormality which could reflect subacute infarct. No evidence of intracranial hemorrhage. T2/FLAIR hyperintensity in the periventricular and subcortical white matter. Remote infarcts in the left corona radiata and left basal ganglia. There are likely additional small remote infarcts in the cerebellum. No midline shift. Posterior fossa is unremarkable. Normal appearance of midline structures. The basilar cisterns are patent. No extra-axial fluid collections. Ventricles: Normal size and configuration of the ventricles. Vascular: Skull base flow voids are visualized.  Skull and upper cervical spine: No focal abnormality. Sinuses/Orbits: Orbits are symmetric. Paranasal sinuses are clear. Other: Mastoid air cells are clear. MRA HEAD FINDINGS Anterior circulation: The visualized intracranial internal carotid arteries are patent. No significant stenosis of the ICAs appreciated. The right M1 segment is patent. Proximal M2 segments appear patent. Artifact slightly limits evaluation of smaller branches. The visualized distal MCA branches are unremarkable. Left M1 segment patent. Proximal M2 segments on the left also appear patent. Smaller branches are slightly limited due to artifact. Visualized distal left MCA branches are unremarkable. The A1 and A2 segments are patent bilaterally. Visualized distal ACA branches are unremarkable. Posterior circulation: The intracranial right vertebral artery is visualized. Diminutive intracranial left vertebral artery noted with tapering just proximal to the vertebrobasilar confluence. The basilar artery is patent. The right PCA is patent with fetal origin noted. The left PCA is not well visualized and is likely occluded at the proximal P1 segment. Bilateral superior cerebral arteries are patent. AICA patent proximally. There are branches of the PICA visualized bilaterally. Anatomic variants:Fetal origin of the right PCA. MRA NECK FINDINGS Aortic arch: The visualized aortic arch is patent and normal in caliber without evidence of dissection or aneurysm. Three vessel configuration of the aortic arch. The visualized subclavian arteries are patent. Right carotid system: The right carotid artery is patent from the origin to the skull base. Carotid bifurcation is patent without evidence of high-grade stenosis or significant atherosclerotic plaque visualized. Left carotid system: The left carotid artery is  patent from the origin to the skull base. Carotid bifurcation is patent. No significant atherosclerosis or high-grade stenosis appreciated. Vertebral  arteries: The vertebral arteries are patent from the origins to the intracranial segments. The right vertebral artery is dominant and is well visualized to the vertebrobasilar confluence without high-grade stenosis or significant irregularity. Slightly limited evaluation of the non dominant left vertebral artery with diminutive intracranial segment noted. Other: None. IMPRESSION: Acute infarct in the left PCA territory as described above. Additional punctate acute lacunar infarct in the left thalamus. Region of diffusion signal abnormality in the right temporal occipital white matter suggestive of subacute infarct. There is likely occlusion of the left PCA at the proximal P1 segment. Intracranial arterial vasculature is otherwise patent proximally. Patent arterial vasculature in the neck. Moderate chronic microvascular ischemic changes. Remote infarcts in the left corona radiata extending into the basal ganglia. These results were called by telephone at the time of interpretation on 06/03/2023 at 3:04 pm to provider [facility], who verbally acknowledged these results. Electronically Signed   By: Denny Flack M.D.   On: 06/03/2023 15:04   ECHOCARDIOGRAM COMPLETE BUBBLE STUDY Result Date: 06/03/2023    ECHOCARDIOGRAM REPORT   Patient Name:   [name] Date of Exam: 06/03/2023 Medical Rec #:  [id number]      Height:       66.0 in Accession #:    [id number]     Weight:       215.5 lb Date of Birth:  11-14-1965       BSA:          2.064 m Patient Age:    57 years       BP:           109/79 mmHg Patient Gender: F              HR:           80 bpm. Exam Location:  [facility] Procedure: 2D Echo, Cardiac Doppler, Color Doppler and Saline Contrast Bubble            Study (Both Spectral and Color Flow Doppler were utilized during            procedure). Indications:     Stroke 434.91 / I63.9  History:         Patient has prior history of Echocardiogram examinations, most                  recent 03/29/2023. COPD and Stroke;  Risk Factors:Diabetes and                  Hypertension.  Sonographer:     Broadus Canes Referring Phys:  [id number] Alphonza Ashing STACK Diagnosing Phys: Antonette Batters MD  Sonographer Comments: Technically challenging study due to limited acoustic windows and no parasternal window. Image acquisition challenging due to COPD. IMPRESSIONS  1. Negative Bubble Study.  2. TDS techically difficult study.  3. Left ventricular ejection fraction, by estimation, is 50 to 55%. The left ventricle has low normal function. The left ventricle has no regional wall motion abnormalities. Left ventricular diastolic parameters were normal.  4. Right ventricular systolic function is normal. The right ventricular size is normal.  5. The mitral valve is normal in structure. No evidence of mitral valve regurgitation.  6. The aortic valve is normal in structure. Aortic valve regurgitation is not visualized. FINDINGS  Left Ventricle: Left ventricular ejection fraction, by estimation, is 50 to 55%. The left ventricle  has low normal function. The left ventricle has no regional wall motion abnormalities. Strain was performed and the global longitudinal strain is indeterminate. Global longitudinal strain performed but not reported based on interpreter judgement due to suboptimal tracking. The left ventricular internal cavity size was normal in size. There is borderline asymmetric left ventricular hypertrophy. Left ventricular diastolic parameters were normal. Right Ventricle: The right ventricular size is normal. No increase in right ventricular wall thickness. Right ventricular systolic function is normal. Left Atrium: Left atrial size was normal in size. Right Atrium: Right atrial size was normal in size. Pericardium: There is no evidence of pericardial effusion. Mitral Valve: The mitral valve is normal in structure. No evidence of mitral valve regurgitation. Tricuspid Valve: The tricuspid valve is normal in structure. Tricuspid valve regurgitation is  not demonstrated. Aortic Valve: The aortic valve is normal in structure. Aortic valve regurgitation is not visualized. Aortic valve mean gradient measures 3.0 mmHg. Aortic valve peak gradient measures 5.5 mmHg. Aortic valve area, by VTI measures 2.26 cm. Pulmonic Valve: The pulmonic valve was normal in structure. Pulmonic valve regurgitation is not visualized. Aorta: The ascending aorta was not well visualized. IAS/Shunts: No atrial level shunt detected by color flow Doppler. Agitated saline contrast was given intravenously to evaluate for intracardiac shunting. Additional Comments: Negative Bubble Study. TDS techically difficult study. 3D was performed not requiring image post processing on an independent workstation and was indeterminate.  LEFT VENTRICLE PLAX 2D LVIDd:         3.40 cm   Diastology LVIDs:         2.50 cm   LV e' medial:    5.11 cm/s LV PW:         0.70 cm   LV E/e' medial:  13.6 LV IVS:        1.20 cm   LV e' lateral:   10.10 cm/s LVOT diam:     2.00 cm   LV E/e' lateral: 6.9 LV SV:         49 LV SV Index:   24 LVOT Area:     3.14 cm  RIGHT VENTRICLE RV Basal diam:  2.90 cm RV Mid diam:    2.40 cm LEFT ATRIUM           Index        RIGHT ATRIUM           Index LA diam:      3.50 cm 1.70 cm/m   RA Area:     11.20 cm LA Vol (A2C): 41.0 ml 19.86 ml/m  RA Volume:   24.40 ml  11.82 ml/m LA Vol (A4C): 22.1 ml 10.71 ml/m  AORTIC VALVE AV Area (Vmax):    2.29 cm AV Area (Vmean):   2.38 cm AV Area (VTI):     2.26 cm AV Vmax:           117.00 cm/s AV Vmean:          75.700 cm/s AV VTI:            0.218 m AV Peak Grad:      5.5 mmHg AV Mean Grad:      3.0 mmHg LVOT Vmax:         85.40 cm/s LVOT Vmean:        57.300 cm/s LVOT VTI:          0.157 m LVOT/AV VTI ratio: 0.72  AORTA Ao Root diam: 2.20 cm MITRAL VALVE MV Area (PHT): 4.26 cm  SHUNTS MV Decel Time: 178 msec     Systemic VTI:  0.16 m MV E velocity: 69.70 cm/s   Systemic Diam: 2.00 cm MV A velocity: 102.00 cm/s MV E/A ratio:  0.68 Dwayne D  Callwood MD Electronically signed by Antonette Batters MD Signature Date/Time: 06/03/2023/1:12:55 PM    Final    CT HEAD CODE STROKE WO CONTRAST Result Date: 06/02/2023 CLINICAL DATA:  Code stroke. Provided history: Neuro deficit, acute, stroke suspected. EXAM: CT HEAD WITHOUT CONTRAST TECHNIQUE: Contiguous axial images were obtained from the base of the skull through the vertex without intravenous contrast. RADIATION DOSE REDUCTION: This exam was performed according to the departmental dose-optimization program which includes automated exposure control, adjustment of the mA and/or kV according to patient size and/or use of iterative reconstruction technique. COMPARISON:  Brain MRI 12/09/2021. FINDINGS: Brain: No age-advanced or lobar predominant cerebral atrophy. Chronic infarcts within the left cerebral hemispheric white matter and within/about the left basal ganglia, not appreciably changed from the prior brain MRI of 12/09/2021. There is no acute intracranial hemorrhage. No demarcated cortical infarct. No extra-axial fluid collection. No evidence of an intracranial mass. No midline shift. Vascular: No hyperdense vessel.  Atherosclerotic calcifications. Skull: No calvarial fracture or aggressive osseous lesion. Sinuses/Orbits: No acute finding within the imaged orbits. No significant paranasal sinus disease at the imaged levels. ASPECTS [facility] Stroke Program Early CT Score) - Ganglionic level infarction (caudate, lentiform nuclei, internal capsule, insula, M1-M3 cortex): 7 - Supraganglionic infarction (M4-M6 cortex): 3 Total score (0-10 with 10 being normal): 10 (when discounting chronic infarcts). No acute intracranial finding. These results were communicated to Dr. Doretta Gant At 1:21 pmon 5/20/2025by text page via the [facility] messaging system. IMPRESSION: 1. No acute intracranial finding. 2. Unchanged chronic infarcts within the left cerebral hemispheric white matter and within/about the left basal ganglia.  Electronically Signed   By: Bascom Lily D.O.   On: 06/02/2023 13:22    Microbiology: Results for orders placed or performed during the hospital encounter of 03/25/23  Culture, blood (routine x 2)     Status: None   Collection Time: 03/25/23  6:21 PM   Specimen: BLOOD RIGHT ARM  Result Value Ref Range Status   Specimen Description BLOOD RIGHT ARM  Final   Special Requests   Final    BOTTLES DRAWN AEROBIC AND ANAEROBIC Blood Culture adequate volume   Culture   Final    NO GROWTH 5 DAYS Performed at [facility], [address] [place], [place] [address]    Report Status 03/30/2023 FINAL  Final  Culture, blood (routine x 2)     Status: None   Collection Time: 03/25/23  7:56 PM   Specimen: BLOOD  Result Value Ref Range Status   Specimen Description BLOOD BLOOD LEFT ARM  Final   Special Requests   Final    BOTTLES DRAWN AEROBIC AND ANAEROBIC Blood Culture results may not be optimal due to an inadequate volume of blood received in culture bottles   Culture   Final    NO GROWTH 5 DAYS Performed at [facility], [address] [place], [place] [address]    Report Status 03/30/2023 FINAL  Final  MRSA Next Gen by PCR, Nasal     Status: None   Collection Time: 04/01/23  6:49 PM   Specimen: Nasal Mucosa; Nasal Swab  Result Value Ref Range Status   MRSA by PCR Next Gen NOT DETECTED NOT DETECTED Final    Comment: (NOTE) The GeneXpert MRSA  Assay (FDA approved for NASAL specimens only), is one component of a comprehensive MRSA colonization surveillance program. It is not intended to diagnose MRSA infection nor to guide or monitor treatment for MRSA infections. Test performance is not FDA approved in patients less than 63 years old. Performed at [facility], [address] [place], [place] [address]     Labs: CBC: Recent Labs  Lab 06/02/23 1255 06/02/23 1750  WBC 10.3 9.9  NEUTROABS 5.0  --   HGB 13.5 13.3  HCT 42.2 41.5  MCV 89.8 89.6  PLT  329 316   Basic Metabolic Panel: Recent Labs  Lab 06/02/23 1255 06/02/23 1750  NA 136  --   K 3.5  --   CL 102  --   CO2 23  --   GLUCOSE 100*  --   BUN 17  --   CREATININE 0.70 0.72  CALCIUM  9.0  --    Liver Function Tests: Recent Labs  Lab 06/02/23 1255  AST 12*  ALT 8  ALKPHOS 52  BILITOT 0.4  PROT 7.1  ALBUMIN 3.5   CBG: Recent Labs  Lab 06/02/23 1255  GLUCAP 115*    Discharge time spent: greater than 30 minutes.  This record has been created using Conservation officer, historic buildings. Errors have been sought and corrected,but may not always be located. Such creation errors do not reflect on the standard of care.   Signed: Luna Salinas, MD [facility] 06/04/2023

## 2023-06-04 NOTE — Progress Notes (Signed)
       CROSS COVER NOTE  NAME: [name] MRN: [id number] DOB : October 21, 1965    Concern as stated by nurse / staff   Pt is here for a CVA, she is supposed to be getting a Transesophageal Echocardiogram in the morning. She is refusing to go NPO and no longer wishes to have the procedure. I will let day shift know also, I was just making you aware.      Pertinent findings on chart review:   Assessment and  Interventions   Assessment:  Patient refusing to have TEE per above note.  Plan: Tried allowing patient to eat and to go n.p.o. from 2 AM but continues to refuse Will leave n.p.o. from 2 AM just in case she changes her mind in the a.m. X

## 2023-06-04 NOTE — Progress Notes (Signed)
 EP notified of patient with cryptogenic stroke who will be discharging today. She has refused TEE.  Ambulatory monitor ordered, will schedule follow-up with me in about 6 weeks to review monitor results.    Cleo Santucci, NP Electrophysiology 06/04/23 12:07 PM

## 2023-06-04 NOTE — Plan of Care (Signed)
 Neurology plan of care  Please see my consult note from 06/02/23 for initial findings and recommendations.   [name] is a 58 y.o. female with hx of hypertension, hyperlipidemia, diabetes, peripheral artery disease, previous stroke with right-sided deficits who presented to the ED for new RUE ataxia and R visual and sensory hemineglect. She was offered TNK but declined.   Results of stroke workup:  MRI brain, MRA H&N  Acute infarct in the left PCA territory as described above. Additional punctate acute lacunar infarct in the left thalamus.   Region of diffusion signal abnormality in the right temporal occipital white matter suggestive of subacute infarct.   There is likely occlusion of the left PCA at the proximal P1 segment. Intracranial arterial vasculature is otherwise patent proximally.   Patent arterial vasculature in the neck.   Moderate chronic microvascular ischemic changes. Remote infarcts in the left corona radiata extending into the basal ganglia.  TTE 1. Negative Bubble Study. 2. TDS techically difficult study. 3. Left ventricular ejection fraction, by estimation, is 50 to 55% . The left ventricle has low normal function. The left ventricle has no regional wall motion abnormalities. Left ventricular diastolic parameters were normal. 4. Right ventricular systolic function is normal. The right ventricular size is normal. 5. The mitral valve is normal in structure. No evidence of mitral valve regurgitation. 6. The aortic valve is normal in structure. Aortic valve regurgitation is not visualized.  Stroke Labs     Component Value Date/Time   CHOL 137 06/03/2023 0426   CHOL 207 (H) 10/07/2012 0423   TRIG 177 (H) 06/03/2023 0426   TRIG 273 (H) 10/07/2012 0423   HDL 31 (L) 06/03/2023 0426   HDL 25 (L) 10/07/2012 0423   CHOLHDL 4.4 06/03/2023 0426   VLDL 35 06/03/2023 0426   VLDL 55 (H) 10/07/2012 0423   LDLCALC 71 06/03/2023 0426   LDLCALC 127 (H) 10/07/2012 0423     Lab Results  Component Value Date/Time   HGBA1C 5.9 (H) 06/02/2023 12:55 PM    Etiology of stroke favored to be embolic given findings of subacute contralateral infarct. (MRI brain this admission showed acute infarct L PCA territory as well as probable subacute infarct in R temporo-occipital area. I recommended TEE for further workup of possible central embolic source. Patient initially agreed but last night decided she did not wish to undergo the procedure. Therefore inpatient stroke work up is now completed.  Final recommendations: - ASA 81mg  daily + plavix  75mg  daily x90 days f/b plavix  75mg  daily monotherapy after that  - Continue home atorvastatin  40mg  daily; LDL is at goal - Ambulatory cardiac monitor to be placed before discharge - I will arrange outpatient neurology f/u  OK to discharge from hospital today. Neurology will be available prn for questions going forward.  Greg Leaks, MD [facility] [phone]  If 7pm- 7am, please page neurology on call as listed in AMION.

## 2023-06-04 NOTE — Telephone Encounter (Signed)
 error

## 2023-06-04 NOTE — Progress Notes (Signed)
 Patient is supposed to have Transesophageal Echocardiogram today (5/22), refused to go NPO at midnight. Offered for her to be NPO at 2 am per Dr. Vallarie Gauze but patient still refused stating she no longer wants the procedure because the doctor never came to see her and explained the procedure to her. Also stated she will not be going NPO while waiting for the doctor to come see her in the morning.

## 2023-06-04 NOTE — Progress Notes (Signed)
 Patient discharging home, all belongings sent with patient. IV removed and patient husband to beside to transport patient home. Patients own wheelchair used to transport patient. All questions answered and education provided.

## 2023-06-05 DIAGNOSIS — M129 Arthropathy, unspecified: Secondary | ICD-10-CM | POA: Diagnosis not present

## 2023-06-05 DIAGNOSIS — M542 Cervicalgia: Secondary | ICD-10-CM | POA: Diagnosis not present

## 2023-06-05 DIAGNOSIS — G8929 Other chronic pain: Secondary | ICD-10-CM | POA: Diagnosis not present

## 2023-06-05 DIAGNOSIS — M5416 Radiculopathy, lumbar region: Secondary | ICD-10-CM | POA: Diagnosis not present

## 2023-06-05 DIAGNOSIS — M25571 Pain in right ankle and joints of right foot: Secondary | ICD-10-CM | POA: Diagnosis not present

## 2023-06-05 DIAGNOSIS — I639 Cerebral infarction, unspecified: Secondary | ICD-10-CM | POA: Diagnosis not present

## 2023-06-05 DIAGNOSIS — Z9181 History of falling: Secondary | ICD-10-CM | POA: Diagnosis not present

## 2023-06-05 DIAGNOSIS — Z79899 Other long term (current) drug therapy: Secondary | ICD-10-CM | POA: Diagnosis not present

## 2023-06-05 NOTE — Telephone Encounter (Signed)
 Requested Prescriptions  Pending Prescriptions Disp Refills   buPROPion  (WELLBUTRIN  XL) 150 MG 24 hr tablet [Pharmacy Med Name: buPROPion  HCl ER (XL) 150 MG Oral Tablet Extended Release 24 Hour] 100 tablet 0    Sig: TAKE 1 TABLET BY MOUTH DAILY     Psychiatry: Antidepressants - bupropion  Failed - 06/05/2023  5:58 PM      Failed - AST in normal range and within 360 days    AST  Date Value Ref Range Status  06/02/2023 12 (L) 15 - 41 U/L Final   SGOT(AST)  Date Value Ref Range Status  09/22/2013 17 15 - 37 Unit/L Final         Passed - Cr in normal range and within 360 days    Creat  Date Value Ref Range Status  05/15/2021 0.62 0.50 - 1.03 mg/dL Final   Creatinine, Ser  Date Value Ref Range Status  06/02/2023 0.72 0.44 - 1.00 mg/dL Final         Passed - ALT in normal range and within 360 days    ALT  Date Value Ref Range Status  06/02/2023 8 0 - 44 U/L Final   SGPT (ALT)  Date Value Ref Range Status  09/22/2013 16 U/L Final    Comment:    14-63 NOTE: New Reference Range 08/02/13          Passed - Completed PHQ-2 or PHQ-9 in the last 360 days      Passed - Last BP in normal range    BP Readings from Last 1 Encounters:  06/04/23 108/66         Passed - Valid encounter within last 6 months    Recent Outpatient Visits           3 months ago Acute conjunctivitis of both eyes, unspecified acute conjunctivitis type   [facility], Kayleen Party, [place]               clopidogrel  (PLAVIX ) 75 MG tablet [Pharmacy Med Name: Clopidogrel  Bisulfate 75 MG Oral Tablet] 100 tablet 1    Sig: TAKE 1 TABLET BY MOUTH DAILY AT  9AM     Hematology: Antiplatelets - clopidogrel  Passed - 06/05/2023  5:58 PM      Passed - HCT in normal range and within 180 days    HCT  Date Value Ref Range Status  06/02/2023 41.5 36.0 - 46.0 % Final  09/22/2013 44.3 35.0 - 47.0 % Final         Passed - HGB in normal range and within 180 days    Hemoglobin   Date Value Ref Range Status  06/02/2023 13.3 12.0 - 15.0 g/dL Final   HGB  Date Value Ref Range Status  09/22/2013 14.6 12.0 - 16.0 g/dL Final         Passed - PLT in normal range and within 180 days    Platelets  Date Value Ref Range Status  06/02/2023 316 150 - 400 K/uL Final   Platelet  Date Value Ref Range Status  09/22/2013 300 150 - 440 x10 3/mm 3 Final         Passed - Cr in normal range and within 360 days    Creat  Date Value Ref Range Status  05/15/2021 0.62 0.50 - 1.03 mg/dL Final   Creatinine, Ser  Date Value Ref Range Status  06/02/2023 0.72 0.44 - 1.00 mg/dL Final         Passed -  Valid encounter within last 6 months    Recent Outpatient Visits           3 months ago Acute conjunctivitis of both eyes, unspecified acute conjunctivitis type   [facility] [place], Kayleen Party, [place]

## 2023-06-07 ENCOUNTER — Other Ambulatory Visit: Payer: Self-pay | Admitting: Family Medicine

## 2023-06-07 DIAGNOSIS — G894 Chronic pain syndrome: Secondary | ICD-10-CM

## 2023-06-10 NOTE — Telephone Encounter (Signed)
 Requested Prescriptions  Pending Prescriptions Disp Refills   gabapentin  (NEURONTIN ) 600 MG tablet [Pharmacy Med Name: Gabapentin  600 MG Oral Tablet] 400 tablet 2    Sig: TAKE 1 TABLET BY MOUTH 4 TIMES  DAILY     Neurology: Anticonvulsants - gabapentin  Passed - 06/10/2023  4:18 PM      Passed - Cr in normal range and within 360 days    Creat  Date Value Ref Range Status  05/15/2021 0.62 0.50 - 1.03 mg/dL Final   Creatinine, Ser  Date Value Ref Range Status  06/02/2023 0.72 0.44 - 1.00 mg/dL Final         Passed - Completed PHQ-2 or PHQ-9 in the last 360 days      Passed - Valid encounter within last 12 months    Recent Outpatient Visits           3 months ago Acute conjunctivitis of both eyes, unspecified acute conjunctivitis type   [facility] [place], Kayleen Party, [place]

## 2023-06-13 ENCOUNTER — Other Ambulatory Visit: Payer: Self-pay | Admitting: Family Medicine

## 2023-06-13 DIAGNOSIS — F5104 Psychophysiologic insomnia: Secondary | ICD-10-CM

## 2023-06-13 DIAGNOSIS — G43711 Chronic migraine without aura, intractable, with status migrainosus: Secondary | ICD-10-CM

## 2023-06-15 ENCOUNTER — Inpatient Hospital Stay: Admitting: Family Medicine

## 2023-06-15 NOTE — Telephone Encounter (Signed)
 Rx 10/07/22 #200 [id number]- too soon Requested Prescriptions  Pending Prescriptions Disp Refills   topiramate  (TOPAMAX ) 100 MG tablet [Pharmacy Med Name: Topiramate  100 MG Oral Tablet] 200 tablet 2    Sig: TAKE 1 TABLET BY MOUTH TWICE  DAILY     Neurology: Anticonvulsants - topiramate  & zonisamide Failed - 06/15/2023  3:46 PM      Failed - AST in normal range and within 360 days    AST  Date Value Ref Range Status  06/02/2023 12 (L) 15 - 41 U/L Final   SGOT(AST)  Date Value Ref Range Status  09/22/2013 17 15 - 37 Unit/L Final         Passed - Cr in normal range and within 360 days    Creat  Date Value Ref Range Status  05/15/2021 0.62 0.50 - 1.03 mg/dL Final   Creatinine, Ser  Date Value Ref Range Status  06/02/2023 0.72 0.44 - 1.00 mg/dL Final         Passed - CO2 in normal range and within 360 days    CO2  Date Value Ref Range Status  06/02/2023 23 22 - 32 mmol/L Final   Co2  Date Value Ref Range Status  09/22/2013 27 21 - 32 mmol/L Final   Bicarbonate  Date Value Ref Range Status  12/08/2021 24.6 20.0 - 28.0 mmol/L Final         Passed - ALT in normal range and within 360 days    ALT  Date Value Ref Range Status  06/02/2023 8 0 - 44 U/L Final   SGPT (ALT)  Date Value Ref Range Status  09/22/2013 16 U/L Final    Comment:    14-63 NOTE: New Reference Range 08/02/13          Passed - Completed PHQ-2 or PHQ-9 in the last 360 days      Passed - Valid encounter within last 12 months    Recent Outpatient Visits           3 months ago Acute conjunctivitis of both eyes, unspecified acute conjunctivitis type   [place] [facility] [place], Kayleen Party, DO               QUEtiapine  (SEROQUEL ) 100 MG tablet [Pharmacy Med Name: QUEtiapine  Fumarate 100 MG Oral Tablet] 100 tablet 2    Sig: TAKE 1 TABLET BY MOUTH DAILY AT  9 PM AT BEDTIME     Not Delegated - Psychiatry:  Antipsychotics - Second Generation (Atypical) - quetiapine  Failed -  06/15/2023  3:46 PM      Failed - This refill cannot be delegated      Failed - Lipid Panel in normal range within the last 12 months    Cholesterol  Date Value Ref Range Status  06/03/2023 137 0 - 200 mg/dL Final  16/10/9602 540 (H) 0 - 200 mg/dL Final   Ldl Cholesterol, Calc  Date Value Ref Range Status  10/07/2012 127 (H) 0 - 100 mg/dL Final   LDL Cholesterol  Date Value Ref Range Status  06/03/2023 71 0 - 99 mg/dL Final    Comment:           Total Cholesterol/HDL:CHD Risk Coronary Heart Disease Risk Table                     Men   Women  1/2 Average Risk   3.4   3.3  Average Risk  5.0   4.4  2 X Average Risk   9.6   7.1  3 X Average Risk  23.4   11.0        Use the calculated Patient Ratio above and the CHD Risk Table to determine the patient's CHD Risk.        ATP III CLASSIFICATION (LDL):  <100     mg/dL   Optimal  161-096  mg/dL   Near or Above                    Optimal  130-159  mg/dL   Borderline  045-409  mg/dL   High  >811     mg/dL   Very High Performed at [facility], [address] [place], [place] [address]    HDL Cholesterol  Date Value Ref Range Status  10/07/2012 25 (L) 40 - 60 mg/dL Final   HDL  Date Value Ref Range Status  06/03/2023 31 (L) >40 mg/dL Final   Triglycerides  Date Value Ref Range Status  06/03/2023 177 (H) <150 mg/dL Final  29/56/2130 865 (H) 0 - 200 mg/dL Final         Failed - CMP within normal limits and completed in the last 12 months    Albumin  Date Value Ref Range Status  06/02/2023 3.5 3.5 - 5.0 g/dL Final  78/46/9629 2.6 (L) 3.4 - 5.0 g/dL Final   Alkaline Phosphatase  Date Value Ref Range Status  06/02/2023 52 38 - 126 U/L Final  09/22/2013 78 Unit/L Final    Comment:    46-116 NOTE: New Reference Range 08/02/13    ALT  Date Value Ref Range Status  06/02/2023 8 0 - 44 U/L Final   SGPT (ALT)  Date Value Ref Range Status  09/22/2013 16 U/L Final    Comment:    14-63 NOTE: New  Reference Range 08/02/13    AST  Date Value Ref Range Status  06/02/2023 12 (L) 15 - 41 U/L Final   SGOT(AST)  Date Value Ref Range Status  09/22/2013 17 15 - 37 Unit/L Final   BUN  Date Value Ref Range Status  06/02/2023 17 6 - 20 mg/dL Final  52/84/1324 11 7 - 18 mg/dL Final   Calcium   Date Value Ref Range Status  06/02/2023 9.0 8.9 - 10.3 mg/dL Final   Calcium , Total  Date Value Ref Range Status  09/22/2013 8.3 (L) 8.5 - 10.1 mg/dL Final   Calcium , Ion  Date Value Ref Range Status  04/07/2017 1.17 1.15 - 1.40 mmol/L Final   CO2  Date Value Ref Range Status  06/02/2023 23 22 - 32 mmol/L Final   Co2  Date Value Ref Range Status  09/22/2013 27 21 - 32 mmol/L Final   Bicarbonate  Date Value Ref Range Status  12/08/2021 24.6 20.0 - 28.0 mmol/L Final   TCO2  Date Value Ref Range Status  04/07/2017 29 22 - 32 mmol/L Final   Creat  Date Value Ref Range Status  05/15/2021 0.62 0.50 - 1.03 mg/dL Final   Creatinine, Ser  Date Value Ref Range Status  06/02/2023 0.72 0.44 - 1.00 mg/dL Final   Glucose  Date Value Ref Range Status  09/22/2013 335 (H) 65 - 99 mg/dL Final   Glucose, Bld  Date Value Ref Range Status  06/02/2023 100 (H) 70 - 99 mg/dL Final    Comment:    Glucose reference range applies only to samples taken after fasting  for at least 8 hours.   Glucose-Capillary  Date Value Ref Range Status  06/02/2023 115 (H) 70 - 99 mg/dL Final    Comment:    Glucose reference range applies only to samples taken after fasting for at least 8 hours.   Potassium  Date Value Ref Range Status  06/02/2023 3.5 3.5 - 5.1 mmol/L Final  09/22/2013 3.8 3.5 - 5.1 mmol/L Final   Sodium  Date Value Ref Range Status  06/02/2023 136 135 - 145 mmol/L Final  09/22/2013 135 (L) 136 - 145 mmol/L Final   Total Bilirubin  Date Value Ref Range Status  06/02/2023 0.4 0.0 - 1.2 mg/dL Final   Bilirubin,Total  Date Value Ref Range Status  09/22/2013 0.2 0.2 - 1.0 mg/dL  Final   Bilirubin, Direct  Date Value Ref Range Status  08/28/2012 < 0.1 0.00 - 0.20 mg/dL Final   Protein, ur  Date Value Ref Range Status  12/08/2021 30 (A) NEGATIVE mg/dL Final   Protein,UA  Date Value Ref Range Status  09/08/2022 Negative Negative/Trace Final   Total Protein  Date Value Ref Range Status  06/02/2023 7.1 6.5 - 8.1 g/dL Final  16/10/9602 7.2 6.4 - 8.2 g/dL Final   EGFR (African American)  Date Value Ref Range Status  09/22/2013 >60  Final   GFR calc Af Amer  Date Value Ref Range Status  08/18/2017 >60 >60 mL/min Final    Comment:    (NOTE) The eGFR has been calculated using the CKD EPI equation. This calculation has not been validated in all clinical situations. eGFR's persistently <60 mL/min signify possible Chronic Kidney Disease.    GFR  Date Value Ref Range Status  09/23/2017 91.12 >60.00 mL/min Final   eGFR  Date Value Ref Range Status  05/15/2021 104 > OR = 60 mL/min/1.71m2 Final    Comment:    The eGFR is based on the CKD-EPI 2021 equation. To calculate  the new eGFR from a previous Creatinine or Cystatin C result, go to https://www.kidney.org/professionals/ kdoqi/gfr%5Fcalculator    EGFR (Non-African Amer.)  Date Value Ref Range Status  09/22/2013 >60  Final    Comment:    eGFR values <10mL/min/1.73 m2 may be an indication of chronic kidney disease (CKD). Calculated eGFR is useful in patients with stable renal function. The eGFR calculation will not be reliable in acutely ill patients when serum creatinine is changing rapidly. It is not useful in  patients on dialysis. The eGFR calculation may not be applicable to patients at the low and high extremes of body sizes, pregnant women, and vegetarians.    GFR, Estimated  Date Value Ref Range Status  06/02/2023 >60 >60 mL/min Final    Comment:    (NOTE) Calculated using the CKD-EPI Creatinine Equation (2021) Performed at [facility], [address]  [place], [place] [address]          Passed - TSH in normal range and within 360 days    TSH  Date Value Ref Range Status  06/02/2023 2.888 0.350 - 4.500 uIU/mL Final    Comment:    Performed by a 3rd Generation assay with a functional sensitivity of <=0.01 uIU/mL. Performed at [facility], [address] [place], [place] [address]          Passed - Completed PHQ-2 or PHQ-9 in the last 360 days      Passed - Last BP in normal range    BP Readings from Last 1 Encounters:  06/04/23 108/66  Passed - Last Heart Rate in normal range    Pulse Readings from Last 1 Encounters:  06/04/23 85         Passed - Valid encounter within last 6 months    Recent Outpatient Visits           3 months ago Acute conjunctivitis of both eyes, unspecified acute conjunctivitis type   [place] [facility] [place], Kayleen Party, DO              Passed - CBC within normal limits and completed in the last 12 months    WBC  Date Value Ref Range Status  06/02/2023 9.9 4.0 - 10.5 K/uL Final   RBC  Date Value Ref Range Status  06/02/2023 4.63 3.87 - 5.11 MIL/uL Final   Hemoglobin  Date Value Ref Range Status  06/02/2023 13.3 12.0 - 15.0 g/dL Final   HGB  Date Value Ref Range Status  09/22/2013 14.6 12.0 - 16.0 g/dL Final   HCT  Date Value Ref Range Status  06/02/2023 41.5 36.0 - 46.0 % Final  09/22/2013 44.3 35.0 - 47.0 % Final   MCHC  Date Value Ref Range Status  06/02/2023 32.0 30.0 - 36.0 g/dL Final   [facility]  Date Value Ref Range Status  06/02/2023 28.7 26.0 - 34.0 pg Final   MCV  Date Value Ref Range Status  06/02/2023 89.6 80.0 - 100.0 fL Final  09/22/2013 90 80 - 100 fL Final   No results found for: "PLTCOUNTKUC", "LABPLAT", "POCPLA" RDW  Date Value Ref Range Status  06/02/2023 13.4 11.5 - 15.5 % Final  09/22/2013 13.8 11.5 - 14.5 % Final

## 2023-06-15 NOTE — Telephone Encounter (Signed)
 Requested medication (s) are due for refill today- no  Requested medication (s) are on the active medication list -yes  Future visit scheduled -no  Last refill: 10/08/22 #100 2 RF   Notes to clinic: non delegated Rx - provider review  Requested Prescriptions  Pending Prescriptions Disp Refills   QUEtiapine  (SEROQUEL ) 100 MG tablet [Pharmacy Med Name: QUEtiapine  Fumarate 100 MG Oral Tablet] 100 tablet 2    Sig: TAKE 1 TABLET BY MOUTH DAILY AT  9 PM AT BEDTIME     Not Delegated - Psychiatry:  Antipsychotics - Second Generation (Atypical) - quetiapine  Failed - 06/15/2023  3:46 PM      Failed - This refill cannot be delegated      Failed - Lipid Panel in normal range within the last 12 months    Cholesterol  Date Value Ref Range Status  06/03/2023 137 0 - 200 mg/dL Final  29/56/2130 865 (H) 0 - 200 mg/dL Final   Ldl Cholesterol, Calc  Date Value Ref Range Status  10/07/2012 127 (H) 0 - 100 mg/dL Final   LDL Cholesterol  Date Value Ref Range Status  06/03/2023 71 0 - 99 mg/dL Final    Comment:           Total Cholesterol/HDL:CHD Risk Coronary Heart Disease Risk Table                     Men   Women  1/2 Average Risk   3.4   3.3  Average Risk       5.0   4.4  2 X Average Risk   9.6   7.1  3 X Average Risk  23.4   11.0        Use the calculated Patient Ratio above and the CHD Risk Table to determine the patient's CHD Risk.        ATP III CLASSIFICATION (LDL):  <100     mg/dL   Optimal  784-696  mg/dL   Near or Above                    Optimal  130-159  mg/dL   Borderline  295-284  mg/dL   High  >132     mg/dL   Very High Performed at [facility], [address] [place], [place] [address]    HDL Cholesterol  Date Value Ref Range Status  10/07/2012 25 (L) 40 - 60 mg/dL Final   HDL  Date Value Ref Range Status  06/03/2023 31 (L) >40 mg/dL Final   Triglycerides  Date Value Ref Range Status  06/03/2023 177 (H) <150 mg/dL Final  27/25/3664 403 (H) 0 -  200 mg/dL Final         Failed - CMP within normal limits and completed in the last 12 months    Albumin  Date Value Ref Range Status  06/02/2023 3.5 3.5 - 5.0 g/dL Final  47/42/5956 2.6 (L) 3.4 - 5.0 g/dL Final   Alkaline Phosphatase  Date Value Ref Range Status  06/02/2023 52 38 - 126 U/L Final  09/22/2013 78 Unit/L Final    Comment:    46-116 NOTE: New Reference Range 08/02/13    ALT  Date Value Ref Range Status  06/02/2023 8 0 - 44 U/L Final   SGPT (ALT)  Date Value Ref Range Status  09/22/2013 16 U/L Final    Comment:    14-63 NOTE: New Reference Range 08/02/13    AST  Date Value Ref Range Status  06/02/2023 12 (L) 15 - 41 U/L Final   SGOT(AST)  Date Value Ref Range Status  09/22/2013 17 15 - 37 Unit/L Final   BUN  Date Value Ref Range Status  06/02/2023 17 6 - 20 mg/dL Final  78/29/5621 11 7 - 18 mg/dL Final   Calcium   Date Value Ref Range Status  06/02/2023 9.0 8.9 - 10.3 mg/dL Final   Calcium , Total  Date Value Ref Range Status  09/22/2013 8.3 (L) 8.5 - 10.1 mg/dL Final   Calcium , Ion  Date Value Ref Range Status  04/07/2017 1.17 1.15 - 1.40 mmol/L Final   CO2  Date Value Ref Range Status  06/02/2023 23 22 - 32 mmol/L Final   Co2  Date Value Ref Range Status  09/22/2013 27 21 - 32 mmol/L Final   Bicarbonate  Date Value Ref Range Status  12/08/2021 24.6 20.0 - 28.0 mmol/L Final   TCO2  Date Value Ref Range Status  04/07/2017 29 22 - 32 mmol/L Final   Creat  Date Value Ref Range Status  05/15/2021 0.62 0.50 - 1.03 mg/dL Final   Creatinine, Ser  Date Value Ref Range Status  06/02/2023 0.72 0.44 - 1.00 mg/dL Final   Glucose  Date Value Ref Range Status  09/22/2013 335 (H) 65 - 99 mg/dL Final   Glucose, Bld  Date Value Ref Range Status  06/02/2023 100 (H) 70 - 99 mg/dL Final    Comment:    Glucose reference range applies only to samples taken after fasting for at least 8 hours.   Glucose-Capillary  Date Value Ref Range  Status  06/02/2023 115 (H) 70 - 99 mg/dL Final    Comment:    Glucose reference range applies only to samples taken after fasting for at least 8 hours.   Potassium  Date Value Ref Range Status  06/02/2023 3.5 3.5 - 5.1 mmol/L Final  09/22/2013 3.8 3.5 - 5.1 mmol/L Final   Sodium  Date Value Ref Range Status  06/02/2023 136 135 - 145 mmol/L Final  09/22/2013 135 (L) 136 - 145 mmol/L Final   Total Bilirubin  Date Value Ref Range Status  06/02/2023 0.4 0.0 - 1.2 mg/dL Final   Bilirubin,Total  Date Value Ref Range Status  09/22/2013 0.2 0.2 - 1.0 mg/dL Final   Bilirubin, Direct  Date Value Ref Range Status  08/28/2012 < 0.1 0.00 - 0.20 mg/dL Final   Protein, ur  Date Value Ref Range Status  12/08/2021 30 (A) NEGATIVE mg/dL Final   Protein,UA  Date Value Ref Range Status  09/08/2022 Negative Negative/Trace Final   Total Protein  Date Value Ref Range Status  06/02/2023 7.1 6.5 - 8.1 g/dL Final  30/86/5784 7.2 6.4 - 8.2 g/dL Final   EGFR (African American)  Date Value Ref Range Status  09/22/2013 >60  Final   GFR calc Af Amer  Date Value Ref Range Status  08/18/2017 >60 >60 mL/min Final    Comment:    (NOTE) The eGFR has been calculated using the CKD EPI equation. This calculation has not been validated in all clinical situations. eGFR's persistently <60 mL/min signify possible Chronic Kidney Disease.    GFR  Date Value Ref Range Status  09/23/2017 91.12 >60.00 mL/min Final   eGFR  Date Value Ref Range Status  05/15/2021 104 > OR = 60 mL/min/1.94m2 Final    Comment:    The eGFR is based on the CKD-EPI 2021 equation. To calculate  the  new eGFR from a previous Creatinine or Cystatin C result, go to https://www.kidney.org/professionals/ kdoqi/gfr%5Fcalculator    EGFR (Non-African Amer.)  Date Value Ref Range Status  09/22/2013 >60  Final    Comment:    eGFR values <83mL/min/1.73 m2 may be an indication of chronic kidney disease (CKD). Calculated eGFR  is useful in patients with stable renal function. The eGFR calculation will not be reliable in acutely ill patients when serum creatinine is changing rapidly. It is not useful in  patients on dialysis. The eGFR calculation may not be applicable to patients at the low and high extremes of body sizes, pregnant women, and vegetarians.    GFR, Estimated  Date Value Ref Range Status  06/02/2023 >60 >60 mL/min Final    Comment:    (NOTE) Calculated using the CKD-EPI Creatinine Equation (2021) Performed at [facility], [address] [place], [place] [address]          Passed - TSH in normal range and within 360 days    TSH  Date Value Ref Range Status  06/02/2023 2.888 0.350 - 4.500 uIU/mL Final    Comment:    Performed by a 3rd Generation assay with a functional sensitivity of <=0.01 uIU/mL. Performed at [facility], [address] [place], [place] [address]          Passed - Completed PHQ-2 or PHQ-9 in the last 360 days      Passed - Last BP in normal range    BP Readings from Last 1 Encounters:  06/04/23 108/66         Passed - Last Heart Rate in normal range    Pulse Readings from Last 1 Encounters:  06/04/23 85         Passed - Valid encounter within last 6 months    Recent Outpatient Visits           3 months ago Acute conjunctivitis of both eyes, unspecified acute conjunctivitis type   [place] [facility] [place], Kayleen Party, DO              Passed - CBC within normal limits and completed in the last 12 months    WBC  Date Value Ref Range Status  06/02/2023 9.9 4.0 - 10.5 K/uL Final   RBC  Date Value Ref Range Status  06/02/2023 4.63 3.87 - 5.11 MIL/uL Final   Hemoglobin  Date Value Ref Range Status  06/02/2023 13.3 12.0 - 15.0 g/dL Final   HGB  Date Value Ref Range Status  09/22/2013 14.6 12.0 - 16.0 g/dL Final   HCT  Date Value Ref Range Status  06/02/2023 41.5 36.0 - 46.0 % Final  09/22/2013  44.3 35.0 - 47.0 % Final   MCHC  Date Value Ref Range Status  06/02/2023 32.0 30.0 - 36.0 g/dL Final   [facility]  Date Value Ref Range Status  06/02/2023 28.7 26.0 - 34.0 pg Final   MCV  Date Value Ref Range Status  06/02/2023 89.6 80.0 - 100.0 fL Final  09/22/2013 90 80 - 100 fL Final   No results found for: "PLTCOUNTKUC", "LABPLAT", "POCPLA" RDW  Date Value Ref Range Status  06/02/2023 13.4 11.5 - 15.5 % Final  09/22/2013 13.8 11.5 - 14.5 % Final         Refused Prescriptions Disp Refills   topiramate  (TOPAMAX ) 100 MG tablet [Pharmacy Med Name: Topiramate  100 MG Oral Tablet] 200 tablet 2    Sig: TAKE 1 TABLET  BY MOUTH TWICE  DAILY     Neurology: Anticonvulsants - topiramate  & zonisamide Failed - 06/15/2023  3:46 PM      Failed - AST in normal range and within 360 days    AST  Date Value Ref Range Status  06/02/2023 12 (L) 15 - 41 U/L Final   SGOT(AST)  Date Value Ref Range Status  09/22/2013 17 15 - 37 Unit/L Final         Passed - Cr in normal range and within 360 days    Creat  Date Value Ref Range Status  05/15/2021 0.62 0.50 - 1.03 mg/dL Final   Creatinine, Ser  Date Value Ref Range Status  06/02/2023 0.72 0.44 - 1.00 mg/dL Final         Passed - CO2 in normal range and within 360 days    CO2  Date Value Ref Range Status  06/02/2023 23 22 - 32 mmol/L Final   Co2  Date Value Ref Range Status  09/22/2013 27 21 - 32 mmol/L Final   Bicarbonate  Date Value Ref Range Status  12/08/2021 24.6 20.0 - 28.0 mmol/L Final         Passed - ALT in normal range and within 360 days    ALT  Date Value Ref Range Status  06/02/2023 8 0 - 44 U/L Final   SGPT (ALT)  Date Value Ref Range Status  09/22/2013 16 U/L Final    Comment:    14-63 NOTE: New Reference Range 08/02/13          Passed - Completed PHQ-2 or PHQ-9 in the last 360 days      Passed - Valid encounter within last 12 months    Recent Outpatient Visits           3 months ago Acute  conjunctivitis of both eyes, unspecified acute conjunctivitis type   [place] [facility] Raina Bunting, DO                 Requested Prescriptions  Pending Prescriptions Disp Refills   QUEtiapine  (SEROQUEL ) 100 MG tablet [Pharmacy Med Name: QUEtiapine  Fumarate 100 MG Oral Tablet] 100 tablet 2    Sig: TAKE 1 TABLET BY MOUTH DAILY AT  9 PM AT BEDTIME     Not Delegated - Psychiatry:  Antipsychotics - Second Generation (Atypical) - quetiapine  Failed - 06/15/2023  3:46 PM      Failed - This refill cannot be delegated      Failed - Lipid Panel in normal range within the last 12 months    Cholesterol  Date Value Ref Range Status  06/03/2023 137 0 - 200 mg/dL Final  30/86/5784 696 (H) 0 - 200 mg/dL Final   Ldl Cholesterol, Calc  Date Value Ref Range Status  10/07/2012 127 (H) 0 - 100 mg/dL Final   LDL Cholesterol  Date Value Ref Range Status  06/03/2023 71 0 - 99 mg/dL Final    Comment:           Total Cholesterol/HDL:CHD Risk Coronary Heart Disease Risk Table                     Men   Women  1/2 Average Risk   3.4   3.3  Average Risk       5.0   4.4  2 X Average Risk   9.6   7.1  3 X Average Risk  23.4   11.0  Use the calculated Patient Ratio above and the CHD Risk Table to determine the patient's CHD Risk.        ATP III CLASSIFICATION (LDL):  <100     mg/dL   Optimal  086-578  mg/dL   Near or Above                    Optimal  130-159  mg/dL   Borderline  469-629  mg/dL   High  >528     mg/dL   Very High Performed at [facility], [address] [place], [place] [address]    HDL Cholesterol  Date Value Ref Range Status  10/07/2012 25 (L) 40 - 60 mg/dL Final   HDL  Date Value Ref Range Status  06/03/2023 31 (L) >40 mg/dL Final   Triglycerides  Date Value Ref Range Status  06/03/2023 177 (H) <150 mg/dL Final  40/10/2723 366 (H) 0 - 200 mg/dL Final         Failed - CMP within normal limits and completed in  the last 12 months    Albumin  Date Value Ref Range Status  06/02/2023 3.5 3.5 - 5.0 g/dL Final  44/03/4740 2.6 (L) 3.4 - 5.0 g/dL Final   Alkaline Phosphatase  Date Value Ref Range Status  06/02/2023 52 38 - 126 U/L Final  09/22/2013 78 Unit/L Final    Comment:    46-116 NOTE: New Reference Range 08/02/13    ALT  Date Value Ref Range Status  06/02/2023 8 0 - 44 U/L Final   SGPT (ALT)  Date Value Ref Range Status  09/22/2013 16 U/L Final    Comment:    14-63 NOTE: New Reference Range 08/02/13    AST  Date Value Ref Range Status  06/02/2023 12 (L) 15 - 41 U/L Final   SGOT(AST)  Date Value Ref Range Status  09/22/2013 17 15 - 37 Unit/L Final   BUN  Date Value Ref Range Status  06/02/2023 17 6 - 20 mg/dL Final  59/56/3875 11 7 - 18 mg/dL Final   Calcium   Date Value Ref Range Status  06/02/2023 9.0 8.9 - 10.3 mg/dL Final   Calcium , Total  Date Value Ref Range Status  09/22/2013 8.3 (L) 8.5 - 10.1 mg/dL Final   Calcium , Ion  Date Value Ref Range Status  04/07/2017 1.17 1.15 - 1.40 mmol/L Final   CO2  Date Value Ref Range Status  06/02/2023 23 22 - 32 mmol/L Final   Co2  Date Value Ref Range Status  09/22/2013 27 21 - 32 mmol/L Final   Bicarbonate  Date Value Ref Range Status  12/08/2021 24.6 20.0 - 28.0 mmol/L Final   TCO2  Date Value Ref Range Status  04/07/2017 29 22 - 32 mmol/L Final   Creat  Date Value Ref Range Status  05/15/2021 0.62 0.50 - 1.03 mg/dL Final   Creatinine, Ser  Date Value Ref Range Status  06/02/2023 0.72 0.44 - 1.00 mg/dL Final   Glucose  Date Value Ref Range Status  09/22/2013 335 (H) 65 - 99 mg/dL Final   Glucose, Bld  Date Value Ref Range Status  06/02/2023 100 (H) 70 - 99 mg/dL Final    Comment:    Glucose reference range applies only to samples taken after fasting for at least 8 hours.   Glucose-Capillary  Date Value Ref Range Status  06/02/2023 115 (H) 70 - 99 mg/dL Final    Comment:    Glucose  reference range applies only to samples taken after fasting for at least 8 hours.   Potassium  Date Value Ref Range Status  06/02/2023 3.5 3.5 - 5.1 mmol/L Final  09/22/2013 3.8 3.5 - 5.1 mmol/L Final   Sodium  Date Value Ref Range Status  06/02/2023 136 135 - 145 mmol/L Final  09/22/2013 135 (L) 136 - 145 mmol/L Final   Total Bilirubin  Date Value Ref Range Status  06/02/2023 0.4 0.0 - 1.2 mg/dL Final   Bilirubin,Total  Date Value Ref Range Status  09/22/2013 0.2 0.2 - 1.0 mg/dL Final   Bilirubin, Direct  Date Value Ref Range Status  08/28/2012 < 0.1 0.00 - 0.20 mg/dL Final   Protein, ur  Date Value Ref Range Status  12/08/2021 30 (A) NEGATIVE mg/dL Final   Protein,UA  Date Value Ref Range Status  09/08/2022 Negative Negative/Trace Final   Total Protein  Date Value Ref Range Status  06/02/2023 7.1 6.5 - 8.1 g/dL Final  29/52/8413 7.2 6.4 - 8.2 g/dL Final   EGFR (African American)  Date Value Ref Range Status  09/22/2013 >60  Final   GFR calc Af Amer  Date Value Ref Range Status  08/18/2017 >60 >60 mL/min Final    Comment:    (NOTE) The eGFR has been calculated using the CKD EPI equation. This calculation has not been validated in all clinical situations. eGFR's persistently <60 mL/min signify possible Chronic Kidney Disease.    GFR  Date Value Ref Range Status  09/23/2017 91.12 >60.00 mL/min Final   eGFR  Date Value Ref Range Status  05/15/2021 104 > OR = 60 mL/min/1.23m2 Final    Comment:    The eGFR is based on the CKD-EPI 2021 equation. To calculate  the new eGFR from a previous Creatinine or Cystatin C result, go to https://www.kidney.org/professionals/ kdoqi/gfr%5Fcalculator    EGFR (Non-African Amer.)  Date Value Ref Range Status  09/22/2013 >60  Final    Comment:    eGFR values <36mL/min/1.73 m2 may be an indication of chronic kidney disease (CKD). Calculated eGFR is useful in patients with stable renal function. The eGFR calculation  will not be reliable in acutely ill patients when serum creatinine is changing rapidly. It is not useful in  patients on dialysis. The eGFR calculation may not be applicable to patients at the low and high extremes of body sizes, pregnant women, and vegetarians.    GFR, Estimated  Date Value Ref Range Status  06/02/2023 >60 >60 mL/min Final    Comment:    (NOTE) Calculated using the CKD-EPI Creatinine Equation (2021) Performed at [facility], [address] [place], [place] [address]          Passed - TSH in normal range and within 360 days    TSH  Date Value Ref Range Status  06/02/2023 2.888 0.350 - 4.500 uIU/mL Final    Comment:    Performed by a 3rd Generation assay with a functional sensitivity of <=0.01 uIU/mL. Performed at [facility], [address] [place], [place] [address]          Passed - Completed PHQ-2 or PHQ-9 in the last 360 days      Passed - Last BP in normal range    BP Readings from Last 1 Encounters:  06/04/23 108/66         Passed - Last Heart Rate in normal range    Pulse Readings from Last 1 Encounters:  06/04/23 85  Passed - Valid encounter within last 6 months    Recent Outpatient Visits           3 months ago Acute conjunctivitis of both eyes, unspecified acute conjunctivitis type   [place] [facility] [place], Kayleen Party, DO              Passed - CBC within normal limits and completed in the last 12 months    WBC  Date Value Ref Range Status  06/02/2023 9.9 4.0 - 10.5 K/uL Final   RBC  Date Value Ref Range Status  06/02/2023 4.63 3.87 - 5.11 MIL/uL Final   Hemoglobin  Date Value Ref Range Status  06/02/2023 13.3 12.0 - 15.0 g/dL Final   HGB  Date Value Ref Range Status  09/22/2013 14.6 12.0 - 16.0 g/dL Final   HCT  Date Value Ref Range Status  06/02/2023 41.5 36.0 - 46.0 % Final  09/22/2013 44.3 35.0 - 47.0 % Final   MCHC  Date Value Ref Range Status   06/02/2023 32.0 30.0 - 36.0 g/dL Final   [facility]  Date Value Ref Range Status  06/02/2023 28.7 26.0 - 34.0 pg Final   MCV  Date Value Ref Range Status  06/02/2023 89.6 80.0 - 100.0 fL Final  09/22/2013 90 80 - 100 fL Final   No results found for: "PLTCOUNTKUC", "LABPLAT", "POCPLA" RDW  Date Value Ref Range Status  06/02/2023 13.4 11.5 - 15.5 % Final  09/22/2013 13.8 11.5 - 14.5 % Final         Refused Prescriptions Disp Refills   topiramate  (TOPAMAX ) 100 MG tablet [Pharmacy Med Name: Topiramate  100 MG Oral Tablet] 200 tablet 2    Sig: TAKE 1 TABLET BY MOUTH TWICE  DAILY     Neurology: Anticonvulsants - topiramate  & zonisamide Failed - 06/15/2023  3:46 PM      Failed - AST in normal range and within 360 days    AST  Date Value Ref Range Status  06/02/2023 12 (L) 15 - 41 U/L Final   SGOT(AST)  Date Value Ref Range Status  09/22/2013 17 15 - 37 Unit/L Final         Passed - Cr in normal range and within 360 days    Creat  Date Value Ref Range Status  05/15/2021 0.62 0.50 - 1.03 mg/dL Final   Creatinine, Ser  Date Value Ref Range Status  06/02/2023 0.72 0.44 - 1.00 mg/dL Final         Passed - CO2 in normal range and within 360 days    CO2  Date Value Ref Range Status  06/02/2023 23 22 - 32 mmol/L Final   Co2  Date Value Ref Range Status  09/22/2013 27 21 - 32 mmol/L Final   Bicarbonate  Date Value Ref Range Status  12/08/2021 24.6 20.0 - 28.0 mmol/L Final         Passed - ALT in normal range and within 360 days    ALT  Date Value Ref Range Status  06/02/2023 8 0 - 44 U/L Final   SGPT (ALT)  Date Value Ref Range Status  09/22/2013 16 U/L Final    Comment:    14-63 NOTE: New Reference Range 08/02/13          Passed - Completed PHQ-2 or PHQ-9 in the last 360 days      Passed - Valid encounter within last 12 months    Recent Outpatient Visits  3 months ago Acute conjunctivitis of both eyes, unspecified acute conjunctivitis type   [facility], Kayleen Party, [place]

## 2023-06-17 NOTE — Telephone Encounter (Signed)
 okay

## 2023-06-18 ENCOUNTER — Ambulatory Visit: Payer: Self-pay

## 2023-06-18 ENCOUNTER — Telehealth (INDEPENDENT_AMBULATORY_CARE_PROVIDER_SITE_OTHER): Payer: Self-pay | Admitting: Nurse Practitioner

## 2023-06-18 NOTE — Telephone Encounter (Signed)
 Yes HFU next week. She has upcoming with Vascular and Cardiology as well.   Domingo Friend, DO [facility] 06/18/2023, 2:11 PM

## 2023-06-18 NOTE — Telephone Encounter (Signed)
 Pt LVM stating she needed an appt. I ATC pt to schedule her last missed appt from 5.22.25 (she was in hospital). VM was left for pt to call back to schedule. When pt calls back, please R/S appt from 5.22.25 with Sharla Davis, NP.

## 2023-06-18 NOTE — Telephone Encounter (Signed)
 Copied from CRM [id number]. Topic: Clinical - Red Word Triage >> Jun 18, 2023  1:52 PM Fonda T wrote: Red Word that prompted transfer to Nurse Triage: stroke recovering one week ago, confusion Answer Assessment - Initial Assessment Questions 1. REASON FOR CALL or QUESTION: "What is your reason for calling today?" or "How can I best help you?" or "What question do you have that I can help answer?"        Patient had a CVA 1 week ago.  Pt reports she was admitted, left AMA.   Patient wanted to do a follow-up post admit. Denies new acute symptoms.  Protocols used: Information Only Call - No Triage-A-AH

## 2023-06-23 ENCOUNTER — Ambulatory Visit (INDEPENDENT_AMBULATORY_CARE_PROVIDER_SITE_OTHER): Admitting: Family Medicine

## 2023-06-23 ENCOUNTER — Encounter: Payer: Self-pay | Admitting: Family Medicine

## 2023-06-23 VITALS — BP 124/80 | HR 106 | Ht 66.0 in

## 2023-06-23 DIAGNOSIS — I639 Cerebral infarction, unspecified: Secondary | ICD-10-CM

## 2023-06-23 DIAGNOSIS — I6930 Unspecified sequelae of cerebral infarction: Secondary | ICD-10-CM | POA: Diagnosis not present

## 2023-06-23 DIAGNOSIS — G43909 Migraine, unspecified, not intractable, without status migrainosus: Secondary | ICD-10-CM

## 2023-06-23 DIAGNOSIS — I69359 Hemiplegia and hemiparesis following cerebral infarction affecting unspecified side: Secondary | ICD-10-CM | POA: Diagnosis not present

## 2023-06-23 MED ORDER — RIZATRIPTAN BENZOATE 10 MG PO TBDP: 10.0000 mg | ORAL_TABLET | ORAL | 2 refills | Status: AC | PRN

## 2023-06-23 NOTE — Progress Notes (Unsigned)
 Subjective:    Patient ID: [name], female    DOB: 24-Aug-1965, 58 y.o.   MRN: [id number]  [name] is a 58 y.o. female presenting on 06/23/2023 for No chief complaint on file.   HPI  HOSPITAL FOLLOW-UP VISIT  Hospital/Location: ***[facility] Date of Admission: *** Date of Discharge: *** Transitions of care telephone call: ***  Reason for Admission: *** Primary (+Secondary) Diagnosis: *** (***, ***)  - Hospital H&P and Discharge Summary have been reviewed - Patient presents today *** days *** after recent hospitalization. Brief summary of recent course, patient had symptoms of *** for ***, hospitalized, *** treated with ***.  - Today reports overall has ***done well after discharge. Symptoms of *** ***have resolved ***are much improved ***seem to be worsening again. - New medications on discharge: *** - Changes to current meds on discharge: ***  ***Headache for 3 days, tried aspirin , Tylenol , Advil AS NEEDED   ***Can read short sentences  ***[facility] OT RN LCSW  I have reviewed the discharge medication list, and have reconciled the current and discharge medications today.   Current Outpatient Medications:    albuterol  (VENTOLIN  HFA) 108 (90 Base) MCG/ACT inhaler, INHALE 2 INHALATIONS BY MOUTH  EVERY 4 HOURS AS NEEDED FOR  WHEEZING OR FOR SHORTNESS OF  BREATH, Disp: 51 g, Rfl: 2   aspirin  EC 81 MG tablet, Take 1 tablet (81 mg total) by mouth daily. Swallow whole., Disp: 90 tablet, Rfl: 0   atorvastatin  (LIPITOR) 40 MG tablet, TAKE 1 TABLET BY MOUTH AT  BEDTIME, Disp: 90 tablet, Rfl: 3   buPROPion  (WELLBUTRIN  XL) 150 MG 24 hr tablet, TAKE 1 TABLET BY MOUTH DAILY, Disp: 100 tablet, Rfl: 1   citalopram  (CELEXA ) 40 MG tablet, TAKE 1 TABLET BY MOUTH DAILY, Disp: 100 tablet, Rfl: 1   clopidogrel  (PLAVIX ) 75 MG tablet, TAKE 1 TABLET BY MOUTH DAILY AT  9AM, Disp: 100 tablet, Rfl: 1   fluticasone  (FLONASE ) 50 MCG/ACT nasal spray, Place 2 sprays into both nostrils daily.  Use for 4-6 weeks then stop and use seasonally or as needed., Disp: 16 g, Rfl: 0   gabapentin  (NEURONTIN ) 600 MG tablet, TAKE 1 TABLET BY MOUTH 4 TIMES  DAILY, Disp: 400 tablet, Rfl: 2   leptospermum manuka honey (MEDIHONEY) gel, Apply 1 Application topically daily., Disp: 44 mL, Rfl: 0   MOUNJARO  10 MG/0.5ML Pen, INJECT SUBCUTANEOUSLY 10 MG ONCE WEEKLY, Disp: 6 mL, Rfl: 3   mupirocin  ointment (BACTROBAN ) 2 %, Apply 1 Application topically 2 (two) times daily. For 10 days, Disp: 22 g, Rfl: 0   nicotine  (NICODERM CQ  - DOSED IN MG/24 HOURS) 14 mg/24hr patch, Place onto the skin., Disp: , Rfl:    nystatin  (MYCOSTATIN /NYSTOP ) powder, APPLY 1 APPLICATION TOPICALLY THREE TIMES DAILY AS NEEDED FOR YEAST RASH INTERTRIGO, Disp: 30 g, Rfl: 11   ondansetron  (ZOFRAN -ODT) 4 MG disintegrating tablet, DISSOLVE 1 TABLET ON THE TONGUE EVERY 8 HOURS AS NEEDED FOR NAUSEA AND VOMITING, Disp: 30 tablet, Rfl: 2   oxyCODONE -acetaminophen  (PERCOCET) 10-325 MG tablet, Take 1 tablet by mouth every 6 (six) hours as needed for pain., Disp: , Rfl:    pantoprazole  (PROTONIX ) 40 MG tablet, TAKE 1 TABLET BY MOUTH DAILY AT  9 AM, Disp: 100 tablet, Rfl: 0   QUEtiapine  (SEROQUEL ) 100 MG tablet, TAKE 1 TABLET BY MOUTH DAILY AT  9 PM AT BEDTIME, Disp: 100 tablet, Rfl: 0   rizatriptan (MAXALT-MLT) 10 MG disintegrating tablet, Take 1 tablet (10 mg  total) by mouth as needed. May repeat in 2 hours if needed, Disp: 10 tablet, Rfl: 2   topiramate  (TOPAMAX ) 100 MG tablet, TAKE 1 TABLET BY MOUTH TWICE  DAILY, Disp: 200 tablet, Rfl: 2   TRELEGY ELLIPTA  100-62.5-25 MCG/ACT AEPB, USE 1 INHALATION BY MOUTH ONCE  DAILY AT THE SAME TIME EACH DAY, Disp: 180 each, Rfl: 3   Vitamin D , Ergocalciferol , (DRISDOL ) 1.25 MG (50000 UNIT) CAPS capsule, TAKE 1 CAPSULE BY MOUTH EVERY WEDNESDAY @9AM , Disp: 4 capsule, Rfl: 11   azelastine  (ASTELIN ) 0.1 % nasal spray, Place 2 sprays into both nostrils 2 (two) times daily. (Patient not taking: Reported on 06/02/2023),  Disp: 30 mL, Rfl: 12   cyclobenzaprine  (FLEXERIL ) 10 MG tablet, Take 10 mg by mouth 3 (three) times daily. (Patient not taking: Reported on 06/02/2023), Disp: , Rfl:    methocarbamol  (ROBAXIN ) 500 MG tablet, Take 500 mg by mouth 3 (three) times daily. (Patient not taking: Reported on 06/23/2023), Disp: , Rfl:    midodrine  (PROAMATINE ) 5 MG tablet, Take 1 tablet (5 mg total) by mouth 3 (three) times daily with meals. (Patient not taking: Reported on 06/23/2023), Disp: 21 tablet, Rfl: 0   trimethoprim -polymyxin b  (POLYTRIM ) ophthalmic solution, Place 1 drop into both eyes 4 (four) times daily. For 7-10 days (Patient not taking: Reported on 06/23/2023), Disp: 10 mL, Rfl: 0  ------------------------------------------------------------------------- Social History   Tobacco Use   Smoking status: Every Day    Current packs/day: 1.00    Average packs/day: 1 pack/day for 20.0 years (20.0 ttl pk-yrs)    Types: Cigarettes   Smokeless tobacco: Never  Vaping Use   Vaping status: Never Used  Substance Use Topics   Alcohol  use: No   Drug use: Yes    Types: Marijuana, Oxycodone     Comment: last smoked 2 days ago  8/4    Review of Systems Per HPI unless specifically indicated above     Objective:     BP 124/80 (BP Location: Right Arm, Patient Position: Sitting, Cuff Size: Normal)   Pulse (!) 106   Ht 5\' 6"  (1.676 m)   SpO2 97%   BMI 34.78 kg/m   Wt Readings from Last 3 Encounters:  06/02/23 215 lb 8 oz (97.8 kg)  05/13/23 215 lb (97.5 kg)  04/29/23 222 lb (100.7 kg)    Physical Exam*** Results for orders placed or performed during the hospital encounter of 06/02/23  Protime-INR   Collection Time: 06/02/23 12:55 PM  Result Value Ref Range   Prothrombin Time 13.6 11.4 - 15.2 seconds   INR 1.0 0.8 - 1.2  APTT   Collection Time: 06/02/23 12:55 PM  Result Value Ref Range   aPTT 29 24 - 36 seconds  CBC   Collection Time: 06/02/23 12:55 PM  Result Value Ref Range   WBC 10.3 4.0 - 10.5  K/uL   RBC 4.70 3.87 - 5.11 MIL/uL   Hemoglobin 13.5 12.0 - 15.0 g/dL   HCT 16.1 09.6 - 04.5 %   MCV 89.8 80.0 - 100.0 fL   MCH 28.7 26.0 - 34.0 pg   MCHC 32.0 30.0 - 36.0 g/dL   RDW 40.9 81.1 - 91.4 %   Platelets 329 150 - 400 K/uL   nRBC 0.0 0.0 - 0.2 %  Differential   Collection Time: 06/02/23 12:55 PM  Result Value Ref Range   Neutrophils Relative % 49 %   Neutro Abs 5.0 1.7 - 7.7 K/uL   Lymphocytes Relative 41 %  Lymphs Abs 4.3 (H) 0.7 - 4.0 K/uL   Monocytes Relative 7 %   Monocytes Absolute 0.8 0.1 - 1.0 K/uL   Eosinophils Relative 2 %   Eosinophils Absolute 0.2 0.0 - 0.5 K/uL   Basophils Relative 1 %   Basophils Absolute 0.1 0.0 - 0.1 K/uL   Immature Granulocytes 0 %   Abs Immature Granulocytes 0.03 0.00 - 0.07 K/uL  Comprehensive metabolic panel   Collection Time: 06/02/23 12:55 PM  Result Value Ref Range   Sodium 136 135 - 145 mmol/L   Potassium 3.5 3.5 - 5.1 mmol/L   Chloride 102 98 - 111 mmol/L   CO2 23 22 - 32 mmol/L   Glucose, Bld 100 (H) 70 - 99 mg/dL   BUN 17 6 - 20 mg/dL   Creatinine, Ser 8.29 0.44 - 1.00 mg/dL   Calcium  9.0 8.9 - 10.3 mg/dL   Total Protein 7.1 6.5 - 8.1 g/dL   Albumin 3.5 3.5 - 5.0 g/dL   AST 12 (L) 15 - 41 U/L   ALT 8 0 - 44 U/L   Alkaline Phosphatase 52 38 - 126 U/L   Total Bilirubin 0.4 0.0 - 1.2 mg/dL   GFR, Estimated >56 >21 mL/min   Anion gap 11 5 - 15  Ethanol   Collection Time: 06/02/23 12:55 PM  Result Value Ref Range   Alcohol , Ethyl (B) <15 <15 mg/dL  Hemoglobin H0Q   Collection Time: 06/02/23 12:55 PM  Result Value Ref Range   Hgb A1c MFr Bld 5.9 (H) 4.8 - 5.6 %   Mean Plasma Glucose 122.63 mg/dL  TSH   Collection Time: 06/02/23 12:55 PM  Result Value Ref Range   TSH 2.888 0.350 - 4.500 uIU/mL  CBG monitoring, ED   Collection Time: 06/02/23 12:55 PM  Result Value Ref Range   Glucose-Capillary 115 (H) 70 - 99 mg/dL   Comment 1 Notify RN    Comment 2 Document in Chart   CBC   Collection Time: 06/02/23  5:50  PM  Result Value Ref Range   WBC 9.9 4.0 - 10.5 K/uL   RBC 4.63 3.87 - 5.11 MIL/uL   Hemoglobin 13.3 12.0 - 15.0 g/dL   HCT 65.7 84.6 - 96.2 %   MCV 89.6 80.0 - 100.0 fL   MCH 28.7 26.0 - 34.0 pg   MCHC 32.0 30.0 - 36.0 g/dL   RDW 95.2 84.1 - 32.4 %   Platelets 316 150 - 400 K/uL   nRBC 0.0 0.0 - 0.2 %  Creatinine, serum   Collection Time: 06/02/23  5:50 PM  Result Value Ref Range   Creatinine, Ser 0.72 0.44 - 1.00 mg/dL   GFR, Estimated >40 >10 mL/min  Lipid panel   Collection Time: 06/03/23  4:26 AM  Result Value Ref Range   Cholesterol 137 0 - 200 mg/dL   Triglycerides 272 (H) <150 mg/dL   HDL 31 (L) >53 mg/dL   Total CHOL/HDL Ratio 4.4 RATIO   VLDL 35 0 - 40 mg/dL   LDL Cholesterol 71 0 - 99 mg/dL  ECHOCARDIOGRAM COMPLETE BUBBLE STUDY   Collection Time: 06/03/23  7:55 AM  Result Value Ref Range   Ao pk vel 1.17 m/s   AV Area VTI 2.26 cm2   AR max vel 2.29 cm2   AV Mean grad 3.0 mmHg   AV Peak grad 5.5 mmHg   S' Lateral 2.50 cm   AV Area mean vel 2.38 cm2   Area-P 1/2 4.26 cm2  Est EF 50 - 55%       Assessment & Plan:   Problem List Items Addressed This Visit     History of cerebrovascular accident (CVA) with residual deficit   Other Visit Diagnoses       Episodic migraine    -  Primary   Relevant Medications   rizatriptan (MAXALT-MLT) 10 MG disintegrating tablet     Cryptogenic stroke (HCC)            Meds ordered this encounter  Medications   rizatriptan (MAXALT-MLT) 10 MG disintegrating tablet    Sig: Take 1 tablet (10 mg total) by mouth as needed. May repeat in 2 hours if needed    Dispense:  10 tablet    Refill:  2    Follow up plan: Return if symptoms worsen or fail to improve.   Domingo Friend, DO [facility] 06/23/2023, 3:24 PM

## 2023-06-23 NOTE — Patient Instructions (Addendum)
 Thank you for coming to the office today.  Aspirin  + Plavix  for 3 months. Then stop the Aspirin  and continue Plavix .  Schedule with Dr Walden Guise for after hospital follow-up.  [facility] - Neurology Dept [address] [place], [place] [address] Phone: [phone]  The Neurologist. Recommended the Trans-esophageal ECHOcardiogram - TEE  - they may re-schedule something like this in the future.  Call Cardiology Dr Junnie Olives to schedule Hospital Follow-up for further Stroke evaluation.  [facility]) HeartCare at [facility] [address] [place], [place] [address] Main: [phone]   Call us  with the exact list / name of which tests the pain doctors need from [facility]  We need Complete Metabolic Panel (CMET, CMP) and CBC Complete Blood Count.  If there is anything else we need, I will contact you back and order through [facility] and print.  Rizatriptan 10mg  dissolving tab. May repeat in 2 hours if need.   Florette Hurry sample today we can try to order in future  We will refer to [facility] services PT, OT, RN, Home Aid  Please schedule a Follow-up Appointment to: Return if symptoms worsen or fail to improve.  If you have any other questions or concerns, please feel free to call the office or send a message through MyChart. You may also schedule an earlier appointment if necessary.  Additionally, you may be receiving a survey about your experience at our office within a few days to 1 week by e-mail or mail. We value your feedback.  Domingo Friend, DO [facility], [place]

## 2023-06-24 ENCOUNTER — Telehealth: Payer: Self-pay

## 2023-06-24 DIAGNOSIS — I69359 Hemiplegia and hemiparesis following cerebral infarction affecting unspecified side: Secondary | ICD-10-CM | POA: Insufficient documentation

## 2023-06-24 NOTE — Telephone Encounter (Signed)
 This covers the tests that I need. I only needed CMP + CBC for the purposes of the Hospital Follow Up apt.   No additional labs from me  If they can make copies of labs available to us  after results are in that would be great. Thank you!  Domingo Friend, DO [facility] 06/24/2023, 5:03 PM

## 2023-06-24 NOTE — Telephone Encounter (Signed)
 Copied from CRM [id number]. Topic: General - Other >> Jun 24, 2023  3:16 PM Sophia H wrote: Reason for CRM: Patients husband called in today to provide lab tests that were ordered for the patient by another provider. Stated if Dr. Linnell Richardson wants to add anything please let them know when those orders are put in. [phone]  Uric acid blood, CMP,ANA w reflux, CBC w Differential wbc, rheumatoid factor quant

## 2023-06-25 ENCOUNTER — Other Ambulatory Visit: Payer: Self-pay | Admitting: Family Medicine

## 2023-06-25 DIAGNOSIS — K219 Gastro-esophageal reflux disease without esophagitis: Secondary | ICD-10-CM

## 2023-06-25 NOTE — Telephone Encounter (Signed)
 Patient notified no additional labs need to be ordered, and she will make sure we get a copy.

## 2023-06-26 ENCOUNTER — Telehealth: Payer: Self-pay

## 2023-06-26 NOTE — Telephone Encounter (Signed)
 Copied from CRM [id number]. Topic: General - Other >> Jun 26, 2023  9:31 AM Turkey B wrote: Reason for CRM: Leighann, from [employer] states pt has other apts today, so she couldn't do her Start of care today with pt. They will start on Monday

## 2023-06-26 NOTE — Telephone Encounter (Signed)
 Noted

## 2023-06-29 ENCOUNTER — Other Ambulatory Visit: Payer: Self-pay

## 2023-06-29 ENCOUNTER — Emergency Department

## 2023-06-29 ENCOUNTER — Emergency Department
Admission: EM | Admit: 2023-06-29 | Discharge: 2023-06-29 | Disposition: A | Discharge status: Home / Self Care | Attending: Emergency Medicine | Admitting: Emergency Medicine

## 2023-06-29 DIAGNOSIS — R079 Chest pain, unspecified: Secondary | ICD-10-CM | POA: Diagnosis not present

## 2023-06-29 DIAGNOSIS — J449 Chronic obstructive pulmonary disease, unspecified: Secondary | ICD-10-CM | POA: Insufficient documentation

## 2023-06-29 DIAGNOSIS — E119 Type 2 diabetes mellitus without complications: Secondary | ICD-10-CM | POA: Diagnosis not present

## 2023-06-29 DIAGNOSIS — R0789 Other chest pain: Secondary | ICD-10-CM | POA: Diagnosis not present

## 2023-06-29 DIAGNOSIS — Z8673 Personal history of transient ischemic attack (TIA), and cerebral infarction without residual deficits: Secondary | ICD-10-CM | POA: Diagnosis not present

## 2023-06-29 DIAGNOSIS — I10 Essential (primary) hypertension: Secondary | ICD-10-CM | POA: Insufficient documentation

## 2023-06-29 LAB — COMPREHENSIVE METABOLIC PANEL WITH GFR
ALT: 9 U/L (ref 0–44)
AST: 13 U/L — ABNORMAL LOW (ref 15–41)
Albumin: 3.5 g/dL (ref 3.5–5.0)
Alkaline Phosphatase: 68 U/L (ref 38–126)
Anion gap: 11 (ref 5–15)
BUN: 12 mg/dL (ref 6–20)
CO2: 23 mmol/L (ref 22–32)
Calcium: 8.9 mg/dL (ref 8.9–10.3)
Chloride: 105 mmol/L (ref 98–111)
Creatinine, Ser: 0.7 mg/dL (ref 0.44–1.00)
GFR, Estimated: >60 mL/min (ref >60)
Glucose, Bld: 185 mg/dL — ABNORMAL HIGH (ref 70–99)
Potassium: 3.7 mmol/L (ref 3.5–5.1)
Sodium: 139 mmol/L (ref 135–145)
Total Bilirubin: 0.5 mg/dL (ref 0.0–1.2)
Total Protein: 7.3 g/dL (ref 6.5–8.1)

## 2023-06-29 LAB — CBC WITH DIFFERENTIAL/PLATELET
Abs Immature Granulocytes: 0.08 10*3/uL — ABNORMAL HIGH (ref 0.00–0.07)
Basophils Absolute: 0.1 10*3/uL (ref 0.0–0.1)
Basophils Relative: 0 %
Eosinophils Absolute: 0.1 10*3/uL (ref 0.0–0.5)
Eosinophils Relative: 1 %
HCT: 44.1 % (ref 36.0–46.0)
Hemoglobin: 14.3 g/dL (ref 12.0–15.0)
Immature Granulocytes: 1 %
Lymphocytes Relative: 22 %
Lymphs Abs: 3.2 10*3/uL (ref 0.7–4.0)
MCH: 28.5 pg (ref 26.0–34.0)
MCHC: 32.4 g/dL (ref 30.0–36.0)
MCV: 87.8 fL (ref 80.0–100.0)
Monocytes Absolute: 0.8 10*3/uL (ref 0.1–1.0)
Monocytes Relative: 5 %
Neutro Abs: 10.5 10*3/uL — ABNORMAL HIGH (ref 1.7–7.7)
Neutrophils Relative %: 71 %
Platelets: 387 10*3/uL (ref 150–400)
RBC: 5.02 MIL/uL (ref 3.87–5.11)
RDW: 13.3 % (ref 11.5–15.5)
WBC: 14.7 10*3/uL — ABNORMAL HIGH (ref 4.0–10.5)
nRBC: 0 % (ref 0.0–0.2)

## 2023-06-29 LAB — TROPONIN I (HIGH SENSITIVITY): Troponin I (High Sensitivity): 5 ng/L (ref <18)

## 2023-06-29 MED ORDER — ONDANSETRON HCL 4 MG/2ML IJ SOLN: 4.0000 mg | Freq: Once | INTRAMUSCULAR | Status: DC | PRN

## 2023-06-29 MED ORDER — CYCLOBENZAPRINE HCL 10 MG PO TABS: 10.0000 mg | ORAL_TABLET | Freq: Three times a day (TID) | ORAL | 0 refills | Status: AC | PRN

## 2023-06-29 MED ORDER — KETOROLAC TROMETHAMINE 30 MG/ML IJ SOLN
15.0000 mg | Freq: Once | INTRAMUSCULAR | Status: AC
Start: 2023-06-29 — End: 2023-06-29
  Administered 2023-06-29: 15 mg via INTRAVENOUS
  Filled 2023-06-29: qty 1

## 2023-06-29 MED ORDER — FENTANYL CITRATE PF 50 MCG/ML IJ SOSY
50.0000 ug | PREFILLED_SYRINGE | Freq: Once | INTRAMUSCULAR | Status: AC
  Administered 2023-06-29: 50 ug via INTRAVENOUS
  Filled 2023-06-29: qty 1

## 2023-06-29 NOTE — ED Notes (Signed)
 Previous RN called and notified transport to pt needing to go back home. Transport EMS arrived and pt refused their services. Pt sts  no one told me that I was going to go by EMS. I am going to wait until my husband come here. [place] RN express that if EMS transport was needed it could take awhile for them to come back. Pt sts  I get in the car with my husband all the when I go to doctor appointments RN released EMS at this time.

## 2023-06-29 NOTE — ED Notes (Signed)
 [facility] called to transport patient to her home spoke with Oralee Billow

## 2023-06-29 NOTE — ED Triage Notes (Signed)
 Pt to ED from home via [facility] for chest pain. Right sided chest pain started around 1800 on 6/15. CP is triggered by movement. PT denies any injury to right side.   Pt took 324 Asprin at home at at dispatches instructions.

## 2023-06-29 NOTE — ED Provider Notes (Signed)
 [facility] Provider Note   Event Date/Time   First MD Initiated Contact with Patient 06/29/23 [phone]     (approximate) History  Chest Pain  HPI [name] is a 58 y.o. female with a past medical history of CVA currently bedbound, type 2 diabetes, hypertension, COPD, and morbid obesity who presents via EMS complaining of right anterior lower chest wall pain over the anterior mid axillary line in the inferior rib cage.  Patient endorses worsening pain with deep breaths, movement, and palpation over this area.  Patient denies any pain similar to this in the past.  Patient denies any activity out of the ordinary as she is currently bedbound. ROS: Patient currently denies any vision changes, tinnitus, difficulty speaking, facial droop, sore throat, shortness of breath, abdominal pain, nausea/vomiting/diarrhea, dysuria, or weakness/numbness/paresthesias in any extremity   Physical Exam  Triage Vital Signs: ED Triage Vitals  Encounter Vitals Group     BP      Girls Systolic BP Percentile      Girls Diastolic BP Percentile      Boys Systolic BP Percentile      Boys Diastolic BP Percentile      Pulse      Resp      Temp      Temp src      SpO2      Weight      Height      Head Circumference      Peak Flow      Pain Score      Pain Loc      Pain Education      Exclude from Growth Chart    Most recent vital signs: Vitals:   06/29/23 0347  BP: 93/79  Pulse: 95  Resp: 14  Temp: 98.6 F (37 C)   General: Awake, oriented x4. CV:  Good peripheral perfusion. Resp:  Normal effort. Abd:  No distention. Other:  Middle-aged morbidly obese Caucasian female resting comfortably in no acute distress ED Results / Procedures / Treatments  Labs (all labs ordered are listed, but only abnormal results are displayed) Labs Reviewed  COMPREHENSIVE METABOLIC PANEL WITH GFR - Abnormal; Notable for the following components:      Result Value   Glucose, Bld 185 (*)     AST 13 (*)    All other components within normal limits  CBC WITH DIFFERENTIAL/PLATELET - Abnormal; Notable for the following components:   WBC 14.7 (*)    Neutro Abs 10.5 (*)    Abs Immature Granulocytes 0.08 (*)    All other components within normal limits  TROPONIN I (HIGH SENSITIVITY)   EKG ED ECG REPORT I, Charleen Conn, the attending physician, personally viewed and interpreted this ECG. Date: 06/29/2023 EKG Time: 0347 Rate: 95 Rhythm: normal sinus rhythm QRS Axis: normal Intervals: normal ST/T Wave abnormalities: normal Narrative Interpretation: no evidence of acute ischemia RADIOLOGY ED MD interpretation: One-view portable chest x-ray interpreted by me shows no evidence of acute abnormalities including no pneumonia, pneumothorax, or widened mediastinum - All radiology independently interpreted and agree with radiology assessment Official radiology report(s): DG Chest Port 1 View Result Date: 06/29/2023 EXAM: 1 VIEW XRAY OF THE CHEST 06/29/2023 03:52:00 AM COMPARISON: 12/08/2021 CLINICAL HISTORY: Right lower chest wall pain. Pt to ED from home via [facility] for chest pain. Right sided chest pain started around 1800 on 6/15. CP is triggered by movement. PT denies any injury to right side. Pt took 324 Asprin  at home at at dispatches instructions. FINDINGS: LUNGS AND PLEURA: No focal pulmonary opacity. No pulmonary edema. No pleural effusion. No pneumothorax. HEART AND MEDIASTINUM: No acute abnormality of the cardiac and mediastinal silhouettes. BONES AND SOFT TISSUES: Cervical spine fixation hardware. No acute osseous abnormality. IMPRESSION: 1. No acute process. Electronically signed by: Zadie Herter MD 06/29/2023 03:54 AM EDT RP Workstation: [id number]   PROCEDURES: Critical Care performed: No Procedures MEDICATIONS ORDERED IN ED: Medications  ondansetron  (ZOFRAN ) injection 4 mg (has no administration in time range)  ketorolac  (TORADOL ) 30 MG/ML injection 15 mg (15 mg  Intravenous Given 06/29/23 0403)  fentaNYL  (SUBLIMAZE ) injection 50 mcg (50 mcg Intravenous Given 06/29/23 0402)   IMPRESSION / MDM / ASSESSMENT AND PLAN / ED COURSE  I reviewed the triage vital signs and the nursing notes.                             The patient is on the cardiac monitor to evaluate for evidence of arrhythmia and/or significant heart rate changes. Patient's presentation is most consistent with acute presentation with potential threat to life or bodily function. This patient presents with atypical chest pain, most likely secondary to musculoskeletal injury. Differential diagnosis includes rib fracture, costochondritis, sternal fracture. Low suspicion for ACS, acute PE (PERC negative), pericarditis / myocarditis, thoracic aortic dissection, pneumothorax, pneumonia or other acute infectious process. Presentation not consistent with other acute, emergent causes of chest pain at this time. No indication for cardiac enzyme testing. Plan to order CXR to evaluate for acute cardiopulmonary causes.  Plan: EKG, CXR, pain control  Dispo: Discharge home with home care   FINAL CLINICAL IMPRESSION(S) / ED DIAGNOSES   Final diagnoses:  Chest wall pain   Rx / DC Orders   ED Discharge Orders          Ordered    cyclobenzaprine  (FLEXERIL ) 10 MG tablet  3 times daily PRN        06/29/23 0432           Note:  This document was prepared using Dragon voice recognition software and may include unintentional dictation errors.   Abubakr Wieman K, MD 06/29/23 [phone]

## 2023-06-29 NOTE — Telephone Encounter (Signed)
 Requested Prescriptions  Pending Prescriptions Disp Refills   pantoprazole  (PROTONIX ) 40 MG tablet [Pharmacy Med Name: Pantoprazole  Sodium 40 MG Oral Tablet Delayed Release] 100 tablet 1    Sig: TAKE 1 TABLET BY MOUTH DAILY AT  9 AM     Gastroenterology: Proton Pump Inhibitors Passed - 06/29/2023 10:42 AM      Passed - Valid encounter within last 12 months    Recent Outpatient Visits           6 days ago Cryptogenic stroke [facility])   [facility], Kayleen Party, DO   3 months ago Acute conjunctivitis of both eyes, unspecified acute conjunctivitis type   [facility] [place], Kayleen Party, [place]

## 2023-06-30 ENCOUNTER — Telehealth: Payer: Self-pay

## 2023-06-30 DIAGNOSIS — I69359 Hemiplegia and hemiparesis following cerebral infarction affecting unspecified side: Secondary | ICD-10-CM

## 2023-06-30 DIAGNOSIS — I6930 Unspecified sequelae of cerebral infarction: Secondary | ICD-10-CM

## 2023-06-30 DIAGNOSIS — M2540 Effusion, unspecified joint: Secondary | ICD-10-CM

## 2023-06-30 DIAGNOSIS — M545 Low back pain, unspecified: Secondary | ICD-10-CM

## 2023-06-30 DIAGNOSIS — G894 Chronic pain syndrome: Secondary | ICD-10-CM

## 2023-06-30 NOTE — Telephone Encounter (Signed)
 Copied from CRM [id number]. Topic: Clinical - Request for Lab/Test Order >> Jun 30, 2023 12:53 PM Crispin Dolphin wrote: Reason for CRM: Patient called - would like to know where she can have blood work done. States Pain clinic provider wants her to have blood work done asap. No order showing Patient states they provided paper order. Would like to know how to get it completed before next appt 6/24. Thank You

## 2023-06-30 NOTE — Telephone Encounter (Signed)
 Spoke with [place] orders given.

## 2023-06-30 NOTE — Telephone Encounter (Signed)
 Okay to start verbal orders  Domingo Friend, DO [facility] 06/30/2023, 1:07 PM

## 2023-06-30 NOTE — Telephone Encounter (Signed)
 Copied from CRM [id number]. Topic: Clinical - Home Health Verbal Orders >> Jun 30, 2023 11:05 AM Rosaria Common wrote: Caller/Agency: [facility]  Callback Number: [phone] Service Requested: Occupational Therapy, PT, Nursing  Any new concerns about the patient? Delay to start until 6/19 pending communication with pt.

## 2023-06-30 NOTE — Addendum Note (Signed)
 Addended by: Raina Bunting on: 06/30/2023 07:33 PM   Modules accepted: Orders

## 2023-06-30 NOTE — Telephone Encounter (Signed)
 I looked through her chart. Back on 06/24/23 they contacted us  with a list of labs that her Pain Doctors were ordering for her already.  The labs requested by her Pain Doctor were: Uric acid blood, CMP, ANA, CBC w Differential wbc, rheumatoid factor quant   At that time, our office advised the patient that those labs would cover everything that I needed as well, I only needed the CMP and CBC.  She was planning to have labs drawn by Pain Doctor, and they would forward copy of results to me.  Now it sounds like they are asking if we can draw ALL of the labs - including the ones for the Pain Doctor?  I am okay to do this if she needs this done on short notice, we would need details for contact information for her Pain Doctor and Phone / Fax, confirm doctors name etc.  We may not be able to check all labs in future for her Pain Doctor, but since we need labs this time - I would be willing to order all of them and fax to her Pain Doctor office.  All labs ordered as FUTURE for her to draw here.  Domingo Friend, DO [facility] 06/30/2023, 7:32 PM

## 2023-07-01 ENCOUNTER — Other Ambulatory Visit

## 2023-07-01 ENCOUNTER — Telehealth: Payer: Self-pay

## 2023-07-01 DIAGNOSIS — G43909 Migraine, unspecified, not intractable, without status migrainosus: Secondary | ICD-10-CM | POA: Diagnosis not present

## 2023-07-01 DIAGNOSIS — Z7902 Long term (current) use of antithrombotics/antiplatelets: Secondary | ICD-10-CM | POA: Diagnosis not present

## 2023-07-01 DIAGNOSIS — Z7982 Long term (current) use of aspirin: Secondary | ICD-10-CM | POA: Diagnosis not present

## 2023-07-01 DIAGNOSIS — E119 Type 2 diabetes mellitus without complications: Secondary | ICD-10-CM | POA: Diagnosis not present

## 2023-07-01 DIAGNOSIS — Z79899 Other long term (current) drug therapy: Secondary | ICD-10-CM | POA: Diagnosis not present

## 2023-07-01 DIAGNOSIS — F17210 Nicotine dependence, cigarettes, uncomplicated: Secondary | ICD-10-CM | POA: Diagnosis not present

## 2023-07-01 DIAGNOSIS — I69398 Other sequelae of cerebral infarction: Secondary | ICD-10-CM | POA: Diagnosis not present

## 2023-07-01 DIAGNOSIS — R531 Weakness: Secondary | ICD-10-CM | POA: Diagnosis not present

## 2023-07-01 DIAGNOSIS — Z7985 Long-term (current) use of injectable non-insulin antidiabetic drugs: Secondary | ICD-10-CM | POA: Diagnosis not present

## 2023-07-01 DIAGNOSIS — Z7951 Long term (current) use of inhaled steroids: Secondary | ICD-10-CM | POA: Diagnosis not present

## 2023-07-01 NOTE — Telephone Encounter (Signed)
 Reason for CRM: The patient has called to follow up on a previous request for additional information.   Their Pain Management Specialist can be reached by fax at [phone]   Felicia Horde Rogatnick MD Pain Management   Please contact the patient further if needed

## 2023-07-01 NOTE — Telephone Encounter (Signed)
 Copied from CRM [id number]. Topic: General - Other >> Jul 01, 2023  3:49 PM Everette C wrote: Reason for CRM: The patient has called to follow up on a previous request for additional information. Their Pain Management Specialist can be reached by fax at [phone]  Felicia Horde Rogatnick MD Pain Management  Please contact the patient further if needed

## 2023-07-01 NOTE — Telephone Encounter (Signed)
 Spoke with patient, she will get fax number for pain doctor. She is on the schedule for labs on Monday 6/23

## 2023-07-01 NOTE — Telephone Encounter (Signed)
 Yes, there is a phone conversation note from yesterday. This probably should have been listed under that call.  She asked us  to order lab work. I have already ordered her requested labs. She needs labs for her Pain Doctor.  Now she is sending us  the name and location and fax number to send the lab results.  She re-scheduled her lab draw to Fri 6/20  Domingo Friend, DO [facility] 07/01/2023, 4:36 PM

## 2023-07-03 ENCOUNTER — Other Ambulatory Visit

## 2023-07-03 ENCOUNTER — Other Ambulatory Visit: Payer: Self-pay

## 2023-07-03 DIAGNOSIS — M545 Low back pain, unspecified: Secondary | ICD-10-CM | POA: Diagnosis not present

## 2023-07-03 DIAGNOSIS — M2540 Effusion, unspecified joint: Secondary | ICD-10-CM | POA: Diagnosis not present

## 2023-07-03 DIAGNOSIS — I69359 Hemiplegia and hemiparesis following cerebral infarction affecting unspecified side: Secondary | ICD-10-CM | POA: Diagnosis not present

## 2023-07-03 DIAGNOSIS — I6930 Unspecified sequelae of cerebral infarction: Secondary | ICD-10-CM | POA: Diagnosis not present

## 2023-07-03 DIAGNOSIS — G8929 Other chronic pain: Secondary | ICD-10-CM | POA: Diagnosis not present

## 2023-07-03 DIAGNOSIS — G894 Chronic pain syndrome: Secondary | ICD-10-CM

## 2023-07-03 LAB — CBC WITH DIFFERENTIAL/PLATELET
Absolute Lymphocytes: 4813 {cells}/uL — ABNORMAL HIGH (ref 850–3900)
HCT: 45.2 % — ABNORMAL HIGH (ref 35.0–45.0)
Hemoglobin: 14.7 g/dL (ref 11.7–15.5)
Neutrophils Relative %: 54.6 %

## 2023-07-04 LAB — COMPREHENSIVE METABOLIC PANEL WITH GFR
AG Ratio: 1.4 (calc) (ref 1.0–2.5)
ALT: 4 U/L — ABNORMAL LOW (ref 6–29)
AST: 9 U/L — ABNORMAL LOW (ref 10–35)
Albumin: 4 g/dL (ref 3.6–5.1)
Alkaline phosphatase (APISO): 79 U/L (ref 37–153)
BUN: 7 mg/dL (ref 7–25)
CO2: 24 mmol/L (ref 20–32)
Calcium: 9.8 mg/dL (ref 8.6–10.4)
Chloride: 103 mmol/L (ref 98–110)
Creat: 0.61 mg/dL (ref 0.50–1.03)
Globulin: 2.9 g/dL (ref 1.9–3.7)
Glucose, Bld: 142 mg/dL — ABNORMAL HIGH (ref 65–99)
Potassium: 3.6 mmol/L (ref 3.5–5.3)
Sodium: 141 mmol/L (ref 135–146)
Total Bilirubin: 0.3 mg/dL (ref 0.2–1.2)
Total Protein: 6.9 g/dL (ref 6.1–8.1)
eGFR: 103 mL/min/{1.73_m2} (ref >=60)

## 2023-07-04 LAB — CBC WITH DIFFERENTIAL/PLATELET
Absolute Monocytes: 717 {cells}/uL (ref 200–950)
Basophils Absolute: 64 {cells}/uL (ref 0–200)
Basophils Relative: 0.5 %
Eosinophils Absolute: 218 {cells}/uL (ref 15–500)
Eosinophils Relative: 1.7 %
MCH: 28.7 pg (ref 27.0–33.0)
MCHC: 32.5 g/dL (ref 32.0–36.0)
MCV: 88.3 fL (ref 80.0–100.0)
MPV: 9.8 fL (ref 7.5–12.5)
Monocytes Relative: 5.6 %
Neutro Abs: 6989 {cells}/uL (ref 1500–7800)
Platelets: 428 10*3/uL — ABNORMAL HIGH (ref 140–400)
RBC: 5.12 10*6/uL — ABNORMAL HIGH (ref 3.80–5.10)
RDW: 13 % (ref 11.0–15.0)
Total Lymphocyte: 37.6 %
WBC: 12.8 10*3/uL — ABNORMAL HIGH (ref 3.8–10.8)

## 2023-07-04 LAB — RHEUMATOID FACTOR: Rheumatoid fact SerPl-aCnc: <10 [IU]/mL (ref <14)

## 2023-07-04 LAB — URIC ACID: Uric Acid, Serum: 4.1 mg/dL (ref 2.5–7.0)

## 2023-07-04 LAB — ANA: Anti Nuclear Antibody (ANA): NEGATIVE

## 2023-07-04 LAB — SEDIMENTATION RATE: Sed Rate: 25 mm/h (ref 0–30)

## 2023-07-06 ENCOUNTER — Telehealth: Payer: Self-pay

## 2023-07-06 DIAGNOSIS — M542 Cervicalgia: Secondary | ICD-10-CM | POA: Diagnosis not present

## 2023-07-06 DIAGNOSIS — M5416 Radiculopathy, lumbar region: Secondary | ICD-10-CM | POA: Diagnosis not present

## 2023-07-06 DIAGNOSIS — G8929 Other chronic pain: Secondary | ICD-10-CM | POA: Diagnosis not present

## 2023-07-06 DIAGNOSIS — Z79899 Other long term (current) drug therapy: Secondary | ICD-10-CM | POA: Diagnosis not present

## 2023-07-06 DIAGNOSIS — Z87891 Personal history of nicotine dependence: Secondary | ICD-10-CM | POA: Diagnosis not present

## 2023-07-06 DIAGNOSIS — M25571 Pain in right ankle and joints of right foot: Secondary | ICD-10-CM | POA: Diagnosis not present

## 2023-07-06 DIAGNOSIS — Z9181 History of falling: Secondary | ICD-10-CM | POA: Diagnosis not present

## 2023-07-06 NOTE — Telephone Encounter (Signed)
 Copied from CRM [id number]. Topic: Clinical - Lab/Test Results >> Jul 06, 2023  2:39 PM Winona R wrote: Pt pain management Dr. Ezzard A Rogatnick calling to request pt latest lab results. Please fax to  [phone]

## 2023-07-06 NOTE — Telephone Encounter (Signed)
Will fax over this afternoon.

## 2023-07-07 ENCOUNTER — Ambulatory Visit (INDEPENDENT_AMBULATORY_CARE_PROVIDER_SITE_OTHER): Admitting: Nurse Practitioner

## 2023-07-07 DIAGNOSIS — E119 Type 2 diabetes mellitus without complications: Secondary | ICD-10-CM | POA: Diagnosis not present

## 2023-07-07 DIAGNOSIS — F17210 Nicotine dependence, cigarettes, uncomplicated: Secondary | ICD-10-CM | POA: Diagnosis not present

## 2023-07-07 DIAGNOSIS — Z79899 Other long term (current) drug therapy: Secondary | ICD-10-CM | POA: Diagnosis not present

## 2023-07-07 DIAGNOSIS — Z7902 Long term (current) use of antithrombotics/antiplatelets: Secondary | ICD-10-CM | POA: Diagnosis not present

## 2023-07-07 DIAGNOSIS — Z7985 Long-term (current) use of injectable non-insulin antidiabetic drugs: Secondary | ICD-10-CM | POA: Diagnosis not present

## 2023-07-07 DIAGNOSIS — R531 Weakness: Secondary | ICD-10-CM | POA: Diagnosis not present

## 2023-07-07 DIAGNOSIS — G43909 Migraine, unspecified, not intractable, without status migrainosus: Secondary | ICD-10-CM | POA: Diagnosis not present

## 2023-07-07 DIAGNOSIS — I69398 Other sequelae of cerebral infarction: Secondary | ICD-10-CM | POA: Diagnosis not present

## 2023-07-07 DIAGNOSIS — Z7982 Long term (current) use of aspirin: Secondary | ICD-10-CM | POA: Diagnosis not present

## 2023-07-07 DIAGNOSIS — Z7951 Long term (current) use of inhaled steroids: Secondary | ICD-10-CM | POA: Diagnosis not present

## 2023-07-08 DIAGNOSIS — F17210 Nicotine dependence, cigarettes, uncomplicated: Secondary | ICD-10-CM | POA: Diagnosis not present

## 2023-07-08 DIAGNOSIS — E119 Type 2 diabetes mellitus without complications: Secondary | ICD-10-CM | POA: Diagnosis not present

## 2023-07-08 DIAGNOSIS — G43909 Migraine, unspecified, not intractable, without status migrainosus: Secondary | ICD-10-CM | POA: Diagnosis not present

## 2023-07-08 DIAGNOSIS — Z79899 Other long term (current) drug therapy: Secondary | ICD-10-CM | POA: Diagnosis not present

## 2023-07-08 DIAGNOSIS — R531 Weakness: Secondary | ICD-10-CM | POA: Diagnosis not present

## 2023-07-08 DIAGNOSIS — Z7902 Long term (current) use of antithrombotics/antiplatelets: Secondary | ICD-10-CM | POA: Diagnosis not present

## 2023-07-08 DIAGNOSIS — Z7985 Long-term (current) use of injectable non-insulin antidiabetic drugs: Secondary | ICD-10-CM | POA: Diagnosis not present

## 2023-07-08 DIAGNOSIS — Z7982 Long term (current) use of aspirin: Secondary | ICD-10-CM | POA: Diagnosis not present

## 2023-07-08 DIAGNOSIS — Z7951 Long term (current) use of inhaled steroids: Secondary | ICD-10-CM | POA: Diagnosis not present

## 2023-07-08 DIAGNOSIS — I69398 Other sequelae of cerebral infarction: Secondary | ICD-10-CM | POA: Diagnosis not present

## 2023-07-09 ENCOUNTER — Ambulatory Visit: Payer: Self-pay | Admitting: Family Medicine

## 2023-07-09 NOTE — Telephone Encounter (Signed)
 It looks like this was done on 07/06/23. I believe the lab results came in while I was out of office. I am reviewing my result inbox now. Would you be able to confirm that all labs from 07/03/23 were faxed over to the requested Pain Management?   Thank you!  Marsa Officer, DO [facility] 07/09/2023, 5:49 PM

## 2023-07-13 DIAGNOSIS — R531 Weakness: Secondary | ICD-10-CM | POA: Diagnosis not present

## 2023-07-13 DIAGNOSIS — Z7982 Long term (current) use of aspirin: Secondary | ICD-10-CM | POA: Diagnosis not present

## 2023-07-13 DIAGNOSIS — Z7951 Long term (current) use of inhaled steroids: Secondary | ICD-10-CM | POA: Diagnosis not present

## 2023-07-13 DIAGNOSIS — I69398 Other sequelae of cerebral infarction: Secondary | ICD-10-CM | POA: Diagnosis not present

## 2023-07-13 DIAGNOSIS — Z7902 Long term (current) use of antithrombotics/antiplatelets: Secondary | ICD-10-CM | POA: Diagnosis not present

## 2023-07-13 DIAGNOSIS — E119 Type 2 diabetes mellitus without complications: Secondary | ICD-10-CM | POA: Diagnosis not present

## 2023-07-13 DIAGNOSIS — Z7985 Long-term (current) use of injectable non-insulin antidiabetic drugs: Secondary | ICD-10-CM | POA: Diagnosis not present

## 2023-07-13 DIAGNOSIS — G43909 Migraine, unspecified, not intractable, without status migrainosus: Secondary | ICD-10-CM | POA: Diagnosis not present

## 2023-07-13 DIAGNOSIS — F17210 Nicotine dependence, cigarettes, uncomplicated: Secondary | ICD-10-CM | POA: Diagnosis not present

## 2023-07-13 DIAGNOSIS — Z79899 Other long term (current) drug therapy: Secondary | ICD-10-CM | POA: Diagnosis not present

## 2023-07-14 DIAGNOSIS — E119 Type 2 diabetes mellitus without complications: Secondary | ICD-10-CM | POA: Diagnosis not present

## 2023-07-14 DIAGNOSIS — G43909 Migraine, unspecified, not intractable, without status migrainosus: Secondary | ICD-10-CM | POA: Diagnosis not present

## 2023-07-14 DIAGNOSIS — Z7902 Long term (current) use of antithrombotics/antiplatelets: Secondary | ICD-10-CM | POA: Diagnosis not present

## 2023-07-14 DIAGNOSIS — I69398 Other sequelae of cerebral infarction: Secondary | ICD-10-CM | POA: Diagnosis not present

## 2023-07-14 DIAGNOSIS — Z7982 Long term (current) use of aspirin: Secondary | ICD-10-CM | POA: Diagnosis not present

## 2023-07-14 DIAGNOSIS — F17210 Nicotine dependence, cigarettes, uncomplicated: Secondary | ICD-10-CM | POA: Diagnosis not present

## 2023-07-14 DIAGNOSIS — R531 Weakness: Secondary | ICD-10-CM | POA: Diagnosis not present

## 2023-07-14 DIAGNOSIS — Z79899 Other long term (current) drug therapy: Secondary | ICD-10-CM | POA: Diagnosis not present

## 2023-07-14 DIAGNOSIS — Z7951 Long term (current) use of inhaled steroids: Secondary | ICD-10-CM | POA: Diagnosis not present

## 2023-07-14 DIAGNOSIS — Z7985 Long-term (current) use of injectable non-insulin antidiabetic drugs: Secondary | ICD-10-CM | POA: Diagnosis not present

## 2023-07-15 ENCOUNTER — Telehealth: Payer: Self-pay

## 2023-07-15 NOTE — Telephone Encounter (Signed)
 Spoke with Jori, verbal orders given

## 2023-07-15 NOTE — Telephone Encounter (Signed)
 Okay to proceed w/ verbal orders  Marsa Officer, DO [facility] 07/15/2023, 8:51 AM

## 2023-07-15 NOTE — Telephone Encounter (Signed)
 Copied from CRM [id number]. Topic: Clinical - Home Health Verbal Orders >> Jul 15, 2023  8:22 AM Burnard DEL wrote: Caller/Agency: [facility] Callback Number: [phone] Service Requested: Occupational Therapy(1wk6)  Speech evaluation request-reading comprehension  Any new concerns about the patient? No

## 2023-07-21 DIAGNOSIS — I69398 Other sequelae of cerebral infarction: Secondary | ICD-10-CM | POA: Diagnosis not present

## 2023-07-21 DIAGNOSIS — Z7985 Long-term (current) use of injectable non-insulin antidiabetic drugs: Secondary | ICD-10-CM | POA: Diagnosis not present

## 2023-07-21 DIAGNOSIS — Z79899 Other long term (current) drug therapy: Secondary | ICD-10-CM | POA: Diagnosis not present

## 2023-07-21 DIAGNOSIS — Z7951 Long term (current) use of inhaled steroids: Secondary | ICD-10-CM | POA: Diagnosis not present

## 2023-07-21 DIAGNOSIS — Z7982 Long term (current) use of aspirin: Secondary | ICD-10-CM | POA: Diagnosis not present

## 2023-07-21 DIAGNOSIS — R531 Weakness: Secondary | ICD-10-CM | POA: Diagnosis not present

## 2023-07-21 DIAGNOSIS — Z7902 Long term (current) use of antithrombotics/antiplatelets: Secondary | ICD-10-CM | POA: Diagnosis not present

## 2023-07-21 DIAGNOSIS — F17210 Nicotine dependence, cigarettes, uncomplicated: Secondary | ICD-10-CM | POA: Diagnosis not present

## 2023-07-21 DIAGNOSIS — E119 Type 2 diabetes mellitus without complications: Secondary | ICD-10-CM | POA: Diagnosis not present

## 2023-07-21 DIAGNOSIS — G43909 Migraine, unspecified, not intractable, without status migrainosus: Secondary | ICD-10-CM | POA: Diagnosis not present

## 2023-07-23 ENCOUNTER — Telehealth: Payer: Self-pay

## 2023-07-23 DIAGNOSIS — I69398 Other sequelae of cerebral infarction: Secondary | ICD-10-CM | POA: Diagnosis not present

## 2023-07-23 DIAGNOSIS — Z7951 Long term (current) use of inhaled steroids: Secondary | ICD-10-CM | POA: Diagnosis not present

## 2023-07-23 DIAGNOSIS — Z7982 Long term (current) use of aspirin: Secondary | ICD-10-CM | POA: Diagnosis not present

## 2023-07-23 DIAGNOSIS — R531 Weakness: Secondary | ICD-10-CM | POA: Diagnosis not present

## 2023-07-23 DIAGNOSIS — G43909 Migraine, unspecified, not intractable, without status migrainosus: Secondary | ICD-10-CM | POA: Diagnosis not present

## 2023-07-23 DIAGNOSIS — Z7902 Long term (current) use of antithrombotics/antiplatelets: Secondary | ICD-10-CM | POA: Diagnosis not present

## 2023-07-23 DIAGNOSIS — Z7985 Long-term (current) use of injectable non-insulin antidiabetic drugs: Secondary | ICD-10-CM | POA: Diagnosis not present

## 2023-07-23 DIAGNOSIS — Z79899 Other long term (current) drug therapy: Secondary | ICD-10-CM | POA: Diagnosis not present

## 2023-07-23 DIAGNOSIS — F17210 Nicotine dependence, cigarettes, uncomplicated: Secondary | ICD-10-CM | POA: Diagnosis not present

## 2023-07-23 DIAGNOSIS — E119 Type 2 diabetes mellitus without complications: Secondary | ICD-10-CM | POA: Diagnosis not present

## 2023-07-23 NOTE — Telephone Encounter (Signed)
 Copied from CRM [id number]. Topic: Clinical - Home Health Verbal Orders >> Jul 23, 2023 12:45 PM Zebedee SAUNDERS wrote: Caller/Agency: [facility] per Kristine Rushing Number: [phone] secure line Service Requested: Speech Therapy Frequency: 1xweek for 6 weeks Any new concerns about the patient? No

## 2023-07-23 NOTE — Telephone Encounter (Signed)
 Okay to proceed w/ verbal orders  Marsa Officer, DO [facility] 07/23/2023, 1:14 PM

## 2023-07-23 NOTE — Telephone Encounter (Signed)
 Left message on secure VM. Verbal orders given.

## 2023-07-24 NOTE — Addendum Note (Signed)
 Encounter addended by: Dasie Sharlet RAMAN, RN on: 07/24/2023 5:05 PM  Actions taken: Imaging Exam ended

## 2023-07-25 DIAGNOSIS — I639 Cerebral infarction, unspecified: Secondary | ICD-10-CM | POA: Diagnosis not present

## 2023-07-26 ENCOUNTER — Ambulatory Visit: Payer: Self-pay | Admitting: Cardiology

## 2023-07-28 DIAGNOSIS — Z7985 Long-term (current) use of injectable non-insulin antidiabetic drugs: Secondary | ICD-10-CM | POA: Diagnosis not present

## 2023-07-28 DIAGNOSIS — Z7951 Long term (current) use of inhaled steroids: Secondary | ICD-10-CM | POA: Diagnosis not present

## 2023-07-28 DIAGNOSIS — E119 Type 2 diabetes mellitus without complications: Secondary | ICD-10-CM | POA: Diagnosis not present

## 2023-07-28 DIAGNOSIS — Z7902 Long term (current) use of antithrombotics/antiplatelets: Secondary | ICD-10-CM | POA: Diagnosis not present

## 2023-07-28 DIAGNOSIS — R531 Weakness: Secondary | ICD-10-CM | POA: Diagnosis not present

## 2023-07-28 DIAGNOSIS — F17210 Nicotine dependence, cigarettes, uncomplicated: Secondary | ICD-10-CM | POA: Diagnosis not present

## 2023-07-28 DIAGNOSIS — Z7982 Long term (current) use of aspirin: Secondary | ICD-10-CM | POA: Diagnosis not present

## 2023-07-28 DIAGNOSIS — Z79899 Other long term (current) drug therapy: Secondary | ICD-10-CM | POA: Diagnosis not present

## 2023-07-28 DIAGNOSIS — G43909 Migraine, unspecified, not intractable, without status migrainosus: Secondary | ICD-10-CM | POA: Diagnosis not present

## 2023-07-28 DIAGNOSIS — I69398 Other sequelae of cerebral infarction: Secondary | ICD-10-CM | POA: Diagnosis not present

## 2023-07-29 DIAGNOSIS — I69398 Other sequelae of cerebral infarction: Secondary | ICD-10-CM | POA: Diagnosis not present

## 2023-07-29 DIAGNOSIS — E119 Type 2 diabetes mellitus without complications: Secondary | ICD-10-CM | POA: Diagnosis not present

## 2023-07-29 DIAGNOSIS — Z7982 Long term (current) use of aspirin: Secondary | ICD-10-CM | POA: Diagnosis not present

## 2023-07-29 DIAGNOSIS — Z79899 Other long term (current) drug therapy: Secondary | ICD-10-CM | POA: Diagnosis not present

## 2023-07-29 DIAGNOSIS — Z7985 Long-term (current) use of injectable non-insulin antidiabetic drugs: Secondary | ICD-10-CM | POA: Diagnosis not present

## 2023-07-29 DIAGNOSIS — Z7902 Long term (current) use of antithrombotics/antiplatelets: Secondary | ICD-10-CM | POA: Diagnosis not present

## 2023-07-29 DIAGNOSIS — R531 Weakness: Secondary | ICD-10-CM | POA: Diagnosis not present

## 2023-07-29 DIAGNOSIS — G43909 Migraine, unspecified, not intractable, without status migrainosus: Secondary | ICD-10-CM | POA: Diagnosis not present

## 2023-07-29 DIAGNOSIS — F17210 Nicotine dependence, cigarettes, uncomplicated: Secondary | ICD-10-CM | POA: Diagnosis not present

## 2023-07-29 DIAGNOSIS — Z7951 Long term (current) use of inhaled steroids: Secondary | ICD-10-CM | POA: Diagnosis not present

## 2023-07-31 DIAGNOSIS — R531 Weakness: Secondary | ICD-10-CM | POA: Diagnosis not present

## 2023-07-31 DIAGNOSIS — E119 Type 2 diabetes mellitus without complications: Secondary | ICD-10-CM | POA: Diagnosis not present

## 2023-07-31 DIAGNOSIS — I69398 Other sequelae of cerebral infarction: Secondary | ICD-10-CM | POA: Diagnosis not present

## 2023-07-31 DIAGNOSIS — Z7951 Long term (current) use of inhaled steroids: Secondary | ICD-10-CM | POA: Diagnosis not present

## 2023-07-31 DIAGNOSIS — Z7985 Long-term (current) use of injectable non-insulin antidiabetic drugs: Secondary | ICD-10-CM | POA: Diagnosis not present

## 2023-07-31 DIAGNOSIS — G43909 Migraine, unspecified, not intractable, without status migrainosus: Secondary | ICD-10-CM | POA: Diagnosis not present

## 2023-07-31 DIAGNOSIS — Z7982 Long term (current) use of aspirin: Secondary | ICD-10-CM | POA: Diagnosis not present

## 2023-07-31 DIAGNOSIS — Z7902 Long term (current) use of antithrombotics/antiplatelets: Secondary | ICD-10-CM | POA: Diagnosis not present

## 2023-07-31 DIAGNOSIS — Z79899 Other long term (current) drug therapy: Secondary | ICD-10-CM | POA: Diagnosis not present

## 2023-07-31 DIAGNOSIS — F17210 Nicotine dependence, cigarettes, uncomplicated: Secondary | ICD-10-CM | POA: Diagnosis not present

## 2023-08-03 DIAGNOSIS — Z79899 Other long term (current) drug therapy: Secondary | ICD-10-CM | POA: Diagnosis not present

## 2023-08-03 DIAGNOSIS — I69398 Other sequelae of cerebral infarction: Secondary | ICD-10-CM | POA: Diagnosis not present

## 2023-08-03 DIAGNOSIS — F17210 Nicotine dependence, cigarettes, uncomplicated: Secondary | ICD-10-CM | POA: Diagnosis not present

## 2023-08-03 DIAGNOSIS — Z7951 Long term (current) use of inhaled steroids: Secondary | ICD-10-CM | POA: Diagnosis not present

## 2023-08-03 DIAGNOSIS — R531 Weakness: Secondary | ICD-10-CM | POA: Diagnosis not present

## 2023-08-03 DIAGNOSIS — G43909 Migraine, unspecified, not intractable, without status migrainosus: Secondary | ICD-10-CM | POA: Diagnosis not present

## 2023-08-03 DIAGNOSIS — Z7982 Long term (current) use of aspirin: Secondary | ICD-10-CM | POA: Diagnosis not present

## 2023-08-03 DIAGNOSIS — Z7902 Long term (current) use of antithrombotics/antiplatelets: Secondary | ICD-10-CM | POA: Diagnosis not present

## 2023-08-03 DIAGNOSIS — E119 Type 2 diabetes mellitus without complications: Secondary | ICD-10-CM | POA: Diagnosis not present

## 2023-08-03 DIAGNOSIS — Z7985 Long-term (current) use of injectable non-insulin antidiabetic drugs: Secondary | ICD-10-CM | POA: Diagnosis not present

## 2023-08-04 DIAGNOSIS — Z7902 Long term (current) use of antithrombotics/antiplatelets: Secondary | ICD-10-CM | POA: Diagnosis not present

## 2023-08-04 DIAGNOSIS — R531 Weakness: Secondary | ICD-10-CM | POA: Diagnosis not present

## 2023-08-04 DIAGNOSIS — E119 Type 2 diabetes mellitus without complications: Secondary | ICD-10-CM | POA: Diagnosis not present

## 2023-08-04 DIAGNOSIS — I69398 Other sequelae of cerebral infarction: Secondary | ICD-10-CM | POA: Diagnosis not present

## 2023-08-04 DIAGNOSIS — Z7985 Long-term (current) use of injectable non-insulin antidiabetic drugs: Secondary | ICD-10-CM | POA: Diagnosis not present

## 2023-08-04 DIAGNOSIS — F17210 Nicotine dependence, cigarettes, uncomplicated: Secondary | ICD-10-CM | POA: Diagnosis not present

## 2023-08-04 DIAGNOSIS — Z79899 Other long term (current) drug therapy: Secondary | ICD-10-CM | POA: Diagnosis not present

## 2023-08-04 DIAGNOSIS — Z7951 Long term (current) use of inhaled steroids: Secondary | ICD-10-CM | POA: Diagnosis not present

## 2023-08-04 DIAGNOSIS — Z7982 Long term (current) use of aspirin: Secondary | ICD-10-CM | POA: Diagnosis not present

## 2023-08-04 DIAGNOSIS — G43909 Migraine, unspecified, not intractable, without status migrainosus: Secondary | ICD-10-CM | POA: Diagnosis not present

## 2023-08-05 DIAGNOSIS — G8929 Other chronic pain: Secondary | ICD-10-CM | POA: Diagnosis not present

## 2023-08-05 DIAGNOSIS — Z79899 Other long term (current) drug therapy: Secondary | ICD-10-CM | POA: Diagnosis not present

## 2023-08-05 DIAGNOSIS — M542 Cervicalgia: Secondary | ICD-10-CM | POA: Diagnosis not present

## 2023-08-05 DIAGNOSIS — M5416 Radiculopathy, lumbar region: Secondary | ICD-10-CM | POA: Diagnosis not present

## 2023-08-05 DIAGNOSIS — M25571 Pain in right ankle and joints of right foot: Secondary | ICD-10-CM | POA: Diagnosis not present

## 2023-08-05 DIAGNOSIS — Z9181 History of falling: Secondary | ICD-10-CM | POA: Diagnosis not present

## 2023-08-06 DIAGNOSIS — Z7902 Long term (current) use of antithrombotics/antiplatelets: Secondary | ICD-10-CM | POA: Diagnosis not present

## 2023-08-06 DIAGNOSIS — F17210 Nicotine dependence, cigarettes, uncomplicated: Secondary | ICD-10-CM | POA: Diagnosis not present

## 2023-08-06 DIAGNOSIS — R531 Weakness: Secondary | ICD-10-CM | POA: Diagnosis not present

## 2023-08-06 DIAGNOSIS — G43909 Migraine, unspecified, not intractable, without status migrainosus: Secondary | ICD-10-CM | POA: Diagnosis not present

## 2023-08-06 DIAGNOSIS — I69398 Other sequelae of cerebral infarction: Secondary | ICD-10-CM | POA: Diagnosis not present

## 2023-08-06 DIAGNOSIS — Z7951 Long term (current) use of inhaled steroids: Secondary | ICD-10-CM | POA: Diagnosis not present

## 2023-08-06 DIAGNOSIS — Z79899 Other long term (current) drug therapy: Secondary | ICD-10-CM | POA: Diagnosis not present

## 2023-08-06 DIAGNOSIS — E119 Type 2 diabetes mellitus without complications: Secondary | ICD-10-CM | POA: Diagnosis not present

## 2023-08-06 DIAGNOSIS — Z7982 Long term (current) use of aspirin: Secondary | ICD-10-CM | POA: Diagnosis not present

## 2023-08-06 DIAGNOSIS — Z7985 Long-term (current) use of injectable non-insulin antidiabetic drugs: Secondary | ICD-10-CM | POA: Diagnosis not present

## 2023-08-09 ENCOUNTER — Other Ambulatory Visit: Payer: Self-pay | Admitting: Family Medicine

## 2023-08-09 DIAGNOSIS — Z794 Long term (current) use of insulin: Secondary | ICD-10-CM

## 2023-08-10 DIAGNOSIS — I69398 Other sequelae of cerebral infarction: Secondary | ICD-10-CM | POA: Diagnosis not present

## 2023-08-10 DIAGNOSIS — G43909 Migraine, unspecified, not intractable, without status migrainosus: Secondary | ICD-10-CM | POA: Diagnosis not present

## 2023-08-10 DIAGNOSIS — Z79899 Other long term (current) drug therapy: Secondary | ICD-10-CM | POA: Diagnosis not present

## 2023-08-10 DIAGNOSIS — Z7982 Long term (current) use of aspirin: Secondary | ICD-10-CM | POA: Diagnosis not present

## 2023-08-10 DIAGNOSIS — Z7951 Long term (current) use of inhaled steroids: Secondary | ICD-10-CM | POA: Diagnosis not present

## 2023-08-10 DIAGNOSIS — R531 Weakness: Secondary | ICD-10-CM | POA: Diagnosis not present

## 2023-08-10 DIAGNOSIS — E119 Type 2 diabetes mellitus without complications: Secondary | ICD-10-CM | POA: Diagnosis not present

## 2023-08-10 DIAGNOSIS — Z7902 Long term (current) use of antithrombotics/antiplatelets: Secondary | ICD-10-CM | POA: Diagnosis not present

## 2023-08-10 DIAGNOSIS — F17210 Nicotine dependence, cigarettes, uncomplicated: Secondary | ICD-10-CM | POA: Diagnosis not present

## 2023-08-10 DIAGNOSIS — Z7985 Long-term (current) use of injectable non-insulin antidiabetic drugs: Secondary | ICD-10-CM | POA: Diagnosis not present

## 2023-08-11 NOTE — Telephone Encounter (Signed)
 Requested medication (s) are due for refill today: yes  Requested medication (s) are on the active medication list: yes  Last refill:  09/23/22  Future visit scheduled: no  Notes to clinic:  Medication not assigned to a protocol, review manually.      Requested Prescriptions  Pending Prescriptions Disp Refills   MOUNJARO  10 MG/0.5ML Pen [Pharmacy Med Name: MOUNJARO  PEN 10MG /0.5ML] 6 mL 3    Sig: INJECT THE CONTENTS OF ONE PEN  SUBCUTANEOUSLY WEEKLY AS  DIRECTED     Off-Protocol Failed - 08/11/2023 11:23 AM      Failed - Medication not assigned to a protocol, review manually.      Passed - Valid encounter within last 12 months    Recent Outpatient Visits           1 month ago Cryptogenic stroke [facility])   [facility] , Marsa PARAS, DO   5 months ago Acute conjunctivitis of both eyes, unspecified acute conjunctivitis type   [facility], Marsa PARAS, [place]

## 2023-08-12 DIAGNOSIS — Z7985 Long-term (current) use of injectable non-insulin antidiabetic drugs: Secondary | ICD-10-CM | POA: Diagnosis not present

## 2023-08-12 DIAGNOSIS — Z7951 Long term (current) use of inhaled steroids: Secondary | ICD-10-CM | POA: Diagnosis not present

## 2023-08-12 DIAGNOSIS — E119 Type 2 diabetes mellitus without complications: Secondary | ICD-10-CM | POA: Diagnosis not present

## 2023-08-12 DIAGNOSIS — G43909 Migraine, unspecified, not intractable, without status migrainosus: Secondary | ICD-10-CM | POA: Diagnosis not present

## 2023-08-12 DIAGNOSIS — Z7902 Long term (current) use of antithrombotics/antiplatelets: Secondary | ICD-10-CM | POA: Diagnosis not present

## 2023-08-12 DIAGNOSIS — Z79899 Other long term (current) drug therapy: Secondary | ICD-10-CM | POA: Diagnosis not present

## 2023-08-12 DIAGNOSIS — R531 Weakness: Secondary | ICD-10-CM | POA: Diagnosis not present

## 2023-08-12 DIAGNOSIS — Z7982 Long term (current) use of aspirin: Secondary | ICD-10-CM | POA: Diagnosis not present

## 2023-08-12 DIAGNOSIS — F17210 Nicotine dependence, cigarettes, uncomplicated: Secondary | ICD-10-CM | POA: Diagnosis not present

## 2023-08-12 DIAGNOSIS — I69398 Other sequelae of cerebral infarction: Secondary | ICD-10-CM | POA: Diagnosis not present

## 2023-08-13 DIAGNOSIS — Z7985 Long-term (current) use of injectable non-insulin antidiabetic drugs: Secondary | ICD-10-CM | POA: Diagnosis not present

## 2023-08-13 DIAGNOSIS — I69398 Other sequelae of cerebral infarction: Secondary | ICD-10-CM | POA: Diagnosis not present

## 2023-08-13 DIAGNOSIS — G43909 Migraine, unspecified, not intractable, without status migrainosus: Secondary | ICD-10-CM | POA: Diagnosis not present

## 2023-08-13 DIAGNOSIS — Z79899 Other long term (current) drug therapy: Secondary | ICD-10-CM | POA: Diagnosis not present

## 2023-08-13 DIAGNOSIS — Z7982 Long term (current) use of aspirin: Secondary | ICD-10-CM | POA: Diagnosis not present

## 2023-08-13 DIAGNOSIS — F17210 Nicotine dependence, cigarettes, uncomplicated: Secondary | ICD-10-CM | POA: Diagnosis not present

## 2023-08-13 DIAGNOSIS — E119 Type 2 diabetes mellitus without complications: Secondary | ICD-10-CM | POA: Diagnosis not present

## 2023-08-13 DIAGNOSIS — Z7951 Long term (current) use of inhaled steroids: Secondary | ICD-10-CM | POA: Diagnosis not present

## 2023-08-13 DIAGNOSIS — R531 Weakness: Secondary | ICD-10-CM | POA: Diagnosis not present

## 2023-08-13 DIAGNOSIS — Z7902 Long term (current) use of antithrombotics/antiplatelets: Secondary | ICD-10-CM | POA: Diagnosis not present

## 2023-08-17 DIAGNOSIS — Z79899 Other long term (current) drug therapy: Secondary | ICD-10-CM | POA: Diagnosis not present

## 2023-08-17 DIAGNOSIS — Z7982 Long term (current) use of aspirin: Secondary | ICD-10-CM | POA: Diagnosis not present

## 2023-08-17 DIAGNOSIS — Z7985 Long-term (current) use of injectable non-insulin antidiabetic drugs: Secondary | ICD-10-CM | POA: Diagnosis not present

## 2023-08-17 DIAGNOSIS — E119 Type 2 diabetes mellitus without complications: Secondary | ICD-10-CM | POA: Diagnosis not present

## 2023-08-17 DIAGNOSIS — I69398 Other sequelae of cerebral infarction: Secondary | ICD-10-CM | POA: Diagnosis not present

## 2023-08-17 DIAGNOSIS — G43909 Migraine, unspecified, not intractable, without status migrainosus: Secondary | ICD-10-CM | POA: Diagnosis not present

## 2023-08-17 DIAGNOSIS — Z7951 Long term (current) use of inhaled steroids: Secondary | ICD-10-CM | POA: Diagnosis not present

## 2023-08-17 DIAGNOSIS — R531 Weakness: Secondary | ICD-10-CM | POA: Diagnosis not present

## 2023-08-17 DIAGNOSIS — F17210 Nicotine dependence, cigarettes, uncomplicated: Secondary | ICD-10-CM | POA: Diagnosis not present

## 2023-08-17 DIAGNOSIS — Z7902 Long term (current) use of antithrombotics/antiplatelets: Secondary | ICD-10-CM | POA: Diagnosis not present

## 2023-08-18 DIAGNOSIS — G43909 Migraine, unspecified, not intractable, without status migrainosus: Secondary | ICD-10-CM | POA: Diagnosis not present

## 2023-08-18 DIAGNOSIS — Z79899 Other long term (current) drug therapy: Secondary | ICD-10-CM | POA: Diagnosis not present

## 2023-08-18 DIAGNOSIS — Z7902 Long term (current) use of antithrombotics/antiplatelets: Secondary | ICD-10-CM | POA: Diagnosis not present

## 2023-08-18 DIAGNOSIS — Z7951 Long term (current) use of inhaled steroids: Secondary | ICD-10-CM | POA: Diagnosis not present

## 2023-08-18 DIAGNOSIS — R531 Weakness: Secondary | ICD-10-CM | POA: Diagnosis not present

## 2023-08-18 DIAGNOSIS — F17210 Nicotine dependence, cigarettes, uncomplicated: Secondary | ICD-10-CM | POA: Diagnosis not present

## 2023-08-18 DIAGNOSIS — Z7985 Long-term (current) use of injectable non-insulin antidiabetic drugs: Secondary | ICD-10-CM | POA: Diagnosis not present

## 2023-08-18 DIAGNOSIS — E119 Type 2 diabetes mellitus without complications: Secondary | ICD-10-CM | POA: Diagnosis not present

## 2023-08-18 DIAGNOSIS — I69398 Other sequelae of cerebral infarction: Secondary | ICD-10-CM | POA: Diagnosis not present

## 2023-08-18 DIAGNOSIS — Z7982 Long term (current) use of aspirin: Secondary | ICD-10-CM | POA: Diagnosis not present

## 2023-08-19 ENCOUNTER — Telehealth: Admitting: Family Medicine

## 2023-08-19 ENCOUNTER — Ambulatory Visit: Payer: Self-pay

## 2023-08-19 DIAGNOSIS — B372 Candidiasis of skin and nail: Secondary | ICD-10-CM

## 2023-08-19 DIAGNOSIS — R3989 Other symptoms and signs involving the genitourinary system: Secondary | ICD-10-CM | POA: Diagnosis not present

## 2023-08-19 MED ORDER — NYSTATIN 100000 UNIT/GM EX POWD
1.0000 | Freq: Three times a day (TID) | CUTANEOUS | 0 refills | Status: AC
Start: 2023-08-19 — End: ?

## 2023-08-19 MED ORDER — CEPHALEXIN 500 MG PO CAPS: 500.0000 mg | ORAL_CAPSULE | Freq: Two times a day (BID) | ORAL | 0 refills | Status: AC

## 2023-08-19 NOTE — Telephone Encounter (Signed)
 We would need to do at least Mychart Video Virtual Visit. It does not require a sensitive exam but I have to take a medical history and come up with diagnosis / treatment plan. Cannot do rx management casually over phone messages.  There are other [facility] option as well. This type of complaint they may be able to help faster. I don't see much availability but I am willing to double book for a virtual visit if she is agreeable to end of the day one of these next few days to add in as a video visit  Thank you!  Marsa Officer, DO [facility] 08/19/2023, 2:03 PM

## 2023-08-19 NOTE — Progress Notes (Signed)
 Virtual Visit Consent   [name], you are scheduled for a virtual visit with a [place] provider today. Just as with appointments in the office, your consent must be obtained to participate. Your consent will be active for this visit and any virtual visit you may have with one of our providers in the next 365 days. If you have a MyChart account, a copy of this consent can be sent to you electronically.  As this is a virtual visit, video technology does not allow for your provider to perform a traditional examination. This may limit your provider's ability to fully assess your condition. If your provider identifies any concerns that need to be evaluated in person or the need to arrange testing (such as labs, EKG, etc.), we will make arrangements to do so. Although advances in technology are sophisticated, we cannot ensure that it will always work on either your end or our end. If the connection with a video visit is poor, the visit may have to be switched to a telephone visit. With either a video or telephone visit, we are not always able to ensure that we have a secure connection.  By engaging in this virtual visit, you consent to the provision of healthcare and authorize for your insurance to be billed (if applicable) for the services provided during this visit. Depending on your insurance coverage, you may receive a charge related to this service.  I need to obtain your verbal consent now. Are you willing to proceed with your visit today? [name] has provided verbal consent on 08/19/2023 for a virtual visit (video or telephone). Chiquita CHRISTELLA Barefoot, NP  Date: 08/19/2023 3:28 PM   Virtual Visit via Video Note   I, Chiquita CHRISTELLA Barefoot, connected with  [name]  ([id number], 08-20-1965) on 08/19/23 at  3:45 PM EDT by a video-enabled telemedicine application and verified that I am speaking with the correct person using two identifiers.  Location: Patient: Virtual Visit Location Patient:  Home Provider: Virtual Visit Location Provider: Home Office   I discussed the limitations of evaluation and management by telemedicine and the availability of in person appointments. The patient expressed understanding and agreed to proceed.    History of Present Illness: [name] is a 58 y.o. who identifies as a female who was assigned female at birth, and is being seen today for dysuria  Onset was week ago with burning sensation in privates Associated symptoms are increased urination Wears adult briefs and changes them often   Modifying factors are only using brief when needing to void. Not wearing outside of sleeping hours.  Has not had UTi I last 3 months Denies chest pain, shortness of breath, fevers, chills, pelvic or abdominal pain, blood in urine, odor   Problems:  Patient Active Problem List   Diagnosis Date Noted   Hemiplegia affecting dominant side, post-stroke (HCC) 06/24/2023   Pressure injury of skin 06/03/2023   Hypotension 06/03/2023   GERD (gastroesophageal reflux disease)    Anxiety    CVA (cerebral vascular accident) (HCC) 06/02/2023   Acute osteomyelitis of toe of left foot (HCC) 03/26/2023   Osteomyelitis of left foot (HCC) 03/25/2023   Type 2 diabetes mellitus with peripheral vascular disease (HCC) 03/25/2023   PVD (peripheral vascular disease) (HCC) 03/25/2023   HTN (hypertension) 03/25/2023   Chronic right hip pain 05/06/2022   Chronic pain 01/14/2022   Right sided weakness 12/18/2021   Hypokalemia 12/10/2021   High anion gap metabolic acidosis  12/09/2021   Obesity (BMI 30-39.9) 12/09/2021   Hemiparesis of right dominant side as late effect of cerebrovascular disease (HCC) 09/21/2020   Vitamin D  deficiency 08/22/2020   Urinary incontinence 07/27/2020   Centrilobular emphysema (HCC) 07/10/2020   Moderate episode of recurrent major depressive disorder (HCC) 07/10/2020   Chronic pain syndrome 07/10/2020   Intractable chronic migraine without aura  and with status migrainosus 07/10/2020   Chronic hypoxemic respiratory failure (HCC) 09/23/2017   Pulmonary nodules 09/23/2017   Trimalleolar fracture 08/09/2017   TIA (transient ischemic attack) 04/07/2017   Depression 04/07/2017   History of cerebrovascular accident (CVA) with residual deficit    Complicated migraine    Brain TIA 10/11/2012   Hyperlipidemia 10/11/2012   Nocturnal hypoxemia 10/11/2012   Diabetes mellitus without complication (HCC) 10/11/2012   Tobacco abuse 10/11/2012   COPD (chronic obstructive pulmonary disease) (HCC) 10/11/2012    Allergies:  Allergies  Allergen Reactions   Tramadol Nausea And Vomiting and Hypertension   Augmentin [Amoxicillin-Pot Clavulanate] Rash    Tolerated Zosyn  03/26/23   Medications:  Current Outpatient Medications:    albuterol  (VENTOLIN  HFA) 108 (90 Base) MCG/ACT inhaler, INHALE 2 INHALATIONS BY MOUTH  EVERY 4 HOURS AS NEEDED FOR  WHEEZING OR FOR SHORTNESS OF  BREATH, Disp: 51 g, Rfl: 2   aspirin  EC 81 MG tablet, Take 1 tablet (81 mg total) by mouth daily. Swallow whole., Disp: 90 tablet, Rfl: 0   atorvastatin  (LIPITOR) 40 MG tablet, TAKE 1 TABLET BY MOUTH AT  BEDTIME, Disp: 90 tablet, Rfl: 3   azelastine  (ASTELIN ) 0.1 % nasal spray, Place 2 sprays into both nostrils 2 (two) times daily. (Patient not taking: Reported on 06/02/2023), Disp: 30 mL, Rfl: 12   buPROPion  (WELLBUTRIN  XL) 150 MG 24 hr tablet, TAKE 1 TABLET BY MOUTH DAILY, Disp: 100 tablet, Rfl: 1   citalopram  (CELEXA ) 40 MG tablet, TAKE 1 TABLET BY MOUTH DAILY, Disp: 100 tablet, Rfl: 1   clopidogrel  (PLAVIX ) 75 MG tablet, TAKE 1 TABLET BY MOUTH DAILY AT  9AM, Disp: 100 tablet, Rfl: 1   fluticasone  (FLONASE ) 50 MCG/ACT nasal spray, Place 2 sprays into both nostrils daily. Use for 4-6 weeks then stop and use seasonally or as needed., Disp: 16 g, Rfl: 0   gabapentin  (NEURONTIN ) 600 MG tablet, TAKE 1 TABLET BY MOUTH 4 TIMES  DAILY, Disp: 400 tablet, Rfl: 2   leptospermum manuka honey  (MEDIHONEY) gel, Apply 1 Application topically daily., Disp: 44 mL, Rfl: 0   methocarbamol  (ROBAXIN ) 500 MG tablet, Take 500 mg by mouth 3 (three) times daily. (Patient not taking: Reported on 06/23/2023), Disp: , Rfl:    midodrine  (PROAMATINE ) 5 MG tablet, Take 1 tablet (5 mg total) by mouth 3 (three) times daily with meals. (Patient not taking: Reported on 06/23/2023), Disp: 21 tablet, Rfl: 0   MOUNJARO  10 MG/0.5ML Pen, INJECT THE CONTENTS OF ONE PEN  SUBCUTANEOUSLY WEEKLY AS  DIRECTED, Disp: 6 mL, Rfl: 3   mupirocin  ointment (BACTROBAN ) 2 %, Apply 1 Application topically 2 (two) times daily. For 10 days, Disp: 22 g, Rfl: 0   nicotine  (NICODERM CQ  - DOSED IN MG/24 HOURS) 14 mg/24hr patch, Place onto the skin., Disp: , Rfl:    nystatin  (MYCOSTATIN /NYSTOP ) powder, APPLY 1 APPLICATION TOPICALLY THREE TIMES DAILY AS NEEDED FOR YEAST RASH INTERTRIGO, Disp: 30 g, Rfl: 11   ondansetron  (ZOFRAN -ODT) 4 MG disintegrating tablet, DISSOLVE 1 TABLET ON THE TONGUE EVERY 8 HOURS AS NEEDED FOR NAUSEA AND VOMITING, Disp: 30 tablet, Rfl:  2   oxyCODONE -acetaminophen  (PERCOCET) 10-325 MG tablet, Take 1 tablet by mouth every 6 (six) hours as needed for pain., Disp: , Rfl:    pantoprazole  (PROTONIX ) 40 MG tablet, TAKE 1 TABLET BY MOUTH DAILY AT  9 AM, Disp: 100 tablet, Rfl: 1   QUEtiapine  (SEROQUEL ) 100 MG tablet, TAKE 1 TABLET BY MOUTH DAILY AT  9 PM AT BEDTIME, Disp: 100 tablet, Rfl: 0   rizatriptan  (MAXALT -MLT) 10 MG disintegrating tablet, Take 1 tablet (10 mg total) by mouth as needed. May repeat in 2 hours if needed, Disp: 10 tablet, Rfl: 2   topiramate  (TOPAMAX ) 100 MG tablet, TAKE 1 TABLET BY MOUTH TWICE  DAILY, Disp: 200 tablet, Rfl: 2   TRELEGY ELLIPTA  100-62.5-25 MCG/ACT AEPB, USE 1 INHALATION BY MOUTH ONCE  DAILY AT THE SAME TIME EACH DAY, Disp: 180 each, Rfl: 3   trimethoprim -polymyxin b  (POLYTRIM ) ophthalmic solution, Place 1 drop into both eyes 4 (four) times daily. For 7-10 days (Patient not taking: Reported  on 06/23/2023), Disp: 10 mL, Rfl: 0   Vitamin D , Ergocalciferol , (DRISDOL ) 1.25 MG (50000 UNIT) CAPS capsule, TAKE 1 CAPSULE BY MOUTH EVERY WEDNESDAY @9AM , Disp: 4 capsule, Rfl: 11  Observations/Objective: Patient is well-developed, well-nourished in no acute distress.  Resting comfortably  at home.  Head is normocephalic, atraumatic.  No labored breathing.  Speech is clear and coherent with logical content.  Patient is alert and oriented at baseline.    Assessment and Plan:  1. Suspected UTI (Primary)   - cephALEXin  (KEFLEX ) 500 MG capsule; Take 1 capsule (500 mg total) by mouth 2 (two) times daily for 7 days.  Dispense: 14 capsule; Refill: 0  2. Yeast infection of the skin  - nystatin  (MYCOSTATIN /NYSTOP ) powder; Apply 1 Application topically 3 (three) times daily.  Dispense: 15 g; Refill: 0  -limited ability to get sample, will try with Keflex  with strict in person precautions reviewed  -given nystatin  powder for possible yeast on skin   -no other red flags for stone or kidney infection -increase fluids -complete medication as discussed -prevention discussed and on AVS  Reviewed side effects, risks and benefits of medication.    Patient acknowledged agreement and understanding of the plan.   Past Medical, Surgical, Social History, Allergies, and Medications have been Reviewed.   Follow Up Instructions: I discussed the assessment and treatment plan with the patient. The patient was provided an opportunity to ask questions and all were answered. The patient agreed with the plan and demonstrated an understanding of the instructions.  A copy of instructions were sent to the patient via MyChart unless otherwise noted below.    The patient was advised to call back or seek an in-person evaluation if the symptoms worsen or if the condition fails to improve as anticipated.    Chiquita CHRISTELLA Barefoot, NP

## 2023-08-19 NOTE — Telephone Encounter (Signed)
 Spoke with patient, explained to her to log into mychart, schedule  appointment, urgent care, virtual visit. She will schedule an urgent care appointment.

## 2023-08-19 NOTE — Patient Instructions (Addendum)
 [name], thank you for joining Chiquita CHRISTELLA Barefoot, NP for today's virtual visit.  While this provider is not your primary care provider (PCP), if your PCP is located in our provider database this encounter information will be shared with them immediately following your visit.   A [facility] MyChart account gives you access to today's visit and all your visits, tests, and labs performed at [facility]  click here if you don't have a [facility] MyChart account or go to [email or web address]  Consent: (Patient) [name] provided verbal consent for this virtual visit at the beginning of the encounter.  Current Medications:  Current Outpatient Medications:    cephALEXin  (KEFLEX ) 500 MG capsule, Take 1 capsule (500 mg total) by mouth 2 (two) times daily for 7 days., Disp: 14 capsule, Rfl: 0   nystatin  (MYCOSTATIN /NYSTOP ) powder, Apply 1 Application topically 3 (three) times daily., Disp: 15 g, Rfl: 0   albuterol  (VENTOLIN  HFA) 108 (90 Base) MCG/ACT inhaler, INHALE 2 INHALATIONS BY MOUTH  EVERY 4 HOURS AS NEEDED FOR  WHEEZING OR FOR SHORTNESS OF  BREATH, Disp: 51 g, Rfl: 2   aspirin  EC 81 MG tablet, Take 1 tablet (81 mg total) by mouth daily. Swallow whole., Disp: 90 tablet, Rfl: 0   atorvastatin  (LIPITOR) 40 MG tablet, TAKE 1 TABLET BY MOUTH AT  BEDTIME, Disp: 90 tablet, Rfl: 3   azelastine  (ASTELIN ) 0.1 % nasal spray, Place 2 sprays into both nostrils 2 (two) times daily. (Patient not taking: Reported on 06/02/2023), Disp: 30 mL, Rfl: 12   buPROPion  (WELLBUTRIN  XL) 150 MG 24 hr tablet, TAKE 1 TABLET BY MOUTH DAILY, Disp: 100 tablet, Rfl: 1   citalopram  (CELEXA ) 40 MG tablet, TAKE 1 TABLET BY MOUTH DAILY, Disp: 100 tablet, Rfl: 1   clopidogrel  (PLAVIX ) 75 MG tablet, TAKE 1 TABLET BY MOUTH DAILY AT  9AM, Disp: 100 tablet, Rfl: 1   fluticasone  (FLONASE ) 50 MCG/ACT nasal spray, Place 2 sprays into both nostrils daily. Use for 4-6 weeks then stop and use seasonally or as  needed., Disp: 16 g, Rfl: 0   gabapentin  (NEURONTIN ) 600 MG tablet, TAKE 1 TABLET BY MOUTH 4 TIMES  DAILY, Disp: 400 tablet, Rfl: 2   leptospermum manuka honey (MEDIHONEY) gel, Apply 1 Application topically daily., Disp: 44 mL, Rfl: 0   methocarbamol  (ROBAXIN ) 500 MG tablet, Take 500 mg by mouth 3 (three) times daily. (Patient not taking: Reported on 06/23/2023), Disp: , Rfl:    midodrine  (PROAMATINE ) 5 MG tablet, Take 1 tablet (5 mg total) by mouth 3 (three) times daily with meals. (Patient not taking: Reported on 06/23/2023), Disp: 21 tablet, Rfl: 0   MOUNJARO  10 MG/0.5ML Pen, INJECT THE CONTENTS OF ONE PEN  SUBCUTANEOUSLY WEEKLY AS  DIRECTED, Disp: 6 mL, Rfl: 3   mupirocin  ointment (BACTROBAN ) 2 %, Apply 1 Application topically 2 (two) times daily. For 10 days, Disp: 22 g, Rfl: 0   nicotine  (NICODERM CQ  - DOSED IN MG/24 HOURS) 14 mg/24hr patch, Place onto the skin., Disp: , Rfl:    ondansetron  (ZOFRAN -ODT) 4 MG disintegrating tablet, DISSOLVE 1 TABLET ON THE TONGUE EVERY 8 HOURS AS NEEDED FOR NAUSEA AND VOMITING, Disp: 30 tablet, Rfl: 2   oxyCODONE -acetaminophen  (PERCOCET) 10-325 MG tablet, Take 1 tablet by mouth every 6 (six) hours as needed for pain., Disp: , Rfl:    pantoprazole  (PROTONIX ) 40 MG tablet, TAKE 1 TABLET BY MOUTH DAILY AT  9 AM, Disp: 100 tablet, Rfl: 1   QUEtiapine  (SEROQUEL )  100 MG tablet, TAKE 1 TABLET BY MOUTH DAILY AT  9 PM AT BEDTIME, Disp: 100 tablet, Rfl: 0   rizatriptan  (MAXALT -MLT) 10 MG disintegrating tablet, Take 1 tablet (10 mg total) by mouth as needed. May repeat in 2 hours if needed, Disp: 10 tablet, Rfl: 2   topiramate  (TOPAMAX ) 100 MG tablet, TAKE 1 TABLET BY MOUTH TWICE  DAILY, Disp: 200 tablet, Rfl: 2   TRELEGY ELLIPTA  100-62.5-25 MCG/ACT AEPB, USE 1 INHALATION BY MOUTH ONCE  DAILY AT THE SAME TIME EACH DAY, Disp: 180 each, Rfl: 3   trimethoprim -polymyxin b  (POLYTRIM ) ophthalmic solution, Place 1 drop into both eyes 4 (four) times daily. For 7-10 days (Patient not  taking: Reported on 06/23/2023), Disp: 10 mL, Rfl: 0   Vitamin D , Ergocalciferol , (DRISDOL ) 1.25 MG (50000 UNIT) CAPS capsule, TAKE 1 CAPSULE BY MOUTH EVERY WEDNESDAY @9AM , Disp: 4 capsule, Rfl: 11   Medications ordered in this encounter:  Meds ordered this encounter  Medications   cephALEXin  (KEFLEX ) 500 MG capsule    Sig: Take 1 capsule (500 mg total) by mouth 2 (two) times daily for 7 days.    Dispense:  14 capsule    Refill:  0    Supervising Provider:   LAMPTEY, PHILIP O [[id number]]   nystatin  (MYCOSTATIN /NYSTOP ) powder    Sig: Apply 1 Application topically 3 (three) times daily.    Dispense:  15 g    Refill:  0    Supervising Provider:   BLAISE ALEENE KIDD [[id number]]     *If you need refills on other medications prior to your next appointment, please contact your pharmacy*  Follow-Up: Call back or seek an in-person evaluation if the symptoms worsen or if the condition fails to improve as anticipated.  [facility] [phone]  Other Instructions  -limited ability to get sample, will try with Keflex  with strict in person precautions reviewed- after 2 days if getting worse   -given nystatin  powder for possible yeast on skin   -no other red flags for stone or kidney infection -increase fluids -complete medication as discussed -prevention discussed and on AVS    If you have been instructed to have an in-person evaluation today at a local Urgent Care facility, please use the link below. It will take you to a list of all of our available [facility], including address, phone number and hours of operation. Please do not delay care.  [facility]  If you or a family member do not have a primary care provider, use the link below to schedule a visit and establish care. When you choose a [facility] primary care physician or advanced practice provider, you gain a long-term partner in health. Find a Primary Care Provider  Learn more about [facility]'s in-office and virtual care options: [facility] - Get Care Now

## 2023-08-19 NOTE — Telephone Encounter (Signed)
 FYI Only or Action Required?: FYI only for provider.  Patient was last seen in primary care on 06/23/2023 by Edman Marsa PARAS, DO.  Called Nurse Triage reporting Vaginal Pain.  Symptoms began several days ago.  Interventions attempted: Nothing.  Symptoms are: gradually worsening.  Triage Disposition: See Physician Within 24 Hours  Patient/caregiver understands and will follow disposition?: No, wishes to speak with PCP       Copied from CRM #[id number]. Topic: Clinical - Red Word Triage >> Aug 19, 2023 12:46 PM DeAngela L wrote: Red Word that prompted transfer to Nurse Triage: patient is having a burning in the female area this 4th day, patient is bed redden and she is in a diaper so she is not sure of all the concerns she is having   Pt num [phone] (M)       Reason for Disposition  [1] MILD to MODERATE vaginal pain AND [2] present > 24 hours  (Exception: Chronic pain.)  Answer Assessment - Initial Assessment Questions Patient reports she is bedridden and would not be able to come in for an appointment, and wanted to see if Dr. Edman would prescribe her something for her symptoms. Please advise.       1. SYMPTOM: What's the main symptom you're concerned about? (e.g., pain, itching, dryness)     Burning sensation  2. LOCATION: Where is the burning sensation located? (e.g., inside/outside, left/right)     In my female area 3. ONSET: When did the burning sensation start?     4 days ago  4. PAIN: Is there any pain? If Yes, ask: How bad is it? (Scale: 1-10; mild, moderate, severe)     5/10 5. ITCHING: Is there any itching? If Yes, ask: How bad is it? (Scale: 1-10; mild, moderate, severe)     No 6. CAUSE: What do you think is causing the discharge? Have you had the same problem before? What happened then?     Unsure  7. OTHER SYMPTOMS: Do you have any other symptoms? (e.g., fever, itching, vaginal bleeding, pain with urination,  injury to genital area, vaginal foreign body)     No  Protocols used: Vaginal Symptoms-A-AH

## 2023-08-20 DIAGNOSIS — Z7985 Long-term (current) use of injectable non-insulin antidiabetic drugs: Secondary | ICD-10-CM | POA: Diagnosis not present

## 2023-08-20 DIAGNOSIS — Z7902 Long term (current) use of antithrombotics/antiplatelets: Secondary | ICD-10-CM | POA: Diagnosis not present

## 2023-08-20 DIAGNOSIS — Z7982 Long term (current) use of aspirin: Secondary | ICD-10-CM | POA: Diagnosis not present

## 2023-08-20 DIAGNOSIS — I69398 Other sequelae of cerebral infarction: Secondary | ICD-10-CM | POA: Diagnosis not present

## 2023-08-20 DIAGNOSIS — E119 Type 2 diabetes mellitus without complications: Secondary | ICD-10-CM | POA: Diagnosis not present

## 2023-08-20 DIAGNOSIS — G43909 Migraine, unspecified, not intractable, without status migrainosus: Secondary | ICD-10-CM | POA: Diagnosis not present

## 2023-08-20 DIAGNOSIS — R531 Weakness: Secondary | ICD-10-CM | POA: Diagnosis not present

## 2023-08-20 DIAGNOSIS — Z79899 Other long term (current) drug therapy: Secondary | ICD-10-CM | POA: Diagnosis not present

## 2023-08-20 DIAGNOSIS — Z7951 Long term (current) use of inhaled steroids: Secondary | ICD-10-CM | POA: Diagnosis not present

## 2023-08-20 DIAGNOSIS — F17210 Nicotine dependence, cigarettes, uncomplicated: Secondary | ICD-10-CM | POA: Diagnosis not present

## 2023-08-22 ENCOUNTER — Emergency Department

## 2023-08-22 ENCOUNTER — Emergency Department
Admission: EM | Admit: 2023-08-22 | Discharge: 2023-08-22 | Disposition: A | Discharge status: Home / Self Care | Attending: Emergency Medicine | Admitting: Emergency Medicine

## 2023-08-22 ENCOUNTER — Other Ambulatory Visit: Payer: Self-pay

## 2023-08-22 DIAGNOSIS — J929 Pleural plaque without asbestos: Secondary | ICD-10-CM | POA: Diagnosis not present

## 2023-08-22 DIAGNOSIS — R079 Chest pain, unspecified: Secondary | ICD-10-CM | POA: Diagnosis not present

## 2023-08-22 DIAGNOSIS — R059 Cough, unspecified: Secondary | ICD-10-CM | POA: Diagnosis not present

## 2023-08-22 DIAGNOSIS — I10 Essential (primary) hypertension: Secondary | ICD-10-CM | POA: Diagnosis not present

## 2023-08-22 DIAGNOSIS — J90 Pleural effusion, not elsewhere classified: Secondary | ICD-10-CM | POA: Diagnosis not present

## 2023-08-22 DIAGNOSIS — J449 Chronic obstructive pulmonary disease, unspecified: Secondary | ICD-10-CM | POA: Diagnosis not present

## 2023-08-22 DIAGNOSIS — G4489 Other headache syndrome: Secondary | ICD-10-CM | POA: Diagnosis not present

## 2023-08-22 DIAGNOSIS — I2510 Atherosclerotic heart disease of native coronary artery without angina pectoris: Secondary | ICD-10-CM | POA: Diagnosis not present

## 2023-08-22 DIAGNOSIS — J189 Pneumonia, unspecified organism: Secondary | ICD-10-CM | POA: Insufficient documentation

## 2023-08-22 DIAGNOSIS — J432 Centrilobular emphysema: Secondary | ICD-10-CM | POA: Diagnosis not present

## 2023-08-22 DIAGNOSIS — R0789 Other chest pain: Secondary | ICD-10-CM | POA: Diagnosis not present

## 2023-08-22 LAB — CBC
HCT: 39.1 % (ref 36.0–46.0)
Hemoglobin: 12.4 g/dL (ref 12.0–15.0)
MCH: 28.1 pg (ref 26.0–34.0)
MCHC: 31.7 g/dL (ref 30.0–36.0)
MCV: 88.7 fL (ref 80.0–100.0)
Platelets: 358 K/uL (ref 150–400)
RBC: 4.41 MIL/uL (ref 3.87–5.11)
RDW: 15.2 % (ref 11.5–15.5)
WBC: 10.4 K/uL (ref 4.0–10.5)
nRBC: 0 % (ref 0.0–0.2)

## 2023-08-22 LAB — COMPREHENSIVE METABOLIC PANEL WITH GFR
ALT: 8 U/L (ref 0–44)
AST: 13 U/L — ABNORMAL LOW (ref 15–41)
Albumin: 3 g/dL — ABNORMAL LOW (ref 3.5–5.0)
Alkaline Phosphatase: 65 U/L (ref 38–126)
Anion gap: 13 (ref 5–15)
BUN: 8 mg/dL (ref 6–20)
CO2: 26 mmol/L (ref 22–32)
Calcium: 8.7 mg/dL — ABNORMAL LOW (ref 8.9–10.3)
Chloride: 100 mmol/L (ref 98–111)
Creatinine, Ser: 0.6 mg/dL (ref 0.44–1.00)
GFR, Estimated: >60 mL/min (ref >60)
Glucose, Bld: 143 mg/dL — ABNORMAL HIGH (ref 70–99)
Potassium: 3.3 mmol/L — ABNORMAL LOW (ref 3.5–5.1)
Sodium: 139 mmol/L (ref 135–145)
Total Bilirubin: 0.5 mg/dL (ref 0.0–1.2)
Total Protein: 6.5 g/dL (ref 6.5–8.1)

## 2023-08-22 LAB — TROPONIN I (HIGH SENSITIVITY): Troponin I (High Sensitivity): 6 ng/L (ref <18)

## 2023-08-22 MED ORDER — SODIUM CHLORIDE 0.9 % IV SOLN
1.0000 g | Freq: Once | INTRAVENOUS | Status: AC
  Administered 2023-08-22: 1 g via INTRAVENOUS
  Filled 2023-08-22: qty 10

## 2023-08-22 MED ORDER — MIDODRINE HCL 5 MG PO TABS
5.0000 mg | ORAL_TABLET | Freq: Three times a day (TID) | ORAL | Status: DC
  Administered 2023-08-22 (×2): 5 mg via ORAL
  Filled 2023-08-22 (×2): qty 1

## 2023-08-22 MED ORDER — ONDANSETRON HCL 4 MG/2ML IJ SOLN
4.0000 mg | Freq: Once | INTRAMUSCULAR | Status: AC
  Administered 2023-08-22: 4 mg via INTRAVENOUS
  Filled 2023-08-22: qty 2

## 2023-08-22 MED ORDER — AZITHROMYCIN 250 MG PO TABS: ORAL_TABLET | ORAL | 0 refills | Status: AC

## 2023-08-22 MED ORDER — GUAIFENESIN-CODEINE 100-10 MG/5ML PO SOLN: 5.0000 mL | Freq: Four times a day (QID) | ORAL | 0 refills | Status: DC | PRN

## 2023-08-22 MED ORDER — AZITHROMYCIN 500 MG PO TABS
500.0000 mg | ORAL_TABLET | Freq: Once | ORAL | Status: AC
  Administered 2023-08-22: 500 mg via ORAL
  Filled 2023-08-22: qty 1

## 2023-08-22 MED ORDER — MIDODRINE HCL 5 MG PO TABS: 5.0000 mg | ORAL_TABLET | Freq: Three times a day (TID) | ORAL | Status: DC

## 2023-08-22 MED ORDER — MORPHINE SULFATE (PF) 4 MG/ML IV SOLN
4.0000 mg | Freq: Once | INTRAVENOUS | Status: AC
  Administered 2023-08-22: 4 mg via INTRAVENOUS
  Filled 2023-08-22: qty 1

## 2023-08-22 MED ORDER — IOHEXOL 350 MG/ML SOLN
75.0000 mL | Freq: Once | INTRAVENOUS | Status: AC | PRN
  Administered 2023-08-22: 75 mL via INTRAVENOUS

## 2023-08-22 NOTE — Discharge Instructions (Addendum)
 Please take antibiotics as prescribed for their entire course.  You may take your cough medication as needed.  Do not take cough medication within 3 hours of your home pain medication as taking them together could be dangerous and cause you to stop breathing.  Please follow-up with your doctor within the next several days for recheck/reevaluation.  Return to the emergency department for any worsening trouble breathing or any other symptom concerning to yourself.

## 2023-08-22 NOTE — ED Triage Notes (Signed)
 BIB [facility] home for CP that started last night around 1000. Pt states worse when breathing deep. 9/10 pain scale. Pt states has had cough for about a week. Non-productive. EMS reports clear lungs bilaterally. Pt also having a recent stroke affecting R side about a month ago. Pt currently taking Keflex  for UTI that was dx 3 days ago over the phone. Pt is current smoker.  103 hr, 98.3 oral 118 cbg,  140/72

## 2023-08-22 NOTE — ED Provider Notes (Addendum)
 [facility] Provider Note    Event Date/Time   First MD Initiated Contact with Patient 08/22/23 [phone]     (approximate)  History   Chief Complaint: Chest Pain  HPI  [name] is a 58 y.o. female with a past medical history of COPD, anxiety, gastric reflux, hypertension, hyperlipidemia, presents to the emergency department for right-sided chest pain.  According to the patient 3 weeks ago she developed pain in the right side of her chest, lasted several days and then resolved.  Patient states she had been chest pain-free until around 10:00 last night when she developed the same pain once again.  Patient describes it as a sharp pain in the right chest that occurs with inspiration.  Patient has a history of a CVA with right-sided deficits that is largely wheelchair-bound, on Plavix  as her only anticoagulation per patient.  No history of DVT or PE previously.  Physical Exam   Triage Vital Signs: ED Triage Vitals [08/22/23 0756]  Encounter Vitals Group     BP      Girls Systolic BP Percentile      Girls Diastolic BP Percentile      Boys Systolic BP Percentile      Boys Diastolic BP Percentile      Pulse      Resp      Temp      Temp src      SpO2 99 %     Weight 209 lb (94.8 kg)     Height 5' 6 (1.676 m)     Head Circumference      Peak Flow      Pain Score 9     Pain Loc      Pain Education      Exclude from Growth Chart     Most recent vital signs: Vitals:   08/22/23 0756  SpO2: 99%    General: Awake, no distress.  CV:  Good peripheral perfusion.  Regular rate and rhythm  Resp:  Normal effort.  Equal breath sounds bilaterally.  No wheeze rales or rhonchi. Abd:  No distention.  Soft, nontender.  No rebound or guarding. Other:  No lower extremity IMA or tenderness.   ED Results / Procedures / Treatments   EKG  EKG viewed and interpreted by myself shows a normal sinus rhythm at 99 bpm with a narrow QRS, normal axis, normal intervals, no  concerning ST changes.  RADIOLOGY  I have reviewed interpret the CT head images.  No pulmonary embolism seen on my evaluation. Radiology is read the CT scan as likely right middle lobe pneumonia   MEDICATIONS ORDERED IN ED: Medications  morphine  (PF) 4 MG/ML injection 4 mg (has no administration in time range)  ondansetron  (ZOFRAN ) injection 4 mg (has no administration in time range)     IMPRESSION / MDM / ASSESSMENT AND PLAN / ED COURSE  I reviewed the triage vital signs and the nursing notes.  Patient's presentation is most consistent with acute presentation with potential threat to life or bodily function.  Patient presents to the emergency department for right sided chest pain described as sharp occurring with inspiration.  Given the patient's pleuritic pain prior CVA with deficits and limited mobility I believe patient will need a CTA of the chest to rule out pulmonary embolism.  We will also check labs including a CBC chemistry and troponin.  EKG shows no significant findings.  Patient is a daily smoker states she has had  a dry cough, discomfort could also be musculoskeletal/chest wall discomfort or pleurisy.  Patient agreeable to workup.  Will treat pain while waiting for the results.  CT consistent with right middle lobe pneumonia.  Patient's lab work is reassuring with a normal CBC with a normal white blood cell count.  Reassuring chemistry, negative troponin.  Patient's vital signs continue to be reassuring satting 96%.  Patient does take midodrine  for her blood pressure which she received her normal morning dose in the emergency department.  We will dose IV antibiotics while the patient is in the emergency department and discharged on Keflex  and Zithromax  as well as a short course of pain medication and have the patient follow-up with her doctor.  Patient agreeable to plan of care.  Provided my typical pneumonia return precautions.  Patient had previously been started on Keflex   08/19/2023.  We will just add azithromycin  as well as codeine -based cough medication if needed.  FINAL CLINICAL IMPRESSION(S) / ED DIAGNOSES   Chest pain Community-acquired pneumonia  Note:  This document was prepared using Dragon voice recognition software and may include unintentional dictation errors.   Dorothyann Drivers, MD 08/22/23 1125    Dorothyann Drivers, MD 08/22/23 1128

## 2023-08-24 ENCOUNTER — Telehealth: Payer: Self-pay

## 2023-08-24 ENCOUNTER — Emergency Department
Admission: EM | Admit: 2023-08-24 | Discharge: 2023-08-24 | Discharge status: Against Medical Advice | Attending: Emergency Medicine | Admitting: Emergency Medicine

## 2023-08-24 ENCOUNTER — Ambulatory Visit: Payer: Self-pay

## 2023-08-24 ENCOUNTER — Other Ambulatory Visit: Payer: Self-pay

## 2023-08-24 ENCOUNTER — Emergency Department

## 2023-08-24 DIAGNOSIS — Z5321 Procedure and treatment not carried out due to patient leaving prior to being seen by health care provider: Secondary | ICD-10-CM | POA: Diagnosis not present

## 2023-08-24 DIAGNOSIS — R059 Cough, unspecified: Secondary | ICD-10-CM | POA: Diagnosis not present

## 2023-08-24 DIAGNOSIS — R079 Chest pain, unspecified: Secondary | ICD-10-CM | POA: Insufficient documentation

## 2023-08-24 LAB — BASIC METABOLIC PANEL WITH GFR
Anion gap: 12 (ref 5–15)
BUN: 6 mg/dL (ref 6–20)
CO2: 22 mmol/L (ref 22–32)
Calcium: 8.6 mg/dL — ABNORMAL LOW (ref 8.9–10.3)
Chloride: 102 mmol/L (ref 98–111)
Creatinine, Ser: 0.52 mg/dL (ref 0.44–1.00)
GFR, Estimated: >60 mL/min (ref >60)
Glucose, Bld: 141 mg/dL — ABNORMAL HIGH (ref 70–99)
Potassium: 3.2 mmol/L — ABNORMAL LOW (ref 3.5–5.1)
Sodium: 136 mmol/L (ref 135–145)

## 2023-08-24 LAB — CBC
HCT: 39.5 % (ref 36.0–46.0)
Hemoglobin: 12.6 g/dL (ref 12.0–15.0)
MCH: 27.9 pg (ref 26.0–34.0)
MCHC: 31.9 g/dL (ref 30.0–36.0)
MCV: 87.6 fL (ref 80.0–100.0)
Platelets: 380 K/uL (ref 150–400)
RBC: 4.51 MIL/uL (ref 3.87–5.11)
RDW: 15.1 % (ref 11.5–15.5)
WBC: 8.8 K/uL (ref 4.0–10.5)
nRBC: 0 % (ref 0.0–0.2)

## 2023-08-24 LAB — TROPONIN I (HIGH SENSITIVITY)
Troponin I (High Sensitivity): 5 ng/L (ref <18)
Troponin I (High Sensitivity): 5 ng/L (ref <18)

## 2023-08-24 NOTE — Telephone Encounter (Signed)
 I spoke with patient, she has been seen in the ED. Diagnosed with pneumonia. She called stating she was having right sided chest pain. I advised her to go back to the ED. She left this morning without being seen after being taken by EMS. I offered her an appointment for Wednesday she stated she could not do that. Made an appointment for Thursday.

## 2023-08-24 NOTE — Telephone Encounter (Signed)
 Patient refused 911/ED recommendation x multiple attempts, this RN provided rationales for recommendation.   This RN contacted Nickie at the PCP office and provided information and warm hand off of patient was completed.  Reason for Disposition  [1] Chest pain lasts > 5 minutes AND [2] age > 30 AND [3] one or more cardiac risk factors (e.g., diabetes, high blood pressure, high cholesterol, obesity with BMI 30 or higher, smoker, or strong family history of heart disease)  Answer Assessment - Initial Assessment Questions Dx with pneumonia on 8/9. Went to ED today but left AMA.   1. LOCATION: Where does it hurt?       Right chest  2. RADIATION: Does the pain go anywhere else? (e.g., into neck, jaw, arms, back)     Denies  3. ONSET: When did the chest pain begin? (Minutes, hours or days)      Initially seen on 08/22/23 in ED for pneumonia and again this morning for chest pain and SOB  4. PATTERN: Does the pain come and go, or has it been constant since it started?  Does it get worse with exertion?      Occurs with deep breath  7. CARDIAC RISK FACTORS: Do you have any history of heart problems or risk factors for heart disease? (e.g., angina, prior heart attack; diabetes, high blood pressure, high cholesterol, smoker, or strong family history of heart disease)     HTN, CVA  8. PULMONARY RISK FACTORS: Do you have any history of lung disease?  (e.g., blood clots in lung, asthma, emphysema, birth control pills)     COPD  Protocols used: Chest Pain-A-AH Copied from CRM #[id number]. Topic: Clinical - Red Word Triage >> Aug 24, 2023  3:43 PM Emylou G wrote: [facility] that prompted transfer to Nurse Triage: chest pain

## 2023-08-24 NOTE — ED Triage Notes (Signed)
 Arrives from home via [facility]  Bed bound at home, uses hoyer lift and power chair.  C/O chest pain.  Onset one hour PTA. Woke patient from sleep.  324 ASA given. Also being treated for pneumonia currently, taking antibiotics.  Pain worse with coughing.   VS wnl.  12 lead WNL

## 2023-08-24 NOTE — ED Triage Notes (Signed)
 Pt comes via [facility] from home with c/o cp. Pt is bebound. Pt states pain started this morning when she woke up. Pt states mid sternal pain.

## 2023-08-24 NOTE — Telephone Encounter (Signed)
 Thank you. Yes I agree with the triage and plan. I will see her later this week as scheduled.  Marsa Officer, DO [facility] 08/24/2023, 5:14 PM

## 2023-08-24 NOTE — Telephone Encounter (Signed)
 Reviewed this case and will see patient at upcoming follow-up apt this week  Marsa Officer, DO [facility] 08/24/2023, 5:13 PM

## 2023-08-25 ENCOUNTER — Ambulatory Visit: Payer: Self-pay

## 2023-08-25 ENCOUNTER — Other Ambulatory Visit: Payer: Self-pay | Admitting: Family Medicine

## 2023-08-25 DIAGNOSIS — F331 Major depressive disorder, recurrent, moderate: Secondary | ICD-10-CM

## 2023-08-25 DIAGNOSIS — J432 Centrilobular emphysema: Secondary | ICD-10-CM

## 2023-08-25 DIAGNOSIS — F5104 Psychophysiologic insomnia: Secondary | ICD-10-CM

## 2023-08-25 NOTE — Telephone Encounter (Signed)
 FYI - agreed, we will discuss labs at next visit upcoming this week. Her recent ED visit was not an actual visit, she had labs in triage and then not seen by provider. We will review the outside labs at next visit.  Marsa Officer, DO [facility] 08/25/2023, 5:18 PM

## 2023-08-25 NOTE — Telephone Encounter (Signed)
 Pt would like information about her abnormal labs from yesterday and 8/9, labs have not been resulted/synopsized by a provider.   Copied from CRM [id number]. Topic: Clinical - Red Word Triage >> Aug 25, 2023  3:28 PM Tiffini S wrote: Red Word that prompted transfer to Nurse Triage: Patient called stating that she is not feeling better from the ER- she have Pneumonia and have left chest pain

## 2023-08-26 ENCOUNTER — Encounter: Payer: Self-pay | Admitting: Family Medicine

## 2023-08-27 ENCOUNTER — Ambulatory Visit: Admitting: Family Medicine

## 2023-08-27 DIAGNOSIS — I69398 Other sequelae of cerebral infarction: Secondary | ICD-10-CM | POA: Diagnosis not present

## 2023-08-27 DIAGNOSIS — Z7982 Long term (current) use of aspirin: Secondary | ICD-10-CM | POA: Diagnosis not present

## 2023-08-27 DIAGNOSIS — Z7985 Long-term (current) use of injectable non-insulin antidiabetic drugs: Secondary | ICD-10-CM | POA: Diagnosis not present

## 2023-08-27 DIAGNOSIS — E119 Type 2 diabetes mellitus without complications: Secondary | ICD-10-CM | POA: Diagnosis not present

## 2023-08-27 DIAGNOSIS — Z7951 Long term (current) use of inhaled steroids: Secondary | ICD-10-CM | POA: Diagnosis not present

## 2023-08-27 DIAGNOSIS — G43909 Migraine, unspecified, not intractable, without status migrainosus: Secondary | ICD-10-CM | POA: Diagnosis not present

## 2023-08-27 DIAGNOSIS — Z79899 Other long term (current) drug therapy: Secondary | ICD-10-CM | POA: Diagnosis not present

## 2023-08-27 DIAGNOSIS — Z7902 Long term (current) use of antithrombotics/antiplatelets: Secondary | ICD-10-CM | POA: Diagnosis not present

## 2023-08-27 DIAGNOSIS — F17210 Nicotine dependence, cigarettes, uncomplicated: Secondary | ICD-10-CM | POA: Diagnosis not present

## 2023-08-27 DIAGNOSIS — R531 Weakness: Secondary | ICD-10-CM | POA: Diagnosis not present

## 2023-08-28 DIAGNOSIS — G43909 Migraine, unspecified, not intractable, without status migrainosus: Secondary | ICD-10-CM | POA: Diagnosis not present

## 2023-08-28 DIAGNOSIS — F17210 Nicotine dependence, cigarettes, uncomplicated: Secondary | ICD-10-CM | POA: Diagnosis not present

## 2023-08-28 DIAGNOSIS — Z7902 Long term (current) use of antithrombotics/antiplatelets: Secondary | ICD-10-CM | POA: Diagnosis not present

## 2023-08-28 DIAGNOSIS — Z7985 Long-term (current) use of injectable non-insulin antidiabetic drugs: Secondary | ICD-10-CM | POA: Diagnosis not present

## 2023-08-28 DIAGNOSIS — Z7951 Long term (current) use of inhaled steroids: Secondary | ICD-10-CM | POA: Diagnosis not present

## 2023-08-28 DIAGNOSIS — Z7982 Long term (current) use of aspirin: Secondary | ICD-10-CM | POA: Diagnosis not present

## 2023-08-28 DIAGNOSIS — Z79899 Other long term (current) drug therapy: Secondary | ICD-10-CM | POA: Diagnosis not present

## 2023-08-28 DIAGNOSIS — I69398 Other sequelae of cerebral infarction: Secondary | ICD-10-CM | POA: Diagnosis not present

## 2023-08-28 DIAGNOSIS — R531 Weakness: Secondary | ICD-10-CM | POA: Diagnosis not present

## 2023-08-28 DIAGNOSIS — E119 Type 2 diabetes mellitus without complications: Secondary | ICD-10-CM | POA: Diagnosis not present

## 2023-08-28 NOTE — Telephone Encounter (Signed)
 Requested Prescriptions  Pending Prescriptions Disp Refills   QUEtiapine  (SEROQUEL ) 100 MG tablet [Pharmacy Med Name: QUEtiapine  Fumarate 100 MG Oral Tablet] 100 tablet 2    Sig: TAKE 1 TABLET BY MOUTH DAILY AT  9 PM AT BEDTIME     Not Delegated - Psychiatry:  Antipsychotics - Second Generation (Atypical) - quetiapine  Failed - 08/28/2023 12:35 PM      Failed - This refill cannot be delegated      Failed - Lipid Panel in normal range within the last 12 months    Cholesterol  Date Value Ref Range Status  06/03/2023 137 0 - 200 mg/dL Final  90/74/7985 792 (H) 0 - 200 mg/dL Final   Ldl Cholesterol, Calc  Date Value Ref Range Status  10/07/2012 127 (H) 0 - 100 mg/dL Final   LDL Cholesterol  Date Value Ref Range Status  06/03/2023 71 0 - 99 mg/dL Final    Comment:           Total Cholesterol/HDL:CHD Risk Coronary Heart Disease Risk Table                     Men   Women  1/2 Average Risk   3.4   3.3  Average Risk       5.0   4.4  2 X Average Risk   9.6   7.1  3 X Average Risk  23.4   11.0        Use the calculated Patient Ratio above and the CHD Risk Table to determine the patient's CHD Risk.        ATP III CLASSIFICATION (LDL):  <100     mg/dL   Optimal  899-870  mg/dL   Near or Above                    Optimal  130-159  mg/dL   Borderline  839-810  mg/dL   High  >809     mg/dL   Very High Performed at [facility], [address] [place], [place] [address]    HDL Cholesterol  Date Value Ref Range Status  10/07/2012 25 (L) 40 - 60 mg/dL Final   HDL  Date Value Ref Range Status  06/03/2023 31 (L) >40 mg/dL Final   Triglycerides  Date Value Ref Range Status  06/03/2023 177 (H) <150 mg/dL Final  90/74/7985 726 (H) 0 - 200 mg/dL Final         Failed - CMP within normal limits and completed in the last 12 months    Albumin  Date Value Ref Range Status  08/22/2023 3.0 (L) 3.5 - 5.0 g/dL Final  90/89/7984 2.6 (L) 3.4 - 5.0 g/dL Final   Alkaline  Phosphatase  Date Value Ref Range Status  08/22/2023 65 38 - 126 U/L Final  09/22/2013 78 Unit/L Final    Comment:    46-116 NOTE: New Reference Range 08/02/13    Alkaline phosphatase (APISO)  Date Value Ref Range Status  07/03/2023 79 37 - 153 U/L Final   ALT  Date Value Ref Range Status  08/22/2023 8 0 - 44 U/L Final   SGPT (ALT)  Date Value Ref Range Status  09/22/2013 16 U/L Final    Comment:    14-63 NOTE: New Reference Range 08/02/13    AST  Date Value Ref Range Status  08/22/2023 13 (L) 15 - 41 U/L Final   SGOT(AST)  Date Value Ref Range Status  09/22/2013  17 15 - 37 Unit/L Final   BUN  Date Value Ref Range Status  08/24/2023 6 6 - 20 mg/dL Final  90/89/7984 11 7 - 18 mg/dL Final   Calcium   Date Value Ref Range Status  08/24/2023 8.6 (L) 8.9 - 10.3 mg/dL Final   Calcium , Total  Date Value Ref Range Status  09/22/2013 8.3 (L) 8.5 - 10.1 mg/dL Final   Calcium , Ion  Date Value Ref Range Status  04/07/2017 1.17 1.15 - 1.40 mmol/L Final   CO2  Date Value Ref Range Status  08/24/2023 22 22 - 32 mmol/L Final   Co2  Date Value Ref Range Status  09/22/2013 27 21 - 32 mmol/L Final   Bicarbonate  Date Value Ref Range Status  12/08/2021 24.6 20.0 - 28.0 mmol/L Final   TCO2  Date Value Ref Range Status  04/07/2017 29 22 - 32 mmol/L Final   Creat  Date Value Ref Range Status  07/03/2023 0.61 0.50 - 1.03 mg/dL Final   Creatinine, Ser  Date Value Ref Range Status  08/24/2023 0.52 0.44 - 1.00 mg/dL Final   Glucose  Date Value Ref Range Status  09/22/2013 335 (H) 65 - 99 mg/dL Final   Glucose, Bld  Date Value Ref Range Status  08/24/2023 141 (H) 70 - 99 mg/dL Final    Comment:    Glucose reference range applies only to samples taken after fasting for at least 8 hours.   Glucose-Capillary  Date Value Ref Range Status  06/02/2023 115 (H) 70 - 99 mg/dL Final    Comment:    Glucose reference range applies only to samples taken after fasting  for at least 8 hours.   Potassium  Date Value Ref Range Status  08/24/2023 3.2 (L) 3.5 - 5.1 mmol/L Final  09/22/2013 3.8 3.5 - 5.1 mmol/L Final   Sodium  Date Value Ref Range Status  08/24/2023 136 135 - 145 mmol/L Final  09/22/2013 135 (L) 136 - 145 mmol/L Final   Total Bilirubin  Date Value Ref Range Status  08/22/2023 0.5 0.0 - 1.2 mg/dL Final   Bilirubin,Total  Date Value Ref Range Status  09/22/2013 0.2 0.2 - 1.0 mg/dL Final   Bilirubin, Direct  Date Value Ref Range Status  08/28/2012 < 0.1 0.00 - 0.20 mg/dL Final   Protein, ur  Date Value Ref Range Status  12/08/2021 30 (A) NEGATIVE mg/dL Final   Protein,UA  Date Value Ref Range Status  09/08/2022 Negative Negative/Trace Final   Total Protein  Date Value Ref Range Status  08/22/2023 6.5 6.5 - 8.1 g/dL Final  90/89/7984 7.2 6.4 - 8.2 g/dL Final   EGFR (African American)  Date Value Ref Range Status  09/22/2013 >60  Final   GFR calc Af Amer  Date Value Ref Range Status  08/18/2017 >60 >60 mL/min Final    Comment:    (NOTE) The eGFR has been calculated using the CKD EPI equation. This calculation has not been validated in all clinical situations. eGFR's persistently <60 mL/min signify possible Chronic Kidney Disease.    GFR  Date Value Ref Range Status  09/23/2017 91.12 >60.00 mL/min Final   eGFR  Date Value Ref Range Status  07/03/2023 103 > OR = 60 mL/min/1.39m2 Final   EGFR (Non-African Amer.)  Date Value Ref Range Status  09/22/2013 >60  Final    Comment:    eGFR values <65mL/min/1.73 m2 may be an indication of chronic kidney disease (CKD). Calculated eGFR is useful in patients with  stable renal function. The eGFR calculation will not be reliable in acutely ill patients when serum creatinine is changing rapidly. It is not useful in  patients on dialysis. The eGFR calculation may not be applicable to patients at the low and high extremes of body sizes, pregnant women, and vegetarians.     GFR, Estimated  Date Value Ref Range Status  08/24/2023 >60 >60 mL/min Final    Comment:    (NOTE) Calculated using the CKD-EPI Creatinine Equation (2021)          Passed - TSH in normal range and within 360 days    TSH  Date Value Ref Range Status  06/02/2023 2.888 0.350 - 4.500 uIU/mL Final    Comment:    Performed by a 3rd Generation assay with a functional sensitivity of <=0.01 uIU/mL. Performed at [facility], [address] [place], [place] [address]          Passed - Completed PHQ-2 or PHQ-9 in the last 360 days      Passed - Last BP in normal range    BP Readings from Last 1 Encounters:  08/22/23 104/69         Passed - Last Heart Rate in normal range    Pulse Readings from Last 1 Encounters:  08/22/23 86         Passed - Valid encounter within last 6 months    Recent Outpatient Visits           2 months ago Cryptogenic stroke [facility])   [place] [facility] [place], Marsa PARAS, DO   5 months ago Acute conjunctivitis of both eyes, unspecified acute conjunctivitis type   [facility] [place], Marsa PARAS, DO              Passed - CBC within normal limits and completed in the last 12 months    WBC  Date Value Ref Range Status  08/24/2023 8.8 4.0 - 10.5 K/uL Final   RBC  Date Value Ref Range Status  08/24/2023 4.51 3.87 - 5.11 MIL/uL Final   Hemoglobin  Date Value Ref Range Status  08/24/2023 12.6 12.0 - 15.0 g/dL Final   HGB  Date Value Ref Range Status  09/22/2013 14.6 12.0 - 16.0 g/dL Final   HCT  Date Value Ref Range Status  08/24/2023 39.5 36.0 - 46.0 % Final  09/22/2013 44.3 35.0 - 47.0 % Final   MCHC  Date Value Ref Range Status  08/24/2023 31.9 30.0 - 36.0 g/dL Final   [facility]  Date Value Ref Range Status  08/24/2023 27.9 26.0 - 34.0 pg Final   MCV  Date Value Ref Range Status  08/24/2023 87.6 80.0 - 100.0 fL Final  09/22/2013 90 80 - 100 fL Final   No results  found for: PLTCOUNTKUC, LABPLAT, POCPLA RDW  Date Value Ref Range Status  08/24/2023 15.1 11.5 - 15.5 % Final  09/22/2013 13.8 11.5 - 14.5 % Final          albuterol  (VENTOLIN  HFA) 108 (90 Base) MCG/ACT inhaler [Pharmacy Med Name: ALBUTEROL  HFA 90MCG/ACT (PA)] 51 g 1    Sig: INHALE 2 INHALATIONS BY MOUTH  EVERY 4 HOURS AS NEEDED FOR  WHEEZING OR FOR SHORTNESS OF  BREATH     Pulmonology:  Beta Agonists 2 Passed - 08/28/2023 12:35 PM      Passed - Last BP in normal range    BP Readings from Last 1 Encounters:  08/22/23 104/69         Passed - Last Heart Rate in normal range    Pulse Readings from Last 1 Encounters:  08/22/23 86         Passed - Valid encounter within last 12 months    Recent Outpatient Visits           2 months ago Cryptogenic stroke [facility])   [place] [facility] [place], Marsa PARAS, DO   5 months ago Acute conjunctivitis of both eyes, unspecified acute conjunctivitis type   [place] [facility] [place], Marsa PARAS, DO               citalopram  (CELEXA ) 40 MG tablet [Pharmacy Med Name: Citalopram  Hydrobromide 40 MG Oral Tablet] 100 tablet 1    Sig: TAKE 1 TABLET BY MOUTH DAILY     Psychiatry:  Antidepressants - SSRI Passed - 08/28/2023 12:35 PM      Passed - Completed PHQ-2 or PHQ-9 in the last 360 days      Passed - Valid encounter within last 6 months    Recent Outpatient Visits           2 months ago Cryptogenic stroke [facility])   [place] [facility] [place], Marsa PARAS, DO   5 months ago Acute conjunctivitis of both eyes, unspecified acute conjunctivitis type   [facility] [place], Marsa PARAS, [place]

## 2023-08-28 NOTE — Telephone Encounter (Signed)
 Requested medication (s) are due for refill today: Yes  Requested medication (s) are on the active medication list: Yes  Last refill:  06/15/23  Future visit scheduled: Yes  Notes to clinic:  Not delegated.    Requested Prescriptions  Pending Prescriptions Disp Refills   QUEtiapine  (SEROQUEL ) 100 MG tablet [Pharmacy Med Name: QUEtiapine  Fumarate 100 MG Oral Tablet] 100 tablet 2    Sig: TAKE 1 TABLET BY MOUTH DAILY AT  9 PM AT BEDTIME     Not Delegated - Psychiatry:  Antipsychotics - Second Generation (Atypical) - quetiapine  Failed - 08/28/2023 12:36 PM      Failed - This refill cannot be delegated      Failed - Lipid Panel in normal range within the last 12 months    Cholesterol  Date Value Ref Range Status  06/03/2023 137 0 - 200 mg/dL Final  90/74/7985 792 (H) 0 - 200 mg/dL Final   Ldl Cholesterol, Calc  Date Value Ref Range Status  10/07/2012 127 (H) 0 - 100 mg/dL Final   LDL Cholesterol  Date Value Ref Range Status  06/03/2023 71 0 - 99 mg/dL Final    Comment:           Total Cholesterol/HDL:CHD Risk Coronary Heart Disease Risk Table                     Men   Women  1/2 Average Risk   3.4   3.3  Average Risk       5.0   4.4  2 X Average Risk   9.6   7.1  3 X Average Risk  23.4   11.0        Use the calculated Patient Ratio above and the CHD Risk Table to determine the patient's CHD Risk.        ATP III CLASSIFICATION (LDL):  <100     mg/dL   Optimal  899-870  mg/dL   Near or Above                    Optimal  130-159  mg/dL   Borderline  839-810  mg/dL   High  >809     mg/dL   Very High Performed at [facility], [address] [place], [place] [address]    HDL Cholesterol  Date Value Ref Range Status  10/07/2012 25 (L) 40 - 60 mg/dL Final   HDL  Date Value Ref Range Status  06/03/2023 31 (L) >40 mg/dL Final   Triglycerides  Date Value Ref Range Status  06/03/2023 177 (H) <150 mg/dL Final  90/74/7985 726 (H) 0 - 200 mg/dL Final          Failed - CMP within normal limits and completed in the last 12 months    Albumin  Date Value Ref Range Status  08/22/2023 3.0 (L) 3.5 - 5.0 g/dL Final  90/89/7984 2.6 (L) 3.4 - 5.0 g/dL Final   Alkaline Phosphatase  Date Value Ref Range Status  08/22/2023 65 38 - 126 U/L Final  09/22/2013 78 Unit/L Final    Comment:    46-116 NOTE: New Reference Range 08/02/13    Alkaline phosphatase (APISO)  Date Value Ref Range Status  07/03/2023 79 37 - 153 U/L Final   ALT  Date Value Ref Range Status  08/22/2023 8 0 - 44 U/L Final   SGPT (ALT)  Date Value Ref Range Status  09/22/2013 16 U/L Final  Comment:    14-63 NOTE: New Reference Range 08/02/13    AST  Date Value Ref Range Status  08/22/2023 13 (L) 15 - 41 U/L Final   SGOT(AST)  Date Value Ref Range Status  09/22/2013 17 15 - 37 Unit/L Final   BUN  Date Value Ref Range Status  08/24/2023 6 6 - 20 mg/dL Final  90/89/7984 11 7 - 18 mg/dL Final   Calcium   Date Value Ref Range Status  08/24/2023 8.6 (L) 8.9 - 10.3 mg/dL Final   Calcium , Total  Date Value Ref Range Status  09/22/2013 8.3 (L) 8.5 - 10.1 mg/dL Final   Calcium , Ion  Date Value Ref Range Status  04/07/2017 1.17 1.15 - 1.40 mmol/L Final   CO2  Date Value Ref Range Status  08/24/2023 22 22 - 32 mmol/L Final   Co2  Date Value Ref Range Status  09/22/2013 27 21 - 32 mmol/L Final   Bicarbonate  Date Value Ref Range Status  12/08/2021 24.6 20.0 - 28.0 mmol/L Final   TCO2  Date Value Ref Range Status  04/07/2017 29 22 - 32 mmol/L Final   Creat  Date Value Ref Range Status  07/03/2023 0.61 0.50 - 1.03 mg/dL Final   Creatinine, Ser  Date Value Ref Range Status  08/24/2023 0.52 0.44 - 1.00 mg/dL Final   Glucose  Date Value Ref Range Status  09/22/2013 335 (H) 65 - 99 mg/dL Final   Glucose, Bld  Date Value Ref Range Status  08/24/2023 141 (H) 70 - 99 mg/dL Final    Comment:    Glucose reference range applies only to samples taken  after fasting for at least 8 hours.   Glucose-Capillary  Date Value Ref Range Status  06/02/2023 115 (H) 70 - 99 mg/dL Final    Comment:    Glucose reference range applies only to samples taken after fasting for at least 8 hours.   Potassium  Date Value Ref Range Status  08/24/2023 3.2 (L) 3.5 - 5.1 mmol/L Final  09/22/2013 3.8 3.5 - 5.1 mmol/L Final   Sodium  Date Value Ref Range Status  08/24/2023 136 135 - 145 mmol/L Final  09/22/2013 135 (L) 136 - 145 mmol/L Final   Total Bilirubin  Date Value Ref Range Status  08/22/2023 0.5 0.0 - 1.2 mg/dL Final   Bilirubin,Total  Date Value Ref Range Status  09/22/2013 0.2 0.2 - 1.0 mg/dL Final   Bilirubin, Direct  Date Value Ref Range Status  08/28/2012 < 0.1 0.00 - 0.20 mg/dL Final   Protein, ur  Date Value Ref Range Status  12/08/2021 30 (A) NEGATIVE mg/dL Final   Protein,UA  Date Value Ref Range Status  09/08/2022 Negative Negative/Trace Final   Total Protein  Date Value Ref Range Status  08/22/2023 6.5 6.5 - 8.1 g/dL Final  90/89/7984 7.2 6.4 - 8.2 g/dL Final   EGFR (African American)  Date Value Ref Range Status  09/22/2013 >60  Final   GFR calc Af Amer  Date Value Ref Range Status  08/18/2017 >60 >60 mL/min Final    Comment:    (NOTE) The eGFR has been calculated using the CKD EPI equation. This calculation has not been validated in all clinical situations. eGFR's persistently <60 mL/min signify possible Chronic Kidney Disease.    GFR  Date Value Ref Range Status  09/23/2017 91.12 >60.00 mL/min Final   eGFR  Date Value Ref Range Status  07/03/2023 103 > OR = 60 mL/min/1.29m2 Final   EGFR (  Suzann Horning.)  Date Value Ref Range Status  09/22/2013 >60  Final    Comment:    eGFR values <73mL/min/1.73 m2 may be an indication of chronic kidney disease (CKD). Calculated eGFR is useful in patients with stable renal function. The eGFR calculation will not be reliable in acutely ill patients when serum  creatinine is changing rapidly. It is not useful in  patients on dialysis. The eGFR calculation may not be applicable to patients at the low and high extremes of body sizes, pregnant women, and vegetarians.    GFR, Estimated  Date Value Ref Range Status  08/24/2023 >60 >60 mL/min Final    Comment:    (NOTE) Calculated using the CKD-EPI Creatinine Equation (2021)          Passed - TSH in normal range and within 360 days    TSH  Date Value Ref Range Status  06/02/2023 2.888 0.350 - 4.500 uIU/mL Final    Comment:    Performed by a 3rd Generation assay with a functional sensitivity of <=0.01 uIU/mL. Performed at [facility], [address] [place], [place] [address]          Passed - Completed PHQ-2 or PHQ-9 in the last 360 days      Passed - Last BP in normal range    BP Readings from Last 1 Encounters:  08/22/23 104/69         Passed - Last Heart Rate in normal range    Pulse Readings from Last 1 Encounters:  08/22/23 86         Passed - Valid encounter within last 6 months    Recent Outpatient Visits           2 months ago Cryptogenic stroke [facility])   [facility] [facility] [place], Marsa PARAS, DO   5 months ago Acute conjunctivitis of both eyes, unspecified acute conjunctivitis type   [facility] [place], Marsa PARAS, DO              Passed - CBC within normal limits and completed in the last 12 months    WBC  Date Value Ref Range Status  08/24/2023 8.8 4.0 - 10.5 K/uL Final   RBC  Date Value Ref Range Status  08/24/2023 4.51 3.87 - 5.11 MIL/uL Final   Hemoglobin  Date Value Ref Range Status  08/24/2023 12.6 12.0 - 15.0 g/dL Final   HGB  Date Value Ref Range Status  09/22/2013 14.6 12.0 - 16.0 g/dL Final   HCT  Date Value Ref Range Status  08/24/2023 39.5 36.0 - 46.0 % Final  09/22/2013 44.3 35.0 - 47.0 % Final   MCHC  Date Value Ref Range Status  08/24/2023 31.9 30.0 - 36.0  g/dL Final   [facility]  Date Value Ref Range Status  08/24/2023 27.9 26.0 - 34.0 pg Final   MCV  Date Value Ref Range Status  08/24/2023 87.6 80.0 - 100.0 fL Final  09/22/2013 90 80 - 100 fL Final   No results found for: PLTCOUNTKUC, LABPLAT, POCPLA RDW  Date Value Ref Range Status  08/24/2023 15.1 11.5 - 15.5 % Final  09/22/2013 13.8 11.5 - 14.5 % Final         Signed Prescriptions Disp Refills   albuterol  (VENTOLIN  HFA) 108 (90 Base) MCG/ACT inhaler 51 g 1    Sig: INHALE 2 INHALATIONS BY MOUTH  EVERY 4 HOURS AS NEEDED FOR  WHEEZING OR FOR SHORTNESS OF  BREATH     Pulmonology:  Beta Agonists 2 Passed - 08/28/2023 12:36 PM      Passed - Last BP in normal range    BP Readings from Last 1 Encounters:  08/22/23 104/69         Passed - Last Heart Rate in normal range    Pulse Readings from Last 1 Encounters:  08/22/23 86         Passed - Valid encounter within last 12 months    Recent Outpatient Visits           2 months ago Cryptogenic stroke [facility])   [place] [facility] [place], Marsa PARAS, DO   5 months ago Acute conjunctivitis of both eyes, unspecified acute conjunctivitis type   [facility], Marsa PARAS, DO               citalopram  (CELEXA ) 40 MG tablet 100 tablet 1    Sig: TAKE 1 TABLET BY MOUTH DAILY     Psychiatry:  Antidepressants - SSRI Passed - 08/28/2023 12:36 PM      Passed - Completed PHQ-2 or PHQ-9 in the last 360 days      Passed - Valid encounter within last 6 months    Recent Outpatient Visits           2 months ago Cryptogenic stroke [facility])   [place] [facility] [place], Marsa PARAS, DO   5 months ago Acute conjunctivitis of both eyes, unspecified acute conjunctivitis type   [facility] [place], Marsa PARAS, [place]

## 2023-08-31 NOTE — Telephone Encounter (Signed)
-----   Message from Nurse Mliss T sent at 07/27/2023  8:06 AM EDT ----- Please call and get patient scheduled with Suzann - thank you!  ----- Message ----- From: Riddle, Suzann, NP Sent: 07/26/2023   2:45 PM EDT To: Mliss Hummer, LPN  No afib on her ambulatory monitor.  Recommend appointment with me to discuss implantble loop recorder. ----- Message ----- From: Kennyth Chew, MD Sent: 07/25/2023   2:36 PM EDT To: Suzann Riddle, NP

## 2023-08-31 NOTE — Telephone Encounter (Signed)
 LVM to schedule appointment with Suzann Riddle, NP

## 2023-09-02 DIAGNOSIS — E785 Hyperlipidemia, unspecified: Secondary | ICD-10-CM | POA: Diagnosis not present

## 2023-09-02 DIAGNOSIS — Z79899 Other long term (current) drug therapy: Secondary | ICD-10-CM | POA: Diagnosis not present

## 2023-09-02 DIAGNOSIS — J449 Chronic obstructive pulmonary disease, unspecified: Secondary | ICD-10-CM | POA: Diagnosis not present

## 2023-09-02 DIAGNOSIS — K219 Gastro-esophageal reflux disease without esophagitis: Secondary | ICD-10-CM | POA: Diagnosis not present

## 2023-09-02 DIAGNOSIS — E119 Type 2 diabetes mellitus without complications: Secondary | ICD-10-CM | POA: Diagnosis not present

## 2023-09-02 DIAGNOSIS — Z7985 Long-term (current) use of injectable non-insulin antidiabetic drugs: Secondary | ICD-10-CM | POA: Diagnosis not present

## 2023-09-02 DIAGNOSIS — Z7951 Long term (current) use of inhaled steroids: Secondary | ICD-10-CM | POA: Diagnosis not present

## 2023-09-02 DIAGNOSIS — I10 Essential (primary) hypertension: Secondary | ICD-10-CM | POA: Diagnosis not present

## 2023-09-02 DIAGNOSIS — I69398 Other sequelae of cerebral infarction: Secondary | ICD-10-CM | POA: Diagnosis not present

## 2023-09-02 DIAGNOSIS — F17210 Nicotine dependence, cigarettes, uncomplicated: Secondary | ICD-10-CM | POA: Diagnosis not present

## 2023-09-02 DIAGNOSIS — Z7982 Long term (current) use of aspirin: Secondary | ICD-10-CM | POA: Diagnosis not present

## 2023-09-02 DIAGNOSIS — R531 Weakness: Secondary | ICD-10-CM | POA: Diagnosis not present

## 2023-09-02 DIAGNOSIS — Z7902 Long term (current) use of antithrombotics/antiplatelets: Secondary | ICD-10-CM | POA: Diagnosis not present

## 2023-09-02 DIAGNOSIS — G43909 Migraine, unspecified, not intractable, without status migrainosus: Secondary | ICD-10-CM | POA: Diagnosis not present

## 2023-09-02 NOTE — Telephone Encounter (Signed)
-----   Message from Nurse Mliss T sent at 07/27/2023  8:06 AM EDT ----- Please call and get patient scheduled with Suzann - thank you!  ----- Message ----- From: Riddle, Suzann, NP Sent: 07/26/2023   2:45 PM EDT To: Mliss Hummer, LPN  No afib on her ambulatory monitor.  Recommend appointment with me to discuss implantble loop recorder. ----- Message ----- From: Kennyth Chew, MD Sent: 07/25/2023   2:36 PM EDT To: Suzann Riddle, NP

## 2023-09-03 ENCOUNTER — Encounter

## 2023-09-03 ENCOUNTER — Telehealth: Admitting: Physician Assistant

## 2023-09-03 DIAGNOSIS — G43909 Migraine, unspecified, not intractable, without status migrainosus: Secondary | ICD-10-CM | POA: Diagnosis not present

## 2023-09-03 DIAGNOSIS — N390 Urinary tract infection, site not specified: Secondary | ICD-10-CM

## 2023-09-03 DIAGNOSIS — E785 Hyperlipidemia, unspecified: Secondary | ICD-10-CM | POA: Diagnosis not present

## 2023-09-03 DIAGNOSIS — E119 Type 2 diabetes mellitus without complications: Secondary | ICD-10-CM | POA: Diagnosis not present

## 2023-09-03 DIAGNOSIS — K219 Gastro-esophageal reflux disease without esophagitis: Secondary | ICD-10-CM | POA: Diagnosis not present

## 2023-09-03 DIAGNOSIS — I69398 Other sequelae of cerebral infarction: Secondary | ICD-10-CM | POA: Diagnosis not present

## 2023-09-03 DIAGNOSIS — Z7982 Long term (current) use of aspirin: Secondary | ICD-10-CM | POA: Diagnosis not present

## 2023-09-03 DIAGNOSIS — F17210 Nicotine dependence, cigarettes, uncomplicated: Secondary | ICD-10-CM | POA: Diagnosis not present

## 2023-09-03 DIAGNOSIS — J449 Chronic obstructive pulmonary disease, unspecified: Secondary | ICD-10-CM | POA: Diagnosis not present

## 2023-09-03 DIAGNOSIS — R34 Anuria and oliguria: Secondary | ICD-10-CM

## 2023-09-03 DIAGNOSIS — R531 Weakness: Secondary | ICD-10-CM | POA: Diagnosis not present

## 2023-09-03 DIAGNOSIS — Z7985 Long-term (current) use of injectable non-insulin antidiabetic drugs: Secondary | ICD-10-CM | POA: Diagnosis not present

## 2023-09-03 DIAGNOSIS — I10 Essential (primary) hypertension: Secondary | ICD-10-CM | POA: Diagnosis not present

## 2023-09-03 DIAGNOSIS — Z79899 Other long term (current) drug therapy: Secondary | ICD-10-CM | POA: Diagnosis not present

## 2023-09-03 DIAGNOSIS — Z7951 Long term (current) use of inhaled steroids: Secondary | ICD-10-CM | POA: Diagnosis not present

## 2023-09-03 DIAGNOSIS — Z7902 Long term (current) use of antithrombotics/antiplatelets: Secondary | ICD-10-CM | POA: Diagnosis not present

## 2023-09-03 NOTE — Progress Notes (Signed)
 Virtual Visit Consent   [name], you are scheduled for a virtual visit with a [facility] provider today. Just as with appointments in the office, your consent must be obtained to participate. Your consent will be active for this visit and any virtual visit you may have with one of our providers in the next 365 days. If you have a MyChart account, a copy of this consent can be sent to you electronically.  As this is a virtual visit, video technology does not allow for your provider to perform a traditional examination. This may limit your provider's ability to fully assess your condition. If your provider identifies any concerns that need to be evaluated in person or the need to arrange testing (such as labs, EKG, etc.), we will make arrangements to do so. Although advances in technology are sophisticated, we cannot ensure that it will always work on either your end or our end. If the connection with a video visit is poor, the visit may have to be switched to a telephone visit. With either a video or telephone visit, we are not always able to ensure that we have a secure connection.  By engaging in this virtual visit, you consent to the provision of healthcare and authorize for your insurance to be billed (if applicable) for the services provided during this visit. Depending on your insurance coverage, you may receive a charge related to this service.  I need to obtain your verbal consent now. Are you willing to proceed with your visit today? [name] has provided verbal consent on 09/03/2023 for a virtual visit (video or telephone). [name], [place]  Date: 09/03/2023 7:05 PM  Virtual Visit via Video Note   I, [name], connected with  [name]  ([id number], 1965-11-25) on 09/03/23 at  7:00 PM EDT by a video-enabled telemedicine application and verified that I am speaking with the correct person using two identifiers.  Location: Patient: Virtual Visit Location  Patient: Home Provider: Virtual Visit Location Provider: Home Office   I discussed the limitations of evaluation and management by telemedicine and the availability of in person appointments. The patient expressed understanding and agreed to proceed.    History of Present Illness: [name] is a 58 y.o. who identifies as a female who was assigned female at birth, and is being seen today for concern for worsening UTI symptoms. Was evaluated 3 weeks ago by our digital health team and treated with Keflex  for suspected uncomplicated UTI. Patient endorses taking medications as directed but without resolution of symptoms. Since then has continued with urgency and frequency, now woth worsening hesitation and oliguria over the past day. No urine output in the past 7 hours. Denies fever, chills or flank pain.   HPI: HPI  Problems:  Patient Active Problem List   Diagnosis Date Noted   Hemiplegia affecting dominant side, post-stroke (HCC) 06/24/2023   Pressure injury of skin 06/03/2023   Hypotension 06/03/2023   GERD (gastroesophageal reflux disease)    Anxiety    CVA (cerebral vascular accident) (HCC) 06/02/2023   Acute osteomyelitis of toe of left foot (HCC) 03/26/2023   Osteomyelitis of left foot (HCC) 03/25/2023   Type 2 diabetes mellitus with peripheral vascular disease (HCC) 03/25/2023   PVD (peripheral vascular disease) (HCC) 03/25/2023   HTN (hypertension) 03/25/2023   Chronic right hip pain 05/06/2022   Chronic pain 01/14/2022   Right sided weakness 12/18/2021   Hypokalemia 12/10/2021   High anion gap  metabolic acidosis 12/09/2021   Obesity (BMI 30-39.9) 12/09/2021   Hemiparesis of right dominant side as late effect of cerebrovascular disease (HCC) 09/21/2020   Vitamin D  deficiency 08/22/2020   Urinary incontinence 07/27/2020   Centrilobular emphysema (HCC) 07/10/2020   Moderate episode of recurrent major depressive disorder (HCC) 07/10/2020   Chronic pain syndrome 07/10/2020    Intractable chronic migraine without aura and with status migrainosus 07/10/2020   Chronic hypoxemic respiratory failure (HCC) 09/23/2017   Pulmonary nodules 09/23/2017   Trimalleolar fracture 08/09/2017   TIA (transient ischemic attack) 04/07/2017   Depression 04/07/2017   History of cerebrovascular accident (CVA) with residual deficit    Complicated migraine    Brain TIA 10/11/2012   Hyperlipidemia 10/11/2012   Nocturnal hypoxemia 10/11/2012   Diabetes mellitus without complication (HCC) 10/11/2012   Tobacco abuse 10/11/2012   COPD (chronic obstructive pulmonary disease) (HCC) 10/11/2012    Allergies:  Allergies  Allergen Reactions   Tramadol Nausea And Vomiting and Hypertension   Augmentin [Amoxicillin-Pot Clavulanate] Rash    Tolerated Zosyn  03/26/23   Medications:  Current Outpatient Medications:    albuterol  (VENTOLIN  HFA) 108 (90 Base) MCG/ACT inhaler, INHALE 2 INHALATIONS BY MOUTH  EVERY 4 HOURS AS NEEDED FOR  WHEEZING OR FOR SHORTNESS OF  BREATH, Disp: 51 g, Rfl: 1   aspirin  EC 81 MG tablet, Take 1 tablet (81 mg total) by mouth daily. Swallow whole., Disp: 90 tablet, Rfl: 0   atorvastatin  (LIPITOR) 40 MG tablet, TAKE 1 TABLET BY MOUTH AT  BEDTIME, Disp: 90 tablet, Rfl: 3   buPROPion  (WELLBUTRIN  XL) 150 MG 24 hr tablet, TAKE 1 TABLET BY MOUTH DAILY, Disp: 100 tablet, Rfl: 1   citalopram  (CELEXA ) 40 MG tablet, TAKE 1 TABLET BY MOUTH DAILY, Disp: 100 tablet, Rfl: 1   clopidogrel  (PLAVIX ) 75 MG tablet, TAKE 1 TABLET BY MOUTH DAILY AT  9AM, Disp: 100 tablet, Rfl: 1   fluticasone  (FLONASE ) 50 MCG/ACT nasal spray, Place 2 sprays into both nostrils daily. Use for 4-6 weeks then stop and use seasonally or as needed., Disp: 16 g, Rfl: 0   gabapentin  (NEURONTIN ) 600 MG tablet, TAKE 1 TABLET BY MOUTH 4 TIMES  DAILY, Disp: 400 tablet, Rfl: 2   guaiFENesin -codeine  100-10 MG/5ML syrup, Take 5 mLs by mouth every 6 (six) hours as needed., Disp: 120 mL, Rfl: 0   leptospermum manuka honey  (MEDIHONEY) gel, Apply 1 Application topically daily., Disp: 44 mL, Rfl: 0   methocarbamol  (ROBAXIN ) 500 MG tablet, Take 500 mg by mouth 3 (three) times daily. (Patient not taking: Reported on 06/23/2023), Disp: , Rfl:    midodrine  (PROAMATINE ) 5 MG tablet, Take 1 tablet (5 mg total) by mouth 3 (three) times daily with meals. (Patient not taking: Reported on 06/23/2023), Disp: 21 tablet, Rfl: 0   MOUNJARO  10 MG/0.5ML Pen, INJECT THE CONTENTS OF ONE PEN  SUBCUTANEOUSLY WEEKLY AS  DIRECTED, Disp: 6 mL, Rfl: 3   mupirocin  ointment (BACTROBAN ) 2 %, Apply 1 Application topically 2 (two) times daily. For 10 days, Disp: 22 g, Rfl: 0   nicotine  (NICODERM CQ  - DOSED IN MG/24 HOURS) 14 mg/24hr patch, Place onto the skin., Disp: , Rfl:    nystatin  (MYCOSTATIN /NYSTOP ) powder, Apply 1 Application topically 3 (three) times daily., Disp: 15 g, Rfl: 0   ondansetron  (ZOFRAN -ODT) 4 MG disintegrating tablet, DISSOLVE 1 TABLET ON THE TONGUE EVERY 8 HOURS AS NEEDED FOR NAUSEA AND VOMITING, Disp: 30 tablet, Rfl: 2   oxyCODONE -acetaminophen  (PERCOCET) 10-325 MG tablet, Take 1  tablet by mouth every 6 (six) hours as needed for pain., Disp: , Rfl:    pantoprazole  (PROTONIX ) 40 MG tablet, TAKE 1 TABLET BY MOUTH DAILY AT  9 AM, Disp: 100 tablet, Rfl: 1   QUEtiapine  (SEROQUEL ) 100 MG tablet, TAKE 1 TABLET BY MOUTH DAILY AT  9 PM AT BEDTIME, Disp: 100 tablet, Rfl: 0   rizatriptan  (MAXALT -MLT) 10 MG disintegrating tablet, Take 1 tablet (10 mg total) by mouth as needed. May repeat in 2 hours if needed, Disp: 10 tablet, Rfl: 2   topiramate  (TOPAMAX ) 100 MG tablet, TAKE 1 TABLET BY MOUTH TWICE  DAILY, Disp: 200 tablet, Rfl: 2   TRELEGY ELLIPTA  100-62.5-25 MCG/ACT AEPB, USE 1 INHALATION BY MOUTH ONCE  DAILY AT THE SAME TIME EACH DAY, Disp: 180 each, Rfl: 3   Vitamin D , Ergocalciferol , (DRISDOL ) 1.25 MG (50000 UNIT) CAPS capsule, TAKE 1 CAPSULE BY MOUTH EVERY WEDNESDAY @9AM , Disp: 4 capsule, Rfl: 11  Observations/Objective: Patient is  well-developed, well-nourished in no acute distress.  Resting comfortably  at home.  Head is normocephalic, atraumatic.  No labored breathing.  Speech is clear and coherent with logical content.  Patient is alert and oriented at baseline.   Assessment and Plan: 1. Complicated UTI (urinary tract infection) (Primary)  2. Oliguria  Needs ER evaluation giving persistent symptoms despite treatment and now with progressive symptoms and decreased urinary output. Agrees to be seen at [facility] this evening.   Follow Up Instructions: I discussed the assessment and treatment plan with the patient. The patient was provided an opportunity to ask questions and all were answered. The patient agreed with the plan and demonstrated an understanding of the instructions.  A copy of instructions were sent to the patient via MyChart unless otherwise noted below.   The patient was advised to call back or seek an in-person evaluation if the symptoms worsen or if the condition fails to improve as anticipated.    Deanna Velma Lunger, PA-C

## 2023-09-03 NOTE — Patient Instructions (Signed)
 [name], thank you for joining Deanna Velma Lunger, PA-C for today's virtual visit.  While this provider is not your primary care provider (PCP), if your PCP is located in our provider database this encounter information will be shared with them immediately following your visit.   A [facility] MyChart account gives you access to today's visit and all your visits, tests, and labs performed at [facility]  click here if you don't have a [facility] MyChart account or go to [email or web address]  Consent: (Patient) [name] provided verbal consent for this virtual visit at the beginning of the encounter.  Current Medications:  Current Outpatient Medications:    albuterol  (VENTOLIN  HFA) 108 (90 Base) MCG/ACT inhaler, INHALE 2 INHALATIONS BY MOUTH  EVERY 4 HOURS AS NEEDED FOR  WHEEZING OR FOR SHORTNESS OF  BREATH, Disp: 51 g, Rfl: 1   aspirin  EC 81 MG tablet, Take 1 tablet (81 mg total) by mouth daily. Swallow whole., Disp: 90 tablet, Rfl: 0   atorvastatin  (LIPITOR) 40 MG tablet, TAKE 1 TABLET BY MOUTH AT  BEDTIME, Disp: 90 tablet, Rfl: 3   buPROPion  (WELLBUTRIN  XL) 150 MG 24 hr tablet, TAKE 1 TABLET BY MOUTH DAILY, Disp: 100 tablet, Rfl: 1   citalopram  (CELEXA ) 40 MG tablet, TAKE 1 TABLET BY MOUTH DAILY, Disp: 100 tablet, Rfl: 1   clopidogrel  (PLAVIX ) 75 MG tablet, TAKE 1 TABLET BY MOUTH DAILY AT  9AM, Disp: 100 tablet, Rfl: 1   fluticasone  (FLONASE ) 50 MCG/ACT nasal spray, Place 2 sprays into both nostrils daily. Use for 4-6 weeks then stop and use seasonally or as needed., Disp: 16 g, Rfl: 0   gabapentin  (NEURONTIN ) 600 MG tablet, TAKE 1 TABLET BY MOUTH 4 TIMES  DAILY, Disp: 400 tablet, Rfl: 2   guaiFENesin -codeine  100-10 MG/5ML syrup, Take 5 mLs by mouth every 6 (six) hours as needed., Disp: 120 mL, Rfl: 0   leptospermum manuka honey (MEDIHONEY) gel, Apply 1 Application topically daily., Disp: 44 mL, Rfl: 0   methocarbamol  (ROBAXIN ) 500 MG tablet, Take 500 mg by  mouth 3 (three) times daily. (Patient not taking: Reported on 06/23/2023), Disp: , Rfl:    midodrine  (PROAMATINE ) 5 MG tablet, Take 1 tablet (5 mg total) by mouth 3 (three) times daily with meals. (Patient not taking: Reported on 06/23/2023), Disp: 21 tablet, Rfl: 0   MOUNJARO  10 MG/0.5ML Pen, INJECT THE CONTENTS OF ONE PEN  SUBCUTANEOUSLY WEEKLY AS  DIRECTED, Disp: 6 mL, Rfl: 3   mupirocin  ointment (BACTROBAN ) 2 %, Apply 1 Application topically 2 (two) times daily. For 10 days, Disp: 22 g, Rfl: 0   nicotine  (NICODERM CQ  - DOSED IN MG/24 HOURS) 14 mg/24hr patch, Place onto the skin., Disp: , Rfl:    nystatin  (MYCOSTATIN /NYSTOP ) powder, Apply 1 Application topically 3 (three) times daily., Disp: 15 g, Rfl: 0   ondansetron  (ZOFRAN -ODT) 4 MG disintegrating tablet, DISSOLVE 1 TABLET ON THE TONGUE EVERY 8 HOURS AS NEEDED FOR NAUSEA AND VOMITING, Disp: 30 tablet, Rfl: 2   oxyCODONE -acetaminophen  (PERCOCET) 10-325 MG tablet, Take 1 tablet by mouth every 6 (six) hours as needed for pain., Disp: , Rfl:    pantoprazole  (PROTONIX ) 40 MG tablet, TAKE 1 TABLET BY MOUTH DAILY AT  9 AM, Disp: 100 tablet, Rfl: 1   QUEtiapine  (SEROQUEL ) 100 MG tablet, TAKE 1 TABLET BY MOUTH DAILY AT  9 PM AT BEDTIME, Disp: 100 tablet, Rfl: 0   rizatriptan  (MAXALT -MLT) 10 MG disintegrating tablet, Take 1 tablet (10 mg total)  by mouth as needed. May repeat in 2 hours if needed, Disp: 10 tablet, Rfl: 2   topiramate  (TOPAMAX ) 100 MG tablet, TAKE 1 TABLET BY MOUTH TWICE  DAILY, Disp: 200 tablet, Rfl: 2   TRELEGY ELLIPTA  100-62.5-25 MCG/ACT AEPB, USE 1 INHALATION BY MOUTH ONCE  DAILY AT THE SAME TIME EACH DAY, Disp: 180 each, Rfl: 3   Vitamin D , Ergocalciferol , (DRISDOL ) 1.25 MG (50000 UNIT) CAPS capsule, TAKE 1 CAPSULE BY MOUTH EVERY WEDNESDAY @9AM , Disp: 4 capsule, Rfl: 11   Medications ordered in this encounter:  No orders of the defined types were placed in this encounter.    *If you need refills on other medications prior to your  next appointment, please contact your pharmacy*  Follow-Up: Call back or seek an in-person evaluation if the symptoms worsen or if the condition fails to improve as anticipated.  [facility] [phone]  Other Instructions Based on what you shared with me, I feel your condition warrants further evaluation as soon as possible at an Emergency department.     If you are having a true medical emergency please call 911.      Emergency Department-[place] [facility]  Get Driving Directions  [phone]  [address]  [place], [place] [address]  Open 24/7/365      [facility] Emergency Department at [facility]  Get Driving Directions  [address]  [place], [place] [address]  Open 24/7/365    Emergency Department- [facility]  Get Driving Directions  [phone]  [address]  [place], [place] [address]  Open 24/7/365      [facility] at [facility]  Get Driving Directions  [phone]  [address]  [place], [place] [address]  Open 24/7/365    [facility]  Emergency Department- [facility]  Get Driving Directions  [phone]  [address]  [place], [place] [address]  Open 24/7/365    [place]  Emergency Department- [facility]  Get Driving Directions  [address]  [place], [place] [address]  Open 24/7/365    [facility]  Emergency Department- [place] [facility]  Get Driving Directions  [phone]  [address]  [place], [place] [address]  Open 24/7/365      If you have been instructed to have an in-person evaluation today at a local Urgent Care facility, please use the link below. It will take you to a list of all of our available [facility], including address, phone number and hours of operation. Please do not delay care.  [facility]  If you or a  family member do not have a primary care provider, use the link below to schedule a visit and establish care. When you choose a [facility] primary care physician or advanced practice provider, you gain a long-term partner in health. Find a Primary Care Provider  Learn more about [facility] in-office and virtual care options: [facility] - Get Care Now

## 2023-09-04 DIAGNOSIS — G8929 Other chronic pain: Secondary | ICD-10-CM | POA: Diagnosis not present

## 2023-09-04 DIAGNOSIS — M5416 Radiculopathy, lumbar region: Secondary | ICD-10-CM | POA: Diagnosis not present

## 2023-09-04 DIAGNOSIS — M542 Cervicalgia: Secondary | ICD-10-CM | POA: Diagnosis not present

## 2023-09-04 DIAGNOSIS — M25571 Pain in right ankle and joints of right foot: Secondary | ICD-10-CM | POA: Diagnosis not present

## 2023-09-04 DIAGNOSIS — Z79899 Other long term (current) drug therapy: Secondary | ICD-10-CM | POA: Diagnosis not present

## 2023-09-04 DIAGNOSIS — Z9181 History of falling: Secondary | ICD-10-CM | POA: Diagnosis not present

## 2023-09-09 DIAGNOSIS — E119 Type 2 diabetes mellitus without complications: Secondary | ICD-10-CM | POA: Diagnosis not present

## 2023-09-09 DIAGNOSIS — Z7985 Long-term (current) use of injectable non-insulin antidiabetic drugs: Secondary | ICD-10-CM | POA: Diagnosis not present

## 2023-09-09 DIAGNOSIS — Z7951 Long term (current) use of inhaled steroids: Secondary | ICD-10-CM | POA: Diagnosis not present

## 2023-09-09 DIAGNOSIS — J449 Chronic obstructive pulmonary disease, unspecified: Secondary | ICD-10-CM | POA: Diagnosis not present

## 2023-09-09 DIAGNOSIS — I10 Essential (primary) hypertension: Secondary | ICD-10-CM | POA: Diagnosis not present

## 2023-09-09 DIAGNOSIS — Z7902 Long term (current) use of antithrombotics/antiplatelets: Secondary | ICD-10-CM | POA: Diagnosis not present

## 2023-09-09 DIAGNOSIS — K219 Gastro-esophageal reflux disease without esophagitis: Secondary | ICD-10-CM | POA: Diagnosis not present

## 2023-09-09 DIAGNOSIS — E785 Hyperlipidemia, unspecified: Secondary | ICD-10-CM | POA: Diagnosis not present

## 2023-09-09 DIAGNOSIS — Z7982 Long term (current) use of aspirin: Secondary | ICD-10-CM | POA: Diagnosis not present

## 2023-09-09 DIAGNOSIS — F17210 Nicotine dependence, cigarettes, uncomplicated: Secondary | ICD-10-CM | POA: Diagnosis not present

## 2023-09-09 DIAGNOSIS — R531 Weakness: Secondary | ICD-10-CM | POA: Diagnosis not present

## 2023-09-09 DIAGNOSIS — G43909 Migraine, unspecified, not intractable, without status migrainosus: Secondary | ICD-10-CM | POA: Diagnosis not present

## 2023-09-14 ENCOUNTER — Other Ambulatory Visit: Payer: Self-pay | Admitting: Family Medicine

## 2023-09-14 DIAGNOSIS — F5104 Psychophysiologic insomnia: Secondary | ICD-10-CM

## 2023-09-15 NOTE — Telephone Encounter (Signed)
 Requested medications are due for refill today.  yes  Requested medications are on the active medications list.  yes  Last refill. 06/15/2023 #100 1 rf  Future visit scheduled.   yes  Notes to clinic.  Refill not delegated.    Requested Prescriptions  Pending Prescriptions Disp Refills   QUEtiapine  (SEROQUEL ) 100 MG tablet [Pharmacy Med Name: QUEtiapine  Fumarate 100 MG Oral Tablet] 100 tablet 2    Sig: TAKE 1 TABLET BY MOUTH DAILY AT  9 PM AT BEDTIME     Not Delegated - Psychiatry:  Antipsychotics - Second Generation (Atypical) - quetiapine  Failed - 09/15/2023  3:16 PM      Failed - This refill cannot be delegated      Failed - Lipid Panel in normal range within the last 12 months    Cholesterol  Date Value Ref Range Status  06/03/2023 137 0 - 200 mg/dL Final  90/74/7985 792 (H) 0 - 200 mg/dL Final   Ldl Cholesterol, Calc  Date Value Ref Range Status  10/07/2012 127 (H) 0 - 100 mg/dL Final   LDL Cholesterol  Date Value Ref Range Status  06/03/2023 71 0 - 99 mg/dL Final    Comment:           Total Cholesterol/HDL:CHD Risk Coronary Heart Disease Risk Table                     Men   Women  1/2 Average Risk   3.4   3.3  Average Risk       5.0   4.4  2 X Average Risk   9.6   7.1  3 X Average Risk  23.4   11.0        Use the calculated Patient Ratio above and the CHD Risk Table to determine the patient's CHD Risk.        ATP III CLASSIFICATION (LDL):  <100     mg/dL   Optimal  899-870  mg/dL   Near or Above                    Optimal  130-159  mg/dL   Borderline  839-810  mg/dL   High  >809     mg/dL   Very High Performed at [facility], [address] [place], [place] [address]    HDL Cholesterol  Date Value Ref Range Status  10/07/2012 25 (L) 40 - 60 mg/dL Final   HDL  Date Value Ref Range Status  06/03/2023 31 (L) >40 mg/dL Final   Triglycerides  Date Value Ref Range Status  06/03/2023 177 (H) <150 mg/dL Final  90/74/7985 726 (H) 0 - 200 mg/dL  Final         Passed - TSH in normal range and within 360 days    TSH  Date Value Ref Range Status  06/02/2023 2.888 0.350 - 4.500 uIU/mL Final    Comment:    Performed by a 3rd Generation assay with a functional sensitivity of <=0.01 uIU/mL. Performed at [facility], [address] [place], [place] [address]          Passed - Completed PHQ-2 or PHQ-9 in the last 360 days      Passed - Last BP in normal range    BP Readings from Last 1 Encounters:  08/22/23 104/69         Passed - Last Heart Rate in normal range    Pulse Readings from  Last 1 Encounters:  08/22/23 86         Passed - Valid encounter within last 6 months    Recent Outpatient Visits           2 months ago Cryptogenic stroke [facility])   [place] [facility] [place], Marsa PARAS, DO   6 months ago Acute conjunctivitis of both eyes, unspecified acute conjunctivitis type   [place] [facility] [place], Marsa PARAS, DO       Future Appointments             In 3 weeks Riddle, Suzann, NP [place] [facility] - CBC within normal limits and completed in the last 12 months    WBC  Date Value Ref Range Status  08/24/2023 8.8 4.0 - 10.5 K/uL Final   RBC  Date Value Ref Range Status  08/24/2023 4.51 3.87 - 5.11 MIL/uL Final   Hemoglobin  Date Value Ref Range Status  08/24/2023 12.6 12.0 - 15.0 g/dL Final   HGB  Date Value Ref Range Status  09/22/2013 14.6 12.0 - 16.0 g/dL Final   HCT  Date Value Ref Range Status  08/24/2023 39.5 36.0 - 46.0 % Final  09/22/2013 44.3 35.0 - 47.0 % Final   MCHC  Date Value Ref Range Status  08/24/2023 31.9 30.0 - 36.0 g/dL Final   [facility]  Date Value Ref Range Status  08/24/2023 27.9 26.0 - 34.0 pg Final   MCV  Date Value Ref Range Status  08/24/2023 87.6 80.0 - 100.0 fL Final  09/22/2013 90 80 - 100 fL Final   No results found for: PLTCOUNTKUC, LABPLAT, POCPLA RDW   Date Value Ref Range Status  08/24/2023 15.1 11.5 - 15.5 % Final  09/22/2013 13.8 11.5 - 14.5 % Final         Passed - CMP within normal limits and completed in the last 12 months    Albumin  Date Value Ref Range Status  08/22/2023 3.0 (L) 3.5 - 5.0 g/dL Final  90/89/7984 2.6 (L) 3.4 - 5.0 g/dL Final   Alkaline Phosphatase  Date Value Ref Range Status  08/22/2023 65 38 - 126 U/L Final  09/22/2013 78 Unit/L Final    Comment:    46-116 NOTE: New Reference Range 08/02/13    Alkaline phosphatase (APISO)  Date Value Ref Range Status  07/03/2023 79 37 - 153 U/L Final   ALT  Date Value Ref Range Status  08/22/2023 8 0 - 44 U/L Final   SGPT (ALT)  Date Value Ref Range Status  09/22/2013 16 U/L Final    Comment:    14-63 NOTE: New Reference Range 08/02/13    AST  Date Value Ref Range Status  08/22/2023 13 (L) 15 - 41 U/L Final   SGOT(AST)  Date Value Ref Range Status  09/22/2013 17 15 - 37 Unit/L Final   BUN  Date Value Ref Range Status  08/24/2023 6 6 - 20 mg/dL Final  90/89/7984 11 7 - 18 mg/dL Final   Calcium   Date Value Ref Range Status  08/24/2023 8.6 (L) 8.9 - 10.3 mg/dL Final   Calcium , Total  Date Value Ref Range Status  09/22/2013 8.3 (L) 8.5 - 10.1 mg/dL Final   Calcium , Ion  Date Value Ref Range Status  04/07/2017 1.17 1.15 - 1.40 mmol/L Final   CO2  Date Value Ref Range Status  08/24/2023  22 22 - 32 mmol/L Final   Co2  Date Value Ref Range Status  09/22/2013 27 21 - 32 mmol/L Final   Bicarbonate  Date Value Ref Range Status  12/08/2021 24.6 20.0 - 28.0 mmol/L Final   TCO2  Date Value Ref Range Status  04/07/2017 29 22 - 32 mmol/L Final   Creat  Date Value Ref Range Status  07/03/2023 0.61 0.50 - 1.03 mg/dL Final   Creatinine, Ser  Date Value Ref Range Status  08/24/2023 0.52 0.44 - 1.00 mg/dL Final   Glucose  Date Value Ref Range Status  09/22/2013 335 (H) 65 - 99 mg/dL Final   Glucose, Bld  Date Value Ref Range Status   08/24/2023 141 (H) 70 - 99 mg/dL Final    Comment:    Glucose reference range applies only to samples taken after fasting for at least 8 hours.   Glucose-Capillary  Date Value Ref Range Status  06/02/2023 115 (H) 70 - 99 mg/dL Final    Comment:    Glucose reference range applies only to samples taken after fasting for at least 8 hours.   Potassium  Date Value Ref Range Status  08/24/2023 3.2 (L) 3.5 - 5.1 mmol/L Final  09/22/2013 3.8 3.5 - 5.1 mmol/L Final   Sodium  Date Value Ref Range Status  08/24/2023 136 135 - 145 mmol/L Final  09/22/2013 135 (L) 136 - 145 mmol/L Final   Total Bilirubin  Date Value Ref Range Status  08/22/2023 0.5 0.0 - 1.2 mg/dL Final   Bilirubin,Total  Date Value Ref Range Status  09/22/2013 0.2 0.2 - 1.0 mg/dL Final   Bilirubin, Direct  Date Value Ref Range Status  08/28/2012 < 0.1 0.00 - 0.20 mg/dL Final   Protein, ur  Date Value Ref Range Status  12/08/2021 30 (A) NEGATIVE mg/dL Final   Protein,UA  Date Value Ref Range Status  09/08/2022 Negative Negative/Trace Final   Total Protein  Date Value Ref Range Status  08/22/2023 6.5 6.5 - 8.1 g/dL Final  90/89/7984 7.2 6.4 - 8.2 g/dL Final   EGFR (African American)  Date Value Ref Range Status  09/22/2013 >60  Final   GFR calc Af Amer  Date Value Ref Range Status  08/18/2017 >60 >60 mL/min Final    Comment:    (NOTE) The eGFR has been calculated using the CKD EPI equation. This calculation has not been validated in all clinical situations. eGFR's persistently <60 mL/min signify possible Chronic Kidney Disease.    GFR  Date Value Ref Range Status  09/23/2017 91.12 >60.00 mL/min Final   eGFR  Date Value Ref Range Status  07/03/2023 103 > OR = 60 mL/min/1.31m2 Final   EGFR (Non-African Amer.)  Date Value Ref Range Status  09/22/2013 >60  Final    Comment:    eGFR values <51mL/min/1.73 m2 may be an indication of chronic kidney disease (CKD). Calculated eGFR is useful in  patients with stable renal function. The eGFR calculation will not be reliable in acutely ill patients when serum creatinine is changing rapidly. It is not useful in  patients on dialysis. The eGFR calculation may not be applicable to patients at the low and high extremes of body sizes, pregnant women, and vegetarians.    GFR, Estimated  Date Value Ref Range Status  08/24/2023 >60 >60 mL/min Final    Comment:    (NOTE) Calculated using the CKD-EPI Creatinine Equation (2021)

## 2023-09-16 DIAGNOSIS — Z7951 Long term (current) use of inhaled steroids: Secondary | ICD-10-CM | POA: Diagnosis not present

## 2023-09-16 DIAGNOSIS — Z7985 Long-term (current) use of injectable non-insulin antidiabetic drugs: Secondary | ICD-10-CM | POA: Diagnosis not present

## 2023-09-16 DIAGNOSIS — G43909 Migraine, unspecified, not intractable, without status migrainosus: Secondary | ICD-10-CM | POA: Diagnosis not present

## 2023-09-16 DIAGNOSIS — E785 Hyperlipidemia, unspecified: Secondary | ICD-10-CM | POA: Diagnosis not present

## 2023-09-16 DIAGNOSIS — Z7902 Long term (current) use of antithrombotics/antiplatelets: Secondary | ICD-10-CM | POA: Diagnosis not present

## 2023-09-16 DIAGNOSIS — Z79899 Other long term (current) drug therapy: Secondary | ICD-10-CM | POA: Diagnosis not present

## 2023-09-16 DIAGNOSIS — R531 Weakness: Secondary | ICD-10-CM | POA: Diagnosis not present

## 2023-09-16 DIAGNOSIS — E119 Type 2 diabetes mellitus without complications: Secondary | ICD-10-CM | POA: Diagnosis not present

## 2023-09-16 DIAGNOSIS — J449 Chronic obstructive pulmonary disease, unspecified: Secondary | ICD-10-CM | POA: Diagnosis not present

## 2023-09-16 DIAGNOSIS — K219 Gastro-esophageal reflux disease without esophagitis: Secondary | ICD-10-CM | POA: Diagnosis not present

## 2023-09-16 DIAGNOSIS — Z7982 Long term (current) use of aspirin: Secondary | ICD-10-CM | POA: Diagnosis not present

## 2023-09-16 DIAGNOSIS — I10 Essential (primary) hypertension: Secondary | ICD-10-CM | POA: Diagnosis not present

## 2023-09-16 DIAGNOSIS — F17210 Nicotine dependence, cigarettes, uncomplicated: Secondary | ICD-10-CM | POA: Diagnosis not present

## 2023-09-17 DIAGNOSIS — K219 Gastro-esophageal reflux disease without esophagitis: Secondary | ICD-10-CM | POA: Diagnosis not present

## 2023-09-17 DIAGNOSIS — Z7982 Long term (current) use of aspirin: Secondary | ICD-10-CM | POA: Diagnosis not present

## 2023-09-17 DIAGNOSIS — R531 Weakness: Secondary | ICD-10-CM | POA: Diagnosis not present

## 2023-09-17 DIAGNOSIS — Z79899 Other long term (current) drug therapy: Secondary | ICD-10-CM | POA: Diagnosis not present

## 2023-09-17 DIAGNOSIS — F17210 Nicotine dependence, cigarettes, uncomplicated: Secondary | ICD-10-CM | POA: Diagnosis not present

## 2023-09-17 DIAGNOSIS — Z7902 Long term (current) use of antithrombotics/antiplatelets: Secondary | ICD-10-CM | POA: Diagnosis not present

## 2023-09-17 DIAGNOSIS — E785 Hyperlipidemia, unspecified: Secondary | ICD-10-CM | POA: Diagnosis not present

## 2023-09-17 DIAGNOSIS — Z7951 Long term (current) use of inhaled steroids: Secondary | ICD-10-CM | POA: Diagnosis not present

## 2023-09-17 DIAGNOSIS — G43909 Migraine, unspecified, not intractable, without status migrainosus: Secondary | ICD-10-CM | POA: Diagnosis not present

## 2023-09-17 DIAGNOSIS — Z7985 Long-term (current) use of injectable non-insulin antidiabetic drugs: Secondary | ICD-10-CM | POA: Diagnosis not present

## 2023-09-17 DIAGNOSIS — E119 Type 2 diabetes mellitus without complications: Secondary | ICD-10-CM | POA: Diagnosis not present

## 2023-09-17 DIAGNOSIS — I10 Essential (primary) hypertension: Secondary | ICD-10-CM | POA: Diagnosis not present

## 2023-09-17 DIAGNOSIS — J449 Chronic obstructive pulmonary disease, unspecified: Secondary | ICD-10-CM | POA: Diagnosis not present

## 2023-09-17 DIAGNOSIS — I69398 Other sequelae of cerebral infarction: Secondary | ICD-10-CM | POA: Diagnosis not present

## 2023-09-18 ENCOUNTER — Emergency Department

## 2023-09-18 ENCOUNTER — Encounter: Payer: Self-pay | Admitting: Medical Oncology

## 2023-09-18 ENCOUNTER — Emergency Department
Admission: EM | Admit: 2023-09-18 | Discharge: 2023-09-18 | Disposition: A | Discharge status: Home / Self Care | Attending: Emergency Medicine | Admitting: Emergency Medicine

## 2023-09-18 ENCOUNTER — Other Ambulatory Visit: Payer: Self-pay

## 2023-09-18 DIAGNOSIS — R251 Tremor, unspecified: Secondary | ICD-10-CM | POA: Diagnosis not present

## 2023-09-18 DIAGNOSIS — I10 Essential (primary) hypertension: Secondary | ICD-10-CM | POA: Diagnosis not present

## 2023-09-18 DIAGNOSIS — E119 Type 2 diabetes mellitus without complications: Secondary | ICD-10-CM | POA: Insufficient documentation

## 2023-09-18 DIAGNOSIS — R531 Weakness: Secondary | ICD-10-CM | POA: Insufficient documentation

## 2023-09-18 DIAGNOSIS — R918 Other nonspecific abnormal finding of lung field: Secondary | ICD-10-CM | POA: Diagnosis not present

## 2023-09-18 LAB — COMPREHENSIVE METABOLIC PANEL WITH GFR
ALT: 8 U/L (ref 0–44)
AST: 18 U/L (ref 15–41)
Albumin: 3.3 g/dL — ABNORMAL LOW (ref 3.5–5.0)
Alkaline Phosphatase: 66 U/L (ref 38–126)
Anion gap: 12 (ref 5–15)
BUN: 10 mg/dL (ref 6–20)
CO2: 25 mmol/L (ref 22–32)
Calcium: 9.2 mg/dL (ref 8.9–10.3)
Chloride: 102 mmol/L (ref 98–111)
Creatinine, Ser: 0.64 mg/dL (ref 0.44–1.00)
GFR, Estimated: >60 mL/min (ref >60)
Glucose, Bld: 130 mg/dL — ABNORMAL HIGH (ref 70–99)
Potassium: 3.5 mmol/L (ref 3.5–5.1)
Sodium: 139 mmol/L (ref 135–145)
Total Bilirubin: 0.3 mg/dL (ref 0.0–1.2)
Total Protein: 7.3 g/dL (ref 6.5–8.1)

## 2023-09-18 LAB — URINALYSIS, ROUTINE W REFLEX MICROSCOPIC
Bilirubin Urine: NEGATIVE
Glucose, UA: NEGATIVE mg/dL
Hgb urine dipstick: NEGATIVE
Ketones, ur: NEGATIVE mg/dL
Leukocytes,Ua: NEGATIVE
Nitrite: NEGATIVE
Protein, ur: NEGATIVE mg/dL
Specific Gravity, Urine: 1.019 (ref 1.005–1.030)
pH: 7 (ref 5.0–8.0)

## 2023-09-18 LAB — CBC
HCT: 40.9 % (ref 36.0–46.0)
Hemoglobin: 13.3 g/dL (ref 12.0–15.0)
MCH: 28.1 pg (ref 26.0–34.0)
MCHC: 32.5 g/dL (ref 30.0–36.0)
MCV: 86.3 fL (ref 80.0–100.0)
Platelets: 391 K/uL (ref 150–400)
RBC: 4.74 MIL/uL (ref 3.87–5.11)
RDW: 15.3 % (ref 11.5–15.5)
WBC: 10.3 K/uL (ref 4.0–10.5)
nRBC: 0 % (ref 0.0–0.2)

## 2023-09-18 LAB — MAGNESIUM: Magnesium: 1.9 mg/dL (ref 1.7–2.4)

## 2023-09-18 MED ORDER — SODIUM CHLORIDE 0.9 % IV BOLUS
1000.0000 mL | Freq: Once | INTRAVENOUS | Status: AC
  Administered 2023-09-18: 1000 mL via INTRAVENOUS

## 2023-09-18 NOTE — ED Provider Notes (Signed)
 [facility] Provider Note    Event Date/Time   First MD Initiated Contact with Patient 09/18/23 1520     (approximate)   History   Weakness and Tremors   HPI  [name] is a 58 year old female with history of T2DM, CVA with residual right-sided deficits presenting to the emergency department for evaluation of weakness.  Over the past few days patient reports feeling generally weak.  She noticed some tremors of her left side that she does feel are now improved.  Had recent pneumonia and is concerned about recurrence.  No significant cough, congestion over the past few days.  No nausea, vomiting, abdominal pain.  Reviewed ER visit from 08/22/2023.  At that time patient presented with right-sided chest pain.  CT of the chest was performed without evidence of PE, but did demonstrate findings concerning for right lower lobe pneumonia.  She was discharged on Keflex  and azithromycin .     Physical Exam   Triage Vital Signs: ED Triage Vitals  Encounter Vitals Group     BP 09/18/23 1259 120/80     Girls Systolic BP Percentile --      Girls Diastolic BP Percentile --      Boys Systolic BP Percentile --      Boys Diastolic BP Percentile --      Pulse Rate 09/18/23 1259 100     Resp 09/18/23 1259 18     Temp 09/18/23 1259 98.2 F (36.8 C)     Temp Source 09/18/23 1259 Oral     SpO2 09/18/23 1259 100 %     Weight 09/18/23 1300 198 lb 6.6 oz (90 kg)     Height 09/18/23 1300 5' 6 (1.676 m)     Head Circumference --      Peak Flow --      Pain Score 09/18/23 1300 8     Pain Loc --      Pain Education --      Exclude from Growth Chart --     Most recent vital signs: Vitals:   09/18/23 1259  BP: 120/80  Pulse: 100  Resp: 18  Temp: 98.2 F (36.8 C)  SpO2: 100%     General: Awake, interactive  CV:  Regular rate, good peripheral perfusion.  Resp:  Unlabored respirations, lungs clear to auscultation Abd:  Nondistended, soft,  nontender Neuro:  Symmetric facial movement, fluid speech, mild generalized weakness worse on the right side consistent with known prior deficits.  No appreciable tremor during my evaluation.   ED Results / Procedures / Treatments   Labs (all labs ordered are listed, but only abnormal results are displayed) Labs Reviewed  COMPREHENSIVE METABOLIC PANEL WITH GFR - Abnormal; Notable for the following components:      Result Value   Glucose, Bld 130 (*)    Albumin 3.3 (*)    All other components within normal limits  CBC  MAGNESIUM   URINALYSIS, ROUTINE W REFLEX MICROSCOPIC  CBG MONITORING, ED     EKG EKG independently reviewed and interpreted by myself demonstrates:  EKG demonstrates sinus rhythm at rate of 96, PR 174, QTc 477, no acute ST changes  RADIOLOGY Imaging independently reviewed and interpreted by myself demonstrates:  CXR without acute findings.  Does have right-sided opacity that correlates with CTA findings from last month that I suspect is likely chronic.  Formal Radiology Read:  DG Chest Portable 1 View Result Date: 09/18/2023 CLINICAL DATA:  Weakness. EXAM: PORTABLE CHEST 1 VIEW  COMPARISON:  08/24/2023. FINDINGS: Patient is rotated to the right. The cardiomediastinal silhouette is otherwise within normal limits. Persistent subpleural opacity in the right mid lung zone. The left lung appears clear. No pleural effusion or pneumothorax. Cervical fixation hardware. Osteoarthritis of the bilateral glenohumeral joints. IMPRESSION: Persistent subpleural opacity in the right mid lung zone, which could reflect infection. Infarct may have a similar appearance. Electronically Signed   By: Harrietta Sherry M.D.   On: 09/18/2023 14:44    PROCEDURES:  Critical Care performed: No  Procedures   MEDICATIONS ORDERED IN ED: Medications  sodium chloride  0.9 % bolus 1,000 mL (1,000 mLs Intravenous New Bag/Given 09/18/23 1512)     IMPRESSION / MDM / ASSESSMENT AND PLAN / ED COURSE   I reviewed the triage vital signs and the nursing notes.  Differential diagnosis includes, but is not limited to, anemia, electrolyte abnormality, pneumonia, UTI, lower suspicion CVA, acute intracranial bleed absence of new focal deficits  Patient's presentation is most consistent with acute presentation with potential threat to life or bodily function.  58 year old female presenting to the emergency department for evaluation of weakness.  Stable vitals on presentation.  Labs with reassuring CBC, CMP.  Normal mag.  X-Jeremaih Klima without new findings, likely chronic right side opacity noted   Given stable appearance and absence of cardiorespiratory symptoms.  EKG assuring.  Urinalysis is pending.  I did consider head imaging given report of tremors, but patient reports she is not concerned about this and would like to defer on head imaging.  With her overall reassuring neurologic exam and absence of tremors on exam currently, do think this is reasonable.  Signed out to oncoming physician pending urinalysis and disposition.  With otherwise reassuring workup, suspect patient will be stable for discharge with antibiotics if indicated based on urinalysis.  Clinical Course as of 09/18/23 1535  Fri Sep 18, 2023  1520 Received signout. Follow up urine. Recently admitted for pna. Feels generally weak. Xray with questionable pna, but was recently treated with abx for pna.  [HD]    Clinical Course User Index [HD] Nicholaus Rolland BRAVO, MD     FINAL CLINICAL IMPRESSION(S) / ED DIAGNOSES   Final diagnoses:  Generalized weakness     Rx / DC Orders   ED Discharge Orders     None        Note:  This document was prepared using Dragon voice recognition software and may include unintentional dictation errors.   Levander Slate, MD 09/18/23 [phone]

## 2023-09-18 NOTE — Discharge Instructions (Signed)

## 2023-09-18 NOTE — ED Notes (Signed)
 Pt undressed and belongings placed in pt belonging bag. Pt placed into hospital gown.

## 2023-09-18 NOTE — ED Triage Notes (Signed)
 Pt from home via ems- reports of patient having left sided tremors that began around 11pm last night and having generalized weakness. Pt had CVA 54mo ago with rt sided weakness also 1 mo ago finished abx for PNA.   Of note: per ems- pt has bed bugs present in home and on person.

## 2023-09-18 NOTE — ED Provider Notes (Signed)
 Clinical Course as of 09/18/23 1645  Fri Sep 18, 2023  1520 Received signout. Follow up urine. Recently admitted for pna. Feels generally weak. Xray with questionable pna, but was recently treated with abx for pna.  [HD]  1613 Urinalysis, Routine w reflex microscopic -Urine, Clean Catch(!) Urinalysis not consistent with UTI [HD]    Clinical Course User Index [HD] Nicholaus Rolland BRAVO, MD   Patient was signed out to me and presented with tremors.  There was a questionable finding on her chest x-ray of either infection or pulmonary infarct however on last CT angio chest earlier this month that she had the same finding it was more consistent with infection at that time.  Given lack of elevated inflammatory markers, I do not think antibiotics are indicated at this time.  Patient was counseled on close follow-up precautions with her primary care physician if she should develop a fever to return.  All questions answered and patient voiced understanding and requested discharge  At time of discharge there is no evidence of acute life, limb, vision, or fertility threat. Patient has stable vital signs, pain is well controlled, patient is ambulatory and p.o. tolerant.  Discharge instructions were completed using the [employer] system. I would refer you to those at this time. All warnings prescriptions follow-up etc. were discussed in detail with the patient. Patient indicates understanding and is agreeable with this plan. All questions answered.  Patient is made aware that they may return to the emergency department for any worsening or new condition or for any other emergency.    Nicholaus Rolland BRAVO, MD 09/18/23 2021

## 2023-09-21 ENCOUNTER — Telehealth: Payer: Self-pay

## 2023-09-21 NOTE — Telephone Encounter (Addendum)
 Copied from CRM [id number]. Topic: General - Other >> Sep 21, 2023 12:19 PM Zebedee SAUNDERS wrote: Reason for CRM: Pt would like a call [phone] back regarding her lung issue.

## 2023-09-24 DIAGNOSIS — E785 Hyperlipidemia, unspecified: Secondary | ICD-10-CM | POA: Diagnosis not present

## 2023-09-24 DIAGNOSIS — Z7985 Long-term (current) use of injectable non-insulin antidiabetic drugs: Secondary | ICD-10-CM | POA: Diagnosis not present

## 2023-09-24 DIAGNOSIS — J449 Chronic obstructive pulmonary disease, unspecified: Secondary | ICD-10-CM | POA: Diagnosis not present

## 2023-09-24 DIAGNOSIS — I10 Essential (primary) hypertension: Secondary | ICD-10-CM | POA: Diagnosis not present

## 2023-09-24 DIAGNOSIS — F17210 Nicotine dependence, cigarettes, uncomplicated: Secondary | ICD-10-CM | POA: Diagnosis not present

## 2023-09-24 DIAGNOSIS — R531 Weakness: Secondary | ICD-10-CM | POA: Diagnosis not present

## 2023-09-24 DIAGNOSIS — Z7982 Long term (current) use of aspirin: Secondary | ICD-10-CM | POA: Diagnosis not present

## 2023-09-24 DIAGNOSIS — K219 Gastro-esophageal reflux disease without esophagitis: Secondary | ICD-10-CM | POA: Diagnosis not present

## 2023-09-24 DIAGNOSIS — Z79899 Other long term (current) drug therapy: Secondary | ICD-10-CM | POA: Diagnosis not present

## 2023-09-24 DIAGNOSIS — Z7902 Long term (current) use of antithrombotics/antiplatelets: Secondary | ICD-10-CM | POA: Diagnosis not present

## 2023-09-24 DIAGNOSIS — G43909 Migraine, unspecified, not intractable, without status migrainosus: Secondary | ICD-10-CM | POA: Diagnosis not present

## 2023-09-24 DIAGNOSIS — I69398 Other sequelae of cerebral infarction: Secondary | ICD-10-CM | POA: Diagnosis not present

## 2023-09-24 DIAGNOSIS — E119 Type 2 diabetes mellitus without complications: Secondary | ICD-10-CM | POA: Diagnosis not present

## 2023-09-24 DIAGNOSIS — Z7951 Long term (current) use of inhaled steroids: Secondary | ICD-10-CM | POA: Diagnosis not present

## 2023-09-27 ENCOUNTER — Other Ambulatory Visit: Payer: Self-pay | Admitting: Family Medicine

## 2023-09-27 DIAGNOSIS — E782 Mixed hyperlipidemia: Secondary | ICD-10-CM

## 2023-09-28 ENCOUNTER — Ambulatory Visit (INDEPENDENT_AMBULATORY_CARE_PROVIDER_SITE_OTHER): Admitting: Family Medicine

## 2023-09-28 ENCOUNTER — Encounter: Payer: Self-pay | Admitting: Family Medicine

## 2023-09-28 VITALS — BP 124/72 | HR 88 | Ht 66.0 in

## 2023-09-28 DIAGNOSIS — J432 Centrilobular emphysema: Secondary | ICD-10-CM | POA: Diagnosis not present

## 2023-09-28 DIAGNOSIS — R918 Other nonspecific abnormal finding of lung field: Secondary | ICD-10-CM

## 2023-09-28 DIAGNOSIS — J189 Pneumonia, unspecified organism: Secondary | ICD-10-CM | POA: Diagnosis not present

## 2023-09-28 DIAGNOSIS — Z23 Encounter for immunization: Secondary | ICD-10-CM | POA: Diagnosis not present

## 2023-09-28 MED ORDER — LEVOFLOXACIN 500 MG PO TABS: 500.0000 mg | ORAL_TABLET | Freq: Every day | ORAL | 0 refills | Status: DC

## 2023-09-28 NOTE — Progress Notes (Signed)
 Subjective:    Patient ID: [name], female    DOB: August 18, 1965, 58 y.o.   MRN: [id number]  [name] is a 58 y.o. female presenting on 09/28/2023 for Hospitalization Follow-up   HPI  Discussed the use of AI scribe software for clinical note transcription with the patient, who gave verbal consent to proceed.  History of Present Illness   [name] is a 58 year old female who presents with persistent right lung opacity and difficulty breathing.  Dyspnea and pleuritic chest pain COPD Emphysema R Mid Lung Opacity  ED Visit 08/22/23 and 09/18/23  - Difficulty taking deep breaths since August 9th, 2025 - Painful to inhale deeply, described as sharp pain with deep inspiration - Pain limits ability to inhale fully - No fever, chills, or significant cough - No improvement in symptoms after recent course of azithromycin  (Z-Pak)  Pulmonary imaging abnormality - Abnormality in right lung identified on scan August 9th, 2025 - Follow-up chest x-ray on September 5th, 2025 showed persistent subpleural opacity in the right mid lung zone - Initial treatment for presumed pneumonia with azithromycin  - Unable to take Augmentin due to previous reaction  Diabetes mellitus management - Diabetes managed with Mounjaro  - Blood sugar levels well controlled on current regimen Last A1c 5 range, 5/20205 due for repeat when returns  History Cerebrovascular accident sequelae with residual deficits - History of stroke affecting right eye      Health Maintenance: Flu Shot today     06/23/2023    2:47 PM 05/22/2023    3:28 PM 05/16/2022    3:34 PM  Depression screen PHQ 2/9  Decreased Interest 1 2 1   Down, Depressed, Hopeless 1 2 1   PHQ - 2 Score 2 4 2   Altered sleeping 3  1  Tired, decreased energy 3 2 1   Change in appetite 2  0  Feeling bad or failure about yourself  2  0  Trouble concentrating 3  0  Moving slowly or fidgety/restless 3  0  Suicidal thoughts 0  0  PHQ-9 Score 18  4   Difficult doing work/chores Somewhat difficult Somewhat difficult Somewhat difficult       06/23/2023    2:47 PM 05/06/2022    2:44 PM 05/15/2021    1:59 PM 04/17/2021    2:35 PM  GAD 7 : Generalized Anxiety Score  Nervous, Anxious, on Edge 1 1 2 2   Control/stop worrying 2 1 0 0  Worry too much - different things 0 1 2 1   Trouble relaxing 3 1 3 3   Restless 0 1 3 3   Easily annoyed or irritable 3 2 3 3   Afraid - awful might happen 0 0 0 0  Total GAD 7 Score 9 7 13 12   Anxiety Difficulty Not difficult at all Not difficult at all Somewhat difficult Not difficult at all    Social History   Tobacco Use   Smoking status: Every Day    Current packs/day: 1.00    Average packs/day: 1 pack/day for 20.0 years (20.0 ttl pk-yrs)    Types: Cigarettes   Smokeless tobacco: Never  Vaping Use   Vaping status: Never Used  Substance Use Topics   Alcohol  use: No   Drug use: Yes    Types: Marijuana, Oxycodone     Comment: last smoked 2 days ago  8/4    Review of Systems Per HPI unless specifically indicated above     Objective:    BP 124/72 (BP  Location: Left Arm, Patient Position: Sitting, Cuff Size: Normal)   Pulse 88   Ht 5' 6 (1.676 m)   SpO2 95%   BMI 32.02 kg/m   Wt Readings from Last 3 Encounters:  09/18/23 198 lb 6.6 oz (90 kg)  08/22/23 209 lb (94.8 kg)  06/02/23 215 lb 8 oz (97.8 kg)    Physical Exam Vitals and nursing note reviewed.  Constitutional:      General: She is not in acute distress.    Appearance: Normal appearance. She is well-developed. She is not diaphoretic.     Comments: Chronic ill appearing, comfortable, cooperative  HENT:     Head: Normocephalic and atraumatic.  Eyes:     General:        Right eye: No discharge.        Left eye: No discharge.     Conjunctiva/sclera: Conjunctivae normal.  Cardiovascular:     Rate and Rhythm: Normal rate.  Pulmonary:     Effort: Pulmonary effort is normal. No respiratory distress.     Breath sounds: Normal  breath sounds. No wheezing, rhonchi or rales.     Comments: Occasional cough. No focal abnormality within lungs on auscultation Musculoskeletal:     Right lower leg: No edema.     Left lower leg: No edema.     Comments: In wheelchair  Right side upper and lower extremity weakness on exam.  Skin:    General: Skin is warm and dry.     Findings: No erythema or rash.  Neurological:     Mental Status: She is alert and oriented to person, place, and time.  Psychiatric:        Mood and Affect: Mood normal.        Behavior: Behavior normal.        Thought Content: Thought content normal.     Comments: Well groomed, good eye contact, normal speech and thoughts     I have personally reviewed the radiology report from 08/22/23 on CTA.  Narrative & Impression  EXAM: CTA of the Chest with contrast for PE 08/22/2023 09:23:29 AM   TECHNIQUE: CTA of the chest was performed after the administration of intravenous contrast. Multiplanar reformatted images are provided for review. MIP images are provided for review. Automated exposure control, iterative reconstruction, and/or weight based adjustment of the mA/kV was utilized to reduce the radiation dose to as low as reasonably achievable.   COMPARISON: Chest radiograph of 06/29/2023 and lung cancer screening CT of 05/14/2023.   CLINICAL HISTORY: Pulmonary embolism (PE) suspected, high prob. BIB [facility] home for CP that started last night around 1000. Pt states worse when breathing deep. 9/10 pain scale. Pt states has had cough for about a week. Non-productive. EMS reports clear lungs bilaterally. Pt also having a recent stroke affecting R side about a month ago. Pt currently taking Keflex  for UTI that was dx 3 days ago over the phone. Pt is current smoker.   FINDINGS:   PULMONARY ARTERIES: The quality of the exam for evaluation of pulmonary embolism is poor. The bolus is well timed. The patient's arms are not raised above the head. Exam  also degraded by patient body habitus. No central or lobar pulmonary embolism identified. 3-vessel coronary artery calcification.   MEDIASTINUM: Mild cardiomegaly. Pulmonary artery enlargement, outflow tract 3.3 cm.   LYMPH NODES: Upper normal prevascular node of 6 mm. No hilar adenopathy.   LUNGS AND PLEURA: Small right pleural effusion. Centrilobular emphysema. Subpleural lateral right middle lobe  airspace disease is new including on image 86 of series 5. Minimal ground-glass in the subpleural lingula on is also new. Focal left-sided pleural thickening including at maximally 1.2 cm on 08/04 with similar to the prior nonspecific.   UPPER ABDOMEN: Limited images of the upper abdomen are unremarkable.   SOFT TISSUES AND BONES: Low cervical spine fixation.   IMPRESSION: 1. No central or lobar pulmonary embolism identified. The quality of the exam for evaluation of pulmonary embolism is poor, limited by patient body habitus and arms not raised above the head. 2. New subpleural lateral right middle lobe airspace disease and minimal ground-glass in the subpleural Lingula. Findings are likely related to infection. Infarcts could look similar 3. Small right pleural effusion.   Electronically signed by: Rockey Kilts MD 08/22/2023 09:58 AM EDT RP Workstation: [id number]    I have personally reviewed the radiology report from 09/18/23 on CXR.  Narrative & Impression  CLINICAL DATA:  Weakness.   EXAM: PORTABLE CHEST 1 VIEW   COMPARISON:  08/24/2023.   FINDINGS: Patient is rotated to the right. The cardiomediastinal silhouette is otherwise within normal limits. Persistent subpleural opacity in the right mid lung zone. The left lung appears clear. No pleural effusion or pneumothorax. Cervical fixation hardware. Osteoarthritis of the bilateral glenohumeral joints.   IMPRESSION: Persistent subpleural opacity in the right mid lung zone, which could reflect infection. Infarct may  have a similar appearance.     Electronically Signed   By: Harrietta Sherry M.D.   On: 09/18/2023 14:44    Results for orders placed or performed during the hospital encounter of 09/18/23  Comprehensive metabolic panel   Collection Time: 09/18/23  1:09 PM  Result Value Ref Range   Sodium 139 135 - 145 mmol/L   Potassium 3.5 3.5 - 5.1 mmol/L   Chloride 102 98 - 111 mmol/L   CO2 25 22 - 32 mmol/L   Glucose, Bld 130 (H) 70 - 99 mg/dL   BUN 10 6 - 20 mg/dL   Creatinine, Ser 9.35 0.44 - 1.00 mg/dL   Calcium  9.2 8.9 - 10.3 mg/dL   Total Protein 7.3 6.5 - 8.1 g/dL   Albumin 3.3 (L) 3.5 - 5.0 g/dL   AST 18 15 - 41 U/L   ALT 8 0 - 44 U/L   Alkaline Phosphatase 66 38 - 126 U/L   Total Bilirubin 0.3 0.0 - 1.2 mg/dL   GFR, Estimated >39 >39 mL/min   Anion gap 12 5 - 15  CBC   Collection Time: 09/18/23  1:09 PM  Result Value Ref Range   WBC 10.3 4.0 - 10.5 K/uL   RBC 4.74 3.87 - 5.11 MIL/uL   Hemoglobin 13.3 12.0 - 15.0 g/dL   HCT 59.0 63.9 - 53.9 %   MCV 86.3 80.0 - 100.0 fL   MCH 28.1 26.0 - 34.0 pg   MCHC 32.5 30.0 - 36.0 g/dL   RDW 84.6 88.4 - 84.4 %   Platelets 391 150 - 400 K/uL   nRBC 0.0 0.0 - 0.2 %  Magnesium    Collection Time: 09/18/23  1:09 PM  Result Value Ref Range   Magnesium  1.9 1.7 - 2.4 mg/dL  Urinalysis, Routine w reflex microscopic -Urine, Clean Catch   Collection Time: 09/18/23  3:44 PM  Result Value Ref Range   Color, Urine YELLOW (A) YELLOW   APPearance HAZY (A) CLEAR   Specific Gravity, Urine 1.019 1.005 - 1.030   pH 7.0 5.0 - 8.0   Glucose,  UA NEGATIVE NEGATIVE mg/dL   Hgb urine dipstick NEGATIVE NEGATIVE   Bilirubin Urine NEGATIVE NEGATIVE   Ketones, ur NEGATIVE NEGATIVE mg/dL   Protein, ur NEGATIVE NEGATIVE mg/dL   Nitrite NEGATIVE NEGATIVE   Leukocytes,Ua NEGATIVE NEGATIVE      Assessment & Plan:   Problem List Items Addressed This Visit     Centrilobular emphysema (HCC)   Relevant Orders   Ambulatory referral to Pulmonology   Other  Visit Diagnoses       Lingular pneumonia    -  Primary   Relevant Medications   levofloxacin  (LEVAQUIN ) 500 MG tablet   Other Relevant Orders   Ambulatory referral to Pulmonology     Opacity of lung on imaging study       Relevant Orders   Ambulatory referral to Pulmonology     Needs flu shot         Flu vaccine need       Relevant Orders   Flu vaccine trivalent PF, 6mos and older(Flulaval,Afluria,Fluarix,Fluzone) (Completed)        Persistent right mid-lung subpleural opacity with pleuritic pain Persistent subpleural opacity in right mid-lung zone. Differential includes unresolved pneumonia or pulmonary infarct. Symptoms include pleuritic pain and difficulty with deep breaths.  ED visits already reviewed and imaging from CTA, CXR reviewed see above  Due to persistent issue, we will escalate to pulm referral - Refer to pulmonology for further evaluation. - Prescribe Levaquin  for 7 days. Additional coverage for possible empiric pneumonia for this opacity, she already was treated with Azithromycin  5-6 weeks ago. - No further imaging; await pulmonology consultation.  Type 2 diabetes mellitus Type 2 diabetes well-controlled with Mounjaro . Last A1c was very good 5 range - Schedule follow-up in three months for diabetes check-up, including A1c, urine test, and foot exam. - Encourage eye check-up due to previous stroke affecting vision.  General Health Maintenance Routine health maintenance discussed. Flu shot requested. Future consideration for pneumonia vaccine booster. Need to catch up on mammogram and colon screening. - Administer flu shot today. - Plan for future pneumonia vaccine booster. - Schedule mammogram and colon screening in future. Return in 3 months for routine visit.       Orders Placed This Encounter  Procedures   Flu vaccine trivalent PF, 6mos and older(Flulaval,Afluria,Fluarix,Fluzone)   Ambulatory referral to Pulmonology    Referral Priority:   Routine     Referral Type:   Consultation    Referral Reason:   Specialty Services Required    Requested Specialty:   Pulmonary Disease    Number of Visits Requested:   1    Meds ordered this encounter  Medications   levofloxacin  (LEVAQUIN ) 500 MG tablet    Sig: Take 1 tablet (500 mg total) by mouth daily. For 7 days    Dispense:  7 tablet    Refill:  0    Follow up plan: Return in about 3 months (around 12/28/2023) for 3 month DM A1c Urine Micro, DM Foot updates.   Marsa Officer, DO [facility] 09/28/2023, 2:16 PM

## 2023-09-28 NOTE — Patient Instructions (Addendum)
 Thank you for coming to the office today.  Start taking Levaquin  antibiotic 500mg  daily x 7 days No more imaging today Referral to Lung Doctors to revaluate this area of concern R middle lung  [facility] [address] [place],  [place]  [address] Main: [phone]  Please schedule a Follow-up Appointment to: Return in about 3 months (around 12/28/2023) for 3 month DM A1c Urine Micro, DM Foot updates.  If you have any other questions or concerns, please feel free to call the office or send a message through MyChart. You may also schedule an earlier appointment if necessary.  Additionally, you may be receiving a survey about your experience at our office within a few days to 1 week by e-mail or mail. We value your feedback.  Marsa Officer, DO [facility], [place]

## 2023-09-29 NOTE — Telephone Encounter (Signed)
 Too soon for refill, LRF 01/02/23 for 90 and 3 RF.  Requested Prescriptions  Pending Prescriptions Disp Refills   atorvastatin  (LIPITOR) 40 MG tablet [Pharmacy Med Name: Atorvastatin  Calcium  40 MG Oral Tablet] 100 tablet 2    Sig: TAKE 1 TABLET BY MOUTH AT  BEDTIME     Cardiovascular:  Antilipid - Statins Failed - 09/29/2023 12:04 PM      Failed - Lipid Panel in normal range within the last 12 months    Cholesterol  Date Value Ref Range Status  06/03/2023 137 0 - 200 mg/dL Final  90/74/7985 792 (H) 0 - 200 mg/dL Final   Ldl Cholesterol, Calc  Date Value Ref Range Status  10/07/2012 127 (H) 0 - 100 mg/dL Final   LDL Cholesterol  Date Value Ref Range Status  06/03/2023 71 0 - 99 mg/dL Final    Comment:           Total Cholesterol/HDL:CHD Risk Coronary Heart Disease Risk Table                     Men   Women  1/2 Average Risk   3.4   3.3  Average Risk       5.0   4.4  2 X Average Risk   9.6   7.1  3 X Average Risk  23.4   11.0        Use the calculated Patient Ratio above and the CHD Risk Table to determine the patient's CHD Risk.        ATP III CLASSIFICATION (LDL):  <100     mg/dL   Optimal  899-870  mg/dL   Near or Above                    Optimal  130-159  mg/dL   Borderline  839-810  mg/dL   High  >809     mg/dL   Very High Performed at [facility], [address] [place], [place] [address]    HDL Cholesterol  Date Value Ref Range Status  10/07/2012 25 (L) 40 - 60 mg/dL Final   HDL  Date Value Ref Range Status  06/03/2023 31 (L) >40 mg/dL Final   Triglycerides  Date Value Ref Range Status  06/03/2023 177 (H) <150 mg/dL Final  90/74/7985 726 (H) 0 - 200 mg/dL Final         Passed - Patient is not pregnant      Passed - Valid encounter within last 12 months    Recent Outpatient Visits           Yesterday Lingular pneumonia   [place] [facility] [place], Marsa PARAS, DO   3 months ago Cryptogenic stroke [facility])    [place] [facility] [place], Marsa PARAS, DO   7 months ago Acute conjunctivitis of both eyes, unspecified acute conjunctivitis type   [place] [facility] [place] Marsa PARAS, DO       Future Appointments             In 1 week Riddle, Suzann, NP [facility]

## 2023-09-30 ENCOUNTER — Ambulatory Visit: Admitting: Pulmonary Disease

## 2023-10-01 DIAGNOSIS — I10 Essential (primary) hypertension: Secondary | ICD-10-CM | POA: Diagnosis not present

## 2023-10-01 DIAGNOSIS — R531 Weakness: Secondary | ICD-10-CM | POA: Diagnosis not present

## 2023-10-01 DIAGNOSIS — Z7982 Long term (current) use of aspirin: Secondary | ICD-10-CM | POA: Diagnosis not present

## 2023-10-01 DIAGNOSIS — Z7985 Long-term (current) use of injectable non-insulin antidiabetic drugs: Secondary | ICD-10-CM | POA: Diagnosis not present

## 2023-10-01 DIAGNOSIS — I69398 Other sequelae of cerebral infarction: Secondary | ICD-10-CM | POA: Diagnosis not present

## 2023-10-01 DIAGNOSIS — J449 Chronic obstructive pulmonary disease, unspecified: Secondary | ICD-10-CM | POA: Diagnosis not present

## 2023-10-01 DIAGNOSIS — G43909 Migraine, unspecified, not intractable, without status migrainosus: Secondary | ICD-10-CM | POA: Diagnosis not present

## 2023-10-01 DIAGNOSIS — Z79899 Other long term (current) drug therapy: Secondary | ICD-10-CM | POA: Diagnosis not present

## 2023-10-01 DIAGNOSIS — E785 Hyperlipidemia, unspecified: Secondary | ICD-10-CM | POA: Diagnosis not present

## 2023-10-01 DIAGNOSIS — F17210 Nicotine dependence, cigarettes, uncomplicated: Secondary | ICD-10-CM | POA: Diagnosis not present

## 2023-10-01 DIAGNOSIS — Z7951 Long term (current) use of inhaled steroids: Secondary | ICD-10-CM | POA: Diagnosis not present

## 2023-10-01 DIAGNOSIS — K219 Gastro-esophageal reflux disease without esophagitis: Secondary | ICD-10-CM | POA: Diagnosis not present

## 2023-10-01 DIAGNOSIS — Z7902 Long term (current) use of antithrombotics/antiplatelets: Secondary | ICD-10-CM | POA: Diagnosis not present

## 2023-10-01 DIAGNOSIS — E119 Type 2 diabetes mellitus without complications: Secondary | ICD-10-CM | POA: Diagnosis not present

## 2023-10-05 DIAGNOSIS — M5416 Radiculopathy, lumbar region: Secondary | ICD-10-CM | POA: Diagnosis not present

## 2023-10-05 DIAGNOSIS — Z79899 Other long term (current) drug therapy: Secondary | ICD-10-CM | POA: Diagnosis not present

## 2023-10-05 DIAGNOSIS — G8929 Other chronic pain: Secondary | ICD-10-CM | POA: Diagnosis not present

## 2023-10-05 DIAGNOSIS — M25571 Pain in right ankle and joints of right foot: Secondary | ICD-10-CM | POA: Diagnosis not present

## 2023-10-05 DIAGNOSIS — Z9181 History of falling: Secondary | ICD-10-CM | POA: Diagnosis not present

## 2023-10-05 DIAGNOSIS — M542 Cervicalgia: Secondary | ICD-10-CM | POA: Diagnosis not present

## 2023-10-07 ENCOUNTER — Ambulatory Visit: Admitting: Internal Medicine

## 2023-10-08 ENCOUNTER — Ambulatory Visit: Attending: Cardiology | Admitting: Cardiology

## 2023-10-08 NOTE — Progress Notes (Deleted)
 Electrophysiology Clinic Note    Date:  10/08/2023  Patient ID:  Shaleah, Nissley 02-May-1965, MRN [id number] PCP:  [place] Marsa PARAS, DO  Cardiologist:  Redell Cave, MD  Electrophysiology APP:  Isayah Ignasiak, NP    ***refresh  Discussed the use of AI scribe software for clinical note transcription with the patient, who gave verbal consent to proceed.   Patient Profile    Chief Complaint: stroke  History of Present Illness: FELESHA MONCRIEFFE is a 58 y.o. female with PMH notable for recurrent cryptogenic strokes, HTN, HLD, T2DM, PAD; seen today for post hospital follow up.    Admitted 06/04/2023 with CVA. Zio monitor at discharge did not reveal AFib.   On follow-up today, ***   Since discharge from hospital the patient reports doing ***.  she denies chest pain, palpitations, dyspnea, PND, orthopnea, nausea, vomiting, dizziness, syncope, edema, weight gain, or early satiety.      Arrhythmia/Device History No specialty comments available.    ROS:  Please see the history of present illness. All other systems are reviewed and otherwise negative.    Physical Exam    VS:  There were no vitals taken for this visit. BMI: There is no height or weight on file to calculate BMI.      Wt Readings from Last 3 Encounters:  09/18/23 198 lb 6.6 oz (90 kg)  08/22/23 209 lb (94.8 kg)  06/02/23 215 lb 8 oz (97.8 kg)     GEN- The patient is well appearing, alert and oriented x 3 today.   Lungs- Clear to ausculation bilaterally, normal work of breathing.  Heart- {Blank single:19197::Regular,Irregularly irregular} rate and rhythm, no murmurs, rubs or gallops Extremities- {EDEMA LEVEL:28147::No} peripheral edema, warm, dry   Studies Reviewed   Previous EP, cardiology notes.    EKG is ordered. Personal review of EKG from today shows:  ***        Long term monitor, 07/24/2023 HR 73 - 136, average 86 bpm. 1 nonsustained SVT (longest 8 beats) No atrial  fibrillation detected. Rare supraventricular ectopy. Rare ventricular ectopy. No sustained arrhythmias. No symptom trigger episodes.  TTE, 06/03/2023  1. Negative Bubble Study.   2. TDS techically difficult study.   3. Left ventricular ejection fraction, by estimation, is 50 to 55%. The  left ventricle has low normal function. The left ventricle has no regional  wall motion abnormalities. Left ventricular diastolic parameters were  normal.   4. Right ventricular systolic function is normal. The right ventricular size is normal.   5. The mitral valve is normal in structure. No evidence of mitral valve  regurgitation.   6. The aortic valve is normal in structure. Aortic valve regurgitation is  not visualized.   TTE, 03/29/2023  1. Left ventricular ejection fraction, by estimation, is 55 to 60%. The left ventricle has normal function. The left ventricle has no regional wall motion abnormalities. Left ventricular diastolic parameters were normal.   2. Right ventricular systolic function is normal. The right ventricular size is normal.   3. The mitral valve is normal in structure. Trivial mitral valve regurgitation. No evidence of mitral stenosis.   4. The aortic valve is normal in structure. Aortic valve regurgitation is not visualized. No aortic stenosis is present.   5. The inferior vena cava is normal in size with greater than 50%  respiratory variability, suggesting right atrial pressure of 3 mmHg.   Long term monitor, 12/25/2022 occasional paroxysmal SVT. No atrial fibrillation or atrial  flutter to suggest etiology of CVA. No significant sustained arrhythmias.   Assessment and Plan     #) cryptogenic stroke, recurrent ***   #) ***   {Are you ordering a CV Procedure (e.g. stress test, cath, DCCV, TEE, etc)?   Press F2        :[id number]}   Current medicines are reviewed at length with the patient today.   The patient {ACTIONS; HAS/DOES NOT HAVE:19233} concerns regarding  her medicines.  The following changes were made today:  {NONE DEFAULTED:18576}  Labs/ tests ordered today include: *** No orders of the defined types were placed in this encounter.    Disposition: Follow up with {EPMDS:28135::EP Team} or EP APP {EPFOLLOW UP:28173}   Signed, Chantal Needle, NP  10/08/23  9:56 AM  Electrophysiology [facility]

## 2023-10-08 NOTE — Progress Notes (Signed)
 [name]                                          MRN: [id number]   10/08/2023   The [facility] Quality Team Specialist reviewed this patient medical record for the purposes of chart review for care gap closure. The following were reviewed: chart review for care gap closure-colorectal cancer screening.    [facility]BCI Quality Team

## 2023-10-09 NOTE — Progress Notes (Signed)
 [name]                                          MRN: [id number]   10/09/2023   The VBCI Quality Team Specialist reviewed this patient medical record for the purposes of chart review for care gap closure. The following were reviewed: chart review for care gap closure-glycemic status assessment.    VBCI Quality Team

## 2023-10-12 DIAGNOSIS — Z7985 Long-term (current) use of injectable non-insulin antidiabetic drugs: Secondary | ICD-10-CM | POA: Diagnosis not present

## 2023-10-12 DIAGNOSIS — Z7982 Long term (current) use of aspirin: Secondary | ICD-10-CM | POA: Diagnosis not present

## 2023-10-12 DIAGNOSIS — J449 Chronic obstructive pulmonary disease, unspecified: Secondary | ICD-10-CM | POA: Diagnosis not present

## 2023-10-12 DIAGNOSIS — I69398 Other sequelae of cerebral infarction: Secondary | ICD-10-CM | POA: Diagnosis not present

## 2023-10-12 DIAGNOSIS — G43909 Migraine, unspecified, not intractable, without status migrainosus: Secondary | ICD-10-CM | POA: Diagnosis not present

## 2023-10-12 DIAGNOSIS — Z79899 Other long term (current) drug therapy: Secondary | ICD-10-CM | POA: Diagnosis not present

## 2023-10-12 DIAGNOSIS — Z7951 Long term (current) use of inhaled steroids: Secondary | ICD-10-CM | POA: Diagnosis not present

## 2023-10-12 DIAGNOSIS — R531 Weakness: Secondary | ICD-10-CM | POA: Diagnosis not present

## 2023-10-12 DIAGNOSIS — Z7902 Long term (current) use of antithrombotics/antiplatelets: Secondary | ICD-10-CM | POA: Diagnosis not present

## 2023-10-12 DIAGNOSIS — I10 Essential (primary) hypertension: Secondary | ICD-10-CM | POA: Diagnosis not present

## 2023-10-12 DIAGNOSIS — K219 Gastro-esophageal reflux disease without esophagitis: Secondary | ICD-10-CM | POA: Diagnosis not present

## 2023-10-12 DIAGNOSIS — E785 Hyperlipidemia, unspecified: Secondary | ICD-10-CM | POA: Diagnosis not present

## 2023-10-12 DIAGNOSIS — F17210 Nicotine dependence, cigarettes, uncomplicated: Secondary | ICD-10-CM | POA: Diagnosis not present

## 2023-10-12 DIAGNOSIS — E119 Type 2 diabetes mellitus without complications: Secondary | ICD-10-CM | POA: Diagnosis not present

## 2023-10-14 ENCOUNTER — Ambulatory Visit: Admitting: Student in an Organized Health Care Education/Training Program

## 2023-10-14 ENCOUNTER — Encounter: Payer: Self-pay | Admitting: Student in an Organized Health Care Education/Training Program

## 2023-10-14 VITALS — BP 96/70 | HR 97 | Temp 97.6°F | Ht 66.0 in | Wt 200.0 lb

## 2023-10-14 DIAGNOSIS — F17200 Nicotine dependence, unspecified, uncomplicated: Secondary | ICD-10-CM

## 2023-10-14 DIAGNOSIS — R918 Other nonspecific abnormal finding of lung field: Secondary | ICD-10-CM | POA: Diagnosis not present

## 2023-10-14 DIAGNOSIS — F17210 Nicotine dependence, cigarettes, uncomplicated: Secondary | ICD-10-CM

## 2023-10-14 DIAGNOSIS — J449 Chronic obstructive pulmonary disease, unspecified: Secondary | ICD-10-CM | POA: Diagnosis not present

## 2023-10-14 NOTE — Progress Notes (Signed)
 Assessment & Plan:   #Pulmonary nodules (Primary)  Finding of a right middle lobe infiltrate noted on a CT scan of the chest while she was in the ED in August.  There was an associated pleural effusion with this.  A CT scan performed for lung cancer screening in May 2025 did not show any abnormalities in the area of question and was overall benign and within normal.  Today, I performed point-of-care ultrasound which was negative for any pleural effusions or loculations over the left. Her lung exam today does not show any wheezing, rales, or rhonchi and is re-assuring.  I suspect that the infiltrate in the right middle lobe is infectious/inflammatory in nature, and was likely secondary to community acquired pneumonia. The fact that a prior recent low dose CT normal is re-assuring. This was appropriately treated with antibiotics.  I will obtain a repeat CT scan at the 39-month mark to follow up on these findings.  Should this be improved/within normal, she can go back to getting her low-dose CT scans for lung cancer screening.  - CT CHEST WO CONTRAST; Future  #Possible COPD #Tobacco Use Disorder  She was started on ICS/LABA/LAMA therapy for her recurrent respiratory symptoms, with possible COPD vs COPD/Asthma overlap. She is bed bound and it's difficult to evaluate for dyspnea. She is a heavy smoker and was counseled on the importance of smoking cessation.  -Continue Trelegy once daily -follow up with PCP  Return in about 2 months (around 12/14/2023).  I spent 60 minutes caring for this patient today, including preparing to see the patient, obtaining a medical history , reviewing a separately obtained history, performing a medically appropriate examination and/or evaluation, counseling and educating the patient/family/caregiver, ordering medications, tests, or procedures, documenting clinical information in the electronic health record, and independently interpreting results (not separately  reported/billed) and communicating results to the patient/family/caregiver  Deanna November, MD [facility]   End of visit medications:  No orders of the defined types were placed in this encounter.    Current Outpatient Medications:    albuterol  (VENTOLIN  HFA) 108 (90 Base) MCG/ACT inhaler, INHALE 2 INHALATIONS BY MOUTH  EVERY 4 HOURS AS NEEDED FOR  WHEEZING OR FOR SHORTNESS OF  BREATH, Disp: 51 g, Rfl: 1   atorvastatin  (LIPITOR) 40 MG tablet, TAKE 1 TABLET BY MOUTH AT  BEDTIME, Disp: 90 tablet, Rfl: 3   buPROPion  (WELLBUTRIN  XL) 150 MG 24 hr tablet, TAKE 1 TABLET BY MOUTH DAILY, Disp: 100 tablet, Rfl: 1   citalopram  (CELEXA ) 40 MG tablet, TAKE 1 TABLET BY MOUTH DAILY, Disp: 100 tablet, Rfl: 1   clopidogrel  (PLAVIX ) 75 MG tablet, TAKE 1 TABLET BY MOUTH DAILY AT  9AM, Disp: 100 tablet, Rfl: 1   fluticasone  (FLONASE ) 50 MCG/ACT nasal spray, Place 2 sprays into both nostrils daily. Use for 4-6 weeks then stop and use seasonally or as needed., Disp: 16 g, Rfl: 0   gabapentin  (NEURONTIN ) 600 MG tablet, TAKE 1 TABLET BY MOUTH 4 TIMES  DAILY, Disp: 400 tablet, Rfl: 2   leptospermum manuka honey (MEDIHONEY) gel, Apply 1 Application topically daily., Disp: 44 mL, Rfl: 0   methocarbamol  (ROBAXIN ) 500 MG tablet, Take 500 mg by mouth 3 (three) times daily., Disp: , Rfl:    midodrine  (PROAMATINE ) 5 MG tablet, Take 1 tablet (5 mg total) by mouth 3 (three) times daily with meals., Disp: 21 tablet, Rfl: 0   MOUNJARO  10 MG/0.5ML Pen, INJECT THE CONTENTS OF ONE PEN  SUBCUTANEOUSLY WEEKLY  AS  DIRECTED, Disp: 6 mL, Rfl: 3   mupirocin  ointment (BACTROBAN ) 2 %, Apply 1 Application topically 2 (two) times daily. For 10 days, Disp: 22 g, Rfl: 0   nicotine  (NICODERM CQ  - DOSED IN MG/24 HOURS) 14 mg/24hr patch, Place onto the skin., Disp: , Rfl:    nystatin  (MYCOSTATIN /NYSTOP ) powder, Apply 1 Application topically 3 (three) times daily., Disp: 15 g, Rfl: 0   ondansetron  (ZOFRAN -ODT) 4 MG  disintegrating tablet, DISSOLVE 1 TABLET ON THE TONGUE EVERY 8 HOURS AS NEEDED FOR NAUSEA AND VOMITING, Disp: 30 tablet, Rfl: 2   oxyCODONE -acetaminophen  (PERCOCET) 10-325 MG tablet, Take 1 tablet by mouth every 6 (six) hours as needed for pain., Disp: , Rfl:    pantoprazole  (PROTONIX ) 40 MG tablet, TAKE 1 TABLET BY MOUTH DAILY AT  9 AM, Disp: 100 tablet, Rfl: 1   rizatriptan  (MAXALT -MLT) 10 MG disintegrating tablet, Take 1 tablet (10 mg total) by mouth as needed. May repeat in 2 hours if needed, Disp: 10 tablet, Rfl: 2   topiramate  (TOPAMAX ) 100 MG tablet, TAKE 1 TABLET BY MOUTH TWICE  DAILY, Disp: 200 tablet, Rfl: 2   TRELEGY ELLIPTA  100-62.5-25 MCG/ACT AEPB, USE 1 INHALATION BY MOUTH ONCE  DAILY AT THE SAME TIME EACH DAY, Disp: 180 each, Rfl: 3   Vitamin D , Ergocalciferol , (DRISDOL ) 1.25 MG (50000 UNIT) CAPS capsule, TAKE 1 CAPSULE BY MOUTH EVERY WEDNESDAY @9AM , Disp: 4 capsule, Rfl: 11   Subjective:   PATIENT ID: [name] GENDER: female DOB: 01-Aug-1965, MRN: [id number]  Chief Complaint  Patient presents with   COPD    HPI  Pleasant 58 year old female presenting to clinic today for the evaluation of right middle lobe infiltrate.  Patient reports symptoms of increased shortness of breath, cough, and pleuritic chest pain with deep inhalation last month prompting evaluation in the emergency department.  A CT scan was performed which showed a right middle lobe infiltrate with an associated small pleural effusion.  She was prescribed a course of antibiotics and discharged home. She was also recently seen by her PCP and reported symptoms of increased shortness of breath as well as chest discomfort.  She has felt much better with this but continues to have very minimal chest discomfort. She was prescribed Trelegy by her PCP but she forgets to use it often. She was also prescribed smoking cessation aides and similarly hasn't used them consistently.  Patient is bedbound and is unable to  ambulate.  She reports recurrent episodes of bronchitis that occur multiple times a year.  She has had these since her childhood. She was never diagnosed with Asthma.  Patient is a heavy smoker, currently smoking up to 2 packs a day.  Ancillary information including prior medications, full medical/surgical/family/social histories, and PFTs (when available) are listed below and have been reviewed.    Review of Systems  Constitutional:  Positive for malaise/fatigue. Negative for chills, fever and weight loss.  Respiratory:  Positive for cough, shortness of breath and wheezing. Negative for hemoptysis and sputum production.   Cardiovascular:  Negative for chest pain.     Objective:   Vitals:   10/14/23 1420  BP: 96/70  Pulse: 97  Temp: 97.6 F (36.4 C)  TempSrc: Temporal  SpO2: 96%  Weight: 200 lb (90.7 kg)  Height: 5' 6 (1.676 m)   96% on RA  BMI Readings from Last 3 Encounters:  10/14/23 32.28 kg/m  09/28/23 32.02 kg/m  09/18/23 32.02 kg/m   Wt Readings from Last 3 Encounters:  10/14/23 200 lb (90.7 kg)  09/18/23 198 lb 6.6 oz (90 kg)  08/22/23 209 lb (94.8 kg)    Physical Exam Constitutional:      Appearance: Normal appearance.  Cardiovascular:     Rate and Rhythm: Normal rate and regular rhythm.     Pulses: Normal pulses.     Heart sounds: Normal heart sounds.  Pulmonary:     Effort: Pulmonary effort is normal. No respiratory distress.     Breath sounds: No wheezing.  Neurological:     Mental Status: She is alert and oriented to person, place, and time. Mental status is at baseline.       Ancillary Information    Past Medical History:  Diagnosis Date   Anxiety    Cancer (HCC)    a spot on liver and treated    Complication of anesthesia    restless,easily upset   COPD (chronic obstructive pulmonary disease) (HCC)    Depression    Diabetes mellitus without complication (HCC)    Diverticulitis    Fatty liver    GERD (gastroesophageal reflux  disease)    Headache(784.0)    migraines   History of kidney stones    Hyperlipidemia    Hypertension    PAD (peripheral artery disease)    Pneumonia    Restless    Stroke (HCC)      Family History  Problem Relation Age of Onset   Diabetes Mother    Hypertension Mother    Hypertension Father      Past Surgical History:  Procedure Laterality Date   ABDOMINAL HYSTERECTOMY     ACHILLES TENDON LENGTHENING Right 08/18/2017   Procedure: ACHILLES TENDON LENGTHENING;  Surgeon: Celena Sharper, MD;  Location: [facility] OR;  Service: Orthopedics;  Laterality: Right;   AMPUTATION TOE Left 04/02/2023   Procedure: AMPUTATION, TOE;  Surgeon: Malvin Marsa FALCON, DPM;  Location: [facility];  Service: Orthopedics/Podiatry;  Laterality: Left;  LEFT GREAT TOE   ANTERIOR CERVICAL DECOMP/DISCECTOMY FUSION N/A 03/11/2012   Procedure: ANTERIOR CERVICAL DECOMPRESSION/DISCECTOMY FUSION 1 LEVEL;  Surgeon: Reyes JONETTA Budge, MD;  Location: [facility];  Service: Neurosurgery;  Laterality: N/A;  Cervical five-six anterior cervical decompression with fusion interbody prothesis plating and bonegraft   BACK SURGERY     ENDARTERECTOMY FEMORAL Left 04/01/2023   Procedure: ENDARTERECTOMY, FEMORAL;  Surgeon: Jama Cordella MATSU, MD;  Location: [facility];  Service: Vascular;  Laterality: Left;  with Popiteal stent placment   EXTERNAL FIXATION LEG Right 08/10/2017   Procedure: EXTERNAL FIXATION ANKLE;  Surgeon: Mardee Lynwood SQUIBB, MD;  Location: [facility];  Service: Orthopedics;  Laterality: Right;   EXTERNAL FIXATION REMOVAL Right 08/18/2017   Procedure: REMOVAL EXTERNAL FIXATION LEG;  Surgeon: Celena Sharper, MD;  Location: [facility];  Service: Orthopedics;  Laterality: Right;   EYE SURGERY     HERNIA REPAIR     IR FL GUIDED LOC OF NEEDLE/CATH TIP FOR SPINAL INJECTION LT  12/09/2021   IR FL GUIDED LOC OF NEEDLE/CATH TIP FOR SPINAL INJECTION RT  12/09/2021   JOINT REPLACEMENT     bil knees   LOWER EXTREMITY ANGIOGRAPHY Left  03/27/2023   Procedure: Lower Extremity Angiography;  Surgeon: Jama Cordella MATSU, MD;  Location: [facility];  Service: Cardiovascular;  Laterality: Left;   ORIF ANKLE FRACTURE Right 08/18/2017   Procedure: OPEN REDUCTION INTERNAL FIXATION (ORIF) ANKLE FRACTURE;  Surgeon: Celena Sharper, MD;  Location: [facility];  Service: Orthopedics;  Laterality: Right;   REPLACEMENT TOTAL KNEE  BILATERAL      Social History   Socioeconomic History   Marital status: Married    Spouse name: Not on file   Number of children: Not on file   Years of education: Not on file   Highest education level: 10th grade  Occupational History   Not on file  Tobacco Use   Smoking status: Every Day    Current packs/day: 2.00    Average packs/day: 2.0 packs/day for 46.7 years (93.5 ttl pk-yrs)    Types: Cigarettes    Start date: 1979   Smokeless tobacco: Never  Vaping Use   Vaping status: Never Used  Substance and Sexual Activity   Alcohol  use: No   Drug use: Yes    Types: Marijuana, Oxycodone     Comment: last smoked 2 days ago  8/4   Sexual activity: Not on file  Other Topics Concern   Not on file  Social History Narrative   Not on file   Social Drivers of Health   Financial Resource Strain: Low Risk  (05/22/2023)   Overall Financial Resource Strain (CARDIA)    Difficulty of Paying Living Expenses: Not hard at all  Food Insecurity: No Food Insecurity (06/02/2023)   Hunger Vital Sign    Worried About Running Out of Food in the Last Year: Never true    Ran Out of Food in the Last Year: Never true  Transportation Needs: No Transportation Needs (06/02/2023)   PRAPARE - Administrator, Civil Service (Medical): No    Lack of Transportation (Non-Medical): No  Physical Activity: Inactive (05/22/2023)   Exercise Vital Sign    Days of Exercise per Week: 0 days    Minutes of Exercise per Session: 0 min  Stress: No Stress Concern Present (05/22/2023)   [employer] of Occupational Health -  Occupational Stress Questionnaire    Feeling of Stress : Only a little  Social Connections: Moderately Isolated (06/02/2023)   Social Connection and Isolation Panel    Frequency of Communication with Friends and Family: More than three times a week    Frequency of Social Gatherings with Friends and Family: Once a week    Attends Religious Services: Never    Database administrator or Organizations: No    Attends Banker Meetings: Never    Marital Status: Married  Catering manager Violence: Not At Risk (06/02/2023)   Humiliation, Afraid, Rape, and Kick questionnaire    Fear of Current or Ex-Partner: No    Emotionally Abused: No    Physically Abused: No    Sexually Abused: No     Allergies  Allergen Reactions   Tramadol Nausea And Vomiting and Hypertension   Augmentin [Amoxicillin-Pot Clavulanate] Rash    Tolerated Zosyn  03/26/23     CBC    Component Value Date/Time   WBC 10.3 09/18/2023 1309   RBC 4.74 09/18/2023 1309   HGB 13.3 09/18/2023 1309   HGB 14.6 09/22/2013 2251   HCT 40.9 09/18/2023 1309   HCT 44.3 09/22/2013 2251   PLT 391 09/18/2023 1309   PLT 300 09/22/2013 2251   MCV 86.3 09/18/2023 1309   MCV 90 09/22/2013 2251   MCH 28.1 09/18/2023 1309   MCHC 32.5 09/18/2023 1309   RDW 15.3 09/18/2023 1309   RDW 13.8 09/22/2013 2251   LYMPHSABS 3.2 06/29/2023 0356   LYMPHSABS 4.5 (H) 08/02/2013 2118   MONOABS 0.8 06/29/2023 0356   MONOABS 1.3 (H) 08/02/2013 2118   EOSABS 218 07/03/2023 1347  EOSABS 0.1 08/02/2013 2118   BASOSABS 64 07/03/2023 1347   BASOSABS 0.0 08/02/2013 2118    Pulmonary Functions Testing Results:     No data to display          Outpatient Medications Prior to Visit  Medication Sig Dispense Refill   albuterol  (VENTOLIN  HFA) 108 (90 Base) MCG/ACT inhaler INHALE 2 INHALATIONS BY MOUTH  EVERY 4 HOURS AS NEEDED FOR  WHEEZING OR FOR SHORTNESS OF  BREATH 51 g 1   atorvastatin  (LIPITOR) 40 MG tablet TAKE 1 TABLET BY MOUTH AT   BEDTIME 90 tablet 3   buPROPion  (WELLBUTRIN  XL) 150 MG 24 hr tablet TAKE 1 TABLET BY MOUTH DAILY 100 tablet 1   citalopram  (CELEXA ) 40 MG tablet TAKE 1 TABLET BY MOUTH DAILY 100 tablet 1   clopidogrel  (PLAVIX ) 75 MG tablet TAKE 1 TABLET BY MOUTH DAILY AT  9AM 100 tablet 1   fluticasone  (FLONASE ) 50 MCG/ACT nasal spray Place 2 sprays into both nostrils daily. Use for 4-6 weeks then stop and use seasonally or as needed. 16 g 0   gabapentin  (NEURONTIN ) 600 MG tablet TAKE 1 TABLET BY MOUTH 4 TIMES  DAILY 400 tablet 2   leptospermum manuka honey (MEDIHONEY) gel Apply 1 Application topically daily. 44 mL 0   methocarbamol  (ROBAXIN ) 500 MG tablet Take 500 mg by mouth 3 (three) times daily.     midodrine  (PROAMATINE ) 5 MG tablet Take 1 tablet (5 mg total) by mouth 3 (three) times daily with meals. 21 tablet 0   MOUNJARO  10 MG/0.5ML Pen INJECT THE CONTENTS OF ONE PEN  SUBCUTANEOUSLY WEEKLY AS  DIRECTED 6 mL 3   mupirocin  ointment (BACTROBAN ) 2 % Apply 1 Application topically 2 (two) times daily. For 10 days 22 g 0   nicotine  (NICODERM CQ  - DOSED IN MG/24 HOURS) 14 mg/24hr patch Place onto the skin.     nystatin  (MYCOSTATIN /NYSTOP ) powder Apply 1 Application topically 3 (three) times daily. 15 g 0   ondansetron  (ZOFRAN -ODT) 4 MG disintegrating tablet DISSOLVE 1 TABLET ON THE TONGUE EVERY 8 HOURS AS NEEDED FOR NAUSEA AND VOMITING 30 tablet 2   oxyCODONE -acetaminophen  (PERCOCET) 10-325 MG tablet Take 1 tablet by mouth every 6 (six) hours as needed for pain.     pantoprazole  (PROTONIX ) 40 MG tablet TAKE 1 TABLET BY MOUTH DAILY AT  9 AM 100 tablet 1   rizatriptan  (MAXALT -MLT) 10 MG disintegrating tablet Take 1 tablet (10 mg total) by mouth as needed. May repeat in 2 hours if needed 10 tablet 2   topiramate  (TOPAMAX ) 100 MG tablet TAKE 1 TABLET BY MOUTH TWICE  DAILY 200 tablet 2   TRELEGY ELLIPTA  100-62.5-25 MCG/ACT AEPB USE 1 INHALATION BY MOUTH ONCE  DAILY AT THE SAME TIME EACH DAY 180 each 3   Vitamin D ,  Ergocalciferol , (DRISDOL ) 1.25 MG (50000 UNIT) CAPS capsule TAKE 1 CAPSULE BY MOUTH EVERY WEDNESDAY @9AM  4 capsule 11   levofloxacin  (LEVAQUIN ) 500 MG tablet Take 1 tablet (500 mg total) by mouth daily. For 7 days 7 tablet 0   No facility-administered medications prior to visit.

## 2023-10-19 ENCOUNTER — Emergency Department

## 2023-10-19 ENCOUNTER — Observation Stay
Admission: EM | Admit: 2023-10-19 | Discharge: 2023-10-19 | Disposition: A | Discharge status: Home / Self Care | Attending: Internal Medicine | Admitting: Internal Medicine

## 2023-10-19 ENCOUNTER — Observation Stay

## 2023-10-19 ENCOUNTER — Other Ambulatory Visit: Payer: Self-pay

## 2023-10-19 DIAGNOSIS — I951 Orthostatic hypotension: Secondary | ICD-10-CM | POA: Diagnosis not present

## 2023-10-19 DIAGNOSIS — K30 Functional dyspepsia: Secondary | ICD-10-CM | POA: Diagnosis not present

## 2023-10-19 DIAGNOSIS — R1010 Upper abdominal pain, unspecified: Principal | ICD-10-CM | POA: Insufficient documentation

## 2023-10-19 DIAGNOSIS — J984 Other disorders of lung: Secondary | ICD-10-CM | POA: Diagnosis not present

## 2023-10-19 DIAGNOSIS — Z79899 Other long term (current) drug therapy: Secondary | ICD-10-CM | POA: Insufficient documentation

## 2023-10-19 DIAGNOSIS — D735 Infarction of spleen: Secondary | ICD-10-CM | POA: Diagnosis not present

## 2023-10-19 DIAGNOSIS — R918 Other nonspecific abnormal finding of lung field: Secondary | ICD-10-CM | POA: Diagnosis not present

## 2023-10-19 DIAGNOSIS — F17210 Nicotine dependence, cigarettes, uncomplicated: Secondary | ICD-10-CM | POA: Insufficient documentation

## 2023-10-19 DIAGNOSIS — K449 Diaphragmatic hernia without obstruction or gangrene: Secondary | ICD-10-CM | POA: Diagnosis not present

## 2023-10-19 DIAGNOSIS — E119 Type 2 diabetes mellitus without complications: Secondary | ICD-10-CM | POA: Diagnosis not present

## 2023-10-19 DIAGNOSIS — I10 Essential (primary) hypertension: Secondary | ICD-10-CM | POA: Diagnosis not present

## 2023-10-19 DIAGNOSIS — I69351 Hemiplegia and hemiparesis following cerebral infarction affecting right dominant side: Secondary | ICD-10-CM | POA: Insufficient documentation

## 2023-10-19 DIAGNOSIS — R110 Nausea: Secondary | ICD-10-CM | POA: Diagnosis not present

## 2023-10-19 DIAGNOSIS — R112 Nausea with vomiting, unspecified: Secondary | ICD-10-CM | POA: Diagnosis not present

## 2023-10-19 DIAGNOSIS — Z7902 Long term (current) use of antithrombotics/antiplatelets: Secondary | ICD-10-CM | POA: Diagnosis not present

## 2023-10-19 DIAGNOSIS — D7389 Other diseases of spleen: Secondary | ICD-10-CM

## 2023-10-19 DIAGNOSIS — J449 Chronic obstructive pulmonary disease, unspecified: Secondary | ICD-10-CM | POA: Insufficient documentation

## 2023-10-19 DIAGNOSIS — R0789 Other chest pain: Secondary | ICD-10-CM | POA: Diagnosis not present

## 2023-10-19 DIAGNOSIS — R079 Chest pain, unspecified: Secondary | ICD-10-CM | POA: Diagnosis not present

## 2023-10-19 DIAGNOSIS — R42 Dizziness and giddiness: Secondary | ICD-10-CM | POA: Diagnosis not present

## 2023-10-19 DIAGNOSIS — F1290 Cannabis use, unspecified, uncomplicated: Secondary | ICD-10-CM | POA: Insufficient documentation

## 2023-10-19 DIAGNOSIS — R109 Unspecified abdominal pain: Secondary | ICD-10-CM | POA: Diagnosis present

## 2023-10-19 DIAGNOSIS — R1012 Left upper quadrant pain: Secondary | ICD-10-CM | POA: Diagnosis not present

## 2023-10-19 LAB — CBC WITH DIFFERENTIAL/PLATELET
Abs Immature Granulocytes: 0.04 K/uL (ref 0.00–0.07)
Basophils Absolute: 0 K/uL (ref 0.0–0.1)
Basophils Relative: 0 %
Eosinophils Absolute: 0 K/uL (ref 0.0–0.5)
Eosinophils Relative: 0 %
HCT: 42 % (ref 36.0–46.0)
Hemoglobin: 13.7 g/dL (ref 12.0–15.0)
Immature Granulocytes: 0 %
Lymphocytes Relative: 18 %
Lymphs Abs: 2.1 K/uL (ref 0.7–4.0)
MCH: 28 pg (ref 26.0–34.0)
MCHC: 32.6 g/dL (ref 30.0–36.0)
MCV: 85.9 fL (ref 80.0–100.0)
Monocytes Absolute: 0.4 K/uL (ref 0.1–1.0)
Monocytes Relative: 4 %
Neutro Abs: 8.8 K/uL — ABNORMAL HIGH (ref 1.7–7.7)
Neutrophils Relative %: 78 %
Platelets: 354 K/uL (ref 150–400)
RBC: 4.89 MIL/uL (ref 3.87–5.11)
RDW: 14.7 % (ref 11.5–15.5)
WBC: 11.4 K/uL — ABNORMAL HIGH (ref 4.0–10.5)
nRBC: 0 % (ref 0.0–0.2)

## 2023-10-19 LAB — COMPREHENSIVE METABOLIC PANEL WITH GFR
ALT: 11 U/L (ref 0–44)
AST: 19 U/L (ref 15–41)
Albumin: 3.5 g/dL (ref 3.5–5.0)
Alkaline Phosphatase: 64 U/L (ref 38–126)
Anion gap: 15 (ref 5–15)
BUN: 12 mg/dL (ref 6–20)
CO2: 23 mmol/L (ref 22–32)
Calcium: 9.3 mg/dL (ref 8.9–10.3)
Chloride: 98 mmol/L (ref 98–111)
Creatinine, Ser: 0.55 mg/dL (ref 0.44–1.00)
GFR, Estimated: >60 mL/min (ref >60)
Glucose, Bld: 156 mg/dL — ABNORMAL HIGH (ref 70–99)
Potassium: 4.1 mmol/L (ref 3.5–5.1)
Sodium: 136 mmol/L (ref 135–145)
Total Bilirubin: 0.5 mg/dL (ref 0.0–1.2)
Total Protein: 7.4 g/dL (ref 6.5–8.1)

## 2023-10-19 LAB — PROTIME-INR
INR: 1 (ref 0.8–1.2)
Prothrombin Time: 14.1 s (ref 11.4–15.2)

## 2023-10-19 LAB — RESP PANEL BY RT-PCR (RSV, FLU A&B, COVID)  RVPGX2
Influenza A by PCR: NEGATIVE
Influenza B by PCR: NEGATIVE
Resp Syncytial Virus by PCR: NEGATIVE
SARS Coronavirus 2 by RT PCR: NEGATIVE

## 2023-10-19 LAB — HEPARIN LEVEL (UNFRACTIONATED): Heparin Unfractionated: 0.3 [IU]/mL (ref 0.30–0.70)

## 2023-10-19 LAB — MAGNESIUM: Magnesium: 1.8 mg/dL (ref 1.7–2.4)

## 2023-10-19 LAB — APTT: aPTT: 29 s (ref 24–36)

## 2023-10-19 LAB — TROPONIN I (HIGH SENSITIVITY)
Troponin I (High Sensitivity): 4 ng/L (ref <18)
Troponin I (High Sensitivity): 4 ng/L (ref <18)

## 2023-10-19 MED ORDER — SODIUM CHLORIDE 0.9 % IV BOLUS: 500.0000 mL | Freq: Once | INTRAVENOUS | Status: DC

## 2023-10-19 MED ORDER — BUDESON-GLYCOPYRROL-FORMOTEROL 160-9-4.8 MCG/ACT IN AERO
2.0000 | INHALATION_SPRAY | Freq: Two times a day (BID) | RESPIRATORY_TRACT | Status: DC
Start: 2023-10-19 — End: 2023-10-20
  Administered 2023-10-20: 2 via RESPIRATORY_TRACT
  Filled 2023-10-19 (×3): qty 5.9

## 2023-10-19 MED ORDER — ONDANSETRON HCL 4 MG PO TABS: 4.0000 mg | ORAL_TABLET | Freq: Four times a day (QID) | ORAL | Status: DC | PRN

## 2023-10-19 MED ORDER — ONDANSETRON HCL 4 MG/2ML IJ SOLN
4.0000 mg | Freq: Once | INTRAMUSCULAR | Status: AC
  Administered 2023-10-19: 4 mg via INTRAVENOUS
  Filled 2023-10-19: qty 2

## 2023-10-19 MED ORDER — SUMATRIPTAN SUCCINATE 50 MG PO TABS: 50.0000 mg | ORAL_TABLET | ORAL | Status: DC | PRN

## 2023-10-19 MED ORDER — HEPARIN BOLUS VIA INFUSION
5000.0000 [IU] | Freq: Once | INTRAVENOUS | Status: AC
  Administered 2023-10-19: 5000 [IU] via INTRAVENOUS
  Filled 2023-10-19: qty 5000

## 2023-10-19 MED ORDER — ATORVASTATIN CALCIUM 20 MG PO TABS
40.0000 mg | ORAL_TABLET | Freq: Every day | ORAL | Status: DC
  Administered 2023-10-19: 40 mg via ORAL
  Filled 2023-10-19: qty 2

## 2023-10-19 MED ORDER — FLUTICASONE PROPIONATE 50 MCG/ACT NA SUSP: 2.0000 | Freq: Every day | NASAL | Status: DC | PRN

## 2023-10-19 MED ORDER — PANTOPRAZOLE SODIUM 40 MG PO TBEC
40.0000 mg | DELAYED_RELEASE_TABLET | Freq: Every day | ORAL | Status: DC
  Administered 2023-10-19 – 2023-10-20 (×2): 40 mg via ORAL
  Filled 2023-10-19 (×2): qty 1

## 2023-10-19 MED ORDER — METHOCARBAMOL 500 MG PO TABS
500.0000 mg | ORAL_TABLET | Freq: Three times a day (TID) | ORAL | Status: DC
Start: 2023-10-19 — End: 2023-10-19

## 2023-10-19 MED ORDER — ACETAMINOPHEN 325 MG PO TABS: 650.0000 mg | ORAL_TABLET | Freq: Four times a day (QID) | ORAL | Status: DC | PRN

## 2023-10-19 MED ORDER — IOHEXOL 300 MG/ML  SOLN
100.0000 mL | Freq: Once | INTRAMUSCULAR | Status: AC | PRN
  Administered 2023-10-19: 100 mL via INTRAVENOUS

## 2023-10-19 MED ORDER — ALBUTEROL SULFATE (2.5 MG/3ML) 0.083% IN NEBU: 2.5000 mg | INHALATION_SOLUTION | Freq: Four times a day (QID) | RESPIRATORY_TRACT | Status: DC | PRN

## 2023-10-19 MED ORDER — OXYCODONE HCL 5 MG PO TABS: 10.0000 mg | ORAL_TABLET | Freq: Four times a day (QID) | ORAL | Status: DC | PRN

## 2023-10-19 MED ORDER — MIDODRINE HCL 5 MG PO TABS
5.0000 mg | ORAL_TABLET | Freq: Three times a day (TID) | ORAL | Status: DC
  Administered 2023-10-19 – 2023-10-20 (×4): 5 mg via ORAL
  Filled 2023-10-19 (×4): qty 1

## 2023-10-19 MED ORDER — SODIUM CHLORIDE 0.9 % IV BOLUS
1000.0000 mL | Freq: Once | INTRAVENOUS | Status: AC
  Administered 2023-10-19: 1000 mL via INTRAVENOUS

## 2023-10-19 MED ORDER — OXYCODONE HCL 5 MG PO TABS
10.0000 mg | ORAL_TABLET | ORAL | Status: DC | PRN
  Administered 2023-10-19 – 2023-10-20 (×5): 10 mg via ORAL
  Filled 2023-10-19 (×5): qty 2

## 2023-10-19 MED ORDER — OXYCODONE-ACETAMINOPHEN 10-325 MG PO TABS: 1.0000 | ORAL_TABLET | Freq: Four times a day (QID) | ORAL | Status: DC | PRN

## 2023-10-19 MED ORDER — HYDROMORPHONE HCL 1 MG/ML IJ SOLN
1.0000 mg | INTRAMUSCULAR | Status: DC | PRN
  Administered 2023-10-19 – 2023-10-20 (×3): 1 mg via INTRAVENOUS
  Filled 2023-10-19 (×3): qty 1

## 2023-10-19 MED ORDER — TOPIRAMATE 100 MG PO TABS
100.0000 mg | ORAL_TABLET | Freq: Two times a day (BID) | ORAL | Status: DC
Start: 2023-10-19 — End: 2023-10-19

## 2023-10-19 MED ORDER — HEPARIN (PORCINE) 25000 UT/250ML-% IV SOLN
1350.0000 [IU]/h | INTRAVENOUS | Status: DC
  Administered 2023-10-19 (×2): 1350 [IU]/h via INTRAVENOUS
  Filled 2023-10-19 (×2): qty 250

## 2023-10-19 MED ORDER — CLOPIDOGREL BISULFATE 75 MG PO TABS
75.0000 mg | ORAL_TABLET | Freq: Every day | ORAL | Status: DC
Start: 2023-10-19 — End: 2023-10-20
  Administered 2023-10-19 – 2023-10-20 (×2): 75 mg via ORAL
  Filled 2023-10-19 (×2): qty 1

## 2023-10-19 MED ORDER — OXYCODONE-ACETAMINOPHEN 5-325 MG PO TABS
1.0000 | ORAL_TABLET | Freq: Four times a day (QID) | ORAL | Status: DC | PRN
  Administered 2023-10-19: 1 via ORAL
  Filled 2023-10-19: qty 1

## 2023-10-19 MED ORDER — QUETIAPINE FUMARATE 25 MG PO TABS
100.0000 mg | ORAL_TABLET | Freq: Every day | ORAL | Status: DC
Start: 2023-10-19 — End: 2023-10-19

## 2023-10-19 MED ORDER — HYDROMORPHONE HCL 1 MG/ML IJ SOLN
0.5000 mg | INTRAMUSCULAR | Status: DC | PRN
  Administered 2023-10-19 (×3): 0.5 mg via INTRAVENOUS
  Filled 2023-10-19 (×3): qty 0.5

## 2023-10-19 MED ORDER — MORPHINE SULFATE (PF) 4 MG/ML IV SOLN
4.0000 mg | Freq: Once | INTRAVENOUS | Status: AC
  Administered 2023-10-19: 4 mg via INTRAVENOUS
  Filled 2023-10-19: qty 1

## 2023-10-19 MED ORDER — ONDANSETRON HCL 4 MG/2ML IJ SOLN: 4.0000 mg | Freq: Four times a day (QID) | INTRAMUSCULAR | Status: DC | PRN

## 2023-10-19 MED ORDER — OXYCODONE HCL 5 MG PO TABS: 5.0000 mg | ORAL_TABLET | Freq: Four times a day (QID) | ORAL | Status: DC | PRN

## 2023-10-19 MED ORDER — OXYCODONE HCL 5 MG PO TABS
10.0000 mg | ORAL_TABLET | Freq: Once | ORAL | Status: AC
  Administered 2023-10-19: 10 mg via ORAL
  Filled 2023-10-19: qty 2

## 2023-10-19 MED ORDER — GABAPENTIN 300 MG PO CAPS
600.0000 mg | ORAL_CAPSULE | Freq: Four times a day (QID) | ORAL | Status: DC
Start: 2023-10-19 — End: 2023-10-20
  Administered 2023-10-19 – 2023-10-20 (×6): 600 mg via ORAL
  Filled 2023-10-19 (×6): qty 2

## 2023-10-19 MED ORDER — CITALOPRAM HYDROBROMIDE 20 MG PO TABS
40.0000 mg | ORAL_TABLET | Freq: Every day | ORAL | Status: DC
Start: 2023-10-19 — End: 2023-10-20
  Administered 2023-10-19 – 2023-10-20 (×2): 40 mg via ORAL
  Filled 2023-10-19 (×2): qty 2

## 2023-10-19 MED ORDER — NICOTINE 14 MG/24HR TD PT24
14.0000 mg | MEDICATED_PATCH | Freq: Every day | TRANSDERMAL | Status: DC
  Administered 2023-10-19 – 2023-10-20 (×2): 14 mg via TRANSDERMAL
  Filled 2023-10-19 (×2): qty 1

## 2023-10-19 MED ORDER — ACETAMINOPHEN 650 MG RE SUPP: 650.0000 mg | Freq: Four times a day (QID) | RECTAL | Status: DC | PRN

## 2023-10-19 MED ORDER — ASPIRIN 81 MG PO CHEW
324.0000 mg | CHEWABLE_TABLET | Freq: Once | ORAL | Status: AC
Start: 2023-10-19 — End: 2023-10-19
  Administered 2023-10-19: 324 mg via ORAL
  Filled 2023-10-19: qty 4

## 2023-10-19 MED ORDER — BUPROPION HCL ER (XL) 150 MG PO TB24
150.0000 mg | ORAL_TABLET | Freq: Every day | ORAL | Status: DC
Start: 2023-10-19 — End: 2023-10-19
  Administered 2023-10-19: 150 mg via ORAL
  Filled 2023-10-19: qty 1

## 2023-10-19 MED ORDER — OXYCODONE HCL 5 MG PO TABS
5.0000 mg | ORAL_TABLET | Freq: Once | ORAL | Status: AC
  Administered 2023-10-19: 5 mg via ORAL
  Filled 2023-10-19: qty 1

## 2023-10-19 NOTE — Progress Notes (Signed)
 Notified MD about patient arriving to the unit

## 2023-10-19 NOTE — ED Provider Notes (Addendum)
 [facility] Provider Note    Event Date/Time   First MD Initiated Contact with Patient 10/19/23 [phone]     (approximate)   History   Nausea, Dizziness, and Chest Pain   HPI  [name] is a 58 y.o. female   Past medical history of diabetes, COPD, hypertension hyperlipidemia, PAD, presents emerged department with upper abdominal pain nausea and vomiting acute onset in the evening time last night.  No trauma sustained, no GI bleeding.  No chest pain or shortness of breath & no respiratory infectious symptoms.  Bowel movements normal, no urinary symptoms.   External Medical Documents Reviewed: Previous outpatient notes      Physical Exam   Triage Vital Signs: ED Triage Vitals [10/19/23 0326]  Encounter Vitals Group     BP (!) 147/87     Girls Systolic BP Percentile      Girls Diastolic BP Percentile      Boys Systolic BP Percentile      Boys Diastolic BP Percentile      Pulse Rate (!) 101     Resp 18     Temp 98.5 F (36.9 C)     Temp Source Oral     SpO2 100 %     Weight      Height      Head Circumference      Peak Flow      Pain Score      Pain Loc      Pain Education      Exclude from Growth Chart     Most recent vital signs: Vitals:   10/19/23 0326  BP: (!) 147/87  Pulse: (!) 101  Resp: 18  Temp: 98.5 F (36.9 C)  SpO2: 100%    General: Awake, no distress.  CV:  Good peripheral perfusion.  Resp:  Normal effort.  Abd:  No distention.  Other:  Mild epigastric/left upper quadrant and periumbilical tenderness palpation without rigidity or guarding.   ED Results / Procedures / Treatments   Labs (all labs ordered are listed, but only abnormal results are displayed) Labs Reviewed  COMPREHENSIVE METABOLIC PANEL WITH GFR - Abnormal; Notable for the following components:      Result Value   Glucose, Bld 156 (*)    All other components within normal limits  CBC WITH DIFFERENTIAL/PLATELET - Abnormal; Notable for the  following components:   WBC 11.4 (*)    Neutro Abs 8.8 (*)    All other components within normal limits  RESP PANEL BY RT-PCR (RSV, FLU A&B, COVID)  [id number]  MAGNESIUM   TROPONIN I (HIGH SENSITIVITY)  TROPONIN I (HIGH SENSITIVITY)     I ordered and reviewed the above labs they are notable for white blood cell count is 11.4, H&H normal.  EKG  ED ECG REPORT I, Ginnie Shams, the attending physician, personally viewed and interpreted this ECG.   Date: 10/19/2023  EKG Time: 0304  Rate: 101  Rhythm: sinus tachycardia  Axis: nl  Intervals:nl  ST&T Change: no stemi    RADIOLOGY I independently reviewed and interpreted CXR and see no obvious focal consolidation or pneumothorax I also reviewed radiologist's formal read.   PROCEDURES:  Critical Care performed: No  Procedures   MEDICATIONS ORDERED IN ED: Medications  aspirin  chewable tablet 324 mg (324 mg Oral Given 10/19/23 0336)  ondansetron  (ZOFRAN ) injection 4 mg (4 mg Intravenous Given 10/19/23 0336)  sodium chloride  0.9 % bolus 1,000 mL (1,000 mLs Intravenous New Bag/Given  10/19/23 0336)  morphine  (PF) 4 MG/ML injection 4 mg (4 mg Intravenous Given 10/19/23 0340)  oxyCODONE  (Oxy IR/ROXICODONE ) immediate release tablet 10 mg (10 mg Oral Given 10/19/23 0404)  iohexol  (OMNIPAQUE ) 300 MG/ML solution 100 mL (100 mLs Intravenous Contrast Given 10/19/23 0420)    External physician / consultants:  I spoke with Dr. Jordis gen surg Regarding care plan for this patient.   IMPRESSION / MDM / ASSESSMENT AND PLAN / ED COURSE  I reviewed the triage vital signs and the nursing notes.                                Patient's presentation is most consistent with acute presentation with potential threat to life or bodily function.  Differential diagnosis includes, but is not limited to, intra-abdominal infection, obstruction, gastroenteritis, adverse effect of antibiotic therapy   The patient is on the cardiac monitor to evaluate for  evidence of arrhythmia and/or significant heart rate changes.  MDM:     Acute onset abdominal pain with nausea and vomiting since last night considered adverse effect of antibiotic therapy, gastroenteritis, as she had recently finished antibiotics for pneumonia.  CAT scan revealed acute abnormality of spleen, radiology reports as either a grade 2 laceration versus infarct.  No trauma per patient report.  Normal H&H, normotensive, if it is indeed a laceration to be quite curious given no trauma and certainly does not require blood transfusion at this time given normal H&H and stable blood vital signs.  Consulted with Dr. Jordis who notes no acute surgical intervention needed at this time.  I thought about anticoagulation for possible infarct but since laceration of the spleen was also in the differential diagnosis, I did not start this medication for fear of worsening of a possible laceration/bleed.  Given her ongoing discomfort in the area of concern, plan will be for hospital admission for observation/surgical consult in the morning.       FINAL CLINICAL IMPRESSION(S) / ED DIAGNOSES   Final diagnoses:  Pain of upper abdomen  Splenic lesion     Rx / DC Orders   ED Discharge Orders     None        Note:  This document was prepared using Dragon voice recognition software and may include unintentional dictation errors.    Cyrena Mylar, MD 10/19/23 9380    Cyrena Mylar, MD 10/19/23 [phone]

## 2023-10-19 NOTE — Progress Notes (Addendum)
 PHARMACY - ANTICOAGULATION CONSULT NOTE  Pharmacy Consult for Heparin  drip Indication: VTE treatment/ ?splenic infarct  Allergies  Allergen Reactions   Tramadol Nausea And Vomiting and Hypertension   Augmentin [Amoxicillin-Pot Clavulanate] Rash    Tolerated Zosyn  03/26/23    Patient Measurements: Height: 5' 6 (167.6 cm) Weight: 90.7 kg (200 lb) IBW/kg (Calculated) : 59.3 HEPARIN  DW (KG): 79.1  Vital Signs: Temp: 98.8 F (37.1 C) (10/06 0816) Temp Source: Oral (10/06 0326) BP: 133/74 (10/06 0816) Pulse Rate: 104 (10/06 0816)  Labs: Recent Labs    10/19/23 0318 10/19/23 0501  HGB 13.7  --   HCT 42.0  --   PLT 354  --   CREATININE 0.55  --   TROPONINIHS 4 4    Estimated Creatinine Clearance: 84.9 mL/min (by C-G formula based on SCr of 0.55 mg/dL).   Medical History: Past Medical History:  Diagnosis Date   Anxiety    Cancer (HCC)    a spot on liver and treated    Complication of anesthesia    restless,easily upset   COPD (chronic obstructive pulmonary disease) (HCC)    Depression    Diabetes mellitus without complication (HCC)    Diverticulitis    Fatty liver    GERD (gastroesophageal reflux disease)    Headache(784.0)    migraines   History of kidney stones    Hyperlipidemia    Hypertension    PAD (peripheral artery disease)    Pneumonia    Restless    Stroke (HCC)     Medications:  Medications Prior to Admission  Medication Sig Dispense Refill Last Dose/Taking   naloxone  (NARCAN ) nasal spray 4 mg/0.1 mL Place 1 spray into the nose once.   Taking   QUEtiapine  (SEROQUEL ) 100 MG tablet Take 100 mg by mouth at bedtime.   Taking   albuterol  (VENTOLIN  HFA) 108 (90 Base) MCG/ACT inhaler INHALE 2 INHALATIONS BY MOUTH  EVERY 4 HOURS AS NEEDED FOR  WHEEZING OR FOR SHORTNESS OF  BREATH 51 g 1    atorvastatin  (LIPITOR) 40 MG tablet TAKE 1 TABLET BY MOUTH AT  BEDTIME 90 tablet 3    buPROPion  (WELLBUTRIN  XL) 150 MG 24 hr tablet TAKE 1 TABLET BY MOUTH DAILY 100  tablet 1    citalopram  (CELEXA ) 40 MG tablet TAKE 1 TABLET BY MOUTH DAILY 100 tablet 1    clopidogrel  (PLAVIX ) 75 MG tablet TAKE 1 TABLET BY MOUTH DAILY AT  9AM 100 tablet 1    fluticasone  (FLONASE ) 50 MCG/ACT nasal spray Place 2 sprays into both nostrils daily. Use for 4-6 weeks then stop and use seasonally or as needed. 16 g 0    gabapentin  (NEURONTIN ) 600 MG tablet TAKE 1 TABLET BY MOUTH 4 TIMES  DAILY 400 tablet 2    leptospermum manuka honey (MEDIHONEY) gel Apply 1 Application topically daily. 44 mL 0    methocarbamol  (ROBAXIN ) 500 MG tablet Take 500 mg by mouth 3 (three) times daily.      midodrine  (PROAMATINE ) 5 MG tablet Take 1 tablet (5 mg total) by mouth 3 (three) times daily with meals. 21 tablet 0    MOUNJARO  10 MG/0.5ML Pen INJECT THE CONTENTS OF ONE PEN  SUBCUTANEOUSLY WEEKLY AS  DIRECTED 6 mL 3    mupirocin  ointment (BACTROBAN ) 2 % Apply 1 Application topically 2 (two) times daily. For 10 days 22 g 0    nicotine  (NICODERM CQ  - DOSED IN MG/24 HOURS) 14 mg/24hr patch Place onto the skin.  nystatin  (MYCOSTATIN /NYSTOP ) powder Apply 1 Application topically 3 (three) times daily. 15 g 0    ondansetron  (ZOFRAN -ODT) 4 MG disintegrating tablet DISSOLVE 1 TABLET ON THE TONGUE EVERY 8 HOURS AS NEEDED FOR NAUSEA AND VOMITING 30 tablet 2    oxyCODONE -acetaminophen  (PERCOCET) 10-325 MG tablet Take 1 tablet by mouth every 6 (six) hours as needed for pain.      pantoprazole  (PROTONIX ) 40 MG tablet TAKE 1 TABLET BY MOUTH DAILY AT  9 AM 100 tablet 1    rizatriptan  (MAXALT -MLT) 10 MG disintegrating tablet Take 1 tablet (10 mg total) by mouth as needed. May repeat in 2 hours if needed 10 tablet 2    topiramate  (TOPAMAX ) 100 MG tablet TAKE 1 TABLET BY MOUTH TWICE  DAILY 200 tablet 2    TRELEGY ELLIPTA  100-62.5-25 MCG/ACT AEPB USE 1 INHALATION BY MOUTH ONCE  DAILY AT THE SAME TIME EACH DAY 180 each 3    Vitamin D , Ergocalciferol , (DRISDOL ) 1.25 MG (50000 UNIT) CAPS capsule TAKE 1 CAPSULE BY MOUTH  EVERY WEDNESDAY @9AM  4 capsule 11    Scheduled:   atorvastatin   40 mg Oral QHS   budesonide -glycopyrrolate -formoterol   2 puff Inhalation BID   buPROPion   150 mg Oral Daily   citalopram   40 mg Oral Daily   clopidogrel   75 mg Oral Daily   fluticasone   2 spray Each Nare Daily   gabapentin   600 mg Oral QID   heparin   5,000 Units Intravenous Once   methocarbamol   500 mg Oral TID   midodrine   5 mg Oral TID WC   nicotine   14 mg Transdermal Daily   pantoprazole   40 mg Oral Daily   QUEtiapine   100 mg Oral QHS   topiramate   100 mg Oral BID   Infusions:   heparin       Assessment: 58 y.o. female presented to [facility] ED overnight for evaluation of abdominal pain to start heparin  drip for VTE treatment.  Yk:nazdpub, CAD, DM, HTN, HLD, PAD, and CVA. She is on Plavix .  CT Abdomen/Pelvis was concerning for possible splenic infarct vs laceration(favor infarct-per surgery)  Baseline Hgb 13.7   Plt 354   aPTT 29 INR 1.0  Goal of Therapy:  Heparin  level 0.3-0.7 units/ml Monitor platelets by anticoagulation protocol: Yes   Plan:  Give 5000 units bolus x 1 Start heparin  infusion at 1350 units/hr Check anti-Xa level in 6 hours and daily while on heparin  Continue to monitor H&H and platelets  Allean Haas PharmD Clinical Pharmacist 10/19/2023

## 2023-10-19 NOTE — ED Notes (Signed)
 Messaged attending regarding patients request for pain medicine.

## 2023-10-19 NOTE — ED Triage Notes (Signed)
 Pt arrived by ems d/t N/V / dizziness / and minor complaint of CP. Per pt, symptoms began tonight and woke up to N/V. Per ems, pt had stroke x1 month ago with right sided weakness and was dx with pneumonia x6 wks ago.

## 2023-10-19 NOTE — Evaluation (Addendum)
 Physical Therapy Evaluation Patient Details Name: [name] MRN: [id number] DOB: [birth year] Today's Date: 10/19/2023  History of Present Illness  Pt is a 58 y/o F admitted on 10/19/23 after presenting with c/o acute LUQ abdominal pain x 2 days, associated with N&V. Pt being treated for splenic infarct. PMH: recurrent strokes with chronic residual R sided weakness, HTN, HLD, PVD, DMII, anxiety, CA, COPD, depression, diverticulitis, PAD  Clinical Impression  Pt seen for PT evaluation with pt agreeable. Pt reports she uses hoyer lift for OOB mobility 3x/week to power w/c, returns to bed for peri hygiene, but can bathe herself. Pt then reports she's unsure if information is accurate, notes year is 2023. No family/friends present to verify. Pt reports she was receiving HHPT services (working on bed level exercises) up until ~1 week prior to admission, would like to participate in therapy to increase independence. Pt is able to come to sitting EOB on L side of bed with min assist with use of hospital bed features, good use of R side despite chronic weakness. PT provided cuing/assistance for facilitating anterior weight shifting, blocked R knee & foot from sliding but pt declines standing attempt on this date, notes fear of falling (reports falling & breaking ankle ~4 years ago). Pt is mobilizing well & would benefit from ongoing PT services to increase independence in transfers (pt has potential for lateral scoot transfers to w/c). Will continue to follow pt acutely to progress mobility as able.        If plan is discharge home, recommend the following: A lot of help with walking and/or transfers;A lot of help with bathing/dressing/bathroom;Assistance with cooking/housework;Assist for transportation;Help with stairs or ramp for entrance   Can travel by private vehicle        Equipment Recommendations Other (comment) (potentially slide board)  Recommendations for Other Services    OT consult    Functional Status Assessment Patient has had a recent decline in their functional status and demonstrates the ability to make significant improvements in function in a reasonable and predictable amount of time.     Precautions / Restrictions Precautions Precautions: Fall Precaution/Restrictions Comments: chronic R sided weakness Restrictions Weight Bearing Restrictions Per Provider Order: No      Mobility  Bed Mobility Overal bed mobility: Needs Assistance Bed Mobility: Rolling, Supine to Sit, Sit to Supine Rolling: Supervision, Used rails   Supine to sit: HOB elevated, Used rails, Min assist (exit L side of bed) Sit to supine: Contact guard assist, Used rails, HOB elevated        Transfers                        Ambulation/Gait                  Stairs            Wheelchair Mobility     Tilt Bed    Modified Rankin (Stroke Patients Only)       Balance Overall balance assessment: Needs assistance Sitting-balance support: Feet supported Sitting balance-Leahy Scale: Fair Sitting balance - Comments: supervision static sitting                                     Pertinent Vitals/Pain Pain Assessment Pain Assessment: Faces Faces Pain Scale: Hurts even more Pain Location: RUQ abdomen Pain Descriptors / Indicators: Discomfort Pain Intervention(s): Monitored during session, Limited  activity within patient's tolerance    Home Living Family/patient expects to be discharged to:: Private residence Living Arrangements: Spouse/significant other Available Help at Discharge: Family;Available 24 hours/day Type of Home: Mobile home Home Access: Ramped entrance       Home Layout: One level Home Equipment: Wheelchair - power;Wheelchair - manual;Hospital bed;Other (comment) Additional Comments: hoyer lift    Prior Function               Mobility Comments: Pt reports she uses hoyer lift to transfer to power w/c 3x/week,  otherwise stays in bed. Was receiving HHPT prior to admission (reports they finished up ~1 week prior). ADLs Comments: Returns to bed for peri hygiene, does bathe herself (except for her back).     Extremity/Trunk Assessment   Upper Extremity Assessment Upper Extremity Assessment: RUE deficits/detail RUE Deficits / Details: chronic R sided weakness 2/2 hx of CVA    Lower Extremity Assessment Lower Extremity Assessment: RLE deficits/detail RLE Deficits / Details: chronic R sided weakness 2/2 hx of CVA; 2/5 knee extension in sitting, R ankle appears to invert, reports hx of breaking R ankle       Communication   Communication Communication: No apparent difficulties    Cognition Arousal: Alert Behavior During Therapy: Anxious   PT - Cognitive impairments: Orientation, No family/caregiver present to determine baseline, Safety/Judgement, Problem solving   Orientation impairments: Time                   PT - Cognition Comments: Pt reports it's 2023, appears surprised when PT educates her on year being 2025. Following commands: Impaired Following commands impaired: Follows multi-step commands with increased time     Cueing Cueing Techniques: Verbal cues     General Comments      Exercises     Assessment/Plan    PT Assessment Patient needs continued PT services  PT Problem List Decreased coordination;Decreased strength;Pain;Decreased range of motion;Decreased activity tolerance;Decreased balance;Decreased cognition;Decreased safety awareness;Decreased knowledge of use of DME;Decreased mobility;Decreased knowledge of precautions       PT Treatment Interventions DME instruction;Balance training;Gait training;Neuromuscular re-education;Modalities;Patient/family education;Functional mobility training;Therapeutic activities;Therapeutic exercise;Wheelchair mobility training;Cognitive remediation    PT Goals (Current goals can be found in the Care Plan section)  Acute  Rehab PT Goals Patient Stated Goal: do more PT Goal Formulation: With patient Time For Goal Achievement: 11/02/23 Potential to Achieve Goals: Good    Frequency Min 1X/week     Co-evaluation               AM-PAC PT 6 Clicks Mobility  Outcome Measure Help needed turning from your back to your side while in a flat bed without using bedrails?: A Little Help needed moving from lying on your back to sitting on the side of a flat bed without using bedrails?: A Lot Help needed moving to and from a bed to a chair (including a wheelchair)?: Total Help needed standing up from a chair using your arms (e.g., wheelchair or bedside chair)?: Total Help needed to walk in hospital room?: Total Help needed climbing 3-5 steps with a railing? : Total 6 Click Score: 9    End of Session   Activity Tolerance: Patient tolerated treatment well (mobility limited 2/2 anxiety) Patient left: in bed;with call bell/phone within reach;with bed alarm set (bed in chair position) Nurse Communication: Mobility status PT Visit Diagnosis: Muscle weakness (generalized) (M62.81);Other abnormalities of gait and mobility (R26.89);Difficulty in walking, not elsewhere classified (R26.2);Pain Pain - Right/Left: Right Pain - part  of body:  (abdomen)    Time: 8551-8497 PT Time Calculation (min) (ACUTE ONLY): 14 min   Charges:   PT Evaluation $PT Eval Moderate Complexity: 1 Mod   PT General Charges $$ ACUTE PT VISIT: 1 Visit         [name], PT, DPT 10/19/23, 4:01 PM   [name] 10/19/2023, 3:58 PM

## 2023-10-19 NOTE — H&P (Signed)
 History and Physical    [name] FMW:[id number] DOB: 1965/06/13 DOA: 10/19/2023  PCP: Edman Marsa PARAS, DO (Confirm with patient/family/NH records and if not entered, this has to be entered at [facility] point of entry) Patient coming from: Home  I have personally briefly reviewed patient's old medical records in [facility]  Chief Complaint: Belly hurts  HPI: [name] is a 58 y.o. female with medical history significant of recurrent strokes with chronic residual right-sided weakness, HTN, HLD, PVD, IIDM, presented with acute left upper quadrant abdominal pain.  Symptoms started 2 days ago, patient started to have intermittent sharp like left upper quadrant abdominal pain associated with nausea with vomiting of stomach content, she vomited 3 times last day with large amount of stomach content and eventually contained greenish material.  But denied any blood or coffee-ground material.  No diarrhea, no fever or chills.  Yesterday evening the abdominal pain has become more constant and the nature of abdominal pain become more dull like and soreness.  She denied any trauma no fall recently.  ED Course: Afebrile, tachycardia heart rate lower 100s, blood pressure 133/80 nonobstruction 100% on room air.  CT abdomen pelvis with contrast showed 1.7 cm (subcapsular hypodensity in superior pole of the spleen laceration versus splenic infarct.  No free hemorrhage or hematoma associated.  Blood work showed hemoglobin 13.3 WBC 11.4, platelets 356.  Review of Systems: As per HPI otherwise 14 point review of systems negative.    Past Medical History:  Diagnosis Date   Anxiety    Cancer (HCC)    a spot on liver and treated    Complication of anesthesia    restless,easily upset   COPD (chronic obstructive pulmonary disease) (HCC)    Depression    Diabetes mellitus without complication (HCC)    Diverticulitis    Fatty liver    GERD (gastroesophageal reflux disease)    Headache(784.0)     migraines   History of kidney stones    Hyperlipidemia    Hypertension    PAD (peripheral artery disease)    Pneumonia    Restless    Stroke [facility])     Past Surgical History:  Procedure Laterality Date   ABDOMINAL HYSTERECTOMY     ACHILLES TENDON LENGTHENING Right 08/18/2017   Procedure: ACHILLES TENDON LENGTHENING;  Surgeon: Celena Sharper, MD;  Location: [facility] OR;  Service: Orthopedics;  Laterality: Right;   AMPUTATION TOE Left 04/02/2023   Procedure: AMPUTATION, TOE;  Surgeon: Malvin Marsa FALCON, DPM;  Location: [facility] ORS;  Service: Orthopedics/Podiatry;  Laterality: Left;  LEFT GREAT TOE   ANTERIOR CERVICAL DECOMP/DISCECTOMY FUSION N/A 03/11/2012   Procedure: ANTERIOR CERVICAL DECOMPRESSION/DISCECTOMY FUSION 1 LEVEL;  Surgeon: Reyes JONETTA Budge, MD;  Location: [facility] NEURO ORS;  Service: Neurosurgery;  Laterality: N/A;  Cervical five-six anterior cervical decompression with fusion interbody prothesis plating and bonegraft   BACK SURGERY     ENDARTERECTOMY FEMORAL Left 04/01/2023   Procedure: ENDARTERECTOMY, FEMORAL;  Surgeon: Jama Cordella MATSU, MD;  Location: [facility] ORS;  Service: Vascular;  Laterality: Left;  with Popiteal stent placment   EXTERNAL FIXATION LEG Right 08/10/2017   Procedure: EXTERNAL FIXATION ANKLE;  Surgeon: Mardee Lynwood SQUIBB, MD;  Location: [facility] ORS;  Service: Orthopedics;  Laterality: Right;   EXTERNAL FIXATION REMOVAL Right 08/18/2017   Procedure: REMOVAL EXTERNAL FIXATION LEG;  Surgeon: Celena Sharper, MD;  Location: [facility] OR;  Service: Orthopedics;  Laterality: Right;   EYE SURGERY     HERNIA REPAIR  IR FL GUIDED LOC OF NEEDLE/CATH TIP FOR SPINAL INJECTION LT  12/09/2021   IR FL GUIDED LOC OF NEEDLE/CATH TIP FOR SPINAL INJECTION RT  12/09/2021   JOINT REPLACEMENT     bil knees   LOWER EXTREMITY ANGIOGRAPHY Left 03/27/2023   Procedure: Lower Extremity Angiography;  Surgeon: Jama Cordella MATSU, MD;  Location: [facility] INVASIVE CV LAB;  Service: Cardiovascular;  Laterality:  Left;   ORIF ANKLE FRACTURE Right 08/18/2017   Procedure: OPEN REDUCTION INTERNAL FIXATION (ORIF) ANKLE FRACTURE;  Surgeon: Celena Sharper, MD;  Location: [facility] OR;  Service: Orthopedics;  Laterality: Right;   REPLACEMENT TOTAL KNEE BILATERAL       reports that she has been smoking cigarettes. She started smoking about 46 years ago. She has a 93.5 pack-year smoking history. She has never used smokeless tobacco. She reports current drug use. Drugs: Marijuana and Oxycodone . She reports that she does not drink alcohol .  Allergies  Allergen Reactions   Tramadol Nausea And Vomiting and Hypertension   Augmentin [Amoxicillin-Pot Clavulanate] Rash    Tolerated Zosyn  03/26/23    Family History  Problem Relation Age of Onset   Diabetes Mother    Hypertension Mother    Hypertension Father      Prior to Admission medications   Medication Sig Start Date End Date Taking? Authorizing Provider  naloxone  (NARCAN ) nasal spray 4 mg/0.1 mL Place 1 spray into the nose once. 10/05/23  Yes [provider]  QUEtiapine  (SEROQUEL ) 100 MG tablet Take 100 mg by mouth at bedtime. 09/28/23  Yes [provider]  albuterol  (VENTOLIN  HFA) 108 (90 Base) MCG/ACT inhaler INHALE 2 INHALATIONS BY MOUTH  EVERY 4 HOURS AS NEEDED FOR  WHEEZING OR FOR SHORTNESS OF  BREATH 08/28/23   Edman, Marsa PARAS, DO  atorvastatin  (LIPITOR) 40 MG tablet TAKE 1 TABLET BY MOUTH AT  BEDTIME 01/08/23   Karamalegos, Marsa PARAS, DO  buPROPion  (WELLBUTRIN  XL) 150 MG 24 hr tablet TAKE 1 TABLET BY MOUTH DAILY 06/05/23   Edman, Marsa PARAS, DO  citalopram  (CELEXA ) 40 MG tablet TAKE 1 TABLET BY MOUTH DAILY 08/28/23   Edman, Marsa PARAS, DO  clopidogrel  (PLAVIX ) 75 MG tablet TAKE 1 TABLET BY MOUTH DAILY AT  9AM 06/05/23   Karamalegos, Marsa PARAS, DO  fluticasone  (FLONASE ) 50 MCG/ACT nasal spray Place 2 sprays into both nostrils daily. Use for 4-6 weeks then stop and use seasonally or as needed. 03/02/23   Karamalegos,  Marsa PARAS, DO  gabapentin  (NEURONTIN ) 600 MG tablet TAKE 1 TABLET BY MOUTH 4 TIMES  DAILY 06/10/23   Edman, Marsa PARAS, DO  leptospermum manuka honey (MEDIHONEY) gel Apply 1 Application topically daily. 04/28/23   Brown, Fallon E, NP  methocarbamol  (ROBAXIN ) 500 MG tablet Take 500 mg by mouth 3 (three) times daily. 04/07/23   [provider]  midodrine  (PROAMATINE ) 5 MG tablet Take 1 tablet (5 mg total) by mouth 3 (three) times daily with meals. 10/20/22   Darliss Rogue, MD  MOUNJARO  10 MG/0.5ML Pen INJECT THE CONTENTS OF ONE PEN  SUBCUTANEOUSLY WEEKLY AS  DIRECTED 08/11/23   Edman, Alexander J, DO  mupirocin  ointment (BACTROBAN ) 2 % Apply 1 Application topically 2 (two) times daily. For 10 days 03/09/23   Edman Marsa PARAS, DO  nicotine  (NICODERM CQ  - DOSED IN MG/24 HOURS) 14 mg/24hr patch Place onto the skin. 03/16/23   [provider]  nystatin  (MYCOSTATIN /NYSTOP ) powder Apply 1 Application topically 3 (three) times daily. 08/19/23   Moishe Chiquita HERO, NP  ondansetron  (  ZOFRAN -ODT) 4 MG disintegrating tablet DISSOLVE 1 TABLET ON THE TONGUE EVERY 8 HOURS AS NEEDED FOR NAUSEA AND VOMITING 04/10/23   Edman, Marsa PARAS, DO  oxyCODONE -acetaminophen  (PERCOCET) 10-325 MG tablet Take 1 tablet by mouth every 6 (six) hours as needed for pain.    [provider]  pantoprazole  (PROTONIX ) 40 MG tablet TAKE 1 TABLET BY MOUTH DAILY AT  9 AM 06/29/23   Karamalegos, Marsa PARAS, DO  rizatriptan  (MAXALT -MLT) 10 MG disintegrating tablet Take 1 tablet (10 mg total) by mouth as needed. May repeat in 2 hours if needed 06/23/23   Edman Marsa PARAS, DO  topiramate  (TOPAMAX ) 100 MG tablet TAKE 1 TABLET BY MOUTH TWICE  DAILY 10/07/22   Edman, Marsa PARAS, DO  TRELEGY ELLIPTA  100-62.5-25 MCG/ACT AEPB USE 1 INHALATION BY MOUTH ONCE  DAILY AT THE SAME TIME EACH DAY 02/17/23   Karamalegos, Marsa PARAS, DO  Vitamin D , Ergocalciferol , (DRISDOL ) 1.25 MG (50000 UNIT) CAPS  capsule TAKE 1 CAPSULE BY MOUTH EVERY WEDNESDAY @9AM  06/27/22   Edman Marsa PARAS, DO    Physical Exam: Vitals:   10/19/23 0615 10/19/23 0630 10/19/23 0730 10/19/23 0816  BP:  133/89 128/85 133/74  Pulse: 100 (!) 103 (!) 104 (!) 104  Resp: 13 17 15 19   Temp:    98.8 F (37.1 C)  TempSrc:      SpO2: 97% 97% 98% 99%  Weight:      Height:        Constitutional: NAD, calm, comfortable Vitals:   10/19/23 0615 10/19/23 0630 10/19/23 0730 10/19/23 0816  BP:  133/89 128/85 133/74  Pulse: 100 (!) 103 (!) 104 (!) 104  Resp: 13 17 15 19   Temp:    98.8 F (37.1 C)  TempSrc:      SpO2: 97% 97% 98% 99%  Weight:      Height:       Eyes: PERRL, lids and conjunctivae normal ENMT: Mucous membranes are moist. Posterior pharynx clear of any exudate or lesions.Normal dentition.  Neck: normal, supple, no masses, no thyromegaly Respiratory: clear to auscultation bilaterally, no wheezing, no crackles. Normal respiratory effort. No accessory muscle use.  Cardiovascular: Regular rate and rhythm, no murmurs / rubs / gallops. No extremity edema. 2+ pedal pulses. No carotid bruits.  Abdomen: Mild tenderness on left upper quadrant, no rebound no guarding, no masses palpated. No hepatosplenomegaly. Bowel sounds positive.  Musculoskeletal: no clubbing / cyanosis. No joint deformity upper and lower extremities. Good ROM, no contractures. Normal muscle tone.  Skin: no rashes, lesions, ulcers. No induration Neurologic: CN 2-12 grossly intact. Sensation intact, DTR normal.  Chronic right-sided weakness.  Psychiatric: Normal judgment and insight. Alert and oriented x 3. Normal mood.     Labs on Admission: I have personally reviewed following labs and imaging studies  CBC: Recent Labs  Lab 10/19/23 0318  WBC 11.4*  NEUTROABS 8.8*  HGB 13.7  HCT 42.0  MCV 85.9  PLT 354   Basic Metabolic Panel: Recent Labs  Lab 10/19/23 0318  NA 136  K 4.1  CL 98  CO2 23  GLUCOSE 156*  BUN 12   CREATININE 0.55  CALCIUM  9.3  MG 1.8   GFR: Estimated Creatinine Clearance: 84.9 mL/min (by C-G formula based on SCr of 0.55 mg/dL). Liver Function Tests: Recent Labs  Lab 10/19/23 0318  AST 19  ALT 11  ALKPHOS 64  BILITOT 0.5  PROT 7.4  ALBUMIN 3.5   No results for input(s): LIPASE, AMYLASE in the last  168 hours. No results for input(s): AMMONIA in the last 168 hours. Coagulation Profile: No results for input(s): INR, PROTIME in the last 168 hours. Cardiac Enzymes: No results for input(s): CKTOTAL, CKMB, CKMBINDEX, TROPONINI in the last 168 hours. BNP (last 3 results) No results for input(s): PROBNP in the last 8760 hours. HbA1C: No results for input(s): HGBA1C in the last 72 hours. CBG: No results for input(s): GLUCAP in the last 168 hours. Lipid Profile: No results for input(s): CHOL, HDL, LDLCALC, TRIG, CHOLHDL, LDLDIRECT in the last 72 hours. Thyroid  Function Tests: No results for input(s): TSH, T4TOTAL, FREET4, T3FREE, THYROIDAB in the last 72 hours. Anemia Panel: No results for input(s): VITAMINB12, FOLATE, FERRITIN, TIBC, IRON, RETICCTPCT in the last 72 hours. Urine analysis:    Component Value Date/Time   COLORURINE YELLOW (A) 09/18/2023 1544   APPEARANCEUR HAZY (A) 09/18/2023 1544   APPEARANCEUR Clear 09/08/2022 1446   LABSPEC 1.019 09/18/2023 1544   LABSPEC 1.030 09/23/2013 0154   PHURINE 7.0 09/18/2023 1544   GLUCOSEU NEGATIVE 09/18/2023 1544   GLUCOSEU >=500 09/23/2013 0154   HGBUR NEGATIVE 09/18/2023 1544   BILIRUBINUR NEGATIVE 09/18/2023 1544   BILIRUBINUR Negative 09/08/2022 1446   BILIRUBINUR Negative 09/23/2013 0154   KETONESUR NEGATIVE 09/18/2023 1544   PROTEINUR NEGATIVE 09/18/2023 1544   UROBILINOGEN 0.2 04/21/2022 1445   UROBILINOGEN 0.2 03/11/2012 1234   NITRITE NEGATIVE 09/18/2023 1544   LEUKOCYTESUR NEGATIVE 09/18/2023 1544   LEUKOCYTESUR Negative 09/23/2013 0154     Radiological Exams on Admission: CT ABDOMEN PELVIS W CONTRAST Result Date: 10/19/2023 CLINICAL DATA:  Abdominal pain, nausea and vomiting onset tonight. EXAM: CT ABDOMEN AND PELVIS WITH CONTRAST TECHNIQUE: Multidetector CT imaging of the abdomen and pelvis was performed using the standard protocol following bolus administration of intravenous contrast. RADIATION DOSE REDUCTION: This exam was performed according to the departmental dose-optimization program which includes automated exposure control, adjustment of the mA and/or kV according to patient size and/or use of iterative reconstruction technique. CONTRAST:  OMNIPAQUE  IOHEXOL  300 MG/ML  SOLN COMPARISON:  CTA chest 08/22/2023, portable chest 09/18/2023, CTA abdomen and pelvis 08/20/2022 and CT abdomen and pelvis with IV contrast 12/08/2021. FINDINGS: Lower chest: Still seen is a pleural-based consolidation laterally in the right middle lobe measuring 1.5 x 1.6 cm on the sixth image. Two months ago this measured 2.3 x 2.1 cm. Lung bases are otherwise clear with COPD changes. The cardiac size is normal. Small hiatal hernia. Hepatobiliary: The liver is 23 cm length, mildly steatotic without mass. The gallbladder and bile ducts are unremarkable. Pancreas: No abnormality. Spleen: 1.4 cm cyst of the lower medial spleen, Hounsfield density is 20. There is new demonstration of a 1.7 cm in length subcapsular hypodensity in the superior pole of the spleen. This is slightly wedge shaped on the coronal reformatting. This would either represent a grade 2 laceration or a splenic infarct. There is no perisplenic free hemorrhage or hematoma if this is a laceration. The rest of the spleen enhance homogeneously.  No splenomegaly. Adrenals/Urinary Tract: There is new wedge-shaped cortical scarring in the dorsal midpole right kidney. There is no mass enhancement in the adrenal glands and both kidneys. There is no urinary stone or obstruction. There is no bladder  thickening. Stomach/Bowel: Partially contracted stomach. Normal caliber unopacified small bowel. An appendix is not seen. No dilatation or wall thickening of the large bowel. In Vascular/Lymphatic: Aortic atherosclerosis. No enlarged abdominal or pelvic lymph nodes. Reproductive: Status post hysterectomy. No adnexal masses. Other: Interval new scarring  from the skin surface to the vasculature left groin area with new stenting in the proximal left superficial femoral artery. There is rectus diastasis and outward protrusion with intact hernia repair. No incarcerated hernia is seen. There is no free fluid, free hemorrhage or free air. Musculoskeletal: Old posterior fusion hardware L4-5 with right hemilaminectomy. Multilevel degenerative changes with osteopenia. Since the August studies there is a new moderate upper plate anterior wedge compression fracture of the L1 vertebral body with 50% anterior height loss, negligible posterior height loss and 3 mm posterosuperior retropulsion. There is no adjacent soft tissue thickening, suggesting this is probably subacute. No other new osseous findings. IMPRESSION: 1. New demonstration of a 1.7 cm in length subcapsular hypodensity in the superior pole of the spleen. This would either represent a grade 2 laceration or a splenic infarct. There is no perisplenic free hemorrhage or hematoma if this is a laceration. 2. New moderate upper plate anterior wedge compression fracture of the L1 vertebral body with 50% anterior height loss, 3 mm posterosuperior retropulsion. This is probably subacute given the lack of adjacent reactive soft tissue changes, but it is new from 2 months ago. 3. New wedge-shaped cortical scarring in the dorsal midpole right kidney. 4. Aortic atherosclerosis. 5. COPD. 6. Decreased size of a pleural-based consolidation laterally in the right middle lobe, now 1.5 x 1.6 cm, previously 2.3 x 2.1 cm. 7. Small hiatal hernia. 8. Rectus diastasis and outward protrusion  with intact hernia repair over the protruding portion. 9. Interval stenting in the proximal left superficial femoral artery with overlying scarring. Aortic Atherosclerosis (ICD10-I70.0). Electronically Signed   By: Francis Quam M.D.   On: 10/19/2023 05:02   DG Chest 1 View Result Date: 10/19/2023 EXAM: 1 VIEW(S) XRAY OF THE CHEST 10/19/2023 03:51:00 AM COMPARISON: Radiograph of the chest dated 09/18/2023 and CT of the chest dated 08/22/2023. CLINICAL HISTORY: cp. Pt arrived by ems d/t N/V / dizziness / and minor complaint of CP. Per pt, symptoms began tonight and woke up to N/V. Per ems, pt had stroke x1 month ago with right sided weakness and was dx with pneumonia x6 wks ago. FINDINGS: LUNGS AND PLEURA: Vague airspace opacity in right lung base likely represents residue from a wedge-shaped area of opacification/consolidation previously seen within the right middle lobe on the CT of the chest performed 08/22/2023. Continued follow up to ensure resolution is suggested. No pulmonary edema. No pleural effusion. No pneumothorax. HEART AND MEDIASTINUM: No acute abnormality of the cardiac and mediastinal silhouettes. BONES AND SOFT TISSUES: Cervical fusion hardware. IMPRESSION: 1. Vague airspace opacity at the right lung base, likely residual from prior right middle lobe consolidation; recommend follow-up to ensure resolution. Electronically signed by: Evalene Coho MD 10/19/2023 04:12 AM EDT RP Workstation: [id number]    EKG: Independently reviewed.  Sinus tachycardia, no acute ST changes.  Assessment/Plan Principal Problem:   Abdominal pain Active Problems:   Infarction of spleen  (please populate well all problems here in Problem List. (For example, if patient is on BP meds at home and you resume or decide to hold them, it is a problem that needs to be her. 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  Went through patient history showed that patient had a  recurrent stroke May of this year.  That was the second stroke patient had in 15 years.  There was a suspicion of embolic stroke, and patient was on Zio patch monitoring for 2 days  which showed no A-fib.  However cardiology recommended patient has a loop recorder implanted for more of long-term investigation however patient did not follow-up with cardiologist recommendation. - Discussed with surgeon on-call, both surgical team and medical team suspected that patient has splenic infarct at this point.  And the embolic event cannot traced back to her second stroke of posterior brain. - To escalate her therapy, I start patient on heparin  drip, if there is no significant H&H changes and bleeding event, likely can switch to Eliquis on discharge. - No surgical intervention indicated as per surgeon - Other Dx, patient has no trauma or fall recently, low suspicion for spleen laceration. A spleen ultrasound ordered.  History of orthostatic hypotension - Continue midodrine   COPD - No symptoms or signs of acute exacerbation - Continue home regimen of bronchodilators  History of CVA with residual right-sided weakness - Continue Plavix  and statin   Total time spent on patient care 75 minutes.  DVT prophylaxis: Lovenox  Code Status: Full code Family Communication: None at bedside Disposition Plan: Patient with Spanish infarct requiring inpatient surgical consultation and parenteral anticoagulation, expect more than 2 midnight hospital stay Consults called: General Surgeon Admission status: Telemetry admission   Cort ONEIDA Mana MD [facility] Pager [phone]  10/19/2023, 9:54 AM

## 2023-10-19 NOTE — Progress Notes (Signed)
 PHARMACY - ANTICOAGULATION CONSULT NOTE  Pharmacy Consult for Heparin  drip Indication: VTE treatment/ ?splenic infarct  Allergies  Allergen Reactions   Tramadol Nausea And Vomiting and Hypertension   Augmentin [Amoxicillin-Pot Clavulanate] Rash    Tolerated Zosyn  03/26/23    Patient Measurements: Height: 5' 6 (167.6 cm) Weight: 90.7 kg (200 lb) IBW/kg (Calculated) : 59.3 HEPARIN  DW (KG): 79.1  Vital Signs: Temp: 98.5 F (36.9 C) (10/06 1607) Temp Source: Oral (10/06 1607) BP: 113/72 (10/06 1607) Pulse Rate: 93 (10/06 1607)  Labs: Recent Labs    10/19/23 0318 10/19/23 0501 10/19/23 1021 10/19/23 1855  HGB 13.7  --   --   --   HCT 42.0  --   --   --   PLT 354  --   --   --   APTT  --   --  29  --   LABPROT  --   --  14.1  --   INR  --   --  1.0  --   HEPARINUNFRC  --   --   --  0.30  CREATININE 0.55  --   --   --   TROPONINIHS 4 4  --   --     Estimated Creatinine Clearance: 84.9 mL/min (by C-G formula based on SCr of 0.55 mg/dL).   Medical History: Past Medical History:  Diagnosis Date   Anxiety    Cancer (HCC)    a spot on liver and treated    Complication of anesthesia    restless,easily upset   COPD (chronic obstructive pulmonary disease) (HCC)    Depression    Diabetes mellitus without complication (HCC)    Diverticulitis    Fatty liver    GERD (gastroesophageal reflux disease)    Headache(784.0)    migraines   History of kidney stones    Hyperlipidemia    Hypertension    PAD (peripheral artery disease)    Pneumonia    Restless    Stroke (HCC)     Medications:  Medications Prior to Admission  Medication Sig Dispense Refill Last Dose/Taking   albuterol  (VENTOLIN  HFA) 108 (90 Base) MCG/ACT inhaler INHALE 2 INHALATIONS BY MOUTH  EVERY 4 HOURS AS NEEDED FOR  WHEEZING OR FOR SHORTNESS OF  BREATH 51 g 1 Unknown   atorvastatin  (LIPITOR) 40 MG tablet TAKE 1 TABLET BY MOUTH AT  BEDTIME 90 tablet 3 Past Week   citalopram  (CELEXA ) 40 MG tablet TAKE  1 TABLET BY MOUTH DAILY 100 tablet 1 10/18/2023   clopidogrel  (PLAVIX ) 75 MG tablet TAKE 1 TABLET BY MOUTH DAILY AT  9AM 100 tablet 1 10/18/2023   fluticasone  (FLONASE ) 50 MCG/ACT nasal spray Place 2 sprays into both nostrils daily. Use for 4-6 weeks then stop and use seasonally or as needed. 16 g 0 Unknown   gabapentin  (NEURONTIN ) 600 MG tablet TAKE 1 TABLET BY MOUTH 4 TIMES  DAILY 400 tablet 2 10/18/2023   leptospermum manuka honey (MEDIHONEY) gel Apply 1 Application topically daily. 44 mL 0 Unknown   midodrine  (PROAMATINE ) 5 MG tablet Take 1 tablet (5 mg total) by mouth 3 (three) times daily with meals. 21 tablet 0 10/18/2023   MOUNJARO  10 MG/0.5ML Pen INJECT THE CONTENTS OF ONE PEN  SUBCUTANEOUSLY WEEKLY AS  DIRECTED 6 mL 3 10/18/2023   mupirocin  ointment (BACTROBAN ) 2 % Apply 1 Application topically 2 (two) times daily. For 10 days 22 g 0 Unknown   naloxone  (NARCAN ) nasal spray 4 mg/0.1 mL Place 1 spray into  the nose once.   Taking   nicotine  (NICODERM CQ  - DOSED IN MG/24 HOURS) 14 mg/24hr patch Place onto the skin.   Unknown   nystatin  (MYCOSTATIN /NYSTOP ) powder Apply 1 Application topically 3 (three) times daily. 15 g 0 10/18/2023   ondansetron  (ZOFRAN -ODT) 4 MG disintegrating tablet DISSOLVE 1 TABLET ON THE TONGUE EVERY 8 HOURS AS NEEDED FOR NAUSEA AND VOMITING 30 tablet 2 Unknown   oxyCODONE -acetaminophen  (PERCOCET) 10-325 MG tablet Take 1 tablet by mouth every 6 (six) hours as needed for pain.   Unknown   pantoprazole  (PROTONIX ) 40 MG tablet TAKE 1 TABLET BY MOUTH DAILY AT  9 AM 100 tablet 1 10/18/2023   rizatriptan  (MAXALT -MLT) 10 MG disintegrating tablet Take 1 tablet (10 mg total) by mouth as needed. May repeat in 2 hours if needed 10 tablet 2 Unknown   TRELEGY ELLIPTA  100-62.5-25 MCG/ACT AEPB USE 1 INHALATION BY MOUTH ONCE  DAILY AT THE SAME TIME EACH DAY 180 each 3 Unknown   Vitamin D , Ergocalciferol , (DRISDOL ) 1.25 MG (50000 UNIT) CAPS capsule TAKE 1 CAPSULE BY MOUTH EVERY WEDNESDAY @9AM  4  capsule 11 10/18/2023   buPROPion  (WELLBUTRIN  XL) 150 MG 24 hr tablet TAKE 1 TABLET BY MOUTH DAILY (Patient not taking: Reported on 10/19/2023) 100 tablet 1 Not Taking   methocarbamol  (ROBAXIN ) 500 MG tablet Take 500 mg by mouth 3 (three) times daily. (Patient not taking: Reported on 10/19/2023)   Not Taking   QUEtiapine  (SEROQUEL ) 100 MG tablet Take 100 mg by mouth at bedtime. (Patient not taking: Reported on 10/19/2023)   Not Taking   topiramate  (TOPAMAX ) 100 MG tablet TAKE 1 TABLET BY MOUTH TWICE  DAILY (Patient not taking: Reported on 10/19/2023) 200 tablet 2 Not Taking   Scheduled:   atorvastatin   40 mg Oral QHS   budesonide -glycopyrrolate -formoterol   2 puff Inhalation BID   citalopram   40 mg Oral Daily   clopidogrel   75 mg Oral Daily   gabapentin   600 mg Oral QID   midodrine   5 mg Oral TID WC   nicotine   14 mg Transdermal Daily   pantoprazole   40 mg Oral Daily   Infusions:   heparin  1,350 Units/hr (10/19/23 1210)    Assessment: 58 y.o. female presented to [facility] ED overnight for evaluation of abdominal pain to start heparin  drip for VTE treatment.  Yk:nazdpub, CAD, DM, HTN, HLD, PAD, and CVA. She is on Plavix .  CT Abdomen/Pelvis was concerning for possible splenic infarct vs laceration(favor infarct-per surgery)  Baseline Hgb 13.7   Plt 354   aPTT 29 INR 1.0  Goal of Therapy:  anti-Xa level 0.3-0.7 units/ml Monitor platelets by anticoagulation protocol: Yes   Plan: anti-Xa level therapeutic x 1 ---continue heparin  infusion at 1350 units/hr ---recheck anti-Xa level in 6 hours  ---Continue to monitor H&H and platelets  Adriana Bolster, PharmD, BCPS Clinical Pharmacist 10/19/2023

## 2023-10-19 NOTE — Consult Note (Signed)
 [facility] SURGICAL CONSULTATION NOTE (initial) - cpt: 00756   HISTORY OF PRESENT ILLNESS (HPI):  58 y.o. female presented to [facility] ED overnight for evaluation of abdominal pain. Patient reports the acute onset of what sounds like LUQ abdominal pain yesterday evening. This ultimately was accompanied by nausea and emesis. No fever, chills, CP, SOB. urinary changes. Bowel movements have been normal and she had one prior to arrival. Previous abdominal surgeries positive for abdominal hysterectomy and abdominal hernia repair. She has significant comorbid disease including obesity, CAD, DM, HTN, HLD, PAD, and CVA. She is on Plavix . Work up in the ED revealed a mild leukocytosis to 11.4K, Hgb to 13.7, sCr 0.55, no electrolyte derangements. CT Abdomen/Pelvis was concerning for possible splenic infarct vs laceration. She denied any recent trauma. She was admitted to the medicine service.    Surgery is consulted by emergency medicine physician Dr. Ginnie Shams, MD in this context for evaluation and management of possible splenic infarct vs laceration.  PAST MEDICAL HISTORY (PMH):  Past Medical History:  Diagnosis Date   Anxiety    Cancer (HCC)    a spot on liver and treated    Complication of anesthesia    restless,easily upset   COPD (chronic obstructive pulmonary disease) (HCC)    Depression    Diabetes mellitus without complication (HCC)    Diverticulitis    Fatty liver    GERD (gastroesophageal reflux disease)    Headache(784.0)    migraines   History of kidney stones    Hyperlipidemia    Hypertension    PAD (peripheral artery disease)    Pneumonia    Restless    Stroke (HCC)      PAST SURGICAL HISTORY (PSH):  Past Surgical History:  Procedure Laterality Date   ABDOMINAL HYSTERECTOMY     ACHILLES TENDON LENGTHENING Right 08/18/2017   Procedure: ACHILLES TENDON LENGTHENING;  Surgeon: Celena Sharper, MD;  Location: [facility] OR;  Service: Orthopedics;  Laterality: Right;    AMPUTATION TOE Left 04/02/2023   Procedure: AMPUTATION, TOE;  Surgeon: Malvin Marsa FALCON, DPM;  Location: [facility] ORS;  Service: Orthopedics/Podiatry;  Laterality: Left;  LEFT GREAT TOE   ANTERIOR CERVICAL DECOMP/DISCECTOMY FUSION N/A 03/11/2012   Procedure: ANTERIOR CERVICAL DECOMPRESSION/DISCECTOMY FUSION 1 LEVEL;  Surgeon: Reyes JONETTA Budge, MD;  Location: [facility] NEURO OR[facility];  Service: Neurosurgery;  Laterality: N/A;  Cervical five-six anterior cervical decompression with fusion interbody prothesis plating and bonegraft   BACK SURGERY     ENDARTERECTOMY FEMORAL Left 04/01/2023   Procedure: ENDARTERECTOMY, FEMORAL;  Surgeon: Jama Cordella MATSU, MD;  Location: [facility] ORS;  Service: Vascular;  Laterality: Left;  with Popiteal stent placment   EXTERNAL FIXATION LEG Right 08/10/2017   Procedure: EXTERNAL FIXATION ANKLE;  Surgeon: Mardee Lynwood SQUIBB, MD;  Location: [facility] ORS;  Service: Orthopedics;  Laterality: Right;   EXTERNAL FIXATION REMOVAL Right 08/18/2017   Procedure: REMOVAL EXTERNAL FIXATION LEG;  Surgeon: Celena Sharper, MD;  Location: [facility] OR;  Service: Orthopedics;  Laterality: Right;   EYE SURGERY     HERNIA REPAIR     IR FL GUIDED LOC OF NEEDLE/CATH TIP FOR SPINAL INJECTION LT  12/09/2021   IR FL GUIDED LOC OF NEEDLE/CATH TIP FOR SPINAL INJECTION RT  12/09/2021   JOINT REPLACEMENT     bil knees   LOWER EXTREMITY ANGIOGRAPHY Left 03/27/2023   Procedure: Lower Extremity Angiography;  Surgeon: Jama Cordella MATSU, MD;  Location: [facility] INVASIVE CV LAB;  Service: Cardiovascular;  Laterality: Left;   ORIF ANKLE  FRACTURE Right 08/18/2017   Procedure: OPEN REDUCTION INTERNAL FIXATION (ORIF) ANKLE FRACTURE;  Surgeon: Celena Sharper, MD;  Location: [facility] OR;  Service: Orthopedics;  Laterality: Right;   REPLACEMENT TOTAL KNEE BILATERAL       MEDICATIONS:  Prior to Admission medications   Medication Sig Start Date End Date Taking? Authorizing Provider  naloxone  (NARCAN ) nasal spray 4 mg/0.1 mL Place 1 spray into  the nose once. 10/05/23  Yes [provider]  QUEtiapine  (SEROQUEL ) 100 MG tablet Take 100 mg by mouth at bedtime. 09/28/23  Yes [provider]  albuterol  (VENTOLIN  HFA) 108 (90 Base) MCG/ACT inhaler INHALE 2 INHALATIONS BY MOUTH  EVERY 4 HOURS AS NEEDED FOR  WHEEZING OR FOR SHORTNESS OF  BREATH 08/28/23   Karamalegos, Marsa PARAS, DO  atorvastatin  (LIPITOR) 40 MG tablet TAKE 1 TABLET BY MOUTH AT  BEDTIME 01/08/23   Karamalegos, Alexander J, DO  buPROPion  (WELLBUTRIN  XL) 150 MG 24 hr tablet TAKE 1 TABLET BY MOUTH DAILY 06/05/23   Edman, Marsa PARAS, DO  citalopram  (CELEXA ) 40 MG tablet TAKE 1 TABLET BY MOUTH DAILY 08/28/23   Edman, Marsa PARAS, DO  clopidogrel  (PLAVIX ) 75 MG tablet TAKE 1 TABLET BY MOUTH DAILY AT  9AM 06/05/23   Karamalegos, Marsa PARAS, DO  fluticasone  (FLONASE ) 50 MCG/ACT nasal spray Place 2 sprays into both nostrils daily. Use for 4-6 weeks then stop and use seasonally or as needed. 03/02/23   Karamalegos, Marsa PARAS, DO  gabapentin  (NEURONTIN ) 600 MG tablet TAKE 1 TABLET BY MOUTH 4 TIMES  DAILY 06/10/23   Edman, Marsa PARAS, DO  leptospermum manuka honey (MEDIHONEY) gel Apply 1 Application topically daily. 04/28/23   Brown, Fallon E, NP  methocarbamol  (ROBAXIN ) 500 MG tablet Take 500 mg by mouth 3 (three) times daily. 04/07/23   [provider]  midodrine  (PROAMATINE ) 5 MG tablet Take 1 tablet (5 mg total) by mouth 3 (three) times daily with meals. 10/20/22   Darliss Rogue, MD  MOUNJARO  10 MG/0.5ML Pen INJECT THE CONTENTS OF ONE PEN  SUBCUTANEOUSLY WEEKLY AS  DIRECTED 08/11/23   Edman, Marsa PARAS, DO  mupirocin  ointment (BACTROBAN ) 2 % Apply 1 Application topically 2 (two) times daily. For 10 days 03/09/23   Edman Marsa PARAS, DO  nicotine  (NICODERM CQ  - DOSED IN MG/24 HOURS) 14 mg/24hr patch Place onto the skin. 03/16/23   [provider]  nystatin  (MYCOSTATIN /NYSTOP ) powder Apply 1 Application topically 3 (three) times  daily. 08/19/23   Moishe Chiquita HERO, NP  ondansetron  (ZOFRAN -ODT) 4 MG disintegrating tablet DISSOLVE 1 TABLET ON THE TONGUE EVERY 8 HOURS AS NEEDED FOR NAUSEA AND VOMITING 04/10/23   Edman, Marsa PARAS, DO  oxyCODONE -acetaminophen  (PERCOCET) 10-325 MG tablet Take 1 tablet by mouth every 6 (six) hours as needed for pain.    [provider]  pantoprazole  (PROTONIX ) 40 MG tablet TAKE 1 TABLET BY MOUTH DAILY AT  9 AM 06/29/23   Karamalegos, Marsa PARAS, DO  rizatriptan  (MAXALT -MLT) 10 MG disintegrating tablet Take 1 tablet (10 mg total) by mouth as needed. May repeat in 2 hours if needed 06/23/23   Edman Marsa PARAS, DO  topiramate  (TOPAMAX ) 100 MG tablet TAKE 1 TABLET BY MOUTH TWICE  DAILY 10/07/22   Edman Marsa PARAS, DO  TRELEGY ELLIPTA  100-62.5-25 MCG/ACT AEPB USE 1 INHALATION BY MOUTH ONCE  DAILY AT THE SAME TIME EACH DAY 02/17/23   Karamalegos, Marsa PARAS, DO  Vitamin D , Ergocalciferol , (DRISDOL ) 1.25 MG (50000 UNIT) CAPS capsule TAKE 1 CAPSULE BY MOUTH  EVERY WEDNESDAY @9AM  06/27/22   Edman Marsa PARAS, DO     ALLERGIES:  Allergies  Allergen Reactions   Tramadol Nausea And Vomiting and Hypertension   Augmentin [Amoxicillin-Pot Clavulanate] Rash    Tolerated Zosyn  03/26/23     SOCIAL HISTORY:  Social History   Socioeconomic History   Marital status: Married    Spouse name: Not on file   Number of children: Not on file   Years of education: Not on file   Highest education level: 10th grade  Occupational History   Not on file  Tobacco Use   Smoking status: Every Day    Current packs/day: 2.00    Average packs/day: 2.0 packs/day for 46.8 years (93.5 ttl pk-yrs)    Types: Cigarettes    Start date: 1979   Smokeless tobacco: Never  Vaping Use   Vaping status: Never Used  Substance and Sexual Activity   Alcohol  use: No   Drug use: Yes    Types: Marijuana, Oxycodone     Comment: last smoked 2 days ago  8/4   Sexual activity: Not on file  Other Topics  Concern   Not on file  Social History Narrative   Not on file   Social Drivers of Health   Financial Resource Strain: Low Risk  (05/22/2023)   Overall Financial Resource Strain (CARDIA)    Difficulty of Paying Living Expenses: Not hard at all  Food Insecurity: No Food Insecurity (06/02/2023)   Hunger Vital Sign    Worried About Running Out of Food in the Last Year: Never true    Ran Out of Food in the Last Year: Never true  Transportation Needs: No Transportation Needs (06/02/2023)   PRAPARE - Administrator, Civil Service (Medical): No    Lack of Transportation (Non-Medical): No  Physical Activity: Inactive (05/22/2023)   Exercise Vital Sign    Days of Exercise per Week: 0 days    Minutes of Exercise per Session: 0 min  Stress: No Stress Concern Present (05/22/2023)   [employer] of Occupational Health - Occupational Stress Questionnaire    Feeling of Stress : Only a little  Social Connections: Moderately Isolated (06/02/2023)   Social Connection and Isolation Panel    Frequency of Communication with Friends and Family: More than three times a week    Frequency of Social Gatherings with Friends and Family: Once a week    Attends Religious Services: Never    Database administrator or Organizations: No    Attends Banker Meetings: Never    Marital Status: Married  Catering manager Violence: Not At Risk (06/02/2023)   Humiliation, Afraid, Rape, and Kick questionnaire    Fear of Current or Ex-Partner: No    Emotionally Abused: No    Physically Abused: No    Sexually Abused: No     FAMILY HISTORY:  Family History  Problem Relation Age of Onset   Diabetes Mother    Hypertension Mother    Hypertension Father       REVIEW OF SYSTEMS:  Review of Systems  Constitutional:  Negative for chills and fever.  Respiratory:  Negative for cough.   Cardiovascular:  Negative for chest pain and palpitations.  Gastrointestinal:  Positive for abdominal pain, nausea  and vomiting. Negative for blood in stool, constipation and diarrhea.  Genitourinary:  Negative for dysuria and urgency.  All other systems reviewed and are negative.   VITAL SIGNS:  Temp:  [98.5 F (36.9 C)-98.8 F (37.1  C)] 98.8 F (37.1 C) (10/06 0816) Pulse Rate:  [100-104] 104 (10/06 0816) Resp:  [13-19] 19 (10/06 0816) BP: (128-147)/(74-89) 133/74 (10/06 0816) SpO2:  [97 %-100 %] 99 % (10/06 0816) Weight:  [90.7 kg] 90.7 kg (10/06 0327)     Height: 5' 6 (167.6 cm) Weight: 90.7 kg BMI (Calculated): 32.3   INTAKE/OUTPUT:  No intake/output data recorded.  PHYSICAL EXAM:  Physical Exam Vitals and nursing note reviewed. Exam conducted with a chaperone present.  Constitutional:      General: She is not in acute distress.    Appearance: She is obese. She is not ill-appearing.     Comments: Resting in bed: NAD  HENT:     Head: Normocephalic and atraumatic.  Eyes:     General: No scleral icterus.    Conjunctiva/sclera: Conjunctivae normal.     Comments: Wearing glasses   Cardiovascular:     Rate and Rhythm: Tachycardia present.     Pulses: Normal pulses.     Heart sounds: No murmur heard. Pulmonary:     Effort: Pulmonary effort is normal. No respiratory distress.  Abdominal:     General: There is no distension.     Palpations: Abdomen is soft.     Tenderness: There is abdominal tenderness in the left upper quadrant. There is no guarding or rebound.     Comments: Abdomen is obese, soft, she reports soreness in LUQ, non-distended, no rebound/guarding   Genitourinary:    Comments: Deferred Skin:    General: Skin is warm and dry.     Coloration: Skin is not pale.  Neurological:     General: No focal deficit present.     Mental Status: She is alert. Mental status is at baseline.  Psychiatric:        Mood and Affect: Mood normal.        Behavior: Behavior normal.      Labs:     Latest Ref Rng & Units 10/19/2023    3:18 AM 09/18/2023    1:09 PM 08/24/2023   11:07 AM   CBC  WBC 4.0 - 10.5 K/uL 11.4  10.3  8.8   Hemoglobin 12.0 - 15.0 g/dL 86.2  86.6  87.3   Hematocrit 36.0 - 46.0 % 42.0  40.9  39.5   Platelets 150 - 400 K/uL 354  391  380       Latest Ref Rng & Units 10/19/2023    3:18 AM 09/18/2023    1:09 PM 08/24/2023   11:07 AM  CMP  Glucose 70 - 99 mg/dL 843  869  858   BUN 6 - 20 mg/dL 12  10  6    Creatinine 0.44 - 1.00 mg/dL 9.44  9.35  9.47   Sodium 135 - 145 mmol/L 136  139  136   Potassium 3.5 - 5.1 mmol/L 4.1  3.5  3.2   Chloride 98 - 111 mmol/L 98  102  102   CO2 22 - 32 mmol/L 23  25  22    Calcium  8.9 - 10.3 mg/dL 9.3  9.2  8.6   Total Protein 6.5 - 8.1 g/dL 7.4  7.3    Total Bilirubin 0.0 - 1.2 mg/dL 0.5  0.3    Alkaline Phos 38 - 126 U/L 64  66    AST 15 - 41 U/L 19  18    ALT 0 - 44 U/L 11  8       Imaging studies:   CT Abdomen/Pelvis (10/19/2023) personally  reviewed with small splenic area concerning for possible infarct vs laceration, and radiologist report reviewed below:  IMPRESSION: 1. New demonstration of a 1.7 cm in length subcapsular hypodensity in the superior pole of the spleen. This would either represent a grade 2 laceration or a splenic infarct. There is no perisplenic free hemorrhage or hematoma if this is a laceration. 2. New moderate upper plate anterior wedge compression fracture of the L1 vertebral body with 50% anterior height loss, 3 mm posterosuperior retropulsion. This is probably subacute given the lack of adjacent reactive soft tissue changes, but it is new from 2 months ago. 3. New wedge-shaped cortical scarring in the dorsal midpole right kidney. 4. Aortic atherosclerosis. 5. COPD. 6. Decreased size of a pleural-based consolidation laterally in the right middle lobe, now 1.5 x 1.6 cm, previously 2.3 x 2.1 cm. 7. Small hiatal hernia. 8. Rectus diastasis and outward protrusion with intact hernia repair over the protruding portion. 9. Interval stenting in the proximal left superficial femoral  artery with overlying scarring.   Assessment/Plan:  58 y.o. female with abdominal pain, nausea, emesis found to have possible splenic infarct vs laceration (favor infarct), complicated by pertinent comorbidities including morbid obesity, tobacco abuse, PAD, HTN, HLD, DM.   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 Less likely laceration and Hgb is markedly normal. I do not think there is anything to do acutely from surgical perspective.  - Will follow up Spleen US ; Question role for CTA to definitively rule out bleed   - Monitor abdominal examination    - Pain control prn; antiemetics prn  - Reasonable for anticoagulation given low concern for bleeding - Further management per primary service   All of the above findings and recommendations were discussed with the patient, and all of patient's questions were answered to her expressed satisfaction.  Thank you for the opportunity to participate in this patient's care.   -- Arthea Platt, PA-C [facility] 10/19/2023, 8:17 AM M-F: 7am - 4pm

## 2023-10-19 NOTE — TOC CM/SW Note (Signed)
 Transition of Care [facility]) CM/SW Note    Transition of Care [facility]) - Inpatient Brief Assessment   Patient Details  Name: [name] MRN: [id number] Date of Birth: [birth year]  Transition of Care [facility]) CM/SW Contact:    Alfonso Rummer, LCSW Phone Number: 10/19/2023, 12:02 PM   Clinical Narrative: KEN DELENA Rummer completed TOC chart review. No TOC needs identified. Please contact should needs arise    Transition of Care Asessment: Insurance and Status: Insurance coverage has been reviewed Patient has primary care physician: Yes (KARAMALEGOS, ALEXANDER J) Home environment has been reviewed: single famiy home   Prior/Current Home Services: No current home services Social Drivers of Health Review: SDOH reviewed no interventions necessary Readmission risk has been reviewed: No Transition of care needs: no transition of care needs at this time

## 2023-10-20 ENCOUNTER — Telehealth (HOSPITAL_COMMUNITY): Payer: Self-pay | Admitting: Pharmacy Technician

## 2023-10-20 ENCOUNTER — Inpatient Hospital Stay

## 2023-10-20 ENCOUNTER — Other Ambulatory Visit: Payer: Self-pay

## 2023-10-20 ENCOUNTER — Other Ambulatory Visit (HOSPITAL_COMMUNITY): Payer: Self-pay

## 2023-10-20 DIAGNOSIS — R918 Other nonspecific abnormal finding of lung field: Secondary | ICD-10-CM | POA: Diagnosis not present

## 2023-10-20 DIAGNOSIS — F329 Major depressive disorder, single episode, unspecified: Secondary | ICD-10-CM | POA: Diagnosis present

## 2023-10-20 DIAGNOSIS — G9340 Encephalopathy, unspecified: Secondary | ICD-10-CM | POA: Diagnosis not present

## 2023-10-20 DIAGNOSIS — R935 Abnormal findings on diagnostic imaging of other abdominal regions, including retroperitoneum: Secondary | ICD-10-CM | POA: Diagnosis not present

## 2023-10-20 DIAGNOSIS — I2510 Atherosclerotic heart disease of native coronary artery without angina pectoris: Secondary | ICD-10-CM | POA: Diagnosis not present

## 2023-10-20 DIAGNOSIS — G43909 Migraine, unspecified, not intractable, without status migrainosus: Secondary | ICD-10-CM | POA: Diagnosis not present

## 2023-10-20 DIAGNOSIS — Z7902 Long term (current) use of antithrombotics/antiplatelets: Secondary | ICD-10-CM | POA: Diagnosis not present

## 2023-10-20 DIAGNOSIS — M797 Fibromyalgia: Secondary | ICD-10-CM | POA: Diagnosis not present

## 2023-10-20 DIAGNOSIS — E559 Vitamin D deficiency, unspecified: Secondary | ICD-10-CM | POA: Diagnosis not present

## 2023-10-20 DIAGNOSIS — R000 Tachycardia, unspecified: Secondary | ICD-10-CM | POA: Diagnosis not present

## 2023-10-20 DIAGNOSIS — K3189 Other diseases of stomach and duodenum: Secondary | ICD-10-CM | POA: Diagnosis not present

## 2023-10-20 DIAGNOSIS — F17200 Nicotine dependence, unspecified, uncomplicated: Secondary | ICD-10-CM | POA: Diagnosis not present

## 2023-10-20 DIAGNOSIS — Z79899 Other long term (current) drug therapy: Secondary | ICD-10-CM | POA: Diagnosis not present

## 2023-10-20 DIAGNOSIS — J189 Pneumonia, unspecified organism: Secondary | ICD-10-CM | POA: Diagnosis present

## 2023-10-20 DIAGNOSIS — D7389 Other diseases of spleen: Secondary | ICD-10-CM | POA: Diagnosis not present

## 2023-10-20 DIAGNOSIS — Z7982 Long term (current) use of aspirin: Secondary | ICD-10-CM | POA: Diagnosis not present

## 2023-10-20 DIAGNOSIS — Z7985 Long-term (current) use of injectable non-insulin antidiabetic drugs: Secondary | ICD-10-CM | POA: Diagnosis not present

## 2023-10-20 DIAGNOSIS — R569 Unspecified convulsions: Secondary | ICD-10-CM | POA: Diagnosis present

## 2023-10-20 DIAGNOSIS — D735 Infarction of spleen: Secondary | ICD-10-CM | POA: Diagnosis not present

## 2023-10-20 DIAGNOSIS — R519 Headache, unspecified: Secondary | ICD-10-CM | POA: Diagnosis not present

## 2023-10-20 DIAGNOSIS — G8929 Other chronic pain: Secondary | ICD-10-CM | POA: Diagnosis present

## 2023-10-20 DIAGNOSIS — F17210 Nicotine dependence, cigarettes, uncomplicated: Secondary | ICD-10-CM | POA: Diagnosis present

## 2023-10-20 DIAGNOSIS — I69398 Other sequelae of cerebral infarction: Secondary | ICD-10-CM | POA: Diagnosis not present

## 2023-10-20 DIAGNOSIS — K5730 Diverticulosis of large intestine without perforation or abscess without bleeding: Secondary | ICD-10-CM | POA: Diagnosis not present

## 2023-10-20 DIAGNOSIS — E669 Obesity, unspecified: Secondary | ICD-10-CM | POA: Diagnosis present

## 2023-10-20 DIAGNOSIS — R1012 Left upper quadrant pain: Secondary | ICD-10-CM | POA: Diagnosis not present

## 2023-10-20 DIAGNOSIS — E785 Hyperlipidemia, unspecified: Secondary | ICD-10-CM | POA: Diagnosis present

## 2023-10-20 DIAGNOSIS — K760 Fatty (change of) liver, not elsewhere classified: Secondary | ICD-10-CM | POA: Diagnosis not present

## 2023-10-20 DIAGNOSIS — I951 Orthostatic hypotension: Secondary | ICD-10-CM | POA: Diagnosis not present

## 2023-10-20 DIAGNOSIS — Z833 Family history of diabetes mellitus: Secondary | ICD-10-CM | POA: Diagnosis not present

## 2023-10-20 DIAGNOSIS — G894 Chronic pain syndrome: Secondary | ICD-10-CM | POA: Diagnosis not present

## 2023-10-20 DIAGNOSIS — E1151 Type 2 diabetes mellitus with diabetic peripheral angiopathy without gangrene: Secondary | ICD-10-CM | POA: Diagnosis present

## 2023-10-20 DIAGNOSIS — R509 Fever, unspecified: Secondary | ICD-10-CM | POA: Diagnosis not present

## 2023-10-20 DIAGNOSIS — I700 Atherosclerosis of aorta: Secondary | ICD-10-CM | POA: Diagnosis not present

## 2023-10-20 DIAGNOSIS — Z8673 Personal history of transient ischemic attack (TIA), and cerebral infarction without residual deficits: Secondary | ICD-10-CM | POA: Diagnosis not present

## 2023-10-20 DIAGNOSIS — Z8249 Family history of ischemic heart disease and other diseases of the circulatory system: Secondary | ICD-10-CM | POA: Diagnosis not present

## 2023-10-20 DIAGNOSIS — J9611 Chronic respiratory failure with hypoxia: Secondary | ICD-10-CM | POA: Diagnosis present

## 2023-10-20 DIAGNOSIS — I69351 Hemiplegia and hemiparesis following cerebral infarction affecting right dominant side: Secondary | ICD-10-CM | POA: Diagnosis not present

## 2023-10-20 DIAGNOSIS — E119 Type 2 diabetes mellitus without complications: Secondary | ICD-10-CM | POA: Diagnosis not present

## 2023-10-20 DIAGNOSIS — M542 Cervicalgia: Secondary | ICD-10-CM | POA: Diagnosis not present

## 2023-10-20 DIAGNOSIS — J440 Chronic obstructive pulmonary disease with acute lower respiratory infection: Secondary | ICD-10-CM | POA: Diagnosis present

## 2023-10-20 DIAGNOSIS — Z7901 Long term (current) use of anticoagulants: Secondary | ICD-10-CM | POA: Diagnosis not present

## 2023-10-20 DIAGNOSIS — F0153 Vascular dementia, unspecified severity, with mood disturbance: Secondary | ICD-10-CM | POA: Diagnosis present

## 2023-10-20 DIAGNOSIS — K219 Gastro-esophageal reflux disease without esophagitis: Secondary | ICD-10-CM | POA: Diagnosis present

## 2023-10-20 DIAGNOSIS — J449 Chronic obstructive pulmonary disease, unspecified: Secondary | ICD-10-CM | POA: Diagnosis not present

## 2023-10-20 DIAGNOSIS — R911 Solitary pulmonary nodule: Secondary | ICD-10-CM | POA: Diagnosis not present

## 2023-10-20 DIAGNOSIS — I10 Essential (primary) hypertension: Secondary | ICD-10-CM | POA: Diagnosis present

## 2023-10-20 DIAGNOSIS — K439 Ventral hernia without obstruction or gangrene: Secondary | ICD-10-CM | POA: Diagnosis not present

## 2023-10-20 DIAGNOSIS — Z880 Allergy status to penicillin: Secondary | ICD-10-CM | POA: Diagnosis not present

## 2023-10-20 DIAGNOSIS — Z1152 Encounter for screening for COVID-19: Secondary | ICD-10-CM | POA: Diagnosis not present

## 2023-10-20 DIAGNOSIS — I6381 Other cerebral infarction due to occlusion or stenosis of small artery: Secondary | ICD-10-CM | POA: Diagnosis not present

## 2023-10-20 DIAGNOSIS — Z66 Do not resuscitate: Secondary | ICD-10-CM | POA: Diagnosis present

## 2023-10-20 LAB — CBC
HCT: 39 % (ref 36.0–46.0)
Hemoglobin: 12.4 g/dL (ref 12.0–15.0)
MCH: 27.6 pg (ref 26.0–34.0)
MCHC: 31.8 g/dL (ref 30.0–36.0)
MCV: 86.7 fL (ref 80.0–100.0)
Platelets: 327 K/uL (ref 150–400)
RBC: 4.5 MIL/uL (ref 3.87–5.11)
RDW: 14.6 % (ref 11.5–15.5)
WBC: 8.9 K/uL (ref 4.0–10.5)
nRBC: 0 % (ref 0.0–0.2)

## 2023-10-20 LAB — BASIC METABOLIC PANEL WITH GFR
Anion gap: 8 (ref 5–15)
BUN: 15 mg/dL (ref 6–20)
CO2: 25 mmol/L (ref 22–32)
Calcium: 8.5 mg/dL — ABNORMAL LOW (ref 8.9–10.3)
Chloride: 100 mmol/L (ref 98–111)
Creatinine, Ser: 0.78 mg/dL (ref 0.44–1.00)
GFR, Estimated: >60 mL/min (ref >60)
Glucose, Bld: 103 mg/dL — ABNORMAL HIGH (ref 70–99)
Potassium: 3.6 mmol/L (ref 3.5–5.1)
Sodium: 133 mmol/L — ABNORMAL LOW (ref 135–145)

## 2023-10-20 LAB — HEPARIN LEVEL (UNFRACTIONATED): Heparin Unfractionated: 0.35 [IU]/mL (ref 0.30–0.70)

## 2023-10-20 MED ORDER — APIXABAN 5 MG PO TABS
10.0000 mg | ORAL_TABLET | Freq: Two times a day (BID) | ORAL | Status: DC
Start: 2023-10-20 — End: 2023-10-20
  Administered 2023-10-20: 10 mg via ORAL
  Filled 2023-10-20: qty 2

## 2023-10-20 MED ORDER — APIXABAN 5 MG PO TABS
5.0000 mg | ORAL_TABLET | Freq: Two times a day (BID) | ORAL | 1 refills | Status: DC
Start: 2023-10-20 — End: 2023-10-26

## 2023-10-20 MED ORDER — IOHEXOL 350 MG/ML SOLN
100.0000 mL | Freq: Once | INTRAVENOUS | Status: AC | PRN
  Administered 2023-10-20: 100 mL via INTRAVENOUS

## 2023-10-20 MED ORDER — OXYCODONE HCL 5 MG PO TABS: 5.0000 mg | ORAL_TABLET | Freq: Four times a day (QID) | ORAL | Status: DC | PRN

## 2023-10-20 MED ORDER — APIXABAN (ELIQUIS) VTE STARTER PACK (10MG AND 5MG)
ORAL_TABLET | ORAL | 0 refills | Status: DC
  Filled 2023-10-20: qty 74, 30d supply, fill #0

## 2023-10-20 MED ORDER — OXYCODONE-ACETAMINOPHEN 10-325 MG PO TABS
1.0000 | ORAL_TABLET | Freq: Four times a day (QID) | ORAL | Status: DC | PRN
Start: 2023-10-20 — End: 2023-10-20

## 2023-10-20 MED ORDER — DOCUSATE SODIUM 100 MG PO CAPS
300.0000 mg | ORAL_CAPSULE | Freq: Every day | ORAL | Status: DC
  Administered 2023-10-20: 300 mg via ORAL
  Filled 2023-10-20: qty 3

## 2023-10-20 MED ORDER — OXYCODONE-ACETAMINOPHEN 5-325 MG PO TABS: 1.0000 | ORAL_TABLET | Freq: Four times a day (QID) | ORAL | Status: DC | PRN

## 2023-10-20 MED ORDER — APIXABAN 5 MG PO TABS
5.0000 mg | ORAL_TABLET | Freq: Two times a day (BID) | ORAL | Status: DC
Start: 2023-10-27 — End: 2023-10-20

## 2023-10-20 NOTE — Telephone Encounter (Signed)
 Patient Product/process development scientist completed.    The patient is insured through [facility]. Patient has Medicare and is not eligible for a copay card, but may be able to apply for patient assistance or Medicare RX Payment Plan (Patient Must reach out to their plan, if eligible for payment plan), if available.    Ran test claim for Eliquis 5 mg and the current 30 day co-pay is $0.00.  Ran test claim for Xarelto  20 mg and the current 30 day co-pay is $0.00.  This test claim was processed through [facility]- copay amounts may vary at other pharmacies due to pharmacy/plan contracts, or as the patient moves through the different stages of their insurance plan.     Reyes Sharps, CPHT Pharmacy Technician Patient Advocate Specialist Lead [facility] Patient Advocate Team Direct Number: [phone]  Fax: [phone]

## 2023-10-20 NOTE — Progress Notes (Signed)
 PHARMACY - ANTICOAGULATION CONSULT NOTE  Pharmacy Consult for Heparin  drip Indication: VTE treatment/ ?splenic infarct  Allergies  Allergen Reactions   Tramadol Nausea And Vomiting and Hypertension   Augmentin [Amoxicillin-Pot Clavulanate] Rash    Tolerated Zosyn  03/26/23    Patient Measurements: Height: 5' 6 (167.6 cm) Weight: 90.7 kg (200 lb) IBW/kg (Calculated) : 59.3 HEPARIN  DW (KG): 79.1  Vital Signs: Temp: 98.4 F (36.9 C) (10/07 0301) BP: 107/70 (10/07 0301) Pulse Rate: 87 (10/07 0301)  Labs: Recent Labs    10/19/23 0318 10/19/23 0501 10/19/23 1021 10/19/23 1855 10/20/23 0341  HGB 13.7  --   --   --  12.4  HCT 42.0  --   --   --  39.0  PLT 354  --   --   --  327  APTT  --   --  29  --   --   LABPROT  --   --  14.1  --   --   INR  --   --  1.0  --   --   HEPARINUNFRC  --   --   --  0.30 0.35  CREATININE 0.55  --   --   --  0.78  TROPONINIHS 4 4  --   --   --     Estimated Creatinine Clearance: 84.9 mL/min (by C-G formula based on SCr of 0.78 mg/dL).   Medical History: Past Medical History:  Diagnosis Date   Anxiety    Cancer (HCC)    a spot on liver and treated    Complication of anesthesia    restless,easily upset   COPD (chronic obstructive pulmonary disease) (HCC)    Depression    Diabetes mellitus without complication (HCC)    Diverticulitis    Fatty liver    GERD (gastroesophageal reflux disease)    Headache(784.0)    migraines   History of kidney stones    Hyperlipidemia    Hypertension    PAD (peripheral artery disease)    Pneumonia    Restless    Stroke (HCC)     Medications:  Medications Prior to Admission  Medication Sig Dispense Refill Last Dose/Taking   albuterol  (VENTOLIN  HFA) 108 (90 Base) MCG/ACT inhaler INHALE 2 INHALATIONS BY MOUTH  EVERY 4 HOURS AS NEEDED FOR  WHEEZING OR FOR SHORTNESS OF  BREATH 51 g 1 Unknown   atorvastatin  (LIPITOR) 40 MG tablet TAKE 1 TABLET BY MOUTH AT  BEDTIME 90 tablet 3 Past Week   citalopram   (CELEXA ) 40 MG tablet TAKE 1 TABLET BY MOUTH DAILY 100 tablet 1 10/18/2023   clopidogrel  (PLAVIX ) 75 MG tablet TAKE 1 TABLET BY MOUTH DAILY AT  9AM 100 tablet 1 10/18/2023   fluticasone  (FLONASE ) 50 MCG/ACT nasal spray Place 2 sprays into both nostrils daily. Use for 4-6 weeks then stop and use seasonally or as needed. 16 g 0 Unknown   gabapentin  (NEURONTIN ) 600 MG tablet TAKE 1 TABLET BY MOUTH 4 TIMES  DAILY 400 tablet 2 10/18/2023   leptospermum manuka honey (MEDIHONEY) gel Apply 1 Application topically daily. 44 mL 0 Unknown   midodrine  (PROAMATINE ) 5 MG tablet Take 1 tablet (5 mg total) by mouth 3 (three) times daily with meals. 21 tablet 0 10/18/2023   MOUNJARO  10 MG/0.5ML Pen INJECT THE CONTENTS OF ONE PEN  SUBCUTANEOUSLY WEEKLY AS  DIRECTED 6 mL 3 10/18/2023   mupirocin  ointment (BACTROBAN ) 2 % Apply 1 Application topically 2 (two) times daily. For 10 days 22 g 0  Unknown   naloxone  (NARCAN ) nasal spray 4 mg/0.1 mL Place 1 spray into the nose once.   Taking   nicotine  (NICODERM CQ  - DOSED IN MG/24 HOURS) 14 mg/24hr patch Place onto the skin.   Unknown   nystatin  (MYCOSTATIN /NYSTOP ) powder Apply 1 Application topically 3 (three) times daily. 15 g 0 10/18/2023   ondansetron  (ZOFRAN -ODT) 4 MG disintegrating tablet DISSOLVE 1 TABLET ON THE TONGUE EVERY 8 HOURS AS NEEDED FOR NAUSEA AND VOMITING 30 tablet 2 Unknown   oxyCODONE -acetaminophen  (PERCOCET) 10-325 MG tablet Take 1 tablet by mouth every 6 (six) hours as needed for pain.   Unknown   pantoprazole  (PROTONIX ) 40 MG tablet TAKE 1 TABLET BY MOUTH DAILY AT  9 AM 100 tablet 1 10/18/2023   rizatriptan  (MAXALT -MLT) 10 MG disintegrating tablet Take 1 tablet (10 mg total) by mouth as needed. May repeat in 2 hours if needed 10 tablet 2 Unknown   TRELEGY ELLIPTA  100-62.5-25 MCG/ACT AEPB USE 1 INHALATION BY MOUTH ONCE  DAILY AT THE SAME TIME EACH DAY 180 each 3 Unknown   Vitamin D , Ergocalciferol , (DRISDOL ) 1.25 MG (50000 UNIT) CAPS capsule TAKE 1 CAPSULE BY  MOUTH EVERY WEDNESDAY @9AM  4 capsule 11 10/18/2023   buPROPion  (WELLBUTRIN  XL) 150 MG 24 hr tablet TAKE 1 TABLET BY MOUTH DAILY (Patient not taking: Reported on 10/19/2023) 100 tablet 1 Not Taking   methocarbamol  (ROBAXIN ) 500 MG tablet Take 500 mg by mouth 3 (three) times daily. (Patient not taking: Reported on 10/19/2023)   Not Taking   QUEtiapine  (SEROQUEL ) 100 MG tablet Take 100 mg by mouth at bedtime. (Patient not taking: Reported on 10/19/2023)   Not Taking   topiramate  (TOPAMAX ) 100 MG tablet TAKE 1 TABLET BY MOUTH TWICE  DAILY (Patient not taking: Reported on 10/19/2023) 200 tablet 2 Not Taking   Scheduled:   atorvastatin   40 mg Oral QHS   budesonide -glycopyrrolate -formoterol   2 puff Inhalation BID   citalopram   40 mg Oral Daily   clopidogrel   75 mg Oral Daily   gabapentin   600 mg Oral QID   midodrine   5 mg Oral TID WC   nicotine   14 mg Transdermal Daily   pantoprazole   40 mg Oral Daily   Infusions:   heparin  1,350 Units/hr (10/19/23 2238)    Assessment: 58 y.o. female presented to [facility] ED overnight for evaluation of abdominal pain to start heparin  drip for VTE treatment.  Yk:nazdpub, CAD, DM, HTN, HLD, PAD, and CVA. She is on Plavix .  CT Abdomen/Pelvis was concerning for possible splenic infarct vs laceration(favor infarct-per surgery)  Baseline Hgb 13.7   Plt 354   aPTT 29 INR 1.0  Goal of Therapy:  anti-Xa level 0.3-0.7 units/ml Monitor platelets by anticoagulation protocol: Yes   Plan:  10/7:  HL @ 0341 = 0.35, therapeutic X 2  ---continue heparin  infusion at 1350 units/hr ---recheck HL on 10/8 with AM labs  ---Continue to monitor H&H and platelets  Lauralynn Loeb D Clinical Pharmacist 10/20/2023

## 2023-10-20 NOTE — Care Management CC44 (Signed)
 Condition Code 44 Documentation Completed  Patient Details  Name: [name] MRN: [id number] Date of Birth: [birth year]   Condition Code 44 given:   yes Patient signature on Condition Code 44 notice:   yes Documentation of 2 MD's agreement:   yes Code 44 added to claim:   yes    Alfonso Rummer, LCSW 10/20/2023, 11:15 AM

## 2023-10-20 NOTE — Care Management Important Message (Signed)
 Important Message  Patient Details  Name: AMELIYAH SARNO MRN: [id number] Date of Birth: [birth year]   Important Message Given:    Code 44 completed. Moon notice given to patient     Alfonso Rummer, LCSW 10/20/2023, 11:15 AM

## 2023-10-20 NOTE — Progress Notes (Signed)
 [facility] SURGICAL PROGRESS NOTE (cpt [phone])  Hospital Day(s): 1.   Interval History: Patient seen and examined, no acute events or new complaints overnight. Patient reports her abdominal pain waxes and wanes. She is up eating breakfast this AM. No fever, chills, emesis. Previous, mild, leukocytosis now resolved; WBC 11.4K. Hgb remains normal at 12.4. Renal function normal with sCr - 0.78; UO - unmeasured. Mild hyponatremia to 133 otherwise no electrolyte derangements. CTA completed this AM.   Review of Systems:  Constitutional: denies fever, chills  HEENT: denies cough or congestion  Respiratory: denies any shortness of breath  Cardiovascular: denies chest pain or palpitations  Gastrointestinal: + abdominal pain, denied N/V Genitourinary: denies burning with urination or urinary frequency Musculoskeletal: denies pain, decreased motor or sensation  Vital signs in last 24 hours: [min-max] current  Temp:  [98.4 F (36.9 C)-99 F (37.2 C)] 99 F (37.2 C) (10/07 0745) Pulse Rate:  [87-93] 90 (10/07 0745) Resp:  [18] 18 (10/07 0745) BP: (105-116)/(66-72) 105/66 (10/07 0745) SpO2:  [95 %-98 %] 96 % (10/07 0745)     Height: 5' 6 (167.6 cm) Weight: 90.7 kg BMI (Calculated): 32.3   Intake/Output last 2 shifts:  10/06 0701 - 10/07 0700 In: 1978.7 [P.O.:640; I.V.:338.7; IV Piggyback:1000] Out: -    Physical Exam:  Constitutional: alert, cooperative and no distress  HENT: normocephalic without obvious abnormality  Eyes: PERRL, EOM's grossly intact and symmetric  Respiratory: breathing non-labored at rest  Cardiovascular: regular rate and sinus rhythm  Gastrointestinal: soft, LUQ soreness, non-distended, no rebound/guarding. She is certainly not peritonitic   Labs:     Latest Ref Rng & Units 10/20/2023    3:41 AM 10/19/2023    3:18 AM 09/18/2023    1:09 PM  CBC  WBC 4.0 - 10.5 K/uL 8.9  11.4  10.3   Hemoglobin 12.0 - 15.0 g/dL 87.5  86.2  86.6   Hematocrit 36.0 -  46.0 % 39.0  42.0  40.9   Platelets 150 - 400 K/uL 327  354  391       Latest Ref Rng & Units 10/20/2023    3:41 AM 10/19/2023    3:18 AM 09/18/2023    1:09 PM  CMP  Glucose 70 - 99 mg/dL 896  843  869   BUN 6 - 20 mg/dL 15  12  10    Creatinine 0.44 - 1.00 mg/dL 9.21  9.44  9.35   Sodium 135 - 145 mmol/L 133  136  139   Potassium 3.5 - 5.1 mmol/L 3.6  4.1  3.5   Chloride 98 - 111 mmol/L 100  98  102   CO2 22 - 32 mmol/L 25  23  25    Calcium  8.9 - 10.3 mg/dL 8.5  9.3  9.2   Total Protein 6.5 - 8.1 g/dL  7.4  7.3   Total Bilirubin 0.0 - 1.2 mg/dL  0.5  0.3   Alkaline Phos 38 - 126 U/L  64  66   AST 15 - 41 U/L  19  18   ALT 0 - 44 U/L  11  8      Imaging studies:   CTA Abdomen/Pelvis (10/20/2023) personally reviewed without evidence of splenic hemorrhage, and radiologist report pending...   Assessment/Plan:  58 y.o. female with abdominal pain, nausea, emesis found to have possible splenic infarct vs laceration (favor infarct), complicated by pertinent comorbidities including morbid obesity, tobacco abuse, PAD, HTN, HLD, DM.   - CTA without gross evidence  of splenic laceration/bleed and Hgb remains markedly normal. 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 We will remain available if needed.   All of the above findings and recommendations were discussed with the patient, and the medical team, and all of patient's questions were answered to her expressed satisfaction.  -- Deanna Platt, PA-C [facility] 10/20/2023, 8:48 AM M-F: 7am - 4pm

## 2023-10-20 NOTE — Evaluation (Signed)
 Occupational Therapy Evaluation Patient Details Name: [name] MRN: [id number] DOB: 1965/03/18 Today's Date: 10/20/2023   History of Present Illness   Pt is a 58 y/o F admitted on 10/19/23 after presenting with c/o acute LUQ abdominal pain x 2 days, associated with N&V. Pt being treated for splenic infarct. PMH: recurrent strokes with chronic residual R sided weakness, HTN, HLD, PVD, DMII, anxiety, CA, COPD, depression, diverticulitis, PAD     Clinical Impressions Pt was seen for OT evaluation this date. Prior to hospital admission, pt was living with her spouse who assisted with washing her back and all IADL. She completed ADL primarily from bed level and would get to her w/c 3x/wk via hoyer lift. Pt presents with deficits in strength, activity tolerance, abdominal pain, and balance, affecting safe and optimal ADL completion. Pt currently requires MIN A for bed mobility and bed level bathing, dressing, and toileting. Pt would benefit from skilled OT services to address noted impairments and functional limitations (see below for any additional details) in order to maximize safety and independence while minimizing future risk of falls, injury, and readmission. Anticipate the need for follow up OT services upon acute hospital DC.    If plan is discharge home, recommend the following:   A little help with walking and/or transfers;A little help with bathing/dressing/bathroom;Assistance with cooking/housework;Assist for transportation;Help with stairs or ramp for entrance     Functional Status Assessment   Patient has had a recent decline in their functional status and demonstrates the ability to make significant improvements in function in a reasonable and predictable amount of time.     Equipment Recommendations   None recommended by OT     Recommendations for Other Services         Precautions/Restrictions   Precautions Precautions: Fall Precaution/Restrictions Comments:  chronic R sided weakness Restrictions Weight Bearing Restrictions Per Provider Order: No     Mobility Bed Mobility Overal bed mobility: Needs Assistance             General bed mobility comments: grossly MIN A for bed mobility with bed repositioning to improve pt's ability to boost herself up, roll    Transfers                          Balance                                           ADL either performed or assessed with clinical judgement   ADL Overall ADL's : Needs assistance/impaired                                       General ADL Comments: Pt currently requires MIN A for bed level toileting, dressing, and bathing 2/2 decreased strength, activity tolerance, and abdominal pain     Vision         Perception         Praxis         Pertinent Vitals/Pain Pain Assessment Pain Assessment: 0-10 Pain Score: 8  Pain Location: L abdominal pain radiating to her back Pain Descriptors / Indicators: Aching Pain Intervention(s): Limited activity within patient's tolerance, Monitored during session, Premedicated before session, Repositioned, Patient requesting pain meds-RN notified     Extremity/Trunk Assessment Upper  Extremity Assessment Upper Extremity Assessment: Right hand dominant;RUE deficits/detail RUE Deficits / Details: chronic R sided weakness 2/2 hx of CVA   Lower Extremity Assessment Lower Extremity Assessment: RLE deficits/detail RLE Deficits / Details: chronic R sided weakness 2/2 hx of CVA; 2/5 knee extension in sitting, R ankle appears to invert, reports hx of breaking R ankle       Communication Communication Communication: No apparent difficulties   Cognition Arousal: Alert Behavior During Therapy: WFL for tasks assessed/performed Cognition: No apparent impairments                               Following commands: Impaired Following commands impaired: Follows multi-step commands  with increased time     Cueing  General Comments   Cueing Techniques: Verbal cues      Exercises     Shoulder Instructions      Home Living Family/patient expects to be discharged to:: Private residence Living Arrangements: Spouse/significant other Available Help at Discharge: Family;Available 24 hours/day Type of Home: Mobile home Home Access: Ramped entrance     Home Layout: One level               Home Equipment: Wheelchair - power;Wheelchair - manual;Hospital bed;Other (comment)   Additional Comments: hoyer lift      Prior Functioning/Environment Prior Level of Function : Needs assist             Mobility Comments: Pt reports she uses hoyer lift to transfer to power w/c 3x/week, otherwise stays in bed. Was receiving HHPT prior to admission (reports they finished up ~1 week prior). ADLs Comments: Returns to bed for peri hygiene, does bathe herself (except for her back). Mod indep dressing from bed level. Indep med mgt. Spouse does most IADL    OT Problem List: Decreased activity tolerance;Pain;Decreased strength;Impaired balance (sitting and/or standing);Decreased knowledge of use of DME or AE;Obesity;Impaired UE functional use   OT Treatment/Interventions: Self-care/ADL training;Therapeutic exercise;Therapeutic activities;DME and/or AE instruction;Energy conservation;Patient/family education;Balance training      OT Goals(Current goals can be found in the care plan section)   Acute Rehab OT Goals Patient Stated Goal: go home and get stronger OT Goal Formulation: With patient Time For Goal Achievement: 11/03/23 Potential to Achieve Goals: Good ADL Goals Pt Will Perform Upper Body Dressing: with modified independence;bed level Pt Will Perform Lower Body Dressing: with modified independence;bed level Pt Will Perform Toileting - Clothing Manipulation and hygiene: with modified independence;bed level Additional ADL Goal #1: Pt will verbalize plan to  implement at least 1 learned ECS to support improved indep with ADL/IADL participation.   OT Frequency:  Min 1X/week    Co-evaluation              AM-PAC OT 6 Clicks Daily Activity     Outcome Measure Help from another person eating meals?: None Help from another person taking care of personal grooming?: A Little Help from another person toileting, which includes using toliet, bedpan, or urinal?: A Little Help from another person bathing (including washing, rinsing, drying)?: A Little Help from another person to put on and taking off regular upper body clothing?: A Little Help from another person to put on and taking off regular lower body clothing?: A Little 6 Click Score: 19   End of Session    Activity Tolerance: Patient limited by pain Patient left: in bed;with call bell/phone within reach;with bed alarm set  OT Visit Diagnosis: Other abnormalities  of gait and mobility (R26.89);Pain Pain - Right/Left: Left Pain - part of body:  (abdomen)                Time: 9142-9090 OT Time Calculation (min): 12 min Charges:  OT General Charges $OT Visit: 1 Visit OT Evaluation $OT Eval Low Complexity: 1 Low  Warren SAUNDERS., MPH, MS, OTR/L ascom [phone] 10/20/23, 11:10 AM

## 2023-10-20 NOTE — Care Management Obs Status (Signed)
 MEDICARE OBSERVATION STATUS NOTIFICATION   Patient Details  Name: [name] MRN: [id number] Date of Birth: 1965-09-27   Medicare Observation Status Notification Given:  No (code 9 completed today)    Rojelio [name] 10/20/2023, 2:44 PM

## 2023-10-20 NOTE — TOC Transition Note (Signed)
 Transition of Care [facility]) - Discharge Note   Patient Details  Name: [name] MRN: [id number] Date of Birth: [birth year]  Transition of Care [facility]) CM/SW Contact:  Alfonso Rummer, LCSW Phone Number: 10/20/2023, 11:18 AM   Clinical Narrative:     LCSW A. Eboney Claybrook met pt at bedside. Pt will discharge home with [facility] providing pt and ot. LCSW A Tanyla Stege contacted [facility] to confirm services. Code 44 given to patient and spouse call and left message regarding dx. TOC signing off .   Final next level of care: Home/Self Care Barriers to Discharge: Barriers Resolved   Patient Goals and CMS Choice     Choice offered to / list presented to : Patient      Discharge Placement                  Name of family member notified: Christopher Darner message left Patient and family notified of of transfer: 10/20/23  Discharge Plan and Services Additional resources added to the After Visit Summary for                            [facility] Arranged: PT, OT HH Agency: Other - See comment [place]) Date HH Agency Contacted: 10/20/23   Representative spoke with at [facility] Agency: [facility]  Social Drivers of Health (SDOH) Interventions SDOH Screenings   Food Insecurity: No Food Insecurity (10/19/2023)  Housing: Low Risk  (10/19/2023)  Transportation Needs: No Transportation Needs (10/19/2023)  Utilities: Not At Risk (10/19/2023)  Alcohol  Screen: Low Risk  (05/22/2023)  Depression (PHQ2-9): High Risk (06/23/2023)  Financial Resource Strain: Low Risk  (05/22/2023)  Physical Activity: Inactive (05/22/2023)  Social Connections: Moderately Isolated (06/02/2023)  Stress: No Stress Concern Present (05/22/2023)  Tobacco Use: High Risk (10/19/2023)  Health Literacy: Adequate Health Literacy (05/22/2023)     Readmission Risk Interventions     No data to display

## 2023-10-20 NOTE — Hospital Course (Addendum)
 Partly taken from prior notes.  [name] is a 58 y.o. female with medical history significant of recurrent strokes with chronic residual right-sided weakness, HTN, HLD, PVD, IIDM, presented with acute left upper quadrant abdominal pain.   On presentation vitals stable, labs with mild leukocytosis at 11.4, hemoglobin 13.3.CT abdomen pelvis with contrast showed 1.7 cm (subcapsular hypodensity in superior pole of the spleen laceration versus splenic infarct. No free hemorrhage or hematoma associated.   For concern of splenic infarct she was started on heparin  infusion and general surgery was also consulted and they recommend repeating CTA of abdomen and pelvis in the morning.  10/7: Hemodynamically stable, leukocytosis resolved, mild hyponatremia with sodium at 133.  CTA abdomen and pelvis with a concern of splenic infarct and no other significant abnormality.  Heparin  is being switched with Eliquis, likely will need for at least 3 to 28-month.  Home Plavix  was discontinued and she can restart once completed with Eliquis.  We will let her primary care provider decided the duration between 3 to 6 months.  She will continue with the rest of her home medications and need to have a close follow-up with her providers for further assistance.

## 2023-10-20 NOTE — Discharge Instructions (Signed)
 Information on my medicine - ELIQUIS (apixaban)  This medication education was reviewed with me or my healthcare representative as part of my discharge preparation.  The pharmacist that spoke with me during my hospital stay was:  Ondre Salvetti A, RPH  Why was Eliquis prescribed for you? Eliquis was prescribed to treat blood clots that may have been found in the spleen and to reduce the risk of them occurring again.  What do You need to know about Eliquis ? The starting dose is 10 mg (two 5 mg tablets) taken TWICE daily for the FIRST SEVEN (7) DAYS, then on (enter date)  ***  the dose is reduced to ONE 5 mg tablet taken TWICE daily.  Eliquis may be taken with or without food.   Try to take the dose about the same time in the morning and in the evening. If you have difficulty swallowing the tablet whole please discuss with your pharmacist how to take the medication safely.  Take Eliquis exactly as prescribed and DO NOT stop taking Eliquis without talking to the doctor who prescribed the medication.  Stopping may increase your risk of developing a new blood clot.  Refill your prescription before you run out.  After discharge, you should have regular check-up appointments with your healthcare provider that is prescribing your Eliquis.    What do you do if you miss a dose? If a dose of ELIQUIS is not taken at the scheduled time, take it as soon as possible on the same day and twice-daily administration should be resumed. The dose should not be doubled to make up for a missed dose.  Important Safety Information A possible side effect of Eliquis is bleeding. You should call your healthcare provider right away if you experience any of the following: Bleeding from an injury or your nose that does not stop. Unusual colored urine (red or dark brown) or unusual colored stools (red or black). Unusual bruising for unknown reasons. A serious fall or if you hit your head (even if there is no  bleeding).  Some medicines may interact with Eliquis and might increase your risk of bleeding or clotting while on Eliquis. To help avoid this, consult your healthcare provider or pharmacist prior to using any new prescription or non-prescription medications, including herbals, vitamins, non-steroidal anti-inflammatory drugs (NSAIDs) and supplements.  This website has more information on Eliquis (apixaban): [email or web address].[email or web address]

## 2023-10-20 NOTE — Discharge Summary (Signed)
 Physician Discharge Summary   Patient: [name] MRN: [id number] DOB: 12-19-1965  Admit date:     10/19/2023  Discharge date: 10/20/23  Discharge Physician: Amaryllis Dare   PCP: Edman Marsa PARAS, DO   Recommendations at discharge:  Please obtain CBC and BMP on follow-up Patient is being discharged on Eliquis for splenic infarct, she will likely need 3 to 73-month.  Patient can resume home Plavix  after completing Eliquis. Follow-up with primary care provider within a week  Discharge Diagnoses: Principal Problem:   Abdominal pain Active Problems:   Infarction of spleen   Hospital Course: Partly taken from prior notes.  [name] is a 58 y.o. female with medical history significant of recurrent strokes with chronic residual right-sided weakness, HTN, HLD, PVD, IIDM, presented with acute left upper quadrant abdominal pain.   On presentation vitals stable, labs with mild leukocytosis at 11.4, hemoglobin 13.3.CT abdomen pelvis with contrast showed 1.7 cm (subcapsular hypodensity in superior pole of the spleen laceration versus splenic infarct. No free hemorrhage or hematoma associated.   For concern of splenic infarct she was started on heparin  infusion and general surgery was also consulted and they recommend repeating CTA of abdomen and pelvis in the morning.  10/7: Hemodynamically stable, leukocytosis resolved, mild hyponatremia with sodium at 133.  CTA abdomen and pelvis with a concern of splenic infarct and no other significant abnormality.  Heparin  is being switched with Eliquis, likely will need for at least 3 to 55-month.  Home Plavix  was discontinued and she can restart once completed with Eliquis.  We will let her primary care provider decided the duration between 3 to 6 months.  She will continue with the rest of her home medications and need to have a close follow-up with her providers for further assistance.   Pain control - [place]  Controlled Substance  Reporting System database was reviewed. and patient was instructed, not to drive, operate heavy machinery, perform activities at heights, swimming or participation in water  activities or provide baby-sitting services while on Pain, Sleep and Anxiety Medications; until their outpatient Physician has advised to do so again. Also recommended to not to take more than prescribed Pain, Sleep and Anxiety Medications.  Consultants: General surgery Procedures performed: None Disposition: Home Diet recommendation:  Discharge Diet Orders (From admission, onward)     Start     Ordered   10/20/23 0000  Diet - low sodium heart healthy        10/20/23 1049           Cardiac and Carb modified diet DISCHARGE MEDICATION: Allergies as of 10/20/2023       Reactions   Tramadol Nausea And Vomiting, Hypertension   Augmentin [amoxicillin-pot Clavulanate] Rash   Tolerated Zosyn  03/26/23        Medication List     STOP taking these medications    buPROPion  150 MG 24 hr tablet Commonly known as: WELLBUTRIN  XL   clopidogrel  75 MG tablet Commonly known as: PLAVIX        TAKE these medications    albuterol  108 (90 Base) MCG/ACT inhaler Commonly known as: VENTOLIN  HFA INHALE 2 INHALATIONS BY MOUTH  EVERY 4 HOURS AS NEEDED FOR  WHEEZING OR FOR SHORTNESS OF  BREATH   Apixaban Starter Pack (10mg  and 5mg ) Commonly known as: ELIQUIS STARTER PACK Take 2 tablets (10 mg) by mouth 2 (two) times daily for 7 days, THEN 1 tablet (5 mg) 2 (two) times daily for 23 days. Start taking on: October 20, 2023   apixaban 5 MG Tabs tablet Commonly known as: ELIQUIS Take 1 tablet (5 mg total) by mouth 2 (two) times daily.   atorvastatin  40 MG tablet Commonly known as: LIPITOR TAKE 1 TABLET BY MOUTH AT  BEDTIME   citalopram  40 MG tablet Commonly known as: CELEXA  TAKE 1 TABLET BY MOUTH DAILY   fluticasone  50 MCG/ACT nasal spray Commonly known as: FLONASE  Place 2 sprays into both nostrils daily. Use for 4-6  weeks then stop and use seasonally or as needed.   gabapentin  600 MG tablet Commonly known as: NEURONTIN  TAKE 1 TABLET BY MOUTH 4 TIMES  DAILY   leptospermum manuka honey gel Apply 1 Application topically daily.   methocarbamol  500 MG tablet Commonly known as: ROBAXIN  Take 500 mg by mouth 3 (three) times daily.   midodrine  5 MG tablet Commonly known as: PROAMATINE  Take 1 tablet (5 mg total) by mouth 3 (three) times daily with meals.   Mounjaro  10 MG/0.5ML Pen Generic drug: tirzepatide  INJECT THE CONTENTS OF ONE PEN  SUBCUTANEOUSLY WEEKLY AS  DIRECTED   mupirocin  ointment 2 % Commonly known as: BACTROBAN  Apply 1 Application topically 2 (two) times daily. For 10 days   naloxone  4 MG/0.1ML Liqd nasal spray kit Commonly known as: NARCAN  Place 1 spray into the nose once.   nicotine  14 mg/24hr patch Commonly known as: NICODERM CQ  - dosed in mg/24 hours Place onto the skin.   nystatin  powder Commonly known as: MYCOSTATIN /NYSTOP  Apply 1 Application topically 3 (three) times daily.   ondansetron  4 MG disintegrating tablet Commonly known as: ZOFRAN -ODT DISSOLVE 1 TABLET ON THE TONGUE EVERY 8 HOURS AS NEEDED FOR NAUSEA AND VOMITING   oxyCODONE -acetaminophen  10-325 MG tablet Commonly known as: PERCOCET Take 1 tablet by mouth every 6 (six) hours as needed for pain.   pantoprazole  40 MG tablet Commonly known as: PROTONIX  TAKE 1 TABLET BY MOUTH DAILY AT  9 AM   QUEtiapine  100 MG tablet Commonly known as: SEROQUEL  Take 100 mg by mouth at bedtime.   rizatriptan  10 MG disintegrating tablet Commonly known as: MAXALT -MLT Take 1 tablet (10 mg total) by mouth as needed. May repeat in 2 hours if needed   topiramate  100 MG tablet Commonly known as: TOPAMAX  TAKE 1 TABLET BY MOUTH TWICE  DAILY   Trelegy Ellipta  100-62.5-25 MCG/ACT Aepb Generic drug: Fluticasone -Umeclidin-Vilant USE 1 INHALATION BY MOUTH ONCE  DAILY AT THE SAME TIME EACH DAY   Vitamin D  (Ergocalciferol ) 1.25 MG  (50000 UNIT) Caps capsule Commonly known as: DRISDOL  TAKE 1 CAPSULE BY MOUTH EVERY WEDNESDAY @9AM         Follow-up Information     Edman Marsa PARAS, DO. Go on 10/26/2023.   Specialty: Family Medicine Why: Your appointment is at 3:00pm. Please arrive 15 mins. early to check in. Contact information: [address] [place] [address] [phone]                Discharge Exam: [name]   10/19/23 0327  Weight: 90.7 kg   General.  Overweight lady, in no acute distress. Pulmonary.  Lungs clear bilaterally, normal respiratory effort. CV.  Regular rate and rhythm, no JVD, rub or murmur. Abdomen.  Soft, nontender, nondistended, BS positive. CNS.  Alert and oriented .  No focal neurologic deficit. Extremities.  No edema, no cyanosis, pulses intact and symmetrical. Psychiatry.  Judgment and insight appears normal.   Condition at discharge: stable  The results of significant diagnostics from this hospitalization (including imaging, microbiology, ancillary  and laboratory) are listed below for reference.   Imaging Studies: CT Angio Abd/Pel w/ and/or w/o Result Date: 10/20/2023 EXAM: CTA ABDOMEN AND PELVIS WITHOUT AND WITH CONTRAST 10/20/2023 06:54:52 AM TECHNIQUE: CTA images of the abdomen and pelvis without and with intravenous contrast (100 mL iohexol  (OMNIPAQUE ) 350 MG/ML injection). Three-dimensional MIP/volume rendered formations were performed. Automated exposure control, iterative reconstruction, and/or weight based adjustment of the mA/kV was utilized to reduce the radiation dose to as low as reasonably achievable. COMPARISON: CT abdomen and pelvis from 10/19/2023. CLINICAL HISTORY: Rule out splenic bleed, concern for splenic infarct. FINDINGS: VASCULATURE: AORTA: Aortic atherosclerotic calcification. No acute finding. No abdominal aortic aneurysm. No dissection. CELIAC TRUNK: The celiac artery and its branches are patent including the splenic artery without signs of  thrombosis or contrast extravasation. SUPERIOR MESENTERIC ARTERY: The superior mesenteric artery is patent without significant stenosis or signs of dissection. RENAL ARTERIES: No acute finding. No occlusion or significant stenosis. ILIAC ARTERIES: No acute finding. No occlusion or significant stenosis. IMA: IMA is patent. LIVER: The liver is unremarkable. GALLBLADDER AND BILE DUCTS: Hyperdense material in the portion of the gallbladder likely represents biliary excretion of contrast. No biliary ductal dilatation. SPLEEN: Small, linear wedge-shaped area of hypodensity within the superior aspect of the spleen is stable from previous exam and is compatible with a small infarct measuring approximately 1.6 cm, image 09/20/2023. Unchanged cyst within the inferior spleen measuring 1.3 cm. No signs of perisplenic fluid/hemorrhage. PANCREAS: The pancreas is unremarkable. ADRENAL GLANDS: Bilateral adrenal glands demonstrate no acute abnormality. KIDNEYS, URETERS AND BLADDER: Right renal cortical scarring is unchanged. No stones in the kidneys or ureters. No hydronephrosis. No perinephric or periureteral stranding. Urinary bladder is unremarkable. GI AND BOWEL: Stomach and duodenal sweep demonstrate no acute abnormality. No abnormal bowel dilatation or inflammation. The appendix is not visualized. There is no bowel obstruction. No abnormal bowel wall thickening or distension. REPRODUCTIVE: Status post hysterectomy. No adnexal mass. PERITONEUM AND RETROPERITONEUM: No free fluid or fluid collections identified within the abdomen or pelvis. No ascites or free air. LUNG BASE: Subpleural area of nodular consolidative change within the lateral right middle lobe measures 1 cm and is only partially visualized. On the previous exam there was a 1.6 cm area of nodular consolidation which is decreased from 2.3 cm on 08/22/2023. LYMPH NODES: No lymphadenopathy. BONES AND SOFT TISSUES: Status post ventral abdominal wall hernia repair with  persistent laxity of the midline lower abdominal wall. Signs of previous vascular access within the left inguinal region. No acute abnormality of the bones. No acute soft tissue abnormality. IMPRESSION: 1. 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 No perisplenic fluid or hemorrhage. 2. Celiac, superior mesenteric, renal, and inferior mesenteric arteries are patent without significant stenosis or dissection. 3. Aortic atherosclerotic calcification. 4. Nodular area of consolidation within the subpleural right middle lobe decreased compared with 08/22/2023. Electronically signed by: Waddell Calk MD 10/20/2023 08:50 AM EDT RP Workstation: [id number]   US  SPLEEN (ABDOMEN LIMITED) Result Date: 10/19/2023 CLINICAL DATA:  Follow-up small splenic infarct or grade 2 laceration. No reported injury. EXAM: ULTRASOUND ABDOMEN LIMITED COMPARISON:  Abdomen and pelvis CT dated 10/19/2023 FINDINGS: The spleen is normal in size, measuring 11.3 x 10.1 x 6.2 cm. The recently suspected 1.4 cm cyst in the inferior aspect of the spleen is not well visualized, with a suggestion of a corresponding 2.7 cm oval, near anechoic, circumscribed area at that location on image number 11/20. This measured 0.0 cm  in corresponding dimensions on the recent CT. The superior aspect of the spleen is not well visualized in the region of the previously demonstrated 1.7 cm wedge-shaped area of low density due to shadowing produced by the aerated lung in the lateral costophrenic angle. IMPRESSION: 1. The recently demonstrated cyst in the inferior aspect of the spleen is not well visualized, with a suggestion of a corresponding 2.7 cm minimally complicated cyst at that location sonographically. 2. Due to shadowing produced by the aerated lung in the lateral costophrenic angle, the superior aspect of the spleen is not well visualized in the region of the previously demonstrated 1.7 cm wedge-shaped area  of low density. Based on the CT appearance and lack of a history of trauma, this is most likely a small area of infarction. Electronically Signed   By: Elspeth Bathe M.D.   On: 10/19/2023 14:33   CT ABDOMEN PELVIS W CONTRAST Result Date: 10/19/2023 CLINICAL DATA:  Abdominal pain, nausea and vomiting onset tonight. EXAM: CT ABDOMEN AND PELVIS WITH CONTRAST TECHNIQUE: Multidetector CT imaging of the abdomen and pelvis was performed using the standard protocol following bolus administration of intravenous contrast. RADIATION DOSE REDUCTION: This exam was performed according to the departmental dose-optimization program which includes automated exposure control, adjustment of the mA and/or kV according to patient size and/or use of iterative reconstruction technique. CONTRAST:  OMNIPAQUE  IOHEXOL  300 MG/ML  SOLN COMPARISON:  CTA chest 08/22/2023, portable chest 09/18/2023, CTA abdomen and pelvis 08/20/2022 and CT abdomen and pelvis with IV contrast 12/08/2021. FINDINGS: Lower chest: Still seen is a pleural-based consolidation laterally in the right middle lobe measuring 1.5 x 1.6 cm on the sixth image. Two months ago this measured 2.3 x 2.1 cm. Lung bases are otherwise clear with COPD changes. The cardiac size is normal. Small hiatal hernia. Hepatobiliary: The liver is 23 cm length, mildly steatotic without mass. The gallbladder and bile ducts are unremarkable. Pancreas: No abnormality. Spleen: 1.4 cm cyst of the lower medial spleen, Hounsfield density is 20. There is new demonstration of a 1.7 cm in length subcapsular hypodensity in the superior pole of the spleen. This is slightly wedge shaped on the coronal reformatting. This would either represent a grade 2 laceration or a splenic infarct. There is no perisplenic free hemorrhage or hematoma if this is a laceration. The rest of the spleen enhance homogeneously.  No splenomegaly. Adrenals/Urinary Tract: There is new wedge-shaped cortical scarring in the dorsal  midpole right kidney. There is no mass enhancement in the adrenal glands and both kidneys. There is no urinary stone or obstruction. There is no bladder thickening. Stomach/Bowel: Partially contracted stomach. Normal caliber unopacified small bowel. An appendix is not seen. No dilatation or wall thickening of the large bowel. In Vascular/Lymphatic: Aortic atherosclerosis. No enlarged abdominal or pelvic lymph nodes. Reproductive: Status post hysterectomy. No adnexal masses. Other: Interval new scarring from the skin surface to the vasculature left groin area with new stenting in the proximal left superficial femoral artery. There is rectus diastasis and outward protrusion with intact hernia repair. No incarcerated hernia is seen. There is no free fluid, free hemorrhage or free air. Musculoskeletal: Old posterior fusion hardware L4-5 with right hemilaminectomy. Multilevel degenerative changes with osteopenia. Since the August studies there is a new moderate upper plate anterior wedge compression fracture of the L1 vertebral body with 50% anterior height loss, negligible posterior height loss and 3 mm posterosuperior retropulsion. There is no adjacent soft tissue thickening, suggesting this is probably subacute. No  other new osseous findings. IMPRESSION: 1. New demonstration of a 1.7 cm in length subcapsular hypodensity in the superior pole of the spleen. This would either represent a grade 2 laceration or a splenic infarct. There is no perisplenic free hemorrhage or hematoma if this is a laceration. 2. New moderate upper plate anterior wedge compression fracture of the L1 vertebral body with 50% anterior height loss, 3 mm posterosuperior retropulsion. This is probably subacute given the lack of adjacent reactive soft tissue changes, but it is new from 2 months ago. 3. New wedge-shaped cortical scarring in the dorsal midpole right kidney. 4. Aortic atherosclerosis. 5. COPD. 6. Decreased size of a pleural-based  consolidation laterally in the right middle lobe, now 1.5 x 1.6 cm, previously 2.3 x 2.1 cm. 7. Small hiatal hernia. 8. Rectus diastasis and outward protrusion with intact hernia repair over the protruding portion. 9. Interval stenting in the proximal left superficial femoral artery with overlying scarring. Aortic Atherosclerosis (ICD10-I70.0). Electronically Signed   By: Francis Quam M.D.   On: 10/19/2023 05:02   DG Chest 1 View Result Date: 10/19/2023 EXAM: 1 VIEW(S) XRAY OF THE CHEST 10/19/2023 03:51:00 AM COMPARISON: Radiograph of the chest dated 09/18/2023 and CT of the chest dated 08/22/2023. CLINICAL HISTORY: cp. Pt arrived by ems d/t N/V / dizziness / and minor complaint of CP. Per pt, symptoms began tonight and woke up to N/V. Per ems, pt had stroke x1 month ago with right sided weakness and was dx with pneumonia x6 wks ago. FINDINGS: LUNGS AND PLEURA: Vague airspace opacity in right lung base likely represents residue from a wedge-shaped area of opacification/consolidation previously seen within the right middle lobe on the CT of the chest performed 08/22/2023. Continued follow up to ensure resolution is suggested. No pulmonary edema. No pleural effusion. No pneumothorax. HEART AND MEDIASTINUM: No acute abnormality of the cardiac and mediastinal silhouettes. BONES AND SOFT TISSUES: Cervical fusion hardware. IMPRESSION: 1. Vague airspace opacity at the right lung base, likely residual from prior right middle lobe consolidation; recommend follow-up to ensure resolution. Electronically signed by: Evalene Coho MD 10/19/2023 04:12 AM EDT RP Workstation: [id number]    Microbiology: Results for orders placed or performed during the hospital encounter of 10/19/23  Resp panel by RT-PCR (RSV, Flu A&B, Covid) Anterior Nasal Swab     Status: None   Collection Time: 10/19/23  3:45 AM   Specimen: Anterior Nasal Swab  Result Value Ref Range Status   SARS Coronavirus 2 by RT PCR NEGATIVE NEGATIVE Final     Comment: (NOTE) SARS-CoV-2 target nucleic acids are NOT DETECTED.  The SARS-CoV-2 RNA is generally detectable in upper respiratory specimens during the acute phase of infection. The lowest concentration of SARS-CoV-2 viral copies this assay can detect is 138 copies/mL. A negative result does not preclude SARS-Cov-2 infection and should not be used as the sole basis for treatment or other patient management decisions. A negative result may occur with  improper specimen collection/handling, submission of specimen other than nasopharyngeal swab, presence of viral mutation(s) within the areas targeted by this assay, and inadequate number of viral copies(<138 copies/mL). A negative result must be combined with clinical observations, patient history, and epidemiological information. The expected result is Negative.  Fact Sheet for Patients:  BloggerCourse.com  Fact Sheet for Healthcare Providers:  SeriousBroker.it  This test is no t yet approved or cleared by the [place]  FDA and  has been authorized for detection and/or diagnosis of SARS-CoV-2 by FDA under an Emergency Use  Authorization (EUA). This EUA will remain  in effect (meaning this test can be used) for the duration of the COVID-19 declaration under Section 564(b)(1) of the Act, 21 U.S.C.section 360bbb-3(b)(1), unless the authorization is terminated  or revoked sooner.       Influenza A by PCR NEGATIVE NEGATIVE Final   Influenza B by PCR NEGATIVE NEGATIVE Final    Comment: (NOTE) The Xpert Xpress SARS-CoV-2/FLU/RSV plus assay is intended as an aid in the diagnosis of influenza from Nasopharyngeal swab specimens and should not be used as a sole basis for treatment. Nasal washings and aspirates are unacceptable for Xpert Xpress SARS-CoV-2/FLU/RSV testing.  Fact Sheet for Patients: BloggerCourse.com  Fact Sheet for Healthcare  Providers: SeriousBroker.it  This test is not yet approved or cleared by the [place]  FDA and has been authorized for detection and/or diagnosis of SARS-CoV-2 by FDA under an Emergency Use Authorization (EUA). This EUA will remain in effect (meaning this test can be used) for the duration of the COVID-19 declaration under Section 564(b)(1) of the Act, 21 U.S.C. section 360bbb-3(b)(1), unless the authorization is terminated or revoked.     Resp Syncytial Virus by PCR NEGATIVE NEGATIVE Final    Comment: (NOTE) Fact Sheet for Patients: BloggerCourse.com  Fact Sheet for Healthcare Providers: SeriousBroker.it  This test is not yet approved or cleared by the [place]  FDA and has been authorized for detection and/or diagnosis of SARS-CoV-2 by FDA under an Emergency Use Authorization (EUA). This EUA will remain in effect (meaning this test can be used) for the duration of the COVID-19 declaration under Section 564(b)(1) of the Act, 21 U.S.C. section 360bbb-3(b)(1), unless the authorization is terminated or revoked.  Performed at [facility], [address] [place], [place] [address]     Labs: CBC: Recent Labs  Lab 10/19/23 0318 10/20/23 0341  WBC 11.4* 8.9  NEUTROABS 8.8*  --   HGB 13.7 12.4  HCT 42.0 39.0  MCV 85.9 86.7  PLT 354 327   Basic Metabolic Panel: Recent Labs  Lab 10/19/23 0318 10/20/23 0341  NA 136 133*  K 4.1 3.6  CL 98 100  CO2 23 25  GLUCOSE 156* 103*  BUN 12 15  CREATININE 0.55 0.78  CALCIUM  9.3 8.5*  MG 1.8  --    Liver Function Tests: Recent Labs  Lab 10/19/23 0318  AST 19  ALT 11  ALKPHOS 64  BILITOT 0.5  PROT 7.4  ALBUMIN 3.5   CBG: No results for input(s): GLUCAP in the last 168 hours.  Discharge time spent: greater than 30 minutes.  This record has been created using Conservation officer, historic buildings. Errors have been sought and  corrected,but may not always be located. Such creation errors do not reflect on the standard of care.   Signed: Amaryllis Dare, MD [facility] 10/20/2023

## 2023-10-21 ENCOUNTER — Telehealth: Payer: Self-pay

## 2023-10-21 NOTE — Transitions of Care (Post Inpatient/ED Visit) (Unsigned)
   10/21/2023  Name: [name] MRN: [id number] DOB: May 03, 1965  Today's TOC FU Call Status: Today's TOC FU Call Status:: Unsuccessful Call (1st Attempt) Unsuccessful Call (1st Attempt) Date: 10/21/23  Attempted to reach the patient regarding the most recent Inpatient/ED visit.  Follow Up Plan: Additional outreach attempts will be made to reach the patient to complete the Transitions of Care (Post Inpatient/ED visit) call.   Signature Julian Lemmings, LPN [facility] Nurse Health Advisor Direct Dial [phone]

## 2023-10-22 ENCOUNTER — Other Ambulatory Visit: Payer: Self-pay

## 2023-10-22 ENCOUNTER — Telehealth: Payer: Self-pay

## 2023-10-22 ENCOUNTER — Inpatient Hospital Stay
Admission: EM | Admit: 2023-10-22 | Discharge: 2023-10-26 | DRG: 056 | Disposition: A | Discharge status: Home Health | Attending: Obstetrics and Gynecology | Admitting: Obstetrics and Gynecology

## 2023-10-22 ENCOUNTER — Emergency Department

## 2023-10-22 ENCOUNTER — Encounter: Payer: Self-pay | Admitting: Emergency Medicine

## 2023-10-22 DIAGNOSIS — I69398 Other sequelae of cerebral infarction: Principal | ICD-10-CM

## 2023-10-22 DIAGNOSIS — G43909 Migraine, unspecified, not intractable, without status migrainosus: Secondary | ICD-10-CM | POA: Diagnosis not present

## 2023-10-22 DIAGNOSIS — Z833 Family history of diabetes mellitus: Secondary | ICD-10-CM

## 2023-10-22 DIAGNOSIS — Z720 Tobacco use: Secondary | ICD-10-CM | POA: Diagnosis present

## 2023-10-22 DIAGNOSIS — I69351 Hemiplegia and hemiparesis following cerebral infarction affecting right dominant side: Secondary | ICD-10-CM

## 2023-10-22 DIAGNOSIS — F17210 Nicotine dependence, cigarettes, uncomplicated: Secondary | ICD-10-CM | POA: Diagnosis not present

## 2023-10-22 DIAGNOSIS — E1151 Type 2 diabetes mellitus with diabetic peripheral angiopathy without gangrene: Secondary | ICD-10-CM | POA: Diagnosis not present

## 2023-10-22 DIAGNOSIS — Z880 Allergy status to penicillin: Secondary | ICD-10-CM

## 2023-10-22 DIAGNOSIS — Z7902 Long term (current) use of antithrombotics/antiplatelets: Secondary | ICD-10-CM | POA: Diagnosis not present

## 2023-10-22 DIAGNOSIS — E559 Vitamin D deficiency, unspecified: Secondary | ICD-10-CM | POA: Diagnosis not present

## 2023-10-22 DIAGNOSIS — Z7982 Long term (current) use of aspirin: Secondary | ICD-10-CM | POA: Diagnosis not present

## 2023-10-22 DIAGNOSIS — Z881 Allergy status to other antibiotic agents status: Secondary | ICD-10-CM

## 2023-10-22 DIAGNOSIS — E785 Hyperlipidemia, unspecified: Secondary | ICD-10-CM | POA: Diagnosis not present

## 2023-10-22 DIAGNOSIS — Z1152 Encounter for screening for COVID-19: Secondary | ICD-10-CM

## 2023-10-22 DIAGNOSIS — R569 Unspecified convulsions: Principal | ICD-10-CM | POA: Diagnosis present

## 2023-10-22 DIAGNOSIS — R911 Solitary pulmonary nodule: Secondary | ICD-10-CM | POA: Diagnosis not present

## 2023-10-22 DIAGNOSIS — F0153 Vascular dementia, unspecified severity, with mood disturbance: Secondary | ICD-10-CM | POA: Diagnosis present

## 2023-10-22 DIAGNOSIS — J9611 Chronic respiratory failure with hypoxia: Secondary | ICD-10-CM | POA: Diagnosis present

## 2023-10-22 DIAGNOSIS — M797 Fibromyalgia: Secondary | ICD-10-CM | POA: Diagnosis not present

## 2023-10-22 DIAGNOSIS — J440 Chronic obstructive pulmonary disease with acute lower respiratory infection: Secondary | ICD-10-CM | POA: Diagnosis present

## 2023-10-22 DIAGNOSIS — G9340 Encephalopathy, unspecified: Secondary | ICD-10-CM

## 2023-10-22 DIAGNOSIS — Z89422 Acquired absence of other left toe(s): Secondary | ICD-10-CM

## 2023-10-22 DIAGNOSIS — I10 Essential (primary) hypertension: Secondary | ICD-10-CM | POA: Diagnosis present

## 2023-10-22 DIAGNOSIS — K219 Gastro-esophageal reflux disease without esophagitis: Secondary | ICD-10-CM | POA: Diagnosis present

## 2023-10-22 DIAGNOSIS — F329 Major depressive disorder, single episode, unspecified: Secondary | ICD-10-CM | POA: Diagnosis present

## 2023-10-22 DIAGNOSIS — G894 Chronic pain syndrome: Secondary | ICD-10-CM | POA: Diagnosis not present

## 2023-10-22 DIAGNOSIS — Z8249 Family history of ischemic heart disease and other diseases of the circulatory system: Secondary | ICD-10-CM

## 2023-10-22 DIAGNOSIS — Z79899 Other long term (current) drug therapy: Secondary | ICD-10-CM

## 2023-10-22 DIAGNOSIS — E669 Obesity, unspecified: Secondary | ICD-10-CM | POA: Diagnosis present

## 2023-10-22 DIAGNOSIS — D735 Infarction of spleen: Secondary | ICD-10-CM | POA: Diagnosis not present

## 2023-10-22 DIAGNOSIS — Z7985 Long-term (current) use of injectable non-insulin antidiabetic drugs: Secondary | ICD-10-CM

## 2023-10-22 DIAGNOSIS — I2510 Atherosclerotic heart disease of native coronary artery without angina pectoris: Secondary | ICD-10-CM | POA: Diagnosis not present

## 2023-10-22 DIAGNOSIS — J449 Chronic obstructive pulmonary disease, unspecified: Secondary | ICD-10-CM | POA: Diagnosis not present

## 2023-10-22 DIAGNOSIS — I739 Peripheral vascular disease, unspecified: Secondary | ICD-10-CM | POA: Diagnosis present

## 2023-10-22 DIAGNOSIS — G40909 Epilepsy, unspecified, not intractable, without status epilepticus: Secondary | ICD-10-CM

## 2023-10-22 DIAGNOSIS — I951 Orthostatic hypotension: Secondary | ICD-10-CM | POA: Diagnosis not present

## 2023-10-22 DIAGNOSIS — J189 Pneumonia, unspecified organism: Secondary | ICD-10-CM | POA: Diagnosis present

## 2023-10-22 DIAGNOSIS — I6930 Unspecified sequelae of cerebral infarction: Secondary | ICD-10-CM

## 2023-10-22 DIAGNOSIS — Z7901 Long term (current) use of anticoagulants: Secondary | ICD-10-CM

## 2023-10-22 DIAGNOSIS — Z6832 Body mass index (BMI) 32.0-32.9, adult: Secondary | ICD-10-CM

## 2023-10-22 DIAGNOSIS — Z96653 Presence of artificial knee joint, bilateral: Secondary | ICD-10-CM | POA: Diagnosis present

## 2023-10-22 DIAGNOSIS — I69951 Hemiplegia and hemiparesis following unspecified cerebrovascular disease affecting right dominant side: Secondary | ICD-10-CM

## 2023-10-22 DIAGNOSIS — G8929 Other chronic pain: Secondary | ICD-10-CM | POA: Diagnosis present

## 2023-10-22 DIAGNOSIS — K760 Fatty (change of) liver, not elsewhere classified: Secondary | ICD-10-CM | POA: Diagnosis not present

## 2023-10-22 DIAGNOSIS — Z66 Do not resuscitate: Secondary | ICD-10-CM | POA: Diagnosis present

## 2023-10-22 LAB — COMPREHENSIVE METABOLIC PANEL WITH GFR
ALT: 11 U/L (ref 0–44)
AST: 21 U/L (ref 15–41)
Albumin: 3.7 g/dL (ref 3.5–5.0)
Alkaline Phosphatase: 60 U/L (ref 38–126)
Anion gap: 15 (ref 5–15)
BUN: 10 mg/dL (ref 6–20)
CO2: 20 mmol/L — ABNORMAL LOW (ref 22–32)
Calcium: 9.2 mg/dL (ref 8.9–10.3)
Chloride: 101 mmol/L (ref 98–111)
Creatinine, Ser: 0.72 mg/dL (ref 0.44–1.00)
GFR, Estimated: >60 mL/min (ref >60)
Glucose, Bld: 185 mg/dL — ABNORMAL HIGH (ref 70–99)
Potassium: 3.9 mmol/L (ref 3.5–5.1)
Sodium: 136 mmol/L (ref 135–145)
Total Bilirubin: 0.6 mg/dL (ref 0.0–1.2)
Total Protein: 7.3 g/dL (ref 6.5–8.1)

## 2023-10-22 LAB — CBC WITH DIFFERENTIAL/PLATELET
Abs Immature Granulocytes: 0.05 K/uL (ref 0.00–0.07)
Basophils Absolute: 0 K/uL (ref 0.0–0.1)
Basophils Relative: 0 %
Eosinophils Absolute: 0 K/uL (ref 0.0–0.5)
Eosinophils Relative: 0 %
HCT: 42.7 % (ref 36.0–46.0)
Hemoglobin: 14 g/dL (ref 12.0–15.0)
Immature Granulocytes: 1 %
Lymphocytes Relative: 14 %
Lymphs Abs: 1.5 K/uL (ref 0.7–4.0)
MCH: 28.1 pg (ref 26.0–34.0)
MCHC: 32.8 g/dL (ref 30.0–36.0)
MCV: 85.6 fL (ref 80.0–100.0)
Monocytes Absolute: 0.6 K/uL (ref 0.1–1.0)
Monocytes Relative: 5 %
Neutro Abs: 8.6 K/uL — ABNORMAL HIGH (ref 1.7–7.7)
Neutrophils Relative %: 80 %
Platelets: 340 K/uL (ref 150–400)
RBC: 4.99 MIL/uL (ref 3.87–5.11)
RDW: 14.6 % (ref 11.5–15.5)
WBC: 10.8 K/uL — ABNORMAL HIGH (ref 4.0–10.5)
nRBC: 0.2 % (ref 0.0–0.2)

## 2023-10-22 MED ORDER — MIDODRINE HCL 5 MG PO TABS
5.0000 mg | ORAL_TABLET | Freq: Once | ORAL | Status: AC
  Administered 2023-10-22: 5 mg via ORAL
  Filled 2023-10-22: qty 1

## 2023-10-22 MED ORDER — ONDANSETRON HCL 4 MG/2ML IJ SOLN
4.0000 mg | Freq: Once | INTRAMUSCULAR | Status: AC
  Administered 2023-10-22: 4 mg via INTRAVENOUS
  Filled 2023-10-22: qty 2

## 2023-10-22 MED ORDER — LEVETIRACETAM (KEPPRA) 500 MG/5 ML ADULT IV PUSH
1500.0000 mg | Freq: Once | INTRAVENOUS | Status: AC
Start: 2023-10-22 — End: 2023-10-22
  Administered 2023-10-22: 1500 mg via INTRAVENOUS
  Filled 2023-10-22: qty 15

## 2023-10-22 NOTE — Transitions of Care (Post Inpatient/ED Visit) (Unsigned)
   10/22/2023  Name: [name] MRN: [id number] DOB: 11-28-1965  Today's TOC FU Call Status: Today's TOC FU Call Status:: Unsuccessful Call (2nd Attempt) Unsuccessful Call (1st Attempt) Date: 10/21/23 Unsuccessful Call (2nd Attempt) Date: 10/22/23  Attempted to reach the patient regarding the most recent Inpatient/ED visit.  Follow Up Plan: Additional outreach attempts will be made to reach the patient to complete the Transitions of Care (Post Inpatient/ED visit) call.   Signature Julian Lemmings, LPN [facility] Nurse Health Advisor Direct Dial [phone]

## 2023-10-22 NOTE — Telephone Encounter (Signed)
Verbal orders given to Kelly 

## 2023-10-22 NOTE — ED Triage Notes (Signed)
 Pt to ED via EMS from home c/o seizures tonight.  Per EMS husband at home witnessed seizures lasting approx 1 min, post ictal, no hx of seizures per husband.  Pt has hx of stroke with right side deficits.  Recently admitted to hospital with splenic lesion.  No obvious injury to tongue or incontinence.  Patient oriented to self but not to place or situation.  EMS vitals: 159 CBG, 137/75 BP, 98.5 axillary.

## 2023-10-22 NOTE — Telephone Encounter (Signed)
 Copied from CRM [id number]. Topic: Clinical - Home Health Verbal Orders >> Oct 22, 2023  3:19 PM Tiffini S wrote: Caller/Agency: Jadea Shiffer Number: [phone] Service Requested: Skilled Nursing with [facility]  Frequency: For a evaluation to determine the frequency Any new concerns about the patient? Yes, patient is bed written from two strokes and is unable to walk

## 2023-10-22 NOTE — Telephone Encounter (Signed)
Okay for verbal orders as requested? 

## 2023-10-22 NOTE — Telephone Encounter (Signed)
 Copied from CRM [id number]. Topic: Clinical - Home Health Verbal Orders >> Oct 22, 2023  2:45 PM Joesph NOVAK wrote: Caller/Agency: kelly from [facility]  Callback Number: [phone] Service Requested: Physical Therapy Frequency: once a week for 9 weeks Any new concerns about the patient? No

## 2023-10-22 NOTE — ED Provider Notes (Signed)
 [facility] Provider Note    None    (approximate)   History   Seizures   HPI  [name] is a 58 y.o. female  with medical history significant of recurrent strokes with chronic residual right-sided weakness, HTN, HLD, PVD, IIDM, recent admission and discharged on 10/7 for splenic infarct on Eliquis who presents today with witnessed seizure.  Patient does not have a prior history of seizures.  She is not on any antiepileptics.  Per EMS report her husband witnessed a 1 minute generalized tonic-clonic seizure.  Patient arrives reports that she just feels unwell.  She is unable to tell me what this means or justify it further.  She denies any chest pain.  She reports compliance with medications      Physical Exam   Triage Vital Signs: ED Triage Vitals  Encounter Vitals Group     BP      Girls Systolic BP Percentile      Girls Diastolic BP Percentile      Boys Systolic BP Percentile      Boys Diastolic BP Percentile      Pulse      Resp      Temp      Temp src      SpO2      Weight      Height      Head Circumference      Peak Flow      Pain Score      Pain Loc      Pain Education      Exclude from Growth Chart     Most recent vital signs: Vitals:   10/22/23 2136 10/22/23 2215  BP: 127/79   Pulse: (!) 102   Resp: 20   Temp: 98.1 F (36.7 C)   SpO2: 100% 100%    Nursing Triage Note reviewed. Vital signs reviewed and patients oxygen saturation is normoxic  General: Patient is well nourished, well developed, awake and alert, appears unwell Head: Normocephalic  Eyes: Normal inspection, extraocular muscles intact, no conjunctival pallor Ear, nose, throat: Normal external exam Neck: Normal range of motion Respiratory: Patient is in no respiratory distress, lungs CTAB Cardiovascular: Patient is tachycardic, RR GI: Abd SNT with no guarding or rebound  Back: Normal inspection of the back with good strength and range of motion  throughout all ext Extremities: pulses intact with good cap refills, no LE pitting edema or calf tenderness Neuro: The patient is alert and oriented to person, not to place or time, easily confused not moving right side of the body which is consistent with prior exams, moving left side   ED Results / Procedures / Treatments   Labs (all labs ordered are listed, but only abnormal results are displayed) Labs Reviewed  CBC WITH DIFFERENTIAL/PLATELET - Abnormal; Notable for the following components:      Result Value   WBC 10.8 (*)    Neutro Abs 8.6 (*)    All other components within normal limits  COMPREHENSIVE METABOLIC PANEL WITH GFR - Abnormal; Notable for the following components:   CO2 20 (*)    Glucose, Bld 185 (*)    All other components within normal limits  URINE DRUG SCREEN, QUALITATIVE ([facility] ONLY)  URINALYSIS, COMPLETE (UACMP) WITH MICROSCOPIC     EKG EKG and rhythm strip are interpreted by myself:   EKG: Tachycardic sinus rhythm] at heart rate of 103, normal QRS duration, QTc [460, nonspecific ST segments and T  waves no ectopy EKG not consistent with Acute STEMI Rhythm strip: Tachycardic sinus rhythm in lead II   RADIOLOGY CT head without contrast: No intracranial hemorrhage, independent review interpretation and radiologist agrees CT C-spine: No acute abnormality   PROCEDURES:  Critical Care performed: No  Procedures   MEDICATIONS ORDERED IN ED: Medications  midodrine  (PROAMATINE ) tablet 5 mg (5 mg Oral Given 10/22/23 2250)  levETIRAcetam (KEPPRA) undiluted injection 1,500 mg (1,500 mg Intravenous Given 10/22/23 2241)  ondansetron  (ZOFRAN ) injection 4 mg (4 mg Intravenous Given 10/22/23 2241)     IMPRESSION / MDM / ASSESSMENT AND PLAN / ED COURSE                                Differential diagnosis includes, but is not limited to, seizure, intracranial hemorrhage, electrolyte derangement anemia, UTI, arrhythmia  ED course: Patient presents  encephalopathic but with no new focal neurological deficits.  EKG demonstrated no evidence of acute arrhythmia.  Had no profound electrolyte derangements has only a mild leukocytosis which I suspect is reactive.  CT head and C-spine were unremarkable.  Urinalysis is pending.  Patient has not yet returned to baseline.  I have ordered 1500 of Keppra as I do not see any antiepileptics in her chart.  I have administered Zofran  for some nausea and she has gotten a dose of midodrine  on arrival.  Patient called into hospitalist as she will likely need a neurology consult and further monitoring and she has not returned to baseline; patient may need to be signed out to oncoming physician pending hospitalist return call   Clinical Course as of 10/22/23 2322  Thu Oct 22, 2023  2303 Sodium: 136 No electrolyte derangements [HD]  2303 CBC with Differential(!) No anemia [HD]  2303 CT Head Wo Contrast Unremarkable [HD]  2303 CT Cervical Spine Wo Contrast Unremarkable [HD]    Clinical Course User Index [HD] Nicholaus Rolland BRAVO, MD   -- Risk: 5 This patient has a high risk of morbidity due to further diagnostic testing or treatment. Rationale: This patient's evaluation and management involve a high risk of morbidity due to the potential severity of presenting symptoms, need for diagnostic testing, and/or initiation of treatment that may require close monitoring. The differential includes conditions with potential for significant deterioration or requiring escalation of care. Treatment decisions in the ED, including medication administration, procedural interventions, or disposition planning, reflect this level of risk. COPA: 5 The patient has the following acute or chronic illness/injury that poses a possible threat to life or bodily function: [X] : The patient has a potentially serious acute condition or an acute exacerbation of a chronic illness requiring urgent evaluation and management in the Emergency  Department. The clinical presentation necessitates immediate consideration of life-threatening or function-threatening diagnoses, even if they are ultimately ruled out.   FINAL CLINICAL IMPRESSION(S) / ED DIAGNOSES   Final diagnoses:  Seizure (HCC)  Encephalopathy, unspecified type     Rx / DC Orders   ED Discharge Orders     None        Note:  This document was prepared using Dragon voice recognition software and may include unintentional dictation errors.   Nicholaus Rolland BRAVO, MD 10/22/23 2322

## 2023-10-23 ENCOUNTER — Observation Stay

## 2023-10-23 ENCOUNTER — Inpatient Hospital Stay

## 2023-10-23 DIAGNOSIS — E119 Type 2 diabetes mellitus without complications: Secondary | ICD-10-CM | POA: Diagnosis not present

## 2023-10-23 DIAGNOSIS — Z6832 Body mass index (BMI) 32.0-32.9, adult: Secondary | ICD-10-CM | POA: Diagnosis not present

## 2023-10-23 DIAGNOSIS — Z8249 Family history of ischemic heart disease and other diseases of the circulatory system: Secondary | ICD-10-CM | POA: Diagnosis not present

## 2023-10-23 DIAGNOSIS — F17210 Nicotine dependence, cigarettes, uncomplicated: Secondary | ICD-10-CM | POA: Diagnosis present

## 2023-10-23 DIAGNOSIS — J9611 Chronic respiratory failure with hypoxia: Secondary | ICD-10-CM | POA: Diagnosis present

## 2023-10-23 DIAGNOSIS — I69398 Other sequelae of cerebral infarction: Secondary | ICD-10-CM | POA: Diagnosis not present

## 2023-10-23 DIAGNOSIS — G8929 Other chronic pain: Secondary | ICD-10-CM | POA: Diagnosis present

## 2023-10-23 DIAGNOSIS — R509 Fever, unspecified: Secondary | ICD-10-CM

## 2023-10-23 DIAGNOSIS — E669 Obesity, unspecified: Secondary | ICD-10-CM | POA: Diagnosis present

## 2023-10-23 DIAGNOSIS — R569 Unspecified convulsions: Principal | ICD-10-CM

## 2023-10-23 DIAGNOSIS — Z8673 Personal history of transient ischemic attack (TIA), and cerebral infarction without residual deficits: Secondary | ICD-10-CM | POA: Diagnosis not present

## 2023-10-23 DIAGNOSIS — E785 Hyperlipidemia, unspecified: Secondary | ICD-10-CM | POA: Diagnosis present

## 2023-10-23 DIAGNOSIS — Z7901 Long term (current) use of anticoagulants: Secondary | ICD-10-CM | POA: Diagnosis not present

## 2023-10-23 DIAGNOSIS — I10 Essential (primary) hypertension: Secondary | ICD-10-CM | POA: Diagnosis not present

## 2023-10-23 DIAGNOSIS — G40909 Epilepsy, unspecified, not intractable, without status epilepticus: Secondary | ICD-10-CM

## 2023-10-23 DIAGNOSIS — Z1152 Encounter for screening for COVID-19: Secondary | ICD-10-CM | POA: Diagnosis not present

## 2023-10-23 DIAGNOSIS — K219 Gastro-esophageal reflux disease without esophagitis: Secondary | ICD-10-CM | POA: Diagnosis present

## 2023-10-23 DIAGNOSIS — F329 Major depressive disorder, single episode, unspecified: Secondary | ICD-10-CM | POA: Diagnosis present

## 2023-10-23 DIAGNOSIS — E1151 Type 2 diabetes mellitus with diabetic peripheral angiopathy without gangrene: Secondary | ICD-10-CM | POA: Diagnosis present

## 2023-10-23 DIAGNOSIS — I69351 Hemiplegia and hemiparesis following cerebral infarction affecting right dominant side: Secondary | ICD-10-CM | POA: Diagnosis not present

## 2023-10-23 DIAGNOSIS — D735 Infarction of spleen: Secondary | ICD-10-CM | POA: Diagnosis not present

## 2023-10-23 DIAGNOSIS — F17200 Nicotine dependence, unspecified, uncomplicated: Secondary | ICD-10-CM | POA: Diagnosis not present

## 2023-10-23 DIAGNOSIS — Z66 Do not resuscitate: Secondary | ICD-10-CM | POA: Diagnosis present

## 2023-10-23 DIAGNOSIS — Z833 Family history of diabetes mellitus: Secondary | ICD-10-CM | POA: Diagnosis not present

## 2023-10-23 DIAGNOSIS — Z79899 Other long term (current) drug therapy: Secondary | ICD-10-CM | POA: Diagnosis not present

## 2023-10-23 DIAGNOSIS — J189 Pneumonia, unspecified organism: Secondary | ICD-10-CM | POA: Diagnosis present

## 2023-10-23 DIAGNOSIS — Z7985 Long-term (current) use of injectable non-insulin antidiabetic drugs: Secondary | ICD-10-CM | POA: Diagnosis not present

## 2023-10-23 DIAGNOSIS — F0153 Vascular dementia, unspecified severity, with mood disturbance: Secondary | ICD-10-CM | POA: Diagnosis present

## 2023-10-23 DIAGNOSIS — J440 Chronic obstructive pulmonary disease with acute lower respiratory infection: Secondary | ICD-10-CM | POA: Diagnosis present

## 2023-10-23 DIAGNOSIS — I6381 Other cerebral infarction due to occlusion or stenosis of small artery: Secondary | ICD-10-CM | POA: Diagnosis not present

## 2023-10-23 DIAGNOSIS — J449 Chronic obstructive pulmonary disease, unspecified: Secondary | ICD-10-CM | POA: Diagnosis not present

## 2023-10-23 DIAGNOSIS — Z880 Allergy status to penicillin: Secondary | ICD-10-CM | POA: Diagnosis not present

## 2023-10-23 LAB — CBC WITH DIFFERENTIAL/PLATELET
Abs Immature Granulocytes: 0.06 K/uL (ref 0.00–0.07)
Basophils Absolute: 0 K/uL (ref 0.0–0.1)
Basophils Relative: 0 %
Eosinophils Absolute: 0 K/uL (ref 0.0–0.5)
Eosinophils Relative: 0 %
HCT: 41 % (ref 36.0–46.0)
Hemoglobin: 13.2 g/dL (ref 12.0–15.0)
Immature Granulocytes: 0 %
Lymphocytes Relative: 23 %
Lymphs Abs: 3.2 K/uL (ref 0.7–4.0)
MCH: 27.6 pg (ref 26.0–34.0)
MCHC: 32.2 g/dL (ref 30.0–36.0)
MCV: 85.8 fL (ref 80.0–100.0)
Monocytes Absolute: 0.8 K/uL (ref 0.1–1.0)
Monocytes Relative: 6 %
Neutro Abs: 9.6 K/uL — ABNORMAL HIGH (ref 1.7–7.7)
Neutrophils Relative %: 71 %
Platelets: 377 K/uL (ref 150–400)
RBC: 4.78 MIL/uL (ref 3.87–5.11)
RDW: 14.5 % (ref 11.5–15.5)
WBC: 13.7 K/uL — ABNORMAL HIGH (ref 4.0–10.5)
nRBC: 0 % (ref 0.0–0.2)

## 2023-10-23 LAB — COMPREHENSIVE METABOLIC PANEL WITH GFR
ALT: 10 U/L (ref 0–44)
ALT: 11 U/L (ref 0–44)
AST: 16 U/L (ref 15–41)
AST: 18 U/L (ref 15–41)
Albumin: 3.6 g/dL (ref 3.5–5.0)
Albumin: 3.2 g/dL — ABNORMAL LOW (ref 3.5–5.0)
Alkaline Phosphatase: 57 U/L (ref 38–126)
Alkaline Phosphatase: 48 U/L (ref 38–126)
Anion gap: 14 (ref 5–15)
Anion gap: 12 (ref 5–15)
BUN: 15 mg/dL (ref 6–20)
BUN: 12 mg/dL (ref 6–20)
CO2: 21 mmol/L — ABNORMAL LOW (ref 22–32)
CO2: 21 mmol/L — ABNORMAL LOW (ref 22–32)
Calcium: 9 mg/dL (ref 8.9–10.3)
Calcium: 8.4 mg/dL — ABNORMAL LOW (ref 8.9–10.3)
Chloride: 101 mmol/L (ref 98–111)
Chloride: 101 mmol/L (ref 98–111)
Creatinine, Ser: 0.66 mg/dL (ref 0.44–1.00)
Creatinine, Ser: 0.67 mg/dL (ref 0.44–1.00)
GFR, Estimated: >60 mL/min (ref >60)
GFR, Estimated: >60 mL/min (ref >60)
Glucose, Bld: 127 mg/dL — ABNORMAL HIGH (ref 70–99)
Glucose, Bld: 161 mg/dL — ABNORMAL HIGH (ref 70–99)
Potassium: 3.7 mmol/L (ref 3.5–5.1)
Potassium: 4 mmol/L (ref 3.5–5.1)
Sodium: 136 mmol/L (ref 135–145)
Sodium: 134 mmol/L — ABNORMAL LOW (ref 135–145)
Total Bilirubin: 0.6 mg/dL (ref 0.0–1.2)
Total Bilirubin: 1.1 mg/dL (ref 0.0–1.2)
Total Protein: 7.3 g/dL (ref 6.5–8.1)
Total Protein: 6.5 g/dL (ref 6.5–8.1)

## 2023-10-23 LAB — URINALYSIS, COMPLETE (UACMP) WITH MICROSCOPIC
Bilirubin Urine: NEGATIVE
Glucose, UA: NEGATIVE mg/dL
Hgb urine dipstick: NEGATIVE
Ketones, ur: 5 mg/dL — AB
Leukocytes,Ua: NEGATIVE
Nitrite: NEGATIVE
Protein, ur: 100 mg/dL — AB
Specific Gravity, Urine: 1.032 — ABNORMAL HIGH (ref 1.005–1.030)
pH: 5 (ref 5.0–8.0)

## 2023-10-23 LAB — PROTEIN AND GLUCOSE, CSF
Glucose, CSF: 66 mg/dL (ref 40–70)
Total  Protein, CSF: 52 mg/dL — ABNORMAL HIGH (ref 15–45)

## 2023-10-23 LAB — APTT: aPTT: 31 s (ref 24–36)

## 2023-10-23 LAB — CSF CELL COUNT WITH DIFFERENTIAL
RBC Count, CSF: 5 /mm3 — ABNORMAL HIGH (ref 0–3)
Tube #: 3
WBC, CSF: 2 /mm3 (ref 0–5)

## 2023-10-23 LAB — CBG MONITORING, ED
Glucose-Capillary: 167 mg/dL — ABNORMAL HIGH (ref 70–99)
Glucose-Capillary: 104 mg/dL — ABNORMAL HIGH (ref 70–99)

## 2023-10-23 LAB — URINE DRUG SCREEN, QUALITATIVE (ARMC ONLY)
Amphetamines, Ur Screen: NOT DETECTED
Barbiturates, Ur Screen: NOT DETECTED
Benzodiazepine, Ur Scrn: NOT DETECTED
Cannabinoid 50 Ng, Ur ~~LOC~~: POSITIVE — AB
Cocaine Metabolite,Ur ~~LOC~~: NOT DETECTED
MDMA (Ecstasy)Ur Screen: NOT DETECTED
Methadone Scn, Ur: NOT DETECTED
Opiate, Ur Screen: POSITIVE — AB
Phencyclidine (PCP) Ur S: NOT DETECTED
Tricyclic, Ur Screen: NOT DETECTED

## 2023-10-23 LAB — RESP PANEL BY RT-PCR (RSV, FLU A&B, COVID)  RVPGX2
Influenza A by PCR: NEGATIVE
Influenza B by PCR: NEGATIVE
Resp Syncytial Virus by PCR: NEGATIVE
SARS Coronavirus 2 by RT PCR: NEGATIVE

## 2023-10-23 LAB — PROTIME-INR
INR: 1.2 (ref 0.8–1.2)
Prothrombin Time: 15.6 s — ABNORMAL HIGH (ref 11.4–15.2)

## 2023-10-23 LAB — LACTIC ACID, PLASMA
Lactic Acid, Venous: 2.4 mmol/L (ref 0.5–1.9)
Lactic Acid, Venous: 1.7 mmol/L (ref 0.5–1.9)

## 2023-10-23 MED ORDER — LORAZEPAM 2 MG/ML IJ SOLN
1.0000 mg | INTRAMUSCULAR | Status: AC | PRN
  Administered 2023-10-23 (×2): 1 mg via INTRAVENOUS
  Filled 2023-10-23 (×2): qty 1

## 2023-10-23 MED ORDER — LORAZEPAM 2 MG/ML IJ SOLN
1.0000 mg | Freq: Once | INTRAMUSCULAR | Status: AC
  Administered 2023-10-23: 1 mg via INTRAVENOUS

## 2023-10-23 MED ORDER — SODIUM CHLORIDE 0.9 % IV SOLN
1.0000 g | Freq: Four times a day (QID) | INTRAVENOUS | Status: DC
  Filled 2023-10-23 (×2): qty 1000

## 2023-10-23 MED ORDER — LORAZEPAM 2 MG/ML IJ SOLN
2.0000 mg | INTRAMUSCULAR | Status: DC | PRN
  Administered 2023-10-23: 2 mg via INTRAVENOUS
  Filled 2023-10-23 (×2): qty 1

## 2023-10-23 MED ORDER — ACETAMINOPHEN 650 MG RE SUPP
650.0000 mg | RECTAL | Status: DC | PRN
  Administered 2023-10-23 (×2): 650 mg via RECTAL
  Filled 2023-10-23 (×2): qty 2

## 2023-10-23 MED ORDER — ALBUTEROL SULFATE (2.5 MG/3ML) 0.083% IN NEBU: 2.5000 mg | INHALATION_SOLUTION | RESPIRATORY_TRACT | Status: DC | PRN

## 2023-10-23 MED ORDER — ONDANSETRON HCL 4 MG/2ML IJ SOLN: 4.0000 mg | Freq: Four times a day (QID) | INTRAMUSCULAR | Status: DC | PRN

## 2023-10-23 MED ORDER — ORAL CARE MOUTH RINSE: 15.0000 mL | OROMUCOSAL | Status: DC | PRN

## 2023-10-23 MED ORDER — SODIUM CHLORIDE 0.9 % IV SOLN
75.0000 mL/h | INTRAVENOUS | Status: AC
  Administered 2023-10-23: 75 mL/h via INTRAVENOUS

## 2023-10-23 MED ORDER — INSULIN ASPART 100 UNIT/ML IJ SOLN: 0.0000 [IU] | Freq: Three times a day (TID) | INTRAMUSCULAR | Status: DC

## 2023-10-23 MED ORDER — SODIUM CHLORIDE 0.9 % IV SOLN: 2.0000 g | INTRAVENOUS | Status: DC

## 2023-10-23 MED ORDER — VANCOMYCIN HCL 2000 MG/400ML IV SOLN
2000.0000 mg | Freq: Once | INTRAVENOUS | Status: AC
  Administered 2023-10-23: 2000 mg via INTRAVENOUS
  Filled 2023-10-23: qty 400

## 2023-10-23 MED ORDER — ORAL CARE MOUTH RINSE
15.0000 mL | OROMUCOSAL | Status: DC
  Administered 2023-10-23: 15 mL via OROMUCOSAL
  Filled 2023-10-23 (×22): qty 15

## 2023-10-23 MED ORDER — LEVETIRACETAM (KEPPRA) 500 MG/5 ML ADULT IV PUSH
1000.0000 mg | Freq: Two times a day (BID) | INTRAVENOUS | Status: DC
  Administered 2023-10-23 – 2023-10-24 (×3): 1000 mg via INTRAVENOUS
  Filled 2023-10-23 (×4): qty 10

## 2023-10-23 MED ORDER — APIXABAN 5 MG PO TABS
5.0000 mg | ORAL_TABLET | Freq: Two times a day (BID) | ORAL | Status: DC
Start: 2023-10-23 — End: 2023-10-24
  Administered 2023-10-23 (×2): 5 mg via ORAL
  Filled 2023-10-23 (×2): qty 1

## 2023-10-23 MED ORDER — SODIUM CHLORIDE 0.9 % IV SOLN
2.0000 g | INTRAVENOUS | Status: DC
  Administered 2023-10-23 – 2023-10-24 (×3): 2 g via INTRAVENOUS
  Filled 2023-10-23 (×6): qty 2000

## 2023-10-23 MED ORDER — SODIUM CHLORIDE 0.9 % IV BOLUS (SEPSIS)
1000.0000 mL | Freq: Once | INTRAVENOUS | Status: AC
  Administered 2023-10-23: 1000 mL via INTRAVENOUS

## 2023-10-23 MED ORDER — ONDANSETRON HCL 4 MG PO TABS: 4.0000 mg | ORAL_TABLET | Freq: Four times a day (QID) | ORAL | Status: DC | PRN

## 2023-10-23 MED ORDER — ACYCLOVIR SODIUM 50 MG/ML IV SOLN
10.0000 mg/kg | Freq: Three times a day (TID) | INTRAVENOUS | Status: DC
  Administered 2023-10-23 – 2023-10-24 (×2): 715 mg via INTRAVENOUS
  Filled 2023-10-23 (×4): qty 14.3

## 2023-10-23 MED ORDER — ACETAMINOPHEN 325 MG PO TABS
650.0000 mg | ORAL_TABLET | ORAL | Status: DC | PRN
  Administered 2023-10-25 – 2023-10-26 (×2): 650 mg via ORAL
  Filled 2023-10-23 (×2): qty 2

## 2023-10-23 MED ORDER — VANCOMYCIN HCL IN DEXTROSE 1-5 GM/200ML-% IV SOLN
1000.0000 mg | Freq: Three times a day (TID) | INTRAVENOUS | Status: DC
  Administered 2023-10-23 – 2023-10-24 (×2): 1000 mg via INTRAVENOUS
  Filled 2023-10-23 (×2): qty 200

## 2023-10-23 MED ORDER — SODIUM CHLORIDE 0.9 % IV SOLN
2.0000 g | Freq: Two times a day (BID) | INTRAVENOUS | Status: DC
  Administered 2023-10-23 – 2023-10-24 (×2): 2 g via INTRAVENOUS
  Filled 2023-10-23 (×2): qty 20

## 2023-10-23 MED ORDER — SODIUM CHLORIDE 0.9 % IV SOLN: INTRAVENOUS | Status: DC

## 2023-10-23 NOTE — Consult Note (Addendum)
 NEUROLOGY CONSULT NOTE   Date of service: October 23, 2023 Patient Name: [name] MRN:  [id number] DOB:  Apr 14, 1965 Chief Complaint: Seizure Requesting Provider: Roann Gouty, MD  History of Present Illness  [name] is a 58 y.o. female with hx of diabetes, hypertension, hyperlipidemia, previous stroke who presents with new onset seizure.  She states that she has been having headaches.Yesterday she had a seizure and states that she feels she has been confused since that time. She also endorses neck pain that worsens with movement. Overnight she spiked a fever and was therefore started on broad spectrum antibiotics.   Past History   Past Medical History:  Diagnosis Date   Anxiety    Cancer (HCC)    a spot on liver and treated    Complication of anesthesia    restless,easily upset   COPD (chronic obstructive pulmonary disease) (HCC)    Depression    Diabetes mellitus without complication (HCC)    Diverticulitis    Fatty liver    GERD (gastroesophageal reflux disease)    Headache(784.0)    migraines   History of kidney stones    Hyperlipidemia    Hypertension    PAD (peripheral artery disease)    Pneumonia    Restless    Stroke [facility])     Past Surgical History:  Procedure Laterality Date   ABDOMINAL HYSTERECTOMY     ACHILLES TENDON LENGTHENING Right 08/18/2017   Procedure: ACHILLES TENDON LENGTHENING;  Surgeon: Celena Sharper, MD;  Location: [facility] OR;  Service: Orthopedics;  Laterality: Right;   AMPUTATION TOE Left 04/02/2023   Procedure: AMPUTATION, TOE;  Surgeon: Malvin Marsa FALCON, DPM;  Location: [facility] ORS;  Service: Orthopedics/Podiatry;  Laterality: Left;  LEFT GREAT TOE   ANTERIOR CERVICAL DECOMP/DISCECTOMY FUSION N/A 03/11/2012   Procedure: ANTERIOR CERVICAL DECOMPRESSION/DISCECTOMY FUSION 1 LEVEL;  Surgeon: Reyes JONETTA Budge, MD;  Location: [facility] NEURO ORS;  Service: Neurosurgery;  Laterality: N/A;  Cervical five-six anterior cervical decompression with  fusion interbody prothesis plating and bonegraft   BACK SURGERY     ENDARTERECTOMY FEMORAL Left 04/01/2023   Procedure: ENDARTERECTOMY, FEMORAL;  Surgeon: Jama Cordella MATSU, MD;  Location: [facility] ORS;  Service: Vascular;  Laterality: Left;  with Popiteal stent placment   EXTERNAL FIXATION LEG Right 08/10/2017   Procedure: EXTERNAL FIXATION ANKLE;  Surgeon: Mardee Lynwood SQUIBB, MD;  Location: [facility] ORS;  Service: Orthopedics;  Laterality: Right;   EXTERNAL FIXATION REMOVAL Right 08/18/2017   Procedure: REMOVAL EXTERNAL FIXATION LEG;  Surgeon: Celena Sharper, MD;  Location: [facility] OR;  Service: Orthopedics;  Laterality: Right;   EYE SURGERY     HERNIA REPAIR     IR FL GUIDED LOC OF NEEDLE/CATH TIP FOR SPINAL INJECTION LT  12/09/2021   IR FL GUIDED LOC OF NEEDLE/CATH TIP FOR SPINAL INJECTION RT  12/09/2021   JOINT REPLACEMENT     bil knees   LOWER EXTREMITY ANGIOGRAPHY Left 03/27/2023   Procedure: Lower Extremity Angiography;  Surgeon: Jama Cordella MATSU, MD;  Location: [facility] INVASIVE CV LAB;  Service: Cardiovascular;  Laterality: Left;   ORIF ANKLE FRACTURE Right 08/18/2017   Procedure: OPEN REDUCTION INTERNAL FIXATION (ORIF) ANKLE FRACTURE;  Surgeon: Celena Sharper, MD;  Location: [facility] OR;  Service: Orthopedics;  Laterality: Right;   REPLACEMENT TOTAL KNEE BILATERAL      Family History: Family History  Problem Relation Age of Onset   Diabetes Mother    Hypertension Mother    Hypertension Father     Social History  reports that she has been smoking cigarettes. She started smoking about 46 years ago. She has a 93.5 pack-year smoking history. She has never used smokeless tobacco. She reports current drug use. Drugs: Marijuana and Oxycodone . She reports that she does not drink alcohol .  Allergies  Allergen Reactions   Tramadol Nausea And Vomiting and Hypertension   Augmentin [Amoxicillin-Pot Clavulanate] Rash    Tolerated Zosyn  03/26/23    Medications   Current Facility-Administered Medications:     0.9 %  sodium chloride  infusion, , Intravenous, Continuous, [name], [facility], Last Rate: 125 mL/hr at 10/23/23 1420, New Bag at 10/23/23 1420   acetaminophen  (TYLENOL ) tablet 650 mg, 650 mg, Oral, Q4H PRN **OR** acetaminophen  (TYLENOL ) suppository 650 mg, 650 mg, Rectal, Q4H PRN, Deanna Delayne GAILS, MD, 650 mg at 10/23/23 9385   acyclovir  (ZOVIRAX ) 715 mg in dextrose  5 % 250 mL IVPB, 10 mg/kg (Adjusted), Intravenous, Q8H, [name], RPH   albuterol  (PROVENTIL ) (2.5 MG/3ML) 0.083% nebulizer solution 2.5 mg, 2.5 mg, Nebulization, Q4H PRN, Deanna Delayne GAILS, MD   ampicillin  (OMNIPEN) 2 g in sodium chloride  0.9 % 100 mL IVPB, 2 g, Intravenous, Q4H, [name], RPH   apixaban WINN) tablet 5 mg, 5 mg, Oral, BID, Deanna Delayne GAILS, MD, 5 mg at 10/23/23 0116   cefTRIAXone  (ROCEPHIN ) 2 g in sodium chloride  0.9 % 100 mL IVPB, 2 g, Intravenous, Q12H, Deanna Delayne GAILS, MD, Stopped at 10/23/23 1206   insulin  aspart (novoLOG ) injection 0-9 Units, 0-9 Units, Subcutaneous, TID WC, Paudel, Keshab, MD   levETIRAcetam (KEPPRA) undiluted injection 1,000 mg, 1,000 mg, Intravenous, Q12H, Paudel, Keshab, MD, 1,000 mg at 10/23/23 1404   LORazepam  (ATIVAN ) injection 1 mg, 1 mg, Intravenous, Q4H PRN, Deanna Delayne GAILS, MD, 1 mg at 10/23/23 0351   LORazepam  (ATIVAN ) injection 2 mg, 2 mg, Intravenous, Q5 Min x 2 PRN, Deanna Delayne GAILS, MD, 2 mg at 10/23/23 0120   ondansetron  (ZOFRAN ) tablet 4 mg, 4 mg, Oral, Q6H PRN **OR** ondansetron  (ZOFRAN ) injection 4 mg, 4 mg, Intravenous, Q6H PRN, Deanna Delayne GAILS, MD   Oral care mouth rinse, 15 mL, Mouth Rinse, Q2H, Deanna Delayne GAILS, MD   Oral care mouth rinse, 15 mL, Mouth Rinse, PRN, Deanna Delayne GAILS, MD   vancomycin  (VANCOCIN ) IVPB 1000 mg/200 mL premix, 1,000 mg, Intravenous, Q8H, Deanna Delayne GAILS, MD, Stopped at 10/23/23 1519  Current Outpatient Medications:    albuterol  (VENTOLIN  HFA) 108 (90 Base) MCG/ACT inhaler, INHALE 2 INHALATIONS BY MOUTH  EVERY 4 HOURS AS  NEEDED FOR  WHEEZING OR FOR SHORTNESS OF  BREATH, Disp: 51 g, Rfl: 1   atorvastatin  (LIPITOR) 40 MG tablet, TAKE 1 TABLET BY MOUTH AT  BEDTIME, Disp: 90 tablet, Rfl: 3   citalopram  (CELEXA ) 40 MG tablet, TAKE 1 TABLET BY MOUTH DAILY, Disp: 100 tablet, Rfl: 1   fluticasone  (FLONASE ) 50 MCG/ACT nasal spray, Place 2 sprays into both nostrils daily. Use for 4-6 weeks then stop and use seasonally or as needed., Disp: 16 g, Rfl: 0   gabapentin  (NEURONTIN ) 600 MG tablet, TAKE 1 TABLET BY MOUTH 4 TIMES  DAILY, Disp: 400 tablet, Rfl: 2   leptospermum manuka honey (MEDIHONEY) gel, Apply 1 Application topically daily., Disp: 44 mL, Rfl: 0   midodrine  (PROAMATINE ) 5 MG tablet, Take 1 tablet (5 mg total) by mouth 3 (three) times daily with meals., Disp: 21 tablet, Rfl: 0   MOUNJARO  10 MG/0.5ML Pen, INJECT THE CONTENTS OF ONE PEN  SUBCUTANEOUSLY WEEKLY AS  DIRECTED, Disp: 6 mL, Rfl: 3   naloxone  (NARCAN ) nasal spray 4 mg/0.1 mL, Place 1 spray into the nose once., Disp: , Rfl:    nicotine  (NICODERM CQ  - DOSED IN MG/24 HOURS) 14 mg/24hr patch, Place onto the skin., Disp: , Rfl:    nystatin  (MYCOSTATIN /NYSTOP ) powder, Apply 1 Application topically 3 (three) times daily., Disp: 15 g, Rfl: 0   ondansetron  (ZOFRAN -ODT) 4 MG disintegrating tablet, DISSOLVE 1 TABLET ON THE TONGUE EVERY 8 HOURS AS NEEDED FOR NAUSEA AND VOMITING, Disp: 30 tablet, Rfl: 2   oxyCODONE -acetaminophen  (PERCOCET) 10-325 MG tablet, Take 1 tablet by mouth every 6 (six) hours as needed for pain., Disp: , Rfl:    pantoprazole  (PROTONIX ) 40 MG tablet, TAKE 1 TABLET BY MOUTH DAILY AT  9 AM, Disp: 100 tablet, Rfl: 1   rizatriptan  (MAXALT -MLT) 10 MG disintegrating tablet, Take 1 tablet (10 mg total) by mouth as needed. May repeat in 2 hours if needed, Disp: 10 tablet, Rfl: 2   TRELEGY ELLIPTA  100-62.5-25 MCG/ACT AEPB, USE 1 INHALATION BY MOUTH ONCE  DAILY AT THE SAME TIME EACH DAY, Disp: 180 each, Rfl: 3   Vitamin D , Ergocalciferol , (DRISDOL ) 1.25 MG  (50000 UNIT) CAPS capsule, TAKE 1 CAPSULE BY MOUTH EVERY WEDNESDAY @9AM , Disp: 4 capsule, Rfl: 11   apixaban (ELIQUIS) 5 MG TABS tablet, Take 1 tablet (5 mg total) by mouth 2 (two) times daily., Disp: 60 tablet, Rfl: 1   APIXABAN (ELIQUIS) VTE STARTER PACK (10MG  AND 5MG ), Take 2 tablets (10 mg) by mouth 2 (two) times daily for 7 days, THEN 1 tablet (5 mg) 2 (two) times daily for 23 days., Disp: 74 tablet, Rfl: 0   methocarbamol  (ROBAXIN ) 500 MG tablet, Take 500 mg by mouth 3 (three) times daily. (Patient not taking: Reported on 10/19/2023), Disp: , Rfl:    mupirocin  ointment (BACTROBAN ) 2 %, Apply 1 Application topically 2 (two) times daily. For 10 days (Patient not taking: Reported on 10/23/2023), Disp: 22 g, Rfl: 0   QUEtiapine  (SEROQUEL ) 100 MG tablet, Take 100 mg by mouth at bedtime. (Patient not taking: Reported on 10/19/2023), Disp: , Rfl:    topiramate  (TOPAMAX ) 100 MG tablet, TAKE 1 TABLET BY MOUTH TWICE  DAILY (Patient not taking: Reported on 10/19/2023), Disp: 200 tablet, Rfl: 2  Vitals   Vitals:   10/23/23 0759 10/23/23 0930 10/23/23 1330 10/23/23 1359  BP:  127/78 138/75   Pulse:  98 97   Resp:  20 (!) 21   Temp: 100.3 F (37.9 C)   97.8 F (36.6 C)  TempSrc: Axillary   Oral  SpO2:  98% 98%   Weight:      Height:        Body mass index is 32.02 kg/m.   Physical Exam   Constitutional: Appears well-developed and well-nourished.  Neurologic Examination    Neuro: Mental Status: Patient is awake, alert, oriented to person, place, year, but is unable to give the month, answers June or July.  She gives me answer four when I ask her how many quarters are in $2.75 and she is unable to spell world backwards No signs of aphasia or neglect Cranial Nerves: II: right upper quadranatnopia. Pupils are equal, round, and reactive to light.   III,IV, VI: EOMI without ptosis or diploplia.  V: Facial sensation is symmetric to temperature VII: Facial movement is symmetric.  VIII:  hearing is intact to voice X: Uvula elevates symmetrically XII: tongue is midline without atrophy or fasciculations.  Motor: Tone  is normal. Bulk is normal. 5/5 strength was present in all four extremities.  Sensory: Sensation is symmetric to light touch and temperature in the arms and legs. Cerebellar: FNF are intact bilaterally        Labs/Imaging/Neurodiagnostic studies   CBC:  Recent Labs  Lab 11/20/23 2235 10/23/23 0419  WBC 10.8* 13.7*  NEUTROABS 8.6* 9.6*  HGB 14.0 13.2  HCT 42.7 41.0  MCV 85.6 85.8  PLT 340 377   Basic Metabolic Panel:  Lab Results  Component Value Date   NA 136 10/23/2023   K 3.7 10/23/2023   CO2 21 (L) 10/23/2023   GLUCOSE 127 (H) 10/23/2023   BUN 15 10/23/2023   CREATININE 0.66 10/23/2023   CALCIUM  9.0 10/23/2023   GFRNONAA >60 10/23/2023   GFRAA >60 08/18/2017   Lipid Panel:  Lab Results  Component Value Date   LDLCALC 71 06/03/2023   HgbA1c:  Lab Results  Component Value Date   HGBA1C 5.9 (H) 06/02/2023   Urine Drug Screen:     Component Value Date/Time   LABOPIA POSITIVE (A) 10/23/2023 0012   LABOPIA POSITIVE (A) 08/18/2017 0703   COCAINSCRNUR NONE DETECTED 10/23/2023 0012   LABBENZ NONE DETECTED 10/23/2023 0012   LABBENZ POSITIVE (A) 08/18/2017 0703   AMPHETMU NONE DETECTED 10/23/2023 0012   AMPHETMU NONE DETECTED 08/18/2017 0703   THCU POSITIVE (A) 10/23/2023 0012   THCU POSITIVE (A) 08/18/2017 0703   LABBARB NONE DETECTED 10/23/2023 0012   LABBARB NONE DETECTED 08/18/2017 0703    Alcohol  Level     Component Value Date/Time   ETH <15 06/02/2023 1255   INR  Lab Results  Component Value Date   INR 1.2 10/23/2023   APTT  Lab Results  Component Value Date   APTT 31 10/23/2023    CT Head without contrast(Personally reviewed): She has an old cortical stroke on the left which could be a seizure focus  ASSESSMENT   TYNLEE BAYLE is a 58 y.o. female with a history of previous stroke who presents with  headaches, low-grade fever, and confusion with a new onset seizure.  I agree with proceeding with lumbar puncture which is ordered under fluoroscopy.  I will also continue Keppra.  It is possible that what ever is causing her fevers causing a lowered seizure threshold and a cortical stroke served as a seizure focus, so meningitis is not a deninite. She will also need EEG as well.   RECOMMENDATIONS  EEG LP for cells, glucose, protein, culture, meningitis panel Fever workup per IM Continue keppra 1g bid MRI brain ______________________________________________________________________    Signed, Aisha Seals, MD [facility]

## 2023-10-23 NOTE — Sepsis Progress Note (Signed)
 Elink monitoring for the code sepsis protocol.

## 2023-10-23 NOTE — ED Notes (Signed)
 Pt cleaned of urine. Bed linen changed. Pt A&Ox4. Speaking in full sentences

## 2023-10-23 NOTE — ED Notes (Signed)
 Called IR number, no answer, left voicemail. Holding 1mg  of ativan  until we are sure Pt is getting on the schedule again for LP. MD notified via secure chat

## 2023-10-23 NOTE — H&P (Addendum)
 History and Physical    [name] FMW:[id number] DOB: 1965-03-01 DOA: 10/22/2023  DOS: the patient was seen and examined on 10/22/2023  PCP: Edman Marsa PARAS, DO   Patient coming from: Home  I have personally briefly reviewed patient's old medical records in [facility] Link  Chief Complaint: Seizure at home  HPI: [name] is a pleasant 58 y.o. female with medical history significant for recurrent stroke with chronic residual right-sided weakness, HTN, HLD, PVD, DM, who presented to ED at [facility] for an episode of seizure.  Patient was recently admitted and discharged from [facility] between 10/19/2023 and 10/20/2023 after being treated for a splenic infarct.  Patient was discharged home on Eliquis and held Plavix  when patient was on Eliquis. Per EMS, husband at home witnessed seizure lasting approximately 1 minute, postictal confusion, no history of seizure in the past.  Patient has a history of stroke with right-sided deficits.  Recently admitted to the hospital for a splenic lesion.  No evidence of injury to tongue or incontinence.  Patient oriented to self but not to place or situation.  EMS vitals blood glucose 159, 137/75, 98.5 F axillary.   Patient was sedated with Ativan  in the night.  She was not able to provide single history with me.  I did speak to patient's husband who said patient was eating dinner with husband and suddenly she had a seizure-like activities as mentioned above.  Denies any fever, chills, nausea, vomiting, dysuria, abdominal pain prior to that.  No history of trauma to head.  ED Course: Upon arrival to the ED, patient is found to be confused, tachycardic at 102, febrile at 100.3 F, white count 10.8, EKG sinus tach he at 103, CT head no acute pathology patient had low blood pressure 1 incident.  He received midodrine  5 mg Keppra 1500 milligram and ondansetron  4 mg.  Urinalysis without infection.  COVID flu RSV negative.  Hospitalist service was consulted for  evaluation for admission for possible seizure versus sepsis.  Review of Systems:  ROS  All other systems negative except as noted in the HPI.  Past Medical History:  Diagnosis Date   Anxiety    Cancer (HCC)    a spot on liver and treated    Complication of anesthesia    restless,easily upset   COPD (chronic obstructive pulmonary disease) (HCC)    Depression    Diabetes mellitus without complication (HCC)    Diverticulitis    Fatty liver    GERD (gastroesophageal reflux disease)    Headache(784.0)    migraines   History of kidney stones    Hyperlipidemia    Hypertension    PAD (peripheral artery disease)    Pneumonia    Restless    Stroke [facility])     Past Surgical History:  Procedure Laterality Date   ABDOMINAL HYSTERECTOMY     ACHILLES TENDON LENGTHENING Right 08/18/2017   Procedure: ACHILLES TENDON LENGTHENING;  Surgeon: Celena Sharper, MD;  Location: [facility] OR;  Service: Orthopedics;  Laterality: Right;   AMPUTATION TOE Left 04/02/2023   Procedure: AMPUTATION, TOE;  Surgeon: Malvin Marsa FALCON, DPM;  Location: [facility] ORS;  Service: Orthopedics/Podiatry;  Laterality: Left;  LEFT GREAT TOE   ANTERIOR CERVICAL DECOMP/DISCECTOMY FUSION N/A 03/11/2012   Procedure: ANTERIOR CERVICAL DECOMPRESSION/DISCECTOMY FUSION 1 LEVEL;  Surgeon: Reyes JONETTA Budge, MD;  Location: [facility] NEURO ORS;  Service: Neurosurgery;  Laterality: N/A;  Cervical five-six anterior cervical decompression with fusion interbody prothesis plating and bonegraft   BACK  SURGERY     ENDARTERECTOMY FEMORAL Left 04/01/2023   Procedure: ENDARTERECTOMY, FEMORAL;  Surgeon: Jama Cordella MATSU, MD;  Location: [facility] ORS;  Service: Vascular;  Laterality: Left;  with Popiteal stent placment   EXTERNAL FIXATION LEG Right 08/10/2017   Procedure: EXTERNAL FIXATION ANKLE;  Surgeon: Mardee Lynwood SQUIBB, MD;  Location: [facility] ORS;  Service: Orthopedics;  Laterality: Right;   EXTERNAL FIXATION REMOVAL Right 08/18/2017   Procedure: REMOVAL EXTERNAL  FIXATION LEG;  Surgeon: Celena Sharper, MD;  Location: [facility] OR;  Service: Orthopedics;  Laterality: Right;   EYE SURGERY     HERNIA REPAIR     IR FL GUIDED LOC OF NEEDLE/CATH TIP FOR SPINAL INJECTION LT  12/09/2021   IR FL GUIDED LOC OF NEEDLE/CATH TIP FOR SPINAL INJECTION RT  12/09/2021   JOINT REPLACEMENT     bil knees   LOWER EXTREMITY ANGIOGRAPHY Left 03/27/2023   Procedure: Lower Extremity Angiography;  Surgeon: Jama Cordella MATSU, MD;  Location: [facility] INVASIVE CV LAB;  Service: Cardiovascular;  Laterality: Left;   ORIF ANKLE FRACTURE Right 08/18/2017   Procedure: OPEN REDUCTION INTERNAL FIXATION (ORIF) ANKLE FRACTURE;  Surgeon: Celena Sharper, MD;  Location: [facility] OR;  Service: Orthopedics;  Laterality: Right;   REPLACEMENT TOTAL KNEE BILATERAL       reports that she has been smoking cigarettes. She started smoking about 46 years ago. She has a 93.5 pack-year smoking history. She has never used smokeless tobacco. She reports current drug use. Drugs: Marijuana and Oxycodone . She reports that she does not drink alcohol .  Allergies  Allergen Reactions   Tramadol Nausea And Vomiting and Hypertension   Augmentin [Amoxicillin-Pot Clavulanate] Rash    Tolerated Zosyn  03/26/23    Family History  Problem Relation Age of Onset   Diabetes Mother    Hypertension Mother    Hypertension Father     Prior to Admission medications   Medication Sig Start Date End Date Taking? Authorizing Provider  albuterol  (VENTOLIN  HFA) 108 (90 Base) MCG/ACT inhaler INHALE 2 INHALATIONS BY MOUTH  EVERY 4 HOURS AS NEEDED FOR  WHEEZING OR FOR SHORTNESS OF  BREATH 08/28/23   Karamalegos, Marsa PARAS, DO  apixaban (ELIQUIS) 5 MG TABS tablet Take 1 tablet (5 mg total) by mouth 2 (two) times daily. 10/20/23   Amin, Sumayya, MD  APIXABAN WINN) VTE STARTER PACK (10MG  AND 5MG ) Take 2 tablets (10 mg) by mouth 2 (two) times daily for 7 days, THEN 1 tablet (5 mg) 2 (two) times daily for 23 days. 10/20/23 11/19/23  Amin, Sumayya,  MD  atorvastatin  (LIPITOR) 40 MG tablet TAKE 1 TABLET BY MOUTH AT  BEDTIME 01/08/23   Karamalegos, Marsa PARAS, DO  citalopram  (CELEXA ) 40 MG tablet TAKE 1 TABLET BY MOUTH DAILY 08/28/23   Edman, Marsa PARAS, DO  fluticasone  (FLONASE ) 50 MCG/ACT nasal spray Place 2 sprays into both nostrils daily. Use for 4-6 weeks then stop and use seasonally or as needed. 03/02/23   Karamalegos, Marsa PARAS, DO  gabapentin  (NEURONTIN ) 600 MG tablet TAKE 1 TABLET BY MOUTH 4 TIMES  DAILY 06/10/23   Edman, Marsa PARAS, DO  leptospermum manuka honey (MEDIHONEY) gel Apply 1 Application topically daily. 04/28/23   Brown, Fallon E, NP  methocarbamol  (ROBAXIN ) 500 MG tablet Take 500 mg by mouth 3 (three) times daily. Patient not taking: Reported on 10/19/2023 04/07/23   [provider]  midodrine  (PROAMATINE ) 5 MG tablet Take 1 tablet (5 mg total) by mouth 3 (three) times daily with meals. 10/20/22  Darliss Rogue, MD  MOUNJARO  10 MG/0.5ML Pen INJECT THE CONTENTS OF ONE PEN  SUBCUTANEOUSLY WEEKLY AS  DIRECTED 08/11/23   Edman Alexander J, DO  mupirocin  ointment (BACTROBAN ) 2 % Apply 1 Application topically 2 (two) times daily. For 10 days 03/09/23   Edman Marsa PARAS, DO  naloxone  (NARCAN ) nasal spray 4 mg/0.1 mL Place 1 spray into the nose once. 10/05/23   [provider]  nicotine  (NICODERM CQ  - DOSED IN MG/24 HOURS) 14 mg/24hr patch Place onto the skin. 03/16/23   [provider]  nystatin  (MYCOSTATIN /NYSTOP ) powder Apply 1 Application topically 3 (three) times daily. 08/19/23   Moishe Chiquita HERO, NP  ondansetron  (ZOFRAN -ODT) 4 MG disintegrating tablet DISSOLVE 1 TABLET ON THE TONGUE EVERY 8 HOURS AS NEEDED FOR NAUSEA AND VOMITING 04/10/23   Edman, Marsa PARAS, DO  oxyCODONE -acetaminophen  (PERCOCET) 10-325 MG tablet Take 1 tablet by mouth every 6 (six) hours as needed for pain.    [provider]  pantoprazole  (PROTONIX ) 40 MG tablet TAKE 1 TABLET BY MOUTH DAILY AT   9 AM 06/29/23   Karamalegos, Marsa PARAS, DO  QUEtiapine  (SEROQUEL ) 100 MG tablet Take 100 mg by mouth at bedtime. Patient not taking: Reported on 10/19/2023 09/28/23   [provider]  rizatriptan  (MAXALT -MLT) 10 MG disintegrating tablet Take 1 tablet (10 mg total) by mouth as needed. May repeat in 2 hours if needed 06/23/23   Edman Marsa PARAS, DO  topiramate  (TOPAMAX ) 100 MG tablet TAKE 1 TABLET BY MOUTH TWICE  DAILY Patient not taking: Reported on 10/19/2023 10/07/22   Edman Marsa PARAS, DO  TRELEGY ELLIPTA  100-62.5-25 MCG/ACT AEPB USE 1 INHALATION BY MOUTH ONCE  DAILY AT THE SAME TIME EACH DAY 02/17/23   Karamalegos, Marsa PARAS, DO  Vitamin D , Ergocalciferol , (DRISDOL ) 1.25 MG (50000 UNIT) CAPS capsule TAKE 1 CAPSULE BY MOUTH EVERY WEDNESDAY @9AM  06/27/22   Edman Marsa PARAS, DO    Physical Exam: Vitals:   10/23/23 0700 10/23/23 0715 10/23/23 0759 10/23/23 0930  BP: 130/86   127/78  Pulse:  (!) 107  98  Resp:  17  20  Temp:   100.3 F (37.9 C)   TempSrc:   Axillary   SpO2:  100%  98%  Weight:      Height:        Physical Exam   Constitutional: Sleepy/confused not following commands HEENT: Neck supple Respiratory: Clear to auscultation B/L, no wheezing, no rales.  Cardiovascular: Regular rate and rhythm, no murmurs / rubs / gallops. No extremity edema. 2+ pedal pulses. No carotid bruits.  Abdomen: Soft, no tenderness, Bowel sounds positive.  Musculoskeletal: no clubbing / cyanosis. Good ROM, no contractures. Normal muscle tone.  Skin: no rashes, lesions, ulcers. Neurologic: She is sleepy/confused, meaningful neurological exam not able to be performed  Labs on Admission: I have personally reviewed following labs and imaging studies  CBC: Recent Labs  Lab 10/19/23 0318 10/20/23 0341 10/22/23 2235 10/23/23 0419  WBC 11.4* 8.9 10.8* 13.7*  NEUTROABS 8.8*  --  8.6* 9.6*  HGB 13.7 12.4 14.0 13.2  HCT 42.0 39.0 42.7 41.0  MCV 85.9 86.7 85.6 85.8   PLT 354 327 340 377   Basic Metabolic Panel: Recent Labs  Lab 10/19/23 0318 10/20/23 0341 10/22/23 2235 10/23/23 0419  NA 136 133* 136 136  K 4.1 3.6 3.9 3.7  CL 98 100 101 101  CO2 23 25 20* 21*  GLUCOSE 156* 103* 185* 127*  BUN 12 15 10 15   CREATININE  0.55 0.78 0.72 0.66  CALCIUM  9.3 8.5* 9.2 9.0  MG 1.8  --   --   --    GFR: Estimated Creatinine Clearance: 84.5 mL/min (by C-G formula based on SCr of 0.66 mg/dL). Liver Function Tests: Recent Labs  Lab 10/19/23 0318 10/22/23 2235 10/23/23 0419  AST 19 21 16   ALT 11 11 10   ALKPHOS 64 60 57  BILITOT 0.5 0.6 0.6  PROT 7.4 7.3 7.3  ALBUMIN 3.5 3.7 3.6   No results for input(s): LIPASE, AMYLASE in the last 168 hours. No results for input(s): AMMONIA in the last 168 hours. Coagulation Profile: Recent Labs  Lab 10/19/23 1021 10/23/23 0419  INR 1.0 1.2   Cardiac Enzymes: Recent Labs  Lab 10/19/23 0318 10/19/23 0501  TROPONINIHS 4 4   BNP (last 3 results) No results for input(s): BNP in the last 8760 hours. HbA1C: No results for input(s): HGBA1C in the last 72 hours. CBG: No results for input(s): GLUCAP in the last 168 hours. Lipid Profile: No results for input(s): CHOL, HDL, LDLCALC, TRIG, CHOLHDL, LDLDIRECT in the last 72 hours. Thyroid  Function Tests: No results for input(s): TSH, T4TOTAL, FREET4, T3FREE, THYROIDAB in the last 72 hours. Anemia Panel: No results for input(s): VITAMINB12, FOLATE, FERRITIN, TIBC, IRON, RETICCTPCT in the last 72 hours. Urine analysis:    Component Value Date/Time   COLORURINE YELLOW (A) 10/23/2023 0012   APPEARANCEUR CLEAR (A) 10/23/2023 0012   APPEARANCEUR Clear 09/08/2022 1446   LABSPEC 1.032 (H) 10/23/2023 0012   LABSPEC 1.030 09/23/2013 0154   PHURINE 5.0 10/23/2023 0012   GLUCOSEU NEGATIVE 10/23/2023 0012   GLUCOSEU >=500 09/23/2013 0154   HGBUR NEGATIVE 10/23/2023 0012   BILIRUBINUR NEGATIVE 10/23/2023 0012    BILIRUBINUR Negative 09/08/2022 1446   BILIRUBINUR Negative 09/23/2013 0154   KETONESUR 5 (A) 10/23/2023 0012   PROTEINUR 100 (A) 10/23/2023 0012   UROBILINOGEN 0.2 04/21/2022 1445   UROBILINOGEN 0.2 03/11/2012 1234   NITRITE NEGATIVE 10/23/2023 0012   LEUKOCYTESUR NEGATIVE 10/23/2023 0012   LEUKOCYTESUR Negative 09/23/2013 0154    Radiological Exams on Admission: I have personally reviewed images DG Chest Port 1 View Result Date: 10/23/2023 EXAM: 1 VIEW XRAY OF THE CHEST 10/23/2023 04:19:00 AM COMPARISON: Portable chest 10/19/2023 and earlier. CLINICAL HISTORY: 58 year old female. Sepsis. Hx of COPD, diabetes, HTN, and current smoker. FINDINGS: LUNGS AND PLEURA: Mildly lower lung volumes. No pulmonary edema. No pleural effusion. No pneumothorax. When allowing for portable technique, the lungs are clear. HEART AND MEDIASTINUM: Mediastinal contours remain normal. No acute abnormality of the cardiac silhouette. BONES AND SOFT TISSUES: Chronic cervical ACDF. No acute osseous abnormality. IMPRESSION: 1. Lower lung volumes, no acute cardiopulmonary abnormality. Electronically signed by: Helayne Hurst MD 10/23/2023 04:35 AM EDT RP Workstation: [id number]   CT Head Wo Contrast Result Date: 10/22/2023 CLINICAL DATA:  Fall, seizure on Xarelto; fall pain EXAM: CT HEAD WITHOUT CONTRAST CT CERVICAL SPINE WITHOUT CONTRAST TECHNIQUE: Multidetector CT imaging of the head and cervical spine was performed following the standard protocol without intravenous contrast. Multiplanar CT image reconstructions of the cervical spine were also generated. RADIATION DOSE REDUCTION: This exam was performed according to the departmental dose-optimization program which includes automated exposure control, adjustment of the mA and/or kV according to patient size and/or use of iterative reconstruction technique. COMPARISON:  CT head 06/02/2023 FINDINGS: CT HEAD FINDINGS Brain: Patchy and confluent areas of decreased attenuation are  noted throughout the deep and periventricular white matter of the cerebral hemispheres bilaterally, compatible  with chronic microvascular ischemic disease. Left basal ganglia, temporal, occipital encephalomalacia. No evidence of large-territorial acute infarction. No parenchymal hemorrhage. No mass lesion. No extra-axial collection. No mass effect or midline shift. No hydrocephalus. Basilar cisterns are patent. Vascular: No hyperdense vessel. Atherosclerotic calcifications are present within the cavernous internal carotid arteries. Skull: No acute fracture or focal lesion. Sinuses/Orbits: Paranasal sinuses and mastoid air cells are clear. The orbits are unremarkable. Other: None. CT CERVICAL SPINE FINDINGS Alignment: Normal. Skull base and vertebrae: C5-C6 anterior cervical discectomy and fusion surgical hardware. No acute fracture. No aggressive appearing focal osseous lesion or focal pathologic process. Soft tissues and spinal canal: No prevertebral fluid or swelling. No visible canal hematoma. Upper chest: Unremarkable. Other: None. IMPRESSION: 1. No acute intracranial abnormality. 2. No acute displaced fracture or traumatic listhesis of the cervical spine. Electronically Signed   By: Morgane  Naveau M.D.   On: 10/22/2023 22:44   CT Cervical Spine Wo Contrast Result Date: 10/22/2023 CLINICAL DATA:  Fall, seizure on Xarelto; fall pain EXAM: CT HEAD WITHOUT CONTRAST CT CERVICAL SPINE WITHOUT CONTRAST TECHNIQUE: Multidetector CT imaging of the head and cervical spine was performed following the standard protocol without intravenous contrast. Multiplanar CT image reconstructions of the cervical spine were also generated. RADIATION DOSE REDUCTION: This exam was performed according to the departmental dose-optimization program which includes automated exposure control, adjustment of the mA and/or kV according to patient size and/or use of iterative reconstruction technique. COMPARISON:  CT head 06/02/2023 FINDINGS:  CT HEAD FINDINGS Brain: Patchy and confluent areas of decreased attenuation are noted throughout the deep and periventricular white matter of the cerebral hemispheres bilaterally, compatible with chronic microvascular ischemic disease. Left basal ganglia, temporal, occipital encephalomalacia. No evidence of large-territorial acute infarction. No parenchymal hemorrhage. No mass lesion. No extra-axial collection. No mass effect or midline shift. No hydrocephalus. Basilar cisterns are patent. Vascular: No hyperdense vessel. Atherosclerotic calcifications are present within the cavernous internal carotid arteries. Skull: No acute fracture or focal lesion. Sinuses/Orbits: Paranasal sinuses and mastoid air cells are clear. The orbits are unremarkable. Other: None. CT CERVICAL SPINE FINDINGS Alignment: Normal. Skull base and vertebrae: C5-C6 anterior cervical discectomy and fusion surgical hardware. No acute fracture. No aggressive appearing focal osseous lesion or focal pathologic process. Soft tissues and spinal canal: No prevertebral fluid or swelling. No visible canal hematoma. Upper chest: Unremarkable. Other: None. IMPRESSION: 1. No acute intracranial abnormality. 2. No acute displaced fracture or traumatic listhesis of the cervical spine. Electronically Signed   By: Morgane  Naveau M.D.   On: 10/22/2023 22:44    EKG: My personal interpretation of EKG shows: Sinus tachycardia at 103 beats per minute, no ST elevations    Assessment/Plan Principal Problem:   Seizure (HCC) Active Problems:   PVD (peripheral vascular disease)   Type 2 diabetes mellitus with peripheral vascular disease (HCC)   History of cerebrovascular accident (CVA) with residual deficit   GERD (gastroesophageal reflux disease)   Splenic infarction    Assessment and Plan: 58 year old female history of vascular dementia, recent splenic infarction on Eliquis, DM, HTN, peripheral vascular disease, history of stroke who was brought in  from home for an episode of seizure and temperature of 100.3.  1.  Acute change in mental status/seizure/fever/?? Meningitis - She will be admitted to hospital as inpatient - Not sure whether this is a part of infection or new onset of seizure or metabolic encephalopathy - Her CT scan of head and neck have been negative for acute pathology. - She  was started on Keppra 1500 mg one-time, no further seizures in the ED - She received 1 dose of Ativan  in the emergency room - She also received ceftriaxone  2 g for possible meningitis along with dose of vancomycin . - I will continue antibiotics ceftriaxone  and vancomycin  until we have a lumbar puncture done and result is available. - Continue Keppra for possible seizure 1000 mg twice daily IV. - CSF will be sent for cell count, glucose, Gram stain, cultures - Neurology evaluation, seizure precaution, fall precaution - May consider calling ID consult if CSF results were to come back positive  2.  History of splenic infarct - Continue Eliquis  3.  History of stroke with right-sided residual weakness - Continue supportive care  4.  HTN/HLD/PVD - Resume her home medications - Continue to monitor blood pressure  5.  Diabetes - She will be given insulin  sliding scale    DVT prophylaxis: Eliquis Code Status: DNR/DNI(Do NOT Intubate) Family Communication: Husband over the phone Disposition Plan: Home versus rehab, husband asked me if we can send her to rehab he cannot take care of her at home Consults called: Neurology, IR for LP Admission status: Inpatient, Telemetry bed   Nena Rebel, MD [facility] 10/23/2023, 1:11 PM

## 2023-10-23 NOTE — ED Notes (Signed)
 Pt OTF to IR for LP @ this time

## 2023-10-23 NOTE — Consult Note (Addendum)
 Pharmacy Antibiotic Note  ASSESSMENT: 58 y.o. female with PMH including multiple CVAs, HTN, HLD, PAD is presenting with concerns for herpes encephalitis. Lumbar puncture pending. Pharmacy has been consulted to manage intravenous acyclovir  and vancomycin  dosing. Patient also on ceftriaxone  q12H and ampicillin .  PLAN: Initiate acyclovir  715mg  (10 mg/kg using adjBW) IV q8H Increase rate of normal saline infusion to 125 mL/hr for kidney protection Continue vancomycin  1000mg  IV q8H as directed per nomogram dosing Follow up LP and CSF culture results Monitor renal function to assess for any necessary dosing changes.  Patient measurements: Height: 5' 6 (167.6 cm) Weight: 90 kg (198 lb 6.6 oz) IBW/kg (Calculated) : 59.3  Vital signs: Temp: 100.3 F (37.9 C) (10/10 0759) Temp Source: Axillary (10/10 0759) BP: 138/75 (10/10 1330) Pulse Rate: 97 (10/10 1330) Recent Labs  Lab 10/20/23 0341 10/22/23 2235 10/23/23 0419  WBC 8.9 10.8* 13.7*  CREATININE 0.78 0.72 0.66   Estimated Creatinine Clearance: 84.5 mL/min (by C-G formula based on SCr of 0.66 mg/dL).  Allergies: Allergies  Allergen Reactions   Tramadol Nausea And Vomiting and Hypertension   Augmentin [Amoxicillin-Pot Clavulanate] Rash    Tolerated Zosyn  03/26/23    Antimicrobials this admission: Ceftriaxone  10/10 >> Vancomycin  10/10 >> Ampicillin  10/10 >> Acyclovir  10/10 >>  Dose adjustments this admission: n/a  Microbiology results: LP pending  Thank you for allowing pharmacy to be a part of this patient's care.  Will M. Lenon, PharmD, BCPS Clinical Pharmacist 10/23/2023 1:50 PM

## 2023-10-23 NOTE — ED Notes (Signed)
 Pt is awake and AxOX3 at this time. Disoriented to time. This is new change from all shift, MD notified via secure chat.  Pts brief and bedding were wet. Bedding and brief changed, Pt updated on care.

## 2023-10-23 NOTE — Progress Notes (Signed)
 PHARMACY NOTE:  ANTIMICROBIAL RENAL DOSAGE ADJUSTMENT  Current antimicrobial regimen includes a mismatch between antimicrobial dosage and estimated renal function.  As per policy approved by the Pharmacy & Therapeutics and Medical Executive Committees, the antimicrobial dosage will be adjusted accordingly.  Current antimicrobial dosage:  Ampicillin  1g IV q6H  Indication: Meningitis  Renal Function: Estimated Creatinine Clearance: 84.5 mL/min (by C-G formula based on SCr of 0.66 mg/dL).  Antimicrobial dosage has been changed to:  Ampicillin  2g IV q4H   Thank you for allowing pharmacy to be a part of this patient's care.  Elsie CHRISTELLA Piety, [facility] 10/23/2023 2:15 PM

## 2023-10-23 NOTE — ED Notes (Signed)
 Pt OTF to get LP @ this time

## 2023-10-23 NOTE — ED Notes (Signed)
 Pt back from LP. Drowsy at this time.

## 2023-10-23 NOTE — Procedures (Signed)
 History: 58 yo F being evaluated for seizures  EEG Duration: 22 minutes  Sedation: none  Patient State: Awake and asleep  Technique: This EEG was acquired with electrodes placed according to the International 10-20 electrode system (including Fp1, Fp2, F3, F4, C3, C4, P3, P4, O1, O2, T3, T4, T5, T6, A1, A2, Fz, Cz, Pz). The following electrodes were missing or displaced: none.   Background: There is a posterior dominant rhythm of 8.5 Hz thatis intermixed with prominent irregular generalized delta and theta range activity. There is no clear epileptiform discharges seen. Sleep is briefly recorded with normal appearing structures.   Photic stimulation: Physiologic driving is not performed  EEG Abnormalities: 1) generalized irregular slow activity.   Clinical Interpretation: This eeg is consistent with a generalized non-specific cerebral dysfunction(encephalopathy). There was no seizure or seizure predisposition recorded on this study. Please note that lack of epileptiform activity on EEG does not preclude the possibility of epilepsy.   Deanna Seals, MD [facility]   If 7pm- 7am, please page neurology on call as listed in AMION.

## 2023-10-23 NOTE — Telephone Encounter (Signed)
 OK for verbal orders as requested.

## 2023-10-23 NOTE — ED Notes (Addendum)
 Spoke to pt's husband [name] on the phone and gave him an update on pt.

## 2023-10-23 NOTE — Progress Notes (Signed)
 Pharmacy Antibiotic Note  [name] is a 58 y.o. female admitted on 10/22/2023 with meningitis.  Pharmacy has been consulted for Vancomycin  dosing.  Plan: Vancomycin  2 gm IV X 1 ordered to start on 10/10 @ ~ 0600. Vancomycin  1 gm IV Q8H ordered to start on 10/10 @ ~ 1400.   - Will use vancomycin  nomogram to dose instead of AUC method  Goal trough : 15 - 20 mcg/mL   Height: 5' 6 (167.6 cm) Weight: 90 kg (198 lb 6.6 oz) IBW/kg (Calculated) : 59.3  Temp (24hrs), Avg:99.6 F (37.6 C), Min:98.1 F (36.7 C), Max:100.3 F (37.9 C)  Recent Labs  Lab 10/19/23 0318 10/20/23 0341 10/22/23 2235 10/23/23 0419  WBC 11.4* 8.9 10.8* 13.7*  CREATININE 0.55 0.78 0.72 0.66  LATICACIDVEN  --   --   --  2.4*    Estimated Creatinine Clearance: 84.5 mL/min (by C-G formula based on SCr of 0.66 mg/dL).    Allergies  Allergen Reactions   Tramadol Nausea And Vomiting and Hypertension   Augmentin [Amoxicillin-Pot Clavulanate] Rash    Tolerated Zosyn  03/26/23    Antimicrobials this admission:   >>    >>   Dose adjustments this admission:   Microbiology results:  BCx:   UCx:    Sputum:    MRSA PCR:   Thank you for allowing pharmacy to be a part of this patient's care.  Maxey Ransom D 10/23/2023 5:56 AM

## 2023-10-23 NOTE — ED Notes (Addendum)
 Pt back from IR. Tech informed this RN that they were unable to complete LP d/t pt being too anxious and not being able to lay still. MD made aware.

## 2023-10-23 NOTE — Telephone Encounter (Signed)
 LM to call back. Okay for verbal orders. No secure VM.

## 2023-10-23 NOTE — ED Notes (Signed)
 Spoke with son at this time and gave updates

## 2023-10-23 NOTE — Transitions of Care (Post Inpatient/ED Visit) (Signed)
   10/23/2023  Name: [name] MRN: [id number] DOB: [birth year]  Today's TOC FU Call Status: Today's TOC FU Call Status:: Unsuccessful Call (3rd Attempt) Unsuccessful Call (1st Attempt) Date: 10/21/23 Unsuccessful Call (2nd Attempt) Date: 10/22/23 Unsuccessful Call (3rd Attempt) Date: 10/23/23  Attempted to reach the patient regarding the most recent Inpatient/ED visit.  Follow Up Plan: No further outreach attempts will be made at this time. We have been unable to contact the patient.  Signature Julian Lemmings, LPN [facility] Nurse Health Advisor Direct Dial [phone]

## 2023-10-23 NOTE — Progress Notes (Signed)
 Eeg done

## 2023-10-24 ENCOUNTER — Inpatient Hospital Stay: Admit: 2023-10-24

## 2023-10-24 ENCOUNTER — Inpatient Hospital Stay
Admit: 2023-10-24 | Discharge: 2023-10-24 | Disposition: A | Attending: Obstetrics and Gynecology | Admitting: Obstetrics and Gynecology

## 2023-10-24 ENCOUNTER — Inpatient Hospital Stay

## 2023-10-24 DIAGNOSIS — D735 Infarction of spleen: Secondary | ICD-10-CM | POA: Diagnosis not present

## 2023-10-24 DIAGNOSIS — R509 Fever, unspecified: Secondary | ICD-10-CM | POA: Diagnosis not present

## 2023-10-24 DIAGNOSIS — R569 Unspecified convulsions: Secondary | ICD-10-CM | POA: Diagnosis not present

## 2023-10-24 DIAGNOSIS — R918 Other nonspecific abnormal finding of lung field: Secondary | ICD-10-CM | POA: Diagnosis not present

## 2023-10-24 DIAGNOSIS — D7389 Other diseases of spleen: Secondary | ICD-10-CM | POA: Diagnosis not present

## 2023-10-24 LAB — GLUCOSE, CAPILLARY
Glucose-Capillary: 128 mg/dL — ABNORMAL HIGH (ref 70–99)
Glucose-Capillary: 174 mg/dL — ABNORMAL HIGH (ref 70–99)

## 2023-10-24 LAB — ECHOCARDIOGRAM COMPLETE
AR max vel: 1.41 cm2
AV Peak grad: 14.6 mmHg
Ao pk vel: 1.91 m/s
Area-P 1/2: 3.43 cm2
Height: 66 in
S' Lateral: 3 cm
Weight: 3174.62 [oz_av]

## 2023-10-24 LAB — PROTIME-INR
INR: 1.1 (ref 0.8–1.2)
Prothrombin Time: 15.2 s (ref 11.4–15.2)

## 2023-10-24 LAB — CREATININE, SERUM
Creatinine, Ser: 0.62 mg/dL (ref 0.44–1.00)
GFR, Estimated: >60 mL/min (ref >60)

## 2023-10-24 LAB — RESPIRATORY PANEL BY PCR

## 2023-10-24 LAB — CBC
HCT: 35.8 % — ABNORMAL LOW (ref 36.0–46.0)
Hemoglobin: 11.7 g/dL — ABNORMAL LOW (ref 12.0–15.0)
MCH: 28.3 pg (ref 26.0–34.0)
MCHC: 32.7 g/dL (ref 30.0–36.0)
MCV: 86.7 fL (ref 80.0–100.0)
Platelets: 286 K/uL (ref 150–400)
RBC: 4.13 MIL/uL (ref 3.87–5.11)
RDW: 14.6 % (ref 11.5–15.5)
WBC: 9.8 K/uL (ref 4.0–10.5)
nRBC: 0 % (ref 0.0–0.2)

## 2023-10-24 LAB — MENINGITIS/ENCEPHALITIS PANEL (CSF)

## 2023-10-24 LAB — MAGNESIUM: Magnesium: 1.7 mg/dL (ref 1.7–2.4)

## 2023-10-24 LAB — APTT: aPTT: 106 s — ABNORMAL HIGH (ref 24–36)

## 2023-10-24 LAB — CRYPTOCOCCAL ANTIGEN, CSF: Crypto Ag: NEGATIVE

## 2023-10-24 LAB — CBG MONITORING, ED
Glucose-Capillary: 97 mg/dL (ref 70–99)
Glucose-Capillary: 97 mg/dL (ref 70–99)

## 2023-10-24 LAB — HEPARIN LEVEL (UNFRACTIONATED): Heparin Unfractionated: >1.1 [IU]/mL — ABNORMAL HIGH (ref 0.30–0.70)

## 2023-10-24 MED ORDER — INSULIN ASPART 100 UNIT/ML IJ SOLN
0.0000 [IU] | Freq: Three times a day (TID) | INTRAMUSCULAR | Status: DC
  Administered 2023-10-24 – 2023-10-25 (×2): 2 [IU] via SUBCUTANEOUS
  Administered 2023-10-25: 3 [IU] via SUBCUTANEOUS
  Administered 2023-10-25: 2 [IU] via SUBCUTANEOUS
  Administered 2023-10-26: 3 [IU] via SUBCUTANEOUS
  Administered 2023-10-26: 2 [IU] via SUBCUTANEOUS
  Filled 2023-10-24 (×6): qty 1

## 2023-10-24 MED ORDER — PANTOPRAZOLE SODIUM 40 MG PO TBEC
40.0000 mg | DELAYED_RELEASE_TABLET | Freq: Every day | ORAL | Status: DC
Start: 2023-10-24 — End: 2023-10-26
  Administered 2023-10-25 – 2023-10-26 (×2): 40 mg via ORAL
  Filled 2023-10-24 (×2): qty 1

## 2023-10-24 MED ORDER — HEPARIN BOLUS VIA INFUSION
4500.0000 [IU] | Freq: Once | INTRAVENOUS | Status: AC
  Administered 2023-10-24: 4500 [IU] via INTRAVENOUS
  Filled 2023-10-24: qty 4500

## 2023-10-24 MED ORDER — GABAPENTIN 300 MG PO CAPS
300.0000 mg | ORAL_CAPSULE | Freq: Four times a day (QID) | ORAL | Status: DC
  Administered 2023-10-24 – 2023-10-26 (×8): 300 mg via ORAL
  Filled 2023-10-24 (×8): qty 1

## 2023-10-24 MED ORDER — HEPARIN (PORCINE) 25000 UT/250ML-% IV SOLN
1200.0000 [IU]/h | INTRAVENOUS | Status: DC
  Administered 2023-10-24: 1200 [IU]/h via INTRAVENOUS
  Filled 2023-10-24: qty 250

## 2023-10-24 MED ORDER — OXYCODONE-ACETAMINOPHEN 5-325 MG PO TABS
1.0000 | ORAL_TABLET | Freq: Four times a day (QID) | ORAL | Status: DC | PRN
  Administered 2023-10-24 – 2023-10-26 (×6): 1 via ORAL
  Filled 2023-10-24 (×6): qty 1

## 2023-10-24 MED ORDER — OXYCODONE HCL 5 MG PO TABS
5.0000 mg | ORAL_TABLET | Freq: Four times a day (QID) | ORAL | Status: DC | PRN
  Administered 2023-10-24 – 2023-10-25 (×5): 5 mg via ORAL
  Filled 2023-10-24 (×5): qty 1

## 2023-10-24 MED ORDER — BUDESON-GLYCOPYRROL-FORMOTEROL 160-9-4.8 MCG/ACT IN AERO
2.0000 | INHALATION_SPRAY | Freq: Two times a day (BID) | RESPIRATORY_TRACT | Status: DC
  Administered 2023-10-24 – 2023-10-26 (×3): 2 via RESPIRATORY_TRACT
  Filled 2023-10-24 (×2): qty 5.9

## 2023-10-24 MED ORDER — OXYCODONE-ACETAMINOPHEN 10-325 MG PO TABS: 1.0000 | ORAL_TABLET | Freq: Four times a day (QID) | ORAL | Status: DC | PRN

## 2023-10-24 MED ORDER — ATORVASTATIN CALCIUM 20 MG PO TABS
40.0000 mg | ORAL_TABLET | Freq: Every day | ORAL | Status: DC
  Administered 2023-10-24 – 2023-10-25 (×2): 40 mg via ORAL
  Filled 2023-10-24 (×2): qty 2

## 2023-10-24 MED ORDER — INSULIN ASPART 100 UNIT/ML IJ SOLN: 0.0000 [IU] | Freq: Every day | INTRAMUSCULAR | Status: DC

## 2023-10-24 MED ORDER — LEVETIRACETAM (KEPPRA) 500 MG/5 ML ADULT IV PUSH
500.0000 mg | Freq: Two times a day (BID) | INTRAVENOUS | Status: DC
  Administered 2023-10-25 – 2023-10-26 (×3): 500 mg via INTRAVENOUS
  Filled 2023-10-24 (×4): qty 5

## 2023-10-24 MED ORDER — CITALOPRAM HYDROBROMIDE 20 MG PO TABS
40.0000 mg | ORAL_TABLET | Freq: Every day | ORAL | Status: DC
  Administered 2023-10-25 – 2023-10-26 (×2): 40 mg via ORAL
  Filled 2023-10-24 (×2): qty 2

## 2023-10-24 MED ORDER — IOHEXOL 300 MG/ML  SOLN
100.0000 mL | Freq: Once | INTRAMUSCULAR | Status: AC | PRN
  Administered 2023-10-24: 100 mL via INTRAVENOUS

## 2023-10-24 NOTE — Progress Notes (Signed)
 NEUROLOGY CONSULT FOLLOW UP NOTE   Date of service: October 24, 2023 Patient Name: [name] MRN:  [id number] DOB:  July 19, 1965  Interval Hx/subjective   She is clearer today, but still feels confused  Vitals   Vitals:   10/24/23 1300 10/24/23 1400 10/24/23 1434 10/24/23 1629  BP: 125/67 131/66 122/63 123/74  Pulse: 84 80 82 79  Resp:   16 17  Temp:   98.4 F (36.9 C) 98.3 F (36.8 C)  TempSrc:   Oral Oral  SpO2: 100% 98% 98% 100%  Weight:      Height:         Body mass index is 32.02 kg/m.  Physical Exam   Constitutional: Appears well-developed and well-nourished.  Neurologic Examination    MS: she is oriented to month, place, year. She is unable to tell me how many quarters are in $2.75 CN: EOMI, face symmetric Motor: MAEW Sensory: intact to LT  Medications  Current Facility-Administered Medications:    acetaminophen  (TYLENOL ) tablet 650 mg, 650 mg, Oral, Q4H PRN **OR** acetaminophen  (TYLENOL ) suppository 650 mg, 650 mg, Rectal, Q4H PRN, Cleatus Delayne GAILS, MD, 650 mg at 10/23/23 9385   albuterol  (PROVENTIL ) (2.5 MG/3ML) 0.083% nebulizer solution 2.5 mg, 2.5 mg, Nebulization, Q4H PRN, Cleatus Delayne GAILS, MD   atorvastatin  (LIPITOR) tablet 40 mg, 40 mg, Oral, QHS, Wouk, Devaughn Sayres, MD   budesonide -glycopyrrolate -formoterol  (BREZTRI ) 160-9-4.8 MCG/ACT inhaler 2 puff, 2 puff, Inhalation, BID, Wouk, Devaughn Sayres, MD   citalopram  (CELEXA ) tablet 40 mg, 40 mg, Oral, Daily, Wouk, Devaughn Sayres, MD   gabapentin  (NEURONTIN ) capsule 300 mg, 300 mg, Oral, QID, Wouk, Devaughn Sayres, MD, 300 mg at 10/24/23 1526   insulin  aspart (novoLOG ) injection 0-15 Units, 0-15 Units, Subcutaneous, TID WC, Wouk, Devaughn Sayres, MD   insulin  aspart (novoLOG ) injection 0-5 Units, 0-5 Units, Subcutaneous, QHS, Wouk, Devaughn Sayres, MD   levETIRAcetam (KEPPRA) undiluted injection 1,000 mg, 1,000 mg, Intravenous, Q12H, Paudel, Keshab, MD, 1,000 mg at 10/24/23 1527   LORazepam  (ATIVAN ) injection 2 mg, 2  mg, Intravenous, Q5 Min x 2 PRN, Cleatus Delayne GAILS, MD, 2 mg at 10/23/23 0120   ondansetron  (ZOFRAN ) tablet 4 mg, 4 mg, Oral, Q6H PRN **OR** ondansetron  (ZOFRAN ) injection 4 mg, 4 mg, Intravenous, Q6H PRN, Cleatus Delayne GAILS, MD   Oral care mouth rinse, 15 mL, Mouth Rinse, Q2H, Cleatus Delayne GAILS, MD, 15 mL at 10/23/23 1519   Oral care mouth rinse, 15 mL, Mouth Rinse, PRN, Cleatus Delayne GAILS, MD   oxyCODONE -acetaminophen  (PERCOCET/ROXICET) 5-325 MG per tablet 1 tablet, 1 tablet, Oral, Q6H PRN **AND** oxyCODONE  (Oxy IR/ROXICODONE ) immediate release tablet 5 mg, 5 mg, Oral, Q6H PRN, Wouk, Devaughn Sayres, MD   pantoprazole  (PROTONIX ) EC tablet 40 mg, 40 mg, Oral, Daily, Wouk, Devaughn Sayres, MD  Labs and Diagnostic Imaging   CSF WBC 2 CSF RBC 5 Csf protein 52 Csf glucose 66  Meningitis panel negative(incudes hsv pcr)  EEG is negative  Imaging(Personally reviewed): MRI brain - no acute findimgs  Assessment   [name] is a 58 y.o. female with new onset seizure and post-ictal prolonged mild confusion.  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  With headache, new onset seizure, and persistent confusion in the setting of fever, an LP was indicated and luckily did not show any signs of CNS infection.  She does appear to be improving, though she feels she is not back at baseline.  My suspicion is that what ever  caused her fever caused her to have a lowered seizure threshold resulting in unmasking predisposition.  With her previous cortical infarct, I would favor continuing antiepileptic therapy indefinitely but I do not know that she needs 1000 mg twice daily of Keppra and would reduce this to 500 mg twice daily.  She is also on gabapentin  as well.  Antibiotics have already been discontinued  Recommendations  Decrease Keppra to 500 mg twice daily Continue home dose gabapentin  Neurology will  follow ______________________________________________________________________   Signed, Aisha Seals, MD [facility]

## 2023-10-24 NOTE — Progress Notes (Addendum)
 PROGRESS NOTE    [name]  FMW:[id number] DOB: 1965/12/25 DOA: 10/22/2023 PCP: Edman Marsa PARAS, DO     Brief Narrative:   From admission h and p  : [name] is a pleasant 58 y.o. female with medical history significant for recurrent stroke with chronic residual right-sided weakness, HTN, HLD, PVD, DM, who presented to ED at [facility] for an episode of seizure.  Patient was recently admitted and discharged from [facility] between 10/19/2023 and 10/20/2023 after being treated for a splenic infarct.  Patient was discharged home on Eliquis and held Plavix  when patient was on Eliquis. Per EMS, husband at home witnessed seizure lasting approximately 1 minute, postictal confusion, no history of seizure in the past.  Patient has a history of stroke with right-sided deficits.  Recently admitted to the hospital for a splenic lesion.  No evidence of injury to tongue or incontinence.  Patient oriented to self but not to place or situation.  EMS vitals blood glucose 159, 137/75, 98.5 F axillary.   Patient was sedated with Ativan  in the night.  She was not able to provide single history with me.  I did speak to patient's husband who said patient was eating dinner with husband and suddenly she had a seizure-like activities as mentioned above.  Denies any fever, chills, nausea, vomiting, dysuria, abdominal pain prior to that.  No history of trauma to head.  Assessment & Plan:   Principal Problem:   Seizure (HCC) Active Problems:   PVD (peripheral vascular disease)   Type 2 diabetes mellitus with peripheral vascular disease (HCC)   History of cerebrovascular accident (CVA) with residual deficit   HTN (hypertension)   COPD (chronic obstructive pulmonary disease) (HCC)   GERD (gastroesophageal reflux disease)   Tobacco abuse   Obesity (BMI 30-39.9)   Chronic hypoxemic respiratory failure (HCC)   Hemiparesis of right dominant side as late effect of cerebrovascular disease (HCC)   Splenic  infarction  # Seizure Prior strokes likely the primary driver, possible infection may have precipitated. MRI brain nothing acute. Post-ictal confusion resolved. EEG no ongoing seizure activity - neuro following - continue keppra - seizure precautions  # Fever Etiology unclear. None last 24 hrs. Can be consequence of seizure though can also provoke. Today ROS negative. Meningitis eval negative. Covid/flu/rsv neg, denies respiratory symptoms. No GI symptoms. Was recently diagnosed w/ splenic infarct which can be infectious. UA not suggestive of infection. CT shows signs atypical lung infection. Viral panels neg. Full body skin exam today no signs active infection. - follow blood cultures, ngtd - d/c meningitis abx but will continue ceftriaxone  and add azithromycin   # Recent splenic infarct ruled out Radiologist makes clear that splenic lesion is chronic and is not an infarct - d/c anticoagulation  # History stroke # right-sided hemiplegia MRI stable - home statin  # COPD Quiescent - breztri  for home trelegy  # T2DM Euglycemic - ssi  # MDD - home celexa   # Chronic pain - home gabapentin , resume at half dose 300 q6 - home oxy  # Obesity noted  DVT prophylaxis: lovenox  Code Status: dnr/dni Family Communication: husband updated telephonically 10/11  Level of care: Progressive Status is: Inpatient Remains inpatient appropriate because: severity of illness    Consultants:  none  Procedures: LP  Antimicrobials:  S/p vanc/ceftriaxone     Subjective: Reports feeling fine just run down  Objective: Vitals:   10/24/23 0132 10/24/23 0430 10/24/23 0530 10/24/23 0540  BP: 137/72 (!) 133/51 132/74  Pulse: 79 76 77   Resp: 18 (!) 23 (!) 23   Temp:    98.5 F (36.9 C)  TempSrc:    Oral  SpO2: 100% 99% 98%   Weight:      Height:        Intake/Output Summary (Last 24 hours) at 10/24/2023 0825 Last data filed at 10/24/2023 9185 Gross per 24 hour  Intake  2872.15 ml  Output --  Net 2872.15 ml   Filed Weights   10/22/23 2137  Weight: 90 kg    Examination:  General exam: Appears calm and comfortable, chronically ill appearing Respiratory system: Clear to auscultation. Respiratory effort normal. Cardiovascular system: S1 & S2 heard, RRR.   Gastrointestinal system: Abdomen is obese, soft and nontender.   Central nervous system: Alert and oriented. Right sided weakness Extremities: warm, no edema Skin: some echymoses to arms Psychiatry: Judgement and insight appear normal. Mood & affect appropriate.     Data Reviewed: I have personally reviewed following labs and imaging studies  CBC: Recent Labs  Lab 10/19/23 0318 10/20/23 0341 10/22/23 2235 10/23/23 0419 10/24/23 0224  WBC 11.4* 8.9 10.8* 13.7* 9.8  NEUTROABS 8.8*  --  8.6* 9.6*  --   HGB 13.7 12.4 14.0 13.2 11.7*  HCT 42.0 39.0 42.7 41.0 35.8*  MCV 85.9 86.7 85.6 85.8 86.7  PLT 354 327 340 377 286   Basic Metabolic Panel: Recent Labs  Lab 10/19/23 0318 10/20/23 0341 10/22/23 2235 10/23/23 0419 10/23/23 1517 10/24/23 0224  NA 136 133* 136 136 134*  --   K 4.1 3.6 3.9 3.7 4.0  --   CL 98 100 101 101 101  --   CO2 23 25 20* 21* 21*  --   GLUCOSE 156* 103* 185* 127* 161*  --   BUN 12 15 10 15 12   --   CREATININE 0.55 0.78 0.72 0.66 0.67 0.62  CALCIUM  9.3 8.5* 9.2 9.0 8.4*  --   MG 1.8  --   --   --   --  1.7   GFR: Estimated Creatinine Clearance: 84.5 mL/min (by C-G formula based on SCr of 0.62 mg/dL). Liver Function Tests: Recent Labs  Lab 10/19/23 0318 10/22/23 2235 10/23/23 0419 10/23/23 1517  AST 19 21 16 18   ALT 11 11 10 11   ALKPHOS 64 60 57 48  BILITOT 0.5 0.6 0.6 1.1  PROT 7.4 7.3 7.3 6.5  ALBUMIN 3.5 3.7 3.6 3.2*   No results for input(s): LIPASE, AMYLASE in the last 168 hours. No results for input(s): AMMONIA in the last 168 hours. Coagulation Profile: Recent Labs  Lab 10/19/23 1021 10/23/23 0419  INR 1.0 1.2   Cardiac  Enzymes: No results for input(s): CKTOTAL, CKMB, CKMBINDEX, TROPONINI in the last 168 hours. BNP (last 3 results) No results for input(s): PROBNP in the last 8760 hours. HbA1C: No results for input(s): HGBA1C in the last 72 hours. CBG: Recent Labs  Lab 10/23/23 1515 10/23/23 1848 10/24/23 0022 10/24/23 0805  GLUCAP 167* 104* 97 97   Lipid Profile: No results for input(s): CHOL, HDL, LDLCALC, TRIG, CHOLHDL, LDLDIRECT in the last 72 hours. Thyroid  Function Tests: No results for input(s): TSH, T4TOTAL, FREET4, T3FREE, THYROIDAB in the last 72 hours. Anemia Panel: No results for input(s): VITAMINB12, FOLATE, FERRITIN, TIBC, IRON, RETICCTPCT in the last 72 hours. Urine analysis:    Component Value Date/Time   COLORURINE YELLOW (A) 10/23/2023 0012   APPEARANCEUR CLEAR (A) 10/23/2023 0012   APPEARANCEUR Clear 09/08/2022 1446  LABSPEC 1.032 (H) 10/23/2023 0012   LABSPEC 1.030 09/23/2013 0154   PHURINE 5.0 10/23/2023 0012   GLUCOSEU NEGATIVE 10/23/2023 0012   GLUCOSEU >=500 09/23/2013 0154   HGBUR NEGATIVE 10/23/2023 0012   BILIRUBINUR NEGATIVE 10/23/2023 0012   BILIRUBINUR Negative 09/08/2022 1446   BILIRUBINUR Negative 09/23/2013 0154   KETONESUR 5 (A) 10/23/2023 0012   PROTEINUR 100 (A) 10/23/2023 0012   UROBILINOGEN 0.2 04/21/2022 1445   UROBILINOGEN 0.2 03/11/2012 1234   NITRITE NEGATIVE 10/23/2023 0012   LEUKOCYTESUR NEGATIVE 10/23/2023 0012   LEUKOCYTESUR Negative 09/23/2013 0154   Sepsis Labs: @LABRCNTIP (procalcitonin:4,lacticidven:4)  ) Recent Results (from the past 240 hours)  Resp panel by RT-PCR (RSV, Flu A&B, Covid) Anterior Nasal Swab     Status: None   Collection Time: 10/19/23  3:45 AM   Specimen: Anterior Nasal Swab  Result Value Ref Range Status   SARS Coronavirus 2 by RT PCR NEGATIVE NEGATIVE Final    Comment: (NOTE) SARS-CoV-2 target nucleic acids are NOT DETECTED.  The SARS-CoV-2 RNA is generally  detectable in upper respiratory specimens during the acute phase of infection. The lowest concentration of SARS-CoV-2 viral copies this assay can detect is 138 copies/mL. A negative result does not preclude SARS-Cov-2 infection and should not be used as the sole basis for treatment or other patient management decisions. A negative result may occur with  improper specimen collection/handling, submission of specimen other than nasopharyngeal swab, presence of viral mutation(s) within the areas targeted by this assay, and inadequate number of viral copies(<138 copies/mL). A negative result must be combined with clinical observations, patient history, and epidemiological information. The expected result is Negative.  Fact Sheet for Patients:  BloggerCourse.com  Fact Sheet for Healthcare Providers:  SeriousBroker.it  This test is no t yet approved or cleared by the [place]  FDA and  has been authorized for detection and/or diagnosis of SARS-CoV-2 by FDA under an Emergency Use Authorization (EUA). This EUA will remain  in effect (meaning this test can be used) for the duration of the COVID-19 declaration under Section 564(b)(1) of the Act, 21 U.S.C.section 360bbb-3(b)(1), unless the authorization is terminated  or revoked sooner.       Influenza A by PCR NEGATIVE NEGATIVE Final   Influenza B by PCR NEGATIVE NEGATIVE Final    Comment: (NOTE) The Xpert Xpress SARS-CoV-2/FLU/RSV plus assay is intended as an aid in the diagnosis of influenza from Nasopharyngeal swab specimens and should not be used as a sole basis for treatment. Nasal washings and aspirates are unacceptable for Xpert Xpress SARS-CoV-2/FLU/RSV testing.  Fact Sheet for Patients: BloggerCourse.com  Fact Sheet for Healthcare Providers: SeriousBroker.it  This test is not yet approved or cleared by the [place]  FDA  and has been authorized for detection and/or diagnosis of SARS-CoV-2 by FDA under an Emergency Use Authorization (EUA). This EUA will remain in effect (meaning this test can be used) for the duration of the COVID-19 declaration under Section 564(b)(1) of the Act, 21 U.S.C. section 360bbb-3(b)(1), unless the authorization is terminated or revoked.     Resp Syncytial Virus by PCR NEGATIVE NEGATIVE Final    Comment: (NOTE) Fact Sheet for Patients: BloggerCourse.com  Fact Sheet for Healthcare Providers: SeriousBroker.it  This test is not yet approved or cleared by the [place]  FDA and has been authorized for detection and/or diagnosis of SARS-CoV-2 by FDA under an Emergency Use Authorization (EUA). This EUA will remain in effect (meaning this test can be used) for the duration of the COVID-19 declaration  under Section 564(b)(1) of the Act, 21 U.S.C. section 360bbb-3(b)(1), unless the authorization is terminated or revoked.  Performed at [facility], [address] [place], [place] [address]   Culture, blood (x 2)     Status: None (Preliminary result)   Collection Time: 10/23/23  4:19 AM   Specimen: BLOOD  Result Value Ref Range Status   Specimen Description BLOOD BLOOD RIGHT HAND  Final   Special Requests   Final    BOTTLES DRAWN AEROBIC AND ANAEROBIC Blood Culture results may not be optimal due to an inadequate volume of blood received in culture bottles   Culture   Final    NO GROWTH 1 DAY Performed at [facility], [address] [place], [place] [address]    Report Status PENDING  Incomplete  Culture, blood (x 2)     Status: None (Preliminary result)   Collection Time: 10/23/23  4:19 AM   Specimen: BLOOD  Result Value Ref Range Status   Specimen Description BLOOD BLOOD LEFT HAND  Final   Special Requests   Final    BOTTLES DRAWN AEROBIC AND ANAEROBIC Blood Culture results may not be optimal due  to an inadequate volume of blood received in culture bottles   Culture   Final    NO GROWTH < 24 HOURS Performed at [facility], [address] [place], [place] [address]    Report Status PENDING  Incomplete  Resp panel by RT-PCR (RSV, Flu A&B, Covid) Anterior Nasal Swab     Status: None   Collection Time: 10/23/23  8:57 AM   Specimen: Anterior Nasal Swab  Result Value Ref Range Status   SARS Coronavirus 2 by RT PCR NEGATIVE NEGATIVE Final    Comment: (NOTE) SARS-CoV-2 target nucleic acids are NOT DETECTED.  The SARS-CoV-2 RNA is generally detectable in upper respiratory specimens during the acute phase of infection. The lowest concentration of SARS-CoV-2 viral copies this assay can detect is 138 copies/mL. A negative result does not preclude SARS-Cov-2 infection and should not be used as the sole basis for treatment or other patient management decisions. A negative result may occur with  improper specimen collection/handling, submission of specimen other than nasopharyngeal swab, presence of viral mutation(s) within the areas targeted by this assay, and inadequate number of viral copies(<138 copies/mL). A negative result must be combined with clinical observations, patient history, and epidemiological information. The expected result is Negative.  Fact Sheet for Patients:  BloggerCourse.com  Fact Sheet for Healthcare Providers:  SeriousBroker.it  This test is no t yet approved or cleared by the [place]  FDA and  has been authorized for detection and/or diagnosis of SARS-CoV-2 by FDA under an Emergency Use Authorization (EUA). This EUA will remain  in effect (meaning this test can be used) for the duration of the COVID-19 declaration under Section 564(b)(1) of the Act, 21 U.S.C.section 360bbb-3(b)(1), unless the authorization is terminated  or revoked sooner.       Influenza A by PCR NEGATIVE NEGATIVE Final    Influenza B by PCR NEGATIVE NEGATIVE Final    Comment: (NOTE) The Xpert Xpress SARS-CoV-2/FLU/RSV plus assay is intended as an aid in the diagnosis of influenza from Nasopharyngeal swab specimens and should not be used as a sole basis for treatment. Nasal washings and aspirates are unacceptable for Xpert Xpress SARS-CoV-2/FLU/RSV testing.  Fact Sheet for Patients: BloggerCourse.com  Fact Sheet for Healthcare Providers: SeriousBroker.it  This test is not yet approved or cleared by the [place]   FDA and has been authorized for detection and/or diagnosis of SARS-CoV-2 by FDA under an Emergency Use Authorization (EUA). This EUA will remain in effect (meaning this test can be used) for the duration of the COVID-19 declaration under Section 564(b)(1) of the Act, 21 U.S.C. section 360bbb-3(b)(1), unless the authorization is terminated or revoked.     Resp Syncytial Virus by PCR NEGATIVE NEGATIVE Final    Comment: (NOTE) Fact Sheet for Patients: BloggerCourse.com  Fact Sheet for Healthcare Providers: SeriousBroker.it  This test is not yet approved or cleared by the [place]  FDA and has been authorized for detection and/or diagnosis of SARS-CoV-2 by FDA under an Emergency Use Authorization (EUA). This EUA will remain in effect (meaning this test can be used) for the duration of the COVID-19 declaration under Section 564(b)(1) of the Act, 21 U.S.C. section 360bbb-3(b)(1), unless the authorization is terminated or revoked.  Performed at [facility], [address] [place], [place] [address]   CSF culture w Gram Stain     Status: None (Preliminary result)   Collection Time: 10/23/23  5:31 PM   Specimen: PATH Cytology CSF; Cerebrospinal Fluid  Result Value Ref Range Status   Specimen Description CSF  Final   Special Requests NONE  Final   Gram Stain   Final     WBC SEEN RED BLOOD CELLS PRESENT NO ORGANISMS SEEN Performed at [facility], [address] [place], [place] [address]    Culture PENDING  Incomplete   Report Status PENDING  Incomplete         Radiology Studies: MR BRAIN WO CONTRAST Result Date: 10/23/2023 EXAM: MR Brain without Intravenous Contrast. CLINICAL HISTORY: Seizure, new-onset, no history of trauma. Pt to ED via EMS from home c/o seizures tonight. Per EMS husband at home witnessed seizures lasting approx 1 min, post ictal, no hx of seizures per husband. Pt has hx of stroke with right side deficits. TECHNIQUE: Magnetic resonance images of the brain without intravenous contrast in multiple planes. CONTRAST: Without. COMPARISON: 10/22/2023 CT head. MRI head 06/03/2023. FINDINGS: BRAIN: Motion limited. Left PCA territory infarct. Remote left basal ganglia infarct. Remote left cerebellar lacunar infarcts. Moderate T2 FLAIR hyperintensities in the white matter, compatible with chronic microvascular ischemic change. No restricted diffusion to indicate acute infarction. No intracranial mass or hemorrhage. No midline shift or extra-axial fluid collection. The central arterial and venous flow voids are patent. VENTRICLES: No hydrocephalus. ORBITS: The orbits are normal. SINUSES AND MASTOIDS: The sinuses and mastoid air cells are clear. BONES: No acute fracture or focal osseous lesion. IMPRESSION: 1. No evidence of acute intracranial abnormality. 2. Remote left PCA territory, left basal ganglia, and cerebellar infarcts. Electronically signed by: Gilmore Molt MD 10/23/2023 10:55 PM EDT RP Workstation: [id number]   EEG adult Result Date: 10/23/2023 Deanna Aisha SQUIBB, MD     10/23/2023 10:00 PM History: 58 yo F being evaluated for seizures EEG Duration: 22 minutes Sedation: none Patient State: Awake and asleep Technique: This EEG was acquired with electrodes placed according to the International 10-20 electrode system (including  Fp1, Fp2, F3, F4, C3, C4, P3, P4, O1, O2, T3, T4, T5, T6, A1, A2, Fz, Cz, Pz). The following electrodes were missing or displaced: none. Background: There is a posterior dominant rhythm of 8.5 Hz thatis intermixed with prominent irregular generalized delta and theta range activity. There is no clear epileptiform discharges seen. Sleep is briefly recorded with normal appearing structures. Photic stimulation: Physiologic driving is not performed EEG Abnormalities: 1) generalized  irregular slow activity. Clinical Interpretation: This eeg is consistent with a generalized non-specific cerebral dysfunction(encephalopathy). There was no seizure or seizure predisposition recorded on this study. Please note that lack of epileptiform activity on EEG does not preclude the possibility of epilepsy. Aisha Seals, MD [facility] If 7pm- 7am, please page neurology on call as listed in AMION.  DG FL GUIDED LUMBAR PUNCTURE Result Date: 10/23/2023 CLINICAL DATA:  Provided history: Encephalopathy. Request received for fluoroscopically-guided diagnostic lumbar puncture. EXAM: LUMBAR PUNCTURE UNDER FLUOROSCOPY FLUOROSCOPY: Radiation Exposure Index (as provided by the fluoroscopic device): 18.40 mGy Kerma PROCEDURE: The patient was positioned prone on the fluoroscopy table. An appropriate skin entry site was determined under fluoroscopy and marked. The operator donned sterile gloves and a mask. The lower back was prepped and draped in the usual sterile fashion. Local anesthesia was provided with 1% lidocaine . Under intermittent fluoroscopy, lumbar puncture was performed at the L5-S1 level using a 20 gauge spinal needle with return of clear/colorless CSF and an opening pressure of 12 cm water . 17 mL of CSF were collected for laboratory studies. The inner stylet was replaced within the needle and the needle was removed in its entirety. A dressing was applied at the skin entry site. The patient tolerated the procedure  well and no immediate post-procedure complication was apparent. The procedure was performed by Sherrilee Bal PA-C, supervised by Dr. Rockey Childs. IMPRESSION: 1. Fluoroscopically-guided L5-S1 lumbar puncture. 2. Opening pressure: 12 cm water . 3. 17 mL of CSF collected and sent for laboratory studies. 4. No immediate post-procedure complication. Electronically Signed   By: Rockey Childs D.O.   On: 10/23/2023 18:37   DG Chest Port 1 View Result Date: 10/23/2023 EXAM: 1 VIEW XRAY OF THE CHEST 10/23/2023 04:19:00 AM COMPARISON: Portable chest 10/19/2023 and earlier. CLINICAL HISTORY: 58 year old female. Sepsis. Hx of COPD, diabetes, HTN, and current smoker. FINDINGS: LUNGS AND PLEURA: Mildly lower lung volumes. No pulmonary edema. No pleural effusion. No pneumothorax. When allowing for portable technique, the lungs are clear. HEART AND MEDIASTINUM: Mediastinal contours remain normal. No acute abnormality of the cardiac silhouette. BONES AND SOFT TISSUES: Chronic cervical ACDF. No acute osseous abnormality. IMPRESSION: 1. Lower lung volumes, no acute cardiopulmonary abnormality. Electronically signed by: Helayne Hurst MD 10/23/2023 04:35 AM EDT RP Workstation: [id number]   CT Head Wo Contrast Result Date: 10/22/2023 CLINICAL DATA:  Fall, seizure on Xarelto; fall pain EXAM: CT HEAD WITHOUT CONTRAST CT CERVICAL SPINE WITHOUT CONTRAST TECHNIQUE: Multidetector CT imaging of the head and cervical spine was performed following the standard protocol without intravenous contrast. Multiplanar CT image reconstructions of the cervical spine were also generated. RADIATION DOSE REDUCTION: This exam was performed according to the departmental dose-optimization program which includes automated exposure control, adjustment of the mA and/or kV according to patient size and/or use of iterative reconstruction technique. COMPARISON:  CT head 06/02/2023 FINDINGS: CT HEAD FINDINGS Brain: Patchy and confluent areas of decreased  attenuation are noted throughout the deep and periventricular white matter of the cerebral hemispheres bilaterally, compatible with chronic microvascular ischemic disease. Left basal ganglia, temporal, occipital encephalomalacia. No evidence of large-territorial acute infarction. No parenchymal hemorrhage. No mass lesion. No extra-axial collection. No mass effect or midline shift. No hydrocephalus. Basilar cisterns are patent. Vascular: No hyperdense vessel. Atherosclerotic calcifications are present within the cavernous internal carotid arteries. Skull: No acute fracture or focal lesion. Sinuses/Orbits: Paranasal sinuses and mastoid air cells are clear. The orbits are unremarkable. Other: None. CT CERVICAL SPINE FINDINGS Alignment: Normal. Skull base  and vertebrae: C5-C6 anterior cervical discectomy and fusion surgical hardware. No acute fracture. No aggressive appearing focal osseous lesion or focal pathologic process. Soft tissues and spinal canal: No prevertebral fluid or swelling. No visible canal hematoma. Upper chest: Unremarkable. Other: None. IMPRESSION: 1. No acute intracranial abnormality. 2. No acute displaced fracture or traumatic listhesis of the cervical spine. Electronically Signed   By: Morgane  Naveau M.D.   On: 10/22/2023 22:44   CT Cervical Spine Wo Contrast Result Date: 10/22/2023 CLINICAL DATA:  Fall, seizure on Xarelto; fall pain EXAM: CT HEAD WITHOUT CONTRAST CT CERVICAL SPINE WITHOUT CONTRAST TECHNIQUE: Multidetector CT imaging of the head and cervical spine was performed following the standard protocol without intravenous contrast. Multiplanar CT image reconstructions of the cervical spine were also generated. RADIATION DOSE REDUCTION: This exam was performed according to the departmental dose-optimization program which includes automated exposure control, adjustment of the mA and/or kV according to patient size and/or use of iterative reconstruction technique. COMPARISON:  CT head  06/02/2023 FINDINGS: CT HEAD FINDINGS Brain: Patchy and confluent areas of decreased attenuation are noted throughout the deep and periventricular white matter of the cerebral hemispheres bilaterally, compatible with chronic microvascular ischemic disease. Left basal ganglia, temporal, occipital encephalomalacia. No evidence of large-territorial acute infarction. No parenchymal hemorrhage. No mass lesion. No extra-axial collection. No mass effect or midline shift. No hydrocephalus. Basilar cisterns are patent. Vascular: No hyperdense vessel. Atherosclerotic calcifications are present within the cavernous internal carotid arteries. Skull: No acute fracture or focal lesion. Sinuses/Orbits: Paranasal sinuses and mastoid air cells are clear. The orbits are unremarkable. Other: None. CT CERVICAL SPINE FINDINGS Alignment: Normal. Skull base and vertebrae: C5-C6 anterior cervical discectomy and fusion surgical hardware. No acute fracture. No aggressive appearing focal osseous lesion or focal pathologic process. Soft tissues and spinal canal: No prevertebral fluid or swelling. No visible canal hematoma. Upper chest: Unremarkable. Other: None. IMPRESSION: 1. No acute intracranial abnormality. 2. No acute displaced fracture or traumatic listhesis of the cervical spine. Electronically Signed   By: Morgane  Naveau M.D.   On: 10/22/2023 22:44        Scheduled Meds:  apixaban  5 mg Oral BID   insulin  aspart  0-9 Units Subcutaneous TID WC   levETIRAcetam  1,000 mg Intravenous Q12H   mouth rinse  15 mL Mouth Rinse Q2H   Continuous Infusions:   LOS: 1 day     Devaughn KATHEE Ban, MD [facility]   If 7PM-7AM, please contact night-coverage [email or web address] Password [email or web address] 10/24/2023, 8:25 AM

## 2023-10-24 NOTE — Consult Note (Signed)
 PHARMACY - ANTICOAGULATION CONSULT NOTE  Pharmacy Consult for Heparin   Indication: splenic infarct  Allergies  Allergen Reactions   Tramadol Nausea And Vomiting and Hypertension   Augmentin [Amoxicillin-Pot Clavulanate] Rash    Tolerated Zosyn  03/26/23    Patient Measurements: Height: 5' 6 (167.6 cm) Weight: 90 kg (198 lb 6.6 oz) IBW/kg (Calculated) : 59.3 HEPARIN  DW (KG): 78.9  Vital Signs: Temp: 98.5 F (36.9 C) (10/11 0540) Temp Source: Oral (10/11 0540) BP: 132/74 (10/11 0530) Pulse Rate: 77 (10/11 0530)  Labs: Recent Labs    10/22/23 2235 10/23/23 0419 10/23/23 1517 10/24/23 0224  HGB 14.0 13.2  --  11.7*  HCT 42.7 41.0  --  35.8*  PLT 340 377  --  286  APTT  --  31  --   --   LABPROT  --  15.6*  --   --   INR  --  1.2  --   --   CREATININE 0.72 0.66 0.67 0.62    Estimated Creatinine Clearance: 84.5 mL/min (by C-G formula based on SCr of 0.62 mg/dL).   Medical History: Past Medical History:  Diagnosis Date   Anxiety    Cancer (HCC)    a spot on liver and treated    Complication of anesthesia    restless,easily upset   COPD (chronic obstructive pulmonary disease) (HCC)    Depression    Diabetes mellitus without complication (HCC)    Diverticulitis    Fatty liver    GERD (gastroesophageal reflux disease)    Headache(784.0)    migraines   History of kidney stones    Hyperlipidemia    Hypertension    PAD (peripheral artery disease)    Pneumonia    Restless    Stroke (HCC)     Medications:  (Not in a hospital admission)  Scheduled:   atorvastatin   40 mg Oral QHS   budesonide -glycopyrrolate -formoterol   2 puff Inhalation BID   citalopram   40 mg Oral Daily   gabapentin   300 mg Oral QID   insulin  aspart  0-15 Units Subcutaneous TID WC   insulin  aspart  0-5 Units Subcutaneous QHS   levETIRAcetam  1,000 mg Intravenous Q12H   mouth rinse  15 mL Mouth Rinse Q2H   pantoprazole   40 mg Oral Daily   Infusions:   Assessment: 58 y.o. female with  hx of diabetes, hypertension, hyperlipidemia, previous stroke who presents with new onset seizure. Pt was discharged on apixaban for a splenic infarct, but Pt still having fevers and plan to transition to heparin  infusion in case pt needs any procedures. Hgb slightly trending down but possibly hemodilutional. Will use aPTT to monitor heparin  infusion, as heparin  level is falsely elevated due to recent apixaban (10/10 @23 :13).   Goal of Therapy:  Heparin  level 0.3-0.7 units/ml once aPTT and heparin  level correlate.  aPTT 66-102 seconds Monitor platelets by anticoagulation protocol: Yes   Plan:  Give 4500 units bolus x 1 Start heparin  infusion at 1200 units/hr Check aPTT level in 6 hours and daily heparin  level and CBC while on heparin  Continue to monitor H&H and platelets  [name] Blanch, PharmD, BCPS 10/24/2023,9:08 AM

## 2023-10-24 NOTE — Progress Notes (Signed)
  Echocardiogram 2D Echocardiogram has been performed.  [name] Louder 10/24/2023, 11:49 AM

## 2023-10-24 NOTE — TOC Progression Note (Deleted)
 Transition of Care [facility]) - Progression Note    Patient Details  Name: [name] MRN: [id number] Date of Birth: 11-Apr-1965  Transition of Care [facility]) CM/SW Contact  Corrie JINNY Ruts, LCSW Phone Number: 10/24/2023, 10:59 AM  Clinical Narrative:    Chart reviewed. I attempted to speak with the patient at bedside. I called out to the patient 3x and the patient was asleep and did not wake up. I called the patient spouse and left a HIPAA compliant voicemail. I am waiting for the patient spouse to call back. Per MD the patient is from [facility].                      Expected Discharge Plan and Services                                               Social Drivers of Health (SDOH) Interventions SDOH Screenings   Food Insecurity: No Food Insecurity (10/19/2023)  Housing: Low Risk  (10/19/2023)  Transportation Needs: No Transportation Needs (10/19/2023)  Utilities: Not At Risk (10/19/2023)  Alcohol  Screen: Low Risk  (05/22/2023)  Depression (PHQ2-9): High Risk (06/23/2023)  Financial Resource Strain: Low Risk  (05/22/2023)  Physical Activity: Inactive (05/22/2023)  Social Connections: Moderately Isolated (06/02/2023)  Stress: No Stress Concern Present (05/22/2023)  Tobacco Use: High Risk (10/22/2023)  Health Literacy: Adequate Health Literacy (05/22/2023)    Readmission Risk Interventions     No data to display

## 2023-10-24 NOTE — ED Notes (Signed)
 Rocephin  is delayed due to no more being down in the pyxis. Called pharmacy and they are going to send one down here.

## 2023-10-25 ENCOUNTER — Inpatient Hospital Stay

## 2023-10-25 DIAGNOSIS — R569 Unspecified convulsions: Secondary | ICD-10-CM | POA: Diagnosis not present

## 2023-10-25 LAB — BASIC METABOLIC PANEL WITH GFR
Anion gap: 9 (ref 5–15)
BUN: 12 mg/dL (ref 6–20)
CO2: 24 mmol/L (ref 22–32)
Calcium: 8.2 mg/dL — ABNORMAL LOW (ref 8.9–10.3)
Chloride: 104 mmol/L (ref 98–111)
Creatinine, Ser: 0.54 mg/dL (ref 0.44–1.00)
GFR, Estimated: >60 mL/min (ref >60)
Glucose, Bld: 144 mg/dL — ABNORMAL HIGH (ref 70–99)
Potassium: 3.2 mmol/L — ABNORMAL LOW (ref 3.5–5.1)
Sodium: 137 mmol/L (ref 135–145)

## 2023-10-25 LAB — GLUCOSE, CAPILLARY
Glucose-Capillary: 131 mg/dL — ABNORMAL HIGH (ref 70–99)
Glucose-Capillary: 149 mg/dL — ABNORMAL HIGH (ref 70–99)
Glucose-Capillary: 152 mg/dL — ABNORMAL HIGH (ref 70–99)
Glucose-Capillary: 162 mg/dL — ABNORMAL HIGH (ref 70–99)

## 2023-10-25 LAB — CBC
HCT: 35.3 % — ABNORMAL LOW (ref 36.0–46.0)
Hemoglobin: 11.7 g/dL — ABNORMAL LOW (ref 12.0–15.0)
MCH: 28.4 pg (ref 26.0–34.0)
MCHC: 33.1 g/dL (ref 30.0–36.0)
MCV: 85.7 fL (ref 80.0–100.0)
Platelets: 255 K/uL (ref 150–400)
RBC: 4.12 MIL/uL (ref 3.87–5.11)
RDW: 14.6 % (ref 11.5–15.5)
WBC: 8 K/uL (ref 4.0–10.5)
nRBC: 0 % (ref 0.0–0.2)

## 2023-10-25 LAB — MAGNESIUM: Magnesium: 1.8 mg/dL (ref 1.7–2.4)

## 2023-10-25 MED ORDER — MORPHINE SULFATE (PF) 2 MG/ML IV SOLN
2.0000 mg | Freq: Once | INTRAVENOUS | Status: AC
  Administered 2023-10-25: 2 mg via INTRAVENOUS
  Filled 2023-10-25: qty 1

## 2023-10-25 MED ORDER — IOHEXOL 9 MG/ML PO SOLN
500.0000 mL | ORAL | Status: AC
  Administered 2023-10-25 (×2): 500 mL via ORAL

## 2023-10-25 MED ORDER — ENOXAPARIN SODIUM 40 MG/0.4ML IJ SOSY
40.0000 mg | PREFILLED_SYRINGE | Freq: Every day | INTRAMUSCULAR | Status: DC
  Administered 2023-10-25 – 2023-10-26 (×2): 40 mg via SUBCUTANEOUS
  Filled 2023-10-25 (×2): qty 0.4

## 2023-10-25 MED ORDER — AZITHROMYCIN 250 MG PO TABS
500.0000 mg | ORAL_TABLET | Freq: Every day | ORAL | Status: DC
  Administered 2023-10-25 – 2023-10-26 (×2): 500 mg via ORAL
  Filled 2023-10-25 (×2): qty 2

## 2023-10-25 MED ORDER — SODIUM CHLORIDE 0.9 % IV SOLN
1.0000 g | Freq: Every day | INTRAVENOUS | Status: DC
  Administered 2023-10-25 – 2023-10-26 (×2): 1 g via INTRAVENOUS
  Filled 2023-10-25 (×2): qty 10

## 2023-10-25 MED ORDER — POTASSIUM CHLORIDE CRYS ER 20 MEQ PO TBCR
40.0000 meq | EXTENDED_RELEASE_TABLET | Freq: Once | ORAL | Status: AC
  Administered 2023-10-25: 40 meq via ORAL
  Filled 2023-10-25: qty 2

## 2023-10-25 NOTE — Progress Notes (Signed)
 NEUROLOGY CONSULT FOLLOW UP NOTE   Date of service: October 25, 2023 Patient Name: [name] MRN:  [id number] DOB:  November 19, 1965  Interval Hx/subjective   She is clearer today, feels at baseline  Vitals   Vitals:   10/24/23 2018 10/24/23 2352 10/25/23 0344 10/25/23 0814  BP: 95/65 107/61 98/65 123/68  Pulse: 84 78 80 77  Resp: 19 17 18    Temp: 98 F (36.7 C) 98.5 F (36.9 C) 98.1 F (36.7 C) 98 F (36.7 C)  TempSrc:  Oral Oral   SpO2: 99% 100% 98% 99%  Weight:      Height:         Body mass index is 32.02 kg/m.  Physical Exam   Constitutional: Appears well-developed and well-nourished.  Neurologic Examination    MS: she is oriented to month, place, gives wrong year(2024) CN: EOMI, face symmetric Motor: MAEW Sensory: intact to LT  Medications  Current Facility-Administered Medications:    acetaminophen  (TYLENOL ) tablet 650 mg, 650 mg, Oral, Q4H PRN **OR** acetaminophen  (TYLENOL ) suppository 650 mg, 650 mg, Rectal, Q4H PRN, Cleatus Delayne GAILS, MD, 650 mg at 10/23/23 9385   albuterol  (PROVENTIL ) (2.5 MG/3ML) 0.083% nebulizer solution 2.5 mg, 2.5 mg, Nebulization, Q4H PRN, Cleatus Delayne GAILS, MD   atorvastatin  (LIPITOR) tablet 40 mg, 40 mg, Oral, QHS, Wouk, Devaughn Sayres, MD, 40 mg at 10/24/23 2147   azithromycin  (ZITHROMAX ) tablet 500 mg, 500 mg, Oral, Daily, Wouk, Devaughn Sayres, MD, 500 mg at 10/25/23 1043   budesonide -glycopyrrolate -formoterol  (BREZTRI ) 160-9-4.8 MCG/ACT inhaler 2 puff, 2 puff, Inhalation, BID, Wouk, Devaughn Sayres, MD, 2 puff at 10/25/23 1125   cefTRIAXone  (ROCEPHIN ) 1 g in sodium chloride  0.9 % 100 mL IVPB, 1 g, Intravenous, Daily, Wouk, Devaughn Sayres, MD, Last Rate: 200 mL/hr at 10/25/23 1125, 1 g at 10/25/23 1125   citalopram  (CELEXA ) tablet 40 mg, 40 mg, Oral, Daily, Wouk, Devaughn Sayres, MD, 40 mg at 10/25/23 9161   enoxaparin  (LOVENOX ) injection 40 mg, 40 mg, Subcutaneous, Daily, Wouk, Devaughn Sayres, MD, 40 mg at 10/25/23 1044   gabapentin  (NEURONTIN )  capsule 300 mg, 300 mg, Oral, QID, Wouk, Devaughn Sayres, MD, 300 mg at 10/25/23 9161   insulin  aspart (novoLOG ) injection 0-15 Units, 0-15 Units, Subcutaneous, TID WC, Wouk, Devaughn Sayres, MD, 2 Units at 10/25/23 9161   insulin  aspart (novoLOG ) injection 0-5 Units, 0-5 Units, Subcutaneous, QHS, Wouk, Devaughn Sayres, MD   levETIRAcetam (KEPPRA) undiluted injection 500 mg, 500 mg, Intravenous, Q12H, Michaela Aisha SQUIBB, MD, 500 mg at 10/25/23 0139   LORazepam  (ATIVAN ) injection 2 mg, 2 mg, Intravenous, Q5 Min x 2 PRN, Cleatus Delayne GAILS, MD, 2 mg at 10/23/23 0120   ondansetron  (ZOFRAN ) tablet 4 mg, 4 mg, Oral, Q6H PRN **OR** ondansetron  (ZOFRAN ) injection 4 mg, 4 mg, Intravenous, Q6H PRN, Cleatus Delayne GAILS, MD   Oral care mouth rinse, 15 mL, Mouth Rinse, PRN, Cleatus Delayne GAILS, MD   oxyCODONE -acetaminophen  (PERCOCET/ROXICET) 5-325 MG per tablet 1 tablet, 1 tablet, Oral, Q6H PRN, 1 tablet at 10/25/23 0838 **AND** oxyCODONE  (Oxy IR/ROXICODONE ) immediate release tablet 5 mg, 5 mg, Oral, Q6H PRN, Wouk, Devaughn Sayres, MD, 5 mg at 10/25/23 9161   pantoprazole  (PROTONIX ) EC tablet 40 mg, 40 mg, Oral, Daily, Wouk, Devaughn Sayres, MD, 40 mg at 10/25/23 0838  Labs and Diagnostic Imaging   CSF WBC 2 CSF RBC 5 Csf protein 52 Csf glucose 66  Meningitis panel negative(incudes hsv pcr)  EEG is negative  Imaging(Personally reviewed): MRI brain - no acute findimgs  Assessment   [name] is a 58 y.o. female with new onset seizure and post-ictal prolonged mild confusion.  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  With headache, new onset seizure, and persistent confusion in the setting of fever, an LP was indicated and luckily did not show any signs of CNS infection.  She does appear to be improving, though she feels she is not back at baseline.  My suspicion is that what ever caused her fever caused her to have a lowered seizure threshold resulting in unmasking predisposition.  With  her previous cortical infarct, I would favor continuing antiepileptic therapy indefinitely.  Recommendations  Continue Keppra 500 mg twice daily Continue home dose gabapentin  Follow up with outpatient neurology ______________________________________________________________________   Signed, Aisha Seals, MD [facility]

## 2023-10-25 NOTE — Plan of Care (Signed)
  Problem: Fluid Volume: Goal: Hemodynamic stability will improve Outcome: Progressing   Problem: Clinical Measurements: Goal: Diagnostic test results will improve Outcome: Progressing Goal: Signs and symptoms of infection will decrease Outcome: Progressing   Problem: Respiratory: Goal: Ability to maintain adequate ventilation will improve Outcome: Progressing   Problem: Education: Goal: Ability to describe self-care measures that may prevent or decrease complications (Diabetes Survival Skills Education) will improve Outcome: Progressing Goal: Individualized Educational Video(s) Outcome: Progressing   Problem: Coping: Goal: Ability to adjust to condition or change in health will improve Outcome: Progressing   Problem: Fluid Volume: Goal: Ability to maintain a balanced intake and output will improve Outcome: Progressing   Problem: Health Behavior/Discharge Planning: Goal: Ability to identify and utilize available resources and services will improve Outcome: Progressing Goal: Ability to manage health-related needs will improve Outcome: Progressing   Problem: Metabolic: Goal: Ability to maintain appropriate glucose levels will improve Outcome: Progressing   Problem: Nutritional: Goal: Maintenance of adequate nutrition will improve Outcome: Progressing Goal: Progress toward achieving an optimal weight will improve Outcome: Progressing   Problem: Skin Integrity: Goal: Risk for impaired skin integrity will decrease Outcome: Progressing   Problem: Tissue Perfusion: Goal: Adequacy of tissue perfusion will improve Outcome: Progressing   Problem: Education: Goal: Knowledge of General Education information will improve Description: Including pain rating scale, medication(s)/side effects and non-pharmacologic comfort measures Outcome: Progressing   Problem: Health Behavior/Discharge Planning: Goal: Ability to manage health-related needs will improve Outcome:  Progressing   Problem: Clinical Measurements: Goal: Ability to maintain clinical measurements within normal limits will improve Outcome: Progressing Goal: Will remain free from infection Outcome: Progressing Goal: Diagnostic test results will improve Outcome: Progressing Goal: Respiratory complications will improve Outcome: Progressing Goal: Cardiovascular complication will be avoided Outcome: Progressing   Problem: Activity: Goal: Risk for activity intolerance will decrease Outcome: Progressing   Problem: Nutrition: Goal: Adequate nutrition will be maintained Outcome: Progressing   Problem: Coping: Goal: Level of anxiety will decrease Outcome: Progressing   Problem: Elimination: Goal: Will not experience complications related to bowel motility Outcome: Progressing Goal: Will not experience complications related to urinary retention Outcome: Progressing   Problem: Pain Managment: Goal: General experience of comfort will improve and/or be controlled Outcome: Progressing   Problem: Safety: Goal: Ability to remain free from injury will improve Outcome: Progressing   Problem: Skin Integrity: Goal: Risk for impaired skin integrity will decrease Outcome: Progressing   Problem: Education: Goal: Expressions of having a comfortable level of knowledge regarding the disease process will increase Outcome: Progressing   Problem: Coping: Goal: Ability to adjust to condition or change in health will improve Outcome: Progressing Goal: Ability to identify appropriate support needs will improve Outcome: Progressing   Problem: Health Behavior/Discharge Planning: Goal: Compliance with prescribed medication regimen will improve Outcome: Progressing   Problem: Medication: Goal: Risk for medication side effects will decrease Outcome: Progressing   Problem: Clinical Measurements: Goal: Complications related to the disease process, condition or treatment will be avoided or  minimized Outcome: Progressing Goal: Diagnostic test results will improve Outcome: Progressing   Problem: Safety: Goal: Verbalization of understanding the information provided will improve Outcome: Progressing   Problem: Self-Concept: Goal: Level of anxiety will decrease Outcome: Progressing Goal: Ability to verbalize feelings about condition will improve Outcome: Progressing

## 2023-10-25 NOTE — Progress Notes (Signed)
 PROGRESS NOTE    [name]  FMW:[id number] DOB: 01/11/1966 DOA: 10/22/2023 PCP: Edman Marsa PARAS, DO     Brief Narrative:   From admission h and p  : [name] is a pleasant 58 y.o. female with medical history significant for recurrent stroke with chronic residual right-sided weakness, HTN, HLD, PVD, DM, who presented to ED at [facility] for an episode of seizure.  Patient was recently admitted and discharged from [facility] between 10/19/2023 and 10/20/2023 after being treated for a splenic infarct.  Patient was discharged home on Eliquis and held Plavix  when patient was on Eliquis. Per EMS, husband at home witnessed seizure lasting approximately 1 minute, postictal confusion, no history of seizure in the past.  Patient has a history of stroke with right-sided deficits.  Recently admitted to the hospital for a splenic lesion.  No evidence of injury to tongue or incontinence.  Patient oriented to self but not to place or situation.  EMS vitals blood glucose 159, 137/75, 98.5 F axillary.   Patient was sedated with Ativan  in the night.  She was not able to provide single history with me.  I did speak to patient's husband who said patient was eating dinner with husband and suddenly she had a seizure-like activities as mentioned above.  Denies any fever, chills, nausea, vomiting, dysuria, abdominal pain prior to that.  No history of trauma to head.  Assessment & Plan:   Principal Problem:   Seizure (HCC) Active Problems:   PVD (peripheral vascular disease)   Type 2 diabetes mellitus with peripheral vascular disease (HCC)   History of cerebrovascular accident (CVA) with residual deficit   HTN (hypertension)   COPD (chronic obstructive pulmonary disease) (HCC)   GERD (gastroesophageal reflux disease)   Tobacco abuse   Obesity (BMI 30-39.9)   Chronic hypoxemic respiratory failure (HCC)   Hemiparesis of right dominant side as late effect of cerebrovascular disease (HCC)   Splenic  infarction  # Seizure Prior strokes likely the primary driver, possible infection may have precipitated. MRI brain nothing acute. Post-ictal confusion resolved. EEG no ongoing seizure activity - neuro following - continue keppra - seizure precautions  # CAP # Fever No fever last 24. Meningitis eval negative. Covid/flu/rsv neg. No GI symptoms. Was recently diagnosed w/ splenic infarct which can be infectious. UA not suggestive of infection. CT shows signs atypical lung infection. Viral panels neg. Full body skin exam today no signs active infection. - follow blood cultures, ngtd - cont ceftriaxone /azithromycin   # Left upper quadrant pain New today. Improving. CT shows significant food burden in stomach, otherwise unremarkable. Patient is tolerating diet, passing flatus, last bm 2 days ago which is normal for her. Reviewed with GI, this isn't a particularly concerning finding. - will monitor overnight  # Recent splenic infarct ruled out Radiologist makes clear that splenic lesion is chronic and is not an infarct - d/c anticoagulation  # History stroke # right-sided hemiplegia MRI stable - home statin  # COPD Quiescent - breztri  for home trelegy  # T2DM Euglycemic - ssi  # MDD - home celexa   # Chronic pain - home gabapentin , resume at half dose 300 q6 - home oxy  # Obesity noted  DVT prophylaxis: lovenox  Code Status: dnr/dni Family Communication: husband updated telephonically 10/11  Level of care: Progressive Status is: Inpatient Remains inpatient appropriate because: monitoring overnight as above    Consultants:  neurology  Procedures: LP  Antimicrobials:  Ceftriaxone /azithromycin    Subjective: Left upper quadrant pain  that has improved some but still there  Objective: Vitals:   10/24/23 2352 10/25/23 0344 10/25/23 0814 10/25/23 1322  BP: 107/61 98/65 123/68 127/68  Pulse: 78 80 77 77  Resp: 17 18 17    Temp: 98.5 F (36.9 C) 98.1 F (36.7  C) 98 F (36.7 C) 98.1 F (36.7 C)  TempSrc: Oral Oral    SpO2: 100% 98% 99% 100%  Weight:      Height:        Intake/Output Summary (Last 24 hours) at 10/25/2023 1529 Last data filed at 10/25/2023 1300 Gross per 24 hour  Intake 362.25 ml  Output 700 ml  Net -337.75 ml   Filed Weights   10/22/23 2137  Weight: 90 kg    Examination:  General exam: Appears calm and comfortable, chronically ill appearing Respiratory system: Clear to auscultation. Respiratory effort normal. Cardiovascular system: S1 & S2 heard, RRR.   Gastrointestinal system: Abdomen is obese, soft and mildly tender luq Central nervous system: Alert and oriented. Right sided weakness Extremities: warm, no edema Skin: some echymoses to arms Psychiatry: Judgement and insight appear normal. Mood & affect appropriate.     Data Reviewed: I have personally reviewed following labs and imaging studies  CBC: Recent Labs  Lab 10/19/23 0318 10/20/23 0341 10/22/23 2235 10/23/23 0419 10/24/23 0224 10/25/23 0336  WBC 11.4* 8.9 10.8* 13.7* 9.8 8.0  NEUTROABS 8.8*  --  8.6* 9.6*  --   --   HGB 13.7 12.4 14.0 13.2 11.7* 11.7*  HCT 42.0 39.0 42.7 41.0 35.8* 35.3*  MCV 85.9 86.7 85.6 85.8 86.7 85.7  PLT 354 327 340 377 286 255   Basic Metabolic Panel: Recent Labs  Lab 10/19/23 0318 10/20/23 0341 10/22/23 2235 10/23/23 0419 10/23/23 1517 10/24/23 0224 10/25/23 0336  NA 136 133* 136 136 134*  --  137  K 4.1 3.6 3.9 3.7 4.0  --  3.2*  CL 98 100 101 101 101  --  104  CO2 23 25 20* 21* 21*  --  24  GLUCOSE 156* 103* 185* 127* 161*  --  144*  BUN 12 15 10 15 12   --  12  CREATININE 0.55 0.78 0.72 0.66 0.67 0.62 0.54  CALCIUM  9.3 8.5* 9.2 9.0 8.4*  --  8.2*  MG 1.8  --   --   --   --  1.7 1.8   GFR: Estimated Creatinine Clearance: 84.5 mL/min (by C-G formula based on SCr of 0.54 mg/dL). Liver Function Tests: Recent Labs  Lab 10/19/23 0318 10/22/23 2235 10/23/23 0419 10/23/23 1517  AST 19 21 16 18    ALT 11 11 10 11   ALKPHOS 64 60 57 48  BILITOT 0.5 0.6 0.6 1.1  PROT 7.4 7.3 7.3 6.5  ALBUMIN 3.5 3.7 3.6 3.2*   No results for input(s): LIPASE, AMYLASE in the last 168 hours. No results for input(s): AMMONIA in the last 168 hours. Coagulation Profile: Recent Labs  Lab 10/19/23 1021 10/23/23 0419 10/24/23 1450  INR 1.0 1.2 1.1   Cardiac Enzymes: No results for input(s): CKTOTAL, CKMB, CKMBINDEX, TROPONINI in the last 168 hours. BNP (last 3 results) No results for input(s): PROBNP in the last 8760 hours. HbA1C: No results for input(s): HGBA1C in the last 72 hours. CBG: Recent Labs  Lab 10/24/23 0805 10/24/23 1627 10/24/23 2126 10/25/23 0811 10/25/23 1320  GLUCAP 97 128* 174* 131* 149*   Lipid Profile: No results for input(s): CHOL, HDL, LDLCALC, TRIG, CHOLHDL, LDLDIRECT in the last 72 hours.  Thyroid  Function Tests: No results for input(s): TSH, T4TOTAL, FREET4, T3FREE, THYROIDAB in the last 72 hours. Anemia Panel: No results for input(s): VITAMINB12, FOLATE, FERRITIN, TIBC, IRON, RETICCTPCT in the last 72 hours. Urine analysis:    Component Value Date/Time   COLORURINE YELLOW (A) 10/23/2023 0012   APPEARANCEUR CLEAR (A) 10/23/2023 0012   APPEARANCEUR Clear 09/08/2022 1446   LABSPEC 1.032 (H) 10/23/2023 0012   LABSPEC 1.030 09/23/2013 0154   PHURINE 5.0 10/23/2023 0012   GLUCOSEU NEGATIVE 10/23/2023 0012   GLUCOSEU >=500 09/23/2013 0154   HGBUR NEGATIVE 10/23/2023 0012   BILIRUBINUR NEGATIVE 10/23/2023 0012   BILIRUBINUR Negative 09/08/2022 1446   BILIRUBINUR Negative 09/23/2013 0154   KETONESUR 5 (A) 10/23/2023 0012   PROTEINUR 100 (A) 10/23/2023 0012   UROBILINOGEN 0.2 04/21/2022 1445   UROBILINOGEN 0.2 03/11/2012 1234   NITRITE NEGATIVE 10/23/2023 0012   LEUKOCYTESUR NEGATIVE 10/23/2023 0012   LEUKOCYTESUR Negative 09/23/2013 0154   Sepsis Labs: @LABRCNTIP (procalcitonin:4,lacticidven:4)  ) Recent  Results (from the past 240 hours)  Resp panel by RT-PCR (RSV, Flu A&B, Covid) Anterior Nasal Swab     Status: None   Collection Time: 10/19/23  3:45 AM   Specimen: Anterior Nasal Swab  Result Value Ref Range Status   SARS Coronavirus 2 by RT PCR NEGATIVE NEGATIVE Final    Comment: (NOTE) SARS-CoV-2 target nucleic acids are NOT DETECTED.  The SARS-CoV-2 RNA is generally detectable in upper respiratory specimens during the acute phase of infection. The lowest concentration of SARS-CoV-2 viral copies this assay can detect is 138 copies/mL. A negative result does not preclude SARS-Cov-2 infection and should not be used as the sole basis for treatment or other patient management decisions. A negative result may occur with  improper specimen collection/handling, submission of specimen other than nasopharyngeal swab, presence of viral mutation(s) within the areas targeted by this assay, and inadequate number of viral copies(<138 copies/mL). A negative result must be combined with clinical observations, patient history, and epidemiological information. The expected result is Negative.  Fact Sheet for Patients:  BloggerCourse.com  Fact Sheet for Healthcare Providers:  SeriousBroker.it  This test is no t yet approved or cleared by the [place]  FDA and  has been authorized for detection and/or diagnosis of SARS-CoV-2 by FDA under an Emergency Use Authorization (EUA). This EUA will remain  in effect (meaning this test can be used) for the duration of the COVID-19 declaration under Section 564(b)(1) of the Act, 21 U.S.C.section 360bbb-3(b)(1), unless the authorization is terminated  or revoked sooner.       Influenza A by PCR NEGATIVE NEGATIVE Final   Influenza B by PCR NEGATIVE NEGATIVE Final    Comment: (NOTE) The Xpert Xpress SARS-CoV-2/FLU/RSV plus assay is intended as an aid in the diagnosis of influenza from Nasopharyngeal swab  specimens and should not be used as a sole basis for treatment. Nasal washings and aspirates are unacceptable for Xpert Xpress SARS-CoV-2/FLU/RSV testing.  Fact Sheet for Patients: BloggerCourse.com  Fact Sheet for Healthcare Providers: SeriousBroker.it  This test is not yet approved or cleared by the [place]  FDA and has been authorized for detection and/or diagnosis of SARS-CoV-2 by FDA under an Emergency Use Authorization (EUA). This EUA will remain in effect (meaning this test can be used) for the duration of the COVID-19 declaration under Section 564(b)(1) of the Act, 21 U.S.C. section 360bbb-3(b)(1), unless the authorization is terminated or revoked.     Resp Syncytial Virus by PCR NEGATIVE NEGATIVE Final    Comment: (  NOTE) Fact Sheet for Patients: BloggerCourse.com  Fact Sheet for Healthcare Providers: SeriousBroker.it  This test is not yet approved or cleared by the [place]  FDA and has been authorized for detection and/or diagnosis of SARS-CoV-2 by FDA under an Emergency Use Authorization (EUA). This EUA will remain in effect (meaning this test can be used) for the duration of the COVID-19 declaration under Section 564(b)(1) of the Act, 21 U.S.C. section 360bbb-3(b)(1), unless the authorization is terminated or revoked.  Performed at [facility], [address] [place], [place] [address]   Culture, blood (x 2)     Status: None (Preliminary result)   Collection Time: 10/23/23  4:19 AM   Specimen: BLOOD  Result Value Ref Range Status   Specimen Description BLOOD BLOOD RIGHT HAND  Final   Special Requests   Final    BOTTLES DRAWN AEROBIC AND ANAEROBIC Blood Culture results may not be optimal due to an inadequate volume of blood received in culture bottles   Culture   Final    NO GROWTH 2 DAYS Performed at [facility], [address], [place], [place] [address]    Report Status PENDING  Incomplete  Culture, blood (x 2)     Status: None (Preliminary result)   Collection Time: 10/23/23  4:19 AM   Specimen: BLOOD  Result Value Ref Range Status   Specimen Description BLOOD BLOOD LEFT HAND  Final   Special Requests   Final    BOTTLES DRAWN AEROBIC AND ANAEROBIC Blood Culture results may not be optimal due to an inadequate volume of blood received in culture bottles   Culture   Final    NO GROWTH 2 DAYS Performed at [facility], [address], [place], [place] [address]    Report Status PENDING  Incomplete  Resp panel by RT-PCR (RSV, Flu A&B, Covid) Anterior Nasal Swab     Status: None   Collection Time: 10/23/23  8:57 AM   Specimen: Anterior Nasal Swab  Result Value Ref Range Status   SARS Coronavirus 2 by RT PCR NEGATIVE NEGATIVE Final    Comment: (NOTE) SARS-CoV-2 target nucleic acids are NOT DETECTED.  The SARS-CoV-2 RNA is generally detectable in upper respiratory specimens during the acute phase of infection. The lowest concentration of SARS-CoV-2 viral copies this assay can detect is 138 copies/mL. A negative result does not preclude SARS-Cov-2 infection and should not be used as the sole basis for treatment or other patient management decisions. A negative result may occur with  improper specimen collection/handling, submission of specimen other than nasopharyngeal swab, presence of viral mutation(s) within the areas targeted by this assay, and inadequate number of viral copies(<138 copies/mL). A negative result must be combined with clinical observations, patient history, and epidemiological information. The expected result is Negative.  Fact Sheet for Patients:  BloggerCourse.com  Fact Sheet for Healthcare Providers:  SeriousBroker.it  This test is no t yet approved or cleared by the [place]  FDA and  has been authorized for detection  and/or diagnosis of SARS-CoV-2 by FDA under an Emergency Use Authorization (EUA). This EUA will remain  in effect (meaning this test can be used) for the duration of the COVID-19 declaration under Section 564(b)(1) of the Act, 21 U.S.C.section 360bbb-3(b)(1), unless the authorization is terminated  or revoked sooner.       Influenza A by PCR NEGATIVE NEGATIVE Final   Influenza B by PCR NEGATIVE NEGATIVE Final    Comment: (NOTE) The Xpert Xpress SARS-CoV-2/FLU/RSV plus  assay is intended as an aid in the diagnosis of influenza from Nasopharyngeal swab specimens and should not be used as a sole basis for treatment. Nasal washings and aspirates are unacceptable for Xpert Xpress SARS-CoV-2/FLU/RSV testing.  Fact Sheet for Patients: BloggerCourse.com  Fact Sheet for Healthcare Providers: SeriousBroker.it  This test is not yet approved or cleared by the [place]  FDA and has been authorized for detection and/or diagnosis of SARS-CoV-2 by FDA under an Emergency Use Authorization (EUA). This EUA will remain in effect (meaning this test can be used) for the duration of the COVID-19 declaration under Section 564(b)(1) of the Act, 21 U.S.C. section 360bbb-3(b)(1), unless the authorization is terminated or revoked.     Resp Syncytial Virus by PCR NEGATIVE NEGATIVE Final    Comment: (NOTE) Fact Sheet for Patients: BloggerCourse.com  Fact Sheet for Healthcare Providers: SeriousBroker.it  This test is not yet approved or cleared by the [place]  FDA and has been authorized for detection and/or diagnosis of SARS-CoV-2 by FDA under an Emergency Use Authorization (EUA). This EUA will remain in effect (meaning this test can be used) for the duration of the COVID-19 declaration under Section 564(b)(1) of the Act, 21 U.S.C. section 360bbb-3(b)(1), unless the authorization is terminated  or revoked.  Performed at [facility], [address] [place], [place] [address]   Culture, fungus without smear     Status: None (Preliminary result)   Collection Time: 10/23/23  5:31 PM   Specimen: CSF; Other  Result Value Ref Range Status   Specimen Description   Final    CSF Performed at [facility], [address], [place], [place] [address]    Special Requests   Final    Normal Performed at [facility], [address] [place], [place] [address]    Culture   Final    NO FUNGUS ISOLATED AFTER 2 DAYS Performed at [facility], [address], [place], [place] [address]    Report Status PENDING  Incomplete  CSF culture w Gram Stain     Status: None (Preliminary result)   Collection Time: 10/23/23  5:31 PM   Specimen: PATH Cytology CSF; Cerebrospinal Fluid  Result Value Ref Range Status   Specimen Description   Final    CSF Performed at [facility], [address] [place], [place] [address]    Special Requests   Final    NONE Performed at [facility], [address] [place], [place] [address]    Gram Stain   Final    WBC SEEN RED BLOOD CELLS PRESENT NO ORGANISMS SEEN Performed at [facility], [address], [place], [place] [address]    Culture   Final    NO GROWTH 1 DAY Performed at [facility], [address] [place], [place] [address]    Report Status PENDING  Incomplete  Respiratory (~20 pathogens) panel by PCR     Status: None   Collection Time: 10/24/23  8:22 AM   Specimen: Nasopharyngeal Swab; Respiratory  Result Value Ref Range Status   Adenovirus NOT DETECTED NOT DETECTED Final   Coronavirus 229E NOT DETECTED NOT DETECTED Final    Comment: (NOTE) The Coronavirus on the Respiratory Panel, DOES NOT test for the novel  Coronavirus (2019 nCoV)    Coronavirus HKU1 NOT DETECTED NOT DETECTED Final   Coronavirus NL63 NOT DETECTED NOT DETECTED Final   Coronavirus OC43 NOT  DETECTED NOT DETECTED Final   Metapneumovirus NOT  DETECTED NOT DETECTED Final   Rhinovirus / Enterovirus NOT DETECTED NOT DETECTED Final   Influenza A NOT DETECTED NOT DETECTED Final   Influenza B NOT DETECTED NOT DETECTED Final   Parainfluenza Virus 1 NOT DETECTED NOT DETECTED Final   Parainfluenza Virus 2 NOT DETECTED NOT DETECTED Final   Parainfluenza Virus 3 NOT DETECTED NOT DETECTED Final   Parainfluenza Virus 4 NOT DETECTED NOT DETECTED Final   Respiratory Syncytial Virus NOT DETECTED NOT DETECTED Final   Bordetella pertussis NOT DETECTED NOT DETECTED Final   Bordetella Parapertussis NOT DETECTED NOT DETECTED Final   Chlamydophila pneumoniae NOT DETECTED NOT DETECTED Final   Mycoplasma pneumoniae NOT DETECTED NOT DETECTED Final    Comment: Performed at [facility], [address] [place], [place] [address]         Radiology Studies: CT ABDOMEN PELVIS WO CONTRAST Result Date: 10/25/2023 CLINICAL DATA:  Left upper quadrant pain EXAM: CT ABDOMEN AND PELVIS WITHOUT CONTRAST TECHNIQUE: Multidetector CT imaging of the abdomen and pelvis was performed following the standard protocol without IV contrast. RADIATION DOSE REDUCTION: This exam was performed according to the departmental dose-optimization program which includes automated exposure control, adjustment of the mA and/or kV according to patient size and/or use of iterative reconstruction technique. COMPARISON:  CT abdomen and pelvis dated 10/24/2023 FINDINGS: Lower chest: Subsegmental atelectasis in the lingula. No pleural effusion or pneumothorax demonstrated. Partially imaged heart size is normal. Hepatobiliary: No focal hepatic lesions. No intra or extrahepatic biliary ductal dilation. Trace layering hyperattenuation within the gallbladder may represent sludge/stones. Pancreas: No focal lesions or main ductal dilation. Spleen: 1.4 cm hypodensity in the posteroinferior spleen (2:26), previously characterized as a cyst.  Adrenals/Urinary Tract: No adrenal nodules. No suspicious renal mass on this noncontrast enhanced examination , calculi or hydronephrosis. No focal bladder wall thickening. Stomach/Bowel: Distended stomach containing large volume ingested material. No evidence of bowel wall thickening, distention, or inflammatory changes. Colonic diverticulosis without acute diverticulitis. Appendix is not discretely seen. Vascular/Lymphatic: Aortic atherosclerosis. No enlarged abdominal or pelvic lymph nodes. Reproductive: No adnexal masses. Other: No free fluid, fluid collection, or free air. Musculoskeletal: No acute or abnormal lytic or blastic osseous lesions. Postsurgical changes of the anterior abdominal wall. Postsurgical changes of left inguinal region. Multilevel degenerative changes of the partially imaged thoracic and lumbar spine. Postsurgical changes of L4-5 spinal fusion. Hardware appears intact. Unchanged L1 compression deformity. IMPRESSION: 1. No acute abdominopelvic findings. 2. Distended stomach containing large volume ingested material. 3. Colonic diverticulosis without acute diverticulitis. 4. Trace layering hyperattenuation within the gallbladder may represent sludge/stones. 5.  Aortic Atherosclerosis (ICD10-I70.0). Electronically Signed   By: Limin  Xu M.D.   On: 10/25/2023 14:37   ECHOCARDIOGRAM COMPLETE Result Date: 10/24/2023    ECHOCARDIOGRAM REPORT   Patient Name:   KIEANNA ROLLO Valent Date of Exam: 10/24/2023 Medical Rec #:  [id number]      Height:       66.0 in Accession #:    [id number]     Weight:       198.4 lb Date of Birth:  [birth year]       BSA:          1.993 m Patient Age:    58 years       BP:           132/74 mmHg Patient Gender: F              HR:  80 bpm. Exam Location:  [facility] Procedure: 2D Echo, Cardiac Doppler and Color Doppler (Both Spectral and Color            Flow Doppler were utilized during procedure). Indications:     Splenic infarction  History:         Patient has prior  history of Echocardiogram examinations, most                  recent 06/03/2023.  Sonographer:     Thedora Louder RDCS, FASE Referring Phys:  [id number] DEVAUGHN SAYRES TNLX Diagnosing Phys: Darryle Decent MD  Sonographer Comments: Technically difficult study due to poor echo windows, suboptimal parasternal window and suboptimal subcostal window. The patient declined Definity  IV ultrasound imaging agent. IMPRESSIONS  1. Left ventricular ejection fraction, by estimation, is 60 to 65%. The left ventricle has normal function. The left ventricle has no regional wall motion abnormalities. Left ventricular diastolic parameters were normal.  2. Right ventricular systolic function is normal. The right ventricular size is normal. Tricuspid regurgitation signal is inadequate for assessing PA pressure.  3. The mitral valve is grossly normal. Trivial mitral valve regurgitation. No evidence of mitral stenosis.  4. The aortic valve was not well visualized. Aortic valve regurgitation is mild. No aortic stenosis is present. Comparison(s): No significant change from prior study. FINDINGS  Left Ventricle: Left ventricular ejection fraction, by estimation, is 60 to 65%. The left ventricle has normal function. The left ventricle has no regional wall motion abnormalities. The left ventricular internal cavity size was normal in size. There is  no left ventricular hypertrophy. Left ventricular diastolic parameters were normal. Right Ventricle: The right ventricular size is normal. No increase in right ventricular wall thickness. Right ventricular systolic function is normal. Tricuspid regurgitation signal is inadequate for assessing PA pressure. Left Atrium: Left atrial size was normal in size. Right Atrium: Right atrial size was normal in size. Pericardium: There is no evidence of pericardial effusion. Mitral Valve: The mitral valve is grossly normal. Trivial mitral valve regurgitation. No evidence of mitral valve stenosis. Tricuspid Valve: The  tricuspid valve is grossly normal. Tricuspid valve regurgitation is not demonstrated. No evidence of tricuspid stenosis. Aortic Valve: The aortic valve was not well visualized. Aortic valve regurgitation is mild. No aortic stenosis is present. Aortic valve peak gradient measures 14.6 mmHg. Pulmonic Valve: The pulmonic valve was not well visualized. Pulmonic valve regurgitation is not visualized. Aorta: The aortic root is normal in size and structure. Venous: The inferior vena cava was not well visualized. IAS/Shunts: The atrial septum is grossly normal.  LEFT VENTRICLE PLAX 2D LVIDd:         4.30 cm   Diastology LVIDs:         3.00 cm   LV e' medial:    7.07 cm/s LV PW:         1.10 cm   LV E/e' medial:  10.3 LV IVS:        1.00 cm   LV e' lateral:   9.46 cm/s LVOT diam:     1.80 cm   LV E/e' lateral: 7.7 LV SV:         56 LV SV Index:   28 LVOT Area:     2.54 cm  RIGHT VENTRICLE RV Basal diam:  3.30 cm RV S prime:     12.70 cm/s TAPSE (M-mode): 2.0 cm LEFT ATRIUM             Index  RIGHT ATRIUM           Index LA diam:        4.60 cm 2.31 cm/m   RA Area:     13.50 cm LA Vol (A2C):   26.8 ml 13.45 ml/m  RA Volume:   33.70 ml  16.91 ml/m LA Vol (A4C):   44.6 ml 22.38 ml/m LA Biplane Vol: 35.4 ml 17.76 ml/m  AORTIC VALVE AV Area (Vmax): 1.41 cm AV Vmax:        191.00 cm/s AV Peak Grad:   14.6 mmHg LVOT Vmax:      106.00 cm/s LVOT Vmean:     70.000 cm/s LVOT VTI:       0.219 m  AORTA Ao Root diam: 2.50 cm MITRAL VALVE MV Area (PHT): 3.43 cm     SHUNTS MV Decel Time: 221 msec     Systemic VTI:  0.22 m MV E velocity: 72.80 cm/s   Systemic Diam: 1.80 cm MV A velocity: 105.00 cm/s MV E/A ratio:  0.69 Darryle Decent MD Electronically signed by Darryle Decent MD Signature Date/Time: 10/24/2023/1:08:22 PM    Final    CT CHEST ABDOMEN PELVIS W CONTRAST Result Date: 10/24/2023 EXAM: CT CHEST, ABDOMEN AND PELVIS WITH CONTRAST 10/24/2023 08:52:18 AM TECHNIQUE: CT of the chest, abdomen and pelvis was performed  with the administration of 100 mL of iohexol  (OMNIPAQUE ) 300 MG/ML solution. Multiplanar reformatted images are provided for review. Automated exposure control, iterative reconstruction, and/or weight based adjustment of the mA/kV was utilized to reduce the radiation dose to as low as reasonably achievable. COMPARISON: CTA chest 08/22/2023. CT 08/19/2023. CLINICAL HISTORY: 58 year old female. Recent splenic infarct, new fever source unknown. FINDINGS: CHEST: MEDIASTINUM AND LYMPH NODES: Heart size remains normal. No pericardial effusion. Calcified coronary artery and aortic atherosclerosis. The central airways are clear. No mediastinal, hilar or axillary lymphadenopathy. LUNGS AND PLEURA: Diffuse tree in bud nodular opacity appears to be new or increased from the CT in August. Dependent right lower lobe and lung base opacity has regressed. Patchy residual opacity now in the right costophrenic angle most resembles atelectasis. Right middle lobe lateral segment patchy and confluent opacity also mildly regressed and appears to be benign such as postinflammatory or scarring. Mild platelike atelectasis in the posterior basal segment of the left lower lobe now. No pleural effusion or pneumothorax. ABDOMEN AND PELVIS: LIVER: The liver is unremarkable. GALLBLADDER AND BILE DUCTS: Gallbladder is unremarkable. No biliary ductal dilatation. SPLEEN: Small round inferior pole splenic hypodense lesion has been present since CT in November of 2023 and is inconsistent with infarct. There is no acute or suspicious splenic finding. PANCREAS: No acute abnormality. ADRENAL GLANDS: No acute abnormality. KIDNEYS, URETERS AND BLADDER: Right renal midpole cortical scarring is chronic but is new since 2023, stable recently. No acute renal finding. Symmetric renal enhancement and contrast excretion to diminutive ureters. Diminutive bladder. No stones in the kidneys or ureters. No hydronephrosis. No perinephric or periureteral stranding. GI  AND BOWEL: Diminutive stomach. Redundant large bowel. Diminutive or absent appendix. No bowel inflammation. There is no bowel obstruction. REPRODUCTIVE ORGANS: No acute abnormality. PERITONEUM AND RETROPERITONEUM: No ascites. No free air. No pneumoperitoneum or free fluid. VASCULATURE: Aorta is normal in caliber. Left superficial femoral artery vascular stent again noted. Aortic, iliac, and femoral artery atherosclerosis but the major arterial structures in the abdomen and pelvis appear to remain patent. Portal venous system appears patent. ABDOMINAL AND PELVIS LYMPH NODES: No lymphadenopathy. BONES AND SOFT TISSUES: L1  superior endplate compression fracture was partially visible in May this year with loss of height and mild retropulsion of bone unchanged from the incident of CT earlier this month. Chronic L4-L5 decompression and fusion appears stable. Incidental T12 spina bifida occulta, normal variant. No acute osseous abnormality identified in the thorax. Chronic postoperative changes overlying the left common femoral neurovascular bundle are stable. Chronic paraumbilical ventral abdominal hernia repair is stable. No herniated bowel. No focal soft tissue abnormality. IMPRESSION: 1. New or increased diffuse tree-in-bud nodular opacities, strongly suggesting acute viral / atypical respiratory infection. Areas of right lung opacity in August have regressed. No consolidation or pleural effusion now. 2. Small hypodense splenic lesion has been present since 2023 and is NOT infarct. No acute or suspicious finding in the spleen. 3. No acute finding in the abdomen or pelvis. Electronically signed by: Helayne Hurst MD 10/24/2023 09:15 AM EDT RP Workstation: [id number]   MR BRAIN WO CONTRAST Result Date: 10/23/2023 EXAM: MR Brain without Intravenous Contrast. CLINICAL HISTORY: Seizure, new-onset, no history of trauma. Pt to ED via EMS from home c/o seizures tonight. Per EMS husband at home witnessed seizures lasting  approx 1 min, post ictal, no hx of seizures per husband. Pt has hx of stroke with right side deficits. TECHNIQUE: Magnetic resonance images of the brain without intravenous contrast in multiple planes. CONTRAST: Without. COMPARISON: 10/22/2023 CT head. MRI head 06/03/2023. FINDINGS: BRAIN: Motion limited. Left PCA territory infarct. Remote left basal ganglia infarct. Remote left cerebellar lacunar infarcts. Moderate T2 FLAIR hyperintensities in the white matter, compatible with chronic microvascular ischemic change. No restricted diffusion to indicate acute infarction. No intracranial mass or hemorrhage. No midline shift or extra-axial fluid collection. The central arterial and venous flow voids are patent. VENTRICLES: No hydrocephalus. ORBITS: The orbits are normal. SINUSES AND MASTOIDS: The sinuses and mastoid air cells are clear. BONES: No acute fracture or focal osseous lesion. IMPRESSION: 1. No evidence of acute intracranial abnormality. 2. Remote left PCA territory, left basal ganglia, and cerebellar infarcts. Electronically signed by: Gilmore Molt MD 10/23/2023 10:55 PM EDT RP Workstation: [id number]   EEG adult Result Date: 10/23/2023 Michaela Aisha SQUIBB, MD     10/23/2023 10:00 PM History: 58 yo F being evaluated for seizures EEG Duration: 22 minutes Sedation: none Patient State: Awake and asleep Technique: This EEG was acquired with electrodes placed according to the International 10-20 electrode system (including Fp1, Fp2, F3, F4, C3, C4, P3, P4, O1, O2, T3, T4, T5, T6, A1, A2, Fz, Cz, Pz). The following electrodes were missing or displaced: none. Background: There is a posterior dominant rhythm of 8.5 Hz thatis intermixed with prominent irregular generalized delta and theta range activity. There is no clear epileptiform discharges seen. Sleep is briefly recorded with normal appearing structures. Photic stimulation: Physiologic driving is not performed EEG Abnormalities: 1) generalized irregular  slow activity. Clinical Interpretation: This eeg is consistent with a generalized non-specific cerebral dysfunction(encephalopathy). There was no seizure or seizure predisposition recorded on this study. Please note that lack of epileptiform activity on EEG does not preclude the possibility of epilepsy. Aisha Michaela, MD [facility] If 7pm- 7am, please page neurology on call as listed in AMION.  DG FL GUIDED LUMBAR PUNCTURE Result Date: 10/23/2023 CLINICAL DATA:  Provided history: Encephalopathy. Request received for fluoroscopically-guided diagnostic lumbar puncture. EXAM: LUMBAR PUNCTURE UNDER FLUOROSCOPY FLUOROSCOPY: Radiation Exposure Index (as provided by the fluoroscopic device): 18.40 mGy Kerma PROCEDURE: The patient was positioned prone on the fluoroscopy table. An appropriate skin entry site  was determined under fluoroscopy and marked. The operator donned sterile gloves and a mask. The lower back was prepped and draped in the usual sterile fashion. Local anesthesia was provided with 1% lidocaine . Under intermittent fluoroscopy, lumbar puncture was performed at the L5-S1 level using a 20 gauge spinal needle with return of clear/colorless CSF and an opening pressure of 12 cm water . 17 mL of CSF were collected for laboratory studies. The inner stylet was replaced within the needle and the needle was removed in its entirety. A dressing was applied at the skin entry site. The patient tolerated the procedure well and no immediate post-procedure complication was apparent. The procedure was performed by Sherrilee Bal PA-C, supervised by Dr. Rockey Childs. IMPRESSION: 1. Fluoroscopically-guided L5-S1 lumbar puncture. 2. Opening pressure: 12 cm water . 3. 17 mL of CSF collected and sent for laboratory studies. 4. No immediate post-procedure complication. Electronically Signed   By: Rockey Childs D.O.   On: 10/23/2023 18:37        Scheduled Meds:  atorvastatin   40 mg Oral QHS    azithromycin   500 mg Oral Daily   budesonide -glycopyrrolate -formoterol   2 puff Inhalation BID   citalopram   40 mg Oral Daily   enoxaparin  (LOVENOX ) injection  40 mg Subcutaneous Daily   gabapentin   300 mg Oral QID   insulin  aspart  0-15 Units Subcutaneous TID WC   insulin  aspart  0-5 Units Subcutaneous QHS   levETIRAcetam  500 mg Intravenous Q12H   pantoprazole   40 mg Oral Daily   Continuous Infusions:  cefTRIAXone  (ROCEPHIN )  IV 1 g (10/25/23 1125)     LOS: 2 days     Devaughn KATHEE Ban, MD [facility]   If 7PM-7AM, please contact night-coverage [email or web address] Password [email or web address] 10/25/2023, 3:29 PM

## 2023-10-26 ENCOUNTER — Other Ambulatory Visit: Payer: Self-pay

## 2023-10-26 DIAGNOSIS — R569 Unspecified convulsions: Secondary | ICD-10-CM | POA: Diagnosis not present

## 2023-10-26 LAB — CBC
HCT: 36.1 % (ref 36.0–46.0)
Hemoglobin: 11.8 g/dL — ABNORMAL LOW (ref 12.0–15.0)
MCH: 28.1 pg (ref 26.0–34.0)
MCHC: 32.7 g/dL (ref 30.0–36.0)
MCV: 86 fL (ref 80.0–100.0)
Platelets: 271 K/uL (ref 150–400)
RBC: 4.2 MIL/uL (ref 3.87–5.11)
RDW: 14.5 % (ref 11.5–15.5)
WBC: 6.9 K/uL (ref 4.0–10.5)
nRBC: 0 % (ref 0.0–0.2)

## 2023-10-26 LAB — GLUCOSE, CAPILLARY
Glucose-Capillary: 133 mg/dL — ABNORMAL HIGH (ref 70–99)
Glucose-Capillary: 161 mg/dL — ABNORMAL HIGH (ref 70–99)

## 2023-10-26 LAB — COMPREHENSIVE METABOLIC PANEL WITH GFR
ALT: 7 U/L (ref 0–44)
AST: 12 U/L — ABNORMAL LOW (ref 15–41)
Albumin: 3.2 g/dL — ABNORMAL LOW (ref 3.5–5.0)
Alkaline Phosphatase: 47 U/L (ref 38–126)
Anion gap: 8 (ref 5–15)
BUN: 9 mg/dL (ref 6–20)
CO2: 24 mmol/L (ref 22–32)
Calcium: 8.3 mg/dL — ABNORMAL LOW (ref 8.9–10.3)
Chloride: 105 mmol/L (ref 98–111)
Creatinine, Ser: 0.4 mg/dL — ABNORMAL LOW (ref 0.44–1.00)
GFR, Estimated: >60 mL/min (ref >60)
Glucose, Bld: 131 mg/dL — ABNORMAL HIGH (ref 70–99)
Potassium: 3.6 mmol/L (ref 3.5–5.1)
Sodium: 137 mmol/L (ref 135–145)
Total Bilirubin: 0.4 mg/dL (ref 0.0–1.2)
Total Protein: 6.4 g/dL — ABNORMAL LOW (ref 6.5–8.1)

## 2023-10-26 MED ORDER — LEVETIRACETAM 500 MG PO TABS
500.0000 mg | ORAL_TABLET | Freq: Two times a day (BID) | ORAL | 1 refills | Status: DC
  Filled 2023-10-26: qty 180, 90d supply, fill #0

## 2023-10-26 MED ORDER — CEFPODOXIME PROXETIL 200 MG PO TABS
200.0000 mg | ORAL_TABLET | Freq: Two times a day (BID) | ORAL | 0 refills | Status: DC
  Filled 2023-10-26: qty 2, 1d supply, fill #0

## 2023-10-26 MED ORDER — LEVETIRACETAM 500 MG PO TABS: 500.0000 mg | ORAL_TABLET | Freq: Two times a day (BID) | ORAL | 1 refills | Status: DC

## 2023-10-26 MED ORDER — SODIUM CHLORIDE 0.9 % IV SOLN: 500.0000 mg | Freq: Two times a day (BID) | INTRAVENOUS | 1 refills | Status: DC

## 2023-10-26 MED ORDER — AZITHROMYCIN 250 MG PO TABS
250.0000 mg | ORAL_TABLET | Freq: Once | ORAL | 0 refills | Status: DC
  Filled 2023-10-26: qty 1, 1d supply, fill #0

## 2023-10-26 MED ORDER — CLOPIDOGREL BISULFATE 75 MG PO TABS: 75.0000 mg | ORAL_TABLET | Freq: Every day | ORAL | Status: DC

## 2023-10-26 MED ORDER — CEFPODOXIME PROXETIL 200 MG PO TABS: 200.0000 mg | ORAL_TABLET | Freq: Two times a day (BID) | ORAL | 0 refills | Status: AC

## 2023-10-26 MED ORDER — AZITHROMYCIN 250 MG PO TABS: 250.0000 mg | ORAL_TABLET | Freq: Once | ORAL | 0 refills | Status: AC

## 2023-10-26 NOTE — Plan of Care (Signed)
  Problem: Fluid Volume: Goal: Hemodynamic stability will improve Outcome: Progressing   Problem: Clinical Measurements: Goal: Diagnostic test results will improve Outcome: Progressing Goal: Signs and symptoms of infection will decrease Outcome: Progressing   Problem: Respiratory: Goal: Ability to maintain adequate ventilation will improve Outcome: Progressing   Problem: Education: Goal: Ability to describe self-care measures that may prevent or decrease complications (Diabetes Survival Skills Education) will improve Outcome: Progressing Goal: Individualized Educational Video(s) Outcome: Progressing   Problem: Coping: Goal: Ability to adjust to condition or change in health will improve Outcome: Progressing   Problem: Fluid Volume: Goal: Ability to maintain a balanced intake and output will improve Outcome: Progressing   Problem: Health Behavior/Discharge Planning: Goal: Ability to identify and utilize available resources and services will improve Outcome: Progressing Goal: Ability to manage health-related needs will improve Outcome: Progressing   Problem: Metabolic: Goal: Ability to maintain appropriate glucose levels will improve Outcome: Progressing   Problem: Nutritional: Goal: Maintenance of adequate nutrition will improve Outcome: Progressing Goal: Progress toward achieving an optimal weight will improve Outcome: Progressing   Problem: Skin Integrity: Goal: Risk for impaired skin integrity will decrease Outcome: Progressing   Problem: Tissue Perfusion: Goal: Adequacy of tissue perfusion will improve Outcome: Progressing   Problem: Education: Goal: Knowledge of General Education information will improve Description: Including pain rating scale, medication(s)/side effects and non-pharmacologic comfort measures Outcome: Progressing   Problem: Health Behavior/Discharge Planning: Goal: Ability to manage health-related needs will improve Outcome:  Progressing   Problem: Clinical Measurements: Goal: Ability to maintain clinical measurements within normal limits will improve Outcome: Progressing Goal: Will remain free from infection Outcome: Progressing Goal: Diagnostic test results will improve Outcome: Progressing Goal: Respiratory complications will improve Outcome: Progressing Goal: Cardiovascular complication will be avoided Outcome: Progressing   Problem: Activity: Goal: Risk for activity intolerance will decrease Outcome: Progressing   Problem: Nutrition: Goal: Adequate nutrition will be maintained Outcome: Progressing   Problem: Coping: Goal: Level of anxiety will decrease Outcome: Progressing   Problem: Elimination: Goal: Will not experience complications related to bowel motility Outcome: Progressing Goal: Will not experience complications related to urinary retention Outcome: Progressing   Problem: Pain Managment: Goal: General experience of comfort will improve and/or be controlled Outcome: Progressing   Problem: Safety: Goal: Ability to remain free from injury will improve Outcome: Progressing   Problem: Skin Integrity: Goal: Risk for impaired skin integrity will decrease Outcome: Progressing   Problem: Education: Goal: Expressions of having a comfortable level of knowledge regarding the disease process will increase Outcome: Progressing   Problem: Coping: Goal: Ability to adjust to condition or change in health will improve Outcome: Progressing Goal: Ability to identify appropriate support needs will improve Outcome: Progressing   Problem: Health Behavior/Discharge Planning: Goal: Compliance with prescribed medication regimen will improve Outcome: Progressing   Problem: Medication: Goal: Risk for medication side effects will decrease Outcome: Progressing   Problem: Clinical Measurements: Goal: Complications related to the disease process, condition or treatment will be avoided or  minimized Outcome: Progressing Goal: Diagnostic test results will improve Outcome: Progressing   Problem: Safety: Goal: Verbalization of understanding the information provided will improve Outcome: Progressing   Problem: Self-Concept: Goal: Level of anxiety will decrease Outcome: Progressing Goal: Ability to verbalize feelings about condition will improve Outcome: Progressing

## 2023-10-26 NOTE — Discharge Summary (Signed)
 [name] FMW:[id number] DOB: [birth year] DOA: 10/22/2023  PCP: Edman Marsa PARAS, DO  Admit date: 10/22/2023 Discharge date: 10/26/2023  Time spent: 35 minutes  Recommendations for Outpatient Follow-up:  Pcp f/u tomorrow as scheduled Neurology f/u (referred to [facility] neurology)     Discharge Diagnoses:  Principal Problem:   Seizure [facility]) Active Problems:   PVD (peripheral vascular disease)   Type 2 diabetes mellitus with peripheral vascular disease (HCC)   History of cerebrovascular accident (CVA) with residual deficit   HTN (hypertension)   COPD (chronic obstructive pulmonary disease) (HCC)   GERD (gastroesophageal reflux disease)   Tobacco abuse   Obesity (BMI 30-39.9)   Chronic hypoxemic respiratory failure (HCC)   Hemiparesis of right dominant side as late effect of cerebrovascular disease (HCC)   Splenic infarction   Discharge Condition: improved  Diet recommendation: heart healthy  Filed Weights   10/22/23 2137  Weight: 90 kg    History of present illness:  From admission h and p [name] is a pleasant 58 y.o. female with medical history significant for recurrent stroke with chronic residual right-sided weakness, HTN, HLD, PVD, DM, who presented to ED at [facility] for an episode of seizure.  Patient was recently admitted and discharged from [facility] between 10/19/2023 and 10/20/2023 after being treated for a splenic infarct.  Patient was discharged home on Eliquis and held Plavix  when patient was on Eliquis. Per EMS, husband at home witnessed seizure lasting approximately 1 minute, postictal confusion, no history of seizure in the past.  Patient has a history of stroke with right-sided deficits.  Recently admitted to the hospital for a splenic lesion.  No evidence of injury to tongue or incontinence.  Patient oriented to self but not to place or situation.  EMS vitals blood glucose 159, 137/75, 98.5 F axillary.   Patient was sedated with Ativan  in the night.   She was not able to provide single history with me.  I did speak to patient's husband who said patient was eating dinner with husband and suddenly she had a seizure-like activities as mentioned above.  Denies any fever, chills, nausea, vomiting, dysuria, abdominal pain prior to that.  No history of trauma to head.    Hospital Course:   # Seizure Prior strokes likely the primary driver, possible infection may have precipitated. MRI brain nothing acute. Post-ictal confusion resolved. EEG no ongoing seizure activity - neuro following - continue keppra - outpt neurology referral   # CAP # Fever No fever last 48. Meningitis eval negative. Covid/flu/rsv neg. No GI symptoms. Was recently diagnosed w/ splenic infarct which can be infectious but repeat CT shows no signs infarct or infection. UA not suggestive of infection. CT shows signs atypical lung infection. Viral panels neg. Full body skin exam today no signs active infection. - treated for cap with ceftriaxone /azithromycin , discharged on cephalosporin/azithromycin    # Left upper quadrant pain New 10/12. CT shows significant food burden in stomach, otherwise unremarkable. Patient is tolerating diet, passing flatus, last bm 2 days ago which is normal for her. Reviewed with GI, this isn't a particularly concerning finding. Monitored overnight, today much improved, tolerating diet   # Recent splenic infarct ruled out Radiologist makes clear that splenic lesion is chronic and is not an infarct - d/c anticoagulation   # History stroke # Right-sided hemiplegia MRI stable - home statin, resume home plavix  as discontinuing anticoagulation   # COPD Quiescent   # T2DM Euglycemic  Procedures: none   Consultations: none  Discharge Exam: Vitals:   10/26/23 0329 10/26/23 0727  BP: 117/68 107/88  Pulse: 79 71  Resp: 17 18  Temp: 98.2 F (36.8 C) 98 F (36.7 C)  SpO2: 98% 100%    General exam: Appears calm and comfortable, chronically  ill appearing Respiratory system: Clear to auscultation. Respiratory effort normal. Cardiovascular system: S1 & S2 heard, RRR.   Gastrointestinal system: Abdomen is obese, soft   Central nervous system: Alert and oriented. Right sided weakness Extremities: warm, no edema Skin: some echymoses to arms Psychiatry: Judgement and insight appear normal. Mood & affect appropriate.   Discharge Instructions   Discharge Instructions     Ambulatory referral to Neurology   Complete by: As directed    Diet - low sodium heart healthy   Complete by: As directed    Increase activity slowly   Complete by: As directed       Allergies as of 10/26/2023       Reactions   Tramadol Nausea And Vomiting, Hypertension   Augmentin [amoxicillin-pot Clavulanate] Rash   Tolerated Zosyn  03/26/23        Medication List     STOP taking these medications    apixaban 5 MG Tabs tablet Commonly known as: ELIQUIS   Eliquis DVT/PE Starter Pack Generic drug: Apixaban Starter Pack (10mg  and 5mg )   methocarbamol  500 MG tablet Commonly known as: ROBAXIN    mupirocin  ointment 2 % Commonly known as: BACTROBAN    QUEtiapine  100 MG tablet Commonly known as: SEROQUEL    topiramate  100 MG tablet Commonly known as: TOPAMAX        TAKE these medications    albuterol  108 (90 Base) MCG/ACT inhaler Commonly known as: VENTOLIN  HFA INHALE 2 INHALATIONS BY MOUTH  EVERY 4 HOURS AS NEEDED FOR  WHEEZING OR FOR SHORTNESS OF  BREATH   atorvastatin  40 MG tablet Commonly known as: LIPITOR TAKE 1 TABLET BY MOUTH AT  BEDTIME   azithromycin  250 MG tablet Commonly known as: ZITHROMAX  Take 1 tablet (250 mg total) by mouth once for 1 dose. Take on 10/14   cefpodoxime 200 MG tablet Commonly known as: VANTIN Take 1 tablet (200 mg total) by mouth 2 (two) times daily for 1 day. Start 10/14   citalopram  40 MG tablet Commonly known as: CELEXA  TAKE 1 TABLET BY MOUTH DAILY   clopidogrel  75 MG tablet Commonly known  as: Plavix  Take 1 tablet (75 mg total) by mouth daily.   fluticasone  50 MCG/ACT nasal spray Commonly known as: FLONASE  Place 2 sprays into both nostrils daily. Use for 4-6 weeks then stop and use seasonally or as needed.   gabapentin  600 MG tablet Commonly known as: NEURONTIN  TAKE 1 TABLET BY MOUTH 4 TIMES  DAILY   leptospermum manuka honey gel Apply 1 Application topically daily.   levETIRAcetam 500 mg in sodium chloride  0.9 % 100 mL Inject 500 mg into the vein every 12 (twelve) hours.   midodrine  5 MG tablet Commonly known as: PROAMATINE  Take 1 tablet (5 mg total) by mouth 3 (three) times daily with meals.   Mounjaro  10 MG/0.5ML Pen Generic drug: tirzepatide  INJECT THE CONTENTS OF ONE PEN  SUBCUTANEOUSLY WEEKLY AS  DIRECTED   naloxone  4 MG/0.1ML Liqd nasal spray kit Commonly known as: NARCAN  Place 1 spray into the nose once.   nicotine  14 mg/24hr patch Commonly known as: NICODERM CQ  - dosed in mg/24 hours Place onto the skin.   nystatin  powder Commonly known as: MYCOSTATIN /NYSTOP  Apply 1 Application topically 3 (three) times daily.  ondansetron  4 MG disintegrating tablet Commonly known as: ZOFRAN -ODT DISSOLVE 1 TABLET ON THE TONGUE EVERY 8 HOURS AS NEEDED FOR NAUSEA AND VOMITING   oxyCODONE -acetaminophen  10-325 MG tablet Commonly known as: PERCOCET Take 1 tablet by mouth every 6 (six) hours as needed for pain.   pantoprazole  40 MG tablet Commonly known as: PROTONIX  TAKE 1 TABLET BY MOUTH DAILY AT  9 AM   rizatriptan  10 MG disintegrating tablet Commonly known as: MAXALT -MLT Take 1 tablet (10 mg total) by mouth as needed. May repeat in 2 hours if needed   Trelegy Ellipta  100-62.5-25 MCG/ACT Aepb Generic drug: Fluticasone -Umeclidin-Vilant USE 1 INHALATION BY MOUTH ONCE  DAILY AT THE SAME TIME EACH DAY   Vitamin D  (Ergocalciferol ) 1.25 MG (50000 UNIT) Caps capsule Commonly known as: DRISDOL  TAKE 1 CAPSULE BY MOUTH EVERY WEDNESDAY @9AM        Allergies   Allergen Reactions   Tramadol Nausea And Vomiting and Hypertension   Augmentin [Amoxicillin-Pot Clavulanate] Rash    Tolerated Zosyn  03/26/23    Follow-up Information     Edman Marsa PARAS, DO Follow up.   Specialty: Family Medicine Why: tomorrow as scheduled Contact information: [address] [place] [address] [phone]                  The results of significant diagnostics from this hospitalization (including imaging, microbiology, ancillary and laboratory) are listed below for reference.    Significant Diagnostic Studies: CT ABDOMEN PELVIS WO CONTRAST Result Date: 10/25/2023 CLINICAL DATA:  Left upper quadrant pain EXAM: CT ABDOMEN AND PELVIS WITHOUT CONTRAST TECHNIQUE: Multidetector CT imaging of the abdomen and pelvis was performed following the standard protocol without IV contrast. RADIATION DOSE REDUCTION: This exam was performed according to the departmental dose-optimization program which includes automated exposure control, adjustment of the mA and/or kV according to patient size and/or use of iterative reconstruction technique. COMPARISON:  CT abdomen and pelvis dated 10/24/2023 FINDINGS: Lower chest: Subsegmental atelectasis in the lingula. No pleural effusion or pneumothorax demonstrated. Partially imaged heart size is normal. Hepatobiliary: No focal hepatic lesions. No intra or extrahepatic biliary ductal dilation. Trace layering hyperattenuation within the gallbladder may represent sludge/stones. Pancreas: No focal lesions or main ductal dilation. Spleen: 1.4 cm hypodensity in the posteroinferior spleen (2:26), previously characterized as a cyst. Adrenals/Urinary Tract: No adrenal nodules. No suspicious renal mass on this noncontrast enhanced examination , calculi or hydronephrosis. No focal bladder wall thickening. Stomach/Bowel: Distended stomach containing large volume ingested material. No evidence of bowel wall thickening, distention, or inflammatory  changes. Colonic diverticulosis without acute diverticulitis. Appendix is not discretely seen. Vascular/Lymphatic: Aortic atherosclerosis. No enlarged abdominal or pelvic lymph nodes. Reproductive: No adnexal masses. Other: No free fluid, fluid collection, or free air. Musculoskeletal: No acute or abnormal lytic or blastic osseous lesions. Postsurgical changes of the anterior abdominal wall. Postsurgical changes of left inguinal region. Multilevel degenerative changes of the partially imaged thoracic and lumbar spine. Postsurgical changes of L4-5 spinal fusion. Hardware appears intact. Unchanged L1 compression deformity. IMPRESSION: 1. No acute abdominopelvic findings. 2. Distended stomach containing large volume ingested material. 3. Colonic diverticulosis without acute diverticulitis. 4. Trace layering hyperattenuation within the gallbladder may represent sludge/stones. 5.  Aortic Atherosclerosis (ICD10-I70.0). Electronically Signed   By: Limin  Xu M.D.   On: 10/25/2023 14:37   ECHOCARDIOGRAM COMPLETE Result Date: 10/24/2023    ECHOCARDIOGRAM REPORT   Patient Name:   AYDIA MAJ Parish Date of Exam: 10/24/2023 Medical Rec #:  [id number]  Height:       66.0 in Accession #:    [id number]     Weight:       198.4 lb Date of Birth:  03/22/1965       BSA:          1.993 m Patient Age:    58 years       BP:           132/74 mmHg Patient Gender: F              HR:           80 bpm. Exam Location:  [facility] Procedure: 2D Echo, Cardiac Doppler and Color Doppler (Both Spectral and Color            Flow Doppler were utilized during procedure). Indications:     Splenic infarction  History:         Patient has prior history of Echocardiogram examinations, most                  recent 06/03/2023.  Sonographer:     Thedora Louder RDCS, FASE Referring Phys:  [id number] DEVAUGHN SAYRES TNLX Diagnosing Phys: Darryle Decent MD  Sonographer Comments: Technically difficult study due to poor echo windows, suboptimal parasternal window and  suboptimal subcostal window. The patient declined Definity  IV ultrasound imaging agent. IMPRESSIONS  1. Left ventricular ejection fraction, by estimation, is 60 to 65%. The left ventricle has normal function. The left ventricle has no regional wall motion abnormalities. Left ventricular diastolic parameters were normal.  2. Right ventricular systolic function is normal. The right ventricular size is normal. Tricuspid regurgitation signal is inadequate for assessing PA pressure.  3. The mitral valve is grossly normal. Trivial mitral valve regurgitation. No evidence of mitral stenosis.  4. The aortic valve was not well visualized. Aortic valve regurgitation is mild. No aortic stenosis is present. Comparison(s): No significant change from prior study. FINDINGS  Left Ventricle: Left ventricular ejection fraction, by estimation, is 60 to 65%. The left ventricle has normal function. The left ventricle has no regional wall motion abnormalities. The left ventricular internal cavity size was normal in size. There is  no left ventricular hypertrophy. Left ventricular diastolic parameters were normal. Right Ventricle: The right ventricular size is normal. No increase in right ventricular wall thickness. Right ventricular systolic function is normal. Tricuspid regurgitation signal is inadequate for assessing PA pressure. Left Atrium: Left atrial size was normal in size. Right Atrium: Right atrial size was normal in size. Pericardium: There is no evidence of pericardial effusion. Mitral Valve: The mitral valve is grossly normal. Trivial mitral valve regurgitation. No evidence of mitral valve stenosis. Tricuspid Valve: The tricuspid valve is grossly normal. Tricuspid valve regurgitation is not demonstrated. No evidence of tricuspid stenosis. Aortic Valve: The aortic valve was not well visualized. Aortic valve regurgitation is mild. No aortic stenosis is present. Aortic valve peak gradient measures 14.6 mmHg. Pulmonic Valve: The  pulmonic valve was not well visualized. Pulmonic valve regurgitation is not visualized. Aorta: The aortic root is normal in size and structure. Venous: The inferior vena cava was not well visualized. IAS/Shunts: The atrial septum is grossly normal.  LEFT VENTRICLE PLAX 2D LVIDd:         4.30 cm   Diastology LVIDs:         3.00 cm   LV e' medial:    7.07 cm/s LV PW:         1.10 cm   LV  E/e' medial:  10.3 LV IVS:        1.00 cm   LV e' lateral:   9.46 cm/s LVOT diam:     1.80 cm   LV E/e' lateral: 7.7 LV SV:         56 LV SV Index:   28 LVOT Area:     2.54 cm  RIGHT VENTRICLE RV Basal diam:  3.30 cm RV S prime:     12.70 cm/s TAPSE (M-mode): 2.0 cm LEFT ATRIUM             Index        RIGHT ATRIUM           Index LA diam:        4.60 cm 2.31 cm/m   RA Area:     13.50 cm LA Vol (A2C):   26.8 ml 13.45 ml/m  RA Volume:   33.70 ml  16.91 ml/m LA Vol (A4C):   44.6 ml 22.38 ml/m LA Biplane Vol: 35.4 ml 17.76 ml/m  AORTIC VALVE AV Area (Vmax): 1.41 cm AV Vmax:        191.00 cm/s AV Peak Grad:   14.6 mmHg LVOT Vmax:      106.00 cm/s LVOT Vmean:     70.000 cm/s LVOT VTI:       0.219 m  AORTA Ao Root diam: 2.50 cm MITRAL VALVE MV Area (PHT): 3.43 cm     SHUNTS MV Decel Time: 221 msec     Systemic VTI:  0.22 m MV E velocity: 72.80 cm/s   Systemic Diam: 1.80 cm MV A velocity: 105.00 cm/s MV E/A ratio:  0.69 Darryle Decent MD Electronically signed by Darryle Decent MD Signature Date/Time: 10/24/2023/1:08:22 PM    Final    CT CHEST ABDOMEN PELVIS W CONTRAST Result Date: 10/24/2023 EXAM: CT CHEST, ABDOMEN AND PELVIS WITH CONTRAST 10/24/2023 08:52:18 AM TECHNIQUE: CT of the chest, abdomen and pelvis was performed with the administration of 100 mL of iohexol  (OMNIPAQUE ) 300 MG/ML solution. Multiplanar reformatted images are provided for review. Automated exposure control, iterative reconstruction, and/or weight based adjustment of the mA/kV was utilized to reduce the radiation dose to as low as reasonably achievable.  COMPARISON: CTA chest 08/22/2023. CT 08/19/2023. CLINICAL HISTORY: 58 year old female. Recent splenic infarct, new fever source unknown. FINDINGS: CHEST: MEDIASTINUM AND LYMPH NODES: Heart size remains normal. No pericardial effusion. Calcified coronary artery and aortic atherosclerosis. The central airways are clear. No mediastinal, hilar or axillary lymphadenopathy. LUNGS AND PLEURA: Diffuse tree in bud nodular opacity appears to be new or increased from the CT in August. Dependent right lower lobe and lung base opacity has regressed. Patchy residual opacity now in the right costophrenic angle most resembles atelectasis. Right middle lobe lateral segment patchy and confluent opacity also mildly regressed and appears to be benign such as postinflammatory or scarring. Mild platelike atelectasis in the posterior basal segment of the left lower lobe now. No pleural effusion or pneumothorax. ABDOMEN AND PELVIS: LIVER: The liver is unremarkable. GALLBLADDER AND BILE DUCTS: Gallbladder is unremarkable. No biliary ductal dilatation. SPLEEN: Small round inferior pole splenic hypodense lesion has been present since CT in November of 2023 and is inconsistent with infarct. There is no acute or suspicious splenic finding. PANCREAS: No acute abnormality. ADRENAL GLANDS: No acute abnormality. KIDNEYS, URETERS AND BLADDER: Right renal midpole cortical scarring is chronic but is new since 2023, stable recently. No acute renal finding. Symmetric renal enhancement and contrast excretion to diminutive  ureters. Diminutive bladder. No stones in the kidneys or ureters. No hydronephrosis. No perinephric or periureteral stranding. GI AND BOWEL: Diminutive stomach. Redundant large bowel. Diminutive or absent appendix. No bowel inflammation. There is no bowel obstruction. REPRODUCTIVE ORGANS: No acute abnormality. PERITONEUM AND RETROPERITONEUM: No ascites. No free air. No pneumoperitoneum or free fluid. VASCULATURE: Aorta is normal in  caliber. Left superficial femoral artery vascular stent again noted. Aortic, iliac, and femoral artery atherosclerosis but the major arterial structures in the abdomen and pelvis appear to remain patent. Portal venous system appears patent. ABDOMINAL AND PELVIS LYMPH NODES: No lymphadenopathy. BONES AND SOFT TISSUES: L1 superior endplate compression fracture was partially visible in May this year with loss of height and mild retropulsion of bone unchanged from the incident of CT earlier this month. Chronic L4-L5 decompression and fusion appears stable. Incidental T12 spina bifida occulta, normal variant. No acute osseous abnormality identified in the thorax. Chronic postoperative changes overlying the left common femoral neurovascular bundle are stable. Chronic paraumbilical ventral abdominal hernia repair is stable. No herniated bowel. No focal soft tissue abnormality. IMPRESSION: 1. New or increased diffuse tree-in-bud nodular opacities, strongly suggesting acute viral / atypical respiratory infection. Areas of right lung opacity in August have regressed. No consolidation or pleural effusion now. 2. Small hypodense splenic lesion has been present since 2023 and is NOT infarct. No acute or suspicious finding in the spleen. 3. No acute finding in the abdomen or pelvis. Electronically signed by: Helayne Hurst MD 10/24/2023 09:15 AM EDT RP Workstation: [id number]   MR BRAIN WO CONTRAST Result Date: 10/23/2023 EXAM: MR Brain without Intravenous Contrast. CLINICAL HISTORY: Seizure, new-onset, no history of trauma. Pt to ED via EMS from home c/o seizures tonight. Per EMS husband at home witnessed seizures lasting approx 1 min, post ictal, no hx of seizures per husband. Pt has hx of stroke with right side deficits. TECHNIQUE: Magnetic resonance images of the brain without intravenous contrast in multiple planes. CONTRAST: Without. COMPARISON: 10/22/2023 CT head. MRI head 06/03/2023. FINDINGS: BRAIN: Motion limited. Left  PCA territory infarct. Remote left basal ganglia infarct. Remote left cerebellar lacunar infarcts. Moderate T2 FLAIR hyperintensities in the white matter, compatible with chronic microvascular ischemic change. No restricted diffusion to indicate acute infarction. No intracranial mass or hemorrhage. No midline shift or extra-axial fluid collection. The central arterial and venous flow voids are patent. VENTRICLES: No hydrocephalus. ORBITS: The orbits are normal. SINUSES AND MASTOIDS: The sinuses and mastoid air cells are clear. BONES: No acute fracture or focal osseous lesion. IMPRESSION: 1. No evidence of acute intracranial abnormality. 2. Remote left PCA territory, left basal ganglia, and cerebellar infarcts. Electronically signed by: Gilmore Molt MD 10/23/2023 10:55 PM EDT RP Workstation: [id number]   EEG adult Result Date: 10/23/2023 Michaela Aisha SQUIBB, MD     10/23/2023 10:00 PM History: 58 yo F being evaluated for seizures EEG Duration: 22 minutes Sedation: none Patient State: Awake and asleep Technique: This EEG was acquired with electrodes placed according to the International 10-20 electrode system (including Fp1, Fp2, F3, F4, C3, C4, P3, P4, O1, O2, T3, T4, T5, T6, A1, A2, Fz, Cz, Pz). The following electrodes were missing or displaced: none. Background: There is a posterior dominant rhythm of 8.5 Hz thatis intermixed with prominent irregular generalized delta and theta range activity. There is no clear epileptiform discharges seen. Sleep is briefly recorded with normal appearing structures. Photic stimulation: Physiologic driving is not performed EEG Abnormalities: 1) generalized irregular slow activity. Clinical Interpretation: This eeg is  consistent with a generalized non-specific cerebral dysfunction(encephalopathy). There was no seizure or seizure predisposition recorded on this study. Please note that lack of epileptiform activity on EEG does not preclude the possibility of epilepsy.  Aisha Seals, MD [facility] If 7pm- 7am, please page neurology on call as listed in AMION.  DG FL GUIDED LUMBAR PUNCTURE Result Date: 10/23/2023 CLINICAL DATA:  Provided history: Encephalopathy. Request received for fluoroscopically-guided diagnostic lumbar puncture. EXAM: LUMBAR PUNCTURE UNDER FLUOROSCOPY FLUOROSCOPY: Radiation Exposure Index (as provided by the fluoroscopic device): 18.40 mGy Kerma PROCEDURE: The patient was positioned prone on the fluoroscopy table. An appropriate skin entry site was determined under fluoroscopy and marked. The operator donned sterile gloves and a mask. The lower back was prepped and draped in the usual sterile fashion. Local anesthesia was provided with 1% lidocaine . Under intermittent fluoroscopy, lumbar puncture was performed at the L5-S1 level using a 20 gauge spinal needle with return of clear/colorless CSF and an opening pressure of 12 cm water . 17 mL of CSF were collected for laboratory studies. The inner stylet was replaced within the needle and the needle was removed in its entirety. A dressing was applied at the skin entry site. The patient tolerated the procedure well and no immediate post-procedure complication was apparent. The procedure was performed by Sherrilee Bal PA-C, supervised by Dr. Rockey Childs. IMPRESSION: 1. Fluoroscopically-guided L5-S1 lumbar puncture. 2. Opening pressure: 12 cm water . 3. 17 mL of CSF collected and sent for laboratory studies. 4. No immediate post-procedure complication. Electronically Signed   By: Rockey Childs D.O.   On: 10/23/2023 18:37   DG Chest Port 1 View Result Date: 10/23/2023 EXAM: 1 VIEW XRAY OF THE CHEST 10/23/2023 04:19:00 AM COMPARISON: Portable chest 10/19/2023 and earlier. CLINICAL HISTORY: 58 year old female. Sepsis. Hx of COPD, diabetes, HTN, and current smoker. FINDINGS: LUNGS AND PLEURA: Mildly lower lung volumes. No pulmonary edema. No pleural effusion. No pneumothorax. When allowing for  portable technique, the lungs are clear. HEART AND MEDIASTINUM: Mediastinal contours remain normal. No acute abnormality of the cardiac silhouette. BONES AND SOFT TISSUES: Chronic cervical ACDF. No acute osseous abnormality. IMPRESSION: 1. Lower lung volumes, no acute cardiopulmonary abnormality. Electronically signed by: Helayne Hurst MD 10/23/2023 04:35 AM EDT RP Workstation: [id number]   CT Head Wo Contrast Result Date: 10/22/2023 CLINICAL DATA:  Fall, seizure on Xarelto; fall pain EXAM: CT HEAD WITHOUT CONTRAST CT CERVICAL SPINE WITHOUT CONTRAST TECHNIQUE: Multidetector CT imaging of the head and cervical spine was performed following the standard protocol without intravenous contrast. Multiplanar CT image reconstructions of the cervical spine were also generated. RADIATION DOSE REDUCTION: This exam was performed according to the departmental dose-optimization program which includes automated exposure control, adjustment of the mA and/or kV according to patient size and/or use of iterative reconstruction technique. COMPARISON:  CT head 06/02/2023 FINDINGS: CT HEAD FINDINGS Brain: Patchy and confluent areas of decreased attenuation are noted throughout the deep and periventricular white matter of the cerebral hemispheres bilaterally, compatible with chronic microvascular ischemic disease. Left basal ganglia, temporal, occipital encephalomalacia. No evidence of large-territorial acute infarction. No parenchymal hemorrhage. No mass lesion. No extra-axial collection. No mass effect or midline shift. No hydrocephalus. Basilar cisterns are patent. Vascular: No hyperdense vessel. Atherosclerotic calcifications are present within the cavernous internal carotid arteries. Skull: No acute fracture or focal lesion. Sinuses/Orbits: Paranasal sinuses and mastoid air cells are clear. The orbits are unremarkable. Other: None. CT CERVICAL SPINE FINDINGS Alignment: Normal. Skull base and vertebrae: C5-C6 anterior cervical  discectomy and  fusion surgical hardware. No acute fracture. No aggressive appearing focal osseous lesion or focal pathologic process. Soft tissues and spinal canal: No prevertebral fluid or swelling. No visible canal hematoma. Upper chest: Unremarkable. Other: None. IMPRESSION: 1. No acute intracranial abnormality. 2. No acute displaced fracture or traumatic listhesis of the cervical spine. Electronically Signed   By: Morgane  Naveau M.D.   On: 10/22/2023 22:44   CT Cervical Spine Wo Contrast Result Date: 10/22/2023 CLINICAL DATA:  Fall, seizure on Xarelto; fall pain EXAM: CT HEAD WITHOUT CONTRAST CT CERVICAL SPINE WITHOUT CONTRAST TECHNIQUE: Multidetector CT imaging of the head and cervical spine was performed following the standard protocol without intravenous contrast. Multiplanar CT image reconstructions of the cervical spine were also generated. RADIATION DOSE REDUCTION: This exam was performed according to the departmental dose-optimization program which includes automated exposure control, adjustment of the mA and/or kV according to patient size and/or use of iterative reconstruction technique. COMPARISON:  CT head 06/02/2023 FINDINGS: CT HEAD FINDINGS Brain: Patchy and confluent areas of decreased attenuation are noted throughout the deep and periventricular white matter of the cerebral hemispheres bilaterally, compatible with chronic microvascular ischemic disease. Left basal ganglia, temporal, occipital encephalomalacia. No evidence of large-territorial acute infarction. No parenchymal hemorrhage. No mass lesion. No extra-axial collection. No mass effect or midline shift. No hydrocephalus. Basilar cisterns are patent. Vascular: No hyperdense vessel. Atherosclerotic calcifications are present within the cavernous internal carotid arteries. Skull: No acute fracture or focal lesion. Sinuses/Orbits: Paranasal sinuses and mastoid air cells are clear. The orbits are unremarkable. Other: None. CT CERVICAL  SPINE FINDINGS Alignment: Normal. Skull base and vertebrae: C5-C6 anterior cervical discectomy and fusion surgical hardware. No acute fracture. No aggressive appearing focal osseous lesion or focal pathologic process. Soft tissues and spinal canal: No prevertebral fluid or swelling. No visible canal hematoma. Upper chest: Unremarkable. Other: None. IMPRESSION: 1. No acute intracranial abnormality. 2. No acute displaced fracture or traumatic listhesis of the cervical spine. Electronically Signed   By: Morgane  Naveau M.D.   On: 10/22/2023 22:44   CT Angio Abd/Pel w/ and/or w/o Result Date: 10/20/2023 EXAM: CTA ABDOMEN AND PELVIS WITHOUT AND WITH CONTRAST 10/20/2023 06:54:52 AM TECHNIQUE: CTA images of the abdomen and pelvis without and with intravenous contrast (100 mL iohexol  (OMNIPAQUE ) 350 MG/ML injection). Three-dimensional MIP/volume rendered formations were performed. Automated exposure control, iterative reconstruction, and/or weight based adjustment of the mA/kV was utilized to reduce the radiation dose to as low as reasonably achievable. COMPARISON: CT abdomen and pelvis from 10/19/2023. CLINICAL HISTORY: Rule out splenic bleed, concern for splenic infarct. FINDINGS: VASCULATURE: AORTA: Aortic atherosclerotic calcification. No acute finding. No abdominal aortic aneurysm. No dissection. CELIAC TRUNK: The celiac artery and its branches are patent including the splenic artery without signs of thrombosis or contrast extravasation. SUPERIOR MESENTERIC ARTERY: The superior mesenteric artery is patent without significant stenosis or signs of dissection. RENAL ARTERIES: No acute finding. No occlusion or significant stenosis. ILIAC ARTERIES: No acute finding. No occlusion or significant stenosis. IMA: IMA is patent. LIVER: The liver is unremarkable. GALLBLADDER AND BILE DUCTS: Hyperdense material in the portion of the gallbladder likely represents biliary excretion of contrast. No biliary ductal dilatation.  SPLEEN: Small, linear wedge-shaped area of hypodensity within the superior aspect of the spleen is stable from previous exam and is compatible with a small infarct measuring approximately 1.6 cm, image 09/20/2023. Unchanged cyst within the inferior spleen measuring 1.3 cm. No signs of perisplenic fluid/hemorrhage. PANCREAS: The pancreas is unremarkable. ADRENAL GLANDS: Bilateral adrenal glands  demonstrate no acute abnormality. KIDNEYS, URETERS AND BLADDER: Right renal cortical scarring is unchanged. No stones in the kidneys or ureters. No hydronephrosis. No perinephric or periureteral stranding. Urinary bladder is unremarkable. GI AND BOWEL: Stomach and duodenal sweep demonstrate no acute abnormality. No abnormal bowel dilatation or inflammation. The appendix is not visualized. There is no bowel obstruction. No abnormal bowel wall thickening or distension. REPRODUCTIVE: Status post hysterectomy. No adnexal mass. PERITONEUM AND RETROPERITONEUM: No free fluid or fluid collections identified within the abdomen or pelvis. No ascites or free air. LUNG BASE: Subpleural area of nodular consolidative change within the lateral right middle lobe measures 1 cm and is only partially visualized. On the previous exam there was a 1.6 cm area of nodular consolidation which is decreased from 2.3 cm on 08/22/2023. LYMPH NODES: No lymphadenopathy. BONES AND SOFT TISSUES: Status post ventral abdominal wall hernia repair with persistent laxity of the midline lower abdominal wall. Signs of previous vascular access within the left inguinal region. No acute abnormality of the bones. No acute soft tissue abnormality. IMPRESSION: 1. 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 No perisplenic fluid or hemorrhage. 2. Celiac, superior mesenteric, renal, and inferior mesenteric arteries are patent without significant stenosis or dissection. 3. Aortic atherosclerotic calcification. 4. Nodular  area of consolidation within the subpleural right middle lobe decreased compared with 08/22/2023. Electronically signed by: Waddell Calk MD 10/20/2023 08:50 AM EDT RP Workstation: [id number]   US  SPLEEN (ABDOMEN LIMITED) Result Date: 10/19/2023 CLINICAL DATA:  Follow-up small splenic infarct or grade 2 laceration. No reported injury. EXAM: ULTRASOUND ABDOMEN LIMITED COMPARISON:  Abdomen and pelvis CT dated 10/19/2023 FINDINGS: The spleen is normal in size, measuring 11.3 x 10.1 x 6.2 cm. The recently suspected 1.4 cm cyst in the inferior aspect of the spleen is not well visualized, with a suggestion of a corresponding 2.7 cm oval, near anechoic, circumscribed area at that location on image number 11/20. This measured 0.0 cm in corresponding dimensions on the recent CT. The superior aspect of the spleen is not well visualized in the region of the previously demonstrated 1.7 cm wedge-shaped area of low density due to shadowing produced by the aerated lung in the lateral costophrenic angle. IMPRESSION: 1. The recently demonstrated cyst in the inferior aspect of the spleen is not well visualized, with a suggestion of a corresponding 2.7 cm minimally complicated cyst at that location sonographically. 2. Due to shadowing produced by the aerated lung in the lateral costophrenic angle, the superior aspect of the spleen is not well visualized in the region of the previously demonstrated 1.7 cm wedge-shaped area of low density. Based on the CT appearance and lack of a history of trauma, this is most likely a small area of infarction. Electronically Signed   By: Elspeth Bathe M.D.   On: 10/19/2023 14:33   CT ABDOMEN PELVIS W CONTRAST Result Date: 10/19/2023 CLINICAL DATA:  Abdominal pain, nausea and vomiting onset tonight. EXAM: CT ABDOMEN AND PELVIS WITH CONTRAST TECHNIQUE: Multidetector CT imaging of the abdomen and pelvis was performed using the standard protocol following bolus administration of intravenous contrast.  RADIATION DOSE REDUCTION: This exam was performed according to the departmental dose-optimization program which includes automated exposure control, adjustment of the mA and/or kV according to patient size and/or use of iterative reconstruction technique. CONTRAST:  OMNIPAQUE  IOHEXOL  300 MG/ML  SOLN COMPARISON:  CTA chest 08/22/2023, portable chest 09/18/2023, CTA abdomen and pelvis 08/20/2022 and CT abdomen and pelvis with  IV contrast 12/08/2021. FINDINGS: Lower chest: Still seen is a pleural-based consolidation laterally in the right middle lobe measuring 1.5 x 1.6 cm on the sixth image. Two months ago this measured 2.3 x 2.1 cm. Lung bases are otherwise clear with COPD changes. The cardiac size is normal. Small hiatal hernia. Hepatobiliary: The liver is 23 cm length, mildly steatotic without mass. The gallbladder and bile ducts are unremarkable. Pancreas: No abnormality. Spleen: 1.4 cm cyst of the lower medial spleen, Hounsfield density is 20. There is new demonstration of a 1.7 cm in length subcapsular hypodensity in the superior pole of the spleen. This is slightly wedge shaped on the coronal reformatting. This would either represent a grade 2 laceration or a splenic infarct. There is no perisplenic free hemorrhage or hematoma if this is a laceration. The rest of the spleen enhance homogeneously.  No splenomegaly. Adrenals/Urinary Tract: There is new wedge-shaped cortical scarring in the dorsal midpole right kidney. There is no mass enhancement in the adrenal glands and both kidneys. There is no urinary stone or obstruction. There is no bladder thickening. Stomach/Bowel: Partially contracted stomach. Normal caliber unopacified small bowel. An appendix is not seen. No dilatation or wall thickening of the large bowel. In Vascular/Lymphatic: Aortic atherosclerosis. No enlarged abdominal or pelvic lymph nodes. Reproductive: Status post hysterectomy. No adnexal masses. Other: Interval new scarring from the  skin surface to the vasculature left groin area with new stenting in the proximal left superficial femoral artery. There is rectus diastasis and outward protrusion with intact hernia repair. No incarcerated hernia is seen. There is no free fluid, free hemorrhage or free air. Musculoskeletal: Old posterior fusion hardware L4-5 with right hemilaminectomy. Multilevel degenerative changes with osteopenia. Since the August studies there is a new moderate upper plate anterior wedge compression fracture of the L1 vertebral body with 50% anterior height loss, negligible posterior height loss and 3 mm posterosuperior retropulsion. There is no adjacent soft tissue thickening, suggesting this is probably subacute. No other new osseous findings. IMPRESSION: 1. New demonstration of a 1.7 cm in length subcapsular hypodensity in the superior pole of the spleen. This would either represent a grade 2 laceration or a splenic infarct. There is no perisplenic free hemorrhage or hematoma if this is a laceration. 2. New moderate upper plate anterior wedge compression fracture of the L1 vertebral body with 50% anterior height loss, 3 mm posterosuperior retropulsion. This is probably subacute given the lack of adjacent reactive soft tissue changes, but it is new from 2 months ago. 3. New wedge-shaped cortical scarring in the dorsal midpole right kidney. 4. Aortic atherosclerosis. 5. COPD. 6. Decreased size of a pleural-based consolidation laterally in the right middle lobe, now 1.5 x 1.6 cm, previously 2.3 x 2.1 cm. 7. Small hiatal hernia. 8. Rectus diastasis and outward protrusion with intact hernia repair over the protruding portion. 9. Interval stenting in the proximal left superficial femoral artery with overlying scarring. Aortic Atherosclerosis (ICD10-I70.0). Electronically Signed   By: Francis Quam M.D.   On: 10/19/2023 05:02   DG Chest 1 View Result Date: 10/19/2023 EXAM: 1 VIEW(S) XRAY OF THE CHEST 10/19/2023 03:51:00 AM  COMPARISON: Radiograph of the chest dated 09/18/2023 and CT of the chest dated 08/22/2023. CLINICAL HISTORY: cp. Pt arrived by ems d/t N/V / dizziness / and minor complaint of CP. Per pt, symptoms began tonight and woke up to N/V. Per ems, pt had stroke x1 month ago with right sided weakness and was dx with pneumonia x6 wks ago.  FINDINGS: LUNGS AND PLEURA: Vague airspace opacity in right lung base likely represents residue from a wedge-shaped area of opacification/consolidation previously seen within the right middle lobe on the CT of the chest performed 08/22/2023. Continued follow up to ensure resolution is suggested. No pulmonary edema. No pleural effusion. No pneumothorax. HEART AND MEDIASTINUM: No acute abnormality of the cardiac and mediastinal silhouettes. BONES AND SOFT TISSUES: Cervical fusion hardware. IMPRESSION: 1. Vague airspace opacity at the right lung base, likely residual from prior right middle lobe consolidation; recommend follow-up to ensure resolution. Electronically signed by: Evalene Coho MD 10/19/2023 04:12 AM EDT RP Workstation: [id number]    Microbiology: Recent Results (from the past 240 hours)  Resp panel by RT-PCR (RSV, Flu A&B, Covid) Anterior Nasal Swab     Status: None   Collection Time: 10/19/23  3:45 AM   Specimen: Anterior Nasal Swab  Result Value Ref Range Status   SARS Coronavirus 2 by RT PCR NEGATIVE NEGATIVE Final    Comment: (NOTE) SARS-CoV-2 target nucleic acids are NOT DETECTED.  The SARS-CoV-2 RNA is generally detectable in upper respiratory specimens during the acute phase of infection. The lowest concentration of SARS-CoV-2 viral copies this assay can detect is 138 copies/mL. A negative result does not preclude SARS-Cov-2 infection and should not be used as the sole basis for treatment or other patient management decisions. A negative result may occur with  improper specimen collection/handling, submission of specimen other than nasopharyngeal  swab, presence of viral mutation(s) within the areas targeted by this assay, and inadequate number of viral copies(<138 copies/mL). A negative result must be combined with clinical observations, patient history, and epidemiological information. The expected result is Negative.  Fact Sheet for Patients:  BloggerCourse.com  Fact Sheet for Healthcare Providers:  SeriousBroker.it  This test is no t yet approved or cleared by the [place]  FDA and  has been authorized for detection and/or diagnosis of SARS-CoV-2 by FDA under an Emergency Use Authorization (EUA). This EUA will remain  in effect (meaning this test can be used) for the duration of the COVID-19 declaration under Section 564(b)(1) of the Act, 21 U.S.C.section 360bbb-3(b)(1), unless the authorization is terminated  or revoked sooner.       Influenza A by PCR NEGATIVE NEGATIVE Final   Influenza B by PCR NEGATIVE NEGATIVE Final    Comment: (NOTE) The Xpert Xpress SARS-CoV-2/FLU/RSV plus assay is intended as an aid in the diagnosis of influenza from Nasopharyngeal swab specimens and should not be used as a sole basis for treatment. Nasal washings and aspirates are unacceptable for Xpert Xpress SARS-CoV-2/FLU/RSV testing.  Fact Sheet for Patients: BloggerCourse.com  Fact Sheet for Healthcare Providers: SeriousBroker.it  This test is not yet approved or cleared by the [place]  FDA and has been authorized for detection and/or diagnosis of SARS-CoV-2 by FDA under an Emergency Use Authorization (EUA). This EUA will remain in effect (meaning this test can be used) for the duration of the COVID-19 declaration under Section 564(b)(1) of the Act, 21 U.S.C. section 360bbb-3(b)(1), unless the authorization is terminated or revoked.     Resp Syncytial Virus by PCR NEGATIVE NEGATIVE Final    Comment: (NOTE) Fact Sheet for  Patients: BloggerCourse.com  Fact Sheet for Healthcare Providers: SeriousBroker.it  This test is not yet approved or cleared by the [place]  FDA and has been authorized for detection and/or diagnosis of SARS-CoV-2 by FDA under an Emergency Use Authorization (EUA). This EUA will remain in effect (meaning this test can be used) for  the duration of the COVID-19 declaration under Section 564(b)(1) of the Act, 21 U.S.C. section 360bbb-3(b)(1), unless the authorization is terminated or revoked.  Performed at [facility], [address] [place], [place] [address]   Culture, blood (x 2)     Status: None (Preliminary result)   Collection Time: 10/23/23  4:19 AM   Specimen: BLOOD  Result Value Ref Range Status   Specimen Description BLOOD BLOOD RIGHT HAND  Final   Special Requests   Final    BOTTLES DRAWN AEROBIC AND ANAEROBIC Blood Culture results may not be optimal due to an inadequate volume of blood received in culture bottles   Culture   Final    NO GROWTH 3 DAYS Performed at [facility], [address], [place], [place] [address]    Report Status PENDING  Incomplete  Culture, blood (x 2)     Status: None (Preliminary result)   Collection Time: 10/23/23  4:19 AM   Specimen: BLOOD  Result Value Ref Range Status   Specimen Description BLOOD BLOOD LEFT HAND  Final   Special Requests   Final    BOTTLES DRAWN AEROBIC AND ANAEROBIC Blood Culture results may not be optimal due to an inadequate volume of blood received in culture bottles   Culture   Final    NO GROWTH 3 DAYS Performed at [facility], [address] [place], [place] [address]    Report Status PENDING  Incomplete  Resp panel by RT-PCR (RSV, Flu A&B, Covid) Anterior Nasal Swab     Status: None   Collection Time: 10/23/23  8:57 AM   Specimen: Anterior Nasal Swab  Result Value Ref Range Status   SARS Coronavirus 2 by RT PCR NEGATIVE  NEGATIVE Final    Comment: (NOTE) SARS-CoV-2 target nucleic acids are NOT DETECTED.  The SARS-CoV-2 RNA is generally detectable in upper respiratory specimens during the acute phase of infection. The lowest concentration of SARS-CoV-2 viral copies this assay can detect is 138 copies/mL. A negative result does not preclude SARS-Cov-2 infection and should not be used as the sole basis for treatment or other patient management decisions. A negative result may occur with  improper specimen collection/handling, submission of specimen other than nasopharyngeal swab, presence of viral mutation(s) within the areas targeted by this assay, and inadequate number of viral copies(<138 copies/mL). A negative result must be combined with clinical observations, patient history, and epidemiological information. The expected result is Negative.  Fact Sheet for Patients:  BloggerCourse.com  Fact Sheet for Healthcare Providers:  SeriousBroker.it  This test is no t yet approved or cleared by the [place]  FDA and  has been authorized for detection and/or diagnosis of SARS-CoV-2 by FDA under an Emergency Use Authorization (EUA). This EUA will remain  in effect (meaning this test can be used) for the duration of the COVID-19 declaration under Section 564(b)(1) of the Act, 21 U.S.C.section 360bbb-3(b)(1), unless the authorization is terminated  or revoked sooner.       Influenza A by PCR NEGATIVE NEGATIVE Final   Influenza B by PCR NEGATIVE NEGATIVE Final    Comment: (NOTE) The Xpert Xpress SARS-CoV-2/FLU/RSV plus assay is intended as an aid in the diagnosis of influenza from Nasopharyngeal swab specimens and should not be used as a sole basis for treatment. Nasal washings and aspirates are unacceptable for Xpert Xpress SARS-CoV-2/FLU/RSV testing.  Fact Sheet for Patients: BloggerCourse.com  Fact Sheet for Healthcare  Providers: SeriousBroker.it  This test is not yet approved or  cleared by the [place]  FDA and has been authorized for detection and/or diagnosis of SARS-CoV-2 by FDA under an Emergency Use Authorization (EUA). This EUA will remain in effect (meaning this test can be used) for the duration of the COVID-19 declaration under Section 564(b)(1) of the Act, 21 U.S.C. section 360bbb-3(b)(1), unless the authorization is terminated or revoked.     Resp Syncytial Virus by PCR NEGATIVE NEGATIVE Final    Comment: (NOTE) Fact Sheet for Patients: BloggerCourse.com  Fact Sheet for Healthcare Providers: SeriousBroker.it  This test is not yet approved or cleared by the [place]  FDA and has been authorized for detection and/or diagnosis of SARS-CoV-2 by FDA under an Emergency Use Authorization (EUA). This EUA will remain in effect (meaning this test can be used) for the duration of the COVID-19 declaration under Section 564(b)(1) of the Act, 21 U.S.C. section 360bbb-3(b)(1), unless the authorization is terminated or revoked.  Performed at [facility], [address] [place], [place] [address]   Culture, fungus without smear     Status: None (Preliminary result)   Collection Time: 10/23/23  5:31 PM   Specimen: CSF; Other  Result Value Ref Range Status   Specimen Description   Final    CSF Performed at [facility], [address] [place], [place] [address]    Special Requests   Final    Normal Performed at [facility], [address] [place], [place] [address]    Culture   Final    NO FUNGUS ISOLATED AFTER 2 DAYS Performed at [facility], [address], [place], [place] [address]    Report Status PENDING  Incomplete  CSF culture w Gram Stain     Status: None (Preliminary result)   Collection Time: 10/23/23  5:31 PM   Specimen: PATH Cytology CSF; Cerebrospinal  Fluid  Result Value Ref Range Status   Specimen Description   Final    CSF Performed at [facility], [address], [place], [place] [address]    Special Requests   Final    NONE Performed at [facility], [address] [place], [place] [address]    Gram Stain   Final    WBC SEEN RED BLOOD CELLS PRESENT NO ORGANISMS SEEN Performed at [facility], [address], [place], [place] [address]    Culture   Final    NO GROWTH 2 DAYS Performed at [facility], [address], [place], [place] [address]    Report Status PENDING  Incomplete  Respiratory (~20 pathogens) panel by PCR     Status: None   Collection Time: 10/24/23  8:22 AM   Specimen: Nasopharyngeal Swab; Respiratory  Result Value Ref Range Status   Adenovirus NOT DETECTED NOT DETECTED Final   Coronavirus 229E NOT DETECTED NOT DETECTED Final    Comment: (NOTE) The Coronavirus on the Respiratory Panel, DOES NOT test for the novel  Coronavirus (2019 nCoV)    Coronavirus HKU1 NOT DETECTED NOT DETECTED Final   Coronavirus NL63 NOT DETECTED NOT DETECTED Final   Coronavirus OC43 NOT DETECTED NOT DETECTED Final   Metapneumovirus NOT DETECTED NOT DETECTED Final   Rhinovirus / Enterovirus NOT DETECTED NOT DETECTED Final   Influenza A NOT DETECTED NOT DETECTED Final   Influenza B NOT DETECTED NOT DETECTED Final   Parainfluenza Virus 1 NOT DETECTED NOT DETECTED Final   Parainfluenza Virus 2 NOT DETECTED NOT DETECTED Final   Parainfluenza Virus 3 NOT DETECTED  NOT DETECTED Final   Parainfluenza Virus 4 NOT DETECTED NOT DETECTED Final   Respiratory Syncytial Virus NOT DETECTED NOT DETECTED Final   Bordetella pertussis NOT DETECTED NOT DETECTED Final   Bordetella Parapertussis NOT DETECTED NOT DETECTED Final   Chlamydophila pneumoniae NOT DETECTED NOT DETECTED Final   Mycoplasma pneumoniae NOT DETECTED NOT DETECTED Final    Comment: Performed at [facility], [address],  [place], [place] [address]     Labs: Basic Metabolic Panel: Recent Labs  Lab 10/22/23 2235 10/23/23 0419 10/23/23 1517 10/24/23 0224 10/25/23 0336 10/26/23 0454  NA 136 136 134*  --  137 137  K 3.9 3.7 4.0  --  3.2* 3.6  CL 101 101 101  --  104 105  CO2 20* 21* 21*  --  24 24  GLUCOSE 185* 127* 161*  --  144* 131*  BUN 10 15 12   --  12 9  CREATININE 0.72 0.66 0.67 0.62 0.54 0.40*  CALCIUM  9.2 9.0 8.4*  --  8.2* 8.3*  MG  --   --   --  1.7 1.8  --    Liver Function Tests: Recent Labs  Lab 10/22/23 2235 10/23/23 0419 10/23/23 1517 10/26/23 0454  AST 21 16 18  12*  ALT 11 10 11 7   ALKPHOS 60 57 48 47  BILITOT 0.6 0.6 1.1 0.4  PROT 7.3 7.3 6.5 6.4*  ALBUMIN 3.7 3.6 3.2* 3.2*   No results for input(s): LIPASE, AMYLASE in the last 168 hours. No results for input(s): AMMONIA in the last 168 hours. CBC: Recent Labs  Lab 10/22/23 2235 10/23/23 0419 10/24/23 0224 10/25/23 0336 10/26/23 0454  WBC 10.8* 13.7* 9.8 8.0 6.9  NEUTROABS 8.6* 9.6*  --   --   --   HGB 14.0 13.2 11.7* 11.7* 11.8*  HCT 42.7 41.0 35.8* 35.3* 36.1  MCV 85.6 85.8 86.7 85.7 86.0  PLT 340 377 286 255 271   Cardiac Enzymes: No results for input(s): CKTOTAL, CKMB, CKMBINDEX, TROPONINI in the last 168 hours. BNP: BNP (last 3 results) No results for input(s): BNP in the last 8760 hours.  ProBNP (last 3 results) No results for input(s): PROBNP in the last 8760 hours.  CBG: Recent Labs  Lab 10/25/23 0811 10/25/23 1320 10/25/23 1621 10/25/23 1946 10/26/23 0816  GLUCAP 131* 149* 152* 162* 133*       Signed:  Devaughn KATHEE Ban MD.  [facility] 10/26/2023, 10:43 AM

## 2023-10-26 NOTE — TOC Transition Note (Signed)
 Transition of Care [facility]) - Discharge Note   Patient Details  Name: [name] MRN: [id number] Date of Birth: 1965/12/12  Transition of [facility]) CM/SW Contact:  Lauraine JAYSON Carpen, LCSW Phone Number: 10/26/2023, 10:58 AM   Clinical Narrative:  Patient has orders to discharge home today. Readmission prevention screen complete. CSW met with patient. No family at bedside. CSW introduced role and explained that discharge planning would be discussed. PCP is Marsa Officer, DO. Husband drives her to appointments. She uses [facility] and [facility] for prescriptions. No issues obtaining medications. Patient lives home with her husband and dogs. She is active with [facility] for PT and OT. Liaison is aware she is discharging home today. Patient has a hoyer lift, BSC, and shower chair at home. No further concerns. Her husband and son will transport her home today. CSW signing off.   Final next level of care: Home w Home Health Services Barriers to Discharge: No Barriers Identified   Patient Goals and CMS Choice            Discharge Placement                Patient to be transferred to facility by: Husband and son   Patient and family notified of of transfer: 10/26/23  Discharge Plan and Services Additional resources added to the After Visit Summary for                            [facility] Arranged: PT, OT HH Agency: [facility] Date [facility] Agency Contacted: 10/26/23   Representative spoke with at [facility] Agency: Leotis  Social Drivers of Health (SDOH) Interventions SDOH Screenings   Food Insecurity: No Food Insecurity (10/24/2023)  Housing: Low Risk  (10/24/2023)  Transportation Needs: No Transportation Needs (10/24/2023)  Utilities: Not At Risk (10/24/2023)  Alcohol  Screen: Low Risk  (05/22/2023)  Depression (PHQ2-9): High Risk (06/23/2023)  Financial Resource Strain: Low Risk  (05/22/2023)  Physical Activity: Inactive (05/22/2023)  Social Connections:  Moderately Isolated (06/02/2023)  Stress: No Stress Concern Present (05/22/2023)  Tobacco Use: High Risk (10/22/2023)  Health Literacy: Adequate Health Literacy (05/22/2023)     Readmission Risk Interventions    10/26/2023   10:57 AM  Readmission Risk Prevention Plan  Transportation Screening Complete  PCP or Specialist Appt within 3-5 Days Complete  HRI or Home Care Consult Complete  Social Work Consult for Recovery Care Planning/Counseling Complete  Palliative Care Screening Not Applicable  Medication Review Ocean[facility]) Complete

## 2023-10-27 ENCOUNTER — Inpatient Hospital Stay: Admitting: Family Medicine

## 2023-10-27 ENCOUNTER — Other Ambulatory Visit: Payer: Self-pay

## 2023-10-27 ENCOUNTER — Telehealth: Payer: Self-pay

## 2023-10-27 DIAGNOSIS — R911 Solitary pulmonary nodule: Secondary | ICD-10-CM | POA: Diagnosis not present

## 2023-10-27 DIAGNOSIS — K760 Fatty (change of) liver, not elsewhere classified: Secondary | ICD-10-CM | POA: Diagnosis not present

## 2023-10-27 DIAGNOSIS — G894 Chronic pain syndrome: Secondary | ICD-10-CM | POA: Diagnosis not present

## 2023-10-27 DIAGNOSIS — Z7902 Long term (current) use of antithrombotics/antiplatelets: Secondary | ICD-10-CM | POA: Diagnosis not present

## 2023-10-27 DIAGNOSIS — I10 Essential (primary) hypertension: Secondary | ICD-10-CM | POA: Diagnosis not present

## 2023-10-27 DIAGNOSIS — I951 Orthostatic hypotension: Secondary | ICD-10-CM | POA: Diagnosis not present

## 2023-10-27 DIAGNOSIS — F17210 Nicotine dependence, cigarettes, uncomplicated: Secondary | ICD-10-CM | POA: Diagnosis not present

## 2023-10-27 DIAGNOSIS — J449 Chronic obstructive pulmonary disease, unspecified: Secondary | ICD-10-CM | POA: Diagnosis not present

## 2023-10-27 DIAGNOSIS — Z7982 Long term (current) use of aspirin: Secondary | ICD-10-CM | POA: Diagnosis not present

## 2023-10-27 DIAGNOSIS — I69351 Hemiplegia and hemiparesis following cerebral infarction affecting right dominant side: Secondary | ICD-10-CM | POA: Diagnosis not present

## 2023-10-27 DIAGNOSIS — D735 Infarction of spleen: Secondary | ICD-10-CM | POA: Diagnosis not present

## 2023-10-27 DIAGNOSIS — K219 Gastro-esophageal reflux disease without esophagitis: Secondary | ICD-10-CM | POA: Diagnosis not present

## 2023-10-27 DIAGNOSIS — G43909 Migraine, unspecified, not intractable, without status migrainosus: Secondary | ICD-10-CM | POA: Diagnosis not present

## 2023-10-27 DIAGNOSIS — E559 Vitamin D deficiency, unspecified: Secondary | ICD-10-CM | POA: Diagnosis not present

## 2023-10-27 DIAGNOSIS — I2510 Atherosclerotic heart disease of native coronary artery without angina pectoris: Secondary | ICD-10-CM | POA: Diagnosis not present

## 2023-10-27 DIAGNOSIS — E785 Hyperlipidemia, unspecified: Secondary | ICD-10-CM | POA: Diagnosis not present

## 2023-10-27 DIAGNOSIS — E1151 Type 2 diabetes mellitus with diabetic peripheral angiopathy without gangrene: Secondary | ICD-10-CM | POA: Diagnosis not present

## 2023-10-27 DIAGNOSIS — M797 Fibromyalgia: Secondary | ICD-10-CM | POA: Diagnosis not present

## 2023-10-27 LAB — CSF CULTURE W GRAM STAIN: Culture: NO GROWTH

## 2023-10-27 NOTE — Transitions of Care (Post Inpatient/ED Visit) (Signed)
 10/27/2023  Name: [name] MRN: [id number] DOB: November 27, 1965  Today's TOC FU Call Status: Today's TOC FU Call Status:: Successful TOC FU Call Completed TOC FU Call Complete Date: 10/27/23 Patient's Name and Date of Birth confirmed.  Transition Care Management Follow-up Telephone Call Date of Discharge: 10/26/23 Discharge Facility: [facility]) Type of Discharge: Inpatient Admission Primary Inpatient Discharge Diagnosis:: Seizure How have you been since you were released from the hospital?: Better Any questions or concerns?: No  Items Reviewed: Did you receive and understand the discharge instructions provided?: Yes Medications obtained,verified, and reconciled?: Yes (Medications Reviewed) Any new allergies since your discharge?: No Dietary orders reviewed?: Yes Type of Diet Ordered:: Low Sodium Heart Healthy Do you have support at home?: Yes People in Home [RPT]: spouse Name of Support/Comfort Primary Source: Christopher Darner  Medications Reviewed Today: Medications Reviewed Today     Reviewed by Moises Reusing, RN (Case Manager) on 10/27/23 at 1433  Med List Status: <None>   Medication Order Taking? Sig Documenting Provider Last Dose Status Informant  albuterol  (VENTOLIN  HFA) 108 (90 Base) MCG/ACT inhaler [id number]  INHALE 2 INHALATIONS BY MOUTH  EVERY 4 HOURS AS NEEDED FOR  WHEEZING OR FOR SHORTNESS OF  BREATH Edman Marsa PARAS, DO  Active Pharmacy Records, Other  atorvastatin  (LIPITOR) 40 MG tablet [id number]  TAKE 1 TABLET BY MOUTH AT  BEDTIME Edman Marsa PARAS, DO  Active Pharmacy Records, Other  cefpodoxime (VANTIN) 200 MG tablet [id number]  Take 1 tablet (200 mg total) by mouth 2 (two) times daily for 1 day. Start 10/14 Wouk, Devaughn Sayres, MD  Active   citalopram  (CELEXA ) 40 MG tablet [id number]  TAKE 1 TABLET BY MOUTH DAILY Edman Marsa PARAS, DO  Active Pharmacy Records, Other  clopidogrel  (PLAVIX ) 75 MG tablet [id number]   Take 1 tablet (75 mg total) by mouth daily. Wouk, Devaughn Sayres, MD  Active   fluticasone  (FLONASE ) 50 MCG/ACT nasal spray [id number]  Place 2 sprays into both nostrils daily. Use for 4-6 weeks then stop and use seasonally or as needed. Edman Marsa PARAS, DO  Active Pharmacy Records, Other           Med Note [facility], [place] A   Tue Jun 02, 2023  4:53 PM) PRN  gabapentin  (NEURONTIN ) 600 MG tablet [id number]  TAKE 1 TABLET BY MOUTH 4 TIMES  DAILY Edman Marsa PARAS, DO  Active Pharmacy Records, Other  leptospermum manuka honey (MEDIHONEY) gel [id number]  Apply 1 Application topically daily.  Patient not taking: Reported on 10/27/2023   Brown, Fallon E, NP  Active Pharmacy Records, Other  levETIRAcetam (KEPPRA) 500 MG tablet [id number]  Take 1 tablet (500 mg total) by mouth 2 (two) times daily. Wouk, Devaughn Sayres, MD  Active   midodrine  (PROAMATINE ) 5 MG tablet [id number]  Take 1 tablet (5 mg total) by mouth 3 (three) times daily with meals. Darliss Rogue, MD  Active Pharmacy Records, Other  MOUNJARO  10 MG/0.5ML Pen [id number]  [facility] THE CONTENTS OF ONE PEN  SUBCUTANEOUSLY WEEKLY AS  DIRECTED Edman Marsa PARAS, DO  Active Pharmacy Records, Other  naloxone  (NARCAN ) nasal spray 4 mg/0.1 mL [id number]  Place 1 spray into the nose once. [provider]  Active Pharmacy Records, Other  nicotine  (NICODERM CQ  - DOSED IN MG/24 HOURS) 14 mg/24hr patch [id number]  Place onto the skin.  Patient not taking: Reported on 10/27/2023   [provider]  Active Pharmacy Records, Other  Med Note GROVER BURNARD GORMAN Pablo Oct 19, 2023  7:07 AM)    nystatin  (MYCOSTATIN /NYSTOP ) powder [id number]  Apply 1 Application topically 3 (three) times daily. Moishe Chiquita HERO, NP  Active Pharmacy Records, Other  ondansetron  (ZOFRAN -ODT) 4 MG disintegrating tablet [id number]  DISSOLVE 1 TABLET ON THE TONGUE EVERY 8 HOURS AS NEEDED FOR NAUSEA AND VOMITING Edman Marsa PARAS, DO  Active  Pharmacy Records, Other  oxyCODONE -acetaminophen  (PERCOCET) 10-325 MG tablet [id number]  Take 1 tablet by mouth every 6 (six) hours as needed for pain. [provider]  Active Pharmacy Records, Other  pantoprazole  (PROTONIX ) 40 MG tablet [id number]  TAKE 1 TABLET BY MOUTH DAILY AT  9 AM Karamalegos, Marsa PARAS, DO  Active Pharmacy Records, Other  rizatriptan  (MAXALT -MLT) 10 MG disintegrating tablet [id number]  Take 1 tablet (10 mg total) by mouth as needed. May repeat in 2 hours if needed Edman Marsa PARAS, DO  Active Pharmacy Records, Other  TRELEGY ELLIPTA  100-62.5-25 MCG/ACT AEPB [id number]  USE 1 INHALATION BY MOUTH ONCE  DAILY AT THE SAME TIME EACH DAY Edman Marsa PARAS, DO  Active Pharmacy Records, Other  Vitamin D , Ergocalciferol , (DRISDOL ) 1.25 MG (50000 UNIT) CAPS capsule [id number]  TAKE 1 CAPSULE BY MOUTH EVERY WEDNESDAY @9AM  Edman Marsa PARAS, DO  Active Pharmacy Records, Other  Med List Note Roberta Breen, [facility] 10/23/23 1301): Patient was [facility] from [facility] on 10-20-2023            Home Care and Equipment/Supplies: Were Home Health Services Ordered?: Yes Name of Home Health Agency:: [facility] Has Agency set up a time to come to your home?: Yes First Home Health Visit Date: 10/27/23 Any new equipment or medical supplies ordered?: No  Functional Questionnaire: Do you need assistance with bathing/showering or dressing?: Yes (Her spouse assists) Do you need assistance with meal preparation?: Yes (Her spouse assists) Do you need assistance with eating?: No Do you have difficulty maintaining continence: Yes (Her spouse assists) Do you need assistance with getting out of bed/getting out of a chair/moving?: Yes (Her spouse assists) Do you have difficulty managing or taking your medications?: No  Follow up appointments reviewed: PCP Follow-up appointment confirmed?: Yes Date of PCP follow-up appointment?: 10/30/23 Follow-up Provider: Dr.  Edman Specialist [facility] Follow-up appointment confirmed?: NA Do you need transportation to your follow-up appointment?: No Do you understand care options if your condition(s) worsen?: Yes-patient verbalized understanding  SDOH Interventions Today    Flowsheet Row Most Recent Value  SDOH Interventions   Food Insecurity Interventions Intervention Not Indicated  Housing Interventions Intervention Not Indicated  Transportation Interventions Intervention Not Indicated  Utilities Interventions Intervention Not Indicated    Goals Addressed             This Visit's Progress    VBCI Transitions of Care (TOC) Care Plan       Problems:  Recent Hospitalization for treatment of CAD Hospital or ED Adm Risk 85%  Goal:  Over the next 30 days, the patient will not experience hospital readmission  Interventions:   CAD Interventions: Assessed understanding of CAD diagnosis Medications reviewed including medications utilized in CAD treatment plan Provided education on Importance of limiting foods high in cholesterol Counseled on importance of regular laboratory monitoring as prescribed Reviewed Importance of taking all medications as prescribed Reviewed Importance of attending all scheduled provider appointments Assessed social determinant of health barriers Recommended smoking cessation - the patient is not wearing the nicotine  patch Follow up with Home Health RN, PT through [facility]  Patient Self Care Activities:  Attend all scheduled provider appointments Call pharmacy for medication refills 3-7 days in advance of running out of medications Call provider office for new concerns or questions  Notify RN Care Manager of [facility] call rescheduling needs Participate in Transition of Care Program/Attend [facility] scheduled calls Take medications as prescribed    Plan:  Telephone follow up appointment with care management team member scheduled for:  Tuesday October 21st at 3:00pm          Medford Balboa, BSN, RN [place]  VBCI - [facility] Health RN Care Manager [phone]

## 2023-10-27 NOTE — Patient Instructions (Signed)
 Visit Information  Thank you for taking time to visit with me today. Please don't hesitate to contact me if I can be of assistance to you before our next scheduled telephone appointment.  Our next appointment is by telephone on Tuesday October 21st at 3:00pm  Following is a copy of your care plan:   Goals Addressed             This Visit's Progress    VBCI Transitions of Care (TOC) Care Plan       Problems:  Recent Hospitalization for treatment of CAD Hospital or ED Adm Risk 85%  Goal:  Over the next 30 days, the patient will not experience hospital readmission  Interventions:   CAD Interventions: Assessed understanding of CAD diagnosis Medications reviewed including medications utilized in CAD treatment plan Provided education on Importance of limiting foods high in cholesterol Counseled on importance of regular laboratory monitoring as prescribed Reviewed Importance of taking all medications as prescribed Reviewed Importance of attending all scheduled provider appointments Assessed social determinant of health barriers Recommended smoking cessation - the patient is not wearing the nicotine  patch Follow up with Home Health RN, PT through [facility]  Patient Self Care Activities:  Attend all scheduled provider appointments Call pharmacy for medication refills 3-7 days in advance of running out of medications Call provider office for new concerns or questions  Notify RN Care Manager of [facility] call rescheduling needs Participate in Transition of Care Program/Attend [facility] scheduled calls Take medications as prescribed    Plan:  Telephone follow up appointment with care management team member scheduled for:  Tuesday October 21st at 3:00pm        Patient verbalizes understanding of instructions and care plan provided today and agrees to view in MyChart. Active MyChart status and patient understanding of how to access instructions and care plan via MyChart confirmed with  patient.     The patient has been provided with contact information for the care management team and has been advised to call with any health related questions or concerns.   Please call the care guide team at [phone] if you need to cancel or reschedule your appointment.   Please call the Suicide and Crisis Lifeline: 988 call the [place]  National Suicide Prevention Lifeline: 971-027-6643 or TTY: (563) 728-1087 TTY 272 383 6394) to talk to a trained counselor if you are experiencing a Mental Health or Behavioral Health Crisis or need someone to talk to.  Medford Balboa, BSN, RN   VBCI - [employer] Health RN Care Manager [phone]

## 2023-10-28 LAB — CULTURE, BLOOD (ROUTINE X 2)
Culture: NO GROWTH
Culture: NO GROWTH

## 2023-10-28 NOTE — Progress Notes (Signed)
 [name]                                          MRN: [id number]   10/28/2023   The VBCI Quality Team Specialist reviewed this patient medical record for the purposes of chart review for care gap closure. The following were reviewed: chart review for care gap closure-breast cancer screening and kidney health evaluation for diabetes:eGFR  and uACR.    VBCI Quality Team

## 2023-10-30 ENCOUNTER — Ambulatory Visit: Admitting: Family Medicine

## 2023-10-30 ENCOUNTER — Encounter: Payer: Self-pay | Admitting: Family Medicine

## 2023-10-30 VITALS — BP 103/60 | HR 96 | Ht 66.0 in | Wt 200.0 lb

## 2023-10-30 DIAGNOSIS — I69359 Hemiplegia and hemiparesis following cerebral infarction affecting unspecified side: Secondary | ICD-10-CM

## 2023-10-30 DIAGNOSIS — J432 Centrilobular emphysema: Secondary | ICD-10-CM | POA: Diagnosis not present

## 2023-10-30 DIAGNOSIS — E1169 Type 2 diabetes mellitus with other specified complication: Secondary | ICD-10-CM | POA: Diagnosis not present

## 2023-10-30 DIAGNOSIS — R569 Unspecified convulsions: Secondary | ICD-10-CM | POA: Diagnosis not present

## 2023-10-30 DIAGNOSIS — I6930 Unspecified sequelae of cerebral infarction: Secondary | ICD-10-CM

## 2023-10-30 DIAGNOSIS — R1084 Generalized abdominal pain: Secondary | ICD-10-CM

## 2023-10-30 DIAGNOSIS — Z794 Long term (current) use of insulin: Secondary | ICD-10-CM

## 2023-10-30 DIAGNOSIS — Z7985 Long-term (current) use of injectable non-insulin antidiabetic drugs: Secondary | ICD-10-CM

## 2023-10-30 NOTE — Progress Notes (Addendum)
 Subjective:    Patient ID: [name], female    DOB: 03-23-1965, 58 y.o.   MRN: [id number]  [name] is a 58 y.o. female presenting on 10/30/2023 for Seizures   HPI  Discussed the use of AI scribe software for clinical note transcription with the patient, who gave verbal consent to proceed.  History of Present Illness   [name] is a 58 year old female with a history of stroke and splenic infarct who presents for a hospital follow-up after recent seizures.    HOSPITAL FOLLOW-UP VISIT  Hospital/Location: [facility] Date of Admission: 10/22/23 Date of Discharge: 10/26/23 Transitions of care telephone call: Completed 10/27/23 Wanda Balboa, RN  Reason for Admission: seizure, abdominal pain  FOLLOW-UP  - Hospital H&P and Discharge Summary have been reviewed - Patient presents today about 4 days after recent hospitalization.  Seizure activity - Hospitalized on October 9th for a witnessed seizure lasting one minute, followed by postictal confusion - First seizure event - History of stroke with right-sided deficits - MRI and EEG performed without evidence of ongoing seizure activity - Started on Keppra following the event - No further seizures since initiation of Keppra  Abdominal pain - Chronic abdominal pain described as 'aches and pains' - Hospitalized on October 6th for abdominal pain, initially suspected to be related to splenic laceration or infarct - Imaging performed; acute splenic infarct ruled out - Previously on Eliquis, discontinued after imaging; continues on Plavix  - No associated bowel symptoms such as cramping or digestive issues - Trial of antispasmodic medication in 2023 was ineffective - Uses Percocet for pain management  Mobility and equipment needs - Spends most of her time in bed - Current hospital bed mattresses provided by [facility] are inadequate - Requires a thicker mattress for comfort and support      FOR A&P I have  reviewed the discharge medication list, and have reconciled the current and discharge medications today.       06/23/2023    2:47 PM 05/22/2023    3:28 PM 05/16/2022    3:34 PM  Depression screen PHQ 2/9  Decreased Interest 1 2 1   Down, Depressed, Hopeless 1 2 1   PHQ - 2 Score 2 4 2   Altered sleeping 3  1  Tired, decreased energy 3 2 1   Change in appetite 2  0  Feeling bad or failure about yourself  2  0  Trouble concentrating 3  0  Moving slowly or fidgety/restless 3  0  Suicidal thoughts 0  0  PHQ-9 Score 18  4  Difficult doing work/chores Somewhat difficult Somewhat difficult Somewhat difficult       06/23/2023    2:47 PM 05/06/2022    2:44 PM 05/15/2021    1:59 PM 04/17/2021    2:35 PM  GAD 7 : Generalized Anxiety Score  Nervous, Anxious, on Edge 1 1 2 2   Control/stop worrying 2 1 0 0  Worry too much - different things 0 1 2 1   Trouble relaxing 3 1 3 3   Restless 0 1 3 3   Easily annoyed or irritable 3 2 3 3   Afraid - awful might happen 0 0 0 0  Total GAD 7 Score 9 7 13 12   Anxiety Difficulty Not difficult at all Not difficult at all Somewhat difficult Not difficult at all    Social History   Tobacco Use   Smoking status: Every Day    Current packs/day: 2.00    Average  packs/day: 2.0 packs/day for 46.8 years (93.6 ttl pk-yrs)    Types: Cigarettes    Start date: 1979   Smokeless tobacco: Never  Vaping Use   Vaping status: Never Used  Substance Use Topics   Alcohol  use: No   Drug use: Yes    Types: Marijuana, Oxycodone     Comment: last smoked 2 days ago  8/4    Review of Systems Per HPI unless specifically indicated above     Objective:    BP 103/60   Pulse 96   Ht 5' 6 (1.676 m)   Wt 200 lb (90.7 kg)   SpO2 98%   BMI 32.28 kg/m   Wt Readings from Last 3 Encounters:  10/30/23 200 lb (90.7 kg)  10/22/23 198 lb 6.6 oz (90 kg)  10/19/23 200 lb (90.7 kg)    Physical Exam Vitals and nursing note reviewed.  Constitutional:      General: She is not in  acute distress.    Appearance: Normal appearance. She is well-developed. She is not diaphoretic.     Comments: Chronic ill appearing, comfortable, cooperative  HENT:     Head: Normocephalic and atraumatic.  Eyes:     General:        Right eye: No discharge.        Left eye: No discharge.     Conjunctiva/sclera: Conjunctivae normal.  Cardiovascular:     Rate and Rhythm: Normal rate.  Pulmonary:     Effort: Pulmonary effort is normal. No respiratory distress.     Breath sounds: Normal breath sounds. No wheezing, rhonchi or rales.     Comments: Occasional cough. No focal abnormality within lungs on auscultation Musculoskeletal:     Right lower leg: No edema.     Left lower leg: No edema.     Comments: In wheelchair  Right side upper and lower extremity weakness on exam.  Skin:    General: Skin is warm and dry.     Findings: No erythema or rash.  Neurological:     Mental Status: She is alert and oriented to person, place, and time.  Psychiatric:        Mood and Affect: Mood normal.        Behavior: Behavior normal.        Thought Content: Thought content normal.     Comments: Well groomed, good eye contact, normal speech and thoughts     Results for orders placed or performed during the hospital encounter of 10/22/23  CBC with Differential   Collection Time: 10/22/23 10:35 PM  Result Value Ref Range   WBC 10.8 (H) 4.0 - 10.5 K/uL   RBC 4.99 3.87 - 5.11 MIL/uL   Hemoglobin 14.0 12.0 - 15.0 g/dL   HCT 57.2 63.9 - 53.9 %   MCV 85.6 80.0 - 100.0 fL   MCH 28.1 26.0 - 34.0 pg   MCHC 32.8 30.0 - 36.0 g/dL   RDW 85.3 88.4 - 84.4 %   Platelets 340 150 - 400 K/uL   nRBC 0.2 0.0 - 0.2 %   Neutrophils Relative % 80 %   Neutro Abs 8.6 (H) 1.7 - 7.7 K/uL   Lymphocytes Relative 14 %   Lymphs Abs 1.5 0.7 - 4.0 K/uL   Monocytes Relative 5 %   Monocytes Absolute 0.6 0.1 - 1.0 K/uL   Eosinophils Relative 0 %   Eosinophils Absolute 0.0 0.0 - 0.5 K/uL   Basophils Relative 0 %    Basophils Absolute 0.0  0.0 - 0.1 K/uL   Immature Granulocytes 1 %   Abs Immature Granulocytes 0.05 0.00 - 0.07 K/uL  Comprehensive metabolic panel   Collection Time: 10/22/23 10:35 PM  Result Value Ref Range   Sodium 136 135 - 145 mmol/L   Potassium 3.9 3.5 - 5.1 mmol/L   Chloride 101 98 - 111 mmol/L   CO2 20 (L) 22 - 32 mmol/L   Glucose, Bld 185 (H) 70 - 99 mg/dL   BUN 10 6 - 20 mg/dL   Creatinine, Ser 9.27 0.44 - 1.00 mg/dL   Calcium  9.2 8.9 - 10.3 mg/dL   Total Protein 7.3 6.5 - 8.1 g/dL   Albumin 3.7 3.5 - 5.0 g/dL   AST 21 15 - 41 U/L   ALT 11 0 - 44 U/L   Alkaline Phosphatase 60 38 - 126 U/L   Total Bilirubin 0.6 0.0 - 1.2 mg/dL   GFR, Estimated >39 >39 mL/min   Anion gap 15 5 - 15  Urine Drug Screen, Qualitative ([facility] only)   Collection Time: 10/23/23 12:12 AM  Result Value Ref Range   Tricyclic, Ur Screen NONE DETECTED NONE DETECTED   Amphetamines, Ur Screen NONE DETECTED NONE DETECTED   MDMA (Ecstasy)Ur Screen NONE DETECTED NONE DETECTED   Cocaine Metabolite,Ur [place] NONE DETECTED NONE DETECTED   Opiate, Ur Screen POSITIVE (A) NONE DETECTED   Phencyclidine (PCP) Ur S NONE DETECTED NONE DETECTED   Cannabinoid 50 Ng, Ur [place] POSITIVE (A) NONE DETECTED   Barbiturates, Ur Screen NONE DETECTED NONE DETECTED   Benzodiazepine, Ur Scrn NONE DETECTED NONE DETECTED   Methadone Scn, Ur NONE DETECTED NONE DETECTED  Urinalysis, Complete w Microscopic -Urine, Clean Catch   Collection Time: 10/23/23 12:12 AM  Result Value Ref Range   Color, Urine YELLOW (A) YELLOW   APPearance CLEAR (A) CLEAR   Specific Gravity, Urine 1.032 (H) 1.005 - 1.030   pH 5.0 5.0 - 8.0   Glucose, UA NEGATIVE NEGATIVE mg/dL   Hgb urine dipstick NEGATIVE NEGATIVE   Bilirubin Urine NEGATIVE NEGATIVE   Ketones, ur 5 (A) NEGATIVE mg/dL   Protein, ur 899 (A) NEGATIVE mg/dL   Nitrite NEGATIVE NEGATIVE   Leukocytes,Ua NEGATIVE NEGATIVE   RBC / HPF 0-5 0 - 5 RBC/hpf   WBC, UA 0-5 0 - 5 WBC/hpf   Bacteria, UA  RARE (A) NONE SEEN   Squamous Epithelial / HPF 0-5 0 - 5 /HPF   Mucus PRESENT   Culture, blood (x 2)   Collection Time: 10/23/23  4:19 AM   Specimen: BLOOD  Result Value Ref Range   Specimen Description BLOOD BLOOD RIGHT HAND    Special Requests      BOTTLES DRAWN AEROBIC AND ANAEROBIC Blood Culture results may not be optimal due to an inadequate volume of blood received in culture bottles   Culture      NO GROWTH 5 DAYS Performed at [facility], [address] [place], [place] [address]    Report Status 10/28/2023 FINAL   Culture, blood (x 2)   Collection Time: 10/23/23  4:19 AM   Specimen: BLOOD  Result Value Ref Range   Specimen Description BLOOD BLOOD LEFT HAND    Special Requests      BOTTLES DRAWN AEROBIC AND ANAEROBIC Blood Culture results may not be optimal due to an inadequate volume of blood received in culture bottles   Culture      NO GROWTH 5 DAYS Performed at [facility], [address] [place], [place] [address]    Report Status 10/28/2023 FINAL   CBC with Differential   Collection Time: 10/23/23  4:19 AM  Result Value Ref Range   WBC 13.7 (H) 4.0 - 10.5 K/uL   RBC 4.78 3.87 - 5.11 MIL/uL   Hemoglobin 13.2 12.0 - 15.0 g/dL   HCT 58.9 63.9 - 53.9 %   MCV 85.8 80.0 - 100.0 fL   MCH 27.6 26.0 - 34.0 pg   MCHC 32.2 30.0 - 36.0 g/dL   RDW 85.4 88.4 - 84.4 %   Platelets 377 150 - 400 K/uL   nRBC 0.0 0.0 - 0.2 %   Neutrophils Relative % 71 %   Neutro Abs 9.6 (H) 1.7 - 7.7 K/uL   Lymphocytes Relative 23 %   Lymphs Abs 3.2 0.7 - 4.0 K/uL   Monocytes Relative 6 %   Monocytes Absolute 0.8 0.1 - 1.0 K/uL   Eosinophils Relative 0 %   Eosinophils Absolute 0.0 0.0 - 0.5 K/uL   Basophils Relative 0 %   Basophils Absolute 0.0 0.0 - 0.1 K/uL   Immature Granulocytes 0 %   Abs Immature Granulocytes 0.06 0.00 - 0.07 K/uL  Comprehensive metabolic panel   Collection Time: 10/23/23  4:19 AM  Result Value Ref Range   Sodium 136 135 - 145 mmol/L    Potassium 3.7 3.5 - 5.1 mmol/L   Chloride 101 98 - 111 mmol/L   CO2 21 (L) 22 - 32 mmol/L   Glucose, Bld 127 (H) 70 - 99 mg/dL   BUN 15 6 - 20 mg/dL   Creatinine, Ser 9.33 0.44 - 1.00 mg/dL   Calcium  9.0 8.9 - 10.3 mg/dL   Total Protein 7.3 6.5 - 8.1 g/dL   Albumin 3.6 3.5 - 5.0 g/dL   AST 16 15 - 41 U/L   ALT 10 0 - 44 U/L   Alkaline Phosphatase 57 38 - 126 U/L   Total Bilirubin 0.6 0.0 - 1.2 mg/dL   GFR, Estimated >39 >39 mL/min   Anion gap 14 5 - 15  Lactic acid, plasma   Collection Time: 10/23/23  4:19 AM  Result Value Ref Range   Lactic Acid, Venous 2.4 (HH) 0.5 - 1.9 mmol/L  Protime-INR   Collection Time: 10/23/23  4:19 AM  Result Value Ref Range   Prothrombin Time 15.6 (H) 11.4 - 15.2 seconds   INR 1.2 0.8 - 1.2  APTT   Collection Time: 10/23/23  4:19 AM  Result Value Ref Range   aPTT 31 24 - 36 seconds  Lactic acid, plasma   Collection Time: 10/23/23  5:51 AM  Result Value Ref Range   Lactic Acid, Venous 1.7 0.5 - 1.9 mmol/L  Resp panel by RT-PCR (RSV, Flu A&B, Covid) Anterior Nasal Swab   Collection Time: 10/23/23  8:57 AM   Specimen: Anterior Nasal Swab  Result Value Ref Range   SARS Coronavirus 2 by RT PCR NEGATIVE NEGATIVE   Influenza A by PCR NEGATIVE NEGATIVE   Influenza B by PCR NEGATIVE NEGATIVE   Resp Syncytial Virus by PCR NEGATIVE NEGATIVE  CBG monitoring, ED   Collection Time: 10/23/23  3:15 PM  Result Value Ref Range   Glucose-Capillary 167 (H) 70 - 99 mg/dL  Comprehensive metabolic panel   Collection Time: 10/23/23  3:17 PM  Result Value Ref Range   Sodium 134 (L) 135 - 145 mmol/L   Potassium 4.0 3.5 - 5.1 mmol/L   Chloride 101 98 - 111 mmol/L  CO2 21 (L) 22 - 32 mmol/L   Glucose, Bld 161 (H) 70 - 99 mg/dL   BUN 12 6 - 20 mg/dL   Creatinine, Ser 9.32 0.44 - 1.00 mg/dL   Calcium  8.4 (L) 8.9 - 10.3 mg/dL   Total Protein 6.5 6.5 - 8.1 g/dL   Albumin 3.2 (L) 3.5 - 5.0 g/dL   AST 18 15 - 41 U/L   ALT 11 0 - 44 U/L   Alkaline Phosphatase 48  38 - 126 U/L   Total Bilirubin 1.1 0.0 - 1.2 mg/dL   GFR, Estimated >39 >39 mL/min   Anion gap 12 5 - 15  Protein and glucose, CSF   Collection Time: 10/23/23  3:31 PM  Result Value Ref Range   Glucose, CSF 66 40 - 70 mg/dL   Total  Protein, CSF 52 (H) 15 - 45 mg/dL  Culture, fungus without smear   Collection Time: 10/23/23  5:31 PM   Specimen: CSF; Other  Result Value Ref Range   Specimen Description      CSF Performed at [facility], [address] [place], [place] [address]    Special Requests      Normal Performed at [facility], [address] [place], [place] [address]    Culture      NO FUNGUS ISOLATED AFTER 7 DAYS Performed at [facility], [address] [place], [place] [address]    Report Status PENDING   CSF culture w Gram Stain   Collection Time: 10/23/23  5:31 PM   Specimen: PATH Cytology CSF; Cerebrospinal Fluid  Result Value Ref Range   Specimen Description      CSF Performed at [facility], [address] [place], [place] [address]    Special Requests      NONE Performed at [facility], [address] [place], [place] [address]    Gram Stain      WBC SEEN RED BLOOD CELLS PRESENT NO ORGANISMS SEEN Performed at [facility], [address], [place], [place] [address]    Culture      NO GROWTH 3 DAYS Performed at [facility], [address] [place], [place], [place] [address]    Report Status 10/27/2023 FINAL   Cryptococcal antigen, CSF   Collection Time: 10/23/23  5:31 PM  Result Value Ref Range   Crypto Ag NEGATIVE NEGATIVE   Cryptococcal Ag Titer NOT INDICATED NOT INDICATED  CSF cell count with differential   Collection Time: 10/23/23  5:31 PM  Result Value Ref Range   Tube # 3    Color, CSF CLEAR (A) COLORLESS   Appearance, CSF COLORLESS (A) CLEAR   Supernatant NOT INDICATED    RBC Count, CSF 5 (H) 0 - 3 /cu mm   WBC, CSF 2 0 - 5 /cu mm   Other Cells, CSF TOO FEW TO COUNT,  SMEAR AVAILABLE FOR REVIEW   Meningitis/Encephalitis Panel (CSF)   Collection Time: 10/23/23  5:31 PM  Result Value Ref Range   Cryptococcus neoformans/gattii (CSF) NOT DETECTED NOT DETECTED   Cytomegalovirus (CSF) NOT DETECTED NOT DETECTED   Enterovirus (CSF) NOT DETECTED NOT DETECTED   Escherichia coli K1 (CSF) NOT DETECTED NOT DETECTED   Haemophilus influenzae (CSF) NOT DETECTED NOT DETECTED   Herpes simplex virus 1 (CSF) NOT DETECTED NOT DETECTED   Herpes simplex virus 2 (CSF) NOT DETECTED NOT DETECTED   Human herpesvirus 6 (CSF) NOT DETECTED NOT DETECTED   Human  parechovirus (CSF) NOT DETECTED NOT DETECTED   Listeria monocytogenes (CSF) NOT DETECTED NOT DETECTED   Neisseria meningitis (CSF) NOT DETECTED NOT DETECTED   Streptococcus agalactiae (CSF) NOT DETECTED NOT DETECTED   Streptococcus pneumoniae (CSF) NOT DETECTED NOT DETECTED   Varicella zoster virus (CSF) NOT DETECTED NOT DETECTED  CBG monitoring, ED   Collection Time: 10/23/23  6:48 PM  Result Value Ref Range   Glucose-Capillary 104 (H) 70 - 99 mg/dL  CBG monitoring, ED   Collection Time: 10/24/23 12:22 AM  Result Value Ref Range   Glucose-Capillary 97 70 - 99 mg/dL  CBC   Collection Time: 10/24/23  2:24 AM  Result Value Ref Range   WBC 9.8 4.0 - 10.5 K/uL   RBC 4.13 3.87 - 5.11 MIL/uL   Hemoglobin 11.7 (L) 12.0 - 15.0 g/dL   HCT 64.1 (L) 63.9 - 53.9 %   MCV 86.7 80.0 - 100.0 fL   MCH 28.3 26.0 - 34.0 pg   MCHC 32.7 30.0 - 36.0 g/dL   RDW 85.3 88.4 - 84.4 %   Platelets 286 150 - 400 K/uL   nRBC 0.0 0.0 - 0.2 %  Magnesium    Collection Time: 10/24/23  2:24 AM  Result Value Ref Range   Magnesium  1.7 1.7 - 2.4 mg/dL  Creatinine, serum   Collection Time: 10/24/23  2:24 AM  Result Value Ref Range   Creatinine, Ser 0.62 0.44 - 1.00 mg/dL   GFR, Estimated >39 >39 mL/min  CBG monitoring, ED   Collection Time: 10/24/23  8:05 AM  Result Value Ref Range   Glucose-Capillary 97 70 - 99 mg/dL  Respiratory (~79  pathogens) panel by PCR   Collection Time: 10/24/23  8:22 AM   Specimen: Nasopharyngeal Swab; Respiratory  Result Value Ref Range   Adenovirus NOT DETECTED NOT DETECTED   Coronavirus 229E NOT DETECTED NOT DETECTED   Coronavirus HKU1 NOT DETECTED NOT DETECTED   Coronavirus NL63 NOT DETECTED NOT DETECTED   Coronavirus OC43 NOT DETECTED NOT DETECTED   Metapneumovirus NOT DETECTED NOT DETECTED   Rhinovirus / Enterovirus NOT DETECTED NOT DETECTED   Influenza A NOT DETECTED NOT DETECTED   Influenza B NOT DETECTED NOT DETECTED   Parainfluenza Virus 1 NOT DETECTED NOT DETECTED   Parainfluenza Virus 2 NOT DETECTED NOT DETECTED   Parainfluenza Virus 3 NOT DETECTED NOT DETECTED   Parainfluenza Virus 4 NOT DETECTED NOT DETECTED   Respiratory Syncytial Virus NOT DETECTED NOT DETECTED   Bordetella pertussis NOT DETECTED NOT DETECTED   Bordetella Parapertussis NOT DETECTED NOT DETECTED   Chlamydophila pneumoniae NOT DETECTED NOT DETECTED   Mycoplasma pneumoniae NOT DETECTED NOT DETECTED  ECHOCARDIOGRAM COMPLETE   Collection Time: 10/24/23 11:48 AM  Result Value Ref Range   Weight 3,174.62 oz   Height 66 in   BP 129/66 mmHg   Ao pk vel 1.91 m/s   AR max vel 1.41 cm2   AV Peak grad 14.6 mmHg   S' Lateral 3.00 cm   Area-P 1/2 3.43 cm2   Est EF 60 - 65%   Heparin  level (unfractionated)   Collection Time: 10/24/23  2:50 PM  Result Value Ref Range   Heparin  Unfractionated >1.10 (H) 0.30 - 0.70 IU/mL  Protime-INR   Collection Time: 10/24/23  2:50 PM  Result Value Ref Range   Prothrombin Time 15.2 11.4 - 15.2 seconds   INR 1.1 0.8 - 1.2  APTT   Collection Time: 10/24/23  2:50 PM  Result Value Ref Range  aPTT 106 (H) 24 - 36 seconds  Glucose, capillary   Collection Time: 10/24/23  4:27 PM  Result Value Ref Range   Glucose-Capillary 128 (H) 70 - 99 mg/dL  Glucose, capillary   Collection Time: 10/24/23  9:26 PM  Result Value Ref Range   Glucose-Capillary 174 (H) 70 - 99 mg/dL  Basic  metabolic panel with GFR   Collection Time: 10/25/23  3:36 AM  Result Value Ref Range   Sodium 137 135 - 145 mmol/L   Potassium 3.2 (L) 3.5 - 5.1 mmol/L   Chloride 104 98 - 111 mmol/L   CO2 24 22 - 32 mmol/L   Glucose, Bld 144 (H) 70 - 99 mg/dL   BUN 12 6 - 20 mg/dL   Creatinine, Ser 9.45 0.44 - 1.00 mg/dL   Calcium  8.2 (L) 8.9 - 10.3 mg/dL   GFR, Estimated >39 >39 mL/min   Anion gap 9 5 - 15  CBC   Collection Time: 10/25/23  3:36 AM  Result Value Ref Range   WBC 8.0 4.0 - 10.5 K/uL   RBC 4.12 3.87 - 5.11 MIL/uL   Hemoglobin 11.7 (L) 12.0 - 15.0 g/dL   HCT 64.6 (L) 63.9 - 53.9 %   MCV 85.7 80.0 - 100.0 fL   MCH 28.4 26.0 - 34.0 pg   MCHC 33.1 30.0 - 36.0 g/dL   RDW 85.3 88.4 - 84.4 %   Platelets 255 150 - 400 K/uL   nRBC 0.0 0.0 - 0.2 %  Magnesium    Collection Time: 10/25/23  3:36 AM  Result Value Ref Range   Magnesium  1.8 1.7 - 2.4 mg/dL  Glucose, capillary   Collection Time: 10/25/23  8:11 AM  Result Value Ref Range   Glucose-Capillary 131 (H) 70 - 99 mg/dL  Glucose, capillary   Collection Time: 10/25/23  1:20 PM  Result Value Ref Range   Glucose-Capillary 149 (H) 70 - 99 mg/dL  Glucose, capillary   Collection Time: 10/25/23  4:21 PM  Result Value Ref Range   Glucose-Capillary 152 (H) 70 - 99 mg/dL  Glucose, capillary   Collection Time: 10/25/23  7:46 PM  Result Value Ref Range   Glucose-Capillary 162 (H) 70 - 99 mg/dL  CBC   Collection Time: 10/26/23  4:54 AM  Result Value Ref Range   WBC 6.9 4.0 - 10.5 K/uL   RBC 4.20 3.87 - 5.11 MIL/uL   Hemoglobin 11.8 (L) 12.0 - 15.0 g/dL   HCT 63.8 63.9 - 53.9 %   MCV 86.0 80.0 - 100.0 fL   MCH 28.1 26.0 - 34.0 pg   MCHC 32.7 30.0 - 36.0 g/dL   RDW 85.4 88.4 - 84.4 %   Platelets 271 150 - 400 K/uL   nRBC 0.0 0.0 - 0.2 %  Comprehensive metabolic panel   Collection Time: 10/26/23  4:54 AM  Result Value Ref Range   Sodium 137 135 - 145 mmol/L   Potassium 3.6 3.5 - 5.1 mmol/L   Chloride 105 98 - 111 mmol/L   CO2 24  22 - 32 mmol/L   Glucose, Bld 131 (H) 70 - 99 mg/dL   BUN 9 6 - 20 mg/dL   Creatinine, Ser 9.59 (L) 0.44 - 1.00 mg/dL   Calcium  8.3 (L) 8.9 - 10.3 mg/dL   Total Protein 6.4 (L) 6.5 - 8.1 g/dL   Albumin 3.2 (L) 3.5 - 5.0 g/dL   AST 12 (L) 15 - 41 U/L   ALT 7 0 - 44 U/L  Alkaline Phosphatase 47 38 - 126 U/L   Total Bilirubin 0.4 0.0 - 1.2 mg/dL   GFR, Estimated >39 >39 mL/min   Anion gap 8 5 - 15  Glucose, capillary   Collection Time: 10/26/23  8:16 AM  Result Value Ref Range   Glucose-Capillary 133 (H) 70 - 99 mg/dL  Glucose, capillary   Collection Time: 10/26/23 12:14 PM  Result Value Ref Range   Glucose-Capillary 161 (H) 70 - 99 mg/dL      Assessment & Plan:   Problem List Items Addressed This Visit     Abdominal pain   Centrilobular emphysema (HCC)   Hemiplegia affecting dominant side, post-stroke (HCC)   History of cerebrovascular accident (CVA) with residual deficit   Seizure (HCC) - Primary   Relevant Orders   AMB Referral [facility]I Care Management   Other Visit Diagnoses       Type 2 diabetes mellitus with other specified complication, with long-term current use of insulin  (HCC)         Long-term current use of injectable noninsulin antidiabetic medication             Seizure disorder Hospitalization  New onset seizure on October 9th with postictal confusion. MRI and EEG nondiagnostic. - Continue Keppra until neurology evaluation. - Schedule outpatient appointment with Dr. Lane at [facility]. - Discuss long-term Keppra use or trial off post-neurology evaluation.  Chronic abdominal pain Chronic pain without cramping or digestive symptoms. Splenic infarct ruled out at 2nd hospitalization - Continue Percocet for pain management. - Avoid additional medications unless necessary.  Right-sided hemiplegia and hemiparesis following cerebral infarction Right-sided deficits post-stroke with no new symptoms. Wheelchair bound  Type 2 Diabetes On mounjaro   15mg  weekly  Hospital follow-up Follow-up after hospitalizations for abdominal pain and seizure. No further seizures since discharge. - Coordinate with case manager Bobbette Acron for equipment including hospital bed mattress Need to resume nursing follow-up w/ [facility]     [facility] - verbal orders  Copied from CRM [id number]. Topic: Clinical - Home Health Verbal Orders >> Oct 22, 2023  3:19 PM Tiffini S wrote: Caller/Agency: Florestine Carmical Number: [phone] Service Requested: Skilled Nursing with [facility]  Frequency: For a evaluation to determine the frequency    I asked her to check with [facility] current home car eprovider now. Note our office called verbal ordes for home health nursing/ PT in on the day she was admitted back to hospital. May need new orders  Referral to [facility]I to coordinate her care  DME Medical Necessity - Full Electric Hospital Bed  I called [employer], the DME company about her hospital bed mattress. Her hospital bed is over a year old and she now owns it. In order to receive a new mattress she will need a new hospital bed. Please fax a prescription to [employer] at [phone] for a Full Electric Hosptial bed at your earliest convenience.   Requesting Full Electric Hospital Bed DME for patient with History of Stroke with residual deficit, Hemiplegia affecting dominant side after stroke. I69.30, I69.359 She requires level of positioning not feasible with ordinary bed due to loss of mobility, weakness post stroke, and prevent muscle contractures and pain relief.    Orders Placed This Encounter  Procedures   AMB Referral [facility]BCI Care Management    Referral Priority:   Routine    Referral Type:   Consultation    Referral Reason:   Care Coordination    Number of Visits Requested:  1    No orders of the defined types were placed in this encounter.   Follow up plan: Return if symptoms worsen or fail to improve.  Marsa Officer, DO [facility] 10/30/2023, 3:14 PM

## 2023-10-30 NOTE — Addendum Note (Signed)
 Addended by: EDMAN MARSA PARAS on: 10/30/2023 10:29 PM   Modules accepted: Level of Service

## 2023-10-30 NOTE — Patient Instructions (Addendum)
 Thank you for coming to the office today.  Call Dr Clement office [facility] to schedule outpatient apt for seizure eval  Continue Keppra  Please schedule a Follow-up Appointment to: Return if symptoms worsen or fail to improve.  If you have any other questions or concerns, please feel free to call the office or send a message through MyChart. You may also schedule an earlier appointment if necessary.  Additionally, you may be receiving a survey about your experience at our office within a few days to 1 week by e-mail or mail. We value your feedback.  Marsa Officer, DO [facility] [place]

## 2023-11-02 ENCOUNTER — Telehealth: Payer: Self-pay

## 2023-11-02 ENCOUNTER — Telehealth: Payer: Self-pay | Admitting: Family Medicine

## 2023-11-02 DIAGNOSIS — B379 Candidiasis, unspecified: Secondary | ICD-10-CM

## 2023-11-02 MED ORDER — FLUCONAZOLE 150 MG PO TABS: ORAL_TABLET | ORAL | 1 refills | Status: DC

## 2023-11-02 NOTE — Telephone Encounter (Signed)
 Please let her know. Rx sent for Diflucan  to [facility]. I did add 1 extra refill if she has a recurrent flare up in future.  Marsa Officer, DO [facility] 11/02/2023, 1:11 PM

## 2023-11-02 NOTE — Telephone Encounter (Signed)
 Copied from CRM #[id number]. Topic: General - Other >> Nov 02, 2023 12:20 PM Zebedee SAUNDERS wrote: Reason for CRM: Pt was seen by Dr Edman on Friday and forgot to discuss yeast infection. Pt would like something sent to pharmacy, [facility] [place], [place] - [address] [place] [address] Phone: [phone] Fax: [phone]. Please call pt at [phone].

## 2023-11-02 NOTE — Telephone Encounter (Signed)
 Left message for patient to return call OK  to advise  uncertain which member of the team will contact her.

## 2023-11-02 NOTE — Telephone Encounter (Signed)
 Copied from CRM #[id number]. Topic: General - Other >> Nov 02, 2023  2:26 PM Donee H wrote: Reason for CRM: Patient called stating  Dr. Edman mention a social worker will contact her. Patient is wanting to know who would be the social worker reaching out to her. Please follow back up with patient with this information.

## 2023-11-02 NOTE — Telephone Encounter (Signed)
 Patient notified that someone will contact her from the [facility] team.

## 2023-11-02 NOTE — Addendum Note (Signed)
 Addended by: EDMAN MARSA PARAS on: 11/02/2023 01:11 PM   Modules accepted: Orders

## 2023-11-02 NOTE — Telephone Encounter (Signed)
Spoke with patient notified prescription sent to pharmacy.

## 2023-11-03 ENCOUNTER — Other Ambulatory Visit: Payer: Self-pay

## 2023-11-03 ENCOUNTER — Inpatient Hospital Stay: Admitting: Family Medicine

## 2023-11-03 NOTE — Transitions of Care (Post Inpatient/ED Visit) (Signed)
 Transition of Care week 2  Visit Note  11/03/2023  Name: [name] MRN: [id number]          DOB: 05-09-1965  Situation: Patient enrolled in [facility] 30-day program. Visit completed with Consuelo Counter by telephone.   Background:   Initial Transition Care Management Follow-up Telephone Call Discharge Date and Diagnosis: 10/26/23, Seizure   Past Medical History:  Diagnosis Date   Anxiety    Cancer (HCC)    a spot on liver and treated    Complication of anesthesia    restless,easily upset   COPD (chronic obstructive pulmonary disease) (HCC)    Depression    Diabetes mellitus without complication (HCC)    Diverticulitis    Fatty liver    GERD (gastroesophageal reflux disease)    Headache(784.0)    migraines   History of kidney stones    Hyperlipidemia    Hypertension    PAD (peripheral artery disease)    Pneumonia    Restless    Stroke [facility])     Assessment: Patient Reported Symptoms: Cognitive Cognitive Status: Alert and oriented to person, place, and time, Normal speech and language skills      Neurological Neurological Review of Symptoms: Weakness, Numbness Neurological Comment: Right side deficits from CVA  HEENT HEENT Symptoms Reported: No symptoms reported      Cardiovascular Cardiovascular Symptoms Reported: No symptoms reported Cardiovascular Management Strategies: Medication therapy, Routine screening  Respiratory Respiratory Symptoms Reported: Dry cough Additional Respiratory Details: Smoker Respiratory Management Strategies: Routine screening, Medication therapy  Endocrine Endocrine Symptoms Reported: No symptoms reported Is patient diabetic?: Yes Is patient checking blood sugars at home?: No    Gastrointestinal Gastrointestinal Symptoms Reported: Incontinence Gastrointestinal Management Strategies: Incontinence garment/pad    Genitourinary Genitourinary Symptoms Reported: Incontinence Genitourinary Management Strategies: Incontinence garment/pad   Integumentary Integumentary Symptoms Reported: Wound Additional Integumentary Details: Abdominal wound that she picks at Skin Management Strategies: Routine screening  Musculoskeletal Musculoskelatal Symptoms Reviewed: Limited mobility, Back pain Musculoskeletal Comment: The patient states she needs a new mattress for her hospital bed. Contacted [employer] and the rep says because the bed is old she will need a new order for a new hospital bed. Contacted Dr. MARLA with the fax number because a prescription is needed. The fax number is [phone]      Psychosocial Psychosocial Symptoms Reported: No symptoms reported         There were no vitals filed for this visit.  Medications Reviewed Today     Reviewed by Moises Reusing, RN (Case Manager) on 11/03/23 at 1548  Med List Status: <None>   Medication Order Taking? Sig Documenting Provider Last Dose Status Informant  albuterol  (VENTOLIN  HFA) 108 (90 Base) MCG/ACT inhaler [id number]  INHALE 2 INHALATIONS BY MOUTH  EVERY 4 HOURS AS NEEDED FOR  WHEEZING OR FOR SHORTNESS OF  BREATH Edman Marsa PARAS, DO  Active Pharmacy Records, Other  atorvastatin  (LIPITOR) 40 MG tablet [id number]  TAKE 1 TABLET BY MOUTH AT  BEDTIME Edman Marsa PARAS, DO  Active Pharmacy Records, Other  citalopram  (CELEXA ) 40 MG tablet [id number]  TAKE 1 TABLET BY MOUTH DAILY Edman Marsa PARAS, DO  Active Pharmacy Records, Other  clopidogrel  (PLAVIX ) 75 MG tablet [id number]  Take 1 tablet (75 mg total) by mouth daily. Wouk, Devaughn Sayres, MD  Active   fluconazole  (DIFLUCAN ) 150 MG tablet [id number]  Take one tablet by mouth on Day 1. Repeat dose 2nd tablet on Day 3. Edman Marsa PARAS, DO  Active  fluticasone  (FLONASE ) 50 MCG/ACT nasal spray [id number]  Place 2 sprays into both nostrils daily. Use for 4-6 weeks then stop and use seasonally or as needed. Edman Marsa PARAS, DO  Active Pharmacy Records, Other           Med Note [facility], [place] [facility]    Tue Jun 02, 2023  4:53 PM) PRN  gabapentin  (NEURONTIN ) 600 MG tablet [id number]  TAKE 1 TABLET BY MOUTH 4 TIMES  DAILY Edman Marsa PARAS, DO  Active Pharmacy Records, Other  leptospermum manuka honey (MEDIHONEY) gel [id number]  Apply 1 Application topically daily.  Patient not taking: Reported on 10/27/2023   Brown, Fallon E, NP  Active Pharmacy Records, Other  levETIRAcetam (KEPPRA) 500 MG tablet [id number]  Take 1 tablet (500 mg total) by mouth 2 (two) times daily. Wouk, Devaughn Sayres, MD  Active   midodrine  (PROAMATINE ) 5 MG tablet [id number] No Take 1 tablet (5 mg total) by mouth 3 (three) times daily with meals. Darliss Rogue, MD Past Week Active Pharmacy Records, Other  MOUNJARO  10 MG/0.5ML Pen [id number]  [facility] THE CONTENTS OF ONE PEN  SUBCUTANEOUSLY WEEKLY AS  DIRECTED Edman Marsa PARAS, DO  Active Pharmacy Records, Other  naloxone  (NARCAN ) nasal spray 4 mg/0.1 mL [id number]  Place 1 spray into the nose once.  Patient not taking: Reported on 10/30/2023   [provider]  Active Pharmacy Records, Other  nicotine  (NICODERM CQ  - DOSED IN MG/24 HOURS) 14 mg/24hr patch [id number]  Place onto the skin.  Patient not taking: Reported on 10/27/2023   [provider]  Active Pharmacy Records, Other           Med Note [facility] BURNARD GORMAN Pablo Oct 19, 2023  7:07 AM)    nystatin  (MYCOSTATIN /NYSTOP ) powder [id number]  Apply 1 Application topically 3 (three) times daily. Moishe Chiquita HERO, NP  Active Pharmacy Records, Other  ondansetron  (ZOFRAN -ODT) 4 MG disintegrating tablet [id number]  DISSOLVE 1 TABLET ON THE TONGUE EVERY 8 HOURS AS NEEDED FOR NAUSEA AND VOMITING Edman Marsa PARAS, DO  Active Pharmacy Records, Other  oxyCODONE -acetaminophen  (PERCOCET) 10-325 MG tablet [id number]  Take 1 tablet by mouth every 6 (six) hours as needed for pain. [provider]  Active Pharmacy Records, Other  pantoprazole  (PROTONIX ) 40 MG tablet [id number]  TAKE 1 TABLET BY MOUTH  DAILY AT  9 AM Karamalegos, Marsa PARAS, DO  Active Pharmacy Records, Other  rizatriptan  (MAXALT -MLT) 10 MG disintegrating tablet [id number]  Take 1 tablet (10 mg total) by mouth as needed. May repeat in 2 hours if needed Edman Marsa PARAS, DO  Active Pharmacy Records, Other  TRELEGY ELLIPTA  100-62.5-25 MCG/ACT AEPB [id number]  USE 1 INHALATION BY MOUTH ONCE  DAILY AT THE SAME TIME EACH DAY Edman Marsa PARAS, DO  Active Pharmacy Records, Other  Vitamin D , Ergocalciferol , (DRISDOL ) 1.25 MG (50000 UNIT) CAPS capsule [id number]  TAKE 1 CAPSULE BY MOUTH EVERY WEDNESDAY @9AM   Patient not taking: Reported on 10/30/2023   Edman Marsa PARAS, DO  Active Pharmacy Records, Other  Med List Note Roberta Breen, [facility] 10/23/23 1301): Patient was discharged from [facility] on 10-20-2023            Recommendation:   Continue Current Plan of Care  Follow Up Plan:   Telephone follow-up in 1 week  Medford Balboa, BSN, RN [place]  VBCI - [facility] RN Care Manager [phone]

## 2023-11-03 NOTE — Patient Instructions (Signed)
 Visit Information  Thank you for taking time to visit with me today. Please don't hesitate to contact me if I can be of assistance to you before our next scheduled telephone appointment.  Our next appointment is by telephone on October 28th at 3:00pm  Following is a copy of your care plan:   Goals Addressed             This Visit's Progress    VBCI Transitions of Care (TOC) Care Plan       Problems: (reviewed 11/03/23) Recent Hospitalization for treatment of CAD Hospital or ED Adm Risk 85%  Goal: (reviewed 11/03/23) Over the next 30 days, the patient will not experience hospital readmission  Interventions: (reviewed 11/03/23)  CAD Interventions: Assessed understanding of CAD diagnosis Medications reviewed including medications utilized in CAD treatment plan Provided education on Importance of limiting foods high in cholesterol Counseled on importance of regular laboratory monitoring as prescribed Reviewed Importance of taking all medications as prescribed Reviewed Importance of attending all scheduled provider appointments Assessed social determinant of health barriers Recommended smoking cessation - the patient is not wearing the nicotine  patch Follow up with Home Health RN, PT through [facility] 11/04/23 Referral with [facility]. Lane at [facility] December 14, 2023  Patient Self Care Activities: (reviewed 11/03/23) Attend all scheduled provider appointments Call pharmacy for medication refills 3-7 days in advance of running out of medications Call provider office for new concerns or questions  Notify RN Care Manager of [facility] call rescheduling needs Participate in Transition of Care Program/Attend [facility] scheduled calls Take medications as prescribed    Plan:  Telephone follow up appointment with care management team member scheduled for:  Tuesday October 28th at 3:00pm        Patient verbalizes understanding of instructions and care plan provided today and agrees  to view in MyChart. Active MyChart status and patient understanding of how to access instructions and care plan via MyChart confirmed with patient.     The patient has been provided with contact information for the care management team and has been advised to call with any health related questions or concerns.   Please call the care guide team at [phone] if you need to cancel or reschedule your appointment.   Please call the Suicide and Crisis Lifeline: 988 call the [place]  National Suicide Prevention Lifeline: 226-423-3647 or TTY: 401-106-8498 TTY 386-643-5598) to talk to a trained counselor if you are experiencing a Mental Health or Behavioral Health Crisis or need someone to talk to.  Medford Balboa, BSN, RN [place]  VBCI - [employer] [facility] RN Care Manager [phone]

## 2023-11-03 NOTE — Addendum Note (Signed)
 Addended by: EDMAN MARSA PARAS on: 11/03/2023 05:42 PM   Modules accepted: Orders

## 2023-11-04 ENCOUNTER — Ambulatory Visit: Payer: Self-pay | Admitting: Family Medicine

## 2023-11-04 DIAGNOSIS — M542 Cervicalgia: Secondary | ICD-10-CM | POA: Diagnosis not present

## 2023-11-04 DIAGNOSIS — R0789 Other chest pain: Secondary | ICD-10-CM | POA: Diagnosis not present

## 2023-11-04 DIAGNOSIS — M5416 Radiculopathy, lumbar region: Secondary | ICD-10-CM | POA: Diagnosis not present

## 2023-11-04 DIAGNOSIS — M25571 Pain in right ankle and joints of right foot: Secondary | ICD-10-CM | POA: Diagnosis not present

## 2023-11-04 DIAGNOSIS — Z9181 History of falling: Secondary | ICD-10-CM | POA: Diagnosis not present

## 2023-11-04 DIAGNOSIS — Z79899 Other long term (current) drug therapy: Secondary | ICD-10-CM | POA: Diagnosis not present

## 2023-11-04 DIAGNOSIS — G8929 Other chronic pain: Secondary | ICD-10-CM | POA: Diagnosis not present

## 2023-11-04 NOTE — Telephone Encounter (Signed)
 Pt called back to complete triage: instructed pt that her triage nurse stated she should be seen w/n 4 hours: pt stated bed bound due to stroke therefore she can not travel.  Pt stated worsening pain.  Pt has refill that can be called in and taken on Friday: nurse instructed pt to call in rx now and take on Friday, increase water  intake and take OTC pain medication : if no improvement call us  back.  Pt currently using depends for bowel and bladder: education giving to pt r/t removing soiled items as quickly as possible, cleaning and drying area. Will route information to PCP for review.

## 2023-11-04 NOTE — Telephone Encounter (Signed)
 FYI Only or Action Required?: FYI only for provider.  Patient was last seen in primary care on 10/30/2023 by Edman Marsa PARAS, DO.  Called Nurse Triage reporting Vaginitis.  Symptoms began a week ago.  Interventions attempted: Prescription medications: fluconazole  (DIFLUCAN ) 150 MG tablet.  Symptoms are: unchanged.  Triage Disposition: See HCP Within 4 Hours (Or PCP Triage)  Patient/caregiver understands and will follow disposition?: Unsure                  Copied from CRM [id number]. Topic: Clinical - Red Word Triage >> Nov 04, 2023  4:39 PM Selinda RAMAN wrote: Red Word that prompted transfer to Nurse Triage: The patient called in stating she saw her provider on Friday for a bad yeast infection and was prescribed fluconazole  (DIFLUCAN ) 150 MG tablet. She says she is no better and may be even worse. She is very uncomfortable and has bad burning. I will transfer her to E2C2 NT Reason for Disposition  [1] SEVERE vaginal pain AND [2] not improved 2 hours after pain medicine  Answer Assessment - Initial Assessment Questions Pt was seen in office on 10/17 and prescribed fluconazole  for a yeast infection. Pt reports symptoms have not gotten better and, if anything, have worsened. Pt states she is bedridden, making it very difficult to come in for an appointment. This RN recommends pt be evaluated within 4 hours. No virtual urgent care appointments are available today. Pt was advised to call an ambulance to ED if she is unable to get a friend or neighbor to take her to urgent care as pt states she is at her wits end with these symptoms. The call dropped in middle of call. This RN made first attempt to contact pt with no answer. A voicemail was left with call back number provided.  Symptoms: Severe itching and burning Pain: 8/10, constant Denies foul odor or abnormal discharge  Protocols used: Vaginal Symptoms-A-AH

## 2023-11-05 ENCOUNTER — Ambulatory Visit: Admitting: Family Medicine

## 2023-11-05 ENCOUNTER — Telehealth: Admitting: Family Medicine

## 2023-11-05 ENCOUNTER — Telehealth: Payer: Self-pay

## 2023-11-05 NOTE — Telephone Encounter (Signed)
 Copied from CRM [id number]. Topic: Clinical - Home Health Verbal Orders >> Nov 05, 2023  2:26 PM Hadassah PARAS wrote: Caller/Agency: Clinical Manager from  [facility] Callback Number: [phone] Service Requested: Physical Therapy, Occupational Therapy Frequency: discharge Any new concerns about the patient? Yes, home is roach infested, dog feces, chronic smoker no intention on quitting, stated pt does not want to do this anymore. Has not walked for more than 2 years. Pt is not progressing and is now discharging her

## 2023-11-10 ENCOUNTER — Other Ambulatory Visit: Payer: Self-pay

## 2023-11-11 ENCOUNTER — Telehealth: Payer: Self-pay

## 2023-11-11 NOTE — Telephone Encounter (Signed)
 Copied from CRM #[id number]. Topic: General - Other >> Nov 11, 2023  4:07 PM Fonda T wrote: Reason for CRM: Patient calling to check status of hospital bed she requested.   Patient reports the bed she has at the present, is uncomfortable and all the padding has gone.  Patient can be reached back at [phone], for follow up call.

## 2023-11-11 NOTE — Telephone Encounter (Signed)
 I am routing this call over to [facility] [place] RN for assistance. We have been working on this issue already and the last conclusion that I am aware of is that the new hospital bed / mattress would not be covered due to the timing of the last hospital bed she received.  Medford, I see you have been trying to reach the patient Tues / Weds - do you need our office to attempt again to relay this information?  I believe you were looking into other options for her, so ideally if you are able to connect with her - can you share that we did try to address her need and order the bed, but unfortunately it is denied.  Thanks!  Marsa Officer, DO [facility] 11/11/2023, 5:34 PM

## 2023-11-12 ENCOUNTER — Ambulatory Visit: Admitting: Student in an Organized Health Care Education/Training Program

## 2023-11-12 ENCOUNTER — Telehealth: Payer: Self-pay

## 2023-11-12 NOTE — Telephone Encounter (Signed)
 Copied from CRM [id number]. Topic: General - Call Back - No Documentation >> Nov 12, 2023  3:04 PM Olam RAMAN wrote: Reason for CRM: Pt was calling for her social worker and stated she needs a new bed. I advised per notes on mychart shows: She is NOT eligible for a new bed or mattress because these exchanges happened within the last 6 months. Pt was upset and stated she needs a new bed and keeps getting told different things  CB [phone]

## 2023-11-12 NOTE — Telephone Encounter (Signed)
 Routing chart for review. FYI if Consuelo Counter calls back please share this information and contact # for [facility]. Thank you!

## 2023-11-13 LAB — CULTURE, FUNGUS WITHOUT SMEAR: Special Requests: NORMAL

## 2023-11-16 NOTE — Telephone Encounter (Signed)
 Left message for patient to return call OK  to give patient Medford' number when she calls back

## 2023-11-16 NOTE — Progress Notes (Signed)
 [name]                                          MRN: [id number]   11/16/2023   The VBCI Quality Team Specialist reviewed this patient medical record for the purposes of chart review for care gap closure. The following were reviewed: abstraction for care gap closure-glycemic status assessment.    VBCI Quality Team

## 2023-11-18 NOTE — Transitions of Care (Post Inpatient/ED Visit) (Signed)
 11/18/2023  Patient ID: [name], female   DOB: 1965/05/22, 58 y.o.   MRN: [id number]  CM was able to connect with the patient today and explain her hospital bed and mattress process. The DME company states she did receive a mattress 6 months ago which the patient denies. She states she cannot afford an eggcrate mattress or a mattress pad to put on her bed. Unsure what other options are available to her. CM to use Find Help resources.  Medford Balboa, BSN, RN [place]  VBCI - [employer] [facility] RN Care Manager [phone]

## 2023-11-19 ENCOUNTER — Ambulatory Visit: Payer: Self-pay

## 2023-11-19 ENCOUNTER — Telehealth: Admitting: Physician Assistant

## 2023-11-19 DIAGNOSIS — H1033 Unspecified acute conjunctivitis, bilateral: Secondary | ICD-10-CM | POA: Diagnosis not present

## 2023-11-19 DIAGNOSIS — B3731 Acute candidiasis of vulva and vagina: Secondary | ICD-10-CM | POA: Diagnosis not present

## 2023-11-19 NOTE — Patient Instructions (Addendum)
 [name], thank you for joining Kyser Wandel, PA-C for today's virtual visit.  While this provider is not your primary care provider (PCP), if your PCP is located in our provider database this encounter information will be shared with them immediately following your visit.   A [place] MyChart account gives you access to today's visit and all your visits, tests, and labs performed at [facility]  click here if you don't have a [place] MyChart account or go to [email or web address]  Consent: (Patient) [name] provided verbal consent for this virtual visit at the beginning of the encounter.  Current Medications:  Current Outpatient Medications:    albuterol  (VENTOLIN  HFA) 108 (90 Base) MCG/ACT inhaler, INHALE 2 INHALATIONS BY MOUTH  EVERY 4 HOURS AS NEEDED FOR  WHEEZING OR FOR SHORTNESS OF  BREATH, Disp: 51 g, Rfl: 1   atorvastatin  (LIPITOR) 40 MG tablet, TAKE 1 TABLET BY MOUTH AT  BEDTIME, Disp: 90 tablet, Rfl: 3   citalopram  (CELEXA ) 40 MG tablet, TAKE 1 TABLET BY MOUTH DAILY, Disp: 100 tablet, Rfl: 1   clopidogrel  (PLAVIX ) 75 MG tablet, Take 1 tablet (75 mg total) by mouth daily., Disp: , Rfl:    fluconazole  (DIFLUCAN ) 150 MG tablet, Take one tablet by mouth on Day 1. Repeat dose 2nd tablet on Day 3., Disp: 2 tablet, Rfl: 1   fluticasone  (FLONASE ) 50 MCG/ACT nasal spray, Place 2 sprays into both nostrils daily. Use for 4-6 weeks then stop and use seasonally or as needed., Disp: 16 g, Rfl: 0   gabapentin  (NEURONTIN ) 600 MG tablet, TAKE 1 TABLET BY MOUTH 4 TIMES  DAILY, Disp: 400 tablet, Rfl: 2   leptospermum manuka honey (MEDIHONEY) gel, Apply 1 Application topically daily. (Patient not taking: Reported on 10/27/2023), Disp: 44 mL, Rfl: 0   levETIRAcetam (KEPPRA) 500 MG tablet, Take 1 tablet (500 mg total) by mouth 2 (two) times daily., Disp: 180 tablet, Rfl: 1   midodrine  (PROAMATINE ) 5 MG tablet, Take 1 tablet (5 mg total) by mouth 3 (three) times daily  with meals., Disp: 21 tablet, Rfl: 0   MOUNJARO  10 MG/0.5ML Pen, INJECT THE CONTENTS OF ONE PEN  SUBCUTANEOUSLY WEEKLY AS  DIRECTED, Disp: 6 mL, Rfl: 3   naloxone  (NARCAN ) nasal spray 4 mg/0.1 mL, Place 1 spray into the nose once. (Patient not taking: Reported on 10/30/2023), Disp: , Rfl:    nicotine  (NICODERM CQ  - DOSED IN MG/24 HOURS) 14 mg/24hr patch, Place onto the skin. (Patient not taking: Reported on 10/27/2023), Disp: , Rfl:    nystatin  (MYCOSTATIN /NYSTOP ) powder, Apply 1 Application topically 3 (three) times daily., Disp: 15 g, Rfl: 0   ondansetron  (ZOFRAN -ODT) 4 MG disintegrating tablet, DISSOLVE 1 TABLET ON THE TONGUE EVERY 8 HOURS AS NEEDED FOR NAUSEA AND VOMITING, Disp: 30 tablet, Rfl: 2   oxyCODONE -acetaminophen  (PERCOCET) 10-325 MG tablet, Take 1 tablet by mouth every 6 (six) hours as needed for pain., Disp: , Rfl:    pantoprazole  (PROTONIX ) 40 MG tablet, TAKE 1 TABLET BY MOUTH DAILY AT  9 AM, Disp: 100 tablet, Rfl: 1   rizatriptan  (MAXALT -MLT) 10 MG disintegrating tablet, Take 1 tablet (10 mg total) by mouth as needed. May repeat in 2 hours if needed, Disp: 10 tablet, Rfl: 2   TRELEGY ELLIPTA  100-62.5-25 MCG/ACT AEPB, USE 1 INHALATION BY MOUTH ONCE  DAILY AT THE SAME TIME EACH DAY, Disp: 180 each, Rfl: 3   Vitamin D , Ergocalciferol , (DRISDOL ) 1.25 MG (50000 UNIT) CAPS capsule, TAKE 1 CAPSULE BY  MOUTH EVERY WEDNESDAY @9AM  (Patient not taking: Reported on 10/30/2023), Disp: 4 capsule, Rfl: 11   Medications ordered in this encounter:  No orders of the defined types were placed in this encounter.    *If you need refills on other medications prior to your next appointment, please contact your pharmacy*  Follow-Up: Call back or seek an in-person evaluation if the symptoms worsen or if the condition fails to improve as anticipated.  [place] [facility] [phone]  Other Instructions  Please to the emergency room if any new or worsening symptoms  Please seek an in-person  evaluation if the symptoms worsen or if the condition fails to improve as anticipated.  PCP follow-up in 5-7 days   If you have been instructed to have an in-person evaluation today at a local Urgent Care facility, please use the link below. It will take you to a list of all of our available [facility], including address, phone number and hours of operation. Please do not delay care.  [facility]  If you or a family member do not have a primary care provider, use the link below to schedule a visit and establish care. When you choose a [place] primary care physician or advanced practice provider, you gain a long-term partner in health. Find a Primary Care Provider  Learn more about [place]'s in-office and virtual care options: [place] - Get Care Now

## 2023-11-19 NOTE — Telephone Encounter (Signed)
 FYI Only or Action Required?: FYI only for provider: telehealth today.  Patient was last seen in primary care on 10/30/2023 by Edman Marsa PARAS, DO.  Called Nurse Triage reporting Conjunctivitis.  Symptoms began several days ago.  Interventions attempted: Other: warm compresses with relief.  Symptoms are: unchanged.  Triage Disposition: See HCP Within 4 Hours (Or PCP Triage)  Patient/caregiver understands and will follow disposition?:         Copied from CRM #[id number]. Topic: Clinical - Red Word Triage >> Nov 19, 2023  1:00 PM Joesph NOVAK wrote: Red Word that prompted transfer to Nurse Triage: Patient wants to set up a virtual appt, she has a eye problem, possible pink eye. Discharge, crust, pain in her eyes. When she wakes up, her eyes are sealed shut. Started in her left eye and spread to her right eye. Terrible headache. Reason for Disposition  MODERATE eye pain (e.g., interferes with normal activities)    PCP had no access, scheduled with Telehealth  Answer Assessment - Initial Assessment Questions 1. EYE DISCHARGE: Is the discharge in one or both eyes? What color is it? How much is there? When did the discharge start?      both 2. REDNESS OF SCLERA: Is there redness in the white of the eye? If Yes, ask: Is it in one or both eyes? When did the redness start?     yes 3. EYELIDS: Are the eyelids red or swollen? If Yes, ask: How much?      red 4. VISION: Do you have blurred vision?     Some blurred vision 5. PAIN: Is there any pain? If Yes, ask: How bad is the pain? (Scale 0-10; or none, mild, moderate, severe)     Yes, very painful 6. CONTACT LENS: Do you wear contacts?     *No Answer* 7. OTHER SYMPTOMS: Do you have any other symptoms? (e.g., fever, runny nose, cough)     H/A 8. PREGNANCY: Is there any chance you are pregnant? When was your last menstrual period?     N/a  Protocols used: Eye - Pus or Discharge-A-AH

## 2023-11-19 NOTE — Progress Notes (Signed)
 Virtual Visit Consent   [name], you are scheduled for a virtual visit with a [facility] provider today. Just as with appointments in the office, your consent must be obtained to participate. Your consent will be active for this visit and any virtual visit you may have with one of our providers in the next 365 days. If you have a MyChart account, a copy of this consent can be sent to you electronically.  As this is a virtual visit, video technology does not allow for your provider to perform a traditional examination. This may limit your provider's ability to fully assess your condition. If your provider identifies any concerns that need to be evaluated in person or the need to arrange testing (such as labs, EKG, etc.), we will make arrangements to do so. Although advances in technology are sophisticated, we cannot ensure that it will always work on either your end or our end. If the connection with a video visit is poor, the visit may have to be switched to a telephone visit. With either a video or telephone visit, we are not always able to ensure that we have a secure connection.  By engaging in this virtual visit, you consent to the provision of healthcare and authorize for your insurance to be billed (if applicable) for the services provided during this visit. Depending on your insurance coverage, you may receive a charge related to this service.  I need to obtain your verbal consent now. Are you willing to proceed with your visit today? [name] has provided verbal consent on 11/19/2023 for a virtual visit (video or telephone). Deanna Washko, PA-C  Date: 11/19/2023 3:48 PM   Virtual Visit via Video Note   I, [name], connected with  [name]  ([id number], [birth year]) on 11/19/23 at  3:45 PM EST by a video-enabled telemedicine application and verified that I am speaking with the correct person using two identifiers.  Location: Patient: Virtual Visit Location Patient:  Home Provider: Virtual Visit Location Provider: Home Office   I discussed the limitations of evaluation and management by telemedicine and the availability of in person appointments. The patient expressed understanding and agreed to proceed.    History of Present Illness: [name] is a 58 y.o. who identifies as a female who was assigned female at birth, and is being seen today for pinkeye.  HPI: Patient reports drainage and crusting of the left eye that started a few days ago, now affecting her right eye as well.  She reports redness, draining, crusting in the mornings.  Patient does use glasses, does not wear lenses.  No known sick contacts.  Of note patient reports that she was in the hospital a few days ago due to seizure with onset of symptoms since she returned home.  Patient also reports possible vaginal yeast infection, reports thick white discharge with itching.  No urinary symptoms.  Patient has had similar symptoms in the past and was treated for yeast infection.  She admits that while she was in the emergency room admitted she was treated with antibiotics as well.     Problems:  Patient Active Problem List   Diagnosis Date Noted   Seizure (HCC) 10/23/2023   Abdominal pain 10/19/2023   Splenic infarction 10/19/2023   Hemiplegia affecting dominant side, post-stroke (HCC) 06/24/2023   Pressure injury of skin 06/03/2023   Hypotension 06/03/2023   GERD (gastroesophageal reflux disease)    Anxiety    CVA (cerebral vascular accident) (  HCC) 06/02/2023   Acute osteomyelitis of toe of left foot (HCC) 03/26/2023   Osteomyelitis of left foot (HCC) 03/25/2023   Type 2 diabetes mellitus with peripheral vascular disease (HCC) 03/25/2023   PVD (peripheral vascular disease) 03/25/2023   HTN (hypertension) 03/25/2023   Chronic right hip pain 05/06/2022   Chronic pain 01/14/2022   Right sided weakness 12/18/2021   Hypokalemia 12/10/2021   High anion gap metabolic acidosis 12/09/2021    Obesity (BMI 30-39.9) 12/09/2021   Hemiparesis of right dominant side as late effect of cerebrovascular disease (HCC) 09/21/2020   Vitamin D  deficiency 08/22/2020   Urinary incontinence 07/27/2020   Centrilobular emphysema (HCC) 07/10/2020   Moderate episode of recurrent major depressive disorder (HCC) 07/10/2020   Chronic pain syndrome 07/10/2020   Intractable chronic migraine without aura and with status migrainosus 07/10/2020   Chronic hypoxemic respiratory failure (HCC) 09/23/2017   Pulmonary nodules 09/23/2017   Trimalleolar fracture 08/09/2017   TIA (transient ischemic attack) 04/07/2017   Depression 04/07/2017   History of cerebrovascular accident (CVA) with residual deficit    Complicated migraine    Brain TIA 10/11/2012   Hyperlipidemia 10/11/2012   Nocturnal hypoxemia 10/11/2012   Diabetes mellitus without complication (HCC) 10/11/2012   Tobacco abuse 10/11/2012   COPD (chronic obstructive pulmonary disease) (HCC) 10/11/2012    Allergies:  Allergies  Allergen Reactions   Tramadol Nausea And Vomiting and Hypertension   Augmentin [Amoxicillin-Pot Clavulanate] Rash    Tolerated Zosyn  03/26/23   Medications:  Current Outpatient Medications:    albuterol  (VENTOLIN  HFA) 108 (90 Base) MCG/ACT inhaler, INHALE 2 INHALATIONS BY MOUTH  EVERY 4 HOURS AS NEEDED FOR  WHEEZING OR FOR SHORTNESS OF  BREATH, Disp: 51 g, Rfl: 1   atorvastatin  (LIPITOR) 40 MG tablet, TAKE 1 TABLET BY MOUTH AT  BEDTIME, Disp: 90 tablet, Rfl: 3   citalopram  (CELEXA ) 40 MG tablet, TAKE 1 TABLET BY MOUTH DAILY, Disp: 100 tablet, Rfl: 1   clopidogrel  (PLAVIX ) 75 MG tablet, Take 1 tablet (75 mg total) by mouth daily., Disp: , Rfl:    fluconazole  (DIFLUCAN ) 150 MG tablet, Take one tablet by mouth on Day 1. Repeat dose 2nd tablet on Day 3., Disp: 2 tablet, Rfl: 1   fluticasone  (FLONASE ) 50 MCG/ACT nasal spray, Place 2 sprays into both nostrils daily. Use for 4-6 weeks then stop and use seasonally or as needed.,  Disp: 16 g, Rfl: 0   gabapentin  (NEURONTIN ) 600 MG tablet, TAKE 1 TABLET BY MOUTH 4 TIMES  DAILY, Disp: 400 tablet, Rfl: 2   leptospermum manuka honey (MEDIHONEY) gel, Apply 1 Application topically daily. (Patient not taking: Reported on 10/27/2023), Disp: 44 mL, Rfl: 0   levETIRAcetam (KEPPRA) 500 MG tablet, Take 1 tablet (500 mg total) by mouth 2 (two) times daily., Disp: 180 tablet, Rfl: 1   midodrine  (PROAMATINE ) 5 MG tablet, Take 1 tablet (5 mg total) by mouth 3 (three) times daily with meals., Disp: 21 tablet, Rfl: 0   MOUNJARO  10 MG/0.5ML Pen, INJECT THE CONTENTS OF ONE PEN  SUBCUTANEOUSLY WEEKLY AS  DIRECTED, Disp: 6 mL, Rfl: 3   naloxone  (NARCAN ) nasal spray 4 mg/0.1 mL, Place 1 spray into the nose once. (Patient not taking: Reported on 10/30/2023), Disp: , Rfl:    nicotine  (NICODERM CQ  - DOSED IN MG/24 HOURS) 14 mg/24hr patch, Place onto the skin. (Patient not taking: Reported on 10/27/2023), Disp: , Rfl:    nystatin  (MYCOSTATIN /NYSTOP ) powder, Apply 1 Application topically 3 (three) times daily., Disp: 15 g,  Rfl: 0   ondansetron  (ZOFRAN -ODT) 4 MG disintegrating tablet, DISSOLVE 1 TABLET ON THE TONGUE EVERY 8 HOURS AS NEEDED FOR NAUSEA AND VOMITING, Disp: 30 tablet, Rfl: 2   oxyCODONE -acetaminophen  (PERCOCET) 10-325 MG tablet, Take 1 tablet by mouth every 6 (six) hours as needed for pain., Disp: , Rfl:    pantoprazole  (PROTONIX ) 40 MG tablet, TAKE 1 TABLET BY MOUTH DAILY AT  9 AM, Disp: 100 tablet, Rfl: 1   rizatriptan  (MAXALT -MLT) 10 MG disintegrating tablet, Take 1 tablet (10 mg total) by mouth as needed. May repeat in 2 hours if needed, Disp: 10 tablet, Rfl: 2   TRELEGY ELLIPTA  100-62.5-25 MCG/ACT AEPB, USE 1 INHALATION BY MOUTH ONCE  DAILY AT THE SAME TIME EACH DAY, Disp: 180 each, Rfl: 3   Vitamin D , Ergocalciferol , (DRISDOL ) 1.25 MG (50000 UNIT) CAPS capsule, TAKE 1 CAPSULE BY MOUTH EVERY WEDNESDAY @9AM  (Patient not taking: Reported on 10/30/2023), Disp: 4 capsule, Rfl:  11  Observations/Objective: Patient is well-developed, well-nourished in no acute distress.  Resting comfortable at home.  Head is normocephalic, atraumatic.  Bilateral eyes: Conjunctiva is injected, mild crusting noted at the eyelashes, no periorbital erythema or edema noted extraocular movements intact through video visit No labored breathing.  Speech is clear and coherent with logical content.  Patient is alert and oriented at baseline.    Assessment and Plan: 1. Acute bacterial conjunctivitis of both eyes (Primary)  2. Vaginal yeast infection  Clinical suspicion for bilateral bacterial conjunctivitis will treat with topical eyedrops  Based on history provided possible yeast infection: Chart review does show that patient was treated for yeast infection on October 20 by her PCP and it appears that she does have 1 remaining refill for the fluconazole .  Follow Up Instructions: I discussed the assessment and treatment plan with the patient. The patient was provided an opportunity to ask questions and all were answered. The patient agreed with the plan and demonstrated an understanding of the instructions.  A copy of instructions were sent to the patient via MyChart unless otherwise noted below.    The patient was advised to call back or seek an in-person evaluation if the symptoms worsen or if the condition fails to improve as anticipated.    Christiano Blandon, PA-C

## 2023-11-20 ENCOUNTER — Other Ambulatory Visit: Payer: Self-pay | Admitting: Family Medicine

## 2023-11-20 DIAGNOSIS — B379 Candidiasis, unspecified: Secondary | ICD-10-CM

## 2023-11-20 MED ORDER — MOXIFLOXACIN HCL 0.5 % OP SOLN: 1.0000 [drp] | Freq: Three times a day (TID) | OPHTHALMIC | 0 refills | Status: DC

## 2023-11-20 NOTE — Addendum Note (Signed)
 Addended by: VIVIENNE DELON HERO on: 11/20/2023 06:31 PM   Modules accepted: Orders

## 2023-11-20 NOTE — Addendum Note (Signed)
 Addended by: VIVIENNE DELON HERO on: 11/20/2023 06:32 PM   Modules accepted: Orders

## 2023-11-21 NOTE — Telephone Encounter (Signed)
 Requested medication (s) are due for refill today: Yes  Requested medication (s) are on the active medication list: Yes  Last refill:  03/02/23  Future visit scheduled: Yes  Notes to clinic:  Unable to refill due to no refill protocol for this medication.      Requested Prescriptions  Pending Prescriptions Disp Refills   fluconazole  (DIFLUCAN ) 150 MG tablet [Pharmacy Med Name: FLUCONAZOLE  150 MG TAB] 2 tablet 1    Sig: TAKE 1 TABLET BY MOUTH ON DAY 1 . REPEAT2ND DOSE ON DAY 3     Off-Protocol Failed - 11/21/2023 11:17 AM      Failed - Medication not assigned to a protocol, review manually.      Passed - Valid encounter within last 12 months    Recent Outpatient Visits           3 weeks ago Seizure [facility])   [facility] [place], Marsa PARAS, DO   1 month ago Lingular pneumonia   [place] [facility] [place] Marsa PARAS, DO   5 months ago Cryptogenic stroke [facility])   [place] [facility] Edman Marsa PARAS, DO   8 months ago Acute conjunctivitis of both eyes, unspecified acute conjunctivitis type   [facility] [place], Marsa PARAS, [place]

## 2023-11-25 ENCOUNTER — Encounter: Payer: Self-pay | Admitting: Family Medicine

## 2023-11-25 ENCOUNTER — Telehealth: Payer: Self-pay

## 2023-11-25 ENCOUNTER — Telehealth (INDEPENDENT_AMBULATORY_CARE_PROVIDER_SITE_OTHER): Admitting: Family Medicine

## 2023-11-25 ENCOUNTER — Ambulatory Visit: Admitting: Family Medicine

## 2023-11-25 DIAGNOSIS — B9689 Other specified bacterial agents as the cause of diseases classified elsewhere: Secondary | ICD-10-CM

## 2023-11-25 DIAGNOSIS — H109 Unspecified conjunctivitis: Secondary | ICD-10-CM | POA: Diagnosis not present

## 2023-11-25 MED ORDER — POLYMYXIN B-TRIMETHOPRIM 10000-0.1 UNIT/ML-% OP SOLN: 1.0000 [drp] | Freq: Four times a day (QID) | OPHTHALMIC | 0 refills | Status: DC

## 2023-11-25 NOTE — Telephone Encounter (Signed)
 Patient was seen.

## 2023-11-25 NOTE — Progress Notes (Signed)
 Subjective:    Patient ID: [name], female    DOB: 02-Nov-1965, 58 y.o.   MRN: [id number]  [name] is a 58 y.o. female presenting on 11/25/2023 for Conjunctivitis and Vaginitis   Virtual / Telehealth Encounter - Video Visit via MyChart The purpose of this virtual visit is to provide medical care while limiting exposure to the novel coronavirus (COVID19) for both patient and office staff.  Consent was obtained for remote visit:  Yes.   Answered questions that patient had about telehealth interaction:  Yes.   I discussed the limitations, risks, security and privacy concerns of performing an evaluation and management service by video/telephone. I also discussed with the patient that there may be a patient responsible charge related to this service. The patient expressed understanding and agreed to proceed.  Patient Location: Home Provider Location: [facility] (Office)  Participants in virtual visit: - Patient: [name] - CMA: Alan Fontana CMA - Provider: Dr Edman   HPI  Discussed the use of AI scribe software for clinical note transcription with the patient, who gave verbal consent to proceed.  History of Present Illness   [name] is a 58 year old female who presents with persistent eye pain and discharge despite antibiotic treatment.  Suspected Bacterial Conjunctivitis Ocular pain and irritation - Significant pain and irritation in both eyes since at least November 19, 2023. She had virtual visit at that time. Given moxifloxacin eye drops. - Symptoms alternate between pain and persistent irritation - No improvement with moxifloxacin eye drops used three times daily for seven days  Ocular discharge and crusting - Eyes sealed shut with crusty discharge upon waking, indicating persistent drainage - Crusting and discharge have persisted despite antibiotic therapy  Response to ophthalmic treatments - Current treatment with moxifloxacin  eye drops has not improved symptoms - Previous use of Polytrim  eye drops in April or May was more effective than current treatment - No prior use of eye ointment and unfamiliarity with erythromycin ointment  Vulvovaginal symptoms - Pending prescription for Diflucan  for a yeast infection, not yet picked up from pharmacy             06/23/2023    2:47 PM 05/22/2023    3:28 PM 05/16/2022    3:34 PM  Depression screen PHQ 2/9  Decreased Interest 1 2 1   Down, Depressed, Hopeless 1 2 1   PHQ - 2 Score 2 4 2   Altered sleeping 3  1  Tired, decreased energy 3 2 1   Change in appetite 2  0  Feeling bad or failure about yourself  2  0  Trouble concentrating 3  0  Moving slowly or fidgety/restless 3  0  Suicidal thoughts 0  0  PHQ-9 Score 18   4   Difficult doing work/chores Somewhat difficult Somewhat difficult Somewhat difficult     Data saved with a previous flowsheet row definition       06/23/2023    2:47 PM 05/06/2022    2:44 PM 05/15/2021    1:59 PM 04/17/2021    2:35 PM  GAD 7 : Generalized Anxiety Score  Nervous, Anxious, on Edge 1 1 2 2   Control/stop worrying 2 1 0 0  Worry too much - different things 0 1 2 1   Trouble relaxing 3 1 3 3   Restless 0 1 3 3   Easily annoyed or irritable 3 2 3 3   Afraid - awful might happen 0 0 0 0  Total GAD 7 Score 9 7 13 12   Anxiety Difficulty Not difficult at all Not difficult at all Somewhat difficult Not difficult at all    Social History   Tobacco Use   Smoking status: Every Day    Current packs/day: 2.00    Average packs/day: 2.0 packs/day for 46.9 years (93.7 ttl pk-yrs)    Types: Cigarettes    Start date: 1979   Smokeless tobacco: Never  Vaping Use   Vaping status: Never Used  Substance Use Topics   Alcohol  use: No   Drug use: Yes    Types: Marijuana, Oxycodone     Comment: last smoked 2 days ago  8/4    Review of Systems Per HPI unless specifically indicated above     Objective:    There were no vitals taken for this  visit.  Wt Readings from Last 3 Encounters:  10/30/23 200 lb (90.7 kg)  10/22/23 198 lb 6.6 oz (90 kg)  10/19/23 200 lb (90.7 kg)     Physical Exam  Note examination was completely remotely via video observation objective data only  Gen - well-appearing, no acute distress or apparent pain, comfortable HEENT - eyes appear to have conjunctival injection bilateral with difficult to see if discharge. No erythema or edema of eyelids or surrounding periorbital region Heart/Lungs - cannot examine virtually - observed no evidence of coughing or labored breathing. Abd - cannot examine virtually  Skin - face visible today- no rash Neuro - awake, alert, oriented Psych - not anxious appearing   Results for orders placed or performed during the hospital encounter of 10/22/23  CBC with Differential   Collection Time: 10/22/23 10:35 PM  Result Value Ref Range   WBC 10.8 (H) 4.0 - 10.5 K/uL   RBC 4.99 3.87 - 5.11 MIL/uL   Hemoglobin 14.0 12.0 - 15.0 g/dL   HCT 57.2 63.9 - 53.9 %   MCV 85.6 80.0 - 100.0 fL   MCH 28.1 26.0 - 34.0 pg   MCHC 32.8 30.0 - 36.0 g/dL   RDW 85.3 88.4 - 84.4 %   Platelets 340 150 - 400 K/uL   nRBC 0.2 0.0 - 0.2 %   Neutrophils Relative % 80 %   Neutro Abs 8.6 (H) 1.7 - 7.7 K/uL   Lymphocytes Relative 14 %   Lymphs Abs 1.5 0.7 - 4.0 K/uL   Monocytes Relative 5 %   Monocytes Absolute 0.6 0.1 - 1.0 K/uL   Eosinophils Relative 0 %   Eosinophils Absolute 0.0 0.0 - 0.5 K/uL   Basophils Relative 0 %   Basophils Absolute 0.0 0.0 - 0.1 K/uL   Immature Granulocytes 1 %   Abs Immature Granulocytes 0.05 0.00 - 0.07 K/uL  Comprehensive metabolic panel   Collection Time: 10/22/23 10:35 PM  Result Value Ref Range   Sodium 136 135 - 145 mmol/L   Potassium 3.9 3.5 - 5.1 mmol/L   Chloride 101 98 - 111 mmol/L   CO2 20 (L) 22 - 32 mmol/L   Glucose, Bld 185 (H) 70 - 99 mg/dL   BUN 10 6 - 20 mg/dL   Creatinine, Ser 9.27 0.44 - 1.00 mg/dL   Calcium  9.2 8.9 - 10.3 mg/dL    Total Protein 7.3 6.5 - 8.1 g/dL   Albumin 3.7 3.5 - 5.0 g/dL   AST 21 15 - 41 U/L   ALT 11 0 - 44 U/L   Alkaline Phosphatase 60 38 - 126 U/L   Total Bilirubin 0.6 0.0 -  1.2 mg/dL   GFR, Estimated >39 >39 mL/min   Anion gap 15 5 - 15  Urine Drug Screen, Qualitative ([facility] only)   Collection Time: 10/23/23 12:12 AM  Result Value Ref Range   Tricyclic, Ur Screen NONE DETECTED NONE DETECTED   Amphetamines, Ur Screen NONE DETECTED NONE DETECTED   MDMA (Ecstasy)Ur Screen NONE DETECTED NONE DETECTED   Cocaine Metabolite,Ur [place] NONE DETECTED NONE DETECTED   Opiate, Ur Screen POSITIVE (A) NONE DETECTED   Phencyclidine (PCP) Ur S NONE DETECTED NONE DETECTED   Cannabinoid 50 Ng, Ur [place] POSITIVE (A) NONE DETECTED   Barbiturates, Ur Screen NONE DETECTED NONE DETECTED   Benzodiazepine, Ur Scrn NONE DETECTED NONE DETECTED   Methadone Scn, Ur NONE DETECTED NONE DETECTED  Urinalysis, Complete w Microscopic -Urine, Clean Catch   Collection Time: 10/23/23 12:12 AM  Result Value Ref Range   Color, Urine YELLOW (A) YELLOW   APPearance CLEAR (A) CLEAR   Specific Gravity, Urine 1.032 (H) 1.005 - 1.030   pH 5.0 5.0 - 8.0   Glucose, UA NEGATIVE NEGATIVE mg/dL   Hgb urine dipstick NEGATIVE NEGATIVE   Bilirubin Urine NEGATIVE NEGATIVE   Ketones, ur 5 (A) NEGATIVE mg/dL   Protein, ur 899 (A) NEGATIVE mg/dL   Nitrite NEGATIVE NEGATIVE   Leukocytes,Ua NEGATIVE NEGATIVE   RBC / HPF 0-5 0 - 5 RBC/hpf   WBC, UA 0-5 0 - 5 WBC/hpf   Bacteria, UA RARE (A) NONE SEEN   Squamous Epithelial / HPF 0-5 0 - 5 /HPF   Mucus PRESENT   Culture, blood (x 2)   Collection Time: 10/23/23  4:19 AM   Specimen: BLOOD  Result Value Ref Range   Specimen Description BLOOD BLOOD RIGHT HAND    Special Requests      BOTTLES DRAWN AEROBIC AND ANAEROBIC Blood Culture results may not be optimal due to an inadequate volume of blood received in culture bottles   Culture      NO GROWTH 5 DAYS Performed at [facility], [address] [place], [place] [address]    Report Status 10/28/2023 FINAL   Culture, blood (x 2)   Collection Time: 10/23/23  4:19 AM   Specimen: BLOOD  Result Value Ref Range   Specimen Description BLOOD BLOOD LEFT HAND    Special Requests      BOTTLES DRAWN AEROBIC AND ANAEROBIC Blood Culture results may not be optimal due to an inadequate volume of blood received in culture bottles   Culture      NO GROWTH 5 DAYS Performed at [facility], [address] [place], [place] [address]    Report Status 10/28/2023 FINAL   CBC with Differential   Collection Time: 10/23/23  4:19 AM  Result Value Ref Range   WBC 13.7 (H) 4.0 - 10.5 K/uL   RBC 4.78 3.87 - 5.11 MIL/uL   Hemoglobin 13.2 12.0 - 15.0 g/dL   HCT 58.9 63.9 - 53.9 %   MCV 85.8 80.0 - 100.0 fL   MCH 27.6 26.0 - 34.0 pg   MCHC 32.2 30.0 - 36.0 g/dL   RDW 85.4 88.4 - 84.4 %   Platelets 377 150 - 400 K/uL   nRBC 0.0 0.0 - 0.2 %   Neutrophils Relative % 71 %   Neutro Abs 9.6 (H) 1.7 - 7.7 K/uL   Lymphocytes Relative 23 %   Lymphs Abs 3.2 0.7 - 4.0 K/uL   Monocytes Relative 6 %   Monocytes Absolute 0.8  0.1 - 1.0 K/uL   Eosinophils Relative 0 %   Eosinophils Absolute 0.0 0.0 - 0.5 K/uL   Basophils Relative 0 %   Basophils Absolute 0.0 0.0 - 0.1 K/uL   Immature Granulocytes 0 %   Abs Immature Granulocytes 0.06 0.00 - 0.07 K/uL  Comprehensive metabolic panel   Collection Time: 10/23/23  4:19 AM  Result Value Ref Range   Sodium 136 135 - 145 mmol/L   Potassium 3.7 3.5 - 5.1 mmol/L   Chloride 101 98 - 111 mmol/L   CO2 21 (L) 22 - 32 mmol/L   Glucose, Bld 127 (H) 70 - 99 mg/dL   BUN 15 6 - 20 mg/dL   Creatinine, Ser 9.33 0.44 - 1.00 mg/dL   Calcium  9.0 8.9 - 10.3 mg/dL   Total Protein 7.3 6.5 - 8.1 g/dL   Albumin 3.6 3.5 - 5.0 g/dL   AST 16 15 - 41 U/L   ALT 10 0 - 44 U/L   Alkaline Phosphatase 57 38 - 126 U/L   Total Bilirubin 0.6 0.0 - 1.2 mg/dL   GFR, Estimated >39 >39 mL/min   Anion gap 14 5 - 15   Lactic acid, plasma   Collection Time: 10/23/23  4:19 AM  Result Value Ref Range   Lactic Acid, Venous 2.4 (HH) 0.5 - 1.9 mmol/L  Protime-INR   Collection Time: 10/23/23  4:19 AM  Result Value Ref Range   Prothrombin Time 15.6 (H) 11.4 - 15.2 seconds   INR 1.2 0.8 - 1.2  APTT   Collection Time: 10/23/23  4:19 AM  Result Value Ref Range   aPTT 31 24 - 36 seconds  Lactic acid, plasma   Collection Time: 10/23/23  5:51 AM  Result Value Ref Range   Lactic Acid, Venous 1.7 0.5 - 1.9 mmol/L  Resp panel by RT-PCR (RSV, Flu A&B, Covid) Anterior Nasal Swab   Collection Time: 10/23/23  8:57 AM   Specimen: Anterior Nasal Swab  Result Value Ref Range   SARS Coronavirus 2 by RT PCR NEGATIVE NEGATIVE   Influenza A by PCR NEGATIVE NEGATIVE   Influenza B by PCR NEGATIVE NEGATIVE   Resp Syncytial Virus by PCR NEGATIVE NEGATIVE  CBG monitoring, ED   Collection Time: 10/23/23  3:15 PM  Result Value Ref Range   Glucose-Capillary 167 (H) 70 - 99 mg/dL  Comprehensive metabolic panel   Collection Time: 10/23/23  3:17 PM  Result Value Ref Range   Sodium 134 (L) 135 - 145 mmol/L   Potassium 4.0 3.5 - 5.1 mmol/L   Chloride 101 98 - 111 mmol/L   CO2 21 (L) 22 - 32 mmol/L   Glucose, Bld 161 (H) 70 - 99 mg/dL   BUN 12 6 - 20 mg/dL   Creatinine, Ser 9.32 0.44 - 1.00 mg/dL   Calcium  8.4 (L) 8.9 - 10.3 mg/dL   Total Protein 6.5 6.5 - 8.1 g/dL   Albumin 3.2 (L) 3.5 - 5.0 g/dL   AST 18 15 - 41 U/L   ALT 11 0 - 44 U/L   Alkaline Phosphatase 48 38 - 126 U/L   Total Bilirubin 1.1 0.0 - 1.2 mg/dL   GFR, Estimated >39 >39 mL/min   Anion gap 12 5 - 15  Protein and glucose, CSF   Collection Time: 10/23/23  3:31 PM  Result Value Ref Range   Glucose, CSF 66 40 - 70 mg/dL   Total  Protein, CSF 52 (H) 15 - 45 mg/dL  Culture, fungus  without smear   Collection Time: 10/23/23  5:31 PM   Specimen: CSF; Other  Result Value Ref Range   Specimen Description      CSF Performed at [facility], [address] [place], [place] [address]    Special Requests      Normal Performed at [facility], [address], [place], [place] [address]    Culture      NO FUNGUS ISOLATED AFTER 21 DAYS Performed at [facility], [address], [place], [place] [address]    Report Status 11/13/2023 FINAL   CSF culture w Gram Stain   Collection Time: 10/23/23  5:31 PM   Specimen: PATH Cytology CSF; Cerebrospinal Fluid  Result Value Ref Range   Specimen Description      CSF Performed at [facility], [address] [place], [place] [address]    Special Requests      NONE Performed at [facility], [address] [place], [place] [address]    Gram Stain      WBC SEEN RED BLOOD CELLS PRESENT NO ORGANISMS SEEN Performed at [facility], [address] [place], [place] [address]    Culture      NO GROWTH 3 DAYS Performed at [facility], [address] [place] [place], [place] [address]    Report Status 10/27/2023 FINAL   Cryptococcal antigen, CSF   Collection Time: 10/23/23  5:31 PM  Result Value Ref Range   Crypto Ag NEGATIVE NEGATIVE   Cryptococcal Ag Titer NOT INDICATED NOT INDICATED  CSF cell count with differential   Collection Time: 10/23/23  5:31 PM  Result Value Ref Range   Tube # 3    Color, CSF CLEAR (A) COLORLESS   Appearance, CSF COLORLESS (A) CLEAR   Supernatant NOT INDICATED    RBC Count, CSF 5 (H) 0 - 3 /cu mm   WBC, CSF 2 0 - 5 /cu mm   Other Cells, CSF TOO FEW TO COUNT, SMEAR AVAILABLE FOR REVIEW   Meningitis/Encephalitis Panel (CSF)   Collection Time: 10/23/23  5:31 PM  Result Value Ref Range   Cryptococcus neoformans/gattii (CSF) NOT DETECTED NOT DETECTED   Cytomegalovirus (CSF) NOT DETECTED NOT DETECTED   Enterovirus (CSF) NOT DETECTED NOT DETECTED   Escherichia coli K1 (CSF) NOT DETECTED NOT DETECTED   Haemophilus influenzae (CSF) NOT DETECTED NOT DETECTED   Herpes simplex virus 1 (CSF) NOT DETECTED NOT  DETECTED   Herpes simplex virus 2 (CSF) NOT DETECTED NOT DETECTED   Human herpesvirus 6 (CSF) NOT DETECTED NOT DETECTED   Human parechovirus (CSF) NOT DETECTED NOT DETECTED   Listeria monocytogenes (CSF) NOT DETECTED NOT DETECTED   Neisseria meningitis (CSF) NOT DETECTED NOT DETECTED   Streptococcus agalactiae (CSF) NOT DETECTED NOT DETECTED   Streptococcus pneumoniae (CSF) NOT DETECTED NOT DETECTED   Varicella zoster virus (CSF) NOT DETECTED NOT DETECTED  CBG monitoring, ED   Collection Time: 10/23/23  6:48 PM  Result Value Ref Range   Glucose-Capillary 104 (H) 70 - 99 mg/dL  CBG monitoring, ED   Collection Time: 10/24/23 12:22 AM  Result Value Ref Range   Glucose-Capillary 97 70 - 99 mg/dL  CBC   Collection Time: 10/24/23  2:24 AM  Result Value Ref Range   WBC 9.8 4.0 - 10.5 K/uL   RBC 4.13 3.87 - 5.11 MIL/uL   Hemoglobin 11.7 (L) 12.0 - 15.0 g/dL   HCT 64.1 (L) 63.9 - 53.9 %  MCV 86.7 80.0 - 100.0 fL   MCH 28.3 26.0 - 34.0 pg   MCHC 32.7 30.0 - 36.0 g/dL   RDW 85.3 88.4 - 84.4 %   Platelets 286 150 - 400 K/uL   nRBC 0.0 0.0 - 0.2 %  Magnesium    Collection Time: 10/24/23  2:24 AM  Result Value Ref Range   Magnesium  1.7 1.7 - 2.4 mg/dL  Creatinine, serum   Collection Time: 10/24/23  2:24 AM  Result Value Ref Range   Creatinine, Ser 0.62 0.44 - 1.00 mg/dL   GFR, Estimated >39 >39 mL/min  CBG monitoring, ED   Collection Time: 10/24/23  8:05 AM  Result Value Ref Range   Glucose-Capillary 97 70 - 99 mg/dL  Respiratory (~79 pathogens) panel by PCR   Collection Time: 10/24/23  8:22 AM   Specimen: Nasopharyngeal Swab; Respiratory  Result Value Ref Range   Adenovirus NOT DETECTED NOT DETECTED   Coronavirus 229E NOT DETECTED NOT DETECTED   Coronavirus HKU1 NOT DETECTED NOT DETECTED   Coronavirus NL63 NOT DETECTED NOT DETECTED   Coronavirus OC43 NOT DETECTED NOT DETECTED   Metapneumovirus NOT DETECTED NOT DETECTED   Rhinovirus / Enterovirus NOT DETECTED NOT DETECTED    Influenza A NOT DETECTED NOT DETECTED   Influenza B NOT DETECTED NOT DETECTED   Parainfluenza Virus 1 NOT DETECTED NOT DETECTED   Parainfluenza Virus 2 NOT DETECTED NOT DETECTED   Parainfluenza Virus 3 NOT DETECTED NOT DETECTED   Parainfluenza Virus 4 NOT DETECTED NOT DETECTED   Respiratory Syncytial Virus NOT DETECTED NOT DETECTED   Bordetella pertussis NOT DETECTED NOT DETECTED   Bordetella Parapertussis NOT DETECTED NOT DETECTED   Chlamydophila pneumoniae NOT DETECTED NOT DETECTED   Mycoplasma pneumoniae NOT DETECTED NOT DETECTED  ECHOCARDIOGRAM COMPLETE   Collection Time: 10/24/23 11:48 AM  Result Value Ref Range   Weight 3,174.62 oz   Height 66 in   BP 129/66 mmHg   Ao pk vel 1.91 m/s   AR max vel 1.41 cm2   AV Peak grad 14.6 mmHg   S' Lateral 3.00 cm   Area-P 1/2 3.43 cm2   Est EF 60 - 65%   Heparin  level (unfractionated)   Collection Time: 10/24/23  2:50 PM  Result Value Ref Range   Heparin  Unfractionated >1.10 (H) 0.30 - 0.70 IU/mL  Protime-INR   Collection Time: 10/24/23  2:50 PM  Result Value Ref Range   Prothrombin Time 15.2 11.4 - 15.2 seconds   INR 1.1 0.8 - 1.2  APTT   Collection Time: 10/24/23  2:50 PM  Result Value Ref Range   aPTT 106 (H) 24 - 36 seconds  Glucose, capillary   Collection Time: 10/24/23  4:27 PM  Result Value Ref Range   Glucose-Capillary 128 (H) 70 - 99 mg/dL  Glucose, capillary   Collection Time: 10/24/23  9:26 PM  Result Value Ref Range   Glucose-Capillary 174 (H) 70 - 99 mg/dL  Basic metabolic panel with GFR   Collection Time: 10/25/23  3:36 AM  Result Value Ref Range   Sodium 137 135 - 145 mmol/L   Potassium 3.2 (L) 3.5 - 5.1 mmol/L   Chloride 104 98 - 111 mmol/L   CO2 24 22 - 32 mmol/L   Glucose, Bld 144 (H) 70 - 99 mg/dL   BUN 12 6 - 20 mg/dL   Creatinine, Ser 9.45 0.44 - 1.00 mg/dL   Calcium  8.2 (L) 8.9 - 10.3 mg/dL   GFR, Estimated >39 >39 mL/min  Anion gap 9 5 - 15  CBC   Collection Time: 10/25/23  3:36 AM  Result  Value Ref Range   WBC 8.0 4.0 - 10.5 K/uL   RBC 4.12 3.87 - 5.11 MIL/uL   Hemoglobin 11.7 (L) 12.0 - 15.0 g/dL   HCT 64.6 (L) 63.9 - 53.9 %   MCV 85.7 80.0 - 100.0 fL   MCH 28.4 26.0 - 34.0 pg   MCHC 33.1 30.0 - 36.0 g/dL   RDW 85.3 88.4 - 84.4 %   Platelets 255 150 - 400 K/uL   nRBC 0.0 0.0 - 0.2 %  Magnesium    Collection Time: 10/25/23  3:36 AM  Result Value Ref Range   Magnesium  1.8 1.7 - 2.4 mg/dL  Glucose, capillary   Collection Time: 10/25/23  8:11 AM  Result Value Ref Range   Glucose-Capillary 131 (H) 70 - 99 mg/dL  Glucose, capillary   Collection Time: 10/25/23  1:20 PM  Result Value Ref Range   Glucose-Capillary 149 (H) 70 - 99 mg/dL  Glucose, capillary   Collection Time: 10/25/23  4:21 PM  Result Value Ref Range   Glucose-Capillary 152 (H) 70 - 99 mg/dL  Glucose, capillary   Collection Time: 10/25/23  7:46 PM  Result Value Ref Range   Glucose-Capillary 162 (H) 70 - 99 mg/dL  CBC   Collection Time: 10/26/23  4:54 AM  Result Value Ref Range   WBC 6.9 4.0 - 10.5 K/uL   RBC 4.20 3.87 - 5.11 MIL/uL   Hemoglobin 11.8 (L) 12.0 - 15.0 g/dL   HCT 63.8 63.9 - 53.9 %   MCV 86.0 80.0 - 100.0 fL   MCH 28.1 26.0 - 34.0 pg   MCHC 32.7 30.0 - 36.0 g/dL   RDW 85.4 88.4 - 84.4 %   Platelets 271 150 - 400 K/uL   nRBC 0.0 0.0 - 0.2 %  Comprehensive metabolic panel   Collection Time: 10/26/23  4:54 AM  Result Value Ref Range   Sodium 137 135 - 145 mmol/L   Potassium 3.6 3.5 - 5.1 mmol/L   Chloride 105 98 - 111 mmol/L   CO2 24 22 - 32 mmol/L   Glucose, Bld 131 (H) 70 - 99 mg/dL   BUN 9 6 - 20 mg/dL   Creatinine, Ser 9.59 (L) 0.44 - 1.00 mg/dL   Calcium  8.3 (L) 8.9 - 10.3 mg/dL   Total Protein 6.4 (L) 6.5 - 8.1 g/dL   Albumin 3.2 (L) 3.5 - 5.0 g/dL   AST 12 (L) 15 - 41 U/L   ALT 7 0 - 44 U/L   Alkaline Phosphatase 47 38 - 126 U/L   Total Bilirubin 0.4 0.0 - 1.2 mg/dL   GFR, Estimated >39 >39 mL/min   Anion gap 8 5 - 15  Glucose, capillary   Collection Time: 10/26/23   8:16 AM  Result Value Ref Range   Glucose-Capillary 133 (H) 70 - 99 mg/dL  Glucose, capillary   Collection Time: 10/26/23 12:14 PM  Result Value Ref Range   Glucose-Capillary 161 (H) 70 - 99 mg/dL      Assessment & Plan:   Problem List Items Addressed This Visit   None Visit Diagnoses       Bacterial conjunctivitis of both eyes    -  Primary   Relevant Medications   trimethoprim -polymyxin b  (POLYTRIM ) ophthalmic solution       Bilateral bacterial conjunctivitis Persistent conjunctivitis with crusted discharge and pain despite moxifloxacin. Recent E-Visit 11/19/23 now not  improving after antibiotic eye drops  Differential includes viral and bacterial conjunctivitis. No cellulitis or extension noted.  Polytrim  previously more effective for her.  - Stop Moxifloxacin - Prescribed Polytrim  eye drops, four times daily for 7-10 days. - Instructed to contact eye doctor if no improvement by next week.  Yeast Candidiasis Awaiting pickup of Diflucan  prescription. It was ordered 11/23/23      No orders of the defined types were placed in this encounter.   Meds ordered this encounter  Medications   trimethoprim -polymyxin b  (POLYTRIM ) ophthalmic solution    Sig: Place 1 drop into both eyes 4 (four) times daily. For up to 7 to 10 days.    Dispense:  10 mL    Refill:  0    Follow up plan: Return if symptoms worsen or fail to improve.   Patient verbalizes understanding with the above medical recommendations including the limitation of remote medical advice.  Specific follow-up and call-back criteria were given for patient to follow-up or seek medical care more urgently if needed.  Total duration of direct patient care provided via video conference: 10 minutes   Marsa Officer, DO [facility] 11/25/2023, 3:52 PM

## 2023-11-25 NOTE — Telephone Encounter (Signed)
 Copied from CRM #[id number]. Topic: Appointments - Appointment Info/Confirmation >> Nov 25, 2023  3:50 PM Delon DASEN wrote: Patient called again, wanting an update on her appt.

## 2023-11-25 NOTE — Patient Instructions (Addendum)
 Stop previous eye drops. Start Polytrim  antibiotic eye drops Use cool and warm compresses as needed If not improving, please seek care at [facility] within 48-72 hours.  Diflucan  for yeast infection at pharmacy  Please schedule a Follow-up Appointment to: Return if symptoms worsen or fail to improve.  If you have any other questions or concerns, please feel free to call the office or send a message through MyChart. You may also schedule an earlier appointment if necessary.  Additionally, you may be receiving a survey about your experience at our office within a few days to 1 week by e-mail or mail. We value your feedback.  Marsa Officer, DO [facility], [place]

## 2023-11-26 ENCOUNTER — Ambulatory Visit: Payer: Self-pay

## 2023-11-26 ENCOUNTER — Ambulatory Visit

## 2023-11-26 NOTE — Telephone Encounter (Signed)
 Please share constipation advice, can send on mychart as well. She may need to increase dosage quickly on miralax  if 1-2 doses is insufficient. Often constipation will be worse with patients taking Oxycodone . Or other opioid.  For Constipation (less frequent bowel movement that can be hard dry or involve straining).  Recommend trying OTC Miralax  17g = 1 capful in large glass water  once daily for now, try several days to see if working, goal is soft stool or BM 1-2 times daily, if too loose then reduce dose or try every other day. If not effective may need to increase it to 2 doses at once in AM or may do 1 in morning and 1 in afternoon/evening  - This medicine is very safe and can be used often without any problem and will not make you dehydrated. It is good for use on AS NEEDED BASIS or even MAINTENANCE therapy for longer term for several days to weeks at a time to help regulate bowel movements  Other more natural remedies or preventative treatment: - Increase hydration with water  - Increase fiber in diet (high fiber foods = vegetables, leafy greens, oats/grains) - May take OTC Fiber supplement (metamucil powder or pill/gummy) - May try OTC Probiotic  Marsa Officer, DO [facility] 11/26/2023, 3:21 PM

## 2023-11-26 NOTE — Telephone Encounter (Signed)
 FYI Only or Action Required?: Action required by provider: clinical question for provider and update on patient condition.  Patient was last seen in primary care on 11/25/2023 by Edman Marsa PARAS, DO.  Called Nurse Triage reporting Constipation.  Symptoms began several weeks ago.  Interventions attempted: Stool softeners.  Symptoms are: gradually worsening.  Triage Disposition: See PCP When Office is Open (Within 3 Days)  Patient/caregiver understands and will follow disposition?: No, wishes to speak with PCP     Copied from CRM #[id number]. Topic: Clinical - Red Word Triage >> Nov 26, 2023  2:43 PM Ivette P wrote: Red Word that prompted transfer to Nurse Triage: pt is so constipated can't move. Pt is very irritated.       Reason for Disposition  Unable to have a bowel movement (BM) without laxative or enema  Answer Assessment - Initial Assessment Questions Patient seen yesterday and forgot to mention her constipation. She is requesting a prescription to help with her constipation. Please advise.      1. STOOL PATTERN OR FREQUENCY: How often do you have a bowel movement (BM)?  (Normal range: 3 times a day to every 3 days)  When was your last BM?       3 days ago  2. STRAINING: Do you have to strain to have a BM?      Yes 3. ONSET: When did the constipation begin?     3 weeks ago 4. RECTAL PAIN: Does your rectum hurt when the stool comes out? If Yes, ask: Do you have hemorrhoids? How bad is the pain?  (Scale 1-10; or mild, moderate, severe)     Some 5. BM COMPOSITION: Are the stools hard?      Yes, hard little balls  6. BLOOD ON STOOLS: Has there been any blood on the toilet tissue or on the surface of the BM? If Yes, ask: When was the last time?     No 7. CHRONIC CONSTIPATION: Is this a new problem for you?  If No, ask: How long have you had this problem? (days, weeks, months)      Has had similar problems  8. CHANGES IN DIET OR HYDRATION:  Have there been any recent changes in your diet? How much fluids are you drinking on a daily basis?  How much have you had to drink today?     No 9. MEDICINES: Have you been taking any new medicines? Are you taking any narcotic pain medicines? (e.g., Dilaudid , morphine , Percocet, Vicodin)     I've been in and out of the hospital and it's got to be due the medication 10. LAXATIVES: Have you been using any stool softeners, laxatives, or enemas?  If Yes, ask What are you using, how often, and when was the last time?       Takes stool softener daily  11. ACTIVITY:  How much walking do you do every day?  Has your activity level decreased in the past week?        Decreased activity  12. CAUSE: What do you think is causing the constipation?        Unsure if due to medications she took in the hospital  14. OTHER SYMPTOMS: Do you have any other symptoms? (e.g., abdomen pain, bloating, fever, vomiting)       Some abdominal pain  Protocols used: Constipation-A-AH

## 2023-12-01 ENCOUNTER — Other Ambulatory Visit: Payer: Self-pay | Admitting: Family Medicine

## 2023-12-01 DIAGNOSIS — K219 Gastro-esophageal reflux disease without esophagitis: Secondary | ICD-10-CM

## 2023-12-01 DIAGNOSIS — E782 Mixed hyperlipidemia: Secondary | ICD-10-CM

## 2023-12-04 NOTE — Telephone Encounter (Signed)
 Requested medications are due for refill today.  unsure  Requested medications are on the active medications list.  yes  Last refill. 10/26/2023  Future visit scheduled.   yes  Notes to clinic.  Rx signed by another provider.    Requested Prescriptions  Pending Prescriptions Disp Refills   clopidogrel  (PLAVIX ) 75 MG tablet [Pharmacy Med Name: Clopidogrel  Bisulfate 75 MG Oral Tablet] 100 tablet 2    Sig: TAKE 1 TABLET BY MOUTH DAILY AT  9AM     Hematology: Antiplatelets - clopidogrel  Failed - 12/04/2023 11:39 AM      Failed - HGB in normal range and within 180 days    Hemoglobin  Date Value Ref Range Status  10/26/2023 11.8 (L) 12.0 - 15.0 g/dL Final   HGB  Date Value Ref Range Status  09/22/2013 14.6 12.0 - 16.0 g/dL Final         Failed - Cr in normal range and within 360 days    Creat  Date Value Ref Range Status  07/03/2023 0.61 0.50 - 1.03 mg/dL Final   Creatinine, Ser  Date Value Ref Range Status  10/26/2023 0.40 (L) 0.44 - 1.00 mg/dL Final         Passed - HCT in normal range and within 180 days    HCT  Date Value Ref Range Status  10/26/2023 36.1 36.0 - 46.0 % Final  09/22/2013 44.3 35.0 - 47.0 % Final         Passed - PLT in normal range and within 180 days    Platelets  Date Value Ref Range Status  10/26/2023 271 150 - 400 K/uL Final   Platelet  Date Value Ref Range Status  09/22/2013 300 150 - 440 x10 3/mm 3 Final         Passed - Valid encounter within last 6 months    Recent Outpatient Visits           1 week ago Bacterial conjunctivitis of both eyes   [place] [facility] Edman Marsa PARAS, DO   1 month ago Seizure [facility])   [place] [facility] Edman Marsa PARAS, DO   2 months ago Lingular pneumonia   [place] [facility] Edman Marsa PARAS, DO   5 months ago Cryptogenic stroke [facility])   [place] [facility] Edman Marsa PARAS, DO   9  months ago Acute conjunctivitis of both eyes, unspecified acute conjunctivitis type   [place] [facility] [place], Marsa PARAS, DO              Signed Prescriptions Disp Refills   pantoprazole  (PROTONIX ) 40 MG tablet 100 tablet 1    Sig: TAKE 1 TABLET BY MOUTH DAILY AT  9 AM     Gastroenterology: Proton Pump Inhibitors Passed - 12/04/2023 11:39 AM      Passed - Valid encounter within last 12 months    Recent Outpatient Visits           1 week ago Bacterial conjunctivitis of both eyes   [place] [facility] [place], Marsa PARAS, DO   1 month ago Seizure [facility])   [place] [facility] [place], Marsa PARAS, DO   2 months ago Lingular pneumonia   [place] [facility] Edman Marsa PARAS, DO   5 months ago Cryptogenic stroke [facility])   [place] [facility] [place], Marsa PARAS, [place]  9 months ago Acute conjunctivitis of both eyes, unspecified acute conjunctivitis type   [facility] [place], Marsa PARAS, DO              Refused Prescriptions Disp Refills   atorvastatin  (LIPITOR) 40 MG tablet [Pharmacy Med Name: Atorvastatin  Calcium  40 MG Oral Tablet] 60 tablet 5    Sig: TAKE 1 TABLET BY MOUTH AT  BEDTIME     Cardiovascular:  Antilipid - Statins Failed - 12/04/2023 11:39 AM      Failed - Lipid Panel in normal range within the last 12 months    Cholesterol  Date Value Ref Range Status  06/03/2023 137 0 - 200 mg/dL Final  90/74/7985 792 (H) 0 - 200 mg/dL Final   Ldl Cholesterol, Calc  Date Value Ref Range Status  10/07/2012 127 (H) 0 - 100 mg/dL Final   LDL Cholesterol  Date Value Ref Range Status  06/03/2023 71 0 - 99 mg/dL Final    Comment:           Total Cholesterol/HDL:CHD Risk Coronary Heart Disease Risk Table                     Men   Women  1/2 Average Risk   3.4   3.3  Average Risk       5.0   4.4  2 X Average Risk    9.6   7.1  3 X Average Risk  23.4   11.0        Use the calculated Patient Ratio above and the CHD Risk Table to determine the patient's CHD Risk.        ATP III CLASSIFICATION (LDL):  <100     mg/dL   Optimal  899-870  mg/dL   Near or Above                    Optimal  130-159  mg/dL   Borderline  839-810  mg/dL   High  >809     mg/dL   Very High Performed at [facility], [address] [place], [place] [address]    HDL Cholesterol  Date Value Ref Range Status  10/07/2012 25 (L) 40 - 60 mg/dL Final   HDL  Date Value Ref Range Status  06/03/2023 31 (L) >40 mg/dL Final   Triglycerides  Date Value Ref Range Status  06/03/2023 177 (H) <150 mg/dL Final  90/74/7985 726 (H) 0 - 200 mg/dL Final         Passed - Patient is not pregnant      Passed - Valid encounter within last 12 months    Recent Outpatient Visits           1 week ago Bacterial conjunctivitis of both eyes   [place] [facility] [place], Marsa PARAS, DO   1 month ago [facility])   [place] [facility] Edman Marsa PARAS, DO   2 months ago Lingular pneumonia    [facility] Edman [facility]a PARAS, DO   5 months ago Cryptogenic stroke [facility])    [facility] Edman Marsa PARAS, DO   9 months ago Acute conjunctivitis of both eyes, unspecified acute conjunctivitis type   [facility] [place], Marsa PARAS, [place]

## 2023-12-04 NOTE — Telephone Encounter (Signed)
 Requested Prescriptions  Pending Prescriptions Disp Refills   clopidogrel  (PLAVIX ) 75 MG tablet [Pharmacy Med Name: Clopidogrel  Bisulfate 75 MG Oral Tablet] 100 tablet 2    Sig: TAKE 1 TABLET BY MOUTH DAILY AT  9AM     Hematology: Antiplatelets - clopidogrel  Failed - 12/04/2023 11:38 AM      Failed - HGB in normal range and within 180 days    Hemoglobin  Date Value Ref Range Status  10/26/2023 11.8 (L) 12.0 - 15.0 g/dL Final   HGB  Date Value Ref Range Status  09/22/2013 14.6 12.0 - 16.0 g/dL Final         Failed - Cr in normal range and within 360 days    Creat  Date Value Ref Range Status  07/03/2023 0.61 0.50 - 1.03 mg/dL Final   Creatinine, Ser  Date Value Ref Range Status  10/26/2023 0.40 (L) 0.44 - 1.00 mg/dL Final         Passed - HCT in normal range and within 180 days    HCT  Date Value Ref Range Status  10/26/2023 36.1 36.0 - 46.0 % Final  09/22/2013 44.3 35.0 - 47.0 % Final         Passed - PLT in normal range and within 180 days    Platelets  Date Value Ref Range Status  10/26/2023 271 150 - 400 K/uL Final   Platelet  Date Value Ref Range Status  09/22/2013 300 150 - 440 x10 3/mm 3 Final         Passed - Valid encounter within last 6 months    Recent Outpatient Visits           1 week ago Bacterial conjunctivitis of both eyes   [place] [facility] Edman Marsa PARAS, DO   1 month ago Seizure [facility])   [place] [facility] Edman Marsa PARAS, DO   2 months ago Lingular pneumonia   [place] [facility] Edman Marsa PARAS, DO   5 months ago Cryptogenic stroke [facility])   [place] [facility] Edman Marsa PARAS, DO   9 months ago Acute conjunctivitis of both eyes, unspecified acute conjunctivitis type   [place] [facility] [place], Marsa PARAS, DO               pantoprazole  (PROTONIX ) 40 MG tablet [Pharmacy Med Name:  Pantoprazole  Sodium 40 MG Oral Tablet Delayed Release] 100 tablet 1    Sig: TAKE 1 TABLET BY MOUTH DAILY AT  9 AM     Gastroenterology: Proton Pump Inhibitors Passed - 12/04/2023 11:38 AM      Passed - Valid encounter within last 12 months    Recent Outpatient Visits           1 week ago Bacterial conjunctivitis of both eyes   [place] [facility] [place], Marsa PARAS, DO   1 month ago Seizure [facility])   [place] [facility] Edman Marsa PARAS, DO   2 months ago Lingular pneumonia   [place] [facility] Edman Marsa PARAS, DO   5 months ago Cryptogenic stroke [facility])    [facility]a PARAS, DO   9 months ago Acute conjunctivitis of both eyes, unspecified acute conjunctivitis type   [facility] [place], Marsa PARAS, [place]  Refused Prescriptions Disp Refills   atorvastatin  (LIPITOR) 40 MG tablet [Pharmacy Med Name: Atorvastatin  Calcium  40 MG Oral Tablet] 60 tablet 5    Sig: TAKE 1 TABLET BY MOUTH AT  BEDTIME     Cardiovascular:  Antilipid - Statins Failed - 12/04/2023 11:38 AM      Failed - Lipid Panel in normal range within the last 12 months    Cholesterol  Date Value Ref Range Status  06/03/2023 137 0 - 200 mg/dL Final  90/74/7985 792 (H) 0 - 200 mg/dL Final   Ldl Cholesterol, Calc  Date Value Ref Range Status  10/07/2012 127 (H) 0 - 100 mg/dL Final   LDL Cholesterol  Date Value Ref Range Status  06/03/2023 71 0 - 99 mg/dL Final    Comment:           Total Cholesterol/HDL:CHD Risk Coronary Heart Disease Risk Table                     Men   Women  1/2 Average Risk   3.4   3.3  Average Risk       5.0   4.4  2 X Average Risk   9.6   7.1  3 X Average Risk  23.4   11.0        Use the calculated Patient Ratio above and the CHD Risk Table to determine the patient's CHD Risk.        ATP III CLASSIFICATION (LDL):   <100     mg/dL   Optimal  899-870  mg/dL   Near or Above                    Optimal  130-159  mg/dL   Borderline  839-810  mg/dL   High  >809     mg/dL   Very High Performed at [facility], [address] [place], [place] [address]    HDL Cholesterol  Date Value Ref Range Status  10/07/2012 25 (L) 40 - 60 mg/dL Final   HDL  Date Value Ref Range Status  06/03/2023 31 (L) >40 mg/dL Final   Triglycerides  Date Value Ref Range Status  06/03/2023 177 (H) <150 mg/dL Final  90/74/7985 726 (H) 0 - 200 mg/dL Final         Passed - Patient is not pregnant      Passed - Valid encounter within last 12 months    Recent Outpatient Visits           1 week ago Bacterial conjunctivitis of both eyes   [place] [facility] [place], Marsa PARAS, DO   1 month ago Seizure [facility])   [place] PARAS, DO   2 months ago Lingular pneumonia   [place] [facility] Edman Marsa PARAS, DO   5 months ago Cryptogenic stroke [facility])   [place] [facility] Edman Marsa PARAS, DO   9 months ago Acute conjunctivitis of both eyes, unspecified acute conjunctivitis type   [facility] [place], Marsa PARAS, [place]

## 2023-12-06 ENCOUNTER — Other Ambulatory Visit: Payer: Self-pay | Admitting: Family Medicine

## 2023-12-06 DIAGNOSIS — F331 Major depressive disorder, recurrent, moderate: Secondary | ICD-10-CM

## 2023-12-07 NOTE — Telephone Encounter (Signed)
 Discontinued 10/20/23.  Requested Prescriptions  Pending Prescriptions Disp Refills   buPROPion  (WELLBUTRIN  XL) 150 MG 24 hr tablet [Pharmacy Med Name: buPROPion  HCl ER (XL) 150 MG Oral Tablet Extended Release 24 Hour] 100 tablet 2    Sig: TAKE 1 TABLET BY MOUTH DAILY     Psychiatry: Antidepressants - bupropion  Failed - 12/07/2023  4:04 PM      Failed - Cr in normal range and within 360 days    Creat  Date Value Ref Range Status  07/03/2023 0.61 0.50 - 1.03 mg/dL Final   Creatinine, Ser  Date Value Ref Range Status  10/26/2023 0.40 (L) 0.44 - 1.00 mg/dL Final         Failed - AST in normal range and within 360 days    AST  Date Value Ref Range Status  10/26/2023 12 (L) 15 - 41 U/L Final   SGOT(AST)  Date Value Ref Range Status  09/22/2013 17 15 - 37 Unit/L Final         Passed - ALT in normal range and within 360 days    ALT  Date Value Ref Range Status  10/26/2023 7 0 - 44 U/L Final   SGPT (ALT)  Date Value Ref Range Status  09/22/2013 16 U/L Final    Comment:    14-63 NOTE: New Reference Range 08/02/13          Passed - Completed PHQ-2 or PHQ-9 in the last 360 days      Passed - Last BP in normal range    BP Readings from Last 1 Encounters:  10/30/23 103/60         Passed - Valid encounter within last 6 months    Recent Outpatient Visits           1 week ago Bacterial conjunctivitis of both eyes   [place] [facility] Ed[facility] Marsa PARAS, DO   1 month ago [facility])   [place] [facility] Ed[facility] Marsa PARAS, DO   2 months ago Lingular pneumonia   [place] [facility] Marsa PARAS, DO   5 months ago Cryptogenic stroke [facility])   [place] [facility] Marsa PARAS, DO   9 months ago Acute conjunctivitis of both eyes, unspecified acute conjunctivitis type   [facility] [place], Marsa PARAS, [place]

## 2023-12-14 ENCOUNTER — Telehealth: Payer: Self-pay | Admitting: Family Medicine

## 2023-12-14 ENCOUNTER — Ambulatory Visit: Admitting: Student in an Organized Health Care Education/Training Program

## 2023-12-14 DIAGNOSIS — R569 Unspecified convulsions: Secondary | ICD-10-CM

## 2023-12-14 NOTE — Telephone Encounter (Unsigned)
 Copied from CRM #[id number]. Topic: Clinical - Medication Refill >> Dec 14, 2023  1:32 PM Everette C wrote: Medication: levETIRAcetam  (KEPPRA ) 500 MG tablet   Has the patient contacted their pharmacy? Yes (Agent: If no, request that the patient contact the pharmacy for the refill. If patient does not wish to contact the pharmacy document the reason why and proceed with request.) (Agent: If yes, when and what did the pharmacy advise?)  This is the patient's preferred pharmacy:  [facility] [place] [place] - [address] [place] [address] Phone: [phone] Fax: [phone]  Is this the correct pharmacy for this prescription? Yes If no, delete pharmacy and type the correct one.   Has the prescription been filled recently? No  Is the patient out of the medication? No  Has the patient been seen for an appointment in the last year OR does the patient have an upcoming appointment? Yes  Can we respond through MyChart? No  Agent: Please be advised that Rx refills may take up to 3 business days. We ask that you follow-up with your pharmacy.

## 2023-12-17 MED ORDER — LEVETIRACETAM 500 MG PO TABS: 500.0000 mg | ORAL_TABLET | Freq: Two times a day (BID) | ORAL | 1 refills | Status: AC

## 2023-12-17 NOTE — Telephone Encounter (Signed)
 Requested medications are due for refill today. no  Requested medications are on the active medications list.  yes  Last refill. 10/26/2023 #180 1 rf  Future visit scheduled.   yes  Notes to clinic.  Rx signed by Dr. Kandis. Pt wants medication to go to a different pharmacy.    Requested Prescriptions  Pending Prescriptions Disp Refills   levETIRAcetam  (KEPPRA ) 500 MG tablet 180 tablet 1    Sig: Take 1 tablet (500 mg total) by mouth 2 (two) times daily.     Neurology:  Anticonvulsants - levetiracetam  Failed - 12/17/2023  2:09 PM      Failed - Cr in normal range and within 360 days    Creat  Date Value Ref Range Status  07/03/2023 0.61 0.50 - 1.03 mg/dL Final   Creatinine, Ser  Date Value Ref Range Status  10/26/2023 0.40 (L) 0.44 - 1.00 mg/dL Final         Passed - Completed PHQ-2 or PHQ-9 in the last 360 days      Passed - Valid encounter within last 12 months    Recent Outpatient Visits           3 weeks ago Bacterial conjunctivitis of both eyes   [place] [facility] Ed[facility] Marsa PARAS, DO   1 month ago [facility]izure [facility])   [place] [facility] Edman Marsa PARAS, DO   2 months ago Lingular pneumonia   [place] [facility] Edman Marsa PARAS, DO   5 months ago Cryptogenic stroke [facility])   [place] [facility] Ed[facility] Marsa PARAS, DO   9 months ago Acute conjunctivitis of both eyes, unspecified acute conjunctivitis type   [place] [facility] [place], Marsa PARAS, [place]

## 2023-12-18 ENCOUNTER — Ambulatory Visit: Payer: Self-pay

## 2023-12-18 NOTE — Telephone Encounter (Signed)
 I agree with acute triage to ED.  Acute sudden groin pain in area of stent is concerning. This is not something we can treat outpatient, and I am not in office today. It is also late on Friday afternoon any testing or imaging would not be done fast enough.  They can do imaging at hospital ED  Other option would she can contact her Vascular doctor to get evaluated by them, however they will most likely say go to hospital ED and they can evaluate it there  Marsa Officer, DO [facility] 12/18/2023, 4:08 PM

## 2023-12-18 NOTE — Telephone Encounter (Signed)
 FYI Only or Action Required?: FYI only for provider: ED advised and patient refused-wants to speak with provider.  Patient was last seen in primary care on 11/25/2023 by Edman Marsa PARAS, DO.  Called Nurse Triage reporting Groin Pain and Leg Pain.  Symptoms began today.  Interventions attempted: Rest, hydration, or home remedies.  Symptoms are: unchanged.  Triage Disposition: Go to ED Now (or PCP Triage)  Patient/caregiver understands and will follow disposition?: No, refuses disposition  Copied from CRM [id number]. Topic: Clinical - Red Word Triage >> Dec 18, 2023  3:36 PM Larissa S wrote: [facility] that prompted transfer to Nurse Triage: severe pain >> Dec 18, 2023  3:39 PM Larissa S wrote: Patient disconnected, attempted callback, no response.  Reason for Disposition  Patient sounds very sick or weak to the triager  Answer Assessment - Initial Assessment Questions Reports left thigh pain started this morning when she woke up. Patient states the pain seem to be in her left groin and left upper thigh area. Patient is concerned with the sudden pain and having a stent in the area. Pain is 7 out of 10. Patient recommended to the ED but patient refused. Patient would rather speak to provider in regards to her symptoms. Asking for a call back from PCP.  1. ONSET: When did the pain start?      Woke up with pain this morning 2. LOCATION: Where is the pain located?      Left upper leg and groin area 3. PAIN: How bad is the pain?    (Scale 1-10; or mild, moderate, severe)     7 out of 10 4. WORK OR EXERCISE: Has there been any recent work or exercise that involved this part of the body?      no 5. CAUSE: What do you think is causing the leg pain?     Has a stent in the left thigh-concerned if the stent can go bad 6. OTHER SYMPTOMS: Do you have any other symptoms? (e.g., chest pain, back pain, breathing difficulty, swelling, rash, fever, numbness, weakness)     Back pain  but at her normal.  Protocols used: Leg Pain-A-AH

## 2023-12-22 ENCOUNTER — Ambulatory Visit: Payer: Self-pay

## 2023-12-22 NOTE — Progress Notes (Signed)
 [name]                                          MRN: [id number]   12/22/2023   The [facility] Quality Team Specialist reviewed this patient medical record for the purposes of chart review for care gap closure. The following were reviewed: chart review for care gap closure-kidney health evaluation for diabetes:eGFR  and uACR.    [facility] Quality Team

## 2023-12-22 NOTE — Telephone Encounter (Signed)
 FYI Only or Action Required?: FYI only for provider: appointment scheduled on 12/23/23 via video.  Patient was last seen in primary care on 11/25/2023 by Edman Marsa PARAS, DO.  Called Nurse Triage reporting No chief complaint on file..  Symptoms began several days ago.  Interventions attempted: OTC medications: desitin cream.  Symptoms are: gradually worsening.  Triage Disposition: No disposition on file.  Patient/caregiver understands and will follow disposition?:   Copied from CRM #[id number]. Topic: Clinical - Red Word Triage >> Dec 22, 2023  5:59 PM Shanda MATSU wrote: Red Word that prompted transfer to Nurse Triage: Patient is reporting painful spots on her bottom. Reason for Disposition  [1] Unexplained sores AND [2] 3 or more  Answer Assessment - Initial Assessment Questions Pt called in stating she has painful sores on her buttocks d/t being bedridden. She has been using desitin cream to aid with healing but would like to be evaluated d/t being bedridden and having diabetes. Discussed that it would be more appropriate to be seen in office but pt preferred video visit d/t having to use public transport and not having assistance. Appt scheduled and sent pt link to reset her my chart password via her email. Appointment scheduled for evaluation. Patient agrees with plan of care, and will call back if anything changes, or if symptoms worsen.      1. APPEARANCE of SORES: What do the sores look like?     Bedsore d/t being bedridden   2. NUMBER: How many sores are there?     Unknown d/t being on buttocks   3. SIZE: How big is the largest sore?     Unknown   4. LOCATION: Where are the sores located?     Buttocks d/t pressure   5. ONSET: When did the sores begin?     Over the past couple days   6. TENDER: Does it hurt when you touch it?  (Scale 1-10; or mild, moderate, severe)      Tender/ sore   7. CAUSE: What do you think is causing the sores?      Pressure d/t loss of mobility   8. OTHER SYMPTOMS: Do you have any other symptoms? (e.g., fever, new weakness)     None  Protocols used: Sores-A-AH

## 2023-12-23 ENCOUNTER — Telehealth

## 2023-12-23 DIAGNOSIS — M7918 Myalgia, other site: Secondary | ICD-10-CM | POA: Diagnosis not present

## 2023-12-23 DIAGNOSIS — K5900 Constipation, unspecified: Secondary | ICD-10-CM | POA: Insufficient documentation

## 2023-12-23 MED ORDER — BISACODYL EC 5 MG PO TBEC: 10.0000 mg | DELAYED_RELEASE_TABLET | Freq: Every day | ORAL | 0 refills | Status: AC | PRN

## 2023-12-23 NOTE — Progress Notes (Signed)
 Progress Note  Physician: Nyeemah Jennette A Enzio Buchler, MD   Patient contacted 12/23/23 at  3:00 PM EST by a video enabled telemedicine application and verified that I am speaking with the correct person.   Patient is aware of limitations of evaluation by telemedicine The patient expressed understanding and agreed to proceed.   HPI: [name] is a 58 y.o. female presenting on 12/23/2023 for No chief complaint on file. .  Discussed the use of AI scribe software for clinical note transcription with the patient, who gave verbal consent to proceed.  History of Present Illness   [name] is a 58 year old female with a history of stroke who presents with sores on her buttocks.  Buttock ulcerations - Painful sores on the buttocks developed approximately one week ago - Lesions have progressively worsened, especially at night - Application of an unspecified topical treatment without improvement - Limited mobility secondary to prior stroke - Uses a hospital bed with an electric mattress - Able to stand and sit in a chair, but spends significant portion of the day sitting  Constipation - Constipation present - Docusate taken three times daily without relief - No bowel movement in 5 days  Body habitus - Weight approximately 200 pounds        Social history:  Relevant past medical, surgical, family and social history reviewed and updated as indicated. Interim medical history since our last visit reviewed.  Allergies and medications reviewed and updated.  DATA REVIEWED: CHART IN EPIC  ROS: Negative unless specifically indicated above in HPI.    Current Outpatient Medications:    bisacodyl  5 MG EC tablet, Take 2 tablets (10 mg total) by mouth daily as needed for moderate constipation., Disp: 30 tablet, Rfl: 0   albuterol  (VENTOLIN  HFA) 108 (90 Base) MCG/ACT inhaler, INHALE 2 INHALATIONS BY MOUTH  EVERY 4 HOURS AS NEEDED FOR  WHEEZING OR FOR SHORTNESS OF  BREATH,  Disp: 51 g, Rfl: 1   atorvastatin  (LIPITOR) 40 MG tablet, TAKE 1 TABLET BY MOUTH AT  BEDTIME, Disp: 90 tablet, Rfl: 3   citalopram  (CELEXA ) 40 MG tablet, TAKE 1 TABLET BY MOUTH DAILY, Disp: 100 tablet, Rfl: 1   clopidogrel  (PLAVIX ) 75 MG tablet, Take 1 tablet (75 mg total) by mouth daily., Disp: , Rfl:    fluconazole  (DIFLUCAN ) 150 MG tablet, TAKE 1 TABLET BY MOUTH ON DAY 1 . REPEAT2ND DOSE ON DAY 3, Disp: 2 tablet, Rfl: 1   fluticasone  (FLONASE ) 50 MCG/ACT nasal spray, Place 2 sprays into both nostrils daily. Use for 4-6 weeks then stop and use seasonally or as needed., Disp: 16 g, Rfl: 0   gabapentin  (NEURONTIN ) 600 MG tablet, TAKE 1 TABLET BY MOUTH 4 TIMES  DAILY, Disp: 400 tablet, Rfl: 2   leptospermum manuka honey (MEDIHONEY) gel, Apply 1 Application topically daily. (Patient not taking: Reported on 10/27/2023), Disp: 44 mL, Rfl: 0   levETIRAcetam  (KEPPRA ) 500 MG tablet, Take 1 tablet (500 mg total) by mouth 2 (two) times daily., Disp: 180 tablet, Rfl: 1   midodrine  (PROAMATINE ) 5 MG tablet, Take 1 tablet (5 mg total) by mouth 3 (three) times daily with meals., Disp: 21 tablet, Rfl: 0   MOUNJARO  10 MG/0.5ML Pen, INJECT THE CONTENTS OF ONE PEN  SUBCUTANEOUSLY WEEKLY AS  DIRECTED, Disp: 6 mL, Rfl: 3   naloxone  (NARCAN ) nasal spray 4 mg/0.1 mL, Place 1 spray into the nose once. (Patient not taking: Reported  on 10/30/2023), Disp: , Rfl:    nicotine  (NICODERM CQ  - DOSED IN MG/24 HOURS) 14 mg/24hr patch, Place onto the skin. (Patient not taking: Reported on 10/27/2023), Disp: , Rfl:    nystatin  (MYCOSTATIN /NYSTOP ) powder, Apply 1 Application topically 3 (three) times daily., Disp: 15 g, Rfl: 0   ondansetron  (ZOFRAN -ODT) 4 MG disintegrating tablet, DISSOLVE 1 TABLET ON THE TONGUE EVERY 8 HOURS AS NEEDED FOR NAUSEA AND VOMITING, Disp: 30 tablet, Rfl: 2   oxyCODONE -acetaminophen  (PERCOCET) 10-325 MG tablet, Take 1 tablet by mouth every 6 (six) hours as needed for pain., Disp: , Rfl:    pantoprazole   (PROTONIX ) 40 MG tablet, TAKE 1 TABLET BY MOUTH DAILY AT  9 AM, Disp: 100 tablet, Rfl: 1   rizatriptan  (MAXALT -MLT) 10 MG disintegrating tablet, Take 1 tablet (10 mg total) by mouth as needed. May repeat in 2 hours if needed, Disp: 10 tablet, Rfl: 2   TRELEGY ELLIPTA  100-62.5-25 MCG/ACT AEPB, USE 1 INHALATION BY MOUTH ONCE  DAILY AT THE SAME TIME EACH DAY, Disp: 180 each, Rfl: 3   trimethoprim -polymyxin b  (POLYTRIM ) ophthalmic solution, Place 1 drop into both eyes 4 (four) times daily. For up to 7 to 10 days., Disp: 10 mL, Rfl: 0   Vitamin D , Ergocalciferol , (DRISDOL ) 1.25 MG (50000 UNIT) CAPS capsule, TAKE 1 CAPSULE BY MOUTH EVERY WEDNESDAY @9AM  (Patient not taking: Reported on 10/30/2023), Disp: 4 capsule, Rfl: 11      Objective:  Telephone visit     Assessment & Plan:  Bilateral buttock pain  Constipation, unspecified constipation type  Other orders -     Bisacodyl  EC; Take 2 tablets (10 mg total) by mouth daily as needed for moderate constipation.  Dispense: 30 tablet; Refill: 0   Assessment and Plan    Pressure injury of buttocks Pressure injuries due to prolonged sitting and immobility post-stroke. Risk of progression without intervention. - Referred to home health for wound care assessment and management. - Advised repositioning to reduce pressure, including laying on the side. - Recommended additional bed padding to offload weight.  Constipation Chronic constipation worsened by immobility. Current docusate regimen ineffective. - Prescribed bisacodyl  tablets, 1-2 tablets as needed, for acute relief. - Advised switching to Miralax  for regular use. - Recommended fiber supplementation. - Instructed to follow up if constipation persists.        No follow-ups on file.

## 2023-12-24 ENCOUNTER — Telehealth: Payer: Self-pay

## 2023-12-24 ENCOUNTER — Other Ambulatory Visit: Payer: Self-pay | Admitting: Family Medicine

## 2023-12-24 DIAGNOSIS — I6930 Unspecified sequelae of cerebral infarction: Secondary | ICD-10-CM

## 2023-12-24 DIAGNOSIS — F331 Major depressive disorder, recurrent, moderate: Secondary | ICD-10-CM

## 2023-12-24 DIAGNOSIS — E782 Mixed hyperlipidemia: Secondary | ICD-10-CM

## 2023-12-24 NOTE — Telephone Encounter (Signed)
 Spoke to patient she will reach back out to [facility] to have them refax the refill request

## 2023-12-24 NOTE — Telephone Encounter (Signed)
 Copied from CRM #[id number]. Topic: Referral - Status >> Dec 24, 2023 11:04 AM Victoria B wrote: Reason for CRM: Patient called in  checking status of  referral for home health, because she has a possibility of bed sores.  I let her know that this is still being worked on and turn around of 3-5 business days

## 2023-12-24 NOTE — Telephone Encounter (Signed)
 Copied from CRM #[id number]. Topic: Clinical - Medication Question >> Dec 24, 2023 11:27 AM Treva T wrote: Reason for CRM: Pt calling, states pharmacy advised pt that they are awaiting a med refill response on 4 different medications that needs refilled.  Pt states she does not know the name of the medications pharmacy is referring to.  Pt provided no further details, but is requesting a call from office to verify if fax was received from pharmacy.  Pt can be reached back at [phone].   [facility] - [place], [place] - [address] [place] [address] Phone: [phone] Fax: [phone]

## 2023-12-28 ENCOUNTER — Telehealth: Payer: Self-pay

## 2023-12-28 NOTE — Telephone Encounter (Signed)
 Requested by interface surescripts.  Requested Prescriptions  Pending Prescriptions Disp Refills   clopidogrel  (PLAVIX ) 75 MG tablet [Pharmacy Med Name: Clopidogrel  Bisulfate 75 MG Oral Tablet] 100 tablet 2    Sig: TAKE 1 TABLET BY MOUTH DAILY AT  9AM     Hematology: Antiplatelets - clopidogrel  Failed - 12/28/2023  7:37 AM      Failed - HGB in normal range and within 180 days    Hemoglobin  Date Value Ref Range Status  10/26/2023 11.8 (L) 12.0 - 15.0 g/dL Final   HGB  Date Value Ref Range Status  09/22/2013 14.6 12.0 - 16.0 g/dL Final         Failed - Cr in normal range and within 360 days    Creat  Date Value Ref Range Status  07/03/2023 0.61 0.50 - 1.03 mg/dL Final   Creatinine, Ser  Date Value Ref Range Status  10/26/2023 0.40 (L) 0.44 - 1.00 mg/dL Final         Passed - HCT in normal range and within 180 days    HCT  Date Value Ref Range Status  10/26/2023 36.1 36.0 - 46.0 % Final  09/22/2013 44.3 35.0 - 47.0 % Final         Passed - PLT in normal range and within 180 days    Platelets  Date Value Ref Range Status  10/26/2023 271 150 - 400 K/uL Final   Platelet  Date Value Ref Range Status  09/22/2013 300 150 - 440 x10 3/mm 3 Final         Passed - Valid encounter within last 6 months    Recent Outpatient Visits           5 days ago Bilateral buttock pain   [place] [facility] [place], Parris LABOR, MD   1 month ago Bacterial conjunctivitis of both eyes   [place] [facility] [place] Marsa PARAS, DO   1 month ago Seizure [facility])   [place] [facility] [place] [facility]a PARAS, DO   3 months ago Lingular pneumonia   [place] [facility] [place], Marsa PARAS, DO   6 months ago Cryptogenic stroke [facility])   [place] [facility] [place], Marsa PARAS, DO               buPROPion  (WELLBUTRIN  XL) 150 MG 24 hr tablet [Pharmacy Med Name: buPROPion  HCl ER  (XL) 150 MG Oral Tablet Extended Release 24 Hour] 100 tablet 2    Sig: TAKE 1 TABLET BY MOUTH DAILY     Psychiatry: Antidepressants - bupropion  Failed - 12/28/2023  7:37 AM      Failed - Cr in normal range and within 360 days    Creat  Date Value Ref Range Status  07/03/2023 0.61 0.50 - 1.03 mg/dL Final   Creatinine, Ser  Date Value Ref Range Status  10/26/2023 0.40 (L) 0.44 - 1.00 mg/dL Final         Failed - AST in normal range and within 360 days    AST  Date Value Ref Range Status  10/26/2023 12 (L) 15 - 41 U/L Final   SGOT(AST)  Date Value Ref Range Status  09/22/2013 17 15 - 37 Unit/L Final         Passed - ALT in normal range and within 360 days    ALT  Date Value Ref Range Status  10/26/2023 7 0 - 44 U/L Final  SGPT (ALT)  Date Value Ref Range Status  09/22/2013 16 U/L Final    Comment:    14-63 NOTE: New Reference Range 08/02/13          Passed - Completed PHQ-2 or PHQ-9 in the last 360 days      Passed - Last BP in normal range    BP Readings from Last 1 Encounters:  10/30/23 103/60         Passed - Valid encounter within last 6 months    Recent Outpatient Visits           5 days ago Bilateral buttock pain   [place] [facility] [place], [place] A, MD   1 month ago Bacterial conjunctivitis of both eyes   [place] [facility] [place], Marsa PARAS, DO   1 month ago Seizure [facility])   [place] [facility] [place], Marsa PARAS, DO   3 months ago Lingular pneumonia   [place] [facility] [place], Marsa PARAS, DO   6 months ago Cryptogenic stroke [facility])   [place] [facility] [place], Marsa PARAS, DO               atorvastatin  (LIPITOR) 40 MG tablet [Pharmacy Med Name: Atorvastatin  Calcium  40 MG Oral Tablet] 90 tablet 0    Sig: TAKE 1 TABLET BY MOUTH AT  BEDTIME     Cardiovascular:  Antilipid - Statins Failed - 12/28/2023  7:37 AM       Failed - Lipid Panel in normal range within the last 12 months    Cholesterol  Date Value Ref Range Status  06/03/2023 137 0 - 200 mg/dL Final  90/74/7985 792 (H) 0 - 200 mg/dL Final   Ldl Cholesterol, Calc  Date Value Ref Range Status  10/07/2012 127 (H) 0 - 100 mg/dL Final   LDL Cholesterol  Date Value Ref Range Status  06/03/2023 71 0 - 99 mg/dL Final    Comment:           Total Cholesterol/HDL:CHD Risk Coronary Heart Disease Risk Table                     Men   Women  1/2 Average Risk   3.4   3.3  Average Risk       5.0   4.4  2 X Average Risk   9.6   7.1  3 X Average Risk  23.4   11.0        Use the calculated Patient Ratio above and the CHD Risk Table to determine the patient's CHD Risk.        ATP III CLASSIFICATION (LDL):  <100     mg/dL   Optimal  899-870  mg/dL   Near or Above                    Optimal  130-159  mg/dL   Borderline  839-810  mg/dL   High  >809     mg/dL   Very High Performed at [facility], [address] [place], [place] [address]    HDL Cholesterol  Date Value Ref Range Status  10/07/2012 25 (L) 40 - 60 mg/dL Final   HDL  Date Value Ref Range Status  06/03/2023 31 (L) >40 mg/dL Final   Triglycerides  Date Value Ref Range Status  06/03/2023 177 (H) <150 mg/dL Final  90/74/7985 726 (H) 0 -  200 mg/dL Final         Passed - Patient is not pregnant      Passed - Valid encounter within last 12 months    Recent Outpatient Visits           5 days ago Bilateral buttock pain   [facility] [place], [place], MD   1 month ago Bacterial conjunctivitis of both eyes   [place] [facility] [place], Marsa PARAS, DO   1 month ago Seizure [facility])   [place] [facility] [place] Marsa PARAS, DO   3 months ago Lingular pneumonia   [place] [facility] [place] Marsa PARAS, DO   6 months ago Cryptogenic stroke [facility])   [place], Marsa PARAS, [place]

## 2023-12-28 NOTE — Telephone Encounter (Signed)
 Copied from CRM #[id number]. Topic: General - Other >> Dec 28, 2023  9:21 AM Victoria B wrote: Reason for CRM: Sharlet from [place], called in states, when she went to patient's home for wound care, patient  doesn't have wounds. She states patient ws bought a pressure mattress from [place], but she suggested they take it back as it will not help the patient, but make it worst. beatrix states the patient is obese and doesn't want physical therapy because of knee problems and hasn't been admitted to ome health care, but she suggest that patient has home health care.

## 2023-12-28 NOTE — Telephone Encounter (Signed)
 Requested by interface surescripts. Future visit 12/29/23. Medication discontinued 10/20/23.  Requested Prescriptions  Pending Prescriptions Disp Refills   clopidogrel  (PLAVIX ) 75 MG tablet [Pharmacy Med Name: Clopidogrel  Bisulfate 75 MG Oral Tablet] 100 tablet 2    Sig: TAKE 1 TABLET BY MOUTH DAILY AT  9AM     Hematology: Antiplatelets - clopidogrel  Failed - 12/28/2023  7:39 AM      Failed - HGB in normal range and within 180 days    Hemoglobin  Date Value Ref Range Status  10/26/2023 11.8 (L) 12.0 - 15.0 g/dL Final   HGB  Date Value Ref Range Status  09/22/2013 14.6 12.0 - 16.0 g/dL Final         Failed - Cr in normal range and within 360 days    Creat  Date Value Ref Range Status  07/03/2023 0.61 0.50 - 1.03 mg/dL Final   Creatinine, Ser  Date Value Ref Range Status  10/26/2023 0.40 (L) 0.44 - 1.00 mg/dL Final         Passed - HCT in normal range and within 180 days    HCT  Date Value Ref Range Status  10/26/2023 36.1 36.0 - 46.0 % Final  09/22/2013 44.3 35.0 - 47.0 % Final         Passed - PLT in normal range and within 180 days    Platelets  Date Value Ref Range Status  10/26/2023 271 150 - 400 K/uL Final   Platelet  Date Value Ref Range Status  09/22/2013 300 150 - 440 x10 3/mm 3 Final         Passed - Valid encounter within last 6 months    Recent Outpatient Visits           5 days ago Bilateral buttock pain   [place] [facility] [place], [place] A, MD   1 month ago Bacterial conjunctivitis of both eyes   [place] [facility] Marsa PARAS, DO   1 month ago Seizure [facility])   [place] [facility] Marsa PARAS, DO   3 months ago Lingular pneumonia   [place] [facility] [place], Marsa PARAS, DO   6 months ago Cryptogenic stroke [facility])    [facility] [place], Marsa PARAS, DO              Signed Prescriptions Disp  Refills   atorvastatin  (LIPITOR) 40 MG tablet 90 tablet 0    Sig: TAKE 1 TABLET BY MOUTH AT  BEDTIME     Cardiovascular:  Antilipid - Statins Failed - 12/28/2023  7:39 AM      Failed - Lipid Panel in normal range within the last 12 months    Cholesterol  Date Value Ref Range Status  06/03/2023 137 0 - 200 mg/dL Final  90/74/7985 792 (H) 0 - 200 mg/dL Final   Ldl Cholesterol, Calc  Date Value Ref Range Status  10/07/2012 127 (H) 0 - 100 mg/dL Final   LDL Cholesterol  Date Value Ref Range Status  06/03/2023 71 0 - 99 mg/dL Final    Comment:           Total Cholesterol/HDL:CHD Risk Coronary Heart Disease Risk Table                     Men   Women  1/2 Average Risk   3.4   3.3  Average Risk  5.0   4.4  2 X Average Risk   9.6   7.1  3 X Average Risk  23.4   11.0        Use the calculated Patient Ratio above and the CHD Risk Table to determine the patient's CHD Risk.        ATP III CLASSIFICATION (LDL):  <100     mg/dL   Optimal  899-870  mg/dL   Near or Above                    Optimal  130-159  mg/dL   Borderline  839-810  mg/dL   High  >809     mg/dL   Very High Performed at [facility], [address] [place], [place] [address]    HDL Cholesterol  Date Value Ref Range Status  10/07/2012 25 (L) 40 - 60 mg/dL Final   HDL  Date Value Ref Range Status  06/03/2023 31 (L) >40 mg/dL Final   Triglycerides  Date Value Ref Range Status  06/03/2023 177 (H) <150 mg/dL Final  90/74/7985 726 (H) 0 - 200 mg/dL Final         Passed - Patient is not pregnant      Passed - Valid encounter within last 12 months    Recent Outpatient Visits           5 days ago Bilateral buttock pain   [place] [facility] [place], [place] A, MD   1 month ago Bacterial conjunctivitis of both eyes   [place] [facility] [place], Marsa PARAS, DO   1 month ago Seizure [facility])   [place] [facility] [place],  Marsa PARAS, DO   3 months ago Lingular pneumonia   [place] [facility] [place], Marsa PARAS, DO   6 months ago Cryptogenic stroke [facility])   [place] [facility] [place], Marsa PARAS, DO              Refused Prescriptions Disp Refills   buPROPion  (WELLBUTRIN  XL) 150 MG 24 hr tablet [Pharmacy Med Name: buPROPion  HCl ER (XL) 150 MG Oral Tablet Extended Release 24 Hour] 100 tablet 2    Sig: TAKE 1 TABLET BY MOUTH DAILY     Psychiatry: Antidepressants - bupropion  Failed - 12/28/2023  7:39 AM      Failed - Cr in normal range and within 360 days    Creat  Date Value Ref Range Status  07/03/2023 0.61 0.50 - 1.03 mg/dL Final   Creatinine, Ser  Date Value Ref Range Status  10/26/2023 0.40 (L) 0.44 - 1.00 mg/dL Final         Failed - AST in normal range and within 360 days    AST  Date Value Ref Range Status  10/26/2023 12 (L) 15 - 41 U/L Final   SGOT(AST)  Date Value Ref Range Status  09/22/2013 17 15 - 37 Unit/L Final         Passed - ALT in normal range and within 360 days    ALT  Date Value Ref Range Status  10/26/2023 7 0 - 44 U/L Final   SGPT (ALT)  Date Value Ref Range Status  09/22/2013 16 U/L Final    Comment:    14-63 NOTE: New Reference Range 08/02/13          Passed - Completed PHQ-2 or PHQ-9 in the last 360 days  Passed - Last BP in normal range    BP Readings from Last 1 Encounters:  10/30/23 103/60         Passed - Valid encounter within last 6 months    Recent Outpatient Visits           5 days ago Bilateral buttock pain   [place] [facility] [place], [place], MD   1 month ago Bacterial conjunctivitis of both eyes   [place] [facility] [place], Marsa PARAS, DO   1 month ago Seizure [facility])   [place] [facility] [place] [facility]a PARAS, DO   3 months ago Lingular pneumonia   [place] [facility] [place] Marsa PARAS, DO   6 months ago Cryptogenic stroke [facility])   [place] [facility] [place], [place]a PARAS, [place]

## 2023-12-28 NOTE — Telephone Encounter (Signed)
 Requested medication (s) are due for refill today: na  Requested medication (s) are on the active medication list: yes   Last refill:  10/26/23   Future visit scheduled: yes 12/29/23  Notes to clinic:  last ordered by Devaughn Rozella Ban, MD. Do you want to order Rx?     Requested Prescriptions  Pending Prescriptions Disp Refills   clopidogrel  (PLAVIX ) 75 MG tablet [Pharmacy Med Name: Clopidogrel  Bisulfate 75 MG Oral Tablet] 100 tablet 2    Sig: TAKE 1 TABLET BY MOUTH DAILY AT  9AM     Hematology: Antiplatelets - clopidogrel  Failed - 12/28/2023  7:39 AM      Failed - HGB in normal range and within 180 days    Hemoglobin  Date Value Ref Range Status  10/26/2023 11.8 (L) 12.0 - 15.0 g/dL Final   HGB  Date Value Ref Range Status  09/22/2013 14.6 12.0 - 16.0 g/dL Final         Failed - Cr in normal range and within 360 days    Creat  Date Value Ref Range Status  07/03/2023 0.61 0.50 - 1.03 mg/dL Final   Creatinine, Ser  Date Value Ref Range Status  10/26/2023 0.40 (L) 0.44 - 1.00 mg/dL Final         Passed - HCT in normal range and within 180 days    HCT  Date Value Ref Range Status  10/26/2023 36.1 36.0 - 46.0 % Final  09/22/2013 44.3 35.0 - 47.0 % Final         Passed - PLT in normal range and within 180 days    Platelets  Date Value Ref Range Status  10/26/2023 271 150 - 400 K/uL Final   Platelet  Date Value Ref Range Status  09/22/2013 300 150 - 440 x10 3/mm 3 Final         Passed - Valid encounter within last 6 months    Recent Outpatient Visits           5 days ago Bilateral buttock pain   [facility] [place], Parris LABOR, MD   1 month ago Bacterial conjunctivitis of both eyes   [place] [facility] [place] Marsa PARAS, DO   1 month ago Seizure [facility])   [place] [place] Marsa PARAS, DO   3 months ago Lingular pneumonia   [place] [facility]  [place], Marsa PARAS, DO   6 months ago Cryptogenic stroke [facility])   [place] [facility] [place], Marsa PARAS, DO              Signed Prescriptions Disp Refills   atorvastatin  (LIPITOR) 40 MG tablet 90 tablet 0    Sig: TAKE 1 TABLET BY MOUTH AT  BEDTIME     Cardiovascular:  Antilipid - Statins Failed - 12/28/2023  7:39 AM      Failed - Lipid Panel in normal range within the last 12 months    Cholesterol  Date Value Ref Range Status  06/03/2023 137 0 - 200 mg/dL Final  90/74/7985 792 (H) 0 - 200 mg/dL Final   Ldl Cholesterol, Calc  Date Value Ref Range Status  10/07/2012 127 (H) 0 - 100 mg/dL Final   LDL Cholesterol  Date Value Ref Range Status  06/03/2023 71 0 - 99 mg/dL Final    Comment:           Total Cholesterol/HDL:CHD Risk Coronary Heart Disease  Risk Table                     Men   Women  1/2 Average Risk   3.4   3.3  Average Risk       5.0   4.4  2 X Average Risk   9.6   7.1  3 X Average Risk  23.4   11.0        Use the calculated Patient Ratio above and the CHD Risk Table to determine the patient's CHD Risk.        ATP III CLASSIFICATION (LDL):  <100     mg/dL   Optimal  899-870  mg/dL   Near or Above                    Optimal  130-159  mg/dL   Borderline  839-810  mg/dL   High  >809     mg/dL   Very High Performed at [facility], [address] [place], [place] [address]    HDL Cholesterol  Date Value Ref Range Status  10/07/2012 25 (L) 40 - 60 mg/dL Final   HDL  Date Value Ref Range Status  06/03/2023 31 (L) >40 mg/dL Final   Triglycerides  Date Value Ref Range Status  06/03/2023 177 (H) <150 mg/dL Final  90/74/7985 726 (H) 0 - 200 mg/dL Final         Passed - Patient is not pregnant      Passed - Valid encounter within last 12 months    Recent Outpatient Visits           5 days ago Bilateral buttock pain   [place] [place], [place]  A, MD   1 month ago Bacterial  conjunctivitis of both eyes   [place] [facility] [place], Marsa PARAS, DO   1 month ago Seizure [facility])   [place] [facility] [employer], Marsa PARAS, DO   3 months ago Lingular pneumonia   [place] [place], Marsa PARAS, DO   6 months ago Cryptogenic stroke [facility])   [place] [facility] [place], Marsa PARAS, DO              Refused Prescriptions Disp Refills   buPROPion  (WELLBUTRIN  XL) 150 MG 24 hr tablet [Pharmacy Med Name: buPROPion  HCl ER (XL) 150 MG Oral Tablet Extended Release 24 Hour] 100 tablet 2    Sig: TAKE 1 TABLET BY MOUTH DAILY     Psychiatry: Antidepressants - bupropion  Failed - 12/28/2023  7:39 AM      Failed - Cr in normal range and within 360 days    Creat  Date Value Ref Range Status  07/03/2023 0.61 0.50 - 1.03 mg/dL Final   Creatinine, Ser  Date Value Ref Range Status  10/26/2023 0.40 (L) 0.44 - 1.00 mg/dL Final         Failed - AST in normal range and within 360 days    AST  Date Value Ref Range Status  10/26/2023 12 (L) 15 - 41 U/L Final   SGOT(AST)  Date Value Ref Range Status  09/22/2013 17 15 - 37 Unit/L Final         Passed - ALT in normal range and within 360 days    ALT  Date Value Ref Range Status  10/26/2023 7 0 - 44 U/L Final   SGPT (ALT)  Date Value Ref Range Status  09/22/2013 16 U/L Final    Comment:    14-63 NOTE: New Reference Range 08/02/13          Passed - Completed PHQ-2 or PHQ-9 in the last 360 days      Passed - Last BP in normal range    BP Readings from Last 1 Encounters:  10/30/23 103/60         Passed - Valid encounter within last 6 months    Recent Outpatient Visits           5 days ago Bilateral buttock pain   [facility], Parris LABOR, MD   1 month ago Bacterial conjunctivitis of both eyes   [place] [facility] [place], Marsa PARAS, DO   1  month ago Seizure [facility])   [place] [facility] [place] Marsa PARAS, DO   3 months ago Lingular pneumonia   [place] [facility] [place] Marsa PARAS, DO   6 months ago Cryptogenic stroke [facility])   [place] [facility] [place], [place] PARAS, [place]

## 2023-12-29 ENCOUNTER — Ambulatory Visit: Admitting: Family Medicine

## 2024-01-08 ENCOUNTER — Ambulatory Visit: Payer: Self-pay

## 2024-01-08 NOTE — Telephone Encounter (Signed)
 Apt scheduled 12/29

## 2024-01-08 NOTE — Telephone Encounter (Signed)
 Increased depression over the last month, denies SI/HI, wants to adjust her medication (is on Wellbutrin  and Lexapro for the last 15 years), life triggers such as bedridden post stroke and no contact with her 2 adult sons.   FYI Only or Action Required?: Action required by provider: update on patient condition.  Patient was last seen in primary care on 12/23/2023 by Zafirov, Clarissa A, MD.  Called Nurse Triage reporting Depression.  Symptoms began about a month ago.  Interventions attempted: Nothing.  Symptoms are: gradually worsening.  Triage Disposition: See PCP When Office is Open (Within 3 Days), See Physician Within 24 Hours  Patient/caregiver understands and will follow disposition?: Yes   Copied from CRM #[id number]. Topic: Clinical - Red Word Triage >> Jan 08, 2024 10:25 AM Rosaria BRAVO wrote: Red Word that prompted transfer to Nurse Triage: depressed, medication not working. Reason for Disposition  [1] Depression AND [2] getting worse (e.g., sleeping poorly, less able to do activities of daily living)  Answer Assessment - Initial Assessment Questions 1. CONCERN: What happened that made you call today?     Wants to talk to Dr MARLA about switching up depression medication (currently on Wellbutrin  and Lexapro for the last 15 years). Reports life stressors such as being bedridden post 2 strokes and her two adult sons going no contact with her.   2. DEPRESSION SYMPTOM SCREENING: How are you feeling overall? (e.g., decreased energy, increased sleeping or difficulty sleeping, difficulty concentrating, feelings of sadness, guilt, hopelessness, or worthlessness)     Pt states I have very bad difficulty sleeping, concentrating, also reports Hx of stroke that affects her memory  3. RISK OF HARM - SUICIDAL IDEATION:  Do you ever have thoughts of hurting or killing yourself?  (e.g., yes, no, no but preoccupation with thoughts about death)     Denies SI   4. RISK OF HARM - HOMICIDAL  IDEATION:  Do you ever have thoughts of hurting or killing someone else?  (e.g., yes, no, no but preoccupation with thoughts about death)     Denies HI  5. FUNCTIONAL IMPAIRMENT: How have things been going for you overall? Have you had more difficulty than usual doing your normal daily activities?  (e.g., better, same, worse; self-care, school, work, interactions)     Pt reports she is bedridden with R sided deficits and normally isn't able to perform many ADLs  6. SUPPORT: Who is with you now? Who do you live with? Do you have family or friends who you can talk to?      Her husband  7. THERAPIST: Do you have a counselor or therapist? If Yes, ask: What is their name?     Denies  8. STRESSORS: Has there been any new stress or recent changes in your life?     Post stroke she is bedridden and suffers R sided deficits; two grown sons are not speaking to her  9. ALCOHOL  USE: Do you drink alcohol  ?     Denies  10. OTHER: Do you have any other physical symptoms right now? (e.g., fever) Denies  Increased depression x 1 month Bedridden due to R side deficit  Protocols used: Depression-A-AH

## 2024-01-11 ENCOUNTER — Ambulatory Visit: Admitting: Family Medicine

## 2024-01-12 ENCOUNTER — Telehealth: Payer: Self-pay

## 2024-01-12 ENCOUNTER — Ambulatory Visit: Admitting: Podiatry

## 2024-01-12 NOTE — Telephone Encounter (Signed)
 Copied from CRM #[id number]. Topic: Clinical - Red Word Triage >> Jan 08, 2024 10:25 AM Rosaria BRAVO wrote: Red Word that prompted transfer to Nurse Triage: depressed, medication not working. >> Jan 11, 2024  4:01 PM Shanda MATSU wrote: Patient missed appt scheduled by NT, is now calling to reschedule, patient did not want to wait for NT to answer, stated she will call back.

## 2024-01-12 NOTE — Telephone Encounter (Signed)
 Spoke with patient, appointment scheduled for 1/14 at 320 virtual.

## 2024-01-13 ENCOUNTER — Ambulatory Visit (INDEPENDENT_AMBULATORY_CARE_PROVIDER_SITE_OTHER)

## 2024-01-13 ENCOUNTER — Ambulatory Visit

## 2024-01-13 DIAGNOSIS — L97511 Non-pressure chronic ulcer of other part of right foot limited to breakdown of skin: Secondary | ICD-10-CM | POA: Diagnosis not present

## 2024-01-13 DIAGNOSIS — Z89412 Acquired absence of left great toe: Secondary | ICD-10-CM

## 2024-01-13 DIAGNOSIS — L97521 Non-pressure chronic ulcer of other part of left foot limited to breakdown of skin: Secondary | ICD-10-CM

## 2024-01-13 MED ORDER — DOXYCYCLINE HYCLATE 100 MG PO TABS: 100.0000 mg | ORAL_TABLET | Freq: Two times a day (BID) | ORAL | 0 refills | Status: AC

## 2024-01-13 NOTE — Progress Notes (Signed)
 "  Subjective:  Patient ID: [name], female    DOB: [birth year],  MRN: [id number]  Chief Complaint  Patient presents with   Foot Ulcer    Rm8 patient complains of pain and wound on left 2nd toe for two months    Discussed the use of AI scribe software for clinical note transcription with the patient, who gave verbal consent to proceed.  History of Present Illness [name] is a 58 year old female with type 2 diabetes and peripheral arterial disease who presents for evaluation of chronic wounds of the bilateral feet.  For the past two months, she has had a persistent wound at the cuticle of the left second toe that began as a peeling cuticle and progressed to a stable eschar at the proximal medial nail fold. She denies purulence, drainage, erythema, edema, or other signs of infection and has not used antibiotics.  She is largely homebound, does not wear shoes at home, and is usually barefoot or in bed.  Her type 2 diabetes is well controlled. She has peripheral arterial disease with stents placed in two locations eight to nine months ago and has not followed up with vascular surgery since. She continues to smoke.     Review of Systems: Negative except as noted in the HPI. Denies N/V/F/Ch.  Past Medical History:  Diagnosis Date   Anxiety    Cancer (HCC)    a spot on liver and treated    Complication of anesthesia    restless,easily upset   COPD (chronic obstructive pulmonary disease) (HCC)    Depression    Diabetes mellitus without complication (HCC)    Diverticulitis    Fatty liver    GERD (gastroesophageal reflux disease)    Headache(784.0)    migraines   History of kidney stones    Hyperlipidemia    Hypertension    PAD (peripheral artery disease)    Pneumonia    Restless    Stroke (HCC)    Current Medications[1]  Tobacco Use History[2]  Allergies[3] Objective:   Constitutional Well developed. Well nourished. Oriented to person, place, and time.   Vascular Dorsalis pedis pulses non-palpable bilaterally. Posterior tibial pulses nonpalpable bilaterally. Capillary refill normal to all digits.  No cyanosis or clubbing noted. Pedal hair growth normal.  Neurologic Normal speech. Epicritic sensation to light touch grossly diminished bilaterally. Negative tinel sign at tarsal tunnel bilaterally.   Dermatologic Skin texture and turgor are within normal limits.  Stable eschar to dorsal aspect of left second proximal interphalangeal joint.  No sign of acute infection is noted.  Does not probe deep.  Minimal erythema, edema and periwound area. Right hallux proximal nail has small stable eschar without any signs of infection.   Musculoskeletal: No contributing deformity, deferred.  Right ankle is in brace.   Radiographs: Taken and reviewed.  3 views of the left foot were acquired.  These are difficult due to patient's inability to place weight on her right foot with brace.  There are no cortical erosions that are identified to the second digit.  No sign of soft tissue emphysema.  No acute osseous pathology such as fracture or dislocation.       Assessment:   1. Ulcer of left foot, limited to breakdown of skin (HCC)   2. History of amputation of left great toe   3. Ulcer of right foot limited to breakdown of skin [facility])      Plan:  Patient was evaluated and treated and  all questions answered.  Assessment and Plan Assessment & Plan Chronic ulcer of left foot Chronic, non-healing ulcer with stable eschar, no active infection. Peripheral arterial disease and diabetes impair blood flow, requiring vascular assessment. - Prescribed doxycycline for potential subclinical infection. 7 days 100mg  BID.  - Instructed to report signs of infection: purulent drainage, erythema, swelling. - Emphasized need for vascular specialist follow-up for perfusion evaluation. They express understanding of this.   Eschar of right hallux Small eschar at  proximal medial nail fold, protective, no infection or significant tissue compromise. - Documented stable eschar without infection. - Advised to report changes: purulent drainage, erythema, swelling.  RTC PRN or if there is any worsening  Prentice Ovens, DPM AACFAS Fellowship Trained Podiatric Surgeon [facility]     [1]  Current Outpatient Medications:    albuterol  (VENTOLIN  HFA) 108 (90 Base) MCG/ACT inhaler, INHALE 2 INHALATIONS BY MOUTH  EVERY 4 HOURS AS NEEDED FOR  WHEEZING OR FOR SHORTNESS OF  BREATH, Disp: 51 g, Rfl: 1   atorvastatin  (LIPITOR) 40 MG tablet, TAKE 1 TABLET BY MOUTH AT  BEDTIME, Disp: 90 tablet, Rfl: 0   bisacodyl  5 MG EC tablet, Take 2 tablets (10 mg total) by mouth daily as needed for moderate constipation., Disp: 30 tablet, Rfl: 0   citalopram  (CELEXA ) 40 MG tablet, TAKE 1 TABLET BY MOUTH DAILY, Disp: 100 tablet, Rfl: 1   clopidogrel  (PLAVIX ) 75 MG tablet, TAKE 1 TABLET BY MOUTH DAILY AT  9AM, Disp: 100 tablet, Rfl: 2   doxycycline (VIBRA-TABS) 100 MG tablet, Take 1 tablet (100 mg total) by mouth 2 (two) times daily for 7 days., Disp: 14 tablet, Rfl: 0   fluconazole  (DIFLUCAN ) 150 MG tablet, TAKE 1 TABLET BY MOUTH ON DAY 1 . REPEAT2ND DOSE ON DAY 3, Disp: 2 tablet, Rfl: 1   fluticasone  (FLONASE ) 50 MCG/ACT nasal spray, Place 2 sprays into both nostrils daily. Use for 4-6 weeks then stop and use seasonally or as needed., Disp: 16 g, Rfl: 0   gabapentin  (NEURONTIN ) 600 MG tablet, TAKE 1 TABLET BY MOUTH 4 TIMES  DAILY, Disp: 400 tablet, Rfl: 2   levETIRAcetam  (KEPPRA ) 500 MG tablet, Take 1 tablet (500 mg total) by mouth 2 (two) times daily., Disp: 180 tablet, Rfl: 1   midodrine  (PROAMATINE ) 5 MG tablet, Take 1 tablet (5 mg total) by mouth 3 (three) times daily with meals., Disp: 21 tablet, Rfl: 0   MOUNJARO  10 MG/0.5ML Pen, INJECT THE CONTENTS OF ONE PEN  SUBCUTANEOUSLY WEEKLY AS  DIRECTED, Disp: 6 mL, Rfl: 3   nicotine  (NICODERM CQ  - DOSED IN MG/24 HOURS)  14 mg/24hr patch, Place onto the skin., Disp: , Rfl:    ondansetron  (ZOFRAN -ODT) 4 MG disintegrating tablet, DISSOLVE 1 TABLET ON THE TONGUE EVERY 8 HOURS AS NEEDED FOR NAUSEA AND VOMITING, Disp: 30 tablet, Rfl: 2   oxyCODONE -acetaminophen  (PERCOCET) 10-325 MG tablet, Take 1 tablet by mouth every 6 (six) hours as needed for pain., Disp: , Rfl:    pantoprazole  (PROTONIX ) 40 MG tablet, TAKE 1 TABLET BY MOUTH DAILY AT  9 AM, Disp: 100 tablet, Rfl: 1   rizatriptan  (MAXALT -MLT) 10 MG disintegrating tablet, Take 1 tablet (10 mg total) by mouth as needed. May repeat in 2 hours if needed, Disp: 10 tablet, Rfl: 2   TRELEGY ELLIPTA  100-62.5-25 MCG/ACT AEPB, USE 1 INHALATION BY MOUTH ONCE  DAILY AT THE SAME TIME EACH DAY, Disp: 180 each, Rfl: 3   trimethoprim -polymyxin b  (POLYTRIM ) ophthalmic solution,  Place 1 drop into both eyes 4 (four) times daily. For up to 7 to 10 days., Disp: 10 mL, Rfl: 0   leptospermum manuka honey (MEDIHONEY) gel, Apply 1 Application topically daily. (Patient not taking: Reported on 01/13/2024), Disp: 44 mL, Rfl: 0   naloxone  (NARCAN ) nasal spray 4 mg/0.1 mL, Place 1 spray into the nose once. (Patient not taking: Reported on 01/13/2024), Disp: , Rfl:    nystatin  (MYCOSTATIN /NYSTOP ) powder, Apply 1 Application topically 3 (three) times daily. (Patient not taking: Reported on 01/13/2024), Disp: 15 g, Rfl: 0   Vitamin D , Ergocalciferol , (DRISDOL ) 1.25 MG (50000 UNIT) CAPS capsule, TAKE 1 CAPSULE BY MOUTH EVERY WEDNESDAY @9AM  (Patient not taking: Reported on 01/13/2024), Disp: 4 capsule, Rfl: 11 [2]  Social History Tobacco Use  Smoking Status Every Day   Current packs/day: 2.00   Average packs/day: 2.0 packs/day for 47.0 years (94.0 ttl pk-yrs)   Types: Cigarettes   Start date: 1979  Smokeless Tobacco Never  [3]  Allergies Allergen Reactions   Tramadol Nausea And Vomiting and Hypertension   Augmentin [Amoxicillin-Pot Clavulanate] Rash    Tolerated Zosyn  03/26/23   "

## 2024-01-18 ENCOUNTER — Telehealth (INDEPENDENT_AMBULATORY_CARE_PROVIDER_SITE_OTHER): Payer: Self-pay

## 2024-01-18 ENCOUNTER — Other Ambulatory Visit: Payer: Self-pay | Admitting: Family Medicine

## 2024-01-18 NOTE — Telephone Encounter (Signed)
 Patient left a message stating that she was advised by her podiatrist to follow up with our office due to hardly able to feel pulse. Patient will should be scheduled with abi and ble arterial follow up with provider per Orvin NP.

## 2024-01-20 ENCOUNTER — Other Ambulatory Visit: Payer: Self-pay

## 2024-01-20 NOTE — Telephone Encounter (Signed)
 Requested medication (s) are due for refill today: yes  Requested medication (s) are on the active medication list: yes  Last refill:  02/17/23  Future visit scheduled: {Yes  Notes to clinic:   Medication not assigned to a protocol, review manually.      Requested Prescriptions  Pending Prescriptions Disp Refills   TRELEGY ELLIPTA  100-62.5-25 MCG/ACT AEPB [Pharmacy Med Name: Trelegy Ellipta  100-62.5-25 MCG/INH Inhalation Aerosol Powder Breath Activated] 180 each 3    Sig: USE 1 INHALATION BY MOUTH ONCE  DAILY AT THE SAME TIME EACH DAY     Off-Protocol Failed - 01/20/2024 11:39 AM      Failed - Medication not assigned to a protocol, review manually.      Passed - Valid encounter within last 12 months    Recent Outpatient Visits           4 weeks ago Bilateral buttock pain   [place] [facility] [place], [place], MD   1 month ago Bacterial conjunctivitis of both eyes   [place] [facility] [place], Marsa PARAS, DO   2 months ago [facility]izure [facility])   [place] [facility] [place]a PARAS, DO   3 months ago Lingular pneumonia   [place] [facility] [place] PARAS, DO   7 months ago Cryptogenic stroke [facility])    [facility] [place], Marsa PARAS, [place]

## 2024-01-21 ENCOUNTER — Ambulatory Visit: Admitting: Student in an Organized Health Care Education/Training Program

## 2024-01-21 NOTE — Telephone Encounter (Signed)
 We can look and see if we can move up her studies but if she feels that she needs immediate evaluation she can proceed to the ED

## 2024-01-21 NOTE — Telephone Encounter (Signed)
 Patient called at this time concerned about her foot stating she seen her podiatrist last week and she currently has no pulse in the top of her foot, she stated she has an appointment 02/02/24, but feels as if that is too far out being as she has no pulse in this foot. Please advise or send to ED?

## 2024-01-25 ENCOUNTER — Encounter: Payer: Self-pay | Admitting: Family Medicine

## 2024-01-26 ENCOUNTER — Ambulatory Visit: Admitting: Podiatry

## 2024-01-26 ENCOUNTER — Ambulatory Visit: Admitting: Family Medicine

## 2024-01-26 NOTE — Telephone Encounter (Signed)
 Patient left voicemail on nurse triage line to report she is having pain and numbness in the foot and wants to know if she should be concerned. Per Chart notes patient was to be offered to move up her studies. Call to patient she reports that she was offered an appointment on Thursday of this week but she needs 48 hours notice to secure transportation. Asked if patient is able to schedule for Friday this week instead as that would give her 48 hours if the schedule permits. Patient declined advised she has other obligations in [place] on Friday of this week. Advised patient her only option unfortunately if she is unable to schedule here would be the ED. Patient reports she will look at her schedule and see what she can move around or decide to go to the ED.

## 2024-01-27 ENCOUNTER — Telehealth: Admitting: Family Medicine

## 2024-01-27 ENCOUNTER — Encounter: Payer: Self-pay | Admitting: Family Medicine

## 2024-01-27 ENCOUNTER — Telehealth: Payer: Self-pay

## 2024-01-27 DIAGNOSIS — G40909 Epilepsy, unspecified, not intractable, without status epilepticus: Secondary | ICD-10-CM | POA: Diagnosis not present

## 2024-01-27 DIAGNOSIS — F331 Major depressive disorder, recurrent, moderate: Secondary | ICD-10-CM | POA: Diagnosis not present

## 2024-01-27 DIAGNOSIS — I69951 Hemiplegia and hemiparesis following unspecified cerebrovascular disease affecting right dominant side: Secondary | ICD-10-CM

## 2024-01-27 DIAGNOSIS — J432 Centrilobular emphysema: Secondary | ICD-10-CM | POA: Diagnosis not present

## 2024-01-27 DIAGNOSIS — G894 Chronic pain syndrome: Secondary | ICD-10-CM

## 2024-01-27 DIAGNOSIS — E782 Mixed hyperlipidemia: Secondary | ICD-10-CM | POA: Diagnosis not present

## 2024-01-27 DIAGNOSIS — R413 Other amnesia: Secondary | ICD-10-CM

## 2024-01-27 DIAGNOSIS — I6930 Unspecified sequelae of cerebral infarction: Secondary | ICD-10-CM

## 2024-01-27 MED ORDER — GABAPENTIN 300 MG PO CAPS: 300.0000 mg | ORAL_CAPSULE | Freq: Three times a day (TID) | ORAL | 2 refills | Status: AC

## 2024-01-27 MED ORDER — DULOXETINE HCL 30 MG PO CPEP: 30.0000 mg | ORAL_CAPSULE | Freq: Every day | ORAL | 2 refills | Status: AC

## 2024-01-27 MED ORDER — ATORVASTATIN CALCIUM 40 MG PO TABS: 40.0000 mg | ORAL_TABLET | Freq: Every day | ORAL | 1 refills | Status: AC

## 2024-01-27 NOTE — Progress Notes (Signed)
 "  Subjective:    Patient ID: [name], female    DOB: 08/29/1965, 59 y.o.   MRN: [id number]  [name] is a 59 y.o. female presenting on 01/27/2024 for Depression   Virtual / Telehealth Encounter - Video Visit via MyChart The purpose of this virtual visit is to provide medical care while limiting exposure to the novel coronavirus (COVID19) for both patient and office staff.  Consent was obtained for remote visit:  Yes.   Answered questions that patient had about telehealth interaction:  Yes.   I discussed the limitations, risks, security and privacy concerns of performing an evaluation and management service by video/telephone. I also discussed with the patient that there may be a patient responsible charge related to this service. The patient expressed understanding and agreed to proceed.  Patient Location: Home Provider Location: [facility] (Office)  Participants in virtual visit: - Patient: [name] - CMA: Alan Fontana CMA - Provider: Dr Edman   HPI  Discussed the use of AI scribe software for clinical note transcription with the patient, who gave verbal consent to proceed.  History of Present Illness   [name] is a 58 year old female who presents with worsening memory and mood issues after discontinuing antidepressants. She is accompanied by her husband.  Major Depression recurrent moderate Memory Loss and mood disturbances - Worsening memory and mood issues, note she discontinued antidepressants three months ago - Discontinued citalopram  40 mg and bupropion  150 mg extended release after 20 years of use due to perceived lack of efficacy - Husband observed improvement while on antidepressants, but she did not perceive benefit - Feels overwhelmed and confused by her medication regimen  Chronic pain and neuropathy Followed by Pain Management and vascular / has upcoming Neurology visit - Chronic pain and neuropathy, predominantly in  the feet - Describes feet as 'can't hardly feel my feet, but I can' - Previously on gabapentin  600 mg four times daily; considering dose reduction to 300 mg three times daily - Uses oxycodone  for pain management  Insomnia / Sleep disturbance - Poor sleep, averaging four to five hours per night - Frequently does not sleep until daylight  History of CVA Stroke, Residual Hemiparesis She discontinued Plavix . Asking about resuming and has upcoming Neurology apt  Medication management issues - Currently taking Keppra  for seizures, Mounjaro  weekly, and Trelegy for emphysema - Has bottles of atorvastatin  and Plavix  but has not been taking them regularly - Uses pantoprazole  as needed for stomach acid - Feels overwhelmed and confused by the complexity of her medication regimen          01/27/2024    4:22 PM 06/23/2023    2:47 PM 05/22/2023    3:28 PM  Depression screen PHQ 2/9  Decreased Interest 1 1 2   Down, Depressed, Hopeless 1 1 2   PHQ - 2 Score 2 2 4   Altered sleeping 3 3   Tired, decreased energy 3 3 2   Change in appetite 1 2   Feeling bad or failure about yourself  1 2   Trouble concentrating 1 3   Moving slowly or fidgety/restless 0 3   Suicidal thoughts 0 0   PHQ-9 Score 11 18    Difficult doing work/chores Somewhat difficult Somewhat difficult Somewhat difficult     Data saved with a previous flowsheet row definition       06/23/2023    2:47 PM 05/06/2022    2:44 PM 05/15/2021  1:59 PM 04/17/2021    2:35 PM  GAD 7 : Generalized Anxiety Score  Nervous, Anxious, on Edge 1 1 2 2   Control/stop worrying 2 1 0 0  Worry too much - different things 0 1 2 1   Trouble relaxing 3 1 3 3   Restless 0 1 3 3   Easily annoyed or irritable 3 2 3 3   Afraid - awful might happen 0 0 0 0  Total GAD 7 Score 9 7 13 12   Anxiety Difficulty Not difficult at all Not difficult at all Somewhat difficult Not difficult at all    Social History[1]  Review of Systems Per HPI unless specifically  indicated above     Objective:    There were no vitals taken for this visit.  Wt Readings from Last 3 Encounters:  10/30/23 200 lb (90.7 kg)  10/22/23 198 lb 6.6 oz (90 kg)  10/19/23 200 lb (90.7 kg)     Physical Exam  Note examination was completely remotely via video observation objective data only  Gen - well-appearing, no acute distress or apparent pain, comfortable HEENT - eyes appear clear without discharge or redness Heart/Lungs - cannot examine virtually - observed no evidence of coughing or labored breathing. Abd - cannot examine virtually  Skin - face visible today- no rash Neuro - awake, alert, oriented Psych - not anxious appearing   Results for orders placed or performed during the hospital encounter of 10/22/23  CBC with Differential   Collection Time: 10/22/23 10:35 PM  Result Value Ref Range   WBC 10.8 (H) 4.0 - 10.5 K/uL   RBC 4.99 3.87 - 5.11 MIL/uL   Hemoglobin 14.0 12.0 - 15.0 g/dL   HCT 57.2 63.9 - 53.9 %   MCV 85.6 80.0 - 100.0 fL   MCH 28.1 26.0 - 34.0 pg   MCHC 32.8 30.0 - 36.0 g/dL   RDW 85.3 88.4 - 84.4 %   Platelets 340 150 - 400 K/uL   nRBC 0.2 0.0 - 0.2 %   Neutrophils Relative % 80 %   Neutro Abs 8.6 (H) 1.7 - 7.7 K/uL   Lymphocytes Relative 14 %   Lymphs Abs 1.5 0.7 - 4.0 K/uL   Monocytes Relative 5 %   Monocytes Absolute 0.6 0.1 - 1.0 K/uL   Eosinophils Relative 0 %   Eosinophils Absolute 0.0 0.0 - 0.5 K/uL   Basophils Relative 0 %   Basophils Absolute 0.0 0.0 - 0.1 K/uL   Immature Granulocytes 1 %   Abs Immature Granulocytes 0.05 0.00 - 0.07 K/uL  Comprehensive metabolic panel   Collection Time: 10/22/23 10:35 PM  Result Value Ref Range   Sodium 136 135 - 145 mmol/L   Potassium 3.9 3.5 - 5.1 mmol/L   Chloride 101 98 - 111 mmol/L   CO2 20 (L) 22 - 32 mmol/L   Glucose, Bld 185 (H) 70 - 99 mg/dL   BUN 10 6 - 20 mg/dL   Creatinine, Ser 9.27 0.44 - 1.00 mg/dL   Calcium  9.2 8.9 - 10.3 mg/dL   Total Protein 7.3 6.5 - 8.1 g/dL    Albumin 3.7 3.5 - 5.0 g/dL   AST 21 15 - 41 U/L   ALT 11 0 - 44 U/L   Alkaline Phosphatase 60 38 - 126 U/L   Total Bilirubin 0.6 0.0 - 1.2 mg/dL   GFR, Estimated >39 >39 mL/min   Anion gap 15 5 - 15  Urine Drug Screen, Qualitative ([facility] only)   Collection Time:  10/23/23 12:12 AM  Result Value Ref Range   Tricyclic, Ur Screen NONE DETECTED NONE DETECTED   Amphetamines, Ur Screen NONE DETECTED NONE DETECTED   MDMA (Ecstasy)Ur Screen NONE DETECTED NONE DETECTED   Cocaine Metabolite,Ur [place] NONE DETECTED NONE DETECTED   Opiate, Ur Screen POSITIVE (A) NONE DETECTED   Phencyclidine (PCP) Ur S NONE DETECTED NONE DETECTED   Cannabinoid 50 Ng, Ur [place] POSITIVE (A) NONE DETECTED   Barbiturates, Ur Screen NONE DETECTED NONE DETECTED   Benzodiazepine, Ur Scrn NONE DETECTED NONE DETECTED   Methadone Scn, Ur NONE DETECTED NONE DETECTED  Urinalysis, Complete w Microscopic -Urine, Clean Catch   Collection Time: 10/23/23 12:12 AM  Result Value Ref Range   Color, Urine YELLOW (A) YELLOW   APPearance CLEAR (A) CLEAR   Specific Gravity, Urine 1.032 (H) 1.005 - 1.030   pH 5.0 5.0 - 8.0   Glucose, UA NEGATIVE NEGATIVE mg/dL   Hgb urine dipstick NEGATIVE NEGATIVE   Bilirubin Urine NEGATIVE NEGATIVE   Ketones, ur 5 (A) NEGATIVE mg/dL   Protein, ur 899 (A) NEGATIVE mg/dL   Nitrite NEGATIVE NEGATIVE   Leukocytes,Ua NEGATIVE NEGATIVE   RBC / HPF 0-5 0 - 5 RBC/hpf   WBC, UA 0-5 0 - 5 WBC/hpf   Bacteria, UA RARE (A) NONE SEEN   Squamous Epithelial / HPF 0-5 0 - 5 /HPF   Mucus PRESENT   Culture, blood (x 2)   Collection Time: 10/23/23  4:19 AM   Specimen: BLOOD  Result Value Ref Range   Specimen Description BLOOD BLOOD RIGHT HAND    Special Requests      BOTTLES DRAWN AEROBIC AND ANAEROBIC Blood Culture results may not be optimal due to an inadequate volume of blood received in culture bottles   Culture      NO GROWTH 5 DAYS Performed at [facility], [address] [place], [place]  [address]    Report Status 10/28/2023 FINAL   Culture, blood (x 2)   Collection Time: 10/23/23  4:19 AM   Specimen: BLOOD  Result Value Ref Range   Specimen Description BLOOD BLOOD LEFT HAND    Special Requests      BOTTLES DRAWN AEROBIC AND ANAEROBIC Blood Culture results may not be optimal due to an inadequate volume of blood received in culture bottles   Culture      NO GROWTH 5 DAYS Performed at [facility], [address] [place], [place] [address]    Report Status 10/28/2023 FINAL   CBC with Differential   Collection Time: 10/23/23  4:19 AM  Result Value Ref Range   WBC 13.7 (H) 4.0 - 10.5 K/uL   RBC 4.78 3.87 - 5.11 MIL/uL   Hemoglobin 13.2 12.0 - 15.0 g/dL   HCT 58.9 63.9 - 53.9 %   MCV 85.8 80.0 - 100.0 fL   MCH 27.6 26.0 - 34.0 pg   MCHC 32.2 30.0 - 36.0 g/dL   RDW 85.4 88.4 - 84.4 %   Platelets 377 150 - 400 K/uL   nRBC 0.0 0.0 - 0.2 %   Neutrophils Relative % 71 %   Neutro Abs 9.6 (H) 1.7 - 7.7 K/uL   Lymphocytes Relative 23 %   Lymphs Abs 3.2 0.7 - 4.0 K/uL   Monocytes Relative 6 %   Monocytes Absolute 0.8 0.1 - 1.0 K/uL   Eosinophils Relative 0 %   Eosinophils Absolute 0.0 0.0 - 0.5 K/uL   Basophils Relative 0 %   Basophils  Absolute 0.0 0.0 - 0.1 K/uL   Immature Granulocytes 0 %   Abs Immature Granulocytes 0.06 0.00 - 0.07 K/uL  Comprehensive metabolic panel   Collection Time: 10/23/23  4:19 AM  Result Value Ref Range   Sodium 136 135 - 145 mmol/L   Potassium 3.7 3.5 - 5.1 mmol/L   Chloride 101 98 - 111 mmol/L   CO2 21 (L) 22 - 32 mmol/L   Glucose, Bld 127 (H) 70 - 99 mg/dL   BUN 15 6 - 20 mg/dL   Creatinine, Ser 9.33 0.44 - 1.00 mg/dL   Calcium  9.0 8.9 - 10.3 mg/dL   Total Protein 7.3 6.5 - 8.1 g/dL   Albumin 3.6 3.5 - 5.0 g/dL   AST 16 15 - 41 U/L   ALT 10 0 - 44 U/L   Alkaline Phosphatase 57 38 - 126 U/L   Total Bilirubin 0.6 0.0 - 1.2 mg/dL   GFR, Estimated >39 >39 mL/min   Anion gap 14 5 - 15  Lactic acid, plasma   Collection  Time: 10/23/23  4:19 AM  Result Value Ref Range   Lactic Acid, Venous 2.4 (HH) 0.5 - 1.9 mmol/L  Protime-INR   Collection Time: 10/23/23  4:19 AM  Result Value Ref Range   Prothrombin Time 15.6 (H) 11.4 - 15.2 seconds   INR 1.2 0.8 - 1.2  APTT   Collection Time: 10/23/23  4:19 AM  Result Value Ref Range   aPTT 31 24 - 36 seconds  Lactic acid, plasma   Collection Time: 10/23/23  5:51 AM  Result Value Ref Range   Lactic Acid, Venous 1.7 0.5 - 1.9 mmol/L  Resp panel by RT-PCR (RSV, Flu A&B, Covid) Anterior Nasal Swab   Collection Time: 10/23/23  8:57 AM   Specimen: Anterior Nasal Swab  Result Value Ref Range   SARS Coronavirus 2 by RT PCR NEGATIVE NEGATIVE   Influenza A by PCR NEGATIVE NEGATIVE   Influenza B by PCR NEGATIVE NEGATIVE   Resp Syncytial Virus by PCR NEGATIVE NEGATIVE  CBG monitoring, ED   Collection Time: 10/23/23  3:15 PM  Result Value Ref Range   Glucose-Capillary 167 (H) 70 - 99 mg/dL  Comprehensive metabolic panel   Collection Time: 10/23/23  3:17 PM  Result Value Ref Range   Sodium 134 (L) 135 - 145 mmol/L   Potassium 4.0 3.5 - 5.1 mmol/L   Chloride 101 98 - 111 mmol/L   CO2 21 (L) 22 - 32 mmol/L   Glucose, Bld 161 (H) 70 - 99 mg/dL   BUN 12 6 - 20 mg/dL   Creatinine, Ser 9.32 0.44 - 1.00 mg/dL   Calcium  8.4 (L) 8.9 - 10.3 mg/dL   Total Protein 6.5 6.5 - 8.1 g/dL   Albumin 3.2 (L) 3.5 - 5.0 g/dL   AST 18 15 - 41 U/L   ALT 11 0 - 44 U/L   Alkaline Phosphatase 48 38 - 126 U/L   Total Bilirubin 1.1 0.0 - 1.2 mg/dL   GFR, Estimated >39 >39 mL/min   Anion gap 12 5 - 15  Protein and glucose, CSF   Collection Time: 10/23/23  3:31 PM  Result Value Ref Range   Glucose, CSF 66 40 - 70 mg/dL   Total  Protein, CSF 52 (H) 15 - 45 mg/dL  Culture, fungus without smear   Collection Time: 10/23/23  5:31 PM   Specimen: CSF; Other  Result Value Ref Range   Specimen Description  CSF Performed at [facility], [address], [place], [place]  [address]    Special Requests      Normal Performed at [facility], [address] [place], [place] [address]    Culture      NO FUNGUS ISOLATED AFTER 21 DAYS Performed at [facility], [address] [place] [place], [place] [address]    Report Status 11/13/2023 FINAL   CSF culture w Gram Stain   Collection Time: 10/23/23  5:31 PM   Specimen: PATH Cytology CSF; Cerebrospinal Fluid  Result Value Ref Range   Specimen Description      CSF Performed at [facility], [address], [place], [place] [address]    Special Requests      NONE Performed at [facility], [address] [place], [place] [address]    Gram Stain      WBC SEEN RED BLOOD CELLS PRESENT NO ORGANISMS SEEN Performed at [facility], [address], [place], [place] [address]    Culture      NO GROWTH 3 DAYS Performed at [facility], [address] [place], [place], [place] [address]    Report Status 10/27/2023 FINAL   Cryptococcal antigen, CSF   Collection Time: 10/23/23  5:31 PM  Result Value Ref Range   Crypto Ag NEGATIVE NEGATIVE   Cryptococcal Ag Titer NOT INDICATED NOT INDICATED  CSF cell count with differential   Collection Time: 10/23/23  5:31 PM  Result Value Ref Range   Tube # 3    Color, CSF CLEAR (A) COLORLESS   Appearance, CSF COLORLESS (A) CLEAR   Supernatant NOT INDICATED    RBC Count, CSF 5 (H) 0 - 3 /cu mm   WBC, CSF 2 0 - 5 /cu mm   Other Cells, CSF TOO FEW TO COUNT, SMEAR AVAILABLE FOR REVIEW   Meningitis/Encephalitis Panel (CSF)   Collection Time: 10/23/23  5:31 PM  Result Value Ref Range   Cryptococcus neoformans/gattii (CSF) NOT DETECTED NOT DETECTED   Cytomegalovirus (CSF) NOT DETECTED NOT DETECTED   Enterovirus (CSF) NOT DETECTED NOT DETECTED   Escherichia coli K1 (CSF) NOT DETECTED NOT DETECTED   Haemophilus influenzae (CSF) NOT DETECTED NOT DETECTED   Herpes simplex virus 1 (CSF) NOT DETECTED NOT DETECTED   Herpes simplex virus 2  (CSF) NOT DETECTED NOT DETECTED   Human herpesvirus 6 (CSF) NOT DETECTED NOT DETECTED   Human parechovirus (CSF) NOT DETECTED NOT DETECTED   Listeria monocytogenes (CSF) NOT DETECTED NOT DETECTED   Neisseria meningitis (CSF) NOT DETECTED NOT DETECTED   Streptococcus agalactiae (CSF) NOT DETECTED NOT DETECTED   Streptococcus pneumoniae (CSF) NOT DETECTED NOT DETECTED   Varicella zoster virus (CSF) NOT DETECTED NOT DETECTED  CBG monitoring, ED   Collection Time: 10/23/23  6:48 PM  Result Value Ref Range   Glucose-Capillary 104 (H) 70 - 99 mg/dL  CBG monitoring, ED   Collection Time: 10/24/23 12:22 AM  Result Value Ref Range   Glucose-Capillary 97 70 - 99 mg/dL  CBC   Collection Time: 10/24/23  2:24 AM  Result Value Ref Range   WBC 9.8 4.0 - 10.5 K/uL   RBC 4.13 3.87 - 5.11 MIL/uL   Hemoglobin 11.7 (L) 12.0 - 15.0 g/dL   HCT 64.1 (L) 63.9 - 53.9 %   MCV 86.7 80.0 - 100.0 fL   MCH 28.3 26.0 - 34.0 pg   MCHC 32.7 30.0 - 36.0 g/dL   RDW 85.3 88.4 -  15.5 %   Platelets 286 150 - 400 K/uL   nRBC 0.0 0.0 - 0.2 %  Magnesium    Collection Time: 10/24/23  2:24 AM  Result Value Ref Range   Magnesium  1.7 1.7 - 2.4 mg/dL  Creatinine, serum   Collection Time: 10/24/23  2:24 AM  Result Value Ref Range   Creatinine, Ser 0.62 0.44 - 1.00 mg/dL   GFR, Estimated >39 >39 mL/min  CBG monitoring, ED   Collection Time: 10/24/23  8:05 AM  Result Value Ref Range   Glucose-Capillary 97 70 - 99 mg/dL  Respiratory (~79 pathogens) panel by PCR   Collection Time: 10/24/23  8:22 AM   Specimen: Nasopharyngeal Swab; Respiratory  Result Value Ref Range   Adenovirus NOT DETECTED NOT DETECTED   Coronavirus 229E NOT DETECTED NOT DETECTED   Coronavirus HKU1 NOT DETECTED NOT DETECTED   Coronavirus NL63 NOT DETECTED NOT DETECTED   Coronavirus OC43 NOT DETECTED NOT DETECTED   Metapneumovirus NOT DETECTED NOT DETECTED   Rhinovirus / Enterovirus NOT DETECTED NOT DETECTED   Influenza A NOT DETECTED NOT DETECTED    Influenza B NOT DETECTED NOT DETECTED   Parainfluenza Virus 1 NOT DETECTED NOT DETECTED   Parainfluenza Virus 2 NOT DETECTED NOT DETECTED   Parainfluenza Virus 3 NOT DETECTED NOT DETECTED   Parainfluenza Virus 4 NOT DETECTED NOT DETECTED   Respiratory Syncytial Virus NOT DETECTED NOT DETECTED   Bordetella pertussis NOT DETECTED NOT DETECTED   Bordetella Parapertussis NOT DETECTED NOT DETECTED   Chlamydophila pneumoniae NOT DETECTED NOT DETECTED   Mycoplasma pneumoniae NOT DETECTED NOT DETECTED  ECHOCARDIOGRAM COMPLETE   Collection Time: 10/24/23 11:48 AM  Result Value Ref Range   Weight 3,174.62 oz   Height 66 in   BP 129/66 mmHg   Ao pk vel 1.91 m/s   AR max vel 1.41 cm2   AV Peak grad 14.6 mmHg   S' Lateral 3.00 cm   Area-P 1/2 3.43 cm2   Est EF 60 - 65%   Heparin  level (unfractionated)   Collection Time: 10/24/23  2:50 PM  Result Value Ref Range   Heparin  Unfractionated >1.10 (H) 0.30 - 0.70 IU/mL  Protime-INR   Collection Time: 10/24/23  2:50 PM  Result Value Ref Range   Prothrombin Time 15.2 11.4 - 15.2 seconds   INR 1.1 0.8 - 1.2  APTT   Collection Time: 10/24/23  2:50 PM  Result Value Ref Range   aPTT 106 (H) 24 - 36 seconds  Glucose, capillary   Collection Time: 10/24/23  4:27 PM  Result Value Ref Range   Glucose-Capillary 128 (H) 70 - 99 mg/dL  Glucose, capillary   Collection Time: 10/24/23  9:26 PM  Result Value Ref Range   Glucose-Capillary 174 (H) 70 - 99 mg/dL  Basic metabolic panel with GFR   Collection Time: 10/25/23  3:36 AM  Result Value Ref Range   Sodium 137 135 - 145 mmol/L   Potassium 3.2 (L) 3.5 - 5.1 mmol/L   Chloride 104 98 - 111 mmol/L   CO2 24 22 - 32 mmol/L   Glucose, Bld 144 (H) 70 - 99 mg/dL   BUN 12 6 - 20 mg/dL   Creatinine, Ser 9.45 0.44 - 1.00 mg/dL   Calcium  8.2 (L) 8.9 - 10.3 mg/dL   GFR, Estimated >39 >39 mL/min   Anion gap 9 5 - 15  CBC   Collection Time: 10/25/23  3:36 AM  Result Value Ref Range   WBC 8.0 4.0 -  10.5  K/uL   RBC 4.12 3.87 - 5.11 MIL/uL   Hemoglobin 11.7 (L) 12.0 - 15.0 g/dL   HCT 64.6 (L) 63.9 - 53.9 %   MCV 85.7 80.0 - 100.0 fL   MCH 28.4 26.0 - 34.0 pg   MCHC 33.1 30.0 - 36.0 g/dL   RDW 85.3 88.4 - 84.4 %   Platelets 255 150 - 400 K/uL   nRBC 0.0 0.0 - 0.2 %  Magnesium    Collection Time: 10/25/23  3:36 AM  Result Value Ref Range   Magnesium  1.8 1.7 - 2.4 mg/dL  Glucose, capillary   Collection Time: 10/25/23  8:11 AM  Result Value Ref Range   Glucose-Capillary 131 (H) 70 - 99 mg/dL  Glucose, capillary   Collection Time: 10/25/23  1:20 PM  Result Value Ref Range   Glucose-Capillary 149 (H) 70 - 99 mg/dL  Glucose, capillary   Collection Time: 10/25/23  4:21 PM  Result Value Ref Range   Glucose-Capillary 152 (H) 70 - 99 mg/dL  Glucose, capillary   Collection Time: 10/25/23  7:46 PM  Result Value Ref Range   Glucose-Capillary 162 (H) 70 - 99 mg/dL  CBC   Collection Time: 10/26/23  4:54 AM  Result Value Ref Range   WBC 6.9 4.0 - 10.5 K/uL   RBC 4.20 3.87 - 5.11 MIL/uL   Hemoglobin 11.8 (L) 12.0 - 15.0 g/dL   HCT 63.8 63.9 - 53.9 %   MCV 86.0 80.0 - 100.0 fL   MCH 28.1 26.0 - 34.0 pg   MCHC 32.7 30.0 - 36.0 g/dL   RDW 85.4 88.4 - 84.4 %   Platelets 271 150 - 400 K/uL   nRBC 0.0 0.0 - 0.2 %  Comprehensive metabolic panel   Collection Time: 10/26/23  4:54 AM  Result Value Ref Range   Sodium 137 135 - 145 mmol/L   Potassium 3.6 3.5 - 5.1 mmol/L   Chloride 105 98 - 111 mmol/L   CO2 24 22 - 32 mmol/L   Glucose, Bld 131 (H) 70 - 99 mg/dL   BUN 9 6 - 20 mg/dL   Creatinine, Ser 9.59 (L) 0.44 - 1.00 mg/dL   Calcium  8.3 (L) 8.9 - 10.3 mg/dL   Total Protein 6.4 (L) 6.5 - 8.1 g/dL   Albumin 3.2 (L) 3.5 - 5.0 g/dL   AST 12 (L) 15 - 41 U/L   ALT 7 0 - 44 U/L   Alkaline Phosphatase 47 38 - 126 U/L   Total Bilirubin 0.4 0.0 - 1.2 mg/dL   GFR, Estimated >39 >39 mL/min   Anion gap 8 5 - 15  Glucose, capillary   Collection Time: 10/26/23  8:16 AM  Result Value Ref Range    Glucose-Capillary 133 (H) 70 - 99 mg/dL  Glucose, capillary   Collection Time: 10/26/23 12:14 PM  Result Value Ref Range   Glucose-Capillary 161 (H) 70 - 99 mg/dL      Assessment & Plan:   Problem List Items Addressed This Visit     Centrilobular emphysema (HCC)   Chronic pain syndrome   Relevant Medications   DULoxetine  (CYMBALTA ) 30 MG capsule   gabapentin  (NEURONTIN ) 300 MG capsule   Hemiparesis of right dominant side as late effect of cerebrovascular disease (HCC)   History of cerebrovascular accident (CVA) with residual deficit   Hyperlipidemia   Relevant Medications   atorvastatin  (LIPITOR) 40 MG tablet   Moderate episode of recurrent major depressive disorder (HCC) - Primary   Relevant  Medications   DULoxetine  (CYMBALTA ) 30 MG capsule   Seizure disorder (HCC)   Relevant Medications   gabapentin  (NEURONTIN ) 300 MG capsule   Other Visit Diagnoses       Memory loss            Major depressive disorder, recurrent Recurrent depression with worsening mood and memory post-discontinuation of citalopram  and bupropion . Off for 2-3 months now. She discontinued almost all pills medications on her own, but now asking about next treatment options. Interested in other new med option. Duloxetine  considered for mood and pain. - Started duloxetine  30 mg once daily. - Reassess mood and pain in 4-6 weeks for dosage adjustment.  Chronic pain syndrome Chronic pain with decreased efficacy of current medication. Duloxetine  may reduce reliance on current pain medication. - Started duloxetine  30 mg once daily. - Evaluate pain management in 4-6 weeks.  Mixed hyperlipidemia Managed with atorvastatin . - Refilled atorvastatin  prescription.  History of cerebrovascular accident with residual deficit Hemiparesis R dominant side Seizure Disorder She self discontinued Plavix . I advised that she resume Plavix . Memory issues possibly related to past stroke. Neurology follow-up  scheduled. Continues on Keppra  therapy, has not discontinued - Continue neurology follow-up on February 24th.  Centrilobular Emphysema On Trelegy  Peripheral neuropathy Numbness in feet. Gabapentin  dose adjustment considered. She discontinued 600 4 times a day prefers lower dose - Adjusted gabapentin  to 300 mg three times a day, with option to titrate dose as needed.         No orders of the defined types were placed in this encounter.   Meds ordered this encounter  Medications   DULoxetine  (CYMBALTA ) 30 MG capsule    Sig: Take 1 capsule (30 mg total) by mouth daily.    Dispense:  30 capsule    Refill:  2   gabapentin  (NEURONTIN ) 300 MG capsule    Sig: Take 1 capsule (300 mg total) by mouth 3 (three) times daily.    Dispense:  90 capsule    Refill:  2   atorvastatin  (LIPITOR) 40 MG tablet    Sig: Take 1 tablet (40 mg total) by mouth at bedtime.    Dispense:  90 tablet    Refill:  1    Add refills    Follow up plan: Return in about 4 weeks (around 02/24/2024), or if symptoms worsen or fail to improve.   Patient verbalizes understanding with the above medical recommendations including the limitation of remote medical advice.  Specific follow-up and call-back criteria were given for patient to follow-up or seek medical care more urgently if needed.  Total duration of direct patient care provided via video conference: 10 minutes   Marsa Officer, DO [facility] 01/27/2024, 4:03 PM     [1]  Social History Tobacco Use   Smoking status: Every Day    Current packs/day: 2.00    Average packs/day: 2.0 packs/day for 47.0 years (94.1 ttl pk-yrs)    Types: Cigarettes    Start date: 1979   Smokeless tobacco: Never  Vaping Use   Vaping status: Never Used  Substance Use Topics   Alcohol  use: No   Drug use: Yes    Types: Marijuana, Oxycodone     Comment: last smoked 2 days ago  8/4   "

## 2024-01-27 NOTE — Telephone Encounter (Signed)
 Copied from CRM [id number]. Topic: General - Other >> Jan 27, 2024  3:29 PM Joesph B wrote: Reason for CRM: patient would like to know if she's connected on her video call. Please fu.

## 2024-01-27 NOTE — Patient Instructions (Addendum)
 Thank you for coming to the office today.    Please schedule a Follow-up Appointment to: Return in about 4 weeks (around 02/24/2024), or if symptoms worsen or fail to improve.  If you have any other questions or concerns, please feel free to call the office or send a message through MyChart. You may also schedule an earlier appointment if necessary.  Additionally, you may be receiving a survey about your experience at our office within a few days to 1 week by e-mail or mail. We value your feedback.  Marsa Officer, DO [facility], [place]

## 2024-01-27 NOTE — Telephone Encounter (Signed)
 Spoke with patient notified that we are running behind in the office and Dr Edman will be with her soon.

## 2024-01-29 ENCOUNTER — Telehealth: Payer: Self-pay | Admitting: Family Medicine

## 2024-01-29 NOTE — Telephone Encounter (Signed)
 No further action needed at this time.

## 2024-01-29 NOTE — Telephone Encounter (Signed)
 Copied from CRM [id number]. Topic: Clinical - Medication Question >> Jan 29, 2024  3:09 PM Mia F wrote: Reason for CRM: Pt called to check the status of her medications. She was unaware of where they were sent. They were sent to [facility] [place], [place] - [address] [place] [address] Phone: [phone] Fax: [phone]

## 2024-02-01 ENCOUNTER — Other Ambulatory Visit (INDEPENDENT_AMBULATORY_CARE_PROVIDER_SITE_OTHER): Payer: Self-pay | Admitting: Nurse Practitioner

## 2024-02-01 ENCOUNTER — Ambulatory Visit

## 2024-02-01 DIAGNOSIS — I739 Peripheral vascular disease, unspecified: Secondary | ICD-10-CM

## 2024-02-02 ENCOUNTER — Other Ambulatory Visit (INDEPENDENT_AMBULATORY_CARE_PROVIDER_SITE_OTHER)

## 2024-02-02 ENCOUNTER — Ambulatory Visit (INDEPENDENT_AMBULATORY_CARE_PROVIDER_SITE_OTHER): Admitting: Nurse Practitioner

## 2024-02-02 ENCOUNTER — Encounter (INDEPENDENT_AMBULATORY_CARE_PROVIDER_SITE_OTHER): Payer: Self-pay | Admitting: Nurse Practitioner

## 2024-02-02 VITALS — BP 110/75 | HR 92 | Resp 16 | Ht 66.0 in

## 2024-02-02 DIAGNOSIS — Z9889 Other specified postprocedural states: Secondary | ICD-10-CM | POA: Diagnosis not present

## 2024-02-02 DIAGNOSIS — E1151 Type 2 diabetes mellitus with diabetic peripheral angiopathy without gangrene: Secondary | ICD-10-CM | POA: Diagnosis not present

## 2024-02-02 DIAGNOSIS — I70223 Atherosclerosis of native arteries of extremities with rest pain, bilateral legs: Secondary | ICD-10-CM

## 2024-02-02 DIAGNOSIS — I10 Essential (primary) hypertension: Secondary | ICD-10-CM

## 2024-02-02 DIAGNOSIS — I739 Peripheral vascular disease, unspecified: Secondary | ICD-10-CM | POA: Diagnosis not present

## 2024-02-06 ENCOUNTER — Encounter (INDEPENDENT_AMBULATORY_CARE_PROVIDER_SITE_OTHER): Payer: Self-pay | Admitting: Nurse Practitioner

## 2024-02-06 NOTE — Progress Notes (Incomplete)
 "  Subjective:    Patient ID: [name], female    DOB: 04-18-1965, 59 y.o.   MRN: [id number] Chief Complaint  Patient presents with   Follow-up    abi and ble arterial     HPI  Discussed the use of AI scribe software for clinical note transcription with the patient, who gave verbal consent to proceed.  History of Present Illness [name] is a 59 year old female with peripheral artery disease, prior left lower extremity stenting, and left great toe amputation who presents with persistent left leg pain and inability to ambulate.  She reports severe, persistent pain localized to the left upper leg and groin, with extension slightly down the leg. The pain is exacerbated at night, particularly while in bed, and involves the feet. She is unable to ambulate due to the severity of the pain. She also describes burning and stinging sensations in the feet, which she attributes to peripheral neuropathy.  She has undergone multiple vascular interventions for peripheral artery disease, including stent placement in the left superficial femoral artery and popliteal artery, with the most recent intervention on April 01, 2023. She also has a history of left great toe amputation. Recent vascular studies include an ABI of 0.77 on the left and a duplex ultrasound from August 27, 2022. The patient recalls difficulty during the study due to sensitivity in the area. She notes increased sensitivity and soreness in the groin, particularly during a recent vascular study when the probe was applied. She denies recent new stent placement and remains concerned about persistent focal pain in the groin and upper leg.  She also endorses significant back pain, which has been worsening recently.    Results Diagnostic Ankle Brachial Index, left: 0.77, reduced Lower extremity arterial duplex ultrasound, left: 50-74% stenosis at area of pain; flow changes from triphasic to dampened and monophasic distal to stent;  possible stent restenosis not fully visualized due to patient sensitivity   Review of Systems     Objective:   Physical Exam  Physical Exam EXTREMITIES: Groin area sore on palpation  BP 110/75   Pulse 92   Resp 16   Ht 5' 6 (1.676 m)   BMI 32.28 kg/m   Past Medical History:  Diagnosis Date   Anxiety    Cancer (HCC)    a spot on liver and treated    Complication of anesthesia    restless,easily upset   COPD (chronic obstructive pulmonary disease) (HCC)    Depression    Diabetes mellitus without complication (HCC)    Diverticulitis    Fatty liver    GERD (gastroesophageal reflux disease)    Headache(784.0)    migraines   History of kidney stones    Hyperlipidemia    Hypertension    PAD (peripheral artery disease)    Pneumonia    Restless    Stroke [facility])     Social History   Socioeconomic History   Marital status: Married    Spouse name: Not on file   Number of children: Not on file   Years of education: Not on file   Highest education level: 9th grade  Occupational History   Not on file  Tobacco Use   Smoking status: Every Day    Current packs/day: 2.00    Average packs/day: 2.0 packs/day for 47.1 years (94.1 ttl pk-yrs)    Types: Cigarettes    Start date: 1979   Smokeless tobacco: Never  Vaping Use   Vaping  status: Never Used  Substance and Sexual Activity   Alcohol  use: No   Drug use: Yes    Types: Marijuana, Oxycodone     Comment: last smoked 2 days ago  8/4   Sexual activity: Not on file  Other Topics Concern   Not on file  Social History Narrative   Not on file   Social Drivers of Health   Tobacco Use: High Risk (02/06/2024)   Patient History    Smoking Tobacco Use: Every Day    Smokeless Tobacco Use: Never    Passive Exposure: Not on file  Financial Resource Strain: Medium Risk (12/22/2023)   Overall Financial Resource Strain (CARDIA)    Difficulty of Paying Living Expenses: Somewhat hard  Food  Insecurity: Food Insecurity Present (12/22/2023)   Epic    Worried About Programme Researcher, Broadcasting/film/video in the Last Year: Never true    Ran Out of Food in the Last Year: Sometimes true  Transportation Needs: Unmet Transportation Needs (12/22/2023)   Epic    Lack of Transportation (Medical): Yes    Lack of Transportation (Non-Medical): Yes  Physical Activity: Inactive (05/22/2023)   Exercise Vital Sign    Days of Exercise per Week: 0 days    Minutes of Exercise per Session: 0 min  Stress: No Stress Concern Present (05/22/2023)   [employer] of Occupational Health - Occupational Stress Questionnaire    Feeling of Stress : Only a little  Social Connections: Moderately Isolated (12/22/2023)   Social Connection and Isolation Panel    Frequency of Communication with Friends and Family: More than three times a week    Frequency of Social Gatherings with Friends and Family: Never    Attends Religious Services: Never    Database Administrator or Organizations: No    Attends Engineer, Structural: Not on file    Marital Status: Married  Catering Manager Violence: Not At Risk (10/27/2023)   Epic    Fear of Current or Ex-Partner: No    Emotionally Abused: No    Physically Abused: No    Sexually Abused: No  Depression (PHQ2-9): High Risk (01/27/2024)   Depression (PHQ2-9)    PHQ-2 Score: 11  Alcohol  Screen: Low Risk (05/22/2023)   Alcohol  Screen    Last Alcohol  Screening Score (AUDIT): 0  Housing: Unknown (12/22/2023)   Epic    Unable to Pay for Housing in the Last Year: No    Number of Times Moved in the Last Year: Not on file    Homeless in the Last Year: No  Utilities: Not At Risk (10/27/2023)   Epic    Threatened with loss of utilities: No  Health Literacy: Adequate Health Literacy (05/22/2023)   B1300 Health Literacy    Frequency of need for help with medical instructions: Never    Past Surgical History:  Procedure Laterality Date   ABDOMINAL HYSTERECTOMY      ACHILLES TENDON LENGTHENING Right 08/18/2017   Procedure: ACHILLES TENDON LENGTHENING;  Surgeon: Celena Sharper, MD;  Location: [facility] OR;  Service: Orthopedics;  Laterality: Right;   AMPUTATION TOE Left 04/02/2023   Procedure: AMPUTATION, TOE;  Surgeon: Malvin Marsa FALCON, DPM;  Location: [facility] ORS;  Service: Orthopedics/Podiatry;  Laterality: Left;  LEFT GREAT TOE   ANTERIOR CERVICAL DECOMP/DISCECTOMY FUSION N/A 03/11/2012   Procedure: ANTERIOR CERVICAL DECOMPRESSION/DISCECTOMY FUSION 1 LEVEL;  Surgeon: Reyes JONETTA Budge, MD;  Location: [facility];  Service: Neurosurgery;  Laterality: N/A;  Cervical five-six anterior cervical decompression with fusion interbody prothesis  plating and bonegraft   BACK SURGERY     ENDARTERECTOMY FEMORAL Left 04/01/2023   Procedure: ENDARTERECTOMY, FEMORAL;  Surgeon: Jama Cordella MATSU, MD;  Location: [facility] ORS;  Service: Vascular;  Laterality: Left;  with Popiteal stent placment   EXTERNAL FIXATION LEG Right 08/10/2017   Procedure: EXTERNAL FIXATION ANKLE;  Surgeon: Mardee Lynwood SQUIBB, MD;  Location: [facility] ORS;  Service: Orthopedics;  Laterality: Right;   EXTERNAL FIXATION REMOVAL Right 08/18/2017   Procedure: REMOVAL EXTERNAL FIXATION LEG;  Surgeon: Celena Sharper, MD;  Location: [facility] OR;  Service: Orthopedics;  Laterality: Right;   EYE SURGERY     HERNIA REPAIR     IR FL GUIDED LOC OF NEEDLE/CATH TIP FOR SPINAL INJECTION LT  12/09/2021   IR FL GUIDED LOC OF NEEDLE/CATH TIP FOR SPINAL INJECTION RT  12/09/2021   JOINT REPLACEMENT     bil knees   LOWER EXTREMITY ANGIOGRAPHY Left 03/27/2023   Procedure: Lower Extremity Angiography;  Surgeon: Jama Cordella MATSU, MD;  Location: [facility] INVASIVE CV LAB;  Service: Cardiovascular;  Laterality: Left;   ORIF ANKLE FRACTURE Right 08/18/2017   Procedure: OPEN REDUCTION INTERNAL FIXATION (ORIF) ANKLE FRACTURE;  Surgeon: Celena Sharper, MD;  Location: [facility] OR;  Service: Orthopedics;  Laterality: Right;   REPLACEMENT TOTAL KNEE  BILATERAL      Family History  Problem Relation Age of Onset   Diabetes Mother    Hypertension Mother    Hypertension Father     Allergies[1]     Latest Ref Rng & Units 10/26/2023    4:54 AM 10/25/2023    3:36 AM 10/24/2023    2:24 AM  CBC  WBC 4.0 - 10.5 K/uL 6.9  8.0  9.8   Hemoglobin 12.0 - 15.0 g/dL 88.1  88.2  88.2   Hematocrit 36.0 - 46.0 % 36.1  35.3  35.8   Platelets 150 - 400 K/uL 271  255  286       CMP     Component Value Date/Time   NA 137 10/26/2023 0454   NA 135 (L) 09/22/2013 2251   K 3.6 10/26/2023 0454   K 3.8 09/22/2013 2251   CL 105 10/26/2023 0454   CL 100 09/22/2013 2251   CO2 24 10/26/2023 0454   CO2 27 09/22/2013 2251   GLUCOSE 131 (H) 10/26/2023 0454   GLUCOSE 335 (H) 09/22/2013 2251   BUN 9 10/26/2023 0454   BUN 11 09/22/2013 2251   CREATININE 0.40 (L) 10/26/2023 0454   CREATININE 0.61 07/03/2023 1347   CALCIUM  8.3 (L) 10/26/2023 0454   CALCIUM  8.3 (L) 09/22/2013 2251   PROT 6.4 (L) 10/26/2023 0454   PROT 7.2 09/22/2013 2251   ALBUMIN 3.2 (L) 10/26/2023 0454   ALBUMIN 2.6 (L) 09/22/2013 2251   AST 12 (L) 10/26/2023 0454   AST 17 09/22/2013 2251   ALT 7 10/26/2023 0454   ALT 16 09/22/2013 2251   ALKPHOS 47 10/26/2023 0454   ALKPHOS 78 09/22/2013 2251   BILITOT 0.4 10/26/2023 0454   BILITOT 0.2 09/22/2013 2251   GFR 91.12 09/23/2017 1627   EGFR 103 07/03/2023 1347   GFRNONAA >60 10/26/2023 0454   GFRNONAA >60 09/22/2013 2251     VAS US  ABI WITH/WO TBI Result Date: 02/04/2024  LOWER EXTREMITY DOPPLER STUDY Patient Name:  CLEOTHA WHALIN  Date of Exam:   02/02/2024 Medical Rec #: [id number]       Accession #:    [id number] Date of Birth: Aug 25, 1965  Patient Gender: F Patient Age:   49 years Exam Location:  [facility] Procedure:      VAS US  ABI WITH/WO TBI Referring Phys: ORVIN DARING --------------------------------------------------------------------------------  Indications: Peripheral artery disease. High Risk  Factors: Hyperlipidemia, Diabetes, current smoker, prior CVA.  Vascular Interventions: 04/01/2023 Left SFA and popliteal artery stents. Limitations: Today's exam was limited due to patient in wheelchair and              involuntary patient movement. Performing Technologist: Donnice Charnley RVT  Examination Guidelines: A complete evaluation includes at minimum, Doppler waveform signals and systolic blood pressure reading at the level of bilateral brachial, anterior tibial, and posterior tibial arteries, when vessel segments are accessible. Bilateral testing is considered an integral part of a complete examination. Photoelectric Plethysmograph (PPG) waveforms and toe systolic pressure readings are included as required and additional duplex testing as needed. Limited examinations for reoccurring indications may be performed as noted.  ABI Findings: +---------+------------------+-----+--------+--------+ Right    Rt Pressure (mmHg)IndexWaveformComment  +---------+------------------+-----+--------+--------+ Brachial 120                                     +---------+------------------+-----+--------+--------+ PTA      112               0.93 biphasic         +---------+------------------+-----+--------+--------+ DP       123               1.02 biphasic         +---------+------------------+-----+--------+--------+ Great Toe97                0.81                  +---------+------------------+-----+--------+--------+ +---------+------------------+-----+--------+---------+ Left     Lt Pressure (mmHg)IndexWaveformComment   +---------+------------------+-----+--------+---------+ Brachial 118                                      +---------+------------------+-----+--------+---------+ PTA      92                0.77                   +---------+------------------+-----+--------+---------+ PERO                                    No signal  +---------+------------------+-----+--------+---------+ DP       0                 0.00         No signal +---------+------------------+-----+--------+---------+ Great Toe                               Amputated +---------+------------------+-----+--------+---------+  Left great toe amputation. Unable to obtain any other toe ppg signal secondary to tremor.  Summary: Right: Resting right ankle-brachial index is within normal range. The right toe-brachial index is normal.  Left: Resting left ankle-brachial index indicates moderate left lower extremity arterial disease.  *See table(s) above for measurements and observations.  Electronically signed by Selinda Gu MD on 02/04/2024 at 9:15:26 AM.    Final        Assessment & Plan:  1. Atherosclerosis of native artery of both lower extremities with rest pain (HCC) (Primary) Peripheral artery disease, left lower extremity Chronic PAD with prior stenting and toe amputation. Persistent focal pain in left upper leg and groin, possibly multifactorial. ABI reduced to 0.77, imaging shows 50-74% stenosis at prior intervention site. Concern for progression of PAD, neuropathy, and lumbar pathology considered. Further evaluation and possible intervention indicated. - Ordered left lower extremity angiogram to assess stent patency and further stenosis. - Coordinated outpatient surgical scheduling for angiogram. - Planned endovascular intervention (stenting) if significant stenosis is confirmed during angiogram. - Arranged follow-up visit a few weeks post-procedure to assess outcome and further management.  2. Type 2 diabetes mellitus with peripheral vascular disease (HCC) ***  3. Primary hypertension ***   Assessment and Plan Assessment & Plan      Medications Ordered Prior to Encounter[2]  There are no Patient Instructions on file for this visit. Return for LLE angio with Leita .   Eowyn Tabone E Wakeelah Solan, NP        [1] Allergies Allergen  Reactions   Tramadol Nausea And Vomiting and Hypertension   Augmentin [Amoxicillin-Pot Clavulanate] Rash    Tolerated Zosyn  03/26/23  [2] Current Outpatient Medications on File Prior to Visit  Medication Sig Dispense Refill   albuterol  (VENTOLIN  HFA) 108 (90 Base) MCG/ACT inhaler INHALE 2 INHALATIONS BY MOUTH  EVERY 4 HOURS AS NEEDED FOR  WHEEZING OR FOR SHORTNESS OF  BREATH 51 g 1   atorvastatin  (LIPITOR) 40 MG tablet Take 1 tablet (40 mg total) by mouth at bedtime. 90 tablet 1   bisacodyl  5 MG EC tablet Take 2 tablets (10 mg total) by mouth daily as needed for moderate constipation. 30 tablet 0   clopidogrel  (PLAVIX ) 75 MG tablet TAKE 1 TABLET BY MOUTH DAILY AT  9AM 100 tablet 2   DULoxetine  (CYMBALTA ) 30 MG capsule Take 1 capsule (30 mg total) by mouth daily. 30 capsule 2   fluticasone  (FLONASE ) 50 MCG/ACT nasal spray Place 2 sprays into both nostrils daily. Use for 4-6 weeks then stop and use seasonally or as needed. 16 g 0   gabapentin  (NEURONTIN ) 300 MG capsule Take 1 capsule (300 mg total) by mouth 3 (three) times daily. 90 capsule 2   levETIRAcetam  (KEPPRA ) 500 MG tablet Take 1 tablet (500 mg total) by mouth 2 (two) times daily. 180 tablet 1   MOUNJARO  10 MG/0.5ML Pen INJECT THE CONTENTS OF ONE PEN  SUBCUTANEOUSLY WEEKLY AS  DIRECTED 6 mL 3   ondansetron  (ZOFRAN -ODT) 4 MG disintegrating tablet DISSOLVE 1 TABLET ON THE TONGUE EVERY 8 HOURS AS NEEDED FOR NAUSEA AND VOMITING 30 tablet 2   oxyCODONE -acetaminophen  (PERCOCET) 10-325 MG tablet Take 1 tablet by mouth every 6 (six) hours as needed for pain.     pantoprazole  (PROTONIX ) 40 MG tablet TAKE 1 TABLET BY MOUTH DAILY AT  9 AM 100 tablet 1   rizatriptan  (MAXALT -MLT) 10 MG disintegrating tablet Take 1 tablet (10 mg total) by mouth as needed. May repeat in 2 hours if needed 10 tablet 2   TRELEGY ELLIPTA  100-62.5-25 MCG/ACT AEPB USE 1 INHALATION BY MOUTH ONCE  DAILY AT THE SAME TIME EACH DAY 180 each 3   leptospermum manuka  honey (MEDIHONEY) gel Apply 1 Application topically daily. (Patient not taking: Reported on 01/13/2024) 44 mL 0   naloxone  (NARCAN ) nasal spray 4 mg/0.1 mL Place 1 spray into the nose once. (Patient not taking: Reported on 01/13/2024)     nicotine  (NICODERM CQ  -  DOSED IN MG/24 HOURS) 14 mg/24hr patch Place onto the skin. (Patient not taking: Reported on 02/02/2024)     nystatin  (MYCOSTATIN /NYSTOP ) powder Apply 1 Application topically 3 (three) times daily. (Patient not taking: Reported on 01/13/2024) 15 g 0   No current facility-administered medications on file prior to visit.  "

## 2024-02-06 NOTE — Progress Notes (Signed)
 "  Subjective:    Patient ID: [name], female    DOB: May 09, 1965, 59 y.o.   MRN: [id number] Chief Complaint  Patient presents with   Follow-up    abi and ble arterial     HPI  Discussed the use of AI scribe software for clinical note transcription with the patient, who gave verbal consent to proceed.  History of Present Illness [name] is a 59 year old female with peripheral artery disease, prior left lower extremity stenting, and left great toe amputation who presents with persistent left leg pain and inability to ambulate.  She reports severe, persistent pain localized to the left upper leg and groin, with extension slightly down the leg. The pain is exacerbated at night, particularly while in bed, and involves the feet. She is unable to ambulate due to the severity of the pain. She also describes burning and stinging sensations in the feet, which she attributes to peripheral neuropathy.  She has undergone multiple vascular interventions for peripheral artery disease, including stent placement in the left superficial femoral artery and popliteal artery, with the most recent intervention on April 01, 2023. She also has a history of left great toe amputation. Recent vascular studies include an ABI of 0.77 on the left and a duplex ultrasound from August 27, 2022. The patient recalls difficulty during the study due to sensitivity in the area. She notes increased sensitivity and soreness in the groin, particularly during a recent vascular study when the probe was applied. She denies recent new stent placement and remains concerned about persistent focal pain in the groin and upper leg.  She also endorses significant back pain, which has been worsening recently.    Results Diagnostic Ankle Brachial Index, left: 0.77, reduced Lower extremity arterial duplex ultrasound, left: 50-74% stenosis at area of pain; flow changes from triphasic to dampened and monophasic distal to stent;  possible stent restenosis not fully visualized due to patient sensitivity   Review of Systems  Musculoskeletal:  Positive for back pain and gait problem.  Neurological:  Positive for weakness.  All other systems reviewed and are negative.      Objective:   Physical Exam Vitals reviewed.  HENT:     Head: Normocephalic.  Cardiovascular:     Rate and Rhythm: Normal rate.  Pulmonary:     Effort: Pulmonary effort is normal.  Skin:    General: Skin is warm and dry.  Neurological:     Mental Status: She is alert and oriented to person, place, and time.     Motor: Weakness present.     Gait: Gait abnormal.  Psychiatric:        Mood and Affect: Mood normal.        Behavior: Behavior normal.        Thought Content: Thought content normal.        Judgment: Judgment normal.     Physical Exam EXTREMITIES: Groin area sore on palpation  BP 110/75   Pulse 92   Resp 16   Ht 5' 6 (1.676 m)   BMI 32.28 kg/m   Past Medical History:  Diagnosis Date   Anxiety    Cancer (HCC)    a spot on liver and treated    Complication of anesthesia    restless,easily upset   COPD (chronic obstructive pulmonary disease) (HCC)    Depression    Diabetes mellitus without complication (HCC)    Diverticulitis    Fatty liver    GERD (  gastroesophageal reflux disease)    Headache(784.0)    migraines   History of kidney stones    Hyperlipidemia    Hypertension    PAD (peripheral artery disease)    Pneumonia    Restless    Stroke [facility])     Social History   Socioeconomic History   Marital status: Married    Spouse name: Not on file   Number of children: Not on file   Years of education: Not on file   Highest education level: 9th grade  Occupational History   Not on file  Tobacco Use   Smoking status: Every Day    Current packs/day: 2.00    Average packs/day: 2.0 packs/day for 47.1 years (94.1 ttl pk-yrs)    Types: Cigarettes    Start date: 1979   Smokeless tobacco: Never  Vaping  Use   Vaping status: Never Used  Substance and Sexual Activity   Alcohol  use: No   Drug use: Yes    Types: Marijuana, Oxycodone     Comment: last smoked 2 days ago  8/4   Sexual activity: Not on file  Other Topics Concern   Not on file  Social History Narrative   Not on file   Social Drivers of Health   Tobacco Use: High Risk (02/06/2024)   Patient History    Smoking Tobacco Use: Every Day    Smokeless Tobacco Use: Never    Passive Exposure: Not on file  Financial Resource Strain: Medium Risk (12/22/2023)   Overall Financial Resource Strain (CARDIA)    Difficulty of Paying Living Expenses: Somewhat hard  Food Insecurity: Food Insecurity Present (12/22/2023)   Epic    Worried About Programme Researcher, Broadcasting/film/video in the Last Year: Never true    Ran Out of Food in the Last Year: Sometimes true  Transportation Needs: Unmet Transportation Needs (12/22/2023)   Epic    Lack of Transportation (Medical): Yes    Lack of Transportation (Non-Medical): Yes  Physical Activity: Inactive (05/22/2023)   Exercise Vital Sign    Days of Exercise per Week: 0 days    Minutes of Exercise per Session: 0 min  Stress: No Stress Concern Present (05/22/2023)   [employer] of Occupational Health - Occupational Stress Questionnaire    Feeling of Stress : Only a little  Social Connections: Moderately Isolated (12/22/2023)   Social Connection and Isolation Panel    Frequency of Communication with Friends and Family: More than three times a week    Frequency of Social Gatherings with Friends and Family: Never    Attends Religious Services: Never    Database Administrator or Organizations: No    Attends Engineer, Structural: Not on file    Marital Status: Married  Catering Manager Violence: Not At Risk (10/27/2023)   Epic    Fear of Current or Ex-Partner: No    Emotionally Abused: No    Physically Abused: No    Sexually Abused: No  Depression (PHQ2-9): High Risk (01/27/2024)   Depression (PHQ2-9)     PHQ-2 Score: 11  Alcohol  Screen: Low Risk (05/22/2023)   Alcohol  Screen    Last Alcohol  Screening Score (AUDIT): 0  Housing: Unknown (12/22/2023)   Epic    Unable to Pay for Housing in the Last Year: No    Number of Times Moved in the Last Year: Not on file    Homeless in the Last Year: No  Utilities: Not At Risk (10/27/2023)   Epic  Threatened with loss of utilities: No  Health Literacy: Adequate Health Literacy (05/22/2023)   B1300 Health Literacy    Frequency of need for help with medical instructions: Never    Past Surgical History:  Procedure Laterality Date   ABDOMINAL HYSTERECTOMY     ACHILLES TENDON LENGTHENING Right 08/18/2017   Procedure: ACHILLES TENDON LENGTHENING;  Surgeon: Celena Sharper, MD;  Location: [facility] OR;  Service: Orthopedics;  Laterality: Right;   AMPUTATION TOE Left 04/02/2023   Procedure: AMPUTATION, TOE;  Surgeon: Malvin Marsa FALCON, DPM;  Location: [facility] ORS;  Service: Orthopedics/Podiatry;  Laterality: Left;  LEFT GREAT TOE   ANTERIOR CERVICAL DECOMP/DISCECTOMY FUSION N/A 03/11/2012   Procedure: ANTERIOR CERVICAL DECOMPRESSION/DISCECTOMY FUSION 1 LEVEL;  Surgeon: Reyes JONETTA Budge, MD;  Location: [facility];  Service: Neurosurgery;  Laterality: N/A;  Cervical five-six anterior cervical decompression with fusion interbody prothesis plating and bonegraft   BACK SURGERY     ENDARTERECTOMY FEMORAL Left 04/01/2023   Procedure: ENDARTERECTOMY, FEMORAL;  Surgeon: Jama Cordella MATSU, MD;  Location: [facility] ORS;  Service: Vascular;  Laterality: Left;  with Popiteal stent placment   EXTERNAL FIXATION LEG Right 08/10/2017   Procedure: EXTERNAL FIXATION ANKLE;  Surgeon: Mardee Lynwood SQUIBB, MD;  Location: [facility] ORS;  Service: Orthopedics;  Laterality: Right;   EXTERNAL FIXATION REMOVAL Right 08/18/2017   Procedure: REMOVAL EXTERNAL FIXATION LEG;  Surgeon: Celena Sharper, MD;  Location: [facility] OR;  Service: Orthopedics;  Laterality: Right;   EYE SURGERY     HERNIA REPAIR     IR FL  GUIDED LOC OF NEEDLE/CATH TIP FOR SPINAL INJECTION LT  12/09/2021   IR FL GUIDED LOC OF NEEDLE/CATH TIP FOR SPINAL INJECTION RT  12/09/2021   JOINT REPLACEMENT     bil knees   LOWER EXTREMITY ANGIOGRAPHY Left 03/27/2023   Procedure: Lower Extremity Angiography;  Surgeon: Jama Cordella MATSU, MD;  Location: [facility] INVASIVE CV LAB;  Service: Cardiovascular;  Laterality: Left;   ORIF ANKLE FRACTURE Right 08/18/2017   Procedure: OPEN REDUCTION INTERNAL FIXATION (ORIF) ANKLE FRACTURE;  Surgeon: Celena Sharper, MD;  Location: [facility] OR;  Service: Orthopedics;  Laterality: Right;   REPLACEMENT TOTAL KNEE BILATERAL      Family History  Problem Relation Age of Onset   Diabetes Mother    Hypertension Mother    Hypertension Father     Allergies[1]     Latest Ref Rng & Units 10/26/2023    4:54 AM 10/25/2023    3:36 AM 10/24/2023    2:24 AM  CBC  WBC 4.0 - 10.5 K/uL 6.9  8.0  9.8   Hemoglobin 12.0 - 15.0 g/dL 88.1  88.2  88.2   Hematocrit 36.0 - 46.0 % 36.1  35.3  35.8   Platelets 150 - 400 K/uL 271  255  286       CMP     Component Value Date/Time   NA 137 10/26/2023 0454   NA 135 (L) 09/22/2013 2251   K 3.6 10/26/2023 0454   K 3.8 09/22/2013 2251   CL 105 10/26/2023 0454   CL 100 09/22/2013 2251   CO2 24 10/26/2023 0454   CO2 27 09/22/2013 2251   GLUCOSE 131 (H) 10/26/2023 0454   GLUCOSE 335 (H) 09/22/2013 2251   BUN 9 10/26/2023 0454   BUN 11 09/22/2013 2251   CREATININE 0.40 (L) 10/26/2023 0454   CREATININE 0.61 07/03/2023 1347   CALCIUM  8.3 (L) 10/26/2023 0454   CALCIUM  8.3 (L) 09/22/2013 2251   PROT 6.4 (L) 10/26/2023  0454   PROT 7.2 09/22/2013 2251   ALBUMIN 3.2 (L) 10/26/2023 0454   ALBUMIN 2.6 (L) 09/22/2013 2251   AST 12 (L) 10/26/2023 0454   AST 17 09/22/2013 2251   ALT 7 10/26/2023 0454   ALT 16 09/22/2013 2251   ALKPHOS 47 10/26/2023 0454   ALKPHOS 78 09/22/2013 2251   BILITOT 0.4 10/26/2023 0454   BILITOT 0.2 09/22/2013 2251   GFR 91.12 09/23/2017 1627   EGFR  103 07/03/2023 1347   GFRNONAA >60 10/26/2023 0454   GFRNONAA >60 09/22/2013 2251     VAS US  ABI WITH/WO TBI Result Date: 02/04/2024  LOWER EXTREMITY DOPPLER STUDY Patient Name:  [name]  Date of Exam:   02/02/2024 Medical Rec #: [id number]       Accession #:    [id number] Date of Birth: 1965-05-25        Patient Gender: F Patient Age:   19 years Exam Location:  [facility] Procedure:      VAS US  ABI WITH/WO TBI Referring Phys: ORVIN DARING --------------------------------------------------------------------------------  Indications: Peripheral artery disease. High Risk Factors: Hyperlipidemia, Diabetes, current smoker, prior CVA.  Vascular Interventions: 04/01/2023 Left SFA and popliteal artery stents. Limitations: Today's exam was limited due to patient in wheelchair and              involuntary patient movement. Performing Technologist: Donnice Charnley RVT  Examination Guidelines: A complete evaluation includes at minimum, Doppler waveform signals and systolic blood pressure reading at the level of bilateral brachial, anterior tibial, and posterior tibial arteries, when vessel segments are accessible. Bilateral testing is considered an integral part of a complete examination. Photoelectric Plethysmograph (PPG) waveforms and toe systolic pressure readings are included as required and additional duplex testing as needed. Limited examinations for reoccurring indications may be performed as noted.  ABI Findings: +---------+------------------+-----+--------+--------+ Right    Rt Pressure (mmHg)IndexWaveformComment  +---------+------------------+-----+--------+--------+ Brachial 120                                     +---------+------------------+-----+--------+--------+ PTA      112               0.93 biphasic         +---------+------------------+-----+--------+--------+ DP       123               1.02 biphasic          +---------+------------------+-----+--------+--------+ Great Toe97                0.81                  +---------+------------------+-----+--------+--------+ +---------+------------------+-----+--------+---------+ Left     Lt Pressure (mmHg)IndexWaveformComment   +---------+------------------+-----+--------+---------+ Brachial 118                                      +---------+------------------+-----+--------+---------+ PTA      92                0.77                   +---------+------------------+-----+--------+---------+ PERO                                    No signal +---------+------------------+-----+--------+---------+  DP       0                 0.00         No signal +---------+------------------+-----+--------+---------+ Great Toe                               Amputated +---------+------------------+-----+--------+---------+  Left great toe amputation. Unable to obtain any other toe ppg signal secondary to tremor.  Summary: Right: Resting right ankle-brachial index is within normal range. The right toe-brachial index is normal.  Left: Resting left ankle-brachial index indicates moderate left lower extremity arterial disease.  *See table(s) above for measurements and observations.  Electronically signed by Selinda Gu MD on 02/04/2024 at 9:15:26 AM.    Final        Assessment & Plan:   1. Atherosclerosis of native artery of both lower extremities with rest pain (HCC) (Primary) Peripheral artery disease, left lower extremity Chronic PAD with prior stenting and toe amputation. Persistent focal pain in left upper leg and groin, possibly multifactorial. ABI reduced to 0.77, imaging shows 50-74% stenosis at prior intervention site. Concern for progression of PAD, neuropathy, and lumbar pathology considered. Further evaluation and possible intervention indicated. - Ordered left lower extremity angiogram to assess stent patency and further stenosis. -  Coordinated outpatient surgical scheduling for angiogram. - Planned endovascular intervention (stenting) if significant stenosis is confirmed during angiogram. - Arranged follow-up visit a few weeks post-procedure to assess outcome and further management.  2. Type 2 diabetes mellitus with peripheral vascular disease (HCC) Continue hypoglycemic medications as already ordered, these medications have been reviewed and there are no changes at this time.  Hgb A1C to be monitored as already arranged by primary service  3. Primary hypertension Continue antihypertensive medications as already ordered, these medications have been reviewed and there are no changes at this time.   Assessment and Plan Assessment & Plan      Medications Ordered Prior to Encounter[2]  There are no Patient Instructions on file for this visit. Return for LLE angio with Leita .   Reann Dobias E Burnell Hurta, NP      [1]  Allergies Allergen Reactions   Tramadol Nausea And Vomiting and Hypertension   Augmentin [Amoxicillin-Pot Clavulanate] Rash    Tolerated Zosyn  03/26/23  [2]  Current Outpatient Medications on File Prior to Visit  Medication Sig Dispense Refill   albuterol  (VENTOLIN  HFA) 108 (90 Base) MCG/ACT inhaler INHALE 2 INHALATIONS BY MOUTH  EVERY 4 HOURS AS NEEDED FOR  WHEEZING OR FOR SHORTNESS OF  BREATH 51 g 1   atorvastatin  (LIPITOR) 40 MG tablet Take 1 tablet (40 mg total) by mouth at bedtime. 90 tablet 1   bisacodyl  5 MG EC tablet Take 2 tablets (10 mg total) by mouth daily as needed for moderate constipation. 30 tablet 0   clopidogrel  (PLAVIX ) 75 MG tablet TAKE 1 TABLET BY MOUTH DAILY AT  9AM 100 tablet 2   DULoxetine  (CYMBALTA ) 30 MG capsule Take 1 capsule (30 mg total) by mouth daily. 30 capsule 2   fluticasone  (FLONASE ) 50 MCG/ACT nasal spray Place 2 sprays into both nostrils daily. Use for 4-6 weeks then stop and use seasonally or as needed. 16 g 0   gabapentin  (NEURONTIN ) 300 MG capsule Take 1 capsule  (300 mg total) by mouth 3 (three) times daily. 90 capsule 2   levETIRAcetam  (KEPPRA ) 500 MG tablet Take 1 tablet (500 mg total)  by mouth 2 (two) times daily. 180 tablet 1   MOUNJARO  10 MG/0.5ML Pen INJECT THE CONTENTS OF ONE PEN  SUBCUTANEOUSLY WEEKLY AS  DIRECTED 6 mL 3   ondansetron  (ZOFRAN -ODT) 4 MG disintegrating tablet DISSOLVE 1 TABLET ON THE TONGUE EVERY 8 HOURS AS NEEDED FOR NAUSEA AND VOMITING 30 tablet 2   oxyCODONE -acetaminophen  (PERCOCET) 10-325 MG tablet Take 1 tablet by mouth every 6 (six) hours as needed for pain.     pantoprazole  (PROTONIX ) 40 MG tablet TAKE 1 TABLET BY MOUTH DAILY AT  9 AM 100 tablet 1   rizatriptan  (MAXALT -MLT) 10 MG disintegrating tablet Take 1 tablet (10 mg total) by mouth as needed. May repeat in 2 hours if needed 10 tablet 2   TRELEGY ELLIPTA  100-62.5-25 MCG/ACT AEPB USE 1 INHALATION BY MOUTH ONCE  DAILY AT THE SAME TIME EACH DAY 180 each 3   leptospermum manuka honey (MEDIHONEY) gel Apply 1 Application topically daily. (Patient not taking: Reported on 01/13/2024) 44 mL 0   naloxone  (NARCAN ) nasal spray 4 mg/0.1 mL Place 1 spray into the nose once. (Patient not taking: Reported on 01/13/2024)     nicotine  (NICODERM CQ  - DOSED IN MG/24 HOURS) 14 mg/24hr patch Place onto the skin. (Patient not taking: Reported on 02/02/2024)     nystatin  (MYCOSTATIN /NYSTOP ) powder Apply 1 Application topically 3 (three) times daily. (Patient not taking: Reported on 01/13/2024) 15 g 0   No current facility-administered medications on file prior to visit.   "

## 2024-02-11 ENCOUNTER — Telehealth (INDEPENDENT_AMBULATORY_CARE_PROVIDER_SITE_OTHER): Payer: Self-pay

## 2024-02-11 NOTE — Telephone Encounter (Signed)
 Spoke with the patient and she is scheduled with Dr. Jama for a LLE angio on 02/16/24 with a 10:30 am arrival time to the [facility]. Pre-procedure instructions were discussed and will be sent to Mychart and mailed. Patient was offered 02/12/24 and declined.

## 2024-02-12 NOTE — Telephone Encounter (Signed)
 Spoke with PT regarding appointment that is scheduled for Tuesday. PT does not understand what the procedure is and what the reason for the procedure. Deanna Daring, NP spoke with PT explaining procedure and reasoning for procedure. PT understood and agreed to procedure.

## 2024-02-16 ENCOUNTER — Telehealth (INDEPENDENT_AMBULATORY_CARE_PROVIDER_SITE_OTHER): Payer: Self-pay

## 2024-02-16 ENCOUNTER — Encounter: Admission: RE | Payer: Self-pay | Source: Home / Self Care

## 2024-02-16 ENCOUNTER — Ambulatory Visit: Admission: RE | Admit: 2024-02-16 | Source: Home / Self Care | Admitting: Vascular Surgery

## 2024-02-16 DIAGNOSIS — I70229 Atherosclerosis of native arteries of extremities with rest pain, unspecified extremity: Secondary | ICD-10-CM

## 2024-02-16 NOTE — Telephone Encounter (Signed)
 Spoke with the patient to reschedule her for a LLE angio with Dr. Jama. Patient stated she had a cold and would call back when she was ready to be rescheduled.

## 2024-02-19 ENCOUNTER — Telehealth: Admitting: Student

## 2024-02-19 ENCOUNTER — Other Ambulatory Visit: Payer: Self-pay | Admitting: Family Medicine

## 2024-02-19 ENCOUNTER — Encounter: Payer: Self-pay | Admitting: Student

## 2024-02-19 ENCOUNTER — Ambulatory Visit: Payer: Self-pay

## 2024-02-19 VITALS — Ht 66.0 in

## 2024-02-19 DIAGNOSIS — G894 Chronic pain syndrome: Secondary | ICD-10-CM

## 2024-02-19 DIAGNOSIS — E782 Mixed hyperlipidemia: Secondary | ICD-10-CM

## 2024-02-19 DIAGNOSIS — R300 Dysuria: Secondary | ICD-10-CM | POA: Insufficient documentation

## 2024-02-19 DIAGNOSIS — J432 Centrilobular emphysema: Secondary | ICD-10-CM

## 2024-02-19 MED ORDER — CEPHALEXIN 500 MG PO CAPS: 500.0000 mg | ORAL_CAPSULE | Freq: Two times a day (BID) | ORAL | 0 refills | Status: AC

## 2024-02-19 MED ORDER — FLUCONAZOLE 150 MG PO TABS: 150.0000 mg | ORAL_TABLET | Freq: Once | ORAL | 0 refills | Status: AC

## 2024-02-19 NOTE — Progress Notes (Signed)
 "  Virtual Visit via Video Note  I connected with [name] on 02/19/24 at  3:20 PM EST by a video enabled telemedicine application and verified that I am speaking with the correct person using two identifiers.  Patient Location: Home Provider Location: Office/Clinic  I discussed the limitations, risks, security, and privacy concerns of performing an evaluation and management service by video and the availability of in person appointments. I also discussed with the patient that there may be a patient responsible charge related to this service. The patient expressed understanding and agreed to proceed.  Subjective: PCP: Edman Marsa PARAS, DO  Chief Complaint  Patient presents with   Urinary Frequency    X 3 days, no blood in the urine, no urine odor, serve burning    Reports dysuria with urination with tmax of 99.18F. Is urinating more frequently. Reports she has hx of UTIs and this feels similar. Denies fever, chills, back pain, hematuria, or n/v/d. Car broke down and is bed bound.   ROS: Per HPI Current Medications[1]  Observations/Objective: Today's Vitals   02/19/24 1510  Height: 5' 6 (1.676 m)   Physical Exam Constitutional:      Appearance: Normal appearance.  HENT:     Head: Normocephalic and atraumatic.  Eyes:     Extraocular Movements: Extraocular movements intact.     Pupils: Pupils are equal, round, and reactive to light.  Pulmonary:     Effort: Pulmonary effort is normal.  Skin:    Capillary Refill: Capillary refill takes less than 2 seconds.  Neurological:     Mental Status: She is alert and oriented to person, place, and time. Mental status is at baseline.     Assessment and Plan: Dysuria Suspect acute cystitis, husband will drop off urine sample for culture at [facility]. Will treat empirically with keflex . Reports she is prone to yeast infections with antibiotics. Will send in fluconazole  150 mg 1 dose. Follow up with PCP if symptoms not  improving.  -     Urine Culture  Other orders -     Cephalexin ; Take 1 capsule (500 mg total) by mouth 2 (two) times daily for 5 days.  Dispense: 10 capsule; Refill: 0 -     Fluconazole ; Take 1 tablet (150 mg total) by mouth once for 1 dose.  Dispense: 1 tablet; Refill: 0    Follow Up Instructions: No follow-ups on file.   I discussed the assessment and treatment plan with the patient. The patient was provided an opportunity to ask questions, and all were answered. The patient agreed with the plan and demonstrated an understanding of the instructions.   The patient was advised to call back or seek an in-person evaluation if the symptoms worsen or if the condition fails to improve as anticipated.  The above assessment and management plan was discussed with the patient. The patient verbalized understanding of and has agreed to the management plan.   Harlene Saddler, MD  I personally spent a total of 22 minutes in the care of the patient today including preparing to see the patient, performing a medically appropriate exam/evaluation, placing orders, and documenting clinical information in the EHR.      [1]  Current Outpatient Medications:    albuterol  (VENTOLIN  HFA) 108 (90 Base) MCG/ACT inhaler, INHALE 2 INHALATIONS BY MOUTH  EVERY 4 HOURS AS NEEDED FOR  WHEEZING OR FOR SHORTNESS OF  BREATH, Disp: 51 g, Rfl: 1   atorvastatin  (LIPITOR) 40 MG tablet, Take 1 tablet (40  mg total) by mouth at bedtime., Disp: 90 tablet, Rfl: 1   bisacodyl  5 MG EC tablet, Take 2 tablets (10 mg total) by mouth daily as needed for moderate constipation., Disp: 30 tablet, Rfl: 0   cephALEXin  (KEFLEX ) 500 MG capsule, Take 1 capsule (500 mg total) by mouth 2 (two) times daily for 5 days., Disp: 10 capsule, Rfl: 0   clopidogrel  (PLAVIX ) 75 MG tablet, TAKE 1 TABLET BY MOUTH DAILY AT  9AM, Disp: 100 tablet, Rfl: 2   fluconazole  (DIFLUCAN ) 150 MG tablet, Take 1 tablet (150 mg total) by mouth once for 1 dose., Disp: 1  tablet, Rfl: 0   fluticasone  (FLONASE ) 50 MCG/ACT nasal spray, Place 2 sprays into both nostrils daily. Use for 4-6 weeks then stop and use seasonally or as needed., Disp: 16 g, Rfl: 0   gabapentin  (NEURONTIN ) 300 MG capsule, Take 1 capsule (300 mg total) by mouth 3 (three) times daily., Disp: 90 capsule, Rfl: 2   levETIRAcetam  (KEPPRA ) 500 MG tablet, Take 1 tablet (500 mg total) by mouth 2 (two) times daily., Disp: 180 tablet, Rfl: 1   MOUNJARO  10 MG/0.5ML Pen, INJECT THE CONTENTS OF ONE PEN  SUBCUTANEOUSLY WEEKLY AS  DIRECTED, Disp: 6 mL, Rfl: 3   naloxone  (NARCAN ) nasal spray 4 mg/0.1 mL, Place 1 spray into the nose once., Disp: , Rfl:    ondansetron  (ZOFRAN -ODT) 4 MG disintegrating tablet, DISSOLVE 1 TABLET ON THE TONGUE EVERY 8 HOURS AS NEEDED FOR NAUSEA AND VOMITING, Disp: 30 tablet, Rfl: 2   oxyCODONE -acetaminophen  (PERCOCET) 10-325 MG tablet, Take 1 tablet by mouth every 6 (six) hours as needed for pain., Disp: , Rfl:    pantoprazole  (PROTONIX ) 40 MG tablet, TAKE 1 TABLET BY MOUTH DAILY AT  9 AM, Disp: 100 tablet, Rfl: 1   rizatriptan  (MAXALT -MLT) 10 MG disintegrating tablet, Take 1 tablet (10 mg total) by mouth as needed. May repeat in 2 hours if needed, Disp: 10 tablet, Rfl: 2   TRELEGY ELLIPTA  100-62.5-25 MCG/ACT AEPB, USE 1 INHALATION BY MOUTH ONCE  DAILY AT THE SAME TIME EACH DAY, Disp: 180 each, Rfl: 3   DULoxetine  (CYMBALTA ) 30 MG capsule, Take 1 capsule (30 mg total) by mouth daily. (Patient not taking: Reported on 02/19/2024), Disp: 30 capsule, Rfl: 2   leptospermum manuka honey (MEDIHONEY) gel, Apply 1 Application topically daily. (Patient not taking: Reported on 02/19/2024), Disp: 44 mL, Rfl: 0   nicotine  (NICODERM CQ  - DOSED IN MG/24 HOURS) 14 mg/24hr patch, Place onto the skin. (Patient not taking: Reported on 02/19/2024), Disp: , Rfl:    nystatin  (MYCOSTATIN /NYSTOP ) powder, Apply 1 Application topically 3 (three) times daily. (Patient not taking: Reported on 02/19/2024), Disp: 15 g, Rfl:  0  "

## 2024-02-19 NOTE — Telephone Encounter (Signed)
 FYI Only or Action Required?: FYI only for provider: appointment scheduled on VV 2/6.  Patient was last seen in primary care on 01/27/2024 by Edman Marsa PARAS, DO.  Called Nurse Triage reporting Dysuria.  Symptoms began several days ago.  Interventions attempted: Prescription medications: Percocet.  Symptoms are: gradually worsening.  Triage Disposition: See HCP Within 4 Hours (Or PCP Triage)  Patient/caregiver understands and will follow disposition?: Yes    Burning with urination and frequency since 2 days ago. 101 F Temp. No flank or back pain. No bloody or cloudy urine. Pt states she is bed bound and would not be able to come in for appt. Given pts age of less than 28 and mobility limitations, pt scheduled for virtual visit today. No apts with any provider at home office today. Scheduled with provider at a different office. Pt has access to mychart to do VV. Advised UC or ED for worsening symptoms.     Message from [place] sent at 02/19/2024 11:41 AM EST  Reason for Triage: UTI- burning, fever, frequent urination. Patient is requesting Levofloxacin  (Levaquin ) and Diflucan  (fluconazole )  Pharmacy: [facility] - [place], [place] - [address] [place] [address] Phone: [phone] Fax: [phone] Hours: Not open 24 hours   Reason for Disposition  [1] SEVERE pain with urination (e.g., excruciating) AND [2] not improved after 2 hours of pain medicine  Answer Assessment - Initial Assessment Questions 1. SEVERITY: How bad is the pain?  (e.g., Scale 1-10; mild, moderate, or severe)     8/10 burning pain  2. FREQUENCY: How many times have you had painful urination today?      Urinary frequency 3. PATTERN: Is pain present every time you urinate or just sometimes?      Yes 4. ONSET: When did the painful urination start?      2 days ago 5. FEVER: Do you have a fever? If Yes, ask: What is your temperature, how was it measured, and when did  it start?     101 F Temp 6. PAST UTI: Have you had a urine infection before? If Yes, ask: When was the last time? and What happened that time?      Yes, takes abxs 7. CAUSE: What do you think is causing the painful urination?  (e.g., UTI, scratch, Herpes sore)     UTI 8. OTHER SYMPTOMS: Do you have any other symptoms? (e.g., blood in urine, flank pain, genital sores, urgency, vaginal discharge)     No  Protocols used: Urination Pain - Female-A-AH

## 2024-05-27 ENCOUNTER — Ambulatory Visit

## 2024-06-01 ENCOUNTER — Ambulatory Visit
